# Patient Record
Sex: Female | Born: 1950 | Race: Black or African American | Hispanic: No | Marital: Married | Smoking: Former smoker
Health system: Southern US, Community
[De-identification: ages and names within clinical notes are randomized; demographics above are authoritative.]

## PROBLEM LIST (undated history)

## (undated) DIAGNOSIS — Z7901 Long term (current) use of anticoagulants: Secondary | ICD-10-CM

## (undated) DIAGNOSIS — J189 Pneumonia, unspecified organism: Secondary | ICD-10-CM

## (undated) DIAGNOSIS — E119 Type 2 diabetes mellitus without complications: Secondary | ICD-10-CM

## (undated) DIAGNOSIS — R58 Hemorrhage, not elsewhere classified: Secondary | ICD-10-CM

## (undated) DIAGNOSIS — J42 Unspecified chronic bronchitis: Secondary | ICD-10-CM

## (undated) DIAGNOSIS — T4145XA Adverse effect of unspecified anesthetic, initial encounter: Secondary | ICD-10-CM

## (undated) DIAGNOSIS — I10 Essential (primary) hypertension: Secondary | ICD-10-CM

## (undated) DIAGNOSIS — J40 Bronchitis, not specified as acute or chronic: Secondary | ICD-10-CM

## (undated) DIAGNOSIS — I4891 Unspecified atrial fibrillation: Secondary | ICD-10-CM

## (undated) DIAGNOSIS — E669 Obesity, unspecified: Secondary | ICD-10-CM

## (undated) DIAGNOSIS — F32A Depression, unspecified: Secondary | ICD-10-CM

## (undated) DIAGNOSIS — I5082 Biventricular heart failure: Secondary | ICD-10-CM

## (undated) DIAGNOSIS — N183 Chronic kidney disease, stage 3 unspecified: Secondary | ICD-10-CM

## (undated) DIAGNOSIS — R569 Unspecified convulsions: Secondary | ICD-10-CM

## (undated) DIAGNOSIS — K219 Gastro-esophageal reflux disease without esophagitis: Secondary | ICD-10-CM

## (undated) DIAGNOSIS — I639 Cerebral infarction, unspecified: Secondary | ICD-10-CM

## (undated) DIAGNOSIS — F439 Reaction to severe stress, unspecified: Secondary | ICD-10-CM

## (undated) DIAGNOSIS — B3731 Acute candidiasis of vulva and vagina: Secondary | ICD-10-CM

## (undated) DIAGNOSIS — G4733 Obstructive sleep apnea (adult) (pediatric): Secondary | ICD-10-CM

## (undated) DIAGNOSIS — D649 Anemia, unspecified: Secondary | ICD-10-CM

## (undated) DIAGNOSIS — I509 Heart failure, unspecified: Secondary | ICD-10-CM

## (undated) DIAGNOSIS — N184 Chronic kidney disease, stage 4 (severe): Secondary | ICD-10-CM

## (undated) DIAGNOSIS — Z9289 Personal history of other medical treatment: Secondary | ICD-10-CM

## (undated) DIAGNOSIS — R0602 Shortness of breath: Secondary | ICD-10-CM

## (undated) DIAGNOSIS — N200 Calculus of kidney: Secondary | ICD-10-CM

## (undated) DIAGNOSIS — F419 Anxiety disorder, unspecified: Secondary | ICD-10-CM

## (undated) DIAGNOSIS — L409 Psoriasis, unspecified: Secondary | ICD-10-CM

## (undated) DIAGNOSIS — I87309 Chronic venous hypertension (idiopathic) without complications of unspecified lower extremity: Secondary | ICD-10-CM

## (undated) DIAGNOSIS — I5030 Unspecified diastolic (congestive) heart failure: Secondary | ICD-10-CM

## (undated) DIAGNOSIS — B373 Candidiasis of vulva and vagina: Secondary | ICD-10-CM

## (undated) DIAGNOSIS — M109 Gout, unspecified: Secondary | ICD-10-CM

## (undated) DIAGNOSIS — F329 Major depressive disorder, single episode, unspecified: Secondary | ICD-10-CM

## (undated) DIAGNOSIS — T8859XA Other complications of anesthesia, initial encounter: Secondary | ICD-10-CM

## (undated) DIAGNOSIS — E785 Hyperlipidemia, unspecified: Secondary | ICD-10-CM

## (undated) DIAGNOSIS — Z9981 Dependence on supplemental oxygen: Secondary | ICD-10-CM

## (undated) DIAGNOSIS — M1990 Unspecified osteoarthritis, unspecified site: Secondary | ICD-10-CM

## (undated) HISTORY — PX: LUMBAR DISC SURGERY: SHX700

## (undated) HISTORY — PX: BACK SURGERY: SHX140

## (undated) HISTORY — DX: Obstructive sleep apnea (adult) (pediatric): G47.33

## (undated) HISTORY — DX: Acute candidiasis of vulva and vagina: B37.31

## (undated) HISTORY — DX: Reaction to severe stress, unspecified: F43.9

## (undated) HISTORY — DX: Chronic kidney disease, stage 3 unspecified: N18.30

## (undated) HISTORY — DX: Depression, unspecified: F32.A

## (undated) HISTORY — PX: JOINT REPLACEMENT: SHX530

## (undated) HISTORY — DX: Calculus of kidney: N20.0

## (undated) HISTORY — PX: TONSILLECTOMY: SUR1361

## (undated) HISTORY — PX: TOTAL ABDOMINAL HYSTERECTOMY: SHX209

## (undated) HISTORY — DX: Essential (primary) hypertension: I10

## (undated) HISTORY — DX: Anemia, unspecified: D64.9

## (undated) HISTORY — PX: APPENDECTOMY: SHX54

## (undated) HISTORY — DX: Cerebral infarction, unspecified: I63.9

## (undated) HISTORY — DX: Long term (current) use of anticoagulants: Z79.01

## (undated) HISTORY — DX: Psoriasis, unspecified: L40.9

## (undated) HISTORY — PX: CYSTECTOMY: SHX5119

## (undated) HISTORY — PX: CHOLECYSTECTOMY: SHX55

## (undated) HISTORY — DX: Biventricular heart failure: I50.82

## (undated) HISTORY — DX: Chronic venous hypertension (idiopathic) without complications of unspecified lower extremity: I87.309

## (undated) HISTORY — DX: Unspecified convulsions: R56.9

## (undated) HISTORY — DX: Obesity, unspecified: E66.9

## (undated) HISTORY — PX: TOTAL KNEE ARTHROPLASTY: SHX125

## (undated) HISTORY — DX: Chronic kidney disease, stage 3 (moderate): N18.3

## (undated) HISTORY — DX: Gout, unspecified: M10.9

## (undated) HISTORY — DX: Unspecified osteoarthritis, unspecified site: M19.90

## (undated) HISTORY — DX: Bronchitis, not specified as acute or chronic: J40

## (undated) HISTORY — DX: Unspecified atrial fibrillation: I48.91

## (undated) HISTORY — DX: Candidiasis of vulva and vagina: B37.3

## (undated) HISTORY — DX: Hyperlipidemia, unspecified: E78.5

## (undated) HISTORY — DX: Major depressive disorder, single episode, unspecified: F32.9

---

## 1998-02-03 ENCOUNTER — Ambulatory Visit (HOSPITAL_COMMUNITY): Admission: RE | Admit: 1998-02-03 | Discharge: 1998-02-03 | Payer: Self-pay | Admitting: Internal Medicine

## 1998-04-21 ENCOUNTER — Inpatient Hospital Stay (HOSPITAL_COMMUNITY): Admission: EM | Admit: 1998-04-21 | Discharge: 1998-04-28 | Payer: Self-pay | Admitting: *Deleted

## 1998-07-13 ENCOUNTER — Ambulatory Visit (HOSPITAL_COMMUNITY): Admission: RE | Admit: 1998-07-13 | Discharge: 1998-07-13 | Payer: Self-pay | Admitting: Internal Medicine

## 1998-10-16 ENCOUNTER — Ambulatory Visit (HOSPITAL_COMMUNITY): Admission: RE | Admit: 1998-10-16 | Discharge: 1998-10-16 | Payer: Self-pay | Admitting: Internal Medicine

## 1998-10-21 ENCOUNTER — Emergency Department (HOSPITAL_COMMUNITY): Admission: EM | Admit: 1998-10-21 | Discharge: 1998-10-21 | Payer: Self-pay | Admitting: Emergency Medicine

## 1999-01-15 ENCOUNTER — Ambulatory Visit (HOSPITAL_COMMUNITY): Admission: RE | Admit: 1999-01-15 | Discharge: 1999-01-15 | Payer: Self-pay | Admitting: Urology

## 1999-01-24 ENCOUNTER — Encounter: Payer: Self-pay | Admitting: Urology

## 1999-01-26 ENCOUNTER — Encounter: Payer: Self-pay | Admitting: Urology

## 1999-01-26 ENCOUNTER — Ambulatory Visit (HOSPITAL_COMMUNITY): Admission: RE | Admit: 1999-01-26 | Discharge: 1999-01-26 | Payer: Self-pay | Admitting: Urology

## 1999-02-12 ENCOUNTER — Encounter: Payer: Self-pay | Admitting: Urology

## 1999-02-12 ENCOUNTER — Ambulatory Visit (HOSPITAL_COMMUNITY): Admission: RE | Admit: 1999-02-12 | Discharge: 1999-02-12 | Payer: Self-pay | Admitting: Urology

## 1999-03-20 ENCOUNTER — Ambulatory Visit (HOSPITAL_COMMUNITY): Admission: RE | Admit: 1999-03-20 | Discharge: 1999-03-20 | Payer: Self-pay | Admitting: Emergency Medicine

## 1999-03-20 ENCOUNTER — Encounter: Payer: Self-pay | Admitting: Emergency Medicine

## 1999-03-22 ENCOUNTER — Ambulatory Visit (HOSPITAL_COMMUNITY): Admission: RE | Admit: 1999-03-22 | Discharge: 1999-03-22 | Payer: Self-pay | Admitting: Urology

## 1999-03-22 ENCOUNTER — Encounter: Payer: Self-pay | Admitting: Urology

## 1999-03-25 ENCOUNTER — Inpatient Hospital Stay (HOSPITAL_COMMUNITY): Admission: EM | Admit: 1999-03-25 | Discharge: 1999-03-27 | Payer: Self-pay | Admitting: Emergency Medicine

## 1999-03-25 ENCOUNTER — Encounter: Payer: Self-pay | Admitting: Urology

## 1999-03-26 ENCOUNTER — Encounter: Payer: Self-pay | Admitting: Urology

## 1999-05-17 ENCOUNTER — Ambulatory Visit (HOSPITAL_COMMUNITY): Admission: RE | Admit: 1999-05-17 | Discharge: 1999-05-17 | Payer: Self-pay | Admitting: Urology

## 1999-05-17 ENCOUNTER — Encounter: Payer: Self-pay | Admitting: Urology

## 1999-05-24 ENCOUNTER — Ambulatory Visit (HOSPITAL_COMMUNITY): Admission: RE | Admit: 1999-05-24 | Discharge: 1999-05-24 | Payer: Self-pay | Admitting: Urology

## 1999-05-24 ENCOUNTER — Encounter: Payer: Self-pay | Admitting: Urology

## 1999-06-04 ENCOUNTER — Encounter: Payer: Self-pay | Admitting: Orthopedic Surgery

## 1999-06-07 ENCOUNTER — Inpatient Hospital Stay (HOSPITAL_COMMUNITY): Admission: RE | Admit: 1999-06-07 | Discharge: 1999-06-11 | Payer: Self-pay | Admitting: Orthopedic Surgery

## 1999-06-11 ENCOUNTER — Inpatient Hospital Stay (HOSPITAL_COMMUNITY)
Admission: RE | Admit: 1999-06-11 | Discharge: 1999-06-20 | Payer: Self-pay | Admitting: Physical Medicine and Rehabilitation

## 1999-07-18 ENCOUNTER — Ambulatory Visit (HOSPITAL_COMMUNITY): Admission: RE | Admit: 1999-07-18 | Discharge: 1999-07-18 | Payer: Self-pay | Admitting: Orthopedic Surgery

## 1999-10-02 ENCOUNTER — Ambulatory Visit (HOSPITAL_COMMUNITY): Admission: RE | Admit: 1999-10-02 | Discharge: 1999-10-02 | Payer: Self-pay | Admitting: Orthopedic Surgery

## 1999-10-03 ENCOUNTER — Inpatient Hospital Stay (HOSPITAL_COMMUNITY): Admission: AD | Admit: 1999-10-03 | Discharge: 1999-10-10 | Payer: Self-pay | Admitting: Orthopedic Surgery

## 1999-10-03 ENCOUNTER — Encounter: Payer: Self-pay | Admitting: Orthopedic Surgery

## 1999-11-05 ENCOUNTER — Encounter: Admission: RE | Admit: 1999-11-05 | Discharge: 2000-01-25 | Payer: Self-pay | Admitting: Orthopedic Surgery

## 1999-11-13 ENCOUNTER — Encounter: Admission: RE | Admit: 1999-11-13 | Discharge: 1999-11-13 | Payer: Self-pay | Admitting: Internal Medicine

## 1999-12-18 ENCOUNTER — Encounter: Admission: RE | Admit: 1999-12-18 | Discharge: 1999-12-18 | Payer: Self-pay | Admitting: Internal Medicine

## 2000-01-07 ENCOUNTER — Encounter: Payer: Self-pay | Admitting: Urology

## 2000-01-07 ENCOUNTER — Encounter: Admission: RE | Admit: 2000-01-07 | Discharge: 2000-01-07 | Payer: Self-pay | Admitting: Urology

## 2000-01-15 ENCOUNTER — Encounter: Admission: RE | Admit: 2000-01-15 | Discharge: 2000-01-15 | Payer: Self-pay | Admitting: Internal Medicine

## 2000-01-22 ENCOUNTER — Encounter: Payer: Self-pay | Admitting: Internal Medicine

## 2000-01-22 ENCOUNTER — Ambulatory Visit (HOSPITAL_COMMUNITY): Admission: RE | Admit: 2000-01-22 | Discharge: 2000-01-22 | Payer: Self-pay | Admitting: Internal Medicine

## 2000-03-06 ENCOUNTER — Encounter: Admission: RE | Admit: 2000-03-06 | Discharge: 2000-06-04 | Payer: Self-pay | Admitting: Anesthesiology

## 2000-03-18 ENCOUNTER — Encounter: Admission: RE | Admit: 2000-03-18 | Discharge: 2000-03-18 | Payer: Self-pay | Admitting: Internal Medicine

## 2000-04-22 ENCOUNTER — Encounter (INDEPENDENT_AMBULATORY_CARE_PROVIDER_SITE_OTHER): Payer: Self-pay | Admitting: *Deleted

## 2000-04-22 ENCOUNTER — Ambulatory Visit (HOSPITAL_COMMUNITY): Admission: RE | Admit: 2000-04-22 | Discharge: 2000-04-22 | Payer: Self-pay | Admitting: Gastroenterology

## 2000-05-13 ENCOUNTER — Encounter: Admission: RE | Admit: 2000-05-13 | Discharge: 2000-05-13 | Payer: Self-pay | Admitting: Internal Medicine

## 2000-05-16 ENCOUNTER — Encounter: Payer: Self-pay | Admitting: Cardiovascular Disease

## 2000-05-16 ENCOUNTER — Ambulatory Visit (HOSPITAL_COMMUNITY): Admission: RE | Admit: 2000-05-16 | Discharge: 2000-05-16 | Payer: Self-pay | Admitting: Cardiovascular Disease

## 2000-05-18 ENCOUNTER — Emergency Department (HOSPITAL_COMMUNITY): Admission: EM | Admit: 2000-05-18 | Discharge: 2000-05-18 | Payer: Self-pay | Admitting: Internal Medicine

## 2000-05-18 ENCOUNTER — Encounter: Payer: Self-pay | Admitting: Emergency Medicine

## 2000-05-24 ENCOUNTER — Emergency Department (HOSPITAL_COMMUNITY): Admission: EM | Admit: 2000-05-24 | Discharge: 2000-05-24 | Payer: Self-pay | Admitting: Emergency Medicine

## 2000-05-24 ENCOUNTER — Encounter: Payer: Self-pay | Admitting: Emergency Medicine

## 2000-06-09 ENCOUNTER — Encounter: Admission: RE | Admit: 2000-06-09 | Discharge: 2000-06-09 | Payer: Self-pay | Admitting: Internal Medicine

## 2000-07-21 ENCOUNTER — Encounter: Admission: RE | Admit: 2000-07-21 | Discharge: 2000-07-21 | Payer: Self-pay | Admitting: Internal Medicine

## 2000-07-29 ENCOUNTER — Encounter: Admission: RE | Admit: 2000-07-29 | Discharge: 2000-07-29 | Payer: Self-pay | Admitting: Internal Medicine

## 2000-08-13 ENCOUNTER — Encounter: Admission: RE | Admit: 2000-08-13 | Discharge: 2000-08-13 | Payer: Self-pay | Admitting: Internal Medicine

## 2000-09-03 ENCOUNTER — Encounter: Admission: RE | Admit: 2000-09-03 | Discharge: 2000-09-03 | Payer: Self-pay | Admitting: Internal Medicine

## 2000-09-09 ENCOUNTER — Emergency Department (HOSPITAL_COMMUNITY): Admission: EM | Admit: 2000-09-09 | Discharge: 2000-09-09 | Payer: Self-pay | Admitting: Emergency Medicine

## 2000-09-30 ENCOUNTER — Inpatient Hospital Stay (HOSPITAL_COMMUNITY): Admission: RE | Admit: 2000-09-30 | Discharge: 2000-10-07 | Payer: Self-pay | Admitting: Orthopedic Surgery

## 2000-09-30 ENCOUNTER — Encounter (INDEPENDENT_AMBULATORY_CARE_PROVIDER_SITE_OTHER): Payer: Self-pay | Admitting: Specialist

## 2000-10-02 ENCOUNTER — Encounter: Payer: Self-pay | Admitting: Orthopedic Surgery

## 2000-10-07 ENCOUNTER — Inpatient Hospital Stay (HOSPITAL_COMMUNITY)
Admission: RE | Admit: 2000-10-07 | Discharge: 2000-10-14 | Payer: Self-pay | Admitting: Physical Medicine & Rehabilitation

## 2000-10-07 ENCOUNTER — Encounter: Payer: Self-pay | Admitting: Internal Medicine

## 2000-10-07 ENCOUNTER — Encounter: Payer: Self-pay | Admitting: Physical Medicine and Rehabilitation

## 2000-10-07 ENCOUNTER — Encounter (INDEPENDENT_AMBULATORY_CARE_PROVIDER_SITE_OTHER): Payer: Self-pay | Admitting: *Deleted

## 2000-10-13 ENCOUNTER — Encounter: Payer: Self-pay | Admitting: Physical Medicine & Rehabilitation

## 2000-11-18 ENCOUNTER — Encounter: Admission: RE | Admit: 2000-11-18 | Discharge: 2000-11-18 | Payer: Self-pay | Admitting: Internal Medicine

## 2001-01-20 ENCOUNTER — Encounter: Admission: RE | Admit: 2001-01-20 | Discharge: 2001-01-20 | Payer: Self-pay | Admitting: Internal Medicine

## 2001-01-22 ENCOUNTER — Ambulatory Visit (HOSPITAL_COMMUNITY): Admission: RE | Admit: 2001-01-22 | Discharge: 2001-01-22 | Payer: Self-pay | Admitting: Internal Medicine

## 2001-01-22 ENCOUNTER — Encounter: Payer: Self-pay | Admitting: Internal Medicine

## 2001-04-04 ENCOUNTER — Emergency Department (HOSPITAL_COMMUNITY): Admission: EM | Admit: 2001-04-04 | Discharge: 2001-04-04 | Payer: Self-pay

## 2001-07-01 ENCOUNTER — Encounter: Payer: Self-pay | Admitting: Urology

## 2001-07-01 ENCOUNTER — Encounter: Admission: RE | Admit: 2001-07-01 | Discharge: 2001-07-01 | Payer: Self-pay | Admitting: Urology

## 2001-07-08 ENCOUNTER — Encounter: Payer: Self-pay | Admitting: Orthopedic Surgery

## 2001-07-14 ENCOUNTER — Encounter (INDEPENDENT_AMBULATORY_CARE_PROVIDER_SITE_OTHER): Payer: Self-pay | Admitting: Specialist

## 2001-07-14 ENCOUNTER — Inpatient Hospital Stay (HOSPITAL_COMMUNITY): Admission: RE | Admit: 2001-07-14 | Discharge: 2001-07-21 | Payer: Self-pay | Admitting: Orthopedic Surgery

## 2001-08-19 ENCOUNTER — Encounter: Admission: RE | Admit: 2001-08-19 | Discharge: 2001-11-17 | Payer: Self-pay | Admitting: Orthopedic Surgery

## 2001-11-17 ENCOUNTER — Encounter: Payer: Self-pay | Admitting: Internal Medicine

## 2001-11-17 ENCOUNTER — Encounter: Admission: RE | Admit: 2001-11-17 | Discharge: 2001-11-17 | Payer: Self-pay | Admitting: Internal Medicine

## 2001-11-18 ENCOUNTER — Encounter: Admission: RE | Admit: 2001-11-18 | Discharge: 2001-11-19 | Payer: Self-pay | Admitting: Orthopedic Surgery

## 2002-01-22 ENCOUNTER — Encounter: Admission: RE | Admit: 2002-01-22 | Discharge: 2002-01-22 | Payer: Self-pay | Admitting: Internal Medicine

## 2002-01-22 ENCOUNTER — Encounter: Payer: Self-pay | Admitting: Internal Medicine

## 2002-01-26 ENCOUNTER — Ambulatory Visit (HOSPITAL_COMMUNITY): Admission: RE | Admit: 2002-01-26 | Discharge: 2002-01-26 | Payer: Self-pay | Admitting: Internal Medicine

## 2002-01-26 ENCOUNTER — Encounter: Payer: Self-pay | Admitting: Internal Medicine

## 2002-03-20 ENCOUNTER — Emergency Department (HOSPITAL_COMMUNITY): Admission: EM | Admit: 2002-03-20 | Discharge: 2002-03-20 | Payer: Self-pay

## 2002-06-13 ENCOUNTER — Emergency Department (HOSPITAL_COMMUNITY): Admission: EM | Admit: 2002-06-13 | Discharge: 2002-06-13 | Payer: Self-pay | Admitting: Emergency Medicine

## 2002-06-13 ENCOUNTER — Encounter: Payer: Self-pay | Admitting: Emergency Medicine

## 2002-10-13 ENCOUNTER — Encounter: Payer: Self-pay | Admitting: Emergency Medicine

## 2002-10-13 ENCOUNTER — Emergency Department (HOSPITAL_COMMUNITY): Admission: EM | Admit: 2002-10-13 | Discharge: 2002-10-13 | Payer: Self-pay | Admitting: Emergency Medicine

## 2002-10-20 ENCOUNTER — Inpatient Hospital Stay (HOSPITAL_COMMUNITY): Admission: AD | Admit: 2002-10-20 | Discharge: 2002-10-27 | Payer: Self-pay | Admitting: Internal Medicine

## 2002-10-21 ENCOUNTER — Encounter: Payer: Self-pay | Admitting: Internal Medicine

## 2002-10-21 ENCOUNTER — Encounter (INDEPENDENT_AMBULATORY_CARE_PROVIDER_SITE_OTHER): Payer: Self-pay | Admitting: Cardiovascular Disease

## 2002-10-22 ENCOUNTER — Encounter: Payer: Self-pay | Admitting: Internal Medicine

## 2002-11-20 ENCOUNTER — Emergency Department (HOSPITAL_COMMUNITY): Admission: EM | Admit: 2002-11-20 | Discharge: 2002-11-20 | Payer: Self-pay | Admitting: Emergency Medicine

## 2003-01-28 ENCOUNTER — Encounter: Payer: Self-pay | Admitting: Internal Medicine

## 2003-01-28 ENCOUNTER — Ambulatory Visit (HOSPITAL_COMMUNITY): Admission: RE | Admit: 2003-01-28 | Discharge: 2003-01-28 | Payer: Self-pay | Admitting: Internal Medicine

## 2003-03-23 ENCOUNTER — Inpatient Hospital Stay (HOSPITAL_COMMUNITY): Admission: AD | Admit: 2003-03-23 | Discharge: 2003-03-25 | Payer: Self-pay | Admitting: Internal Medicine

## 2003-05-09 ENCOUNTER — Encounter: Payer: Self-pay | Admitting: Internal Medicine

## 2003-05-09 ENCOUNTER — Ambulatory Visit (HOSPITAL_BASED_OUTPATIENT_CLINIC_OR_DEPARTMENT_OTHER): Admission: RE | Admit: 2003-05-09 | Discharge: 2003-05-09 | Payer: Self-pay | Admitting: Internal Medicine

## 2003-07-20 ENCOUNTER — Emergency Department (HOSPITAL_COMMUNITY): Admission: EM | Admit: 2003-07-20 | Discharge: 2003-07-20 | Payer: Self-pay | Admitting: Emergency Medicine

## 2003-07-20 ENCOUNTER — Encounter: Payer: Self-pay | Admitting: Emergency Medicine

## 2003-12-05 ENCOUNTER — Emergency Department (HOSPITAL_COMMUNITY): Admission: EM | Admit: 2003-12-05 | Discharge: 2003-12-05 | Payer: Self-pay | Admitting: Emergency Medicine

## 2004-01-28 ENCOUNTER — Emergency Department (HOSPITAL_COMMUNITY): Admission: EM | Admit: 2004-01-28 | Discharge: 2004-01-28 | Payer: Self-pay | Admitting: Emergency Medicine

## 2004-02-04 ENCOUNTER — Emergency Department (HOSPITAL_COMMUNITY): Admission: EM | Admit: 2004-02-04 | Discharge: 2004-02-04 | Payer: Self-pay | Admitting: Emergency Medicine

## 2004-02-13 ENCOUNTER — Ambulatory Visit (HOSPITAL_COMMUNITY): Admission: RE | Admit: 2004-02-13 | Discharge: 2004-02-13 | Payer: Self-pay | Admitting: Internal Medicine

## 2004-03-05 ENCOUNTER — Encounter: Admission: RE | Admit: 2004-03-05 | Discharge: 2004-03-05 | Payer: Self-pay | Admitting: Internal Medicine

## 2004-03-27 ENCOUNTER — Ambulatory Visit: Admission: RE | Admit: 2004-03-27 | Discharge: 2004-03-27 | Payer: Self-pay | Admitting: Cardiology

## 2004-03-27 ENCOUNTER — Encounter (INDEPENDENT_AMBULATORY_CARE_PROVIDER_SITE_OTHER): Payer: Self-pay | Admitting: Cardiology

## 2004-04-12 ENCOUNTER — Emergency Department (HOSPITAL_COMMUNITY): Admission: EM | Admit: 2004-04-12 | Discharge: 2004-04-12 | Payer: Self-pay | Admitting: *Deleted

## 2004-04-17 ENCOUNTER — Encounter: Admission: RE | Admit: 2004-04-17 | Discharge: 2004-04-17 | Payer: Self-pay | Admitting: Internal Medicine

## 2004-06-20 ENCOUNTER — Ambulatory Visit (HOSPITAL_COMMUNITY): Admission: RE | Admit: 2004-06-20 | Discharge: 2004-06-20 | Payer: Self-pay | Admitting: Cardiology

## 2004-09-18 ENCOUNTER — Inpatient Hospital Stay (HOSPITAL_BASED_OUTPATIENT_CLINIC_OR_DEPARTMENT_OTHER): Admission: RE | Admit: 2004-09-18 | Discharge: 2004-09-18 | Payer: Self-pay | Admitting: Cardiology

## 2004-12-26 ENCOUNTER — Encounter: Admission: RE | Admit: 2004-12-26 | Discharge: 2004-12-26 | Payer: Self-pay | Admitting: Internal Medicine

## 2005-01-03 ENCOUNTER — Emergency Department (HOSPITAL_COMMUNITY): Admission: EM | Admit: 2005-01-03 | Discharge: 2005-01-03 | Payer: Self-pay | Admitting: Emergency Medicine

## 2005-02-15 ENCOUNTER — Ambulatory Visit (HOSPITAL_COMMUNITY): Admission: RE | Admit: 2005-02-15 | Discharge: 2005-02-15 | Payer: Self-pay | Admitting: Internal Medicine

## 2005-04-30 ENCOUNTER — Encounter: Admission: RE | Admit: 2005-04-30 | Discharge: 2005-04-30 | Payer: Self-pay | Admitting: Internal Medicine

## 2005-06-15 ENCOUNTER — Emergency Department (HOSPITAL_COMMUNITY): Admission: EM | Admit: 2005-06-15 | Discharge: 2005-06-15 | Payer: Self-pay | Admitting: Emergency Medicine

## 2005-11-01 ENCOUNTER — Emergency Department (HOSPITAL_COMMUNITY): Admission: EM | Admit: 2005-11-01 | Discharge: 2005-11-01 | Payer: Self-pay | Admitting: Family Medicine

## 2005-11-06 ENCOUNTER — Emergency Department (HOSPITAL_COMMUNITY): Admission: EM | Admit: 2005-11-06 | Discharge: 2005-11-06 | Payer: Self-pay | Admitting: Emergency Medicine

## 2005-11-08 ENCOUNTER — Inpatient Hospital Stay (HOSPITAL_COMMUNITY): Admission: AD | Admit: 2005-11-08 | Discharge: 2005-11-13 | Payer: Self-pay | Admitting: Internal Medicine

## 2006-02-21 ENCOUNTER — Ambulatory Visit (HOSPITAL_COMMUNITY): Admission: RE | Admit: 2006-02-21 | Discharge: 2006-02-21 | Payer: Self-pay | Admitting: Internal Medicine

## 2006-05-12 ENCOUNTER — Ambulatory Visit (HOSPITAL_COMMUNITY): Admission: RE | Admit: 2006-05-12 | Discharge: 2006-05-12 | Payer: Self-pay

## 2006-05-12 ENCOUNTER — Encounter (INDEPENDENT_AMBULATORY_CARE_PROVIDER_SITE_OTHER): Payer: Self-pay | Admitting: Specialist

## 2006-07-02 ENCOUNTER — Emergency Department (HOSPITAL_COMMUNITY): Admission: EM | Admit: 2006-07-02 | Discharge: 2006-07-02 | Payer: Self-pay | Admitting: Family Medicine

## 2006-08-21 ENCOUNTER — Encounter: Admission: RE | Admit: 2006-08-21 | Discharge: 2006-08-21 | Payer: Self-pay | Admitting: Internal Medicine

## 2006-10-28 ENCOUNTER — Inpatient Hospital Stay (HOSPITAL_COMMUNITY): Admission: RE | Admit: 2006-10-28 | Discharge: 2006-11-03 | Payer: Self-pay | Admitting: Neurosurgery

## 2007-02-23 ENCOUNTER — Ambulatory Visit (HOSPITAL_COMMUNITY): Admission: RE | Admit: 2007-02-23 | Discharge: 2007-02-23 | Payer: Self-pay | Admitting: Internal Medicine

## 2007-07-24 ENCOUNTER — Encounter: Admission: RE | Admit: 2007-07-24 | Discharge: 2007-07-24 | Payer: Self-pay | Admitting: Neurology

## 2008-02-24 ENCOUNTER — Ambulatory Visit (HOSPITAL_COMMUNITY): Admission: RE | Admit: 2008-02-24 | Discharge: 2008-02-24 | Payer: Self-pay | Admitting: Internal Medicine

## 2008-08-31 ENCOUNTER — Encounter: Admission: RE | Admit: 2008-08-31 | Discharge: 2008-08-31 | Payer: Self-pay | Admitting: Internal Medicine

## 2009-01-01 ENCOUNTER — Emergency Department (HOSPITAL_COMMUNITY): Admission: EM | Admit: 2009-01-01 | Discharge: 2009-01-01 | Payer: Self-pay | Admitting: Emergency Medicine

## 2009-02-24 ENCOUNTER — Ambulatory Visit (HOSPITAL_COMMUNITY): Admission: RE | Admit: 2009-02-24 | Discharge: 2009-02-24 | Payer: Self-pay | Admitting: Internal Medicine

## 2009-05-03 ENCOUNTER — Encounter (HOSPITAL_COMMUNITY): Admission: RE | Admit: 2009-05-03 | Discharge: 2009-07-12 | Payer: Self-pay | Admitting: Cardiology

## 2009-06-23 ENCOUNTER — Emergency Department (HOSPITAL_COMMUNITY): Admission: EM | Admit: 2009-06-23 | Discharge: 2009-06-23 | Payer: Self-pay | Admitting: Emergency Medicine

## 2009-10-08 ENCOUNTER — Emergency Department (HOSPITAL_COMMUNITY): Admission: EM | Admit: 2009-10-08 | Discharge: 2009-10-08 | Payer: Self-pay | Admitting: Emergency Medicine

## 2010-01-08 ENCOUNTER — Observation Stay (HOSPITAL_COMMUNITY): Admission: EM | Admit: 2010-01-08 | Discharge: 2010-01-08 | Payer: Self-pay | Admitting: Emergency Medicine

## 2010-01-15 ENCOUNTER — Emergency Department (HOSPITAL_COMMUNITY): Admission: EM | Admit: 2010-01-15 | Discharge: 2010-01-16 | Payer: Self-pay | Admitting: Emergency Medicine

## 2010-01-15 ENCOUNTER — Encounter: Payer: Self-pay | Admitting: Internal Medicine

## 2010-02-09 ENCOUNTER — Encounter: Payer: Self-pay | Admitting: Internal Medicine

## 2010-03-15 ENCOUNTER — Ambulatory Visit: Payer: Self-pay | Admitting: Internal Medicine

## 2010-03-15 DIAGNOSIS — J42 Unspecified chronic bronchitis: Secondary | ICD-10-CM | POA: Insufficient documentation

## 2010-03-15 DIAGNOSIS — G4733 Obstructive sleep apnea (adult) (pediatric): Secondary | ICD-10-CM

## 2010-03-16 ENCOUNTER — Ambulatory Visit (HOSPITAL_COMMUNITY): Admission: RE | Admit: 2010-03-16 | Discharge: 2010-03-16 | Payer: Self-pay | Admitting: Internal Medicine

## 2010-03-18 DIAGNOSIS — E785 Hyperlipidemia, unspecified: Secondary | ICD-10-CM

## 2010-03-18 DIAGNOSIS — J45909 Unspecified asthma, uncomplicated: Secondary | ICD-10-CM | POA: Insufficient documentation

## 2010-03-18 DIAGNOSIS — I10 Essential (primary) hypertension: Secondary | ICD-10-CM | POA: Insufficient documentation

## 2010-03-18 DIAGNOSIS — I6789 Other cerebrovascular disease: Secondary | ICD-10-CM

## 2010-03-22 ENCOUNTER — Encounter: Payer: Self-pay | Admitting: Internal Medicine

## 2010-03-23 ENCOUNTER — Encounter: Payer: Self-pay | Admitting: Internal Medicine

## 2010-03-28 ENCOUNTER — Encounter: Payer: Self-pay | Admitting: Internal Medicine

## 2010-04-09 ENCOUNTER — Encounter: Payer: Self-pay | Admitting: Internal Medicine

## 2010-04-20 ENCOUNTER — Ambulatory Visit: Payer: Self-pay | Admitting: Internal Medicine

## 2010-04-20 DIAGNOSIS — E662 Morbid (severe) obesity with alveolar hypoventilation: Secondary | ICD-10-CM

## 2010-05-28 ENCOUNTER — Inpatient Hospital Stay (HOSPITAL_COMMUNITY): Admission: EM | Admit: 2010-05-28 | Discharge: 2010-06-05 | Payer: Self-pay | Admitting: Emergency Medicine

## 2010-05-29 ENCOUNTER — Encounter (INDEPENDENT_AMBULATORY_CARE_PROVIDER_SITE_OTHER): Payer: Self-pay | Admitting: Internal Medicine

## 2010-08-03 ENCOUNTER — Encounter: Payer: Self-pay | Admitting: Internal Medicine

## 2010-08-03 ENCOUNTER — Ambulatory Visit: Payer: Self-pay | Admitting: Internal Medicine

## 2010-08-03 ENCOUNTER — Inpatient Hospital Stay (HOSPITAL_COMMUNITY): Admission: EM | Admit: 2010-08-03 | Discharge: 2010-08-21 | Payer: Self-pay | Admitting: Emergency Medicine

## 2010-10-17 ENCOUNTER — Ambulatory Visit: Payer: Self-pay | Admitting: Internal Medicine

## 2010-10-17 DIAGNOSIS — I4891 Unspecified atrial fibrillation: Secondary | ICD-10-CM

## 2010-10-17 DIAGNOSIS — I5041 Acute combined systolic (congestive) and diastolic (congestive) heart failure: Secondary | ICD-10-CM

## 2010-10-26 ENCOUNTER — Encounter: Payer: Self-pay | Admitting: Internal Medicine

## 2010-11-02 ENCOUNTER — Telehealth: Payer: Self-pay | Admitting: Internal Medicine

## 2010-11-09 ENCOUNTER — Encounter: Admission: RE | Admit: 2010-11-09 | Discharge: 2010-11-09 | Payer: Self-pay | Admitting: Internal Medicine

## 2010-11-22 ENCOUNTER — Ambulatory Visit: Payer: Self-pay | Admitting: Internal Medicine

## 2011-01-08 NOTE — Letter (Signed)
Summary: Consult/[facility]  [facility]   Imported By: Sherian Rein 03/21/2010 10:26:28  _____________________________________________________________________  External Attachment:    Type:   Image     Comment:   External Document

## 2011-01-08 NOTE — Letter (Signed)
Summary: CMN for CPAP & Supplies/[facility]  CMN for CPAP & Supplies/[facility]   Imported By: Sherian Rein 04/11/2010 11:01:15  _____________________________________________________________________  External Attachment:    Type:   Image     Comment:   External Document

## 2011-01-08 NOTE — Assessment & Plan Note (Signed)
Summary: 1 MONTH W/PFT/APC   Primary Provider/Referring Provider:  Willey Blade  CC:  1 month follow up visit-Review PFT results today. [name]  History of Present Illness: History of Present Illness: 03/23/2010- 59 yoF, previously a patient at [facility] for obstructive sleep apnea, . Comes now for pulmonary evaluation. She has had several ER visits for cough, and had allergy evaluation by Dr [place] Callas, with recommendation that she see a pulmonologist.  She reports itching and watering of eyes, drainage and sneeze, but skin testing was reportedly negative. She was prescribed Astepro, Xyzal, Singulair, Xopenex HFA , albuterol by neb, and omeprazole. She says she wasn't able to afford most of this. CXR in ER was negative. She is aware of dx asthma/ bronchitis. NPSG 04/11/03- moderate OSA, AHI 17.9. She was fitted with CPAP 7, but uses it intermittently.  Apr 20, 2010- OSA, Asthma/ bronchitis She has CPAP at 7 used every night, but nocturia gets in the way and sometimes she doesn't replace cpap after returning to bed. It seems to be helping and she will keep trying  PFT - moderate restriction c/w obesity hypoventilation. There is a little response to bronchodilator in small airways. DLCO is reduced- nonspecific. Room air sat today 93%. CXR- 01/15/10- , clear.      Current Medications (verified): 1)  Crestor 20 Mg Tabs (Rosuvastatin Calcium) .... Take 1 By Mouth Once Daily 2)  Potassium Chloride 20 Meq Pack (Potassium Chloride) .... Take 1 By Mouth Once Daily 3)  Potassium Citrate 10 Meq (1080 Mg) Cr-Tabs (Potassium Citrate) .... Take 1 By Mouth Three Times A Day 4)  Furosemide 40 Mg Tabs (Furosemide) .... Take 1 By Mouth Once Daily 5)  Methotrexate 2.5 Mg Tabs (Methotrexate Sodium) .... Take 4 By Mouth Every Week 6)  Zyprexa 5 Mg Tabs (Olanzapine) .... Take 1 By Mouth At Bedtime 7)  Benicar Hct 40-25 Mg Tabs (Olmesartan Medoxomil-Hctz) .... Take 1 By Mouth Once Daily 8)  Folic Acid 1 Mg Tabs (Folic Acid)  .... Take 1 By Mouth Once Daily 9)  Aspirin 81 Mg Tbec (Aspirin) .... Take 1 By Mouth Once Daily 10)  Isosorbide Mononitrate Cr 120 Mg Xr24h-Tab (Isosorbide Mononitrate) .... Take 1 By Mouth Once Daily 11)  Metoprolol Succinate 100 Mg Xr24h-Tab (Metoprolol Succinate) .... Take 1/2 By Mouth Two Times A Day 12)  Tribenzor 40-5-25 Mg Tabs (Olmesartan-Amlodipine-Hctz) .... Take 1 By Mouth Once Daily  Allergies (verified): 1)  ! Sulfa 2)  ! Codeine 3)  ! Pcn 4)  ! * Emycin 5)  ! * Latex 6)  ! Prozac 7)  ! Vioxx 8)  ! 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 Hypertension Obstructive Sleep Apnea- NPSG 04/11/03 AHI 17.9/hr Degenerative joint disease Hx kidney stones depression latex allergy - hx- undefined Hyperlipidemia  Past Surgical History: Last updated: 2010-03-23 Appendectomy Cholecystectomy Total Knee Arthroplasty- left Total Abdominal Hysterectomy Back surgery Left forearm repair - infected trauma cyst left hand  Family History: Last updated: 03/23/2010 Father-deceased from massive MI, asthma Uncle Deceased from heart disease Aunt -rectal cancer Mother- died MVA  Social History: Last updated: 03-23-10 Married with children, 7 grands, 1 great-grand disabled- 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  The patient  denies anorexia, fever, weight loss, weight gain, vision loss, decreased hearing, hoarseness, chest pain, syncope, peripheral edema, prolonged cough, headaches, hemoptysis, abdominal pain, melena, and severe indigestion/heartburn.    Vital Signs:  Patient profile:   60 year old female Height:      63 inches Weight:      299 pounds BMI:     53.16 O2 Sat:      93 % on Room air Pulse rate:   81 / minute BP  sitting:   122 / 78  (right arm) Cuff size:   large  Vitals Entered By: Reynaldo Minium CMA (Apr 20, 2010 2:25 PM)  O2 Flow:  Room air CC: 1 month follow up visit-Review PFT results today.  Comments BP taken on Right wrist.Katie Welchel CMA  Apr 20, 2010 2:25 PM    Physical Exam  Additional Exam:  General: A/Ox3; pleasant and cooperative, NAD,  morbidly obese SKIN: no rash, lesions NODES: no lymphadenopathy HEENT: [place]/AT, EOM- WNL, Conjuctivae- clear, PERRLA, TM-WNL, Nose- clear, Throat- clear and wnl, Mallampati  III NECK: Supple w/ fair ROM, JVD- none, normal carotid impulses w/o bruits Thyroid- normal to palpation, no stridor CHEST: Clear to P&A, diminished HEART: RRR, no m/g/r heard ABDOMEN: Soft and nl;  ZOX:WRUE, nl pulses, 1-2+ edema , pretibial stasis changes. Scar left forearm NEURO: Grossly intact to observation      Impression & Recommendations:  Problem # 1:  OBSTRUCTIVE SLEEP APNEA (ICD-327.23)  She needs ongoing encouragement to be compliant with CPAP, especially with nocturia getting her up at night., the basic set-up seems effective.  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 I tried to be encouraging.  There may be enough of a bronchospasm to justify occasional use of a bronchodilator spray, but it isn't evident now.  Medications Added to Medication List This Visit: 1)  Tribenzor 40-5-25 Mg Tabs (Olmesartan-amlodipine-hctz) .... Take 1 by mouth once daily 2)  Cpap 7 [facility]   Other Orders: Est. Patient Level III (45409)  Patient Instructions: 1)  Please schedule a follow-up appointment in 6 months. 2)  Keep trying to use your CPAP all night, every night 3)  It is really important for you to lose weight so your lungs have more room to fill with air. You will feel much better.

## 2011-01-08 NOTE — Assessment & Plan Note (Signed)
Summary: asthma/apc   Primary Provider/Referring Provider:  Willey Blade  CC:  Pulmonary consult-Dr.Sharma.  History of Present Illness: 29-Mar-2010- 59 yoF, previously a patient at [facility] for obstructive sleep apnea, . Comes now for pulmonary evaluation. She has had several ER visits for cough, and had allergy evaluation by Dr [place] Callas, with recommendation that she see a pulmonologist.  she reports itching and watering of eyes, drainage and sneeze, but skin testing was reportedly negative. She was prescribed Astepro, Xyzal, Singulair, Xopenex HFA , albuterol by neb, and omeprazole. She says she wasn't able to afford most of this. CXR in ER was negative. She is aware of dx asthma/ bronchitis. NPSG 04/11/03- moderate OSA, AHI 17.9. She was fitted with CPAP 7, but uses it intermittently.  Allergies (verified): 1)  ! Sulfa 2)  ! Codeine 3)  ! Pcn 4)  ! * Emycin 5)  ! 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  The patient denies productive cough, coughing up blood, chest pain, loss of appetite, weight change, abdominal pain, sore throat, headaches, itching, ear ache, change in color of mucus, and fever.         Sleeps during day whenever she is quiet Nocturia  Vital Signs:  Patient profile:   60 year old female Height:      63 inches Weight:      299 pounds BMI:     53.16 O2 Sat:      96 % on Room air Pulse rate:   62 / minute BP sitting:   140 / 88  (left arm) Cuff size:   large  Vitals Entered By: Reynaldo Minium CMA 03-29-2010 10:15 AM)  O2 Flow:  Room air  Physical Exam  Additional Exam:  General: A/Ox3; pleasant and cooperative, NAD, obese SKIN: no rash, lesions NODES: no lymphadenopathy HEENT: [place]/AT, EOM- WNL, Conjuctivae- clear, PERRLA, TM-WNL, Nose- clear, Throat- clear and wnl, Mallampati  III NECK: Supple w/ fair ROM, JVD- none, normal carotid impulses w/o bruits Thyroid- normal to palpation, no stridor CHEST: Clear to P&A, diminished HEART: RRR, no m/g/r heard ABDOMEN: Soft and nl;  FTD:DUKG, nl pulses, 1-2+ edema , pretibial stasis changes. Scar left forearm NEURO: Grossly intact to observation  Impression & Recommendations:  Problem # 1:  BRONCHITIS, RECURRENT (ICD-491.9)  We will get PFT, find report of most recent CXR and have her return bringing meds so we can review these. She is encouraged to be more active.  Problem # 2:  OBSTRUCTIVE SLEEP APNEA (ICD-327.23)  She had mild to moderate OSA in  2004 and has tried to use CPAP but admits inconsistency. Nocturia and daytime somnolence may relate to this problem. We will autotitrate for pressure check and get [facility] to look at her machine. She realizes weight loss is important.  Other Orders: Consultation Level III (16109) DME Referral (DME)  Patient Instructions: 1)  Please schedule a follow-up appointment in 1 month. When you come back, please bring all your medicines in a bag so we can go over them. 2)  See [facility] to arrange autotitration of your CPAP machine to check pressure 3)  Walk as much as you can to help stamina and weight loss. When you do sit, try to elevate your feet to help with the swelling. 4)  Schedule PFT

## 2011-01-08 NOTE — Assessment & Plan Note (Signed)
Summary: rov 6 months///kp   Primary Macallister Ashmead/Referring Carylon Tamburro:  Willey Blade  CC:  6 mth rov - sob worse for 2 weeks - diff sleeping - wears cpap until 2 amd then takes it off - swelling about the same.  History of Present Illness: March 15, 2010- 59 yoF, previously a patient at [facility] for obstructive sleep apnea, . Comes now for pulmonary evaluation. She has had several ER visits for cough, and had allergy evaluation by Dr [place] Callas, with recommendation that she see a pulmonologist.  She reports itching and watering of eyes, drainage and sneeze, but skin testing was reportedly negative. She was prescribed Astepro, Xyzal, Singulair, Xopenex HFA , albuterol by neb, and omeprazole. She says she wasn't able to afford most of this. CXR in ER was negative. She is aware of dx asthma/ bronchitis. NPSG 04/11/03- moderate OSA, AHI 17.9. She was fitted with CPAP 7, but uses it intermittently.  Apr 20, 2010- OSA, Asthma/ bronchitis She has CPAP at 7 used every night, but nocturia gets in the way and sometimes she doesn't replace cpap after returning to bed. It seems to be helping and she will keep trying  PFT - moderate restriction c/w obesity hypoventilation. There is a little response to bronchodilator in small airways. DLCO is reduced- nonspecific. Room air sat today 93%. CXR- 01/15/10- , clear.  October 17, 2010- OSA, Asthma/ bronchitis, daistolic CHF, Atrial flutter/ fib Nurse-CC: 6 mth rov - sob worse for 2 weeks - diff sleeping - wears cpap until 2 amd then takes it off - swelling about the same Falls asleep w/ CPAP but cant sleep after up to bathroom so sits up. CPAP 7 [facility]. Hosp in August for acute respiratory failure, diastolic heart failure, atrial flutter. Better initially but breathing "up and down" since. Dr Sharyn Lull- cards.  More SOB with some dry cough, 2 weeks. Denies chest pain. Occ palpitation. Leg swelling "about same".  CT 08/03/10- CE, coronary calcification, suspected elevated right heart  pressures, borderline mediastinal adenopathy.   Asthma History    Initial Asthma Severity Rating:    Age range: 12+ years    Symptoms: throughout the day    Nighttime Awakenings: >1/week but not nightly    Interferes w/ normal activity: some limitations    SABA use (not for EIB): several times per day    Asthma Severity Assessment: Severe Persistent   Preventive Screening-Counseling & Management  Alcohol-Tobacco     Smoking Status: quit  Current Medications (verified): 1)  Crestor 20 Mg Tabs (Rosuvastatin Calcium) .... Take 1 By Mouth Once Daily 2)  Potassium Citrate 10 Meq (1080 Mg) Cr-Tabs (Potassium Citrate) .... Take 1 By Mouth Three Times A Day 3)  Furosemide 40 Mg Tabs (Furosemide) .... Take 1and 1/2 By Mouth Once Daily 4)  Methotrexate 2.5 Mg Tabs (Methotrexate Sodium) .... Take 4 By Mouth Every Week 5)  Zyprexa 5 Mg Tabs (Olanzapine) .... Take 1 By Mouth At Bedtime 6)  Metoprolol Tartrate 50 Mg Tabs (Metoprolol Tartrate) .... Take One By Mouth Three Times A Day 7)  Cpap 7 [facility] 8)  Taztia Xt 360 Mg Xr24h-Cap (Diltiazem Hcl Er Beads) .... Take One By Mouth Once Daily 9)  Spironolactone 25 Mg Tabs (Spironolactone) .... Take One By Mouth Once Daily 10)  Zolpidem Tartrate 5 Mg Tabs (Zolpidem Tartrate) .... Take One By Mouth At Bedtime  As Needed 11)  Warfarin Sodium 4 Mg Tabs (Warfarin Sodium) .... Take 2 Tab By Mouth Once Daily 12)  Loratadine  10 Mg Tabs (Loratadine) .... Take One By Mouth Once Daily 13)  Promethazine Hcl 12.5 Mg Tabs (Promethazine Hcl) .... Take One By Mouth Every 8 Hrs As Needed Nausea 14)  Vitamin D3 50000 Unit Caps (Cholecalciferol) .... Take One By Mouth Once A Week 15)  Flovent Hfa 220 Mcg/act Aero (Fluticasone Propionate  Hfa) .... Take 2 Puffs Three Times A Day 16)  Ventolin Hfa 108 (90 Base) Mcg/act Aers (Albuterol Sulfate) .... Take 2 Puffs Every 4 Hrs As Needed  Allergies (verified): 1)  ! Sulfa 2)  ! Codeine 3)  ! Pcn 4)  ! * Emycin 5)  ! *  Latex 6)  ! Prozac 7)  ! Vioxx 8)  ! 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 Hypertension Obstructive Sleep Apnea- NPSG 04/11/03 AHI 17.9/hr Degenerative joint disease Hx kidney stones depression latex allergy - hx- undefined Hyperlipidemia  Past Surgical History: Last updated: 25-Mar-2010 Appendectomy Cholecystectomy Total Knee Arthroplasty- left Total Abdominal Hysterectomy Back surgery Left forearm repair - infected trauma cyst left hand  Family History: Last updated: 03/25/2010 Father-deceased from massive MI, asthma Uncle Deceased from heart disease Aunt -rectal cancer Mother- died MVA  Social History: Last updated: 10/17/2010 Married with children, 7 grands, 1 great-grand disabled- 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  The patient denies anorexia, fever, weight loss, weight gain, vision loss, decreased hearing, hoarseness, chest pain, syncope, prolonged cough, headaches, hemoptysis, abdominal pain, abnormal bleeding, and enlarged lymph nodes.    Vital Signs:  Patient profile:   60 year old female Height:      63 inches Weight:      310.25 pounds BMI:     55.16 O2 Sat:      94 % on Room air Pulse rate:   136 / minute BP sitting:   150 / 90  (left arm) Cuff size:   large  Vitals Entered By: Abigail Miyamoto RN (October 17, 2010 11:11 AM)  O2 Flow:  Room air  Physical Exam  Additional Exam:  General: A/Ox3; pleasant and  cooperative, NAD,  morbidly obese SKIN: no rash, lesions NODES: no lymphadenopathy HEENT: [place]/AT, EOM- WNL, Conjuctivae- clear, PERRLA, TM-WNL, Nose- clear, Throat- clear and wnl, Mallampati  III NECK: Supple w/ fair ROM, JVD- none, normal carotid impulses w/o bruits Thyroid- normal to palpation, no stridor CHEST: Clear to P&A, diminished, shallow, labored, no cough or wheeze HEART: rapid rhythm, approx regular 136/min ABDOMEN: Soft and nl; morbidly obese ZOX:WRUE, nl pulses,3+edema , pretibial stasis changes. Scar left forearm NEURO: Grossly intact to observation      Impression & Recommendations:  Problem # 1:  OBSTRUCTIVE SLEEP APNEA (ICD-327.23) Atrial fib w/ RVR @ 136/min. Room air sat is 94%.  She is dyspneic from heart and that is limiting CPAP tolerance. We will check ONOX and see if home O2 could help for sleep.   Problem # 2:  ATRIAL FIBRILLATION WITH RAPID VENTRICULAR RESPONSE (ICD-427.31) Heart rate  of 136 indicates inadequate control, especially since she was just in the hospital. We are calling to see if we can get her over to Dr Sharyn Lull for cardiology assessment.  The following medications were removed from the medication list:    Aspirin 81 Mg Tbec (Aspirin) .[name]... Take 1 by mouth once daily Her updated medication list for this problem includes:    Metoprolol Tartrate 50 Mg Tabs (Metoprolol tartrate) .[name]... Take one by mouth three times a day    Warfarin Sodium 4 Mg Tabs (Warfarin sodium) .[name]... Take 2 tab by mouth once daily  Medications Added to Medication List This Visit: 1)  Furosemide 40 Mg Tabs (Furosemide) .... Take 1and 1/2 by mouth once daily 2)  Metoprolol Tartrate 50 Mg Tabs (Metoprolol tartrate) .... Take one by mouth three times a day 3)  Taztia Xt 360 Mg Xr24h-cap (Diltiazem hcl er beads) .... Take one by mouth once daily 4)  Spironolactone 25 Mg Tabs (Spironolactone) .... Take one by mouth once daily 5)  Zolpidem Tartrate 5 Mg Tabs (Zolpidem tartrate) ....  Take one by mouth at bedtime  as needed 6)  Warfarin Sodium 4 Mg Tabs (Warfarin sodium) .... Take 2 tab by mouth once daily 7)  Loratadine 10 Mg Tabs (Loratadine) .... Take one by mouth once daily 8)  Promethazine Hcl 12.5 Mg Tabs (Promethazine hcl) .... Take one by mouth every 8 hrs as needed nausea 9)  Vitamin D3 50000 Unit Caps (Cholecalciferol) .... Take one by mouth once a week 10)  Flovent Hfa 220 Mcg/act Aero (Fluticasone propionate  hfa) .... Take 2 puffs three times a day 11)  Ventolin Hfa 108 (90 Base) Mcg/act Aers (Albuterol sulfate) .... Take 2 puffs every 4 hrs as needed  Other Orders: Est. Patient Level IV (16109) DME Referral (DME)  Patient Instructions: 1)  Please schedule a follow-up appointment in 1 month. 2)  Early appointment with Dr Sharyn Lull for atrial fib with rapid response at 136 3)  We will have [facility] set up an overnight oximetry on CPAP    Influenza Immunization History:    Influenza # 1:  Historical (10/10/2010)

## 2011-01-08 NOTE — Progress Notes (Signed)
Summary: ONOX CPAP/ Room Air OK= no home O2  Phone Note Other Incoming   Summary of Call: ONOX- CPAP/ room air 10/25/10- Oxygenation within accepted range. Time w/ sat </= 88% for 2 minutes, 44 seconds. She doesn't qualify for home oxygen during sleep. Initial call taken by: Waymon Budge MD,  November 02, 2010 6:42 PM

## 2011-01-08 NOTE — Letter (Signed)
Summary: CMN for CPAP Supplies/[facility]  CMN for CPAP Supplies/[facility]   Imported By: Sherian Rein 04/11/2010 10:58:57  _____________________________________________________________________  External Attachment:    Type:   Image     Comment:   External Document

## 2011-01-08 NOTE — Miscellaneous (Signed)
Summary: Orders Update pft charges  Clinical Lists Changes  Orders: Added new Service order of Carbon Monoxide diffusing w/capacity (94720) - Signed Added new Service order of Lung Volumes (94240) - Signed Added new Service order of Spirometry (Pre & Post) (94060) - Signed 

## 2011-01-08 NOTE — Miscellaneous (Signed)
Summary: Order Confirmation for CPAP/[facility]  Order Confirmation for CPAP/[facility]   Imported By: Sherian Rein 03/29/2010 14:30:09  _____________________________________________________________________  External Attachment:    Type:   Image     Comment:   External Document

## 2011-01-08 NOTE — Miscellaneous (Signed)
Summary: Update med list/allergies/kcw  Clinical Lists Changes  Medications: Added new medication of CRESTOR 20 MG TABS (ROSUVASTATIN CALCIUM) take 1 by mouth once daily Added new medication of POTASSIUM CHLORIDE 20 MEQ PACK (POTASSIUM CHLORIDE) take 1 by mouth once daily Added new medication of POTASSIUM CITRATE 10 MEQ (1080 MG) CR-TABS (POTASSIUM CITRATE) take 1 by mouth three times a day Added new medication of FUROSEMIDE 40 MG TABS (FUROSEMIDE) take 1 by mouth once daily Added new medication of METHOTREXATE 2.5 MG TABS (METHOTREXATE SODIUM) take 4 by mouth every week Added new medication of FELODIPINE 10 MG XR24H-TAB (FELODIPINE) take 1 by mouth once daily Added new medication of TEKTURNA 150 MG TABS (ALISKIREN FUMARATE) take 2 by mouth once daily Added new medication of ZYPREXA 5 MG TABS (OLANZAPINE) take 1 by mouth at bedtime Added new medication of BENICAR HCT 40-25 MG TABS (OLMESARTAN MEDOXOMIL-HCTZ) take 1 by mouth once daily Added new medication of FOLIC ACID 1 MG TABS (FOLIC ACID) take 1 by mouth once daily Added new medication of ASPIRIN 81 MG TBEC (ASPIRIN) take 1 by mouth once daily Added new medication of ISOSORBIDE MONONITRATE CR 120 MG XR24H-TAB (ISOSORBIDE MONONITRATE) take 1 by mouth once daily Added new medication of METOPROLOL SUCCINATE 100 MG XR24H-TAB (METOPROLOL SUCCINATE) take 1/2 by mouth two times a day  Appended Document: Update med list/allergies/kcw    Clinical Lists Changes  Allergies: Added new allergy or adverse reaction of PROZAC Added new allergy or adverse reaction of VIOXX Added new allergy or adverse reaction of CELEBREX

## 2011-01-08 NOTE — Procedures (Signed)
Summary: Oximetry/[facility]  Oximetry/[facility]   Imported By: Sherian Rein 11/08/2010 08:22:44  _____________________________________________________________________  External Attachment:    Type:   Image     Comment:   External Document

## 2011-01-10 NOTE — Assessment & Plan Note (Signed)
Summary: 1 mth rov/ddp   Primary Provider/Referring Provider:  Willey Blade  CC:  1 month follow up visit-OSA using CPAP each night and SOB.[name]  History of Present Illness: Apr 20, 2010- OSA, Asthma/ bronchitis She has CPAP at 7 used every night, but nocturia gets in the way and sometimes she doesn't replace cpap after returning to bed. It seems to be helping and she will keep trying  PFT - moderate restriction c/w obesity hypoventilation. There is a little response to bronchodilator in small airways. DLCO is reduced- nonspecific. Room air sat today 93%. CXR- 01/15/10- , clear.  October 17, 2010- OSA, Asthma/ bronchitis, daistolic CHF, Atrial flutter/ fib Nurse-CC: 6 mth rov - sob worse for 2 weeks - diff sleeping - wears cpap until 2 amd then takes it off - swelling about the same Falls asleep w/ CPAP but cant sleep after up to bathroom so sits up. CPAP 7 [employer]. Hosp in August for acute respiratory failure, diastolic heart failure, atrial flutter. Better initially but breathing "up and down" since. Dr Sharyn Lull- cards.  More SOB with some dry cough, 2 weeks. Denies chest pain. Occ palpitation. Leg swelling "about same".  CT 08/03/10- CE, coronary calcification, suspected elevated right heart pressures, borderline mediastinal adenopathy.  November 22, 2010- OSA, Asthma/ bronchitis, daistolic CHF, Atrial flutter/ fib Nurse-CC: 1 month follow up visit-OSA using CPAP each night and SOB. She is doing well with CPAP, using every night at 7 and says she feels better. Husband doesn't use his CPAP- his snoring drones her to sleep.  Asthma control ok- no wheeze but admits dyspnea with exertion on sustained walking.       Asthma History    Asthma Control Assessment:    Age range: 12+ years    Symptoms: 0-2 days/week    Nighttime Awakenings: 0-2/month    Interferes w/ normal activity: no limitations    SABA use (not for EIB): >2 days/week    Asthma Control Assessment: Not Well  Controlled   Preventive Screening-Counseling & Management  Alcohol-Tobacco     Smoking Status: quit     Packs/Day: <0.25     Year Quit: 1986  Current Medications (verified): 1)  Crestor 20 Mg Tabs (Rosuvastatin Calcium) .... Take 1 By Mouth Once Daily 2)  Potassium Citrate 10 Meq (1080 Mg) Cr-Tabs (Potassium Citrate) .... Take 1 By Mouth Three Times A Day 3)  Furosemide 40 Mg Tabs (Furosemide) .... Take 1and 1/2 By Mouth Once Daily 4)  Methotrexate 2.5 Mg Tabs (Methotrexate Sodium) .... Take 4 By Mouth Every Week 5)  Zyprexa 5 Mg Tabs (Olanzapine) .... Take 1 By Mouth At Bedtime 6)  Metoprolol Tartrate 50 Mg Tabs (Metoprolol Tartrate) .... Take One By Mouth Three Times A Day 7)  Cpap 7 [employer] 8)  Taztia Xt 360 Mg Xr24h-Cap (Diltiazem Hcl Er Beads) .... Take One By Mouth Once Daily 9)  Spironolactone 25 Mg Tabs (Spironolactone) .... Take One By Mouth Once Daily 10)  Zolpidem Tartrate 5 Mg Tabs (Zolpidem Tartrate) .... Take One By Mouth At Bedtime  As Needed 11)  Warfarin Sodium 4 Mg Tabs (Warfarin Sodium) .... Take 2 Tab By Mouth Once Daily 12)  Loratadine 10 Mg Tabs (Loratadine) .... Take One By Mouth Once Daily 13)  Promethazine Hcl 12.5 Mg Tabs (Promethazine Hcl) .... Take One By Mouth Every 8 Hrs As Needed Nausea 14)  Vitamin D3 50000 Unit Caps (Cholecalciferol) .... Take One By Mouth Once A Week 15)  Flovent Hfa 220 Mcg/act  Aero (Fluticasone Propionate  Hfa) .... Take 2 Puffs Three Times A Day 16)  Ventolin Hfa 108 (90 Base) Mcg/act Aers (Albuterol Sulfate) .... Take 2 Puffs Every 4 Hrs As Needed  Allergies (verified): 1)  ! Sulfa 2)  ! Codeine 3)  ! Pcn 4)  ! * Emycin 5)  ! * Latex 6)  ! Prozac 7)  ! Vioxx 8)  ! 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 Hypertension Obstructive Sleep Apnea- NPSG 04/11/03 AHI 17.9/hr Degenerative joint disease Hx kidney  stones depression latex allergy - hx- undefined Hyperlipidemia  Past Surgical History: Last updated: 03-25-10 Appendectomy Cholecystectomy Total Knee Arthroplasty- left Total Abdominal Hysterectomy Back surgery Left forearm repair - infected trauma cyst left hand  Family History: Last updated: 03-25-10 Father-deceased from massive MI, asthma Uncle Deceased from heart disease Aunt -rectal cancer Mother- died MVA  Social History: Last updated: 11/22/2010 Married with children, 7 grands, 1 great-grand disabled- 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  The patient denies shortness of breath at rest, productive cough, non-productive cough, coughing up blood, chest pain, acid heartburn, indigestion, loss of appetite, weight change, abdominal pain, difficulty swallowing, sore throat, tooth/dental problems, headaches, nasal congestion/difficulty breathing through nose, and sneezing.    Vital Signs:  Patient profile:   60 year old female Height:      63 inches Weight:      303.38 pounds BMI:     53.94 O2 Sat:      91 % on Room air Pulse rate:   70 / minute BP sitting:   146 / 62  (right arm) Cuff size:   large  Vitals Entered By: Reynaldo Minium CMA (November 22, 2010 4:29 PM)  O2 Flow:  Room air CC: 1 month follow up visit-OSA using CPAP each night and SOB.   Physical Exam  Additional Exam:  General: A/Ox3; pleasant and cooperative, NAD,  morbidly obese, O2 sat 91% at rest.  SKIN: no rash, lesions NODES:  no lymphadenopathy HEENT: [place]/AT, EOM- WNL, Conjuctivae- clear, PERRLA, TM-WNL, Nose- clear, Throat- clear and wnl, Mallampati  III NECK: Supple w/ fair ROM, JVD- none, normal carotid impulses w/o bruits Thyroid- normal to palpation, no stridor CHEST: Clear to P&A, diminished, shallow, unlabored, no cough or wheeze, no rales HEART: regular today 70/ min, no murmur ABDOMEN: Soft and nl; morbidly obese ZOX:WRUE, nl pulses,3+edema , pretibial stasis changes. Scar left forearm NEURO: Grossly intact to observation      Impression & Recommendations:  Problem # 1:  OBSTRUCTIVE SLEEP APNEA (ICD-327.23)  Good compliance and control with CPAP. She is doing well. Answered questions about mask replacement and humidifier.  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 Will try to encourage her.   Problem # 3:  ATRIAL FIBRILLATION WITH RAPID VENTRICULAR RESPONSE (ICD-427.31) Cardiac status certainly contributes to exercise limitation, but she is in sinus  rhythm today.  Her updated medication list for this problem includes:    Metoprolol Tartrate 50 Mg Tabs (Metoprolol tartrate) .[name]... Take one by mouth three times a day    Warfarin Sodium 4 Mg Tabs (Warfarin sodium) .[name]... Take 2 tab by mouth once daily  Other Orders: Est. Patient Level IV (40102)  Patient Instructions: 1)  Please schedule a follow-up appointment in 4 months. 2)  Continue CPAP at 7.

## 2011-02-14 ENCOUNTER — Other Ambulatory Visit: Payer: Self-pay | Admitting: Internal Medicine

## 2011-02-14 DIAGNOSIS — Z1231 Encounter for screening mammogram for malignant neoplasm of breast: Secondary | ICD-10-CM

## 2011-02-21 LAB — GLUCOSE, CAPILLARY
Glucose-Capillary: 113 mg/dL — ABNORMAL HIGH (ref 70–99)
Glucose-Capillary: 113 mg/dL — ABNORMAL HIGH (ref 70–99)
Glucose-Capillary: 114 mg/dL — ABNORMAL HIGH (ref 70–99)
Glucose-Capillary: 118 mg/dL — ABNORMAL HIGH (ref 70–99)
Glucose-Capillary: 121 mg/dL — ABNORMAL HIGH (ref 70–99)
Glucose-Capillary: 122 mg/dL — ABNORMAL HIGH (ref 70–99)
Glucose-Capillary: 123 mg/dL — ABNORMAL HIGH (ref 70–99)
Glucose-Capillary: 133 mg/dL — ABNORMAL HIGH (ref 70–99)
Glucose-Capillary: 137 mg/dL — ABNORMAL HIGH (ref 70–99)
Glucose-Capillary: 140 mg/dL — ABNORMAL HIGH (ref 70–99)
Glucose-Capillary: 142 mg/dL — ABNORMAL HIGH (ref 70–99)
Glucose-Capillary: 143 mg/dL — ABNORMAL HIGH (ref 70–99)
Glucose-Capillary: 144 mg/dL — ABNORMAL HIGH (ref 70–99)
Glucose-Capillary: 149 mg/dL — ABNORMAL HIGH (ref 70–99)
Glucose-Capillary: 155 mg/dL — ABNORMAL HIGH (ref 70–99)
Glucose-Capillary: 157 mg/dL — ABNORMAL HIGH (ref 70–99)
Glucose-Capillary: 158 mg/dL — ABNORMAL HIGH (ref 70–99)
Glucose-Capillary: 159 mg/dL — ABNORMAL HIGH (ref 70–99)
Glucose-Capillary: 163 mg/dL — ABNORMAL HIGH (ref 70–99)
Glucose-Capillary: 163 mg/dL — ABNORMAL HIGH (ref 70–99)
Glucose-Capillary: 164 mg/dL — ABNORMAL HIGH (ref 70–99)
Glucose-Capillary: 167 mg/dL — ABNORMAL HIGH (ref 70–99)
Glucose-Capillary: 170 mg/dL — ABNORMAL HIGH (ref 70–99)
Glucose-Capillary: 186 mg/dL — ABNORMAL HIGH (ref 70–99)
Glucose-Capillary: 192 mg/dL — ABNORMAL HIGH (ref 70–99)
Glucose-Capillary: 201 mg/dL — ABNORMAL HIGH (ref 70–99)

## 2011-02-21 LAB — COMPREHENSIVE METABOLIC PANEL
ALT: 25 U/L (ref 0–35)
ALT: 25 U/L (ref 0–35)
AST: 24 U/L (ref 0–37)
Albumin: 3.6 g/dL (ref 3.5–5.2)
Alkaline Phosphatase: 82 U/L (ref 39–117)
BUN: 42 mg/dL — ABNORMAL HIGH (ref 6–23)
BUN: 44 mg/dL — ABNORMAL HIGH (ref 6–23)
BUN: 46 mg/dL — ABNORMAL HIGH (ref 6–23)
CO2: 27 mEq/L (ref 19–32)
CO2: 27 mEq/L (ref 19–32)
CO2: 30 mEq/L (ref 19–32)
Calcium: 9 mg/dL (ref 8.4–10.5)
Calcium: 9.1 mg/dL (ref 8.4–10.5)
Calcium: 9.7 mg/dL (ref 8.4–10.5)
Chloride: 104 mEq/L (ref 96–112)
Creatinine, Ser: 1.81 mg/dL — ABNORMAL HIGH (ref 0.4–1.2)
Creatinine, Ser: 1.84 mg/dL — ABNORMAL HIGH (ref 0.4–1.2)
Creatinine, Ser: 1.96 mg/dL — ABNORMAL HIGH (ref 0.4–1.2)
GFR calc Af Amer: 34 mL/min — ABNORMAL LOW (ref >60)
GFR calc non Af Amer: 27 mL/min — ABNORMAL LOW (ref >60)
GFR calc non Af Amer: 28 mL/min — ABNORMAL LOW (ref >60)
GFR calc non Af Amer: 29 mL/min — ABNORMAL LOW (ref >60)
Glucose, Bld: 141 mg/dL — ABNORMAL HIGH (ref 70–99)
Glucose, Bld: 144 mg/dL — ABNORMAL HIGH (ref 70–99)
Glucose, Bld: 163 mg/dL — ABNORMAL HIGH (ref 70–99)
Potassium: 4.6 mEq/L (ref 3.5–5.1)
Sodium: 140 mEq/L (ref 135–145)
Total Bilirubin: 0.4 mg/dL (ref 0.3–1.2)
Total Protein: 7.3 g/dL (ref 6.0–8.3)
Total Protein: 7.5 g/dL (ref 6.0–8.3)
Total Protein: 7.8 g/dL (ref 6.0–8.3)

## 2011-02-21 LAB — CBC
HCT: 32.3 % — ABNORMAL LOW (ref 36.0–46.0)
HCT: 33 % — ABNORMAL LOW (ref 36.0–46.0)
HCT: 37.1 % (ref 36.0–46.0)
Hemoglobin: 10.6 g/dL — ABNORMAL LOW (ref 12.0–15.0)
Hemoglobin: 12 g/dL (ref 12.0–15.0)
MCH: 28.4 pg (ref 26.0–34.0)
MCH: 28.6 pg (ref 26.0–34.0)
MCH: 28.9 pg (ref 26.0–34.0)
MCHC: 32.4 g/dL (ref 30.0–36.0)
MCHC: 32.7 g/dL (ref 30.0–36.0)
MCHC: 32.8 g/dL (ref 30.0–36.0)
MCHC: 32.9 g/dL (ref 30.0–36.0)
MCHC: 33 g/dL (ref 30.0–36.0)
MCV: 86.8 fL (ref 78.0–100.0)
MCV: 87.9 fL (ref 78.0–100.0)
Platelets: 260 10*3/uL (ref 150–400)
Platelets: 291 10*3/uL (ref 150–400)
RBC: 4.16 MIL/uL (ref 3.87–5.11)
RDW: 18.8 % — ABNORMAL HIGH (ref 11.5–15.5)
RDW: 19 % — ABNORMAL HIGH (ref 11.5–15.5)
RDW: 19 % — ABNORMAL HIGH (ref 11.5–15.5)
RDW: 19.4 % — ABNORMAL HIGH (ref 11.5–15.5)
WBC: 5.7 10*3/uL (ref 4.0–10.5)

## 2011-02-21 LAB — HEPARIN LEVEL (UNFRACTIONATED): Heparin Unfractionated: 0.36 IU/mL (ref 0.30–0.70)

## 2011-02-21 LAB — BASIC METABOLIC PANEL
BUN: 32 mg/dL — ABNORMAL HIGH (ref 6–23)
BUN: 33 mg/dL — ABNORMAL HIGH (ref 6–23)
CO2: 29 mEq/L (ref 19–32)
CO2: 30 mEq/L (ref 19–32)
CO2: 31 mEq/L (ref 19–32)
Calcium: 8.7 mg/dL (ref 8.4–10.5)
Calcium: 9.1 mg/dL (ref 8.4–10.5)
Calcium: 9.2 mg/dL (ref 8.4–10.5)
Chloride: 106 mEq/L (ref 96–112)
Creatinine, Ser: 1.44 mg/dL — ABNORMAL HIGH (ref 0.4–1.2)
GFR calc Af Amer: 44 mL/min — ABNORMAL LOW (ref >60)
GFR calc Af Amer: 45 mL/min — ABNORMAL LOW (ref >60)
GFR calc Af Amer: 45 mL/min — ABNORMAL LOW (ref >60)
GFR calc non Af Amer: 33 mL/min — ABNORMAL LOW (ref >60)
Glucose, Bld: 123 mg/dL — ABNORMAL HIGH (ref 70–99)
Glucose, Bld: 125 mg/dL — ABNORMAL HIGH (ref 70–99)
Glucose, Bld: 139 mg/dL — ABNORMAL HIGH (ref 70–99)
Potassium: 4.8 mEq/L (ref 3.5–5.1)
Potassium: 5.6 mEq/L — ABNORMAL HIGH (ref 3.5–5.1)
Sodium: 142 mEq/L (ref 135–145)
Sodium: 142 mEq/L (ref 135–145)

## 2011-02-21 LAB — PROTIME-INR
INR: 1.73 — ABNORMAL HIGH (ref 0.00–1.49)
INR: 1.82 — ABNORMAL HIGH (ref 0.00–1.49)
INR: 2.12 — ABNORMAL HIGH (ref 0.00–1.49)
INR: 2.24 — ABNORMAL HIGH (ref 0.00–1.49)
Prothrombin Time: 19.4 seconds — ABNORMAL HIGH (ref 11.6–15.2)
Prothrombin Time: 20.4 seconds — ABNORMAL HIGH (ref 11.6–15.2)
Prothrombin Time: 21.2 seconds — ABNORMAL HIGH (ref 11.6–15.2)
Prothrombin Time: 22.1 seconds — ABNORMAL HIGH (ref 11.6–15.2)
Prothrombin Time: 22.6 seconds — ABNORMAL HIGH (ref 11.6–15.2)
Prothrombin Time: 24.7 seconds — ABNORMAL HIGH (ref 11.6–15.2)

## 2011-02-21 LAB — BRAIN NATRIURETIC PEPTIDE
Pro B Natriuretic peptide (BNP): 184 pg/mL — ABNORMAL HIGH (ref 0.0–100.0)
Pro B Natriuretic peptide (BNP): 75.8 pg/mL (ref 0.0–100.0)

## 2011-02-21 LAB — AMMONIA: Ammonia: 21 umol/L (ref 11–35)

## 2011-02-22 LAB — BASIC METABOLIC PANEL
CO2: 27 mEq/L (ref 19–32)
Calcium: 8.1 mg/dL — ABNORMAL LOW (ref 8.4–10.5)
Calcium: 8.2 mg/dL — ABNORMAL LOW (ref 8.4–10.5)
Calcium: 8.2 mg/dL — ABNORMAL LOW (ref 8.4–10.5)
Chloride: 109 mEq/L (ref 96–112)
Creatinine, Ser: 1.47 mg/dL — ABNORMAL HIGH (ref 0.4–1.2)
Creatinine, Ser: 1.58 mg/dL — ABNORMAL HIGH (ref 0.4–1.2)
GFR calc Af Amer: 37 mL/min — ABNORMAL LOW (ref >60)
GFR calc Af Amer: 44 mL/min — ABNORMAL LOW (ref >60)
GFR calc non Af Amer: 31 mL/min — ABNORMAL LOW (ref >60)
GFR calc non Af Amer: 33 mL/min — ABNORMAL LOW (ref >60)
Glucose, Bld: 115 mg/dL — ABNORMAL HIGH (ref 70–99)
Glucose, Bld: 131 mg/dL — ABNORMAL HIGH (ref 70–99)
Glucose, Bld: 133 mg/dL — ABNORMAL HIGH (ref 70–99)
Potassium: 4 mEq/L (ref 3.5–5.1)
Sodium: 138 mEq/L (ref 135–145)
Sodium: 141 mEq/L (ref 135–145)

## 2011-02-22 LAB — CBC
Hemoglobin: 10.2 g/dL — ABNORMAL LOW (ref 12.0–15.0)
MCH: 28.5 pg (ref 26.0–34.0)
MCH: 29 pg (ref 26.0–34.0)
MCHC: 32.8 g/dL (ref 30.0–36.0)
MCHC: 32.8 g/dL (ref 30.0–36.0)
MCV: 87.1 fL (ref 78.0–100.0)
MCV: 88.3 fL (ref 78.0–100.0)
Platelets: 239 10*3/uL (ref 150–400)
Platelets: 240 10*3/uL (ref 150–400)
Platelets: 241 10*3/uL (ref 150–400)
RBC: 3.5 MIL/uL — ABNORMAL LOW (ref 3.87–5.11)
RDW: 18.9 % — ABNORMAL HIGH (ref 11.5–15.5)
RDW: 19.2 % — ABNORMAL HIGH (ref 11.5–15.5)
WBC: 6.3 10*3/uL (ref 4.0–10.5)

## 2011-02-22 LAB — COMPREHENSIVE METABOLIC PANEL
ALT: 18 U/L (ref 0–35)
ALT: 19 U/L (ref 0–35)
AST: 16 U/L (ref 0–37)
Albumin: 3.3 g/dL — ABNORMAL LOW (ref 3.5–5.2)
Alkaline Phosphatase: 72 U/L (ref 39–117)
BUN: 17 mg/dL (ref 6–23)
CO2: 26 mEq/L (ref 19–32)
CO2: 26 mEq/L (ref 19–32)
Chloride: 108 mEq/L (ref 96–112)
Chloride: 110 mEq/L (ref 96–112)
Creatinine, Ser: 1.4 mg/dL — ABNORMAL HIGH (ref 0.4–1.2)
GFR calc Af Amer: 47 mL/min — ABNORMAL LOW (ref >60)
GFR calc non Af Amer: 39 mL/min — ABNORMAL LOW (ref >60)
Glucose, Bld: 108 mg/dL — ABNORMAL HIGH (ref 70–99)
Glucose, Bld: 124 mg/dL — ABNORMAL HIGH (ref 70–99)
Potassium: 3.8 mEq/L (ref 3.5–5.1)
Potassium: 4.1 mEq/L (ref 3.5–5.1)
Sodium: 139 mEq/L (ref 135–145)
Sodium: 142 mEq/L (ref 135–145)
Total Bilirubin: 0.4 mg/dL (ref 0.3–1.2)
Total Bilirubin: 0.4 mg/dL (ref 0.3–1.2)
Total Protein: 6.7 g/dL (ref 6.0–8.3)
Total Protein: 7 g/dL (ref 6.0–8.3)

## 2011-02-22 LAB — GLUCOSE, CAPILLARY
Glucose-Capillary: 102 mg/dL — ABNORMAL HIGH (ref 70–99)
Glucose-Capillary: 106 mg/dL — ABNORMAL HIGH (ref 70–99)
Glucose-Capillary: 111 mg/dL — ABNORMAL HIGH (ref 70–99)
Glucose-Capillary: 111 mg/dL — ABNORMAL HIGH (ref 70–99)
Glucose-Capillary: 125 mg/dL — ABNORMAL HIGH (ref 70–99)
Glucose-Capillary: 126 mg/dL — ABNORMAL HIGH (ref 70–99)
Glucose-Capillary: 140 mg/dL — ABNORMAL HIGH (ref 70–99)
Glucose-Capillary: 164 mg/dL — ABNORMAL HIGH (ref 70–99)
Glucose-Capillary: 165 mg/dL — ABNORMAL HIGH (ref 70–99)
Glucose-Capillary: 166 mg/dL — ABNORMAL HIGH (ref 70–99)
Glucose-Capillary: 95 mg/dL (ref 70–99)
Glucose-Capillary: 98 mg/dL (ref 70–99)

## 2011-02-22 LAB — HEPARIN LEVEL (UNFRACTIONATED)
Heparin Unfractionated: 0.35 IU/mL (ref 0.30–0.70)
Heparin Unfractionated: 0.43 IU/mL (ref 0.30–0.70)
Heparin Unfractionated: 0.57 IU/mL (ref 0.30–0.70)
Heparin Unfractionated: 0.62 IU/mL (ref 0.30–0.70)
Heparin Unfractionated: 0.68 IU/mL (ref 0.30–0.70)

## 2011-02-22 LAB — PROTIME-INR
INR: 1.87 — ABNORMAL HIGH (ref 0.00–1.49)
INR: 5.63 (ref 0.00–1.49)
Prothrombin Time: 19 seconds — ABNORMAL HIGH (ref 11.6–15.2)
Prothrombin Time: 21.7 seconds — ABNORMAL HIGH (ref 11.6–15.2)
Prothrombin Time: 50.6 seconds — ABNORMAL HIGH (ref 11.6–15.2)

## 2011-02-22 LAB — CARDIAC PANEL(CRET KIN+CKTOT+MB+TROPI)
Relative Index: INVALID (ref 0.0–2.5)
Relative Index: INVALID (ref 0.0–2.5)
Total CK: 41 U/L (ref 7–177)
Troponin I: 0.02 ng/mL (ref 0.00–0.06)
Troponin I: 0.03 ng/mL (ref 0.00–0.06)

## 2011-02-22 LAB — DIFFERENTIAL
Basophils Absolute: 0 10*3/uL (ref 0.0–0.1)
Basophils Relative: 0 % (ref 0–1)
Lymphocytes Relative: 14 % (ref 12–46)
Lymphs Abs: 0.9 10*3/uL (ref 0.7–4.0)
Monocytes Absolute: 0.2 10*3/uL (ref 0.1–1.0)
Monocytes Relative: 3 % (ref 3–12)
Monocytes Relative: 5 % (ref 3–12)
Neutro Abs: 4.1 10*3/uL (ref 1.7–7.7)
Neutro Abs: 5.1 10*3/uL (ref 1.7–7.7)
Neutrophils Relative %: 76 % (ref 43–77)

## 2011-02-22 LAB — POCT CARDIAC MARKERS
CKMB, poc: <1 ng/mL — ABNORMAL LOW (ref 1.0–8.0)
Myoglobin, poc: 95.2 ng/mL (ref 12–200)

## 2011-02-22 LAB — APTT: aPTT: 190 seconds — ABNORMAL HIGH (ref 24–37)

## 2011-02-22 LAB — HEMOGLOBIN A1C: Hgb A1c MFr Bld: 7.2 % — ABNORMAL HIGH (ref <5.7)

## 2011-02-22 LAB — MAGNESIUM: Magnesium: 2 mg/dL (ref 1.5–2.5)

## 2011-02-22 LAB — PHOSPHORUS: Phosphorus: 3.9 mg/dL (ref 2.3–4.6)

## 2011-02-24 LAB — BASIC METABOLIC PANEL
GFR calc non Af Amer: 33 mL/min — ABNORMAL LOW (ref >60)
Glucose, Bld: 148 mg/dL — ABNORMAL HIGH (ref 70–99)
Potassium: 3.9 mEq/L (ref 3.5–5.1)
Sodium: 141 mEq/L (ref 135–145)

## 2011-02-24 LAB — POCT CARDIAC MARKERS
CKMB, poc: 1.5 ng/mL (ref 1.0–8.0)
Troponin i, poc: <0.05 ng/mL (ref 0.00–0.09)

## 2011-02-24 LAB — CBC
HCT: 37.4 % (ref 36.0–46.0)
Hemoglobin: 12.2 g/dL (ref 12.0–15.0)
WBC: 6.3 10*3/uL (ref 4.0–10.5)

## 2011-02-24 LAB — BRAIN NATRIURETIC PEPTIDE: Pro B Natriuretic peptide (BNP): 83 pg/mL (ref 0.0–100.0)

## 2011-02-25 LAB — CBC
HCT: 32.1 % — ABNORMAL LOW (ref 36.0–46.0)
Hemoglobin: 10.2 g/dL — ABNORMAL LOW (ref 12.0–15.0)
Hemoglobin: 10.6 g/dL — ABNORMAL LOW (ref 12.0–15.0)
Hemoglobin: 10.9 g/dL — ABNORMAL LOW (ref 12.0–15.0)
MCH: 29.4 pg (ref 26.0–34.0)
MCHC: 31.8 g/dL (ref 30.0–36.0)
MCHC: 33 g/dL (ref 30.0–36.0)
MCHC: 33.8 g/dL (ref 30.0–36.0)
MCV: 87.8 fL (ref 78.0–100.0)
MCV: 88.9 fL (ref 78.0–100.0)
MCV: 89.2 fL (ref 78.0–100.0)
Platelets: 175 10*3/uL (ref 150–400)
Platelets: 198 10*3/uL (ref 150–400)
Platelets: 203 10*3/uL (ref 150–400)
Platelets: 205 10*3/uL (ref 150–400)
Platelets: 206 10*3/uL (ref 150–400)
RBC: 3.52 MIL/uL — ABNORMAL LOW (ref 3.87–5.11)
RBC: 3.63 MIL/uL — ABNORMAL LOW (ref 3.87–5.11)
RBC: 3.64 MIL/uL — ABNORMAL LOW (ref 3.87–5.11)
RBC: 3.69 MIL/uL — ABNORMAL LOW (ref 3.87–5.11)
RBC: 3.69 MIL/uL — ABNORMAL LOW (ref 3.87–5.11)
RBC: 3.71 MIL/uL — ABNORMAL LOW (ref 3.87–5.11)
RDW: 18.5 % — ABNORMAL HIGH (ref 11.5–15.5)
RDW: 18.9 % — ABNORMAL HIGH (ref 11.5–15.5)
RDW: 19 % — ABNORMAL HIGH (ref 11.5–15.5)
WBC: 5 10*3/uL (ref 4.0–10.5)
WBC: 5.3 10*3/uL (ref 4.0–10.5)
WBC: 5.5 10*3/uL (ref 4.0–10.5)
WBC: 5.9 10*3/uL (ref 4.0–10.5)
WBC: 6.2 10*3/uL (ref 4.0–10.5)
WBC: 6.9 10*3/uL (ref 4.0–10.5)

## 2011-02-25 LAB — POCT CARDIAC MARKERS: Myoglobin, poc: 193 ng/mL (ref 12–200)

## 2011-02-25 LAB — BRAIN NATRIURETIC PEPTIDE: Pro B Natriuretic peptide (BNP): 133 pg/mL — ABNORMAL HIGH (ref 0.0–100.0)

## 2011-02-25 LAB — COMPREHENSIVE METABOLIC PANEL
ALT: 28 U/L (ref 0–35)
ALT: 34 U/L (ref 0–35)
ALT: 38 U/L — ABNORMAL HIGH (ref 0–35)
AST: 21 U/L (ref 0–37)
AST: 24 U/L (ref 0–37)
Albumin: 2.9 g/dL — ABNORMAL LOW (ref 3.5–5.2)
Albumin: 3.1 g/dL — ABNORMAL LOW (ref 3.5–5.2)
Albumin: 3.2 g/dL — ABNORMAL LOW (ref 3.5–5.2)
Alkaline Phosphatase: 73 U/L (ref 39–117)
BUN: 22 mg/dL (ref 6–23)
BUN: 23 mg/dL (ref 6–23)
CO2: 28 mEq/L (ref 19–32)
CO2: 30 mEq/L (ref 19–32)
CO2: 31 mEq/L (ref 19–32)
Calcium: 8.2 mg/dL — ABNORMAL LOW (ref 8.4–10.5)
Calcium: 8.3 mg/dL — ABNORMAL LOW (ref 8.4–10.5)
Calcium: 8.6 mg/dL (ref 8.4–10.5)
Chloride: 105 mEq/L (ref 96–112)
Chloride: 105 mEq/L (ref 96–112)
Chloride: 107 mEq/L (ref 96–112)
Creatinine, Ser: 1.61 mg/dL — ABNORMAL HIGH (ref 0.4–1.2)
Creatinine, Ser: 1.66 mg/dL — ABNORMAL HIGH (ref 0.4–1.2)
Creatinine, Ser: 1.86 mg/dL — ABNORMAL HIGH (ref 0.4–1.2)
GFR calc Af Amer: 38 mL/min — ABNORMAL LOW (ref >60)
GFR calc Af Amer: 42 mL/min — ABNORMAL LOW (ref >60)
GFR calc non Af Amer: 28 mL/min — ABNORMAL LOW (ref >60)
GFR calc non Af Amer: 32 mL/min — ABNORMAL LOW (ref >60)
GFR calc non Af Amer: 33 mL/min — ABNORMAL LOW (ref >60)
GFR calc non Af Amer: 34 mL/min — ABNORMAL LOW (ref >60)
Glucose, Bld: 128 mg/dL — ABNORMAL HIGH (ref 70–99)
Potassium: 4.9 mEq/L (ref 3.5–5.1)
Sodium: 141 mEq/L (ref 135–145)
Sodium: 143 mEq/L (ref 135–145)
Total Bilirubin: 0.6 mg/dL (ref 0.3–1.2)
Total Bilirubin: 0.6 mg/dL (ref 0.3–1.2)
Total Bilirubin: 0.6 mg/dL (ref 0.3–1.2)
Total Protein: 6.2 g/dL (ref 6.0–8.3)
Total Protein: 6.4 g/dL (ref 6.0–8.3)

## 2011-02-25 LAB — CARDIAC PANEL(CRET KIN+CKTOT+MB+TROPI)
CK, MB: 0.8 ng/mL (ref 0.3–4.0)
CK, MB: 0.8 ng/mL (ref 0.3–4.0)
CK, MB: 0.9 ng/mL (ref 0.3–4.0)
Relative Index: INVALID (ref 0.0–2.5)
Relative Index: INVALID (ref 0.0–2.5)
Total CK: 48 U/L (ref 7–177)
Total CK: 48 U/L (ref 7–177)
Total CK: 55 U/L (ref 7–177)

## 2011-02-25 LAB — GLUCOSE, CAPILLARY
Glucose-Capillary: 132 mg/dL — ABNORMAL HIGH (ref 70–99)
Glucose-Capillary: 136 mg/dL — ABNORMAL HIGH (ref 70–99)
Glucose-Capillary: 146 mg/dL — ABNORMAL HIGH (ref 70–99)
Glucose-Capillary: 160 mg/dL — ABNORMAL HIGH (ref 70–99)
Glucose-Capillary: 166 mg/dL — ABNORMAL HIGH (ref 70–99)
Glucose-Capillary: 170 mg/dL — ABNORMAL HIGH (ref 70–99)
Glucose-Capillary: 173 mg/dL — ABNORMAL HIGH (ref 70–99)
Glucose-Capillary: 182 mg/dL — ABNORMAL HIGH (ref 70–99)
Glucose-Capillary: 228 mg/dL — ABNORMAL HIGH (ref 70–99)

## 2011-02-25 LAB — PROTEIN ELECTROPH W RFLX QUANT IMMUNOGLOBULINS
Beta 2: 5.3 % (ref 3.2–6.5)
Gamma Globulin: 13.2 % (ref 11.1–18.8)
M-Spike, %: NOT DETECTED g/dL

## 2011-02-25 LAB — RETICULOCYTES
RBC.: 3.65 MIL/uL — ABNORMAL LOW (ref 3.87–5.11)
Retic Count, Absolute: 102.2 10*3/uL (ref 19.0–186.0)
Retic Ct Pct: 2.8 % (ref 0.4–3.1)

## 2011-02-25 LAB — DIFFERENTIAL
Basophils Absolute: 0 10*3/uL (ref 0.0–0.1)
Eosinophils Absolute: 0.1 10*3/uL (ref 0.0–0.7)
Eosinophils Absolute: 0.1 10*3/uL (ref 0.0–0.7)
Eosinophils Relative: 2 % (ref 0–5)
Lymphocytes Relative: 13 % (ref 12–46)
Lymphocytes Relative: 14 % (ref 12–46)
Lymphs Abs: 0.9 10*3/uL (ref 0.7–4.0)
Lymphs Abs: 0.9 10*3/uL (ref 0.7–4.0)
Monocytes Relative: 7 % (ref 3–12)
Neutrophils Relative %: 77 % (ref 43–77)

## 2011-02-25 LAB — PROTIME-INR
INR: 1.14 (ref 0.00–1.49)
INR: 1.16 (ref 0.00–1.49)
INR: 1.16 (ref 0.00–1.49)
INR: 1.48 (ref 0.00–1.49)
INR: 1.76 — ABNORMAL HIGH (ref 0.00–1.49)
INR: 2.16 — ABNORMAL HIGH (ref 0.00–1.49)
Prothrombin Time: 13.6 seconds (ref 11.6–15.2)
Prothrombin Time: 14.5 seconds (ref 11.6–15.2)
Prothrombin Time: 14.7 seconds (ref 11.6–15.2)
Prothrombin Time: 14.7 seconds (ref 11.6–15.2)
Prothrombin Time: 23.9 seconds — ABNORMAL HIGH (ref 11.6–15.2)
Prothrombin Time: 28.9 seconds — ABNORMAL HIGH (ref 11.6–15.2)

## 2011-02-25 LAB — HEPARIN LEVEL (UNFRACTIONATED)
Heparin Unfractionated: 0.33 IU/mL (ref 0.30–0.70)
Heparin Unfractionated: 0.55 IU/mL (ref 0.30–0.70)
Heparin Unfractionated: 0.6 IU/mL (ref 0.30–0.70)
Heparin Unfractionated: 0.7 IU/mL (ref 0.30–0.70)

## 2011-02-25 LAB — VITAMIN B12: Vitamin B-12: 267 pg/mL (ref 211–911)

## 2011-02-25 LAB — BASIC METABOLIC PANEL
Calcium: 8.3 mg/dL — ABNORMAL LOW (ref 8.4–10.5)
Creatinine, Ser: 1.72 mg/dL — ABNORMAL HIGH (ref 0.4–1.2)
GFR calc Af Amer: 37 mL/min — ABNORMAL LOW (ref >60)
Sodium: 141 mEq/L (ref 135–145)

## 2011-02-25 LAB — IRON AND TIBC: Saturation Ratios: 13 % — ABNORMAL LOW (ref 20–55)

## 2011-02-25 LAB — AMMONIA: Ammonia: 30 umol/L (ref 11–35)

## 2011-02-25 LAB — FOLATE: Folate: 12.1 ng/mL

## 2011-02-25 LAB — MRSA PCR SCREENING: MRSA by PCR: NEGATIVE

## 2011-02-25 LAB — RAPID URINE DRUG SCREEN, HOSP PERFORMED
Amphetamines: NOT DETECTED
Barbiturates: NOT DETECTED
Cocaine: NOT DETECTED
Opiates: POSITIVE — AB

## 2011-02-25 LAB — PREALBUMIN: Prealbumin: 14.7 mg/dL — ABNORMAL LOW (ref 18.0–45.0)

## 2011-02-25 LAB — MAGNESIUM: Magnesium: 2 mg/dL (ref 1.5–2.5)

## 2011-02-25 LAB — HEMOGLOBIN A1C: Hgb A1c MFr Bld: 8.3 % — ABNORMAL HIGH (ref <5.7)

## 2011-02-27 LAB — CBC
HCT: 37.5 % (ref 36.0–46.0)
Hemoglobin: 12.5 g/dL (ref 12.0–15.0)
MCHC: 33.3 g/dL (ref 30.0–36.0)
MCV: 89.2 fL (ref 78.0–100.0)
Platelets: 219 10*3/uL (ref 150–400)
RDW: 17.7 % — ABNORMAL HIGH (ref 11.5–15.5)

## 2011-02-27 LAB — BASIC METABOLIC PANEL
BUN: 28 mg/dL — ABNORMAL HIGH (ref 6–23)
CO2: 29 mEq/L (ref 19–32)
GFR calc non Af Amer: 37 mL/min — ABNORMAL LOW (ref >60)
Glucose, Bld: 199 mg/dL — ABNORMAL HIGH (ref 70–99)
Potassium: 4 mEq/L (ref 3.5–5.1)
Sodium: 140 mEq/L (ref 135–145)

## 2011-02-27 LAB — DIFFERENTIAL
Lymphocytes Relative: 15 % (ref 12–46)
Lymphs Abs: 1.3 10*3/uL (ref 0.7–4.0)
Monocytes Relative: 6 % (ref 3–12)

## 2011-03-18 ENCOUNTER — Ambulatory Visit (HOSPITAL_COMMUNITY)
Admission: RE | Admit: 2011-03-18 | Discharge: 2011-03-18 | Disposition: A | Discharge status: Home / Self Care | Payer: Medicare HMO | Source: Ambulatory Visit | Attending: Internal Medicine | Admitting: Internal Medicine

## 2011-03-18 DIAGNOSIS — Z1231 Encounter for screening mammogram for malignant neoplasm of breast: Secondary | ICD-10-CM | POA: Insufficient documentation

## 2011-03-22 ENCOUNTER — Ambulatory Visit: Payer: Self-pay | Admitting: Internal Medicine

## 2011-03-25 LAB — URINALYSIS, ROUTINE W REFLEX MICROSCOPIC
Glucose, UA: NEGATIVE mg/dL
Leukocytes, UA: NEGATIVE
pH: 5.5 (ref 5.0–8.0)

## 2011-03-25 LAB — CBC
MCHC: 32.5 g/dL (ref 30.0–36.0)
Platelets: 253 10*3/uL (ref 150–400)
RBC: 4.88 MIL/uL (ref 3.87–5.11)
WBC: 7.1 10*3/uL (ref 4.0–10.5)

## 2011-03-25 LAB — POCT I-STAT, CHEM 8
Chloride: 103 mEq/L (ref 96–112)
Glucose, Bld: 123 mg/dL — ABNORMAL HIGH (ref 70–99)
HCT: 42 % (ref 36.0–46.0)
Potassium: 3.9 mEq/L (ref 3.5–5.1)
Sodium: 143 mEq/L (ref 135–145)

## 2011-03-25 LAB — DIFFERENTIAL
Basophils Relative: 0 % (ref 0–1)
Monocytes Relative: 5 % (ref 3–12)
Neutro Abs: 5.3 10*3/uL (ref 1.7–7.7)
Neutrophils Relative %: 75 % (ref 43–77)

## 2011-03-25 LAB — URINE MICROSCOPIC-ADD ON

## 2011-04-25 ENCOUNTER — Encounter: Payer: Self-pay | Admitting: Internal Medicine

## 2011-04-26 NOTE — Op Note (Signed)
. [facility]  Patient:    [name], [name]                    MRN: [id number] Proc. Date: 10/13/00 Adm. Date:  [id number] Attending:  Herold Harms                           Operative Report  PREOPERATIVE DIAGNOSIS:  Chronic calculous cholecystitis with biliary colic.  POSTOPERATIVE DIAGNOSIS:  Chronic calculous cholecystitis with biliary colic.  PROCEDURE:  Laparoscopic cholecystectomy with intraoperative cholangiogram.  SURGEON:  Luisa Hart L. Lurene Shadow, M.D.  ASSISTANT:  Marnee Spring. Wiliam Ke, M.D.  ANESTHESIA:  General.  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  She had the elements of the knee removed, and she has been on prophylactic anticoagulation against DVT.  She developed severe epigastric and right upper quadrant pain associated with nausea and vomiting. Subsequent workup showed mildly elevated liver function studies with cholelithiasis.  She is brought to the operating room now after her anticoagulation has been switched first from Coumadin to heparin and then stopped approximately 1-1/2 hours pre-surgery.  DESCRIPTION OF PROCEDURE:  Following the induction of satisfactory general anesthesia, the patient is positioned supinely and the abdomen is prepped and draped to be included in our sterile operative field.  Open laparoscopy at the umbilicus noted a very small umbilical hernia, which was used to cannulate the abdominal cavity.  This was done without difficulty with a pursestring suture which was placed around the small hernia in order to close it.  Visual exploration of the abdomen showed the gallbladder seemed somewhat tense and with moderate scarring.  The liver edges were sharp, liver surfaces smooth. Duodenal sweep and anterior gastric wall appeared to be normal.  No other pelvic organs could be  seen.  There were multiple adhesions in the pelvis due to previous pelvic surgery.  Under direct vision, an epigastric and two lateral ports were placed, and the gallbladder was grasped, retracted cephalad, and dissection carried down in the region of the ampulla with isolation of the cystic artery and cystic duct.  The cystic artery doubly clipped and transected, the cystic duct clipped proximally and opened, and a cholangiocatheter inserted into the cystic duct, which was passed into the abdomen through a 14-gauge Angiocath.  The resulting cholangiogram using one-half strength Hypaque dye showed a somewhat enlarged biliary ductal system but without any evidence of filling defects.  There was free flow of contrast into the duodenum.  The cholangiocatheter was removed and the cyst duct doubly clipped and transected.  The gallbladder then dissected free from the liver bed using electrocautery and maintaining hemostasis throughout the course of the dissection with electrocautery.  The gallbladder thus removed, the liver bed was again inspected thoroughly for hemostasis.  Additional bleeding points treated with electrocautery.  The gallbladder bed then irrigated thoroughly with multiple aliquots of normal saline.  Camera removed from the epigastric port and the gallbladder retrieved through the umbilical port using an Endopouch.  This was done without difficulty.  The sponge, instrument, and sharp counts were verified, trocars removed under direct vision, and wound closed in layers as follows after the pneumoperitoneum was allowed to deflate. The umbilical wound was closed with 3-0 Dexon, and all skin wounds were then closed with Dermabond.  Sterile dressings applied, anesthetic reversed, the  patient removed from the operating room to the recovery room in stable condition, having tolerated the procedure well. DD:  10/13/00 TD:  10/14/00 Job: [id number]

## 2011-04-26 NOTE — Discharge Summary (Signed)
NAME:  [name], [name]             ACCOUNT NO.:  [id number]   MEDICAL RECORD NO.:  [id number]          PATIENT TYPE:  INP   LOCATION:  3039                         FACILITY:  [facility]   PHYSICIAN:  [name] L. [name], M.D.     DATE OF BIRTH:  02/03/1951   DATE OF ADMISSION:  11/08/2005  DATE OF DISCHARGE:  11/13/2005                                 DISCHARGE SUMMARY   FINAL DIAGNOSES:  1.  Cerebral artery occlusion, 434.91.  2.  Hypertension, 401.9.  3.  Depressive disorder, 311.  4.  Morbid obesity, 278.01.  5.  Sleep apnea, 780.57.  6.  Postsurgical state, B45189.  7.  Knee joint replacement, status post, V43.65.  8.  Coronary atherosclerosis of unspecified type, 414.00.  9.  Asthma without status asthmaticus, 493.90.   OPERATIONS/PROCEDURES:  None.   HISTORY OF PRESENT ILLNESS:  This is one of several [facility]  admissions for this 60 year old married black female with a longstanding  history of hypertension, morbid obesity, asthma and mild coronary artery  disease.  The patient had been doing well until two weeks prior to  admission.  At that time she had noted increasing levels of drowsiness  during the day.  There was no associated headache.  No chest pains or  shortness of breath.  She did experience some mild exertional dyspnea.  The  patient over the past week had had increasing problems with intermittent  weakness.  For two days prior to admission, she had intermittent transient  difficulty finding words and expressing herself.  This was not associated  with localized weakness.  On the day of admission, her symptoms seemed worse  to the patient and family as well.  She states that she has had periods of  being much more alert with intelligible speech at some times worse than  others.  The patient was subsequently admitted for further evaluation for  possible TIA and stabilization of her symptoms.   PAST MEDICAL HISTORY:  Physical exam is per admission H&P.   HOSPITAL COURSE:  The patient was admitted for further evaluation of altered  speech and level of alertness.  A CT scan at the time of admission  demonstrated a pontine infarct of indeterminate age representing a subacute  to remove.  There was no evidence of an acute infarct at the time.  The  patient was placed on Lovenox per protocol for the first 24 hours.  She was  started on aspirin therapy as well.  She was seen in consultation by the  speech pathologist.  It was felt that the patient had intermittent findings  on her evaluation.  It was felt that no form of speech therapy was  indicated. The possibility of psychological overlay was found.  The  patient's speech notably became more dysarthric when she became anxious  and/or depressed.  Further therapy was recommended as an outpatient.   The patient was seen by OT and PT of the stroke team as well.  It was noted  that no significant inpatient therapy was needed.  Recommended further  outpatient therapy as well.  She  was noted, however, to have elevated blood  pressures during hospital stay.  Norvasc medication was adjusted.  Further  cardiac workup and cerebrovascular workup was pursued.  Carotid Dopplers  showed no evidence for significant disease.  On review of history, it was  noted that the patient had sleep apnea which she had not been using her CPAP  machine.  This was discussed in depth.  The patient was provided a rolling  walker to assist with ambulation while hospitalized.  This did help her a  great deal.   The patient did require further adjustment of blood pressure medicine to a  dose of Norvasc 10 mg with Avalide 300/25 half-tablet daily.  With this, she  obtained good blood pressure control.   By November 13, 2005, the patient was felt to be stable for discharge.  Further evaluation of possible causes of her symptoms were to be pursued as  an outpatient.   MEDICATIONS AT TIME OF DISCHARGE:  1.  Consisted of  isosorbide mononitrate 30 mg daily.  2.  Norvasc 10 mg daily.  3.  Toprol-XL 50 mg daily.  4.  Atrovent 2 puffs q.i.d.  5.  Avalide 300/25 half-tablet daily.  6.  Urocit two p.o. daily.  7.  Advair 250/50 one puff b.i.d.  8.  Protonix 40 mg p.o. daily.  9.  She will also be on Plavix 75 mg daily.  10. Lexapro 10 mg daily.  11. Aspirin 81 mg daily.   The patient will be seen in office in two weeks' time.  She will be  maintained on 4 g sodium, no concentrated sweets diet as well.           ______________________________  Lind Guest. [name], M.D.     ELD/MEDQ  D:  03/05/2006  T:  03/07/2006  Job:  [id number]

## 2011-04-26 NOTE — Discharge Summary (Signed)
NAMEJOSLYN, [name]             ACCOUNT NO.:  [id number]   MEDICAL RECORD NO.:  [id number]          PATIENT TYPE:  INP   LOCATION:  3036                         FACILITY:  [facility]   PHYSICIAN:  Hilda Lias, M.D.   DATE OF BIRTH:  [birth year]   DATE OF ADMISSION:  10/28/2006  DATE OF DISCHARGE:  11/03/2006                                 DISCHARGE SUMMARY   ADMISSION DIAGNOSES:  1. Degenerative disk disease, L4-5, L5-S1, with a chronic radiculopathy.  2. Psoriasis.  3. Obesity.   FINAL DIAGNOSES:  1. Degenerative disk disease, L4-5, L5-S1, with a chronic radiculopathy.  2. Psoriasis.  3. Obesity.   CLINICAL HISTORY:  Patient was admitted because of back pain __________ .  This pain had been going on for 7 years.  Patient has degenerative disk  disease with arthropathy.  Surgery was advised.   LABORATORY:  Normal.   COURSE IN THE HOSPITAL:  Patient was taken to surgery.  L4-5, 5-1 diskectomy  with interbody fusion was done.  After surgery, the patient had been really  slow to get around, but for the past 2 days she had been walking.  The wound  looks fine.  She wants to go home today.  We offered her the possibility of  going to rehab, the patient states that she has plenty of help at home.   CONDITION ON DISCHARGE:  Improvement.   MEDICATIONS:  __________ .  She will continue taking all the previous  medications.   DIET:  She was encouraged to lose weight.   ACTIVITY:  Not to drive for at least a week.   FOLLOWUP:  To be seen by me in 4 weeks or as needed.  We are going to get  for her home health PT and OT.           ______________________________  Hilda Lias, M.D.     EB/MEDQ  D:  11/03/2006  T:  11/03/2006  Job:  [id number]

## 2011-04-26 NOTE — H&P (Signed)
NAME:  [name], [name]                       ACCOUNT NO.:  [id number]   MEDICAL RECORD NO.:  [id number]                   PATIENT TYPE:  INP   LOCATION:  4737                                 FACILITY:  [facility]   PHYSICIAN:  [name] L. [name], M.D.                  DATE OF BIRTH:  1951/06/14   DATE OF ADMISSION:  10/20/2002  DATE OF DISCHARGE:                                HISTORY & PHYSICAL   CHIEF COMPLAINT:  Progressive dyspnea with weakness.   HISTORY OF PRESENT ILLNESS:  The patient is a 60 year old married black  female with longstanding history of morbid obesity, degenerative joint  disease, and hypertension with intermittent asthma, who was admitted at this  time with progressive dyspnea, weakness, nausea, and vomiting.  She states  she had been at her baseline until approximately one week ago.  At that  time, she had noted increasing dyspnea without any sudden change in  activity.  She went to [facility] for evaluation.  She was  told that nothing was significantly wrong.  She continued to have dyspnea  and was seen in the office for follow-up.  It was noted at the time she was  having some hyperventilation with her symptoms being the cold with the  rebreathing.   The patient, however, continued to have increasing dyspnea with occasional  substernal heaviness in the chest.  She notes that this was associated with  some diaphoresis and progressive weakness.  Today, her symptoms worsened and  she was subsequently admitted for further evaluation.  She has not had  similar bouts in the past.   PAST MEDICAL HISTORY:  1. Remarkable for longstanding morbid obesity.  2. Hypertension.  3. Degenerative joint disease as noted.  4. She is status post left total knee replacement per Dr. Deidre Ala in     2001.  This was complicated by osteomyelitis and subsequent prolonged     antibiotic usage.  She did eventually recover.  5. She has a history of recurrent renal  stones as well.  6. She is status post hysterectomy.  7. Status post cholecystectomy and tonsillectomy.  8. She has had a left wrist ganglion resection as well.   HABITS:  The patient does not smoke or drink currently.   ALLERGIES:  She is allergic to PENICILLIN and ERYTHROMYCIN.  She has  tolerated Tequin in the past.  Has had difficulty tolerating CIPRO.   SOCIAL HISTORY:  The patient is married.  Two children.  Lives with her  husband.  Intermittent marital stressors in the past.   FAMILY HISTORY:  Positive for father having massive heart attack.  She has  one sister with coronary artery disease with hypertension as well.   MEDICATIONS:  1. Albuterol inhaler 2 puffs q.i.d.  2. Atrovent 2 puffs q.i.d.  3. Flovent 10 mcg b.i.d.  4. Tequin 400 mg q.d.  5. Urocit q.i.d.  PHYSICAL EXAMINATION:  GENERAL:  She is a weak-appearing black female in no  acute distress.  She becomes very winded even with talking.  VITAL SIGNS:  Blood pressure 184/90, pulse of 94, respiratory rate 20 to 24.  Saturations are 96% on room air.  HEENT:  There is no sinus tenderness.  Extraocular movements intact.  Fundi  are grade 1.  She had decreased light reflex on left versus right.  NECK:  Supple.  No posterior cervical nodes.  LUNGS:  Diminished breath sounds at the bases.  No wheezes or rales could be  appreciated.  No E to A changes noted.  CARDIOVASCULAR:  Normal S1 and S2.  No S3.  No S4.  A 1/6 systolic ejection  murmur at the lower left sternal border.  No rub.  ABDOMEN:  Bowel sounds are present.  No tenderness appreciated.  No masses.  EXTREMITIES:  Negative Homans'.  She has minimal warmth in the left knee.  No edema appreciated.  NEUROLOGICAL:  She is alert and oriented x 3.  Cranial nerves are intact.  Cerebellar sensory and function are grossly intact.  PSYCHIATRIC:  Flat affect with depressed mood.  SKIN:  Well-healed surgical lesion over the left knee.   LABORATORY DATA:  Electrolytes  showed sodium 138, potassium 3.3, chloride  102, CO2 28, BUN 29, creatinine 0.9, glucose 100.  CK was 25, MB 0.6.  troponin 0.01.  Urinalysis was unremarkable.  Other laboratory studies are  pending at this time.   EKG demonstrates normal sinus rhythm with occasional supraventricular  ____________.  She notably had ST segment changes in I, II, and aVL.  Nonspecific changes in the V5 through V6.   IMPRESSION:  1. Atypical chest pain with dyspnea of questionable etiology.  Rule out     atypical angina versus other.  2. Hypertension with elevation in blood pressure.  3. Asthma with intermittent exacerbation.  4. Morbid obesity.  5. Degenerative joint disease.  6. History of renal stones.  7. Morbid obesity.   PLAN:  The patient is admitted for further evaluation and therapy.  We will  place her on telemetry to rule out underlying arrhythmias.  Cardiac enzymes  and arterial blood gases will be obtained on room air.  An EKG and chest x-  ray is pending.  We will place on her nebulizers after gas is drawn q.i.d.  We will obtain 2-D echocardiogram and pulmonary function testing.  We will  place her on incentive spirometry as well.  Pending results of above, we  will proceed with cardiac evaluation as well.                                               [name] L. [name], M.D.    ELD/MEDQ  D:  10/21/2002  T:  10/21/2002  Job:  [id number]

## 2011-04-26 NOTE — Procedures (Signed)
[name]. [facility]  Patient:    [name], [name]                    MRN: [id number] Proc. Date: 04/22/00 Adm. Date:  [id number] Attending:  Charna Elizabeth CC:         Lind Guest. [name], M.D.                           Procedure Report  DATE OF BIRTH:  11/06/1951  REFERRING PHYSICIAN:  Lind Guest. [name], M.D.  PROCEDURE PERFORMED:  Colonoscopy with snare polypectomy x 1.  ENDOSCOPIST:  Anselmo Rod, M.D.  INSTRUMENT USED:  The Olympus video colonoscope.  INDICATIONS FOR PROCEDURE:  A 60 year old, black female with a history of blood in the stool and a family history of colon cancer.  Rule-out masses, polyps, hemorrhoids, etc.  PREPROCEDURE PREPARATION:  Informed consent was procured from the patient. The patient was fasted for eight hours prior to the procedure and prepped with a bottle of magnesium citrate and a gallon of NuLytely the night prior to the procedure.  PREPROCEDURE PHYSICAL EXAMINATION:  VITAL SIGNS:  The patient had stable vital signs.  NECK:  Supple.  CHEST:  Clear to auscultation.  HEART:  S1 and S2 regular.  ABDOMEN:  Soft with normal abdominal bowel sounds.  DESCRIPTION OF PROCEDURE:  The patient was placed in the left lateral decubitus position and sedated with 70 mg of Demerol and 7 mg of Versed intravenously.  Once the patient was adequately sedated and maintained on low-flow oxygen and continuous cardiac monitoring, the Olympus video colonoscope was advanced from the rectum to the cecum without slight difficulty secondary to some residual stool in the right colon.  There was evidence of pan-diverticular disease.  A 5-6 mm sessile polyp was snared from 70 cm with the snare polypectomy forceps.  No other masses or polyps were seen.  However, there was some residual stool in the right colon, and very small lesions may have been missed.  IMPRESSION: 1. One 5-6 mm sessile polyp that was snared from 70 cm. 2. Pancreatic  diverticulosis. 3. Some residual stool in the colon.  Very small lesions may have been missed.  RECOMMENDATIONS: 1. Await pathology results. 2. Avoid all nonsteroidals. 3. Followup in the office in the next two weeks for further recommendations. DD:  04/22/00 TD:  04/23/00 Job: [id number]

## 2011-04-26 NOTE — H&P (Signed)
[place]. [facility]  Patient:    [name], [name]                    MRN: [id number] Adm. Date:  [id number] Attending:  Drema Pry                         History and Physical  PROCEDURE:  Epidural catheter placement.  SURGEON:  Halford Decamp, M.D.  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  Ms. Glendenning is a 60 year old black female who presents to the operating room for revision of her total knee.  The patient discussed postoperative pain control options with the surgeon and myself and gave informed consent for a postoperative epidural catheter placement.  Following her surgery, the patient remained under general anesthesia and was placed in the right lateral decubitus position.  The lumbar spine was sterilely prepped and draped and a 70 gauge Tuohy needle was advanced through a loss of resistance technique.  The epidural needle did not aspirate blood or cerebrospinal fluid and the catheter was then inserted 4 cm into the epidural space.  The needle was removed and the catheter was taped in place.  The catheter did not aspirate blood or cerebrospinal fluid and a 5 cc test dose with 1% lidocaine was negative.  Following a negative test dose, 2 cc of Fentanyl was administered to the patient diluted with 4 cc of normal saline.  The patient was extubated and brought to the PACU where she was placed on a marcaine fentanyl infusion.  The patient tolerated this procedure well and will be followed by the acute pain service. DD:  07/14/01 TD:  07/14/01 Job: [id number]

## 2011-04-26 NOTE — Op Note (Signed)
NAME:  [name], [name]             ACCOUNT NO.:  [id number]   MEDICAL RECORD NO.:  [id number]          PATIENT TYPE:  AMB   LOCATION:  ENDO                         FACILITY:  [facility]   PHYSICIAN:  Anselmo Rod, M.D.  DATE OF BIRTH:  03/21/1951   DATE OF PROCEDURE:  05/12/2006  DATE OF DISCHARGE:                                 OPERATIVE REPORT   PROCEDURE PERFORMED:  Colonoscopy with cold biopsies x 2.   ENDOSCOPIST:  Anselmo Rod, M.D.   INSTRUMENT USED:  Olympus video colonoscope.   INDICATIONS FOR PROCEDURE:  The patient is a 60 year old African-American  female with a history of multiple medical problems, constipation, diabetes,  stroke, morbid obesity, psoriasis, sleep apnea, undergoing surveillance  colonoscopy.  The patient has a family history of colon cancer and has had  adenomatous polyps removed in the past.  Rule out recurrent polyps.   PREPROCEDURE PREPARATION:  Informed consent was procured from the patient.  The patient was fasted for eight hours prior to the procedure after being  prepped with a bottle of MiraLax and Gatorade the night prior to the  procedure.  The risks and benefits of the procedure including a 10% miss  rate for cancer or polyps was discussed with the patient as well.   PREPROCEDURE PHYSICAL:  The patient had stable vital signs.  Neck supple.  Chest clear to auscultation.  S1 and S2 regular.  Abdomen soft with normal  bowel sounds.   DESCRIPTION OF PROCEDURE:  The patient was placed in left lateral decubitus  position and sedated with 75 mcg of fentanyl and 6 mg of Versed in slow  incremental doses.  Bipap machine was placed on the patient by the  respiratory service as she had history of sleep apnea.  Once the patient was  positioned and maintained on low flow oxygen and continuous cardiac  monitoring, the Olympus video colonoscope was advanced from the rectum to  the cecum with extreme difficulty.  There was a large amount of residual  stool in the colon and multiple washes were done.  There was evidence of  pandiverticulosis.  Small internal hemorrhoids were seen on retroflexion.  Two small sessile polyps were biopsied in the rectosigmoid colon.  Small  lesions could be missed because of the poor prep.  Terminal ileum appeared  normal and without lesions.   IMPRESSION:  1.  Small nonbleeding internal hemorrhoid.  2.  Two small sessile polyps biopsied from rectosigmoid colon.  3.  Pandiverticulosis.  4.  Normal terminal ileum.  5.  Large amount of solid stool in the colon, small lesions could be missed.   RECOMMENDATIONS:  1.  Await pathology results.  2.  Avoid all nonsteroidals including aspirin for the next one week.  3.  Resume Plavix after a week.  4.  Outpatient followup as need arises in the future.  Repeat colonoscopy      will be done depending on pathology results.      Anselmo Rod, M.D.  Electronically Signed     JNM/MEDQ  D:  05/12/2006  T:  05/13/2006  Job:  [id number]  cc:   [name] L. [name], M.D.  Fax: [phone]   Eduardo Osier. Sharyn Lull, M.D.  Fax: [phone]

## 2011-04-26 NOTE — Cardiovascular Report (Signed)
NAME:  [name], [name]             ACCOUNT NO.:  [id number]   MEDICAL RECORD NO.:  [id number]          PATIENT TYPE:  OIB   LOCATION:  6501                         FACILITY:  [facility]   PHYSICIAN:  [name], M.D. DATE OF BIRTH:  Nov 05, 1951   DATE OF PROCEDURE:  09/18/2004  DATE OF DISCHARGE:                              CARDIAC CATHETERIZATION   PROCEDURE:  Left cardiac catheterization with selective left and right  coronary angiography as advanced easily around groin using Judkins  technique.   SURGEON:   INDICATIONS FOR PROCEDURE:  [name] is a 60 year old black female with  past medical history significant for coronary artery disease, hypertension,  degenerative joint disease, morbid obesity, sleep apnea, psoriasis, history  of bronchial asthma, history of kidney stones, complains of retrosternal  chest tightness associated with numbness in both hands.  Denies any nausea,  vomiting or diaphoresis.  The patient also complains of exertional dyspnea.  States she is under a lot of stress as her son recently had coronary artery  bypass grafting.  She denies any palpitations, lightheadedness or syncope.  Denies PND, orthopnea or leg swelling.  The patient underwent Persantine  Cardiolite in July of 2005, which showed anterior wall breast attenuation  with some evidence of anterior wall ischemia toward the base with EF of 51%.  Due to recurrent chest pain, mildly positive Persantine Cardiolite,  discussed with patient regarding various options of treatment and concerns  for left catheterization.   PAST MEDICAL HISTORY:  As above.   PAST SURGICAL HISTORY:  She had multiple surgeries in the past; i.e., left  wrist ganglia and dissection.  Had hysterectomy in the past.  Had left total  knee replacement in 2001.  She had cholecystectomy and tonsillectomy in the  past.   MEDICATIONS AT HOME:  1.  Toprol was just recently increased to 100 mg p.o. daily.  2.  Imdur 120 mg  p.o. q.a.m.  3.  Micardis 80 mg p.o. daily.  4.  Spironolactone 25 mg p.o. b.i.d.  5.  Norvasc 10 mg p.o. daily.  6.  Demadex 20 mg p.o. daily.  7.  Atrovent and albuterol inhaler.  8.  Enteric coated aspirin 81 mg p.o. daily.   ALLERGIES:  SHE IS ALLERGIC TO LATEX.  ALSO ALLERGIC TO MULTIPLE  ANTIBIOTICS; I.E., FLOXIN, KEFLEX, SULFA, PENICILLIN, E-MYCIN.  HAD  INTOLERANCE TO CODEINE, TENORMIN, PROZAC, CELEBREX.   SOCIAL HISTORY:  She is married and has two children.  Smoked one pack per  month for one to two years 25 years ago.  No history of alcohol abuse.  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   FAMILY HISTORY:  Father died of massive MI at the age of 95.  Mother died of  MVA at the age of 59.  One sister had coronary artery bypass grafting. She  is hypertensive.  She also has end-stage renal disease.  Her son recently  had three vessel coronary artery disease requiring coronary artery bypass  grafting.   PHYSICAL EXAMINATION:  GENERAL APPEARANCE:  She was alert, awake and  oriented x3 in no acute  distress.  VITAL SIGNS:  Blood pressure 160/96, pulse 72.  HEENT:  Conjunctivae pink.  NECK:  Supple. No JVD, no bruit.  LUNGS:  Clear to auscultation without rhonchi or rales.  CARDIOVASCULAR:  S1 and S2 normal.  Soft S4 gallop.  There was no murmur.  ABDOMEN:  Soft, obese and nontender.  EXTREMITIES:  No clubbing, cyanosis, or edema.   IMPRESSION:  1.  New onset angina, exertional dyspnea, mildly positive Persantine      Cardiolite.  2.  Uncontrolled hypertension.  3.  Morbid obesity.  4.  History of bronchial asthma.  5.  History of kidney stones.  6.  Degenerative joint disease.   Discussed with patient regarding left cath, its risks and benefits, i.e.,  death, MI, stroke, as well as vascular complications, etc., and consented  for the procedure.   DESCRIPTION OF PROCEDURE:  After obtaining the informed consent, the patient  was brought to  the catheterization lab and was placed on fluoroscopy table.  Right groin was prepped and draped in the usual fashion.  Xylocaine 2% was  instilled local anesthesia in the right groin.  With the help of thin wall  needle, a 4 French arterial sheath was placed.  The sheath was aspirated and  flushed.  Next, a 4 French left Judkins catheter was advanced over the wire  under fluoroscopic guidance to the ascending aorta.  Wire was pulled out.  The catheter was aspirated and connected to the manifold.  Catheter was  further advanced and engaged into the left coronary ostium.  Multiple views  of the left systems were obtained.  Next, the catheter was disengaged and  was pulled out over the wire and was replaced with 4 French right Judkins  catheter which was advanced over the wire under fluoroscopic guidance to the  ascending aorta.  Wire was pulled out.  The catheter was aspirated and  connected to the manifold.  The catheter was further advanced and engaged  into right coronary ostium.  Multiple views of the right system were  obtained.  Next, the catheter was disengaged and was pulled out over the  wire and was replaced with 4 French pigtail catheter which was advanced over  the wire under fluoroscopic guidance to the ascending aorta.  Wire was  pulled out. The catheter was aspirated and connected to the manifold.  Catheter was further advanced across the aortic valve into the LV.  LV  pressures are recorded.  Next left ventriculography was done in 30 degree  RAO position.  Post angiographic pressures were recorded from LV and then  pullback pressures were recorded from the aorta.  There was no gradient  across the aortic valve.  Next, the pigtail catheter was pulled out over the  wire.  Sheaths were aspirated and flushed.   FINDINGS:  LV was mildly enlarged.  There was mild LVH.  EF was 55 to 60%. Left main was short which was patent.  LAD had 15 to 20% proximal stenosis.  Diagonal I and II  were very small which were patent.  Ramus was very, very  small, which was patent.  Left circumflex was patent and tapers on an AV  groove after giving off large OM I.  OM I was large which was patent which  bifurcates into mid portion and superior and inferior branches.  Both  branches were patent.  OM II was very, very small which advanced at 0.5 mm.  RCA has 30 to 40% proximal and mid  stenosis.  The patient tolerated the  procedure well.  There were no complications.  The patient was transferred  to the recovery room in stable condition.       MNH/MEDQ  D:  09/18/2004  T:  09/18/2004  Job:  [id number]   cc:   Cath lab

## 2011-04-26 NOTE — H&P (Signed)
NAME:  [name], [name]                       ACCOUNT NO.:  [id number]   MEDICAL RECORD NO.:  [id number]                   PATIENT TYPE:  INP   LOCATION:  0363                                 [facility]ACILITY:  [facility]   PHYSICIAN:  [name] L. [name], M.D.                  DATE OF BIRTH:  28-Jan-1951   DATE OF ADMISSION:  03/22/2003  DATE OF DISCHARGE:                                HISTORY & PHYSICAL   CHIEF COMPLAINT:  Accelerated hypertension with chest pain.   HISTORY OF PRESENT ILLNESS:  First recent [facility] admission for  this 60 year old married black female with a longstanding morbid obesity,  hypertension, asthma, degenerative joint disease.  She had recently went to  her GI doctor for a followup of abdominal complaints.  At their office she  was noted to have a blood pressure of 240/120.  The patient at that time  denied significant headache or chest pains.  Recheck of her blood pressure  showed persistence of her elevated pressures.  She was subsequently referred  to our office for further evaluation.  An office check of her blood pressure  was 220/130.  The patient was given clonidine 1 mg p.o.  After 30 minutes a  recheck of blood pressure remained approximately 200/120.  The patient was  subsequently admitted for further evaluation.   The patient states she had been feeling well until four days prior to her  visit.  She acknowledges becoming acutely upset over the death of a close  personal friend.  She states she had been taking medications except she had  run out of her spironolactone approximately three days prior to her visit.   PAST MEDICAL HISTORY:  As is noted.  Longstanding history of hypertension,  asthma, recurrent renal stones.  Morbid obesity.  She has had intermittent  stressors as well.  Of note, at the time of her initial stress she did  develop substernal pressure pain.  This was associated with some shortness  of breath, no diaphoresis.  She states she  has not had any further chest  pain since that time.   PAST SURGICAL HISTORY:  1. Status post hysterectomy in 1989.  2. Status post laparoscopic surgery of the left knee in 1999 with total knee     replacement in 2000 of the left knee.  This was complicated by infection     with reexploration and prolonged healing in 2002.  3. Status post cholecystectomy in 2002.  4. She has had a cyst removed from the left hand.  5. Status post tonsillectomy at age 60.   ALLERGIES:  The patient has multiple allergies.  1. CODEINE.  2. ZANTAC.  3. PENICILLIN.  4. CELEBREX.  5. E-MYCIN.  6. VIOXX.  7. FLOXIN.  8. SULFA  9. PROZAC.  10.      She states she is also allergic to LATEX GLOVES.  11.  She has had cross reactions to [facility].  12.      Also TENORMIN  in the past as well.   SOCIAL HISTORY:  The patient is married and has two grown children.   Past medical history otherwise notable for distant history of  hyperthyroidism.  This was followed by her having transient hyperthyroidism.   PRESENT MEDICATIONS:  1. Demodex 24 grams p.o. daily.  2. Avapro 300 mg daily.  3. KCl 20 mEq b.i.d.  4. __________  K 10 mEq b.i.d.  5. Multivitamins 1 daily.  6. Spironolactone 25 mg daily.  7. Allegra 50 mg b.i.d.  8. NuLev 0.125 mg p.r.n.  9. Atrovent 2 puffs q.i.d.  10.      Albuterol 2 puffs q.4h. p.r.n.  11.      Toprol XL 50 mg daily.  12.      Alprazolam 0.25 mg t.i.d. p.r.n. anxiety.   HABITS:  The patient does not smoke or drink.   PHYSICAL EXAMINATION:  GENERAL:  She is a well-developed obese black female  presently in no acute distress.  VITAL SIGNS:  Blood pressure 210/95 with thigh cuff in right arm.  Pulse 72,  respirations 12.  HEENT:  Head normocephalic, atraumatic.  Fundi grade 1.  There is no sinus  tenderness.  She has mild turbinate edema bilaterally.  TMs __________  light reflex without erythematous changes.  LUNGS:  Distant breath sounds without wheezes or rales.  No E  to A changes.  CARDIOVASCULAR:  Soft S4, no S1, S2.  No S3.  1/6 systolic ejection murmur  in the left lower border.  No rub appreciated.  ABDOMEN:  Minimal epigastric tenderness.  No masses were appreciated.  EXTREMITIES:  Reveal 1+ pitting edema.  She has tenderness in the left calf.  No increased warmth.  Equivocal Denna Haggard' on the left.  Multiple surgical  scars in the left knee with decreased range of motion.  No increased warmth.  NEUROLOGIC:  Alert and oriented x3.  Cranial nerves are intact.  Cerebellar,  sensory, motor function were intact.  SKIN:  Healing areas of abrasion on her arms, and excoriations on the back.   LABORATORY DATA:  Electrolytes within normal limits.  EKG normal axis with  possible left atrial enlargement.  No acute changes appreciated.  Other  laboratory data pending.   IMPRESSION:  1. Malignant hypertension, possible exacerbated by recent stress, medication     noncompliance.  2. History of chronic essential hypertension.  3. Asthma without present exacerbation.  4. History of transient chest pain, rule out angina equivalent.  She appears     to have been evaluated for coronary artery disease with negative stress     thalium.  5. Morbid obesity without significant change.  6. Degenerative joint disease, chronic.  7. Left leg pain, rule out phlebitis, rule out deep venous thrombosis.  8. History of hypothyroidism in the past.  Most recent TSH within normal     limits, this will need followup.  9. Irritable bowel syndrome.  10.      Intermittent muscle spasms, rule out electrolyte disorder versus     other.   PLAN:  She is admitted for further evaluation and treatment.  She will be  given clonidine for acute control with institution of Cardizem at this time.  Close observation of her rhythm.  We will obtain cardiac enzymes.  She will also need venous Dopplers of the legs to exclude recent occlusion.  Further  therapy pending results  of the above.  She  will remain on a monitor during  this time.                                                [name] L. [name], M.D.    ELD/MEDQ  D:  03/23/2003  T:  03/23/2003  Job:  [id number]

## 2011-04-26 NOTE — H&P (Signed)
NAME:  [name], RUNNION NO.:  [id number]   MEDICAL RECORD NO.:  [id number]          PATIENT TYPE:  INP   LOCATION:  3036                         FACILITY:  [facility]   PHYSICIAN:  Hilda Lias, M.D.   DATE OF BIRTH:  March 18, 1951   DATE OF ADMISSION:  10/28/2006  DATE OF DISCHARGE:                                HISTORY & PHYSICAL   HISTORY OF PRESENT ILLNESS:  The patient came to my office complaining of  back pain, radiation to both legs, which is getting worse lately.  The pain  is worse in the morning and later on in the afternoon.  Because of the pain,  she has gained quite a bit of pain secondary to poor activity.  She has a  hint of psoriasis.  She had an MRI in 2001 and 1 recently.  Because of the  findings, she wanted to proceed with surgery.   PAST MEDICAL HISTORY:  Hysterectomy, surgery of the left arm, left knee  replacement.   ALLERGIES:  She is allergic to FLOXACILLIN, ERYTHROMYCIN, PENICILLIN,  CODEINE, TENORMIN, __________ , CELEBREX, VIOXX, LATEX.   SOCIAL HISTORY:  Is negative.  She is 265 pounds and she is 5'3.   FAMILY HISTORY:  Father died at the age of 5 with a car accident.  Mother  died at the age of 55 with heart disease.   REVIEW OF SYSTEMS:  Positive for kidney stones, back pain, psoriasis, high  blood pressure, asthma.   PHYSICAL EXAMINATION:  Patient came to my office, she had difficulty sitting  and standing.  HEENT:  Normal.  NECK:  Normal.  LUNGS:  There is mild rhonchi bilaterally.  CARDIOVASCULAR:  Normal.  ABDOMEN:  Unable to feel any pulses because of her obesity.  EXTREMITIES:  There is grade 1 edema in the lower extremity and scar on the  knee from previous surgery.  NEUROLOGIC EXAMINATION:  She has decreased flexibility to the lumbar spine,  she has a weak dorsi  and plantar flexion in both legs.  The x-ray showed  that she has a severe case of degenerative disc disease at the level 4-5, 5-  1 with a facet arthropathy.   The MRI showed that she has the same findings  which with degenerative disc disease 4-5, 5-1 with the spondylosis.   CLINICAL IMPRESSION:  1. 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  2. Psoriasis.  3. Obesity.   RECOMMENDATIONS:  The patient is being admitted for surgery and the  procedure will be decompression, laminectomy and discectomy of the level 4-  5, 5-1 with interbody fusion.  She knows about the risks, about infections,  history of leak, no improvement whatsoever, continuing to have chronic pain  because of psoriasis, infection, damage to the vessel of the abdomen.           ______________________________  Hilda Lias, M.D.     EB/MEDQ  D:  10/28/2006  T:  10/28/2006  Job:  [id number]

## 2011-04-26 NOTE — Discharge Summary (Signed)
[place]. [facility]  Patient:    MEDIA, PIZZINI Visit Number: [id number] MRN: [id number]          Service Type: SUR Location: [id number] Attending Physician:  Drema Pry Dictated by:   Darien Ramus, P.A.-C. Admit Date:  07/14/2001 Discharge Date: 07/21/2001                             Discharge Summary  HOSPITAL PROCEDURES:  Removal of cement spacer and reimplant of long stem prosthesis with cemented components revision.  ALLERGIES:  ______, CODEINE, PENICILLIN, KELFEX, CELEBREX, ERYTHROMYCIN, and TENORMIN.  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 This was removed and replaced by an antibiotic spacer. The patient underwent long-term antibiotic care and was deemed ready for revision and replacement of this prosthesis.  PAST MEDICAL HISTORY:  Significant for knee surgery, hysterectomy, left wrist cyst, left forearm infection, gallbladder removal and kidney stones.  SOCIAL HISTORY:  The patient denies the use of tobacco and alcohol.  FAMILY HISTORY:  Significant for coronary artery disease, hypertension, diabetes, and arthritis.  HOSPITAL COURSE:  The patient was admitted to [facility] on July 14, 2001 where she underwent removal of cement spacer and reimplant of a long stem prosthesis with cemented components. The patient tolerated this procedure well and transferred to the post anesthesia care unit in stable condition. The patient followed an uncomplicated course of recovery and attempted to be sent to rehab; however, due to lack of bed space, the patient was subsequently discharged home on July 21, 2001.  CONDITION ON DISCHARGE:  Stable.  DISCHARGE MEDICATIONS:  The patient was to resume her preoperative medications as well as Lortab 10 mg one to two p.o. q. 6h p.r.n. pain, Coumadin 10 mg p.o. q.d., and ferrous sulfate  q.d.  DISCHARGE LABS:  The latest labs from July 20, 2001 shows a WBC of 5.7, RBC 3.72, hemoglobin 9.9, hematocrit 29.4. PT was 20.5, INR 2.1.  DISCHARGE INSTRUCTIONS:  Weightbearing as tolerated with her walker. She is on no diet restrictions. She is to keep the incision clean and dry. She is to call if she runs a fever equal to or greater than 101 and will return to see Dr. Renae Fickle in 10 days after discharge.  Dictated by:   Darien Ramus, P.A.-C. Attending Physician:  Drema Pry DD:  08/20/01 TD:  08/20/01 Job: [id number]

## 2011-04-26 NOTE — Op Note (Signed)
[place]. [facility]  Patient:    [name], [name]                    MRN: [id number] Proc. Date: 07/14/01 Adm. Date:  [id number] Attending:  Drema Pry CC:         Dewayne Shorter, M.D.  [name] L. [name], M.D.   Operative Report  PREOPERATIVE DIAGNOSIS:  Previously infected left total knee arthroplasty, status post parts removal and antibiotic cement spacer.  POSTOPERATIVE DIAGNOSIS:  Previously infected left total knee arthroplasty, status post parts removal and antibiotic cement spacer.  PROCEDURE:  Removal of cement spacer with revision total knee arthroplasty, left knee.  Rotating platform DePuy LCS type, with antibiotic cement.  SURGEON:  Jearld Adjutant, M.D.  ASSISTANTS:  Harvie Junior, M.D., and Darien Ramus, P.A.-C.  ANESTHESIA:  General endotracheal.  CULTURES:  Cultures taken of tissue and fluid.  DRAINS:  Two medium Hemovacs.  BIOPSIES:  Frozen section for organisms and acute inflammatory cells, which was negative.  ESTIMATED BLOOD LOSS:  200 cc.  REPLACED:  Without.  TOURNIQUET TIME:  1 hour 39 minutes.  PATHOLOGIC FINDINGS AND HISTORY:  Frimy underwent total knee arthroplasty June 07, 1999, two years ago.  She then had an infection and on October 04, 1999, underwent debridement with open irrigation of the left knee and placement on antibiotics.  After that, however, she had a low-grade infection.  Her first organism was Staph sensitive to everything but oxacillin.  Ultimately we could never grow anything but she had consistent pain, and so it was elected to proceed with parts removal for this chronic infection despite rifampin and Levaquin.  This was done on September 30, 2000.  Subsequent to that she has been on antibiotics, finally off antibiotics, with a lowering sedimentation rate down to 47, no local signs of infection, and because we felt the antibiotic spacers were beginning to erode the bone, we felt it  was time to proceed with revision arthroplasty.  By June 22, 2001, her sedimentation rate was down to 47, C-reactive protein 1.3, white count was 4300.  She has not had antibiotics for her knee for several months by that time.  At surgery we did not find any purulence.  Fluid looked amber to clear.  We debrided soft tissue and did send it for Gram stain as well as frozen, and there was no evidence of acute infection.  Ultimately we did a revision cut on the tibia, placing a large 15 mm revision tibial tray.  We used a standard left revision femur, both with long stems, a standard plus large 12.5 mm insert with the flexion gap being 22.5, so that given the fact that the standard tibial tray is 5 mm going up to a 15, is 10 mm plus the 12.5 mm is 22.5 on flexion-extension, with full extension and full flexion.  We used a 35 mm patella.  We downsized a bit to achieve better flexion.  We did an extensive lateral release.  We did not do a slide.  The patellar tendon did remain attached but in the end with flexion, there was a little bit of a tear-off more proximally, so we did anchor that down with two large four-pronged Mitek anchors through-and-through with the tendon in an elongated position to allow for better flexion.  At closure we were able to achieve 90 degrees of flexion and full extension.  Thorough debridement and irrigation was carried out.  Palacos cement was used, two batches, with 1 g of vancomycin per batch.  DESCRIPTION OF PROCEDURE:  With adequate anesthesia obtained using endotracheal technique, having given vancomycin preoperatively, the patient was placed in the supine position, and the left lower extremity was prepped from the toes to the tourniquet in standard fashion.  After standard prepping and draping, Esmarch exsanguination was used, and the tourniquet was let up to 350 mmHg.  We then excised the old scar.  After the old scar was excised, the incision was deepened  sharply with a knife and dissection carried down to the medial retinaculum, where a medial retinacular parapatellar incision was made. We then took soft tissue off the anterior patella and thickened scar.  We did a lateral release, removed soft tissues from the joint line, and then removed the femoral antibiotic spacer.  We then were able to flex the knee with the patella everted.  We removed the patellar antibiotic spacer, the tibial antibiotic spacer, and the rotating platform that had been in place.  A thorough debridement was then carried out of all the soft tissues.  We did not need to do a quadriceps slide nor a tubercleplasty.  At this point we continued to debride.  We placed the intramedullary guide down the tibia, made a fresh tibial cut at 0 degrees, taking approximately 5 mm of bone.  We then sized to a standard plus revision femur going up a size, and that was so that we did not have so much of a flexion gap.  We set it in rotation with the 22.5, pinned it, made the anterior-posterior cuts, 22.5 it was on flexion.  We then did the 5 degree valgus femoral cutting jig, put it in place just to cut a fresh cut distally, made that cut, and then we fit to a 22.5 in extension. We then placed the anterior-posterior chamfer cut and reamed out a canal for the stem.  At this point we sized the tibia, used the smoke stack for a guide, and then placed the long-stem reamer down the canal.  We sized to a large revision tibial tray.  We then trialled a 15 mm large tibial revision tray with a 12.5 insert, placed on the standard left revision femur with stem, and articulated the knee through a range of motion.  We then freshened the cut on the patella, removed some soft tissue, made the three-peg holes, and then sized for the larger patella but came down to the 35 with less thickness.  On trialling, we did get a little give in our tibial attachment of the patellar ligament, but it was still  attached distally.  Improved flexion to 90 degrees.  We then removed all trial components and thoroughly jet-lavaged the knee while cement was mixed with the vancomycin.  Palacos cement used.  We then checked the components for sizing as they came on the table.  We then mixed the cement and cemented on the tibial tray in the appropriate rotation, impacted it, removed excess cement.  We standard plus-ed the large 12.5 mm rotating platform.  We then cemented the left standard plus revision femur component with stem, impacted it, and removed excess cement.  We then hyperextended and removed excess cement and held it in 30 degrees while the cement set.  We then cemented on the poly button with Palacos cement, impacted it, and removed excess cement.  When the cement had hardened, we finished the jet lavage.  We had jet-lavaged prior to implantation  as per routine.  We then finished the jet lavage and let the tourniquet down.  Bleeding points were cauterized. Hemovac drains were brought in the medial and lateral gutters and brought out the superior lateral portal.  The wound was then meticulously closed in layers after we put two Mitek anchors, allowing the patellar tendon to be in elongated position, and anchored it down more proximally on the proximal tibia to protect its insertion.  We then tested the range of motion of 0-90. The wound was closed meticulously in layers with #1 Vicryl on the medial retinaculum, 0, 2-0, and 3-0 on the subcutaneous tissue, and skin stales. Hemovacs hooked up to the self-suction and a bulky sterile compressive dressing was applied and the patient, having tolerated the procedure well, was awakened, taken to the recovery room in satisfactory condition for routine postoperative care after receiving an epidural, for routine CPM and analgesia. DD:  07/14/01 TD:  07/14/01 Job: [id number]

## 2011-04-26 NOTE — Discharge Summary (Signed)
[place]. [facility]  Patient:    [name], [name]                    MRN: [id number] Adm. Date:  [id number] Disc. Date: [id number] Attending:  Herold Harms Dictator:   Vilinda Blanks. Moye, P.A.-C. CC:         [name] L. [name], M.D.  Dr. 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  PROCEDURE:  Debridement of left knee with removal of prosthetic components and placement of antibiotic Palacos cement spacers by Dr. Renae Fickle on September 30, 2000.  DISPOSITION:  Patient was discharged and transferred to the physical medicine rehabilitation service for complete inpatient rehabilitation prior to discharge home.  HOSPITAL COURSE:  This was a 60 year old African-American female with history of left total knee replacement in June 2000 with chronic low-grade infection of total knee having failed antibiotic therapy.  She was admitted and underwent removal of components as above by Dr. Renae Fickle on September 30, 2000. Postoperatively, she was placed on Coumadin for anticoagulation and also on a combination of rifampin and Tequin for antibiotic coverage.  Postoperative course was uneventful.  She worked with physical therapy and occupational therapy for improved mobility.  She was discharged and transferred to the physical medicine rehabilitation service for comprehensive inpatient rehabilitation for increased mobility prior to discharge home.  She would remain on dual antibiotic therapy per ID service and touchdown weightbearing only on the left lower extremity. DD:  12/18/00 TD:  12/18/00 Job: [id number]

## 2011-04-26 NOTE — Cardiovascular Report (Signed)
NAME:  [name], [name]                       ACCOUNT NO.:  [id number]   MEDICAL RECORD NO.:  [id number]                   PATIENT TYPE:  INP   LOCATION:  4737                                 FACILITY:  [facility]   PHYSICIAN:  Mohan N. Sharyn Lull, M.D.              DATE OF BIRTH:  May 13, 1951   DATE OF PROCEDURE:  10/26/2002  DATE OF DISCHARGE:                              CARDIAC CATHETERIZATION   PROCEDURE:  Left cardiac catheterization with selective left and right  coronary angiography, left ventriculography via right groin using Judkins  technique.   INDICATIONS FOR PROCEDURE:  The patient is a 61 year old black female with a  past medical history significant for hypertension, degenerative joint  disease, morbid obesity, history of bronchial asthma, history of kidney  stones, was admitted on October 20, 2002, because of progressive increase  in shortness of breath and vague retrosternal chest pain described as  tightness associated with diaphoresis radiated into both arms and shoulders.  The patient also complains of cough with yellowish phlegm which is better  today after taking antibiotics.  The patient denies any palpitations,  lightheadedness or syncope.  The patient gives history of PND, orthopnea and  leg swelling.  The patient states he had cardiac work-up many years ago and  was okay.  Denies any fevers or chills.  Denies any relation of chest pain  to breathing or food.   PAST MEDICAL HISTORY:  As above.   PAST SURGICAL HISTORY:  She had left wrist ganglion resection many years  ago, had hysterectomy many years ago.  Had left total knee replacement in  2001.  Had cholecystectomy and tonsillectomy many years ago.   ALLERGIES:  None.   MEDICATIONS:  1. She was on Imdur 60 mg p.o. daily  2. Tiazac 240 mg daily  3. Xanax 0.25 mg t.i.d.  4. Albuterol, Atrovent, and Flovent inhalers.  5. Ambien p.r.n.   SOCIAL HISTORY:  She is married and has two children.  Smoked  one-pack for a  month for 1-2 years, 20+ years ago.  No history of alcohol or drug abuse.  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   FAMILY HISTORY:  Father died of massive MI at the age of 96.  Mother died of  motor vehicle accident at the age of 21.  One brother in good health.  One  sister had coronary artery bypass grafting.  She is hypertensive, diabetic,  and also has end-stage renal disease.   BRIEF HOSPITAL COURSE:  The patient was admitted to telemetry unit.  MI was  ruled out by serial enzymes and ECG.  The patient's subsequently underwent  adenosine Cardiolite which showed inferior and inferolateral wall ischemia  with an EF of 31%.  Discussed with patient regarding adenosine Cardiolite,  results and various options of treatment, i.e. medical versus invasive, left  catheterization, possible PTCA and stenting its risks, i.e., death, MI,  stroke, need for emergency CABG, risk of restenosis, local vascular  complications, etc., and consented for PCI.   DESCRIPTION OF PROCEDURE:  After obtaining the informed consent, the patient  was brought to the catheterization lab and was placed on the fluoroscopy  table.  The right groin was prepped and draped in the usual fashion.  Xylocaine 2% was used for local anesthesia in the right groin. With the help  of a thin wall needle, a 6 French arterial sheath was placed.  The sheath  was aspirated and flushed.  Next, a 6 French left Judkins catheter was  advanced over the wire under fluoroscopic guidance up to the ascending  aorta.  The wire was pulled out, the catheter was aspirated and connected to  the manifold.  The catheter was further advanced and engaged into the left  coronary ostium.  Multiple views of the left system were taken.  Next, the  catheter was disengaged and was pulled out over the wire and was replaced  with a 6 French right Judkins catheter, which was advanced over the wire  under  fluoroscopic guidance up to the ascending aorta.  The wire was pulled  out, the catheter was aspirated and connected to the manifold.  The catheter  was further advanced and engaged into the right coronary ostium. Multiple  views of the right system were taken.  Next, the catheter was disengaged and  was pulled out over the wire and was replaced with 6 French pigtail catheter  which was advanced over the wire under fluoroscopic guidance up to the  ascending aorta. The wire was pulled out, the catheter was aspirated and  connected to the manifold.  The catheter was further advanced across the  aortic valve into the LV.  LV pressures were recorded. Next, LV graph was  done in 30-degree RAO position.  Postangiographic pressures were recorded  from LV and then pullback pressures were recorded from the aorta.  There was  no gradient across the aortic valve.  Next, the pigtail catheter was pulled  out over the wire, the sheaths were aspirated and flushed.   FINDINGS:  LV showed good LV systolic function, EF of 50-55%.   Left main was short, which was patent.   LAD has 10-15% mid stenosis.  Diagonal #1 was patent.   Left circumflex was patent proximally which tapers down in the AV groove  after giving off large OM-2.  OM-1 is very small which is less than 0.5 mm.  OM-2 is large which is patent.   Right coronary artery has 30-40% proximal and mid focal stenosis.   The patient tolerated the procedure well.  There were no complications.  The  patient was transferred to the recovery room in stable condition.                                                   Eduardo Osier. Sharyn Lull, M.D.    MNH/MEDQ  D:  10/26/2002  T:  10/26/2002  Job:  [id number]   cc:   Minerva Areola L. [name], M.D.  [address] [address]  [place]  [place] [address]  Fax: [phone]   Cardiac Catheterization Laboratory

## 2011-04-26 NOTE — Discharge Summary (Signed)
NAME:  [name], [name]                       ACCOUNT NO.:  [id number]   MEDICAL RECORD NO.:  [id number]                   PATIENT TYPE:  INP   LOCATION:  0363                                 FACILITY:  [facility]   PHYSICIAN:  [name] L. [name], M.D.                  DATE OF BIRTH:  Nov 02, 1951   DATE OF ADMISSION:  03/22/2003  DATE OF DISCHARGE:  03/25/2003                                 DISCHARGE SUMMARY   FINAL DIAGNOSES:  1. 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  2. Cardiac dysrhythmias, 427.89.  3. Asthma without status asthmaticus, 493.90.  4. Limb pain, 729.5.  5. Morbid obesity, 278.01.  6. History of past noncompliance, V15.81.  7. Irritable colon, 564.1.  8. Knee joint replacement status post, V43.65.  9. Osteoarthritis of the left leg.   OPERATIONS AND PROCEDURES:  None.   HISTORY OF PRESENT ILLNESS:  This is the first recent [facility]  admission for this 60 year old married black female with long-standing  morbid obesity, hypertension, asthma, and degenerative joint disease.  She  had recently gone to her GI doctor for follow up of abdomina complaints.  At  the office, she was noted to have a blood pressure of 240/120.  The patient  at that time denied significant headaches or chest pains.  Recheck of her  blood pressure showed persistence of her elevated pressures.  The patient  was subsequently referred to our office for further evaluation.  An office  check of her blood pressure was 220/130.  The patient was given clonidine  0.1 mg p.o.  After 30 minutes, her blood pressure remained approximately  200.  The patient was subsequently admitted for further evaluation.  History  is significant for patient stating she was feeling well four days prior to  her visit.  The patient was clearly upset over the death of a close friend.  She states she had been taking her medications on a regular basis, except  she had run out of her spironolactone approximately three days prior  to her  visit.   PAST MEDICAL HISTORY AND PHYSICAL EXAMINATION:  As per admission H&P.   HOSPITAL COURSE:  The patient was admitted for further treatment of severe  malignant hypertension.  It was felt that is was possibly exacerbated by  recent stress and medication noncompliance.  She has a history of chronic  essential hypertension which had previously been better controlled until  this most recent event.  The patient was placed on telemetry.  She was  restarted on her medications as before.  She also was started on clonidine  to control her blood pressure.  She also had the reinstitution of Cardizem.  Cardiac enzymes were obtained q.8h. x3 which were negative for recent  myocardial injury.  She also had venous Dopplers which did not show any  evidence of venous disease.  This was notable for having had pain in the  left  leg with a question of phlebitis.   With continued therapy, the patient's blood pressure did gradually improve.  She was, however, noted to have significant sinus bradycardia with any  increasing dose of the Cardizem.  The Cardizem was subsequently discontinued  on 03/23/03, and the patient was started on Norvasc at 10 mg p.o. daily.  She  tolerated this well.  She had no significant arrhythmias or bradycardia.  She would complain of intermittent muscle spasms that were nonspecific in  nature.  By 03/24/03, she was doing considerably better.  She became more  mobile.  Notably, there was a question raised regarding her snoring.  There  was a question of sleep apnea which is to be addressed as an outpatient.   The patient's dietary history was reviewed here, as well.  Not much work.  Will need to be followed up on this.  By 03/25/03, she was subsequently  discharged home much improved.   MEDICATIONS AT THE TIME OF DISCHARGE:  1. Demadex 20 mg p.o. q.a.m.  2. Avapro 300 mg daily.  3. Norvasc 10 mg daily.  4. Urocit-K 10 mEq t.i.d.  5. Magonate 500 mg daily.  6.  Astelin nasal spray two puffs b.i.d.  7. Toprol-XL 50 mg daily.  8. Atrovent two puffs q.i.d. with albuterol two puffs q.i.d. as needed.  9. Flexeril 5 mg q.i.d. as needed for muscle spasms.   DIET:  She will be maintained on a 4 gm sodium, 1600 calorie diet.   FOLLOW UP:  Follow up in the office in two weeks time.                                               [name] L. [name], M.D.    ELD/MEDQ  D:  05/04/2003  T:  05/05/2003  Job:  [id number]

## 2011-04-26 NOTE — Op Note (Signed)
[place]. [facility]  Patient:    [name], [name]                    MRN: [id number] Proc. Date: 09/30/00 Adm. Date:  [id number] Attending:  Drema Pry CC:         Dewayne Shorter, M.D.  (Office)  Lind Guest. [name], M.D.  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  OPERATION PERFORMED:  Debridement of left knee with removal of prosthetic components and placement of antibiotic Palacos cement spacers.  SURGEON:  Jearld Adjutant, M.D.  ASSISTANT:  Burnadette Peter, P.A.C.  ANESTHESIA:  General endotracheal.  SPECIMENS:  Cultures taken of tissue with biopsies also.  BLOOD LOSS:  150 cc.  BLOOD REPLACEMENT:  None.  TOURNIQUET TIME:  1 hour and 4 minutes.  PATHOLOGIC FINDINGS AND HISTORY:  Tuwanna had an LCS rotating platform cemented total knee replacement with antibiotics placed.  In the postoperative period she got an infection, which we treated with debridement and attempted to save the parts.  She was placed on long term antibiotics by infectious disease including rifampin and later Levaquin.  However, she began to have continuous pain in the knee, decreased range of motion, swelling that was intermittent and night sweats.  Repeated aspirations were negative, but ultimately one aspiration grew a light growth of Staph, which was sensitive to everything, except penicillin.  We, at one point, felt we might just suppress the infection long term since it was so low-grade; however, the patient began to have recurrent episodes of night sweats and pain.  Therefore it was elected to go ahead and debride and pull the parts, to put an antibiotic spacer in, and to hope for a cure to go back to reimplant.  We removed her components, used the confirmation of the distal femur and tibia, with molds to make  a spacer on the distal femur of Palacos cement with tobramycin and the tibia with tobramycin and placed a 10 mm rotating platform that we did not allow to rotate as a spacer in getting the knee in full extension, and also put a spacer of cement, like a button, behind the patella. All cement was removed; and, a fair amount of joint preservation and bone preservation was achieved.   There was a fairly thick subcutaneum as well as synovium and we debrided all this.  Two Mitek anchors, which had been used before on the tibial tubercle, were removed.  Single layer closure was carried out at the end over two large drains.  She had been on Levaquin and got a dose of that in the operating room, and will postoperatively be on Levaquin and rifampin, and followed by ID.  DESCRIPTION OF PROCEDURE:  With adequate anesthesia obtained, using endotracheal technique and Levaquin given for IV prophylaxis the patient was placed in the supine position.  The left lower extremity was prepped from the toes to the tourniquet in the standard fashion.  After standard prepping and draping an Esmarch for exsanguination was used.  The tourniquet was left up to 400 mmHg.  The old scar was then incised.  The incision was deepened sharply with a knife and hemostasis obtained using the Bovie electrocoagulator.  A median parapatellar skin incision was then made and thorough debridement of all granulation and thick soft tissue was carried out.  I then used the  Moreland chisels to remove the femoral component, the rotating platform and the tibial component.  Osteotomes and rongeurs were then used to remove the cement. Thorough irrigation was then carried out with jet lavage.  The patella was also removed.  I then fashioned out of Palacos cement, using the tibial component with spacer and a mold for the femoral component, two size larger, with mineral oil on it, as a template and then on the patella.  We allowed  the cement to harden, but not into the bone and this acted as spacer components with a 10 mm nonrotating platform placed as a spacer in between.  We got to full extension, but not hyperextension.  The tourniquet was then let down. Bleeding points were cauterized.  Hemovac drains were brought out the superolateral portal and the wound was closed with a single layer closure of #1 Ethilon, 2-0 Ethilon in between, and staples.  Vertical mattress sutures placed.  A bulky sterile compressive dressing was applied with the knee in extension and a knee immobilizer.  The patient having tolerated the procedure well was awakened and taken to the recovery room in satisfactory condition for postoperative epidural fentanyl catheter. DD:  09/30/00 TD:  09/30/00 Job: [id number]

## 2011-04-26 NOTE — Discharge Summary (Signed)
[place]. [facility]  Patient:    [name], [name]                    MRN: [id number] Adm. Date:  [id number] Disc. Date: [id number] Attending:  Herold [name] Dictator:   Nancy Rossetti. Angiulli, P.A. CC:         Nancy L. [name], M.D.  [name], M.D.  [name], Infectious Disease  Nancy Celeste. Lurene [name], M.D.   Discharge Summary  DISCHARGE DIAGNOSES: 1. Chronic knee infection on left status post debridement, removal of    components after total knee replacement in June 2000, placement of    antibiotic spacer on September 30, 2000. 2. Hypertension. 3. Postoperative anemia. 4. Postoperative nausea and vomiting, resolved. 5. Hypothyroidism.  PROCEDURES: 1. Status post debridement and removal of components and placement of    antibiotic spacer on left knee after total knee replacement June 2001. 2. Laparoscopic cholecystectomy per [name] October 13, 2000.  HISTORY OF PRESENT ILLNESS:  A 60 year old female, well known to rehabilitation services with a left total knee replacement in June 2000, admitted with chronic infection of the left knee, positive Staphylococcus aureus.  Failed antibiotic treatment.  Underwent debridement of left knee with removal of components and placement of antibiotic cement spacer on October 23 per Dr. Renae [name].  Plan is for total knee revision in 8 to 10 weeks, placed on Coumadin for DVT prophylaxis and touchdown weightbearing.  Followup infectious disease, maintained on Rifadin and Tequin x 6 weeks total.  Some nausea and vomiting resolved with adjustments in medications.  [name], primary M.D., did order an ultrasound of the gallbladder to rule out any activities.  Latest INR 3.1.  Hemoglobin 8.4.  Chemistries unremarkable. Admitted for comprehensive rehabilitation program.  PAST MEDICAL HISTORY:  See Discharge Diagnoses.  PAST SURGICAL HISTORY:  Hysterectomy.  ALLERGIES:  FLOXIN, CODEINE, PENICILLIN, KEFLEX,  CELEBREX, ERYTHROMYCIN, and TENORMIN.  MEDICATIONS PRIOR TO ADMISSION: 1. Hyzaar 12.5 mg daily. 2. Tiazac 420 mg daily. 3. Synthroid 0.01 mg daily. 4. Sular 20 mg daily. 5. K-Dur 20 mEq twice daily. 6. Claritin 10 mg daily. 7. Demadex 1/2 tablet daily. 8. Micro-K 3 times daily.  SOCIAL HISTORY:   No alcohol or tobacco.  Primary M.D. is [name]. Lives with husband in [place], used a walker and cane prior to admission, needed some assistance for cooking.  One-level home, three steps to entry, husband with lifting restrictions.  HOSPITAL COURSE:  The patient did well while on rehabilitation services with therapies initiated on a b.i.d. basis.  The following issues are followed during patients rehabilitation course.  Pertaining to [name] left knee infection, maintained on antibiotics and Rifadin and Tequin for a total of six weeks.  She is touchdown weightbearing with plan for total knee replacement in 8 to 10 weeks.  Suture, staples have been removed, and no signs of infection.  She continued on Coumadin for DVT prophylaxis.  She was tapered from this medication. Hemoglobin and hematocrit remained stable.  Blood pressure controlled.  She was monitored closely for some nausea and vomiting.  She had been seen by her primary M.D., [name], with an ultrasound of the gallbladder being completed that showed multiple gallstones within the gallbladder.  The common bile duct appeared to be normal.  Minimal thickening of the gallbladder noted. [name] of general surgery was consulted per primary M.D., [name]. The patient underwent laparoscopic cholecystectomy on October 13, 2000,  and tolerated this procedure well.  Due to this recent surgery and the fact that her mobility had greatly improved, and Dopplers of the lower extremities were negative, she was tapered from her Coumadin therapy and monitored.  Overall, for functional mobility, she was ambulating  extended household distances with a walker independent in her room.  She would received ongoing therapies as advised and follow up with [name] in one week for removal of staples after gallbladder surgery.  Latest labs showed an INR of 1.2 on October 14, 2000.  Hemoglobin 8.9, hematocrit 27.1.  This was a stable hemoglobin with latest being 9.6 on October 31.  Chemistries showed sodium 133, potassium 4.2, BUN 34, creatinine 1.5.  DISCHARGE MEDICATIONS:  1. Rifadin 300 mg daily.  2. Tequin 400 mg daily.  3. Coumadin 1 mg daily x 1 week, then discontinue.  4. Synthroid 100 mcg daily.  5. K-Dur 20 mEq twice daily.  6. Trinsicon twice daily.  7. Protonix 40 mg daily.  8. Hydrochlorothiazide 25 mg daily.  9. Cozaar 50 mg daily. 10. Cardizem CD 300 mg daily. 11. Tylox 1 or 2 tablets every 4 to 6 hours as needed for pain.  ACTIVITY:  Touchdown weightbearing to left leg.  DIET:  Regular.  SPECIAL INSTRUCTIONS:  No aspirin or ibuprofen while on Coumadin.  Follow up with [name] in one week for removal of staples; [name], medical management; [name], orthopedic services. DD:  10/14/00 TD:  10/14/00 Job: [id number]

## 2011-04-26 NOTE — Procedures (Signed)
[place]. [facility]  Patient:    [name], [name]                      MRN: [id number] Proc. Date: 03/24/00 Attending:  Thyra Breed, M.D. CC:         Jearld Adjutant, M.D.                           Procedure Report  PREOPERATIVE DIAGNOSIS:  Lumbar spondylolisthesis, facet joint arthritis, and degenerative disk disease.  POSTOPERATIVE DIAGNOSIS:  Lumbar spondylolisthesis, facet joint arthritis, and degenerative disk disease.  PROCEDURE:  Lumbar epidural steroid injection.  SURGEON:  Thyra Breed, M.D.  ANESTHESIA:  CLINICAL HISTORY:  I spoke with Dewayne Shorter, M.D. last week, and he went ahead and gave me the green light to go ahead and do a lumbar epidural steroid injection on the patient.  He does not feel that she is at increased risk for infection, s she continues on her medications for her infected knee which is improving.  Her symptoms are unchanged.  PHYSICAL EXAMINATION:  Blood pressure 188/81, heart rate 82, respiratory rate 14, and O2 saturations 97%.  Pain level is 5 out of 10, and temperature is 98 degrees. Her neurologic exam is grossly unchanged from last week.  DESCRIPTION OF PROCEDURE:  After informed consent was obtained, the patient was  placed in the sitting position and monitored.  Her back was prepped with Betadine x 3.  The _______ was raised at L4-5 interspace with 1% lidocaine.  A 20-gauge Tuohy needle was introduced into the lumbar epidural space with loss of resistance to  preservative-free normal saline.  There was no CSF nor blood.  80 mg of Medrol nd 10 ml of preservative-free normal saline was gently injected.  The needle was flushed and removed intact.  POST-PROCEDURE CONDITION:  Stable.  DISCHARGE INSTRUCTIONS: 1. Resume previous diet. 2. Limitations and activities per instruction sheet. 3. Continue on current medications. 4. Followup with me in one week for a repeat injection. DD:   03/24/00 TD:  03/24/00 Job: [id number]

## 2011-04-26 NOTE — H&P (Signed)
NAME:  Nancy, [name]             ACCOUNT NO.:  [id number]   MEDICAL RECORD NO.:  [id number]          PATIENT TYPE:  INP   LOCATION:  3039                         FACILITY:  [facility]   PHYSICIAN:  [name], M.D.     DATE OF BIRTH:  February 07, 1951   DATE OF ADMISSION:  11/08/2005  DATE OF DISCHARGE:                                HISTORY & PHYSICAL   CHIEF COMPLAINT:  Progressive weakness, intermittent slurred speech, CVA.   HISTORY OF PRESENT ILLNESS:  This is one of several [facility]  admissions for this 60 year old, married, black female with a longstanding  history of hypertension, morbid obesity, asthma, with mild coronary artery  disease.  She had been doing well with ongoing chronic obesity and mild  persistent hypertension until approximately two weeks ago.  The patient  noted at that time increasing levels of drowsiness during the day.  There  was no associated headache.  She denies chest pains or shortness of breath.  There was some mild exertional dyspnea.  The patient was seen in the office  approximately one week ago.  There were no specific findings on neurologic  exam.  Blood pressure was mildly elevated at 160/80.  On further  questioning, the patient acknowledged that she had not been using her CPAP  machine for sleep apnea.  Husband relates that she had not used this for  several months.  The patient was advised to resume this immediately.  She  was also encouraged to stay on her blood pressure medications.  Despite these measures, she continued to have problems with intermittent  weakness.  For the past two days, she has had intermittent transient  dysphagia with difficulty finding words and expressing herself.  This was  not associated with localized weakness.  Today, her symptoms seemed worse to  patient and family.  She states she has had periods of being much more alert  with intelligible speech versus others.  She is subsequently admitted for  further  evaluation of possible TIA and stabilization of her symptoms.  She denies localized weakness.  There has been no seizure activity or loss  of consciousness.  States that her gait has been attacked.   HABITS:  The patient does not smoke or drink.   PAST SURGICAL HISTORY:  1.  Status post hysterectomy.  2.  Status post left total knee replacement in 2001.  3.  Status post cholecystectomy.  4.  Status post tonsillectomy.  5.  She has had a left wrist ganglion resection as well.  6.  The patient had a cardiac catheterization done on October 2005.  It      showed some mild coronary artery disease.   PAST MEDICAL HISTORY:  1.  Morbid obesity as noted.  2.  Degenerative joint disease.  3.  Bronchial asthma.  4.  History of kidney stones as well.  5.  The patient has been followed at [facility] for psoriasis      management as well.  6.  She also has a history of anxiety disorder.   The patient has multiple allergies.  She  states she is allergic to:  1.  KEFLEX.  2.  PENICILLIN.  3.  SULFUR.  4.  FLOXIN.  5.  ERYTHROMYCIN.  6.  CODEINE.  7.  TENORMIN.  8.  PROZAC.  9.  CELEXA.  10. LATEX.   PRESENT MEDICATIONS:  1.  Isosorbide mononitrate 30 mg every day.  2.  Norvasc 5 mg every day.  3.  Toprol XL, dose unknown.  4.  Atrovent inhaler two puffs q.i.d. p.r.n.  5.  Albuterol sulfate q.i.d. p.r.n.  6.  Avalide.  7.  ___________.  She did not bring her doses in for review.   SOCIAL HISTORY:  The patient is married, has two adult children.  She lives  with her husband.  The patient smoked previously one pack per month for 1-2  years and quit 25 years ago.   REVIEW OF SYSTEMS:  As noted above otherwise.   PHYSICAL EXAMINATION:  GENERAL:  She is a well-developed, well-nourished,  overweight, black female in no acute distress.  VITAL SIGNS:  Height is 63 inches, weight 247 pounds.  Blood pressure  164/90, pulse of 62, respiratory rate 18, temperature 98.2.  HEENT:  Head  normocephalic, atraumatic.  Extraocular muscles are intact.  There is no sinus tenderness.  TMs are clear.  Nose, mild turbinate edema.  Throat culture.  Pharynx clear.  NECK:  Supple.  No enlarged thyroid.  No carotid bruits appreciated.  LUNGS:  Distant breath sounds without wheezes.  No E to A changes.  No CVA  tenderness.  CARDIOVASCULAR:  Normal S1 S2.  No S3.  A 1/6 systolic ejection murmur  noted.  ABDOMEN:  Bowel sounds are present.  No enlarged liver or spleen, masses, or  tenderness.  Abdomen is obese.  EXTREMITIES:  Negative Homan's.  No edema presently.  Strength is intact.  NEUROLOGIC:  She is alert, oriented to person, place, negative time.  She  has obvious expressive dysphagia with some dysarthria, some receptive  component as well.  The patient has good rapid alternating movements of the  left hand which is dominant.  Decreased rapid alternating movement in the  right hand.  DTRs are overall symmetric.  She does have difficulty  expressing and/or repeating given phrases.  She, however, is far different  from her baseline.  SKIN:  She has old psoriatic plaques on her arms.   LABORATORY DATA:  Still pending at this time.  She was noted to have a  potassium of 3.4 on her last visit to the emergency room.  A CT scan  demonstrated a pontine infarct of indeterminate age likely representing  subacute to remote.  She had a remote left occipital infarct as well.   IMPRESSION:  1.  Recurring TIAs with significant dysphagia subsequently improving.  2.  Cerebrovascular disease with documented pontine infarct noted.  3.  Sleep apnea with a history of noncompliance for greater than several      months.  This also may put her at increased risk for neurologic event.  4.  Morbid obesity without significant change.  5.  Asthma.  6.  Mild coronary artery disease, medically managed.  7.  Previous personality disorder.   PLAN: 1.  The patient is admitted for further stabilization of  her speech and rule      out further progression of a CVA.  2.  She will be placed on Lovenox per protocol.  3.  She has been started on aspirin therapy.  4.  Consideration for Plavix  as well.  5.  The patient is in need of OT PT evaluation and therapy.  6.  Continue to monitor and adjust blood pressure medication as necessary.  7.  The patient is presently not safe to ambulate.  8.  Neurologic evaluation pending results of the above.           ______________________________  Lind Guest. August [name], M.D.     ELD/MEDQ  D:  11/09/2005  T:  11/09/2005  Job:  [id number]

## 2011-04-26 NOTE — Discharge Summary (Signed)
NAME:  [name], [name]                       ACCOUNT NO.:  [id number]   MEDICAL RECORD NO.:  [id number]                   PATIENT TYPE:  INP   LOCATION:  4737                                 FACILITY:  [facility]   PHYSICIAN:  [name] L. [name], M.D.                  DATE OF BIRTH:  [birth year]   DATE OF ADMISSION:  10/20/2002  DATE OF DISCHARGE:  10/27/2002                                 DISCHARGE SUMMARY   DISCHARGE DIAGNOSES:  1. Chest pain.  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 Congestive heart failure.  428.0  4. Hypertension.  401.9  5. Asthma without status asthmaticus.  493.0  6. Morbid obesity.  278.01  7. Osteoarthrosis.  715.90  8. Anxiety state.  300.00   PROCEDURE:  1. Left cardiac catheterization per Dr. Sharyn Lull with a left heart angiogram.   HISTORY OF PRESENT ILLNESS:  This is one of several [facility]  admissions for this 60 year old married black female with longstanding  history of morbid obesity, degenerative joint disease, and hypertension,  with intermittent asthma, who was admitted complaining of progressive  dyspnea, weakness, and nausea and vomiting.  The patient states she had been  at her baseline until approximately one week ago.  At that time she noted  increasing dyspnea without sudden change in activity. She was seen in the  emergency room for evaluation.  She did not find anything acutely at that  time. The patient continued to have dyspnea and was seen in the office for  follow-up. She was noted to have some hyperventilation with symptoms being  controlled with a weak breathing technique.  The patient however, continued  to have increasing dyspnea with occasional substernal heaviness in the  chest. This was associated with sudden diaphoresis and progressive weakness.  The patient was subsequently admitted to the hospital after presenting to  the office with severe weakness.   PAST MEDICAL HISTORY:   PHYSICAL EXAMINATION:  Per  admission H&P.   HOSPITAL COURSE:  The patient was admitted for further evaluation of her  atypical chest pain. Question of atypical angina versus other.  She was  noted to have mild ST segment changes in anterior leads. She was also noted  to have mild elevation in blood pressure at the time of presentation. There  was evidence for asthma as well.  The patient was admitted to telemetry. She  did undergo cardiac enzymes x3 which was negative for MI.  She was placed on  nebulizers as well as supplemental O2 for control of asthma.  In view of her  chest symptoms and high blood pressure, she was started on IMDUR as well as  Cardizem. She was placed on Tequin for her bronchitis as noted.  She also  underwent incentive spirometry on acute basis.   The patient was seen in evaluation by Dr. Sharyn Lull of cardiology.  It was  felt that  her symptoms as well as EKG were suspicious enough that coronary  artery disease would need to be excluded.  She was started on a baby aspirin  and Lovenox as well.  The patient subsequently underwent Adenosine  Cardiolite study.  This demonstrated suggestion of an inferolateral wall  ischemia. She was noted to have an ejection fraction of 31%.  This was  reviewed with the patient and it was subsequently deemed necessary to  proceed with cardiac catheterization.  This was performed on October 26, 2002, per Dr. Sharyn Lull.  Notably, she was found to have good LV function by  cardiac catheterization and by two-dimensional echocardiogram findings. She  had an ejection fraction of 50 to 55%.  There was a 10 to 15% mid stenosis  of the LAD.  Right coronary artery has 30 to 40% proximal and the left  circumflex very minimal disease.  It was felt that the patient would be best  managed medically.   The patient had further adjustments of cardiac medications which she  tolerated well.  On ambulation, she still had significant exertional  dyspnea.  This was felt to be  secondary to some deconditioning as well.  Eventually with continued therapy, the patient's status did improve.  She  did undergo nutritional counseling.  She was also seen by mobility team.   By November 19, the patient was felt to be stable for discharge.   DISCHARGE MEDICATIONS:  1. Advair 250/50 one puff b.i.d.  2. Spironolactone 25 mg daily.  3. Furosemide 40 mg daily.  4. Protonix 40 mg q.a.m.  5. K-Dur 20 mEq b.i.d.  6. Folate 3.125 mg b.i.d.  7. Avapro 300 mg daily.  8. Alprazolam 0.25 mg t.i.d. p.r.n.  9. Ventolin MDI two puffs q.i.d. p.r.n.  10.      Atrovent MDI two puffs q.i.d.  11.      Enteric-coated aspirin 81 mg daily.   DIET:  She will be maintained on a 4 gram sodium, 1600 calorie diet.   FOLLOW UP:  Call our office for appointment in two weeks' time.                                               [name] L. [name], M.D.    ELD/MEDQ  D:  12/29/2002  T:  12/30/2002  Job:  [id number]

## 2011-04-26 NOTE — Op Note (Signed)
NAME:  [name], [name]             ACCOUNT NO.:  [id number]   MEDICAL RECORD NO.:  [id number]          PATIENT TYPE:  INP   LOCATION:  3036                         FACILITY:  [facility]   PHYSICIAN:  Hilda Lias, M.D.   DATE OF BIRTH:  1951/11/24   DATE OF PROCEDURE:  DATE OF DISCHARGE:                                 OPERATIVE REPORT   PREOPERATIVE DIAGNOSES:  1. Degenerative disk disease L4-L5, L5-S1.  2. Psoriasis.  3. Obesity.   POSTOPERATIVE DIAGNOSES:  1. Degenerative disk disease L4-L5, L5-S1.  2. Psoriasis.  3. Obesity.   PROCEDURE:  Bilateral L4-L5, L5-S1 diskectomy, interbody fusion with cage,  pedicle screws 4, 5, 1 bilaterally.  Posterior arthrodesis with bone  morphogenic protein (BMP) and autograft.  CellSaver.  C-arm.   SURGEON:  Hilda Lias, M.D.   ASSISTANT:  Cristi Loron, M.D.   CLINICAL HISTORY:  The patient had been complaining back pain with radiation  to both legs.  The patient has a history of psoriasis.  X-rays showed  degenerative disk disease at L4-L5, L5-S1.  The patient had gained weight  because of poor activity so she was advised.  The risks were explained in  the history and physical.   PROCEDURE:  The patient was taken to the OR, and she was positioned prone on  the bed.  The back was prepped with DuraPrep.  Drapes were applied.  A  midline incision was made through the skin, through a thick fat tissue layer  all the way down through the lumbar spine.  We retracted the muscle  laterally, and, after quite a bit of dissection, we took an x-ray which  showed that indeed we were at the L4-L5 and L5-S1.  From then on, we removed  the __________ portion of L4-L5, with a bilateral laminectomy and  fasciectomy.  We entered the disk space bilaterally, and total diskectomy  was achieved.  The patient had quite a bit of scar tissue, mostly in the L5-  S1, left more than the right one.  From then on, using the C-arm, autograft  with total  diskectomy was removal with the endplate, we introduced two  cages, 10 x 22 at the level of L5-S1 and 12 x 22 at the level of L4-L5 .  The cages had autograft plus BMP.  Then using the C-arm with the AP and  lateral view, we localized the pedicle at 4, 5 and 1.  Pedicle __________  were inserted, followed by 6 screws, 5.5 x 50 at the level of S1, and 5.5 x  45 at L4-L5.  This was corrected with the __________ and secured in place  with caps.  From then on, we removed the periosteum of the  __________ L4-L5, and the ala of the sacrum. A mixture of autograft and BMP  was used for fusion.  From then on, the area was thoroughly irrigated.  Plenty of space left for the L4-L5 and S1 nerve root.  __________ was left  in the __________ space and the wound was closed with Vicryl and Steri-  Strips.  ______________________________  Hilda Lias, M.D.     EB/MEDQ  D:  10/28/2006  T:  10/29/2006  Job:  [id number]

## 2011-05-02 ENCOUNTER — Encounter: Payer: Self-pay | Admitting: Internal Medicine

## 2011-05-02 ENCOUNTER — Ambulatory Visit (INDEPENDENT_AMBULATORY_CARE_PROVIDER_SITE_OTHER): Payer: Medicare HMO | Admitting: Internal Medicine

## 2011-05-02 VITALS — BP 156/98 | HR 131 | Ht 63.0 in | Wt 306.2 lb

## 2011-05-02 DIAGNOSIS — G4733 Obstructive sleep apnea (adult) (pediatric): Secondary | ICD-10-CM

## 2011-05-02 DIAGNOSIS — E662 Morbid (severe) obesity with alveolar hypoventilation: Secondary | ICD-10-CM

## 2011-05-02 NOTE — Assessment & Plan Note (Addendum)
She has been having more breathing trouble at night because she replaced CPAP with O2 instead of combining them.Unfortunately she is working with 2 DMEs. I will see if Dr [name] is ok with changing so she gets both O2 and CPAP from [facility]- her preference.

## 2011-05-02 NOTE — Assessment & Plan Note (Signed)
Discussed weight and peripheral edema- support hose.

## 2011-05-02 NOTE — Patient Instructions (Signed)
I will check with Dr [name] about combining O2 with CPAP through [facility], so you don't have 2 home care companies.   Try wearing elastic support hose- ask a medical supply place or your drug store for easy ones to start with.

## 2011-05-02 NOTE — Progress Notes (Signed)
  Subjective:    Patient ID: [name], female    DOB: [birth year], 60 y.o.   MRN: [id number]  HPI 05/02/11- 55 yoF former smoker, followed for sleep apnea, complicated by obesity/ hypoventilation syndrome, AFib, HBP, hx CHF, hx CVA. Last here Decmber 15, 2011. Note reviewed. Since last here Dr [name] ordered oxygen through [facility] and she is using that instead of CPAP which is from [place]. She wasn't shown how to join O2 to CPAP. Was using CPAP every night before that. Now having to sleep up in chair, wakes gasping.  Asthma control worse in last 2 days- felt weather change - using inhalers more. Notes some pain right shoulder area with deep breath. Saw dermatologist for stasis changes in legs. Review of Systems Constitutional:   No weight loss, night sweats,  Fevers, chills, fatigue, lassitude. HEENT:   No headaches,  Difficulty swallowing,  Tooth/dental problems,  Sore throat,                No sneezing, itching, ear ache, nasal congestion, post nasal drip,   CV:  No chest pain, anasarca, dizziness, palpitations  GI  No heartburn, indigestion, abdominal pain, nausea, vomiting, diarrhea, change in bowel habits, loss of appetite  Resp:.  No excess mucus,  No coughing up of blood.  No change in color of mucus.  No wheezing.    Skin: no rash or lesions.  GU: no dysuria, change in color of urine, no urgency or frequency.  No flank pain.  MS:  No joint pain or swelling.  No decreased range of motion.  No back pain.  Psych:  No change in mood or affect. No depression or anxiety.  No memory loss.      Objective:   Physical Exam .General- Alert, Oriented, Affect-appropriate, Distress- none acute    morbidly obese  Skin- rash-none, lesions- none, excoriation- none  Lymphadenopathy- none  Head- atraumatic  Eyes- Gross vision intact, PERRLA, conjunctivae clear secretions  Ears- Hearing, canals, Tm - normal  Nose- Clear, No- Septal dev, mucus, polyps, erosion, perforation    Throat- Mallampati II , mucosa clear , drainage- none, tonsils- atrophic  Neck- flexible , trachea midline, no stridor , thyroid nl, carotid no bruit  Chest - symmetrical excursion , unlabored     Heart/CV- RRR , no murmur , no gallop  , no rub, nl s1 s2                     - JVD- none , edema-brawny with chronic stasis changes, varices- none     Lung- clear to P&A, wheeze- none, cough- none , dullness-none, rub- none     Chest wall-   Abd- tender-no, distended-no, bowel sounds-present, HSM- no  Br/ Gen/ Rectal- Not done, not indicated  Extrem- cyanosis- none, clubbing, none, atrophy- none, strength- nl  Neuro- grossly intact to observation            Assessment & Plan:

## 2011-05-07 NOTE — Progress Notes (Signed)
Addended by: Ronny Bacon on: 05/07/2011 05:46 PM   Modules accepted: Orders

## 2011-05-13 ENCOUNTER — Telehealth: Payer: Self-pay | Admitting: Internal Medicine

## 2011-05-13 NOTE — Telephone Encounter (Signed)
Pt states she was returning a call from someone about [facility]. I called Bjorn Loser and she was waiting for pt call. Call transferred to [facility]. Carron Curie, CMA

## 2011-06-02 ENCOUNTER — Emergency Department (HOSPITAL_COMMUNITY): Payer: Medicare HMO

## 2011-06-02 ENCOUNTER — Inpatient Hospital Stay (HOSPITAL_COMMUNITY)
Admission: EM | Admit: 2011-06-02 | Discharge: 2011-06-24 | DRG: 539 | Disposition: A | Discharge status: Skilled Nursing Facility | Payer: Medicare HMO | Attending: Internal Medicine | Admitting: Internal Medicine

## 2011-06-02 DIAGNOSIS — M109 Gout, unspecified: Secondary | ICD-10-CM | POA: Diagnosis present

## 2011-06-02 DIAGNOSIS — E119 Type 2 diabetes mellitus without complications: Secondary | ICD-10-CM | POA: Diagnosis present

## 2011-06-02 DIAGNOSIS — Z8614 Personal history of Methicillin resistant Staphylococcus aureus infection: Secondary | ICD-10-CM

## 2011-06-02 DIAGNOSIS — Z96659 Presence of unspecified artificial knee joint: Secondary | ICD-10-CM

## 2011-06-02 DIAGNOSIS — N183 Chronic kidney disease, stage 3 unspecified: Secondary | ICD-10-CM | POA: Diagnosis present

## 2011-06-02 DIAGNOSIS — Z91199 Patient's noncompliance with other medical treatment and regimen due to unspecified reason: Secondary | ICD-10-CM

## 2011-06-02 DIAGNOSIS — J4489 Other specified chronic obstructive pulmonary disease: Secondary | ICD-10-CM | POA: Diagnosis present

## 2011-06-02 DIAGNOSIS — N179 Acute kidney failure, unspecified: Secondary | ICD-10-CM | POA: Diagnosis present

## 2011-06-02 DIAGNOSIS — Z9119 Patient's noncompliance with other medical treatment and regimen: Secondary | ICD-10-CM

## 2011-06-02 DIAGNOSIS — I5033 Acute on chronic diastolic (congestive) heart failure: Secondary | ICD-10-CM | POA: Diagnosis present

## 2011-06-02 DIAGNOSIS — J449 Chronic obstructive pulmonary disease, unspecified: Secondary | ICD-10-CM | POA: Diagnosis present

## 2011-06-02 DIAGNOSIS — I509 Heart failure, unspecified: Secondary | ICD-10-CM | POA: Diagnosis present

## 2011-06-02 DIAGNOSIS — G4733 Obstructive sleep apnea (adult) (pediatric): Secondary | ICD-10-CM | POA: Diagnosis present

## 2011-06-02 DIAGNOSIS — Z880 Allergy status to penicillin: Secondary | ICD-10-CM

## 2011-06-02 DIAGNOSIS — L408 Other psoriasis: Secondary | ICD-10-CM | POA: Diagnosis present

## 2011-06-02 DIAGNOSIS — M009 Pyogenic arthritis, unspecified: Secondary | ICD-10-CM | POA: Diagnosis present

## 2011-06-02 DIAGNOSIS — I4892 Unspecified atrial flutter: Secondary | ICD-10-CM | POA: Diagnosis present

## 2011-06-02 DIAGNOSIS — L02419 Cutaneous abscess of limb, unspecified: Secondary | ICD-10-CM | POA: Diagnosis present

## 2011-06-02 DIAGNOSIS — M869 Osteomyelitis, unspecified: Principal | ICD-10-CM | POA: Diagnosis present

## 2011-06-02 DIAGNOSIS — I129 Hypertensive chronic kidney disease with stage 1 through stage 4 chronic kidney disease, or unspecified chronic kidney disease: Secondary | ICD-10-CM | POA: Diagnosis present

## 2011-06-02 DIAGNOSIS — J9620 Acute and chronic respiratory failure, unspecified whether with hypoxia or hypercapnia: Secondary | ICD-10-CM | POA: Diagnosis present

## 2011-06-02 DIAGNOSIS — I4891 Unspecified atrial fibrillation: Secondary | ICD-10-CM | POA: Diagnosis present

## 2011-06-02 LAB — PHOSPHORUS: Phosphorus: 4 mg/dL (ref 2.3–4.6)

## 2011-06-02 LAB — DIFFERENTIAL
Basophils Relative: 0 % (ref 0–1)
Eosinophils Absolute: 0.1 10*3/uL (ref 0.0–0.7)
Lymphs Abs: 0.4 10*3/uL — ABNORMAL LOW (ref 0.7–4.0)
Monocytes Absolute: 0.7 10*3/uL (ref 0.1–1.0)
Monocytes Relative: 4 % (ref 3–12)
Neutrophils Relative %: 93 % — ABNORMAL HIGH (ref 43–77)

## 2011-06-02 LAB — CBC
Hemoglobin: 11.9 g/dL — ABNORMAL LOW (ref 12.0–15.0)
MCH: 27.7 pg (ref 26.0–34.0)
MCHC: 31.8 g/dL (ref 30.0–36.0)
MCV: 87 fL (ref 78.0–100.0)
Platelets: 224 10*3/uL (ref 150–400)
RBC: 4.3 MIL/uL (ref 3.87–5.11)

## 2011-06-02 LAB — PROTIME-INR: Prothrombin Time: 19.5 seconds — ABNORMAL HIGH (ref 11.6–15.2)

## 2011-06-02 LAB — URINE MICROSCOPIC-ADD ON

## 2011-06-02 LAB — BASIC METABOLIC PANEL
Calcium: 10 mg/dL (ref 8.4–10.5)
GFR calc non Af Amer: 41 mL/min — ABNORMAL LOW (ref >60)
Potassium: 4.3 mEq/L (ref 3.5–5.1)
Sodium: 139 mEq/L (ref 135–145)

## 2011-06-02 LAB — URINALYSIS, ROUTINE W REFLEX MICROSCOPIC
Leukocytes, UA: NEGATIVE
Nitrite: NEGATIVE
Protein, ur: 100 mg/dL — AB
Specific Gravity, Urine: 1.022 (ref 1.005–1.030)
Urobilinogen, UA: 0.2 mg/dL (ref 0.0–1.0)

## 2011-06-02 LAB — CARDIAC PANEL(CRET KIN+CKTOT+MB+TROPI): Total CK: 94 U/L (ref 7–177)

## 2011-06-02 LAB — COMPREHENSIVE METABOLIC PANEL
Albumin: 3.1 g/dL — ABNORMAL LOW (ref 3.5–5.2)
Alkaline Phosphatase: 76 U/L (ref 39–117)
BUN: 25 mg/dL — ABNORMAL HIGH (ref 6–23)
Calcium: 8.9 mg/dL (ref 8.4–10.5)
Creatinine, Ser: 1.37 mg/dL — ABNORMAL HIGH (ref 0.50–1.10)
GFR calc Af Amer: 47 mL/min — ABNORMAL LOW (ref >60)
Glucose, Bld: 247 mg/dL — ABNORMAL HIGH (ref 70–99)
Total Protein: 6.8 g/dL (ref 6.0–8.3)

## 2011-06-02 LAB — MRSA PCR SCREENING: MRSA by PCR: NEGATIVE

## 2011-06-02 LAB — MAGNESIUM: Magnesium: 1.7 mg/dL (ref 1.5–2.5)

## 2011-06-02 LAB — CK TOTAL AND CKMB (NOT AT ARMC)
CK, MB: 2.3 ng/mL (ref 0.3–4.0)
Total CK: 118 U/L (ref 7–177)

## 2011-06-02 LAB — APTT: aPTT: 37 seconds (ref 24–37)

## 2011-06-02 LAB — TROPONIN I: Troponin I: <0.3 ng/mL (ref <0.30)

## 2011-06-03 ENCOUNTER — Inpatient Hospital Stay (HOSPITAL_COMMUNITY): Payer: Medicare HMO

## 2011-06-03 DIAGNOSIS — M79609 Pain in unspecified limb: Secondary | ICD-10-CM

## 2011-06-03 LAB — CBC
HCT: 33.8 % — ABNORMAL LOW (ref 36.0–46.0)
MCV: 89.9 fL (ref 78.0–100.0)
RBC: 3.76 MIL/uL — ABNORMAL LOW (ref 3.87–5.11)
RDW: 18.8 % — ABNORMAL HIGH (ref 11.5–15.5)
WBC: 17.9 10*3/uL — ABNORMAL HIGH (ref 4.0–10.5)

## 2011-06-03 LAB — CARDIAC PANEL(CRET KIN+CKTOT+MB+TROPI)
CK, MB: 2.4 ng/mL (ref 0.3–4.0)
Relative Index: 1.7 (ref 0.0–2.5)
Relative Index: 1.6 (ref 0.0–2.5)
Total CK: 139 U/L (ref 7–177)
Troponin I: <0.3 ng/mL (ref <0.30)

## 2011-06-03 LAB — PROTIME-INR: INR: 1.68 — ABNORMAL HIGH (ref 0.00–1.49)

## 2011-06-03 LAB — GLUCOSE, CAPILLARY: Glucose-Capillary: 163 mg/dL — ABNORMAL HIGH (ref 70–99)

## 2011-06-03 LAB — LIPID PANEL
Cholesterol: 142 mg/dL (ref 0–200)
Total CHOL/HDL Ratio: 2.6 RATIO
Triglycerides: 79 mg/dL (ref <150)

## 2011-06-03 LAB — TSH: TSH: 1.734 u[IU]/mL (ref 0.350–4.500)

## 2011-06-03 LAB — BASIC METABOLIC PANEL
BUN: 36 mg/dL — ABNORMAL HIGH (ref 6–23)
Creatinine, Ser: 2.11 mg/dL — ABNORMAL HIGH (ref 0.50–1.10)
GFR calc Af Amer: 29 mL/min — ABNORMAL LOW (ref >60)
GFR calc non Af Amer: 24 mL/min — ABNORMAL LOW (ref >60)
Potassium: 5.2 mEq/L — ABNORMAL HIGH (ref 3.5–5.1)

## 2011-06-03 NOTE — H&P (Signed)
NAME:  [name], [name] NO.:  [id number]  MEDICAL RECORD NO.:  [id number]  LOCATION:  WLED                         FACILITY:  [facility]  PHYSICIAN:  Standley Dakins, MD   DATE OF BIRTH:  11-09-1951  DATE OF ADMISSION:  06/02/2011 DATE OF DISCHARGE:                             HISTORY & PHYSICAL   PRIMARY CARE PHYSICIAN:  [name] L. [name], M.D.  CARDIOLOGISTEduardo Osier Sharyn Lull, M.D.  NEPHROLOGIST:  Lindaann Slough, M.D.  CHIEF COMPLAINT:  Left leg pain.  HISTORIAN:  Patient and husband.  HISTORY OF PRESENT ILLNESS:  This patient is a 60 year old morbidly obese female with hypertension, congestive heart failure, sleep apnea, diabetes mellitus, who presented to the Emergency Department today after noticing 2 days of worsening left lower extremity pain.  The patient reports that it started yesterday as a generalized aching pain in the left buttocks and leg.  She reports that it subsided and she went to church today.  She reports that when she got up from church she noticed more pain in the left leg and eventually the pain progressed to the point where she could not bear weight on that left leg.  The patient reports that she did not take any of her medications today.  Her husband reports to me that the patient has not been following her physicians recommendations and has been eating salty snacks like pork skins for the  last 2 days in large quantities.    The patient reports that she has had no fever or chills.  When she arrived in the Emergency Department today, she was noted to have a fever.  The patient had a fever 102.  The patient also had an elevated white blood cell count.  The patient had noticeable edema and redness in the left lower extremity.  She was x-rayed and no fractures detected.  Patient also has a history of gout, but reports that she has not had any attacks in the past several years.  Hospital admission was requested for treatment of the presumed  cellulitis in the left lower extremity.  In addition, the patient was noted to have atrial flutter with the heart rate of approximately 120.  Patient says she has not been able to take her cardiac medications recently.  She was started on a diltiazem drip in the Emergency Department.  She is going to be admitted for rate control and for cardiovascular evaluation with her cardiologist, Dr. Sharyn Lull.  PAST MEDICAL HISTORY: 1. Congestive heart failure. 2. Obesity hypoventilation syndrome. 3. Atrial fibrillation and atrial flutter. 4. Psoriasis. 5. Morbid obesity. 6. Chronic debility. 7. Diabetes mellitus type 2, poorly controlled. 8. Chronic pulmonary heart disease. 9. History of tobacco abuse. 10.Status post joint knee replacement. 11.History of hematuria. 12.Long history of poor medical compliance. 13.History of respiratory failure and diastolic heart failure. 14.Chronic renal insufficiency. 15.Weightbearing arthropathy 16.Cerebrovascular disease. 17.Chronic anticoagulation with warfarin.  PAST SURGICAL HISTORY: 1. Back surgery. 2. Hysterectomy. 3. Knee replacement. 4. Left arm surgery.  HOME MEDICATIONS:  Patient did not bring her home medications and was not able to recall her home medications.  From previous records and documentation I was able to learn that she has been previously  prescribed: 1. Warfarin. 2. Lasix. 3. Metoprolol tartrate. 4. Metformin. 5. NovoLog insulin. 6. Lantus insulin. 7. Lactulose. 8. Xopenex. 9. Spironolactone. 10.Ambien. 11.Advair Diskus 250/50. 12.Aspirin. 13.Crestor. 14.Flovent. 15.Methotrexate. 16.Potassium citrate. 17.Ventolin inhaler. 18.Zantac.  ALLERGIES: 1. ERYTHROMYCIN. 2. CODEINE. 3. PENICILLINS. 4. FLUOXETINE. 5. OFLOXACIN. 6. ATENOLOL. 7. CELECOXIB. 8. SULFA. 9. CEPHALOSPORINS.  FAMILY HISTORY:  Significant for diabetes mellitus, coronary artery disease, obesity and renal disease.  SOCIAL HISTORY:  The patient  currently resides with her husband of 41 years.  She denies any current tobacco, alcohol or recreational drug use, but has a history of heavy tobacco use.  Patient is currently disabled.  REVIEW OF SYSTEMS:  Significant for all the systems and symptoms listed in the HPI, otherwise all systems reviewed completely and reported as negative.  PHYSICAL EXAMINATION:  CURRENT VITAL SIGNS:  Temperature of 102.0 rectal, pulse 116, respirations 22, blood pressure 157/93, pulse ox 95% on room air. GENERAL:  This is a morbidly obese female.  She is lying on her right side, well-developed, uncomfortable-appearing. HEENT:  Normocephalic, atraumatic.  Poor dentition.  Dry mucous membranes.  Eyes reveal no scleral icterus.  Nares clear. NECK:  Supple.  Full range of motion.  No lymphadenopathy.  Thyroid no nodules or masses palpated. CHEST EXAMINATION:  Lungs: Bilateral breath sounds shallow with occasional expiratory wheezes heard. CARDIOVASCULAR:  Irregular rhythm, tachycardic.  Distant heart sounds. No murmurs heard. ABDOMEN:  Super obese, nontender, nondistended.  No masses palpated. Normal bowel sounds heard. GU:  No CVA tenderness. EXTREMITIES:  3+ to 4+ edema bilateral lower extremities, 4+ pedal edema bilateral feet.  Patient has chronic venostasis dermatitis and several shallow draining ulcers noted on the left lower extremity with redness noted.  Examination of the left hip reveals tenderness to palpation overlying the left buttocks area and no erythema seen.  No ecchymosis or wounds seen. NEUROLOGICAL EXAMINATION:  Awake, alert and oriented x3.  No focal deficits.  Moving all four extremities. SKIN EXAMINATION:  As mentioned above.  LABORATORY DATA:  Sodium 139, potassium 4.3, chloride 102, bicarb 25, BUN 31, creatinine 1.33, estimated GFR 49.  Total CK 118, troponin less than 0.30, lactic acid 1.1.  PT 19.5, INR 1.62.  White blood cell count 15.6, hemoglobin 11.9, hematocrit 37.4,  platelet count 224,000.  BNP 1301.  IMAGING STUDIES:  Chest x-ray significant for cardiomegaly and vascular congestion.  X-ray of the lumbar spine four-view stable postop changes, facet joint degenerative changes of the lumbar spine.  Hip x-ray, no acute bony findings seen.  IMPRESSION: 1. This is a 60 year old morbidly obese female with atrial     fibrillation flutter, uncontrolled rate, currently on intravenous     diltiazem. 2. Severe left lower extremity pain. 3. Cellulitis of the left lower extremity. 4. Congestive heart failure with acute exacerbation secondary to     excessive sodium consumption. 5. Diabetes mellitus type 2, poorly controlled. 6. Gout. 7. Chronic obstructive pulmonary disease. 8. Hypertension. 9. Degenerative joint disease. 10.Sleep apnea. 11.Chronic renal insufficiency. 12.Poor Compliance with medical treatments and recommendations.  PLAN: 1. The patient is going to be admitted to the step-down unit and     maintained on the intravenous diltiazem drip to get her heart rate     under better control.  We are going to resume her home medications     once they have been reconciled.  We may need to call the patient's     pharmacy in the morning because the patient is not able to tell us  at this time what medications she is supposed to be taking. 2. I have ordered for her to start a beta blocker. 3. Continue warfarin per pharmacy protocol, monitor PT, INR closely. 4. Monitor blood glucose closely and treat with sliding scale     supplemental insulin coverage. 5. The patient has been started on vancomycin IV to treat the     cellulitis infection. 6. Blood cultures have been taken.  Follow CBC. 7. The patient will be diuresed with IV furosemide.  In's and out's     will be monitored and electrolytes will be monitored closely as     well. 8. I have asked respiratory therapy to set up CPAP. 9. Oxygen supplementation has been ordered. 10.We will follow  cultures. 11.Consult patient's cardiologist tomorrow, Dr. Sharyn Lull. 12.This is very important that we get her home medications reconciled. 13.Continue to follow closely and make adjustments to medical care as     required.     Standley Dakins, MD     CJ/MEDQ  D:  06/02/2011  T:  06/02/2011  Job:  [id number]  cc:   Minerva Areola L. [name], M.D. [address] [address] [place] [address]  Eduardo Osier. Sharyn Lull, M.D. Fax: [phone]  Lindaann Slough, M.D. Fax: [phone]  Electronically Signed by Standley Dakins  on 06/03/2011 12:18:25 PM

## 2011-06-04 LAB — GLUCOSE, CAPILLARY
Glucose-Capillary: 172 mg/dL — ABNORMAL HIGH (ref 70–99)
Glucose-Capillary: 181 mg/dL — ABNORMAL HIGH (ref 70–99)

## 2011-06-04 LAB — PROTIME-INR: INR: 2.02 — ABNORMAL HIGH (ref 0.00–1.49)

## 2011-06-04 LAB — CBC
HCT: 32.1 % — ABNORMAL LOW (ref 36.0–46.0)
Hemoglobin: 9.8 g/dL — ABNORMAL LOW (ref 12.0–15.0)
MCV: 90.2 fL (ref 78.0–100.0)
WBC: 15.4 10*3/uL — ABNORMAL HIGH (ref 4.0–10.5)

## 2011-06-04 LAB — SYNOVIAL CELL COUNT + DIFF, W/ CRYSTALS
Eosinophils-Synovial: 7 % — ABNORMAL HIGH (ref 0–1)
Neutrophil, Synovial: 67 % — ABNORMAL HIGH (ref 0–25)
WBC, Synovial: 147697 /mm3 — ABNORMAL HIGH (ref 0–200)

## 2011-06-04 LAB — URIC ACID: Uric Acid, Serum: 12 mg/dL — ABNORMAL HIGH (ref 2.4–7.0)

## 2011-06-04 LAB — BASIC METABOLIC PANEL
CO2: 28 mEq/L (ref 19–32)
Chloride: 100 mEq/L (ref 96–112)
Sodium: 137 mEq/L (ref 135–145)

## 2011-06-04 LAB — PRO B NATRIURETIC PEPTIDE: Pro B Natriuretic peptide (BNP): 3938 pg/mL — ABNORMAL HIGH (ref 0–125)

## 2011-06-04 LAB — URINE CULTURE

## 2011-06-05 ENCOUNTER — Ambulatory Visit (HOSPITAL_COMMUNITY)
Admission: RE | Admit: 2011-06-05 | Discharge: 2011-06-05 | Disposition: A | Discharge status: Home / Self Care | Payer: Medicare HMO | Source: Ambulatory Visit | Attending: Cardiovascular Disease | Admitting: Cardiovascular Disease

## 2011-06-05 DIAGNOSIS — I4891 Unspecified atrial fibrillation: Secondary | ICD-10-CM | POA: Insufficient documentation

## 2011-06-05 DIAGNOSIS — Z01810 Encounter for preprocedural cardiovascular examination: Secondary | ICD-10-CM | POA: Insufficient documentation

## 2011-06-05 DIAGNOSIS — T8450XA Infection and inflammatory reaction due to unspecified internal joint prosthesis, initial encounter: Secondary | ICD-10-CM

## 2011-06-05 DIAGNOSIS — I4892 Unspecified atrial flutter: Secondary | ICD-10-CM | POA: Insufficient documentation

## 2011-06-05 DIAGNOSIS — J9600 Acute respiratory failure, unspecified whether with hypoxia or hypercapnia: Secondary | ICD-10-CM

## 2011-06-05 LAB — DIFFERENTIAL
Basophils Absolute: 0 10*3/uL (ref 0.0–0.1)
Eosinophils Absolute: 0 10*3/uL (ref 0.0–0.7)
Eosinophils Relative: 0 % (ref 0–5)
Lymphocytes Relative: 2 % — ABNORMAL LOW (ref 12–46)
Lymphs Abs: 0.3 10*3/uL — ABNORMAL LOW (ref 0.7–4.0)
Neutrophils Relative %: 96 % — ABNORMAL HIGH (ref 43–77)

## 2011-06-05 LAB — BLOOD GAS, ARTERIAL
Bicarbonate: 25 mEq/L — ABNORMAL HIGH (ref 20.0–24.0)
Drawn by: 129801
O2 Content: 4 L/min
pCO2 arterial: 42.5 mmHg (ref 35.0–45.0)
pH, Arterial: 7.387 (ref 7.350–7.400)
pO2, Arterial: 62.2 mmHg — ABNORMAL LOW (ref 80.0–100.0)

## 2011-06-05 LAB — GLUCOSE, CAPILLARY
Glucose-Capillary: 181 mg/dL — ABNORMAL HIGH (ref 70–99)
Glucose-Capillary: 183 mg/dL — ABNORMAL HIGH (ref 70–99)
Glucose-Capillary: 178 mg/dL — ABNORMAL HIGH (ref 70–99)

## 2011-06-05 LAB — VANCOMYCIN, RANDOM
Vancomycin Rm: 21.5 ug/mL
Vancomycin Rm: 18.2 ug/mL

## 2011-06-05 LAB — CBC
HCT: 30.7 % — ABNORMAL LOW (ref 36.0–46.0)
Platelets: 180 10*3/uL (ref 150–400)
RBC: 3.38 MIL/uL — ABNORMAL LOW (ref 3.87–5.11)
RDW: 19 % — ABNORMAL HIGH (ref 11.5–15.5)
WBC: 13.9 10*3/uL — ABNORMAL HIGH (ref 4.0–10.5)

## 2011-06-05 LAB — BASIC METABOLIC PANEL
CO2: 28 mEq/L (ref 19–32)
Chloride: 96 mEq/L (ref 96–112)
GFR calc Af Amer: 20 mL/min — ABNORMAL LOW (ref >60)
Potassium: 4.9 mEq/L (ref 3.5–5.1)

## 2011-06-05 LAB — CREATININE, URINE, RANDOM: Creatinine, Urine: 88.83 mg/dL

## 2011-06-05 NOTE — Consult Note (Signed)
[name], [name]             ACCOUNT NO.:  [id number]  MEDICAL RECORD NO.:  [id number]  LOCATION:  ENDO                         FACILITY:  [facility]  PHYSICIAN:  Aram Beecham B. Eliott Nine, M.D.DATE OF BIRTH:  [birth year]  DATE OF CONSULTATION: DATE OF DISCHARGE:                                CONSULTATION   Renal Consultation  REASON FOR CONSULTATION:  We were asked by Dr. Waymon Amato to see this patient because of acute kidney injury.  HISTORY OF PRESENT ILLNESS:  She is a 60 year old African American woman with a history of morbid obesity, hypertension, diastolic heart failure, obesity hypoventilation syndrome, diabetes and gout.  She has had a remote history of a left total knee replacement with chronic infection that required some hardware removal in 2002.  She was admitted on June 02, 2011 with left leg pain and cellulitis and question of a septic knee.  She was febrile and had 147,697 white cells in the fluid tapped from her knee.  There is also a question of whether this represents acute gouty arthritis as there were also intracellular monosodium urate crystals noted.  She was in atrial flutter on admission and required IV Diltiazem and was having an exacerbation of congestive heart failure secondary to dietary salt indiscretion.   Because of her atrial arrhythmias, she has just returned from a  TEE-guided cardioversion  Her serum creatinine on admission was 1.33.  Earlier data from e-chart show creatinines of 1.44 in September 2011, which was as high as 1.92 during that same admission, 1.47 in August 2011 and 1.86 in June 2011. She has had proteinuria by dipstick since 2006 by e-chart records and had a negative SPEP in June 2011.  I cannot find any previous ultrasounds  Since hospital admission, her creatinine has increased from 1.33 to 1.37 to 2.11 to 2.79 and up to 2.95 today.  She has been on vancomycin, but the dose has been reduced appropriately and her vancomycin random  level today was 21.5.  Her urinalysis is negative with the exception of 100 mg percent protein, but the microscopic is negative.  She has not been receiving ACE or ARB and has not received any contrasted studies.  Her blood pressure was in the 150-160 systolic range early in the admission and has dropped as low as 92/58, but she has had no sustained hypotension.  In the last couple of days her nadir blood pressure was around 104/80.  She has received diuretics for heart failure, but her urine output totals have not been dramatic (1550, 1375, 2451, 900 so far today), so she has clearly not been over diuresed.  PAST MEDICAL HISTORY: 1. Diabetes. 2. Hypertension. 3. History of nonobstructive coronary artery disease by cath in 2005. 4. History of diastolic heart failure with an EF of 55%-60% in June     2011. 5. AFib flutter. 6. Chronic anticoagulation with Coumadin. 7. Morbid obesity. 8. Chronic kidney disease, stage III, at baseline with estimated GFR     in the 40s, with dip stick proteinuria since 2006. 9. Obesity hypoventilation syndrome. 10.COPD. 11.Pulmonary hypertension. 12.History of noncompliance including poorly compliant with CPAP. 13.Psoriasis, on methotrexate, followed by Dr. Yetta Barre. 14.Gout, not on any uric acid lowering  therapy. 15.History of left total knee replacement in 2000 with infection and     hardware removal in 2002. 16.History of tobacco abuse. 17.History of stroke. 18.DDD with chronic radiculopathy with history of an L4-L5, L5-S1     diskectomy and fusion in 2007. 19.Status post hysterectomy, tonsillectomy, adenoidectomy,     cholecystectomy and knee replacement as previously mentioned. 20. History of nephrolithiases  ALLERGIES:  She has multiple medication allergies including: 1. ERYTHROMYCIN. 2. PENICILLIN. 3. CEPHALOSPORINS. 4. FLOXIN. 5. SULFA. 6. ATENOLOL. 7. CELEBREX. 8. FLUOXETINE.  MEDICATIONS:  Her current medicines are: 1. Aspirin 81 mg  a day. 2. Clobetasol. 3. Desonide. 4. Digoxin 0.25 mg daily. 5. Lasix 40 mg b.i.d. 6. Methotrexate 10 mg orally once a week. 7. Lopressor 50 mg t.i.d. 8. Protonix 40 mg a day. 9. Crestor 10 mg a day 10.Vancomycin per pharmacy protocol. 11.Warfarin per pharmacy protocol. 12.IV diltiazem. 13.She received a dose of Depo Medrol 40 mg IM x1.  FAMILY HISTORY:  Positive for diabetes and heart disease and she stated her grandmother, who was diabetic had renal failure prior to her death.  SOCIAL HISTORY:  Lives at home with her husband and has a prior history of tobacco, but no longer smokes.  She is disabled.  She has two children.  Mobility wise, she is not very mobile, but does walk around her house.  REVIEW OF SYSTEMS:  Positive for shortness of breath, which is chronic, but she feels that over the course today it has improved some.  She has chronic swelling in her legs and feet that was worsened prior to coming into the hospital.  She has had severe pain in the left knee, which prompted her admission.  No nausea, vomiting, diarrhea, chest pain.  No rashes, but does have chronic psoriatic skin changes.  PHYSICAL EXAMINATION:  GENERAL:  Reveals a morbidly obese, but very pleasant black female, oriented x3 and alert. VITAL SIGNS:  T-max 99.6, presently afebrile; blood pressure 131/56. She is mildly dyspneic at rest.  I cannot visualize her neck veins because of body habitus.  CARDIAC:  Rhythm is irregular, S1, S2.  No S3. Rate is around 90.  There is a very large pannus with striae that are edematous. ABDOMEN:  She has abdominal wall edema. EXTREMITIES:  There are chronic changes in both lower extremities with discoloration and darkening of the skin, 2+ edema.  She has pain with passive range of motion of the left lower extremity and there is scarring of the left knee from previous surgeries.  I cannot feel distal pulses due to edema.  LABORATORY DATA:  Laboratory from today shows  a sodium of 134, potassium 4.9, chloride 96, CO2 of 28, BUN 46, creatinine 2.95.  Hemoglobin 9.3, down from 11.9 on admission; WBC 13,900; platelets 180,000; hematocrit 30.7.  Uric acid was 12.  Vancomycin random 21.5.  PT INR 1.95, albumin 3.1.  Culture of right knee fluid is negative.  Blood cultures negative x2.  Cell count of the knee aspirate showed 147,697 white cells with 60% polys and intracellular monosodium urate crystals.  DIAGNOSTIC STUDIES:  Chest x-ray from June 03, 2011 showed cardiomegaly, interstitial edema and fluid in the fissure on the right.  IMPRESSION:  [age]-year-old African American woman, who has underlying chronic kidney disease, stage III with an estimated glomerular filtration rate in the 40s, who has had dipstick proteinuria since at least 2006 (I suspect this could be related to either diabetes or to obesity related focal segmental glomerulosclerosis).  She  was admitted with febrile illness (cellulitis versus septic joint versus acute gouty arthropathy) and congestive heart failure with atrial fib/ flutter.  She has developed acute kidney injury without a single clear-cut cause - multifactorial and is possibly primarily related to cardiorenal issues given her atrial arrhythmias.  She has not had any evidence of vancomycin toxicity.  No contrast exposure.  Has had only a few soft blood pressures and is clearly not over diuresed based on physical exam and in fact may need additional Lasix, although it appears her urine output at the present time is adequate.  RECOMMENDATIONS:  I think an ultrasound should be done especially given her history of kidney stones for completeness sake and we will check a urine protein creatinine ratio just to get an idea how much proteinuria she has, as I do not think it has ever been quantitated. I would not recommend any change in management at this juncture, although if she does not diurese a little more effectively  over the next couple of days may require higher doses of Lasix because of her heart failure and edema.  We will keep a careful eye on her serum creatinine over the next several days.  Thanks for asking [place] to see her and we will follow closely with you.     [facility] Salvia Eliott Nine, M.D.     CBD/MEDQ  D:  06/05/2011  T:  06/05/2011  Job:  [id number]  Electronically Signed by Camille Bal M.D. on 06/05/2011 10:08:44 PM

## 2011-06-06 ENCOUNTER — Inpatient Hospital Stay (HOSPITAL_COMMUNITY): Payer: Medicare HMO

## 2011-06-06 LAB — BASIC METABOLIC PANEL
BUN: 73 mg/dL — ABNORMAL HIGH (ref 6–23)
CO2: 28 mEq/L (ref 19–32)
Chloride: 97 mEq/L (ref 96–112)
GFR calc Af Amer: 22 mL/min — ABNORMAL LOW (ref >60)
Potassium: 5.3 mEq/L — ABNORMAL HIGH (ref 3.5–5.1)

## 2011-06-06 LAB — DIFFERENTIAL
Basophils Absolute: 0 10*3/uL (ref 0.0–0.1)
Basophils Relative: 0 % (ref 0–1)
Lymphocytes Relative: 2 % — ABNORMAL LOW (ref 12–46)
Monocytes Relative: 1 % — ABNORMAL LOW (ref 3–12)
Neutro Abs: 10.2 10*3/uL — ABNORMAL HIGH (ref 1.7–7.7)
Neutrophils Relative %: 97 % — ABNORMAL HIGH (ref 43–77)

## 2011-06-06 LAB — PROTEIN / CREATININE RATIO, URINE
Creatinine, Urine: 30.87 mg/dL
Protein Creatinine Ratio: 0.35 — ABNORMAL HIGH (ref 0.00–0.15)
Total Protein, Urine: 10.7 mg/dL

## 2011-06-06 LAB — IRON AND TIBC
Saturation Ratios: 16 % — ABNORMAL LOW (ref 20–55)
TIBC: 286 ug/dL (ref 250–470)

## 2011-06-06 LAB — GLUCOSE, CAPILLARY
Glucose-Capillary: 222 mg/dL — ABNORMAL HIGH (ref 70–99)
Glucose-Capillary: 182 mg/dL — ABNORMAL HIGH (ref 70–99)

## 2011-06-06 LAB — PROTIME-INR: Prothrombin Time: 25.3 seconds — ABNORMAL HIGH (ref 11.6–15.2)

## 2011-06-06 LAB — CBC
Hemoglobin: 8.7 g/dL — ABNORMAL LOW (ref 12.0–15.0)
MCH: 27 pg (ref 26.0–34.0)
RBC: 3.22 MIL/uL — ABNORMAL LOW (ref 3.87–5.11)

## 2011-06-06 LAB — PRO B NATRIURETIC PEPTIDE: Pro B Natriuretic peptide (BNP): 2932 pg/mL — ABNORMAL HIGH (ref 0–125)

## 2011-06-06 NOTE — Consult Note (Signed)
NAME:  [name], [name] NO.:  [id number]  MEDICAL RECORD NO.:  [id number]  LOCATION:  ENDO                         FACILITY:  [facility]  PHYSICIAN:  Acey Lav, MD  DATE OF BIRTH:  1951/09/06  DATE OF CONSULTATION:  06/05/2011 DATE OF DISCHARGE:  06/05/2011                                CONSULTATION   REQUESTING PHYSICIAN:  Marcellus Scott, MD  REASON FOR INFECTIOUS DISEASE CONSULTATION:  Concern for septic knee.  HISTORY OF PRESENT ILLNESS:  [name] is a 60 year old African American female with a past medical history significant for multiple medical problems including atrial fibrillation with rapid ventricular response, congestive heart failure, obesity hypoventilation syndrome, diabetes mellitus, tobacco use, and left-sided knee replacement WITH HISTORY OF PROSTHETIC KNEE INFECTION IN 2001, SP TEQUIN AND RIFAMPIN WITH TWO STAGED JOINT REPLACEMENT, AND HISTORY OF MRSA FROM KNEE WOUND IN 2007.  [name]  She was admitted on the 24th of this month with 2 days of worsening left lower extremity pain.  She was unable to support much weight on that side.  Initially when she was seen in the emergency apartment,  she was found to be febrile to 102 with elevated white blood cell count.  She was thought to have redness in her left lower extremity with concern for possible cellulitis.  She was known to have gout as well.  She was feeling ill and was in atrial flutter with a rapid response in the 120s. She was brought in to the critical care for further management.  On admission, she was started empirically on vancomycin for cellulitis.  In the interim, it was found that her knee actually was exquisitely tender to palpation and she underwent aspiration of the knee roughly 24 hours after she had been started on vancomycin.  Cell count and differential from the knee revealed 147,697 white blood cells with 67% neutrophils. Intracellular monosodium urate crystals were seen  and the patient's uric acid level was elevated at 12, consistent with her prior history of gout.  Sedimentation rate was also elevated at 74.  In the interim, her renal function has worsened and her creatinine has gone up to 2.95. This is in the setting of multiple potential renal insults including dehydration from a possible infectious disease from gout also in the setting of diuretics and also atrial fibrillation.  Her vancomycin levels have been supratherapeutic as well and her vancomycin is currently being held.  We were asked to assist in the management of this patient who has a possible prosthetic knee infection.  PAST MEDICAL HISTORY: 1. Obesity hypoventilation syndrome. 2. Congestive heart failure. 3. Diabetes mellitus. 4. Tobacco use. 5. History of left-sided knee replacement. 6. History of noncompliance. 7. History of diastolic heart failure. 8. Chronic renal insufficiency. 9. Cerebrovascular accident. 10. PROSTHETIC KNEE INFECTION IN 2001 SP TWO STAGED REPLACEMENT WITH TEQUIN AND RIFAMPIN GIVEN (NO ORGANISMS ISOLATED ON CULTURE AT THAT TIME BUT MRSA ISOLATED FROM "KNEE WOUND IN 2007  PAST SURGICAL HISTORY:  Hysterectomy, left knee replacement, left arm surgery.  FAMILY HISTORY:  Noncontributory.  SOCIAL HISTORY:  The patient resides with husband 41 years.  Denies tobacco, alcohol, or recreational drug use currently, but history of heavy  tobacco use in the past.  ALLERGIES:  Allergic to, 1. ERYTHROMYCIN. 2. CODEINE. 3. PENICILLINS. 4. FLUOXETINE. 5. OFLOXACIN. 6. ATENOLOL. 7. CELECOXIB. 8. SULFA. 9. CEPHALOSPORINS.  CURRENT MEDICATIONS:  In the hospital include amiodarone, aspirin, clobetasol, digoxin, Tridesilon, Lasix, levalbuterol, methotrexate, Solu- Medrol, metoprolol, pantoprazole, Crestor, vancomycin, warfarin, Tylenol, Ambien.  PHYSICAL EXAMINATION:  VITAL SIGNS:  The patient's temperature maximum last 24 hours is 99.6, temperature current 98.1,  blood pressure 130/56, pulse 83 with atrial fibrillation.  She is saturating 94% on 5 L via nasal cannula. GENERAL:  The patient is alert and oriented, but in respiratory distress with tachypnea. HEENT:  Facial plethora.  Pupils equal, round, reactive to light. Sclerae icteric.  Oropharynx clear. NECK:  Supple. CARDIOVASCULAR:  Irregularly irregular rate.  No murmurs, gallops, or rubs heard. LUNGS:  With relatively clear to auscultation anteriorly with some crackles at the bases. ABDOMEN:  Protuberant, soft, nondistended, nontender. MUSCULOSKELETAL:  The patient has exquisite left knee pain, any type of flexion or extension about the knee joint produces severe pain in the joint.  The joint is also erythematous and hot to the touch. LOWER EXTREMITIES:  Have symmetric chronic venous stasis changes and less impressive for the initial diagnosis of cellulitis. NEUROLOGIC:  Nonfocal.  LABORATORY DATA:  Plain films of the knee show stable appearance of left total knee arthroplasty with soft tissue edema surrounding the knee. Chest x-ray on the 25th shows cardiomegaly and interstitial edema. Spine films on the 24th showed stable postoperative changes of PLIF at L4-S1, facet joint degenerative changes of the lumbar spine, sclerosis, degenerative change to the sacroiliac joints bilaterally.  Metabolic panel today, sodium 134, potassium 4.9, chloride 96, bicarbonate 28, BUN and creatinine 46 and 2.95, glucose 196.  CBC differential, white count 13.9, hemoglobin 9.3, platelets 180, ANC of 13.3.  Prothrombin time is 22.7 with INR of 1.96.  Blood gas, pH 7.387, pCO2 of 42.5, pO2 of 62.2.  Vancomycin random levels 18.2.  Cell count differential white count is 147,697 with 67% neutrophils.  Intracellular monosodium urate crystals have been seen.  Uric acid level was 12.  Beta- natriuretic peptide is 3938.  Sedimentation rate was 74.  Lactic acid on admission was 1.1.  MICROBIOLOGICAL DATA:   Fluid culture from the right knee on vancomycin, no organisms seen, no growth for 1 day.  Blood cultures from the 24th prior to antibiotics, no growth to date.  Urine culture on the 24th, 40,000 colony units of multiple morphotypes.  Prior cultures of note, left knee wound on July 02, 2006, grew methicillin-resistant Staphylococcus aureus which was resistant to erythromycin, but sensitive to levofloxacin, rifampin, Bactrim, vancomycin, tetracycline.  Prior wound culture, August 2002, of the left knee on vancomycin, no growth for 2 days.  Anaerobic cultures from that specimen were no growth. Tissue culture, September 30, 2000, left knee on Levaquin, no organisms seen, no growth.  IMPRESSION/RECOMMENDATIONS:  This is a 60 year old African American female with past medical history significant for gout, prosthetic knee who presents with fever, knee pain, and with multiple other medical problems. Currently, in renal failure in the setting of diuretics, fever, and vancomycin. 1. 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  She has     shown herself to grow methicillin-resistant Staphylococcus aureus     from prior knee wound.  She was given vancomycin prior to the     aspirate of her knee  being obtained.  The cell count in excess of     140,000 is highly consistent with a septic knee.  Certain highly     consistent with prosthetic knee infection.  Certainly, this could     all be gout as well, but unfortunately given the fact that she has     had antibiotics it will be impossible to discount infection even if cultures are negative. I am skeptical that culture will yield any organism,     but it may in my opinion we are stuck assuming that she has a     prosthetic knee infection.  Given the type of pathogen, she has     shown to be colonized before as she has grown for including     methicillin-resistant Staphylococcus  aureus.  I would maintain her     on active drugs for methicillin-resistant Staphylococcus aureus and     I will change her over to daptomycin.  I think she will need at MINIMUM     formal washout of the knee to ensure that she is receiving     aggressive care for septic knee infection.  Again, possibly she has     gout, but certainly gout and infection do occur in together as     well.  For the time being, I would not add any gram-negative     coverage for antimicrobial selection is complicated by her multiple     allergies which I will have to review with her once again. 2. Infection prevention.  I will place the patient on contact     precautions given the fact we are treating her empirically for     methicillin-resistant Staphylococcus aureus and given the isolation     of this organism from her before.  Thank you for infectious disease consultation.     Acey Lav, MD     CV/MEDQ  D:  06/05/2011  T:  06/06/2011  Job:  [id number]  cc:   Devra Dopp, MSN, ACNP  G. Dorene Grebe, M.D. Fax: [phone]  Electronically Signed by Paulette Blanch DAM MD on 06/06/2011 12:57:13 PM

## 2011-06-07 ENCOUNTER — Inpatient Hospital Stay (HOSPITAL_COMMUNITY): Payer: Medicare HMO

## 2011-06-07 LAB — PROTIME-INR
INR: 2.38 — ABNORMAL HIGH (ref 0.00–1.49)
Prothrombin Time: 26.4 seconds — ABNORMAL HIGH (ref 11.6–15.2)

## 2011-06-07 LAB — GLUCOSE, CAPILLARY
Glucose-Capillary: 159 mg/dL — ABNORMAL HIGH (ref 70–99)
Glucose-Capillary: 205 mg/dL — ABNORMAL HIGH (ref 70–99)
Glucose-Capillary: 214 mg/dL — ABNORMAL HIGH (ref 70–99)
Glucose-Capillary: 184 mg/dL — ABNORMAL HIGH (ref 70–99)

## 2011-06-07 LAB — COMPREHENSIVE METABOLIC PANEL
Albumin: 2.8 g/dL — ABNORMAL LOW (ref 3.5–5.2)
Alkaline Phosphatase: 80 U/L (ref 39–117)
BUN: 74 mg/dL — ABNORMAL HIGH (ref 6–23)
Calcium: 8.7 mg/dL (ref 8.4–10.5)
Creatinine, Ser: 2.39 mg/dL — ABNORMAL HIGH (ref 0.50–1.10)
GFR calc Af Amer: 25 mL/min — ABNORMAL LOW (ref >60)
Glucose, Bld: 197 mg/dL — ABNORMAL HIGH (ref 70–99)
Total Protein: 7.2 g/dL (ref 6.0–8.3)

## 2011-06-07 LAB — PROTEIN ELECTROPHORESIS, SERUM
Albumin ELP: 42.5 % — ABNORMAL LOW (ref 55.8–66.1)
Alpha-1-Globulin: 10.9 % — ABNORMAL HIGH (ref 2.9–4.9)
Beta Globulin: 8 % — ABNORMAL HIGH (ref 4.7–7.2)
Total Protein ELP: 6.1 g/dL (ref 6.0–8.3)

## 2011-06-07 LAB — CBC
HCT: 29.7 % — ABNORMAL LOW (ref 36.0–46.0)
Hemoglobin: 9.2 g/dL — ABNORMAL LOW (ref 12.0–15.0)
MCH: 27.3 pg (ref 26.0–34.0)
MCHC: 31 g/dL (ref 30.0–36.0)
RDW: 18.5 % — ABNORMAL HIGH (ref 11.5–15.5)

## 2011-06-07 LAB — IRON AND TIBC: TIBC: 284 ug/dL (ref 250–470)

## 2011-06-08 LAB — COMPREHENSIVE METABOLIC PANEL
ALT: 27 U/L (ref 0–35)
AST: 26 U/L (ref 0–37)
Albumin: 2.9 g/dL — ABNORMAL LOW (ref 3.5–5.2)
Alkaline Phosphatase: 86 U/L (ref 39–117)
CO2: 34 mEq/L — ABNORMAL HIGH (ref 19–32)
Chloride: 95 mEq/L — ABNORMAL LOW (ref 96–112)
Potassium: 5.1 mEq/L (ref 3.5–5.1)
Total Bilirubin: 0.3 mg/dL (ref 0.3–1.2)

## 2011-06-08 LAB — CBC
HCT: 30.4 % — ABNORMAL LOW (ref 36.0–46.0)
Hemoglobin: 9.1 g/dL — ABNORMAL LOW (ref 12.0–15.0)
MCH: 26.9 pg (ref 26.0–34.0)
MCHC: 29.9 g/dL — ABNORMAL LOW (ref 30.0–36.0)
RDW: 18 % — ABNORMAL HIGH (ref 11.5–15.5)

## 2011-06-08 LAB — GLUCOSE, CAPILLARY
Glucose-Capillary: 193 mg/dL — ABNORMAL HIGH (ref 70–99)
Glucose-Capillary: 137 mg/dL — ABNORMAL HIGH (ref 70–99)

## 2011-06-08 LAB — BODY FLUID CULTURE

## 2011-06-08 LAB — PROTIME-INR: Prothrombin Time: 35.5 seconds — ABNORMAL HIGH (ref 11.6–15.2)

## 2011-06-09 LAB — PROTIME-INR: Prothrombin Time: 46.5 seconds — ABNORMAL HIGH (ref 11.6–15.2)

## 2011-06-09 LAB — BASIC METABOLIC PANEL
BUN: 87 mg/dL — ABNORMAL HIGH (ref 6–23)
Calcium: 9.1 mg/dL (ref 8.4–10.5)
Creatinine, Ser: 2.04 mg/dL — ABNORMAL HIGH (ref 0.50–1.10)
GFR calc non Af Amer: 25 mL/min — ABNORMAL LOW (ref >60)
Glucose, Bld: 163 mg/dL — ABNORMAL HIGH (ref 70–99)
Potassium: 4.7 mEq/L (ref 3.5–5.1)

## 2011-06-09 LAB — GLUCOSE, CAPILLARY
Glucose-Capillary: 190 mg/dL — ABNORMAL HIGH (ref 70–99)
Glucose-Capillary: 192 mg/dL — ABNORMAL HIGH (ref 70–99)

## 2011-06-09 LAB — CULTURE, BLOOD (ROUTINE X 2)
Culture  Setup Time: 201206250852
Culture  Setup Time: 201206250852
Culture: NO GROWTH

## 2011-06-09 LAB — POCT OCCULT BLOOD STOOL (DEVICE): Fecal Occult Bld: POSITIVE

## 2011-06-10 DIAGNOSIS — Z96659 Presence of unspecified artificial knee joint: Secondary | ICD-10-CM

## 2011-06-10 DIAGNOSIS — Y849 Medical procedure, unspecified as the cause of abnormal reaction of the patient, or of later complication, without mention of misadventure at the time of the procedure: Secondary | ICD-10-CM

## 2011-06-10 DIAGNOSIS — T8450XA Infection and inflammatory reaction due to unspecified internal joint prosthesis, initial encounter: Secondary | ICD-10-CM

## 2011-06-10 LAB — UIFE/LIGHT CHAINS/TP QN, 24-HR UR
Albumin, U: DETECTED
Alpha 1, Urine: DETECTED — AB
Free Kappa/Lambda Ratio: 10.79 ratio — ABNORMAL HIGH (ref 2.04–10.37)
Free Lambda Excretion/Day: 19.16 mg/d
Total Protein, Urine-Ur/day: 339 mg/d — ABNORMAL HIGH (ref 10–140)
Total Protein, Urine: 12.9 mg/dL
Volume, Urine: 2625 mL

## 2011-06-10 LAB — PROTIME-INR: INR: 3.64 — ABNORMAL HIGH (ref 0.00–1.49)

## 2011-06-10 LAB — GLUCOSE, CAPILLARY
Glucose-Capillary: 177 mg/dL — ABNORMAL HIGH (ref 70–99)
Glucose-Capillary: 176 mg/dL — ABNORMAL HIGH (ref 70–99)

## 2011-06-10 LAB — BASIC METABOLIC PANEL
CO2: 30 mEq/L (ref 19–32)
Calcium: 9.7 mg/dL (ref 8.4–10.5)
Glucose, Bld: 204 mg/dL — ABNORMAL HIGH (ref 70–99)
Potassium: 4.7 mEq/L (ref 3.5–5.1)
Sodium: 136 mEq/L (ref 135–145)

## 2011-06-10 LAB — CBC
HCT: 37.3 % (ref 36.0–46.0)
Hemoglobin: 11.4 g/dL — ABNORMAL LOW (ref 12.0–15.0)
WBC: 3.6 10*3/uL — ABNORMAL LOW (ref 4.0–10.5)

## 2011-06-11 LAB — COMPREHENSIVE METABOLIC PANEL
ALT: 29 U/L (ref 0–35)
AST: 21 U/L (ref 0–37)
Albumin: 3 g/dL — ABNORMAL LOW (ref 3.5–5.2)
Alkaline Phosphatase: 98 U/L (ref 39–117)
BUN: 76 mg/dL — ABNORMAL HIGH (ref 6–23)
Chloride: 96 mEq/L (ref 96–112)
Potassium: 4.6 mEq/L (ref 3.5–5.1)
Sodium: 137 mEq/L (ref 135–145)
Total Bilirubin: 0.4 mg/dL (ref 0.3–1.2)
Total Protein: 7.6 g/dL (ref 6.0–8.3)

## 2011-06-11 LAB — CBC
Hemoglobin: 11.2 g/dL — ABNORMAL LOW (ref 12.0–15.0)
MCH: 27.6 pg (ref 26.0–34.0)
MCV: 88.7 fL (ref 78.0–100.0)
RBC: 4.06 MIL/uL (ref 3.87–5.11)

## 2011-06-11 LAB — GLUCOSE, CAPILLARY: Glucose-Capillary: 176 mg/dL — ABNORMAL HIGH (ref 70–99)

## 2011-06-12 LAB — GLUCOSE, CAPILLARY: Glucose-Capillary: 223 mg/dL — ABNORMAL HIGH (ref 70–99)

## 2011-06-12 LAB — COMPREHENSIVE METABOLIC PANEL
Albumin: 2.9 g/dL — ABNORMAL LOW (ref 3.5–5.2)
BUN: 79 mg/dL — ABNORMAL HIGH (ref 6–23)
Calcium: 9.1 mg/dL (ref 8.4–10.5)
Chloride: 97 mEq/L (ref 96–112)
Creatinine, Ser: 2.01 mg/dL — ABNORMAL HIGH (ref 0.50–1.10)
GFR calc non Af Amer: 25 mL/min — ABNORMAL LOW (ref >60)
Total Bilirubin: 0.5 mg/dL (ref 0.3–1.2)

## 2011-06-12 LAB — CK TOTAL AND CKMB (NOT AT ARMC): CK, MB: 1.6 ng/mL (ref 0.3–4.0)

## 2011-06-12 LAB — PROTIME-INR
INR: 2.23 — ABNORMAL HIGH (ref 0.00–1.49)
Prothrombin Time: 25.1 seconds — ABNORMAL HIGH (ref 11.6–15.2)

## 2011-06-13 DIAGNOSIS — Z96659 Presence of unspecified artificial knee joint: Secondary | ICD-10-CM

## 2011-06-13 DIAGNOSIS — Y849 Medical procedure, unspecified as the cause of abnormal reaction of the patient, or of later complication, without mention of misadventure at the time of the procedure: Secondary | ICD-10-CM

## 2011-06-13 DIAGNOSIS — T8450XA Infection and inflammatory reaction due to unspecified internal joint prosthesis, initial encounter: Secondary | ICD-10-CM

## 2011-06-13 LAB — GLUCOSE, CAPILLARY
Glucose-Capillary: 265 mg/dL — ABNORMAL HIGH (ref 70–99)
Glucose-Capillary: 208 mg/dL — ABNORMAL HIGH (ref 70–99)

## 2011-06-13 LAB — CREATININE, SERUM
GFR calc Af Amer: 31 mL/min — ABNORMAL LOW (ref >60)
GFR calc non Af Amer: 25 mL/min — ABNORMAL LOW (ref >60)

## 2011-06-13 LAB — PROTIME-INR: Prothrombin Time: 24.7 seconds — ABNORMAL HIGH (ref 11.6–15.2)

## 2011-06-14 DIAGNOSIS — T8450XA Infection and inflammatory reaction due to unspecified internal joint prosthesis, initial encounter: Secondary | ICD-10-CM

## 2011-06-14 DIAGNOSIS — Y838 Other surgical procedures as the cause of abnormal reaction of the patient, or of later complication, without mention of misadventure at the time of the procedure: Secondary | ICD-10-CM

## 2011-06-14 DIAGNOSIS — Z96659 Presence of unspecified artificial knee joint: Secondary | ICD-10-CM

## 2011-06-14 LAB — COMPREHENSIVE METABOLIC PANEL
ALT: 54 U/L — ABNORMAL HIGH (ref 0–35)
AST: 36 U/L (ref 0–37)
Albumin: 3 g/dL — ABNORMAL LOW (ref 3.5–5.2)
Calcium: 9 mg/dL (ref 8.4–10.5)
GFR calc Af Amer: 31 mL/min — ABNORMAL LOW (ref >60)
Glucose, Bld: 174 mg/dL — ABNORMAL HIGH (ref 70–99)
Sodium: 139 mEq/L (ref 135–145)
Total Protein: 7.3 g/dL (ref 6.0–8.3)

## 2011-06-14 LAB — PROTIME-INR: Prothrombin Time: 27 seconds — ABNORMAL HIGH (ref 11.6–15.2)

## 2011-06-14 LAB — GLUCOSE, CAPILLARY
Glucose-Capillary: 156 mg/dL — ABNORMAL HIGH (ref 70–99)
Glucose-Capillary: 182 mg/dL — ABNORMAL HIGH (ref 70–99)
Glucose-Capillary: 121 mg/dL — ABNORMAL HIGH (ref 70–99)

## 2011-06-14 LAB — CBC
MCH: 27.6 pg (ref 26.0–34.0)
MCHC: 30.9 g/dL (ref 30.0–36.0)
Platelets: 217 10*3/uL (ref 150–400)
RDW: 17.9 % — ABNORMAL HIGH (ref 11.5–15.5)

## 2011-06-15 LAB — GLUCOSE, CAPILLARY
Glucose-Capillary: 176 mg/dL — ABNORMAL HIGH (ref 70–99)
Glucose-Capillary: 151 mg/dL — ABNORMAL HIGH (ref 70–99)
Glucose-Capillary: 150 mg/dL — ABNORMAL HIGH (ref 70–99)

## 2011-06-15 LAB — COMPREHENSIVE METABOLIC PANEL
ALT: 49 U/L — ABNORMAL HIGH (ref 0–35)
AST: 33 U/L (ref 0–37)
Alkaline Phosphatase: 111 U/L (ref 39–117)
CO2: 27 mEq/L (ref 19–32)
Chloride: 101 mEq/L (ref 96–112)
Creatinine, Ser: 1.97 mg/dL — ABNORMAL HIGH (ref 0.50–1.10)
GFR calc non Af Amer: 26 mL/min — ABNORMAL LOW (ref >60)
Potassium: 4.2 mEq/L (ref 3.5–5.1)
Total Bilirubin: 0.3 mg/dL (ref 0.3–1.2)

## 2011-06-15 LAB — PROTIME-INR: INR: 2.53 — ABNORMAL HIGH (ref 0.00–1.49)

## 2011-06-16 ENCOUNTER — Inpatient Hospital Stay (HOSPITAL_COMMUNITY): Payer: Medicare HMO

## 2011-06-16 LAB — GLUCOSE, CAPILLARY
Glucose-Capillary: 217 mg/dL — ABNORMAL HIGH (ref 70–99)
Glucose-Capillary: 151 mg/dL — ABNORMAL HIGH (ref 70–99)

## 2011-06-17 LAB — CREATININE, SERUM
Creatinine, Ser: 1.63 mg/dL — ABNORMAL HIGH (ref 0.50–1.10)
GFR calc Af Amer: 39 mL/min — ABNORMAL LOW (ref >60)
GFR calc non Af Amer: 32 mL/min — ABNORMAL LOW (ref >60)

## 2011-06-17 LAB — GLUCOSE, CAPILLARY: Glucose-Capillary: 132 mg/dL — ABNORMAL HIGH (ref 70–99)

## 2011-06-18 LAB — CBC
HCT: 31.9 % — ABNORMAL LOW (ref 36.0–46.0)
MCV: 91.4 fL (ref 78.0–100.0)
Platelets: 329 10*3/uL (ref 150–400)
RBC: 3.49 MIL/uL — ABNORMAL LOW (ref 3.87–5.11)
RDW: 18.2 % — ABNORMAL HIGH (ref 11.5–15.5)
WBC: 6.7 10*3/uL (ref 4.0–10.5)

## 2011-06-18 LAB — COMPREHENSIVE METABOLIC PANEL
ALT: 46 U/L — ABNORMAL HIGH (ref 0–35)
Albumin: 2.9 g/dL — ABNORMAL LOW (ref 3.5–5.2)
Alkaline Phosphatase: 117 U/L (ref 39–117)
BUN: 45 mg/dL — ABNORMAL HIGH (ref 6–23)
Chloride: 100 mEq/L (ref 96–112)
Glucose, Bld: 172 mg/dL — ABNORMAL HIGH (ref 70–99)
Potassium: 3.9 mEq/L (ref 3.5–5.1)
Sodium: 139 mEq/L (ref 135–145)
Total Bilirubin: 0.5 mg/dL (ref 0.3–1.2)

## 2011-06-18 LAB — GLUCOSE, CAPILLARY
Glucose-Capillary: 178 mg/dL — ABNORMAL HIGH (ref 70–99)
Glucose-Capillary: 164 mg/dL — ABNORMAL HIGH (ref 70–99)

## 2011-06-18 LAB — PROTIME-INR: INR: 2.84 — ABNORMAL HIGH (ref 0.00–1.49)

## 2011-06-18 LAB — PRO B NATRIURETIC PEPTIDE: Pro B Natriuretic peptide (BNP): 1583 pg/mL — ABNORMAL HIGH (ref 0–125)

## 2011-06-19 LAB — GLUCOSE, CAPILLARY: Glucose-Capillary: 203 mg/dL — ABNORMAL HIGH (ref 70–99)

## 2011-06-19 LAB — PROTIME-INR
INR: 2.73 — ABNORMAL HIGH (ref 0.00–1.49)
Prothrombin Time: 29.4 seconds — ABNORMAL HIGH (ref 11.6–15.2)

## 2011-06-20 DIAGNOSIS — T8450XA Infection and inflammatory reaction due to unspecified internal joint prosthesis, initial encounter: Secondary | ICD-10-CM

## 2011-06-20 DIAGNOSIS — Z96659 Presence of unspecified artificial knee joint: Secondary | ICD-10-CM

## 2011-06-20 LAB — COMPREHENSIVE METABOLIC PANEL
ALT: 44 U/L — ABNORMAL HIGH (ref 0–35)
Alkaline Phosphatase: 114 U/L (ref 39–117)
BUN: 40 mg/dL — ABNORMAL HIGH (ref 6–23)
CO2: 31 mEq/L (ref 19–32)
Chloride: 100 mEq/L (ref 96–112)
GFR calc Af Amer: 34 mL/min — ABNORMAL LOW (ref >60)
Glucose, Bld: 178 mg/dL — ABNORMAL HIGH (ref 70–99)
Potassium: 3.8 mEq/L (ref 3.5–5.1)
Sodium: 140 mEq/L (ref 135–145)
Total Bilirubin: 0.4 mg/dL (ref 0.3–1.2)

## 2011-06-20 LAB — CBC
HCT: 32.3 % — ABNORMAL LOW (ref 36.0–46.0)
Hemoglobin: 9.7 g/dL — ABNORMAL LOW (ref 12.0–15.0)
MCV: 91.5 fL (ref 78.0–100.0)
RBC: 3.53 MIL/uL — ABNORMAL LOW (ref 3.87–5.11)
WBC: 5.4 10*3/uL (ref 4.0–10.5)

## 2011-06-20 LAB — GLUCOSE, CAPILLARY
Glucose-Capillary: 147 mg/dL — ABNORMAL HIGH (ref 70–99)
Glucose-Capillary: 191 mg/dL — ABNORMAL HIGH (ref 70–99)

## 2011-06-20 LAB — PROTIME-INR: Prothrombin Time: 30.8 seconds — ABNORMAL HIGH (ref 11.6–15.2)

## 2011-06-21 LAB — PROTIME-INR
INR: 3.01 — ABNORMAL HIGH (ref 0.00–1.49)
Prothrombin Time: 31.7 seconds — ABNORMAL HIGH (ref 11.6–15.2)

## 2011-06-21 LAB — GLUCOSE, CAPILLARY
Glucose-Capillary: 151 mg/dL — ABNORMAL HIGH (ref 70–99)
Glucose-Capillary: 134 mg/dL — ABNORMAL HIGH (ref 70–99)

## 2011-06-22 LAB — COMPREHENSIVE METABOLIC PANEL
ALT: 36 U/L — ABNORMAL HIGH (ref 0–35)
Alkaline Phosphatase: 113 U/L (ref 39–117)
CO2: 30 mEq/L (ref 19–32)
Chloride: 99 mEq/L (ref 96–112)
GFR calc Af Amer: 27 mL/min — ABNORMAL LOW (ref >60)
GFR calc non Af Amer: 22 mL/min — ABNORMAL LOW (ref >60)
Glucose, Bld: 160 mg/dL — ABNORMAL HIGH (ref 70–99)
Potassium: 3.6 mEq/L (ref 3.5–5.1)
Sodium: 138 mEq/L (ref 135–145)

## 2011-06-22 LAB — PROTIME-INR: Prothrombin Time: 30.7 seconds — ABNORMAL HIGH (ref 11.6–15.2)

## 2011-06-22 LAB — GLUCOSE, CAPILLARY

## 2011-06-23 LAB — CBC
Hemoglobin: 9.7 g/dL — ABNORMAL LOW (ref 12.0–15.0)
MCH: 27.5 pg (ref 26.0–34.0)
MCV: 90.7 fL (ref 78.0–100.0)
RBC: 3.53 MIL/uL — ABNORMAL LOW (ref 3.87–5.11)
WBC: 5.3 10*3/uL (ref 4.0–10.5)

## 2011-06-23 LAB — PROTIME-INR: Prothrombin Time: 26.7 seconds — ABNORMAL HIGH (ref 11.6–15.2)

## 2011-06-23 LAB — BASIC METABOLIC PANEL
BUN: 40 mg/dL — ABNORMAL HIGH (ref 6–23)
Chloride: 99 mEq/L (ref 96–112)
GFR calc Af Amer: 32 mL/min — ABNORMAL LOW (ref >60)
Potassium: 3.6 mEq/L (ref 3.5–5.1)
Sodium: 137 mEq/L (ref 135–145)

## 2011-06-23 LAB — GLUCOSE, CAPILLARY
Glucose-Capillary: 163 mg/dL — ABNORMAL HIGH (ref 70–99)
Glucose-Capillary: 155 mg/dL — ABNORMAL HIGH (ref 70–99)
Glucose-Capillary: 133 mg/dL — ABNORMAL HIGH (ref 70–99)

## 2011-06-24 LAB — PROTIME-INR
INR: 2.19 — ABNORMAL HIGH (ref 0.00–1.49)
Prothrombin Time: 24.7 seconds — ABNORMAL HIGH (ref 11.6–15.2)

## 2011-06-24 LAB — GLUCOSE, CAPILLARY
Glucose-Capillary: 180 mg/dL — ABNORMAL HIGH (ref 70–99)
Glucose-Capillary: 154 mg/dL — ABNORMAL HIGH (ref 70–99)

## 2011-06-24 NOTE — Discharge Summary (Signed)
NAMEHONESTI, [name]             ACCOUNT NO.:  [id number]  MEDICAL RECORD NO.:  [id number]  LOCATION:  1443                         FACILITY:  [facility]  PHYSICIAN:  Galilea Quito L. [name], M.D.     DATE OF BIRTH:  06/25/1951  DATE OF ADMISSION:  06/02/2011 DATE OF DISCHARGE:  06/24/2011                              DISCHARGE SUMMARY   FINAL DIAGNOSES: 1. Congestive heart failure, compensated. 2. Atrial fibrillation flutter with stable rate control. 3. Osteomyelitis with cellulitis of left leg, improving. 4. Diabetes mellitus. 5. Hypertension, improved. 6. Morbid obesity and deconditioning. 7. Obstructive sleep apnea with obstructive hypoventilation syndrome. 8. Acute on chronic renal insufficiency. 9. 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 10.Depression. 11.Pulmonary hypertension. 12.History of asthma, stabilized.  OPERATIONS AND PROCEDURES: 1. PICC line placement. 2. TEE cardioversion.  HISTORY OF PRESENT ILLNESS:  Patient is a 60 year old morbidly obese female with history of hypertension, congestive heart failure, sleep apnea, diabetes mellitus, who presented to Emergency Room on June 02, 2011 complaining of increasing lower extremity pain and edema of two days' duration.  Patient notes that the pain started one day prior to admission with aching pain in the legs and buttocks.  She was noted that when she got up to go to church, she had more pain in the left leg and eventually the pain progressed to the point where she could not bear weight on that left leg.  The patient reports that she did not take any medication on the day of admission.  Her husband noted that the patient has not been following her physician recommendation and has been eating salty snacks for the last 2 days in large amounts.  Patient reports that she has had no fever, chills as well.  When seen in the Emergency Room, she was noted have a fever.  Fever at that time was  documented to be 102.  She had extremely elevated white count.  She notably had edema and redness in the left lower extremity.  X-ray did not detect any evidence of fractures.  Patient has a distant history of gout, but has not had any recent attacks.  She was admitted by the hospitalist service for evaluation and treatment of her cellulitis and lower extremities.  She was noted also at the time to have atrial flutter with heart rate of approximately 120.  There was evidence for congestive heart failure as well.  PAST MEDICAL HISTORY:  As per admission H and P.  PHYSICAL EXAMINATION:  As per admission H and P.  HOSPITAL COURSE:  Patient was admitted to the hospital for further treatment of multiple medical problems.  She presented at the time complaining of increasing left leg pain and swelling.  Clinical picture was most consistent with a cellulitis of the lower extremity as well.  X- rays at the time of presentation of her chest demonstrated evidence for congestive heart failure with cardiomegaly and vascular congestion.  Her BNP was 1301 at the time.  Patient was admitted to the step-down unit for further treatment.  She was placed on intravenous diltiazem for rate control.  She was also started on antibiotics.  She was placed on IV furosemide for  diuresis as well.  Patient was placed on CPAP as she had a history of sleep apnea, which she had not been compliant with her apparatus.  She was seen in consultation by cardiologist, Dr. Sharyn Lull, who have been following patient as well.  Patient was seen in consultation by Infectious Disease.  It was felt that her picture of her knee was most consistent with a cellulitis with probable osteomyelitis.  She was seen in consultation by Dr. Lajoyce Corners of Orthopedics as well.  It was unclear initially her knee inflammatory process was consistent with septic arthritis versus gout.  This area was tapped.  She was maintained on vancomycin as well.  It  is of note that the aspirate did show some signs of gout crystals as well.  Further therapy was continued for both.  Patient was followed during this time by Infectious Disease.  Notably changes were associated with osteomyelitis in addition to the gout as noted.  It was recommended for the patient to proceed with extensive prolonged IV vancomycin.  During the hospital stay, she did have impairment of renal functions. She was evaluated by the renal team as well.  Her regimen was maximized to avoid further insult to her kidneys during her vancomycin therapy. Ir was felt that she by record had chronic kidney disease with recent exacerbation.  It is felt that most likely her underlying problem secondary to hypertension and diabetes as well.  Her creatinine reached a maximum of 2.95.  Over the subsequent weeks, however, this gradually defervesced.  Further evaluation for the cause of patient's cellulitis was pursued as well.  She did undergo a TE echocardiogram.  There was no evidence for vegetation being found.  Over the subsequent days, patient was maintained on IV antibiotics. Attempts at conversion was performed with TEE cardioversion.  This did not maintain patient's rhythm.  She was maintained on amiodarone and digoxin for rate control.  She was seen by Dr. Graciela Husbands of Cardiology as well for further evaluation of her arrhythmia.  It is felt that based on her multiple atrial arrhythmias that she would not be a candidate for catheter ablative therapy.  It was recommend that she be maintained on warfarin and amiodarone ongoing.  With continued therapy, patient's congestive failure did gradually compensate.  She was moved to the floor for further therapy.  Patient had now progressed to the point that further rehabilitative measures would be pursued.  After much discussion, it was decided that patient would not be a safe candidate to be managed at home.  She would still need IV  antibiotics in the form of vancomycin.  She had significant obesity with respiratory issues and poor home support. Plans were subsequently for patient to be transferred to rehab for further therapy.  Both patient and family is agreeable to this at this time.  Presently, patient's renal functions are stabilized with a creatinine of 1.98, BUN of 40.  INR at 2.42.  Hemoglobin 9.7.  She has a controlled rhythm on telemetry with atrial flutter with a rate of approximately 78. It is felt that she has reached maximum hospital benefit at this time and would need further rehabilitative therapy at a skilled nursing facility.  Arrangements have been made for patient to be transferred to [facility] for further therapy.  MEDICATIONS:  Medications at time of transfers consist of: 1. Amiodarone 200 mg daily. 2. Aspirin 81 mg daily. 3. Coumadin per pharmacy protocol. 4. Crestor 10 mg q.h.s. 5. Fergon 324 mg p.o. b.i.d.  6. Folic acid 1 mg daily. 7. Digoxin 0.125 mg daily. 8. Lasix 80 mg p.o. b.i.d. 9. Lopressor 50 mg p.o. b.i.d. 10.Methotrexate 10 mg every 7 days. 11.She will be maintained on a sliding scale NovoLog insulin     sensitive. 12.She will continue on Protonix 40 mg p.o. daily. 13.She will be maintained on vancomycin 750 mg IV q.24h.  This will be     monitored and managed by pharmacy protocol as well. 14.She will be continued on vitamin B 650 mg daily. 15.Xopenex 0.63 mg per nebulizer unit t.i.d. 16.Ambien 5 mg p.o. q.h.s. p.r.n. sleep. 17.Phenergan 25 mg p.o. q.4-6h. p.r.n. nausea. 18.Senokot 2 tablets p.o. daily p.r.n. constipation.  DIET:  Patient will be maintained on a 2 grams sodium, 1600 calories carbohydrate modified diet.  She will continue to have a PICC line in place.  Today is day 14 of vancomycin therapy.  She has a total of 22 days scheduled.          ______________________________ Lind Guest [name], M.D.     ELD/MEDQ  D:  06/24/2011  T:  06/24/2011   Job:  [id number]  Electronically Signed by Willey Blade M.D. on 06/24/2011 11:04:53 PM

## 2011-06-27 NOTE — Consult Note (Signed)
NAME:  NATSUMI, WHITSITT NO.:  [id number]  MEDICAL RECORD NO.:  [id number]  LOCATION:  1443                         FACILITY:  [facility]  PHYSICIAN:  Duke Salvia, MD, FACCDATE OF BIRTH:  01-01-1951  DATE OF CONSULTATION:  06/11/2011 DATE OF DISCHARGE:                                CONSULTATION   Thank you very much for asking [place] to see Saul Fordyce in consultation because of atrial arrhythmias.  She is a 60 year old woman who was admitted about a week ago because of left leg pain.  She was found to have inflammatory arthritis which ultimately has been diagnosed to be presumed septic arthritis in a previously operated upon joint associated with osteomyelitis.  While in hospital, she has had recurrent atrial arrhythmias.  She was found initially to have atrial flutter which is typical.  She underwent TEE cardioversion and has had atrial fibrillation and atypical flutter since that time.  She has had variably significant symptoms with her atrial arrhythmias. She is aware of them as an uneasiness and a shaking in her chest.  They are frequently accompanied by shortness of breath when they are "bad." They have been going on for about a year.  She is currently on amiodarone.  Thromboembolic risk factors notable for gender, diabetes, prior stroke and hypertension for CHADS-VADs score of 5 and CHADS score of 4. Previously she was on warfarin.  She also has normal left ventricular function.  She underwent Myoview scanning in 2010 demonstrating no infarcts and no ischemia.  Ejection fraction 60%.  Catheterization in 2005 demonstrated mild nonobstructive disease.  She has also had problems however with congestive heart.  She is markedly limited at home in terms of her ambulation because of her obesity, arthritis but also by shortness of breath.  Her hospital course has been further complicated by acute on chronic renal failure which is now gradually  improved.  PAST MEDICAL HISTORY: 1. Her past medical history in addition to above is notable for morbid     obesity. 2. Chronic debility. 3. Sleep apnea, on CPAP. 4. Obesity hypoventilation syndrome. 5. COPD. 6. Nephrolithiasis. 7. Gout.  PAST SURGICAL HISTORY:  Notable for knee replacement in 2001 and infection that required hardware removal the following year.  PAST SURGICAL HISTORY:  She has also had back surgery, hysterectomy and a left arm surgery.  SOCIAL HISTORY:  She is married.  She has 2 children.  She has multiple grandchildren and a couple of great grandchildren.  She does not use cigarettes, alcohol or recreational drugs currently.  She is disabled.  ALLERGIES:  Her allergy list is very long and it includes multiple antibiotics including CEPHALOSPORINS, SULFA, OFLOXACIN, PENICILLIN and ERYTHROMYCIN.  She is also allergic to CODEINE, ATENOLOL and SULFA.  FAMILY HISTORY:  Her family history is notable for diabetes and coronary artery disease.  REVIEW OF SYSTEMS:  Notable as above, is otherwise negative across multiple organ systems apart from poor vision.  CURRENT MEDICATIONS:  Include warfarin, aspirin, Protonix daily. Further they include amiodarone, Desonide, Temovate, iron, Lasix, insulin, metoprolol 25 b.i.d. and vancomycin.  PHYSICAL EXAMINATION:  GENERAL:  On examination, she is a morbidly obese older African-American female lying flat in  bed, appearing older than her stated age of 80.  She is in no acute distress.  She is wearing glasses. HEENT EXAM:  Unremarkable. NECK:  Her neck was supple.  Neck veins were flat.  Carotids are brisk and full bilaterally without bruits. BACK:  Without kyphosis. LUNGS:  Clear. HEART:  Heart sounds were irregular but not rapid.  There is an early systolic murmur. ABDOMEN:  The abdomen was protuberant but soft. EXTREMITIES:  The extremities had trace edema on the left.  The right side was not examined. NEUROLOGICAL:   Exam was grossly normal. SKIN:  Warm and dry.  Her affect was engaging.  Electrocardiograms dated today demonstrated atrial flutter - atypical with a cycle length of 240 msec and variable conduction.  Other intervals were - 0.08, 0.42 with a QTc of 0.49, the axis was 27 degrees.  Electrocardiogram dated June 09, 2011, demonstrated sinus rhythm at 58 with intervals of 0.15/0.08/0.43, the axis was 35.  Electrocardiogram June 09, 2011, at 1500 hours demonstrated atrial fibrillation at 125 with intervals - 0.08/0.31with axis 55.  Electrocardiogram June 06, 2011, demonstrated sinus rhythm.  Electrocardiogram on June 03, 2011, demonstrated typical atrial flutter.  IMPRESSION: 1. Multiple atrial arrhythmias including:     a.     Typical atrial flutter.     b.     Atypical atrial flutter     c.     Atrial fibrillation status post cardioversion with      spontaneous and induced reversion to sinus rhythm. 2. Thromboembolic risk factors notable for gender, diabetes, prior     stroke and hypertension for CHADS-VASc score of 5 and previous on     and now again on warfarin. 3. Coronary artery disease - nonobstructive, right cath in 2005 with     negative Myoview in 2010.  Ejection fraction of 61%. 4. Tendency towards bradycardia. 5. Congestive heart failure - acute on chronic - diastolic 6. 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 Acute on chronic kidney disease, now grade 4.  DISCUSSION:  Ms. Placzek has multiple atrial arrhythmias and as such is not a candidate for catheter ablative therapy.  I think that warfarin and amiodarone are her best long-term options.  No other antiarrhythmic drug would be acceptable given her heart failure and her renal insufficiency.  I worry a little bit about the amiodarone, however, and that might aggravate her bradycardia, so we will have to keep an eye on this.  I would continue her on Coumadin as you are  doing and then anticipate cardioversion in couple of weeks.  Please call if you have any further questions.     Duke Salvia, MD, [facility]     SCK/MEDQ  D:  06/11/2011  T:  06/12/2011  Job:  [id number]  Electronically Signed by Sherryl Manges MD [facility] on 06/27/2011 02:14:05 PM

## 2011-07-24 ENCOUNTER — Telehealth: Payer: Self-pay | Admitting: Licensed Clinical Social Worker

## 2011-07-24 ENCOUNTER — Encounter: Payer: Self-pay | Admitting: Infectious Disease

## 2011-07-24 ENCOUNTER — Ambulatory Visit (INDEPENDENT_AMBULATORY_CARE_PROVIDER_SITE_OTHER): Payer: Medicare HMO | Admitting: Infectious Disease

## 2011-07-24 DIAGNOSIS — A491 Streptococcal infection, unspecified site: Secondary | ICD-10-CM

## 2011-07-24 DIAGNOSIS — T8450XA Infection and inflammatory reaction due to unspecified internal joint prosthesis, initial encounter: Secondary | ICD-10-CM | POA: Insufficient documentation

## 2011-07-24 DIAGNOSIS — B951 Streptococcus, group B, as the cause of diseases classified elsewhere: Secondary | ICD-10-CM

## 2011-07-24 DIAGNOSIS — M109 Gout, unspecified: Secondary | ICD-10-CM

## 2011-07-24 LAB — CBC WITH DIFFERENTIAL/PLATELET
Basophils Absolute: 0 10*3/uL (ref 0.0–0.1)
Basophils Relative: 0 % (ref 0–1)
HCT: 32.3 % — ABNORMAL LOW (ref 36.0–46.0)
Lymphocytes Relative: 7 % — ABNORMAL LOW (ref 12–46)
MCHC: 31 g/dL (ref 30.0–36.0)
Neutro Abs: 6.3 10*3/uL (ref 1.7–7.7)
Neutrophils Relative %: 84 % — ABNORMAL HIGH (ref 43–77)
Platelets: 277 10*3/uL (ref 150–400)
RDW: 19.2 % — ABNORMAL HIGH (ref 11.5–15.5)
WBC: 7.5 10*3/uL (ref 4.0–10.5)

## 2011-07-24 LAB — COMPLETE METABOLIC PANEL WITH GFR
ALT: 18 U/L (ref 0–35)
AST: 27 U/L (ref 0–37)
Albumin: 3.2 g/dL — ABNORMAL LOW (ref 3.5–5.2)
Alkaline Phosphatase: 79 U/L (ref 39–117)
Calcium: 8.5 mg/dL (ref 8.4–10.5)
Chloride: 109 mEq/L (ref 96–112)
Potassium: 4.1 mEq/L (ref 3.5–5.3)
Sodium: 143 mEq/L (ref 135–145)

## 2011-07-24 MED ORDER — VITAMIN B-6 50 MG PO TABS: 50.0000 mg | ORAL_TABLET | Freq: Every day | ORAL | Status: DC

## 2011-07-24 MED ORDER — LINEZOLID 600 MG PO TABS: 600.0000 mg | ORAL_TABLET | Freq: Two times a day (BID) | ORAL | Status: AC

## 2011-07-24 NOTE — Patient Instructions (Signed)
Stop the doxycyline I have writtne a prescription for zyvox and sent to your pharmacy We have labs drawn today  I am concerned that you did not have washout of the joint several weeks ago. I think you need washout, removal of the the prosthetic components and retreatment with IV antibiotics  Have Dr. Jerl Santos call me later today  We will work on getting grecords from Dr. [name] as well

## 2011-07-24 NOTE — Assessment & Plan Note (Signed)
See above discussion

## 2011-07-24 NOTE — Assessment & Plan Note (Signed)
WHile she certainly has gout, her cell coutn was high and she grew GROUP B strep despite 24 hours of antibiotics

## 2011-07-24 NOTE — Progress Notes (Signed)
Subjective:    Patient ID: [name], female    DOB: 06/08/1951, 60 y.o.   MRN: [id number]  HPI [name] is a 60 year old African  American female with a past medical history significant for multiple   medical problems including atrial fibrillation with rapid ventricular   response, congestive heart failure, obesity hypoventilation syndrome,   diabetes mellitus, tobacco use, and left-sided knee replacement WITH HISTORY OF PROSTHETIC KNEE INFECTION IN 2001, SP TEQUIN  AND RIFAMPIN WITH TWO STAGED JOINT REPLACEMENT, AND HISTORY OF MRSA FROM KNEE WOUND IN 2007.     She  was admitted on the 24th of June with 2 days of worsening left  lower extremity pain.  She was unable to support much weight on that  side.  Initially when she was seen in the emergency apartment,  she was found to be febrile to 102 with elevated white blood cell count.  She   was thought to have redness in her left lower extremity with concern for  possible cellulitis.  She was known to have gout as well.  She was   feeling ill and was in atrial flutter with a rapid response in the 120s. She was brought in to the critical care for further management.  On   admission, she was started empirically on vancomycin for "cellulitis".  In the interim, it was found that her knee actually was exquisitely tender to palpation and she underwent aspiration of the knee roughly 24 hours after she had been started on vancomycin.  Cell count and differential   from the knee revealed 147,697 white blood cells with 67% neutrophils.  Intracellular monosodium urate crystals were seen and the patient's uric   acid level was elevated at 12, consistent with her prior history of gout.  Sedimentation rate was also elevated at 74.  Cultures ultimately yielded Group B streptococci R to clindamycin. He was in renal failure and I switched her to Daptomycin but had high CPK after initiation and was switched back to vancomycin after it was found that the  group b strep was Clinda R. She NEVER had washout of joint or exchange arthroplasty wish I had pushed for. She finished the IV vancomycin and then stopped two weeks ago. She saw her PCP Dr. [name] who started doxycyline. Her knee pain has worsened and she feels like "ton of bricks" on it. Her PCP placed her on doxycycline but I am skeptical of its reliability against this organism. She is scheduled to see Dr. Jerl Santos this afternoon. I have written script for zyvox in interim and am calling Willey Blade to investigate her fluoroquinolone allergy. In all I spent greater than 45 minutes with this pt includign greater than 50% of time counselling the pt face to face and in coordination of her care.  Willey Blade PCP Dr. Jerl Santos Ortho    Review of Systems  Constitutional: Negative for fever, chills, diaphoresis, activity change, appetite change, fatigue and unexpected weight change.  HENT: Negative for congestion, sore throat, rhinorrhea, sneezing, trouble swallowing and sinus pressure.   Eyes: Negative for photophobia and visual disturbance.  Respiratory: Negative for cough, chest tightness, shortness of breath, wheezing and stridor.   Cardiovascular: Negative for chest pain, palpitations and leg swelling.  Gastrointestinal: Negative for nausea, vomiting, abdominal pain, diarrhea, constipation, blood in stool, abdominal distention and anal bleeding.  Genitourinary: Negative for dysuria, hematuria, flank pain and difficulty urinating.  Musculoskeletal: Positive for joint swelling and arthralgias. Negative for myalgias, back pain and gait problem.  Skin: Negative for color change, pallor, rash and wound.  Neurological: Negative for dizziness, tremors, weakness and light-headedness.  Hematological: Negative for adenopathy. Does not bruise/bleed easily.  Psychiatric/Behavioral: Negative for behavioral problems, confusion, sleep disturbance, dysphoric mood, decreased concentration and agitation.         Objective:   Physical Exam  Constitutional: She is oriented to person, place, and time. She appears well-developed and well-nourished. No distress.  HENT:  Head: Normocephalic and atraumatic.  Mouth/Throat: Oropharynx is clear and moist. No oropharyngeal exudate.  Eyes: Conjunctivae and EOM are normal. Pupils are equal, round, and reactive to light. No scleral icterus.  Neck: Normal range of motion. Neck supple. No JVD present.  Cardiovascular: Normal rate, regular rhythm and normal heart sounds.  Exam reveals no gallop and no friction rub.   No murmur heard. Pulmonary/Chest: Effort normal and breath sounds normal. No respiratory distress. She has no wheezes. She has no rales. She exhibits no tenderness.  Abdominal: She exhibits no distension and no mass. There is no tenderness. There is no rebound and no guarding.  Musculoskeletal: She exhibits tenderness. She exhibits no edema.       Left knee is slightly tender, and warm but not hot  Lymphadenopathy:    She has no cervical adenopathy.  Neurological: She is alert and oriented to person, place, and time. She has normal reflexes. She exhibits normal muscle tone. Coordination normal.  Skin: Skin is warm and dry. She is not diaphoretic. No erythema. No pallor.  Psychiatric: She has a normal mood and affect. Her behavior is normal. Judgment and thought content normal.          Assessment & Plan:  Prosthetic joint infection I am bothered by the worsening pain after stopping antibiotics. The patient never had even a joint washout during stay. I will check esr and crp. She is seeing Dr. Jerl Santos later today. He may consider aspirating the joint for cell count, diff and crystals and culture again. I really think this could be simpler if she underwent I and D removal of the prosthetic knee followed by IV antibiotics. Prolonged oral antibiotics are problematic. She has severe PCN, Ceph allergy. She has allergy to Fluoroquinolones thought not  clear what this is. She is sulfa allergic. DOxy does not have good activity. I have written rx for zyvox but we may need to explain need to medicare and cost is 2k without insurance per 2 weeks. Further more it is not a good  Long term option. If her FQ allergy was not significant and she could tolerate levaquin this would be a cheaper more durable option. I DO STILL WORRY though that she needs remoavl of prosthetic components , formal washout and 6 weeks of vancomycin repeated  Group B streptococcal infection See above discussion  Gout WHile she certainly has gout, her cell coutn was high and she grew GROUP B strep despite 24 hours of antibiotics

## 2011-07-24 NOTE — Assessment & Plan Note (Signed)
I am bothered by the worsening pain after stopping antibiotics. The patient never had even a joint washout during stay. I will check esr and crp. She is seeing Dr. Jerl Santos later today. He may consider aspirating the joint for cell count, diff and crystals and culture again. I really think this could be simpler if she underwent I and D removal of the prosthetic knee followed by IV antibiotics. Prolonged oral antibiotics are problematic. She has severe PCN, Ceph allergy. She has allergy to Fluoroquinolones thought not clear what this is. She is sulfa allergic. DOxy does not have good activity. I have written rx for zyvox but we may need to explain need to medicare and cost is 2k without insurance per 2 weeks. Further more it is not a good  Long term option. If her FQ allergy was not significant and she could tolerate levaquin this would be a cheaper more durable option. I DO STILL WORRY though that she needs remoavl of prosthetic components , formal washout and 6 weeks of vancomycin repeated

## 2011-07-24 NOTE — Telephone Encounter (Signed)
Patient called stating that the antibiotic Dr.Van dam wrote is too expensive, her portion with insurance is $600. Is there any alternative for this patient.

## 2011-07-25 LAB — SEDIMENTATION RATE: Sed Rate: 134 mm/hr — ABNORMAL HIGH (ref 0–22)

## 2011-07-26 ENCOUNTER — Emergency Department (HOSPITAL_COMMUNITY): Payer: Medicare HMO

## 2011-07-26 ENCOUNTER — Inpatient Hospital Stay (HOSPITAL_COMMUNITY)
Admission: EM | Admit: 2011-07-26 | Discharge: 2011-08-09 | DRG: 560 | Disposition: A | Discharge status: Home Health | Payer: Medicare HMO | Attending: Internal Medicine | Admitting: Internal Medicine

## 2011-07-26 ENCOUNTER — Inpatient Hospital Stay (HOSPITAL_COMMUNITY): Payer: Medicare HMO

## 2011-07-26 DIAGNOSIS — Z87891 Personal history of nicotine dependence: Secondary | ICD-10-CM

## 2011-07-26 DIAGNOSIS — E119 Type 2 diabetes mellitus without complications: Secondary | ICD-10-CM | POA: Diagnosis present

## 2011-07-26 DIAGNOSIS — L02419 Cutaneous abscess of limb, unspecified: Secondary | ICD-10-CM | POA: Diagnosis present

## 2011-07-26 DIAGNOSIS — Y831 Surgical operation with implant of artificial internal device as the cause of abnormal reaction of the patient, or of later complication, without mention of misadventure at the time of the procedure: Secondary | ICD-10-CM

## 2011-07-26 DIAGNOSIS — A4902 Methicillin resistant Staphylococcus aureus infection, unspecified site: Secondary | ICD-10-CM | POA: Diagnosis present

## 2011-07-26 DIAGNOSIS — Z7901 Long term (current) use of anticoagulants: Secondary | ICD-10-CM

## 2011-07-26 DIAGNOSIS — R6510 Systemic inflammatory response syndrome (SIRS) of non-infectious origin without acute organ dysfunction: Secondary | ICD-10-CM | POA: Diagnosis present

## 2011-07-26 DIAGNOSIS — G4733 Obstructive sleep apnea (adult) (pediatric): Secondary | ICD-10-CM | POA: Diagnosis present

## 2011-07-26 DIAGNOSIS — I4892 Unspecified atrial flutter: Secondary | ICD-10-CM | POA: Diagnosis present

## 2011-07-26 DIAGNOSIS — J449 Chronic obstructive pulmonary disease, unspecified: Secondary | ICD-10-CM | POA: Diagnosis present

## 2011-07-26 DIAGNOSIS — Z886 Allergy status to analgesic agent status: Secondary | ICD-10-CM

## 2011-07-26 DIAGNOSIS — Z9119 Patient's noncompliance with other medical treatment and regimen: Secondary | ICD-10-CM

## 2011-07-26 DIAGNOSIS — K5900 Constipation, unspecified: Secondary | ICD-10-CM | POA: Diagnosis present

## 2011-07-26 DIAGNOSIS — M7989 Other specified soft tissue disorders: Secondary | ICD-10-CM

## 2011-07-26 DIAGNOSIS — D72829 Elevated white blood cell count, unspecified: Secondary | ICD-10-CM | POA: Diagnosis present

## 2011-07-26 DIAGNOSIS — Z7982 Long term (current) use of aspirin: Secondary | ICD-10-CM

## 2011-07-26 DIAGNOSIS — Z880 Allergy status to penicillin: Secondary | ICD-10-CM

## 2011-07-26 DIAGNOSIS — I4891 Unspecified atrial fibrillation: Secondary | ICD-10-CM | POA: Diagnosis present

## 2011-07-26 DIAGNOSIS — D638 Anemia in other chronic diseases classified elsewhere: Secondary | ICD-10-CM | POA: Diagnosis present

## 2011-07-26 DIAGNOSIS — K219 Gastro-esophageal reflux disease without esophagitis: Secondary | ICD-10-CM | POA: Diagnosis present

## 2011-07-26 DIAGNOSIS — N179 Acute kidney failure, unspecified: Secondary | ICD-10-CM | POA: Diagnosis present

## 2011-07-26 DIAGNOSIS — Z96659 Presence of unspecified artificial knee joint: Secondary | ICD-10-CM

## 2011-07-26 DIAGNOSIS — E8809 Other disorders of plasma-protein metabolism, not elsewhere classified: Secondary | ICD-10-CM | POA: Diagnosis present

## 2011-07-26 DIAGNOSIS — I5030 Unspecified diastolic (congestive) heart failure: Secondary | ICD-10-CM | POA: Diagnosis present

## 2011-07-26 DIAGNOSIS — R0602 Shortness of breath: Secondary | ICD-10-CM

## 2011-07-26 DIAGNOSIS — N189 Chronic kidney disease, unspecified: Secondary | ICD-10-CM | POA: Diagnosis present

## 2011-07-26 DIAGNOSIS — Z888 Allergy status to other drugs, medicaments and biological substances status: Secondary | ICD-10-CM

## 2011-07-26 DIAGNOSIS — J4489 Other specified chronic obstructive pulmonary disease: Secondary | ICD-10-CM | POA: Diagnosis present

## 2011-07-26 DIAGNOSIS — Z8249 Family history of ischemic heart disease and other diseases of the circulatory system: Secondary | ICD-10-CM

## 2011-07-26 DIAGNOSIS — M869 Osteomyelitis, unspecified: Secondary | ICD-10-CM | POA: Diagnosis present

## 2011-07-26 DIAGNOSIS — Z91199 Patient's noncompliance with other medical treatment and regimen due to unspecified reason: Secondary | ICD-10-CM

## 2011-07-26 DIAGNOSIS — IMO0002 Reserved for concepts with insufficient information to code with codable children: Secondary | ICD-10-CM

## 2011-07-26 DIAGNOSIS — I129 Hypertensive chronic kidney disease with stage 1 through stage 4 chronic kidney disease, or unspecified chronic kidney disease: Secondary | ICD-10-CM | POA: Diagnosis present

## 2011-07-26 DIAGNOSIS — I509 Heart failure, unspecified: Secondary | ICD-10-CM | POA: Diagnosis present

## 2011-07-26 DIAGNOSIS — Z8673 Personal history of transient ischemic attack (TIA), and cerebral infarction without residual deficits: Secondary | ICD-10-CM

## 2011-07-26 DIAGNOSIS — E662 Morbid (severe) obesity with alveolar hypoventilation: Secondary | ICD-10-CM | POA: Diagnosis present

## 2011-07-26 DIAGNOSIS — Z833 Family history of diabetes mellitus: Secondary | ICD-10-CM

## 2011-07-26 DIAGNOSIS — T8450XA Infection and inflammatory reaction due to unspecified internal joint prosthesis, initial encounter: Secondary | ICD-10-CM

## 2011-07-26 DIAGNOSIS — T847XXA Infection and inflammatory reaction due to other internal orthopedic prosthetic devices, implants and grafts, initial encounter: Principal | ICD-10-CM | POA: Diagnosis present

## 2011-07-26 DIAGNOSIS — M79609 Pain in unspecified limb: Secondary | ICD-10-CM

## 2011-07-26 DIAGNOSIS — I2789 Other specified pulmonary heart diseases: Secondary | ICD-10-CM | POA: Diagnosis present

## 2011-07-26 DIAGNOSIS — Z882 Allergy status to sulfonamides status: Secondary | ICD-10-CM

## 2011-07-26 LAB — BASIC METABOLIC PANEL WITH GFR
BUN: 22 mg/dL (ref 6–23)
Calcium: 9.3 mg/dL (ref 8.4–10.5)
Chloride: 102 meq/L (ref 96–112)
Creatinine, Ser: 1.61 mg/dL — ABNORMAL HIGH (ref 0.50–1.10)
GFR calc Af Amer: 39 mL/min — ABNORMAL LOW (ref >60)
GFR calc non Af Amer: 33 mL/min — ABNORMAL LOW (ref >60)

## 2011-07-26 LAB — PRO B NATRIURETIC PEPTIDE: Pro B Natriuretic peptide (BNP): 3063 pg/mL — ABNORMAL HIGH (ref 0–125)

## 2011-07-26 LAB — CARDIAC PANEL(CRET KIN+CKTOT+MB+TROPI)
CK, MB: 1.8 ng/mL (ref 0.3–4.0)
Relative Index: 1.1 (ref 0.0–2.5)
Relative Index: 1.3 (ref 0.0–2.5)
Total CK: 163 U/L (ref 7–177)
Total CK: 149 U/L (ref 7–177)
Troponin I: <0.3 ng/mL (ref <0.30)

## 2011-07-26 LAB — DIFFERENTIAL
Basophils Absolute: 0 K/uL (ref 0.0–0.1)
Basophils Relative: 0 % (ref 0–1)
Eosinophils Absolute: 0 10*3/uL (ref 0.0–0.7)
Eosinophils Relative: 0 % (ref 0–5)
Lymphocytes Relative: 3 % — ABNORMAL LOW (ref 12–46)
Lymphs Abs: 0.4 10*3/uL — ABNORMAL LOW (ref 0.7–4.0)
Monocytes Absolute: 0.1 K/uL (ref 0.1–1.0)
Monocytes Relative: 1 % — ABNORMAL LOW (ref 3–12)
Neutro Abs: 13.2 K/uL — ABNORMAL HIGH (ref 1.7–7.7)
Neutrophils Relative %: 96 % — ABNORMAL HIGH (ref 43–77)

## 2011-07-26 LAB — POCT I-STAT 3, VENOUS BLOOD GAS (G3P V)
Bicarbonate: 23.8 mEq/L (ref 20.0–24.0)
O2 Saturation: 81 %
TCO2: 25 mmol/L (ref 0–100)
pCO2, Ven: 35.2 mmHg — ABNORMAL LOW (ref 45.0–50.0)
pH, Ven: 7.438 — ABNORMAL HIGH (ref 7.250–7.300)
pO2, Ven: 43 mmHg (ref 30.0–45.0)

## 2011-07-26 LAB — BASIC METABOLIC PANEL
CO2: 25 mEq/L (ref 19–32)
Glucose, Bld: 199 mg/dL — ABNORMAL HIGH (ref 70–99)
Potassium: 3.7 mEq/L (ref 3.5–5.1)
Sodium: 138 mEq/L (ref 135–145)

## 2011-07-26 LAB — POCT I-STAT TROPONIN I: Troponin i, poc: 0.01 ng/mL (ref 0.00–0.08)

## 2011-07-26 LAB — CBC
HCT: 33 % — ABNORMAL LOW (ref 36.0–46.0)
Hemoglobin: 10.3 g/dL — ABNORMAL LOW (ref 12.0–15.0)
MCH: 28 pg (ref 26.0–34.0)
MCHC: 31.2 g/dL (ref 30.0–36.0)
MCV: 89.7 fL (ref 78.0–100.0)
Platelets: 255 10*3/uL (ref 150–400)
RBC: 3.68 MIL/uL — ABNORMAL LOW (ref 3.87–5.11)
RDW: 18.7 % — ABNORMAL HIGH (ref 11.5–15.5)
WBC: 13.7 10*3/uL — ABNORMAL HIGH (ref 4.0–10.5)

## 2011-07-26 LAB — URINE MICROSCOPIC-ADD ON

## 2011-07-26 LAB — URINALYSIS, ROUTINE W REFLEX MICROSCOPIC
Bilirubin Urine: NEGATIVE
Glucose, UA: NEGATIVE mg/dL
Ketones, ur: NEGATIVE mg/dL
Leukocytes, UA: NEGATIVE
Nitrite: NEGATIVE
Protein, ur: 100 mg/dL — AB
Specific Gravity, Urine: 1.018 (ref 1.005–1.030)
Urobilinogen, UA: 1 mg/dL (ref 0.0–1.0)
pH: 5 (ref 5.0–8.0)

## 2011-07-26 LAB — HEMOGLOBIN A1C: Mean Plasma Glucose: 166 mg/dL — ABNORMAL HIGH (ref <117)

## 2011-07-26 LAB — DIGOXIN LEVEL: Digoxin Level: 1.1 ng/mL (ref 0.8–2.0)

## 2011-07-26 LAB — TSH: TSH: 1.977 u[IU]/mL (ref 0.350–4.500)

## 2011-07-26 LAB — PROCALCITONIN: Procalcitonin: 0.27 ng/mL

## 2011-07-26 LAB — PROTIME-INR
INR: 4.13 — ABNORMAL HIGH (ref 0.00–1.49)
Prothrombin Time: 40.6 seconds — ABNORMAL HIGH (ref 11.6–15.2)

## 2011-07-26 LAB — APTT: aPTT: 110 s — ABNORMAL HIGH (ref 24–37)

## 2011-07-26 LAB — LACTIC ACID, PLASMA: Lactic Acid, Venous: 1.4 mmol/L (ref 0.5–2.2)

## 2011-07-27 LAB — CBC
MCH: 27.6 pg (ref 26.0–34.0)
MCV: 93.2 fL (ref 78.0–100.0)
Platelets: 208 10*3/uL (ref 150–400)
RBC: 2.94 MIL/uL — ABNORMAL LOW (ref 3.87–5.11)

## 2011-07-27 LAB — MAGNESIUM: Magnesium: 2 mg/dL (ref 1.5–2.5)

## 2011-07-27 LAB — URINE CULTURE
Colony Count: >=100000
Culture  Setup Time: 201208170939

## 2011-07-27 LAB — GLUCOSE, CAPILLARY
Glucose-Capillary: 156 mg/dL — ABNORMAL HIGH (ref 70–99)
Glucose-Capillary: 174 mg/dL — ABNORMAL HIGH (ref 70–99)

## 2011-07-27 LAB — COMPREHENSIVE METABOLIC PANEL
Alkaline Phosphatase: 66 U/L (ref 39–117)
BUN: 23 mg/dL (ref 6–23)
GFR calc Af Amer: 32 mL/min — ABNORMAL LOW (ref >60)
GFR calc non Af Amer: 26 mL/min — ABNORMAL LOW (ref >60)
Glucose, Bld: 148 mg/dL — ABNORMAL HIGH (ref 70–99)
Potassium: 4.5 mEq/L (ref 3.5–5.1)
Total Bilirubin: 0.2 mg/dL — ABNORMAL LOW (ref 0.3–1.2)
Total Protein: 6.7 g/dL (ref 6.0–8.3)

## 2011-07-27 LAB — DIFFERENTIAL
Eosinophils Absolute: 0.1 10*3/uL (ref 0.0–0.7)
Eosinophils Relative: 2 % (ref 0–5)
Lymphs Abs: 0.8 10*3/uL (ref 0.7–4.0)
Monocytes Absolute: 0.3 10*3/uL (ref 0.1–1.0)

## 2011-07-28 DIAGNOSIS — T8450XA Infection and inflammatory reaction due to unspecified internal joint prosthesis, initial encounter: Secondary | ICD-10-CM

## 2011-07-28 DIAGNOSIS — Y831 Surgical operation with implant of artificial internal device as the cause of abnormal reaction of the patient, or of later complication, without mention of misadventure at the time of the procedure: Secondary | ICD-10-CM

## 2011-07-28 LAB — PROTIME-INR
INR: 3.56 — ABNORMAL HIGH (ref 0.00–1.49)
Prothrombin Time: 36.1 seconds — ABNORMAL HIGH (ref 11.6–15.2)

## 2011-07-29 LAB — COMPREHENSIVE METABOLIC PANEL
ALT: 25 U/L (ref 0–35)
AST: 20 U/L (ref 0–37)
Alkaline Phosphatase: 80 U/L (ref 39–117)
CO2: 35 mEq/L — ABNORMAL HIGH (ref 19–32)
GFR calc Af Amer: 42 mL/min — ABNORMAL LOW (ref >60)
Glucose, Bld: 159 mg/dL — ABNORMAL HIGH (ref 70–99)
Potassium: 4.2 mEq/L (ref 3.5–5.1)
Sodium: 140 mEq/L (ref 135–145)
Total Protein: 7.6 g/dL (ref 6.0–8.3)

## 2011-07-29 LAB — CBC
HCT: 27.2 % — ABNORMAL LOW (ref 36.0–46.0)
MCV: 91.6 fL (ref 78.0–100.0)
Platelets: 177 10*3/uL (ref 150–400)
RBC: 2.97 MIL/uL — ABNORMAL LOW (ref 3.87–5.11)
WBC: 6.8 10*3/uL (ref 4.0–10.5)

## 2011-07-29 LAB — PROTIME-INR: INR: 2.52 — ABNORMAL HIGH (ref 0.00–1.49)

## 2011-07-30 ENCOUNTER — Telehealth: Payer: Self-pay | Admitting: *Deleted

## 2011-07-30 LAB — CBC
Hemoglobin: 8 g/dL — ABNORMAL LOW (ref 12.0–15.0)
MCH: 28.1 pg (ref 26.0–34.0)
Platelets: 190 10*3/uL (ref 150–400)
RBC: 2.85 MIL/uL — ABNORMAL LOW (ref 3.87–5.11)
WBC: 10.1 10*3/uL (ref 4.0–10.5)

## 2011-07-30 LAB — RETICULOCYTES
RBC.: 2.85 MIL/uL — ABNORMAL LOW (ref 3.87–5.11)
Retic Count, Absolute: 65.6 10*3/uL (ref 19.0–186.0)
Retic Ct Pct: 2.3 % (ref 0.4–3.1)

## 2011-07-30 LAB — COMPREHENSIVE METABOLIC PANEL
ALT: 25 U/L (ref 0–35)
AST: 24 U/L (ref 0–37)
Albumin: 2.6 g/dL — ABNORMAL LOW (ref 3.5–5.2)
Alkaline Phosphatase: 87 U/L (ref 39–117)
Calcium: 9 mg/dL (ref 8.4–10.5)
Potassium: 4.1 mEq/L (ref 3.5–5.1)
Sodium: 139 mEq/L (ref 135–145)
Total Protein: 7.5 g/dL (ref 6.0–8.3)

## 2011-07-30 LAB — GLUCOSE, CAPILLARY
Glucose-Capillary: 194 mg/dL — ABNORMAL HIGH (ref 70–99)
Glucose-Capillary: 165 mg/dL — ABNORMAL HIGH (ref 70–99)
Glucose-Capillary: 198 mg/dL — ABNORMAL HIGH (ref 70–99)
Glucose-Capillary: 187 mg/dL — ABNORMAL HIGH (ref 70–99)
Glucose-Capillary: 216 mg/dL — ABNORMAL HIGH (ref 70–99)

## 2011-07-30 LAB — IRON AND TIBC
Iron: 41 ug/dL — ABNORMAL LOW (ref 42–135)
Saturation Ratios: 17 % — ABNORMAL LOW (ref 20–55)
TIBC: 236 ug/dL — ABNORMAL LOW (ref 250–470)

## 2011-07-30 NOTE — Telephone Encounter (Signed)
I am taking care of her RIGHT NOW in the hospital. We will consider levaquin AFTER she finishes having prosthetic material removed and 6 wks of repeat vancomycin.

## 2011-07-30 NOTE — Telephone Encounter (Signed)
Fax rec'd from [employer]. Pt wants something other than zyvox. Cannot afford

## 2011-07-30 NOTE — Telephone Encounter (Signed)
I am NOT GOING TO GIVE HER ZYVOX and she is GETTING IV ANTIBIOTICS RIGHT NOW SO NO ALTERNATIVE IS NEEDED AT THIS TIME

## 2011-07-31 LAB — CBC
Hemoglobin: 9.3 g/dL — ABNORMAL LOW (ref 12.0–15.0)
MCH: 28.8 pg (ref 26.0–34.0)
MCHC: 31.8 g/dL (ref 30.0–36.0)
MCV: 90.4 fL (ref 78.0–100.0)
Platelets: 227 10*3/uL (ref 150–400)
RBC: 3.23 MIL/uL — ABNORMAL LOW (ref 3.87–5.11)

## 2011-07-31 LAB — DIFFERENTIAL
Basophils Absolute: 0 K/uL (ref 0.0–0.1)
Basophils Relative: 0 % (ref 0–1)
Eosinophils Absolute: 0.2 K/uL (ref 0.0–0.7)
Eosinophils Relative: 2 % (ref 0–5)
Lymphocytes Relative: 7 % — ABNORMAL LOW (ref 12–46)
Lymphs Abs: 0.7 K/uL (ref 0.7–4.0)
Monocytes Absolute: 0.2 K/uL (ref 0.1–1.0)
Monocytes Relative: 2 % — ABNORMAL LOW (ref 3–12)
Neutro Abs: 8.6 K/uL — ABNORMAL HIGH (ref 1.7–7.7)
Neutrophils Relative %: 89 % — ABNORMAL HIGH (ref 43–77)

## 2011-07-31 LAB — COMPREHENSIVE METABOLIC PANEL
CO2: 34 mEq/L — ABNORMAL HIGH (ref 19–32)
Calcium: 9.2 mg/dL (ref 8.4–10.5)
Creatinine, Ser: 1.87 mg/dL — ABNORMAL HIGH (ref 0.50–1.10)
GFR calc Af Amer: 33 mL/min — ABNORMAL LOW (ref >60)
GFR calc non Af Amer: 27 mL/min — ABNORMAL LOW (ref >60)
Glucose, Bld: 200 mg/dL — ABNORMAL HIGH (ref 70–99)

## 2011-07-31 LAB — PROTIME-INR
INR: 1.81 — ABNORMAL HIGH (ref 0.00–1.49)
Prothrombin Time: 21.3 seconds — ABNORMAL HIGH (ref 11.6–15.2)

## 2011-07-31 LAB — PREPARE RBC (CROSSMATCH)

## 2011-07-31 LAB — GLUCOSE, CAPILLARY: Glucose-Capillary: 167 mg/dL — ABNORMAL HIGH (ref 70–99)

## 2011-08-01 LAB — PROTIME-INR
INR: 1.66 — ABNORMAL HIGH (ref 0.00–1.49)
Prothrombin Time: 19.9 seconds — ABNORMAL HIGH (ref 11.6–15.2)

## 2011-08-01 LAB — CULTURE, BLOOD (ROUTINE X 2)
Culture  Setup Time: 201208170848
Culture  Setup Time: 201208170848
Culture: NO GROWTH
Culture: NO GROWTH

## 2011-08-01 LAB — CBC
Platelets: 249 10*3/uL (ref 150–400)
RBC: 3.41 MIL/uL — ABNORMAL LOW (ref 3.87–5.11)
WBC: 8 10*3/uL (ref 4.0–10.5)

## 2011-08-01 LAB — TYPE AND SCREEN
ABO/RH(D): A POS
Antibody Screen: NEGATIVE
Unit division: 0
Unit division: 0

## 2011-08-01 LAB — PRO B NATRIURETIC PEPTIDE: Pro B Natriuretic peptide (BNP): 1314 pg/mL — ABNORMAL HIGH (ref 0–125)

## 2011-08-01 LAB — GLUCOSE, CAPILLARY
Glucose-Capillary: 154 mg/dL — ABNORMAL HIGH (ref 70–99)
Glucose-Capillary: 215 mg/dL — ABNORMAL HIGH (ref 70–99)

## 2011-08-02 DIAGNOSIS — T8450XA Infection and inflammatory reaction due to unspecified internal joint prosthesis, initial encounter: Secondary | ICD-10-CM

## 2011-08-02 LAB — BASIC METABOLIC PANEL
BUN: 37 mg/dL — ABNORMAL HIGH (ref 6–23)
CO2: 32 mEq/L (ref 19–32)
Chloride: 96 mEq/L (ref 96–112)
GFR calc non Af Amer: 29 mL/min — ABNORMAL LOW (ref >60)
Glucose, Bld: 173 mg/dL — ABNORMAL HIGH (ref 70–99)
Potassium: 4.6 mEq/L (ref 3.5–5.1)

## 2011-08-02 LAB — VANCOMYCIN, TROUGH: Vancomycin Tr: 22.3 ug/mL — ABNORMAL HIGH (ref 10.0–20.0)

## 2011-08-02 LAB — CBC
MCH: 28.2 pg (ref 26.0–34.0)
MCV: 91.1 fL (ref 78.0–100.0)
Platelets: 271 10*3/uL (ref 150–400)
Platelets: 293 10*3/uL (ref 150–400)
RBC: 3.37 MIL/uL — ABNORMAL LOW (ref 3.87–5.11)
RBC: 3.74 MIL/uL — ABNORMAL LOW (ref 3.87–5.11)
WBC: 8.3 10*3/uL (ref 4.0–10.5)

## 2011-08-02 LAB — PRO B NATRIURETIC PEPTIDE: Pro B Natriuretic peptide (BNP): 659 pg/mL — ABNORMAL HIGH (ref 0–125)

## 2011-08-02 LAB — GLUCOSE, CAPILLARY: Glucose-Capillary: 132 mg/dL — ABNORMAL HIGH (ref 70–99)

## 2011-08-03 LAB — GLUCOSE, CAPILLARY
Glucose-Capillary: 176 mg/dL — ABNORMAL HIGH (ref 70–99)
Glucose-Capillary: 199 mg/dL — ABNORMAL HIGH (ref 70–99)

## 2011-08-04 LAB — GLUCOSE, CAPILLARY: Glucose-Capillary: 165 mg/dL — ABNORMAL HIGH (ref 70–99)

## 2011-08-04 LAB — PROTIME-INR: Prothrombin Time: 22.6 seconds — ABNORMAL HIGH (ref 11.6–15.2)

## 2011-08-05 LAB — CBC
HCT: 34.2 % — ABNORMAL LOW (ref 36.0–46.0)
Hemoglobin: 10.5 g/dL — ABNORMAL LOW (ref 12.0–15.0)
MCH: 28.4 pg (ref 26.0–34.0)
MCHC: 30.7 g/dL (ref 30.0–36.0)
MCV: 92.4 fL (ref 78.0–100.0)
Platelets: 230 10*3/uL (ref 150–400)
RBC: 3.7 MIL/uL — ABNORMAL LOW (ref 3.87–5.11)
RDW: 17.3 % — ABNORMAL HIGH (ref 11.5–15.5)
WBC: 6.4 10*3/uL (ref 4.0–10.5)

## 2011-08-05 LAB — BASIC METABOLIC PANEL
CO2: 32 mEq/L (ref 19–32)
Calcium: 9.6 mg/dL (ref 8.4–10.5)
Creatinine, Ser: 2.03 mg/dL — ABNORMAL HIGH (ref 0.50–1.10)
GFR calc non Af Amer: 25 mL/min — ABNORMAL LOW (ref >60)
Glucose, Bld: 189 mg/dL — ABNORMAL HIGH (ref 70–99)
Sodium: 138 mEq/L (ref 135–145)

## 2011-08-05 LAB — GLUCOSE, CAPILLARY
Glucose-Capillary: 217 mg/dL — ABNORMAL HIGH (ref 70–99)
Glucose-Capillary: 255 mg/dL — ABNORMAL HIGH (ref 70–99)
Glucose-Capillary: 212 mg/dL — ABNORMAL HIGH (ref 70–99)

## 2011-08-05 LAB — PROTIME-INR
INR: 2.13 — ABNORMAL HIGH (ref 0.00–1.49)
Prothrombin Time: 24.2 s — ABNORMAL HIGH (ref 11.6–15.2)

## 2011-08-05 LAB — BASIC METABOLIC PANEL WITH GFR
BUN: 45 mg/dL — ABNORMAL HIGH (ref 6–23)
Chloride: 98 meq/L (ref 96–112)
GFR calc Af Amer: 30 mL/min — ABNORMAL LOW (ref >60)
Potassium: 4.4 meq/L (ref 3.5–5.1)

## 2011-08-06 LAB — GLUCOSE, CAPILLARY
Glucose-Capillary: 201 mg/dL — ABNORMAL HIGH (ref 70–99)
Glucose-Capillary: 215 mg/dL — ABNORMAL HIGH (ref 70–99)

## 2011-08-06 LAB — COMPREHENSIVE METABOLIC PANEL
Albumin: 3.1 g/dL — ABNORMAL LOW (ref 3.5–5.2)
Alkaline Phosphatase: 89 U/L (ref 39–117)
BUN: 51 mg/dL — ABNORMAL HIGH (ref 6–23)
CO2: 26 mEq/L (ref 19–32)
Chloride: 99 mEq/L (ref 96–112)
Creatinine, Ser: 2.05 mg/dL — ABNORMAL HIGH (ref 0.50–1.10)
GFR calc non Af Amer: 25 mL/min — ABNORMAL LOW (ref >60)
Glucose, Bld: 213 mg/dL — ABNORMAL HIGH (ref 70–99)
Potassium: 4.7 mEq/L (ref 3.5–5.1)
Total Bilirubin: 0.2 mg/dL — ABNORMAL LOW (ref 0.3–1.2)

## 2011-08-06 LAB — PROTIME-INR: Prothrombin Time: 29.2 seconds — ABNORMAL HIGH (ref 11.6–15.2)

## 2011-08-07 ENCOUNTER — Inpatient Hospital Stay (HOSPITAL_COMMUNITY): Payer: Medicare HMO

## 2011-08-07 LAB — GLUCOSE, CAPILLARY
Glucose-Capillary: 208 mg/dL — ABNORMAL HIGH (ref 70–99)
Glucose-Capillary: 246 mg/dL — ABNORMAL HIGH (ref 70–99)

## 2011-08-07 LAB — PROTIME-INR
INR: 2.76 — ABNORMAL HIGH (ref 0.00–1.49)
Prothrombin Time: 29.6 seconds — ABNORMAL HIGH (ref 11.6–15.2)

## 2011-08-07 LAB — COMPREHENSIVE METABOLIC PANEL
AST: 59 U/L — ABNORMAL HIGH (ref 0–37)
Albumin: 3.3 g/dL — ABNORMAL LOW (ref 3.5–5.2)
Alkaline Phosphatase: 91 U/L (ref 39–117)
BUN: 57 mg/dL — ABNORMAL HIGH (ref 6–23)
Creatinine, Ser: 2.3 mg/dL — ABNORMAL HIGH (ref 0.50–1.10)
Potassium: 4.9 mEq/L (ref 3.5–5.1)
Total Protein: 7.8 g/dL (ref 6.0–8.3)

## 2011-08-08 LAB — COMPREHENSIVE METABOLIC PANEL WITH GFR
ALT: 45 U/L — ABNORMAL HIGH (ref 0–35)
Albumin: 3.1 g/dL — ABNORMAL LOW (ref 3.5–5.2)
Alkaline Phosphatase: 83 U/L (ref 39–117)
Calcium: 9.5 mg/dL (ref 8.4–10.5)
GFR calc Af Amer: 33 mL/min — ABNORMAL LOW (ref >60)
Potassium: 4.5 meq/L (ref 3.5–5.1)
Sodium: 140 meq/L (ref 135–145)
Total Protein: 7.1 g/dL (ref 6.0–8.3)

## 2011-08-08 LAB — GLUCOSE, CAPILLARY
Glucose-Capillary: 191 mg/dL — ABNORMAL HIGH (ref 70–99)
Glucose-Capillary: 178 mg/dL — ABNORMAL HIGH (ref 70–99)

## 2011-08-08 LAB — COMPREHENSIVE METABOLIC PANEL
AST: 39 U/L — ABNORMAL HIGH (ref 0–37)
BUN: 51 mg/dL — ABNORMAL HIGH (ref 6–23)
CO2: 30 mEq/L (ref 19–32)
Chloride: 104 mEq/L (ref 96–112)
Creatinine, Ser: 1.89 mg/dL — ABNORMAL HIGH (ref 0.50–1.10)
GFR calc non Af Amer: 27 mL/min — ABNORMAL LOW (ref >60)
Glucose, Bld: 204 mg/dL — ABNORMAL HIGH (ref 70–99)
Total Bilirubin: 0.2 mg/dL — ABNORMAL LOW (ref 0.3–1.2)

## 2011-08-08 LAB — CBC
HCT: 31.4 % — ABNORMAL LOW (ref 36.0–46.0)
Hemoglobin: 9.4 g/dL — ABNORMAL LOW (ref 12.0–15.0)
MCH: 27.3 pg (ref 26.0–34.0)
MCHC: 29.9 g/dL — ABNORMAL LOW (ref 30.0–36.0)
MCV: 91.3 fL (ref 78.0–100.0)
Platelets: 244 K/uL (ref 150–400)
RBC: 3.44 MIL/uL — ABNORMAL LOW (ref 3.87–5.11)
RDW: 17.1 % — ABNORMAL HIGH (ref 11.5–15.5)
WBC: 6.5 K/uL (ref 4.0–10.5)

## 2011-08-08 LAB — PROTIME-INR
INR: 2.57 — ABNORMAL HIGH (ref 0.00–1.49)
Prothrombin Time: 28 s — ABNORMAL HIGH (ref 11.6–15.2)

## 2011-08-09 ENCOUNTER — Inpatient Hospital Stay (HOSPITAL_COMMUNITY): Payer: Medicare HMO

## 2011-08-09 LAB — PROTIME-INR
INR: 2.72 — ABNORMAL HIGH (ref 0.00–1.49)
Prothrombin Time: 29.3 seconds — ABNORMAL HIGH (ref 11.6–15.2)

## 2011-08-09 LAB — COMPREHENSIVE METABOLIC PANEL
AST: 34 U/L (ref 0–37)
Albumin: 2.9 g/dL — ABNORMAL LOW (ref 3.5–5.2)
BUN: 46 mg/dL — ABNORMAL HIGH (ref 6–23)
CO2: 29 mEq/L (ref 19–32)
Calcium: 9.4 mg/dL (ref 8.4–10.5)
Creatinine, Ser: 1.78 mg/dL — ABNORMAL HIGH (ref 0.50–1.10)
GFR calc non Af Amer: 29 mL/min — ABNORMAL LOW (ref >60)

## 2011-08-09 LAB — GLUCOSE, CAPILLARY: Glucose-Capillary: 260 mg/dL — ABNORMAL HIGH (ref 70–99)

## 2011-08-09 LAB — URIC ACID: Uric Acid, Serum: 11 mg/dL — ABNORMAL HIGH (ref 2.4–7.0)

## 2011-08-09 LAB — DIGOXIN LEVEL: Digoxin Level: 0.5 ng/mL — ABNORMAL LOW (ref 0.8–2.0)

## 2011-08-13 NOTE — H&P (Signed)
NAME:  TRIVA, HUEBER NO.:  [id number]  MEDICAL RECORD NO.:  [id number]  LOCATION:  [facility]                         FACILITY:  [facility]  PHYSICIAN:  Eduard Clos, MDDATE OF BIRTH:  06-12-1951  DATE OF ADMISSION:  07/26/2011 DATE OF DISCHARGE:                             HISTORY & PHYSICAL   PRIMARY CARE PHYSICIAN:  [name] L. [name], M.D.  INFECTIOUS DISEASE PHYSICIAN:  Acey Lav, M.D.  CHIEF COMPLAINT:  Shortness of breath.  HISTORY OF PRESENT ILLNESS:  A 60 year old female who was recently discharged from the hospital on June 23, 2011, who was being treated for osteomyelitis with cellulitis of the left leg and had seen at that time having CHF, AFib with RVR, and had undergone TEE cardioversion.  Was felt not to be a candidate for any ablation due to multiple  atrial arrhythmias followed by Dr. Graciela Husbands with a history of diabetes mellitus type 2 with hypertension, multiple antibiotic allergies, COPD, psoriasis.  At this time, she complains of having shortness of breath over the last few days.  In addition, the patient also felt nauseous and her abdomen was a bit bloated.  The patient denies experiencing any fever, chills, or cough or phlegm.  Denies any dysuria or discharge. Denies any dizziness, loss of consciousness, any focal deficits.  In the ER, the patient was found to be in atrial flutter with RVR.  In addition, the patient was found to be febrile, temperature of 101.8 with left lower extremity pain.  It is swollen, erythematous, and warm to touch.  With leukocytosis at this time, the patient will be started on Cardizem drip and vancomycin.  The patient has been re-paced with flutter with RVR with fever most likely because of her left lower extremity cellulitis with recent osteomyelitis.  The patient has previously been followed with Dr. Daiva Eves, her infectious disease doctor, who is trying to place her on chronic antibiotic dose and she was  recently on doxycycline.  PAST MEDICAL HISTORY:  Recent admission for cellulitis and osteomyelitis of left lower extremity, CHF with recent 2-D echo on June 03, 2011, showed an EF of 45-50% and TEE showed EF of 50-55%.  On June 05, 2011, history of atrial flutter/fib, on Coumadin therapy.  History of psoriasis, history of diabetes mellitus type 2, history of morbid obesity, history of OSA, history of chronic heart disease, history of tobacco abuse, history of left knee joint replacement, history of medical noncompliance, history of gastroesophageal reflux disease, history of endoscopy, history of chronic kidney disease, history of weightbearing osteopathy, history of CVA, history of chronic anticoagulation on warfarin.  PAST SURGICAL HISTORY:  Back surgery, hysterectomy, knee replacement, and also thumb surgery and also reported hysterectomy per patient.  MEDICATIONS:  On admission, the patient is on: 1. Ambien 5 mg p.o. daily. 2. Warfarin 2 mg p.o. daily. 3. Senna 2 tablets as needed. 4. __________ statin 10 mg p.o. daily. 5. __________ mg p.o. daily. 6. Protonix 40 mg p.o. daily. 7. Metoprolol 50 mg one tablet twice. 8. Methotrexate 2.5 mg four tablets weekly. 9. Xopenex nebulizer. 10.NovoLog sliding scale. 11.Lisinopril/hydrochlorothiazide __________ /25 p.o. q.4 hours as     needed. 12.__________ 80 mg p.o.  twice daily. 13.Folic acid 1 mg p.o. daily. 14.Ferrous gluconate 325 mg p.o. daily. 15.Digoxin 0.125 mg daily. 16.Desonide 0.5% ointment topical one application twice a day. 17.Clobetasol 2-3 applications three times a day. 18.Aspirin 81 mg p.o. daily. 19.Amiodarone 200 mg p.o. daily.  ALLERGIES:  ERYTHROMYCIN, CODEINE, PENICILLIN, FLUOXETINE,  OFLOXACIN, ATENOLOL, CELECOXIB, SULFA, CEPHALOSPORINS.  SOCIAL HISTORY:  The patient is married, lives with her husband.  She denies any current tobacco abuse, alcohol or drug abuse.  FAMILY HISTORY:  Significant for  diabetes, coronary artery disease, obesity, and renal disease.  REVIEW OF SYMPTOMS:  As per history of present illness, nothing else significant.  PHYSICAL EXAMINATION:  GENERAL:  Patient examined at bedside, not in any acute distress. VITAL SIGNS:  Blood pressure 156/73, pulse is 114 per minute, temperature 101.8, respirations 24, O2 sats 98%. HEENT:  Anicteric, no pallor.  No discharge from ears, eyes, nose, or mouth.  PERRLA  positive.  No facial asymmetry. NECK:  No neck rigidity. CHEST:  Bilateral air entry present.  No rhonchi.  No crepitation. HEART:  S1 and S2 heard. ABDOMEN:  Soft, nontender, bowel sounds heard. CNS:  Awake, alert, oriented to person, place, and time.  Moves upper and lower extremities 5/5. EXTREMITIES:  With bilateral nonpitting edema extending up to her thighs.  Her left lower extremity is erythematous.  It is more dark in color.  It is warm to touch up to her knees.  She is able to bend her both knees without any pain.  No acute ischemic changes, cyanosis, or clubbing.  LABS:  EKG shows atrial flutter with rate around 132 beats per minute. Chest x-ray shows mild congestive changes in the heart and lungs with mild BIP.  CBC:  WBC 13.7, hemoglobin 10.3, hematocrit 33, hemoglobin last one was 9.7, platelets 255,000, neutrophils 96%.  PT/INR is 40.6 and 4.13.  Basic metabolic panel:  Sodium 138, potassium 3.7, chloride 102, carbon dioxide 25, glucose 199.  BUN 22, creatinine 1.6.  Calcium 9.3, lactic acid 1.4.  Troponin 0.01, BNP 3063.  UA shows cloudy urine, negative for nitrite and leukocytes, squamous cells many, wbc's 3 to 6, rbc's 7 to 10, bacteria many.  Cultures are pending.  ASSESSMENT: 1. Atrial flutter with rapid ventricular rate. 2. Decompensated congestive heart failure, diastolic, with recent TEE     showing ejection fraction of 60-65% on June 05, 2011. 3. Fever, most likely source is cellulitis of the left lower extremity     with recent  admission for osteomyelitis of her left lower     extremity. 4. Diabetes mellitus type 2. 5. Chronic kidney disease, creatinine out of the baseline. 6. Obstructive sleep apnea. 7. Morbid obesity. 8. Coagulopathy from Coumadin. 9. History of hypertension. 10.History of pulmonary hypertension.  PLAN: 1. At this time, we will admit the patient to step-down unit. 2. Follow her atrial flutter with RVR.  At this time, probably the     precipitating factor could be fever.  At this time, the patient     will be started on Cardizem drip, which we will continue.  I will     try to wean off once her heart rate gets better controlled and     also, once she takes the oral metoprolol dose.  At this time, we     will likely check a digoxin level.  We may need to consult     Cardiology; the heart rate has to be better controlled. 3. For her fever, which at this  time most likely source is left lower     extremity cellulitis, the patient did have recent admission for the     same and is found to have chronic antibiotic suppressive therapy. 4. For her cellulitis and osteomyelitis, at this time we will need x-     rays of the tibia and fibula of the left lower extremity along with     the knee to make sure there is no bony involvement.  At this time,     the patient has been started on vancomycin, which we will continue.     Probably, the patient will need Infectious Disease consult.  We     will also get Doppler of the lower extremity as the patient has     erythema and edema. 5. Congestive heart failure.  This is probably from her atrial flutter     from RVR. We will continue with __________, digoxin, metoprolol,     Lasix.  Lasix, I will switch it to IV, __________more better. 6. Coagulopathy from Coumadin. Coumadin dose per pharmacy. 7. Diabetes mellitus type 2.  The patient on insulin sliding scale.     Further recommendations pending above.     Eduard Clos, MD     ANK/MEDQ  D:   07/26/2011  T:  07/26/2011  Job:  [id number]  cc:   Minerva Areola L. [name], M.D. Acey Lav, MD  Electronically Signed by Midge Minium MD on 08/13/2011 09:36:18 AM

## 2011-08-26 ENCOUNTER — Encounter: Payer: Self-pay | Admitting: Infectious Disease

## 2011-08-26 ENCOUNTER — Ambulatory Visit (INDEPENDENT_AMBULATORY_CARE_PROVIDER_SITE_OTHER): Payer: Medicare HMO | Admitting: Infectious Disease

## 2011-08-26 VITALS — BP 166/62 | HR 52 | Temp 98.5°F

## 2011-08-26 DIAGNOSIS — I5040 Unspecified combined systolic (congestive) and diastolic (congestive) heart failure: Secondary | ICD-10-CM

## 2011-08-26 DIAGNOSIS — B951 Streptococcus, group B, as the cause of diseases classified elsewhere: Secondary | ICD-10-CM

## 2011-08-26 DIAGNOSIS — T8450XA Infection and inflammatory reaction due to unspecified internal joint prosthesis, initial encounter: Secondary | ICD-10-CM

## 2011-08-26 DIAGNOSIS — A491 Streptococcal infection, unspecified site: Secondary | ICD-10-CM

## 2011-08-26 NOTE — Assessment & Plan Note (Signed)
Continue vancomycin for now, check esr and crp. If encouraging, will pull PICC and try to suppress with oral levaquin once IV vanco is done

## 2011-08-26 NOTE — Patient Instructions (Signed)
We will check blood work today.  If encouraging we will then have them remove the PICC when your vancomycin is scheduled to be stopped and we will give you rx for levaquin that you will need to take indefinitely

## 2011-08-26 NOTE — Progress Notes (Signed)
Subjective:    Patient ID: [name], female    DOB: [birth year], 60 y.o.   MRN: [id number]  HPI  [name] is a 60 year old African American female with a past medical history significant for multiple medical problems including atrial fibrillation with rapid ventricular  response, congestive heart failure, obesity hypoventilation syndrome,  diabetes mellitus, tobacco use, and left-sided knee replacement WITH HISTORY OF PROSTHETIC KNEE INFECTION IN 2001, SP TEQUIN AND RIFAMPIN WITH TWO STAGED JOINT REPLACEMENT, AND HISTORY OF MRSA FROM KNEE WOUND IN 2007. She was admitted on the 24th of June with 2 days of worsening left lower extremity pain. She was unable to support much weight on that side. Initially when she was seen in the emergency apartment, she was found to be febrile to 102 with elevated white blood cell count. She was thought to have redness in her left lower extremity with concern for possible cellulitis. She was known to have gout as well. She was  feeling ill and was in atrial flutter with a rapid response in the 120s. She was brought in to the critical care for further management. On admission, she was started empirically on vancomycin for "cellulitis". In the interim, it was found that her knee actually was exquisitely tender to palpation and she underwent aspiration of the knee roughly 24 hours after she had been started on vancomycin. Cellcount and differential from the knee revealed 147,697 white blood cells with 67% neutrophils. Intracellular monosodium urate crystals were seen and the patient's uric  acid level was elevated at 12, consistent with her prior history of gout. Sedimentation rate was also elevated at 74. Cultures ultimately yielded Group B streptococci R to clindamycin. He was in renal failure and I switched her to Daptomycin but had high CPK after initiation and was switched back to vancomycin after it was found that the group b strep was Clinda R. She NEVER had  washout of joint or exchange arthroplasty wish I had pushed for She DID have serial aspirations of the joint. She finished the IV vancomycin and then within 2 weeks of stopping had recurrence of knee pain and felt like at "ton of bricks had fallen on her leg" Within 2 weeks she was admitted to the hospital with fevers, malaise, and atrial fibrillation and RVR. She had fevers and increasing knee pain. Her blood cultures were drawn adn failed to grown any organism. She was started on IV vancomycin. Dr. Esaw Dace from orthopedic surgery saw the patient. The knee was not aspirated during that visit. Her knee pain improved. We discussed options of removing the facet joint but this appeared to be due more to procedure to Dr. Esaw Dace for the patient. Therefore she was referred to [facility] where she has since seen a orthopedic surgeon there. At that seen the records for my for the patient tells me that she was offered only a below an above-the-knee amputation. She claims that the idea of removing the prosthetic material and placing antibiotic spacer was not offered an this was not feasible. She states that physician had offered the theoretical idea of a a 2 staged placement but felt that the patient would not survive 2 surgeries. Since I last saw hospital her knee pain has resolved she now has lower extremity edema in bilateral legs which bothers her she traverses partly to not having her legs elevated much at night she is without fever nausea vomiting or systemic symptoms. Review of Systems  Constitutional: Negative for fever, chills, diaphoresis, activity change,  appetite change, fatigue and unexpected weight change.  HENT: Negative for congestion, sore throat, rhinorrhea, sneezing, trouble swallowing and sinus pressure.   Eyes: Negative for photophobia and visual disturbance.  Respiratory: Negative for cough, chest tightness, shortness of breath, wheezing and stridor.   Cardiovascular: Negative for chest pain,  palpitations and leg swelling.  Gastrointestinal: Negative for nausea, vomiting, abdominal pain, diarrhea, constipation, blood in stool, abdominal distention and anal bleeding.  Genitourinary: Negative for dysuria, hematuria, flank pain and difficulty urinating.  Musculoskeletal: Negative for myalgias, back pain, joint swelling, arthralgias and gait problem.  Skin: Negative for color change, pallor, rash and wound.  Neurological: Negative for dizziness, tremors, weakness and light-headedness.  Hematological: Negative for adenopathy. Does not bruise/bleed easily.  Psychiatric/Behavioral: Negative for behavioral problems, confusion, sleep disturbance, dysphoric mood, decreased concentration and agitation.       Objective:   Physical Exam  Constitutional: She is oriented to person, place, and time. She appears well-developed and well-nourished. No distress.  HENT:  Head: Normocephalic and atraumatic.  Mouth/Throat: Oropharynx is clear and moist. No oropharyngeal exudate.  Eyes: Conjunctivae and EOM are normal. Pupils are equal, round, and reactive to light. No scleral icterus.  Neck: Normal range of motion. Neck supple. No JVD present.  Cardiovascular: Normal rate, regular rhythm and normal heart sounds.  Exam reveals no gallop and no friction rub.   No murmur heard. Pulmonary/Chest: Effort normal and breath sounds normal. No respiratory distress. She has no wheezes. She has no rales. She exhibits no tenderness.  Abdominal: She exhibits no distension and no mass. There is no tenderness. There is no rebound and no guarding.  Musculoskeletal: She exhibits edema. She exhibits no tenderness.       Legs:      Knee incision is CDI  Lymphadenopathy:    She has no cervical adenopathy.  Neurological: She is alert and oriented to person, place, and time. She has normal reflexes. She exhibits normal muscle tone. Coordination normal.  Skin: Skin is warm and dry. She is not diaphoretic. No erythema. No  pallor.       picc line is clea   She has 3+ pretibial edema bilaterally  Psychiatric: She has a normal mood and affect. Her behavior is normal. Judgment and thought content normal.          Assessment & Plan:

## 2011-08-26 NOTE — Assessment & Plan Note (Signed)
Lower extremity edema is worse. May need uptitration of lasix. Will defer to Dr. [name]

## 2011-08-26 NOTE — Assessment & Plan Note (Signed)
See discussion. Will try to suppress the GBS with levaquin

## 2011-08-27 LAB — SEDIMENTATION RATE: Sed Rate: 1 mm/hr (ref 0–22)

## 2011-09-01 NOTE — Consult Note (Signed)
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  HISTORY OF PRESENT ILLNESS:  [name] is a 60 year old African American female known to me from when I saw her in June over at [facility].  At that time, she had presented with fevers and left knee pain and been admitted to [facility].  She had redness involving her left knee and leg and was started on antibiotics and admitted.  She ultimately underwent aspiration of the knee which had 147,697 white blood cells with 67% neutrophils.  There were gout crystals seen, but her culture actually also did grow a group B strep to cocci from the synovial fluid.  The group B strep was resistant to clindamycin and sensitive to ampicillin, Levaquin, penicillin, and vancomycin.  At that time, I advocated having her knee formally washed out by Orthopedic Surgery, but this was ultimately not done.  I recall that the reasons it was not pursued included that the patient had multiple medical problems and I was concerned about her risk in the operating room.  Additionally, she had seemed to improve with antibiotics.  She may have had some other serial aspirations of the knee, but I do not see documentation in the electronic record that she has had this done.  Additionally, there was obvious explanation that she had gout, although it clearly does not explain the entirety of her symptoms and she did grow an organism from her culture.  In any case, we gave her vancomycin and plans for 6 weeks of therapy and follow  up in my clinic.  Ultimately, she came off the vancomycin approximately 2 weeks ago.  Since then, she has experienced worsening knee pain.  She was seen by her primary care physician, Dr. [name] on Monday and he placed her on doxycycline to try and suppress her knee infection.  When I saw her on Wednesday of this week, she was continuing to have knee pain.  It felt like a ton of bricks was on it. She had not yet had fevers at that point in time.  When I saw him in the clinic, I checked her sedimentation rate and it was 135.  I attempted to prescribe her Zyvox due to her multiple allergies, but the co-pay for this medicine was $600 even with Medicare.  She therefore did not fill the medicine and had called my office to inquire about an alternative. I have been endeavoring to find out what her true FLUOROQUINOLONE allergy was and to make sure that she was going to see Dr. Jerl Santos. The patient, however, was unable to see Dr. Jerl Santos and had not been able to make the co-pay.  In the interim, she has deteriorated with feelings of  malaise, fevers, chills, and abdominal discomfort.  She also developed shortness of breath and was admitted to [facility] earlier this morning on the 17th.  In the ER, she was found to be in atrial flutter with a rate of 130 and also with some evidence of heart failure.  She also was febrile as well and had erythema involving her left knee which was also tender and warm.  She was admitted to the Hospitalist Service, placed on a Cardizem drip and started on vancomycin.  I have been asked to see the patient and do workup her fevers and concerned about which is a persistent left septic knee and now potentially septicemia, although we do not have cultures back yet.  PAST MEDICAL HISTORY: 1. Obesity hypoventilation syndrome. 2. Congestive heart failure. 3. Atrial flutter with rapid ventricular response. 4. Diabetes mellitus. 5. Tobacco use. 6. Diastolic heart  failure. 7. Chronic renal insufficiency with acute renal insults during last     hospitalization. 8. Cerebrovascular accident. 9. Prosthetic knee infection in 2001, status post 2-stage replacement     with treatment with Tequin and rifampin.  No organisms were     isolated from the wound at that time, but MRSA was isolated from     wound in 2007. 10.Prosthetic knee infection on the left side with group B strep in     June 2012 as described above.  PAST SURGICAL HISTORY:  Hysterectomy and left arm surgery.  FAMILY HISTORY:  Noncontributory.  SOCIAL HISTORY:  The patient lives with her husband for 41 years. Denies tobacco use now.  No alcohol or recreational drug use.  ALLERGIES:  The patient is allergic to: 1. ERYTHROMYCIN. 2. CODEINE. 3. PENICILLIN which caused hives. 4. FLUOXETINE. 5. Ofloxacin is listed, but she does not recall the allergy. 6. Of note, she has been intolerant to CIPROFLOXACIN, but does not     recall the intolerance.  She has tolerated levofloxacin per one of     the old discharge summary from 2006. 7. She is also allergic to ATENOLOL. 8. CELECOXIB. 9. SULFA which causes hives. 10.Cephalosporins which also cause a rash.  REVIEW OF SYSTEMS:  As described in the history of present illness, otherwise 12-point review of systems is negative.  PHYSICAL EXAMINATION:  VITAL SIGNS:  Temperature maximum since arrival in the ICU is 98.3 but she has only been here for few hours, blood pressure currently is 109/88, pulse is 89, respirations 23, and pulse ox 100% on 2 liters via nasal cannula. GENERAL:  Quite pleasant lady, slightly anxious. HEENT:  Normocephalic and thick neck.  Pupils are equal, round, and reactive to light.  Sclerae are icteric.  Oropharynx is clear. NECK:  Supple. CARDIOVASCULAR:  Irregularly irregular rhythm.  No murmurs, gallops, or rubs heard. LUNGS:  Relatively clear to auscultation. ABDOMEN:  Soft and nondistended. EXTREMITIES:  Her left  knee is quite warm to touch and it seems actually worse than when I saw her 2 days ago.  There is tenderness as well. NEUROLOGICAL:  Nonfocal.  LABORATORY DATA:  Of note, the sed rate I checked in my clinic was 132. Here, in the ICU, her other labs include a CBC which shows white count of 13.7, hemoglobin 10.3, platelets 255, and ANC of 13.2.  Troponin was 0.01.  PT was 40.6 with an INR of 4.13.  Lactic acid was 1.4.  Pro-BNP was 3063.  Metabolic panel:  Sodium 138, potassium 3.7, chloride 102, bicarb 25, BUN and creatinine 22 and 1.61, and  glucose 199.  Urinalysis was negative.  Procalcitonin was 0.27.  MRSA/PCR screen was negative.  IMPRESSION/RECOMMENDATIONS:  This is a 60 year old African American lady with recurrent prosthetic knee infections, most recently with group B strep and left prosthetic knee isolated in June who received more than 6 weeks of vancomycin, but never did have a formal washout of the knee, now with worsening of knee pain after coming off antibiotics 2 weeks ago, fevers, and chills and now admitted at the ICU with atrial fibrillation and rapid ventricle response.  Persistent right prosthetic knee infection:  I will talk to Dr. Jerl Santos or one of his partners on call.  This patient in my mind clearly needs a washout of the knee to effect cure.  Given her multiple allergies, I really think that the most prudent thing if it is medically feasible and safe from a surgical standpoint would be to washout her knee, obtain cultures in the OR, remove the prosthesis, and either reimplant a new prosthetic knee or simply leave an antibiotic spacer in place. Certainly, at minimum, she needs a washout of the knee followed by oral antibiotics, but again the oral antibiotics choice will be complicated in her due to her multiple allergies.  If I was going to try to suppress her down the road, I would try and do fluoroquinolone and hope that she would not have an allergic  reaction to it given that she apparently had intolerance to ciprofloxacin in the past.  But other oral agents are off the table.  No beta-lactams are available.  The organism was resistant to clindamycin and Zyvox would be not a viable in long term.  My option is certainly financially going to be a burden as well.  I am also concerned that the patient may be bacteremic given her clinical presentation and we will follow up her blood cultures.  Thank you for this infectious disease consultation.  I spent more than 45 minutes on the patient including 50% of the time counseling the patient and coordinating care.     Acey Lav, MD     CV/MEDQ  D:  07/26/2011  T:  07/26/2011  Job:  [id number]  cc:   Minerva Areola L. [name], M.D. Lubertha Basque Jerl Santos, M.D.  Electronically Signed by Paulette Blanch DAM MD on 09/01/2011 10:02:45 PM

## 2011-09-10 ENCOUNTER — Telehealth: Payer: Self-pay | Admitting: *Deleted

## 2011-09-10 NOTE — Telephone Encounter (Signed)
After speaking with the provider told the nurse to pull the picc and that we will call in her oral antibiotic the her pharmacy. She uses [facility].

## 2011-09-10 NOTE — Telephone Encounter (Signed)
Patient's home health nurse called Ms Christella Noa to see if the Picc line could be pulled due to end of scheduled meds. Nurse advised that PCP says yes but she wanted to check with ID Provider first. Told her would check with provider and give her a call back.

## 2011-09-11 ENCOUNTER — Other Ambulatory Visit: Payer: Self-pay | Admitting: Licensed Clinical Social Worker

## 2011-09-11 DIAGNOSIS — A491 Streptococcal infection, unspecified site: Secondary | ICD-10-CM

## 2011-09-11 MED ORDER — LEVOFLOXACIN 500 MG PO TABS: 500.0000 mg | ORAL_TABLET | Freq: Every day | ORAL | Status: DC

## 2011-09-25 ENCOUNTER — Ambulatory Visit (INDEPENDENT_AMBULATORY_CARE_PROVIDER_SITE_OTHER): Payer: Medicare HMO | Admitting: Infectious Disease

## 2011-09-25 ENCOUNTER — Encounter: Payer: Self-pay | Admitting: Infectious Disease

## 2011-09-25 VITALS — BP 102/72 | HR 71 | Temp 98.0°F

## 2011-09-25 DIAGNOSIS — A491 Streptococcal infection, unspecified site: Secondary | ICD-10-CM

## 2011-09-25 DIAGNOSIS — T8450XA Infection and inflammatory reaction due to unspecified internal joint prosthesis, initial encounter: Secondary | ICD-10-CM

## 2011-09-25 DIAGNOSIS — B373 Candidiasis of vulva and vagina: Secondary | ICD-10-CM | POA: Insufficient documentation

## 2011-09-25 DIAGNOSIS — B951 Streptococcus, group B, as the cause of diseases classified elsewhere: Secondary | ICD-10-CM

## 2011-09-25 LAB — BASIC METABOLIC PANEL WITH GFR
BUN: 19 mg/dL (ref 6–23)
CO2: 23 mEq/L (ref 19–32)
Chloride: 107 mEq/L (ref 96–112)
GFR, Est African American: 41 mL/min — ABNORMAL LOW (ref >90)
Glucose, Bld: 146 mg/dL — ABNORMAL HIGH (ref 70–99)
Potassium: 4.4 mEq/L (ref 3.5–5.3)
Sodium: 141 mEq/L (ref 135–145)

## 2011-09-25 LAB — CBC WITH DIFFERENTIAL/PLATELET
Eosinophils Relative: 1 % (ref 0–5)
HCT: 34.1 % — ABNORMAL LOW (ref 36.0–46.0)
Hemoglobin: 10.6 g/dL — ABNORMAL LOW (ref 12.0–15.0)
Lymphocytes Relative: 8 % — ABNORMAL LOW (ref 12–46)
Lymphs Abs: 0.6 10*3/uL — ABNORMAL LOW (ref 0.7–4.0)
MCV: 91.4 fL (ref 78.0–100.0)
Monocytes Absolute: 0.4 10*3/uL (ref 0.1–1.0)
Monocytes Relative: 5 % (ref 3–12)
Neutro Abs: 6.7 10*3/uL (ref 1.7–7.7)
RBC: 3.73 MIL/uL — ABNORMAL LOW (ref 3.87–5.11)
RDW: 18.3 % — ABNORMAL HIGH (ref 11.5–15.5)
WBC: 7.8 10*3/uL (ref 4.0–10.5)

## 2011-09-25 MED ORDER — FLUCONAZOLE 150 MG PO TABS: 150.0000 mg | ORAL_TABLET | Freq: Once | ORAL | Status: AC

## 2011-09-25 NOTE — Assessment & Plan Note (Signed)
Continue levaquin, check esr and crp

## 2011-09-25 NOTE — Progress Notes (Signed)
Subjective:    Patient ID: [name], female    DOB: 23-Mar-1951, 60 y.o.   MRN: [id number]  HPI [name] is a 60 year old African American female with a past medical history significant for multiple medical problems including atrial fibrillation with rapid ventricular  response, congestive heart failure, obesity hypoventilation syndrome,  diabetes mellitus, tobacco use, and left-sided knee replacement WITH HISTORY OF PROSTHETIC KNEE INFECTION IN 2001, SP TEQUIN AND RIFAMPIN WITH TWO STAGED JOINT REPLACEMENT, AND HISTORY OF MRSA FROM KNEE WOUND IN 2007. She was admitted on the 24th of June with 2 days of worsening left lower extremity pain. She was unable to support much weight on that side. Initially when she was seen in the emergency apartment, she was found to be febrile to 102 with elevated white blood cell count. She was thought to have redness in her left lower extremity with concern for possible cellulitis. She was known to have gout as well. She was  feeling ill and was in atrial flutter with a rapid response in the 120s. She was brought in to the critical care for further management. On admission, she was started empirically on vancomycin for "cellulitis". In the interim, it was found that her knee actually was exquisitely tender to palpation and she underwent aspiration of the knee roughly 24 hours after she had been started on vancomycin. Cellcount and differential from the knee revealed 147,697 white blood cells with 67% neutrophils. Intracellular monosodium urate crystals were seen and the patient's uric  acid level was elevated at 12, consistent with her prior history of gout. Sedimentation rate was also elevated at 74. Cultures ultimately yielded Group B streptococci R to clindamycin. He was in renal failure and I switched her to Daptomycin but had high CPK after initiation and was switched back to vancomycin after it was found that the group b strep was Clinda R. She NEVER had  washout of joint or exchange arthroplasty wish I had pushed for She DID have serial aspirations of the joint. She finished the IV vancomycin and then within 2 weeks of stopping had recurrence of knee pain and felt like at "ton of bricks had fallen on her leg" Within 2 weeks she was admitted to the hospital with fevers, malaise, and atrial fibrillation and RVR. She had fevers and increasing knee pain. Her blood cultures were drawn adn failed to grown any organism. She was started on IV vancomycin. Dr. Esaw Dace from orthopedic surgery saw the patient. The knee was not aspirated during that visit. Her knee pain improved. We discussed options of removing the facet joint but this appeared to be due more to procedure to Dr. Esaw Dace for the patient. Therefore she was referred to [facility] where she has since seen a orthopedic surgeon there. At that seen the records for my for the patient tells me that she was offered only a below an above-the-knee amputation. She claims that the idea of removing the prosthetic material and placing antibiotic spacer was not offered an this was not feasible. She states that physician had offered the theoretical idea of a a 2 staged placement but felt that the patient would not survive 2 surgeries. Since I last saw hospital her knee pain has resolved she now has lower extremity edema in bilateral legs which bothers her she traverses partly to not having her legs elevated much at night she is without fever nausea vomiting or systemic symptoms.    Review of Systems  Constitutional: Negative for fever, chills, diaphoresis,  activity change, appetite change, fatigue and unexpected weight change.  HENT: Negative for congestion, sore throat, rhinorrhea, sneezing, trouble swallowing and sinus pressure.   Eyes: Negative for photophobia and visual disturbance.  Respiratory: Negative for cough, chest tightness, shortness of breath, wheezing and stridor.   Cardiovascular: Positive for leg  swelling. Negative for chest pain and palpitations.  Gastrointestinal: Negative for nausea, vomiting, abdominal pain, diarrhea, constipation, blood in stool, abdominal distention and anal bleeding.  Genitourinary: Negative for dysuria, hematuria, flank pain and difficulty urinating.  Musculoskeletal: Negative for myalgias, back pain, joint swelling, arthralgias and gait problem.  Skin: Negative for color change, pallor, rash and wound.  Neurological: Negative for dizziness, tremors, weakness and light-headedness.  Hematological: Negative for adenopathy. Does not bruise/bleed easily.  Psychiatric/Behavioral: Negative for behavioral problems, confusion, sleep disturbance, dysphoric mood, decreased concentration and agitation.       Objective:   Physical Exam  Constitutional: She is oriented to person, place, and time. She appears well-developed and well-nourished. No distress.  HENT:  Head: Normocephalic and atraumatic.  Mouth/Throat: Oropharynx is clear and moist. No oropharyngeal exudate.  Eyes: Conjunctivae and EOM are normal. Pupils are equal, round, and reactive to light. No scleral icterus.  Neck: Normal range of motion. Neck supple. No JVD present.  Cardiovascular: Normal rate, regular rhythm and normal heart sounds.  Exam reveals no gallop and no friction rub.   No murmur heard. Pulmonary/Chest: Effort normal and breath sounds normal. No respiratory distress. She has no wheezes. She has no rales. She exhibits no tenderness.  Abdominal: She exhibits no distension and no mass. There is no tenderness. There is no rebound and no guarding.  Musculoskeletal: She exhibits no edema and no tenderness.  Lymphadenopathy:    She has no cervical adenopathy.  Neurological: She is alert and oriented to person, place, and time. She has normal reflexes. She exhibits normal muscle tone. Coordination normal.  Skin: Skin is warm and dry. She is not diaphoretic. No pallor.     Psychiatric: She has a  normal mood and affect. Her behavior is normal. Judgment and thought content normal.          Assessment & Plan:  Prosthetic joint infection Continue levaquin, check esr and crp  Group B streptococcal infection Continue levaquin  Yeast infection involving the vagina and surrounding area Diflucan x 1

## 2011-09-25 NOTE — Assessment & Plan Note (Signed)
Continue levaquin

## 2011-09-25 NOTE — Assessment & Plan Note (Signed)
-  Diflucan x1

## 2011-11-05 ENCOUNTER — Encounter: Payer: Self-pay | Admitting: Internal Medicine

## 2011-11-06 ENCOUNTER — Ambulatory Visit (INDEPENDENT_AMBULATORY_CARE_PROVIDER_SITE_OTHER): Payer: Medicare HMO | Admitting: Internal Medicine

## 2011-11-06 ENCOUNTER — Encounter: Payer: Self-pay | Admitting: Internal Medicine

## 2011-11-06 VITALS — BP 126/68 | HR 57 | Ht 63.0 in | Wt 296.4 lb

## 2011-11-06 DIAGNOSIS — G4733 Obstructive sleep apnea (adult) (pediatric): Secondary | ICD-10-CM

## 2011-11-06 DIAGNOSIS — I4891 Unspecified atrial fibrillation: Secondary | ICD-10-CM

## 2011-11-06 DIAGNOSIS — J45909 Unspecified asthma, uncomplicated: Secondary | ICD-10-CM

## 2011-11-06 NOTE — Patient Instructions (Addendum)
Order [facility]- need financial counselors for [employer]/ CPAP 7 cwp, and [facility]/ home oxygen  2 L/M for sleep, to work with her to see if she can get bills straight with both. I would like her back using CPAP with oxygen if possible.

## 2011-11-06 NOTE — Progress Notes (Signed)
Patient ID: [name], female    DOB: [birth year], 60 y.o.   MRN: [id number]  HPI 05/02/11- 36 yoF former smoker, followed for sleep apnea, complicated by obesity/ hypoventilation syndrome, AFib, HBP, hx CHF, hx CVA. Last here Decmber 15, 2011. Note reviewed. Since last here Dr [name] ordered oxygen through [facility] and she is using that instead of CPAP which is from [place]. She wasn't shown how to join O2 to CPAP. Was using CPAP every night before that. Now having to sleep up in chair, wakes gasping.  Asthma control worse in last 2 days- felt weather change - using inhalers more. Notes some pain right shoulder area with deep breath. Saw dermatologist for stasis changes in legs.  11/06/11- 55 yoF former smoker, followed for sleep apnea, complicated by obesity/ hypoventilation syndrome, AFib, HBP, hx CHF, hx CVA. She had a long hospitalization during the summer. Continues home oxygen at 2 L, especially for sleep. While she was in the hospital, [employer] stopped her CPAP and reported her to the credit bureau for nonpayment, so she no longer has CPAP. She continues to sleep in a recliner chair. Her legs are so heavy that when she lies in bed, they ache but she is unable to lift them to turn over.  Review of Systems Constitutional:   No-   weight loss, night sweats, fevers, chills, fatigue,+ lassitude. HEENT:   No-  headaches, difficulty swallowing, tooth/dental problems, sore throat,       No-  sneezing, itching, ear ache, nasal congestion, post nasal drip,  CV:  No-  chest pain, orthopnea, PND, +swelling in lower extremities, No- anasarca,                                  dizziness, palpitations Resp: No- acute  shortness of breath with exertion or at rest.              No-   productive cough,  No non-productive cough,  No- coughing up of blood.              No-   change in color of mucus.  No- wheezing.   Skin: No-   rash or lesions. GI:  No-   heartburn, indigestion, abdominal pain, nausea,  vomiting, diarrhea,                 change in bowel habits, loss of appetite GU: No-   dysuria, change in color of urine, no urgency or frequency.  No- flank pain. MS:  No-   joint pain or swelling.  No- decreased range of motion.  No- back pain. Neuro-     nothing unusual Psych:  No- change in mood or affect. + depression or anxiety.  No memory loss.      Objective:   Physical Exam General- Alert, Oriented, Affect-appropriate, Distress- none acute, obese Skin- rash-none, lesions- none, excoriation- none Lymphadenopathy- none Head- atraumatic            Eyes- Gross vision intact, PERRLA, conjunctivae clear secretions            Ears- Hearing, canals-normal            Nose- Clear, no-Septal dev, mucus, polyps, erosion, perforation             Throat- Mallampati II , mucosa clear , drainage- none, tonsils- atrophic Neck- flexible , trachea midline, no stridor , thyroid nl, carotid no bruit  Chest - symmetrical excursion , unlabored           Heart/CV- RRR-pulse seems regular today , no murmur , no gallop  , no rub, nl s1 s2                           - JVD- none , edema- none, stasis changes- none, varices- none           Lung- clear to P&A-shallow consistent with body habitus, wheeze- none, cough- none , dullness-none, rub- none           Chest wall-  Abd- tender-no, distended-no, bowel sounds-present, HSM- no Br/ Gen/ Rectal- Not done, not indicated Extrem-  severe dependent peripheral edema and chronic stasis dermatitis Neuro- grossly intact to observation

## 2011-11-07 NOTE — Assessment & Plan Note (Signed)
Either very well-controlled, or in sinus rhythm today by exam.

## 2011-11-07 NOTE — Assessment & Plan Note (Signed)
Noncompliant with CPAP. [name] is not providing support due to nonpayment. I'm not sure the patient is able to understand the billing process. She continues home oxygen and it would be much better if she could get both CPAP and home oxygen through a single supplier.

## 2011-11-07 NOTE — Assessment & Plan Note (Signed)
Currently controlled but she will need a nebulizer machine. We will order replacement as discussed

## 2011-11-22 ENCOUNTER — Emergency Department (HOSPITAL_COMMUNITY): Payer: Medicare HMO

## 2011-11-22 ENCOUNTER — Other Ambulatory Visit: Payer: Self-pay

## 2011-11-22 ENCOUNTER — Encounter (HOSPITAL_COMMUNITY): Payer: Self-pay | Admitting: Emergency Medicine

## 2011-11-22 ENCOUNTER — Inpatient Hospital Stay (HOSPITAL_COMMUNITY)
Admission: EM | Admit: 2011-11-22 | Discharge: 2011-11-29 | DRG: 388 | Disposition: A | Discharge status: Home / Self Care | Payer: Medicare HMO | Attending: Internal Medicine | Admitting: Internal Medicine

## 2011-11-22 DIAGNOSIS — J45909 Unspecified asthma, uncomplicated: Secondary | ICD-10-CM | POA: Diagnosis present

## 2011-11-22 DIAGNOSIS — E1129 Type 2 diabetes mellitus with other diabetic kidney complication: Secondary | ICD-10-CM

## 2011-11-22 DIAGNOSIS — T8450XA Infection and inflammatory reaction due to unspecified internal joint prosthesis, initial encounter: Secondary | ICD-10-CM | POA: Diagnosis present

## 2011-11-22 DIAGNOSIS — E876 Hypokalemia: Secondary | ICD-10-CM | POA: Diagnosis present

## 2011-11-22 DIAGNOSIS — K56609 Unspecified intestinal obstruction, unspecified as to partial versus complete obstruction: Secondary | ICD-10-CM

## 2011-11-22 DIAGNOSIS — Z7901 Long term (current) use of anticoagulants: Secondary | ICD-10-CM

## 2011-11-22 DIAGNOSIS — B370 Candidal stomatitis: Secondary | ICD-10-CM | POA: Diagnosis present

## 2011-11-22 DIAGNOSIS — I5041 Acute combined systolic (congestive) and diastolic (congestive) heart failure: Secondary | ICD-10-CM | POA: Diagnosis not present

## 2011-11-22 DIAGNOSIS — I10 Essential (primary) hypertension: Secondary | ICD-10-CM | POA: Diagnosis present

## 2011-11-22 DIAGNOSIS — E662 Morbid (severe) obesity with alveolar hypoventilation: Secondary | ICD-10-CM | POA: Diagnosis present

## 2011-11-22 DIAGNOSIS — R310 Gross hematuria: Secondary | ICD-10-CM | POA: Diagnosis not present

## 2011-11-22 DIAGNOSIS — G4733 Obstructive sleep apnea (adult) (pediatric): Secondary | ICD-10-CM | POA: Diagnosis present

## 2011-11-22 DIAGNOSIS — J42 Unspecified chronic bronchitis: Secondary | ICD-10-CM | POA: Diagnosis present

## 2011-11-22 DIAGNOSIS — N289 Disorder of kidney and ureter, unspecified: Secondary | ICD-10-CM | POA: Diagnosis present

## 2011-11-22 DIAGNOSIS — E119 Type 2 diabetes mellitus without complications: Secondary | ICD-10-CM | POA: Diagnosis present

## 2011-11-22 DIAGNOSIS — I509 Heart failure, unspecified: Secondary | ICD-10-CM | POA: Diagnosis not present

## 2011-11-22 DIAGNOSIS — I4891 Unspecified atrial fibrillation: Secondary | ICD-10-CM | POA: Diagnosis present

## 2011-11-22 HISTORY — DX: Adverse effect of unspecified anesthetic, initial encounter: T41.45XA

## 2011-11-22 HISTORY — DX: Other complications of anesthesia, initial encounter: T88.59XA

## 2011-11-22 LAB — URINALYSIS, ROUTINE W REFLEX MICROSCOPIC
Bilirubin Urine: NEGATIVE
Glucose, UA: 100 mg/dL — AB
Ketones, ur: NEGATIVE mg/dL
Protein, ur: >300 mg/dL — AB
pH: 5.5 (ref 5.0–8.0)

## 2011-11-22 LAB — GLUCOSE, CAPILLARY: Glucose-Capillary: 146 mg/dL — ABNORMAL HIGH (ref 70–99)

## 2011-11-22 LAB — URINE MICROSCOPIC-ADD ON

## 2011-11-22 MED ORDER — SODIUM CHLORIDE 0.9 % IV BOLUS (SEPSIS)
1000.0000 mL | Freq: Once | INTRAVENOUS | Status: AC
  Administered 2011-11-22: 1000 mL via INTRAVENOUS

## 2011-11-22 MED ORDER — ONDANSETRON HCL 4 MG/2ML IJ SOLN
4.0000 mg | Freq: Once | INTRAMUSCULAR | Status: AC
  Administered 2011-11-22: 4 mg via INTRAVENOUS
  Filled 2011-11-22: qty 2

## 2011-11-22 MED ORDER — HYDROMORPHONE HCL PF 1 MG/ML IJ SOLN
1.0000 mg | Freq: Once | INTRAMUSCULAR | Status: AC
  Administered 2011-11-22: 1 mg via INTRAVENOUS
  Filled 2011-11-22: qty 1

## 2011-11-22 NOTE — ED Provider Notes (Signed)
History     CSN: [id number] Arrival date & time: 11/22/2011  7:34 PM   First MD Initiated Contact with Patient 11/22/11 2138      Chief Complaint  Patient presents with  . Abdominal Pain  . Nausea    (Consider location/radiation/quality/duration/timing/severity/associated sxs/prior treatment) Patient is a 60 y.o. female presenting with abdominal pain. The history is provided by the patient. No language interpreter was used.  Abdominal Pain The primary symptoms of the illness include abdominal pain, shortness of breath, nausea and vomiting. The primary symptoms of the illness do not include fever, diarrhea, hematemesis, dysuria, vaginal discharge or vaginal bleeding. The current episode started 13 to 24 hours ago. The onset of the illness was gradual. The problem has been gradually worsening.  The illness is associated with recent antibiotic use, laxative use and awakening from sleep. The patient states that she believes she is currently not pregnant. The patient has had a change in bowel habit. Additional symptoms associated with the illness include chills, anorexia and constipation. Symptoms associated with the illness do not include diaphoresis, heartburn, urgency, hematuria, frequency or back pain.  Reports abdominal pain  that started around 12 noon yesterday.  The pain was 8/10 yesterday and 10/10 today and coming in waves.  The pain is mid abdomen and sharp when it come in waves.  States that she has been vomiting as well and has not kept any of her meds down today.  Thinks that she is constipated but she did have a normal looking bm today but it was small.  Tried prune juice and peptobismal with no results.  Also SOB and she is on 2Lnc at night.  Recent dx last month with afib but nsr today.  On levoquin for a septic knee from 2 months ago.    Past Medical History  Diagnosis Date  . Asthma   . Bronchitis   . Allergic rhinitis   . Diabetes mellitus   . Stroke   . HTN (hypertension)    . OSA (obstructive sleep apnea)   . DJD (degenerative joint disease)   . Kidney stones   . Depression   . Hyperlipidemia     Past Surgical History  Procedure Date  . Appendectomy   . Cholecystectomy   . Total knee arthroplasty   . Total abdominal hysterectomy   . Back surgery   . Cystectomy     left hand    Family History  Problem Relation Age of Onset  . Heart attack Father   . Asthma Father   . Heart disease Paternal Uncle   . Rectal cancer Paternal Aunt   . Other Mother     mva    History  Substance Use Topics  . Smoking status: Former Smoker    Types: Cigarettes  . Smokeless tobacco: Not on file   Comment: smoked for 1.5 yrs about 2 cigs a day ,only if stressed  . Alcohol Use: No    OB History    Grav Para Term Preterm Abortions TAB SAB Ect Mult Living                  Review of Systems  Constitutional: Positive for chills. Negative for fever and diaphoresis.  Respiratory: Positive for shortness of breath.   Gastrointestinal: Positive for nausea, vomiting, abdominal pain, constipation and anorexia. Negative for heartburn, diarrhea and hematemesis.  Genitourinary: Negative for dysuria, urgency, frequency, hematuria, vaginal bleeding and vaginal discharge.  Musculoskeletal: Negative for back pain.  All other  systems reviewed and are negative.    Allergies  Daptomycin; Morphine and related; Keflex; Celecoxib; Codeine; Fluoxetine hcl; Latex; Ofloxacin; Penicillins; Rofecoxib; and Sulfonamide derivatives  Home Medications   Current Outpatient Rx  Name Route Sig Dispense Refill  . ALBUTEROL SULFATE HFA 108 (90 BASE) MCG/ACT IN AERS Inhalation Inhale 2 puffs into the lungs every 6 (six) hours as needed.      . ALLOPURINOL 100 MG PO TABS Oral Take 1 tablet by mouth daily.    . ASPIRIN 81 MG PO TABS Oral Take 81 mg by mouth daily.      [name] CLOBETASOL PROPIONATE 0.05 % EX OINT  Use as directed on legs    . DIGOXIN 0.125 MG PO TABS Oral Take 1 tablet by mouth  daily.    [name] DILTIAZEM HCL ER BEADS 360 MG PO CP24 Oral Take 360 mg by mouth daily.      [name] FLUTICASONE PROPIONATE  HFA 220 MCG/ACT IN AERO Inhalation Inhale 2 puffs into the lungs 3 (three) times daily.      . FUROSEMIDE 40 MG PO TABS Oral Take 60 mg by mouth daily.      . GNP LANCETS MICRO THIN 33G MISC      . INSULIN ASPART 100 UNIT/ML [place] SOLN Subcutaneous Inject 2 Units into the skin 3 (three) times daily before meals. 2units - 8units sliding scale     . LANTUS 100 UNIT/ML [place] SOLN  10 units at bedtime    . METHOTREXATE 2.5 MG PO TABS Oral Take 10 mg by mouth once a week. Caution:Chemotherapy. Protect from light.     [name] METOLAZONE 2.5 MG PO TABS Oral Take 1 tablet by mouth Daily.    [name] METOPROLOL TARTRATE 50 MG PO TABS Oral Take 1 tablet by mouth 2 (two) times daily.     [name] OLANZAPINE 5 MG PO TABS Oral Take 5 mg by mouth at bedtime.      [name] PROMETHAZINE HCL 12.5 MG PO TABS Oral Take 12.5 mg by mouth every 6 (six) hours as needed.      [name] ROSUVASTATIN CALCIUM 20 MG PO TABS Oral Take 20 mg by mouth daily.      [name] SPIRONOLACTONE 25 MG PO TABS Oral Take 25 mg by mouth daily.      . WARFARIN SODIUM 4 MG PO TABS Oral Take 8 mg by mouth daily.       BP 136/72  Pulse 98  Temp(Src) 98 F (36.7 C) (Oral)  Resp 15  SpO2 100%  Physical Exam  Nursing note and vitals reviewed. Constitutional: She is oriented to person, place, and time. She appears well-developed and well-nourished. She appears distressed.       Morbidly obese  Eyes: Pupils are equal, round, and reactive to light.  Neck: Normal range of motion.  Cardiovascular: Normal rate, normal heart sounds and intact distal pulses.   Pulmonary/Chest: She is in respiratory distress. She has no wheezes. She has no rales.  Abdominal: Soft. She exhibits distension. She exhibits no mass. There is tenderness. There is no rebound and no guarding.  Musculoskeletal: She exhibits edema and tenderness.  Neurological: She is alert and oriented to person,  place, and time.  Skin: Skin is warm. She is diaphoretic.       LE chronic edema with treatment from ID doc on a regular basis    ED Course  Procedures (including critical care time)  Labs Reviewed  GLUCOSE, CAPILLARY - Abnormal; Notable for the following:  Glucose-Capillary 146 (*)    All other components within normal limits  CBC - Abnormal; Notable for the following:    RBC 3.85 (*)    Hemoglobin 10.7 (*)    HCT 34.0 (*)    RDW 18.3 (*)    All other components within normal limits  DIFFERENTIAL - Abnormal; Notable for the following:    Neutrophils Relative 82 (*)    Lymphocytes Relative 10 (*)    All other components within normal limits  COMPREHENSIVE METABOLIC PANEL - Abnormal; Notable for the following:    Glucose, Bld 131 (*)    Creatinine, Ser 1.45 (*)    Albumin 2.7 (*)    Total Bilirubin 0.1 (*)    GFR calc non Af Amer 38 (*)    GFR calc Af Amer 44 (*)    All other components within normal limits  URINALYSIS, ROUTINE W REFLEX MICROSCOPIC - Abnormal; Notable for the following:    APPearance CLOUDY (*)    Glucose, UA 100 (*)    Hgb urine dipstick SMALL (*)    Protein, ur >300 (*)    All other components within normal limits  URINE MICROSCOPIC-ADD ON - Abnormal; Notable for the following:    Squamous Epithelial / LPF FEW (*)    All other components within normal limits  PROTIME-INR - Abnormal; Notable for the following:    Prothrombin Time 32.5 (*)    INR 3.11 (*)    All other components within normal limits  GLUCOSE, CAPILLARY - Abnormal; Notable for the following:    Glucose-Capillary 139 (*)    All other components within normal limits  POCT CBG MONITORING  POCT CBG MONITORING   Ct Abdomen Pelvis W Contrast  11/23/2011  *RADIOLOGY REPORT*  Clinical Data: Abdominal pain  CT ABDOMEN AND PELVIS WITH CONTRAST  Technique:  Multidetector CT imaging of the abdomen and pelvis was performed following the standard protocol during bolus administration of  intravenous contrast.  Contrast: OMNIPAQUE IOHEXOL 300 MG/ML IV SOLN  Comparison: 08/07/2011 ultrasound  Findings: Bibasilar opacities.  Cardiomegaly.  Low attenuation of the liver without focal lesion.  Status post cholecystectomy.  There is mild central intrahepatic and moderate extrahepatic biliary ductal dilatation, with the CBD measuring up to 11 mm however tapering smoothly to the level of the ampulla. Unremarkable spleen, pancreas, left adrenal gland.  There is a 1 cm nodule arising from the medial limb of the right adrenal gland, incompletely characterized.  The right kidney is atrophic with a lobular contour.  There is a 1.5 cm water attenuation lesion within the lower pole.  There is a nonobstructing lower pole stone on the left.  In addition, within the upper/interpolar region of the left kidney, there is a 1.4 cm water attenuation lesion.  No hydronephrosis or hydroureter.  No ureteral calculi.  Sigmoid colon diverticulosis without diverticulitis.  The appendix is normal.  There is dilatation of proximal small bowel loops, with air-fluid levels and decompressed distal small bowel, in keeping with a small bowel obstruction pattern.  There is stranding of the mesenteric fat and a small amount of fluid which collects perihepatic and within the pelvis within the gutters.  Fat containing umbilical hernia with a moderate amount of periumbilical fat stranding is nonspecific.  No free intraperitoneal air.  No lymphadenopathy.  Normal caliber vasculature.  Scattered atherosclerotic calcification of the aorta and its branches.  Thin-walled bladder.  Absent uterus.  No adnexal mass.  Postsurgical changes of L4-S1.  Multilevel degenerative changes.  No acute osseous abnormality.  IMPRESSION: Small bowel obstruction pattern.  Transition point in the midline lower abdomen.  Findings discussed via telephone with Remi Haggard at 12:58 on 11/23/2011.  Status post cholecystectomy.  There is mild central intrahepatic  and moderate extrahepatic biliary ductal dilatation, with the CBD measuring up to 11 mm however tapering smoothly to the level of the ampulla. Correlate with LFTs and ERCP / MRCP if clinically warranted.  Original Report Authenticated By: Waneta Martins, M.D.     1. SBO (small bowel obstruction)   2. Diabetes mellitus       MDM  Patient is being admitted by the hospitalist team 3 Dr. Ashley Royalty for a small bowel obstruction per CT scan. She's been having nausea and vomiting for 24 hours with questionable constipation. She received a liter of fluid in the ER. EKG normal sinus rhythm with a past medical history of atrial fib. She is an insulin-dependent diabetic. Recent history of a septic knee. Patient is on Levaquin for this. Abnormal labs as follows  Labs Reviewed  GLUCOSE, CAPILLARY - Abnormal; Notable for the following:    Glucose-Capillary 146 (*)    All other components within normal limits  CBC - Abnormal; Notable for the following:    RBC 3.85 (*)    Hemoglobin 10.7 (*)    HCT 34.0 (*)    RDW 18.3 (*)    All other components within normal limits  DIFFERENTIAL - Abnormal; Notable for the following:    Neutrophils Relative 82 (*)    Lymphocytes Relative 10 (*)    All other components within normal limits  COMPREHENSIVE METABOLIC PANEL - Abnormal; Notable for the following:    Glucose, Bld 131 (*)    Creatinine, Ser 1.45 (*)    Albumin 2.7 (*)    Total Bilirubin 0.1 (*)    GFR calc non Af Amer 38 (*)    GFR calc Af Amer 44 (*)    All other components within normal limits  URINALYSIS, ROUTINE W REFLEX MICROSCOPIC - Abnormal; Notable for the following:    APPearance CLOUDY (*)    Glucose, UA 100 (*)    Hgb urine dipstick SMALL (*)    Protein, ur >300 (*)    All other components within normal limits  URINE MICROSCOPIC-ADD ON - Abnormal; Notable for the following:    Squamous Epithelial / LPF FEW (*)    All other components within normal limits  PROTIME-INR - Abnormal;  Notable for the following:    Prothrombin Time 32.5 (*)    INR 3.11 (*)    All other components within normal limits  GLUCOSE, CAPILLARY - Abnormal; Notable for the following:    Glucose-Capillary 139 (*)    All other components within normal limits  POCT CBG MONITORING  POCT CBG MONITORING     Date: 11/23/2011  Rate: 68  Rhythm: normal sinus rhythm  QRS Axis: normal  Intervals: normal  ST/T Wave abnormalities: normal  Conduction Disutrbances:none  Narrative Interpretation:   Old EKG Reviewed: unchanged    Jethro Bastos, NP 11/23/11 [phone]

## 2011-11-22 NOTE — ED Notes (Signed)
Pt states she has been having abd pain and nausea since Wednesday  Pt states the pain is sharp and feels like someone is cutting her with a razor blade  The pain is constant and is right in the middle of her abdomen  Pt states she had a bowel movement today but had to strain to have it so she took some prune juice  Pt states she is constipated

## 2011-11-23 ENCOUNTER — Encounter (HOSPITAL_COMMUNITY): Payer: Self-pay | Admitting: Internal Medicine

## 2011-11-23 DIAGNOSIS — K56609 Unspecified intestinal obstruction, unspecified as to partial versus complete obstruction: Secondary | ICD-10-CM | POA: Diagnosis present

## 2011-11-23 DIAGNOSIS — E1129 Type 2 diabetes mellitus with other diabetic kidney complication: Secondary | ICD-10-CM

## 2011-11-23 LAB — PROTIME-INR: INR: 3.11 — ABNORMAL HIGH (ref 0.00–1.49)

## 2011-11-23 LAB — DIFFERENTIAL
Basophils Absolute: 0 10*3/uL (ref 0.0–0.1)
Basophils Relative: 0 % (ref 0–1)
Monocytes Relative: 6 % (ref 3–12)
Neutro Abs: 7.7 10*3/uL (ref 1.7–7.7)
Neutrophils Relative %: 82 % — ABNORMAL HIGH (ref 43–77)

## 2011-11-23 LAB — BASIC METABOLIC PANEL WITH GFR
BUN: 17 mg/dL (ref 6–23)
CO2: 28 meq/L (ref 19–32)
Calcium: 9 mg/dL (ref 8.4–10.5)
Chloride: 107 meq/L (ref 96–112)
Creatinine, Ser: 1.46 mg/dL — ABNORMAL HIGH (ref 0.50–1.10)
GFR calc Af Amer: 44 mL/min — ABNORMAL LOW (ref >90)
GFR calc non Af Amer: 38 mL/min — ABNORMAL LOW (ref >90)
Glucose, Bld: 154 mg/dL — ABNORMAL HIGH (ref 70–99)
Potassium: 4.6 meq/L (ref 3.5–5.1)
Sodium: 140 meq/L (ref 135–145)

## 2011-11-23 LAB — CBC
HCT: 34 % — ABNORMAL LOW (ref 36.0–46.0)
HCT: 34 % — ABNORMAL LOW (ref 36.0–46.0)
Hemoglobin: 10.5 g/dL — ABNORMAL LOW (ref 12.0–15.0)
MCV: 88.1 fL (ref 78.0–100.0)
MCV: 88.3 fL (ref 78.0–100.0)
Platelets: 252 10*3/uL (ref 150–400)
RBC: 3.85 MIL/uL — ABNORMAL LOW (ref 3.87–5.11)
RBC: 3.86 MIL/uL — ABNORMAL LOW (ref 3.87–5.11)
RDW: 18.3 % — ABNORMAL HIGH (ref 11.5–15.5)
WBC: 8.8 10*3/uL (ref 4.0–10.5)
WBC: 9.3 10*3/uL (ref 4.0–10.5)

## 2011-11-23 LAB — HEMOGLOBIN A1C
Hgb A1c MFr Bld: 7 % — ABNORMAL HIGH (ref <5.7)
Mean Plasma Glucose: 154 mg/dL — ABNORMAL HIGH (ref <117)

## 2011-11-23 LAB — COMPREHENSIVE METABOLIC PANEL
ALT: 8 U/L (ref 0–35)
AST: 11 U/L (ref 0–37)
Albumin: 2.7 g/dL — ABNORMAL LOW (ref 3.5–5.2)
Alkaline Phosphatase: 83 U/L (ref 39–117)
BUN: 19 mg/dL (ref 6–23)
Chloride: 106 mEq/L (ref 96–112)
Potassium: 4.4 mEq/L (ref 3.5–5.1)
Sodium: 138 mEq/L (ref 135–145)
Total Bilirubin: 0.1 mg/dL — ABNORMAL LOW (ref 0.3–1.2)
Total Protein: 6.5 g/dL (ref 6.0–8.3)

## 2011-11-23 LAB — GLUCOSE, CAPILLARY
Glucose-Capillary: 108 mg/dL — ABNORMAL HIGH (ref 70–99)
Glucose-Capillary: 125 mg/dL — ABNORMAL HIGH (ref 70–99)
Glucose-Capillary: 127 mg/dL — ABNORMAL HIGH (ref 70–99)
Glucose-Capillary: 139 mg/dL — ABNORMAL HIGH (ref 70–99)
Glucose-Capillary: 149 mg/dL — ABNORMAL HIGH (ref 70–99)
Glucose-Capillary: 159 mg/dL — ABNORMAL HIGH (ref 70–99)

## 2011-11-23 LAB — MRSA PCR SCREENING: MRSA by PCR: NEGATIVE

## 2011-11-23 MED ORDER — INSULIN ASPART 100 UNIT/ML ~~LOC~~ SOLN
0.0000 [IU] | SUBCUTANEOUS | Status: DC
  Administered 2011-11-23: 3 [IU] via SUBCUTANEOUS
  Administered 2011-11-23: 4 [IU] via SUBCUTANEOUS
  Administered 2011-11-23 – 2011-11-25 (×6): 3 [IU] via SUBCUTANEOUS
  Administered 2011-11-25: 4 [IU] via SUBCUTANEOUS
  Administered 2011-11-25: 3 [IU] via SUBCUTANEOUS
  Administered 2011-11-26 (×2): 4 [IU] via SUBCUTANEOUS
  Administered 2011-11-26 – 2011-11-27 (×3): 3 [IU] via SUBCUTANEOUS
  Administered 2011-11-27 (×3): 4 [IU] via SUBCUTANEOUS
  Administered 2011-11-28: 7 [IU] via SUBCUTANEOUS
  Administered 2011-11-28 (×2): 3 [IU] via SUBCUTANEOUS
  Administered 2011-11-28: 4 [IU] via SUBCUTANEOUS
  Administered 2011-11-28: 7 [IU] via SUBCUTANEOUS
  Administered 2011-11-28: 3 [IU] via SUBCUTANEOUS
  Administered 2011-11-29: 4 [IU] via SUBCUTANEOUS
  Administered 2011-11-29: 3 [IU] via SUBCUTANEOUS
  Administered 2011-11-29 (×3): 4 [IU] via SUBCUTANEOUS
  Filled 2011-11-23: qty 3

## 2011-11-23 MED ORDER — METOPROLOL TARTRATE 50 MG PO TABS
50.0000 mg | ORAL_TABLET | Freq: Two times a day (BID) | ORAL | Status: DC
  Administered 2011-11-23 – 2011-11-29 (×9): 50 mg via ORAL
  Filled 2011-11-23 (×16): qty 1

## 2011-11-23 MED ORDER — ONDANSETRON HCL 4 MG/2ML IJ SOLN: 4.0000 mg | Freq: Four times a day (QID) | INTRAMUSCULAR | Status: DC | PRN

## 2011-11-23 MED ORDER — FLUTICASONE PROPIONATE HFA 220 MCG/ACT IN AERO
2.0000 | INHALATION_SPRAY | Freq: Three times a day (TID) | RESPIRATORY_TRACT | Status: DC
  Administered 2011-11-23 – 2011-11-29 (×18): 2 via RESPIRATORY_TRACT
  Filled 2011-11-23: qty 12

## 2011-11-23 MED ORDER — IOHEXOL 300 MG/ML  SOLN
100.0000 mL | Freq: Once | INTRAMUSCULAR | Status: AC | PRN
  Administered 2011-11-23: 100 mL via INTRAVENOUS

## 2011-11-23 MED ORDER — ONDANSETRON HCL 4 MG/2ML IJ SOLN
4.0000 mg | Freq: Once | INTRAMUSCULAR | Status: AC
  Administered 2011-11-23: 4 mg via INTRAVENOUS
  Filled 2011-11-23: qty 2

## 2011-11-23 MED ORDER — ALBUTEROL SULFATE HFA 108 (90 BASE) MCG/ACT IN AERS
2.0000 | INHALATION_SPRAY | RESPIRATORY_TRACT | Status: DC | PRN
  Administered 2011-11-24: 2 via RESPIRATORY_TRACT
  Filled 2011-11-23: qty 6.7

## 2011-11-23 MED ORDER — DIGOXIN 125 MCG PO TABS
125.0000 ug | ORAL_TABLET | Freq: Every day | ORAL | Status: DC
  Administered 2011-11-23 – 2011-11-29 (×7): 125 ug via ORAL
  Filled 2011-11-23 (×8): qty 1

## 2011-11-23 MED ORDER — HYDROMORPHONE HCL PF 1 MG/ML IJ SOLN: 1.0000 mg | INTRAMUSCULAR | Status: DC | PRN

## 2011-11-23 MED ORDER — ONDANSETRON HCL 4 MG PO TABS: 4.0000 mg | ORAL_TABLET | Freq: Four times a day (QID) | ORAL | Status: DC | PRN

## 2011-11-23 MED ORDER — HYDRALAZINE HCL 20 MG/ML IJ SOLN
10.0000 mg | Freq: Four times a day (QID) | INTRAMUSCULAR | Status: DC | PRN
  Administered 2011-11-23 – 2011-11-24 (×2): 10 mg via INTRAVENOUS
  Filled 2011-11-23 (×2): qty 1

## 2011-11-23 MED ORDER — ASPIRIN EC 81 MG PO TBEC
81.0000 mg | DELAYED_RELEASE_TABLET | Freq: Every day | ORAL | Status: DC
  Administered 2011-11-23 – 2011-11-29 (×7): 81 mg via ORAL
  Filled 2011-11-23 (×8): qty 1

## 2011-11-23 MED ORDER — KCL IN DEXTROSE-NACL 20-5-0.9 MEQ/L-%-% IV SOLN
INTRAVENOUS | Status: DC
  Administered 2011-11-23 (×2): via INTRAVENOUS
  Filled 2011-11-23 (×4): qty 1000

## 2011-11-23 MED ORDER — DILTIAZEM HCL ER COATED BEADS 180 MG PO CP24
360.0000 mg | ORAL_CAPSULE | Freq: Every day | ORAL | Status: DC
  Administered 2011-11-23 – 2011-11-29 (×7): 360 mg via ORAL
  Filled 2011-11-23 (×8): qty 2

## 2011-11-23 MED ORDER — OLANZAPINE 5 MG PO TABS
5.0000 mg | ORAL_TABLET | Freq: Every day | ORAL | Status: DC
  Administered 2011-11-23 – 2011-11-28 (×6): 5 mg via ORAL
  Filled 2011-11-23 (×8): qty 1

## 2011-11-23 MED ORDER — SODIUM CHLORIDE 0.9 % IV SOLN
INTRAVENOUS | Status: AC
  Administered 2011-11-23: 03:00:00 via INTRAVENOUS

## 2011-11-23 MED ORDER — ROSUVASTATIN CALCIUM 20 MG PO TABS
20.0000 mg | ORAL_TABLET | Freq: Every day | ORAL | Status: DC
  Administered 2011-11-23 – 2011-11-29 (×6): 20 mg via ORAL
  Filled 2011-11-23 (×9): qty 1

## 2011-11-23 NOTE — Progress Notes (Signed)
Dr. Ashley Royalty aware via phone ngt recently fell out of pt's nose while getting up to chair. Presently pt is sitting in chair, has passed flatus, feeling hungry, rating abd pain low, and denies nausea/vomiting. Dr. Ashley Royalty ordered to leave ngt out for now and offer pt sips and chips. MD to round on pt later.

## 2011-11-23 NOTE — H&P (Signed)
PCP:   [name] [name], MD, MD   Chief Complaint: Abdominal pain   HPI: [name] is an 60 y.o. female with history of atrial fibrillation on chronic anticoagulation with therapeutic INR, severe sleep apnea and morbid obesity, hypertension, hyperlipidemia, status post appendectomy and cholecystectomy, diabetes, infected prosthesis joint presents to the emergency room at [facility] long complaining of abdominal pain. This only happened today. She had 2 bowel movements but stated she had to strain. She did pass some gas. She has nausea and vomiting. She denied any chest pain but has baseline shortness of breath. She has had no fever or chills. She had no prior episodes similar to this. Evaluation in the emergency room included an abdominal pelvic CT which show small bowel obstruction with transitional point. Hospitalist was asked to admit her for small bowel obstruction.   Rewiew of Systems:  The patient denies  fever, weight loss,, vision loss, decreased hearing, hoarseness, chest pain, syncope, , peripheral edema, balance deficits, hemoptysis, , melena, hematochezia, severe indigestion/heartburn, hematuria, incontinence, genital sores, muscle weakness, suspicious skin lesions, transient blindness, difficulty walking, depression, unusual weight change, abnormal bleeding, enlarged lymph nodes, angioedema, and breast masses.   Past Medical History  Diagnosis Date  . Asthma   . Bronchitis   . Allergic rhinitis   . Diabetes mellitus   . Stroke   . HTN (hypertension)   . OSA (obstructive sleep apnea)   . DJD (degenerative joint disease)   . Kidney stones   . Depression   . Hyperlipidemia     Past Surgical History  Procedure Date  . Appendectomy   . Cholecystectomy   . Total knee arthroplasty   . Total abdominal hysterectomy   . Back surgery   . Cystectomy     left hand    Medications:  HOME MEDS: Prior to Admission medications   Medication Sig Start Date End Date Taking?  Authorizing Provider  albuterol (VENTOLIN HFA) 108 (90 BASE) MCG/ACT inhaler Inhale 2 puffs into the lungs every 6 (six) hours as needed.     Yes Historical Provider, MD  allopurinol (ZYLOPRIM) 100 MG tablet Take 1 tablet by mouth daily. 09/16/11  Yes Historical Provider, MD  aspirin 81 MG tablet Take 81 mg by mouth daily.     Yes Historical Provider, MD  clobetasol (TEMOVATE) 0.05 % ointment Use as directed on legs 04/29/11  Yes Historical Provider, MD  digoxin (LANOXIN) 0.125 MG tablet Take 1 tablet by mouth daily. 09/14/11  Yes Historical Provider, MD  diltiazem (TIAZAC) 360 MG 24 hr capsule Take 360 mg by mouth daily.     Yes Historical Provider, MD  fluticasone (FLOVENT HFA) 220 MCG/ACT inhaler Inhale 2 puffs into the lungs 3 (three) times daily.     Yes Historical Provider, MD  furosemide (LASIX) 40 MG tablet Take 60 mg by mouth daily.     Yes Historical Provider, MD  GNP LANCETS MICRO THIN 33G MISC  08/22/11  Yes Historical Provider, MD  insulin aspart (NOVOLOG) 100 UNIT/ML injection Inject 2 Units into the skin 3 (three) times daily before meals. 2units - 8units sliding scale    Yes Historical Provider, MD  LANTUS 100 UNIT/ML injection 10 units at bedtime 08/10/11  Yes Historical Provider, MD  methotrexate (RHEUMATREX) 2.5 MG tablet Take 10 mg by mouth once a week. Caution:Chemotherapy. Protect from light.    Yes Historical Provider, MD  metolazone (ZAROXOLYN) 2.5 MG tablet Take 1 tablet by mouth Daily. 04/08/11  Yes Historical Provider, MD  metoprolol (LOPRESSOR) 50 MG tablet Take 1 tablet by mouth 2 (two) times daily.  05/01/11  Yes Historical Provider, MD  OLANZapine (ZYPREXA) 5 MG tablet Take 5 mg by mouth at bedtime.     Yes Historical Provider, MD  promethazine (PHENERGAN) 12.5 MG tablet Take 12.5 mg by mouth every 6 (six) hours as needed.     Yes Historical Provider, MD  rosuvastatin (CRESTOR) 20 MG tablet Take 20 mg by mouth daily.     Yes Historical Provider, MD  spironolactone (ALDACTONE)  25 MG tablet Take 25 mg by mouth daily.     Yes Historical Provider, MD  warfarin (COUMADIN) 4 MG tablet Take 8 mg by mouth daily.    Yes Historical Provider, MD     Allergies:  Allergies  Allergen Reactions  . Daptomycin Other (See Comments)    Elevated CPK  . Morphine And Related Nausea And Vomiting  . Keflex Rash  . Celecoxib   . Codeine   . Fluoxetine Hcl   . Latex     REACTION: undefined  . Ofloxacin     unknown  . Penicillins   . Rofecoxib   . Sulfonamide Derivatives     Social History:   reports that she has quit smoking. Her smoking use included Cigarettes. She does not have any smokeless tobacco history on file. She reports that she does not drink alcohol or use illicit drugs.  Family History: Family History  Problem Relation Age of Onset  . Heart attack Father   . Asthma Father   . Heart disease Paternal Uncle   . Rectal cancer Paternal Aunt   . Other Mother     mva     Physical Exam: Filed Vitals:   11/23/11 0200 11/23/11 0215 11/23/11 0230 11/23/11 0241  BP:    136/72  Pulse: 79 80 77 98  Temp:    98 F (36.7 C)  TempSrc:    Oral  Resp: 19 17 15    SpO2: 94% 94% 96% 100%   Blood pressure 136/72, pulse 98, temperature 98 F (36.7 C), temperature source Oral, resp. rate 15, SpO2 100.00%.  GEN:  Pleasant female, morbidly obese,  lying in the stretcher in no acute distress; cooperative with exam PSYCH:  alert and oriented x4; does not appear anxious does not appear depressed; affect is normal HEENT: Mucous membranes pink and anicteric; PERRLA; EOM intact; no cervical lymphadenopathy nor thyromegaly or carotid bruit; no JVD; Breasts:: Not examined CHEST WALL: No tenderness CHEST: Normal respiration, clear to auscultation bilaterally HEART: Regular rate and rhythm; no murmurs rubs or gallops BACK: No kyphosis or scoliosis; no CVA tenderness ABDOMEN: Obese, soft, diffusely tender, no masses, no organomegaly, normal abdominal bowel sounds; no pannus;  there is no rebound. No right upper quadrant pain. It is quite  hypertympanic to percussion. No fluid waves. Rectal Exam: Not done EXTREMITIES: No bone or joint deformity; age-appropriate arthropathy of the hands and knees; no edema; no ulcerations. Genitalia: not examined PULSES: 2+ and symmetric SKIN: Normal hydration no rash or ulceration CNS: Cranial nerves 2-12 grossly intact no focal neurologic deficit   Labs & Imaging Results for orders placed during the hospital encounter of 11/22/11 (from the past 48 hour(s))  GLUCOSE, CAPILLARY     Status: Abnormal   Collection Time   11/22/11  8:06 PM      Component Value Range Comment   Glucose-Capillary 146 (*) 70 - 99 (mg/dL)   URINALYSIS, ROUTINE W REFLEX MICROSCOPIC  Status: Abnormal   Collection Time   11/22/11 10:05 PM      Component Value Range Comment   Color, Urine YELLOW  YELLOW     APPearance CLOUDY (*) CLEAR     Specific Gravity, Urine 1.024  1.005 - 1.030     pH 5.5  5.0 - 8.0     Glucose, UA 100 (*) NEGATIVE (mg/dL)    Hgb urine dipstick SMALL (*) NEGATIVE     Bilirubin Urine NEGATIVE  NEGATIVE     Ketones, ur NEGATIVE  NEGATIVE (mg/dL)    Protein, ur >130 (*) NEGATIVE (mg/dL)    Urobilinogen, UA 0.2  0.0 - 1.0 (mg/dL)    Nitrite NEGATIVE  NEGATIVE     Leukocytes, UA NEGATIVE  NEGATIVE    URINE MICROSCOPIC-ADD ON     Status: Abnormal   Collection Time   11/22/11 10:05 PM      Component Value Range Comment   Squamous Epithelial / LPF FEW (*) RARE     WBC, UA 0-2  <3 (WBC/hpf)    RBC / HPF 0-2  <3 (RBC/hpf)    Bacteria, UA RARE  RARE     Urine-Other AMORPHOUS URATES/PHOSPHATES     CBC     Status: Abnormal   Collection Time   11/22/11 11:30 PM      Component Value Range Comment   WBC 9.3  4.0 - 10.5 (K/uL)    RBC 3.85 (*) 3.87 - 5.11 (MIL/uL)    Hemoglobin 10.7 (*) 12.0 - 15.0 (g/dL)    HCT 86.5 (*) 78.4 - 46.0 (%)    MCV 88.3  78.0 - 100.0 (fL)    MCH 27.8  26.0 - 34.0 (pg)    MCHC 31.5  30.0 - 36.0 (g/dL)     RDW 69.6 (*) 29.5 - 15.5 (%)    Platelets 246  150 - 400 (K/uL)   DIFFERENTIAL     Status: Abnormal   Collection Time   11/22/11 11:30 PM      Component Value Range Comment   Neutrophils Relative 82 (*) 43 - 77 (%)    Neutro Abs 7.7  1.7 - 7.7 (K/uL)    Lymphocytes Relative 10 (*) 12 - 46 (%)    Lymphs Abs 0.9  0.7 - 4.0 (K/uL)    Monocytes Relative 6  3 - 12 (%)    Monocytes Absolute 0.6  0.1 - 1.0 (K/uL)    Eosinophils Relative 1  0 - 5 (%)    Eosinophils Absolute 0.1  0.0 - 0.7 (K/uL)    Basophils Relative 0  0 - 1 (%)    Basophils Absolute 0.0  0.0 - 0.1 (K/uL)   COMPREHENSIVE METABOLIC PANEL     Status: Abnormal   Collection Time   11/22/11 11:30 PM      Component Value Range Comment   Sodium 138  135 - 145 (mEq/L)    Potassium 4.4  3.5 - 5.1 (mEq/L)    Chloride 106  96 - 112 (mEq/L)    CO2 27  19 - 32 (mEq/L)    Glucose, Bld 131 (*) 70 - 99 (mg/dL)    BUN 19  6 - 23 (mg/dL)    Creatinine, Ser 2.84 (*) 0.50 - 1.10 (mg/dL)    Calcium 8.8  8.4 - 10.5 (mg/dL)    Total Protein 6.5  6.0 - 8.3 (g/dL)    Albumin 2.7 (*) 3.5 - 5.2 (g/dL)    AST 11  0 - 37 (  U/L)    ALT 8  0 - 35 (U/L)    Alkaline Phosphatase 83  39 - 117 (U/L)    Total Bilirubin 0.1 (*) 0.3 - 1.2 (mg/dL)    GFR calc non Af Amer 38 (*) >90 (mL/min)    GFR calc Af Amer 44 (*) >90 (mL/min)   PROTIME-INR     Status: Abnormal   Collection Time   11/22/11 11:30 PM      Component Value Range Comment   Prothrombin Time 32.5 (*) 11.6 - 15.2 (seconds)    INR 3.11 (*) 0.00 - 1.49    GLUCOSE, CAPILLARY     Status: Abnormal   Collection Time   11/23/11 12:50 AM      Component Value Range Comment   Glucose-Capillary 139 (*) 70 - 99 (mg/dL)    Comment 1 Notify RN      Ct Abdomen Pelvis W Contrast  11/23/2011  *RADIOLOGY REPORT*  Clinical Data: Abdominal pain  CT ABDOMEN AND PELVIS WITH CONTRAST  Technique:  Multidetector CT imaging of the abdomen and pelvis was performed following the standard protocol during bolus  administration of intravenous contrast.  Contrast: OMNIPAQUE IOHEXOL 300 MG/ML IV SOLN  Comparison: 08/07/2011 ultrasound  Findings: Bibasilar opacities.  Cardiomegaly.  Low attenuation of the liver without focal lesion.  Status post cholecystectomy.  There is mild central intrahepatic and moderate extrahepatic biliary ductal dilatation, with the CBD measuring up to 11 mm however tapering smoothly to the level of the ampulla. Unremarkable spleen, pancreas, left adrenal gland.  There is a 1 cm nodule arising from the medial limb of the right adrenal gland, incompletely characterized.  The right kidney is atrophic with a lobular contour.  There is a 1.5 cm water attenuation lesion within the lower pole.  There is a nonobstructing lower pole stone on the left.  In addition, within the upper/interpolar region of the left kidney, there is a 1.4 cm water attenuation lesion.  No hydronephrosis or hydroureter.  No ureteral calculi.  Sigmoid colon diverticulosis without diverticulitis.  The appendix is normal.  There is dilatation of proximal small bowel loops, with air-fluid levels and decompressed distal small bowel, in keeping with a small bowel obstruction pattern.  There is stranding of the mesenteric fat and a small amount of fluid which collects perihepatic and within the pelvis within the gutters.  Fat containing umbilical hernia with a moderate amount of periumbilical fat stranding is nonspecific.  No free intraperitoneal air.  No lymphadenopathy.  Normal caliber vasculature.  Scattered atherosclerotic calcification of the aorta and its branches.  Thin-walled bladder.  Absent uterus.  No adnexal mass.  Postsurgical changes of L4-S1.  Multilevel degenerative changes. No acute osseous abnormality.  IMPRESSION: Small bowel obstruction pattern.  Transition point in the midline lower abdomen.  Findings discussed via telephone with Remi Haggard at 12:58 on 11/23/2011.  Status post cholecystectomy.  There is mild  central intrahepatic and moderate extrahepatic biliary ductal dilatation, with the CBD measuring up to 11 mm however tapering smoothly to the level of the ampulla. Correlate with LFTs and ERCP / MRCP if clinically warranted.  Original Report Authenticated By: Waneta Martins, M.D.      Assessment Present on Admission:  .OBSTRUCTIVE SLEEP APNEA .Obesity hypoventilation syndrome .UNSPEC COMBINED SYSTOLIC&DIASTOLIC HEART FAILURE .ASTHMA .BRONCHITIS, RECURRENT .Prosthetic joint infection .HYPERTENSION .SBO (small bowel obstruction)   PLAN: I believe she has partial small bowel obstruction. She was able to have 2 bowel movements and still able to  pass gas today. Her abdominal exam is certainly not consistent with an acute abdomen at this point. She still have bowel sounds and she is not acidotic. Will place an NG tube to low wall suction and treat her conservatively and hope that she would resolve her partial small bowel obstruction. She would be a very poor candidate for any surgery. She is on Coumadin with therapeutic INR, but I will stop her Coumadin in case she'll need surgical intervention. I will give her intravenous fluid, made n.p.o., and will give her insulin sliding scale. Will stop her diuretic. With respect to her sleep apnea, she has not been using her CPAP machine. I will hold off on the CPAP machine because she has an NG tube and it would be difficult to have a proper seal. She is overall stable, full code, and will be admitted to [facility] hospitalists service. I will give her low dose analgesic and antiemetic.   Other plans as per orders.    Zineb Glade 11/23/2011, 3:43 AM

## 2011-11-23 NOTE — Progress Notes (Signed)
Subjective: Patient has no complaints except that she wants to eat.   Objective: Filed Vitals:   11/23/11 0842 11/23/11 1130 11/23/11 1131 11/23/11 1433  BP:  190/75  171/75  Pulse:   72 52  Temp:  97.8 F (36.6 C)  97.5 F (36.4 C)  TempSrc:  Oral  Oral  Resp:  18  16  Height:      Weight:      SpO2: 100%   100%   Weight change:   Intake/Output Summary (Last 24 hours) at 11/23/11 1848 Last data filed at 11/23/11 1822  Gross per 24 hour  Intake 1003.75 ml  Output    550 ml  Net 453.75 ml   General: Alert, awake, oriented x3, in no acute distress.  HEENT: [place]/AT PEERL, EOMI Neck: Trachea midline,  no masses, no thyromegal,y no JVD, no carotid bruit OROPHARYNX:  Moist, No exudate/ erythema/lesions.  Heart: Regular rate and rhythm, without murmurs, rubs, gallops, PMI non-displaced, no heaves or thrills on palpation.  Lungs: Clear to auscultation, no wheezing or rhonchi noted. No increased vocal fremitus resonant to percussion  Abdomen: Obese, Soft, nontender, nondistended, positive bowel sounds, no masses no hepatosplenomegaly noted..  Neuro: No focal neurological deficits noted cranial nerves II through XII grossly intact. DTRs 2+ bilaterally upper and lower extremities. Strength 3/5 in bilateral upper and lower extremities. Musculoskeletal: No warm swelling or erythema around joints, no spinal tenderness noted. Psychiatric: Patient alert and oriented x3, good insight and cognition, good recent to remote recall. Lymph node survey: No cervical axillary or inguinal lymphadenopathy noted.  Lab Results:  [facility] 11/23/11 0650 11/22/11 2330  NA 140 138  K 4.6 4.4  CL 107 106  CO2 28 27  GLUCOSE 154* 131*  BUN 17 19  CREATININE 1.46* 1.45*  CALCIUM 9.0 8.8  MG -- --  PHOS -- --    Basename 11/22/11 2330  AST 11  ALT 8  ALKPHOS 83  BILITOT 0.1*  PROT 6.5  ALBUMIN 2.7*   No results found for this basename: LIPASE:2,AMYLASE:2 in the last 72 hours  Basename  11/23/11 0650 11/22/11 2330  WBC 8.8 9.3  NEUTROABS -- 7.7  HGB 10.5* 10.7*  HCT 34.0* 34.0*  MCV 88.1 88.3  PLT 252 246   No results found for this basename: CKTOTAL:3,CKMB:3,CKMBINDEX:3,TROPONINI:3 in the last 72 hours No components found with this basename: POCBNP:3 No results found for this basename: DDIMER:2 in the last 72 hours  Basename 11/23/11 0650  HGBA1C 7.0*   No results found for this basename: CHOL:2,HDL:2,LDLCALC:2,TRIG:2,CHOLHDL:2,LDLDIRECT:2 in the last 72 hours No results found for this basename: TSH,T4TOTAL,FREET3,T3FREE,THYROIDAB in the last 72 hours No results found for this basename: VITAMINB12:2,FOLATE:2,FERRITIN:2,TIBC:2,IRON:2,RETICCTPCT:2 in the last 72 hours  Micro Results: Recent Results (from the past 240 hour(s))  MRSA PCR SCREENING     Status: Normal   Collection Time   11/23/11  5:24 AM      Component Value Range Status Comment   MRSA by PCR NEGATIVE  NEGATIVE  Final     Studies/Results: Ct Abdomen Pelvis W Contrast  11/23/2011  *RADIOLOGY REPORT*  Clinical Data: Abdominal pain  CT ABDOMEN AND PELVIS WITH CONTRAST  Technique:  Multidetector CT imaging of the abdomen and pelvis was performed following the standard protocol during bolus administration of intravenous contrast.  Contrast: OMNIPAQUE IOHEXOL 300 MG/ML IV SOLN  Comparison: 08/07/2011 ultrasound  Findings: Bibasilar opacities.  Cardiomegaly.  Low attenuation of the liver without focal lesion.  Status post cholecystectomy.  There  is mild central intrahepatic and moderate extrahepatic biliary ductal dilatation, with the CBD measuring up to 11 mm however tapering smoothly to the level of the ampulla. Unremarkable spleen, pancreas, left adrenal gland.  There is a 1 cm nodule arising from the medial limb of the right adrenal gland, incompletely characterized.  The right kidney is atrophic with a lobular contour.  There is a 1.5 cm water attenuation lesion within the lower pole.  There is a  nonobstructing lower pole stone on the left.  In addition, within the upper/interpolar region of the left kidney, there is a 1.4 cm water attenuation lesion.  No hydronephrosis or hydroureter.  No ureteral calculi.  Sigmoid colon diverticulosis without diverticulitis.  The appendix is normal.  There is dilatation of proximal small bowel loops, with air-fluid levels and decompressed distal small bowel, in keeping with a small bowel obstruction pattern.  There is stranding of the mesenteric fat and a small amount of fluid which collects perihepatic and within the pelvis within the gutters.  Fat containing umbilical hernia with a moderate amount of periumbilical fat stranding is nonspecific.  No free intraperitoneal air.  No lymphadenopathy.  Normal caliber vasculature.  Scattered atherosclerotic calcification of the aorta and its branches.  Thin-walled bladder.  Absent uterus.  No adnexal mass.  Postsurgical changes of L4-S1.  Multilevel degenerative changes. No acute osseous abnormality.  IMPRESSION: Small bowel obstruction pattern.  Transition point in the midline lower abdomen.  Findings discussed via telephone with Remi Haggard at 12:58 on 11/23/2011.  Status post cholecystectomy.  There is mild central intrahepatic and moderate extrahepatic biliary ductal dilatation, with the CBD measuring up to 11 mm however tapering smoothly to the level of the ampulla. Correlate with LFTs and ERCP / MRCP if clinically warranted.  Original Report Authenticated By: Waneta Martins, M.D.    Medications: I have reviewed the patient's current medications. Scheduled Meds:   . sodium chloride   Intravenous STAT  . aspirin EC  81 mg Oral Daily  . digoxin  125 mcg Oral Daily  . diltiazem  360 mg Oral Daily  . fluticasone  2 puff Inhalation TID  .  HYDROmorphone (DILAUDID) injection  1 mg Intravenous Once  . insulin aspart  0-20 Units Subcutaneous Q4H  . metoprolol  50 mg Oral BID  . OLANZapine  5 mg Oral QHS  .  ondansetron  4 mg Intravenous Once  . ondansetron  4 mg Intravenous Once  . rosuvastatin  20 mg Oral Daily  . sodium chloride  1,000 mL Intravenous Once   Continuous Infusions:   . dextrose 5 % and 0.9 % NaCl with KCl 20 mEq/L 75 mL/hr at 11/23/11 1822   PRN Meds:.albuterol, hydrALAZINE, HYDROmorphone, iohexol, ondansetron (ZOFRAN) IV, ondansetron Assessment/Plan: Patient Active Hospital Problem List: SBO (small bowel obstruction) (11/23/2011)   Assessment: Patient has accidentally pulled the NG tube out. However she has bowel sounds and is asking to allow oral intake. I started the patient on sips and chips based on clinical examination. Will get a x-ray of her abdomen in the morning to assess the extent of her ileus at that time.   OBSTRUCTIVE SLEEP APNEA (03/15/2010)   Assessment: Last respiratory to apply CPAP per their assessment.     HYPERTENSION (03/18/2010)   Assessment: Patient still has elevated blood pressures however they appear to be trending down.   UNSPEC COMBINED SYSTOLIC&DIASTOLIC HEART FAILURE (10/17/2010)   Assessment: Clinically compensated   ASTHMA (03/18/2010)   Assessment: Quiescent  Obesity hypoventilation syndrome (04/20/2010)   Assessment: Noted     Prosthetic joint infection (07/24/2011)   Assessment: Noted    DM (diabetes mellitus) (11/23/2011)   Assessment: Blood sugars adequately controlled. Will continue on current insulin regimen.      LOS: 1 day

## 2011-11-23 NOTE — ED Notes (Signed)
rn to call phlebotomy when patient arrives to floor for dig level to be drawn with am labs.

## 2011-11-24 ENCOUNTER — Inpatient Hospital Stay (HOSPITAL_COMMUNITY): Payer: Medicare HMO

## 2011-11-24 DIAGNOSIS — Z7901 Long term (current) use of anticoagulants: Secondary | ICD-10-CM

## 2011-11-24 LAB — DIFFERENTIAL
Basophils Absolute: 0 10*3/uL (ref 0.0–0.1)
Eosinophils Relative: 3 % (ref 0–5)
Lymphocytes Relative: 13 % (ref 12–46)
Lymphs Abs: 0.9 10*3/uL (ref 0.7–4.0)
Monocytes Absolute: 0.5 10*3/uL (ref 0.1–1.0)
Neutro Abs: 5.4 10*3/uL (ref 1.7–7.7)

## 2011-11-24 LAB — PRO B NATRIURETIC PEPTIDE: Pro B Natriuretic peptide (BNP): 1197 pg/mL — ABNORMAL HIGH (ref 0–125)

## 2011-11-24 LAB — BASIC METABOLIC PANEL
Chloride: 108 mEq/L (ref 96–112)
Creatinine, Ser: 1.58 mg/dL — ABNORMAL HIGH (ref 0.50–1.10)
GFR calc Af Amer: 40 mL/min — ABNORMAL LOW (ref >90)
Potassium: 4.5 mEq/L (ref 3.5–5.1)
Sodium: 140 mEq/L (ref 135–145)

## 2011-11-24 LAB — CBC
HCT: 32.6 % — ABNORMAL LOW (ref 36.0–46.0)
MCV: 90.8 fL (ref 78.0–100.0)
RBC: 3.59 MIL/uL — ABNORMAL LOW (ref 3.87–5.11)
WBC: 7.1 10*3/uL (ref 4.0–10.5)

## 2011-11-24 LAB — GLUCOSE, CAPILLARY
Glucose-Capillary: 125 mg/dL — ABNORMAL HIGH (ref 70–99)
Glucose-Capillary: 132 mg/dL — ABNORMAL HIGH (ref 70–99)
Glucose-Capillary: 147 mg/dL — ABNORMAL HIGH (ref 70–99)
Glucose-Capillary: 120 mg/dL — ABNORMAL HIGH (ref 70–99)

## 2011-11-24 LAB — PROTIME-INR: Prothrombin Time: 29.1 seconds — ABNORMAL HIGH (ref 11.6–15.2)

## 2011-11-24 MED ORDER — FUROSEMIDE 10 MG/ML IJ SOLN
40.0000 mg | Freq: Once | INTRAMUSCULAR | Status: AC
  Administered 2011-11-24: 40 mg via INTRAVENOUS
  Filled 2011-11-24: qty 4

## 2011-11-24 MED ORDER — WARFARIN SODIUM 3 MG PO TABS
3.0000 mg | ORAL_TABLET | Freq: Once | ORAL | Status: AC
  Administered 2011-11-24: 3 mg via ORAL
  Filled 2011-11-24: qty 1

## 2011-11-24 MED ORDER — METOLAZONE 2.5 MG PO TABS
2.5000 mg | ORAL_TABLET | Freq: Every day | ORAL | Status: DC
  Administered 2011-11-24 – 2011-11-29 (×6): 2.5 mg via ORAL
  Filled 2011-11-24 (×7): qty 1

## 2011-11-24 MED ORDER — ALBUTEROL SULFATE (5 MG/ML) 0.5% IN NEBU: 2.5000 mg | INHALATION_SOLUTION | RESPIRATORY_TRACT | Status: DC | PRN

## 2011-11-24 MED ORDER — FUROSEMIDE 10 MG/ML IJ SOLN
80.0000 mg | Freq: Two times a day (BID) | INTRAMUSCULAR | Status: DC
  Administered 2011-11-24 – 2011-11-28 (×10): 80 mg via INTRAVENOUS
  Filled 2011-11-24 (×13): qty 8

## 2011-11-24 MED ORDER — SPIRONOLACTONE 25 MG PO TABS
25.0000 mg | ORAL_TABLET | Freq: Every day | ORAL | Status: DC
  Administered 2011-11-24 – 2011-11-29 (×6): 25 mg via ORAL
  Filled 2011-11-24 (×7): qty 1

## 2011-11-24 MED ORDER — FUROSEMIDE 10 MG/ML IJ SOLN
80.0000 mg | Freq: Two times a day (BID) | INTRAMUSCULAR | Status: DC
  Filled 2011-11-24: qty 8

## 2011-11-24 MED ORDER — SODIUM CHLORIDE 0.9 % IV SOLN
INTRAVENOUS | Status: DC
  Administered 2011-11-23 – 2011-11-24 (×2): via INTRAVENOUS

## 2011-11-24 MED ORDER — FUROSEMIDE 10 MG/ML IJ SOLN
20.0000 mg | Freq: Once | INTRAMUSCULAR | Status: DC
  Filled 2011-11-24: qty 2

## 2011-11-24 MED ORDER — WARFARIN SODIUM 5 MG IV SOLR
3.0000 mg | Freq: Once | INTRAVENOUS | Status: DC
  Filled 2011-11-24 (×2): qty 1.5

## 2011-11-24 MED ORDER — ALBUTEROL SULFATE (5 MG/ML) 0.5% IN NEBU
INHALATION_SOLUTION | RESPIRATORY_TRACT | Status: AC
  Administered 2011-11-24: 2.5 mg
  Filled 2011-11-24: qty 0.5

## 2011-11-24 NOTE — Progress Notes (Signed)
Subjective: Patient had some difficulty with breathing overnight. A chest x-ray was done which showed some pulmonary edema. Her pro BNP was also obtained which is elevated. Please also note the patient has obstructive sleep apnea and has been without her CPAP for approximately 6 months. Patient states that she gets her machine for appropriate but has balance and there count and they have been unwilling to assist her in restoring her machine until her balance hasn't paid off.  Patient says her abdomen feels much better. She's been passing flatus since yesterday but has not had a bowel movement. She is actually hungry and asking that her diet be advanced. The patient is on chronic anticoagulation however she's unsure as to why she is on this but states that she's been on her anticoagulant for many years. Objective: Filed Vitals:   11/24/11 0550 11/24/11 0815 11/24/11 0818 11/24/11 1000  BP:    161/70  Pulse:  59  65  Temp:    97.9 F (36.6 C)  TempSrc:    Oral  Resp:  21  18  Height:      Weight: 135.4 kg (298 lb 8.1 oz)     SpO2:  98% 98% 98%   Weight change: 0.2 kg (7.1 oz)  Intake/Output Summary (Last 24 hours) at 11/24/11 1402 Last data filed at 11/24/11 1244  Gross per 24 hour  Intake 1243.42 ml  Output   1900 ml  Net -656.58 ml    General: Alert, awake, oriented x3, in no acute distress. Obese  HEENT: [place]/AT [place]ER[place], EOMI Neck: Trachea midline,  no masses, no thyromegal,y no JVD, no carotid bruit OROPHARYNX:  Moist, No exudate/ erythema/lesions.  Heart: Regular rate and rhythm, without murmurs, rubs, gallops, PMI non-displaced, no heaves or thrills on palpation.  Lungs: Mild bibasilar rales, no wheezing or rhonchi noted. No increased vocal fremitus resonant to percussion  Abdomen: Soft, nontender, nondistended, positive bowel sounds, no masses no hepatosplenomegaly noted..  Neuro: No focal neurological deficits noted cranial nerves II through XII grossly intact. DTRs 2+ bilaterally  upper and lower extremities. Strength 5 out of 5 in bilateral upper and lower extremities. Musculoskeletal: No warm swelling or erythema around joints, no spinal tenderness noted. Patient has some lichenification of her bilateral lower extremities. Psychiatric: Patient alert and oriented x3, good insight and cognition, good recent to remote recall. Lymph node survey: No cervical axillary or inguinal lymphadenopathy noted.     Lab Results:  [facility] 11/24/11 0425 11/23/11 0650  NA 140 140  K 4.5 4.6  CL 108 107  CO2 27 28  GLUCOSE 122* 154*  BUN 15 17  CREATININE 1.58* 1.46*  CALCIUM 8.5 9.0  MG -- --  PHOS -- --    Basename 11/22/11 2330  AST 11  ALT 8  ALKPHOS 83  BILITOT 0.1*  PROT 6.5  ALBUMIN 2.7*   No results found for this basename: LIPASE:2,AMYLASE:2 in the last 72 hours  Basename 11/24/11 0425 11/23/11 0650 11/22/11 2330  WBC 7.1 8.8 --  NEUTROABS 5.4 -- 7.7  HGB 9.9* 10.5* --  HCT 32.6* 34.0* --  MCV 90.8 88.1 --  PLT 250 252 --    Micro Results: Recent Results (from the past 240 hour(s))  MRSA PCR SCREENING     Status: Normal   Collection Time   11/23/11  5:24 AM      Component Value Range Status Comment   MRSA by PCR NEGATIVE  NEGATIVE  Final     Studies/Results: Dg Abd  1 View  11/24/2011  *RADIOLOGY REPORT*  Clinical Data: Evaluate small bowel obstruction  ABDOMEN - 1 VIEW  Comparison: Abdominal CT - 11/23/2011  Findings: Examination is markedly degraded secondary to portable technique and patient body habitus necessitating multiple abdominal radiographs.  The right upper abdominal quadrant was excluded from view.  There is an overall paucity of bowel gas.  Nondiagnostic evaluation for pneumoperitoneum.  Postsurgical change of the lower lumbar spine.  IMPRESSION: Markedly degraded examination with overall paucity of bowel gas.  Original Report Authenticated By: Waynard Reeds, M.D.   Dg Chest Port 1 View  11/24/2011  *RADIOLOGY REPORT*  Clinical  Data: Shortness of breath  PORTABLE CHEST - 1 VIEW  Comparison: 07/26/2011  Findings: Cardiomegaly.  Central vascular congestion.  Interstitial and mild perihilar airspace opacities.  No pneumothorax.  Mild retrocardiac opacity.  Mildly tortuous and ectatic thoracic aorta. The right humeral head is subluxed inferiorly.  This may be positional however correlate with range of motion.  IMPRESSION: Cardiomegaly with pulmonary edema pattern.  Subluxation of the right humeral head, may be positional however correlate with range of motion.  Original Report Authenticated By: Waneta Martins, M.D.    Medications: I have reviewed the patient's current medications. Scheduled Meds:   . sodium chloride   Intravenous STAT  . albuterol      . aspirin EC  81 mg Oral Daily  . digoxin  125 mcg Oral Daily  . diltiazem  360 mg Oral Daily  . fluticasone  2 puff Inhalation TID  . furosemide  40 mg Intravenous Once  . furosemide  80 mg Intravenous BID  . insulin aspart  0-20 Units Subcutaneous Q4H  . metolazone  2.5 mg Oral Daily  . metoprolol  50 mg Oral BID  . OLANZapine  5 mg Oral QHS  . rosuvastatin  20 mg Oral Daily  . spironolactone  25 mg Oral Daily  . warfarin  3 mg Intravenous ONCE-1800  . DISCONTD: furosemide  20 mg Intramuscular Once  . DISCONTD: furosemide  80 mg Intravenous BID  . DISCONTD: furosemide  80 mg Intravenous BID   Continuous Infusions:   . sodium chloride 20 mL/hr at 11/24/11 1244  . DISCONTD: dextrose 5 % and 0.9 % NaCl with KCl 20 mEq/L 75 mL/hr at 11/23/11 1822   PRN Meds:.albuterol, albuterol, hydrALAZINE, HYDROmorphone, ondansetron (ZOFRAN) IV, ondansetron Assessment/Plan: Patient Active Hospital Problem List: SBO (small bowel obstruction) (11/23/2011)   Assessment: Patient has had no nausea or vomiting his NG tube being out. She is tolerating her sips and chips well. Her clinical examination is not impressive and that they'll go ahead and get patient started clear liquids  and see how she tolerates them. The patient continues to pass flatus although she's not had a bowel movement.   OBSTRUCTIVE SLEEP APNEA (03/15/2010)   Assessment: Has known obstructive sleep apnea and obesity hypoventilation syndrome. She relates that she's been without CPAP for approximately 6 months. I had a long discussion with the patient about the importance of having her CPAP and the complications that are associated with untreated obstructive sleep apnea. The patient offered that she was unable to afford the payoff that she has been 43 home care. Advised the patient to call her insurance company to see if they can assist her with obtaining a CPAP machine. I will also ask the case management here to see if they can make in the interim since having this patient to date her CPAP here in the  meantime I'll ask respiratory therapy to try CPAP at night for this patient while hospitalized.     HYPERTENSION (03/18/2010)   Assessment: The pressure well-controlled.    Acute combined systolic and diastolic heart failure (10/17/2010)   Assessment: The patient was without her diuretics yesterday due to her current clinical condition. However overnight the patient had some pulmonary edema. Given the patient 40 of Lasix IV and schedule her to have an additional 80 mg IV. We'll resume her Zaroxolyn and her other diuretics. And we will assess the patient's clinical condition. Although the patient has an elevated pro BNP I am not sure this is an accurate representation of the patient's volume status with relation to her heart failure. The patient will be monitored on a clinical basis.   BRONCHITIS, RECURRENT (03/15/2010)   Assessment: This appears to be stable at this time.    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 I anticipate she'll have an increase in her blood sugars when she is able to tolerate a diet.     Chronic anticoagulation (11/24/2011)   Assessment: The patient is on chronic anticoagulation and she's unsure of the reason. However today check a PT/INR net pharmacy to assist in titrating the patient's medications to a supratherapeutic goal. The patient states that her target range is 2-3.      LOS: 2 days   MATTHEWS,MICHELLE A. PAGER

## 2011-11-24 NOTE — Progress Notes (Signed)
Lenny Pastel NP paged pt with sob with wheezing. Pt without prn neb order.  Inhaler not available.  Pt sat decreased from high 90's to mid 80's.  Resp therapy on floor to evaluate.  Albuterol neb given. Pt sat up to 95% on 4 liters. Pt resp up to 24-26.  Orders received at return call from [facility], await cxr and bnp.

## 2011-11-24 NOTE — ED Provider Notes (Signed)
Medical screening examination/treatment/procedure(s) were conducted as a shared visit with non-physician practitioner(s) and myself.  I personally evaluated the patient during the encounter.  Pt with h/o multiple prior abdominal surgeries, worsening abd pain over the last several days, difficulties with BM.  Diffuse abd pain, decreased bowel sounds.  Concern for sbo.  To have CT abd/pelvis, full labs.  Olivia Mackie, MD 11/24/11 [phone]

## 2011-11-24 NOTE — Progress Notes (Signed)
ANTICOAGULATION CONSULT NOTE - Initial Consult  Pharmacy Consult for Coumadin Indication: atrial fibrillation  Allergies  Allergen Reactions  . Daptomycin Other (See Comments)    Elevated CPK  . Morphine And Related Nausea And Vomiting  . Keflex Rash  . Celecoxib   . Codeine   . Fluoxetine Hcl   . Latex     REACTION: undefined  . Ofloxacin     unknown  . Penicillins   . Rofecoxib   . Sulfonamide Derivatives     Patient Measurements: Height: 5\' 3"  (160 cm) Weight: 298 lb 8.1 oz (135.4 kg) IBW/kg (Calculated) : 52.4  Adjusted Body Weight:   Vital Signs: Temp: 97.9 F (36.6 C) (12/16 1000) Temp src: Oral (12/16 1000) BP: 161/70 mmHg (12/16 1000) Pulse Rate: 65  (12/16 1000)  Labs:  Basename 11/24/11 1030 11/24/11 0425 11/23/11 0650 11/22/11 2330  HGB -- 9.9* 10.5* --  HCT -- 32.6* 34.0* 34.0*  PLT -- 250 252 246  APTT -- -- -- --  LABPROT 29.1* -- -- 32.5*  INR 2.70* -- -- 3.11*  HEPARINUNFRC -- -- -- --  CREATININE -- 1.58* 1.46* 1.45*  CKTOTAL -- -- -- --  CKMB -- -- -- --  TROPONINI -- -- -- --   Estimated Creatinine Clearance: 50.5 ml/min (by C-G formula based on Cr of 1.58).  Medical History: Past Medical History  Diagnosis Date  . Asthma   . Bronchitis   . Allergic rhinitis   . Diabetes mellitus   . Stroke   . HTN (hypertension)   . OSA (obstructive sleep apnea)   . DJD (degenerative joint disease)   . Kidney stones   . Depression   . Hyperlipidemia   . Complication of anesthesia     trouble waking up    Medications:  Prescriptions prior to admission  Medication Sig Dispense Refill  . albuterol (VENTOLIN HFA) 108 (90 BASE) MCG/ACT inhaler Inhale 2 puffs into the lungs every 6 (six) hours as needed.        [name] allopurinol (ZYLOPRIM) 100 MG tablet Take 1 tablet by mouth daily.      [name] aspirin 81 MG tablet Take 81 mg by mouth daily.        . clobetasol (TEMOVATE) 0.05 % ointment Use as directed on legs      . digoxin (LANOXIN) 0.125 MG tablet  Take 1 tablet by mouth daily.      [name] diltiazem (TIAZAC) 360 MG 24 hr capsule Take 360 mg by mouth daily.        . fluticasone (FLOVENT HFA) 220 MCG/ACT inhaler Inhale 2 puffs into the lungs 3 (three) times daily.        . furosemide (LASIX) 40 MG tablet Take 60 mg by mouth daily.        . GNP LANCETS MICRO THIN 33G MISC       . insulin aspart (NOVOLOG) 100 UNIT/ML injection Inject 2 Units into the skin 3 (three) times daily before meals. 2units - 8units sliding scale       . LANTUS 100 UNIT/ML injection 10 units at bedtime      . methotrexate (RHEUMATREX) 2.5 MG tablet Take 10 mg by mouth once a week. Caution:Chemotherapy. Protect from light.       . metolazone (ZAROXOLYN) 2.5 MG tablet Take 1 tablet by mouth Daily.      . metoprolol (LOPRESSOR) 50 MG tablet Take 1 tablet by mouth 2 (two) times daily.       [name]  OLANZapine (ZYPREXA) 5 MG tablet Take 5 mg by mouth at bedtime.        . promethazine (PHENERGAN) 12.5 MG tablet Take 12.5 mg by mouth every 6 (six) hours as needed.        . rosuvastatin (CRESTOR) 20 MG tablet Take 20 mg by mouth daily.        [name] spironolactone (ALDACTONE) 25 MG tablet Take 25 mg by mouth daily.        [name] warfarin (COUMADIN) 4 MG tablet Take 8 mg by mouth daily.         Assessment:  60 y.o. female with history of Afib on chronic anticoag  INR currently therapeutic 2.7 (slightly supratherapeutic on admission - 3.11)  Home dose reported as 8mg  po daily  Admitted with SBO, therefore is NPO  Will give coumadin IV at this point  Goal of Therapy:  INR 2-3   Plan:   Give ~half usual dose coumadin - 3mg  IV today  Daily PT/INR   [name] 11/24/2011,11:44 AM Pager: [phone]

## 2011-11-24 NOTE — Plan of Care (Signed)
Problem: Phase III Progression Outcomes Goal: Activity at appropriate level-compared to baseline (UP IN CHAIR FOR HEMODIALYSIS)  Outcome: Not Progressing Staff offered to assist pt in getting up to chair today yet refused. "i just don't feel like it today."

## 2011-11-24 NOTE — Progress Notes (Signed)
Addendum to previous PharmD note  RN called at 1900 stating advancing diet and patient able to tolerate PO meds.  Plan :  Change Coumadin 3 mg IV to PO.  Pharmacy will follow up in AM.    Geoffry Paradise, PharmD.  Pager:  [phone] 7:08 PM

## 2011-11-24 NOTE — Progress Notes (Signed)
Pt states has been non compliant with Cpap at home for some time, although agrees to attempt wearing one tonight. Placed pt on Cpap auto titrate with humidification and 4L 02 bleed in. tol well at this time. Vss. Rt to assist as needed.

## 2011-11-24 NOTE — Progress Notes (Signed)
BNP results text paged to Lenny Pastel NP await response. Page returned result of 1197.0 given verbally.  Await CXR results. No orders at this time.  Xray called to check on status of film.  Await a read of film from radiologist

## 2011-11-25 DIAGNOSIS — B370 Candidal stomatitis: Secondary | ICD-10-CM | POA: Clinically undetermined

## 2011-11-25 LAB — BASIC METABOLIC PANEL
CO2: 33 mEq/L — ABNORMAL HIGH (ref 19–32)
Chloride: 102 mEq/L (ref 96–112)
Creatinine, Ser: 1.73 mg/dL — ABNORMAL HIGH (ref 0.50–1.10)
GFR calc Af Amer: 36 mL/min — ABNORMAL LOW (ref >90)
Potassium: 4 mEq/L (ref 3.5–5.1)

## 2011-11-25 LAB — GLUCOSE, CAPILLARY
Glucose-Capillary: 118 mg/dL — ABNORMAL HIGH (ref 70–99)
Glucose-Capillary: 103 mg/dL — ABNORMAL HIGH (ref 70–99)

## 2011-11-25 LAB — DIFFERENTIAL
Basophils Absolute: 0 10*3/uL (ref 0.0–0.1)
Eosinophils Relative: 3 % (ref 0–5)
Lymphocytes Relative: 10 % — ABNORMAL LOW (ref 12–46)
Lymphs Abs: 0.8 10*3/uL (ref 0.7–4.0)
Monocytes Absolute: 0.8 10*3/uL (ref 0.1–1.0)
Monocytes Relative: 11 % (ref 3–12)
Neutro Abs: 5.5 10*3/uL (ref 1.7–7.7)

## 2011-11-25 LAB — CBC
HCT: 31.5 % — ABNORMAL LOW (ref 36.0–46.0)
Hemoglobin: 9.6 g/dL — ABNORMAL LOW (ref 12.0–15.0)
MCV: 89 fL (ref 78.0–100.0)
RBC: 3.54 MIL/uL — ABNORMAL LOW (ref 3.87–5.11)
WBC: 7.3 10*3/uL (ref 4.0–10.5)

## 2011-11-25 LAB — PROTIME-INR
INR: 2.38 — ABNORMAL HIGH (ref 0.00–1.49)
Prothrombin Time: 26.4 seconds — ABNORMAL HIGH (ref 11.6–15.2)

## 2011-11-25 MED ORDER — NYSTATIN 100000 UNIT/ML MT SUSP
5.0000 mL | Freq: Four times a day (QID) | OROMUCOSAL | Status: DC
  Administered 2011-11-25 – 2011-11-29 (×15): 500000 [IU] via ORAL
  Filled 2011-11-25 (×2): qty 60

## 2011-11-25 MED ORDER — SODIUM CHLORIDE 0.45 % IV SOLN
INTRAVENOUS | Status: DC
  Administered 2011-11-25: 20 mL/h via INTRAVENOUS

## 2011-11-25 MED ORDER — MILK AND MOLASSES ENEMA
Freq: Once | RECTAL | Status: DC
  Filled 2011-11-25: qty 250

## 2011-11-25 MED ORDER — DOCUSATE SODIUM 100 MG PO CAPS
100.0000 mg | ORAL_CAPSULE | Freq: Two times a day (BID) | ORAL | Status: DC
  Administered 2011-11-25 – 2011-11-29 (×8): 100 mg via ORAL
  Filled 2011-11-25 (×11): qty 1

## 2011-11-25 MED ORDER — WARFARIN SODIUM 5 MG PO TABS
5.0000 mg | ORAL_TABLET | Freq: Once | ORAL | Status: AC
  Administered 2011-11-25: 5 mg via ORAL
  Filled 2011-11-25: qty 1

## 2011-11-25 NOTE — Progress Notes (Signed)
ANTICOAGULATION CONSULT NOTE - Initial Consult  Pharmacy Consult for Coumadin Indication: atrial fibrillation  Allergies  Allergen Reactions  . Daptomycin Other (See Comments)    Elevated CPK  . Morphine And Related Nausea And Vomiting  . Keflex Rash  . Celecoxib   . Codeine   . Fluoxetine Hcl   . Latex     REACTION: undefined  . Ofloxacin     unknown  . Penicillins   . Rofecoxib   . Sulfonamide Derivatives     Patient Measurements: Height: 5\' 3"  (160 cm) Weight: 285 lb 15 oz (129.7 kg) IBW/kg (Calculated) : 52.4  Adjusted Body Weight:   Vital Signs: Temp: 98.2 F (36.8 C) (12/17 0430) Temp src: Oral (12/17 0430) BP: 160/67 mmHg (12/17 0430) Pulse Rate: 53  (12/17 0430)  Labs:  Basename 11/25/11 0423 11/24/11 1030 11/24/11 0425 11/23/11 0650 11/22/11 2330  HGB 9.6* -- 9.9* -- --  HCT 31.5* -- 32.6* 34.0* --  PLT 233 -- 250 252 --  APTT -- -- -- -- --  LABPROT -- 29.1* -- -- 32.5*  INR -- 2.70* -- -- 3.11*  HEPARINUNFRC -- -- -- -- --  CREATININE 1.73* -- 1.58* 1.46* --  CKTOTAL -- -- -- -- --  CKMB -- -- -- -- --  TROPONINI -- -- -- -- --   Estimated Creatinine Clearance: 44.9 ml/min (by C-G formula based on Cr of 1.73).  Medical History: Past Medical History  Diagnosis Date  . Asthma   . Bronchitis   . Allergic rhinitis   . Diabetes mellitus   . Stroke   . HTN (hypertension)   . OSA (obstructive sleep apnea)   . DJD (degenerative joint disease)   . Kidney stones   . Depression   . Hyperlipidemia   . Complication of anesthesia     trouble waking up    Medications:  Prescriptions prior to admission  Medication Sig Dispense Refill  . albuterol (VENTOLIN HFA) 108 (90 BASE) MCG/ACT inhaler Inhale 2 puffs into the lungs every 6 (six) hours as needed.        [name] allopurinol (ZYLOPRIM) 100 MG tablet Take 1 tablet by mouth daily.      [name] aspirin 81 MG tablet Take 81 mg by mouth daily.        . clobetasol (TEMOVATE) 0.05 % ointment Use as directed on  legs      . digoxin (LANOXIN) 0.125 MG tablet Take 1 tablet by mouth daily.      [name] diltiazem (TIAZAC) 360 MG 24 hr capsule Take 360 mg by mouth daily.        . fluticasone (FLOVENT HFA) 220 MCG/ACT inhaler Inhale 2 puffs into the lungs 3 (three) times daily.        . furosemide (LASIX) 40 MG tablet Take 60 mg by mouth daily.        . GNP LANCETS MICRO THIN 33G MISC       . insulin aspart (NOVOLOG) 100 UNIT/ML injection Inject 2 Units into the skin 3 (three) times daily before meals. 2units - 8units sliding scale       . LANTUS 100 UNIT/ML injection 10 units at bedtime      . methotrexate (RHEUMATREX) 2.5 MG tablet Take 10 mg by mouth once a week. Caution:Chemotherapy. Protect from light.       . metolazone (ZAROXOLYN) 2.5 MG tablet Take 1 tablet by mouth Daily.      . metoprolol (LOPRESSOR) 50 MG tablet Take 1  tablet by mouth 2 (two) times daily.       [name] OLANZapine (ZYPREXA) 5 MG tablet Take 5 mg by mouth at bedtime.        . promethazine (PHENERGAN) 12.5 MG tablet Take 12.5 mg by mouth every 6 (six) hours as needed.        . rosuvastatin (CRESTOR) 20 MG tablet Take 20 mg by mouth daily.        [name] spironolactone (ALDACTONE) 25 MG tablet Take 25 mg by mouth daily.        [name] warfarin (COUMADIN) 4 MG tablet Take 8 mg by mouth daily.         Assessment:  60 y.o. female with history of Afib on chronic anticoag  Admitted with SBO  Home dose reported as 8mg  po daily  CL diet  Goal of Therapy:  INR 2-3   Plan:   Give 5mg  today.  Daily PT/INR   Reece Packer 11/25/2011,9:37 AM Pager: [phone]

## 2011-11-25 NOTE — Progress Notes (Signed)
Subjective: The patient was seen on rounds today.  The patient was sitting quietly in the reclining chair watching television and visiting with her nurse .  The patient states that she is currently pain free, but does experience some mid-abdominal pain with palpation.  The patient stated that her NGT was removed yesterday.  The patient remains on a clear liquid diet that she is tolerating well.  The patient has not had any emesis, she does have flatus and she has had a very small, hard bowel movement.  The patient requested an enema or a laxative.  Let the patient know she will be receiving an enema.  No other nursing or patient concerns.   Objective: Vital signs in last 24 hours: Blood pressure 155/68, pulse 60, temperature 97.7 F (36.5 C), temperature source Oral, resp. rate 18, height 5\' 3"  (1.6 m), weight 285 lb 15 oz (129.7 kg), SpO2 100.00%.  General appearance: Alert, cooperative, no distress and morbidly obese Head: Normocephalic, without obvious abnormality, atraumatic Eyes: Aonjunctivae/corneas clear. PERRL, EOM's intact Nose: Nares normal. Septum midline. Mucosa normal. No drainage or sinus tenderness. Throat: oral candidiasis Resp: Diminished breath sounds bibasilar Cardio: Regular rate and rhythm, S1, S2 normal, no murmur, click, rub or gallop GI: Soft, excessive body habitus, mid abdominal tenderness, normoactive bowel sounds Extremities: +3 LE Edema,  bilateral LE hyperpigmentation and venous stasis dermatitis noted Pulses: +1 bilateral LE Skin: Bilateral LE venous stasis, well healed scars on trunk and extremities Neurologic: Grossly normal, no focal deficits, CN II - XII intact Psych:  Appropriate affect  Lab Results: Results for orders placed during the hospital encounter of 11/22/11 (from the past 24 hour(s))  GLUCOSE, CAPILLARY     Status: Abnormal   Collection Time   11/24/11  7:49 PM      Component Value Range   Glucose-Capillary 141 (*) 70 - 99 (mg/dL)   Comment  1 Notify RN    GLUCOSE, CAPILLARY     Status: Abnormal   Collection Time   11/24/11 11:40 PM      Component Value Range   Glucose-Capillary 116 (*) 70 - 99 (mg/dL)   Comment 1 Notify RN    BASIC METABOLIC PANEL     Status: Abnormal   Collection Time   11/25/11  4:23 AM      Component Value Range   Sodium 140  135 - 145 (mEq/L)   Potassium 4.0  3.5 - 5.1 (mEq/L)   Chloride 102  96 - 112 (mEq/L)   CO2 33 (*) 19 - 32 (mEq/L)   Glucose, Bld 110 (*) 70 - 99 (mg/dL)   BUN 16  6 - 23 (mg/dL)   Creatinine, Ser 4.09 (*) 0.50 - 1.10 (mg/dL)   Calcium 8.9  8.4 - 81.1 (mg/dL)   GFR calc non Af Amer 31 (*) >90 (mL/min)   GFR calc Af Amer 36 (*) >90 (mL/min)  CBC     Status: Abnormal   Collection Time   11/25/11  4:23 AM      Component Value Range   WBC 7.3  4.0 - 10.5 (K/uL)   RBC 3.54 (*) 3.87 - 5.11 (MIL/uL)   Hemoglobin 9.6 (*) 12.0 - 15.0 (g/dL)   HCT 91.4 (*) 78.2 - 46.0 (%)   MCV 89.0  78.0 - 100.0 (fL)   MCH 27.1  26.0 - 34.0 (pg)   MCHC 30.5  30.0 - 36.0 (g/dL)   RDW 95.6 (*) 21.3 - 15.5 (%)   Platelets 233  150 -  400 (K/uL)  DIFFERENTIAL     Status: Abnormal   Collection Time   11/25/11  4:23 AM      Component Value Range   Neutrophils Relative 76  43 - 77 (%)   Neutro Abs 5.5  1.7 - 7.7 (K/uL)   Lymphocytes Relative 10 (*) 12 - 46 (%)   Lymphs Abs 0.8  0.7 - 4.0 (K/uL)   Monocytes Relative 11  3 - 12 (%)   Monocytes Absolute 0.8  0.1 - 1.0 (K/uL)   Eosinophils Relative 3  0 - 5 (%)   Eosinophils Absolute 0.2  0.0 - 0.7 (K/uL)   Basophils Relative 0  0 - 1 (%)   Basophils Absolute 0.0  0.0 - 0.1 (K/uL)  GLUCOSE, CAPILLARY     Status: Abnormal   Collection Time   11/25/11  4:27 AM      Component Value Range   Glucose-Capillary 118 (*) 70 - 99 (mg/dL)   Comment 1 Notify RN    GLUCOSE, CAPILLARY     Status: Abnormal   Collection Time   11/25/11  7:58 AM      Component Value Range   Glucose-Capillary 103 (*) 70 - 99 (mg/dL)  PROTIME-INR     Status: Abnormal    Collection Time   11/25/11 10:15 AM      Component Value Range   Prothrombin Time 26.4 (*) 11.6 - 15.2 (seconds)   INR 2.38 (*) 0.00 - 1.49   GLUCOSE, CAPILLARY     Status: Abnormal   Collection Time   11/25/11 12:11 PM      Component Value Range   Glucose-Capillary 140 (*) 70 - 99 (mg/dL)  GLUCOSE, CAPILLARY     Status: Abnormal   Collection Time   11/25/11  4:42 PM      Component Value Range   Glucose-Capillary 127 (*) 70 - 99 (mg/dL)     Studies/Results: Dg Abd 1 View  11/24/2011  *RADIOLOGY REPORT*  Clinical Data: Evaluate small bowel obstruction  ABDOMEN - 1 VIEW  Comparison: Abdominal CT - 11/23/2011  Findings: Examination is markedly degraded secondary to portable technique and patient body habitus necessitating multiple abdominal radiographs.  The right upper abdominal quadrant was excluded from view.  There is an overall paucity of bowel gas.  Nondiagnostic evaluation for pneumoperitoneum.  Postsurgical change of the lower lumbar spine.  IMPRESSION: Markedly degraded examination with overall paucity of bowel gas.  Original Report Authenticated By: Waynard Reeds, M.D.   Dg Chest Port 1 View  11/24/2011  *RADIOLOGY REPORT*  Clinical Data: Shortness of breath  PORTABLE CHEST - 1 VIEW  Comparison: 07/26/2011  Findings: Cardiomegaly.  Central vascular congestion.  Interstitial and mild perihilar airspace opacities.  No pneumothorax.  Mild retrocardiac opacity.  Mildly tortuous and ectatic thoracic aorta. The right humeral head is subluxed inferiorly.  This may be positional however correlate with range of motion.  IMPRESSION: Cardiomegaly with pulmonary edema pattern.  Subluxation of the right humeral head, may be positional however correlate with range of motion.  Original Report Authenticated By: Waneta Martins, M.D.     Medications: I have reviewed the patient's current medications.  Allergies  Allergen Reactions  . Daptomycin Other (See Comments)    Elevated CPK  .  Morphine And Related Nausea And Vomiting  . Keflex Rash  . Celecoxib   . Codeine   . Fluoxetine Hcl   . Latex     REACTION: undefined  . Ofloxacin  unknown  . Penicillins   . Rofecoxib   . Sulfonamide Derivatives      Current Facility-Administered Medications  Medication Dose Route Frequency Provider Last Rate Last Dose  . 0.45 % sodium chloride infusion   Intravenous Continuous Larina Bras, NP      . albuterol (PROVENTIL HFA;VENTOLIN HFA) 108 (90 BASE) MCG/ACT inhaler 2 puff  2 puff Inhalation Q4H PRN [place] Siren   2 puff at 11/24/11 0818  . albuterol (PROVENTIL) (5 MG/ML) 0.5% nebulizer solution 2.5 mg  2.5 mg Nebulization Q2H PRN Michelle A. Ashley Royalty      . aspirin EC tablet 81 mg  81 mg Oral Daily Peter Le   81 mg at 11/25/11 1055  . digoxin (LANOXIN) tablet 125 mcg  125 mcg Oral Daily [place] Siren   125 mcg at 11/25/11 1232  . diltiazem (CARDIZEM CD) 24 hr capsule 360 mg  360 mg Oral Daily Peter Le   360 mg at 11/25/11 1055  . fluticasone (FLOVENT HFA) 220 MCG/ACT inhaler 2 puff  2 puff Inhalation TID [place] Siren   2 puff at 11/25/11 1600  . furosemide (LASIX) injection 80 mg  80 mg Intravenous BID Michelle A. Matthews   80 mg at 11/25/11 0857  . hydrALAZINE (APRESOLINE) injection 10 mg  10 mg Intravenous Q6H PRN Rolan Lipa   10 mg at 11/24/11 2224  . HYDROmorphone (DILAUDID) injection 1 mg  1 mg Intravenous Q4H PRN [place] Siren      . insulin aspart (novoLOG) injection 0-20 Units  0-20 Units Subcutaneous Q4H Peter Le   3 Units at 11/25/11 1714  . metolazone (ZAROXOLYN) tablet 2.5 mg  2.5 mg Oral Daily Michelle A. Matthews   2.5 mg at 11/25/11 1055  . metoprolol (LOPRESSOR) tablet 50 mg  50 mg Oral BID Peter Le   50 mg at 11/24/11 1045  . milk and molasses enema   Rectal Once Larina Bras, NP      . nystatin (MYCOSTATIN) 100000 UNIT/ML suspension 500,000 Units  5 mL Oral QID Larina Bras, NP      . OLANZapine (ZYPREXA) tablet 5 mg  5 mg Oral QHS Peter Le   5 mg  at 11/24/11 2224  . ondansetron (ZOFRAN) tablet 4 mg  4 mg Oral Q6H PRN [place] Siren       Or  . ondansetron [facility]) injection 4 mg  4 mg Intravenous Q6H PRN [place] Siren      . rosuvastatin (CRESTOR) tablet 20 mg  20 mg Oral Daily Peter Le   20 mg at 11/25/11 1055  . spironolactone (ALDACTONE) tablet 25 mg  25 mg Oral Daily Michelle A. Matthews   25 mg at 11/25/11 1055  . warfarin (COUMADIN) tablet 3 mg  3 mg Oral Once Theda Sers, PHARMD   3 mg at 11/24/11 1952  . warfarin (COUMADIN) tablet 5 mg  5 mg Oral ONCE-1800 Reece Packer, [place]      . DISCONTD: 0.9 %  sodium chloride infusion   Intravenous Continuous Rolan Lipa 20 mL/hr at 11/24/11 1757    . DISCONTD: warfarin (COUMADIN) injection 3 mg  3 mg Intravenous ONCE-1800 Michelle A. Ashley Royalty        Assessment/Plan: Patient Active Problem List  Diagnoses  . HYPERLIPIDEMIA  . OBSTRUCTIVE SLEEP APNEA  . HYPERTENSION  . ATRIAL FIBRILLATION WITH RAPID VENTRICULAR RESPONSE  . Acute combined systolic and diastolic heart failure  . 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 BRONCHITIS, RECURRENT  .  ASTHMA  . Obesity hypoventilation syndrome  . Prosthetic joint infection  . Group B streptococcal infection  . Gout  . Yeast infection involving the vagina and surrounding area  . SBO (small bowel obstruction)  . DM (diabetes mellitus)  . Chronic anticoagulation  . Oral candidiasis    Small Bowel Obstruction:  Continue current therapy of bowel rest, clear liquid diet.  The patient is also scheduled to receive an enema.  Diet will be advanced slowly.  Oral Candidiasis:  The patient will receive oral nystatin QID  Diabetes Mellitus:  The patient has scheduled blood glucose monitoring in addition to SSI protocol - Resistant Scale  Chronic Anticoagulation:  Pharmacy is managing   Pulmonary Edema/HTN:  Continue current regimen including medications, oxygen therapy and nebulizer treatments  Sleep Apnea:  The patient wears a CPAP  nocturnally  Discussed and agreed upon plan of care with the patient.   The plan of care will be adjusted based on the patient's clinical progress.   Larina Bras, NP-C 11/25/2011, 5:55 PM

## 2011-11-26 LAB — COMPREHENSIVE METABOLIC PANEL
ALT: 7 U/L (ref 0–35)
AST: 11 U/L (ref 0–37)
Albumin: 2.7 g/dL — ABNORMAL LOW (ref 3.5–5.2)
Alkaline Phosphatase: 87 U/L (ref 39–117)
BUN: 17 mg/dL (ref 6–23)
CO2: 34 mEq/L — ABNORMAL HIGH (ref 19–32)
Calcium: 8.5 mg/dL (ref 8.4–10.5)
Chloride: 98 mEq/L (ref 96–112)
Creatinine, Ser: 1.76 mg/dL — ABNORMAL HIGH (ref 0.50–1.10)
GFR calc Af Amer: 35 mL/min — ABNORMAL LOW (ref >90)
GFR calc non Af Amer: 30 mL/min — ABNORMAL LOW (ref >90)
Glucose, Bld: 115 mg/dL — ABNORMAL HIGH (ref 70–99)
Potassium: 3.6 mEq/L (ref 3.5–5.1)
Sodium: 138 mEq/L (ref 135–145)
Total Bilirubin: 0.3 mg/dL (ref 0.3–1.2)
Total Protein: 6.6 g/dL (ref 6.0–8.3)

## 2011-11-26 LAB — MAGNESIUM: Magnesium: 1.5 mg/dL (ref 1.5–2.5)

## 2011-11-26 LAB — GLUCOSE, CAPILLARY
Glucose-Capillary: 116 mg/dL — ABNORMAL HIGH (ref 70–99)
Glucose-Capillary: 106 mg/dL — ABNORMAL HIGH (ref 70–99)
Glucose-Capillary: 185 mg/dL — ABNORMAL HIGH (ref 70–99)
Glucose-Capillary: 154 mg/dL — ABNORMAL HIGH (ref 70–99)

## 2011-11-26 LAB — PROTIME-INR
INR: 2.36 — ABNORMAL HIGH (ref 0.00–1.49)
Prothrombin Time: 26.2 seconds — ABNORMAL HIGH (ref 11.6–15.2)

## 2011-11-26 LAB — CBC
MCH: 27.5 pg (ref 26.0–34.0)
MCHC: 31.3 g/dL (ref 30.0–36.0)
Platelets: 234 10*3/uL (ref 150–400)
RBC: 3.64 MIL/uL — ABNORMAL LOW (ref 3.87–5.11)

## 2011-11-26 LAB — LIPASE, BLOOD: Lipase: 17 U/L (ref 11–59)

## 2011-11-26 LAB — AMYLASE: Amylase: 50 U/L (ref 0–105)

## 2011-11-26 LAB — PRO B NATRIURETIC PEPTIDE: Pro B Natriuretic peptide (BNP): 564.5 pg/mL — ABNORMAL HIGH (ref 0–125)

## 2011-11-26 MED ORDER — WITCH HAZEL-GLYCERIN EX PADS
MEDICATED_PAD | CUTANEOUS | Status: DC | PRN
  Filled 2011-11-26: qty 100

## 2011-11-26 MED ORDER — WARFARIN SODIUM 4 MG PO TABS
8.0000 mg | ORAL_TABLET | Freq: Once | ORAL | Status: AC
  Administered 2011-11-26: 8 mg via ORAL
  Filled 2011-11-26: qty 2

## 2011-11-26 MED ORDER — RISAQUAD PO CAPS
1.0000 | ORAL_CAPSULE | Freq: Two times a day (BID) | ORAL | Status: DC
  Administered 2011-11-26 – 2011-11-29 (×7): 1 via ORAL
  Filled 2011-11-26 (×10): qty 1

## 2011-11-26 MED ORDER — LEVOFLOXACIN IN D5W 500 MG/100ML IV SOLN
500.0000 mg | INTRAVENOUS | Status: DC
  Administered 2011-11-26: 500 mg via INTRAVENOUS
  Filled 2011-11-26 (×2): qty 100

## 2011-11-26 MED ORDER — MAGNESIUM OXIDE 400 MG PO TABS
400.0000 mg | ORAL_TABLET | Freq: Two times a day (BID) | ORAL | Status: AC
  Administered 2011-11-26 (×2): 400 mg via ORAL
  Filled 2011-11-26 (×2): qty 1

## 2011-11-26 MED ORDER — ALLOPURINOL 100 MG PO TABS
100.0000 mg | ORAL_TABLET | Freq: Every day | ORAL | Status: DC
  Administered 2011-11-26 – 2011-11-29 (×4): 100 mg via ORAL
  Filled 2011-11-26 (×5): qty 1

## 2011-11-26 NOTE — Progress Notes (Signed)
Subjective: The patient was seen on rounds today.  The patient was sitting quietly in the reclining chair watching television and eating lunch.  The patient has had a few episodes of loose stools without receiving an enema or laxative. The patient has not had any emesis and she does have flatus.  The patient states that she is currently pain free, but does experience some mid-abdominal pain with palpation. The patient's diet has been advanced to a full liquid diet for lunch, and if tolerated, a bland diet for dinner.  The patient is also complaining of some rectal and vaginal discomfort - Tucks pads have been ordered for the discomfort in both areas in addition to peri-care TID.   The patient is on chronic antibiotic therapy - pharmacy consulted to manage Levaquin due to slight renal impairment.   No other nursing or patient concerns.   Objective: Vital signs in last 24 hours: Blood pressure 158/67, pulse 62, temperature 98.7 F (37.1 C), temperature source Axillary, resp. rate 20, height 5\' 3"  (1.6 m), weight 285 lb 15 oz (129.7 kg), SpO2 91.00%.  General appearance: Alert, cooperative, no distress and morbidly obese Head: Normocephalic, without obvious abnormality, atraumatic Eyes: Conjunctivae/corneas clear. PERRL, EOM's intact Nose: Nares normal. Septum midline. Mucosa normal. No drainage or sinus tenderness. Throat: oral candidiasis Resp: Diminished breath sounds bibasilar Cardio: Regular rate and rhythm, S1, S2 normal, no murmur, click, rub or gallop GI: Soft, excessive body habitus, mid abdominal tenderness, normoactive bowel sounds Extremities: +3 LE Edema,  bilateral LE hyperpigmentation and venous stasis dermatitis/anasarca noted Pulses: +1 bilateral LE Skin: Bilateral LE venous stasis/anasarca, well healed scars on trunk and extremities Neurologic: Grossly normal, no focal deficits, CN II - XII intact Psych:  Appropriate affect  Lab Results: Results for orders placed during the  hospital encounter of 11/22/11 (from the past 24 hour(s))  GLUCOSE, CAPILLARY     Status: Abnormal   Collection Time   11/25/11 12:11 PM      Component Value Range   Glucose-Capillary 140 (*) 70 - 99 (mg/dL)  GLUCOSE, CAPILLARY     Status: Abnormal   Collection Time   11/25/11  4:42 PM      Component Value Range   Glucose-Capillary 127 (*) 70 - 99 (mg/dL)  GLUCOSE, CAPILLARY     Status: Abnormal   Collection Time   11/25/11  8:18 PM      Component Value Range   Glucose-Capillary 162 (*) 70 - 99 (mg/dL)   Comment 1 Notify RN     Comment 2 Documented in Chart    GLUCOSE, CAPILLARY     Status: Abnormal   Collection Time   11/25/11 11:55 PM      Component Value Range   Glucose-Capillary 118 (*) 70 - 99 (mg/dL)  GLUCOSE, CAPILLARY     Status: Abnormal   Collection Time   11/26/11  3:52 AM      Component Value Range   Glucose-Capillary 116 (*) 70 - 99 (mg/dL)  PROTIME-INR     Status: Abnormal   Collection Time   11/26/11  4:09 AM      Component Value Range   Prothrombin Time 26.2 (*) 11.6 - 15.2 (seconds)   INR 2.36 (*) 0.00 - 1.49   MAGNESIUM     Status: Normal   Collection Time   11/26/11  4:09 AM      Component Value Range   Magnesium 1.5  1.5 - 2.5 (mg/dL)  PRO B NATRIURETIC PEPTIDE  Status: Abnormal   Collection Time   11/26/11  4:09 AM      Component Value Range   Pro B Natriuretic peptide (BNP) 564.5 (*) 0 - 125 (pg/mL)  LIPASE, BLOOD     Status: Normal   Collection Time   11/26/11  4:09 AM      Component Value Range   Lipase 17  11 - 59 (U/L)  AMYLASE     Status: Normal   Collection Time   11/26/11  4:09 AM      Component Value Range   Amylase 50  0 - 105 (U/L)  CBC     Status: Abnormal   Collection Time   11/26/11  4:09 AM      Component Value Range   WBC 6.6  4.0 - 10.5 (K/uL)   RBC 3.64 (*) 3.87 - 5.11 (MIL/uL)   Hemoglobin 10.0 (*) 12.0 - 15.0 (g/dL)   HCT 16.1 (*) 09.6 - 46.0 (%)   MCV 87.6  78.0 - 100.0 (fL)   MCH 27.5  26.0 - 34.0 (pg)    MCHC 31.3  30.0 - 36.0 (g/dL)   RDW 04.5 (*) 40.9 - 15.5 (%)   Platelets 234  150 - 400 (K/uL)  COMPREHENSIVE METABOLIC PANEL     Status: Abnormal   Collection Time   11/26/11  4:09 AM      Component Value Range   Sodium 138  135 - 145 (mEq/L)   Potassium 3.6  3.5 - 5.1 (mEq/L)   Chloride 98  96 - 112 (mEq/L)   CO2 34 (*) 19 - 32 (mEq/L)   Glucose, Bld 115 (*) 70 - 99 (mg/dL)   BUN 17  6 - 23 (mg/dL)   Creatinine, Ser 8.11 (*) 0.50 - 1.10 (mg/dL)   Calcium 8.5  8.4 - 91.4 (mg/dL)   Total Protein 6.6  6.0 - 8.3 (g/dL)   Albumin 2.7 (*) 3.5 - 5.2 (g/dL)   AST 11  0 - 37 (U/L)   ALT 7  0 - 35 (U/L)   Alkaline Phosphatase 87  39 - 117 (U/L)   Total Bilirubin 0.3  0.3 - 1.2 (mg/dL)   GFR calc non Af Amer 30 (*) >90 (mL/min)   GFR calc Af Amer 35 (*) >90 (mL/min)  PHOSPHORUS     Status: Normal   Collection Time   11/26/11  4:09 AM      Component Value Range   Phosphorus 3.5  2.3 - 4.6 (mg/dL)  GLUCOSE, CAPILLARY     Status: Abnormal   Collection Time   11/26/11  7:41 AM      Component Value Range   Glucose-Capillary 106 (*) 70 - 99 (mg/dL)     Studies/Results: Dg Abd 1 View  11/24/2011  *RADIOLOGY REPORT*  Clinical Data: Evaluate small bowel obstruction  ABDOMEN - 1 VIEW  Comparison: Abdominal CT - 11/23/2011  Findings: Examination is markedly degraded secondary to portable technique and patient body habitus necessitating multiple abdominal radiographs.  The right upper abdominal quadrant was excluded from view.  There is an overall paucity of bowel gas.  Nondiagnostic evaluation for pneumoperitoneum.  Postsurgical change of the lower lumbar spine.  IMPRESSION: Markedly degraded examination with overall paucity of bowel gas.  Original Report Authenticated By: Waynard Reeds, M.D.     Medications: I have reviewed the patient's current medications.  Allergies  Allergen Reactions  . Daptomycin Other (See Comments)    Elevated CPK  . Morphine And Related Nausea  And Vomiting    . Keflex Rash  . Celecoxib   . Codeine   . Fluoxetine Hcl   . Latex     REACTION: undefined  . Ofloxacin     unknown  . Penicillins   . Rofecoxib   . Sulfonamide Derivatives      Current Facility-Administered Medications  Medication Dose Route Frequency Provider Last Rate Last Dose  . 0.45 % sodium chloride infusion   Intravenous Continuous Larina Bras, NP 20 mL/hr at 11/25/11 1829 20 mL/hr at 11/25/11 1829  . acidophilus (RISAQUAD) capsule 1 capsule  1 capsule Oral BID Larina Bras, NP      . albuterol (PROVENTIL HFA;VENTOLIN HFA) 108 (90 BASE) MCG/ACT inhaler 2 puff  2 puff Inhalation Q4H PRN [place] Siren   2 puff at 11/24/11 0818  . albuterol (PROVENTIL) (5 MG/ML) 0.5% nebulizer solution 2.5 mg  2.5 mg Nebulization Q2H PRN Michelle A. Ashley Royalty      . allopurinol (ZYLOPRIM) tablet 100 mg  100 mg Oral Daily Larina Bras, NP      . aspirin EC tablet 81 mg  81 mg Oral Daily Peter Le   81 mg at 11/26/11 0950  . digoxin (LANOXIN) tablet 125 mcg  125 mcg Oral Daily Peter Le   125 mcg at 11/26/11 0951  . diltiazem (CARDIZEM CD) 24 hr capsule 360 mg  360 mg Oral Daily Peter Le   360 mg at 11/26/11 0950  . docusate sodium (COLACE) capsule 100 mg  100 mg Oral BID Larina Bras, NP   100 mg at 11/26/11 0951  . fluticasone (FLOVENT HFA) 220 MCG/ACT inhaler 2 puff  2 puff Inhalation TID [place] Siren   2 puff at 11/26/11 1129  . furosemide (LASIX) injection 80 mg  80 mg Intravenous BID Michelle A. Matthews   80 mg at 11/26/11 0855  . hydrALAZINE (APRESOLINE) injection 10 mg  10 mg Intravenous Q6H PRN Rolan Lipa   10 mg at 11/24/11 2224  . HYDROmorphone (DILAUDID) injection 1 mg  1 mg Intravenous Q4H PRN [place] Siren      . insulin aspart (novoLOG) injection 0-20 Units  0-20 Units Subcutaneous Q4H [place] Siren   4 Units at 11/25/11 2031  . magnesium oxide (MAG-OX) tablet 400 mg  400 mg Oral BID Larina Bras, NP   400 mg at 11/26/11 1217  . metolazone (ZAROXOLYN) tablet 2.5 mg   2.5 mg Oral Daily Michelle A. Matthews   2.5 mg at 11/26/11 0950  . metoprolol (LOPRESSOR) tablet 50 mg  50 mg Oral BID Peter Le   50 mg at 11/26/11 0950  . milk and molasses enema   Rectal Once Larina Bras, NP      . nystatin (MYCOSTATIN) 100000 UNIT/ML suspension 500,000 Units  5 mL Oral QID Larina Bras, NP   500,000 Units at 11/26/11 0951  . OLANZapine (ZYPREXA) tablet 5 mg  5 mg Oral QHS Peter Le   5 mg at 11/25/11 2146  . ondansetron (ZOFRAN) tablet 4 mg  4 mg Oral Q6H PRN [place] Siren       Or  . ondansetron [facility]) injection 4 mg  4 mg Intravenous Q6H PRN [place] Siren      . rosuvastatin (CRESTOR) tablet 20 mg  20 mg Oral Daily Peter Le   20 mg at 11/26/11 0951  . spironolactone (ALDACTONE) tablet 25 mg  25 mg Oral Daily Michelle A. Matthews   25 mg at 11/26/11 0951  . warfarin (COUMADIN) tablet 5  mg  5 mg Oral ONCE-1800 Reece Packer, [place]   5 mg at 11/25/11 1823  . warfarin (COUMADIN) tablet 8 mg  8 mg Oral ONCE-1800 Willey Blade, MD      . witch hazel-glycerin (TUCKS) pad   Topical PRN Larina Bras, NP      . DISCONTD: 0.9 %  sodium chloride infusion   Intravenous Continuous Rolan Lipa 20 mL/hr at 11/24/11 1757      Assessment/Plan: Patient Active Problem List  Diagnoses  . HYPERLIPIDEMIA  . OBSTRUCTIVE SLEEP APNEA  . HYPERTENSION  . ATRIAL FIBRILLATION WITH RAPID VENTRICULAR RESPONSE  . Acute combined systolic and diastolic heart failure  . 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 BRONCHITIS, RECURRENT  . ASTHMA  . Obesity hypoventilation syndrome  . Prosthetic joint infection  . Group B streptococcal infection  . Gout  . Yeast infection involving the vagina and surrounding area  . SBO (small bowel obstruction)  . DM (diabetes mellitus)  . Chronic anticoagulation  . Oral candidiasis    Small Bowel Obstruction:  Continue current therapy, patient's diet is being advanced slowly.  The patient is scheduled to have a repeat abdominal x-ray in the am.  Oral  Candidiasis:  The patient is receiving oral nystatin QID  Diabetes Mellitus:  The patient has scheduled blood glucose monitoring in addition to SSI protocol - Resistant Scale  Chronic Anticoagulation:  Pharmacy is managing   Pulmonary Edema/HTN:  Continue current regimen including medications, oxygen therapy and nebulizer treatments  Sleep Apnea:  The patient wears a CPAP nocturnally  Discussed and agreed upon plan of care with the patient.   The plan of care will be adjusted based on the patient's clinical progress.   Larina Bras, NP-C 11/26/2011, 12:08 PM

## 2011-11-26 NOTE — Progress Notes (Addendum)
ANTIBIOTIC CONSULT NOTE - INITIAL  Pharmacy Consult for Levaquin Indication: UTI  Allergies  Allergen Reactions  . Daptomycin Other (See Comments)    Elevated CPK  . Morphine And Related Nausea And Vomiting  . Keflex Rash  . Celecoxib   . Codeine   . Fluoxetine Hcl   . Latex     REACTION: undefined  . Ofloxacin     unknown  . Penicillins   . Rofecoxib   . Sulfonamide Derivatives     Patient Measurements: Height: 5\' 3"  (160 cm) Weight: 285 lb 15 oz (129.7 kg) IBW/kg (Calculated) : 52.4  Adjusted Body Weight:   Vital Signs: Temp: 98.7 F (37.1 C) (12/18 0600) Temp src: Axillary (12/18 0600) BP: 158/67 mmHg (12/18 0600) Pulse Rate: 62  (12/18 0950) Intake/Output from previous day: 12/17 0701 - 12/18 0700 In: 1542 [P.O.:1300; I.V.:242] Out: 3602 [Urine:3600; Stool:2] Intake/Output from this shift: Total I/O In: -  Out: 1002 [Urine:1000; Stool:2]  Labs:  [facility] 11/26/11 0409 11/25/11 0423 11/24/11 0425  WBC 6.6 7.3 7.1  HGB 10.0* 9.6* 9.9*  PLT 234 233 250  LABCREA -- -- --  CREATININE 1.76* 1.73* 1.58*   Estimated Creatinine Clearance: 44.1 ml/min (by C-G formula based on Cr of 1.76).   Microbiology: Recent Results (from the past 720 hour(s))  MRSA PCR SCREENING     Status: Normal   Collection Time   11/23/11  5:24 AM      Component Value Range Status Comment   MRSA by PCR NEGATIVE  NEGATIVE  Final     Medical History: Past Medical History  Diagnosis Date  . Asthma   . Bronchitis   . Allergic rhinitis   . Diabetes mellitus   . Stroke   . HTN (hypertension)   . OSA (obstructive sleep apnea)   . DJD (degenerative joint disease)   . Kidney stones   . Depression   . Hyperlipidemia   . Complication of anesthesia     trouble waking up    Medications:  Scheduled:    . allopurinol  100 mg Oral Daily  . aspirin EC  81 mg Oral Daily  . digoxin  125 mcg Oral Daily  . diltiazem  360 mg Oral Daily  . docusate sodium  100 mg Oral BID  .  fluticasone  2 puff Inhalation TID  . furosemide  80 mg Intravenous BID  . insulin aspart  0-20 Units Subcutaneous Q4H  . magnesium oxide  400 mg Oral BID  . metolazone  2.5 mg Oral Daily  . metoprolol  50 mg Oral BID  . milk and molasses   Rectal Once  . nystatin  5 mL Oral QID  . OLANZapine  5 mg Oral QHS  . rosuvastatin  20 mg Oral Daily  . spironolactone  25 mg Oral Daily  . warfarin  5 mg Oral ONCE-1800  . warfarin  8 mg Oral ONCE-1800   Assessment: 60 yo female with presume UTI in the presence of foley catheter per discussion with NP.  Goal of Therapy:  Erradication of infection  Plan:   Levaquin 500mg  IV q24h  CrCl is borderline for dose redluction, will monitor closely and adjust if it worsens  Change to po as soon as possible   [name] 11/26/2011,12:26 PM Pager: [phone]  Addendum- 11/27/2011 7:46 AM Assessment: Patient's SCr continues to trend upward.  Will adjust Levaquin for CrCl ~ 39 ml/min with thought that CrCl will continue to decline. No culture data  to report. Pt is afebrile, WBC wnl. Blood in urinary catheter noted by RN.  Plan: Adjust Levaquin to 750mg  IV q48h.  Will follow SCr for further dose adjustments.  [name], PharmD Pager: [phone] 11/27/2011 7:49 AM

## 2011-11-26 NOTE — Progress Notes (Signed)
ANTICOAGULATION CONSULT NOTE - Initial Consult  Pharmacy Consult for Coumadin Indication: atrial fibrillation  Allergies  Allergen Reactions  . Daptomycin Other (See Comments)    Elevated CPK  . Morphine And Related Nausea And Vomiting  . Keflex Rash  . Celecoxib   . Codeine   . Fluoxetine Hcl   . Latex     REACTION: undefined  . Ofloxacin     unknown  . Penicillins   . Rofecoxib   . Sulfonamide Derivatives     Patient Measurements: Height: 5\' 3"  (160 cm) Weight: 285 lb 15 oz (129.7 kg) IBW/kg (Calculated) : 52.4  Adjusted Body Weight:   Vital Signs: Temp: 98.7 F (37.1 C) (12/18 0600) Temp src: Axillary (12/18 0600) BP: 158/67 mmHg (12/18 0600) Pulse Rate: 60  (12/18 0600)  Labs:  Basename 11/26/11 0409 11/25/11 1015 11/25/11 0423 11/24/11 1030 11/24/11 0425  HGB 10.0* -- 9.6* -- --  HCT 31.9* -- 31.5* -- 32.6*  PLT 234 -- 233 -- 250  APTT -- -- -- -- --  LABPROT 26.2* 26.4* -- 29.1* --  INR 2.36* 2.38* -- 2.70* --  HEPARINUNFRC -- -- -- -- --  CREATININE 1.76* -- 1.73* -- 1.58*  CKTOTAL -- -- -- -- --  CKMB -- -- -- -- --  TROPONINI -- -- -- -- --   Estimated Creatinine Clearance: 44.1 ml/min (by C-G formula based on Cr of 1.76).  Medical History: Past Medical History  Diagnosis Date  . Asthma   . Bronchitis   . Allergic rhinitis   . Diabetes mellitus   . Stroke   . HTN (hypertension)   . OSA (obstructive sleep apnea)   . DJD (degenerative joint disease)   . Kidney stones   . Depression   . Hyperlipidemia   . Complication of anesthesia     trouble waking up    Medications:  Prescriptions prior to admission  Medication Sig Dispense Refill  . albuterol (VENTOLIN HFA) 108 (90 BASE) MCG/ACT inhaler Inhale 2 puffs into the lungs every 6 (six) hours as needed.        [name] allopurinol (ZYLOPRIM) 100 MG tablet Take 1 tablet by mouth daily.      [name] aspirin 81 MG tablet Take 81 mg by mouth daily.        . clobetasol (TEMOVATE) 0.05 % ointment Use as  directed on legs      . digoxin (LANOXIN) 0.125 MG tablet Take 1 tablet by mouth daily.      [name] diltiazem (TIAZAC) 360 MG 24 hr capsule Take 360 mg by mouth daily.        . fluticasone (FLOVENT HFA) 220 MCG/ACT inhaler Inhale 2 puffs into the lungs 3 (three) times daily.        . furosemide (LASIX) 40 MG tablet Take 60 mg by mouth daily.        . GNP LANCETS MICRO THIN 33G MISC       . insulin aspart (NOVOLOG) 100 UNIT/ML injection Inject 2 Units into the skin 3 (three) times daily before meals. 2units - 8units sliding scale       . LANTUS 100 UNIT/ML injection 10 units at bedtime      . methotrexate (RHEUMATREX) 2.5 MG tablet Take 10 mg by mouth once a week. Caution:Chemotherapy. Protect from light.       . metolazone (ZAROXOLYN) 2.5 MG tablet Take 1 tablet by mouth Daily.      . metoprolol (LOPRESSOR) 50 MG tablet Take 1  tablet by mouth 2 (two) times daily.       [name] OLANZapine (ZYPREXA) 5 MG tablet Take 5 mg by mouth at bedtime.        . promethazine (PHENERGAN) 12.5 MG tablet Take 12.5 mg by mouth every 6 (six) hours as needed.        . rosuvastatin (CRESTOR) 20 MG tablet Take 20 mg by mouth daily.        [name] spironolactone (ALDACTONE) 25 MG tablet Take 25 mg by mouth daily.        [name] warfarin (COUMADIN) 4 MG tablet Take 8 mg by mouth daily.         Assessment:  60 y.o. female with history of Afib on chronic anticoag  Admitted with SBO  Home dose reported as 8mg  po daily  CL diet  Goal of Therapy:  INR 2-3   Plan:   Coumadin 8mg  today as at home  Daily PT/INR   [name] 11/26/2011,9:14 AM Pager: [phone]

## 2011-11-26 NOTE — Progress Notes (Signed)
Pt has foley catheter inserted x 2 days now. MD called r/t continuation of foley catheter. MD to put in order to continue foley until further evaluation. Pt is stable at this time and will continue to monitor status. Wilson Singer, Charity fundraiser, BSN

## 2011-11-26 NOTE — Progress Notes (Signed)
Pt complained of discomfort at urethra. Noticed blood in the urinary catheter. Foley balloon re inflated and new stat lock in place. Dr. [name] notified. Patient stated discomfort reduced. Foley starting to drain clear urine. Dr. [name] stated to continue to watch.

## 2011-11-27 ENCOUNTER — Inpatient Hospital Stay (HOSPITAL_COMMUNITY): Payer: Medicare HMO

## 2011-11-27 ENCOUNTER — Ambulatory Visit: Payer: Medicare HMO | Admitting: Infectious Disease

## 2011-11-27 LAB — GLUCOSE, CAPILLARY: Glucose-Capillary: 161 mg/dL — ABNORMAL HIGH (ref 70–99)

## 2011-11-27 LAB — PROTIME-INR: Prothrombin Time: 26.7 seconds — ABNORMAL HIGH (ref 11.6–15.2)

## 2011-11-27 LAB — CBC
HCT: 33 % — ABNORMAL LOW (ref 36.0–46.0)
Hemoglobin: 10 g/dL — ABNORMAL LOW (ref 12.0–15.0)
MCH: 27 pg (ref 26.0–34.0)
MCHC: 30.3 g/dL (ref 30.0–36.0)
RDW: 17.6 % — ABNORMAL HIGH (ref 11.5–15.5)

## 2011-11-27 LAB — COMPREHENSIVE METABOLIC PANEL
BUN: 22 mg/dL (ref 6–23)
Calcium: 8.4 mg/dL (ref 8.4–10.5)
GFR calc Af Amer: 30 mL/min — ABNORMAL LOW (ref >90)
Glucose, Bld: 153 mg/dL — ABNORMAL HIGH (ref 70–99)
Sodium: 139 mEq/L (ref 135–145)
Total Protein: 6.8 g/dL (ref 6.0–8.3)

## 2011-11-27 LAB — PRO B NATRIURETIC PEPTIDE: Pro B Natriuretic peptide (BNP): 561.8 pg/mL — ABNORMAL HIGH (ref 0–125)

## 2011-11-27 MED ORDER — POTASSIUM CHLORIDE CRYS ER 20 MEQ PO TBCR
40.0000 meq | EXTENDED_RELEASE_TABLET | Freq: Two times a day (BID) | ORAL | Status: DC
  Administered 2011-11-27 – 2011-11-29 (×4): 40 meq via ORAL
  Filled 2011-11-27 (×8): qty 2

## 2011-11-27 MED ORDER — WARFARIN SODIUM 6 MG PO TABS
6.0000 mg | ORAL_TABLET | Freq: Once | ORAL | Status: AC
  Administered 2011-11-27: 6 mg via ORAL
  Filled 2011-11-27: qty 1

## 2011-11-27 MED ORDER — LEVOFLOXACIN 750 MG PO TABS
750.0000 mg | ORAL_TABLET | ORAL | Status: DC
  Administered 2011-11-27 – 2011-11-29 (×2): 750 mg via ORAL
  Filled 2011-11-27 (×2): qty 1

## 2011-11-27 NOTE — Progress Notes (Signed)
This patient is receiving the antibiotic Levaquin by the intravenous route. Based on criteria approved by the Pharmacy and Therapeutics Committee, and the Infectious Disease Division, the antibiotic(s) is / are being converted to equivalent oral dose form(s). These criteria include: . Patient being treated for a respiratory tract infection, urinary tract infection, cellulitis, or Clostridium Difficile Associated Diarrhea . The patient is not neutropenic and does not exhibit a GI malabsorption state . The patient is eating (either orally or per tube) and/or has been taking other orally administered medications for at least 24 hours. . The patient is improving clinically (physician assessment and a 24-hour Tmax of < 100.5 F).  If you have questions about this conversion, please contact the pharmacy department. Thank you.  [name], PharmD Pager: [phone] 11/27/2011 7:53 AM

## 2011-11-27 NOTE — Progress Notes (Signed)
Subjective:  Patient reports feeling sluggish today. She denies chest pains or shortness of breath. There's been no nausea vomiting. Her bowel movements have been better. She has expressed occasional swelling in the legs. She has had support stockings in the past but has not been able to afford them recently. Gradually feeling as if she can manage herself at home.   Allergies  Allergen Reactions  . Daptomycin Other (See Comments)    Elevated CPK  . Morphine And Related Nausea And Vomiting  . Keflex Rash  . Celecoxib   . Codeine   . Fluoxetine Hcl   . Latex     REACTION: undefined  . Ofloxacin     unknown  . Penicillins   . Rofecoxib   . Sulfonamide Derivatives    Current Facility-Administered Medications  Medication Dose Route Frequency Provider Last Rate Last Dose  . 0.45 % sodium chloride infusion   Intravenous Continuous Larina Bras, NP 20 mL/hr at 11/25/11 1829 20 mL/hr at 11/25/11 1829  . acidophilus (RISAQUAD) capsule 1 capsule  1 capsule Oral BID Larina Bras, NP   1 capsule at 11/27/11 0816  . albuterol (PROVENTIL HFA;VENTOLIN HFA) 108 (90 BASE) MCG/ACT inhaler 2 puff  2 puff Inhalation Q4H PRN Houston Siren   2 puff at 11/24/11 0818  . albuterol (PROVENTIL) (5 MG/ML) 0.5% nebulizer solution 2.5 mg  2.5 mg Nebulization Q2H PRN Michelle A. [facility] [employer]      . allopurinol (ZYLOPRIM) tablet 100 mg  100 mg Oral Daily Larina Bras, NP   100 mg at 11/27/11 1045  . aspirin EC tablet 81 mg  81 mg Oral Daily Peter Le   81 mg at 11/27/11 1046  . digoxin (LANOXIN) tablet 125 mcg  125 mcg Oral Daily Peter Le   125 mcg at 11/27/11 1046  . diltiazem (CARDIZEM CD) 24 hr capsule 360 mg  360 mg Oral Daily Peter Le   360 mg at 11/27/11 1047  . docusate sodium (COLACE) capsule 100 mg  100 mg Oral BID Larina Bras, NP   100 mg at 11/27/11 1048  . fluticasone (FLOVENT HFA) 220 MCG/ACT inhaler 2 puff  2 puff Inhalation TID Houston Siren   2 puff at 11/27/11 2106  . furosemide (LASIX)  injection 80 mg  80 mg Intravenous BID Michelle A. Matthews   80 mg at 11/27/11 1813  . hydrALAZINE (APRESOLINE) injection 10 mg  10 mg Intravenous Q6H PRN Rolan Lipa   10 mg at 11/24/11 2224  . HYDROmorphone (DILAUDID) injection 1 mg  1 mg Intravenous Q4H PRN Houston Siren      . insulin aspart (novoLOG) injection 0-20 Units  0-20 Units Subcutaneous Q4H Houston Siren   4 Units at 11/27/11 2028  . levofloxacin (LEVAQUIN) tablet 750 mg  750 mg Oral Q48H Amanda Runyon, PHARMD   750 mg at 11/27/11 1502  . metolazone (ZAROXOLYN) tablet 2.5 mg  2.5 mg Oral Daily Michelle A. Matthews   2.5 mg at 11/27/11 1048  . metoprolol (LOPRESSOR) tablet 50 mg  50 mg Oral BID Peter Le   50 mg at 11/27/11 1048  . milk and molasses enema   Rectal Once Larina Bras, NP      . nystatin (MYCOSTATIN) 100000 UNIT/ML suspension 500,000 Units  5 mL Oral QID Larina Bras, NP   500,000 Units at 11/27/11 1814  . OLANZapine (ZYPREXA) tablet 5 mg  5 mg Oral QHS Peter Le   5 mg at 11/26/11 2128  . ondansetron (ZOFRAN) tablet  4 mg  4 mg Oral Q6H PRN Houston Siren       Or  . ondansetron [facility]) injection 4 mg  4 mg Intravenous Q6H PRN Houston Siren      . potassium chloride SA (K-DUR,KLOR-CON) CR tablet 40 mEq  40 mEq Oral BID Willey Blade, MD      . rosuvastatin (CRESTOR) tablet 20 mg  20 mg Oral Daily Peter Le   20 mg at 11/26/11 0951  . spironolactone (ALDACTONE) tablet 25 mg  25 mg Oral Daily Michelle A. Matthews   25 mg at 11/27/11 1204  . warfarin (COUMADIN) tablet 6 mg  6 mg Oral ONCE-1800 [name], PHARMD   6 mg at 11/27/11 1814  . witch hazel-glycerin (TUCKS) pad   Topical PRN Larina Bras, NP      . DISCONTD: levofloxacin (LEVAQUIN) IVPB 500 mg  500 mg Intravenous Q24H Willey Blade, MD   500 mg at 11/26/11 1332    Objective: Blood pressure 136/32, pulse 51, temperature 98.7 F (37.1 C), temperature source Oral, resp. rate 18, height 5\' 3"  (1.6 m), weight 285 lb 15 oz (129.7 kg), SpO2 95.00%.  Well-developed  obese black female in no acute distress. HEENT:no sinus tenderness. NECK:no posterior cervical nodes. LUNGS:no expiratory wheezes appreciated. No vocal fremitus. HY:QMVHQI S1, S2 without S3. ONG:EXBM epigastric tenderness. Dullness in the left lower quadrant. WUX:LKGMW to 1+ edema. Negative Homans. NEURO:neurologically intact.  Lab results: Results for orders placed during the hospital encounter of 11/22/11 (from the past 48 hour(s))  GLUCOSE, CAPILLARY     Status: Abnormal   Collection Time   11/25/11 11:55 PM      Component Value Range Comment   Glucose-Capillary 118 (*) 70 - 99 (mg/dL)   GLUCOSE, CAPILLARY     Status: Abnormal   Collection Time   11/26/11  3:52 AM      Component Value Range Comment   Glucose-Capillary 116 (*) 70 - 99 (mg/dL)   PROTIME-INR     Status: Abnormal   Collection Time   11/26/11  4:09 AM      Component Value Range Comment   Prothrombin Time 26.2 (*) 11.6 - 15.2 (seconds)    INR 2.36 (*) 0.00 - 1.49    MAGNESIUM     Status: Normal   Collection Time   11/26/11  4:09 AM      Component Value Range Comment   Magnesium 1.5  1.5 - 2.5 (mg/dL)   PRO B NATRIURETIC PEPTIDE     Status: Abnormal   Collection Time   11/26/11  4:09 AM      Component Value Range Comment   Pro B Natriuretic peptide (BNP) 564.5 (*) 0 - 125 (pg/mL)   LIPASE, BLOOD     Status: Normal   Collection Time   11/26/11  4:09 AM      Component Value Range Comment   Lipase 17  11 - 59 (U/L)   AMYLASE     Status: Normal   Collection Time   11/26/11  4:09 AM      Component Value Range Comment   Amylase 50  0 - 105 (U/L)   CBC     Status: Abnormal   Collection Time   11/26/11  4:09 AM      Component Value Range Comment   WBC 6.6  4.0 - 10.5 (K/uL)    RBC 3.64 (*) 3.87 - 5.11 (MIL/uL)    Hemoglobin 10.0 (*) 12.0 - 15.0 (g/dL)    HCT  31.9 (*) 36.0 - 46.0 (%)    MCV 87.6  78.0 - 100.0 (fL)    MCH 27.5  26.0 - 34.0 (pg)    MCHC 31.3  30.0 - 36.0 (g/dL)    RDW 45.4 (*) 09.8 - 15.5  (%)    Platelets 234  150 - 400 (K/uL)   COMPREHENSIVE METABOLIC PANEL     Status: Abnormal   Collection Time   11/26/11  4:09 AM      Component Value Range Comment   Sodium 138  135 - 145 (mEq/L)    Potassium 3.6  3.5 - 5.1 (mEq/L)    Chloride 98  96 - 112 (mEq/L)    CO2 34 (*) 19 - 32 (mEq/L)    Glucose, Bld 115 (*) 70 - 99 (mg/dL)    BUN 17  6 - 23 (mg/dL)    Creatinine, Ser 1.19 (*) 0.50 - 1.10 (mg/dL)    Calcium 8.5  8.4 - 10.5 (mg/dL)    Total Protein 6.6  6.0 - 8.3 (g/dL)    Albumin 2.7 (*) 3.5 - 5.2 (g/dL)    AST 11  0 - 37 (U/L)    ALT 7  0 - 35 (U/L)    Alkaline Phosphatase 87  39 - 117 (U/L)    Total Bilirubin 0.3  0.3 - 1.2 (mg/dL)    GFR calc non Af Amer 30 (*) >90 (mL/min)    GFR calc Af Amer 35 (*) >90 (mL/min)   PHOSPHORUS     Status: Normal   Collection Time   11/26/11  4:09 AM      Component Value Range Comment   Phosphorus 3.5  2.3 - 4.6 (mg/dL)   GLUCOSE, CAPILLARY     Status: Abnormal   Collection Time   11/26/11  7:41 AM      Component Value Range Comment   Glucose-Capillary 106 (*) 70 - 99 (mg/dL)   GLUCOSE, CAPILLARY     Status: Abnormal   Collection Time   11/26/11  1:48 PM      Component Value Range Comment   Glucose-Capillary 185 (*) 70 - 99 (mg/dL)   GLUCOSE, CAPILLARY     Status: Abnormal   Collection Time   11/26/11  4:08 PM      Component Value Range Comment   Glucose-Capillary 139 (*) 70 - 99 (mg/dL)   GLUCOSE, CAPILLARY     Status: Abnormal   Collection Time   11/26/11  8:07 PM      Component Value Range Comment   Glucose-Capillary 154 (*) 70 - 99 (mg/dL)    Comment 1 Notify RN     GLUCOSE, CAPILLARY     Status: Abnormal   Collection Time   11/27/11 12:07 AM      Component Value Range Comment   Glucose-Capillary 137 (*) 70 - 99 (mg/dL)   PROTIME-INR     Status: Abnormal   Collection Time   11/27/11  3:55 AM      Component Value Range Comment   Prothrombin Time 26.7 (*) 11.6 - 15.2 (seconds)    INR 2.42 (*) 0.00 - 1.49    CBC      Status: Abnormal   Collection Time   11/27/11  3:55 AM      Component Value Range Comment   WBC 8.0  4.0 - 10.5 (K/uL)    RBC 3.71 (*) 3.87 - 5.11 (MIL/uL)    Hemoglobin 10.0 (*) 12.0 - 15.0 (g/dL)    HCT 14.7 (*)  36.0 - 46.0 (%)    MCV 88.9  78.0 - 100.0 (fL)    MCH 27.0  26.0 - 34.0 (pg)    MCHC 30.3  30.0 - 36.0 (g/dL)    RDW 96.0 (*) 45.4 - 15.5 (%)    Platelets 261  150 - 400 (K/uL)   COMPREHENSIVE METABOLIC PANEL     Status: Abnormal   Collection Time   12/22/2011  3:55 AM      Component Value Range Comment   Sodium 139  135 - 145 (mEq/L)    Potassium 3.2 (*) 3.5 - 5.1 (mEq/L)    Chloride 96  96 - 112 (mEq/L)    CO2 34 (*) 19 - 32 (mEq/L)    Glucose, Bld 153 (*) 70 - 99 (mg/dL)    BUN 22  6 - 23 (mg/dL)    Creatinine, Ser 0.98 (*) 0.50 - 1.10 (mg/dL)    Calcium 8.4  8.4 - 10.5 (mg/dL)    Total Protein 6.8  6.0 - 8.3 (g/dL)    Albumin 2.7 (*) 3.5 - 5.2 (g/dL)    AST 11  0 - 37 (U/L)    ALT 7  0 - 35 (U/L)    Alkaline Phosphatase 89  39 - 117 (U/L)    Total Bilirubin 0.3  0.3 - 1.2 (mg/dL)    GFR calc non Af Amer 26 (*) >90 (mL/min)    GFR calc Af Amer 30 (*) >90 (mL/min)   PRO B NATRIURETIC PEPTIDE     Status: Abnormal   Collection Time   22-Dec-2011  3:55 AM      Component Value Range Comment   Pro B Natriuretic peptide (BNP) 561.8 (*) 0 - 125 (pg/mL)   MAGNESIUM     Status: Normal   Collection Time   12/22/11  3:55 AM      Component Value Range Comment   Magnesium 1.6  1.5 - 2.5 (mg/dL)   GLUCOSE, CAPILLARY     Status: Abnormal   Collection Time   22-Dec-2011  3:57 AM      Component Value Range Comment   Glucose-Capillary 168 (*) 70 - 99 (mg/dL)   GLUCOSE, CAPILLARY     Status: Abnormal   Collection Time   December 22, 2011  7:50 AM      Component Value Range Comment   Glucose-Capillary 110 (*) 70 - 99 (mg/dL)   GLUCOSE, CAPILLARY     Status: Abnormal   Collection Time   12/22/11 12:12 PM      Component Value Range Comment   Glucose-Capillary 144 (*) 70 - 99 (mg/dL)    GLUCOSE, CAPILLARY     Status: Abnormal   Collection Time   12/22/11  4:34 PM      Component Value Range Comment   Glucose-Capillary 158 (*) 70 - 99 (mg/dL)   GLUCOSE, CAPILLARY     Status: Abnormal   Collection Time   12/22/2011  8:11 PM      Component Value Range Comment   Glucose-Capillary 161 (*) 70 - 99 (mg/dL)    Comment 1 Notify RN       Studies/Results: Dg Abd 2 Views  2011-12-22  *RADIOLOGY REPORT*  Clinical Data: Abdominal distention.  Abdominal pain.  Recent partial small bowel obstruction.  ABDOMEN - 2 VIEW  Comparison: Radiographs dated 12/16 and 06/02/2011 and CT scan dated 11/23/2011  Findings: There is air scattered throughout nondistended loops of large and small bowel.  No focal small bowel dilatation.  No free air.  Evidence of previous lower lumbar fusion.  IMPRESSION: No evidence of obstruction or residual bowel dilatation.  Original Report Authenticated By: Gwynn Burly, M.D.    Patient Active Problem List  Diagnoses  . HYPERLIPIDEMIA  . OBSTRUCTIVE SLEEP APNEA  . HYPERTENSION  . ATRIAL FIBRILLATION WITH RAPID VENTRICULAR RESPONSE  . Acute combined systolic and diastolic heart failure  . 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 BRONCHITIS, RECURRENT  . ASTHMA  . Obesity hypoventilation syndrome  . Prosthetic joint infection  . Group B streptococcal infection  . Gout  . Yeast infection involving the vagina and surrounding area  . SBO (small bowel obstruction)  . DM (diabetes mellitus)  . Chronic anticoagulation  . Oral candidiasis    Impression: 1. Resolving small bowel obstruction. 2. Atrial fibrillation psychomotor with history of rapid ventricular response.. 3. Morbid obesity with hypoventilation syndrome. 4. Diabetes mellitus. 5. Acute combined systolic and diastolic congestive heart failure. 6. Hypokalemia secondary to diuresis. 7. Transient hematuria. Improved. 8. Chronic anticoagulation presently therapeutic.    Plan: 1. Replenish potassium. 2. Continue  to advance diet. Ambulate as tolerated.Support stockings. 3. Followup CMET and proBNP in a.m. 4. Discharge home in 24-48 hours.   [name], Nastasia Kage 11/27/2011 9:44 PM

## 2011-11-27 NOTE — Progress Notes (Signed)
ANTICOAGULATION CONSULT NOTE - Initial Consult  Pharmacy Consult for Coumadin Indication: atrial fibrillation  Allergies  Allergen Reactions  . Daptomycin Other (See Comments)    Elevated CPK  . Morphine And Related Nausea And Vomiting  . Keflex Rash  . Celecoxib   . Codeine   . Fluoxetine Hcl   . Latex     REACTION: undefined  . Ofloxacin     unknown  . Penicillins   . Rofecoxib   . Sulfonamide Derivatives     Patient Measurements: Height: 5\' 3"  (160 cm) Weight: 285 lb 15 oz (129.7 kg) IBW/kg (Calculated) : 52.4  Adjusted Body Weight:   Vital Signs: Temp: 98.3 F (36.8 C) (12/19 0642) Temp src: Oral (12/19 0642) BP: 165/70 mmHg (12/19 0642) Pulse Rate: 60  (12/19 0642)  Labs:  Basename 11/27/11 0355 11/26/11 0409 11/25/11 1015 11/25/11 0423  HGB 10.0* 10.0* -- --  HCT 33.0* 31.9* -- 31.5*  PLT 261 234 -- 233  APTT -- -- -- --  LABPROT 26.7* 26.2* 26.4* --  INR 2.42* 2.36* 2.38* --  HEPARINUNFRC -- -- -- --  CREATININE 1.98* 1.76* -- 1.73*  CKTOTAL -- -- -- --  CKMB -- -- -- --  TROPONINI -- -- -- --   Estimated Creatinine Clearance: 39.2 ml/min (by C-G formula based on Cr of 1.98).  Medical History: Past Medical History  Diagnosis Date  . Asthma   . Bronchitis   . Allergic rhinitis   . Diabetes mellitus   . Stroke   . HTN (hypertension)   . OSA (obstructive sleep apnea)   . DJD (degenerative joint disease)   . Kidney stones   . Depression   . Hyperlipidemia   . Complication of anesthesia     trouble waking up    Medications:  Prescriptions prior to admission  Medication Sig Dispense Refill  . albuterol (VENTOLIN HFA) 108 (90 BASE) MCG/ACT inhaler Inhale 2 puffs into the lungs every 6 (six) hours as needed.        [name] allopurinol (ZYLOPRIM) 100 MG tablet Take 1 tablet by mouth daily.      [name] aspirin 81 MG tablet Take 81 mg by mouth daily.        . clobetasol (TEMOVATE) 0.05 % ointment Use as directed on legs      . digoxin (LANOXIN) 0.125 MG  tablet Take 1 tablet by mouth daily.      [name] diltiazem (TIAZAC) 360 MG 24 hr capsule Take 360 mg by mouth daily.        . fluticasone (FLOVENT HFA) 220 MCG/ACT inhaler Inhale 2 puffs into the lungs 3 (three) times daily.        . furosemide (LASIX) 40 MG tablet Take 60 mg by mouth daily.        . GNP LANCETS MICRO THIN 33G MISC       . insulin aspart (NOVOLOG) 100 UNIT/ML injection Inject 2 Units into the skin 3 (three) times daily before meals. 2units - 8units sliding scale       . LANTUS 100 UNIT/ML injection 10 units at bedtime      . methotrexate (RHEUMATREX) 2.5 MG tablet Take 10 mg by mouth once a week. Caution:Chemotherapy. Protect from light.       . metolazone (ZAROXOLYN) 2.5 MG tablet Take 1 tablet by mouth Daily.      . metoprolol (LOPRESSOR) 50 MG tablet Take 1 tablet by mouth 2 (two) times daily.       [name]  OLANZapine (ZYPREXA) 5 MG tablet Take 5 mg by mouth at bedtime.        . promethazine (PHENERGAN) 12.5 MG tablet Take 12.5 mg by mouth every 6 (six) hours as needed.        . rosuvastatin (CRESTOR) 20 MG tablet Take 20 mg by mouth daily.        [name] spironolactone (ALDACTONE) 25 MG tablet Take 25 mg by mouth daily.        [name] warfarin (COUMADIN) 4 MG tablet Take 8 mg by mouth daily.         Assessment:  60 y.o. female with history of Afib on chronic anticoagulation. Home warfarin dose reported as 8mg  po daily  Admitted with SBO. RN reported blood in urinary catheter today. Dr [name] stated to continue to monitor.  No orders to hold coumadin at this time.  INR currently therapeutic. Anticipate increase in INR with start of Levaquin yesterday so will provide a slightly decreased warfarin dose today.  Goal of Therapy:  INR 2-3   Plan:   Coumadin 6mg  PO once today. Follow up INR in AM.  [name] 11/27/2011,7:53 AM Pager: [phone]

## 2011-11-28 LAB — COMPREHENSIVE METABOLIC PANEL
Albumin: 2.8 g/dL — ABNORMAL LOW (ref 3.5–5.2)
Alkaline Phosphatase: 88 U/L (ref 39–117)
BUN: 31 mg/dL — ABNORMAL HIGH (ref 6–23)
Chloride: 95 mEq/L — ABNORMAL LOW (ref 96–112)
Glucose, Bld: 139 mg/dL — ABNORMAL HIGH (ref 70–99)
Potassium: 3.7 mEq/L (ref 3.5–5.1)
Total Bilirubin: 0.2 mg/dL — ABNORMAL LOW (ref 0.3–1.2)

## 2011-11-28 LAB — CBC
HCT: 33.4 % — ABNORMAL LOW (ref 36.0–46.0)
Hemoglobin: 9.9 g/dL — ABNORMAL LOW (ref 12.0–15.0)
RBC: 3.73 MIL/uL — ABNORMAL LOW (ref 3.87–5.11)
RDW: 17.6 % — ABNORMAL HIGH (ref 11.5–15.5)
WBC: 6.9 10*3/uL (ref 4.0–10.5)

## 2011-11-28 LAB — GLUCOSE, CAPILLARY
Glucose-Capillary: 139 mg/dL — ABNORMAL HIGH (ref 70–99)
Glucose-Capillary: 157 mg/dL — ABNORMAL HIGH (ref 70–99)
Glucose-Capillary: 201 mg/dL — ABNORMAL HIGH (ref 70–99)
Glucose-Capillary: 210 mg/dL — ABNORMAL HIGH (ref 70–99)

## 2011-11-28 LAB — PROTIME-INR: INR: 2.52 — ABNORMAL HIGH (ref 0.00–1.49)

## 2011-11-28 MED ORDER — WARFARIN SODIUM 6 MG PO TABS
6.0000 mg | ORAL_TABLET | Freq: Once | ORAL | Status: AC
  Administered 2011-11-28: 6 mg via ORAL
  Filled 2011-11-28: qty 1

## 2011-11-28 NOTE — Progress Notes (Signed)
ANTICOAGULATION CONSULT NOTE - Follow Up Consult  Pharmacy Consult for Coumadin Indication: atrial fibrillation  Allergies  Allergen Reactions  . Daptomycin Other (See Comments)    Elevated CPK  . Morphine And Related Nausea And Vomiting  . Keflex Rash  . Celecoxib   . Codeine   . Fluoxetine Hcl   . Latex     REACTION: undefined  . Ofloxacin     unknown  . Penicillins   . Rofecoxib   . Sulfonamide Derivatives     Patient Measurements: Height: 5\' 3"  (160 cm) Weight: 285 lb 15 oz (129.7 kg) IBW/kg (Calculated) : 52.4  Adjusted Body Weight:   Vital Signs: Temp: 98.2 F (36.8 C) (12/20 0606) Temp src: Oral (12/20 0606) BP: 141/79 mmHg (12/20 0606) Pulse Rate: 57  (12/20 0606)  Labs:  Basename 11/28/11 0348 11/27/11 0355 11/26/11 0409  HGB 9.9* 10.0* --  HCT 33.4* 33.0* 31.9*  PLT 242 261 234  APTT -- -- --  LABPROT 27.6* 26.7* 26.2*  INR 2.52* 2.42* 2.36*  HEPARINUNFRC -- -- --  CREATININE 2.16* 1.98* 1.76*  CKTOTAL -- -- --  CKMB -- -- --  TROPONINI -- -- --   Estimated Creatinine Clearance: 36 ml/min (by C-G formula based on Cr of 2.16).   Medications:  Scheduled:    . acidophilus  1 capsule Oral BID  . allopurinol  100 mg Oral Daily  . aspirin EC  81 mg Oral Daily  . digoxin  125 mcg Oral Daily  . diltiazem  360 mg Oral Daily  . docusate sodium  100 mg Oral BID  . fluticasone  2 puff Inhalation TID  . furosemide  80 mg Intravenous BID  . insulin aspart  0-20 Units Subcutaneous Q4H  . levofloxacin  750 mg Oral Q48H  . metolazone  2.5 mg Oral Daily  . metoprolol  50 mg Oral BID  . milk and molasses   Rectal Once  . nystatin  5 mL Oral QID  . OLANZapine  5 mg Oral QHS  . potassium chloride  40 mEq Oral BID  . rosuvastatin  20 mg Oral Daily  . spironolactone  25 mg Oral Daily  . warfarin  6 mg Oral ONCE-1800    Assessment:  60 yo female with hx Afib on chronic coumadin  Home dose reported as 8mg  po daily  No further hematuria reported  since 12/18 pm, H/H stable  Levaquin started 12/18, anticipating drug interaction, therefore, coumadin dose empirically decreased yesterday  INR therapeutic 2.52   Goal of Therapy:  INR 2-3   Plan:   Repeat coumadin 6mg  today  Daily PT/INR  Continue to monitor for Levaquin interaction   [name] 11/28/2011,9:51 AM Pager: [phone]

## 2011-11-28 NOTE — Progress Notes (Signed)
Patient has not c/o abd pain, nausea, or vomiting this shift.  Md was in later in the night on 12/19 and suggested to patient that she may be discharged 12/20. Patient has had BMs and voiding sufficiently.

## 2011-11-29 LAB — GLUCOSE, CAPILLARY
Glucose-Capillary: 154 mg/dL — ABNORMAL HIGH (ref 70–99)
Glucose-Capillary: 147 mg/dL — ABNORMAL HIGH (ref 70–99)

## 2011-11-29 LAB — COMPREHENSIVE METABOLIC PANEL
ALT: 9 U/L (ref 0–35)
AST: 15 U/L (ref 0–37)
Albumin: 2.7 g/dL — ABNORMAL LOW (ref 3.5–5.2)
Alkaline Phosphatase: 77 U/L (ref 39–117)
Chloride: 97 mEq/L (ref 96–112)
Potassium: 4.3 mEq/L (ref 3.5–5.1)
Total Bilirubin: 0.2 mg/dL — ABNORMAL LOW (ref 0.3–1.2)

## 2011-11-29 LAB — PROTIME-INR: INR: 2.56 — ABNORMAL HIGH (ref 0.00–1.49)

## 2011-11-29 LAB — CBC
Platelets: 237 10*3/uL (ref 150–400)
RDW: 17.5 % — ABNORMAL HIGH (ref 11.5–15.5)
WBC: 8.1 10*3/uL (ref 4.0–10.5)

## 2011-11-29 MED ORDER — OXYCODONE-ACETAMINOPHEN 5-325 MG PO TABS: 1.0000 | ORAL_TABLET | ORAL | Status: AC | PRN

## 2011-11-29 MED ORDER — FUROSEMIDE 40 MG PO TABS: 40.0000 mg | ORAL_TABLET | Freq: Two times a day (BID) | ORAL | Status: DC

## 2011-11-29 MED ORDER — INSULIN ASPART 100 UNIT/ML ~~LOC~~ SOLN
0.0000 [IU] | SUBCUTANEOUS | Status: DC
Start: 2011-11-29 — End: 2012-01-21

## 2011-11-29 MED ORDER — WARFARIN SODIUM 6 MG PO TABS
6.0000 mg | ORAL_TABLET | Freq: Once | ORAL | Status: DC
  Filled 2011-11-29: qty 1

## 2011-11-29 MED ORDER — FUROSEMIDE 40 MG PO TABS
40.0000 mg | ORAL_TABLET | Freq: Two times a day (BID) | ORAL | Status: DC
  Administered 2011-11-29: 40 mg via ORAL
  Filled 2011-11-29 (×4): qty 1

## 2011-11-29 MED ORDER — WARFARIN SODIUM 6 MG PO TABS: 6.0000 mg | ORAL_TABLET | Freq: Once | ORAL | Status: DC

## 2011-11-29 MED ORDER — RISAQUAD PO CAPS: 1.0000 | ORAL_CAPSULE | Freq: Two times a day (BID) | ORAL | Status: DC

## 2011-11-29 NOTE — Progress Notes (Signed)
ANTICOAGULATION CONSULT NOTE - Follow Up Consult  Pharmacy Consult for Coumadin Indication: Atrial Fibrillation, Hx CVA  Allergies  Allergen Reactions  . Daptomycin Other (See Comments)    Elevated CPK  . Morphine And Related Nausea And Vomiting  . Keflex Rash  . Celecoxib   . Codeine   . Fluoxetine Hcl   . Latex     REACTION: undefined  . Ofloxacin     unknown  . Penicillins   . Rofecoxib   . Sulfonamide Derivatives     Patient Measurements: Height: 5\' 3"  (160 cm) Weight: 285 lb 15 oz (129.7 kg) IBW/kg (Calculated) : 52.4    Vital Signs: Temp: 98.1 F (36.7 C) (12/21 0622) Temp src: Oral (12/21 0622) BP: 157/60 mmHg (12/21 0622) Pulse Rate: 55  (12/21 0622)  Labs:  Basename 11/29/11 0354 11/28/11 0348 11/27/11 0355  HGB 9.8* 9.9* --  HCT 32.3* 33.4* 33.0*  PLT 237 242 261  APTT -- -- --  LABPROT 27.9* 27.6* 26.7*  INR 2.56* 2.52* 2.42*  HEPARINUNFRC -- -- --  CREATININE 2.17* 2.16* 1.98*  CKTOTAL -- -- --  CKMB -- -- --  TROPONINI -- -- --   Estimated Creatinine Clearance: 35.8 ml/min (by C-G formula based on Cr of 2.17).   Medications:  Scheduled:     . acidophilus  1 capsule Oral BID  . allopurinol  100 mg Oral Daily  . aspirin EC  81 mg Oral Daily  . digoxin  125 mcg Oral Daily  . diltiazem  360 mg Oral Daily  . docusate sodium  100 mg Oral BID  . fluticasone  2 puff Inhalation TID  . furosemide  40 mg Oral BID  . insulin aspart  0-20 Units Subcutaneous Q4H  . levofloxacin  750 mg Oral Q48H  . metolazone  2.5 mg Oral Daily  . metoprolol  50 mg Oral BID  . milk and molasses   Rectal Once  . nystatin  5 mL Oral QID  . OLANZapine  5 mg Oral QHS  . potassium chloride  40 mEq Oral BID  . rosuvastatin  20 mg Oral Daily  . spironolactone  25 mg Oral Daily  . warfarin  6 mg Oral ONCE-1800  . DISCONTD: furosemide  80 mg Intravenous BID   **Home warfarin dosage was reported as 8mg  daily during medication reconciliation, but today patient tells  me she usually takes 6mg  daily and uses 8mg  as booster doses only when prescribed for low INRs.   Assessment:  60 yo female on chronic warfarin for atrial fibrillation and hx CVA  No further hematuria reported since 12/18 pm, H/H stable  Concomitant levofloxacin noted - can increase INR on warfarin.  INR therapeutic today   Goal of Therapy:  INR 2-3   Plan:   Repeat warfarin 6mg  today  Daily PT/INR  Continue to monitor for potential levofloxacin interaction   Elie Goody, Pharm.D.  [phone] 11/29/2011 9:15 AM

## 2011-11-29 NOTE — Progress Notes (Signed)
Subjective:  Patient reports she's been feeling better overall. She is not having as much nausea. Bowel movements are active. Denies abdominal pain. She has described 2 episodes a day of becoming nervous after taking her insulin prior to eating. She felt as if her blood sugars were dropping. This however was not documented. Patient is beginning to ambulate more. Denies significant exertional dyspnea. Feel she can otherwise manage at home. No other new complaints.   Allergies  Allergen Reactions  . Daptomycin Other (See Comments)    Elevated CPK  . Morphine And Related Nausea And Vomiting  . Keflex Rash  . Celecoxib   . Codeine   . Fluoxetine Hcl   . Latex     REACTION: undefined  . Ofloxacin     unknown  . Penicillins   . Rofecoxib   . Sulfonamide Derivatives    Current Facility-Administered Medications  Medication Dose Route Frequency Provider Last Rate Last Dose  . 0.45 % sodium chloride infusion   Intravenous Continuous Larina Bras, NP 20 mL/hr at 11/25/11 1829 20 mL/hr at 11/25/11 1829  . acidophilus (RISAQUAD) capsule 1 capsule  1 capsule Oral BID Larina Bras, NP   1 capsule at 11/28/11 2055  . albuterol (PROVENTIL HFA;VENTOLIN HFA) 108 (90 BASE) MCG/ACT inhaler 2 puff  2 puff Inhalation Q4H PRN Houston Siren   2 puff at 11/24/11 0818  . albuterol (PROVENTIL) (5 MG/ML) 0.5% nebulizer solution 2.5 mg  2.5 mg Nebulization Q2H PRN Michelle A. [employer]      . allopurinol (ZYLOPRIM) tablet 100 mg  100 mg Oral Daily Larina Bras, NP   100 mg at 11/28/11 1015  . aspirin EC tablet 81 mg  81 mg Oral Daily Peter Le   81 mg at 11/28/11 1014  . digoxin (LANOXIN) tablet 125 mcg  125 mcg Oral Daily Peter Le   125 mcg at 11/28/11 1014  . diltiazem (CARDIZEM CD) 24 hr capsule 360 mg  360 mg Oral Daily Peter Le   360 mg at 11/28/11 1015  . docusate sodium (COLACE) capsule 100 mg  100 mg Oral BID Larina Bras, NP   100 mg at 11/28/11 2129  . fluticasone (FLOVENT HFA) 220 MCG/ACT  inhaler 2 puff  2 puff Inhalation TID Houston Siren   2 puff at 11/28/11 0829  . furosemide (LASIX) injection 80 mg  80 mg Intravenous BID Michelle A. Matthews   80 mg at 11/28/11 1807  . hydrALAZINE (APRESOLINE) injection 10 mg  10 mg Intravenous Q6H PRN Rolan Lipa   10 mg at 11/24/11 2224  . HYDROmorphone (DILAUDID) injection 1 mg  1 mg Intravenous Q4H PRN Houston Siren      . insulin aspart (novoLOG) injection 0-20 Units  0-20 Units Subcutaneous Q4H Peter Le   7 Units at 11/28/11 2129  . levofloxacin (LEVAQUIN) tablet 750 mg  750 mg Oral Q48H Amanda Runyon, PHARMD   750 mg at 11/27/11 1502  . metolazone (ZAROXOLYN) tablet 2.5 mg  2.5 mg Oral Daily Michelle A. Matthews   2.5 mg at 11/28/11 1014  . metoprolol (LOPRESSOR) tablet 50 mg  50 mg Oral BID Peter Le   50 mg at 11/28/11 2129  . milk and molasses enema   Rectal Once Larina Bras, NP      . nystatin (MYCOSTATIN) 100000 UNIT/ML suspension 500,000 Units  5 mL Oral QID Larina Bras, NP   500,000 Units at 11/28/11 2135  . OLANZapine (ZYPREXA) tablet 5 mg  5 mg Oral QHS  Houston Siren   5 mg at 11/28/11 2129  . ondansetron (ZOFRAN) tablet 4 mg  4 mg Oral Q6H PRN Houston Siren       Or  . ondansetron [facility]) injection 4 mg  4 mg Intravenous Q6H PRN Houston Siren      . potassium chloride SA (K-DUR,KLOR-CON) CR tablet 40 mEq  40 mEq Oral BID Willey Blade, MD   40 mEq at 11/28/11 2129  . rosuvastatin (CRESTOR) tablet 20 mg  20 mg Oral Daily Peter Le   20 mg at 11/28/11 1015  . spironolactone (ALDACTONE) tablet 25 mg  25 mg Oral Daily Michelle A. Matthews   25 mg at 11/28/11 1014  . warfarin (COUMADIN) tablet 6 mg  6 mg Oral ONCE-1800 Willey Blade, MD   6 mg at 11/28/11 1807  . witch hazel-glycerin (TUCKS) pad   Topical PRN Larina Bras, NP        Objective: Blood pressure 148/71, pulse 57, temperature 98.1 F (36.7 C), temperature source Oral, resp. rate 18, height 5\' 3"  (1.6 m), weight 285 lb 15 oz (129.7 kg), SpO2 99.00%.  Well-developed obese  black female in no acute distress. HEENT:no sinus tenderness. NECK:no posterior cervical nodes. LUNGS:lungs are clear..  No vocal fremitus. No wheezes. ZO:XWRUEA S1, S2 without S3. VWU:JWJX active bowel sounds. Nontender. BJY:NWGNF to 1+ edema. Negative Homans. Mild pretibial tenderness. NEURO:intact.  Lab results: Results for orders placed during the hospital encounter of 11/22/11 (from the past 48 hour(s))  GLUCOSE, CAPILLARY     Status: Abnormal   Collection Time   11/27/11 12:07 AM      Component Value Range Comment   Glucose-Capillary 137 (*) 70 - 99 (mg/dL)   PROTIME-INR     Status: Abnormal   Collection Time   11/27/11  3:55 AM      Component Value Range Comment   Prothrombin Time 26.7 (*) 11.6 - 15.2 (seconds)    INR 2.42 (*) 0.00 - 1.49    CBC     Status: Abnormal   Collection Time   11/27/11  3:55 AM      Component Value Range Comment   WBC 8.0  4.0 - 10.5 (K/uL)    RBC 3.71 (*) 3.87 - 5.11 (MIL/uL)    Hemoglobin 10.0 (*) 12.0 - 15.0 (g/dL)    HCT 62.1 (*) 30.8 - 46.0 (%)    MCV 88.9  78.0 - 100.0 (fL)    MCH 27.0  26.0 - 34.0 (pg)    MCHC 30.3  30.0 - 36.0 (g/dL)    RDW 65.7 (*) 84.6 - 15.5 (%)    Platelets 261  150 - 400 (K/uL)   COMPREHENSIVE METABOLIC PANEL     Status: Abnormal   Collection Time   11/27/11  3:55 AM      Component Value Range Comment   Sodium 139  135 - 145 (mEq/L)    Potassium 3.2 (*) 3.5 - 5.1 (mEq/L)    Chloride 96  96 - 112 (mEq/L)    CO2 34 (*) 19 - 32 (mEq/L)    Glucose, Bld 153 (*) 70 - 99 (mg/dL)    BUN 22  6 - 23 (mg/dL)    Creatinine, Ser 9.62 (*) 0.50 - 1.10 (mg/dL)    Calcium 8.4  8.4 - 10.5 (mg/dL)    Total Protein 6.8  6.0 - 8.3 (g/dL)    Albumin 2.7 (*) 3.5 - 5.2 (g/dL)    AST 11  0 - 37 (U/L)  ALT 7  0 - 35 (U/L)    Alkaline Phosphatase 89  39 - 117 (U/L)    Total Bilirubin 0.3  0.3 - 1.2 (mg/dL)    GFR calc non Af Amer 26 (*) >90 (mL/min)    GFR calc Af Amer 30 (*) >90 (mL/min)   PRO B NATRIURETIC PEPTIDE      Status: Abnormal   Collection Time   11/27/11  3:55 AM      Component Value Range Comment   Pro B Natriuretic peptide (BNP) 561.8 (*) 0 - 125 (pg/mL)   MAGNESIUM     Status: Normal   Collection Time   11/27/11  3:55 AM      Component Value Range Comment   Magnesium 1.6  1.5 - 2.5 (mg/dL)   GLUCOSE, CAPILLARY     Status: Abnormal   Collection Time   11/27/11  3:57 AM      Component Value Range Comment   Glucose-Capillary 168 (*) 70 - 99 (mg/dL)   GLUCOSE, CAPILLARY     Status: Abnormal   Collection Time   11/27/11  7:50 AM      Component Value Range Comment   Glucose-Capillary 110 (*) 70 - 99 (mg/dL)   GLUCOSE, CAPILLARY     Status: Abnormal   Collection Time   11/27/11 12:12 PM      Component Value Range Comment   Glucose-Capillary 144 (*) 70 - 99 (mg/dL)   GLUCOSE, CAPILLARY     Status: Abnormal   Collection Time   11/27/11  4:34 PM      Component Value Range Comment   Glucose-Capillary 158 (*) 70 - 99 (mg/dL)   GLUCOSE, CAPILLARY     Status: Abnormal   Collection Time   11/27/11  8:11 PM      Component Value Range Comment   Glucose-Capillary 161 (*) 70 - 99 (mg/dL)    Comment 1 Notify RN     GLUCOSE, CAPILLARY     Status: Abnormal   Collection Time   11/28/11 12:02 AM      Component Value Range Comment   Glucose-Capillary 139 (*) 70 - 99 (mg/dL)   PROTIME-INR     Status: Abnormal   Collection Time   11/28/11  3:48 AM      Component Value Range Comment   Prothrombin Time 27.6 (*) 11.6 - 15.2 (seconds)    INR 2.52 (*) 0.00 - 1.49    CBC     Status: Abnormal   Collection Time   11/28/11  3:48 AM      Component Value Range Comment   WBC 6.9  4.0 - 10.5 (K/uL)    RBC 3.73 (*) 3.87 - 5.11 (MIL/uL)    Hemoglobin 9.9 (*) 12.0 - 15.0 (g/dL)    HCT 87.5 (*) 64.3 - 46.0 (%)    MCV 89.5  78.0 - 100.0 (fL)    MCH 26.5  26.0 - 34.0 (pg)    MCHC 29.6 (*) 30.0 - 36.0 (g/dL)    RDW 32.9 (*) 51.8 - 15.5 (%)    Platelets 242  150 - 400 (K/uL)   COMPREHENSIVE METABOLIC PANEL      Status: Abnormal   Collection Time   11/28/11  3:48 AM      Component Value Range Comment   Sodium 138  135 - 145 (mEq/L)    Potassium 3.7  3.5 - 5.1 (mEq/L)    Chloride 95 (*) 96 - 112 (mEq/L)    CO2 35 (*)  19 - 32 (mEq/L)    Glucose, Bld 139 (*) 70 - 99 (mg/dL)    BUN 31 (*) 6 - 23 (mg/dL)    Creatinine, Ser 1.61 (*) 0.50 - 1.10 (mg/dL)    Calcium 8.7  8.4 - 10.5 (mg/dL)    Total Protein 7.0  6.0 - 8.3 (g/dL)    Albumin 2.8 (*) 3.5 - 5.2 (g/dL)    AST 12  0 - 37 (U/L)    ALT 7  0 - 35 (U/L)    Alkaline Phosphatase 88  39 - 117 (U/L)    Total Bilirubin 0.2 (*) 0.3 - 1.2 (mg/dL)    GFR calc non Af Amer 23 (*) >90 (mL/min)    GFR calc Af Amer 27 (*) >90 (mL/min)   GLUCOSE, CAPILLARY     Status: Abnormal   Collection Time   11/28/11  3:50 AM      Component Value Range Comment   Glucose-Capillary 147 (*) 70 - 99 (mg/dL)   GLUCOSE, CAPILLARY     Status: Abnormal   Collection Time   11/28/11  7:46 AM      Component Value Range Comment   Glucose-Capillary 125 (*) 70 - 99 (mg/dL)   GLUCOSE, CAPILLARY     Status: Abnormal   Collection Time   11/28/11 11:48 AM      Component Value Range Comment   Glucose-Capillary 157 (*) 70 - 99 (mg/dL)   GLUCOSE, CAPILLARY     Status: Abnormal   Collection Time   11/28/11  4:25 PM      Component Value Range Comment   Glucose-Capillary 210 (*) 70 - 99 (mg/dL)   GLUCOSE, CAPILLARY     Status: Abnormal   Collection Time   11/28/11  9:04 PM      Component Value Range Comment   Glucose-Capillary 201 (*) 70 - 99 (mg/dL)    Comment 1 Notify RN       Studies/Results: Dg Abd 2 Views  17-Dec-2011  *RADIOLOGY REPORT*  Clinical Data: Abdominal distention.  Abdominal pain.  Recent partial small bowel obstruction.  ABDOMEN - 2 VIEW  Comparison: Radiographs dated 12/16 and 06/02/2011 and CT scan dated 11/23/2011  Findings: There is air scattered throughout nondistended loops of large and small bowel.  No focal small bowel dilatation.  No free air.   Evidence of previous lower lumbar fusion.  IMPRESSION: No evidence of obstruction or residual bowel dilatation.  Original Report Authenticated By: Gwynn Burly, M.D.    Patient Active Problem List  Diagnoses  . HYPERLIPIDEMIA  . OBSTRUCTIVE SLEEP APNEA  . HYPERTENSION  . ATRIAL FIBRILLATION WITH RAPID VENTRICULAR RESPONSE  . Acute combined systolic and diastolic heart failure  . 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 BRONCHITIS, RECURRENT  . ASTHMA  . Obesity hypoventilation syndrome  . Prosthetic joint infection  . Group B streptococcal infection  . Gout  . Yeast infection involving the vagina and surrounding area  . SBO (small bowel obstruction)  . DM (diabetes mellitus)  . Chronic anticoagulation  . Oral candidiasis    Impression: 1. Partial small bowel obstruction resolved. 2. Acute combined systolic and diastolic heart failure improving. 3. Asthmatic bronchitis, improved. 4. Renal insufficiency. Increased creatinine noted. Rule out early obstruction. Foley catheter however still in place. 5. Diabetes mellitus. 6. Atrial fibrillation with chronic Coumadin therapy. 7. Deconditioning. 8. Hypokalemia, resolved.   Plan: 1. DC Foley catheter. 2. Renal ultrasound to exclude obstruction. 3. Increase activity as tolerated. 4. Monitor blood  sugars for hypoglycemia.   [name], Tashina Credit 11/29/2011 12:02 AM

## 2011-11-29 NOTE — Progress Notes (Signed)
Patient has been resting/sleeping without cpap in place, no resp distress noted.

## 2011-12-24 ENCOUNTER — Ambulatory Visit: Payer: Medicare HMO | Admitting: Infectious Disease

## 2011-12-25 NOTE — Discharge Summary (Signed)
Physician Discharge Summary  Patient ID: [name] MRN: [id number] DOB/AGE: 62-Jun-1952 62 y.o.  Admit date: 11/22/2011 Discharge date: 11/29/2011   Discharge Diagnoses:  Partial small bowel obstruction resolved. Obstructive sleep apnea poorly compliant with CPAP. Hypertension. Acute combined systolic and diastolic heart failure. Asthmatic bronchitis Obesity hypoventilation syndrome Diabetes mellitus Chronic anticoagulation. Renal insufficiency. No evidence for obstruction. Discharged Condition: Improved.  Operations/Procedues: None.   Hospital Course: Patient is a 61 year old married black female with history of atrial fibrillation on chronic anticoagulation, severe sleep apnea, morbid obesity, hypertension, diabetes mellitus, status post appendectomy and cholecystectomy who presented to the emergency room complaining of abdominal pain. Patient reported that pain was a one-day duration. Evaluation emergency room included abdominal CT scan which shows suggestion of small bowel obstruction with a transitional point. Patient was admitted to the hospital for further therapy by the hospitalist. Past medical history and physical exam his permission physical.  Patient was admitted to telemetry for close observation. Due to possibility of a small bowel obstruction an NG tube was placed to low wall suction. Her bowels were put to rest as well. Her Coumadin was held. When she was placed on IV fluids with serial blood sugar evaluations. Her sleep apnea was not addressed acutely with CPAP machine. G-tube at that time. Over the subsequent days the patient's bowel activity slowly return to normal. This was documented by serial KUBs. During this period of time with receiving IV fluids the patient developed worsening of her chronic systolic and diastolic heart failure. This required medication adjustment as well. Notably toward the end of our hospital stay her creatinine started to increase. A renal  ultrasound was done which showed no evidence for hydronephrosis. Her medications were adjusted with improvement thereafter. She eventually recovered to the point of being stable for discharge. It is acknowledged the patient had multiple medical problems which will continue to be managed as an outpatient.     Significant Diagnostic Studies: CT scan of the abdomen.  Disposition: Home or Self Care  Discharge Orders    Future Appointments: Provider: Department: Dept Phone: Center:   12/27/2011 3:00 PM Acey Lav, MD [facility] [phone] [facility]   05/06/2012 10:30 AM Waymon Budge, MD [facility] [phone] None     Discharge Medication List as of 11/29/2011  3:19 PM    START taking these medications   Details  acidophilus (RISAQUAD) CAPS Take 1 capsule by mouth 2 (two) times daily., Starting 11/29/2011, Until Discontinued, Print    oxyCODONE-acetaminophen (ROXICET) 5-325 MG per tablet Take 1 tablet by mouth every 4 (four) hours as needed for pain., Starting 11/29/2011, Until Mon 12/09/11, Print      CONTINUE these medications which have CHANGED   Details  furosemide (LASIX) 40 MG tablet Take 1 tablet (40 mg total) by mouth 2 (two) times daily., Starting 11/29/2011, Until Sat 11/28/12, Print    insulin aspart (NOVOLOG) 100 UNIT/ML injection Inject 0-20 Units into the skin every 4 (four) hours., Starting 11/29/2011, Until Sat 11/28/12, Print    warfarin (COUMADIN) 6 MG tablet Take 1 tablet (6 mg total) by mouth one time only at 6 PM., Starting 11/29/2011, Until Sat 11/28/12, Normal      CONTINUE these medications which have NOT CHANGED   Details  albuterol (VENTOLIN HFA) 108 (90 BASE) MCG/ACT inhaler Inhale 2 puffs into the lungs every 6 (six) hours as needed.  , Until Discontinued, Historical Med    allopurinol (ZYLOPRIM) 100 MG tablet Take 1 tablet by  mouth daily., Starting 09/16/2011, Until Discontinued, Historical Med    aspirin 81 MG tablet Take 81 mg by  mouth daily.  , Until Discontinued, Historical Med    clobetasol (TEMOVATE) 0.05 % ointment Use as directed on legs, Starting 04/29/2011, Until Discontinued, Historical Med    digoxin (LANOXIN) 0.125 MG tablet Take 1 tablet by mouth daily., Starting 09/14/2011, Until Discontinued, Historical Med    diltiazem (TIAZAC) 360 MG 24 hr capsule Take 360 mg by mouth daily.  , Until Discontinued, Historical Med    fluticasone (FLOVENT HFA) 220 MCG/ACT inhaler Inhale 2 puffs into the lungs 3 (three) times daily.  , Until Discontinued, Historical Med    GNP LANCETS MICRO THIN 33G MISC Starting 08/22/2011, Until Discontinued, Historical Med    LANTUS 100 UNIT/ML injection 10 units at bedtime, Starting 08/10/2011, Until Discontinued, Historical Med    methotrexate (RHEUMATREX) 2.5 MG tablet Take 10 mg by mouth once a week. Caution:Chemotherapy. Protect from light. , Until Discontinued, Historical Med    metolazone (ZAROXOLYN) 2.5 MG tablet Take 1 tablet by mouth Daily., Starting 04/08/2011, Until Discontinued, Historical Med    metoprolol (LOPRESSOR) 50 MG tablet Take 1 tablet by mouth 2 (two) times daily. , Starting 05/01/2011, Until Discontinued, Historical Med    OLANZapine (ZYPREXA) 5 MG tablet Take 5 mg by mouth at bedtime.  , Until Discontinued, Historical Med    promethazine (PHENERGAN) 12.5 MG tablet Take 12.5 mg by mouth every 6 (six) hours as needed.  , Until Discontinued, Historical Med    rosuvastatin (CRESTOR) 20 MG tablet Take 20 mg by mouth daily.  , Until Discontinued, Historical Med    spironolactone (ALDACTONE) 25 MG tablet Take 25 mg by mouth daily.  , Until Discontinued, Historical Med       Follow-up Information    Follow up with [name], Londen Bok, MD .         SignedAugust Saucer, Kaileen Bronkema 12/25/2011, 6:30 PM

## 2011-12-27 ENCOUNTER — Ambulatory Visit: Payer: Medicare HMO | Admitting: Infectious Disease

## 2011-12-30 ENCOUNTER — Ambulatory Visit
Admission: RE | Admit: 2011-12-30 | Discharge: 2011-12-30 | Disposition: A | Discharge status: Home / Self Care | Payer: Medicare HMO | Source: Ambulatory Visit | Attending: Internal Medicine | Admitting: Internal Medicine

## 2011-12-30 ENCOUNTER — Other Ambulatory Visit: Payer: Self-pay | Admitting: Internal Medicine

## 2011-12-30 DIAGNOSIS — Z8719 Personal history of other diseases of the digestive system: Secondary | ICD-10-CM

## 2012-01-02 ENCOUNTER — Ambulatory Visit (INDEPENDENT_AMBULATORY_CARE_PROVIDER_SITE_OTHER): Payer: Medicare HMO | Admitting: Infectious Disease

## 2012-01-02 ENCOUNTER — Encounter: Payer: Self-pay | Admitting: Infectious Disease

## 2012-01-02 DIAGNOSIS — I10 Essential (primary) hypertension: Secondary | ICD-10-CM

## 2012-01-02 DIAGNOSIS — I4891 Unspecified atrial fibrillation: Secondary | ICD-10-CM

## 2012-01-02 DIAGNOSIS — T8450XA Infection and inflammatory reaction due to unspecified internal joint prosthesis, initial encounter: Secondary | ICD-10-CM

## 2012-01-02 DIAGNOSIS — Z7901 Long term (current) use of anticoagulants: Secondary | ICD-10-CM

## 2012-01-02 DIAGNOSIS — Y838 Other surgical procedures as the cause of abnormal reaction of the patient, or of later complication, without mention of misadventure at the time of the procedure: Secondary | ICD-10-CM

## 2012-01-02 MED ORDER — LEVOFLOXACIN 500 MG PO TABS: 500.0000 mg | ORAL_TABLET | Freq: Every day | ORAL | Status: DC

## 2012-01-02 NOTE — Assessment & Plan Note (Signed)
Recheck esr and crp today, keeping in mind she does have gout

## 2012-01-02 NOTE — Progress Notes (Signed)
Subjective:    Patient ID: [name], female    DOB: 10/31/1951, 61 y.o.   MRN: [id number]  HPI  Ms. 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 She was admitted on the 24th of June with 2 days of worsening left lower extremity pain. She was unable to support much weight on that side. Initially when she was seen in the emergency apartment, she was found to be febrile to 102 with elevated white blood cell count. She was thought to have redness in her left lower extremity with concern for possible cellulitis. She was known to have gout as well. She was  feeling ill and was in atrial flutter with a rapid response in the 120s. She was brought in to the critical care for further management. On admission, she was started empirically on vancomycin for "cellulitis". In the interim, it was found that her knee actually was exquisitely tender to palpation and she underwent aspiration of the knee 62 hours after she had been started on vancomycin. Cellcount and differential from the knee revealed 147,697 white blood cells with 67% neutrophils. Intracellular monosodium urate crystals were seen and the patient's uric acid level was elevated at 12, consistent with her prior history of gout. Sedimentation rate was also elevated at 74. Cultures ultimately yielded Group B streptococci R to clindamycin. He was in renal failure and I switched her to Daptomycin but had high CPK after initiation and was switched back to vancomycin after it was found that the group b strep was Clinda R. She NEVER had  washout of joint  At that time or exchange arthroplasty wish I had pushed for She DID have serial aspirations of the joint. She finished the IV vancomycin and then within 2 weeks of stopping had recurrence of knee pain and felt like at "ton of bricks had fallen on her leg" Within 2 weeks she was admitted to the hospital this fall with fevers, malaise, and atrial fibrillation and RVR. She had fevers and increasing knee pain. Her blood cultures were drawn adn failed to grown any organism. She was started on IV vancomycin. Dr. Jerl Santos from orthopedic surgery saw the patient. The knee was not aspirated during that visit. Her knee pain improved. We discussed options of removing the prostheitc knee joint. She was referred to [facility] where she has since seen a orthopedic surgeon there.THe patient tells me that she was offered only a below an above-the-knee amputation. She has been continued on levaquin by me since then. She feels her knee pian is stable to slightly improved vs her last visit with me. She was very htnsvie with BP over 200 here in [facility] but asymptomatic.   Review of Systems  Constitutional: Negative for fever, chills, diaphoresis, activity change, appetite change, fatigue and unexpected weight change.  HENT: Negative for congestion, sore throat, rhinorrhea, sneezing, trouble swallowing and sinus pressure.   Eyes: Negative for photophobia and visual disturbance.  Respiratory: Negative for cough, chest tightness, shortness of breath, wheezing and stridor.   Cardiovascular: Negative for chest pain, palpitations and leg swelling.  Gastrointestinal: Negative for nausea, vomiting, abdominal pain, diarrhea, constipation, blood in stool, abdominal distention  and anal bleeding.  Genitourinary: Negative for dysuria, hematuria, flank pain and difficulty urinating.  Musculoskeletal: Negative for myalgias, back pain, joint swelling, arthralgias and gait problem.  Skin: Negative for color change, pallor,  rash and wound.  Neurological: Negative for dizziness, tremors, weakness and light-headedness.  Hematological: Negative for adenopathy. Does not bruise/bleed easily.  Psychiatric/Behavioral: Negative for behavioral problems, confusion, sleep disturbance, dysphoric mood, decreased concentration and agitation.       Objective:   Physical Exam  Constitutional: She is oriented to person, place, and time. She appears well-developed and well-nourished. No distress.  HENT:  Head: Normocephalic and atraumatic.  Mouth/Throat: Oropharynx is clear and moist. No oropharyngeal exudate.  Eyes: Conjunctivae and EOM are normal. Pupils are equal, round, and reactive to light. No scleral icterus.  Neck: Normal range of motion. Neck supple. No JVD present.  Cardiovascular: Normal rate, regular rhythm and normal heart sounds.  Exam reveals no gallop and no friction rub.   No murmur heard. Pulmonary/Chest: Effort normal and breath sounds normal. No respiratory distress. She has no wheezes. She has no rales. She exhibits no tenderness.  Abdominal: She exhibits no distension and no mass. There is no tenderness. There is no rebound and no guarding.  Musculoskeletal: She exhibits edema. She exhibits no tenderness.       Legs: Lymphadenopathy:    She has no cervical adenopathy.  Neurological: She is alert and oriented to person, place, and time. She has normal reflexes. She exhibits normal muscle tone. Coordination normal.  Skin: Skin is warm and dry. She is not diaphoretic. No erythema. No pallor.  Psychiatric: She has a normal mood and affect. Her behavior is normal. Judgment and thought content normal.          Assessment & Plan:  Prosthetic joint infection Recheck esr and crp today, keeping in mind she does have gout  HYPERTENSION Poorly controlled. She states she did not take her pm meds. I think she will likely need more meds or uptittration of her therapy  Chronic anticoagulation Pt asked that  we check INR to fax to Dr. Sharyn Lull

## 2012-01-02 NOTE — Assessment & Plan Note (Signed)
Pt asked that we check INR to fax to Dr. Sharyn Lull

## 2012-01-02 NOTE — Assessment & Plan Note (Signed)
Poorly controlled. She states she did not take her pm meds. I think she will likely need more meds or uptittration of her therapy

## 2012-01-03 LAB — SEDIMENTATION RATE: Sed Rate: 136 mm/hr — ABNORMAL HIGH (ref 0–22)

## 2012-01-03 LAB — C-REACTIVE PROTEIN: CRP: 2.11 mg/dL — ABNORMAL HIGH (ref <0.60)

## 2012-01-21 ENCOUNTER — Other Ambulatory Visit: Payer: Self-pay

## 2012-01-21 ENCOUNTER — Emergency Department (HOSPITAL_COMMUNITY): Payer: Medicare HMO

## 2012-01-21 ENCOUNTER — Inpatient Hospital Stay (HOSPITAL_COMMUNITY)
Admission: EM | Admit: 2012-01-21 | Discharge: 2012-02-05 | DRG: 208 | Disposition: A | Discharge status: Home / Self Care | Payer: Medicare HMO | Attending: Internal Medicine | Admitting: Internal Medicine

## 2012-01-21 ENCOUNTER — Encounter (HOSPITAL_COMMUNITY): Payer: Self-pay | Admitting: *Deleted

## 2012-01-21 DIAGNOSIS — Y831 Surgical operation with implant of artificial internal device as the cause of abnormal reaction of the patient, or of later complication, without mention of misadventure at the time of the procedure: Secondary | ICD-10-CM | POA: Diagnosis present

## 2012-01-21 DIAGNOSIS — I129 Hypertensive chronic kidney disease with stage 1 through stage 4 chronic kidney disease, or unspecified chronic kidney disease: Secondary | ICD-10-CM | POA: Diagnosis present

## 2012-01-21 DIAGNOSIS — I10 Essential (primary) hypertension: Secondary | ICD-10-CM

## 2012-01-21 DIAGNOSIS — Z881 Allergy status to other antibiotic agents status: Secondary | ICD-10-CM

## 2012-01-21 DIAGNOSIS — R5381 Other malaise: Secondary | ICD-10-CM | POA: Diagnosis present

## 2012-01-21 DIAGNOSIS — J9600 Acute respiratory failure, unspecified whether with hypoxia or hypercapnia: Secondary | ICD-10-CM

## 2012-01-21 DIAGNOSIS — E7800 Pure hypercholesterolemia, unspecified: Secondary | ICD-10-CM | POA: Diagnosis present

## 2012-01-21 DIAGNOSIS — Z91040 Latex allergy status: Secondary | ICD-10-CM

## 2012-01-21 DIAGNOSIS — E1129 Type 2 diabetes mellitus with other diabetic kidney complication: Secondary | ICD-10-CM | POA: Diagnosis present

## 2012-01-21 DIAGNOSIS — E119 Type 2 diabetes mellitus without complications: Secondary | ICD-10-CM | POA: Diagnosis present

## 2012-01-21 DIAGNOSIS — Z96659 Presence of unspecified artificial knee joint: Secondary | ICD-10-CM

## 2012-01-21 DIAGNOSIS — L408 Other psoriasis: Secondary | ICD-10-CM | POA: Diagnosis present

## 2012-01-21 DIAGNOSIS — Z880 Allergy status to penicillin: Secondary | ICD-10-CM

## 2012-01-21 DIAGNOSIS — Y92009 Unspecified place in unspecified non-institutional (private) residence as the place of occurrence of the external cause: Secondary | ICD-10-CM

## 2012-01-21 DIAGNOSIS — I509 Heart failure, unspecified: Secondary | ICD-10-CM | POA: Diagnosis present

## 2012-01-21 DIAGNOSIS — M109 Gout, unspecified: Secondary | ICD-10-CM | POA: Diagnosis present

## 2012-01-21 DIAGNOSIS — I498 Other specified cardiac arrhythmias: Secondary | ICD-10-CM | POA: Diagnosis present

## 2012-01-21 DIAGNOSIS — T8450XA Infection and inflammatory reaction due to unspecified internal joint prosthesis, initial encounter: Secondary | ICD-10-CM | POA: Diagnosis present

## 2012-01-21 DIAGNOSIS — Z888 Allergy status to other drugs, medicaments and biological substances status: Secondary | ICD-10-CM

## 2012-01-21 DIAGNOSIS — Z8249 Family history of ischemic heart disease and other diseases of the circulatory system: Secondary | ICD-10-CM

## 2012-01-21 DIAGNOSIS — I5043 Acute on chronic combined systolic (congestive) and diastolic (congestive) heart failure: Secondary | ICD-10-CM | POA: Diagnosis present

## 2012-01-21 DIAGNOSIS — I161 Hypertensive emergency: Secondary | ICD-10-CM

## 2012-01-21 DIAGNOSIS — J45909 Unspecified asthma, uncomplicated: Secondary | ICD-10-CM | POA: Diagnosis present

## 2012-01-21 DIAGNOSIS — Z800 Family history of malignant neoplasm of digestive organs: Secondary | ICD-10-CM

## 2012-01-21 DIAGNOSIS — N184 Chronic kidney disease, stage 4 (severe): Secondary | ICD-10-CM | POA: Diagnosis present

## 2012-01-21 DIAGNOSIS — Z825 Family history of asthma and other chronic lower respiratory diseases: Secondary | ICD-10-CM

## 2012-01-21 DIAGNOSIS — F4321 Adjustment disorder with depressed mood: Secondary | ICD-10-CM | POA: Diagnosis present

## 2012-01-21 DIAGNOSIS — D631 Anemia in chronic kidney disease: Secondary | ICD-10-CM | POA: Diagnosis present

## 2012-01-21 DIAGNOSIS — Z87891 Personal history of nicotine dependence: Secondary | ICD-10-CM

## 2012-01-21 DIAGNOSIS — J9690 Respiratory failure, unspecified, unspecified whether with hypoxia or hypercapnia: Secondary | ICD-10-CM

## 2012-01-21 DIAGNOSIS — I2510 Atherosclerotic heart disease of native coronary artery without angina pectoris: Secondary | ICD-10-CM | POA: Diagnosis present

## 2012-01-21 DIAGNOSIS — N179 Acute kidney failure, unspecified: Secondary | ICD-10-CM | POA: Diagnosis present

## 2012-01-21 DIAGNOSIS — I4891 Unspecified atrial fibrillation: Secondary | ICD-10-CM | POA: Diagnosis present

## 2012-01-21 DIAGNOSIS — I5041 Acute combined systolic (congestive) and diastolic (congestive) heart failure: Secondary | ICD-10-CM | POA: Diagnosis present

## 2012-01-21 DIAGNOSIS — Z8673 Personal history of transient ischemic attack (TIA), and cerebral infarction without residual deficits: Secondary | ICD-10-CM

## 2012-01-21 DIAGNOSIS — E662 Morbid (severe) obesity with alveolar hypoventilation: Secondary | ICD-10-CM | POA: Diagnosis present

## 2012-01-21 DIAGNOSIS — J80 Acute respiratory distress syndrome: Secondary | ICD-10-CM

## 2012-01-21 DIAGNOSIS — Z7901 Long term (current) use of anticoagulants: Secondary | ICD-10-CM | POA: Diagnosis present

## 2012-01-21 DIAGNOSIS — G4733 Obstructive sleep apnea (adult) (pediatric): Secondary | ICD-10-CM | POA: Diagnosis present

## 2012-01-21 DIAGNOSIS — I214 Non-ST elevation (NSTEMI) myocardial infarction: Secondary | ICD-10-CM | POA: Diagnosis present

## 2012-01-21 DIAGNOSIS — Z882 Allergy status to sulfonamides status: Secondary | ICD-10-CM

## 2012-01-21 DIAGNOSIS — E785 Hyperlipidemia, unspecified: Secondary | ICD-10-CM | POA: Diagnosis present

## 2012-01-21 LAB — URINALYSIS, ROUTINE W REFLEX MICROSCOPIC
Leukocytes, UA: NEGATIVE
Nitrite: NEGATIVE
Specific Gravity, Urine: 1.013 (ref 1.005–1.030)
pH: 7 (ref 5.0–8.0)

## 2012-01-21 LAB — COMPREHENSIVE METABOLIC PANEL
ALT: 15 U/L (ref 0–35)
AST: 50 U/L — ABNORMAL HIGH (ref 0–37)
CO2: 20 mEq/L (ref 19–32)
Calcium: 9.3 mg/dL (ref 8.4–10.5)
GFR calc non Af Amer: 35 mL/min — ABNORMAL LOW (ref >90)
Sodium: 138 mEq/L (ref 135–145)
Total Protein: 8.2 g/dL (ref 6.0–8.3)

## 2012-01-21 LAB — DIFFERENTIAL
Basophils Absolute: 0.1 10*3/uL (ref 0.0–0.1)
Lymphocytes Relative: 17 % (ref 12–46)
Monocytes Relative: 5 % (ref 3–12)
Neutro Abs: 8 10*3/uL — ABNORMAL HIGH (ref 1.7–7.7)
Smear Review: ADEQUATE

## 2012-01-21 LAB — URINE MICROSCOPIC-ADD ON

## 2012-01-21 LAB — POCT I-STAT 3, ART BLOOD GAS (G3+)
Acid-base deficit: 8 mmol/L — ABNORMAL HIGH (ref 0.0–2.0)
Patient temperature: 98.6
pH, Arterial: 7.191 — CL (ref 7.350–7.400)

## 2012-01-21 LAB — POCT I-STAT, CHEM 8
Glucose, Bld: 360 mg/dL — ABNORMAL HIGH (ref 70–99)
HCT: 31 % — ABNORMAL LOW (ref 36.0–46.0)
Hemoglobin: 10.5 g/dL — ABNORMAL LOW (ref 12.0–15.0)
Potassium: 3.8 mEq/L (ref 3.5–5.1)
Sodium: 144 mEq/L (ref 135–145)
TCO2: 23 mmol/L (ref 0–100)

## 2012-01-21 LAB — CBC
HCT: 37.5 % (ref 36.0–46.0)
Hemoglobin: 10.8 g/dL — ABNORMAL LOW (ref 12.0–15.0)
RBC: 4.26 MIL/uL (ref 3.87–5.11)
RDW: 21.2 % — ABNORMAL HIGH (ref 11.5–15.5)
WBC: 10.6 10*3/uL — ABNORMAL HIGH (ref 4.0–10.5)

## 2012-01-21 LAB — CARDIAC PANEL(CRET KIN+CKTOT+MB+TROPI)
CK, MB: 5.6 ng/mL — ABNORMAL HIGH (ref 0.3–4.0)
Relative Index: INVALID (ref 0.0–2.5)
Troponin I: 1.42 ng/mL (ref <0.30)

## 2012-01-21 LAB — PROTIME-INR
INR: 1.97 — ABNORMAL HIGH (ref 0.00–1.49)
Prothrombin Time: 22.8 seconds — ABNORMAL HIGH (ref 11.6–15.2)

## 2012-01-21 LAB — LACTIC ACID, PLASMA: Lactic Acid, Venous: 4 mmol/L — ABNORMAL HIGH (ref 0.5–2.2)

## 2012-01-21 LAB — APTT: aPTT: 39 seconds — ABNORMAL HIGH (ref 24–37)

## 2012-01-21 LAB — GLUCOSE, CAPILLARY: Glucose-Capillary: 352 mg/dL — ABNORMAL HIGH (ref 70–99)

## 2012-01-21 MED ORDER — PROPOFOL 10 MG/ML IV EMUL
5.0000 ug/kg/min | INTRAVENOUS | Status: DC
  Administered 2012-01-21 (×2): 45 ug/kg/min via INTRAVENOUS
  Administered 2012-01-22 (×2): 25 ug/kg/min via INTRAVENOUS
  Filled 2012-01-21: qty 100

## 2012-01-21 MED ORDER — PROPOFOL 10 MG/ML IV EMUL
5.0000 ug/kg/min | INTRAVENOUS | Status: DC
  Administered 2012-01-22: 6.228 ug/kg/min via INTRAVENOUS
  Filled 2012-01-21 (×3): qty 100

## 2012-01-21 MED ORDER — FENTANYL CITRATE 0.05 MG/ML IJ SOLN: 50.0000 ug | INTRAMUSCULAR | Status: DC | PRN

## 2012-01-21 MED ORDER — WARFARIN SODIUM 5 MG PO TABS
5.0000 mg | ORAL_TABLET | Freq: Once | ORAL | Status: AC
  Administered 2012-01-21: 5 mg via ORAL
  Filled 2012-01-21 (×2): qty 1

## 2012-01-21 MED ORDER — MIDAZOLAM HCL 2 MG/2ML IJ SOLN
INTRAMUSCULAR | Status: AC
  Administered 2012-01-21: 4 mg via INTRAVENOUS
  Filled 2012-01-21: qty 4

## 2012-01-21 MED ORDER — FUROSEMIDE 10 MG/ML IJ SOLN
40.0000 mg | Freq: Once | INTRAMUSCULAR | Status: AC
  Administered 2012-01-21: 40 mg via INTRAVENOUS
  Filled 2012-01-21: qty 4

## 2012-01-21 MED ORDER — SODIUM CHLORIDE 0.9 % IV SOLN
INTRAVENOUS | Status: DC
  Administered 2012-01-21 – 2012-01-24 (×2): 20 mL/h via INTRAVENOUS

## 2012-01-21 MED ORDER — PROPOFOL 10 MG/ML IV EMUL: 5.0000 ug/kg/min | INTRAVENOUS | Status: DC

## 2012-01-21 MED ORDER — PANTOPRAZOLE SODIUM 40 MG IV SOLR
40.0000 mg | INTRAVENOUS | Status: DC
  Administered 2012-01-21: 40 mg via INTRAVENOUS
  Filled 2012-01-21: qty 40

## 2012-01-21 MED ORDER — SODIUM CHLORIDE 0.9 % IV SOLN
2000.0000 mg | Freq: Once | INTRAVENOUS | Status: DC
  Filled 2012-01-21: qty 2000

## 2012-01-21 MED ORDER — INSULIN ASPART 100 UNIT/ML ~~LOC~~ SOLN
0.0000 [IU] | SUBCUTANEOUS | Status: DC
  Administered 2012-01-21: 4 [IU] via SUBCUTANEOUS
  Administered 2012-01-22 – 2012-01-27 (×24): 1 [IU] via SUBCUTANEOUS
  Administered 2012-01-27: 3 [IU] via SUBCUTANEOUS
  Administered 2012-01-28: 1 [IU] via SUBCUTANEOUS
  Administered 2012-01-28: 3 [IU] via SUBCUTANEOUS
  Administered 2012-01-28 – 2012-01-29 (×7): 1 [IU] via SUBCUTANEOUS
  Administered 2012-01-29: 3 [IU] via SUBCUTANEOUS
  Administered 2012-01-29 – 2012-01-30 (×4): 1 [IU] via SUBCUTANEOUS
  Administered 2012-01-30: 3 [IU] via SUBCUTANEOUS
  Administered 2012-01-30: 1 [IU] via SUBCUTANEOUS
  Administered 2012-01-30: 3 [IU] via SUBCUTANEOUS
  Filled 2012-01-21: qty 3

## 2012-01-21 MED ORDER — MIDAZOLAM HCL 2 MG/2ML IJ SOLN
INTRAMUSCULAR | Status: AC
  Administered 2012-01-21: 16:00:00
  Filled 2012-01-21: qty 2

## 2012-01-21 MED ORDER — ASPIRIN 300 MG RE SUPP
300.0000 mg | RECTAL | Status: AC
  Filled 2012-01-21: qty 1

## 2012-01-21 MED ORDER — DEXTROSE 10 % IV SOLN: INTRAVENOUS | Status: DC

## 2012-01-21 MED ORDER — ASPIRIN 81 MG PO CHEW
324.0000 mg | CHEWABLE_TABLET | ORAL | Status: AC
  Administered 2012-01-21: 324 mg via ORAL
  Filled 2012-01-21: qty 4

## 2012-01-21 MED ORDER — IPRATROPIUM-ALBUTEROL 18-103 MCG/ACT IN AERO
6.0000 | INHALATION_SPRAY | Freq: Four times a day (QID) | RESPIRATORY_TRACT | Status: DC
  Administered 2012-01-22 – 2012-01-24 (×10): 6 via RESPIRATORY_TRACT
  Filled 2012-01-21: qty 14.7

## 2012-01-21 MED ORDER — DEXTROSE 5 % IV SOLN
1.0000 g | Freq: Three times a day (TID) | INTRAVENOUS | Status: DC
  Administered 2012-01-22 – 2012-01-25 (×10): 1 g via INTRAVENOUS
  Filled 2012-01-21 (×15): qty 1

## 2012-01-21 MED ORDER — PROPOFOL 10 MG/ML IV EMUL
5.0000 ug/kg/min | INTRAVENOUS | Status: DC
  Filled 2012-01-21: qty 100

## 2012-01-21 MED ORDER — PANTOPRAZOLE SODIUM 40 MG IV SOLR
40.0000 mg | Freq: Every day | INTRAVENOUS | Status: DC
  Administered 2012-01-22 – 2012-01-23 (×2): 40 mg via INTRAVENOUS
  Filled 2012-01-21 (×3): qty 40

## 2012-01-21 MED ORDER — DEXTROSE 5 % IV SOLN
500.0000 mg | INTRAVENOUS | Status: DC
  Administered 2012-01-21 – 2012-01-24 (×4): 500 mg via INTRAVENOUS
  Filled 2012-01-21 (×5): qty 500

## 2012-01-21 MED ORDER — SODIUM CHLORIDE 0.9 % IJ SOLN
INTRAMUSCULAR | Status: AC
  Administered 2012-01-21: 10 mL
  Filled 2012-01-21: qty 10

## 2012-01-21 MED ORDER — MIDAZOLAM HCL 2 MG/2ML IJ SOLN: 4.0000 mg | Freq: Once | INTRAMUSCULAR | Status: DC

## 2012-01-21 MED ORDER — HYDRALAZINE HCL 20 MG/ML IJ SOLN
10.0000 mg | INTRAMUSCULAR | Status: DC | PRN
  Administered 2012-01-23: 20 mg via INTRAVENOUS
  Administered 2012-01-23: 40 mg via INTRAVENOUS
  Administered 2012-01-23 (×2): 20 mg via INTRAVENOUS
  Administered 2012-01-24: 40 mg via INTRAVENOUS
  Filled 2012-01-21 (×7): qty 2

## 2012-01-21 MED ORDER — SODIUM CHLORIDE 0.9 % IV SOLN: 250.0000 mL | INTRAVENOUS | Status: DC | PRN

## 2012-01-21 MED ORDER — VANCOMYCIN HCL 1000 MG IV SOLR
1500.0000 mg | INTRAVENOUS | Status: DC
  Administered 2012-01-22 – 2012-01-24 (×3): 1500 mg via INTRAVENOUS
  Filled 2012-01-21 (×5): qty 1500

## 2012-01-21 NOTE — ED Provider Notes (Addendum)
Seen by me on patient in in respiratory failure unresponsive. EMS found patient talkative alert in respiratory distress administered albuterol nebulized treatment patient became more dyspneic and was on swallowing route. Patient intubated by Dr.Joines upon arrival. I supervised entire procedure. RightExternal jugular peripheral IV also placed by Dr.Joines. I supervised entire procedure. 5:15 PM the patient's husband arrived who is extremely vague in history reports that patient was up and walking earlier today, has been coughing for several days. No known fever. She her breathing became suddenly worse this afternoon after leaving the house for a short while Diagnosis respiratory failure CRITICAL CARE Performed by: Doug Sou   Total critical care time: 30 minutes  Critical care time was exclusive of separately billable procedures and treating other patients.  Critical care was necessary to treat or prevent imminent or life-threatening deterioration.  Critical care was time spent personally by me on the following activities: development of treatment plan with patient and/or surrogate as well as nursing, discussions with consultants, evaluation of patient's response to treatment, examination of patient, obtaining history from patient or surrogate, ordering and performing treatments and interventions, ordering and review of laboratory studies, ordering and review of radiographic studies, pulse oximetry and re-evaluation of patient's condition.  Doug Sou, MD 01/21/12 1728  Doug Sou, MD 01/22/12 4540

## 2012-01-21 NOTE — ED Notes (Signed)
Spouse in [place]. Contacted Chaplain per [employer] fundraiser.

## 2012-01-21 NOTE — H&P (Signed)
Name: [name] MRN: [id number] DOB: [birth year]    LOS: 0  [facility] Critical [facility] Note   History of Present Illness:  61 y/o AAF with PMH of asthma, afib on coumadin, prosthetic knee infections, MRSA in knee, combined diastolic / systolic CHF, DM, CVA, HTN, OSA, depression, HLD, on methotrexate (?? For psoriasis)  presented to [facility] ED via EMS on 2/12 with increasing shortness of breath not responsive to O2 & nebs at home.  In ER, noted to have significant accessory muscle use, diaphoretic and hypertensive. Intubated per EDP.  CXR c/w pulmonary edema.  PCCM consulted for ICU admit.    Lines / Drains: 2/12 OETT>>>  Cultures: 2/12 BCx2>>> 2/12 Resp culture>>> 2/12 UA>>> 2/12 U. Strep>>>  Antibiotics: Vanco 2/12 (?HCAP)>>> Azithro 2/12 (?HCAP)>>> Azactam (?HCAP)>>>  Tests / Events:    Past Medical History  Diagnosis Date  . Asthma   . Bronchitis   . Allergic rhinitis   . Diabetes mellitus   . Stroke   . HTN (hypertension)   . OSA (obstructive sleep apnea)   . DJD (degenerative joint disease)   . Kidney stones   . Depression   . Hyperlipidemia   . Complication of anesthesia     trouble waking up    Past Surgical History  Procedure Date  . Appendectomy   . Cholecystectomy   . Total knee arthroplasty   . Total abdominal hysterectomy   . Back surgery   . Cystectomy     left hand    Prior to Admission medications   Medication Sig Start Date End Date Taking? Authorizing Provider  acidophilus (RISAQUAD) CAPS Take 1 capsule by mouth 2 (two) times daily. 11/29/11   Willey Blade, MD  albuterol (VENTOLIN HFA) 108 (90 BASE) MCG/ACT inhaler Inhale 2 puffs into the lungs every 6 (six) hours as needed.      Historical Provider, MD  allopurinol (ZYLOPRIM) 100 MG tablet Take 1 tablet by mouth daily. 09/16/11   Historical Provider, MD  aspirin 81 MG tablet Take 81 mg by mouth daily.      Historical Provider, MD  clobetasol (TEMOVATE) 0.05 % ointment Use as  directed on legs 04/29/11   Historical Provider, MD  digoxin (LANOXIN) 0.125 MG tablet Take 1 tablet by mouth daily. 09/14/11   Historical Provider, MD  diltiazem (TIAZAC) 360 MG 24 hr capsule Take 360 mg by mouth daily.      Historical Provider, MD  fluticasone (FLOVENT HFA) 220 MCG/ACT inhaler Inhale 2 puffs into the lungs 3 (three) times daily.      Historical Provider, MD  furosemide (LASIX) 40 MG tablet Take 1 tablet (40 mg total) by mouth 2 (two) times daily. 11/29/11 11/28/12  Willey Blade, MD  GNP LANCETS MICRO THIN 33G MISC  08/22/11   Historical Provider, MD  insulin aspart (NOVOLOG) 100 UNIT/ML injection Inject 0-20 Units into the skin every 4 (four) hours. 11/29/11 11/28/12  Willey Blade, MD  LANTUS 100 UNIT/ML injection 10 units at bedtime 08/10/11   Historical Provider, MD  methotrexate (RHEUMATREX) 2.5 MG tablet Take 10 mg by mouth once a week. Caution:Chemotherapy. Protect from light.     Historical Provider, MD  metolazone (ZAROXOLYN) 2.5 MG tablet Take 1 tablet by mouth Daily. 04/08/11   Historical Provider, MD  metoprolol (LOPRESSOR) 50 MG tablet Take 1 tablet by mouth 2 (two) times daily.  05/01/11   Historical Provider, MD  OLANZapine (ZYPREXA) 5 MG tablet Take 5 mg by mouth at bedtime.  Historical Provider, MD  promethazine (PHENERGAN) 12.5 MG tablet Take 12.5 mg by mouth every 6 (six) hours as needed.      Historical Provider, MD  rosuvastatin (CRESTOR) 20 MG tablet Take 20 mg by mouth daily.      Historical Provider, MD  spironolactone (ALDACTONE) 25 MG tablet Take 25 mg by mouth daily.      Historical Provider, MD  warfarin (COUMADIN) 6 MG tablet Take 1 tablet (6 mg total) by mouth one time only at 6 PM. 11/29/11 11/28/12  Willey Blade, MD    Allergies Allergies  Allergen Reactions  . Daptomycin Other (See Comments)    Elevated CPK  . Morphine And Related Nausea And Vomiting  . Keflex Rash  . Celecoxib   . Codeine   . Fluoxetine Hcl   . Latex     REACTION: undefined  .  Ofloxacin     unknown  . Penicillins   . Rofecoxib   . Sulfonamide Derivatives     Family History Family History  Problem Relation Age of Onset  . Heart attack Father   . Asthma Father   . Heart disease Paternal Uncle   . Rectal cancer Paternal Aunt   . Other Mother     mva    Social History  reports that she has quit smoking. Her smoking use included Cigarettes. She has never used smokeless tobacco. She reports that she does not drink alcohol or use illicit drugs.  Review Of Systems: unable to complete as patient on ventilator.  Information obtained from family and medical records.   Vital Signs: Filed Vitals:   01/21/12 1551 01/21/12 1553 01/21/12 1554 01/21/12 1608  BP:  180/88 180/88 170/71  Pulse: 100 94  88  Resp: 26 31  22   Height:    5\' 5"  (1.651 m)  Weight:   286 lb 9.6 oz (130 kg)   SpO2: 95% 92%  91%     Physical Examination: General: chronically ill, morbidly obese, in NAD on vent Neuro: sedate CV: s1s2, rrr, no m/r/g, JVD noted PULM: resp's even/non-labored on vent, lungs bilaterally with crackles GI: obese/soft, bsx4 hypoactive Extremities: warm/dry, 2+ BLE edema, chronic lower extremity changes   Ventilator settings: Vent Mode:  [-] PRVC FiO2 (%):  [100 %] 100 % Set Rate:  [14 bmp] 14 bmp Vt Set:  [600 mL] 600 mL PEEP:  [8 cmH20] 8 cmH20 Plateau Pressure:  [30 cmH20] 30 cmH20  Labs    CBC  Lab 01/21/12 1633 01/21/12 1544  HGB 10.5* 10.8*  HCT 31.0* 37.5  WBC -- 10.6*  PLT -- 357     BMET  Lab 01/21/12 1633 01/21/12 1544  NA 144 138  K 3.8 6.0*  CL 110 104  CO2 -- 20  GLUCOSE 360* 320*  BUN 25* 22  CREATININE 1.50* 1.54*  CALCIUM -- 9.3  MG -- --  PHOS -- --    No results found for this basename: INR:5 in the last 168 hours   Lab 01/21/12 1633 01/21/12 1630  PHART -- 7.191*  PCO2ART -- 53.0*  PO2ART -- 63.0*  HCO3 -- 20.3  TCO2 23 22  O2SAT -- 86.0     Radiology: 2/12 CXR>>>bilateral pulmonary edema, ETT in good  position   Assessment and Plan:  Acute Respiratory Failure / Hx of Asthma & OSA on home O2 / CPAP Assessment: acute failure in setting of hypertensive emergency, hx of afib with RVR (currently rate controlled), with underlying OSA / Asthma.  Likely this is pulmonary edema.  However, will treat empirically as HCAP (recent admit in 12/12).  Unclear from family if fevers, sputum production etc.   Plan: -icu admit -check PCT, lactate hopeful to narrow abx as this is likely edema -f/u cxr -abx as above -combivent scheduled -pan culture   HTN (sbp 200 on arrival) / systolic & diastolic CHF Assessment: Plan: -assess EKG -BP control -PRN hydralazine for sbp >170 -will hold off on ECHO (known systolic / diastolic dysfunction)   Atrial Fib on chronic coumadin Assessment: chronic, SR on monitor Plan: -tele -coumadin per pharmacy -EKG pending   DM Assessment: Plan: -icu ssi  Chronic Immunosuppression Assessment: on methotrexate, chart review lends to psoriasis??.  Will need further investigation Plan: -hold methotrexate      Best practices / Disposition: -->Code Status:  Full code -->DVT Px: coumadin (afib) -->GI Px: protonix -->Diet: NPO    Canary Brim, NP-C [facility] Pgr: [phone]  01/21/2012, 4:39 PM    Levy Pupa, MD, PhD 01/21/2012, 5:18 PM  Pulmonary and Critical Care [phone] or if no answer [phone]

## 2012-01-21 NOTE — Progress Notes (Signed)
ANTIBIOTIC CONSULT NOTE - INITIAL  Pharmacy Consult for Aztreonam, Vancomycin Indication: rule out pneumonia (?HCAP)  Allergies  Allergen Reactions  . Daptomycin Other (See Comments)    Elevated CPK  . Morphine And Related Nausea And Vomiting  . Keflex Rash  . Celecoxib   . Codeine   . Fluoxetine Hcl   . Latex     REACTION: undefined  . Ofloxacin     unknown  . Penicillins   . Rofecoxib   . Sulfonamide Derivatives     Patient Measurements: Height: 5\' 5"  (165.1 cm) Weight: 286 lb 9.6 oz (130 kg) IBW/kg (Calculated) : 57    Vital Signs: Temp: 96.4 F (35.8 C) (02/12 1716) Temp src: Other (Comment) (02/12 1716) BP: 137/55 mmHg (02/12 1716) Pulse Rate: 73  (02/12 1716) Intake/Output from previous day:   Intake/Output from this shift:    Labs:  Basename 01/21/12 1633 01/21/12 1544  WBC -- 10.6*  HGB 10.5* 10.8*  PLT -- 357  LABCREA -- --  CREATININE 1.50* 1.54*   Estimated Creatinine Clearance: 53.6 ml/min (by C-G formula based on Cr of 1.5).  Microbiology: No results found for this or any previous visit (from the past 720 hour(s)).  Medical History: Past Medical History  Diagnosis Date  . Asthma   . Bronchitis   . Allergic rhinitis   . Diabetes mellitus   . Stroke   . HTN (hypertension)   . OSA (obstructive sleep apnea)   . DJD (degenerative joint disease)   . Kidney stones   . Depression   . Hyperlipidemia   . Complication of anesthesia     trouble waking up    Medications:  See med rec Assessment: 61 yo F admitted with SOB and intubated in ED.  Patient recently admitted in December.  To begin vancomycin and aztreonam for possible HCAP.  In Aug 2012 was on vancomycin 1 gm IV q 24hr with a vanc trough of 21, had slightly worsened renal function.  Will give slightly less than our protocol.    Goal of Therapy:  Vancomycin trough level 15-20 mcg/ml  Plan:  1. Aztreonam 1 gm IV q8hr  2. Vancomycin 2000 mg IV x 1 in ED, then 1500 mg IV q 24hr  (if renal function worsens will need to decrease) 3. F/U renal function, vancomycin trough at steady state, cultures  Rolland Porter, [place].D., BCPS Clinical Pharmacist Pager: [phone]

## 2012-01-21 NOTE — Progress Notes (Signed)
ANTICOAGULATION CONSULT NOTE - Initial Consult  Pharmacy Consult for Coumadin Indication: atrial fibrillation  Allergies  Allergen Reactions  . Daptomycin Other (See Comments)    Elevated CPK  . Morphine And Related Nausea And Vomiting  . Keflex Rash    unknown  . Celecoxib     unknown  . Codeine     unknown  . Fluoxetine Hcl     unknown  . Latex     REACTION: undefined  . Ofloxacin     unknown  . Penicillins     unknown  . Rofecoxib     unknown  . Sulfonamide Derivatives     unknown    Patient Measurements: Height: 5\' 5"  (165.1 cm) Weight: 286 lb 9.6 oz (130 kg) IBW/kg (Calculated) : 57   Vital Signs: Temp: 96.6 F (35.9 C) (02/12 1805) Temp src: Other (Comment) (02/12 1805) BP: 122/60 mmHg (02/12 1805) Pulse Rate: 73  (02/12 1805)  Labs:  Basename 01/21/12 1754 01/21/12 1633 01/21/12 1544  HGB -- 10.5* 10.8*  HCT -- 31.0* 37.5  PLT -- -- 357  APTT -- -- 39*  LABPROT 22.8* -- --  INR 1.97* -- --  HEPARINUNFRC -- -- --  CREATININE -- 1.50* 1.54*  CKTOTAL -- -- --  CKMB -- -- --  TROPONINI -- -- 0.40*   Estimated Creatinine Clearance: 53.6 ml/min (by C-G formula based on Cr of 1.5).  Medical History: Past Medical History  Diagnosis Date  . Asthma   . Bronchitis   . Allergic rhinitis   . Diabetes mellitus   . Stroke   . HTN (hypertension)   . OSA (obstructive sleep apnea)   . DJD (degenerative joint disease)   . Kidney stones   . Depression   . Hyperlipidemia   . Complication of anesthesia     trouble waking up    Medications:  See med rec  Assessment: 61 yo F admitted with possible HCAP, on chronic Coumadin for afib.  INR slightly below goal.  Patient is intubated, spoke with spouse and he is unsure of Coumadin dosing and date of last dose.  He states Dr. Sharyn Lull doses her Coumadin.  Office is currently closed.    Goal of Therapy:  INR 2-3   Plan:  1.  Coumadin 5 mg po once tonight 2.  F/U home Coumadin dosing 3.  INR  daily  Rolland Porter, [place].D., BCPS Clinical Pharmacist Pager: [phone]

## 2012-01-21 NOTE — Progress Notes (Signed)
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 For psoriasis) presented to [facility] ED via EMS on 2/12 with increasing shortness of breath not responsive to O2 & nebs at home. In ER, noted to have significant accessory muscle use, diaphoretic and hypertensive. Intubated per EDP. PCCM consulted for ICU admit.  Lines / Drains:  2/12 OETT>>>  2/12 R femoral>>> 2/12  Foley>>>  Cultures:  2/12 BCx2>>>pending 2/12 Resp culture>>>pending  2/12 UA>>>500 Glc, >300 protein, neg leuks/nit, 3-6 WBC, rare bacteria, 1.013 2/12 U. Strep>>>  2/12 MRSA PCR>>neg  Antibiotics:  Vanco 2/12 (?HCAP)>>>  Azithro 2/12 (?HCAP)>>>  Azactam (?HCAP)>>>   Tests / Events:  2/12  Intubated in ED  2/12  CXR: widespread air space consolidation throughout; may reflect pulmonary edema and/or multilobar PNA. Moderate enlargement cardiac silhouette; ?cardiomegaly and/or pericardial effusion  Consults: None     Subjective:    Overnight Issues:  Troponins continue to rise and patient bradycardic with ECG showing sinus bradycardia without ST-changes or TWI  Drips: 2/12 propofol>>  Objective:  Vital signs for last 24 hours: Temp:  [96.3 F (35.7 C)-97.5 F (36.4 C)] 97.5 F (36.4 C) (02/13 0600) Pulse Rate:  [48-125] 58  (02/13 0600) Resp:  [0-31] 23  (02/13 0600) BP: (115-223)/(48-88) 155/72 mmHg (02/13 0600) SpO2:  [73 %-100 %] 100 % (02/13 0600) FiO2 (%):  [40 %-100 %] 40 % (02/13 0426) Weight:  [286 lb 9.6 oz (130 kg)-294 lb 15.6 oz (133.8 kg)] 294 lb 5 oz (133.5 kg) (02/13 0443)  Hemodynamic parameters for last 24 hours:    Intake/Output from previous day: 02/12 0701 - 02/13 0700 In: 737.7 [I.V.:407.7; NG/GT:20; IV Piggyback:310] Out: 240 [Urine:240]  Intake/Output this shift:    Vent settings for last 24  hours: Vent Mode:  [-] PRVC FiO2 (%):  [40 %-100 %] 40 % Set Rate:  [14 bmp-24 bmp] 24 bmp Vt Set:  [450 mL-600 mL] 550 mL PEEP:  [5 cmH20-18 cmH20] 5 cmH20 Plateau Pressure:  [26 cmH20-30 cmH20] 30 cmH20  Physical Examination:  General: chronically ill, morbidly obese, in NAD HEENT: ETT Neuro: sedated but alert and responds easily to commands CV: s1s2, rrr, no m/r/g, JVD noted  PULM: resp's even/non-labored on vent, ronchorous throughout, did not hear wheezing or crackles, fair aeration  GI: obese/soft, non-tender Extremities: warm/dry, 2+ BLE edema, chronic lower extremity changes  Labs:  Lab 01/22/12 0500 01/21/12 1633 01/21/12 1544  NA 142 144 138  K 4.0 3.8 --  CL 109 110 104  CO2 23 -- 20  GLUCOSE 119* 360* 320*  BUN 29* 25* 22  CREATININE 2.19* 1.50* 1.54*  CALCIUM 8.2* -- 9.3  MG 1.9 -- --  PHOS 3.9 -- --    Lab 01/22/12 0500 01/21/12 1633 01/21/12 1544  HGB 8.2* 10.5* 10.8*  HCT 26.8* 31.0* 37.5  WBC 13.9* -- 10.6*  PLT 211 -- 357    Lab 01/21/12 2330  PROCALCITON 3.47     Lab 01/22/12 0441 01/21/12 1633 01/21/12 1630  PHART 7.397 -- 7.191*  PCO2ART 35.8 -- 53.0*  PO2ART 67.0* -- 63.0*  HCO3 22.2 -- 20.3  TCO2 23 23 22   O2SAT 94.0 -- 86.0    Assessment/Plan:  60 YO AAF with a history of multiple medical problems (including history asthma/OSA, AF on warfarin/HTN/systolic and diastolic  heart failure, insulin-dependent diabetes, chronic immunosuppression; past history of CVA) presenting with acute shortness-of-breath and requiring intubation in the ED.  Acute Respiratory Failure:  ?HCAP (recent hospitalization Dec 2012) Pulmonary edema on admission in setting of hypertensive emergency with significant resolution with lasix use. History of asthma & OSA/OHS on home O2/CPAP --> On vent. SBT today with intention to extubate. Will f/u [facility] today to see if appropriate for weaning.  --> F/u cxr  --> Vanc, azithromycin/aztreonam. Possible multi-lobar  infiltrates on admission CXR. Afebrile and WBC normal on admission, however, WBC elevated today. Continue antibiotics and f/u blood and respiratory cultures. Pending.  Specially given the fact that patient is on methotrexate. --> Combivent scheduled. History of tobacco use, now quit.  CV:  Hypertensive emergency on admission; history of hypertension Elevated troponins on admission. ECG showed sinus bradycardia without ST/T-wave abnormalities.   Lab 01/22/12 0600 01/21/12 2307 01/21/12 2059 01/21/12 1544  TROPONINI 2.08* 1.96* 1.42* 0.40*  Sinus bradycardia overnight 2/12-2/13 History of systolic and diastolic CHF (ECHO 05/2010 EF 11-91%) Atrial fibrillation on warfarin; history of AF with RVR  Lab 01/22/12 0427 01/21/12 1754  INR 1.97* 1.97*  --> Blood pressures 130-150s/50-70s today. --> Hydralazine prn. Did not require overnight. --> Continue to cycle troponins x2. Rate of increase declining; expect slow clearance due to acute and chronic renal insufficiency. Suspect troponinemia due to acute respiratory failure, however, she does have risk factor for CAD with poorly controlled HTN, diabetes, hyperlipidemia. Cardiology consult specially that troponin are rising.  --> Continue to hold beta-blocker due to bradycardia.  --> Holding home digoxin, diltiazem, Lasix 40 bid, metolazone 2.5, spirono 25, metoprolol 50 bid. --> Follow-up 2D-ECHO.  --> Warfarin per pharmacy.  ENDO:  DM   Lab 01/22/12 0430 01/22/12 0007 01/21/12 2225 01/21/12 1617  GLUCAP 116* 130* 189* 352*  -->  ICU hyperglycemic protocol.   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 Likely due to receiving Lasix yesterday. Monitor.   RHEUM: Chronic immunosuppression (on home methotrexate for ?psoriasis) History of gout -->  Holding methotrexate  -->  Holding allopurinol  Best practices / Disposition  -->Code Status: Full code  -->DVT Px: coumadin (afib)  -->GI  Px: protonix  -->Diet: NPO, if not extubatable today will start TF. -->Family updated at bedside   LOS: 1 day   OH PARK, ANGELA 01/22/2012  Acute VDRF due to HTN emergency resulting in pulmonary edema in a patient with chronic HTN and CRI, resolved with aggressive lasix use.  Cr slightly elevated and trops are rising.  Will give an additional dose of lasix and potassium.  Beta blockers to be started at half home dose.  Cards consult called.  Echo pending.  SBT to extubate today.  Pharmacy to manage coumadin. If not extubated will start TF.  KVO IVF.  Maintain on low dose propofol until extubated.  CC time 35 min.  Patient seen and examined, agree with above note.  I dictated the care and orders written for this patient under my direction.  Koren Bound, M.D. [phone]

## 2012-01-21 NOTE — Progress Notes (Addendum)
Md called concerning pts HR in the 40's and 50's . EKG ordered and completed. Propofol rate decreased. There was a questionable aspirin allergy, verified with spouse that she did in fact take aspirin at home

## 2012-01-21 NOTE — ED Notes (Signed)
Pt medications and glasses given to patient's son

## 2012-01-21 NOTE — ED Notes (Signed)
Pt was intubated and is continuously being bagged and sats have increased to 92%.  RT remains at bedside.

## 2012-01-21 NOTE — ED Notes (Signed)
2110-01 Ready 

## 2012-01-21 NOTE — Progress Notes (Signed)
Chaplain's Note:  Escorted pt's husband and son to conference room.  Acted as a Print production planner between family and Programme researcher, broadcasting/film/video.  Provided emotional support to pt's family.  Escorted family to pt's bedside.  Please page if further assistance is needed or requested. [employer]  [phone]

## 2012-01-21 NOTE — ED Provider Notes (Signed)
History     CSN: [id number]  Arrival date & time 01/21/12  1541   First MD Initiated Contact with Patient 01/21/12 1544      Chief Complaint  Patient presents with  . Shortness of Breath    (Consider location/radiation/quality/duration/timing/severity/associated sxs/prior treatment) HPI Comments: Level 5 caveat applies due to respiratory distress and altered mental status.   Patient is a 61 y.o. female presenting with shortness of breath. The history is provided by the EMS personnel (Husband and son). The history is limited by the condition of the patient.  Shortness of Breath  The current episode started today. The problem occurs continuously. The problem has been rapidly worsening. The problem is severe. The symptoms are relieved by nothing. Exacerbated by: exertion. Associated symptoms include shortness of breath.    Past Medical History  Diagnosis Date  . Asthma   . Bronchitis   . Allergic rhinitis   . Diabetes mellitus   . Stroke   . HTN (hypertension)   . OSA (obstructive sleep apnea)   . DJD (degenerative joint disease)   . Kidney stones   . Depression   . Hyperlipidemia   . Complication of anesthesia     trouble waking up    Past Surgical History  Procedure Date  . Appendectomy   . Cholecystectomy   . Total knee arthroplasty   . Total abdominal hysterectomy   . Back surgery   . Cystectomy     left hand    Family History  Problem Relation Age of Onset  . Heart attack Father   . Asthma Father   . Heart disease Paternal Uncle   . Rectal cancer Paternal Aunt   . Other Mother     mva    History  Substance Use Topics  . Smoking status: Former Smoker    Types: Cigarettes  . Smokeless tobacco: Never Used   Comment: smoked for 1.5 yrs about 2 cigs a day ,only if stressed  . Alcohol Use: No    OB History    Grav Para Term Preterm Abortions TAB SAB Ect Mult Living                  Review of Systems  Unable to perform ROS Respiratory: Positive  for shortness of breath.   All other systems reviewed and are negative.    Allergies  Daptomycin; Morphine and related; Keflex; Celecoxib; Codeine; Fluoxetine hcl; Latex; Ofloxacin; Penicillins; Rofecoxib; and Sulfonamide derivatives  Home Medications   Current Outpatient Rx  Name Route Sig Dispense Refill  . RISAQUAD PO CAPS Oral Take 1 capsule by mouth 2 (two) times daily. 60 capsule 3  . ALBUTEROL SULFATE HFA 108 (90 BASE) MCG/ACT IN AERS Inhalation Inhale 2 puffs into the lungs every 6 (six) hours as needed.      . ALLOPURINOL 100 MG PO TABS Oral Take 1 tablet by mouth daily.    . ASPIRIN 81 MG PO TABS Oral Take 81 mg by mouth daily.      [name] CLOBETASOL PROPIONATE 0.05 % EX OINT  Use as directed on legs    . DIGOXIN 0.125 MG PO TABS Oral Take 1 tablet by mouth daily.    [name] DILTIAZEM HCL ER BEADS 360 MG PO CP24 Oral Take 360 mg by mouth daily.      [name] FLUTICASONE PROPIONATE  HFA 220 MCG/ACT IN AERO Inhalation Inhale 2 puffs into the lungs 3 (three) times daily.      . FUROSEMIDE 40 MG  PO TABS Oral Take 1 tablet (40 mg total) by mouth 2 (two) times daily. 60 tablet 2  . GNP LANCETS MICRO THIN 33G MISC      . INSULIN ASPART 100 UNIT/ML [place] SOLN Subcutaneous Inject 0-20 Units into the skin every 4 (four) hours. 1 vial 3  . LANTUS 100 UNIT/ML [place] SOLN  10 units at bedtime    . METHOTREXATE 2.5 MG PO TABS Oral Take 10 mg by mouth once a week. Caution:Chemotherapy. Protect from light.     [name] METOLAZONE 2.5 MG PO TABS Oral Take 1 tablet by mouth Daily.    [name] METOPROLOL TARTRATE 50 MG PO TABS Oral Take 1 tablet by mouth 2 (two) times daily.     [name] OLANZAPINE 5 MG PO TABS Oral Take 5 mg by mouth at bedtime.      [name] PROMETHAZINE HCL 12.5 MG PO TABS Oral Take 12.5 mg by mouth every 6 (six) hours as needed.      [name] ROSUVASTATIN CALCIUM 20 MG PO TABS Oral Take 20 mg by mouth daily.      [name] SPIRONOLACTONE 25 MG PO TABS Oral Take 25 mg by mouth daily.      . WARFARIN SODIUM 6 MG PO TABS Oral Take 1 tablet (6  mg total) by mouth one time only at 6 PM. 30 tablet 3    BP 180/88  Pulse 94  Resp 31  SpO2 92%  Physical Exam  Nursing note and vitals reviewed. Constitutional: She appears ill. She appears distressed.  HENT:  Head: Normocephalic and atraumatic.  Mouth/Throat: Oropharynx is clear and moist.  Eyes: Conjunctivae and lids are normal. Pupils are equal, round, and reactive to light. No scleral icterus.  Neck: Neck supple. No JVD present. No tracheal deviation present.  Cardiovascular: Regular rhythm and intact distal pulses.  Tachycardia present.   No murmur heard. Pulmonary/Chest: No stridor. Bradypnea noted. She is in respiratory distress. She has decreased breath sounds. She has wheezes. She has rhonchi.  Abdominal: Soft. Normal appearance. Bowel sounds are decreased. There is no tenderness.       Morbidly obese.   Neurological: She is unresponsive. GCS eye subscore is 4. GCS verbal subscore is 1. GCS motor subscore is 5.    ED Course  CENTRAL LINE Date/Time: 01/21/2012 3:55 PM Performed by: Verne Carrow Authorized by: Wyona Almas B Consent: The procedure was performed in an emergent situation. Test results: test results available and properly labeled Site marked: the operative site was marked Patient identity confirmed: arm band, provided demographic data and hospital-assigned identification number Indications: vascular access Preparation: skin prepped with 2% chlorhexidine Skin prep agent dried: skin prep agent completely dried prior to procedure Sterile barriers: all five maximum sterile barriers used - cap, mask, sterile gown, sterile gloves, and large sterile sheet Hand hygiene: hand hygiene performed prior to central venous catheter insertion Location details: right femoral Site selection rationale: morbidly obese patient with difficulty obtaining IV access in respiratory failure Patient position: flat Catheter type: triple lumen Catheter size: 7  Fr Pre-procedure: landmarks identified Ultrasound guidance: no Number of attempts: 1 Successful placement: yes Post-procedure: line sutured and dressing applied Assessment: blood return through all parts and free fluid flow Patient tolerance: Patient tolerated the procedure well with no immediate complications. Comments: Wire removed and discarded after procedure.   INTUBATION Date/Time: 01/21/2012 3:50 PM Performed by: Verne Carrow Authorized by: Wyona Almas B Consent: The procedure was performed in an emergent situation. Patient identity confirmed:  arm band, provided demographic data and hospital-assigned identification number Indications: respiratory failure and airway protection Intubation method: direct Patient status: unconscious Preoxygenation: nonrebreather mask Pretreatment medications: none Laryngoscope size: Mac 4 Tube size: 7.5 mm Tube type: cuffed Number of attempts: 1 Cricoid pressure: yes Cords visualized: yes Post-procedure assessment: chest rise,  ETCO2 monitor and CO2 detector Breath sounds: equal Cuff inflated: yes ETT to lip: 23 cm ETT to teeth: 22 cm Tube secured with: adhesive tape Chest x-ray interpreted by me. Chest x-ray findings: endotracheal tube in appropriate position Comments: sats prior to BVM 10% with good waveform. After BVM sats up to 40%. With intubation sats rose to 92%.    (including critical care time)   Date: 01/22/2012  Rate: 106  Rhythm: sinus tachycardia  QRS Axis: normal  Intervals: normal  ST/T Wave abnormalities: nonspecific ST/T changes  Conduction Disutrbances:none  Narrative Interpretation: sinus tachycardia, some pacemaker spikes present  Old EKG Reviewed: changes noted   Labs Reviewed  CBC - Abnormal; Notable for the following:    WBC 10.6 (*)    Hemoglobin 10.8 (*)    MCH 25.4 (*)    MCHC 28.8 (*)    RDW 21.2 (*)    All other components within normal limits  DIFFERENTIAL - Abnormal; Notable for the  following:    Neutro Abs 8.0 (*)    All other components within normal limits  COMPREHENSIVE METABOLIC PANEL - Abnormal; Notable for the following:    Potassium 6.0 (*) HEMOLYSIS AT THIS LEVEL MAY AFFECT RESULT   Glucose, Bld 320 (*)    Creatinine, Ser 1.54 (*)    Albumin 3.3 (*)    AST 50 (*) HEMOLYSIS AT THIS LEVEL MAY AFFECT RESULT   GFR calc non Af Amer 35 (*)    GFR calc Af Amer 41 (*)    All other components within normal limits  PRO B NATRIURETIC PEPTIDE - Abnormal; Notable for the following:    Pro B Natriuretic peptide (BNP) 824.9 (*)    All other components within normal limits  TROPONIN I - Abnormal; Notable for the following:    Troponin I 0.40 (*)    All other components within normal limits  APTT - Abnormal; Notable for the following:    aPTT 39 (*)    All other components within normal limits  URINALYSIS, ROUTINE W REFLEX MICROSCOPIC - Abnormal; Notable for the following:    Glucose, UA 500 (*)    Hgb urine dipstick SMALL (*)    Protein, ur >300 (*)    All other components within normal limits  LACTIC ACID, PLASMA - Abnormal; Notable for the following:    Lactic Acid, Venous 4.0 (*)    All other components within normal limits  PROTIME-INR - Abnormal; Notable for the following:    Prothrombin Time 22.8 (*)    INR 1.97 (*)    All other components within normal limits  POCT I-STAT 3, BLOOD GAS (G3+) - Abnormal; Notable for the following:    pH, Arterial 7.191 (*)    pCO2 arterial 53.0 (*)    pO2, Arterial 63.0 (*)    Acid-base deficit 8.0 (*)    All other components within normal limits  POCT I-STAT, CHEM 8 - Abnormal; Notable for the following:    BUN 25 (*)    Creatinine, Ser 1.50 (*)    Glucose, Bld 360 (*)    Calcium, Ion 1.10 (*)    Hemoglobin 10.5 (*)    HCT 31.0 (*)  All other components within normal limits  GLUCOSE, CAPILLARY - Abnormal; Notable for the following:    Glucose-Capillary 352 (*)    All other components within normal limits  URINE  MICROSCOPIC-ADD ON - Abnormal; Notable for the following:    Squamous Epithelial / LPF FEW (*)    All other components within normal limits  CARDIAC PANEL(CRET KIN+CKTOT+MB+TROPI) - Abnormal; Notable for the following:    CK, MB 5.6 (*)    Troponin I 1.42 (*)    All other components within normal limits  GLUCOSE, CAPILLARY - Abnormal; Notable for the following:    Glucose-Capillary 189 (*)    All other components within normal limits  GLUCOSE, CAPILLARY - Abnormal; Notable for the following:    Glucose-Capillary 130 (*)    All other components within normal limits  MRSA PCR SCREENING  URINALYSIS, WITH MICROSCOPIC  CULTURE, BLOOD (ROUTINE X 2)  CULTURE, BLOOD (ROUTINE X 2)  CULTURE, RESPIRATORY  STREP PNEUMONIAE URINARY ANTIGEN  PROTIME-INR  CARDIAC PANEL(CRET KIN+CKTOT+MB+TROPI)  CARDIAC PANEL(CRET KIN+CKTOT+MB+TROPI)  LACTIC ACID, PLASMA  PROCALCITONIN  PRO B NATRIURETIC PEPTIDE  STREP PNEUMONIAE URINARY ANTIGEN  BLOOD GAS, ARTERIAL  CBC  BASIC METABOLIC PANEL  BLOOD GAS, ARTERIAL  MAGNESIUM  PHOSPHORUS  CARDIAC PANEL(CRET KIN+CKTOT+MB+TROPI)   Dg Chest Port 1 View  01/21/2012  *RADIOLOGY REPORT*  Clinical Data: Shortness of breath.  Endotracheal tube placement  PORTABLE CHEST - 1 VIEW  Comparison: Chest x-ray 11/24/2011.  Findings: Endotracheal tube has been placed with tip at the level of the thoracic inlet.  The lung volumes are low.  There is widespread airspace consolidation throughout all regions of the lungs bilaterally.  This completely obscures the underlying pulmonary vascularity.  Moderate enlargement of the cardiopericardial silhouette (slightly increased compared to prior).  Widening of mediastinal contours, likely accentuated by low lung volumes and slight patient rotation to the right.  IMPRESSION:  1.  Endotracheal tube appears properly located. 2.  Severe widespread air space consolidation throughout all aspects of the lungs bilaterally.  This may reflect  pulmonary edema and/or multilobar pneumonia. 3.  Moderate enlargement of the cardiopericardial silhouette which may reflect underlying cardiomegaly and/or the presence of a pericardial effusion.  Original Report Authenticated By: Florencia Reasons, M.D.     1. ARDS (adult respiratory distress syndrome)   2. Respiratory failure   3. Hypertensive emergency       MDM  60yo AAF with PMH significant for HTN, HLD, OSA, and asthma who presents to the ED via EMS due to respiratory distress. Hx from EMS and family. Patient arrived to the ED in severe respiratory distress with perioral cyanosis and hypoxia. Patient altered and unable to answer questions. IV access obtained with right EJ. Pt preoxygenated with nasal airway and BVM unable to get sat above 40s%. Pt intubated with no meds. Sats up to 90%s. Lost IV access. Pt difficult to get access on and critical patient. Right femoral cental line placed. Pt sedated with boluses of versed and started on propofol gtt.   CXR confirms placement. Xray c/w ARDS vs pulm edema. Critical care consulted. Pt with multiple abx allergies. Giving vanc getting cultures.   Family updated.   Crit care recs lasix. Giving 40mg  lasix.   Pt admitted to critical care.         Verne Carrow, MD 01/22/12 [phone]

## 2012-01-21 NOTE — ED Notes (Signed)
Res to the bedside to place a femoral line.

## 2012-01-21 NOTE — ED Notes (Signed)
Pt's rings removed (1 cut) and given to family

## 2012-01-21 NOTE — ED Notes (Signed)
Family at bedside. 

## 2012-01-21 NOTE — ED Notes (Signed)
Husband told ems that she has asthma and pt was on home 02.  Neb treatment started and pt was very sob with accessory muscle use and unable to talk and diaphoretic.  bp 188/120

## 2012-01-21 NOTE — ED Notes (Signed)
Pt is un responsive and diaphoretic and edp and resident attempting intubations with sats correlation in the 30-40s.  Pt moaning.  HR 101

## 2012-01-22 ENCOUNTER — Other Ambulatory Visit: Payer: Self-pay

## 2012-01-22 ENCOUNTER — Inpatient Hospital Stay (HOSPITAL_COMMUNITY): Payer: Medicare HMO

## 2012-01-22 ENCOUNTER — Encounter (HOSPITAL_COMMUNITY): Payer: Self-pay | Admitting: *Deleted

## 2012-01-22 LAB — GLUCOSE, CAPILLARY
Glucose-Capillary: 113 mg/dL — ABNORMAL HIGH (ref 70–99)
Glucose-Capillary: 133 mg/dL — ABNORMAL HIGH (ref 70–99)
Glucose-Capillary: 125 mg/dL — ABNORMAL HIGH (ref 70–99)

## 2012-01-22 LAB — CBC
MCH: 26.5 pg (ref 26.0–34.0)
MCHC: 30.6 g/dL (ref 30.0–36.0)
MCV: 86.5 fL (ref 78.0–100.0)
Platelets: 211 10*3/uL (ref 150–400)
RBC: 3.1 MIL/uL — ABNORMAL LOW (ref 3.87–5.11)

## 2012-01-22 LAB — CARDIAC PANEL(CRET KIN+CKTOT+MB+TROPI)
Relative Index: INVALID (ref 0.0–2.5)
Relative Index: INVALID (ref 0.0–2.5)
Total CK: 94 U/L (ref 7–177)
Total CK: 80 U/L (ref 7–177)
Troponin I: 2.08 ng/mL (ref <0.30)
Troponin I: 0.95 ng/mL (ref <0.30)

## 2012-01-22 LAB — BASIC METABOLIC PANEL
CO2: 23 mEq/L (ref 19–32)
Calcium: 8.2 mg/dL — ABNORMAL LOW (ref 8.4–10.5)
Creatinine, Ser: 2.19 mg/dL — ABNORMAL HIGH (ref 0.50–1.10)
GFR calc non Af Amer: 23 mL/min — ABNORMAL LOW (ref >90)
Glucose, Bld: 119 mg/dL — ABNORMAL HIGH (ref 70–99)

## 2012-01-22 LAB — POCT I-STAT 3, ART BLOOD GAS (G3+)
Bicarbonate: 22.2 mEq/L (ref 20.0–24.0)
TCO2: 23 mmol/L (ref 0–100)
pCO2 arterial: 35.8 mmHg (ref 35.0–45.0)
pH, Arterial: 7.397 (ref 7.350–7.400)
pO2, Arterial: 67 mmHg — ABNORMAL LOW (ref 80.0–100.0)

## 2012-01-22 LAB — MAGNESIUM: Magnesium: 1.9 mg/dL (ref 1.5–2.5)

## 2012-01-22 LAB — PROCALCITONIN: Procalcitonin: 3.47 ng/mL

## 2012-01-22 LAB — LACTIC ACID, PLASMA: Lactic Acid, Venous: 1.7 mmol/L (ref 0.5–2.2)

## 2012-01-22 MED ORDER — FUROSEMIDE 10 MG/ML IJ SOLN
INTRAMUSCULAR | Status: AC
  Filled 2012-01-22: qty 4

## 2012-01-22 MED ORDER — FUROSEMIDE 10 MG/ML IJ SOLN
40.0000 mg | Freq: Once | INTRAMUSCULAR | Status: AC
  Administered 2012-01-22: 40 mg via INTRAVENOUS

## 2012-01-22 MED ORDER — POTASSIUM CHLORIDE 10 MEQ/50ML IV SOLN
10.0000 meq | INTRAVENOUS | Status: AC
  Administered 2012-01-22 (×2): 10 meq via INTRAVENOUS
  Filled 2012-01-22 (×2): qty 50

## 2012-01-22 MED ORDER — AMIODARONE HCL 200 MG PO TABS
200.0000 mg | ORAL_TABLET | Freq: Every day | ORAL | Status: DC
  Administered 2012-01-22 – 2012-02-05 (×15): 200 mg via ORAL
  Filled 2012-01-22 (×15): qty 1

## 2012-01-22 MED ORDER — WARFARIN SODIUM 5 MG PO TABS
5.0000 mg | ORAL_TABLET | Freq: Once | ORAL | Status: AC
  Administered 2012-01-22: 5 mg via ORAL
  Filled 2012-01-22: qty 1

## 2012-01-22 MED ORDER — METOPROLOL TARTRATE 25 MG/10 ML ORAL SUSPENSION
12.5000 mg | Freq: Two times a day (BID) | ORAL | Status: DC
  Administered 2012-01-22 – 2012-01-23 (×3): 12.5 mg via ORAL
  Filled 2012-01-22 (×5): qty 5

## 2012-01-22 MED FILL — Albuterol Sulfate Soln Nebu 0.5% (5 MG/ML): RESPIRATORY_TRACT | Qty: 2 | Status: AC

## 2012-01-22 MED FILL — Propofol IV Emul 10 MG/ML: INTRAVENOUS | Qty: 100 | Status: AC

## 2012-01-22 NOTE — Progress Notes (Signed)
Pt still on sedation but now waking up and following commands appropriately.

## 2012-01-22 NOTE — Progress Notes (Signed)
ANTICOAGULATION CONSULT NOTE - Initial Consult  Pharmacy Consult for Coumadin Indication: atrial fibrillation  Allergies  Allergen Reactions  . Daptomycin Other (See Comments)    Elevated CPK  . Morphine And Related Nausea And Vomiting  . Keflex Rash    unknown  . Celecoxib     unknown  . Codeine     unknown  . Fluoxetine Hcl     unknown  . Latex     REACTION: undefined  . Ofloxacin     unknown  . Penicillins     unknown  . Rofecoxib     unknown  . Sulfonamide Derivatives     unknown    Patient Measurements: Height: 5\' 5"  (165.1 cm) Weight: 294 lb 5 oz (133.5 kg) IBW/kg (Calculated) : 57   Vital Signs: Temp: 98.7 F (37.1 C) (02/13 1200) BP: 167/66 mmHg (02/13 1226) Pulse Rate: 85  (02/13 1226)  Labs:  Basename 01/22/12 0600 01/22/12 0500 01/22/12 0427 01/21/12 2307 01/21/12 2059 01/21/12 1754 01/21/12 1633 01/21/12 1544  HGB -- 8.2* -- -- -- -- 10.5* --  HCT -- 26.8* -- -- -- -- 31.0* 37.5  PLT -- 211 -- -- -- -- -- 357  APTT -- -- -- -- -- -- -- 39*  LABPROT -- -- 22.8* -- -- 22.8* -- --  INR -- -- 1.97* -- -- 1.97* -- --  HEPARINUNFRC -- -- -- -- -- -- -- --  CREATININE -- 2.19* -- -- -- -- 1.50* 1.54*  CKTOTAL 94 -- -- 98 96 -- -- --  CKMB 6.4* -- -- 6.3* 5.6* -- -- --  TROPONINI 2.08* -- -- 1.96* 1.42* -- -- --   Estimated Creatinine Clearance: 37.3 ml/min (by C-G formula based on Cr of 2.19).  Medical History: Past Medical History  Diagnosis Date  . Asthma   . Bronchitis   . Allergic rhinitis   . Diabetes mellitus   . Stroke   . HTN (hypertension)   . OSA (obstructive sleep apnea)   . DJD (degenerative joint disease)   . Kidney stones   . Depression   . Hyperlipidemia   . Complication of anesthesia     trouble waking up    Medications:  See med rec  Assessment: 61 yo F admitted with SOB and intubated in ED. Patient recently admitted in December on Coumadin for AFib/hx CVA and Vancomycin, Aztreonam, and Azithromycin empiric for  HCAP.   Medication Review:  Anticoagulation: Coumadin PTA for Afib and CVA, Called Dr Annitta Jersey office on 2/13 and he states patient prescribed 5mg  po daily. INR 1.97 today (5mg  dose given 2/13).  Infectious Disease: Vancomycin, Aztreonam, and Azithromycin (2/12 >> present) empiric for HCAP (multiple allergies), MRSA PCR NEG, BCx and Sputum Cx pending. Afebrile. PCT 3.47. WBC up. (in Aug, on Vanc 1g q24, trough 21 with slightly worsened renal function).  Cardiovascular: Afib/HTN/DHF. BP elevated, HR 58-85. Troponin 2.08. ASA x1 dose, Lasix 40mg  x1 dose on 2/13, Metoprolol, Coumadin.  Endocrinology: CBGs <150. SSl.  Gastrointestinal / Nutrition: PPI IV (on at home), NPO currently. LFTs ok.  Neurology: Weaning off propofol. GCS improved.  Nephrology: SCr 2.19/estCrCl~72ml/min, Supplemented K.  Mag 1.9-not supplemented.  Pulmonary: Acute Respiratory Failure-Intubated in ED 2/12. Extubated 2/13, 98% on 4L [place].  Hematology / Oncology: Hgb low at 8.2, platelets ok.  Best Practices: Coumadin for VTE ppx, PPI for SUP  PTA Medication Issues: Not yet resumed- ASA, Allopurinol, Amiodarone, Lipitor, Albuterol, Clobetasol ointment, Digoxin, Diltiazem, Flonase, Lasix, Lantus, Levofloxacin, Spironolactone,  Ambien.   **Patient tells me she doesn't take Methotrexate anymore (?Psoriasis per Dr. Yetta Barre- however patient states has not seen Dr. Yetta Barre in years).    Goal of Therapy:  INR 2-3   Plan:  1. Coumadin 5mg  po once tonight at 1800. 2. Follow-up PT/INR.

## 2012-01-22 NOTE — Progress Notes (Signed)
  Echocardiogram 2D Echocardiogram has been performed.  [name] 01/22/2012, 2:13 PM

## 2012-01-22 NOTE — Procedures (Signed)
Extubation Procedure Note  Patient Details:   Name: [name] DOB: 06/23/1951 MRN: [id number]   Pt extubated to 4L [place], HR 80, RR 26, SpO2 97%, BP 169/59. Pt able to vocalize, no stridor noted, RT to monitor.   Evaluation  O2 sats: stable throughout Complications: No apparent complications Patient did tolerate procedure well. Bilateral Breath Sounds: Rhonchi (remains coarse after sx.'ing) Suctioning: Airway Yes  Harley Hallmark 01/22/2012, 10:47 AM

## 2012-01-22 NOTE — Progress Notes (Addendum)
Results of pts increasing troponin called to [facility],. Tropinin 2.08

## 2012-01-22 NOTE — ED Provider Notes (Signed)
I have personally seen and examined the patient.  I have discussed the plan of care with the resident.  I have reviewed the documentation on PMH/FH/Soc. History.  I have reviewed the documentation of the resident and agree.  Doug Sou, MD 01/22/12 1032

## 2012-01-22 NOTE — Consult Note (Signed)
Reason for Consult: Elevated troponin I. Referring Physician: CCM  [name] is an 61 y.o. female.  HPI: Patient is 61 year old female with past medical history significant for multiple medical problems i.e. 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 Patient states she went home and took nebulizer treatment and the 02 without much relief and called 911 and came to the ER her patient in ER was in acute pulmonary edema requiring intubation. Patient denies any chest pain nausea vomiting diaphoresis denies any cough fever or chills denies any palpitation lightheadedness or syncope. Her Cardiologic consultation is a trained as patient was noted to have progressive increasing troponin I. EKG done in the ER showed sinus bradycardia with minor ST-T wave changes inferolaterally. Patient was successfully extubated earlier today and states feeling better. Her troponin I. last set is trending down. Patient presently denies any chest pain nausea or vomiting or diaphoresis.  Past Medical History  Diagnosis Date  . Asthma   . Bronchitis   . Allergic rhinitis   . Diabetes mellitus   . Stroke   . HTN (hypertension)   . OSA (obstructive sleep apnea)   . DJD (degenerative joint disease)   . Kidney stones   . Depression   . Hyperlipidemia   . Complication of anesthesia     trouble waking up    Past Surgical History    Procedure Date  . Appendectomy   . Cholecystectomy   . Total knee arthroplasty   . Total abdominal hysterectomy   . Back surgery   . Cystectomy     left hand    Family History  Problem Relation Age of Onset  . Heart attack Father   . Asthma Father   . Heart disease Paternal Uncle   . Rectal cancer Paternal Aunt   . Other Mother     mva    Social History:  reports that she has quit smoking. Her smoking use included Cigarettes. She has never used smokeless tobacco. She reports that she does not drink alcohol or use illicit drugs.  Allergies:  Allergies  Allergen Reactions  . Daptomycin Other (See Comments)    Elevated CPK  . Morphine And Related Nausea And Vomiting  . Keflex Rash    unknown  . Celecoxib     unknown  . Codeine     unknown  . Fluoxetine Hcl     unknown  . Latex     REACTION: undefined  . Ofloxacin     unknown  . Penicillins     unknown  . Rofecoxib     unknown  . Sulfonamide Derivatives     unknown    Medications: I have reviewed the patient's current medications.  Results for orders placed during the hospital encounter of 01/21/12 (from the past 48 hour(s))  CBC     Status: Abnormal   Collection Time   01/21/12  3:44 PM      Component Value Range Comment   WBC 10.6 (*) 4.0 - 10.5 (K/uL)    RBC  4.26  3.87 - 5.11 (MIL/uL)    Hemoglobin 10.8 (*) 12.0 - 15.0 (g/dL)    HCT 16.1  09.6 - 04.5 (%)    MCV 88.0  78.0 - 100.0 (fL)    MCH 25.4 (*) 26.0 - 34.0 (pg)    MCHC 28.8 (*) 30.0 - 36.0 (g/dL)    RDW 40.9 (*) 81.1 - 15.5 (%)    Platelets 357  150 - 400 (K/uL)   DIFFERENTIAL     Status: Abnormal   Collection Time   01/21/12  3:44 PM      Component Value Range Comment   Neutrophils Relative 75  43 - 77 (%)    Lymphocytes Relative 17  12 - 46 (%)    Monocytes Relative 5  3 - 12 (%)    Eosinophils Relative 2  0 - 5 (%)    Basophils Relative 1  0 - 1 (%)    Neutro Abs 8.0 (*) 1.7 - 7.7 (K/uL)    Lymphs Abs 1.8  0.7 - 4.0 (K/uL)     Monocytes Absolute 0.5  0.1 - 1.0 (K/uL)    Eosinophils Absolute 0.2  0.0 - 0.7 (K/uL)    Basophils Absolute 0.1  0.0 - 0.1 (K/uL)    RBC Morphology POLYCHROMASIA PRESENT      WBC Morphology MILD LEFT SHIFT (1-5% METAS, OCC MYELO, OCC BANDS)      Smear Review        Value: PLATELET CLUMPS NOTED ON SMEAR, COUNT APPEARS ADEQUATE  COMPREHENSIVE METABOLIC PANEL     Status: Abnormal   Collection Time   01/21/12  3:44 PM      Component Value Range Comment   Sodium 138  135 - 145 (mEq/L)    Potassium 6.0 (*) 3.5 - 5.1 (mEq/L) HEMOLYSIS AT THIS LEVEL MAY AFFECT RESULT   Chloride 104  96 - 112 (mEq/L)    CO2 20  19 - 32 (mEq/L)    Glucose, Bld 320 (*) 70 - 99 (mg/dL)    BUN 22  6 - 23 (mg/dL)    Creatinine, Ser 9.14 (*) 0.50 - 1.10 (mg/dL)    Calcium 9.3  8.4 - 10.5 (mg/dL)    Total Protein 8.2  6.0 - 8.3 (g/dL)    Albumin 3.3 (*) 3.5 - 5.2 (g/dL)    AST 50 (*) 0 - 37 (U/L) HEMOLYSIS AT THIS LEVEL MAY AFFECT RESULT   ALT 15  0 - 35 (U/L) HEMOLYSIS AT THIS LEVEL MAY AFFECT RESULT   Alkaline Phosphatase 96  39 - 117 (U/L) HEMOLYSIS AT THIS LEVEL MAY AFFECT RESULT   Total Bilirubin 0.3  0.3 - 1.2 (mg/dL)    GFR calc non Af Amer 35 (*) >90 (mL/min)    GFR calc Af Amer 41 (*) >90 (mL/min)   PRO B NATRIURETIC PEPTIDE     Status: Abnormal   Collection Time   01/21/12  3:44 PM      Component Value Range Comment   Pro B Natriuretic peptide (BNP) 824.9 (*) 0 - 125 (pg/mL)   TROPONIN I     Status: Abnormal   Collection Time   01/21/12  3:44 PM      Component Value Range Comment   Troponin I 0.40 (*) <0.30 (ng/mL)   APTT     Status: Abnormal   Collection Time   01/21/12  3:44 PM      Component Value Range Comment   aPTT 39 (*) 24 - 37 (seconds)  LACTIC ACID, PLASMA     Status: Abnormal   Collection Time   01/21/12  4:08 PM      Component Value Range Comment   Lactic Acid, Venous 4.0 (*) 0.5 - 2.2 (mmol/L)   GLUCOSE, CAPILLARY     Status: Abnormal   Collection Time   01/21/12  4:17 PM       Component Value Range Comment   Glucose-Capillary 352 (*) 70 - 99 (mg/dL)   POCT I-STAT 3, BLOOD GAS (G3+)     Status: Abnormal   Collection Time   01/21/12  4:30 PM      Component Value Range Comment   pH, Arterial 7.191 (*) 7.350 - 7.400     pCO2 arterial 53.0 (*) 35.0 - 45.0 (mmHg)    pO2, Arterial 63.0 (*) 80.0 - 100.0 (mmHg)    Bicarbonate 20.3  20.0 - 24.0 (mEq/L)    TCO2 22  0 - 100 (mmol/L)    O2 Saturation 86.0      Acid-base deficit 8.0 (*) 0.0 - 2.0 (mmol/L)    Patient temperature 98.6 F      Collection site RADIAL, ALLEN'S TEST ACCEPTABLE      Sample type ARTERIAL     POCT I-STAT, CHEM 8     Status: Abnormal   Collection Time   01/21/12  4:33 PM      Component Value Range Comment   Sodium 144  135 - 145 (mEq/L)    Potassium 3.8  3.5 - 5.1 (mEq/L)    Chloride 110  96 - 112 (mEq/L)    BUN 25 (*) 6 - 23 (mg/dL)    Creatinine, Ser 1.61 (*) 0.50 - 1.10 (mg/dL)    Glucose, Bld 096 (*) 70 - 99 (mg/dL)    Calcium, Ion 0.45 (*) 1.12 - 1.32 (mmol/L)    TCO2 23  0 - 100 (mmol/L)    Hemoglobin 10.5 (*) 12.0 - 15.0 (g/dL)    HCT 40.9 (*) 81.1 - 46.0 (%)   URINALYSIS, ROUTINE W REFLEX MICROSCOPIC     Status: Abnormal   Collection Time   01/21/12  4:36 PM      Component Value Range Comment   Color, Urine YELLOW  YELLOW     APPearance CLEAR  CLEAR     Specific Gravity, Urine 1.013  1.005 - 1.030     pH 7.0  5.0 - 8.0     Glucose, UA 500 (*) NEGATIVE (mg/dL)    Hgb urine dipstick SMALL (*) NEGATIVE     Bilirubin Urine NEGATIVE  NEGATIVE     Ketones, ur NEGATIVE  NEGATIVE (mg/dL)    Protein, ur >914 (*) NEGATIVE (mg/dL)    Urobilinogen, UA 0.2  0.0 - 1.0 (mg/dL)    Nitrite NEGATIVE  NEGATIVE     Leukocytes, UA NEGATIVE  NEGATIVE    URINE MICROSCOPIC-ADD ON     Status: Abnormal   Collection Time   01/21/12  4:36 PM      Component Value Range Comment   Squamous Epithelial / LPF FEW (*) RARE     WBC, UA 3-6  <3 (WBC/hpf)    RBC / HPF 3-6  <3 (RBC/hpf)    Bacteria, UA RARE   RARE    PROTIME-INR     Status: Abnormal   Collection Time   01/21/12  5:54 PM      Component Value Range Comment   Prothrombin Time 22.8 (*) 11.6 - 15.2 (seconds)    INR 1.97 (*)  0.00 - 1.49    MRSA PCR SCREENING     Status: Normal   Collection Time   01/21/12  7:09 PM      Component Value Range Comment   MRSA by PCR NEGATIVE  NEGATIVE    CARDIAC PANEL(CRET KIN+CKTOT+MB+TROPI)     Status: Abnormal   Collection Time   01/21/12  8:59 PM      Component Value Range Comment   Total CK 96  7 - 177 (U/L)    CK, MB 5.6 (*) 0.3 - 4.0 (ng/mL)    Troponin I 1.42 (*) <0.30 (ng/mL)    Relative Index RELATIVE INDEX IS INVALID  0.0 - 2.5    GLUCOSE, CAPILLARY     Status: Abnormal   Collection Time   01/21/12 10:25 PM      Component Value Range Comment   Glucose-Capillary 189 (*) 70 - 99 (mg/dL)   CARDIAC PANEL(CRET KIN+CKTOT+MB+TROPI)     Status: Abnormal   Collection Time   01/21/12 11:07 PM      Component Value Range Comment   Total CK 98  7 - 177 (U/L)    CK, MB 6.3 (*) 0.3 - 4.0 (ng/mL)    Troponin I 1.96 (*) <0.30 (ng/mL)    Relative Index RELATIVE INDEX IS INVALID  0.0 - 2.5    LACTIC ACID, PLASMA     Status: Normal   Collection Time   01/21/12 11:30 PM      Component Value Range Comment   Lactic Acid, Venous 1.7  0.5 - 2.2 (mmol/L)   PROCALCITONIN     Status: Normal   Collection Time   01/21/12 11:30 PM      Component Value Range Comment   Procalcitonin 3.47     PRO B NATRIURETIC PEPTIDE     Status: Abnormal   Collection Time   01/21/12 11:30 PM      Component Value Range Comment   Pro B Natriuretic peptide (BNP) 1741.0 (*) 0 - 125 (pg/mL)   GLUCOSE, CAPILLARY     Status: Abnormal   Collection Time   01/22/12 12:07 AM      Component Value Range Comment   Glucose-Capillary 130 (*) 70 - 99 (mg/dL)    Comment 1 Documented in Chart      Comment 2 Notify RN     PROTIME-INR     Status: Abnormal   Collection Time   01/22/12  4:27 AM      Component Value Range Comment   Prothrombin  Time 22.8 (*) 11.6 - 15.2 (seconds)    INR 1.97 (*) 0.00 - 1.49    GLUCOSE, CAPILLARY     Status: Abnormal   Collection Time   01/22/12  4:30 AM      Component Value Range Comment   Glucose-Capillary 116 (*) 70 - 99 (mg/dL)   POCT I-STAT 3, BLOOD GAS (G3+)     Status: Abnormal   Collection Time   01/22/12  4:41 AM      Component Value Range Comment   pH, Arterial 7.397  7.350 - 7.400     pCO2 arterial 35.8  35.0 - 45.0 (mmHg)    pO2, Arterial 67.0 (*) 80.0 - 100.0 (mmHg)    Bicarbonate 22.2  20.0 - 24.0 (mEq/L)    TCO2 23  0 - 100 (mmol/L)    O2 Saturation 94.0      Acid-base deficit 3.0 (*) 0.0 - 2.0 (mmol/L)    Patient temperature 97.3 F  Collection site RADIAL, ALLEN'S TEST ACCEPTABLE      Drawn by RT      Sample type ARTERIAL     CBC     Status: Abnormal   Collection Time   01/22/12  5:00 AM      Component Value Range Comment   WBC 13.9 (*) 4.0 - 10.5 (K/uL)    RBC 3.10 (*) 3.87 - 5.11 (MIL/uL)    Hemoglobin 8.2 (*) 12.0 - 15.0 (g/dL) DELTA CHECK NOTED   HCT 26.8 (*) 36.0 - 46.0 (%)    MCV 86.5  78.0 - 100.0 (fL)    MCH 26.5  26.0 - 34.0 (pg)    MCHC 30.6  30.0 - 36.0 (g/dL)    RDW 16.1 (*) 09.6 - 15.5 (%)    Platelets 211  150 - 400 (K/uL) DELTA CHECK NOTED  BASIC METABOLIC PANEL     Status: Abnormal   Collection Time   01/22/12  5:00 AM      Component Value Range Comment   Sodium 142  135 - 145 (mEq/L)    Potassium 4.0  3.5 - 5.1 (mEq/L)    Chloride 109  96 - 112 (mEq/L)    CO2 23  19 - 32 (mEq/L)    Glucose, Bld 119 (*) 70 - 99 (mg/dL)    BUN 29 (*) 6 - 23 (mg/dL)    Creatinine, Ser 0.45 (*) 0.50 - 1.10 (mg/dL)    Calcium 8.2 (*) 8.4 - 10.5 (mg/dL)    GFR calc non Af Amer 23 (*) >90 (mL/min)    GFR calc Af Amer 27 (*) >90 (mL/min)   MAGNESIUM     Status: Normal   Collection Time   01/22/12  5:00 AM      Component Value Range Comment   Magnesium 1.9  1.5 - 2.5 (mg/dL)   PHOSPHORUS     Status: Normal   Collection Time   01/22/12  5:00 AM      Component  Value Range Comment   Phosphorus 3.9  2.3 - 4.6 (mg/dL)   CARDIAC PANEL(CRET KIN+CKTOT+MB+TROPI)     Status: Abnormal   Collection Time   01/22/12  6:00 AM      Component Value Range Comment   Total CK 94  7 - 177 (U/L)    CK, MB 6.4 (*) 0.3 - 4.0 (ng/mL) CRITICAL VALUE NOTED.  VALUE IS CONSISTENT WITH PREVIOUSLY REPORTED AND CALLED VALUE.   Troponin I 2.08 (*) <0.30 (ng/mL)    Relative Index RELATIVE INDEX IS INVALID  0.0 - 2.5    GLUCOSE, CAPILLARY     Status: Abnormal   Collection Time   01/22/12  8:04 AM      Component Value Range Comment   Glucose-Capillary 113 (*) 70 - 99 (mg/dL)   GLUCOSE, CAPILLARY     Status: Abnormal   Collection Time   01/22/12 11:39 AM      Component Value Range Comment   Glucose-Capillary 133 (*) 70 - 99 (mg/dL)   CARDIAC PANEL(CRET KIN+CKTOT+MB+TROPI)     Status: Abnormal   Collection Time   01/22/12  2:46 PM      Component Value Range Comment   Total CK 80  7 - 177 (U/L)    CK, MB 4.4 (*) 0.3 - 4.0 (ng/mL)    Troponin I 1.17 (*) <0.30 (ng/mL)    Relative Index RELATIVE INDEX IS INVALID  0.0 - 2.5    GLUCOSE, CAPILLARY     Status: Abnormal  Collection Time   01/22/12  4:11 PM      Component Value Range Comment   Glucose-Capillary 105 (*) 70 - 99 (mg/dL)     Portable Chest Xray In Am  01/22/2012  *RADIOLOGY REPORT*  Clinical Data: Evaluate endotracheal tube positioning and pulmonary edema  PORTABLE CHEST - 1 VIEW  Comparison: 01/21/2012; 01/25/2012; 03/25/2011  Findings: Grossly unchanged enlarged cardiac silhouette and mediastinal contours.  Endotracheal tube overlies tracheal air column, tip superior to the carina.  Interval placement of enteric tube with tip terminating inferior to the left hemidiaphragm.  No significant interval change and bilateral diffuse consolidative opacities.  No definite pneumothorax or pleural effusions.  Grossly unchanged bones.  IMPRESSION: 1.  Endotracheal tube overlies tracheal air column with tip superior to the carina.   No pneumothorax. 2.  Interval placement of enteric tube with tip inferior to the left hemidiaphragm. 3.  No significant interval change in diffuse bilateral consolidative opacities. Differential considerations remain broad, including alveolar pulmonary edema, multifocal infection or pulmonary hemorrhage.  Original Report Authenticated By: Waynard Reeds, M.D.   Dg Chest Port 1 View  01/21/2012  *RADIOLOGY REPORT*  Clinical Data: Shortness of breath.  Endotracheal tube placement  PORTABLE CHEST - 1 VIEW  Comparison: Chest x-ray 11/24/2011.  Findings: Endotracheal tube has been placed with tip at the level of the thoracic inlet.  The lung volumes are low.  There is widespread airspace consolidation throughout all regions of the lungs bilaterally.  This completely obscures the underlying pulmonary vascularity.  Moderate enlargement of the cardiopericardial silhouette (slightly increased compared to prior).  Widening of mediastinal contours, likely accentuated by low lung volumes and slight patient rotation to the right.  IMPRESSION:  1.  Endotracheal tube appears properly located. 2.  Severe widespread air space consolidation throughout all aspects of the lungs bilaterally.  This may reflect pulmonary edema and/or multilobar pneumonia. 3.  Moderate enlargement of the cardiopericardial silhouette which may reflect underlying cardiomegaly and/or the presence of a pericardial effusion.  Original Report Authenticated By: Florencia Reasons, M.D.   Dg Pneumonia Chest Port1v  01/22/2012  *RADIOLOGY REPORT*  Clinical Data: Evaluate infiltrates versus effusions  PORTABLE CHEST - 1 VIEW  Comparison: Earlier same day; 01/21/2012  Findings:  Grossly unchanged enlarged cardiac silhouette and mediastinal contours.  Stable position of support apparatus.  Overall marked improved aeration of the bilateral lungs with persistent mid and lower lung heterogeneous air space opacities.  Pulmonary vasculature remains indistinct.  No  definite pleural effusion, though note, examination is degraded secondary to exclusion of the bilateral costophrenic angles.  No definite pneumothorax.  Grossly unchanged bones.  IMPRESSION: 1.  Stable positioning of support apparatus.  No pneumothorax. 2.  Overall improved aeration of the lungs suggestive of improving pulmonary edema. 3.  Residual bibasilar airspace opacities favored to represent atelectasis and residual edema.  Original Report Authenticated By: Waynard Reeds, M.D.    Review of Systems  Constitutional: Negative for fever and chills.  Eyes: Negative for blurred vision and double vision.  Respiratory: Positive for cough and shortness of breath. Negative for hemoptysis and sputum production.   Cardiovascular: Negative for chest pain, palpitations and orthopnea.  Gastrointestinal: Negative for nausea, vomiting and abdominal pain.  Musculoskeletal: Positive for back pain and joint pain.  Skin: Negative for rash.  Neurological: Negative for dizziness and headaches.   Blood pressure 139/51, pulse 73, temperature 100.2 F (37.9 C), temperature source Other (Comment), resp. rate 22, height 5\' 5"  (1.651  m), weight 133.5 kg (294 lb 5 oz), SpO2 98.00%. Physical Exam  Constitutional: She appears well-developed and well-nourished.  HENT:  Head: Normocephalic.  Eyes: Conjunctivae are normal. Left eye exhibits no discharge. No scleral icterus.  Neck: Neck supple. No JVD present. No thyromegaly present.  Cardiovascular:       Regular rate and rhythm S1-S2 soft there is soft systolic murmur and S3 gallop.  Respiratory:       Decreased breath sounds at bases with occasional faint rales and rhonchi  GI: Soft. Bowel sounds are normal. She exhibits distension. There is no tenderness. There is no rebound.  Musculoskeletal:       No clubbing cyanosis 2+ edema with chronic dermatosis  Lymphadenopathy:    She has no cervical adenopathy.    Assessment/Plan: Probable very small non-Q-wave  myocardial infarction due to elevated troponin I. Decompensated systolic/diastolic heart failure Status post acute respiratory failure rule out aspiration pneumonia Hypertension History of pontine CVA COPD Obstructive sleep apnea obesity hypoventilation syndrome History of bronchial asthma History of A. fib/flutter status post TEE cardioversion in the past patient felt not to be the candidate for flutter ablation in view of multiple comorbidities Septic prosthetic left knee joint History of psoriasis Acute on chronic anemia rule out GI loss Chronic kidney disease stage 3/4 Morbid obesity Hypercholesteremia Plan Check EKG and troponin I. and CPK-MB in a.m. Check stool for occult blood Restart amiodarone per orders Schedule for nuclear stress test today protocol rest films tomorrow Start heparin per pharmacy and hold Coumadin for now pending stress test results  Tully Burgo N 01/22/2012, 6:50 PM

## 2012-01-22 NOTE — Clinical Documentation Improvement (Signed)
Hypertension Documentation Clarification Query  THIS DOCUMENT IS NOT A PERMANENT PART OF THE MEDICAL RECORD  TO RESPOND TO THE THIS QUERY, FOLLOW THE INSTRUCTIONS BELOW:  1. If needed, update documentation for the patient's encounter via the notes activity.  2. Access this query again and click edit on the In [employer].  3. After updating, or not, click F2 to complete all highlighted (required) fields concerning your review. Select "additional documentation in the medical record" OR "no additional documentation provided".  4. Click Sign note button.  5. The deficiency will fall out of your In Basket 6. Please let us know if you are not able to complete this workflow by phone or e-mail (listed below).        01/22/12  Dear Dr. Marton Redwood  In an effort to better capture your patient's severity of illness, reflect appropriate length of stay and utilization of resources, a review of the patient medical record has revealed the following indicators.    Based on your clinical judgment, please clarify and document in a progress note and/or discharge summary the clinical condition associated with the following supporting information:  In responding to this query please exercise your independent judgment.  The fact that a query is asked, does not imply that any particular answer is desired or expected.  Possible Clinical Condition?   Accelerated Hypertension   Malignant Hypertension   Or Other Condition __________________________   Cannot Clinically Determine   Supporting Information:per progress note:  Pulmonary edema on admission in setting of hypertensive emergency with significant resolution with lasix use.   Risk Factors:  Signs and Symptoms: SBP range: DBP range: Encephalopathy?  Diagnostics: Troponin level: CPK:  CPKMB: Echo: Radiology: Treatment:  Drips: _______________ IV Medications: __________________   Bonita Quin may use possible, probable, or suspect with  inpatient documentation. Possible, probable, suspected diagnoses MUST be documented at the time of discharge.  Reviewed:  no additional documentation provided  Thank You,  Amada Kingfisher  Clinical Documentation Specialist: [phone] Pager  Health Information Management [place]

## 2012-01-23 ENCOUNTER — Inpatient Hospital Stay (HOSPITAL_COMMUNITY): Payer: Medicare HMO

## 2012-01-23 DIAGNOSIS — I10 Essential (primary) hypertension: Secondary | ICD-10-CM

## 2012-01-23 DIAGNOSIS — I509 Heart failure, unspecified: Secondary | ICD-10-CM

## 2012-01-23 DIAGNOSIS — J9600 Acute respiratory failure, unspecified whether with hypoxia or hypercapnia: Secondary | ICD-10-CM

## 2012-01-23 LAB — BASIC METABOLIC PANEL
BUN: 30 mg/dL — ABNORMAL HIGH (ref 6–23)
Creatinine, Ser: 2.56 mg/dL — ABNORMAL HIGH (ref 0.50–1.10)
GFR calc Af Amer: 22 mL/min — ABNORMAL LOW (ref >90)
GFR calc non Af Amer: 19 mL/min — ABNORMAL LOW (ref >90)
Potassium: 4.2 mEq/L (ref 3.5–5.1)

## 2012-01-23 LAB — CBC
HCT: 28.7 % — ABNORMAL LOW (ref 36.0–46.0)
MCHC: 29.6 g/dL — ABNORMAL LOW (ref 30.0–36.0)
RDW: 20.8 % — ABNORMAL HIGH (ref 11.5–15.5)

## 2012-01-23 LAB — GLUCOSE, CAPILLARY
Glucose-Capillary: 125 mg/dL — ABNORMAL HIGH (ref 70–99)
Glucose-Capillary: 142 mg/dL — ABNORMAL HIGH (ref 70–99)
Glucose-Capillary: 151 mg/dL — ABNORMAL HIGH (ref 70–99)
Glucose-Capillary: 154 mg/dL — ABNORMAL HIGH (ref 70–99)

## 2012-01-23 LAB — CARDIAC PANEL(CRET KIN+CKTOT+MB+TROPI)
Relative Index: INVALID (ref 0.0–2.5)
Troponin I: 0.73 ng/mL (ref <0.30)

## 2012-01-23 LAB — PROTIME-INR
INR: 2.16 — ABNORMAL HIGH (ref 0.00–1.49)
Prothrombin Time: 24.5 seconds — ABNORMAL HIGH (ref 11.6–15.2)

## 2012-01-23 LAB — POCT I-STAT 3, ART BLOOD GAS (G3+)
Bicarbonate: 24.4 mEq/L — ABNORMAL HIGH (ref 20.0–24.0)
pCO2 arterial: 37.4 mmHg (ref 35.0–45.0)
pO2, Arterial: 59 mmHg — ABNORMAL LOW (ref 80.0–100.0)

## 2012-01-23 LAB — STREP PNEUMONIAE URINARY ANTIGEN: Strep Pneumo Urinary Antigen: NEGATIVE

## 2012-01-23 MED ORDER — FUROSEMIDE 10 MG/ML IJ SOLN
40.0000 mg | Freq: Three times a day (TID) | INTRAMUSCULAR | Status: AC
  Administered 2012-01-23: 40 mg via INTRAVENOUS

## 2012-01-23 MED ORDER — FUROSEMIDE 10 MG/ML IJ SOLN
INTRAMUSCULAR | Status: AC
  Filled 2012-01-23: qty 4

## 2012-01-23 MED ORDER — METOPROLOL TARTRATE 50 MG PO TABS
50.0000 mg | ORAL_TABLET | Freq: Two times a day (BID) | ORAL | Status: DC
  Administered 2012-01-23 – 2012-01-28 (×11): 50 mg via ORAL
  Filled 2012-01-23 (×13): qty 1

## 2012-01-23 MED ORDER — FUROSEMIDE 10 MG/ML IJ SOLN
INTRAMUSCULAR | Status: AC
  Administered 2012-01-23: 40 mg
  Filled 2012-01-23: qty 4

## 2012-01-23 MED FILL — Chlorhexidine Gluconate Soln 0.12%: OROMUCOSAL | Qty: 15 | Status: AC

## 2012-01-23 NOTE — Evaluation (Signed)
Physical Therapy Evaluation Patient Details Name: [name] MRN: [id number] DOB: 03-11-1951 Today's Date: 01/23/2012  Problem List:  Patient Active Problem List  Diagnoses  . HYPERLIPIDEMIA  . OBSTRUCTIVE SLEEP APNEA  . HYPERTENSION  . ATRIAL FIBRILLATION WITH RAPID VENTRICULAR RESPONSE  . Acute combined systolic and diastolic heart failure  . 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 BRONCHITIS, RECURRENT  . ASTHMA  . Obesity hypoventilation syndrome  . Prosthetic joint infection  . Group B streptococcal infection  . Gout  . Yeast infection involving the vagina and surrounding area  . SBO (small bowel obstruction)  . DM (diabetes mellitus)  . Chronic anticoagulation  . Oral candidiasis    Past Medical History:  Past Medical History  Diagnosis Date  . Asthma   . Bronchitis   . Allergic rhinitis   . Diabetes mellitus   . Stroke   . HTN (hypertension)   . OSA (obstructive sleep apnea)   . DJD (degenerative joint disease)   . Kidney stones   . Depression   . Hyperlipidemia   . Complication of anesthesia     trouble waking up   Past Surgical History:  Past Surgical History  Procedure Date  . Appendectomy   . Cholecystectomy   . Total knee arthroplasty   . Total abdominal hysterectomy   . Back surgery   . Cystectomy     left hand    PT Assessment/Plan/Recommendation PT Assessment Clinical Impression Statement: Pt presented with respiratory failure and pulmonary edema s/p VDRF. Pt currently limited by respiratory status with static positioning between each segment of transfer or extremity movement. Pt very pleasant and wanting to improve mobility. Pt will benefit from acute therapy to maximize mobility, activity tolerance and independence prior to discharge.  PT Recommendation/Assessment: Patient will need skilled PT in the acute care venue PT Problem List: Decreased strength;Decreased mobility;Cardiopulmonary status limiting activity;Decreased activity tolerance Barriers to  Discharge: None Barriers to Discharge Comments: Pt states spouse can assist 24 hrs if needed PT Therapy Diagnosis : Generalized weakness PT Plan PT Frequency: Min 3X/week PT Treatment/Interventions: Gait training;DME instruction;Therapeutic exercise;Therapeutic activities;Stair training;Functional mobility training;Patient/family education PT Recommendation Recommendations for Other Services: OT consult Follow Up Recommendations: Home health PT & Supervision for mobility/OOB if able to progress toward goals otherwise Skilled nursing facility may be needed Equipment Recommended: None recommended by PT PT Goals  Acute Rehab PT Goals PT Goal Formulation: With patient Time For Goal Achievement: 2 weeks Pt will go Supine/Side to Sit: with supervision PT Goal: Supine/Side to Sit - Progress: Goal set today Pt will go Sit to Supine/Side: with min assist PT Goal: Sit to Supine/Side - Progress: Goal set today Pt will go Sit to Stand: with min assist PT Goal: Sit to Stand - Progress: Goal set today Pt will go Stand to Sit: with min assist PT Goal: Stand to Sit - Progress: Goal set today Pt will Transfer Bed to Chair/Chair to Bed: with min assist PT Transfer Goal: Bed to Chair/Chair to Bed - Progress: Goal set today Pt will Ambulate: 51 - 150 feet;with least restrictive assistive device;with min assist PT Goal: Ambulate - Progress: Goal set today Pt will Go Up / Down Stairs: 1-2 stairs;with min assist;with least restrictive assistive device PT Goal: Up/Down Stairs - Progress: Goal set today  PT Evaluation Precautions/Restrictions  Precautions Precautions: Fall Precaution Comments: O2 Prior Functioning  Home Living Lives With: Spouse Type of Home: House Home Layout: One level Home Access: Stairs to enter [employer]  of Steps: 2 Bathroom Shower/Tub: Tub/shower unit;[employer] Firefighter: Standard Bathroom Accessibility: Yes How Accessible: Accessible via walker Home  Adaptive Equipment: Walker - rolling Prior Function Level of Independence: Independent with basic ADLs;Independent with homemaking with ambulation;Requires assistive device for independence;Independent with transfers (pt uses a RW) Driving: No Vocation: Unemployed Financial risk analyst Arousal/Alertness: Awake/alert Overall Cognitive Status: Appears within functional limits for tasks assessed Sensation/Coordination Sensation Light Touch: Appears Intact Extremity Assessment RLE Assessment RLE Assessment: Exceptions to [facility] RLE Strength RLE Overall Strength Comments: grossly 2/5 unable to formally assess and ROM limited by body habitus LLE Assessment LLE Assessment: Exceptions to [facility] LLE Strength LLE Overall Strength Comments: grossly 2/5 unable to formally assess and ROM limited by body habitus Mobility (including Balance) Bed Mobility Rolling Left: 5: Supervision;With rail Rolling Left Details (indicate cue type and reason): HOB 20 degrees and cues to complete. Pt with increased time in sidelying to recover before progressing to sitting Left Sidelying to Sit: 4: Min assist;HOB elevated (comment degrees);With rails Left Sidelying to Sit Details (indicate cue type and reason): HOB 20 degrees. Cueing and physical assist to complete Sit to Supine: 3: Mod assist;HOB flat Sit to Supine - Details (indicate cue type and reason): cueing to complete and assist to elevate bil LE onto surface Scooting to HOB: 3: Mod assist;With rail Scooting to [facility] Details (indicate cue type and reason): trendelenburg Transfers Transfers: No Ambulation/Gait Ambulation/Gait: No  Balance Balance Assessed: Yes Static Sitting Balance Static Sitting - Balance Support: Bilateral upper extremity supported;Feet unsupported Static Sitting - Level of Assistance: 6: Modified independent (Device/Increase time) Static Sitting - Comment/# of Minutes: 5 Exercise  General Exercises - Upper Extremity Shoulder Flexion:  AROM;Both;5 reps;Supine General Exercises - Lower Extremity Heel Slides: AROM;Both;5 reps;Supine End of Session PT - End of Session Activity Tolerance: Patient limited by fatigue;Treatment limited secondary to medical complications (Comment) (respiratory status limits mobility) Patient left: in bed;with call bell in reach Nurse Communication: Mobility status for transfers General Behavior During Session: [facility] for tasks performed Cognition: [facility] for tasks performed  Delorse Lek 01/23/2012, 4:55 PM  Toney Sang, PT [phone]

## 2012-01-23 NOTE — Progress Notes (Signed)
Subjective:  Patient denies any chest pain states breathing has improved Cardiac enzymes are trending down. 2-D echo showed no   regional wall motion abnormalities   Objective:  Vital Signs in the last 24 hours: Temp:  [98.8 F (37.1 C)-100.3 F (37.9 C)] 99.3 F (37.4 C) (02/14 1500) Pulse Rate:  [69-95] 95  (02/14 1500) Resp:  [15-37] 19  (02/14 1500) BP: (139-207)/(48-82) 191/74 mmHg (02/14 1500) SpO2:  [95 %-99 %] 97 % (02/14 1644)  Intake/Output from previous day: 02/13 0701 - 02/14 0700 In: 1410.3 [I.V.:410.3; IV Piggyback:1000] Out: 2950 [Urine:2950] Intake/Output from this shift: Total I/O In: 190 [I.V.:140; IV Piggyback:50] Out: 2330 [Urine:2330]  Physical Exam: General appearance: alert and cooperative Neck: no adenopathy, no carotid bruit, no JVD and supple, symmetrical, trachea midline Lungs: Decreased breath sounds at bases with occasional rhonchi and rales present Heart: regular rate and rhythm, S1, S2 normal and Soft systolic murmur and S3 gallop noted Abdomen: soft, non-tender; bowel sounds normal; no masses,  no organomegaly Extremities: No clubbing cyanosis 1+ edema with chronic dermatosis changes noted  Lab Results:  Basename 01/23/12 0500 01/22/12 0500  WBC 10.0 13.9*  HGB 8.5* 8.2*  PLT 230 211    Basename 01/23/12 0500 01/22/12 0500  NA 143 142  K 4.2 4.0  CL 109 109  CO2 26 23  GLUCOSE 146* 119*  BUN 30* 29*  CREATININE 2.56* 2.19*    Basename 01/23/12 0500 01/22/12 2221  TROPONINI 0.73* 0.95*   Hepatic Function Panel  Basename 01/21/12 1544  PROT 8.2  ALBUMIN 3.3*  AST 50*  ALT 15  ALKPHOS 96  BILITOT 0.3  BILIDIR --  IBILI --   No results found for this basename: CHOL in the last 72 hours No results found for this basename: PROTIME in the last 72 hours  Imaging: Imaging results have been reviewed and US Renal Port  01/23/2012  *RADIOLOGY REPORT*  Clinical Data: 61 year old female with acute on chronic kidney disease.   ULTRASOUND PORTABLE RENAL  Technique:  Complete ultrasound examination of the urinary tract was performed including evaluation of the kidneys, renal collecting systems, and urinary bladder.  Comparison: CT abdomen pelvis 11/23/2011.  Findings:  Right Kidney:  No hydronephrosis.  Renal length 12.4 cm.  Stable atrophy/cortical thinning.  Increased cortical echotexture.  Stable small simple appearing cysts up to 18 mm in diameter.  Left kidney: Suboptimal detail due to body habitus.  No hydronephrosis.  Renal length 12.9 cm.  Cortical thickness and echotexture better preserved and similar to the prior CT.  Bladder:  Not visualized, reportedly a Foley catheter is in place.  IMPRESSION: No definite acute renal findings.  Chronic right renal atrophy.  Original Report Authenticated By: Harley Hallmark, M.D.   Portable Chest Xray In Am  01/22/2012  *RADIOLOGY REPORT*  Clinical Data: Evaluate endotracheal tube positioning and pulmonary edema  PORTABLE CHEST - 1 VIEW  Comparison: 01/21/2012; 01/25/2012; 03/25/2011  Findings: Grossly unchanged enlarged cardiac silhouette and mediastinal contours.  Endotracheal tube overlies tracheal air column, tip superior to the carina.  Interval placement of enteric tube with tip terminating inferior to the left hemidiaphragm.  No significant interval change and bilateral diffuse consolidative opacities.  No definite pneumothorax or pleural effusions.  Grossly unchanged bones.  IMPRESSION: 1.  Endotracheal tube overlies tracheal air column with tip superior to the carina.  No pneumothorax. 2.  Interval placement of enteric tube with tip inferior to the left hemidiaphragm. 3.  No significant interval change in  diffuse bilateral consolidative opacities. Differential considerations remain broad, including alveolar pulmonary edema, multifocal infection or pulmonary hemorrhage.  Original Report Authenticated By: Waynard Reeds, M.D.   Dg Pneumonia Chest Port1v  01/23/2012  *RADIOLOGY  REPORT*  Clinical Data: 61 year old female with shortness of breath.  PORTABLE CHEST - 1 VIEW  Comparison: 01/22/2012 and earlier.  Findings: Portable semi upright AP view 0558 hours.  The patient is extubated and enteric tube removed.  Interval increased nodular and confluent bilateral pulmonary opacity, maximal in the right upper lung.  Lung volumes appear slightly lower. Stable cardiomegaly and mediastinal contours.  No pneumothorax.  No large effusion.  IMPRESSION: 1.  Extubated and enteric tube removed. 2.  Increased bilateral nodular and confluent opacity. Differential considerations include acute/worsening pulmonary edema, less likely increased infection.  Original Report Authenticated By: Harley Hallmark, M.D.   Dg Pneumonia Chest Port1v  01/22/2012  *RADIOLOGY REPORT*  Clinical Data: Evaluate infiltrates versus effusions  PORTABLE CHEST - 1 VIEW  Comparison: Earlier same day; 01/21/2012  Findings:  Grossly unchanged enlarged cardiac silhouette and mediastinal contours.  Stable position of support apparatus.  Overall marked improved aeration of the bilateral lungs with persistent mid and lower lung heterogeneous air space opacities.  Pulmonary vasculature remains indistinct.  No definite pleural effusion, though note, examination is degraded secondary to exclusion of the bilateral costophrenic angles.  No definite pneumothorax.  Grossly unchanged bones.  IMPRESSION: 1.  Stable positioning of support apparatus.  No pneumothorax. 2.  Overall improved aeration of the lungs suggestive of improving pulmonary edema. 3.  Residual bibasilar airspace opacities favored to represent atelectasis and residual edema.  Original Report Authenticated By: Waynard Reeds, M.D.    Cardiac Studies:  Assessment/Plan:  Probable very small non-Q-wave myocardial infarction Resolving decompensated systolic/diastolic heart failure Status post acute respiratory failure Hypertension History of pontine CVA COPD Obstructive  sleep apnea obesity hypoventilation syndrome History of bronchial asthma History of A. fib flutter status post TEE cardioversion in the past Her history of septic prosthetic left knee joint wrist History of psoriasis Acute on chronic anemia chronic kidney disease stage IV  Morbid obesity Hypercholesteremia Plan Continue present management per CCM Re schedule for nuclear stress test for tomorrow  LOS: 2 days    Lacee Grey N 01/23/2012, 5:42 PM

## 2012-01-23 NOTE — Progress Notes (Addendum)
ANTICOAGULATION CONSULT NOTE - Initial Consult  Pharmacy Consult for Coumadin Indication: atrial fibrillation  Allergies  Allergen Reactions  . Daptomycin Other (See Comments)    Elevated CPK  . Morphine And Related Nausea And Vomiting  . Keflex Rash    unknown  . Celecoxib     unknown  . Codeine     unknown  . Fluoxetine Hcl     unknown  . Latex     REACTION: undefined  . Ofloxacin     unknown  . Penicillins     unknown  . Rofecoxib     unknown  . Sulfonamide Derivatives     unknown    Patient Measurements: Height: 5\' 5"  (165.1 cm) Weight: 294 lb 5 oz (133.5 kg) IBW/kg (Calculated) : 57   Vital Signs: Temp: 99.2 F (37.3 C) (02/14 1200) BP: 184/70 mmHg (02/14 1200) Pulse Rate: 82  (02/14 1200)  Labs:  Basename 01/23/12 0500 01/22/12 2221 01/22/12 1446 01/22/12 0500 01/22/12 0427 01/21/12 1754 01/21/12 1633 01/21/12 1544  HGB 8.5* -- -- 8.2* -- -- -- --  HCT 28.7* -- -- 26.8* -- -- 31.0* --  PLT 230 -- -- 211 -- -- -- 357  APTT -- -- -- -- -- -- -- 39*  LABPROT 24.5* -- -- -- 22.8* 22.8* -- --  INR 2.16* -- -- -- 1.97* 1.97* -- --  HEPARINUNFRC -- -- -- -- -- -- -- --  CREATININE 2.56* -- -- 2.19* -- -- 1.50* --  CKTOTAL 56 60 80 -- -- -- -- --  CKMB 2.3 2.7 4.4* -- -- -- -- --  TROPONINI 0.73* 0.95* 1.17* -- -- -- -- --   Estimated Creatinine Clearance: 31.9 ml/min (by C-G formula based on Cr of 2.56).  Medical History: Past Medical History  Diagnosis Date  . Asthma   . Bronchitis   . Allergic rhinitis   . Diabetes mellitus   . Stroke   . HTN (hypertension)   . OSA (obstructive sleep apnea)   . DJD (degenerative joint disease)   . Kidney stones   . Depression   . Hyperlipidemia   . Complication of anesthesia     trouble waking up    Medications:  See med rec  Assessment: 61 yo F admitted with SOB and intubated in ED. Patient recently admitted in December on Coumadin for AFib/hx CVA and Vancomycin, Aztreonam, and Azithromycin empiric for  HCAP.   Medication Review:  Anticoagulation: Coumadin PTA for Afib and CVA, Called Dr Annitta Jersey office on 2/13 and he states patient prescribed 5mg  po daily. INR 2.16 today (5mg  dose given 2/13).   Spoke with Dr Sharyn Lull - will d/c warfarin, check INR tomorrow am and start heparin when INR <2.  Currently awaiting results of stress test. Infectious Disease: Vancomycin, Aztreonam, and Azithromycin (2/12 >> present) empiric for HCAP (multiple allergies), MRSA PCR NEG, BCx and Sputum Cx pending. Afebrile. PCT 3.47. WBC decreased. (in Aug, on Vanc 1g q24, trough 21 with slightly worsened renal function).  Cardiovascular: Afib/HTN/DHF. BP elevated, HR 70-80s. Troponin 2.08. ASA x1 dose, Lasix 40mg  x1 dose on 2/13, Metoprolol, Coumadin.  Endocrinology: CBGs <150. SSl.  Gastrointestinal / Nutrition: PPI IV (on at home), NPO currently. LFTs ok.  Neurology: Weaning off propofol. GCS improved.  Nephrology: SCr 2.56/estCrCl~36ml/min, Supplemented K.  Mag 1.9-stable. Pulmonary: Acute Respiratory Failure-Intubated in ED 2/12. Extubated 2/13, 95% on 2L [place].  Hematology / Oncology: Hgb stable at 8.5, platelets ok.  Best Practices: heparin for VTE ppx, PPI  for SUP  PTA Medication Issues: Not yet resumed- ASA, Allopurinol, Amiodarone, Lipitor, Albuterol, Clobetasol ointment, Digoxin, Diltiazem, Flonase, Lasix, Lantus, Levofloxacin, Spironolactone,  Ambien.   **Patient tells me she doesn't take Methotrexate anymore (?Psoriasis per Dr. Yetta Barre- however patient states has not seen Dr. Yetta Barre in years).    Goal of Therapy:  Heparin level 0.3-0.7, vanc pre-HD level 15-25   Plan:  1.  Heparin when INR >2, will check daily INR for now 2.  Continue vancomycin 1500 mg iv q24h, check trough at Css, if needed.1g IV  3.  Continue aztreonam q8h  Jill Side L. Illene Bolus, PharmD, BCPS Clinical Pharmacist Pager: [phone] 01/23/2012 1:24 PM

## 2012-01-23 NOTE — Consult Note (Addendum)
Assessment: 1)Stage 3 Chronic Kidney Disease, probably due to diabetes mellitus  2)Acute Kidney Injury, hemodynamically mediated due to treatment of CHF and blood pressure 3)CHF/Pulmonary Edema Rec: Continue to diurese and treat hypertension; avoid ACE-I & ARBs;check SPEP and ANA  History of Present Illness:  I was asked by Dr. Delton Coombes to see [name] who is a 60 y.o. 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 In ER, noted to have significant accessory muscle use, diaphoretic and hypertensive. Intubated per EDP. CXR c/w pulmonary edema. Patient diuresed. Extubated.  Pt reports history of CHF and is followed by Dr. Willey Blade. She has known CKD s cr 1.76 on 11-26-11 and 2.17 on 12-21 and 1.54 on 01-21-12.  Creatinine has risen to 2.56mg /dl today and we are asked to see for renal assistance.  Last renal US done 06-06-11 w/o major findings.   Past Medical History  Diagnosis Date  . Asthma   . Bronchitis   . Allergic rhinitis   . Diabetes mellitus   . Stroke   . HTN (hypertension)   . OSA (obstructive sleep apnea)   . DJD (degenerative joint disease)   . Kidney stones   . Depression   . Hyperlipidemia   . Complication of anesthesia     trouble waking up   Past Surgical History  Procedure Date  . Appendectomy   . Cholecystectomy   . Total knee arthroplasty   . Total abdominal hysterectomy   . Back surgery   . Cystectomy     left hand   Social History:  reports that she has quit smoking. Her smoking use included Cigarettes. She has never used smokeless tobacco. She reports that she does not drink alcohol or use illicit drugs. Allergies:  Allergies  Allergen Reactions  . Daptomycin Other (See Comments)    Elevated CPK  . Morphine And Related Nausea And  Vomiting  . Keflex Rash    unknown  . Celecoxib     unknown  . Codeine     unknown  . Fluoxetine Hcl     unknown  . Latex     REACTION: undefined  . Ofloxacin     unknown  . Penicillins     unknown  . Rofecoxib     unknown  . Sulfonamide Derivatives     unknown   Family History  Problem Relation Age of Onset  . Heart attack Father   . Asthma Father   . Heart disease Paternal Uncle   . Rectal cancer Paternal Aunt   . Other Mother     mva    Medications: I have reviewed the patient's current medications.   ROS: uses home oxygen, denies chest pain, ambulates with a walker Blood pressure 183/61, pulse 74, temperature 99.3 F (37.4 C), temperature source Oral, resp. rate 29, height 5\' 5"  (1.651 m), weight 133.5 kg (294 lb 5 oz), SpO2 96.00%.  General appearance: alert and cooperative, hoarse   Voice, difficulty speaking, raspy voice Head: Normocephalic, without obvious abnormality, atraumatic Resp: clear to auscultation bilaterally Chest wall: no tenderness Cardio: regular rate and rhythm, S1, S2 normal, no murmur, click, rub or gallop GI: soft, non-tender; bowel sounds normal; no masses,  no organomegaly and obese Extremities: chronic changes of edema Skin: Skin color, texture,  turgor normal. No rashes or lesions Neurologic: Grossly normal Results for orders placed during the hospital encounter of 01/21/12 (from the past 48 hour(s))  CBC     Status: Abnormal   Collection Time   01/21/12  3:44 PM      Component Value Range Comment   WBC 10.6 (*) 4.0 - 10.5 (K/uL)    RBC 4.26  3.87 - 5.11 (MIL/uL)    Hemoglobin 10.8 (*) 12.0 - 15.0 (g/dL)    HCT 16.1  09.6 - 04.5 (%)    MCV 88.0  78.0 - 100.0 (fL)    MCH 25.4 (*) 26.0 - 34.0 (pg)    MCHC 28.8 (*) 30.0 - 36.0 (g/dL)    RDW 40.9 (*) 81.1 - 15.5 (%)    Platelets 357  150 - 400 (K/uL)   DIFFERENTIAL     Status: Abnormal   Collection Time   01/21/12  3:44 PM      Component Value Range Comment   Neutrophils Relative  75  43 - 77 (%)    Lymphocytes Relative 17  12 - 46 (%)    Monocytes Relative 5  3 - 12 (%)    Eosinophils Relative 2  0 - 5 (%)    Basophils Relative 1  0 - 1 (%)    Neutro Abs 8.0 (*) 1.7 - 7.7 (K/uL)    Lymphs Abs 1.8  0.7 - 4.0 (K/uL)    Monocytes Absolute 0.5  0.1 - 1.0 (K/uL)    Eosinophils Absolute 0.2  0.0 - 0.7 (K/uL)    Basophils Absolute 0.1  0.0 - 0.1 (K/uL)    RBC Morphology POLYCHROMASIA PRESENT      WBC Morphology MILD LEFT SHIFT (1-5% METAS, OCC MYELO, OCC BANDS)      Smear Review        Value: PLATELET CLUMPS NOTED ON SMEAR, COUNT APPEARS ADEQUATE  COMPREHENSIVE METABOLIC PANEL     Status: Abnormal   Collection Time   01/21/12  3:44 PM      Component Value Range Comment   Sodium 138  135 - 145 (mEq/L)    Potassium 6.0 (*) 3.5 - 5.1 (mEq/L) HEMOLYSIS AT THIS LEVEL MAY AFFECT RESULT   Chloride 104  96 - 112 (mEq/L)    CO2 20  19 - 32 (mEq/L)    Glucose, Bld 320 (*) 70 - 99 (mg/dL)    BUN 22  6 - 23 (mg/dL)    Creatinine, Ser 9.14 (*) 0.50 - 1.10 (mg/dL)    Calcium 9.3  8.4 - 10.5 (mg/dL)    Total Protein 8.2  6.0 - 8.3 (g/dL)    Albumin 3.3 (*) 3.5 - 5.2 (g/dL)    AST 50 (*) 0 - 37 (U/L) HEMOLYSIS AT THIS LEVEL MAY AFFECT RESULT   ALT 15  0 - 35 (U/L) HEMOLYSIS AT THIS LEVEL MAY AFFECT RESULT   Alkaline Phosphatase 96  39 - 117 (U/L) HEMOLYSIS AT THIS LEVEL MAY AFFECT RESULT   Total Bilirubin 0.3  0.3 - 1.2 (mg/dL)    GFR calc non Af Amer 35 (*) >90 (mL/min)    GFR calc Af Amer 41 (*) >90 (mL/min)   PRO B NATRIURETIC PEPTIDE     Status: Abnormal   Collection Time   01/21/12  3:44 PM      Component Value Range Comment   Pro B Natriuretic peptide (BNP) 824.9 (*) 0 - 125 (pg/mL)   TROPONIN I     Status: Abnormal  Collection Time   01/21/12  3:44 PM      Component Value Range Comment   Troponin I 0.40 (*) <0.30 (ng/mL)   APTT     Status: Abnormal   Collection Time   01/21/12  3:44 PM      Component Value Range Comment   aPTT 39 (*) 24 - 37 (seconds)   LACTIC  ACID, PLASMA     Status: Abnormal   Collection Time   01/21/12  4:08 PM      Component Value Range Comment   Lactic Acid, Venous 4.0 (*) 0.5 - 2.2 (mmol/L)   GLUCOSE, CAPILLARY     Status: Abnormal   Collection Time   01/21/12  4:17 PM      Component Value Range Comment   Glucose-Capillary 352 (*) 70 - 99 (mg/dL)   POCT I-STAT 3, BLOOD GAS (G3+)     Status: Abnormal   Collection Time   01/21/12  4:30 PM      Component Value Range Comment   pH, Arterial 7.191 (*) 7.350 - 7.400     pCO2 arterial 53.0 (*) 35.0 - 45.0 (mmHg)    pO2, Arterial 63.0 (*) 80.0 - 100.0 (mmHg)    Bicarbonate 20.3  20.0 - 24.0 (mEq/L)    TCO2 22  0 - 100 (mmol/L)    O2 Saturation 86.0      Acid-base deficit 8.0 (*) 0.0 - 2.0 (mmol/L)    Patient temperature 98.6 F      Collection site RADIAL, ALLEN'S TEST ACCEPTABLE      Sample type ARTERIAL     POCT I-STAT, CHEM 8     Status: Abnormal   Collection Time   01/21/12  4:33 PM      Component Value Range Comment   Sodium 144  135 - 145 (mEq/L)    Potassium 3.8  3.5 - 5.1 (mEq/L)    Chloride 110  96 - 112 (mEq/L)    BUN 25 (*) 6 - 23 (mg/dL)    Creatinine, Ser 1.61 (*) 0.50 - 1.10 (mg/dL)    Glucose, Bld 096 (*) 70 - 99 (mg/dL)    Calcium, Ion 0.45 (*) 1.12 - 1.32 (mmol/L)    TCO2 23  0 - 100 (mmol/L)    Hemoglobin 10.5 (*) 12.0 - 15.0 (g/dL)    HCT 40.9 (*) 81.1 - 46.0 (%)   URINALYSIS, ROUTINE W REFLEX MICROSCOPIC     Status: Abnormal   Collection Time   01/21/12  4:36 PM      Component Value Range Comment   Color, Urine YELLOW  YELLOW     APPearance CLEAR  CLEAR     Specific Gravity, Urine 1.013  1.005 - 1.030     pH 7.0  5.0 - 8.0     Glucose, UA 500 (*) NEGATIVE (mg/dL)    Hgb urine dipstick SMALL (*) NEGATIVE     Bilirubin Urine NEGATIVE  NEGATIVE     Ketones, ur NEGATIVE  NEGATIVE (mg/dL)    Protein, ur >914 (*) NEGATIVE (mg/dL)    Urobilinogen, UA 0.2  0.0 - 1.0 (mg/dL)    Nitrite NEGATIVE  NEGATIVE     Leukocytes, UA NEGATIVE  NEGATIVE    URINE  MICROSCOPIC-ADD ON     Status: Abnormal   Collection Time   01/21/12  4:36 PM      Component Value Range Comment   Squamous Epithelial / LPF FEW (*) RARE     WBC, UA 3-6  <3 (  WBC/hpf)    RBC / HPF 3-6  <3 (RBC/hpf)    Bacteria, UA RARE  RARE    CULTURE, BLOOD (ROUTINE X 2)     Status: Normal (Preliminary result)   Collection Time   01/21/12  5:45 PM      Component Value Range Comment   Specimen Description BLOOD LEG RIGHT      Special Requests BOTTLES DRAWN AEROBIC AND ANAEROBIC 5CC      Culture  Setup Time [id number]      Culture        Value:        BLOOD CULTURE RECEIVED NO GROWTH TO DATE CULTURE WILL BE HELD FOR 5 DAYS BEFORE ISSUING A FINAL NEGATIVE REPORT   Report Status PENDING     PROTIME-INR     Status: Abnormal   Collection Time   01/21/12  5:54 PM      Component Value Range Comment   Prothrombin Time 22.8 (*) 11.6 - 15.2 (seconds)    INR 1.97 (*) 0.00 - 1.49    CULTURE, BLOOD (ROUTINE X 2)     Status: Normal (Preliminary result)   Collection Time   01/21/12  5:58 PM      Component Value Range Comment   Specimen Description BLOOD ARM RIGHT      Special Requests BOTTLES DRAWN AEROBIC ONLY 10CC      Culture  Setup Time [id number]      Culture        Value:        BLOOD CULTURE RECEIVED NO GROWTH TO DATE CULTURE WILL BE HELD FOR 5 DAYS BEFORE ISSUING A FINAL NEGATIVE REPORT   Report Status PENDING     MRSA PCR SCREENING     Status: Normal   Collection Time   01/21/12  7:09 PM      Component Value Range Comment   MRSA by PCR NEGATIVE  NEGATIVE    CARDIAC PANEL(CRET KIN+CKTOT+MB+TROPI)     Status: Abnormal   Collection Time   01/21/12  8:59 PM      Component Value Range Comment   Total CK 96  7 - 177 (U/L)    CK, MB 5.6 (*) 0.3 - 4.0 (ng/mL)    Troponin I 1.42 (*) <0.30 (ng/mL)    Relative Index RELATIVE INDEX IS INVALID  0.0 - 2.5    GLUCOSE, CAPILLARY     Status: Abnormal   Collection Time   01/21/12 10:25 PM      Component Value Range Comment    Glucose-Capillary 189 (*) 70 - 99 (mg/dL)   CARDIAC PANEL(CRET KIN+CKTOT+MB+TROPI)     Status: Abnormal   Collection Time   01/21/12 11:07 PM      Component Value Range Comment   Total CK 98  7 - 177 (U/L)    CK, MB 6.3 (*) 0.3 - 4.0 (ng/mL)    Troponin I 1.96 (*) <0.30 (ng/mL)    Relative Index RELATIVE INDEX IS INVALID  0.0 - 2.5    LACTIC ACID, PLASMA     Status: Normal   Collection Time   01/21/12 11:30 PM      Component Value Range Comment   Lactic Acid, Venous 1.7  0.5 - 2.2 (mmol/L)   PROCALCITONIN     Status: Normal   Collection Time   01/21/12 11:30 PM      Component Value Range Comment   Procalcitonin 3.47     PRO B NATRIURETIC PEPTIDE     Status: Abnormal  Collection Time   01/21/12 11:30 PM      Component Value Range Comment   Pro B Natriuretic peptide (BNP) 1741.0 (*) 0 - 125 (pg/mL)   GLUCOSE, CAPILLARY     Status: Abnormal   Collection Time   01/22/12 12:07 AM      Component Value Range Comment   Glucose-Capillary 130 (*) 70 - 99 (mg/dL)    Comment 1 Documented in Chart      Comment 2 Notify RN     PROTIME-INR     Status: Abnormal   Collection Time   01/22/12  4:27 AM      Component Value Range Comment   Prothrombin Time 22.8 (*) 11.6 - 15.2 (seconds)    INR 1.97 (*) 0.00 - 1.49    GLUCOSE, CAPILLARY     Status: Abnormal   Collection Time   01/22/12  4:30 AM      Component Value Range Comment   Glucose-Capillary 116 (*) 70 - 99 (mg/dL)   POCT I-STAT 3, BLOOD GAS (G3+)     Status: Abnormal   Collection Time   01/22/12  4:41 AM      Component Value Range Comment   pH, Arterial 7.397  7.350 - 7.400     pCO2 arterial 35.8  35.0 - 45.0 (mmHg)    pO2, Arterial 67.0 (*) 80.0 - 100.0 (mmHg)    Bicarbonate 22.2  20.0 - 24.0 (mEq/L)    TCO2 23  0 - 100 (mmol/L)    O2 Saturation 94.0      Acid-base deficit 3.0 (*) 0.0 - 2.0 (mmol/L)    Patient temperature 97.3 F      Collection site RADIAL, ALLEN'S TEST ACCEPTABLE      Drawn by RT      Sample type ARTERIAL       CBC     Status: Abnormal   Collection Time   01/22/12  5:00 AM      Component Value Range Comment   WBC 13.9 (*) 4.0 - 10.5 (K/uL)    RBC 3.10 (*) 3.87 - 5.11 (MIL/uL)    Hemoglobin 8.2 (*) 12.0 - 15.0 (g/dL) DELTA CHECK NOTED   HCT 26.8 (*) 36.0 - 46.0 (%)    MCV 86.5  78.0 - 100.0 (fL)    MCH 26.5  26.0 - 34.0 (pg)    MCHC 30.6  30.0 - 36.0 (g/dL)    RDW 19.1 (*) 47.8 - 15.5 (%)    Platelets 211  150 - 400 (K/uL) DELTA CHECK NOTED  BASIC METABOLIC PANEL     Status: Abnormal   Collection Time   01/22/12  5:00 AM      Component Value Range Comment   Sodium 142  135 - 145 (mEq/L)    Potassium 4.0  3.5 - 5.1 (mEq/L)    Chloride 109  96 - 112 (mEq/L)    CO2 23  19 - 32 (mEq/L)    Glucose, Bld 119 (*) 70 - 99 (mg/dL)    BUN 29 (*) 6 - 23 (mg/dL)    Creatinine, Ser 2.95 (*) 0.50 - 1.10 (mg/dL)    Calcium 8.2 (*) 8.4 - 10.5 (mg/dL)    GFR calc non Af Amer 23 (*) >90 (mL/min)    GFR calc Af Amer 27 (*) >90 (mL/min)   MAGNESIUM     Status: Normal   Collection Time   01/22/12  5:00 AM      Component Value Range Comment   Magnesium 1.9  1.5 - 2.5 (mg/dL)   PHOSPHORUS     Status: Normal   Collection Time   01/22/12  5:00 AM      Component Value Range Comment   Phosphorus 3.9  2.3 - 4.6 (mg/dL)   CARDIAC PANEL(CRET KIN+CKTOT+MB+TROPI)     Status: Abnormal   Collection Time   01/22/12  6:00 AM      Component Value Range Comment   Total CK 94  7 - 177 (U/L)    CK, MB 6.4 (*) 0.3 - 4.0 (ng/mL) CRITICAL VALUE NOTED.  VALUE IS CONSISTENT WITH PREVIOUSLY REPORTED AND CALLED VALUE.   Troponin I 2.08 (*) <0.30 (ng/mL)    Relative Index RELATIVE INDEX IS INVALID  0.0 - 2.5    GLUCOSE, CAPILLARY     Status: Abnormal   Collection Time   01/22/12  8:04 AM      Component Value Range Comment   Glucose-Capillary 113 (*) 70 - 99 (mg/dL)   GLUCOSE, CAPILLARY     Status: Abnormal   Collection Time   01/22/12 11:39 AM      Component Value Range Comment   Glucose-Capillary 133 (*) 70 - 99 (mg/dL)    CARDIAC PANEL(CRET KIN+CKTOT+MB+TROPI)     Status: Abnormal   Collection Time   01/22/12  2:46 PM      Component Value Range Comment   Total CK 80  7 - 177 (U/L)    CK, MB 4.4 (*) 0.3 - 4.0 (ng/mL)    Troponin I 1.17 (*) <0.30 (ng/mL)    Relative Index RELATIVE INDEX IS INVALID  0.0 - 2.5    GLUCOSE, CAPILLARY     Status: Abnormal   Collection Time   01/22/12  4:11 PM      Component Value Range Comment   Glucose-Capillary 105 (*) 70 - 99 (mg/dL)   GLUCOSE, CAPILLARY     Status: Abnormal   Collection Time   01/22/12  8:12 PM      Component Value Range Comment   Glucose-Capillary 125 (*) 70 - 99 (mg/dL)   CARDIAC PANEL(CRET KIN+CKTOT+MB+TROPI)     Status: Abnormal   Collection Time   01/22/12 10:21 PM      Component Value Range Comment   Total CK 60  7 - 177 (U/L)    CK, MB 2.7  0.3 - 4.0 (ng/mL)    Troponin I 0.95 (*) <0.30 (ng/mL)    Relative Index RELATIVE INDEX IS INVALID  0.0 - 2.5    GLUCOSE, CAPILLARY     Status: Abnormal   Collection Time   01/23/12 12:19 AM      Component Value Range Comment   Glucose-Capillary 142 (*) 70 - 99 (mg/dL)    Comment 1 Documented in Chart      Comment 2 Notify RN     GLUCOSE, CAPILLARY     Status: Abnormal   Collection Time   01/23/12  4:16 AM      Component Value Range Comment   Glucose-Capillary 132 (*) 70 - 99 (mg/dL)    Comment 1 Documented in Chart      Comment 2 Notify RN     PROTIME-INR     Status: Abnormal   Collection Time   01/23/12  5:00 AM      Component Value Range Comment   Prothrombin Time 24.5 (*) 11.6 - 15.2 (seconds)    INR 2.16 (*) 0.00 - 1.49    CBC     Status: Abnormal   Collection Time  01/23/12  5:00 AM      Component Value Range Comment   WBC 10.0  4.0 - 10.5 (K/uL)    RBC 3.31 (*) 3.87 - 5.11 (MIL/uL)    Hemoglobin 8.5 (*) 12.0 - 15.0 (g/dL)    HCT 96.0 (*) 45.4 - 46.0 (%)    MCV 86.7  78.0 - 100.0 (fL)    MCH 25.7 (*) 26.0 - 34.0 (pg)    MCHC 29.6 (*) 30.0 - 36.0 (g/dL)    RDW 09.8 (*) 11.9 - 15.5 (%)     Platelets 230  150 - 400 (K/uL)   BASIC METABOLIC PANEL     Status: Abnormal   Collection Time   01/23/12  5:00 AM      Component Value Range Comment   Sodium 143  135 - 145 (mEq/L)    Potassium 4.2  3.5 - 5.1 (mEq/L)    Chloride 109  96 - 112 (mEq/L)    CO2 26  19 - 32 (mEq/L)    Glucose, Bld 146 (*) 70 - 99 (mg/dL)    BUN 30 (*) 6 - 23 (mg/dL)    Creatinine, Ser 1.47 (*) 0.50 - 1.10 (mg/dL)    Calcium 8.6  8.4 - 10.5 (mg/dL)    GFR calc non Af Amer 19 (*) >90 (mL/min)    GFR calc Af Amer 22 (*) >90 (mL/min)   MAGNESIUM     Status: Normal   Collection Time   01/23/12  5:00 AM      Component Value Range Comment   Magnesium 1.9  1.5 - 2.5 (mg/dL)   PHOSPHORUS     Status: Normal   Collection Time   01/23/12  5:00 AM      Component Value Range Comment   Phosphorus 4.3  2.3 - 4.6 (mg/dL)   CARDIAC PANEL(CRET KIN+CKTOT+MB+TROPI)     Status: Abnormal   Collection Time   01/23/12  5:00 AM      Component Value Range Comment   Total CK 56  7 - 177 (U/L)    CK, MB 2.3  0.3 - 4.0 (ng/mL)    Troponin I 0.73 (*) <0.30 (ng/mL)    Relative Index RELATIVE INDEX IS INVALID  0.0 - 2.5    GLUCOSE, CAPILLARY     Status: Abnormal   Collection Time   01/23/12  8:09 AM      Component Value Range Comment   Glucose-Capillary 125 (*) 70 - 99 (mg/dL)   STREP PNEUMONIAE URINARY ANTIGEN     Status: Normal   Collection Time   01/23/12  9:41 AM      Component Value Range Comment   Strep Pneumo Urinary Antigen NEGATIVE  NEGATIVE    SODIUM, URINE, RANDOM     Status: Normal   Collection Time   01/23/12  9:41 AM      Component Value Range Comment   Sodium, Ur 110     CREATININE, URINE, RANDOM     Status: Normal   Collection Time   01/23/12  9:41 AM      Component Value Range Comment   Creatinine, Urine 66.16      Portable Chest Xray In Am  01/22/2012  *RADIOLOGY REPORT*  Clinical Data: Evaluate endotracheal tube positioning and pulmonary edema  PORTABLE CHEST - 1 VIEW  Comparison: 01/21/2012;  01/25/2012; 03/25/2011  Findings: Grossly unchanged enlarged cardiac silhouette and mediastinal contours.  Endotracheal tube overlies tracheal air column, tip superior to the carina.  Interval placement of enteric  tube with tip terminating inferior to the left hemidiaphragm.  No significant interval change and bilateral diffuse consolidative opacities.  No definite pneumothorax or pleural effusions.  Grossly unchanged bones.  IMPRESSION: 1.  Endotracheal tube overlies tracheal air column with tip superior to the carina.  No pneumothorax. 2.  Interval placement of enteric tube with tip inferior to the left hemidiaphragm. 3.  No significant interval change in diffuse bilateral consolidative opacities. Differential considerations remain broad, including alveolar pulmonary edema, multifocal infection or pulmonary hemorrhage.  Original Report Authenticated By: Waynard Reeds, M.D.   Dg Chest Port 1 View  01/21/2012  *RADIOLOGY REPORT*  Clinical Data: Shortness of breath.  Endotracheal tube placement  PORTABLE CHEST - 1 VIEW  Comparison: Chest x-ray 11/24/2011.  Findings: Endotracheal tube has been placed with tip at the level of the thoracic inlet.  The lung volumes are low.  There is widespread airspace consolidation throughout all regions of the lungs bilaterally.  This completely obscures the underlying pulmonary vascularity.  Moderate enlargement of the cardiopericardial silhouette (slightly increased compared to prior).  Widening of mediastinal contours, likely accentuated by low lung volumes and slight patient rotation to the right.  IMPRESSION:  1.  Endotracheal tube appears properly located. 2.  Severe widespread air space consolidation throughout all aspects of the lungs bilaterally.  This may reflect pulmonary edema and/or multilobar pneumonia. 3.  Moderate enlargement of the cardiopericardial silhouette which may reflect underlying cardiomegaly and/or the presence of a pericardial effusion.  Original  Report Authenticated By: Florencia Reasons, M.D.   Dg Pneumonia Chest Port1v  01/23/2012  *RADIOLOGY REPORT*  Clinical Data: 61 year old female with shortness of breath.  PORTABLE CHEST - 1 VIEW  Comparison: 01/22/2012 and earlier.  Findings: Portable semi upright AP view 0558 hours.  The patient is extubated and enteric tube removed.  Interval increased nodular and confluent bilateral pulmonary opacity, maximal in the right upper lung.  Lung volumes appear slightly lower. Stable cardiomegaly and mediastinal contours.  No pneumothorax.  No large effusion.  IMPRESSION: 1.  Extubated and enteric tube removed. 2.  Increased bilateral nodular and confluent opacity. Differential considerations include acute/worsening pulmonary edema, less likely increased infection.  Original Report Authenticated By: Harley Hallmark, M.D.   Dg Pneumonia Chest Port1v  01/22/2012  *RADIOLOGY REPORT*  Clinical Data: Evaluate infiltrates versus effusions  PORTABLE CHEST - 1 VIEW  Comparison: Earlier same day; 01/21/2012  Findings:  Grossly unchanged enlarged cardiac silhouette and mediastinal contours.  Stable position of support apparatus.  Overall marked improved aeration of the bilateral lungs with persistent mid and lower lung heterogeneous air space opacities.  Pulmonary vasculature remains indistinct.  No definite pleural effusion, though note, examination is degraded secondary to exclusion of the bilateral costophrenic angles.  No definite pneumothorax.  Grossly unchanged bones.  IMPRESSION: 1.  Stable positioning of support apparatus.  No pneumothorax. 2.  Overall improved aeration of the lungs suggestive of improving pulmonary edema. 3.  Residual bibasilar airspace opacities favored to represent atelectasis and residual edema.  Original Report Authenticated By: Waynard Reeds, M.D.   Sebert Stollings C 01/23/2012, 11:44 AM

## 2012-01-23 NOTE — Progress Notes (Signed)
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 For psoriasis) presented to [facility] ED via EMS on 2/12 with increasing shortness of breath not responsive to O2 & nebs at home. In ER, noted to have significant accessory muscle use, diaphoretic and hypertensive. Intubated per EDP. PCCM consulted for ICU admit.  Lines / Drains:  2/12 OETT>>>  2/12 R femoral>>> 2/12  Foley>>>  Cultures:  2/12 BCx2>>>pending 2/12 Resp culture>>>pending  2/12 UA>>>500 Glc, >300 protein, neg leuks/nit, 3-6 WBC, rare bacteria, 1.013 2/12 U. Strep>>>needs to be collected 2/12 MRSA PCR>>neg  Antibiotics:  Vanco 2/12 (?HCAP)>>>  Azithro 2/12 (?HCAP)>>>  Azactam (?HCAP)>>>   Tests / Events:  2/12  Intubated in ED  2/12  CXR: widespread air space consolidation throughout; may reflect pulmonary edema and/or multilobar PNA. Moderate enlargement cardiac silhouette; ?cardiomegaly and/or pericardial effusion 2/13 extubated 2/13  2D-ECHO: 50-55%, grade 1 diastolic, no wall motion abnormalities  Consults: None     Subjective:    Overnight Issues:  None. Complaining of SOB this morning starting from last night. Not unusual for her she says. Usually SOB even at home.   Drips: 2/12 propofol>>2/13  Objective:  Vital signs for last 24 hours: Temp:  [97.8 F (36.6 C)-100.3 F (37.9 C)] 98.9 F (37.2 C) (02/14 0600) Pulse Rate:  [58-85] 72  (02/14 0600) Resp:  [7-32] 17  (02/14 0600) BP: (134-178)/(26-75) 172/64 mmHg (02/14 0600) SpO2:  [95 %-100 %] 98 % (02/14 0600) FiO2 (%):  [40 %] 40 % (02/13 0900)  Hemodynamic parameters for last 24 hours:    Intake/Output from previous day: 02/13 0701 - 02/14 0700 In: 1410.3 [I.V.:410.3; IV Piggyback:1000] Out: 2950 [Urine:2950]  Intake/Output this shift:    Vent settings  for last 24 hours: Vent Mode:  [-] CPAP FiO2 (%):  [40 %] 40 % Pressure Support:  [5 cmH20] 5 cmH20  Physical Examination:  General: chronically ill, morbidly obese, in NAD Neuro: appropriate to questions, alert CV: s1s2, rrr, no m/r/g, JVD noted  PULM: supraclavicular retractions, fair aeration, no ronchi or rales but occasional wheeze/coarse breath sound GI: obese/soft, non-tender Extremities: warm/dry, 2+ BLE edema, chronic lower extremity changes  Labs:  Lab 01/23/12 0500 01/22/12 0500 01/21/12 1633 01/21/12 1544  NA 143 142 144 138  K 4.2 4.0 -- --  CL 109 109 110 104  CO2 26 23 -- 20  GLUCOSE 146* 119* 360* 320*  BUN 30* 29* 25* 22  CREATININE 2.56* 2.19* 1.50* 1.54*  CALCIUM 8.6 8.2* -- 9.3  MG 1.9 1.9 -- --  PHOS 4.3 3.9 -- --    Lab 01/23/12 0500 01/22/12 0500 01/21/12 1633 01/21/12 1544  HGB 8.5* 8.2* 10.5* --  HCT 28.7* 26.8* 31.0* --  WBC 10.0 13.9* -- 10.6*  PLT 230 211 -- 357    Lab 01/21/12 2330  PROCALCITON 3.47     Lab 01/22/12 0441 01/21/12 1633 01/21/12 1630  PHART 7.397 -- 7.191*  PCO2ART 35.8 -- 53.0*  PO2ART 67.0* -- 63.0*  HCO3 22.2 -- 20.3  TCO2 23 23 22   O2SAT 94.0 -- 86.0    Assessment/Plan:  61 YO AAF with a history of multiple medical problems (including history asthma/OSA, AF on warfarin/HTN/systolic and diastolic heart failure, insulin-dependent diabetes, chronic immunosuppression; past history of CVA) presenting with acute shortness-of-breath and requiring intubation  in the ED.  Acute Respiratory Failure:  ?HCAP (recent hospitalization Dec 2012) Pulmonary edema on admission in setting of hypertensive emergency. History of asthma & OSA/OHS on home O2/CPAP --> Extubated 2/13. Chest sounds tight with occasional wheeze. Last received Duoneb at 01:45; due for another at 08:00. Checking ABG.  --> F/u cxr  --> Vanc, azithromycin/aztreonam. Possible multi-lobar infiltrates on admission CXR. Afebrile and WBC normal on admission, however,  elevated mildly 02/13. Now WBC but low-grade fevers (Tmax 100.4) overnight.  Will continue abx specially that patient is immune compromise. --> Continue antibiotics and f/u blood and respiratory cultures. Pending. Specially given the fact that patient is on methotrexate.  Lab 01/23/12 0500 01/22/12 0500 01/21/12 1544  WBC 10.0 13.9* 10.6*  --> Combivent scheduled. History of tobacco use, now quit.  CV:  Hypertensive emergency on admission; history of hypertension Small non-Q-wave MI with elevated troponin. ECG showed sinus bradycardia without ST/T-wave abnormalities.   Lab 01/23/12 0500 01/22/12 2221 01/22/12 1446 01/22/12 0600 01/21/12 2307  TROPONINI 0.73* 0.95* 1.17* 2.08* 1.96*  Sinus bradycardia overnight 2/12-2/13. Resolved.  History of systolic and diastolic CHF (ECHO 05/2010 EF 16-10%-->9/60: 50-55%, grade 1 diastolic, no wall motion abnormalities) Atrial fibrillation on warfarin; history of AF with RVR  Lab 01/23/12 0500 01/22/12 0427 01/21/12 1754  INR 2.16* 1.97* 1.97*  --> Blood pressures 140-170s/50-70s today. --> Hydralazine prn. On amiodarone (received 1 dose), received Lasix 40 IV x1 yesterday, metoprolol 12.5 bid. --> Cardiology consulted 2/13. Seen by Dr. Sharyn Lull. Nuclear stress test today. Troponins trending down.    Lab 01/23/12 0500 01/22/12 2221 01/22/12 1446 01/22/12 0600 01/21/12 2307  TROPONINI 0.73* 0.95* 1.17* 2.08* 1.96*  --> Holding home digoxin, Lasix 40 bid, metolazone 2.5, spirono 25, metoprolol 50 bid. --> Follow-up 2D-ECHO.  --> Warfarin per pharmacy. INR therapeutic today but Cardiology recommending holding due to stress test.   Lab 01/23/12 0500 01/22/12 0427 01/21/12 1754  INR 2.16* 1.97* 1.97*   ENDO:  DM   Lab 01/23/12 0416 01/23/12 0019 01/22/12 2012 01/22/12 1611 01/22/12 1139  GLUCAP 132* 142* 125* 105* 133*  -->  ICU hyperglycemic protocol.   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  Will call  renal today for consultation.  Lab 01/23/12 0500 01/22/12 0500 01/21/12 1633 01/21/12 1544  CREATININE 2.56* 2.19* 1.50* 1.54*  Acute renal failure 2/14 -->  Elevated today. 2/2 hypertensive emergency, ?Lasix. Never hypotensive. Checking SPEP, UPEP. Good urine output.  RHEUM: Chronic immunosuppression (on home methotrexate for psoriasis) History of psoriasis History of gout -->  Holding methotrexate  -->  Holding allopurinol  Best practices / Disposition  -->Code Status: Full code  -->DVT Px: coumadin (afib)  -->GI Px: protonix  -->Diet: NPO>>>Carbohydrate-modified following nuclear stress test.  -->Disposition: if ABG okay, transfer to step-down.    LOS: 2 days   OH PARK, ANGELA 01/23/2012  CPAP while asleep, low dose diureses today, renal U/S pending, renal consult called, PT/OT, flutter valve.  Will hold on PCCM service and in the ICU overnight given extent of SOB.  Patient seen and examined, agree with above note.  I dictated the care and orders written for this patient under my direction.  Koren Bound, M.D. [phone]

## 2012-01-24 ENCOUNTER — Inpatient Hospital Stay (HOSPITAL_COMMUNITY): Payer: Medicare HMO

## 2012-01-24 ENCOUNTER — Other Ambulatory Visit (HOSPITAL_COMMUNITY): Payer: Medicare HMO

## 2012-01-24 LAB — URINE MICROSCOPIC-ADD ON

## 2012-01-24 LAB — GLUCOSE, CAPILLARY
Glucose-Capillary: 136 mg/dL — ABNORMAL HIGH (ref 70–99)
Glucose-Capillary: 140 mg/dL — ABNORMAL HIGH (ref 70–99)
Glucose-Capillary: 139 mg/dL — ABNORMAL HIGH (ref 70–99)
Glucose-Capillary: 154 mg/dL — ABNORMAL HIGH (ref 70–99)

## 2012-01-24 LAB — URINALYSIS, ROUTINE W REFLEX MICROSCOPIC
Bilirubin Urine: NEGATIVE
Ketones, ur: NEGATIVE mg/dL
Nitrite: NEGATIVE
Protein, ur: >300 mg/dL — AB
Specific Gravity, Urine: 1.017 (ref 1.005–1.030)
Urobilinogen, UA: 0.2 mg/dL (ref 0.0–1.0)

## 2012-01-24 LAB — PHOSPHORUS: Phosphorus: 3.5 mg/dL (ref 2.3–4.6)

## 2012-01-24 LAB — BASIC METABOLIC PANEL
CO2: 27 mEq/L (ref 19–32)
Chloride: 106 mEq/L (ref 96–112)
Creatinine, Ser: 2.39 mg/dL — ABNORMAL HIGH (ref 0.50–1.10)
Potassium: 3.8 mEq/L (ref 3.5–5.1)

## 2012-01-24 LAB — CBC
HCT: 28.1 % — ABNORMAL LOW (ref 36.0–46.0)
Hemoglobin: 8.4 g/dL — ABNORMAL LOW (ref 12.0–15.0)
MCV: 85.7 fL (ref 78.0–100.0)
RBC: 3.28 MIL/uL — ABNORMAL LOW (ref 3.87–5.11)
RDW: 20.7 % — ABNORMAL HIGH (ref 11.5–15.5)
WBC: 10.3 10*3/uL (ref 4.0–10.5)

## 2012-01-24 LAB — PROTIME-INR: INR: 2.1 — ABNORMAL HIGH (ref 0.00–1.49)

## 2012-01-24 LAB — TRIGLYCERIDES: Triglycerides: 107 mg/dL (ref <150)

## 2012-01-24 MED ORDER — PANTOPRAZOLE SODIUM 40 MG PO TBEC
40.0000 mg | DELAYED_RELEASE_TABLET | Freq: Every day | ORAL | Status: DC
  Administered 2012-01-24 – 2012-02-05 (×13): 40 mg via ORAL
  Filled 2012-01-24 (×11): qty 1

## 2012-01-24 MED ORDER — FUROSEMIDE 80 MG PO TABS
80.0000 mg | ORAL_TABLET | Freq: Every day | ORAL | Status: AC
  Administered 2012-01-24: 80 mg via ORAL
  Filled 2012-01-24: qty 1

## 2012-01-24 MED ORDER — FUROSEMIDE 10 MG/ML IJ SOLN
40.0000 mg | Freq: Four times a day (QID) | INTRAMUSCULAR | Status: DC
  Administered 2012-01-24: 40 mg via INTRAVENOUS
  Filled 2012-01-24: qty 4

## 2012-01-24 MED ORDER — ASPIRIN 81 MG PO CHEW
81.0000 mg | CHEWABLE_TABLET | Freq: Every day | ORAL | Status: DC
  Administered 2012-01-24 – 2012-02-05 (×13): 81 mg via ORAL
  Filled 2012-01-24 (×14): qty 1

## 2012-01-24 MED ORDER — FUROSEMIDE 10 MG/ML IJ SOLN
INTRAMUSCULAR | Status: AC
  Administered 2012-01-24: 40 mg via INTRAVENOUS
  Filled 2012-01-24: qty 4

## 2012-01-24 MED ORDER — WHITE PETROLATUM GEL
Status: AC
  Administered 2012-01-24: 11:00:00
  Filled 2012-01-24: qty 5

## 2012-01-24 MED ORDER — HYDRALAZINE HCL 20 MG/ML IJ SOLN
10.0000 mg | INTRAMUSCULAR | Status: DC | PRN
  Administered 2012-01-24 (×3): 10 mg via INTRAVENOUS
  Filled 2012-01-24 (×2): qty 0.5

## 2012-01-24 MED ORDER — SPIRONOLACTONE 25 MG PO TABS
25.0000 mg | ORAL_TABLET | Freq: Every day | ORAL | Status: DC
  Administered 2012-01-24 – 2012-02-05 (×13): 25 mg via ORAL
  Filled 2012-01-24 (×13): qty 1

## 2012-01-24 MED ORDER — SODIUM CHLORIDE 0.9 % IJ SOLN
INTRAMUSCULAR | Status: AC
  Administered 2012-01-24: 20 mL
  Filled 2012-01-24: qty 20

## 2012-01-24 NOTE — Progress Notes (Signed)
Patient Nancy L. 20, 61 year old [place] American female suffers with multiple health issues; but enjoys the emotional support of her husband, 2 children, grandchildren, and great grand children.  Patient and her husband are grounded in a good relationship with their local church.  Patient and her husband expressed appreciation for Chaplain's provision of pastoral presence, prayer, and conversation.  For follow-up, I defer to the unit Chaplain.

## 2012-01-24 NOTE — Progress Notes (Addendum)
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 For psoriasis) presented to [facility] ED via EMS on 2/12 with increasing shortness of breath not responsive to O2 & nebs at home. In ER, noted to have significant accessory muscle use, diaphoretic and hypertensive. Intubated per EDP. PCCM consulted for ICU admit.  Lines / Drains:  2/12 OETT>>>2/13  2/12 R femoral>>> 2/12  Foley>>> 2/14 PIV x2>>>  Cultures:  2/12 BCx2>>>NGTD 2/12 Resp culture>>>NGTD 2/12 UA>>>500 Glc, >300 protein, neg leuks/nit, 3-6 WBC, rare bacteria, 1.013 2/12 U. Strep>>>pending 2/12 MRSA PCR>>neg  Antibiotics:  Vanco 2/12 (?HCAP)>>>  Azithro 2/12 (?HCAP)>>>  Azactam (?HCAP)>>>   Tests / Events:  2/12  Intubated in ED  2/12  CXR: widespread air space consolidation throughout; may reflect pulmonary edema and/or multilobar PNA. Moderate enlargement cardiac silhouette; ?cardiomegaly and/or pericardial effusion 2/13 extubated 2/13  2D-ECHO: 50-55%, grade 1 diastolic, no wall motion abnormalities  Consults: Renal 2/14>>> Cardiology (Dr. Sharyn Lull) 2/12>>> Treatment Team:  Lauris Poag, MD   Subjective:    Overnight Issues:  Elevated blood pressures overnight 180-200s systolics. Coreg increased from 25 to 50 bid.   Drips: 2/12 propofol>>2/13  Objective:  Vital signs for last 24 hours: Temp:  [98.9 F (37.2 C)-100.8 F (38.2 C)] 99.6 F (37.6 C) (02/15 0626) Pulse Rate:  [74-101] 82  (02/15 0626) Resp:  [15-37] 31  (02/15 0626) BP: (144-207)/(40-82) 187/60 mmHg (02/15 0617) SpO2:  [88 %-98 %] 88 % (02/15 0626) FiO2 (%):  [50 %] 50 % (02/15 0626)  Hemodynamic parameters for last 24 hours:    Intake/Output from previous day: 02/14 0701 - 02/15 0700 In: 1250 [I.V.:340; IV Piggyback:910] Out: 5330  [Urine:5330]  Intake/Output this shift:    Vent settings for last 24 hours: Vent Mode:  [-]  FiO2 (%):  [50 %] 50 %  Physical Examination:  General: chronically ill, morbidly obese, in NAD Neuro: appropriate to questions, alert CV: s1s2, rrr, no m/r/g, JVD noted  PULM: supraclavicular retractions but less than yesterday (may be baseline), fair aeration, no ronchi or rales; no wheezes (improved from yesterday); wearing VM GI: obese/soft, non-tender Extremities: warm/dry, 2+ BLE edema, chronic lower extremity changes  Labs:  Lab 01/24/12 0450 01/23/12 0500 01/22/12 0500 01/21/12 1633 01/21/12 1544  NA 142 143 142 144 138  K 3.8 4.2 -- -- --  CL 106 109 109 110 104  CO2 27 26 23  -- 20  GLUCOSE 133* 146* 119* 360* 320*  BUN 30* 30* 29* 25* 22  CREATININE 2.39* 2.56* 2.19* 1.50* 1.54*  CALCIUM 8.9 8.6 8.2* -- 9.3  MG 1.9 1.9 1.9 -- --  PHOS 3.5 4.3 3.9 -- --    Lab 01/24/12 0450 01/23/12 0500 01/22/12 0500  HGB 8.4* 8.5* 8.2*  HCT 28.1* 28.7* 26.8*  WBC 10.3 10.0 13.9*  PLT 272 230 211    Lab 01/21/12 2330  PROCALCITON 3.47     Lab 01/23/12 1106 01/22/12 0441 01/21/12 1633 01/21/12 1630  PHART 7.421* 7.397 -- 7.191*  PCO2ART 37.4 35.8 -- 53.0*  PO2ART 59.0* 67.0* -- 63.0*  HCO3 24.4* 22.2 -- 20.3  TCO2 25 23 23 22   O2SAT 91.0 94.0 -- 86.0    Assessment/Plan:  60 YO AAF with a history of multiple medical problems (including history asthma/OSA, AF on warfarin/HTN/systolic and diastolic  heart failure, insulin-dependent diabetes, chronic immunosuppression; past history of CVA) presenting with acute shortness-of-breath and requiring intubation in the ED.  Acute Respiratory Failure:  ?HCAP (recent hospitalization Dec 2012) Pulmonary edema on admission in setting of hypertensive emergency. History of asthma & OSA/OHS on home O2/CPAP --> Extubated 2/13. Still with some increased work of breathing but may be baseline as ABG looks okay. Decent saturations in 90s,  occasional upper 80s. CPAP overnight. VM/[place] during day. Uses 2.5 L  during night at home.   Lab 01/23/12 1106 01/22/12 0441 01/21/12 1633 01/21/12 1630  PHART 7.421* 7.397 -- 7.191*  PCO2ART 37.4 35.8 -- 53.0*  PO2ART 59.0* 67.0* -- 63.0*  HCO3 24.4* 22.2 -- 20.3  TCO2 25 23 23 22   O2SAT 91.0 94.0 -- 86.0  --> Improved effusions on CXR today after Lasix (on Lasix 40 bid at home) --> Vanc, azithromycin/aztreonam. Possible multi-lobar infiltrates on admission CXR. Afebrile and WBC normal on admission, however, elevated mildly 02/13.  2/14 WBC normalized but low-grade fevers (Tmax 100.4) overnight.   Continues to have fevers.   Lab 01/24/12 0450 01/23/12 0500 01/22/12 0500 01/21/12 1544  WBC 10.3 10.0 13.9* 10.6*  Will continue abx especially that patient is immune compromise. Likely will need to complete 8-day course of IV antibiotics for HCAP. Day 4 today.  --> Continue antibiotics and f/u blood and respiratory cultures. Pending. Specially given the fact that patient is on methotrexate. Consider discontinuing tomorrow if patient stable.  --> Combivent scheduled q6. History of tobacco use, now quit.  ID: ?HCAP Urinalysis on admission neg leuks/nit, few WBC Starting 2/14 with low-grade fevers. -->Continue Tx for HCAP. See above. -->Re-check urinalysis  CV:  Hypertensive emergency on admission; history of hypertension Small non-Q-wave MI with elevated troponin. ECG showed sinus bradycardia without ST/T-wave abnormalities.   Lab 01/23/12 0500 01/22/12 2221 01/22/12 1446 01/22/12 0600 01/21/12 2307  TROPONINI 0.73* 0.95* 1.17* 2.08* 1.96*  History of systolic and diastolic CHF (ECHO 05/2010 EF 09-81%-->1/91: 50-55%, grade 1 diastolic, no wall motion abnormalities) Atrial fibrillation on warfarin; history of AF with RVR  Lab 01/24/12 0450 01/23/12 0500 01/22/12 0427 01/21/12 1754  INR 2.10* 2.16* 1.97* 1.97*  --> Blood pressures remain elevated despite increasing metoprolol yesterday.  Now systolics 160-180s. On amiodarone 200, received Lasix 80 IV yesterday, Coreg 50 bid. Pleural effusions improved but still has some; good diuresis yesterday>>>continue amiodarone, Lasix 40 IV x 2, metoprolol 50 bid. Consider adding home spironolactone.  --> Holding home digoxin, Lasix 40 bid, metolazone 2.5, spirono 25, metoprolol 50 bid. --> Cardiology consulted 2/13. Seen by Dr. Sharyn Lull. Nuclear stress test today.  --> Warfarin per pharmacy. INR therapeutic today.  Lab 01/24/12 0450 01/23/12 0500 01/22/12 0427 01/21/12 1754  INR 2.10* 2.16* 1.97* 1.97*   ENDO:  DM   Lab 01/24/12 0401 01/24/12 0034 01/23/12 2011 01/23/12 1615 01/23/12 1244  GLUCAP 140* 136* 151* 154* 137*  -->  ICU hyperglycemic protocol.   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  Will call renal today for consultation.  Lab 01/24/12 0450 01/23/12 0500 01/22/12 0500 01/21/12 1633 01/21/12 1544  CREATININE 2.39* 2.56* 2.19* 1.50* 1.54*  Acute renal failure 2/14 -->  2/2 hypertensive emergency. Cr stable today. Checking SPEP, UPEP. Good urine output.  HEME/ONC: Anemia  Lab 01/24/12 0450 01/23/12 0500 01/22/12 0500 01/21/12 1633 01/21/12 1544  HGB 8.4* 8.5* 8.2* 10.5* 10.8*  -Stable. Monitor.   RHEUM: Chronic immunosuppression (on home methotrexate for psoriasis) History of psoriasis History of  gout -->  Holding methotrexate  -->  Holding allopurinol  Best practices / Disposition  -->Code Status: Full code  -->DVT Px: coumadin (afib)  -->GI Px: protonix  -->Diet: NPO>>>Carbohydrate-modified following nuclear stress test.  -->Disposition: consider transferring to stepdown now that respiratory status is more stable.      LOS: 3 days   OH PARK, ANGELA 01/24/2012  Reviewed above, examined pt and agree with assessment/plan.  Improved.  Will transition to SDU.  Will transfer service to Dr. [name] for 2/16 and PCCM will sign off.  Coralyn Helling, MD 01/24/2012, 11:49  AM Pager:  [phone]

## 2012-01-24 NOTE — Progress Notes (Signed)
Assessment:  1)Stage 3 Chronic Kidney Disease, probably due to diabetes mellitus  2)Acute Kidney Injury, hemodynamically mediated due to treatment of CHF and blood pressure  3)CHF/Pulmonary Edema--diuresing, suggest decrease furosemide  Rec: Continue to diurese and treat hypertension; avoid ACE-I & ARBs;check SPEP and ANA pending  S:Feling better, breathing better O:BP 164/50  Pulse 88  Temp(Src) 99.4 F (37.4 C) (Core (Comment))  Resp 26  Ht 5\' 5"  (1.651 m)  Wt 133.5 kg (294 lb 5 oz)  BMI 48.98 kg/m2  SpO2 99%  Intake/Output Summary (Last 24 hours) at 01/24/12 0813 Last data filed at 01/24/12 0700  Gross per 24 hour  Intake   1250 ml  Output   5595 ml  Net  -4345 ml   Weight change:  ZHY:QMVHQ female CVS:RRR Resp:clear anteriorly ION:GEXB, obese Ext:2+ chronic changes    . albuterol-ipratropium  6 puff Inhalation Q6H  . amiodarone  200 mg Oral Daily  . azithromycin  500 mg Intravenous Q24H  . aztreonam  1 g Intravenous Q8H  . furosemide      . furosemide      . furosemide  40 mg Intravenous Q8H  . furosemide  40 mg Intravenous Q6H  . insulin aspart  0-4 Units Subcutaneous Q4H  . metoprolol tartrate  50 mg Oral BID  . pantoprazole (PROTONIX) IV  40 mg Intravenous QHS  . spironolactone  25 mg Oral Daily  . vancomycin  1,500 mg Intravenous Q24H  . DISCONTD: metoprolol tartrate  12.5 mg Oral BID  . DISCONTD: midazolam  4 mg Intravenous Once  . DISCONTD: vancomycin  2,000 mg Intravenous Once   US Renal Port  01/23/2012  *RADIOLOGY REPORT*  Clinical Data: 61 year old female with acute on chronic kidney disease.  ULTRASOUND PORTABLE RENAL  Technique:  Complete ultrasound examination of the urinary tract was performed including evaluation of the kidneys, renal collecting systems, and urinary bladder.  Comparison: CT abdomen pelvis 11/23/2011.  Findings:  Right Kidney:  No hydronephrosis.  Renal length 12.4 cm.  Stable atrophy/cortical thinning.  Increased cortical  echotexture.  Stable small simple appearing cysts up to 18 mm in diameter.  Left kidney: Suboptimal detail due to body habitus.  No hydronephrosis.  Renal length 12.9 cm.  Cortical thickness and echotexture better preserved and similar to the prior CT.  Bladder:  Not visualized, reportedly a Foley catheter is in place.  IMPRESSION: No definite acute renal findings.  Chronic right renal atrophy.  Original Report Authenticated By: Harley Hallmark, M.D.   Dg Chest Port 1 View  01/24/2012  *RADIOLOGY REPORT*  Clinical Data: Respiratory distress, follow-up  PORTABLE CHEST - 1 VIEW  Comparison: Portable chest x-ray of 01/23/2012  Findings: Patchy airspace disease remains in the mid and upper lung fields.  In view of the rapid change somewhat improved edema is the most likely consideration although pneumonia cannot be excluded. Cardiomegaly is stable.  IMPRESSION: Some improvement patchy airspace disease with opacities remaining in the upper lung fields.  Probable improving edema.  Cannot exclude pneumonia.  Original Report Authenticated By: Juline Patch, M.D.   Dg Pneumonia Chest Port1v  01/23/2012  *RADIOLOGY REPORT*  Clinical Data: 61 year old female with shortness of breath.  PORTABLE CHEST - 1 VIEW  Comparison: 01/22/2012 and earlier.  Findings: Portable semi upright AP view 0558 hours.  The patient is extubated and enteric tube removed.  Interval increased nodular and confluent bilateral pulmonary opacity, maximal in the right upper lung.  Lung volumes appear slightly lower. Stable cardiomegaly  and mediastinal contours.  No pneumothorax.  No large effusion.  IMPRESSION: 1.  Extubated and enteric tube removed. 2.  Increased bilateral nodular and confluent opacity. Differential considerations include acute/worsening pulmonary edema, less likely increased infection.  Original Report Authenticated By: Harley Hallmark, M.D.   BMET  Lab 01/24/12 0450 01/23/12 0500 01/22/12 0500 01/21/12 1633 01/21/12 1544  NA 142  143 142 144 138  K 3.8 4.2 4.0 3.8 6.0*  CL 106 109 109 110 104  CO2 27 26 23  -- 20  GLUCOSE 133* 146* 119* 360* 320*  BUN 30* 30* 29* 25* 22  CREATININE 2.39* 2.56* 2.19* 1.50* 1.54*  ALB -- -- -- -- --  CALCIUM 8.9 8.6 8.2* -- 9.3  PHOS 3.5 4.3 3.9 -- --   CBC  Lab 01/24/12 0450 01/23/12 0500 01/22/12 0500 01/21/12 1633 01/21/12 1544  WBC 10.3 10.0 13.9* -- 10.6*  NEUTROABS -- -- -- -- 8.0*  HGB 8.4* 8.5* 8.2* 10.5* --  HCT 28.1* 28.7* 26.8* 31.0* --  MCV 85.7 86.7 86.5 -- 88.0  PLT 272 230 211 -- 357   Kerryann Allaire C

## 2012-01-24 NOTE — Progress Notes (Signed)
Clinical Social Worker completed psychosocial assessment, please see shadow chart for details.  Of note: Pt has a strong support network, plan is to dc home with hh.  CSW signing off at this time, please re consult if needed.   Angelia Mould, MSW, [place] [phone]

## 2012-01-24 NOTE — Progress Notes (Signed)
ANTIBIOTIC CONSULT NOTE - FOLLOW UP  Pharmacy Consult for Vanc/Azactam Indication: pneumonia  Allergies  Allergen Reactions  . Daptomycin Other (See Comments)    Elevated CPK  . Morphine And Related Nausea And Vomiting  . Keflex Rash    unknown  . Celecoxib     unknown  . Codeine     unknown  . Fluoxetine Hcl     unknown  . Latex     REACTION: undefined  . Ofloxacin     unknown  . Penicillins     unknown  . Rofecoxib     unknown  . Sulfonamide Derivatives     unknown    Patient Measurements: Height: 5\' 5"  (165.1 cm) Weight: 294 lb 5 oz (133.5 kg) IBW/kg (Calculated) : 57   Vital Signs: Temp: 98.3 F (36.8 C) (02/15 1000) Temp src: Core (Comment) (02/15 1000) BP: 172/57 mmHg (02/15 1000) Pulse Rate: 81  (02/15 1100) Intake/Output from previous day: 02/14 0701 - 02/15 0700 In: 1270 [I.V.:360; IV Piggyback:910] Out: 5730 [Urine:5730] Intake/Output from this shift: Total I/O In: 110 [I.V.:60; IV Piggyback:50] Out: 225 [Urine:225]  Labs:  [facility] 01/24/12 0450 01/23/12 0941 01/23/12 0500 01/22/12 0500  WBC 10.3 -- 10.0 13.9*  HGB 8.4* -- 8.5* 8.2*  PLT 272 -- 230 211  LABCREA -- 66.16 -- --  CREATININE 2.39* -- 2.56* 2.19*   Estimated Creatinine Clearance: 34.2 ml/min (by C-G formula based on Cr of 2.39). No results found for this basename: VANCOTROUGH:2,VANCOPEAK:2,VANCORANDOM:2,GENTTROUGH:2,GENTPEAK:2,GENTRANDOM:2,TOBRATROUGH:2,TOBRAPEAK:2,TOBRARND:2,AMIKACINPEAK:2,AMIKACINTROU:2,AMIKACIN:2, in the last 72 hours   Microbiology: Recent Results (from the past 720 hour(s))  CULTURE, BLOOD (ROUTINE X 2)     Status: Normal (Preliminary result)   Collection Time   01/21/12  5:45 PM      Component Value Range Status Comment   Specimen Description BLOOD LEG RIGHT   Final    Special Requests BOTTLES DRAWN AEROBIC AND ANAEROBIC 5CC   Final    Culture  Setup Time [id number]   Final    Culture     Final    Value:        BLOOD CULTURE RECEIVED NO GROWTH TO  DATE CULTURE WILL BE HELD FOR 5 DAYS BEFORE ISSUING A FINAL NEGATIVE REPORT   Report Status PENDING   Incomplete   CULTURE, BLOOD (ROUTINE X 2)     Status: Normal (Preliminary result)   Collection Time   01/21/12  5:58 PM      Component Value Range Status Comment   Specimen Description BLOOD ARM RIGHT   Final    Special Requests BOTTLES DRAWN AEROBIC ONLY 10CC   Final    Culture  Setup Time [id number]   Final    Culture     Final    Value:        BLOOD CULTURE RECEIVED NO GROWTH TO DATE CULTURE WILL BE HELD FOR 5 DAYS BEFORE ISSUING A FINAL NEGATIVE REPORT   Report Status PENDING   Incomplete   MRSA PCR SCREENING     Status: Normal   Collection Time   01/21/12  7:09 PM      Component Value Range Status Comment   MRSA by PCR NEGATIVE  NEGATIVE  Final   CLOSTRIDIUM DIFFICILE BY PCR     Status: Normal   Collection Time   01/23/12  3:00 PM      Component Value Range Status Comment   C difficile by pcr NEGATIVE  NEGATIVE  Final     Anti-infectives     Start  Dose/Rate Route Frequency Ordered Stop   01/22/12 2000   vancomycin (VANCOCIN) 1,500 mg in sodium chloride 0.9 % 500 mL IVPB        1,500 mg 250 mL/hr over 120 Minutes Intravenous Every 24 hours 01/21/12 2322     01/21/12 1800   aztreonam (AZACTAM) 1 g in dextrose 5 % 50 mL IVPB        1 g 100 mL/hr over 30 Minutes Intravenous 3 times per day 01/21/12 1743     01/21/12 1715   azithromycin (ZITHROMAX) 500 mg in dextrose 5 % 250 mL IVPB        500 mg 250 mL/hr over 60 Minutes Intravenous Every 24 hours 01/21/12 1701     01/21/12 1630   vancomycin (VANCOCIN) 2,000 mg in sodium chloride 0.9 % 500 mL IVPB  Status:  Discontinued        2,000 mg 250 mL/hr over 120 Minutes Intravenous  Once 01/21/12 1616 01/23/12 1321          Assessment: 61 y/o female patient receiving day #3 vanc/azactam/zithromax for HCAP. All cx ngtd, currently afebrile. Renal fxn poor, crcl 34. Will plan on checking vanc trough tomorrow at steady state.  Consider d/c zithromax, strep ag neg.  Goal of Therapy:  Vancomycin trough level 15-20 mcg/ml  Plan:  Continue abx, will follow. Measure antibiotic drug levels at steady state  Verlene Mayer, PharmD, [place] Pager [phone] 01/24/2012,11:45 AM

## 2012-01-24 NOTE — Progress Notes (Signed)
Subjective:  Patient denies any chest pain or shortness of breath States feels much better today Patient ate breakfast this morning will try to do resting nuclear study today and stress her tomorrow   Objective:  Vital Signs in the last 24 hours: Temp:  [98.3 F (36.8 C)-100.8 F (38.2 C)] 98.3 F (36.8 C) (02/15 1000) Pulse Rate:  [77-101] 81  (02/15 1100) Resp:  [15-35] 26  (02/15 1100) BP: (144-207)/(40-82) 172/62 mmHg (02/15 1227) SpO2:  [88 %-99 %] 95 % (02/15 1118) FiO2 (%):  [35 %-50 %] 35 % (02/15 0800)  Intake/Output from previous day: 02/14 0701 - 02/15 0700 In: 1270 [I.V.:360; IV Piggyback:910] Out: 5730 [Urine:5730] Intake/Output from this shift: Total I/O In: 110 [I.V.:60; IV Piggyback:50] Out: 225 [Urine:225]  Physical Exam: General appearance: alert and cooperative Neck: no carotid bruit, no JVD and supple, symmetrical, trachea midline Lungs: Decreased breath sounds at bases with occasional rhonchi and rales Heart: regular rate and rhythm, S1, S2 normal, no murmur, click, rub or gallop Abdomen: soft, non-tender; bowel sounds normal; no masses,  no organomegaly Extremities: No clubbing cyanosis 1+ edema with chronic dermatosis  Lab Results:  Basename 01/24/12 0450 01/23/12 0500  WBC 10.3 10.0  HGB 8.4* 8.5*  PLT 272 230    Basename 01/24/12 0450 01/23/12 0500  NA 142 143  K 3.8 4.2  CL 106 109  CO2 27 26  GLUCOSE 133* 146*  BUN 30* 30*  CREATININE 2.39* 2.56*    Basename 01/23/12 0500 01/22/12 2221  TROPONINI 0.73* 0.95*   Hepatic Function Panel  Basename 01/21/12 1544  PROT 8.2  ALBUMIN 3.3*  AST 50*  ALT 15  ALKPHOS 96  BILITOT 0.3  BILIDIR --  IBILI --   No results found for this basename: CHOL in the last 72 hours No results found for this basename: PROTIME in the last 72 hours  Imaging: Imaging results have been reviewed and US Renal Port  01/23/2012  *RADIOLOGY REPORT*  Clinical Data: 61 year old female with acute on chronic  kidney disease.  ULTRASOUND PORTABLE RENAL  Technique:  Complete ultrasound examination of the urinary tract was performed including evaluation of the kidneys, renal collecting systems, and urinary bladder.  Comparison: CT abdomen pelvis 11/23/2011.  Findings:  Right Kidney:  No hydronephrosis.  Renal length 12.4 cm.  Stable atrophy/cortical thinning.  Increased cortical echotexture.  Stable small simple appearing cysts up to 18 mm in diameter.  Left kidney: Suboptimal detail due to body habitus.  No hydronephrosis.  Renal length 12.9 cm.  Cortical thickness and echotexture better preserved and similar to the prior CT.  Bladder:  Not visualized, reportedly a Foley catheter is in place.  IMPRESSION: No definite acute renal findings.  Chronic right renal atrophy.  Original Report Authenticated By: Harley Hallmark, M.D.   Dg Chest Port 1 View  01/24/2012  *RADIOLOGY REPORT*  Clinical Data: Respiratory distress, follow-up  PORTABLE CHEST - 1 VIEW  Comparison: Portable chest x-ray of 01/23/2012  Findings: Patchy airspace disease remains in the mid and upper lung fields.  In view of the rapid change somewhat improved edema is the most likely consideration although pneumonia cannot be excluded. Cardiomegaly is stable.  IMPRESSION: Some improvement patchy airspace disease with opacities remaining in the upper lung fields.  Probable improving edema.  Cannot exclude pneumonia.  Original Report Authenticated By: Juline Patch, M.D.   Dg Pneumonia Chest Port1v  01/23/2012  *RADIOLOGY REPORT*  Clinical Data: 61 year old female with shortness of breath.  PORTABLE CHEST - 1 VIEW  Comparison: 01/22/2012 and earlier.  Findings: Portable semi upright AP view 0558 hours.  The patient is extubated and enteric tube removed.  Interval increased nodular and confluent bilateral pulmonary opacity, maximal in the right upper lung.  Lung volumes appear slightly lower. Stable cardiomegaly and mediastinal contours.  No pneumothorax.  No  large effusion.  IMPRESSION: 1.  Extubated and enteric tube removed. 2.  Increased bilateral nodular and confluent opacity. Differential considerations include acute/worsening pulmonary edema, less likely increased infection.  Original Report Authenticated By: Harley Hallmark, M.D.    Cardiac Studies:  Assessment/Plan:  Status post probable very small non-Q-wave myocardial infarction with elevated troponin I. 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 fib flutter status post TEE cardioversion in the past History of septic prosthetic left knee History of psoriasis Anemia of chronic disease Stage IV chronic kidney disease Morbid obesity Hypercholesteremia Plan Continue present management Her schedule for nuclear stress test tomorrow resting nuclear study today Dr. Algie Coffer is on call for weekend  LOS: 3 days    [name] N 01/24/2012, 12:49 PM

## 2012-01-24 NOTE — Progress Notes (Signed)
RT NOTE: placed pt on CPAP 5cmH20 with nasal mask and 2l 02 bleed in. Pt tolerating well, RT will monitor.

## 2012-01-24 NOTE — Progress Notes (Signed)
Physical Therapy Treatment Patient Details Name: [name] MRN: [id number] DOB: [birth year] Today's Date: 01/24/2012  PT Assessment/Plan  PT - Assessment/Plan Comments on Treatment Session: Pt with respiratory failure progressing with activities and transfers today. Pt incontinent of stool on arrival and assist with pericare x2 during session. Pt very pleasant and progressing well. Pt sats remained above 90 throughout on 2L. PT Plan: Discharge plan needs to be updated Follow Up Recommendations: Home health PT;Supervision for mobility/OOB Equipment Recommended: None recommended by PT PT Goals  Acute Rehab PT Goals PT Goal: Supine/Side to Sit - Progress: Progressing toward goal PT Goal: Sit to Supine/Side - Progress: Progressing toward goal Pt will go Sit to Stand: with modified independence PT Goal: Sit to Stand - Progress: Updated due to goal met Pt will go Stand to Sit: with modified independence PT Goal: Stand to Sit - Progress: Updated due to goals met Pt will Transfer Bed to Chair/Chair to Bed: with supervision PT Transfer Goal: Bed to Chair/Chair to Bed - Progress: Updated due to goal met PT Goal: Ambulate - Progress: Progressing toward goal PT Goal: Up/Down Stairs - Progress: Progressing toward goal  PT Treatment Precautions/Restrictions  Precautions Precautions: Fall Precaution Comments: O2 Mobility (including Balance) Bed Mobility Rolling Right: 6: Modified independent (Device/Increase time);With rail Rolling Left: 5: Supervision;With rail Rolling Left Details (indicate cue type and reason): supervision for lines Left Sidelying to Sit: 4: Min assist;HOB flat;With rails Left Sidelying to Sit Details (indicate cue type and reason): cueing for sequence and assist to complete trunk elevation Transfers Transfers: Yes Sit to Stand: 4: Min assist;From bed;From chair/3-in-1 Sit to Stand Details (indicate cue type and reason): pt stood with assist to stabilize walker,  minguard, 3 trials Stand to Sit: 5: Supervision;To chair/3-in-1 Stand to Sit Details: pt stood and sat from chair X 2 to use bed pan in chair secondary to urgency of BM. Pt stood at chair approximately 2 min for pericare after BM Stand Pivot Transfers: 4: Min Actuary Details (indicate cue type and reason): bed to recliner with RW with cues for safety minguard Ambulation/Gait Ambulation/Gait: No  Posture/Postural Control Posture/Postural Control: Postural limitations Postural Limitations: flexed trunk in standing Balance Balance Assessed:  (standing not assessed, RW at baseline) Static Sitting Balance Static Sitting - Balance Support: Feet supported Static Sitting - Level of Assistance: 6: Modified independent (Device/Increase time) Exercise  General Exercises - Lower Extremity Long Arc Quad: AROM;10 reps;Both;Seated End of Session PT - End of Session Activity Tolerance: Patient limited by fatigue Patient left: in chair;with call bell in reach Nurse Communication: Mobility status for transfers General Behavior During Session: [facility] for tasks performed Cognition: [facility] for tasks performed  Delorse Lek 01/24/2012, 11:24 AM Toney Sang, PT [phone]

## 2012-01-25 ENCOUNTER — Inpatient Hospital Stay (HOSPITAL_COMMUNITY): Payer: Medicare HMO

## 2012-01-25 LAB — CBC
MCV: 86.3 fL (ref 78.0–100.0)
Platelets: 278 10*3/uL (ref 150–400)
RBC: 3.35 MIL/uL — ABNORMAL LOW (ref 3.87–5.11)
RDW: 20.6 % — ABNORMAL HIGH (ref 11.5–15.5)
WBC: 8.8 10*3/uL (ref 4.0–10.5)

## 2012-01-25 LAB — GLUCOSE, CAPILLARY
Glucose-Capillary: 109 mg/dL — ABNORMAL HIGH (ref 70–99)
Glucose-Capillary: 115 mg/dL — ABNORMAL HIGH (ref 70–99)
Glucose-Capillary: 202 mg/dL — ABNORMAL HIGH (ref 70–99)

## 2012-01-25 LAB — BASIC METABOLIC PANEL
CO2: 26 mEq/L (ref 19–32)
Calcium: 8.8 mg/dL (ref 8.4–10.5)
Creatinine, Ser: 2.35 mg/dL — ABNORMAL HIGH (ref 0.50–1.10)
GFR calc non Af Amer: 21 mL/min — ABNORMAL LOW (ref >90)
Sodium: 144 mEq/L (ref 135–145)

## 2012-01-25 LAB — PROTIME-INR
INR: 1.73 — ABNORMAL HIGH (ref 0.00–1.49)
Prothrombin Time: 20.6 seconds — ABNORMAL HIGH (ref 11.6–15.2)

## 2012-01-25 LAB — HEPARIN LEVEL (UNFRACTIONATED): Heparin Unfractionated: 0.14 IU/mL — ABNORMAL LOW (ref 0.30–0.70)

## 2012-01-25 MED ORDER — REGADENOSON 0.4 MG/5ML IV SOLN
0.4000 mg | Freq: Once | INTRAVENOUS | Status: AC
  Administered 2012-01-25: 0.4 mg via INTRAVENOUS

## 2012-01-25 MED ORDER — ALPRAZOLAM 0.25 MG PO TABS
0.2500 mg | ORAL_TABLET | Freq: Three times a day (TID) | ORAL | Status: DC | PRN
  Administered 2012-01-25 – 2012-01-28 (×3): 0.25 mg via ORAL
  Filled 2012-01-25 (×3): qty 1

## 2012-01-25 MED ORDER — SODIUM CHLORIDE 0.9 % IJ SOLN
INTRAMUSCULAR | Status: AC
  Administered 2012-01-25: 10 mL
  Filled 2012-01-25: qty 20

## 2012-01-25 MED ORDER — CHOLESTYRAMINE 4 G PO PACK
4.0000 g | PACK | Freq: Two times a day (BID) | ORAL | Status: DC
  Administered 2012-01-25 – 2012-01-26 (×3): 4 g via ORAL
  Filled 2012-01-25 (×4): qty 1

## 2012-01-25 MED ORDER — TECHNETIUM TC 99M TETROFOSMIN IV KIT
30.0000 | PACK | Freq: Once | INTRAVENOUS | Status: AC | PRN
  Administered 2012-01-24: 30 via INTRAVENOUS

## 2012-01-25 MED ORDER — FUROSEMIDE 10 MG/ML IJ SOLN
60.0000 mg | Freq: Three times a day (TID) | INTRAMUSCULAR | Status: DC
  Administered 2012-01-25 – 2012-01-26 (×4): 60 mg via INTRAVENOUS
  Filled 2012-01-25 (×6): qty 6

## 2012-01-25 MED ORDER — ALBUTEROL SULFATE (5 MG/ML) 0.5% IN NEBU
2.5000 mg | INHALATION_SOLUTION | Freq: Four times a day (QID) | RESPIRATORY_TRACT | Status: DC
  Administered 2012-01-25 – 2012-01-27 (×10): 2.5 mg via RESPIRATORY_TRACT
  Filled 2012-01-25 (×10): qty 0.5

## 2012-01-25 MED ORDER — HEPARIN SOD (PORCINE) IN D5W 100 UNIT/ML IV SOLN
2000.0000 [IU]/h | INTRAVENOUS | Status: DC
  Administered 2012-01-25: 1450 [IU]/h via INTRAVENOUS
  Administered 2012-01-26: 1700 [IU]/h via INTRAVENOUS
  Filled 2012-01-25 (×4): qty 250

## 2012-01-25 MED ORDER — TECHNETIUM TC 99M TETROFOSMIN IV KIT
30.0000 | PACK | Freq: Once | INTRAVENOUS | Status: AC | PRN
  Administered 2012-01-25: 30 via INTRAVENOUS

## 2012-01-25 MED ORDER — IPRATROPIUM BROMIDE 0.02 % IN SOLN
0.5000 mg | Freq: Four times a day (QID) | RESPIRATORY_TRACT | Status: DC
  Administered 2012-01-25 – 2012-01-27 (×10): 0.5 mg via RESPIRATORY_TRACT
  Filled 2012-01-25 (×10): qty 2.5

## 2012-01-25 NOTE — Progress Notes (Signed)
  ANTICOAGULATION CONSULT NOTE - Initial Consult  Pharmacy Consult for heparin Indication: atrial fibrillation/cva  Allergies  Allergen Reactions  . Daptomycin Other (See Comments)    Elevated CPK  . Morphine And Related Nausea And Vomiting  . Keflex Rash    unknown  . Celecoxib     unknown  . Codeine     unknown  . Fluoxetine Hcl     unknown  . Latex     REACTION: undefined  . Ofloxacin     unknown  . Penicillins     unknown  . Rofecoxib     unknown  . Sulfonamide Derivatives     unknown     Labs:  Basename 01/25/12 0410 01/24/12 0450 01/23/12 0500 01/22/12 2221 01/22/12 1446  HGB 8.7* 8.4* -- -- --  HCT 28.9* 28.1* 28.7* -- --  PLT 278 272 230 -- --  APTT -- -- -- -- --  LABPROT 20.6* 23.9* 24.5* -- --  INR 1.73* 2.10* 2.16* -- --  HEPARINUNFRC -- -- -- -- --  CREATININE 2.35* 2.39* 2.56* -- --  CKTOTAL -- -- 56 60 80  CKMB -- -- 2.3 2.7 4.4*  TROPONINI -- -- 0.73* 0.95* 1.17*   Medical History: Past Medical History  Diagnosis Date  . Asthma   . Bronchitis   . Allergic rhinitis   . Diabetes mellitus   . Stroke   . HTN (hypertension)   . OSA (obstructive sleep apnea)   . DJD (degenerative joint disease)   . Kidney stones   . Depression   . Hyperlipidemia   . Complication of anesthesia     trouble waking up   Assessment: 61 yo F admitted with SOB and intubated in ED. Patient recently admitted in December on Coumadin for AFib/hx CVA and Vancomycin, Aztreonam, and Azithromycin empiric for HCAP. Medication Review:   Anticoagulation: Coumadin PTA for Afib and CVA, Called Dr Annitta Jersey office on 2/13 and he states patient prescribed 5mg  po daily. INR 1.73 today (5mg  dose given 2/13). Spoke with Dr Sharyn Lull - will d/c warfarin. Will start heparin today.   Infectious Disease: day#4 Vancomycin, Aztreonam, and Azithromycin (2/12 >> present) empiric for HCAP (multiple allergies), MRSA PCR NEG, BCx and Sputum Cx neg to date. cdiff neg Afebrile. PCT 3.47. WBC  normal. (in Aug, on Vanc 1g q24, trough 21 with slightly worsened renal function). Strep neg. Will leave note to see if abx can be dced  Cardiovascular: Afib/HTN/DHF. BP elevated, HR 70-80s. Bp 166/61 today. Metoprolol, Aldactone, amio. Stress test today.  Endocrinology: CBGs 119-136. SSl.   Gastrointestinal / Nutrition: PPI, NPO. LFTs ok. .  Neurology: no pain reported  Nephrology: SCr 2.35/estCrCl~29ml/min, lytes wnl. Mag 1.9-stable.  Pulmonary: Acute Respiratory Failure-Intubated in ED 2/12. Extubated 2/13, 97% on 2L [place].   Hematology / Oncology: Hgb stable at 8.7, platelets ok.   Best Practices: heparin, PPI for SUP  PTA Medication Issues: Not yet resumed- Allopurinol, Lipitor, Albuterol, Clobetasol ointment, Digoxin, Diltiazem, Flonase, Lasix, Lantus, Ambien.  **Patient tells me she doesn't take Methotrexate anymore (?Psoriasis per Dr. Yetta Barre- however patient states has not seen Dr. Yetta Barre in years).  Plan:  1. Heparin at 1450 units/hr 2. Check 8 hr heparin level 3. Continue vancomycin 1500 mg iv q24h, check trough at Css, if needed. 4. Continue aztreonam q8h

## 2012-01-25 NOTE — Progress Notes (Signed)
[name] is a 61 y.o. female admitted on 01/21/2012 with change mental status, dyspnea, HTN, and VDRF. PMHx Asthma, A fib on coumadin, Prosthetic knee infection with MRSA, systolic/diastolic CHF, DM, CVA, HTN, OSA, Depression, Hyperlipidemia, Psoriasis on MTX  Line/tube: ETT 2/12>>2/13 Rt femoral CVL 2/12>>2/16  Cultures: Blood 2/12>> Sputum 2/12>> MRSA screen 2/12>>negative C diff PCR 2/14>>negative  Antibiotics: Vanco 2/12 (?HCAP)>>>2/16 Azithro 2/12 (?HCAP)>>> 2/16 Azactam (?HCAP)>>> 2/16  Tests/events: 2/13 Echo>>mild LVH, EF 50 to 55%, grade 1 diastolic dysfx, mild LA dilation 2/14 Renal u/s>>no acute findings, chronic Rt renal atrophy 2/16 NM Myocar>>no stress induced ischemia  SUBJECTIVE: Breathing better after getting lasix.  C/o diarrhea.  Denies chest congestion, sputum.  OBJECTIVE:  Blood pressure 179/61, pulse 69, temperature 98 F (36.7 C), temperature source Oral, resp. rate 24, height 5\' 5"  (1.651 m), weight 283 lb 1.1 oz (128.4 kg), SpO2 98.00%. Wt Readings from Last 3 Encounters:  01/25/12 283 lb 1.1 oz (128.4 kg)  11/25/11 285 lb 15 oz (129.7 kg)  11/06/11 296 lb 6.4 oz (134.446 kg)   Body mass index is 47.11 kg/(m^2).  I/O last 3 completed shifts: In: 1520 [I.V.:360; IV Piggyback:1160] Out: 3910 [Urine:3910]  General - no distress HEENT - no sinus tenderness, voice hoarse Cardiac - s1s2 no murmur Chest - basilar rales Abd - soft, +BS Ext - 1+ leg edema Neuro - normal strength  CBC    Component Value Date/Time   WBC 8.8 01/25/2012 0410   RBC 3.35* 01/25/2012 0410   HGB 8.7* 01/25/2012 0410   HCT 28.9* 01/25/2012 0410   PLT 278 01/25/2012 0410   MCV 86.3 01/25/2012 0410   MCH 26.0 01/25/2012 0410   MCHC 30.1 01/25/2012 0410   RDW 20.6* 01/25/2012 0410   LYMPHSABS 1.8 01/21/2012 1544   MONOABS 0.5 01/21/2012 1544   EOSABS 0.2 01/21/2012 1544   BASOSABS 0.1 01/21/2012 1544    BMET    Component Value Date/Time   NA 144 01/25/2012 0410   K  3.8 01/25/2012 0410   CL 106 01/25/2012 0410   CO2 26 01/25/2012 0410   GLUCOSE 132* 01/25/2012 0410   BUN 35* 01/25/2012 0410   CREATININE 2.35* 01/25/2012 0410   CREATININE 1.56* 09/25/2011 1058   CALCIUM 8.8 01/25/2012 0410   GFRNONAA 21* 01/25/2012 0410   GFRAA 25* 01/25/2012 0410    Nm Myocar Multi W/spect W/wall Motion / Ef  01/25/2012  *RADIOLOGY REPORT*  Clinical data: Atrial fibrillation, hypertension, shortness of breath, coronary artery disease.  NUCLEAR MEDICINE MYOCARDIAL PERFUSION IMAGING NUCLEAR MEDICINE LEFT VENTRICULAR WALL MOTION ANALYSIS NUCLEAR MEDICINE LEFT VENTRICULAR EJECTION FRACTION CALCULATION  Technique: Standard 2 day myocardial SPECT imaging was performed after resting intravenous injection of Tc-60m Myoview. After intravenous infusion of Lexiscan (regadenoson) under supervision of cardiology staff, Myoview was injected intravenously and standard myocardial SPECT imaging was performed. Quantitative gated imaging was also performed to evaluate left ventricular wall motion and estimate left ventricular ejection fraction.  Radiopharmaceutical: 30+30 mCi Tc75m Myoview IV.  Comparison: 05/03/2009  Findings:    The stress SPECT images demonstrate physiologic distribution of radiopharmaceutical. Rest images demonstrate no perfusion defects. The gated stress SPECT images demonstrate normal left ventricular myocardial thickening.  No focal wall motion abnormality is seen. Calculated left ventricular end-diastolic volume , end-systolic volume 57ml, ejection fraction of 60%.  IMPRESSION:  1. Negative for pharmacologic-stress induced ischemia.  2. Left ventricular ejection fraction 60%.  Original Report Authenticated By: Osa Craver, M.D.   US Renal Port  01/23/2012  *RADIOLOGY REPORT*  Clinical Data: 61 year old female with acute on chronic kidney disease.  ULTRASOUND PORTABLE RENAL  Technique:  Complete ultrasound examination of the urinary tract was performed including  evaluation of the kidneys, renal collecting systems, and urinary bladder.  Comparison: CT abdomen pelvis 11/23/2011.  Findings:  Right Kidney:  No hydronephrosis.  Renal length 12.4 cm.  Stable atrophy/cortical thinning.  Increased cortical echotexture.  Stable small simple appearing cysts up to 18 mm in diameter.  Left kidney: Suboptimal detail due to body habitus.  No hydronephrosis.  Renal length 12.9 cm.  Cortical thickness and echotexture better preserved and similar to the prior CT.  Bladder:  Not visualized, reportedly a Foley catheter is in place.  IMPRESSION: No definite acute renal findings.  Chronic right renal atrophy.  Original Report Authenticated By: Harley Hallmark, M.D.   Dg Chest Port 1 View  01/25/2012  *RADIOLOGY REPORT*  Clinical Data: Pleural effusion  PORTABLE CHEST - 1 VIEW  Comparison:   the previous day's study  Findings: Interval progression of moderate interstitial and alveolar opacities throughout both lungs, involving apices more than bases.  Moderate cardiomegaly stable.  No definite effusion.  IMPRESSION:  1.  Worsening bilateral infiltrates or edema.  Original Report Authenticated By: Osa Craver, M.D.   Dg Chest Port 1 View  01/24/2012  *RADIOLOGY REPORT*  Clinical Data: Respiratory distress, follow-up  PORTABLE CHEST - 1 VIEW  Comparison: Portable chest x-ray of 01/23/2012  Findings: Patchy airspace disease remains in the mid and upper lung fields.  In view of the rapid change somewhat improved edema is the most likely consideration although pneumonia cannot be excluded. Cardiomegaly is stable.  IMPRESSION: Some improvement patchy airspace disease with opacities remaining in the upper lung fields.  Probable improving edema.  Cannot exclude pneumonia.  Original Report Authenticated By: Juline Patch, M.D.    ASSESSMENT/PLAN:  Acute respiratory failure likely from acute diastolic dysfunction with acute pulmonary edema.  Pneumonia seems less likely -will d/c  abx -diuresis, BP control per primary team/cardiology/renal -f/u CXR  A fib -amiodarone, asa, metoprolol,aldactone, heparin gtt per cardiology  Hx of asthma -change to duonebs  Hx of OSA -CPAP qhs  Diarrhea; C diff negative -questran bid  DM -per primary team  CKD -renal following  Anemia -f/u CBC -transfuse for Hb < 7  Psoriasis, gout -hold MTX, allopurinol for now  Will d/c femoral central line  Please call if additional help needed.  Ardra Kuznicki Pager:  [phone] 01/25/2012, 2:26 PM

## 2012-01-25 NOTE — Progress Notes (Addendum)
ANTICOAGULATION CONSULT NOTE - Follow Up Consult  Pharmacy Consult for Heparin Indication: atrial fibrillation, CVA  Allergies  Allergen Reactions  . Daptomycin Other (See Comments)    Elevated CPK  . Morphine And Related Nausea And Vomiting  . Keflex Rash    unknown  . Celecoxib     unknown  . Codeine     unknown  . Fluoxetine Hcl     unknown  . Latex     REACTION: undefined  . Ofloxacin     unknown  . Penicillins     unknown  . Rofecoxib     unknown  . Sulfonamide Derivatives     unknown    Patient Measurements: Height: 5\' 5"  (165.1 cm) Weight: 283 lb 1.1 oz (128.4 kg) IBW/kg (Calculated) : 57    Vital Signs: Temp: 98.2 F (36.8 C) (02/16 2022) Temp src: Oral (02/16 2022) BP: 181/62 mmHg (02/16 2022) Pulse Rate: 67  (02/16 2022)  Labs:  Basename 01/25/12 2000 01/25/12 0410 01/24/12 0450 01/23/12 0500 01/22/12 2221  HGB -- 8.7* 8.4* -- --  HCT -- 28.9* 28.1* 28.7* --  PLT -- 278 272 230 --  APTT -- -- -- -- --  LABPROT -- 20.6* 23.9* 24.5* --  INR -- 1.73* 2.10* 2.16* --  HEPARINUNFRC 0.14* -- -- -- --  CREATININE -- 2.35* 2.39* 2.56* --  CKTOTAL -- -- -- 56 60  CKMB -- -- -- 2.3 2.7  TROPONINI -- -- -- 0.73* 0.95*   Estimated Creatinine Clearance: 34 ml/min (by C-G formula based on Cr of 2.35).   Medications:  Scheduled:    . ipratropium  0.5 mg Nebulization Q6H   And  . albuterol  2.5 mg Nebulization Q6H  . amiodarone  200 mg Oral Daily  . aspirin  81 mg Oral Daily  . cholestyramine  4 g Oral BID  . furosemide  60 mg Intravenous TID  . insulin aspart  0-4 Units Subcutaneous Q4H  . metoprolol tartrate  50 mg Oral BID  . pantoprazole  40 mg Oral Q1200  . regadenoson  0.4 mg Intravenous Once  . sodium chloride      . spironolactone  25 mg Oral Daily  . DISCONTD: albuterol-ipratropium  6 puff Inhalation Q6H  . DISCONTD: azithromycin  500 mg Intravenous Q24H  . DISCONTD: aztreonam  1 g Intravenous Q8H  . DISCONTD: vancomycin  1,500 mg  Intravenous Q24H   Infusions:    . heparin 1,450 Units/hr (01/25/12 1800)  . DISCONTD: sodium chloride 20 mL/hr (01/24/12 1707)    Assessment: [name] receiving Heparin via infusion for A-fib and CVA.  Current infusion rate of Heparin 1450 units/hr.  8 hour heparin level 0.14 units/ml.  Goal of Therapy:  Heparin level 0.3-0.7 units/ml   Plan:  Increase Heparin infusion to 1450 units/hr, NO load. Next Heparin level with AM labs.  [name], Elisha Headland, Pharm.D. 01/25/2012 8:58 PM  Addendum:  Corrected heparin infusion rate to be increased to 1700 units/hr.  Next Heparin level with AM labs.  [name], Elisha Headland, Pharm.D. 01/25/2012 9:10 PM

## 2012-01-25 NOTE — Progress Notes (Signed)
Subjective:  + Anxiety from Stress test.  Objective:  Vital Signs in the last 24 hours: Temp:  [98.1 F (36.7 C)-98.9 F (37.2 C)] 98.7 F (37.1 C) (02/16 0729) Pulse Rate:  [66-82] 69  (02/16 0400) Cardiac Rhythm:  [-] Normal sinus rhythm (02/16 0729) Resp:  [14-25] 24  (02/16 0434) BP: (151-208)/(49-79) 201/59 mmHg (02/16 0918) SpO2:  [95 %-99 %] 97 % (02/16 0729) Weight:  [128.4 kg (283 lb 1.1 oz)] 128.4 kg (283 lb 1.1 oz) (02/16 0400)  Physical Exam: BP Readings from Last 1 Encounters:  01/25/12 201/59    Wt Readings from Last 1 Encounters:  01/25/12 128.4 kg (283 lb 1.1 oz)    Weight change:   HEENT: [name]/AT, Eyes-Brown, PERL, EOMI, Conjunctiva-Pale pink, Sclera-Non-icteric Neck: No JVD, No bruit, Trachea midline. Lungs:  Clear, Bilateral. Cardiac:  Regular rhythm, normal S1 and S2, no S3.  Abdomen:  Soft, distended, non-tender. Extremities:  No edema present. No cyanosis. No clubbing. CNS: AxOx3, Cranial nerves grossly intact, moves all 4 extremities. Right handed. Skin: Warm and dry. Skin thickening with chronic discoloration of both legs.   Intake/Output from previous day: 02/15 0701 - 02/16 0700 In: 780 [I.V.:180; IV Piggyback:600] Out: 2010 [Urine:2010]    Lab Results: BMET    Component Value Date/Time   NA 144 01/25/2012 0410   K 3.8 01/25/2012 0410   CL 106 01/25/2012 0410   CO2 26 01/25/2012 0410   GLUCOSE 132* 01/25/2012 0410   BUN 35* 01/25/2012 0410   CREATININE 2.35* 01/25/2012 0410   CREATININE 1.56* 09/25/2011 1058   CALCIUM 8.8 01/25/2012 0410   GFRNONAA 21* 01/25/2012 0410   GFRAA 25* 01/25/2012 0410   CBC    Component Value Date/Time   WBC 8.8 01/25/2012 0410   RBC 3.35* 01/25/2012 0410   HGB 8.7* 01/25/2012 0410   HCT 28.9* 01/25/2012 0410   PLT 278 01/25/2012 0410   MCV 86.3 01/25/2012 0410   MCH 26.0 01/25/2012 0410   MCHC 30.1 01/25/2012 0410   RDW 20.6* 01/25/2012 0410   LYMPHSABS 1.8 01/21/2012 1544   MONOABS 0.5 01/21/2012 1544   EOSABS 0.2  01/21/2012 1544   BASOSABS 0.1 01/21/2012 1544   CARDIAC ENZYMES Lab Results  Component Value Date   CKTOTAL 56 01/23/2012   CKMB 2.3 01/23/2012   TROPONINI 0.73* 01/23/2012    Assessment/Plan:  Patient Active Hospital Problem List:  Status post very small non-Q-wave myocardial infarction with elevated troponin I.  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 fib flutter status post TEE cardioversion in the past  History of septic prosthetic left knee  History of psoriasis  Anemia of chronic disease  Stage IV chronic kidney disease  Morbid obesity  Hypercholesteremia   Plan  Continue present management EKG and chest pain negative Lexiscan Myoview Stress Test.    LOS: 4 days    Orpah Cobb  MD  01/25/2012, 11:20 AM

## 2012-01-25 NOTE — Progress Notes (Signed)
Subjective: UOP 2010, 780 in .  CXR with worsening bilat pulm infiltrates/edema today. Down for stress test now.    Objective Vital signs in last 24 hours: Filed Vitals:   01/25/12 0911 01/25/12 0916 01/25/12 0917 01/25/12 0918  BP: 204/60 207/64 204/56 201/59  Pulse:      Temp:      TempSrc:      Resp:      Height:      Weight:      SpO2:       Weight change:   Intake/Output Summary (Last 24 hours) at 01/25/12 1018 Last data filed at 01/25/12 0659  Gross per 24 hour  Intake    690 ml  Output   1785 ml  Net  -1095 ml   Labs: Basic Metabolic Panel:  Lab 01/25/12 0960 01/24/12 0450 01/23/12 0500 01/22/12 0500 01/21/12 1633 01/21/12 1544  NA 144 142 143 142 144 138  K 3.8 3.8 4.2 4.0 3.8 6.0*  CL 106 106 109 109 110 104  CO2 26 27 26 23  -- 20  GLUCOSE 132* 133* 146* 119* 360* 320*  BUN 35* 30* 30* 29* 25* 22  CREATININE 2.35* 2.39* 2.56* 2.19* 1.50* 1.54*  ALB -- -- -- -- -- --  CALCIUM 8.8 8.9 8.6 8.2* -- 9.3  PHOS -- 3.5 4.3 3.9 -- --   Liver Function Tests:  Lab 01/21/12 1544  AST 50*  ALT 15  ALKPHOS 96  BILITOT 0.3  PROT 8.2  ALBUMIN 3.3*   No results found for this basename: LIPASE:3,AMYLASE:3 in the last 168 hours No results found for this basename: AMMONIA:3 in the last 168 hours CBC:  Lab 01/25/12 0410 01/24/12 0450 01/23/12 0500 01/22/12 0500 01/21/12 1544  WBC 8.8 10.3 10.0 13.9* --  NEUTROABS -- -- -- -- 8.0*  HGB 8.7* 8.4* 8.5* 8.2* --  HCT 28.9* 28.1* 28.7* 26.8* --  MCV 86.3 85.7 86.7 86.5 --  PLT 278 272 230 211 --   PT/INR: @labrcntip (inr:5) Cardiac Enzymes:  Lab 01/23/12 0500 01/22/12 2221 01/22/12 1446 01/22/12 0600 01/21/12 2307  CKTOTAL 56 60 80 94 98  CKMB 2.3 2.7 4.4* 6.4* 6.3*  CKMBINDEX -- -- -- -- --  TROPONINI 0.73* 0.95* 1.17* 2.08* 1.96*   CBG:  Lab 01/25/12 0416 01/24/12 2022 01/24/12 1626 01/24/12 1157 01/24/12 0401  GLUCAP 119* 136* 154* 139* 140*    Iron Studies: No results found for this basename:  IRON:30,TIBC:30,TRANSFERRIN:30,FERRITIN:30 in the last 168 hours Studies/Results: US Renal Port  01/23/2012  *RADIOLOGY REPORT*  Clinical Data: 61 year old female with acute on chronic kidney disease.  ULTRASOUND PORTABLE RENAL  Technique:  Complete ultrasound examination of the urinary tract was performed including evaluation of the kidneys, renal collecting systems, and urinary bladder.  Comparison: CT abdomen pelvis 11/23/2011.  Findings:  Right Kidney:  No hydronephrosis.  Renal length 12.4 cm.  Stable atrophy/cortical thinning.  Increased cortical echotexture.  Stable small simple appearing cysts up to 18 mm in diameter.  Left kidney: Suboptimal detail due to body habitus.  No hydronephrosis.  Renal length 12.9 cm.  Cortical thickness and echotexture better preserved and similar to the prior CT.  Bladder:  Not visualized, reportedly a Foley catheter is in place.  IMPRESSION: No definite acute renal findings.  Chronic right renal atrophy.  Original Report Authenticated By: Harley Hallmark, M.D.   Dg Chest Port 1 View  01/25/2012  *RADIOLOGY REPORT*  Clinical Data: Pleural effusion  PORTABLE CHEST - 1 VIEW  Comparison:  the previous day's study  Findings: Interval progression of moderate interstitial and alveolar opacities throughout both lungs, involving apices more than bases.  Moderate cardiomegaly stable.  No definite effusion.  IMPRESSION:  1.  Worsening bilateral infiltrates or edema.  Original Report Authenticated By: Osa Craver, M.D.   Dg Chest Port 1 View  01/24/2012  *RADIOLOGY REPORT*  Clinical Data: Respiratory distress, follow-up  PORTABLE CHEST - 1 VIEW  Comparison: Portable chest x-ray of 01/23/2012  Findings: Patchy airspace disease remains in the mid and upper lung fields.  In view of the rapid change somewhat improved edema is the most likely consideration although pneumonia cannot be excluded. Cardiomegaly is stable.  IMPRESSION: Some improvement patchy airspace disease with  opacities remaining in the upper lung fields.  Probable improving edema.  Cannot exclude pneumonia.  Original Report Authenticated By: Juline Patch, M.D.   Medications:    . heparin    . DISCONTD: sodium chloride 20 mL/hr (01/24/12 1707)      . albuterol-ipratropium  6 puff Inhalation Q6H  . amiodarone  200 mg Oral Daily  . aspirin  81 mg Oral Daily  . azithromycin  500 mg Intravenous Q24H  . aztreonam  1 g Intravenous Q8H  . furosemide  80 mg Oral Daily  . insulin aspart  0-4 Units Subcutaneous Q4H  . metoprolol tartrate  50 mg Oral BID  . pantoprazole  40 mg Oral Q1200  . regadenoson  0.4 mg Intravenous Once  . sodium chloride      . spironolactone  25 mg Oral Daily  . vancomycin  1,500 mg Intravenous Q24H  . white petrolatum      . DISCONTD: pantoprazole (PROTONIX) IV  40 mg Intravenous QHS    I  have reviewed scheduled and prn medications.  Physical Exam:  Blood pressure 201/59, pulse 69, temperature 98.7 F (37.1 C), temperature source Oral, resp. rate 24, height 5\' 5"  (1.651 m), weight 128.4 kg (283 lb 1.1 oz), SpO2 97.00%.  Gen: looks SOB Skin: no rash, cyanosis Neck: no JVD, bruits or LAN Chest: bilat rales at bases Heart: regular, no rub or gallop Abdomen: soft, obese Ext: 2+ edema LE's Neuro: alert, Ox3, no focal deficit Heme/Lymph: no bruising or LAN  Impression: 1. Acute on CKD (baseline creat 1.5-2.1) - creat down slightly last two days. US showed normal size kidneys, chronic R cortical thinning. UA > 300 prot. Hemodynamic.  2. Pulm edema/volume excess/bilat pulm infiltrates- cxr today still showing bilat infiltrates, last normal film was 2/13.  Restart IV lasix. Was on lasix 40 bid and spironlactone at home. Has diuresed some but still wet.  3. HTN emergency- improved 4. Afib on coumadin 5. Diast CHF- EF 50-55% 2/13 6. VDRF- extubated on 01/22/12 7. + trop- getting Nuclear stress test today 8. Hx psoriasis- methotrexate on hold  Recommend: High dose IV  lasix.  Vinson Moselle  MD [employer] [employer] [phone] pgr    [phone] cell 01/25/2012, 10:18 AM

## 2012-01-26 LAB — RENAL FUNCTION PANEL
CO2: 31 mEq/L (ref 19–32)
Chloride: 103 mEq/L (ref 96–112)
GFR calc Af Amer: 23 mL/min — ABNORMAL LOW (ref >90)
Glucose, Bld: 153 mg/dL — ABNORMAL HIGH (ref 70–99)
Potassium: 4 mEq/L (ref 3.5–5.1)
Sodium: 143 mEq/L (ref 135–145)

## 2012-01-26 LAB — GLUCOSE, CAPILLARY
Glucose-Capillary: 130 mg/dL — ABNORMAL HIGH (ref 70–99)
Glucose-Capillary: 151 mg/dL — ABNORMAL HIGH (ref 70–99)
Glucose-Capillary: 151 mg/dL — ABNORMAL HIGH (ref 70–99)
Glucose-Capillary: 157 mg/dL — ABNORMAL HIGH (ref 70–99)
Glucose-Capillary: 159 mg/dL — ABNORMAL HIGH (ref 70–99)

## 2012-01-26 LAB — HEPARIN LEVEL (UNFRACTIONATED): Heparin Unfractionated: 0.52 IU/mL (ref 0.30–0.70)

## 2012-01-26 MED ORDER — ZOLPIDEM TARTRATE 5 MG PO TABS
5.0000 mg | ORAL_TABLET | Freq: Every evening | ORAL | Status: DC | PRN
  Administered 2012-01-26: 5 mg via ORAL
  Filled 2012-01-26: qty 1

## 2012-01-26 MED ORDER — ALLOPURINOL 100 MG PO TABS
100.0000 mg | ORAL_TABLET | Freq: Every day | ORAL | Status: DC
  Administered 2012-01-26 – 2012-02-05 (×11): 100 mg via ORAL
  Filled 2012-01-26 (×11): qty 1

## 2012-01-26 MED ORDER — DIGOXIN 125 MCG PO TABS
125.0000 ug | ORAL_TABLET | Freq: Every day | ORAL | Status: DC
  Administered 2012-01-26 – 2012-02-05 (×9): 125 ug via ORAL
  Filled 2012-01-26 (×11): qty 1

## 2012-01-26 MED ORDER — HEPARIN (PORCINE) IN NACL 100-0.45 UNIT/ML-% IJ SOLN
2050.0000 [IU]/h | INTRAMUSCULAR | Status: DC
  Administered 2012-01-26 – 2012-01-27 (×4): 2000 [IU]/h via INTRAVENOUS
  Administered 2012-01-28 – 2012-01-29 (×3): 2150 [IU]/h via INTRAVENOUS
  Administered 2012-01-30: 2050 [IU]/h via INTRAVENOUS
  Administered 2012-01-30: 2150 [IU]/h via INTRAVENOUS
  Administered 2012-01-30 – 2012-02-01 (×2): 2050 [IU]/h via INTRAVENOUS
  Filled 2012-01-26 (×14): qty 250

## 2012-01-26 MED ORDER — AMIODARONE HCL 200 MG PO TABS: 200.0000 mg | ORAL_TABLET | Freq: Every day | ORAL | Status: DC

## 2012-01-26 MED ORDER — ALBUTEROL SULFATE HFA 108 (90 BASE) MCG/ACT IN AERS
2.0000 | INHALATION_SPRAY | Freq: Four times a day (QID) | RESPIRATORY_TRACT | Status: DC | PRN
  Filled 2012-01-26: qty 6.7

## 2012-01-26 MED ORDER — WARFARIN SODIUM 7.5 MG PO TABS
7.5000 mg | ORAL_TABLET | Freq: Once | ORAL | Status: AC
  Administered 2012-01-26: 7.5 mg via ORAL
  Filled 2012-01-26: qty 1

## 2012-01-26 MED ORDER — FUROSEMIDE 40 MG PO TABS: 40.0000 mg | ORAL_TABLET | Freq: Two times a day (BID) | ORAL | Status: DC

## 2012-01-26 MED ORDER — WARFARIN SODIUM 5 MG PO TABS: 5.0000 mg | ORAL_TABLET | Freq: Every day | ORAL | Status: DC

## 2012-01-26 MED ORDER — PANTOPRAZOLE SODIUM 40 MG PO TBEC: 40.0000 mg | DELAYED_RELEASE_TABLET | Freq: Every day | ORAL | Status: DC

## 2012-01-26 MED ORDER — SIMVASTATIN 20 MG PO TABS
20.0000 mg | ORAL_TABLET | Freq: Every day | ORAL | Status: DC
  Administered 2012-01-26 – 2012-02-05 (×11): 20 mg via ORAL
  Filled 2012-01-26 (×11): qty 1

## 2012-01-26 MED ORDER — FUROSEMIDE 10 MG/ML IJ SOLN
60.0000 mg | Freq: Three times a day (TID) | INTRAMUSCULAR | Status: DC
  Filled 2012-01-26 (×2): qty 6

## 2012-01-26 MED ORDER — FUROSEMIDE 10 MG/ML IJ SOLN
40.0000 mg | Freq: Three times a day (TID) | INTRAMUSCULAR | Status: DC
  Administered 2012-01-26 – 2012-01-27 (×3): 40 mg via INTRAVENOUS
  Filled 2012-01-26 (×5): qty 4

## 2012-01-26 MED ORDER — FLUTICASONE PROPIONATE HFA 220 MCG/ACT IN AERO
2.0000 | INHALATION_SPRAY | Freq: Three times a day (TID) | RESPIRATORY_TRACT | Status: DC
  Administered 2012-01-26 – 2012-02-02 (×17): 2 via RESPIRATORY_TRACT
  Filled 2012-01-26: qty 12

## 2012-01-26 NOTE — Progress Notes (Signed)
2341 Patient told RT that she was unsure if she wanted to wear cpap tonight. Patient is to have nurse to call RT if she wants to wear cpap. RT will continue to monitor.

## 2012-01-26 NOTE — Progress Notes (Signed)
Arrived to floor via WC in NAD. Walked from [facility] to bed. Oriented to room and surroundings. Tele box on. No concerns or needs voiced at this time.

## 2012-01-26 NOTE — Progress Notes (Signed)
ANTICOAGULATION CONSULT NOTE - Follow Up Consult  Pharmacy Consult for heparin Indication: atrial fibrillation  Labs:  Basename 01/26/12 2014 01/26/12 1500 01/26/12 0440 01/25/12 0410 01/24/12 0450  HGB -- -- -- 8.7* 8.4*  HCT -- -- -- 28.9* 28.1*  PLT -- -- -- 278 272  APTT -- -- -- -- --  LABPROT -- -- 17.5* 20.6* 23.9*  INR -- -- 1.41 1.73* 2.10*  HEPARINUNFRC 0.52 0.37 0.16* -- --  CREATININE -- -- 2.50* 2.35* 2.39*  CKTOTAL -- -- -- -- --  CKMB -- -- -- -- --  TROPONINI -- -- -- -- --   Assessment: 60yo female subtherapeutic on heparin for Afib. Heparin now therapeutic. Goal of Therapy:  Heparin level 0.3-0.7 units/ml   Plan:  Cont heparin at 2000 units/hr Daily heparin level and CBC.  Gardner Candle PharmD BCPS 01/26/2012,9:44 PM

## 2012-01-26 NOTE — Progress Notes (Signed)
ANTICOAGULATION CONSULT NOTE - Follow Up Consult  Pharmacy Consult for heparin Indication: atrial fibrillation  Labs:  Basename 01/26/12 1500 01/26/12 0440 01/25/12 2000 01/25/12 0410 01/24/12 0450  HGB -- -- -- 8.7* 8.4*  HCT -- -- -- 28.9* 28.1*  PLT -- -- -- 278 272  APTT -- -- -- -- --  LABPROT -- 17.5* -- 20.6* 23.9*  INR -- 1.41 -- 1.73* 2.10*  HEPARINUNFRC 0.37 0.16* 0.14* -- --  CREATININE -- 2.50* -- 2.35* 2.39*  CKTOTAL -- -- -- -- --  CKMB -- -- -- -- --  TROPONINI -- -- -- -- --   Assessment: 61yo female subtherapeutic on heparin for Afib. Heparin now therapeutic. Goal of Therapy:  Heparin level 0.3-0.7 units/ml   Plan:  Cont heparin at 2000 units/hr Recheck 6 hr heparin level Ulyses Southward Johnson PharmD BCPS 01/26/2012,4:02 PM

## 2012-01-26 NOTE — Progress Notes (Signed)
Subjective:  Feeling better. No chest pain. Asking for sleeping pill.  Objective:  Vital Signs in the last 24 hours: Temp:  [97.3 F (36.3 C)-98.6 F (37 C)] 98.4 F (36.9 C) (02/17 0800) Pulse Rate:  [59-72] 66  (02/17 1113) Cardiac Rhythm:  [-] Normal sinus rhythm (02/17 0800) Resp:  [21-26] 22  (02/17 0337) BP: (163-186)/(52-68) 173/52 mmHg (02/17 1113) SpO2:  [94 %-100 %] 96 % (02/17 0947) FiO2 (%):  [3 %] 3 % (02/17 0306) Weight:  [127.053 kg (280 lb 1.6 oz)] 127.053 kg (280 lb 1.6 oz) (02/17 0434)  Physical Exam: BP Readings from Last 1 Encounters:  01/26/12 173/52    Wt Readings from Last 1 Encounters:  01/26/12 127.053 kg (280 lb 1.6 oz)    Weight change: -1.347 kg (-2 lb 15.5 oz)  HEENT: [place]/AT, Eyes-Brown, PERL, EOMI, Conjunctiva-Pale pink, Sclera-Non-icteric Neck: No JVD, No bruit, Trachea midline. Lungs:  Clear, Bilateral. Cardiac:  Regular rhythm, normal S1 and S2, no S3.  Abdomen:  Soft, non-tender. Extremities:  No edema present. No cyanosis. No clubbing. CNS: AxOx3, Cranial nerves grossly intact, moves all 4 extremities. Right handed. Skin: Warm and dry. Chronic venous stasis dematitis, both lower extremities.   Intake/Output from previous day: 02/16 0701 - 02/17 0700 In: 287.7 [I.V.:269.7; IV Piggyback:18] Out: 2400 [Urine:2400]    Lab Results: BMET    Component Value Date/Time   NA 143 01/26/2012 0440   K 4.0 01/26/2012 0440   CL 103 01/26/2012 0440   CO2 31 01/26/2012 0440   GLUCOSE 153* 01/26/2012 0440   BUN 38* 01/26/2012 0440   CREATININE 2.50* 01/26/2012 0440   CREATININE 1.56* 09/25/2011 1058   CALCIUM 8.9 01/26/2012 0440   GFRNONAA 20* 01/26/2012 0440   GFRAA 23* 01/26/2012 0440   CBC    Component Value Date/Time   WBC 8.8 01/25/2012 0410   RBC 3.35* 01/25/2012 0410   HGB 8.7* 01/25/2012 0410   HCT 28.9* 01/25/2012 0410   PLT 278 01/25/2012 0410   MCV 86.3 01/25/2012 0410   MCH 26.0 01/25/2012 0410   MCHC 30.1 01/25/2012 0410   RDW 20.6*  01/25/2012 0410   LYMPHSABS 1.8 01/21/2012 1544   MONOABS 0.5 01/21/2012 1544   EOSABS 0.2 01/21/2012 1544   BASOSABS 0.1 01/21/2012 1544   CARDIAC ENZYMES Lab Results  Component Value Date   CKTOTAL 56 01/23/2012   CKMB 2.3 01/23/2012   TROPONINI 0.73* 01/23/2012    Assessment/Plan:  Patient Active Hospital Problem List:  Status post very small non-Q-wave myocardial infarction with elevated troponin I.  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 fib flutter status post TEE cardioversion in the past  History of septic prosthetic left knee  History of psoriasis  Anemia of chronic disease  Stage IV chronic kidney disease  Morbid obesity  Hypercholesteremia   Plan  Transfer to Tele bed. Resume coumadin Increase activity Decrease Lasix dose.   LOS: 5 days    Orpah Cobb  MD  01/26/2012, 11:47 AM

## 2012-01-26 NOTE — Progress Notes (Signed)
ANTICOAGULATION CONSULT NOTE - Follow Up Consult  Pharmacy Consult for coumadin Indication: atrial fibrillation  Labs:  Basename 01/26/12 0440 01/25/12 2000 01/25/12 0410 01/24/12 0450  HGB -- -- 8.7* 8.4*  HCT -- -- 28.9* 28.1*  PLT -- -- 278 272  APTT -- -- -- --  LABPROT 17.5* -- 20.6* 23.9*  INR 1.41 -- 1.73* 2.10*  HEPARINUNFRC 0.16* 0.14* -- --  CREATININE 2.50* -- 2.35* 2.39*  CKTOTAL -- -- -- --  CKMB -- -- -- --  TROPONINI -- -- -- --   Assessment: 60yo female subtherapeutic on heparin for Afib. Coumadin ok to be resumed today per Dr. Algie Coffer  Goal of Therapy:  Heparin level 0.3-0.7 units/ml   Plan:  Coumadin 7.5mg  x1. Daily INR [place] PharmD BCPS 01/26/2012,12:50 PM

## 2012-01-26 NOTE — Progress Notes (Signed)
Subjective:   Alert, less SOB.  Good diuresis on high dose lasix, I/O yest were 280/2400.  Up in chair, no c/o's.   Objective Vital signs in last 24 hours: Filed Vitals:   01/26/12 0947 01/26/12 1113 01/26/12 1311 01/26/12 1350  BP:  173/52 157/61   Pulse:  66 59   Temp:  98.2 F (36.8 C) 98.1 F (36.7 C)   TempSrc:  Oral Oral   Resp:  19 20   Height:      Weight:      SpO2: 96% 96%  96%   Weight change: -1.347 kg (-2 lb 15.5 oz)  Intake/Output Summary (Last 24 hours) at 01/26/12 1405 Last data filed at 01/26/12 1200  Gross per 24 hour  Intake 387.74 ml  Output   2400 ml  Net -2012.26 ml   Labs: Basic Metabolic Panel:  Lab 01/26/12 1610 01/25/12 0410 01/24/12 0450 01/23/12 0500 01/22/12 0500 01/21/12 1633 01/21/12 1544  NA 143 144 142 143 142 144 138  K 4.0 3.8 3.8 4.2 4.0 3.8 6.0*  CL 103 106 106 109 109 110 104  CO2 31 26 27 26 23  -- 20  GLUCOSE 153* 132* 133* 146* 119* 360* 320*  BUN 38* 35* 30* 30* 29* 25* 22  CREATININE 2.50* 2.35* 2.39* 2.56* 2.19* 1.50* 1.54*  ALB -- -- -- -- -- -- --  CALCIUM 8.9 8.8 8.9 8.6 8.2* -- 9.3  PHOS 4.9* -- 3.5 4.3 3.9 -- --   Liver Function Tests:  Lab 01/26/12 0440 01/21/12 1544  AST -- 50*  ALT -- 15  ALKPHOS -- 96  BILITOT -- 0.3  PROT -- 8.2  ALBUMIN 2.6* 3.3*   No results found for this basename: LIPASE:3,AMYLASE:3 in the last 168 hours No results found for this basename: AMMONIA:3 in the last 168 hours CBC:  Lab 01/25/12 0410 01/24/12 0450 01/23/12 0500 01/22/12 0500 01/21/12 1544  WBC 8.8 10.3 10.0 13.9* --  NEUTROABS -- -- -- -- 8.0*  HGB 8.7* 8.4* 8.5* 8.2* --  HCT 28.9* 28.1* 28.7* 26.8* --  MCV 86.3 85.7 86.7 86.5 --  PLT 278 272 230 211 --   PT/INR: @labrcntip (inr:5) Cardiac Enzymes:  Lab 01/23/12 0500 01/22/12 2221 01/22/12 1446 01/22/12 0600 01/21/12 2307  CKTOTAL 56 60 80 94 98  CKMB 2.3 2.7 4.4* 6.4* 6.3*  CKMBINDEX -- -- -- -- --  TROPONINI 0.73* 0.95* 1.17* 2.08* 1.96*   CBG:  Lab 01/26/12  1303 01/26/12 0754 01/26/12 0433 01/25/12 2347 01/25/12 1234  GLUCAP 157* 130* 151* 130* 134*    Iron Studies: No results found for this basename: IRON:30,TIBC:30,TRANSFERRIN:30,FERRITIN:30 in the last 168 hours Studies/Results: Nm Myocar Multi W/spect W/wall Motion / Ef  01/25/2012  *RADIOLOGY REPORT*  Clinical data: Atrial fibrillation, hypertension, shortness of breath, coronary artery disease.  NUCLEAR MEDICINE MYOCARDIAL PERFUSION IMAGING NUCLEAR MEDICINE LEFT VENTRICULAR WALL MOTION ANALYSIS NUCLEAR MEDICINE LEFT VENTRICULAR EJECTION FRACTION CALCULATION  Technique: Standard 2 day myocardial SPECT imaging was performed after resting intravenous injection of Tc-61m Myoview. After intravenous infusion of Lexiscan (regadenoson) under supervision of cardiology staff, Myoview was injected intravenously and standard myocardial SPECT imaging was performed. Quantitative gated imaging was also performed to evaluate left ventricular wall motion and estimate left ventricular ejection fraction.  Radiopharmaceutical: 30+30 mCi Tc28m Myoview IV.  Comparison: 05/03/2009  Findings:    The stress SPECT images demonstrate physiologic distribution of radiopharmaceutical. Rest images demonstrate no perfusion defects. The gated stress SPECT images demonstrate normal left ventricular myocardial thickening.  No focal wall motion abnormality is seen. Calculated left ventricular end-diastolic volume , end-systolic volume 57ml, ejection fraction of 60%.  IMPRESSION:  1. Negative for pharmacologic-stress induced ischemia.  2. Left ventricular ejection fraction 60%.  Original Report Authenticated By: Osa Craver, M.D.   Dg Chest Port 1 View  01/25/2012  *RADIOLOGY REPORT*  Clinical Data: Pleural effusion  PORTABLE CHEST - 1 VIEW  Comparison:   the previous day's study  Findings: Interval progression of moderate interstitial and alveolar opacities throughout both lungs, involving apices more than bases.  Moderate  cardiomegaly stable.  No definite effusion.  IMPRESSION:  1.  Worsening bilateral infiltrates or edema.  Original Report Authenticated By: Osa Craver, M.D.   Medications:    . heparin 2,000 Units/hr (01/26/12 0900)  . DISCONTD: heparin 2,000 Units/hr (01/26/12 0800)      . ipratropium  0.5 mg Nebulization Q6H   And  . albuterol  2.5 mg Nebulization Q6H  . amiodarone  200 mg Oral Daily  . aspirin  81 mg Oral Daily  . insulin aspart  0-4 Units Subcutaneous Q4H  . metoprolol tartrate  50 mg Oral BID  . pantoprazole  40 mg Oral Q1200  . spironolactone  25 mg Oral Daily  . warfarin  7.5 mg Oral ONCE-1800  . DISCONTD: albuterol-ipratropium  6 puff Inhalation Q6H  . DISCONTD: azithromycin  500 mg Intravenous Q24H  . DISCONTD: aztreonam  1 g Intravenous Q8H  . DISCONTD: cholestyramine  4 g Oral BID  . DISCONTD: furosemide  60 mg Intravenous TID  . DISCONTD: vancomycin  1,500 mg Intravenous Q24H    I  have reviewed scheduled and prn medications.  Physical Exam:  Blood pressure 157/61, pulse 59, temperature 98.1 F (36.7 C), temperature source Oral, resp. rate 20, height 5\' 5"  (1.651 m), weight 127.053 kg (280 lb 1.6 oz), SpO2 96.00%.  Gen: looks significantly better, not SOB in appearance today Skin: no rash, cyanosis Neck: no JVD, bruits or LAN Chest: right side clear today, crackles L base Heart: regular, no rub or gallop Abdomen: soft, obese Ext: 2+ edema LE's- slightly better today Neuro: alert, Ox3, no focal deficit Heme/Lymph: no bruising or LAN  Impression: 1. Acute on CKD (baseline creat 1.5-2.1) - creat mid 2's in hospital.  Patient volume overloaded significantly with worsening CHF clinically and radiographically and we must continue to diurese in spite of rise in creatinine, which simply reflects a new baseline for this patient. Continue IV lasix. 2. Pulm edema/volume excess/bilat pulm infiltrates- see above 3. HTN emergency- improved 4. Afib on coumadin 5.  Diast CHF- EF 50-55% 2/13 6. VDRF- extubated on 01/22/12 7. + trop- getting Nuclear stress test today 8. Hx psoriasis- methotrexate on hold  Recommend:  Continue IV lasix.  Vinson Moselle  MD [employer] [employer] [phone] pgr    [phone] cell 01/26/2012, 2:05 PM

## 2012-01-26 NOTE — Progress Notes (Signed)
01/26/2012 12:45 PM [name]  [id number]  Report called to RN on receiving unit. VSS. Transferred with belongings to new room. Family informed. Patient transferred to [id number].  Celesta Gentile 2/17/201312:45 PM

## 2012-01-26 NOTE — Progress Notes (Signed)
ANTICOAGULATION CONSULT NOTE - Follow Up Consult  Pharmacy Consult for heparin Indication: atrial fibrillation  Labs:  Basename 01/26/12 0440 01/25/12 2000 01/25/12 0410 01/24/12 0450  HGB -- -- 8.7* 8.4*  HCT -- -- 28.9* 28.1*  PLT -- -- 278 272  APTT -- -- -- --  LABPROT 17.5* -- 20.6* 23.9*  INR 1.41 -- 1.73* 2.10*  HEPARINUNFRC 0.16* 0.14* -- --  CREATININE -- -- 2.35* 2.39*  CKTOTAL -- -- -- --  CKMB -- -- -- --  TROPONINI -- -- -- --   Assessment: 60yo female subtherapeutic on heparin for Afib while Coumadin on hold; was supposed to have heparin gtt increased last pm but was never changed.  Goal of Therapy:  Heparin level 0.3-0.7 units/ml   Plan:  Will increase heparin gtt by 4 units/kg/hr to 2000 units/hr and check level in 8hr.  Colleen Can PharmD BCPS 01/26/2012,6:23 AM

## 2012-01-27 LAB — PROTEIN ELECTROPHORESIS, SERUM
Albumin ELP: 46.8 % — ABNORMAL LOW (ref 55.8–66.1)
Total Protein ELP: 6.5 g/dL (ref 6.0–8.3)

## 2012-01-27 LAB — RENAL FUNCTION PANEL
BUN: 43 mg/dL — ABNORMAL HIGH (ref 6–23)
CO2: 30 mEq/L (ref 19–32)
Calcium: 8.6 mg/dL (ref 8.4–10.5)
Creatinine, Ser: 2.45 mg/dL — ABNORMAL HIGH (ref 0.50–1.10)
GFR calc non Af Amer: 20 mL/min — ABNORMAL LOW (ref >90)

## 2012-01-27 LAB — GLUCOSE, CAPILLARY
Glucose-Capillary: 116 mg/dL — ABNORMAL HIGH (ref 70–99)
Glucose-Capillary: 138 mg/dL — ABNORMAL HIGH (ref 70–99)
Glucose-Capillary: 151 mg/dL — ABNORMAL HIGH (ref 70–99)
Glucose-Capillary: 161 mg/dL — ABNORMAL HIGH (ref 70–99)

## 2012-01-27 LAB — PROTIME-INR
INR: 1.39 (ref 0.00–1.49)
Prothrombin Time: 17.3 seconds — ABNORMAL HIGH (ref 11.6–15.2)

## 2012-01-27 LAB — IRON AND TIBC
Iron: 35 ug/dL — ABNORMAL LOW (ref 42–135)
Saturation Ratios: 11 % — ABNORMAL LOW (ref 20–55)
TIBC: 321 ug/dL (ref 250–470)
UIBC: 286 ug/dL (ref 125–400)

## 2012-01-27 LAB — UIFE/LIGHT CHAINS/TP QN, 24-HR UR
Free Kappa Lt Chains,Ur: 32.7 mg/dL — ABNORMAL HIGH (ref 0.14–2.42)
Free Kappa/Lambda Ratio: 5.61 ratio (ref 2.04–10.37)
Gamma Globulin, Urine: DETECTED — AB
Total Protein, Urine: 182.7 mg/dL

## 2012-01-27 LAB — CBC
Hemoglobin: 8.7 g/dL — ABNORMAL LOW (ref 12.0–15.0)
MCH: 25.9 pg — ABNORMAL LOW (ref 26.0–34.0)
MCHC: 29.6 g/dL — ABNORMAL LOW (ref 30.0–36.0)
MCV: 87.5 fL (ref 78.0–100.0)

## 2012-01-27 LAB — ANA: Anti Nuclear Antibody(ANA): NEGATIVE

## 2012-01-27 LAB — HEPARIN LEVEL (UNFRACTIONATED): Heparin Unfractionated: 0.43 IU/mL (ref 0.30–0.70)

## 2012-01-27 MED ORDER — FUROSEMIDE 80 MG PO TABS
160.0000 mg | ORAL_TABLET | Freq: Two times a day (BID) | ORAL | Status: DC
  Administered 2012-01-27 – 2012-01-28 (×2): 160 mg via ORAL
  Filled 2012-01-27 (×4): qty 2

## 2012-01-27 MED ORDER — WARFARIN SODIUM 7.5 MG PO TABS
7.5000 mg | ORAL_TABLET | Freq: Once | ORAL | Status: AC
  Administered 2012-01-27: 7.5 mg via ORAL
  Filled 2012-01-27: qty 1

## 2012-01-27 MED ORDER — DARBEPOETIN ALFA-POLYSORBATE 200 MCG/0.4ML IJ SOLN
200.0000 ug | INTRAMUSCULAR | Status: DC
  Administered 2012-01-27 – 2012-02-03 (×2): 200 ug via SUBCUTANEOUS
  Filled 2012-01-27 (×3): qty 0.4

## 2012-01-27 NOTE — Progress Notes (Signed)
PT Cancellation Note  Treatment cancelled today due to pt refusing to participate despite encouragement- States "I don't feel like it right now".  Will attempt back later today if time allows.  Excell Seltzer, Glenice Laine 01/27/2012, 11:38 AM [phone]

## 2012-01-27 NOTE — Progress Notes (Signed)
Subjective:  Patient denies any chest pain states her breathing is improved Patient had lexicon Myoview stress test which was negative for ischemia.  Objective:  Vital Signs in the last 24 hours: Temp:  [97.9 F (36.6 C)-98.1 F (36.7 C)] 97.9 F (36.6 C) (02/18 1400) Pulse Rate:  [55-62] 56  (02/18 1400) Resp:  [18-20] 20  (02/18 1400) BP: (143-164)/(67-72) 143/72 mmHg (02/18 1400) SpO2:  [94 %-98 %] 96 % (02/18 1400) Weight:  [126.5 kg (278 lb 14.1 oz)] 126.5 kg (278 lb 14.1 oz) (02/18 0500)  Intake/Output from previous day: 02/17 0701 - 02/18 0700 In: 700 [P.O.:480; I.V.:220] Out: 2875 [Urine:2875] Intake/Output from this shift:    Physical Exam: Neck: no adenopathy, no carotid bruit, no JVD, supple, symmetrical, trachea midline and thyroid not enlarged, symmetric, no tenderness/mass/nodules Lungs: Decreased breath sounds at bases with occasional rhonchi Heart: regular rate and rhythm, S1, S2 normal, no murmur, click, rub or gallop Abdomen: soft, non-tender; bowel sounds normal; no masses,  no organomegaly Extremities: No clubbing cyanosis or trace edema with chronic dermatosis changes noted  Lab Results:  [facility] 01/27/12 0605 01/25/12 0410  WBC 8.3 8.8  HGB 8.7* 8.7*  PLT 237 278    Basename 01/27/12 0605 01/26/12 0440  NA 141 143  K 4.4 4.0  CL 101 103  CO2 30 31  GLUCOSE 134* 153*  BUN 43* 38*  CREATININE 2.45* 2.50*   No results found for this basename: TROPONINI:2,CK,MB:2 in the last 72 hours Hepatic Function Panel  Basename 01/27/12 0605  PROT --  ALBUMIN 2.7*  AST --  ALT --  ALKPHOS --  BILITOT --  BILIDIR --  IBILI --   No results found for this basename: CHOL in the last 72 hours No results found for this basename: PROTIME in the last 72 hours  Imaging: Imaging results have been reviewed and No results found.  Cardiac Studies:  Assessment/Plan:  Status post probable very small non-Q-wave myocardial infarction negative lexicon Myoview  stress test Compensated diastolic heart failure Status post acute respiratory failure Hypertension History of pontine CVA in the past COPD Obstructive sleep apnea/obesity hypoventilation syndrome History of bronchial asthma History of A. fib flutter status post TE cardioversion in the past Septic left knee arthritis History of psoriasis Anemia of chronic disease Chronic kidney disease stage IV Morbid obesity Hypercholesteremia Continue present management Agree with IV heparin degrees and restarting Coumadin per pharmacy  LOS: 6 days    [name] 01/27/2012, 6:39 PM

## 2012-01-27 NOTE — Progress Notes (Signed)
ANTICOAGULATION CONSULT NOTE - Follow Up Consult  Pharmacy Consult for heparin/Coumadin Indication: atrial fibrillation  Labs:  Basename 01/27/12 0605 01/26/12 2014 01/26/12 1500 01/26/12 0440 01/25/12 0410  HGB 8.7* -- -- -- 8.7*  HCT 29.4* -- -- -- 28.9*  PLT 237 -- -- -- 278  APTT -- -- -- -- --  LABPROT 17.3* -- -- 17.5* 20.6*  INR 1.39 -- -- 1.41 1.73*  HEPARINUNFRC 0.43 0.52 0.37 -- --  CREATININE 2.45* -- -- 2.50* 2.35*  CKTOTAL -- -- -- -- --  CKMB -- -- -- -- --  TROPONINI -- -- -- -- --   Assessment: 60yo female subtherapeutic on heparin for Afib. Heparin now therapeutic. INR slowly increasing Goal of Therapy:  Heparin level 0.3-0.7 units/ml   Plan:  Cont heparin at 2000 units/hr Coumadin 7.5 mg po x 1 Daily heparin level and CBC.  Elwin Sleight PharmD  01/27/2012,9:22 AM

## 2012-01-27 NOTE — Progress Notes (Signed)
Subjective:  Patient's course as noted. She continues to have resting dyspnea and significant exertional dyspena. Denies chest pain or diaphoresis. Cardiology and Nephrology following closely. Patient did not participate in physical therapy today.   Allergies  Allergen Reactions  . Daptomycin Other (See Comments)    Elevated CPK  . Morphine And Related Nausea And Vomiting  . Keflex Rash    unknown  . Celecoxib     unknown  . Codeine     unknown  . Fluoxetine Hcl     unknown  . Latex     REACTION: undefined  . Ofloxacin     unknown  . Penicillins     unknown  . Rofecoxib     unknown  . Sulfonamide Derivatives     unknown   Current Facility-Administered Medications  Medication Dose Route Frequency Provider Last Rate Last Dose  . 0.9 %  sodium chloride infusion  250 mL Intravenous PRN Canary Brim, NP      . albuterol (PROVENTIL HFA;VENTOLIN HFA) 108 (90 BASE) MCG/ACT inhaler 2 puff  2 puff Inhalation Q6H PRN Ricki Rodriguez, MD      . ipratropium (ATROVENT) nebulizer solution 0.5 mg  0.5 mg Nebulization Q6H Coralyn Helling, MD   0.5 mg at 01/27/12 1941   And  . albuterol (PROVENTIL) (5 MG/ML) 0.5% nebulizer solution 2.5 mg  2.5 mg Nebulization Q6H Coralyn Helling, MD   2.5 mg at 01/27/12 1941  . allopurinol (ZYLOPRIM) tablet 100 mg  100 mg Oral Daily Ricki Rodriguez, MD   100 mg at 01/27/12 0942  . ALPRAZolam Prudy Feeler) tablet 0.25 mg  0.25 mg Oral TID PRN Ricki Rodriguez, MD   0.25 mg at 01/25/12 2139  . amiodarone (PACERONE) tablet 200 mg  200 mg Oral Daily Robynn Pane, MD   200 mg at 01/27/12 0942  . aspirin chewable tablet 81 mg  81 mg Oral Daily Coralyn Helling, MD   81 mg at 01/27/12 0942  . darbepoetin (ARANESP) injection 200 mcg  200 mcg Subcutaneous Q Mon-1800 Trevor Iha, MD   200 mcg at 01/27/12 1738  . digoxin (LANOXIN) tablet 125 mcg  125 mcg Oral Daily Ricki Rodriguez, MD   125 mcg at 01/27/12 [phone]  . fluticasone (FLOVENT HFA) 220 MCG/ACT inhaler 2 puff  2 puff Inhalation  TID Ricki Rodriguez, MD   2 puff at 01/27/12 2200  . furosemide (LASIX) tablet 160 mg  160 mg Oral BID Trevor Iha, MD   160 mg at 01/27/12 1737  . heparin ADULT infusion 100 units/mL (25000 units/250 mL)  2,000 Units/hr Intravenous Continuous Ricki Rodriguez, MD 20 mL/hr at 01/27/12 2157 2,000 Units/hr at 01/27/12 2157  . hydrALAZINE (APRESOLINE) injection 10 mg  10 mg Intravenous Q4H PRN Priscella Mann, MD   10 mg at 01/24/12 1707  . insulin aspart (novoLOG) injection 0-4 Units  0-4 Units Subcutaneous Q4H Canary Brim, NP   3 Units at 01/27/12 2050  . metoprolol (LOPRESSOR) tablet 50 mg  50 mg Oral BID Demetria Pore, MD   50 mg at 01/27/12 2158  . pantoprazole (PROTONIX) EC tablet 40 mg  40 mg Oral Q1200 Leslye Peer, MD   40 mg at 01/27/12 1252  . simvastatin (ZOCOR) tablet 20 mg  20 mg Oral Daily Ricki Rodriguez, MD   20 mg at 01/27/12 0942  . spironolactone (ALDACTONE) tablet 25 mg  25 mg Oral Daily Priscella Mann, MD  25 mg at 01/27/12 0942  . warfarin (COUMADIN) tablet 7.5 mg  7.5 mg Oral ONCE-1800 Willey Blade, MD   7.5 mg at 01/27/12 1736  . zolpidem (AMBIEN) tablet 5 mg  5 mg Oral QHS PRN Ricki Rodriguez, MD   5 mg at 01/26/12 2254  . DISCONTD: furosemide (LASIX) injection 40 mg  40 mg Intravenous TID Maree Krabbe, MD   40 mg at 01/27/12 0949    Objective: Blood pressure 143/72, pulse 68, temperature 97.9 F (36.6 C), temperature source Oral, resp. rate 20, height 5\' 5"  (1.651 m), weight 278 lb 14.1 oz (126.5 kg), SpO2 97.00%.  Obese black female with resting dyspnea. HEENT:minimal right frontal sinus tenderness. NECK:no posterior cervical nodes. LUNGS:distant breath sounds with faint expiratory wheezes. OZ:HYQMVH S1, S2 without S3. QIO:NGEXB, nontender. MWU:XLKGM pitting edema. Negative homan's. NEURO:non-focal.  Lab results: Results for orders placed during the hospital encounter of 01/21/12 (from the past 48 hour(s))  GLUCOSE, CAPILLARY     Status: Abnormal    Collection Time   01/25/12 11:47 PM      Component Value Range Comment   Glucose-Capillary 130 (*) 70 - 99 (mg/dL)   GLUCOSE, CAPILLARY     Status: Abnormal   Collection Time   01/26/12  4:33 AM      Component Value Range Comment   Glucose-Capillary 151 (*) 70 - 99 (mg/dL)   PROTIME-INR     Status: Abnormal   Collection Time   01/26/12  4:40 AM      Component Value Range Comment   Prothrombin Time 17.5 (*) 11.6 - 15.2 (seconds)    INR 1.41  0.00 - 1.49    HEPARIN LEVEL (UNFRACTIONATED)     Status: Abnormal   Collection Time   01/26/12  4:40 AM      Component Value Range Comment   Heparin Unfractionated 0.16 (*) 0.30 - 0.70 (IU/mL)   RENAL FUNCTION PANEL     Status: Abnormal   Collection Time   01/26/12  4:40 AM      Component Value Range Comment   Sodium 143  135 - 145 (mEq/L)    Potassium 4.0  3.5 - 5.1 (mEq/L)    Chloride 103  96 - 112 (mEq/L)    CO2 31  19 - 32 (mEq/L)    Glucose, Bld 153 (*) 70 - 99 (mg/dL)    BUN 38 (*) 6 - 23 (mg/dL)    Creatinine, Ser 0.10 (*) 0.50 - 1.10 (mg/dL)    Calcium 8.9  8.4 - 10.5 (mg/dL)    Phosphorus 4.9 (*) 2.3 - 4.6 (mg/dL)    Albumin 2.6 (*) 3.5 - 5.2 (g/dL)    GFR calc non Af Amer 20 (*) >90 (mL/min)    GFR calc Af Amer 23 (*) >90 (mL/min)   GLUCOSE, CAPILLARY     Status: Abnormal   Collection Time   01/26/12  7:54 AM      Component Value Range Comment   Glucose-Capillary 130 (*) 70 - 99 (mg/dL)    Comment 1 Notify RN     GLUCOSE, CAPILLARY     Status: Abnormal   Collection Time   01/26/12 12:15 PM      Component Value Range Comment   Glucose-Capillary 163 (*) 70 - 99 (mg/dL)    Comment 1 Notify RN     GLUCOSE, CAPILLARY     Status: Abnormal   Collection Time   01/26/12  1:03 PM      Component Value  Range Comment   Glucose-Capillary 157 (*) 70 - 99 (mg/dL)   HEPARIN LEVEL (UNFRACTIONATED)     Status: Normal   Collection Time   01/26/12  3:00 PM      Component Value Range Comment   Heparin Unfractionated 0.37  0.30 - 0.70 (IU/mL)     GLUCOSE, CAPILLARY     Status: Abnormal   Collection Time   01/26/12  4:22 PM      Component Value Range Comment   Glucose-Capillary 151 (*) 70 - 99 (mg/dL)    Comment 1 Notify RN     HEPARIN LEVEL (UNFRACTIONATED)     Status: Normal   Collection Time   01/26/12  8:14 PM      Component Value Range Comment   Heparin Unfractionated 0.52  0.30 - 0.70 (IU/mL)   GLUCOSE, CAPILLARY     Status: Abnormal   Collection Time   01/26/12  8:16 PM      Component Value Range Comment   Glucose-Capillary 159 (*) 70 - 99 (mg/dL)   GLUCOSE, CAPILLARY     Status: Abnormal   Collection Time   01/26/12 11:37 PM      Component Value Range Comment   Glucose-Capillary 173 (*) 70 - 99 (mg/dL)   GLUCOSE, CAPILLARY     Status: Abnormal   Collection Time   01/27/12  4:12 AM      Component Value Range Comment   Glucose-Capillary 138 (*) 70 - 99 (mg/dL)   PROTIME-INR     Status: Abnormal   Collection Time   01/27/12  6:05 AM      Component Value Range Comment   Prothrombin Time 17.3 (*) 11.6 - 15.2 (seconds)    INR 1.39  0.00 - 1.49    RENAL FUNCTION PANEL     Status: Abnormal   Collection Time   01/27/12  6:05 AM      Component Value Range Comment   Sodium 141  135 - 145 (mEq/L)    Potassium 4.4  3.5 - 5.1 (mEq/L)    Chloride 101  96 - 112 (mEq/L)    CO2 30  19 - 32 (mEq/L)    Glucose, Bld 134 (*) 70 - 99 (mg/dL)    BUN 43 (*) 6 - 23 (mg/dL)    Creatinine, Ser 0.98 (*) 0.50 - 1.10 (mg/dL)    Calcium 8.6  8.4 - 10.5 (mg/dL)    Phosphorus 4.3  2.3 - 4.6 (mg/dL)    Albumin 2.7 (*) 3.5 - 5.2 (g/dL)    GFR calc non Af Amer 20 (*) >90 (mL/min)    GFR calc Af Amer 23 (*) >90 (mL/min)   CBC     Status: Abnormal   Collection Time   01/27/12  6:05 AM      Component Value Range Comment   WBC 8.3  4.0 - 10.5 (K/uL)    RBC 3.36 (*) 3.87 - 5.11 (MIL/uL)    Hemoglobin 8.7 (*) 12.0 - 15.0 (g/dL)    HCT 11.9 (*) 14.7 - 46.0 (%)    MCV 87.5  78.0 - 100.0 (fL)    MCH 25.9 (*) 26.0 - 34.0 (pg)    MCHC 29.6 (*)  30.0 - 36.0 (g/dL)    RDW 82.9 (*) 56.2 - 15.5 (%)    Platelets 237  150 - 400 (K/uL)   HEPARIN LEVEL (UNFRACTIONATED)     Status: Normal   Collection Time   01/27/12  6:05 AM      Component  Value Range Comment   Heparin Unfractionated 0.43  0.30 - 0.70 (IU/mL)   GLUCOSE, CAPILLARY     Status: Abnormal   Collection Time   01/27/12  7:46 AM      Component Value Range Comment   Glucose-Capillary 151 (*) 70 - 99 (mg/dL)   GLUCOSE, CAPILLARY     Status: Abnormal   Collection Time   01/27/12 11:44 AM      Component Value Range Comment   Glucose-Capillary 153 (*) 70 - 99 (mg/dL)    Comment 1 Documented in Chart      Comment 2 Notify RN     GLUCOSE, CAPILLARY     Status: Abnormal   Collection Time   01/27/12  5:03 PM      Component Value Range Comment   Glucose-Capillary 116 (*) 70 - 99 (mg/dL)   GLUCOSE, CAPILLARY     Status: Abnormal   Collection Time   01/27/12  8:17 PM      Component Value Range Comment   Glucose-Capillary 161 (*) 70 - 99 (mg/dL)     Studies/Results: No results found.  Patient Active Problem List  Diagnoses  . HYPERLIPIDEMIA  . OBSTRUCTIVE SLEEP APNEA  . HYPERTENSION  . ATRIAL FIBRILLATION WITH RAPID VENTRICULAR RESPONSE  . Acute combined systolic and diastolic heart failure  . 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 BRONCHITIS, RECURRENT  . ASTHMA  . Obesity hypoventilation syndrome  . Prosthetic joint infection  . Group B streptococcal infection  . Gout  . Yeast infection involving the vagina and surrounding area  . SBO (small bowel obstruction)  . DM (diabetes mellitus)  . Chronic anticoagulation  . Oral candidiasis    Impression: S/P respiratory arrest S/P Acute combined systolic and diastolic heart failure Atrial fibrillation with rapid ventricular response. Renal insufficiency Asthma Obesity with hypoventilation syndrome. CVA/ Stroke Severe deconditioning Diabetes Mellitus   Plan: Continue therapy. Rehab consult. Family has expressed concerns regarding  ability to care for her at home post-discharge.   [name], Rhandi Despain 01/27/2012 10:05 PM

## 2012-01-27 NOTE — Progress Notes (Signed)
Subjective: Interval History: none.  Objective: Vital signs in last 24 hours: Temp:  [97.7 F (36.5 C)-98.2 F (36.8 C)] 97.9 F (36.6 C) (02/18 0500) Pulse Rate:  [55-66] 60  (02/18 0730) Resp:  [18-20] 18  (02/18 0500) BP: (150-173)/(52-71) 152/67 mmHg (02/18 0500) SpO2:  [94 %-99 %] 94 % (02/18 0730) Weight:  [126.5 kg (278 lb 14.1 oz)] 126.5 kg (278 lb 14.1 oz) (02/18 0500) Weight change: -0.553 kg (-1 lb 3.5 oz)  Intake/Output from previous day: 02/17 0701 - 02/18 0700 In: 700 [P.O.:480; I.V.:220] Out: 2875 [Urine:2875] Intake/Output this shift:    General appearance: alert, cooperative and morbidly obese Resp: diminished breath sounds bilaterally and rales bibasilar Cardio: S1, S2 normal and systolic murmur: holosystolic 2/6, blowing at apex GI: obese, pos bs, liver down 6 cm Extremities: edema 2 plus and venous stasis dermatitis noted  Lab Results:  [facility] 01/27/12 0605 01/25/12 0410  WBC 8.3 8.8  HGB 8.7* 8.7*  HCT 29.4* 28.9*  PLT 237 278   BMET:  Basename 01/27/12 0605 01/26/12 0440  NA 141 143  K 4.4 4.0  CL 101 103  CO2 30 31  GLUCOSE 134* 153*  BUN 43* 38*  CREATININE 2.45* 2.50*  CALCIUM 8.6 8.9   No results found for this basename: PTH:2 in the last 72 hours Iron Studies: No results found for this basename: IRON,TIBC,TRANSFERRIN,FERRITIN in the last 72 hours  Studies/Results: No results found.  I have reviewed the patient's current medications.  Assessment/Plan: 1CKD 4 stable.  Will need long term f/u vol xs yet. 2 CHF use po diuretics 3 Anemia eval & tx 4 Obesity neds plan 5 HPTH eval 6 HTN lower vol 7OSA 8 P diuretics po, PTH, epo, mobilize., check Fe    LOS: 6 days   Amandine Covino L 01/27/2012,10:31 AM

## 2012-01-28 LAB — PROTEIN ELECTROPHORESIS, SERUM
Beta 2: 6.1 % (ref 3.2–6.5)
Gamma Globulin: 17.5 % (ref 11.1–18.8)
M-Spike, %: NOT DETECTED g/dL

## 2012-01-28 LAB — CBC
HCT: 28.6 % — ABNORMAL LOW (ref 36.0–46.0)
MCHC: 29.7 g/dL — ABNORMAL LOW (ref 30.0–36.0)
MCV: 88.3 fL (ref 78.0–100.0)
RDW: 20.1 % — ABNORMAL HIGH (ref 11.5–15.5)

## 2012-01-28 LAB — COMPREHENSIVE METABOLIC PANEL
BUN: 48 mg/dL — ABNORMAL HIGH (ref 6–23)
Calcium: 8.8 mg/dL (ref 8.4–10.5)
Creatinine, Ser: 2.31 mg/dL — ABNORMAL HIGH (ref 0.50–1.10)
GFR calc Af Amer: 25 mL/min — ABNORMAL LOW (ref >90)
Glucose, Bld: 148 mg/dL — ABNORMAL HIGH (ref 70–99)
Sodium: 139 mEq/L (ref 135–145)
Total Protein: 7 g/dL (ref 6.0–8.3)

## 2012-01-28 LAB — GLUCOSE, CAPILLARY
Glucose-Capillary: 124 mg/dL — ABNORMAL HIGH (ref 70–99)
Glucose-Capillary: 142 mg/dL — ABNORMAL HIGH (ref 70–99)
Glucose-Capillary: 179 mg/dL — ABNORMAL HIGH (ref 70–99)

## 2012-01-28 LAB — CULTURE, BLOOD (ROUTINE X 2): Culture  Setup Time: 201302130155

## 2012-01-28 LAB — HEPARIN LEVEL (UNFRACTIONATED): Heparin Unfractionated: 0.27 IU/mL — ABNORMAL LOW (ref 0.30–0.70)

## 2012-01-28 MED ORDER — WARFARIN SODIUM 7.5 MG PO TABS
7.5000 mg | ORAL_TABLET | Freq: Once | ORAL | Status: AC
  Administered 2012-01-28: 7.5 mg via ORAL
  Filled 2012-01-28: qty 1

## 2012-01-28 MED ORDER — ALBUTEROL SULFATE (5 MG/ML) 0.5% IN NEBU
2.5000 mg | INHALATION_SOLUTION | Freq: Three times a day (TID) | RESPIRATORY_TRACT | Status: DC
  Administered 2012-01-28 – 2012-02-01 (×13): 2.5 mg via RESPIRATORY_TRACT
  Filled 2012-01-28 (×12): qty 0.5

## 2012-01-28 MED ORDER — ALBUTEROL SULFATE (5 MG/ML) 0.5% IN NEBU
2.5000 mg | INHALATION_SOLUTION | Freq: Four times a day (QID) | RESPIRATORY_TRACT | Status: DC | PRN
  Filled 2012-01-28: qty 0.5

## 2012-01-28 MED ORDER — IPRATROPIUM BROMIDE 0.02 % IN SOLN
0.5000 mg | Freq: Three times a day (TID) | RESPIRATORY_TRACT | Status: DC
  Administered 2012-01-28 – 2012-02-01 (×13): 0.5 mg via RESPIRATORY_TRACT
  Filled 2012-01-28 (×13): qty 2.5

## 2012-01-28 MED ORDER — ALBUTEROL SULFATE (5 MG/ML) 0.5% IN NEBU: 2.5000 mg | INHALATION_SOLUTION | Freq: Four times a day (QID) | RESPIRATORY_TRACT | Status: DC

## 2012-01-28 MED ORDER — FERUMOXYTOL INJECTION 510 MG/17 ML
510.0000 mg | Freq: Once | INTRAVENOUS | Status: AC
  Administered 2012-01-28: 510 mg via INTRAVENOUS
  Filled 2012-01-28: qty 17

## 2012-01-28 MED ORDER — FUROSEMIDE 80 MG PO TABS
160.0000 mg | ORAL_TABLET | Freq: Three times a day (TID) | ORAL | Status: DC
  Administered 2012-01-28 – 2012-01-29 (×3): 160 mg via ORAL
  Filled 2012-01-28 (×5): qty 2

## 2012-01-28 MED ORDER — IPRATROPIUM BROMIDE 0.02 % IN SOLN: 0.5000 mg | Freq: Three times a day (TID) | RESPIRATORY_TRACT | Status: DC

## 2012-01-28 NOTE — Progress Notes (Signed)
Subjective:  Patient was approached earlier regarding rehabilitation at a skilled nursing facility. She reports having a negative experience when she went last despite good physical therapy and occupational therapy. Patient strongly wants to go home at the end of this hospital stay and try physical therapy at home. Her husband who has several medical problems as well does not feel he is able to take care of her at this time.   Allergies  Allergen Reactions  . Daptomycin Other (See Comments)    Elevated CPK  . Morphine And Related Nausea And Vomiting  . Keflex Rash    unknown  . Celecoxib     unknown  . Codeine     unknown  . Fluoxetine Hcl     unknown  . Latex     REACTION: undefined  . Ofloxacin     unknown  . Penicillins     unknown  . Rofecoxib     unknown  . Sulfonamide Derivatives     unknown   Current Facility-Administered Medications  Medication Dose Route Frequency Provider Last Rate Last Dose  . 0.9 %  sodium chloride infusion  250 mL Intravenous PRN Canary Brim, NP      . albuterol (PROVENTIL) (5 MG/ML) 0.5% nebulizer solution 2.5 mg  2.5 mg Nebulization TID Provider Default, MD   2.5 mg at 01/28/12 2030   And  . ipratropium (ATROVENT) nebulizer solution 0.5 mg  0.5 mg Nebulization TID Provider Default, MD   0.5 mg at 01/28/12 2031  . albuterol (PROVENTIL) (5 MG/ML) 0.5% nebulizer solution 2.5 mg  2.5 mg Nebulization Q6H PRN Provider Default, MD      . allopurinol (ZYLOPRIM) tablet 100 mg  100 mg Oral Daily Ricki Rodriguez, MD   100 mg at 01/28/12 0941  . ALPRAZolam Prudy Feeler) tablet 0.25 mg  0.25 mg Oral TID PRN Ricki Rodriguez, MD   0.25 mg at 01/28/12 2204  . amiodarone (PACERONE) tablet 200 mg  200 mg Oral Daily Robynn Pane, MD   200 mg at 01/28/12 0941  . aspirin chewable tablet 81 mg  81 mg Oral Daily Coralyn Helling, MD   81 mg at 01/28/12 0940  . darbepoetin (ARANESP) injection 200 mcg  200 mcg Subcutaneous Q Mon-1800 Trevor Iha, MD   200 mcg at 01/27/12  1738  . digoxin (LANOXIN) tablet 125 mcg  125 mcg Oral Daily Ricki Rodriguez, MD   125 mcg at 01/28/12 0941  . ferumoxytol [facility]) injection 510 mg  510 mg Intravenous Once Trevor Iha, MD   510 mg at 01/28/12 1655  . fluticasone (FLOVENT HFA) 220 MCG/ACT inhaler 2 puff  2 puff Inhalation TID Ricki Rodriguez, MD   2 puff at 01/28/12 2031  . furosemide (LASIX) tablet 160 mg  160 mg Oral TID Trevor Iha, MD   160 mg at 01/28/12 2113  . heparin ADULT infusion 100 units/mL (25000 units/250 mL)  2,150 Units/hr Intravenous Continuous [facility], [facility]ARMD 21.5 mL/hr at 01/28/12 1324 2,150 Units/hr at 01/28/12 1324  . hydrALAZINE (APRESOLINE) injection 10 mg  10 mg Intravenous Q4H PRN Priscella Mann, MD   10 mg at 01/24/12 1707  . insulin aspart (novoLOG) injection 0-4 Units  0-4 Units Subcutaneous Q4H Canary Brim, NP   3 Units at 01/28/12 2109  . metoprolol (LOPRESSOR) tablet 50 mg  50 mg Oral BID Demetria Pore, MD   50 mg at 01/28/12 2113  . pantoprazole (PROTONIX) EC tablet 40  mg  40 mg Oral Q1200 Leslye Peer, MD   40 mg at 01/28/12 1221  . simvastatin (ZOCOR) tablet 20 mg  20 mg Oral Daily Ricki Rodriguez, MD   20 mg at 01/28/12 0940  . spironolactone (ALDACTONE) tablet 25 mg  25 mg Oral Daily Priscella Mann, MD   25 mg at 01/28/12 0941  . warfarin (COUMADIN) tablet 7.5 mg  7.5 mg Oral ONCE-1800 Willey Blade, MD   7.5 mg at 01/28/12 1708  . zolpidem (AMBIEN) tablet 5 mg  5 mg Oral QHS PRN Ricki Rodriguez, MD   5 mg at 01/26/12 2254  . DISCONTD: albuterol (PROVENTIL HFA;VENTOLIN HFA) 108 (90 BASE) MCG/ACT inhaler 2 puff  2 puff Inhalation Q6H PRN Ricki Rodriguez, MD      . DISCONTD: albuterol (PROVENTIL) (5 MG/ML) 0.5% nebulizer solution 2.5 mg  2.5 mg Nebulization Q6H Coralyn Helling, MD   2.5 mg at 01/27/12 1941  . DISCONTD: albuterol (PROVENTIL) (5 MG/ML) 0.5% nebulizer solution 2.5 mg  2.5 mg Nebulization Q6H Provider Default, MD      . DISCONTD: furosemide (LASIX) tablet 160  mg  160 mg Oral BID Trevor Iha, MD   160 mg at 01/28/12 0941  . DISCONTD: ipratropium (ATROVENT) nebulizer solution 0.5 mg  0.5 mg Nebulization Q6H Coralyn Helling, MD   0.5 mg at 01/27/12 1941  . DISCONTD: ipratropium (ATROVENT) nebulizer solution 0.5 mg  0.5 mg Nebulization TID Provider Default, MD        Objective: Blood pressure 178/68, pulse 67, temperature 98.1 F (36.7 C), temperature source Oral, resp. rate 18, height 5\' 5"  (1.651 m), weight 273 lb 5.9 oz (124 kg), SpO2 94.00%.  Well-developed obese black female with less resting dyspnea. Presently with O2 on a day. HEENT:no sinus tenderness. No sclera icterus. NECK:no posterior cervical nodes. LUNGS:clear to auscultation. No vocal fremitus. Presently without wheezes. RU:EAVWUJ S1, S2 without S3. WJX:BJYNW nontender. GNF:AOZHY to 1+ pitting edema. Negative Homans. NEURO:intact.  Lab results: Results for orders placed during the hospital encounter of 01/21/12 (from the past 48 hour(s))  GLUCOSE, CAPILLARY     Status: Abnormal   Collection Time   01/26/12 11:37 PM      Component Value Range Comment   Glucose-Capillary 173 (*) 70 - 99 (mg/dL)   GLUCOSE, CAPILLARY     Status: Abnormal   Collection Time   01/27/12  4:12 AM      Component Value Range Comment   Glucose-Capillary 138 (*) 70 - 99 (mg/dL)   PROTIME-INR     Status: Abnormal   Collection Time   01/27/12  6:05 AM      Component Value Range Comment   Prothrombin Time 17.3 (*) 11.6 - 15.2 (seconds)    INR 1.39  0.00 - 1.49    RENAL FUNCTION PANEL     Status: Abnormal   Collection Time   01/27/12  6:05 AM      Component Value Range Comment   Sodium 141  135 - 145 (mEq/L)    Potassium 4.4  3.5 - 5.1 (mEq/L)    Chloride 101  96 - 112 (mEq/L)    CO2 30  19 - 32 (mEq/L)    Glucose, Bld 134 (*) 70 - 99 (mg/dL)    BUN 43 (*) 6 - 23 (mg/dL)    Creatinine, Ser 8.65 (*) 0.50 - 1.10 (mg/dL)    Calcium 8.6  8.4 - 10.5 (mg/dL)    Phosphorus 4.3  2.3 -  4.6 (mg/dL)     Albumin 2.7 (*) 3.5 - 5.2 (g/dL)    GFR calc non Af Amer 20 (*) >90 (mL/min)    GFR calc Af Amer 23 (*) >90 (mL/min)   CBC     Status: Abnormal   Collection Time   01/27/12  6:05 AM      Component Value Range Comment   WBC 8.3  4.0 - 10.5 (K/uL)    RBC 3.36 (*) 3.87 - 5.11 (MIL/uL)    Hemoglobin 8.7 (*) 12.0 - 15.0 (g/dL)    HCT 16.1 (*) 09.6 - 46.0 (%)    MCV 87.5  78.0 - 100.0 (fL)    MCH 25.9 (*) 26.0 - 34.0 (pg)    MCHC 29.6 (*) 30.0 - 36.0 (g/dL)    RDW 04.5 (*) 40.9 - 15.5 (%)    Platelets 237  150 - 400 (K/uL)   HEPARIN LEVEL (UNFRACTIONATED)     Status: Normal   Collection Time   01/27/12  6:05 AM      Component Value Range Comment   Heparin Unfractionated 0.43  0.30 - 0.70 (IU/mL)   GLUCOSE, CAPILLARY     Status: Abnormal   Collection Time   01/27/12  7:46 AM      Component Value Range Comment   Glucose-Capillary 151 (*) 70 - 99 (mg/dL)   GLUCOSE, CAPILLARY     Status: Abnormal   Collection Time   01/27/12 11:44 AM      Component Value Range Comment   Glucose-Capillary 153 (*) 70 - 99 (mg/dL)    Comment 1 Documented in Chart      Comment 2 Notify RN     IRON AND TIBC     Status: Abnormal   Collection Time   01/27/12  1:11 PM      Component Value Range Comment   Iron 35 (*) 42 - 135 (ug/dL)    TIBC 811  914 - 782 (ug/dL)    Saturation Ratios 11 (*) 20 - 55 (%)    UIBC 286  125 - 400 (ug/dL)   PARATHYROID HORMONE, INTACT (NO CA)     Status: Abnormal   Collection Time   01/27/12  1:11 PM      Component Value Range Comment   PTH 349.8 (*) 14.0 - 72.0 (pg/mL)   GLUCOSE, CAPILLARY     Status: Abnormal   Collection Time   01/27/12  5:03 PM      Component Value Range Comment   Glucose-Capillary 116 (*) 70 - 99 (mg/dL)   GLUCOSE, CAPILLARY     Status: Abnormal   Collection Time   01/27/12  8:17 PM      Component Value Range Comment   Glucose-Capillary 161 (*) 70 - 99 (mg/dL)   GLUCOSE, CAPILLARY     Status: Abnormal   Collection Time   01/27/12 11:37 PM       Component Value Range Comment   Glucose-Capillary 138 (*) 70 - 99 (mg/dL)   GLUCOSE, CAPILLARY     Status: Abnormal   Collection Time   01/28/12  4:12 AM      Component Value Range Comment   Glucose-Capillary 124 (*) 70 - 99 (mg/dL)   PROTIME-INR     Status: Abnormal   Collection Time   01/28/12  6:10 AM      Component Value Range Comment   Prothrombin Time 18.3 (*) 11.6 - 15.2 (seconds)    INR 1.49  0.00 - 1.49  HEPARIN LEVEL (UNFRACTIONATED)     Status: Abnormal   Collection Time   01/28/12  6:10 AM      Component Value Range Comment   Heparin Unfractionated 0.27 (*) 0.30 - 0.70 (IU/mL)   CBC     Status: Abnormal   Collection Time   01/28/12  6:10 AM      Component Value Range Comment   WBC 8.3  4.0 - 10.5 (K/uL)    RBC 3.24 (*) 3.87 - 5.11 (MIL/uL)    Hemoglobin 8.5 (*) 12.0 - 15.0 (g/dL)    HCT 40.9 (*) 81.1 - 46.0 (%)    MCV 88.3  78.0 - 100.0 (fL)    MCH 26.2  26.0 - 34.0 (pg)    MCHC 29.7 (*) 30.0 - 36.0 (g/dL)    RDW 91.4 (*) 78.2 - 15.5 (%)    Platelets 241  150 - 400 (K/uL)   PHOSPHORUS     Status: Normal   Collection Time   01/28/12  6:10 AM      Component Value Range Comment   Phosphorus 4.6  2.3 - 4.6 (mg/dL)   COMPREHENSIVE METABOLIC PANEL     Status: Abnormal   Collection Time   01/28/12  6:10 AM      Component Value Range Comment   Sodium 139  135 - 145 (mEq/L)    Potassium 4.3  3.5 - 5.1 (mEq/L)    Chloride 102  96 - 112 (mEq/L)    CO2 28  19 - 32 (mEq/L)    Glucose, Bld 148 (*) 70 - 99 (mg/dL)    BUN 48 (*) 6 - 23 (mg/dL)    Creatinine, Ser 9.56 (*) 0.50 - 1.10 (mg/dL)    Calcium 8.8  8.4 - 10.5 (mg/dL)    Total Protein 7.0  6.0 - 8.3 (g/dL)    Albumin 2.6 (*) 3.5 - 5.2 (g/dL)    AST 23  0 - 37 (U/L)    ALT 15  0 - 35 (U/L)    Alkaline Phosphatase 78  39 - 117 (U/L)    Total Bilirubin 0.1 (*) 0.3 - 1.2 (mg/dL)    GFR calc non Af Amer 22 (*) >90 (mL/min)    GFR calc Af Amer 25 (*) >90 (mL/min)   GLUCOSE, CAPILLARY     Status: Abnormal   Collection  Time   01/28/12  7:36 AM      Component Value Range Comment   Glucose-Capillary 134 (*) 70 - 99 (mg/dL)   GLUCOSE, CAPILLARY     Status: Abnormal   Collection Time   01/28/12 11:22 AM      Component Value Range Comment   Glucose-Capillary 144 (*) 70 - 99 (mg/dL)   GLUCOSE, CAPILLARY     Status: Abnormal   Collection Time   01/28/12  4:42 PM      Component Value Range Comment   Glucose-Capillary 142 (*) 70 - 99 (mg/dL)   GLUCOSE, CAPILLARY     Status: Abnormal   Collection Time   01/28/12  9:07 PM      Component Value Range Comment   Glucose-Capillary 179 (*) 70 - 99 (mg/dL)     Studies/Results: No results found.  Patient Active Problem List  Diagnoses  . HYPERLIPIDEMIA  . OBSTRUCTIVE SLEEP APNEA  . HYPERTENSION  . ATRIAL FIBRILLATION WITH RAPID VENTRICULAR RESPONSE  . Acute combined systolic and diastolic heart failure  . 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 BRONCHITIS, RECURRENT  . ASTHMA  .  Obesity hypoventilation syndrome  . Prosthetic joint infection  . Group B streptococcal infection  . Gout  . Yeast infection involving the vagina and surrounding area  . SBO (small bowel obstruction)  . DM (diabetes mellitus)  . Chronic anticoagulation  . Oral candidiasis    Impression: Status post respiratory arrest. Combined systolic and diastolic heart failure improving. Review insufficiency. Deconditioning. Patient has thus far refused SNF for rehabilitation. Obesity with hypoventilation. Diabetes mellitus. Anemia of chronic disease. Rule out loss. She would benefit from having a high her blood count.   Plan: Continue her present therapy. We'll continue have PT and OT work with patient, assessing her ability to go home for outpatient therapy. Guaiac stools x2. Transfuse to hemoglobin greater than equal to 10. Followup.    [name], Polina Burmaster 01/28/2012 10:40 PM

## 2012-01-28 NOTE — Progress Notes (Signed)
Subjective: Interval History: has complaints sad about poss NH.  Objective: Vital signs in last 24 hours: Temp:  [97.8 F (36.6 C)-98 F (36.7 C)] 98 F (36.7 C) (02/19 0500) Pulse Rate:  [56-68] 64  (02/19 0940) Resp:  [18-20] 18  (02/19 0500) BP: (143-167)/(66-80) 148/80 mmHg (02/19 0940) SpO2:  [95 %-97 %] 96 % (02/19 0854) Weight:  [124 kg (273 lb 5.9 oz)] 124 kg (273 lb 5.9 oz) (02/19 0700) Weight change: -2.5 kg (-5 lb 8.2 oz)  Intake/Output from previous day: 02/18 0701 - 02/19 0700 In: 720 [P.O.:720] Out: 1200 [Urine:1200] Intake/Output this shift: Total I/O In: 400 [P.O.:400] Out: 1 [Stool:1]  General appearance: alert, cooperative, distracted, morbidly obese and crying Resp: diminished breath sounds bilaterally and decreased expansion Cardio: regular rate and rhythm and systolic murmur: holosystolic 2/6, blowing at apex GI: massive obesity, pos bs, liver down 6 cm Extremities: edema 3 plus  Lab Results:  [facility] 01/28/12 0610 01/27/12 0605  WBC 8.3 8.3  HGB 8.5* 8.7*  HCT 28.6* 29.4*  PLT 241 237   BMET:  Basename 01/28/12 0610 01/27/12 0605  NA 139 141  K 4.3 4.4  CL 102 101  CO2 28 30  GLUCOSE 148* 134*  BUN 48* 43*  CREATININE 2.31* 2.45*  CALCIUM 8.8 8.6   No results found for this basename: PTH:2 in the last 72 hours Iron Studies:  Basename 01/27/12 1311  IRON 35*  TIBC 321  TRANSFERRIN --  FERRITIN --    Studies/Results: No results found.  I have reviewed the patient's current medications.  Assessment/Plan: 1 CKD 4 vol xs, slow diuresis.  But stable.  bp tolerating diuresis 2 Anemia Fe P 3 Morbid obesity 4 OSA 5 HPTH  P labs, ^ lasix, cont spironolactone   LOS: 7 days   Aiesha Leland L 01/28/2012,11:21 AM

## 2012-01-28 NOTE — Progress Notes (Signed)
Physical Therapy Treatment Patient Details Name: [name] MRN: [id number] DOB: 01-Feb-1951 Today's Date: 01/28/2012  PT Assessment/Plan  PT - Assessment/Plan Comments on Treatment Session: Pt able to ambulate ~120' today.  Very motivated to participate but states that she is very upset & did not have a good night due to her husband saying he will not be able to help take care of her at home.  Pt is currently at [facility] level for sit<>stand & ambulation.   PT Frequency: Min 3X/week Follow Up Recommendations: Home health PT;Supervision for mobility/OOB Equipment Recommended: None recommended by PT PT Goals  Acute Rehab PT Goals PT Goal: Sit to Stand - Progress: Progressing toward goal PT Goal: Stand to Sit - Progress: Progressing toward goal PT Goal: Ambulate - Progress: Progressing toward goal  PT Treatment Precautions/Restrictions  Precautions Precautions: Fall Precaution Comments: O2 Restrictions Weight Bearing Restrictions: No Mobility (including Balance) Bed Mobility Bed Mobility: No Transfers Sit to Stand: Other (comment);From chair/3-in-1;With upper extremity assist;With armrests (Min Guard (A)) Sit to Stand Details (indicate cue type and reason): Cues for hand placement.   Stand to Sit: To chair/3-in-1;With upper extremity assist;With armrests;Other (comment) (Min Guard (A)) Stand to Sit Details: Cues for hand placement & use of UE's to control descent.   Ambulation/Gait Ambulation/Gait: Yes Ambulation/Gait Assistance: 5: Supervision Ambulation/Gait Assistance Details (indicate cue type and reason): Cues for upright posture & to look up to increase awareness of environment.   Ambulation Distance (Feet): 120 Feet Assistive device: Rolling walker Gait Pattern: Step-through pattern;Decreased stride length;Trunk flexed Stairs: No Wheelchair Mobility Wheelchair Mobility: No  Posture/Postural Control Posture/Postural Control: No significant limitations Exercise      End of Session PT - End of Session Equipment Utilized During Treatment: Gait belt Activity Tolerance: Patient tolerated treatment well Patient left: in chair;with call bell in reach;with family/visitor present Nurse Communication: Mobility status for transfers;Mobility status for ambulation General Behavior During Session: [facility] for tasks performed Cognition: [facility] for tasks performed  Lara Mulch 01/28/2012, 1:44 PM [phone]

## 2012-01-28 NOTE — Progress Notes (Signed)
Placed pt. On CPAP auto titrate via nasal mask with 2L O2 bled in. Pt. Is tolerating CPAP well at this time.  

## 2012-01-28 NOTE — Progress Notes (Signed)
Subjective:  Patient denies any chest pain or shortness of breath patient wanted to go home and refusing for skilled nursing facility or rehabilitation  Objective:  Vital Signs in the last 24 hours: Temp:  [97.8 F (36.6 C)-98 F (36.7 C)] 97.9 F (36.6 C) (02/19 1400) Pulse Rate:  [52-68] 52  (02/19 1400) Resp:  [18-20] 20  (02/19 1400) BP: (147-167)/(61-80) 147/61 mmHg (02/19 1400) SpO2:  [93 %-97 %] 93 % (02/19 1437) Weight:  [124 kg (273 lb 5.9 oz)] 124 kg (273 lb 5.9 oz) (02/19 0700)  Intake/Output from previous day: 02/18 0701 - 02/19 0700 In: 720 [P.O.:720] Out: 1200 [Urine:1200] Intake/Output from this shift:    Physical Exam: Neck: no adenopathy, no carotid bruit, no JVD, supple, symmetrical, trachea midline and thyroid not enlarged, symmetric, no tenderness/mass/nodules Lungs: Decreased breath sounds at bases Heart: regular rate and rhythm, S1, S2 normal and Soft systolic murmur noted Abdomen: soft, non-tender; bowel sounds normal; no masses,  no organomegaly Extremities: No clubbing cyanosis 1+ edema with chronic dermatosis  Lab Results:  Basename 01/28/12 0610 01/27/12 0605  WBC 8.3 8.3  HGB 8.5* 8.7*  PLT 241 237    Basename 01/28/12 0610 01/27/12 0605  NA 139 141  K 4.3 4.4  CL 102 101  CO2 28 30  GLUCOSE 148* 134*  BUN 48* 43*  CREATININE 2.31* 2.45*   No results found for this basename: TROPONINI:2,CK,MB:2 in the last 72 hours Hepatic Function Panel  Basename 01/28/12 0610  PROT 7.0  ALBUMIN 2.6*  AST 23  ALT 15  ALKPHOS 78  BILITOT 0.1*  BILIDIR --  IBILI --   No results found for this basename: CHOL in the last 72 hours No results found for this basename: PROTIME in the last 72 hours  Imaging: Imaging results have been reviewed and No results found.  Cardiac Studies:  Assessment/Plan:  Status post very small non-Q-wave myocardial infarction Negative lexicon Myoview stress test Status post diastolic congestive heart  failure Status post acute respiratory failure Hypertension History of pontine CVA Status post A. fib flutter is status post TEE cardioversion in them past Prosthetic left knee septic arthritis Morbid obesity Anemia of chronic disease Chronic kidney disease stage IV Plan Continue IV heparin and Coumadin per pharmacy protocol Okay to discharge home once INR above 2  LOS: 7 days    [name] N 01/28/2012, 7:19 PM

## 2012-01-28 NOTE — Progress Notes (Signed)
Patient expressed anxiety and apprehension about returning to a skilled nursing facility. She became very tearful and claimed that she could not sleep. Patient was given PRN anxiety medication. Will continue to monitor. Noted for MD follow up.  Harless Litten, RN 01/28/12

## 2012-01-28 NOTE — Progress Notes (Addendum)
ANTICOAGULATION CONSULT NOTE - Follow Up Consult  Pharmacy Consult for Heparin Indication: atrial fibrillation  Labs:  Basename 01/28/12 0610 01/27/12 0605 01/26/12 2014 01/26/12 0440  HGB 8.5* 8.7* -- --  HCT 28.6* 29.4* -- --  PLT 241 237 -- --  APTT -- -- -- --  LABPROT 18.3* 17.3* -- 17.5*  INR 1.49 1.39 -- 1.41  HEPARINUNFRC 0.27* 0.43 0.52 --  CREATININE 2.31* 2.45* -- 2.50*  CKTOTAL -- -- -- --  CKMB -- -- -- --  TROPONINI -- -- -- --   Assessment: 61yo female on heparin for Afib. Heparin level is subtherapeutic. No bleeding noted but H/H is low.[name]   Spoke with RN and no problems with heparin infusing..  Goal of Therapy:  Heparin level 0.3-0.7 units/ml   Plan:  1. Increase heparin to 2150 units/hr 2. Daily heparin level and CBC  Milton, Maryanna Shape PharmD BCPS 01/28/2012,7:58 AM   INR = 1.49, slowly trending up  Plan: 1) Coumadin 7.5 mg po x 1 today 2) Follow up in AM

## 2012-01-29 LAB — GLUCOSE, CAPILLARY
Glucose-Capillary: 147 mg/dL — ABNORMAL HIGH (ref 70–99)
Glucose-Capillary: 145 mg/dL — ABNORMAL HIGH (ref 70–99)
Glucose-Capillary: 170 mg/dL — ABNORMAL HIGH (ref 70–99)

## 2012-01-29 LAB — RENAL FUNCTION PANEL
BUN: 49 mg/dL — ABNORMAL HIGH (ref 6–23)
CO2: 32 mEq/L (ref 19–32)
Chloride: 101 mEq/L (ref 96–112)
Creatinine, Ser: 2.37 mg/dL — ABNORMAL HIGH (ref 0.50–1.10)
GFR calc non Af Amer: 21 mL/min — ABNORMAL LOW (ref >90)
Potassium: 4.2 mEq/L (ref 3.5–5.1)

## 2012-01-29 LAB — CBC
HCT: 30.6 % — ABNORMAL LOW (ref 36.0–46.0)
MCV: 89.2 fL (ref 78.0–100.0)
Platelets: 252 10*3/uL (ref 150–400)
RBC: 3.43 MIL/uL — ABNORMAL LOW (ref 3.87–5.11)
WBC: 7.9 10*3/uL (ref 4.0–10.5)

## 2012-01-29 MED ORDER — WARFARIN SODIUM 7.5 MG PO TABS
7.5000 mg | ORAL_TABLET | Freq: Once | ORAL | Status: AC
  Administered 2012-01-29: 7.5 mg via ORAL
  Filled 2012-01-29: qty 1

## 2012-01-29 MED ORDER — METOPROLOL TARTRATE 25 MG PO TABS
25.0000 mg | ORAL_TABLET | Freq: Two times a day (BID) | ORAL | Status: DC
  Administered 2012-01-29 – 2012-02-02 (×6): 25 mg via ORAL
  Filled 2012-01-29 (×9): qty 1

## 2012-01-29 MED ORDER — FUROSEMIDE 80 MG PO TABS
160.0000 mg | ORAL_TABLET | Freq: Two times a day (BID) | ORAL | Status: DC
  Administered 2012-01-29 – 2012-02-05 (×15): 160 mg via ORAL
  Filled 2012-01-29 (×16): qty 2

## 2012-01-29 MED ORDER — CALCITRIOL 0.25 MCG PO CAPS
0.2500 ug | ORAL_CAPSULE | Freq: Every day | ORAL | Status: DC
  Administered 2012-01-29 – 2012-02-05 (×8): 0.25 ug via ORAL
  Filled 2012-01-29 (×9): qty 1

## 2012-01-29 NOTE — Progress Notes (Signed)
Subjective:  Patient reports she's feeling better overall. She is trying harder with physical therapy with attempts at ambulation. She is presently refusing to go to skilled nursing facility for rehabilitation. She is pressing her family to allow her to come back home for home physical therapy. She denies chest pains. No worsening shortness of breath. No other new complaints.   Allergies  Allergen Reactions  . Daptomycin Other (See Comments)    Elevated CPK  . Morphine And Related Nausea And Vomiting  . Keflex Rash    unknown  . Celecoxib     unknown  . Codeine     unknown  . Fluoxetine Hcl     unknown  . Latex     REACTION: undefined  . Ofloxacin     unknown  . Penicillins     unknown  . Rofecoxib     unknown  . Sulfonamide Derivatives     unknown   Current Facility-Administered Medications  Medication Dose Route Frequency Provider Last Rate Last Dose  . 0.9 %  sodium chloride infusion  250 mL Intravenous PRN Canary Brim, NP      . albuterol (PROVENTIL) (5 MG/ML) 0.5% nebulizer solution 2.5 mg  2.5 mg Nebulization TID Provider Default, MD   2.5 mg at 01/29/12 0850   And  . ipratropium (ATROVENT) nebulizer solution 0.5 mg  0.5 mg Nebulization TID Provider Default, MD   0.5 mg at 01/29/12 0850  . albuterol (PROVENTIL) (5 MG/ML) 0.5% nebulizer solution 2.5 mg  2.5 mg Nebulization Q6H PRN Provider Default, MD      . allopurinol (ZYLOPRIM) tablet 100 mg  100 mg Oral Daily Ricki Rodriguez, MD   100 mg at 01/29/12 1012  . ALPRAZolam Prudy Feeler) tablet 0.25 mg  0.25 mg Oral TID PRN Ricki Rodriguez, MD   0.25 mg at 01/28/12 2204  . amiodarone (PACERONE) tablet 200 mg  200 mg Oral Daily Robynn Pane, MD   200 mg at 01/29/12 1012  . aspirin chewable tablet 81 mg  81 mg Oral Daily Coralyn Helling, MD   81 mg at 01/29/12 1012  . calcitRIOL (ROCALTROL) capsule 0.25 mcg  0.25 mcg Oral Daily Trevor Iha, MD   0.25 mcg at 01/29/12 1249  . darbepoetin (ARANESP) injection 200 mcg  200 mcg  Subcutaneous Q Mon-1800 Trevor Iha, MD   200 mcg at 01/27/12 1738  . digoxin (LANOXIN) tablet 125 mcg  125 mcg Oral Daily Ricki Rodriguez, MD   125 mcg at 01/28/12 0941  . ferumoxytol [facility]) injection 510 mg  510 mg Intravenous Once Trevor Iha, MD   510 mg at 01/28/12 1655  . fluticasone (FLOVENT HFA) 220 MCG/ACT inhaler 2 puff  2 puff Inhalation TID Ricki Rodriguez, MD   2 puff at 01/29/12 0850  . furosemide (LASIX) tablet 160 mg  160 mg Oral BID Trevor Iha, MD      . heparin ADULT infusion 100 units/mL (25000 units/250 mL)  2,150 Units/hr Intravenous Continuous Maryanna Shape [place], PHARMD 21.5 mL/hr at 01/29/12 1144 2,150 Units/hr at 01/29/12 1144  . hydrALAZINE (APRESOLINE) injection 10 mg  10 mg Intravenous Q4H PRN Priscella Mann, MD   10 mg at 01/24/12 1707  . insulin aspart (novoLOG) injection 0-4 Units  0-4 Units Subcutaneous Q4H Canary Brim, NP   1 Units at 01/29/12 1229  . metoprolol tartrate (LOPRESSOR) tablet 25 mg  25 mg Oral BID Robynn Pane, MD      .  pantoprazole (PROTONIX) EC tablet 40 mg  40 mg Oral Q1200 Leslye Peer, MD   40 mg at 01/29/12 1229  . simvastatin (ZOCOR) tablet 20 mg  20 mg Oral Daily Ricki Rodriguez, MD   20 mg at 01/29/12 1012  . spironolactone (ALDACTONE) tablet 25 mg  25 mg Oral Daily Lucianne Muss [place], MD   25 mg at 01/29/12 1012  . warfarin (COUMADIN) tablet 7.5 mg  7.5 mg Oral ONCE-1800 Willey Blade, MD   7.5 mg at 01/28/12 1708  . warfarin (COUMADIN) tablet 7.5 mg  7.5 mg Oral ONCE-1800 Crystal Stillinger Robertson, PHARMD      . zolpidem (AMBIEN) tablet 5 mg  5 mg Oral QHS PRN Ricki Rodriguez, MD   5 mg at 01/26/12 2254  . DISCONTD: furosemide (LASIX) tablet 160 mg  160 mg Oral TID Trevor Iha, MD   160 mg at 01/29/12 1012  . DISCONTD: metoprolol (LOPRESSOR) tablet 50 mg  50 mg Oral BID Demetria Pore, MD   50 mg at 01/28/12 2113    Objective: Blood pressure 164/67, pulse 57, temperature 98.1 F (36.7 C), temperature  source Oral, resp. rate 20, height 5\' 5"  (1.651 m), weight 280 lb 12.8 oz (127.369 kg), SpO2 96.00%.  Obese black female in no acute distress at rest. HEENT:no sinus tenderness. No posterior cervical nodes. NECK:no enlarged thyroid. LUNGS:decreased breath sounds at bases. No wheezes rales or vocal fremitus. ZO:XWRUEA S1, S2 without S3. VWU:JWJXB, nontender. JYN:WGNFAOZ pitting edema. Negative Homans. NEURO:intact without significant change.  Lab results: Results for orders placed during the hospital encounter of 01/21/12 (from the past 48 hour(s))  GLUCOSE, CAPILLARY     Status: Abnormal   Collection Time   01/27/12  5:03 PM      Component Value Range Comment   Glucose-Capillary 116 (*) 70 - 99 (mg/dL)   GLUCOSE, CAPILLARY     Status: Abnormal   Collection Time   01/27/12  8:17 PM      Component Value Range Comment   Glucose-Capillary 161 (*) 70 - 99 (mg/dL)   GLUCOSE, CAPILLARY     Status: Abnormal   Collection Time   01/27/12 11:37 PM      Component Value Range Comment   Glucose-Capillary 138 (*) 70 - 99 (mg/dL)   GLUCOSE, CAPILLARY     Status: Abnormal   Collection Time   01/28/12  4:12 AM      Component Value Range Comment   Glucose-Capillary 124 (*) 70 - 99 (mg/dL)   PROTIME-INR     Status: Abnormal   Collection Time   01/28/12  6:10 AM      Component Value Range Comment   Prothrombin Time 18.3 (*) 11.6 - 15.2 (seconds)    INR 1.49  0.00 - 1.49    HEPARIN LEVEL (UNFRACTIONATED)     Status: Abnormal   Collection Time   01/28/12  6:10 AM      Component Value Range Comment   Heparin Unfractionated 0.27 (*) 0.30 - 0.70 (IU/mL)   CBC     Status: Abnormal   Collection Time   01/28/12  6:10 AM      Component Value Range Comment   WBC 8.3  4.0 - 10.5 (K/uL)    RBC 3.24 (*) 3.87 - 5.11 (MIL/uL)    Hemoglobin 8.5 (*) 12.0 - 15.0 (g/dL)    HCT 30.8 (*) 65.7 - 46.0 (%)    MCV 88.3  78.0 - 100.0 (fL)    MCH  26.2  26.0 - 34.0 (pg)    MCHC 29.7 (*) 30.0 - 36.0 (g/dL)    RDW 40.9  (*) 81.1 - 15.5 (%)    Platelets 241  150 - 400 (K/uL)   PHOSPHORUS     Status: Normal   Collection Time   01/28/12  6:10 AM      Component Value Range Comment   Phosphorus 4.6  2.3 - 4.6 (mg/dL)   COMPREHENSIVE METABOLIC PANEL     Status: Abnormal   Collection Time   01/28/12  6:10 AM      Component Value Range Comment   Sodium 139  135 - 145 (mEq/L)    Potassium 4.3  3.5 - 5.1 (mEq/L)    Chloride 102  96 - 112 (mEq/L)    CO2 28  19 - 32 (mEq/L)    Glucose, Bld 148 (*) 70 - 99 (mg/dL)    BUN 48 (*) 6 - 23 (mg/dL)    Creatinine, Ser 9.14 (*) 0.50 - 1.10 (mg/dL)    Calcium 8.8  8.4 - 10.5 (mg/dL)    Total Protein 7.0  6.0 - 8.3 (g/dL)    Albumin 2.6 (*) 3.5 - 5.2 (g/dL)    AST 23  0 - 37 (U/L)    ALT 15  0 - 35 (U/L)    Alkaline Phosphatase 78  39 - 117 (U/L)    Total Bilirubin 0.1 (*) 0.3 - 1.2 (mg/dL)    GFR calc non Af Amer 22 (*) >90 (mL/min)    GFR calc Af Amer 25 (*) >90 (mL/min)   GLUCOSE, CAPILLARY     Status: Abnormal   Collection Time   01/28/12  7:36 AM      Component Value Range Comment   Glucose-Capillary 134 (*) 70 - 99 (mg/dL)   GLUCOSE, CAPILLARY     Status: Abnormal   Collection Time   01/28/12 11:22 AM      Component Value Range Comment   Glucose-Capillary 144 (*) 70 - 99 (mg/dL)   GLUCOSE, CAPILLARY     Status: Abnormal   Collection Time   01/28/12  4:42 PM      Component Value Range Comment   Glucose-Capillary 142 (*) 70 - 99 (mg/dL)   GLUCOSE, CAPILLARY     Status: Abnormal   Collection Time   01/28/12  9:07 PM      Component Value Range Comment   Glucose-Capillary 179 (*) 70 - 99 (mg/dL)   GLUCOSE, CAPILLARY     Status: Abnormal   Collection Time   01/29/12  1:32 AM      Component Value Range Comment   Glucose-Capillary 128 (*) 70 - 99 (mg/dL)   GLUCOSE, CAPILLARY     Status: Abnormal   Collection Time   01/29/12  4:35 AM      Component Value Range Comment   Glucose-Capillary 147 (*) 70 - 99 (mg/dL)    Comment 1 Notify RN     PROTIME-INR      Status: Abnormal   Collection Time   01/29/12  6:00 AM      Component Value Range Comment   Prothrombin Time 19.3 (*) 11.6 - 15.2 (seconds)    INR 1.60 (*) 0.00 - 1.49    HEPARIN LEVEL (UNFRACTIONATED)     Status: Normal   Collection Time   01/29/12  6:00 AM      Component Value Range Comment   Heparin Unfractionated 0.59  0.30 - 0.70 (IU/mL)   CBC  Status: Abnormal   Collection Time   01/29/12  6:00 AM      Component Value Range Comment   WBC 7.9  4.0 - 10.5 (K/uL)    RBC 3.43 (*) 3.87 - 5.11 (MIL/uL)    Hemoglobin 8.9 (*) 12.0 - 15.0 (g/dL)    HCT 04.5 (*) 40.9 - 46.0 (%)    MCV 89.2  78.0 - 100.0 (fL)    MCH 25.9 (*) 26.0 - 34.0 (pg)    MCHC 29.1 (*) 30.0 - 36.0 (g/dL)    RDW 81.1 (*) 91.4 - 15.5 (%)    Platelets 252  150 - 400 (K/uL)   RENAL FUNCTION PANEL     Status: Abnormal   Collection Time   01/29/12  6:00 AM      Component Value Range Comment   Sodium 141  135 - 145 (mEq/L)    Potassium 4.2  3.5 - 5.1 (mEq/L)    Chloride 101  96 - 112 (mEq/L)    CO2 32  19 - 32 (mEq/L)    Glucose, Bld 134 (*) 70 - 99 (mg/dL)    BUN 49 (*) 6 - 23 (mg/dL)    Creatinine, Ser 7.82 (*) 0.50 - 1.10 (mg/dL)    Calcium 9.4  8.4 - 10.5 (mg/dL)    Phosphorus 4.4  2.3 - 4.6 (mg/dL)    Albumin 2.8 (*) 3.5 - 5.2 (g/dL)    GFR calc non Af Amer 21 (*) >90 (mL/min)    GFR calc Af Amer 24 (*) >90 (mL/min)   GLUCOSE, CAPILLARY     Status: Abnormal   Collection Time   01/29/12  7:44 AM      Component Value Range Comment   Glucose-Capillary 160 (*) 70 - 99 (mg/dL)   GLUCOSE, CAPILLARY     Status: Abnormal   Collection Time   01/29/12 12:10 PM      Component Value Range Comment   Glucose-Capillary 137 (*) 70 - 99 (mg/dL)     Studies/Results: No results found.  Patient Active Problem List  Diagnoses  . HYPERLIPIDEMIA  . OBSTRUCTIVE SLEEP APNEA  . HYPERTENSION  . ATRIAL FIBRILLATION WITH RAPID VENTRICULAR RESPONSE  . Acute combined systolic and diastolic heart failure  . C V A / STROKE    . BRONCHITIS, RECURRENT  . ASTHMA  . Obesity hypoventilation syndrome  . Prosthetic joint infection  . Group B streptococcal infection  . Gout  . Yeast infection involving the vagina and surrounding area  . SBO (small bowel obstruction)  . DM (diabetes mellitus)  . Chronic anticoagulation  . Oral candidiasis    Impression:  Status post respiratory arrest Combined systolic and diastolic heart failure Renal insufficiency Deconditioning with PT in progress. Morbid obesity. Anemia of chronic disease. This is also contributing to her exertional dyspnea. Adjustment reaction with depression. Hypoventilation syndrome  With history of sleep apnea.  Plan: Will plan to transfuse 1 unit of packed RBCs. Continue physical therapy and occupational therapy as tolerated  CBC, CMET in a.m.     [name], [name] 01/29/2012 3:13 PM

## 2012-01-29 NOTE — Progress Notes (Signed)
Clinical Social Worker was reconsulted for "SNF." CSW met with pt to address consult. Please see chart for assessment. Pt declined SNF. MD is aware. CSW is signing off, please reconsult if a need arises prior to dicsharge.   Dede Query, MSW, Theresia Majors [phone]

## 2012-01-29 NOTE — Progress Notes (Signed)
Subjective:  Patient denies any chest pain or shortness of breath Patient wanted to be discharged home refusing for skilled nursing facility at this time  Objective:  Vital Signs in the last 24 hours: Temp:  [98.1 F (36.7 C)-98.4 F (36.9 C)] 98.1 F (36.7 C) (02/20 1333) Pulse Rate:  [53-67] 57  (02/20 1333) Resp:  [18-20] 20  (02/20 1333) BP: (145-178)/(67-88) 145/88 mmHg (02/20 1716) SpO2:  [94 %-97 %] 97 % (02/20 1524) Weight:  [127.369 kg (280 lb 12.8 oz)] 127.369 kg (280 lb 12.8 oz) (02/20 0500)  Intake/Output from previous day: 02/19 0701 - 02/20 0700 In: 1478 [P.O.:1160; I.V.:318] Out: 4003 [Urine:4000; Stool:3] Intake/Output from this shift: Total I/O In: -  Out: 1025 [Urine:1025]  Physical Exam: Neck: no adenopathy, no carotid bruit, no JVD and supple, symmetrical, trachea midline Lungs: clear to auscultation bilaterally Heart: regular rate and rhythm, S1, S2 normal and Soft systolic murmur noted Abdomen: soft, non-tender; bowel sounds normal; no masses,  no organomegaly Extremities: No clubbing cyanosis 1+ edema with chronic dermatosis  Lab Results:  Basename 01/29/12 0600 01/28/12 0610  WBC 7.9 8.3  HGB 8.9* 8.5*  PLT 252 241    Basename 01/29/12 0600 01/28/12 0610  NA 141 139  K 4.2 4.3  CL 101 102  CO2 32 28  GLUCOSE 134* 148*  BUN 49* 48*  CREATININE 2.37* 2.31*   No results found for this basename: TROPONINI:2,CK,MB:2 in the last 72 hours Hepatic Function Panel  Basename 01/29/12 0600 01/28/12 0610  PROT -- 7.0  ALBUMIN 2.8* --  AST -- 23  ALT -- 15  ALKPHOS -- 78  BILITOT -- 0.1*  BILIDIR -- --  IBILI -- --   No results found for this basename: CHOL in the last 72 hours No results found for this basename: PROTIME in the last 72 hours  Imaging: Imaging results have been reviewed and No results found.  Cardiac Studies:  Assessment/Plan:  Status post very small non-Q-wave myocardial infarction stable Status post diastolic  congestive heart failure Status post acute respiratory failure Hypertension History of pontine CVA History of A. fib flutter status post TEE cardioversion in the past History of prosthetic left knee septic arthritis Morbid obesity Anemia of chronic disease Chronic kidney disease stage IV Plan continue present management Check PT/INR in a.m. Increase ambulation  LOS: 8 days    Lequan Dobratz N 01/29/2012, 6:05 PM

## 2012-01-29 NOTE — Progress Notes (Signed)
ANTICOAGULATION CONSULT NOTE - Follow Up Consult  Pharmacy Consult for Heparin/Coumadin Indication: atrial fibrillation  Allergies  Allergen Reactions  . Daptomycin Other (See Comments)    Elevated CPK  . Morphine And Related Nausea And Vomiting  . Keflex Rash    unknown  . Celecoxib     unknown  . Codeine     unknown  . Fluoxetine Hcl     unknown  . Latex     REACTION: undefined  . Ofloxacin     unknown  . Penicillins     unknown  . Rofecoxib     unknown  . Sulfonamide Derivatives     unknown    Patient Measurements: Height: 5\' 5"  (165.1 cm) Weight: 280 lb 12.8 oz (127.369 kg) IBW/kg (Calculated) : 57  Heparin Dosing Weight:    Vital Signs: Temp: 98.4 F (36.9 C) (02/20 0500) Temp src: Oral (02/20 0500) BP: 176/67 mmHg (02/20 0500) Pulse Rate: 53  (02/20 1000)  Labs:  Basename 01/29/12 0600 01/28/12 0610 01/27/12 0605  HGB 8.9* 8.5* --  HCT 30.6* 28.6* 29.4*  PLT 252 241 237  APTT -- -- --  LABPROT 19.3* 18.3* 17.3*  INR 1.60* 1.49 1.39  HEPARINUNFRC 0.59 0.27* 0.43  CREATININE 2.37* 2.31* 2.45*  CKTOTAL -- -- --  CKMB -- -- --  TROPONINI -- -- --   Estimated Creatinine Clearance: 33.5 ml/min (by C-G formula based on Cr of 2.37).  Assessment: 61 yo F admitted with SOB and intubated in ED. Patient recently admitted in December on Coumadin for AFib/hx CVA. Coumadin PTA for Afib and CVA 5mg /d,  Anticoagulation: Heparin level 0.59 and INR 1.6. Hgb up to 8.9. Platelets up to 252.   Goals: Heparin level 0.3-0.7 with INR 1-2  Plan: Heparin at 2150 units/hr to continue Coumadin 7.5mg  x1 again today.  Merilynn Finland, [employer] 01/29/2012,10:32 AM

## 2012-01-29 NOTE — Progress Notes (Signed)
Physical Therapy Treatment Patient Details Name: JO-ANNE KLUTH MRN: [id number] DOB: 1951-01-25 Today's Date: 01/29/2012  PT Assessment/Plan  PT - Assessment/Plan Comments on Treatment Session: Pt agreeable to ambulation and motivated to progress with therapy/mobility.  PT Plan: Discharge plan remains appropriate PT Frequency: Min 3X/week Recommendations for Other Services: OT consult Follow Up Recommendations: Supervision for mobility/OOB;Home health PT Equipment Recommended: None recommended by PT PT Goals  Acute Rehab PT Goals PT Goal: Sit to Stand - Progress: Progressing toward goal PT Goal: Stand to Sit - Progress: Progressing toward goal PT Transfer Goal: Bed to Chair/Chair to Bed - Progress: Progressing toward goal PT Goal: Ambulate - Progress: Progressing toward goal  PT Treatment Precautions/Restrictions  Precautions Precautions: Fall Precaution Comments: O2 Restrictions Weight Bearing Restrictions: No Mobility (including Balance) Bed Mobility Bed Mobility: No Transfers Sit to Stand: From chair/3-in-1;With upper extremity assist;4: Min assist;Other (comment);With armrests (MinGuard A) Sit to Stand Details (indicate cue type and reason): Cues for safe technique. x3.  Stand to Sit: 4: Min assist;Other (comment);With upper extremity assist;With armrests;To chair/3-in-1 (MinGuard A) Stand to Sit Details: Cues for positioning prior to sitting.  Ambulation/Gait Ambulation/Gait Assistance: 5: Supervision Ambulation/Gait Assistance Details (indicate cue type and reason): Cues for RW management and safety with RW Ambulation Distance (Feet): 120 Feet Assistive device: Rolling walker Gait Pattern: Decreased trunk rotation;Trunk flexed;Decreased stride length Gait velocity: decreased but not formally measured    Exercise    End of Session PT - End of Session Equipment Utilized During Treatment: Gait belt Activity Tolerance: Patient tolerated treatment well Patient  left: in chair;with call bell in reach General Behavior During Session: [facility] for tasks performed Cognition: [facility] for tasks performed  Fredrich Birks 01/29/2012, 2:36 PM  01/29/2012 Fredrich Birks PTA [phone] pager [phone] office

## 2012-01-29 NOTE — Progress Notes (Signed)
Subjective: Interval History: has complaints breathing a little labored yest pm.  Objective: Vital signs in last 24 hours: Temp:  [97.9 F (36.6 C)-98.4 F (36.9 C)] 98.4 F (36.9 C) (02/20 0500) Pulse Rate:  [52-67] 53  (02/20 1000) Resp:  [18-20] 20  (02/20 0500) BP: (147-178)/(61-68) 176/67 mmHg (02/20 0500) SpO2:  [93 %-97 %] 95 % (02/20 0500) Weight:  [127.369 kg (280 lb 12.8 oz)] 127.369 kg (280 lb 12.8 oz) (02/20 0500) Weight change: 3.369 kg (7 lb 6.8 oz)  Intake/Output from previous day: 02/19 0701 - 02/20 0700 In: 1478 [P.O.:1160; I.V.:318] Out: 4003 [Urine:4000; Stool:3] Intake/Output this shift:    General appearance: alert, cooperative and morbidly obese Resp: rales bibasilar Cardio: systolic murmur: systolic ejection 2/6, decrescendo at 2nd left intercostal space and frq prematures, backround reg GI: obese, liver down 6 cm, soft, pos bs Extremities: edema 2+  Lab Results:  Basename 01/29/12 0600 01/28/12 0610  WBC 7.9 8.3  HGB 8.9* 8.5*  HCT 30.6* 28.6*  PLT 252 241   BMET:  Basename 01/29/12 0600 01/28/12 0610  NA 141 139  K 4.2 4.3  CL 101 102  CO2 32 28  GLUCOSE 134* 148*  BUN 49* 48*  CREATININE 2.37* 2.31*  CALCIUM 9.4 8.8    Basename 01/27/12 1311  PTH 349.8*   Iron Studies:  Basename 01/27/12 1311  IRON 35*  TIBC 321  TRANSFERRIN --  FERRITIN --    Studies/Results: No results found.  I have reviewed the patient's current medications.  Assessment/Plan: 1 CKD 4 vol improving, cont diuresis but can lower dose. 2 Anemia epo, getting iv fe 3 HPTH start vit D 4 obesity 5 Chf better. 6 DM stable  P epo, fe, vit D, lower Lasix    LOS: 8 days   [name] L 01/29/2012,10:48 AM

## 2012-01-30 LAB — CBC
HCT: 30.9 % — ABNORMAL LOW (ref 36.0–46.0)
Hemoglobin: 9.1 g/dL — ABNORMAL LOW (ref 12.0–15.0)
RDW: 20 % — ABNORMAL HIGH (ref 11.5–15.5)
WBC: 9.6 10*3/uL (ref 4.0–10.5)

## 2012-01-30 LAB — PROTIME-INR: INR: 1.68 — ABNORMAL HIGH (ref 0.00–1.49)

## 2012-01-30 LAB — GLUCOSE, CAPILLARY
Glucose-Capillary: 179 mg/dL — ABNORMAL HIGH (ref 70–99)
Glucose-Capillary: 151 mg/dL — ABNORMAL HIGH (ref 70–99)
Glucose-Capillary: 126 mg/dL — ABNORMAL HIGH (ref 70–99)

## 2012-01-30 LAB — HEPARIN LEVEL (UNFRACTIONATED): Heparin Unfractionated: 0.72 IU/mL — ABNORMAL HIGH (ref 0.30–0.70)

## 2012-01-30 MED ORDER — INSULIN ASPART 100 UNIT/ML ~~LOC~~ SOLN
0.0000 [IU] | Freq: Every day | SUBCUTANEOUS | Status: DC
  Administered 2012-02-02 – 2012-02-04 (×3): 2 [IU] via SUBCUTANEOUS
  Filled 2012-01-30: qty 3

## 2012-01-30 MED ORDER — WARFARIN SODIUM 10 MG PO TABS
10.0000 mg | ORAL_TABLET | Freq: Once | ORAL | Status: AC
  Administered 2012-01-30: 10 mg via ORAL
  Filled 2012-01-30: qty 1

## 2012-01-30 MED ORDER — INSULIN ASPART 100 UNIT/ML ~~LOC~~ SOLN
0.0000 [IU] | Freq: Three times a day (TID) | SUBCUTANEOUS | Status: DC
Start: 2012-01-31 — End: 2012-02-05
  Administered 2012-01-31: 2 [IU] via SUBCUTANEOUS
  Administered 2012-01-31: 1 [IU] via SUBCUTANEOUS
  Administered 2012-01-31 – 2012-02-01 (×3): 2 [IU] via SUBCUTANEOUS
  Administered 2012-02-01: 3 [IU] via SUBCUTANEOUS
  Administered 2012-02-02: 1 [IU] via SUBCUTANEOUS
  Administered 2012-02-02: 3 [IU] via SUBCUTANEOUS
  Administered 2012-02-02 – 2012-02-03 (×2): 2 [IU] via SUBCUTANEOUS
  Administered 2012-02-03 (×2): 3 [IU] via SUBCUTANEOUS
  Administered 2012-02-04 (×2): 2 [IU] via SUBCUTANEOUS
  Administered 2012-02-04: 3 [IU] via SUBCUTANEOUS
  Administered 2012-02-05 (×2): 2 [IU] via SUBCUTANEOUS
  Administered 2012-02-05: 3 [IU] via SUBCUTANEOUS
  Filled 2012-01-30: qty 3

## 2012-01-30 NOTE — Progress Notes (Signed)
Pt. Refused cpap. RN stated pt. Did not want to wear cpap for her as well.

## 2012-01-30 NOTE — Progress Notes (Signed)
Subjective:  Patient denies any chest pain or shortness of breath States has been ambulating with the physical therapist and intend to go home Objective:  Vital Signs in the last 24 hours: Temp:  [97.6 F (36.4 C)-98.1 F (36.7 C)] 98.1 F (36.7 C) (02/21 1500) Pulse Rate:  [51-87] 51  (02/21 1500) Resp:  [18-20] 20  (02/21 1500) BP: (145-188)/(47-97) 188/48 mmHg (02/21 1500) SpO2:  [94 %-100 %] 98 % (02/21 1506)  Intake/Output from previous day: 02/20 0701 - 02/21 0700 In: 378 [I.V.:378] Out: 1025 [Urine:1025] Intake/Output from this shift: Total I/O In: 1360 [P.O.:1360] Out: 200 [Urine:200]  Physical Exam: Neck: no adenopathy, no carotid bruit, no JVD and supple, symmetrical, trachea midline Lungs: clear to auscultation bilaterally Heart: regular rate and rhythm, S1, S2 normal and Soft systolic murmur noted Abdomen: soft, non-tender; bowel sounds normal; no masses,  no organomegaly Extremities: No clubbing cyanosis 1+ edema with chronic dermatosis  Lab Results:  Basename 01/30/12 0635 01/29/12 0600  WBC 9.6 7.9  HGB 9.1* 8.9*  PLT 272 252    Basename 01/29/12 0600 01/28/12 0610  NA 141 139  K 4.2 4.3  CL 101 102  CO2 32 28  GLUCOSE 134* 148*  BUN 49* 48*  CREATININE 2.37* 2.31*   No results found for this basename: TROPONINI:2,CK,MB:2 in the last 72 hours Hepatic Function Panel  Basename 01/29/12 0600 01/28/12 0610  PROT -- 7.0  ALBUMIN 2.8* --  AST -- 23  ALT -- 15  ALKPHOS -- 78  BILITOT -- 0.1*  BILIDIR -- --  IBILI -- --   No results found for this basename: CHOL in the last 72 hours No results found for this basename: PROTIME in the last 72 hours  Imaging: Imaging results have been reviewed and No results found.  Cardiac Studies:  Assessment/Plan:  Status post very small non-Q-wave myocardial infarction stable Status post diastolic congestive heart failure Status post acute respiratory failure Hypertension History of pontine CVA History  of A. 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

## 2012-01-30 NOTE — Progress Notes (Signed)
Subjective:  Patient denies any new complaints. She continues to work with physical therapy. She is able to transfer from chair to bed with minimal assistance. She denied chest pains. Mild dyspnea on exertion. She still motivated to go home versus a rehabilitation facility.   Allergies  Allergen Reactions  . Daptomycin Other (See Comments)    Elevated CPK  . Morphine And Related Nausea And Vomiting  . Keflex Rash    unknown  . Celecoxib     unknown  . Codeine     unknown  . Fluoxetine Hcl     unknown  . Latex     REACTION: undefined  . Ofloxacin     unknown  . Penicillins     unknown  . Rofecoxib     unknown  . Sulfonamide Derivatives     unknown   Current Facility-Administered Medications  Medication Dose Route Frequency Provider Last Rate Last Dose  . 0.9 %  sodium chloride infusion  250 mL Intravenous PRN Canary Brim, NP      . albuterol (PROVENTIL) (5 MG/ML) 0.5% nebulizer solution 2.5 mg  2.5 mg Nebulization TID Provider Default, MD   2.5 mg at 01/30/12 1959   And  . ipratropium (ATROVENT) nebulizer solution 0.5 mg  0.5 mg Nebulization TID Provider Default, MD   0.5 mg at 01/30/12 1959  . albuterol (PROVENTIL) (5 MG/ML) 0.5% nebulizer solution 2.5 mg  2.5 mg Nebulization Q6H PRN Provider Default, MD      . allopurinol (ZYLOPRIM) tablet 100 mg  100 mg Oral Daily Ricki Rodriguez, MD   100 mg at 01/30/12 1005  . ALPRAZolam Prudy Feeler) tablet 0.25 mg  0.25 mg Oral TID PRN Ricki Rodriguez, MD   0.25 mg at 01/28/12 2204  . amiodarone (PACERONE) tablet 200 mg  200 mg Oral Daily Robynn Pane, MD   200 mg at 01/30/12 1005  . aspirin chewable tablet 81 mg  81 mg Oral Daily Coralyn Helling, MD   81 mg at 01/30/12 1005  . calcitRIOL (ROCALTROL) capsule 0.25 mcg  0.25 mcg Oral Daily Trevor Iha, MD   0.25 mcg at 01/30/12 1005  . darbepoetin (ARANESP) injection 200 mcg  200 mcg Subcutaneous Q Mon-1800 Trevor Iha, MD   200 mcg at 01/27/12 1738  . digoxin (LANOXIN) tablet 125  mcg  125 mcg Oral Daily Ricki Rodriguez, MD   125 mcg at 01/30/12 1005  . fluticasone (FLOVENT HFA) 220 MCG/ACT inhaler 2 puff  2 puff Inhalation TID Ricki Rodriguez, MD   2 puff at 01/30/12 2001  . furosemide (LASIX) tablet 160 mg  160 mg Oral BID Trevor Iha, MD   160 mg at 01/30/12 1702  . heparin ADULT infusion 100 units/mL (25000 units/250 mL)  2,050 Units/hr Intravenous Continuous Crystal Kyle, PHARMD 20.5 mL/hr at 01/30/12 2304 2,050 Units/hr at 01/30/12 2304  . hydrALAZINE (APRESOLINE) injection 10 mg  10 mg Intravenous Q4H PRN Priscella Mann, MD   10 mg at 01/24/12 1707  . insulin aspart (novoLOG) injection 0-5 Units  0-5 Units Subcutaneous QHS Willey Blade, MD      . insulin aspart (novoLOG) injection 0-9 Units  0-9 Units Subcutaneous TID WC Willey Blade, MD      . metoprolol tartrate (LOPRESSOR) tablet 25 mg  25 mg Oral BID Robynn Pane, MD   25 mg at 01/30/12 2224  . pantoprazole (PROTONIX) EC tablet 40 mg  40 mg Oral Q1200 Les Pou.  Byrum, MD   40 mg at 01/30/12 1154  . simvastatin (ZOCOR) tablet 20 mg  20 mg Oral Daily Ricki Rodriguez, MD   20 mg at 01/30/12 1005  . spironolactone (ALDACTONE) tablet 25 mg  25 mg Oral Daily Priscella Mann, MD   25 mg at 01/30/12 1005  . warfarin (COUMADIN) tablet 10 mg  10 mg Oral ONCE-1800 Crystal [place], PHARMD   10 mg at 01/30/12 1702  . zolpidem (AMBIEN) tablet 5 mg  5 mg Oral QHS PRN Ricki Rodriguez, MD   5 mg at 01/26/12 2254  . DISCONTD: insulin aspart (novoLOG) injection 0-4 Units  0-4 Units Subcutaneous Q4H Canary Brim, NP   1 Units at 01/30/12 1706    Objective: Blood pressure 147/53, pulse 54, temperature 97.8 F (36.6 C), temperature source Oral, resp. rate 20, height 5\' 5"  (1.651 m), weight 280 lb 12.8 oz (127.369 kg), SpO2 97.00%.  Obese black female in no acute distress. HEENT:no sinus tenderness. NECK:no posterior cervical nodes. LUNGS:clear to auscultation. No vocal fremitus. ZO:XWRUEA S1, S2 without  S3. ABD:no tenderness. VWU:JWJXBJYN Homans. Chronic edema without change. NEURO:intact.  Lab results: Results for orders placed during the hospital encounter of 01/21/12 (from the past 48 hour(s))  GLUCOSE, CAPILLARY     Status: Abnormal   Collection Time   01/29/12  1:32 AM      Component Value Range Comment   Glucose-Capillary 128 (*) 70 - 99 (mg/dL)   GLUCOSE, CAPILLARY     Status: Abnormal   Collection Time   01/29/12  4:35 AM      Component Value Range Comment   Glucose-Capillary 147 (*) 70 - 99 (mg/dL)    Comment 1 Notify RN     PROTIME-INR     Status: Abnormal   Collection Time   01/29/12  6:00 AM      Component Value Range Comment   Prothrombin Time 19.3 (*) 11.6 - 15.2 (seconds)    INR 1.60 (*) 0.00 - 1.49    HEPARIN LEVEL (UNFRACTIONATED)     Status: Normal   Collection Time   01/29/12  6:00 AM      Component Value Range Comment   Heparin Unfractionated 0.59  0.30 - 0.70 (IU/mL)   CBC     Status: Abnormal   Collection Time   01/29/12  6:00 AM      Component Value Range Comment   WBC 7.9  4.0 - 10.5 (K/uL)    RBC 3.43 (*) 3.87 - 5.11 (MIL/uL)    Hemoglobin 8.9 (*) 12.0 - 15.0 (g/dL)    HCT 82.9 (*) 56.2 - 46.0 (%)    MCV 89.2  78.0 - 100.0 (fL)    MCH 25.9 (*) 26.0 - 34.0 (pg)    MCHC 29.1 (*) 30.0 - 36.0 (g/dL)    RDW 13.0 (*) 86.5 - 15.5 (%)    Platelets 252  150 - 400 (K/uL)   RENAL FUNCTION PANEL     Status: Abnormal   Collection Time   01/29/12  6:00 AM      Component Value Range Comment   Sodium 141  135 - 145 (mEq/L)    Potassium 4.2  3.5 - 5.1 (mEq/L)    Chloride 101  96 - 112 (mEq/L)    CO2 32  19 - 32 (mEq/L)    Glucose, Bld 134 (*) 70 - 99 (mg/dL)    BUN 49 (*) 6 - 23 (mg/dL)    Creatinine, Ser 7.84 (*)  0.50 - 1.10 (mg/dL)    Calcium 9.4  8.4 - 10.5 (mg/dL)    Phosphorus 4.4  2.3 - 4.6 (mg/dL)    Albumin 2.8 (*) 3.5 - 5.2 (g/dL)    GFR calc non Af Amer 21 (*) >90 (mL/min)    GFR calc Af Amer 24 (*) >90 (mL/min)   GLUCOSE, CAPILLARY     Status:  Abnormal   Collection Time   01/29/12  7:44 AM      Component Value Range Comment   Glucose-Capillary 160 (*) 70 - 99 (mg/dL)   GLUCOSE, CAPILLARY     Status: Abnormal   Collection Time   01/29/12 12:10 PM      Component Value Range Comment   Glucose-Capillary 137 (*) 70 - 99 (mg/dL)   GLUCOSE, CAPILLARY     Status: Abnormal   Collection Time   01/29/12  4:04 PM      Component Value Range Comment   Glucose-Capillary 167 (*) 70 - 99 (mg/dL)    Comment 1 Notify RN     GLUCOSE, CAPILLARY     Status: Abnormal   Collection Time   01/29/12  8:40 PM      Component Value Range Comment   Glucose-Capillary 145 (*) 70 - 99 (mg/dL)   GLUCOSE, CAPILLARY     Status: Abnormal   Collection Time   01/29/12 11:24 PM      Component Value Range Comment   Glucose-Capillary 170 (*) 70 - 99 (mg/dL)   GLUCOSE, CAPILLARY     Status: Abnormal   Collection Time   01/30/12  4:22 AM      Component Value Range Comment   Glucose-Capillary 179 (*) 70 - 99 (mg/dL)   PROTIME-INR     Status: Abnormal   Collection Time   01/30/12  6:35 AM      Component Value Range Comment   Prothrombin Time 20.1 (*) 11.6 - 15.2 (seconds)    INR 1.68 (*) 0.00 - 1.49    HEPARIN LEVEL (UNFRACTIONATED)     Status: Abnormal   Collection Time   01/30/12  6:35 AM      Component Value Range Comment   Heparin Unfractionated 0.72 (*) 0.30 - 0.70 (IU/mL)   CBC     Status: Abnormal   Collection Time   01/30/12  6:35 AM      Component Value Range Comment   WBC 9.6  4.0 - 10.5 (K/uL)    RBC 3.52 (*) 3.87 - 5.11 (MIL/uL)    Hemoglobin 9.1 (*) 12.0 - 15.0 (g/dL)    HCT 84.6 (*) 96.2 - 46.0 (%)    MCV 87.8  78.0 - 100.0 (fL)    MCH 25.9 (*) 26.0 - 34.0 (pg)    MCHC 29.4 (*) 30.0 - 36.0 (g/dL)    RDW 95.2 (*) 84.1 - 15.5 (%)    Platelets 272  150 - 400 (K/uL)   GLUCOSE, CAPILLARY     Status: Abnormal   Collection Time   01/30/12  7:54 AM      Component Value Range Comment   Glucose-Capillary 143 (*) 70 - 99 (mg/dL)    Comment 1 Notify  RN     GLUCOSE, CAPILLARY     Status: Abnormal   Collection Time   01/30/12 12:01 PM      Component Value Range Comment   Glucose-Capillary 151 (*) 70 - 99 (mg/dL)    Comment 1 Notify RN     GLUCOSE, CAPILLARY  Status: Abnormal   Collection Time   01/30/12  5:03 PM      Component Value Range Comment   Glucose-Capillary 126 (*) 70 - 99 (mg/dL)    Comment 1 Notify RN     GLUCOSE, CAPILLARY     Status: Abnormal   Collection Time   01/30/12  8:37 PM      Component Value Range Comment   Glucose-Capillary 157 (*) 70 - 99 (mg/dL)     Studies/Results: No results found.  Patient Active Problem List  Diagnoses  . HYPERLIPIDEMIA  . OBSTRUCTIVE SLEEP APNEA  . HYPERTENSION  . ATRIAL FIBRILLATION WITH RAPID VENTRICULAR RESPONSE  . Acute combined systolic and diastolic heart failure  . 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 BRONCHITIS, RECURRENT  . ASTHMA  . Obesity hypoventilation syndrome  . Prosthetic joint infection  . Group B streptococcal infection  . Gout  . Yeast infection involving the vagina and surrounding area  . SBO (small bowel obstruction)  . DM (diabetes mellitus)  . Chronic anticoagulation  . Oral candidiasis    Impression: Compensated congestive heart failure Atrial fibrillation on Coumadin therapy. INR not therapeutic as of yet. Morbid obesity Deconditioning Sleep apnea with variable CPAP compliance Renal insufficiency Anemia of chronic disease.Mild increase in hemoglobin.   Plan: Continue PT OT. Progress for discharge as tolerated. Transfuse as indicated.   [name], [name] 01/30/2012 11:30 PM

## 2012-01-30 NOTE — Progress Notes (Signed)
ANTICOAGULATION CONSULT NOTE - Follow Up Consult  Pharmacy Consult for Heparin/Coumadin Indication: atrial fibrillation  Allergies  Allergen Reactions  . Daptomycin Other (See Comments)    Elevated CPK  . Morphine And Related Nausea And Vomiting  . Keflex Rash    unknown  . Celecoxib     unknown  . Codeine     unknown  . Fluoxetine Hcl     unknown  . Latex     REACTION: undefined  . Ofloxacin     unknown  . Penicillins     unknown  . Rofecoxib     unknown  . Sulfonamide Derivatives     unknown    Patient Measurements: Height: 5\' 5"  (165.1 cm) Weight: 280 lb 12.8 oz (127.369 kg) IBW/kg (Calculated) : 57  Heparin Dosing Weight:    Vital Signs: Temp: 98 F (36.7 C) (02/21 0500) Temp src: Oral (02/21 0500) BP: 146/69 mmHg (02/21 0600) Pulse Rate: 53  (02/21 0500)  Labs:  Basename 01/30/12 0635 01/29/12 0600 01/28/12 0610  HGB 9.1* 8.9* --  HCT 30.9* 30.6* 28.6*  PLT 272 252 241  APTT -- -- --  LABPROT 20.1* 19.3* 18.3*  INR 1.68* 1.60* 1.49  HEPARINUNFRC 0.72* 0.59 0.27*  CREATININE -- 2.37* 2.31*  CKTOTAL -- -- --  CKMB -- -- --  TROPONINI -- -- --   Estimated Creatinine Clearance: 33.5 ml/min (by C-G formula based on Cr of 2.37).  Assessment: 61 yo F admitted with SOB and intubated in ED. Patient recently admitted in December on Coumadin for AFib/hx CVA. Coumadin PTA for Afib and CVA 5mg /d,  Anticoagulation: Heparin level 0.72 and INR 1.68. Hgb up to 9.1. Platelets up to 272. Patient complains of being very cold while taking Coumadin   Infectious Disease: S/p 4 days Vancomycin, Aztreonam, and Azithromycin empiric for HCAP. Afebrile. WBC 9.6  Cardiovascular: Afib/HTN/DHF. BP 145/88-183-71 with HR 53-67 .  Meds: ASA 81mg , amiodarone, digoxin, po lasix, Metoprolol, Aldactone, Zocor. Stress test normal.  Endocrinology: CBGs 137-179. SSl.   Gastrointestinal / Nutrition: PPI  Neurology: no pain reported  Nephrology: CKD stage 4. SCr down a little to  2.37  Pulmonary: Acute Respiratory Failure-Intubated in ED 2/12. Extubated 2/13.  Hematology / Oncology: Aranesp for anemia  Goals: Heparin level 0.3-0.7 with INR 1-2  Plan: Lower heparin slightly to 2050 units/hr Coumadin 10mg  po x1 today  Merilynn Finland, [employer] 01/30/2012,8:58 AM

## 2012-01-31 LAB — GLUCOSE, CAPILLARY
Glucose-Capillary: 138 mg/dL — ABNORMAL HIGH (ref 70–99)
Glucose-Capillary: 182 mg/dL — ABNORMAL HIGH (ref 70–99)
Glucose-Capillary: 182 mg/dL — ABNORMAL HIGH (ref 70–99)

## 2012-01-31 LAB — CBC
HCT: 30.3 % — ABNORMAL LOW (ref 36.0–46.0)
Hemoglobin: 8.9 g/dL — ABNORMAL LOW (ref 12.0–15.0)
MCHC: 29.4 g/dL — ABNORMAL LOW (ref 30.0–36.0)
RBC: 3.39 MIL/uL — ABNORMAL LOW (ref 3.87–5.11)

## 2012-01-31 LAB — HEPARIN LEVEL (UNFRACTIONATED): Heparin Unfractionated: 0.45 IU/mL (ref 0.30–0.70)

## 2012-01-31 LAB — PROTIME-INR: INR: 2.02 — ABNORMAL HIGH (ref 0.00–1.49)

## 2012-01-31 MED ORDER — WARFARIN SODIUM 7.5 MG PO TABS
7.5000 mg | ORAL_TABLET | Freq: Once | ORAL | Status: AC
  Administered 2012-01-31: 7.5 mg via ORAL
  Filled 2012-01-31: qty 1

## 2012-01-31 NOTE — Progress Notes (Signed)
Physical Therapy Treatment Patient Details Name: [name] MRN: [id number] DOB: Aug 19, 1951 Today's Date: 01/31/2012  PT Assessment/Plan  PT - Assessment/Plan Comments on Treatment Session: Pt admitted with respiratory distress. Progressing with ambulation but unable to maintain oxygen sats on RA with hall ambulation. Pt encouraged to continue working with IS and flutter and to continue ambulating with nursing over the weekend. Pt did require assist for peri-care after BM. PT Plan: Discharge plan remains appropriate Follow Up Recommendations: Home health PT;Supervision for mobility/OOB PT Goals  Acute Rehab PT Goals PT Goal: Sit to Stand - Progress: Progressing toward goal PT Goal: Stand to Sit - Progress: Progressing toward goal PT Transfer Goal: Bed to Chair/Chair to Bed - Progress: Progressing toward goal Pt will Ambulate: with modified independence;with least restrictive assistive device;>150 feet PT Goal: Ambulate - Progress: Updated due to goal met PT Goal: Up/Down Stairs - Progress: Progressing toward goal  PT Treatment Precautions/Restrictions  Precautions Precautions: Fall Precaution Comments: O2 Restrictions Weight Bearing Restrictions: No Mobility (including Balance) Bed Mobility Bed Mobility: No Transfers Sit to Stand: 5: Supervision;From chair/3-in-1;From toilet;With armrests Sit to Stand Details (indicate cue type and reason): increased time and use of armrests Stand to Sit: To chair/3-in-1;To toilet;With armrests Ambulation/Gait Ambulation/Gait Assistance: 5: Supervision Ambulation/Gait Assistance Details (indicate cue type and reason): wide BOS, cues to step into RW and for breathing technique. In room amb on RA pt able to maintain 90-94 but with hall amb pt dropped to 87% with quick return to 90% once seated. Ambulation Distance (Feet): 160 Feet Assistive device: Rolling walker Gait Pattern: Trunk flexed;Decreased stride length Stairs: No (pt denied  attempting)  Posture/Postural Control Posture/Postural Control: Postural limitations Postural Limitations: flexed trunk Exercise  General Exercises - Lower Extremity Long Arc Quad: AROM;Both;20 reps;Seated Hip Flexion/Marching: AROM;Both;Seated;Other reps (comment) Awilda Bill) End of Session PT - End of Session Activity Tolerance: Patient tolerated treatment well Patient left: in chair;with call bell in reach Nurse Communication: Mobility status for transfers;Mobility status for ambulation General Behavior During Session: [facility] for tasks performed Cognition: [facility] for tasks performed  Delorse Lek 01/31/2012, 3:02 PM Toney Sang, PT [phone]

## 2012-01-31 NOTE — Progress Notes (Signed)
Subjective:  Patient feeling better overall. States she is working with physical therapy and making good progress. PT continues to note exertional dyspnea with patient unable to maintain oxygen sats on room air. She's been encouraged to use incentive spirometer and flutter valve therapy. Patient denies chest pains or palpitations. No other new complaints.   Allergies  Allergen Reactions  . Daptomycin Other (See Comments)    Elevated CPK  . Morphine And Related Nausea And Vomiting  . Keflex Rash    unknown  . Celecoxib     unknown  . Codeine     unknown  . Fluoxetine Hcl     unknown  . Latex     REACTION: undefined  . Ofloxacin     unknown  . Penicillins     unknown  . Rofecoxib     unknown  . Sulfonamide Derivatives     unknown   Current Facility-Administered Medications  Medication Dose Route Frequency Provider Last Rate Last Dose  . 0.9 %  sodium chloride infusion  250 mL Intravenous PRN Canary Brim, NP      . albuterol (PROVENTIL) (5 MG/ML) 0.5% nebulizer solution 2.5 mg  2.5 mg Nebulization TID Provider Default, MD   2.5 mg at 01/31/12 1455   And  . ipratropium (ATROVENT) nebulizer solution 0.5 mg  0.5 mg Nebulization TID Provider Default, MD   0.5 mg at 01/31/12 1455  . albuterol (PROVENTIL) (5 MG/ML) 0.5% nebulizer solution 2.5 mg  2.5 mg Nebulization Q6H PRN Provider Default, MD      . allopurinol (ZYLOPRIM) tablet 100 mg  100 mg Oral Daily Ricki Rodriguez, MD   100 mg at 01/31/12 1004  . ALPRAZolam Prudy Feeler) tablet 0.25 mg  0.25 mg Oral TID PRN Ricki Rodriguez, MD   0.25 mg at 01/28/12 2204  . amiodarone (PACERONE) tablet 200 mg  200 mg Oral Daily Robynn Pane, MD   200 mg at 01/31/12 1004  . aspirin chewable tablet 81 mg  81 mg Oral Daily Coralyn Helling, MD   81 mg at 01/31/12 1004  . calcitRIOL (ROCALTROL) capsule 0.25 mcg  0.25 mcg Oral Daily Trevor Iha, MD   0.25 mcg at 01/31/12 1000  . darbepoetin (ARANESP) injection 200 mcg  200 mcg Subcutaneous Q Mon-1800  Trevor Iha, MD   200 mcg at 01/27/12 1738  . digoxin (LANOXIN) tablet 125 mcg  125 mcg Oral Daily Ricki Rodriguez, MD   125 mcg at 01/31/12 1005  . fluticasone (FLOVENT HFA) 220 MCG/ACT inhaler 2 puff  2 puff Inhalation TID Ricki Rodriguez, MD   2 puff at 01/31/12 1455  . furosemide (LASIX) tablet 160 mg  160 mg Oral BID Trevor Iha, MD   160 mg at 01/31/12 1713  . heparin ADULT infusion 100 units/mL (25000 units/250 mL)  2,050 Units/hr Intravenous Continuous Crystal Santa Claus, PHARMD 20.5 mL/hr at 01/30/12 2304 2,050 Units/hr at 01/30/12 2304  . hydrALAZINE (APRESOLINE) injection 10 mg  10 mg Intravenous Q4H PRN Priscella Mann, MD   10 mg at 01/24/12 1707  . insulin aspart (novoLOG) injection 0-5 Units  0-5 Units Subcutaneous QHS Willey Blade, MD      . insulin aspart (novoLOG) injection 0-9 Units  0-9 Units Subcutaneous TID WC Willey Blade, MD   2 Units at 01/31/12 1712  . metoprolol tartrate (LOPRESSOR) tablet 25 mg  25 mg Oral BID Robynn Pane, MD   25 mg at 01/31/12 1005  .  pantoprazole (PROTONIX) EC tablet 40 mg  40 mg Oral Q1200 Leslye Peer, MD   40 mg at 01/31/12 1209  . simvastatin (ZOCOR) tablet 20 mg  20 mg Oral Daily Ricki Rodriguez, MD   20 mg at 01/31/12 1004  . spironolactone (ALDACTONE) tablet 25 mg  25 mg Oral Daily Priscella Mann, MD   25 mg at 01/31/12 1004  . warfarin (COUMADIN) tablet 7.5 mg  7.5 mg Oral ONCE-1800 Willey Blade, MD   7.5 mg at 01/31/12 1713  . zolpidem (AMBIEN) tablet 5 mg  5 mg Oral QHS PRN Ricki Rodriguez, MD   5 mg at 01/26/12 2254    Objective: Blood pressure 170/53, pulse 57, temperature 98 F (36.7 C), temperature source Oral, resp. rate 18, height 5\' 5"  (1.651 m), weight 280 lb 12.8 oz (127.369 kg), SpO2 98.00%.  Obese black female sitting up in no acute distress. HEENT: No sinus tenderness. NECK: No posterior cervical nodes. LUNGS: Decreased breath sounds at bases. No wheezes. CV: Normal S1, S2. ABD: Obese nontender. MSK: Trace  to 1+ pitting edema negative [place]. NEURO: Intact.  Lab results: Results for orders placed during the hospital encounter of 01/21/12 (from the past 48 hour(s))  GLUCOSE, CAPILLARY     Status: Abnormal   Collection Time   01/29/12  8:40 PM      Component Value Range Comment   Glucose-Capillary 145 (*) 70 - 99 (mg/dL)   GLUCOSE, CAPILLARY     Status: Abnormal   Collection Time   01/29/12 11:24 PM      Component Value Range Comment   Glucose-Capillary 170 (*) 70 - 99 (mg/dL)   GLUCOSE, CAPILLARY     Status: Abnormal   Collection Time   01/30/12  4:22 AM      Component Value Range Comment   Glucose-Capillary 179 (*) 70 - 99 (mg/dL)   PROTIME-INR     Status: Abnormal   Collection Time   01/30/12  6:35 AM      Component Value Range Comment   Prothrombin Time 20.1 (*) 11.6 - 15.2 (seconds)    INR 1.68 (*) 0.00 - 1.49    HEPARIN LEVEL (UNFRACTIONATED)     Status: Abnormal   Collection Time   01/30/12  6:35 AM      Component Value Range Comment   Heparin Unfractionated 0.72 (*) 0.30 - 0.70 (IU/mL)   CBC     Status: Abnormal   Collection Time   01/30/12  6:35 AM      Component Value Range Comment   WBC 9.6  4.0 - 10.5 (K/uL)    RBC 3.52 (*) 3.87 - 5.11 (MIL/uL)    Hemoglobin 9.1 (*) 12.0 - 15.0 (g/dL)    HCT 16.1 (*) 09.6 - 46.0 (%)    MCV 87.8  78.0 - 100.0 (fL)    MCH 25.9 (*) 26.0 - 34.0 (pg)    MCHC 29.4 (*) 30.0 - 36.0 (g/dL)    RDW 04.5 (*) 40.9 - 15.5 (%)    Platelets 272  150 - 400 (K/uL)   GLUCOSE, CAPILLARY     Status: Abnormal   Collection Time   01/30/12  7:54 AM      Component Value Range Comment   Glucose-Capillary 143 (*) 70 - 99 (mg/dL)    Comment 1 Notify RN     GLUCOSE, CAPILLARY     Status: Abnormal   Collection Time   01/30/12 12:01 PM      Component  Value Range Comment   Glucose-Capillary 151 (*) 70 - 99 (mg/dL)    Comment 1 Notify RN     GLUCOSE, CAPILLARY     Status: Abnormal   Collection Time   01/30/12  5:03 PM      Component Value Range Comment    Glucose-Capillary 126 (*) 70 - 99 (mg/dL)    Comment 1 Notify RN     GLUCOSE, CAPILLARY     Status: Abnormal   Collection Time   01/30/12  8:37 PM      Component Value Range Comment   Glucose-Capillary 157 (*) 70 - 99 (mg/dL)   PROTIME-INR     Status: Abnormal   Collection Time   01/31/12  5:28 AM      Component Value Range Comment   Prothrombin Time 23.2 (*) 11.6 - 15.2 (seconds)    INR 2.02 (*) 0.00 - 1.49    HEPARIN LEVEL (UNFRACTIONATED)     Status: Normal   Collection Time   01/31/12  5:28 AM      Component Value Range Comment   Heparin Unfractionated 0.45  0.30 - 0.70 (IU/mL)   CBC     Status: Abnormal   Collection Time   01/31/12  5:28 AM      Component Value Range Comment   WBC 10.1  4.0 - 10.5 (K/uL)    RBC 3.39 (*) 3.87 - 5.11 (MIL/uL)    Hemoglobin 8.9 (*) 12.0 - 15.0 (g/dL)    HCT 16.1 (*) 09.6 - 46.0 (%)    MCV 89.4  78.0 - 100.0 (fL)    MCH 26.3  26.0 - 34.0 (pg)    MCHC 29.4 (*) 30.0 - 36.0 (g/dL)    RDW 04.5 (*) 40.9 - 15.5 (%)    Platelets 289  150 - 400 (K/uL)   GLUCOSE, CAPILLARY     Status: Abnormal   Collection Time   01/31/12  8:21 AM      Component Value Range Comment   Glucose-Capillary 134 (*) 70 - 99 (mg/dL)    Comment 1 Notify RN     GLUCOSE, CAPILLARY     Status: Abnormal   Collection Time   01/31/12 12:36 PM      Component Value Range Comment   Glucose-Capillary 138 (*) 70 - 99 (mg/dL)    Comment 1 Notify RN     GLUCOSE, CAPILLARY     Status: Abnormal   Collection Time   01/31/12  1:35 PM      Component Value Range Comment   Glucose-Capillary 182 (*) 70 - 99 (mg/dL)   GLUCOSE, CAPILLARY     Status: Abnormal   Collection Time   01/31/12  3:55 PM      Component Value Range Comment   Glucose-Capillary 182 (*) 70 - 99 (mg/dL)     Studies/Results: No results found.  Patient Active Problem List  Diagnoses  . HYPERLIPIDEMIA  . OBSTRUCTIVE SLEEP APNEA  . HYPERTENSION  . ATRIAL FIBRILLATION WITH RAPID VENTRICULAR RESPONSE  . Acute combined  systolic and diastolic heart failure  . 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 BRONCHITIS, RECURRENT  . ASTHMA  . Obesity hypoventilation syndrome  . Prosthetic joint infection  . Group B streptococcal infection  . Gout  . Yeast infection involving the vagina and surrounding area  . SBO (small bowel obstruction)  . DM (diabetes mellitus)  . Chronic anticoagulation  . Oral candidiasis    Impression: Deconditioning. Combined systolic and diastolic heart failure. Gradually improving.  Asthma presently without exacerbation. Morbid obesity. Obesity hypoventilation syndrome. Sleep apnea. Diabetes mellitus. Coumadin therapy presently therapeutic.    Plan: Continue physical therapy. Transition to home when appropriate and family supportive. Followup PT/INR.   [name], Zanae Kuehnle 01/31/2012 8:22 PM

## 2012-01-31 NOTE — Progress Notes (Signed)
ANTICOAGULATION CONSULT NOTE - Follow Up Consult  Pharmacy Consult for Heparin/Coumadin Indication: atrial fibrillation  Allergies  Allergen Reactions  . Daptomycin Other (See Comments)    Elevated CPK  . Morphine And Related Nausea And Vomiting  . Keflex Rash    unknown  . Celecoxib     unknown  . Codeine     unknown  . Fluoxetine Hcl     unknown  . Latex     REACTION: undefined  . Ofloxacin     unknown  . Penicillins     unknown  . Rofecoxib     unknown  . Sulfonamide Derivatives     unknown    Patient Measurements: Height: 5\' 5"  (165.1 cm) Weight: 280 lb 12.8 oz (127.369 kg) IBW/kg (Calculated) : 57  Heparin Dosing Weight:    Vital Signs: Temp: 98 F (36.7 C) (02/22 0500) Temp src: Oral (02/22 0500) BP: 153/54 mmHg (02/22 0500) Pulse Rate: 49  (02/22 0500)  Labs:  Basename 01/31/12 0528 01/30/12 0635 01/29/12 0600  HGB 8.9* 9.1* --  HCT 30.3* 30.9* 30.6*  PLT 289 272 252  APTT -- -- --  LABPROT 23.2* 20.1* 19.3*  INR 2.02* 1.68* 1.60*  HEPARINUNFRC 0.45 0.72* 0.59  CREATININE -- -- 2.37*  CKTOTAL -- -- --  CKMB -- -- --  TROPONINI -- -- --   Estimated Creatinine Clearance: 33.5 ml/min (by C-G formula based on Cr of 2.37).  Assessment: 61 yo F admitted with SOB and intubated in ED. Patient recently admitted in December on Coumadin for AFib/hx CVA. Coumadin PTA for Afib and CVA 5mg /d, INR = 2.02 today, Heparin level therapeutic, CBC stable  Goals: Heparin level 0.3-0.7 with INR 2-3  Plan: Continue heparin at 2050 units / hr Coumadin 7.5 mg po x 1 today Follow up in AM  Thank you.  Okey Regal, PharmD

## 2012-02-01 LAB — CBC
HCT: 29 % — ABNORMAL LOW (ref 36.0–46.0)
MCH: 26.5 pg (ref 26.0–34.0)
MCHC: 29.7 g/dL — ABNORMAL LOW (ref 30.0–36.0)
MCV: 89.2 fL (ref 78.0–100.0)
RDW: 20.3 % — ABNORMAL HIGH (ref 11.5–15.5)

## 2012-02-01 MED ORDER — WARFARIN SODIUM 5 MG PO TABS
5.0000 mg | ORAL_TABLET | Freq: Every day | ORAL | Status: DC
  Administered 2012-02-01 – 2012-02-03 (×3): 5 mg via ORAL
  Filled 2012-02-01 (×4): qty 1

## 2012-02-01 MED ORDER — IPRATROPIUM-ALBUTEROL 18-103 MCG/ACT IN AERO
2.0000 | INHALATION_SPRAY | Freq: Three times a day (TID) | RESPIRATORY_TRACT | Status: DC
  Administered 2012-02-01 – 2012-02-05 (×12): 2 via RESPIRATORY_TRACT
  Filled 2012-02-01: qty 14.7

## 2012-02-01 NOTE — Progress Notes (Signed)
ANTICOAGULATION CONSULT NOTE - Follow Up Consult  Pharmacy Consult for Heparin/Coumadin Indication: atrial fibrillation  Allergies  Allergen Reactions  . Daptomycin Other (See Comments)    Elevated CPK  . Morphine And Related Nausea And Vomiting  . Keflex Rash    unknown  . Celecoxib     unknown  . Codeine     unknown  . Fluoxetine Hcl     unknown  . Latex     REACTION: undefined  . Ofloxacin     unknown  . Penicillins     unknown  . Rofecoxib     unknown  . Sulfonamide Derivatives     unknown    Patient Measurements: Height: 5\' 5"  (165.1 cm) Weight: 280 lb 12.8 oz (127.369 kg) IBW/kg (Calculated) : 57  Heparin Dosing Weight:    Vital Signs: Temp: 98.1 F (36.7 C) (02/23 0500) BP: 176/71 mmHg (02/23 0500) Pulse Rate: 60  (02/23 0500)  Labs:  Basename 02/01/12 0630 01/31/12 0528 01/30/12 0635  HGB 8.6* 8.9* --  HCT 29.0* 30.3* 30.9*  PLT 290 289 272  APTT -- -- --  LABPROT 26.8* 23.2* 20.1*  INR 2.43* 2.02* 1.68*  HEPARINUNFRC 0.63 0.45 0.72*  CREATININE -- -- --  CKTOTAL -- -- --  CKMB -- -- --  TROPONINI -- -- --   Estimated Creatinine Clearance: 33.5 ml/min (by C-G formula based on Cr of 2.37).  Assessment: 61 yo F admitted with SOB and intubated in ED. Patient recently admitted in December on Coumadin for AFib/hx CVA. Coumadin PTA for Afib and CVA 5mg /d, INR = 2.43 today, Heparin level therapeutic, CBC stable  Goals: Heparin level 0.3-0.7 with INR 2-3  Plan: Discontinue heparin Coumadin 5 mg po daily Follow up in AM  Thank you.  Okey Regal, PharmD

## 2012-02-01 NOTE — Progress Notes (Signed)
Subjective:  Denies chest pain complaints of shortness of breath with minimal exertion working with physical therapist. States gradually getting stronger  Objective:  Vital Signs in the last 24 hours: Temp:  [98.1 F (36.7 C)] 98.1 F (36.7 C) (02/23 0500) Pulse Rate:  [48-60] 60  (02/23 0500) Resp:  [15-18] 18  (02/23 0500) BP: (147-176)/(62-71) 176/71 mmHg (02/23 0500) SpO2:  [90 %-100 %] 100 % (02/23 0500) FiO2 (%):  [28 %] 28 % (02/22 2037)  Intake/Output from previous day: 02/22 0701 - 02/23 0700 In: 320 [P.O.:320] Out: 1351 [Urine:1350; Stool:1] Intake/Output from this shift:    Physical Exam: Neck: no adenopathy, no carotid bruit, no JVD and supple, symmetrical, trachea midline Lungs: clear to auscultation bilaterally Heart: regular rate and rhythm, S1, S2 normal and Soft systolic murmur noted Abdomen: soft, non-tender; bowel sounds normal; no masses,  no organomegaly Extremities: extremities normal, atraumatic, no cyanosis or edema  Lab Results:  Basename 02/01/12 0630 01/31/12 0528  WBC 10.6* 10.1  HGB 8.6* 8.9*  PLT 290 289   No results found for this basename: NA:2,K:2,CL:2,CO2:2,GLUCOSE:2,BUN:2,CREATININE:2 in the last 72 hours No results found for this basename: TROPONINI:2,CK,MB:2 in the last 72 hours Hepatic Function Panel No results found for this basename: PROT,ALBUMIN,AST,ALT,ALKPHOS,BILITOT,BILIDIR,IBILI in the last 72 hours No results found for this basename: CHOL in the last 72 hours No results found for this basename: PROTIME in the last 72 hours  Imaging: Imaging results have been reviewed and No results found.  Cardiac Studies:  Assessment/Plan:  Status post very small non-Q-wave myocardial infarction stable status  Status post diastolic heart failure Status post acute respiratory failure Hypertension History of pontine CVA History of A. fib flutter in the past Her chronic left knee septic arthritis Morbid obesity Anemia of chronic  disease Chronic kidney disease stage IV Plan Increase ambulation as tolerated Home soon once family situation resolved  LOS: 11 days    [name] N 02/01/2012, 11:10 AM

## 2012-02-02 LAB — CBC
HCT: 31.6 % — ABNORMAL LOW (ref 36.0–46.0)
MCV: 90.8 fL (ref 78.0–100.0)
Platelets: 275 10*3/uL (ref 150–400)
RBC: 3.48 MIL/uL — ABNORMAL LOW (ref 3.87–5.11)
WBC: 10.4 10*3/uL (ref 4.0–10.5)

## 2012-02-02 LAB — GLUCOSE, CAPILLARY
Glucose-Capillary: 143 mg/dL — ABNORMAL HIGH (ref 70–99)
Glucose-Capillary: 180 mg/dL — ABNORMAL HIGH (ref 70–99)
Glucose-Capillary: 202 mg/dL — ABNORMAL HIGH (ref 70–99)
Glucose-Capillary: 189 mg/dL — ABNORMAL HIGH (ref 70–99)

## 2012-02-02 MED ORDER — FLUTICASONE PROPIONATE HFA 220 MCG/ACT IN AERO
2.0000 | INHALATION_SPRAY | Freq: Two times a day (BID) | RESPIRATORY_TRACT | Status: DC
  Administered 2012-02-02 – 2012-02-05 (×6): 2 via RESPIRATORY_TRACT
  Filled 2012-02-02: qty 12

## 2012-02-02 MED ORDER — METOPROLOL TARTRATE 25 MG PO TABS
25.0000 mg | ORAL_TABLET | Freq: Two times a day (BID) | ORAL | Status: DC
  Administered 2012-02-03 – 2012-02-05 (×3): 25 mg via ORAL
  Filled 2012-02-02 (×7): qty 1

## 2012-02-02 NOTE — Progress Notes (Signed)
ANTICOAGULATION CONSULT NOTE - Follow Up Consult  Pharmacy Consult for Heparin/Coumadin Indication: atrial fibrillation  Allergies  Allergen Reactions  . Daptomycin Other (See Comments)    Elevated CPK  . Morphine And Related Nausea And Vomiting  . Keflex Rash    unknown  . Celecoxib     unknown  . Codeine     unknown  . Fluoxetine Hcl     unknown  . Latex     REACTION: undefined  . Ofloxacin     unknown  . Penicillins     unknown  . Rofecoxib     unknown  . Sulfonamide Derivatives     unknown    Patient Measurements: Height: 5\' 5"  (165.1 cm) Weight: 262 lb 5.6 oz (119 kg) IBW/kg (Calculated) : 57  Heparin Dosing Weight:    Vital Signs: Temp: 97.9 F (36.6 C) (02/24 0500) Temp src: Oral (02/24 0500) BP: 151/54 mmHg (02/24 0500) Pulse Rate: 53  (02/24 0500)  Labs:  Basename 02/02/12 0517 02/01/12 0630 01/31/12 0528  HGB 9.1* 8.6* --  HCT 31.6* 29.0* 30.3*  PLT 275 290 289  APTT -- -- --  LABPROT 25.5* 26.8* 23.2*  INR 2.28* 2.43* 2.02*  HEPARINUNFRC -- 0.63 0.45  CREATININE -- -- --  CKTOTAL -- -- --  CKMB -- -- --  TROPONINI -- -- --   Estimated Creatinine Clearance: 32.2 ml/min (by C-G formula based on Cr of 2.37).  Assessment: 61 yo F admitted with SOB and intubated in ED. Patient recently admitted in December on Coumadin for AFib/hx CVA. Coumadin PTA for Afib and CVA 5mg /d, INR = 2.28 today  Goals: INR 2-3  Plan: Continue Coumadin 5 mg po daily Follow up in AM  Thank you.  Okey Regal, PharmD

## 2012-02-02 NOTE — Progress Notes (Signed)
Subjective:  Patient denies any chest pain or shortness of breath Complains of her knee joint pains  Objective:  Vital Signs in the last 24 hours: Temp:  [97.9 F (36.6 C)-98.1 F (36.7 C)] 97.9 F (36.6 C) (02/24 0500) Pulse Rate:  [53-55] 53  (02/24 0500) Resp:  [18-20] 18  (02/24 0500) BP: (151-166)/(52-67) 151/54 mmHg (02/24 0500) SpO2:  [91 %-99 %] 99 % (02/24 0500) Weight:  [119 kg (262 lb 5.6 oz)] 119 kg (262 lb 5.6 oz) (02/24 0500)  Intake/Output from previous day: 02/23 0701 - 02/24 0700 In: -  Out: 1850 [Urine:1850] Intake/Output from this shift:    Physical Exam: Neck: no adenopathy, no carotid bruit, no JVD, supple, symmetrical, trachea midline and thyroid not enlarged, symmetric, no tenderness/mass/nodules Lungs: clear to auscultation bilaterally Heart: regular rate and rhythm, S1, S2 normal, no murmur, click, rub or gallop Abdomen: soft, non-tender; bowel sounds normal; no masses,  no organomegaly Extremities: No clubbing cyanosis trace edema with chronic dermatosis noted  Lab Results:  Basename 02/02/12 0517 02/01/12 0630  WBC 10.4 10.6*  HGB 9.1* 8.6*  PLT 275 290   No results found for this basename: NA:2,K:2,CL:2,CO2:2,GLUCOSE:2,BUN:2,CREATININE:2 in the last 72 hours No results found for this basename: TROPONINI:2,CK,MB:2 in the last 72 hours Hepatic Function Panel No results found for this basename: PROT,ALBUMIN,AST,ALT,ALKPHOS,BILITOT,BILIDIR,IBILI in the last 72 hours No results found for this basename: CHOL in the last 72 hours No results found for this basename: PROTIME in the last 72 hours  Imaging: Imaging results have been reviewed and No results found.  Cardiac Studies:  Assessment/Plan:  Status post very small non-Q-wave myocardial infarction stable Status post diastolic heart failure Status post acute respiratory failure Hypertension History of pontine CVA History of A. fib flutter in the past Chronic prostatic left knee septic  arthritis Morbid obesity Anemia of chronic disease stable Chronic kidney disease stage IV Plan Continue present management Increase ambulation as tolerated  LOS: 12 days    Zahki Hoogendoorn N 02/02/2012, 11:58 AM

## 2012-02-03 LAB — GLUCOSE, CAPILLARY: Glucose-Capillary: 157 mg/dL — ABNORMAL HIGH (ref 70–99)

## 2012-02-03 LAB — CBC
Hemoglobin: 9.1 g/dL — ABNORMAL LOW (ref 12.0–15.0)
MCH: 26.6 pg (ref 26.0–34.0)
MCHC: 29.4 g/dL — ABNORMAL LOW (ref 30.0–36.0)
Platelets: 294 10*3/uL (ref 150–400)
RDW: 20.9 % — ABNORMAL HIGH (ref 11.5–15.5)

## 2012-02-03 LAB — PROTIME-INR
INR: 2.53 — ABNORMAL HIGH (ref 0.00–1.49)
Prothrombin Time: 27.7 seconds — ABNORMAL HIGH (ref 11.6–15.2)

## 2012-02-03 NOTE — Progress Notes (Signed)
Physical Therapy Treatment Patient Details Name: SHANICA CASTELLANOS MRN: [id number] DOB: 10-08-1951 Today's Date: 02/03/2012  PT Assessment/Plan  PT - Assessment/Plan Comments on Treatment Session: Pt moving well.  Does experience DOE but 02 sats remained >90% entire session with no supplemental 02.   PT Plan: Discharge plan remains appropriate PT Frequency: Min 3X/week Follow Up Recommendations: Home health PT;Supervision for mobility/OOB Equipment Recommended: None recommended by PT PT Goals  Acute Rehab PT Goals PT Goal: Sit to Stand - Progress: Met PT Goal: Stand to Sit - Progress: Met PT Goal: Ambulate - Progress: Progressing toward goal  PT Treatment Precautions/Restrictions  Precautions Precautions: Fall Precaution Comments: O2 Restrictions Weight Bearing Restrictions: No Mobility (including Balance) Bed Mobility Bed Mobility: No Transfers Sit to Stand: 6: Modified independent (Device/Increase time);From chair/3-in-1;With upper extremity assist;With armrests Stand to Sit: 6: Modified independent (Device/Increase time);To chair/3-in-1;With upper extremity assist;With armrests Ambulation/Gait Ambulation/Gait Assistance: 5: Supervision Ambulation/Gait Assistance Details (indicate cue type and reason): Cues for breathing technique.  Pt's 02 sats remained >90% on RA during ambulation but did present with DOE.  Pt fairly steady throughout but on one occasion mild LOB when someone came up from behind & talking to her.   Ambulation Distance (Feet): 180 Feet Assistive device: Rolling walker Gait Pattern: Step-through pattern;Decreased stride length;Trunk flexed Stairs: No Wheelchair Mobility Wheelchair Mobility: No  Posture/Postural Control Posture/Postural Control: No significant limitations Exercise  General Exercises - Lower Extremity Ankle Circles/Pumps: AROM;Both;10 reps;Seated Long Arc Quad: AROM;Strengthening;Both;10 reps;Seated Toe Raises: AROM;Strengthening;Both;10  reps;Seated Heel Raises: AROM;Strengthening;Both;10 reps;Seated End of Session PT - End of Session Equipment Utilized During Treatment: Gait belt Activity Tolerance: Patient tolerated treatment well Patient left: in chair;with call bell in reach General Behavior During Session: [facility] for tasks performed Cognition: [facility] for tasks performed  Lara Mulch 02/03/2012, 2:00 PM [phone]

## 2012-02-03 NOTE — Progress Notes (Signed)
02/03/12 1600 UR Completed.   Tera Mater, RN, BSN [phone]

## 2012-02-03 NOTE — Progress Notes (Signed)
Chart reviewed noted that PT evaluation recommending HHPT. CM will continue to follow and arrange for any HH needs. MD please be aware that if pt should need home oxygen , her insurance requires use of a vendor which may take some time to deliver equipment so order will need to be placed asap.  Johny Shock RN MPH Case manager [phone]

## 2012-02-03 NOTE — Progress Notes (Signed)
ANTICOAGULATION CONSULT NOTE - Follow Up Consult  Pharmacy Consult for Coumadin Indication: atrial fibrillation  Allergies  Allergen Reactions  . Daptomycin Other (See Comments)    Elevated CPK  . Morphine And Related Nausea And Vomiting  . Keflex Rash    unknown  . Celecoxib     unknown  . Codeine     unknown  . Fluoxetine Hcl     unknown  . Latex     REACTION: undefined  . Ofloxacin     unknown  . Penicillins     unknown  . Rofecoxib     unknown  . Sulfonamide Derivatives     unknown    Patient Measurements: Height: 5\' 5"  (165.1 cm) Weight: 262 lb 5.6 oz (119 kg) IBW/kg (Calculated) : 57  Heparin Dosing Weight:   Vital Signs: Temp: 98.1 F (36.7 C) (02/25 0500) Temp src: Oral (02/25 0500) BP: 150/61 mmHg (02/25 0500) Pulse Rate: 56  (02/25 0500)  Labs:  Basename 02/03/12 0605 02/02/12 0517 02/01/12 0630  HGB 9.1* 9.1* --  HCT 31.0* 31.6* 29.0*  PLT 294 275 290  APTT -- -- --  LABPROT 27.7* 25.5* 26.8*  INR 2.53* 2.28* 2.43*  HEPARINUNFRC -- -- 0.63  CREATININE -- -- --  CKTOTAL -- -- --  CKMB -- -- --  TROPONINI -- -- --   Estimated Creatinine Clearance: 32.2 ml/min (by C-G formula based on Cr of 2.37).   Assessment: Patient is a 61 y.o F on coumadin for Afib.  INR is at goal.  No bleeding noted.  Goal of Therapy:  INR 2-3   Plan:  1) continue home Coumadin regimen of 5mg  daily  Elspeth Blucher P 02/03/2012,10:20 AM

## 2012-02-03 NOTE — Progress Notes (Signed)
Subjective:  Patient continues to make slow progress with physical therapy. She ambulated without oxygen support. Her O2 sats were maintained. She has not met her ambulatory goals for physical therapy. Her other acute short-term goals have been met. Patient denies chest pain or palpitations. Denies nausea or vomiting. Her family still does not feel that they can take care of her at home. She however is determined to try with home PT support.   Allergies  Allergen Reactions  . Daptomycin Other (See Comments)    Elevated CPK  . Morphine And Related Nausea And Vomiting  . Keflex Rash    unknown  . Celecoxib     unknown  . Codeine     unknown  . Fluoxetine Hcl     unknown  . Latex     REACTION: undefined  . Ofloxacin     unknown  . Penicillins     unknown  . Rofecoxib     unknown  . Sulfonamide Derivatives     unknown   Current Facility-Administered Medications  Medication Dose Route Frequency Provider Last Rate Last Dose  . 0.9 %  sodium chloride infusion  250 mL Intravenous PRN Canary Brim, NP      . albuterol (PROVENTIL) (5 MG/ML) 0.5% nebulizer solution 2.5 mg  2.5 mg Nebulization Q6H PRN Provider Default, MD      . albuterol-ipratropium (COMBIVENT) inhaler 2 puff  2 puff Inhalation TID [place] Lions, RRT   2 puff at 02/03/12 2041  . allopurinol (ZYLOPRIM) tablet 100 mg  100 mg Oral Daily Ricki Rodriguez, MD   100 mg at 02/03/12 0948  . ALPRAZolam Prudy Feeler) tablet 0.25 mg  0.25 mg Oral TID PRN Ricki Rodriguez, MD   0.25 mg at 01/28/12 2204  . amiodarone (PACERONE) tablet 200 mg  200 mg Oral Daily Robynn Pane, MD   200 mg at 02/03/12 0948  . aspirin chewable tablet 81 mg  81 mg Oral Daily Coralyn Helling, MD   81 mg at 02/03/12 0948  . calcitRIOL (ROCALTROL) capsule 0.25 mcg  0.25 mcg Oral Daily Trevor Iha, MD   0.25 mcg at 02/03/12 0957  . darbepoetin (ARANESP) injection 200 mcg  200 mcg Subcutaneous Q Mon-1800 Trevor Iha, MD   200 mcg at 02/03/12 1738  .  digoxin (LANOXIN) tablet 125 mcg  125 mcg Oral Daily Willey Blade, MD   125 mcg at 02/03/12 [phone]  . fluticasone (FLOVENT HFA) 220 MCG/ACT inhaler 2 puff  2 puff Inhalation BID Gena Apple Turner, RRT   2 puff at 02/03/12 2041  . furosemide (LASIX) tablet 160 mg  160 mg Oral BID Trevor Iha, MD   160 mg at 02/03/12 1717  . hydrALAZINE (APRESOLINE) injection 10 mg  10 mg Intravenous Q4H PRN Priscella Mann, MD   10 mg at 01/24/12 1707  . insulin aspart (novoLOG) injection 0-5 Units  0-5 Units Subcutaneous QHS Willey Blade, MD   2 Units at 02/03/12 2115  . insulin aspart (novoLOG) injection 0-9 Units  0-9 Units Subcutaneous TID WC Willey Blade, MD   3 Units at 02/03/12 1717  . metoprolol tartrate (LOPRESSOR) tablet 25 mg  25 mg Oral BID Willey Blade, MD   25 mg at 02/03/12 0948  . pantoprazole (PROTONIX) EC tablet 40 mg  40 mg Oral Q1200 Leslye Peer, MD   40 mg at 02/03/12 1211  . simvastatin (ZOCOR) tablet 20 mg  20 mg Oral Daily Ricki Rodriguez,  MD   20 mg at 02/03/12 0948  . spironolactone (ALDACTONE) tablet 25 mg  25 mg Oral Daily Priscella Mann, MD   25 mg at 02/03/12 0948  . warfarin (COUMADIN) tablet 5 mg  5 mg Oral q1800 Willey Blade, MD   5 mg at 02/03/12 1717  . zolpidem (AMBIEN) tablet 5 mg  5 mg Oral QHS PRN Ricki Rodriguez, MD   5 mg at 01/26/12 2254    Objective: Blood pressure 146/76, pulse 53, temperature 97.8 F (36.6 C), temperature source Oral, resp. rate 20, height 5\' 3"  (1.6 m), weight 262 lb 5.6 oz (119 kg), SpO2 96.00%.  Well-developed obese black female in no acute distress. HEENT:no sinus tenderness. NECK:no posterior cervical nodes. No JVD at 60. LUNGS:clear to auscultation. No vocal fremitus. ZO:XWRUEA S1, S2. VWU:JWJXB, nontender. MSK:1+ nonpitting edema. Negative Homans. NEURO:intact.  Lab results: Results for orders placed during the hospital encounter of 01/21/12 (from the past 48 hour(s))  PROTIME-INR     Status: Abnormal   Collection Time   02/02/12  5:17 AM       Component Value Range Comment   Prothrombin Time 25.5 (*) 11.6 - 15.2 (seconds)    INR 2.28 (*) 0.00 - 1.49    CBC     Status: Abnormal   Collection Time   02/02/12  5:17 AM      Component Value Range Comment   WBC 10.4  4.0 - 10.5 (K/uL)    RBC 3.48 (*) 3.87 - 5.11 (MIL/uL)    Hemoglobin 9.1 (*) 12.0 - 15.0 (g/dL)    HCT 14.7 (*) 82.9 - 46.0 (%)    MCV 90.8  78.0 - 100.0 (fL)    MCH 26.1  26.0 - 34.0 (pg)    MCHC 28.8 (*) 30.0 - 36.0 (g/dL)    RDW 56.2 (*) 13.0 - 15.5 (%)    Platelets 275  150 - 400 (K/uL)   GLUCOSE, CAPILLARY     Status: Abnormal   Collection Time   02/02/12  8:04 AM      Component Value Range Comment   Glucose-Capillary 143 (*) 70 - 99 (mg/dL)   GLUCOSE, CAPILLARY     Status: Abnormal   Collection Time   02/02/12 11:25 AM      Component Value Range Comment   Glucose-Capillary 180 (*) 70 - 99 (mg/dL)   GLUCOSE, CAPILLARY     Status: Abnormal   Collection Time   02/02/12  5:07 PM      Component Value Range Comment   Glucose-Capillary 202 (*) 70 - 99 (mg/dL)   GLUCOSE, CAPILLARY     Status: Abnormal   Collection Time   02/02/12  8:56 PM      Component Value Range Comment   Glucose-Capillary 189 (*) 70 - 99 (mg/dL)   PROTIME-INR     Status: Abnormal   Collection Time   02/03/12  6:05 AM      Component Value Range Comment   Prothrombin Time 27.7 (*) 11.6 - 15.2 (seconds)    INR 2.53 (*) 0.00 - 1.49    CBC     Status: Abnormal   Collection Time   02/03/12  6:05 AM      Component Value Range Comment   WBC 10.7 (*) 4.0 - 10.5 (K/uL)    RBC 3.42 (*) 3.87 - 5.11 (MIL/uL)    Hemoglobin 9.1 (*) 12.0 - 15.0 (g/dL)    HCT 86.5 (*) 78.4 - 46.0 (%)  MCV 90.6  78.0 - 100.0 (fL)    MCH 26.6  26.0 - 34.0 (pg)    MCHC 29.4 (*) 30.0 - 36.0 (g/dL)    RDW 16.1 (*) 09.6 - 15.5 (%)    Platelets 294  150 - 400 (K/uL)   GLUCOSE, CAPILLARY     Status: Abnormal   Collection Time   02/03/12  7:36 AM      Component Value Range Comment   Glucose-Capillary 161 (*) 70 - 99  (mg/dL)   GLUCOSE, CAPILLARY     Status: Abnormal   Collection Time   02/03/12 11:38 AM      Component Value Range Comment   Glucose-Capillary 220 (*) 70 - 99 (mg/dL)   GLUCOSE, CAPILLARY     Status: Abnormal   Collection Time   02/03/12  5:01 PM      Component Value Range Comment   Glucose-Capillary 157 (*) 70 - 99 (mg/dL)   GLUCOSE, CAPILLARY     Status: Abnormal   Collection Time   02/03/12  8:40 PM      Component Value Range Comment   Glucose-Capillary 210 (*) 70 - 99 (mg/dL)     Studies/Results: No results found.  Patient Active Problem List  Diagnoses  . HYPERLIPIDEMIA  . OBSTRUCTIVE SLEEP APNEA  . HYPERTENSION  . ATRIAL FIBRILLATION WITH RAPID VENTRICULAR RESPONSE  . Acute combined systolic and diastolic heart failure  . 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 BRONCHITIS, RECURRENT  . ASTHMA  . Obesity hypoventilation syndrome  . Prosthetic joint infection  . Group B streptococcal infection  . Gout  . Yeast infection involving the vagina and surrounding area  . SBO (small bowel obstruction)  . DM (diabetes mellitus)  . Chronic anticoagulation  . Oral candidiasis    Impression: Combined systolic and diastolic heart failure, compensated. Obstructive sleep apnea. She is using her CPAP consistently here in the hospital. Asthma presently stable. Obesity hypoventilation syndrome. Prosthetic joint infection chronic Marked deconditioning with gradual improvement. Atrial fibrillation with rapid ventricular response history. Chronic Coumadin therapy.   Plan: Continue physical therapy. Discharge home within the next 24-48 hours after cleared by physical therapy. Home physical therapy thereafter.   [name], Dulcinea Kinser 02/03/2012 11:35 PM

## 2012-02-04 LAB — GLUCOSE, CAPILLARY
Glucose-Capillary: 216 mg/dL — ABNORMAL HIGH (ref 70–99)
Glucose-Capillary: 187 mg/dL — ABNORMAL HIGH (ref 70–99)

## 2012-02-04 LAB — CBC
HCT: 31.8 % — ABNORMAL LOW (ref 36.0–46.0)
Hemoglobin: 9.4 g/dL — ABNORMAL LOW (ref 12.0–15.0)
MCH: 26.6 pg (ref 26.0–34.0)
MCHC: 29.6 g/dL — ABNORMAL LOW (ref 30.0–36.0)
MCV: 89.8 fL (ref 78.0–100.0)
RBC: 3.54 MIL/uL — ABNORMAL LOW (ref 3.87–5.11)

## 2012-02-04 MED ORDER — WARFARIN SODIUM 2 MG PO TABS
2.0000 mg | ORAL_TABLET | Freq: Once | ORAL | Status: AC
  Administered 2012-02-04: 2 mg via ORAL
  Filled 2012-02-04: qty 1

## 2012-02-04 NOTE — Progress Notes (Signed)
Received by Marinda Elk Edd

## 2012-02-04 NOTE — Progress Notes (Signed)
Physical Therapy Treatment Patient Details Name: [name] MRN: [id number] DOB: Feb 01, 1951 Today's Date: 02/04/2012  PT Assessment/Plan  PT - Assessment/Plan Comments on Treatment Session: Pt not requiring any true physical (A) at thsi time- Pt mod I for sit<>stand transfers, Supervision for ambulation, & Min guard (A) for stairs.  Pt states husband will be present & be able to provide (S)/(A) as needed.   PT Plan: Discharge plan remains appropriate PT Frequency: Min 3X/week Follow Up Recommendations: Supervision for mobility/OOB Equipment Recommended: None recommended by PT PT Goals  Acute Rehab PT Goals PT Goal: Sit to Stand - Progress: Met PT Goal: Stand to Sit - Progress: Met PT Goal: Ambulate - Progress: Not met PT Goal: Up/Down Stairs - Progress: Met  PT Treatment Precautions/Restrictions  Precautions Precautions: Fall Precaution Comments: O2 Restrictions Weight Bearing Restrictions: No Mobility (including Balance) Bed Mobility Bed Mobility: No Transfers Sit to Stand: 6: Modified independent (Device/Increase time);From chair/3-in-1;With upper extremity assist;With armrests Stand to Sit: 6: Modified independent (Device/Increase time);To chair/3-in-1;With upper extremity assist;With armrests Ambulation/Gait Ambulation/Gait Assistance: 5: Supervision Ambulation/Gait Assistance Details (indicate cue type and reason): Supervision for safety.  Pt continues to present with DOE however, 02 sats >90%.  Cues for pursed lip breathing.   As returning back to room pt reports feeling like Lt knee going to buckle.   Ambulation Distance (Feet): 150 Feet Assistive device: Rolling walker Gait Pattern: Decreased hip/knee flexion - left;Decreased stride length Stairs: Yes Stairs Assistance: Other (comment) (Min Guard (A)) Stair Management Technique: Forwards;Step to pattern;Two rails Number of Stairs: 2  Wheelchair Mobility Wheelchair Mobility: No  Posture/Postural  Control Posture/Postural Control: No significant limitations Balance Balance Assessed: No Exercise    End of Session PT - End of Session Equipment Utilized During Treatment: Gait belt Activity Tolerance: Patient limited by fatigue;Patient tolerated treatment well Patient left: in chair;with call bell in reach General Behavior During Session: [facility] for tasks performed Cognition: [facility] for tasks performed  Lara Mulch 02/04/2012, 2:33 PM [phone]

## 2012-02-04 NOTE — Progress Notes (Signed)
ANTICOAGULATION CONSULT NOTE - Follow Up Consult  Pharmacy Consult for coumadin Indication: atrial fibrillation  Allergies  Allergen Reactions  . Daptomycin Other (See Comments)    Elevated CPK  . Morphine And Related Nausea And Vomiting  . Keflex Rash    unknown  . Celecoxib     unknown  . Codeine     unknown  . Fluoxetine Hcl     unknown  . Latex     REACTION: undefined  . Ofloxacin     unknown  . Penicillins     unknown  . Rofecoxib     unknown  . Sulfonamide Derivatives     unknown    Patient Measurements: Height: 5\' 3"  (160 cm) Weight: 262 lb 5.6 oz (119 kg) IBW/kg (Calculated) : 52.4  Heparin Dosing Weight:   Vital Signs: Temp: 97.9 F (36.6 C) (02/26 0500) Temp src: Oral (02/26 0500) BP: 146/54 mmHg (02/26 0500) Pulse Rate: 50  (02/26 0500)  Labs:  Basename 02/04/12 0535 02/03/12 0605 02/02/12 0517  HGB 9.4* 9.1* --  HCT 31.8* 31.0* 31.6*  PLT 271 294 275  APTT -- -- --  LABPROT 32.1* 27.7* 25.5*  INR 3.06* 2.53* 2.28*  HEPARINUNFRC -- -- --  CREATININE -- -- --  CKTOTAL -- -- --  CKMB -- -- --  TROPONINI -- -- --   Estimated Creatinine Clearance: 31.1 ml/min (by C-G formula based on Cr of 2.37).  Assessment: Patient is a 61 y.o F on coumadin for afib.  INR is therapeutic but is at upper end of therapeutic range.  No bleeding noted.  Goal of Therapy:  INR 2-3   Plan:  1) Decrease Coumadin 2 mg PO x1  [name] P 02/04/2012,9:26 AM

## 2012-02-04 NOTE — Progress Notes (Signed)
Subjective:  The patient continues with physical therapy. She still has exertional dyspnea but this is slowly improving. She denies chest pains or palpitations. She demonstrated some unsteadiness to her date with weight bearing on the left knee at completion of physical therapy. Patient still desires to go home. Have emphasized the need for patient have further short-term rehabilitation but she has expressed being against Patient's husband continues to have concerned regarding his ability to care for patient at home.   Allergies  Allergen Reactions  . Daptomycin Other (See Comments)    Elevated CPK  . Morphine And Related Nausea And Vomiting  . Keflex Rash    unknown  . Celecoxib     unknown  . Codeine     unknown  . Fluoxetine Hcl     unknown  . Latex     REACTION: undefined  . Ofloxacin     unknown  . Penicillins     unknown  . Rofecoxib     unknown  . Sulfonamide Derivatives     unknown   Current Facility-Administered Medications  Medication Dose Route Frequency Provider Last Rate Last Dose  . 0.9 %  sodium chloride infusion  250 mL Intravenous PRN Canary Brim, NP      . albuterol (PROVENTIL) (5 MG/ML) 0.5% nebulizer solution 2.5 mg  2.5 mg Nebulization Q6H PRN Provider Default, MD      . albuterol-ipratropium (COMBIVENT) inhaler 2 puff  2 puff Inhalation TID [place] Lions, RRT   2 puff at 02/04/12 2131  . allopurinol (ZYLOPRIM) tablet 100 mg  100 mg Oral Daily Ricki Rodriguez, MD   100 mg at 02/04/12 0956  . ALPRAZolam Prudy Feeler) tablet 0.25 mg  0.25 mg Oral TID PRN Ricki Rodriguez, MD   0.25 mg at 01/28/12 2204  . amiodarone (PACERONE) tablet 200 mg  200 mg Oral Daily Robynn Pane, MD   200 mg at 02/04/12 0956  . aspirin chewable tablet 81 mg  81 mg Oral Daily Coralyn Helling, MD   81 mg at 02/04/12 0955  . calcitRIOL (ROCALTROL) capsule 0.25 mcg  0.25 mcg Oral Daily Trevor Iha, MD   0.25 mcg at 02/04/12 0956  . darbepoetin (ARANESP) injection 200 mcg  200 mcg  Subcutaneous Q Mon-1800 Trevor Iha, MD   200 mcg at 02/03/12 1738  . digoxin (LANOXIN) tablet 125 mcg  125 mcg Oral Daily Willey Blade, MD   125 mcg at 02/04/12 [phone]  . fluticasone (FLOVENT HFA) 220 MCG/ACT inhaler 2 puff  2 puff Inhalation BID Gena Apple Turner, RRT   2 puff at 02/04/12 2131  . furosemide (LASIX) tablet 160 mg  160 mg Oral BID Trevor Iha, MD   160 mg at 02/04/12 1718  . hydrALAZINE (APRESOLINE) injection 10 mg  10 mg Intravenous Q4H PRN Priscella Mann, MD   10 mg at 01/24/12 1707  . insulin aspart (novoLOG) injection 0-5 Units  0-5 Units Subcutaneous QHS Willey Blade, MD   2 Units at 02/04/12 2158  . insulin aspart (novoLOG) injection 0-9 Units  0-9 Units Subcutaneous TID WC Willey Blade, MD   2 Units at 02/04/12 1718  . metoprolol tartrate (LOPRESSOR) tablet 25 mg  25 mg Oral BID Willey Blade, MD   25 mg at 02/04/12 0956  . pantoprazole (PROTONIX) EC tablet 40 mg  40 mg Oral Q1200 Leslye Peer, MD   40 mg at 02/04/12 1228  . simvastatin (ZOCOR) tablet 20 mg  20  mg Oral Daily Ricki Rodriguez, MD   20 mg at 02/04/12 0956  . spironolactone (ALDACTONE) tablet 25 mg  25 mg Oral Daily Priscella Mann, MD   25 mg at 02/04/12 0956  . warfarin (COUMADIN) tablet 2 mg  2 mg Oral ONCE-1800 Anh P Pham, PHARMD   2 mg at 02/04/12 1718  . zolpidem (AMBIEN) tablet 5 mg  5 mg Oral QHS PRN Ricki Rodriguez, MD   5 mg at 01/26/12 2254  . DISCONTD: warfarin (COUMADIN) tablet 5 mg  5 mg Oral q1800 Willey Blade, MD   5 mg at 02/03/12 1717    Objective: Blood pressure 132/52, pulse 51, temperature 98 F (36.7 C), temperature source Oral, resp. rate 20, height 5\' 3"  (1.6 m), weight 262 lb 5.6 oz (119 kg), SpO2 99.00%.  Well-developed obese black female in no acute distress. HEENT:no sinus tenderness. NECK:no posterior cervical nodes. LUNGS:clear to auscultation. No vocal fremitus. ZO:XWRUEA S1, S2. VWU:JWJXBJYNW. GNF:AOZHYQMV pitting edema. Negative Homans. NEURO:intact.  Lab results: Results  for orders placed during the hospital encounter of 01/21/12 (from the past 48 hour(s))  PROTIME-INR     Status: Abnormal   Collection Time   02/03/12  6:05 AM      Component Value Range Comment   Prothrombin Time 27.7 (*) 11.6 - 15.2 (seconds)    INR 2.53 (*) 0.00 - 1.49    CBC     Status: Abnormal   Collection Time   02/03/12  6:05 AM      Component Value Range Comment   WBC 10.7 (*) 4.0 - 10.5 (K/uL)    RBC 3.42 (*) 3.87 - 5.11 (MIL/uL)    Hemoglobin 9.1 (*) 12.0 - 15.0 (g/dL)    HCT 78.4 (*) 69.6 - 46.0 (%)    MCV 90.6  78.0 - 100.0 (fL)    MCH 26.6  26.0 - 34.0 (pg)    MCHC 29.4 (*) 30.0 - 36.0 (g/dL)    RDW 29.5 (*) 28.4 - 15.5 (%)    Platelets 294  150 - 400 (K/uL)   GLUCOSE, CAPILLARY     Status: Abnormal   Collection Time   02/03/12  7:36 AM      Component Value Range Comment   Glucose-Capillary 161 (*) 70 - 99 (mg/dL)   GLUCOSE, CAPILLARY     Status: Abnormal   Collection Time   02/03/12 11:38 AM      Component Value Range Comment   Glucose-Capillary 220 (*) 70 - 99 (mg/dL)   GLUCOSE, CAPILLARY     Status: Abnormal   Collection Time   02/03/12  5:01 PM      Component Value Range Comment   Glucose-Capillary 157 (*) 70 - 99 (mg/dL)   GLUCOSE, CAPILLARY     Status: Abnormal   Collection Time   02/03/12  8:40 PM      Component Value Range Comment   Glucose-Capillary 210 (*) 70 - 99 (mg/dL)   PROTIME-INR     Status: Abnormal   Collection Time   02/04/12  5:35 AM      Component Value Range Comment   Prothrombin Time 32.1 (*) 11.6 - 15.2 (seconds)    INR 3.06 (*) 0.00 - 1.49    CBC     Status: Abnormal   Collection Time   02/04/12  5:35 AM      Component Value Range Comment   WBC 10.8 (*) 4.0 - 10.5 (K/uL)    RBC 3.54 (*) 3.87 -  5.11 (MIL/uL)    Hemoglobin 9.4 (*) 12.0 - 15.0 (g/dL)    HCT 16.1 (*) 09.6 - 46.0 (%)    MCV 89.8  78.0 - 100.0 (fL)    MCH 26.6  26.0 - 34.0 (pg)    MCHC 29.6 (*) 30.0 - 36.0 (g/dL)    RDW 04.5 (*) 40.9 - 15.5 (%)    Platelets 271  150 -  400 (K/uL)   GLUCOSE, CAPILLARY     Status: Abnormal   Collection Time   02/04/12  7:28 AM      Component Value Range Comment   Glucose-Capillary 172 (*) 70 - 99 (mg/dL)   GLUCOSE, CAPILLARY     Status: Abnormal   Collection Time   02/04/12 11:55 AM      Component Value Range Comment   Glucose-Capillary 216 (*) 70 - 99 (mg/dL)   GLUCOSE, CAPILLARY     Status: Abnormal   Collection Time   02/04/12  5:14 PM      Component Value Range Comment   Glucose-Capillary 187 (*) 70 - 99 (mg/dL)     Studies/Results: No results found.  Patient Active Problem List  Diagnoses  . HYPERLIPIDEMIA  . OBSTRUCTIVE SLEEP APNEA  . HYPERTENSION  . ATRIAL FIBRILLATION WITH RAPID VENTRICULAR RESPONSE  . Acute combined systolic and diastolic heart failure  . 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 BRONCHITIS, RECURRENT  . ASTHMA  . Obesity hypoventilation syndrome  . Prosthetic joint infection  . Group B streptococcal infection  . Gout  . Yeast infection involving the vagina and surrounding area  . SBO (small bowel obstruction)  . DM (diabetes mellitus)  . Chronic anticoagulation  . Oral candidiasis    Impression: Combined systolic and diastolic congestive heart failure. Marked deconditioning with slow improvement. Morbid obesity. 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 History of atrial fibrillation on chronic Coumadin therapy. Diabetes mellitus. Possible uinsafe medical environment. Outpatient ongoing support at discharge.   Plan: Continue present therapy. Home when cleared by physical therapy.anticipate discharge tomorrow. Continue with home services as necessary.   [name], Nickol Collister 02/04/2012 11:16 PM

## 2012-02-05 LAB — CBC
Hemoglobin: 10 g/dL — ABNORMAL LOW (ref 12.0–15.0)
MCH: 27.2 pg (ref 26.0–34.0)
Platelets: 288 10*3/uL (ref 150–400)
RBC: 3.68 MIL/uL — ABNORMAL LOW (ref 3.87–5.11)

## 2012-02-05 LAB — GLUCOSE, CAPILLARY
Glucose-Capillary: 176 mg/dL — ABNORMAL HIGH (ref 70–99)
Glucose-Capillary: 187 mg/dL — ABNORMAL HIGH (ref 70–99)

## 2012-02-05 LAB — PROTIME-INR
INR: 2.87 — ABNORMAL HIGH (ref 0.00–1.49)
Prothrombin Time: 30.5 seconds — ABNORMAL HIGH (ref 11.6–15.2)

## 2012-02-05 MED ORDER — DARBEPOETIN ALFA-POLYSORBATE 200 MCG/0.4ML IJ SOLN: 200.0000 ug | INTRAMUSCULAR | Status: DC

## 2012-02-05 MED ORDER — FUROSEMIDE 80 MG PO TABS: 160.0000 mg | ORAL_TABLET | Freq: Two times a day (BID) | ORAL | Status: DC

## 2012-02-05 MED ORDER — BD GETTING STARTED TAKE HOME KIT: 3/10ML X 30G SYRINGES
1.0000 | Freq: Once | Status: DC
  Filled 2012-02-05: qty 1

## 2012-02-05 MED ORDER — IPRATROPIUM-ALBUTEROL 18-103 MCG/ACT IN AERO: 2.0000 | INHALATION_SPRAY | Freq: Three times a day (TID) | RESPIRATORY_TRACT | Status: DC

## 2012-02-05 MED ORDER — INSULIN PEN STARTER KIT
1.0000 | Freq: Once | Status: DC
  Filled 2012-02-05: qty 1

## 2012-02-05 MED ORDER — INSULIN PEN STARTER KIT: 1.0000 | Freq: Once | Status: DC

## 2012-02-05 MED ORDER — INSULIN ASPART 100 UNIT/ML ~~LOC~~ SOLN: 0.0000 [IU] | SUBCUTANEOUS | Status: DC

## 2012-02-05 MED ORDER — WARFARIN SODIUM 5 MG PO TABS
5.0000 mg | ORAL_TABLET | Freq: Once | ORAL | Status: AC
  Administered 2012-02-05: 5 mg via ORAL
  Filled 2012-02-05: qty 1

## 2012-02-05 MED ORDER — METOPROLOL TARTRATE 25 MG PO TABS: 25.0000 mg | ORAL_TABLET | Freq: Two times a day (BID) | ORAL | Status: DC

## 2012-02-05 NOTE — Discharge Instructions (Addendum)
Heart Failure Heart failure happens when the heart muscle does not work normally. This means your heart does not pump blood efficiently for your body to work well. This may cause blood to back up into the lungs or cause your lower legs to swell. Heart failure is a long-term (chronic) condition. It is important for you to take good care of yourself and follow your caregiver's treatment plan.  SYMPTOMS   Shortness of breath with mild exercise or while at rest.   A persistent cough.   Abnormal swelling in the feet and legs.   Unexplained sudden weight gain.   Fatigue and loss of energy.   Feeling lightheaded or close to fainting.   Chest or abdominal pain.  CAUSES   Coronary artery disease.   High blood pressure.   Heart attacks.   Abnormal heart valves.   Lung disease.   Diabetes.  MEDICAL EVALUATION MAY REQUIRE:  Electrocardiogram (EKG).   Chest X-ray.   Blood tests.   Exercise stress test.   Echocardiogram.   Angiogram.   Radionuclide ventriculography.  The symptoms of heart failure can improve with proper treatment. Medications, lifestyle changes, or surgical intervention may be necessary to treat heart failure. Heart medications prescribed include those that lower blood pressure, strengthen contractility, and improve circulation. A diuretic or "water pill" may also be prescribed so excess fluid can be removed from the body. Surgical intervention may include procedures that open blocked arteries or repair damaged heart valves. In some cases, a pacemaker is need to help the heart pump better. Lifestyle changes include quitting smoking, eating a healthy diet, limiting salt intake and limiting alcoholic drinks. Exercise as told by your caregiver.  Salt increases fluid retention and should be avoided. Stay away from food made with a lot of salt. For example, stay away from chips, pretzels, pickles, olives, and canned foods. Eat a 1500 milligram salt (sodium) diet or as told  by your doctor. Weigh yourself each morning after you urinate but before you eat breakfast. Keep a record of your weight to show your caregiver. A sudden weight gain can mean fluid buildup. Tell your caregiver if you gain 3 or more pounds (1.4 kilograms) in a day or 5 pounds (2.3 kilograms) in a week.  Make a list of every medicine, vitamin, or herbal supplement you are taking. Keep the list with you at all times. Show it to your caregiver at every visit. Keep the list up-to-date. Ask your caregiver or pharmacist to write an explanation of each medicine you are taking. SEEK IMMEDIATE MEDICAL CARE IF:  You develop increased breathing, shortness of breath, severe cough or are coughing up blood.   You have severe chest or abdominal pain, or unusual heart palpitations.   You develop weakness or numbness in your face or on one side of the body.  Document Released: 01/02/2005 Document Revised: 08/07/2011 Document Reviewed: 07/07/2008 [facility] Patient Information 2012 [place], [place].Warfarin, Questions and Answers Warfarin (Coumadin) is a prescription medication that delays normal blood clotting. This is called an "anticoagulant" or "blood thinner." These medications do not actually cause the blood to become less thick, only less likely to clot. Normally, when body tissues are cut or damaged, the blood clots in order to prevent blood loss. Some clots form when they are not supposed to and may travel through the bloodstream and become lodged in smaller blood vessels. Blockage of blood flow can cause serious problems including a stroke (when a blood vessel leading to a portion of the  brain is blocked) or a heart attack (when a blood vessel leading to the heart is blocked). WHO SHOULD USE WARFARIN?  Warfarin is prescribed for individuals who have a risk for developing harmful blood clots. People at risk include individuals with a mechanical heart valve, individuals with the irregular heart rhythm called  atrial fibrillation, and individuals with certain clotting disorders.   Warfarin is also used in individuals who have a history of developing harmful clots, including individuals who have had a stroke, heart attack, a clot which has traveled to the lung (pulmonary embolism), or a blood clot in the leg (deep venous thrombosis or DVT).   Warfarin may be used to prevent the growth of an existing clot, such as a DVT.  HOW IS THE EFFECT OF WARFARIN MONITORED? The goal of warfarin therapy is to lessen the clotting tendency of blood, but not to prevent clotting completely. Therefore, the anticoagulation effect of warfarin must be carefully watched using laboratory blood tests.  The test most commonly used to measure the effects of warfarin is the prothrombin time ("protime", or PT).   The PT is also used to compute a value known as the International Normalized Ratio (INR).   The longer it takes the blood to clot, the higher the PT and INR. Your caregiver will tell you what your "target" INR range is. In most cases, the target range will be 2 to 3, although other ranges may be chosen. If, at any time, your INR is below the target range, there is a risk of clotting. If, on the other hand, your INR is above the target range, there is an increased risk of bleeding.  When warfarin is first prescribed, a higher "loading" dose may be given so that an effective blood level of the medication is reached quickly. The loading dose is then adjusted downward until a maintenance dose (a dose which is enough to keep the INR where it should be) is reached. Once you are on a stable maintenance dose, the PT and INR are watched less often, usually once every 2 to 4 weeks. The warfarin dose may be changed at times in response to a changing INR or to medical changes that call for an increase or decrease in warfarin therapy. It is important to keep all laboratory and caregiver follow-up appointments! Not keeping appointments could  result in a chronic or permanent injury, pain, or disability. WHAT ARE THE SIDE EFFECTS OF WARFARIN?  Excessive blood thinning can cause bleeding (hemorrhage) from any part of the body. Individuals on warfarin should report any falls or accidents, or any symptoms of bleeding or unusual bruising. Signs of unusual bleeding may include bleeding from the gums, blood in the urine, bloody or dark stool, a nosebleed, coughing up blood, or vomiting blood. Because the risk of bleeding increases as the INR rises above the desired limit, the INR is closely watched during warfarin therapy. Adjustments are made as needed to keep the INR at an acceptable level (within the target range).   Other body systems can be affected by warfarin. In addition to any signs of bleeding, patients should report skin rash or irritation, unusual fever, continual nausea or stomach upset, severe pain in the joints or back, swelling or pain at an injection site, or symptoms of a stroke.  IS WARFARIN SAFE TO USE DURING PREGNANCY?  Warfarin is generally not advised during pregnancy, at least during the first trimester, due to an increased risk of birth defects. A woman who becomes  pregnant or plans to become pregnant while on warfarin therapy should notify the caregiver immediately.   Although warfarin does not pass into breast milk, a woman who wishes to breastfeed while taking warfarin should also consult with her caregiver.  ARE THERE SPECIAL PRECAUTIONS TO FOLLOW WHILE TAKING WARFARIN? Follow the dosage schedule carefully. Warfarin should be taken exactly as directed. A missed or extra dose requires a call to the prescribing caregiver for advice. Do not change the dose of warfarin on your own to make up for missed or extra doses. It is very important to take warfarin as directed since bleeding or blood clots could result in chronic or permanent injury, pain, or disability. Warfarin tablets come in different strengths. Each tablet  strength is usually a different color, with the amount of warfarin (in milligrams) clearly printed on the tablet. You should immediately report to the pharmacist or caregiver any prescription which is different than the previous one, to make sure that you are taking the correct dose. Avoid situations that cause bleeding. You may have a tendency to bleed more easily than usual while taking warfarin. Some simple changes which can limit bleeding are:  Use a softer toothbrush.   Floss with waxed floss rather than unwaxed floss.   Shave with an electric razor rather than a blade.   Limit use of sharp objects.   Avoid potentially harmful activities (such as contact sports).  Medical conditions. Medical conditions which increase your clotting time include:  Diarrhea.   Worsening of heart failure.   Fever.   Worsening of liver function.  Alcohol use. Chronic use of alcohol affects the body's ability to handle warfarin. You should avoid alcohol or consume it only in very limited quantities. General alcohol intake guidelines are 1 drink for women and 2 drinks for men per day. (1 drink = 5 ounces of wine, 12 ounces of beer or 1 ounces of liquor). Other medications. A number of drugs can interact with warfarin. Some of the most common over-the-counter pain relievers (acetaminophen, aspirin, and nonsteroidal anti-inflammatory medications) make the anticoagulant effects of warfarin stronger. If these medicines are taken, your caregiver may need to adjust the dose of warfarin. Talk to your caregiver if you have problems or questions. Dietary considerations. Some foods and supplements may interfere with warfarin's effectiveness. Examples appear below. Once you are stabilized on a warfarin regimen, no major dietary changes should be made without consulting your caregiver. Vitamin K. Foods high in vitamin K can cause warfarin to be less effective. You should avoid eating very large amounts of foods known to  be high in vitamin K. The serving size for foods high in vitamin K are  cup cooked and 1 cup raw, unless otherwise noted, including:  Beef liver (3.5 ounces).   Garbanzo beans.   Kale.   Spinach.   Nettle greens.   Swiss chard.   Pork liver (3.5 ounces).   Broccoli.   Cabbage.   Watercress.   Soybeans.   Endive.   Green tea (made with  ounce dried tea).   Brussels sprouts.   Cauliflower.   Parsley (1 Tablespoon).   Collard greens.   Seaweed (limit 2 sheets).   Turnip greens.   Soybean oil.   Green peas.  Eating large amounts of these foods may seriously reduce how well a dose of warfarin works. It is not necessary to avoid these foods, but do not change the amount of vitamin K-containing foods you currently eat. Changing to a diet  low in foods containing vitamin K may lead to an excessive warfarin effect. Do not drink herbal teas containing coumarin while taking this medication. IS IT SAFE TO USE SUPPLEMENTS WHILE TAKING WARFARIN?  Vitamin E. Vitamin E may increase the anticoagulant effects of warfarin. You should consult your caregiver before adding or changing a dose of vitamin E or any other vitamin.   Check other supplements such as multivitamins and calcium supplements. These may contain vitamin K.  FOR MORE INFORMATION Your caregiver is the best resource for important information related to your particular case. Because every person is different, it is important that your situation is looked at by someone who knows you as a whole person and also knows the specifics of your condition. Document Released: 11/25/2005 Document Revised: 08/07/2011 Document Reviewed: 02/17/2007 [facility] Patient Information 2012 [place], [place].

## 2012-02-05 NOTE — Progress Notes (Signed)
ANTICOAGULATION CONSULT NOTE - Follow Up Consult  Pharmacy Consult for coumadin Indication: atrial fibrillation  Allergies  Allergen Reactions  . Daptomycin Other (See Comments)    Elevated CPK  . Morphine And Related Nausea And Vomiting  . Keflex Rash    unknown  . Celecoxib     unknown  . Codeine     unknown  . Fluoxetine Hcl     unknown  . Latex     REACTION: undefined  . Ofloxacin     unknown  . Penicillins     unknown  . Rofecoxib     unknown  . Sulfonamide Derivatives     unknown    Patient Measurements: Height: 5\' 3"  (160 cm) Weight: 262 lb 5.6 oz (119 kg) IBW/kg (Calculated) : 52.4  Heparin Dosing Weight:   Vital Signs: Temp: 97.9 F (36.6 C) (02/27 0500) Temp src: Oral (02/27 0500) BP: 178/49 mmHg (02/27 0500) Pulse Rate: 81  (02/27 0500)  Labs:  Basename 02/05/12 0600 02/04/12 0535 02/03/12 0605  HGB 10.0* 9.4* --  HCT 33.0* 31.8* 31.0*  PLT 288 271 294  APTT -- -- --  LABPROT 30.5* 32.1* 27.7*  INR 2.87* 3.06* 2.53*  HEPARINUNFRC -- -- --  CREATININE -- -- --  CKTOTAL -- -- --  CKMB -- -- --  TROPONINI -- -- --   Estimated Creatinine Clearance: 31.1 ml/min (by C-G formula based on Cr of 2.37).   Assessment: Patient is a 61 y.o F on coumadin for Afib.  INR is therapeutic today.  No bleeding noted.  Goal of Therapy:  INR 2-3   Plan:  1) Coumadin 5mg  po x1  [name] P 02/05/2012,9:51 AM

## 2012-02-05 NOTE — Progress Notes (Signed)
D/C instructions went over. Pt has no concerns. pt going home with insulin pen starter kit and prescription from Dr. [name]. Pt discharging to home by wheel chair and private vehicle getting [facility] RN PT. CPAP at home. V/S stable.

## 2012-02-05 NOTE — Progress Notes (Signed)
02/05/12 1600 New orders for DME rolling walker and CPAP.  Spoke with pt. about when she received CPAP and she stated 1 year ago.  Explained to pt. that insurance will only cover new DME every 5years.  She understood and wanted another CPAP, she stated her machine had mildew and someone discarded machine.  TC to [employer] [phone]) to make referral for DME.  Faxed facesheet, orders for DME rolling walker, and CPAP to ([phone]).  TC to [facility] with [facility] to give referral for [facility] RN, HHPT, HH CSW.  Order placed in [facility]. Tera Mater, RN, BSN [phone]

## 2012-02-05 NOTE — Progress Notes (Signed)
Physical Therapy Treatment Patient Details Name: [name] MRN: [id number] DOB: 01/04/1951 Today's Date: 02/05/2012  PT Assessment/Plan  PT - Assessment/Plan Comments on Treatment Session: The patient is progressing well with mobility.  only needs supervision for longer distance gait.  Increased DOE with gait, but O2 sats remained 90-93% on [employer] with walking.  DOE 2/4.  Gait speed is indicative of fall risk.   PT Plan: Discharge plan remains appropriate;Frequency remains appropriate PT Frequency: Min 3X/week Follow Up Recommendations: Home health PT;Supervision - Intermittent Equipment Recommended: None recommended by PT PT Goals  Acute Rehab PT Goals Pt will go Sit to Stand: with modified independence PT Goal: Sit to Stand - Progress: Met Pt will go Stand to Sit: with modified independence PT Goal: Stand to Sit - Progress: Met Pt will Ambulate: with modified independence;>150 feet;with rolling walker PT Goal: Ambulate - Progress: Updated due to goal met Pt will Go Up / Down Stairs: with supervision;with rail(s);1-2 stairs PT Goal: Up/Down Stairs - Progress: Updated due to goal met  PT Treatment Precautions/Restrictions  Precautions Precautions: Fall Precaution Comments: O2 Restrictions Weight Bearing Restrictions: No Mobility (including Balance) Transfers Sit to Stand: 6: Modified independent (Device/Increase time);With armrests;From chair/3-in-1;From toilet;With upper extremity assist Sit to Stand Details (indicate cue type and reason): multiple times from high and low surfaces Stand to Sit: 6: Modified independent (Device/Increase time);With armrests;To toilet;To chair/3-in-1;With upper extremity assist Stand to Sit Details: multiple times to high and low surfaces.   Ambulation/Gait Ambulation/Gait Assistance: 5: Supervision Ambulation/Gait Assistance Details (indicate cue type and reason): supervision secondary to increased DOE, but otherwise, no LOB, just slow  gait.  Ambulation Distance (Feet): 160 Feet Assistive device: Rolling walker Gait Pattern: Shuffle Gait velocity: 0.76 ft/sec (<1.8 ft/sec puts patient at risk for recurrent falls Stairs: No (practiced yesterday)    Exercise  General Exercises - Upper Extremity Shoulder Flexion: AROM;Both;10 reps Elbow Flexion: AROM;Both;10 reps General Exercises - Lower Extremity Long Arc Quad: AROM;Both;10 reps Hip Flexion/Marching: AROM;Both;10 reps Toe Raises: AROM;Both;20 reps Heel Raises: AROM;Both;Other reps (comment) (20 reps) End of Session PT - End of Session Activity Tolerance: Patient limited by fatigue Patient left: in chair;with call bell in reach General Behavior During Session: [facility] for tasks performed Cognition: [facility] for tasks performed  Aren Pryde B. Lithzy Bernard, PT, DPT [phone] 02/05/2012, 10:23 AM

## 2012-02-05 NOTE — Progress Notes (Signed)
Inpatient Diabetes Program Recommendations  AACE/ADA: New Consensus Statement on Inpatient Glycemic Control (2009)  Target Ranges:  Prepandial:   less than 140 mg/dL      Peak postprandial:   less than 180 mg/dL (1-2 hours)      Critically ill patients:  140 - 180 mg/dL   Results for SHAKARA, TWEEDY (MRN [id number]) as of 02/05/2012 09:25  Ref. Range 02/04/2012 07:28 02/04/2012 11:55 02/04/2012 17:14 02/04/2012 20:43  Glucose-Capillary Latest Range: 70-99 mg/dL 914 (H) 782 (H) 956 (H) 219 (H)    Inpatient Diabetes Program Recommendations Insulin - Basal: Please start patient's home Lantus dose- Lantus 10 units QHS.  Note: Will follow. Ambrose Finland RN, MSN, CDE Diabetes Coordinator Inpatient Diabetes Program [phone]

## 2012-02-17 ENCOUNTER — Telehealth: Payer: Self-pay | Admitting: Internal Medicine

## 2012-02-17 NOTE — Telephone Encounter (Signed)
Dr. Delena Bali, have you seen a fax for this pt on cpap?  Pls advise.  Thank you.

## 2012-02-18 NOTE — Telephone Encounter (Signed)
CY just signed today and I have faxed this back already.

## 2012-02-21 ENCOUNTER — Other Ambulatory Visit: Payer: Self-pay | Admitting: Internal Medicine

## 2012-02-21 DIAGNOSIS — Z1231 Encounter for screening mammogram for malignant neoplasm of breast: Secondary | ICD-10-CM

## 2012-02-24 ENCOUNTER — Telehealth: Payer: Self-pay | Admitting: Pulmonary Disease

## 2012-02-24 NOTE — Telephone Encounter (Signed)
Sleep Study faxed to [facility] ([place] [facility]) to [phone]. Rhonda J Cobb

## 2012-02-26 ENCOUNTER — Encounter: Payer: Self-pay | Admitting: Internal Medicine

## 2012-03-02 ENCOUNTER — Encounter: Payer: Self-pay | Admitting: Infectious Disease

## 2012-03-02 ENCOUNTER — Ambulatory Visit (INDEPENDENT_AMBULATORY_CARE_PROVIDER_SITE_OTHER): Payer: Medicare HMO | Admitting: Infectious Disease

## 2012-03-02 VITALS — BP 181/69 | HR 52 | Temp 97.5°F

## 2012-03-02 DIAGNOSIS — S91009A Unspecified open wound, unspecified ankle, initial encounter: Secondary | ICD-10-CM

## 2012-03-02 DIAGNOSIS — T8450XA Infection and inflammatory reaction due to unspecified internal joint prosthesis, initial encounter: Secondary | ICD-10-CM

## 2012-03-02 DIAGNOSIS — S81819A Laceration without foreign body, unspecified lower leg, initial encounter: Secondary | ICD-10-CM | POA: Insufficient documentation

## 2012-03-02 NOTE — Assessment & Plan Note (Signed)
Continue suppressive antibiotics, check esr, crp keeping in mind chance that her injury or her gout could falsely elevate these markers

## 2012-03-02 NOTE — Patient Instructions (Signed)
IF POSSIBLE ONE WEEK FOLLOW UP WITH ID CLINIC MD

## 2012-03-02 NOTE — Progress Notes (Signed)
Subjective:    Patient ID: [name], female    DOB: 1951/05/06, 61 y.o.   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   She was admitted on the 24th of June  2012 with 2 days of worsening left lower extremity pain and found to have a septic joint with  147,697 white blood cells with 67% neutrophils and culture that grew Group B streptococci R to clindamycin. (she also did have gout crystals in the joint) She was in renal failure and changed to daptomycin but then had high CPK and was switched back to vancomycin. She had high ESR of 74. She NEVER had washout of joint At that time or exchange arthroplasty but she  DID have serial aspirations of the joint.   She finished the IV vancomycin and then within 2 weeks of stopping this  had recurrence of knee pain and felt like at "ton of bricks had fallen on her leg"  She was admitted to the hospital this fall with fevers, malaise, and atrial fibrillation and RVR. [name] Her blood cultures were drawn adn failed to grown any organism. She was started on IV vancomycin. Dr. Jerl Santos from orthopedic surgery saw the patient at that time. The knee was not aspirated during that visit. Her knee pain improved. We discussed options of removing the prostheitc knee joint. She was referred to [facility] where she has since seen a orthopedic surgeon there.THe patient tells me that she was offered only a below an above-the-knee amputation which she did not wish for. She has been continued on levaquin by me since then. Today she again  presents for followup.  Last night she struck her right calf and it bled and the area is now very tender to palpation and she wonders if "i might need an mri.:' She is currently without fevers, chills, nausea or malaise. The pain in the opposite joint is stable and not worsened since I last saw her.  Review of Systems  Constitutional: Negative for fever, chills, diaphoresis, activity change, appetite change, fatigue and unexpected weight change.  HENT: Negative for congestion, sore throat, rhinorrhea, sneezing, trouble swallowing and sinus pressure.   Eyes: Negative for photophobia and visual disturbance.  Respiratory: Negative for cough, chest tightness, shortness of breath, wheezing and stridor.   Cardiovascular: Negative for chest pain, palpitations and leg swelling.  Gastrointestinal: Negative for nausea, vomiting, abdominal pain, diarrhea, constipation, blood in stool, abdominal distention and anal bleeding.  Genitourinary: Negative for dysuria, hematuria, flank pain and difficulty urinating.  Musculoskeletal: Positive for myalgias, joint swelling, arthralgias and gait problem. Negative for back pain.  Skin: Positive for color change and wound. Negative for pallor and rash.  Neurological: Negative for dizziness, tremors, weakness and light-headedness.  Hematological: Negative for adenopathy. Does not bruise/bleed easily.  Psychiatric/Behavioral: Negative for behavioral problems, confusion, sleep disturbance, dysphoric mood, decreased concentration and agitation.       Objective:   Physical Exam  Constitutional: She is oriented to person, place, and time. She appears well-developed and well-nourished. No distress.  HENT:  Head: Normocephalic and atraumatic.  Mouth/Throat: Oropharynx is clear and moist. No oropharyngeal  exudate.  Eyes: Conjunctivae and EOM are normal. Pupils are equal, round, and reactive to light. No scleral icterus.  Neck: Normal range of motion. Neck supple. No JVD  present.  Cardiovascular: Normal rate, regular rhythm and normal heart sounds.  Exam reveals no gallop and no friction rub.   No murmur heard. Pulmonary/Chest: Effort normal and breath sounds normal. No respiratory distress. She has no wheezes. She has no rales. She exhibits no tenderness.  Abdominal: She exhibits no distension and no mass. There is no tenderness. There is no rebound and no guarding.  Musculoskeletal: She exhibits no edema and no tenderness.       Legs: Lymphadenopathy:    She has no cervical adenopathy.  Neurological: She is alert and oriented to person, place, and time. She has normal reflexes. She exhibits normal muscle tone. Coordination normal.  Skin: Skin is warm and dry. She is not diaphoretic. There is erythema. No pallor.  Psychiatric: She has a normal mood and affect. Her behavior is normal. Judgment and thought content normal.          Assessment & Plan:  Laceration of leg Topical antibiotics, elevate the leg rtc to see ID clinic MD next week for followup to make sure no infection fluorishing at this site  Prosthetic joint infection Continue suppressive antibiotics, check esr, crp keeping in mind chance that her injury or her gout could falsely elevate these markers

## 2012-03-02 NOTE — Assessment & Plan Note (Signed)
Topical antibiotics, elevate the leg rtc to see ID clinic MD next week for followup to make sure no infection fluorishing at this site

## 2012-03-03 LAB — CBC WITH DIFFERENTIAL/PLATELET
Basophils Relative: 0 % (ref 0–1)
Eosinophils Absolute: 0.1 10*3/uL (ref 0.0–0.7)
MCH: 26.8 pg (ref 26.0–34.0)
MCHC: 30.1 g/dL (ref 30.0–36.0)
Neutro Abs: 4.7 10*3/uL (ref 1.7–7.7)
Neutrophils Relative %: 77 % (ref 43–77)
Platelets: 175 10*3/uL (ref 150–400)
RBC: 3.99 MIL/uL (ref 3.87–5.11)

## 2012-03-03 LAB — BASIC METABOLIC PANEL WITH GFR
BUN: 51 mg/dL — ABNORMAL HIGH (ref 6–23)
Calcium: 9 mg/dL (ref 8.4–10.5)
GFR, Est African American: 20 mL/min — ABNORMAL LOW
GFR, Est Non African American: 17 mL/min — ABNORMAL LOW
Glucose, Bld: 123 mg/dL — ABNORMAL HIGH (ref 70–99)
Potassium: 4.9 mEq/L (ref 3.5–5.3)
Sodium: 141 mEq/L (ref 135–145)

## 2012-03-03 LAB — C-REACTIVE PROTEIN: CRP: 1.28 mg/dL — ABNORMAL HIGH (ref <0.60)

## 2012-03-04 ENCOUNTER — Telehealth: Payer: Self-pay | Admitting: *Deleted

## 2012-03-04 NOTE — Telephone Encounter (Signed)
Legs continue to swell.  RN advised elevation of legs as much as possible and monitoring salt intake.  Pt reminded RN that she also has CHF.  Pt verbalized understanding of need to monitor salt intake and elevating legs a much as possible.  Pt has f/u appt w/ Dr. Orvan Falconer 03/09/12.

## 2012-03-09 ENCOUNTER — Ambulatory Visit (INDEPENDENT_AMBULATORY_CARE_PROVIDER_SITE_OTHER): Payer: Medicare HMO | Admitting: Internal Medicine

## 2012-03-09 ENCOUNTER — Encounter: Payer: Self-pay | Admitting: Internal Medicine

## 2012-03-09 VITALS — BP 191/72 | HR 48 | Temp 97.7°F | Ht 63.0 in | Wt 265.2 lb

## 2012-03-09 DIAGNOSIS — I872 Venous insufficiency (chronic) (peripheral): Secondary | ICD-10-CM

## 2012-03-09 DIAGNOSIS — T8450XA Infection and inflammatory reaction due to unspecified internal joint prosthesis, initial encounter: Secondary | ICD-10-CM

## 2012-03-09 DIAGNOSIS — S81819A Laceration without foreign body, unspecified lower leg, initial encounter: Secondary | ICD-10-CM

## 2012-03-09 DIAGNOSIS — S91009A Unspecified open wound, unspecified ankle, initial encounter: Secondary | ICD-10-CM

## 2012-03-09 NOTE — Progress Notes (Signed)
Patient ID: [name], female   DOB: [birth year], 61 y.o.   MRN: [id number]  INFECTIOUS DISEASE PROGRESS NOTE    Subjective: Mrs. Rizo is in for her followup visit. She is followed by my partner, Dr. Daiva Eves, for chronic left prosthetic knee infection. She has been on chronic suppressive levofloxacin without difficulty. She also has chronic venous insufficiency. She bumped her right shin recently and has been having problem with increased swelling and pain in her right lower leg. She was seen by Dr. Daiva Eves last week who noted some purulent drainage from the one lesion in her right leg and recommended topical antibiotics. She has not had any change in the swelling but notes no further drainage. She has not had any fever. She notes that her weight is up 4-5 pounds over the last 2 weeks. She states that she is supposed to weigh daily but she does not know if she is supposed to change any of her medications if her weight increases. She is due to see her primary care doctor, Dr. Willey Blade, next week.  Objective: Temp: 97.7 F (36.5 C) (04/01 1525) Temp src: Oral (04/01 1525) BP: 191/72 mmHg (04/01 1525) Pulse Rate: 48  (04/01 1525)  General: She appears worried but in no other distress Skin: She has a healed incision over her left knee. She has chronic venous stasis dermatitis of both lower extremities. There are no open, draining lesions on her right leg. Her right leg is more swollen than her left. Both legs are edematous with some mild erythema. Her right leg is not any warmer than her left.    Assessment: It appears that the open, draining lesion of her right leg has improved significantly. I suspect that the residual swelling and mild redness is due to venous insufficiency rather than cellulitis. She states that she cannot tolerate compressive stockings. I would recommend that she followup with Dr. [name] next week. I will continue her chronic suppressive levofloxacin for her prosthetic  joint infection but would not add any further antibiotic therapy at this time. I do not see the need for any further diagnostic testing at this time.  Plan: 1. Medical management for acute on chronic venous insufficiency 2. Continue levofloxacin 3. Followup in one month   Cliffton Asters, MD [facility] [phone] pager   [phone] cell 03/09/2012, 3:49 PM

## 2012-03-10 ENCOUNTER — Emergency Department (HOSPITAL_COMMUNITY): Payer: Medicare HMO

## 2012-03-10 ENCOUNTER — Emergency Department (HOSPITAL_COMMUNITY)
Admission: EM | Admit: 2012-03-10 | Discharge: 2012-03-10 | Disposition: A | Discharge status: Home / Self Care | Payer: Medicare HMO | Attending: Emergency Medicine | Admitting: Emergency Medicine

## 2012-03-10 DIAGNOSIS — Z8673 Personal history of transient ischemic attack (TIA), and cerebral infarction without residual deficits: Secondary | ICD-10-CM | POA: Insufficient documentation

## 2012-03-10 DIAGNOSIS — M7989 Other specified soft tissue disorders: Secondary | ICD-10-CM | POA: Insufficient documentation

## 2012-03-10 DIAGNOSIS — I872 Venous insufficiency (chronic) (peripheral): Secondary | ICD-10-CM | POA: Insufficient documentation

## 2012-03-10 DIAGNOSIS — R609 Edema, unspecified: Secondary | ICD-10-CM | POA: Insufficient documentation

## 2012-03-10 DIAGNOSIS — Z794 Long term (current) use of insulin: Secondary | ICD-10-CM | POA: Insufficient documentation

## 2012-03-10 DIAGNOSIS — I10 Essential (primary) hypertension: Secondary | ICD-10-CM | POA: Insufficient documentation

## 2012-03-10 DIAGNOSIS — L988 Other specified disorders of the skin and subcutaneous tissue: Secondary | ICD-10-CM | POA: Insufficient documentation

## 2012-03-10 DIAGNOSIS — E119 Type 2 diabetes mellitus without complications: Secondary | ICD-10-CM | POA: Insufficient documentation

## 2012-03-10 DIAGNOSIS — Z7901 Long term (current) use of anticoagulants: Secondary | ICD-10-CM | POA: Insufficient documentation

## 2012-03-10 LAB — PROTIME-INR
INR: 1.2 (ref 0.00–1.49)
Prothrombin Time: 15.5 seconds — ABNORMAL HIGH (ref 11.6–15.2)

## 2012-03-10 LAB — DIFFERENTIAL
Basophils Absolute: 0 10*3/uL (ref 0.0–0.1)
Lymphocytes Relative: 13 % (ref 12–46)
Neutro Abs: 5.7 10*3/uL (ref 1.7–7.7)
Neutrophils Relative %: 77 % (ref 43–77)

## 2012-03-10 LAB — CBC
Platelets: 190 10*3/uL (ref 150–400)
RBC: 3.63 MIL/uL — ABNORMAL LOW (ref 3.87–5.11)
RDW: 19.6 % — ABNORMAL HIGH (ref 11.5–15.5)
WBC: 7.4 10*3/uL (ref 4.0–10.5)

## 2012-03-10 LAB — POCT I-STAT, CHEM 8
BUN: 70 mg/dL — ABNORMAL HIGH (ref 6–23)
Calcium, Ion: 1.14 mmol/L (ref 1.12–1.32)
Chloride: 112 mEq/L (ref 96–112)
Creatinine, Ser: 2.8 mg/dL — ABNORMAL HIGH (ref 0.50–1.10)
Glucose, Bld: 163 mg/dL — ABNORMAL HIGH (ref 70–99)
HCT: 31 % — ABNORMAL LOW (ref 36.0–46.0)
Hemoglobin: 10.5 g/dL — ABNORMAL LOW (ref 12.0–15.0)
Potassium: 4.5 meq/L (ref 3.5–5.1)
Sodium: 143 mEq/L (ref 135–145)
TCO2: 23 mmol/L (ref 0–100)

## 2012-03-10 LAB — APTT: aPTT: 37 seconds (ref 24–37)

## 2012-03-10 NOTE — ED Notes (Signed)
Made nurse aware of the heartrate 

## 2012-03-10 NOTE — ED Provider Notes (Signed)
History     CSN: [id number]  Arrival date & time 03/10/12  1610   First MD Initiated Contact with Patient 03/10/12 2007      No chief complaint on file.   (Consider location/radiation/quality/duration/timing/severity/associated sxs/prior treatment) HPI Comments: Patient with vascular insufficiency of her lower legs has a chronic infection on her left lower leg.  That is being treated by infectious disease, whom she saw yesterday, but she is also reporting that for the past 3 days.  Her right shin has had an area of redness, increased swelling, and a patch approximately 10 cm x 5 cm on the anterior shin.  There is increased in size, tenderness, and it appears blisterlike.  It is not oozing.   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    Past Medical History  Diagnosis Date  . Asthma   . Bronchitis   . Allergic rhinitis   . Diabetes mellitus   . Stroke   . HTN (hypertension)   . OSA (obstructive sleep apnea)   . DJD (degenerative joint disease)   . Kidney stones   . Depression   . Hyperlipidemia   . Complication of anesthesia     trouble waking up    Past Surgical History  Procedure Date  . Appendectomy   . Cholecystectomy   . Total knee arthroplasty   . Total abdominal hysterectomy   . Back surgery   . Cystectomy     left hand    Family History  Problem Relation Age of Onset  . Heart attack Father   . Asthma Father   . Heart disease Paternal Uncle   . Rectal cancer Paternal Aunt   . Other Mother     mva    History  Substance Use Topics  . Smoking status: Former Smoker    Types: Cigarettes  . Smokeless tobacco: Never Used   Comment: smoked for 1.5 yrs about 2 cigs a day ,only if stressed  . Alcohol Use: No    OB History    Grav Para Term Preterm Abortions TAB SAB Ect Mult Living                  Review of Systems  Constitutional: Negative for fever.  Musculoskeletal: Positive for  joint swelling. Negative for myalgias.  Skin: Positive for color change and wound.    Allergies  Daptomycin; Morphine and related; Keflex; Celecoxib; Codeine; Fluoxetine hcl; Latex; Ofloxacin; Penicillins; Rofecoxib; and Sulfonamide derivatives  Home Medications   Current Outpatient Rx  Name Route Sig Dispense Refill  . IPRATROPIUM-ALBUTEROL 18-103 MCG/ACT IN AERO Inhalation Inhale 2 puffs into the lungs 3 (three) times daily. 1 Inhaler 4  . ALLOPURINOL 100 MG PO TABS Oral Take 1 tablet by mouth daily.    . AMIODARONE HCL 200 MG PO TABS Oral Take 200 mg by mouth daily.    . ASPIRIN 81 MG PO TABS Oral Take 81 mg by mouth daily.      . ATORVASTATIN CALCIUM 20 MG PO TABS Oral Take 20 mg by mouth at bedtime.    [name] CLOBETASOL PROPIONATE 0.05 % EX OINT Topical Apply 1 application topically daily. Use as directed on legs    . DARBEPOETIN ALFA-POLYSORBATE 200 MCG/0.4ML IJ SOLN Subcutaneous Inject 0.4 mLs (200 mcg total) into the skin every Monday at 6 PM. 1.68 mL 3  . DIGOXIN 0.125 MG PO TABS Oral Take 1 tablet  by mouth daily.    [name] DILTIAZEM HCL ER 180 MG PO CP24 Oral Take 180 mg by mouth daily.    [name] FLEXPEN STARTER KIT Other 1 kit by Other route once. 1 each 3  . FLUTICASONE PROPIONATE  HFA 220 MCG/ACT IN AERO Inhalation Inhale 2 puffs into the lungs 3 (three) times daily.      . FUROSEMIDE 80 MG PO TABS Oral Take 2 tablets (160 mg total) by mouth 2 (two) times daily. 120 tablet 3  . INSULIN ASPART 100 UNIT/ML Guadalupe Guerra SOLN Subcutaneous Inject 0-20 Units into the skin every 4 (four) hours. 1 pen 3  . LANTUS 100 UNIT/ML  SOLN Subcutaneous Inject 10 Units into the skin at bedtime. 10 units at bedtime    . LEVOFLOXACIN 500 MG PO TABS Oral Take 1 tablet (500 mg total) by mouth daily. 30 tablet 3  . LEVOFLOXACIN 500 MG PO TABS Oral Take 1 tablet (500 mg total) by mouth daily. 30 tablet 5  . LEVOFLOXACIN 500 MG PO TABS Oral Take 500 mg by mouth daily.    [name] METOPROLOL TARTRATE 25 MG PO TABS Oral Take 1  tablet (25 mg total) by mouth 2 (two) times daily. 60 tablet 3  . PANTOPRAZOLE SODIUM 40 MG PO TBEC Oral Take 40 mg by mouth daily.    [name] SPIRONOLACTONE 25 MG PO TABS Oral Take 25 mg by mouth daily.      . WARFARIN SODIUM 5 MG PO TABS Oral Take 5 mg by mouth daily.    [name] ZOLPIDEM TARTRATE 5 MG PO TABS Oral Take 5 mg by mouth at bedtime as needed. For sleep      BP 151/67  Pulse 46  Temp(Src) 97.7 F (36.5 C) (Oral)  Resp 16  SpO2 98%  Physical Exam  Constitutional: She appears well-developed and well-nourished.  HENT:  Head: Normocephalic.  Eyes: Pupils are equal, round, and reactive to light.  Neck: Normal range of motion.  Pulmonary/Chest: Effort normal.  Musculoskeletal: Normal range of motion. She exhibits edema and tenderness.  Neurological: She is alert.  Skin:     Psychiatric: She has a normal mood and affect.    ED Course  Procedures (including critical care time)   Labs Reviewed  CBC  DIFFERENTIAL  APTT   No results found.   No diagnosis found.    MDM  Venous insufficiency        Arman Filter, NP 03/10/12 2301

## 2012-03-10 NOTE — ED Provider Notes (Signed)
Medical screening examination/treatment/procedure(s) were performed by non-physician practitioner and as supervising physician I was immediately available for consultation/collaboration.   Gavin Pound. Catalena Stanhope, MD 03/10/12 2308

## 2012-03-10 NOTE — Discharge Instructions (Signed)
Venous Stasis and Chronic Venous Insufficiency As people age, the veins located in their legs may weaken and stretch. When veins weaken and lose the ability to pump blood effectively, the condition is called chronic venous insufficiency (CVI) or venous stasis. Almost all veins return blood back to the heart. This happens by:  The force of the heart pumping fresh blood pushes blood back to the heart.   Blood flowing to the heart from the force of gravity.  In the deep veins of the legs, blood has to fight gravity and flow upstream back to the heart. Here, the leg muscles contract to pump blood back toward the heart. Vein walls are elastic, and many veins have small valves that only allow blood to flow in one direction. When leg muscles contract, they push inward against the elastic vein walls. This squeezes blood upward, opens the valves, and moves blood toward the heart. When leg muscles relax, the vein wall also relaxes and the valves inside the vein close to prevent blood from flowing backward. This method of pumping blood out of the legs is called the venous pump. CAUSES  The venous pump works best while walking and leg muscles are contracting. But when a person sits or stands, blood pressure in leg veins can build. Deep veins are usually able to withstand short periods of inactivity, but long periods of inactivity (and increased pressure) can stretch, weaken, and damage vein walls. High blood pressure can also stretch and damage vein walls. The veins may no longer be able to pump blood back to the heart. Venous hypertension (high blood pressure inside veins) that lasts over time is a primary cause of CVI. CVI can also be caused by:   Deep vein thrombosis, a condition where a thrombus (blood clot) blocks blood flow in a vein.   Phlebitis, an inflammation of a superficial vein that causes a blood clot to form.  Other risk factors for CVI may include:   Heredity.   Obesity.   Pregnancy.    Sedentary lifestyle.   Smoking.   Jobs requiring long periods of standing or sitting in one place.   Age and gender:   Women in their 40's and 50's and men in their 70's are more prone to developing CVI.  SYMPTOMS  Symptoms of CVI may include:   Varicose veins.   Ulceration or skin breakdown.   Lipodermatosclerosis, a condition that affects the skin just above the ankle, usually on the inside surface. Over time the skin becomes brown, smooth, tight and often painful. Those with this condition have a high risk of developing skin ulcers.   Reddened or discolored skin on the leg.   Swelling.  DIAGNOSIS  Your caregiver can diagnose CVI after performing a careful medical history and physical examination. To confirm the diagnosis, the following tests may also be ordered:   Duplex ultrasound.   Plethysmography (tests blood flow).   Venograms (x-ray using a special dye).  TREATMENT The goals of treatment for CVI are to restore a person to an active life and to minimize pain or disability. Typically, CVI does not pose a serious threat to life or limb, and with proper treatment most people with this condition can continue to lead active lives. In most cases, mild CVI can be treated on an outpatient basis with simple procedures. Treatment methods include:   Elastic compression socks.   Sclerotherapy, a procedure involving an injection of a material that "dissolves" the damaged veins. Other veins in the network   of blood vessels take over the function of the damaged veins.   Vein stripping (an older procedure less commonly used).   Laser Ablation surgery.   Valve repair.  HOME CARE INSTRUCTIONS   Elastic compression socks must be worn every day. They can help with symptoms and lower the chances of the problem getting worse, but they do not cure the problem.   Only take over-the-counter or prescription medicines for pain, discomfort, or fever as directed by your caregiver.    Your caregiver will review your other medications with you.  SEEK MEDICAL CARE IF:   You are confused about how to take your medications.   There is redness, swelling, or increasing pain in the affected area.   There is a red streak or line that extends up or down from the affected area.   There is a breakdown or loss of skin in the affected area, even if the breakdown is small.   You develop an unexplained oral temperature above 102 F (38.9 C).   There is an injury to the affected area.  SEEK IMMEDIATE MEDICAL CARE IF:   There is an injury and open wound to the affected area.   Pain is not adequately relieved with pain medication prescribed or becomes severe.   An oral temperature above 102 F (38.9 C) develops.   The foot/ankle below the affected area becomes suddenly numb or the area feels weak and hard to move.  MAKE SURE YOU:   Understand these instructions.   Will watch your condition.   Will get help right away if you are not doing well or get worse.  Document Released: 03/31/2007 Document Revised: 11/14/2011 Document Reviewed: 06/08/2007 [facility] Patient Information 2012 [place], [place]. Today, your lab work is all normal your x-ray is normal.  Please followup with Dr. [name] as scheduled

## 2012-03-10 NOTE — ED Notes (Signed)
Pain to bilat legs, mainly RT leg x about 3 wks.  Evaluated by ID MD yesterday for recheck and was told to keep leg elevated.  This makes pain worse.  Pt states pain improves to let the leg hang down.  RT leg is swollen.  Pt's HR in triage 46-50.  States she is on meds to make it run low sometimes.  Denies any weakness, shob, dizziness.

## 2012-03-10 NOTE — ED Notes (Signed)
Pt. Received from triage via w/c with c/o right leg swelling x 2 1/2 weeks ago, pt. Hit leg on bathtub, NAD noted

## 2012-03-10 NOTE — Discharge Summary (Signed)
Physician Discharge Summary  Patient ID: [name] MRN: [id number] DOB/AGE: April 06, 1951 61 y.o.  Admit date: 01/21/2012 Discharge date: 02/05/2012   Discharge Diagnoses:  Active Problems:  OBSTRUCTIVE SLEEP APNEA  ATRIAL FIBRILLATION WITH RAPID VENTRICULAR RESPONSE  Acute combined systolic and diastolic heart failure  ASTHMA  DM (diabetes mellitus)  Chronic anticoagulation  Acute combined systolic and diastolic heart failure. Respiratory failure requiring intubation. Obstructive sleep apnea. Asthma. Chronic kidney disease with exacerbation. Chronic left knee infection. Patient with persistent instability. Atrial fibrillation history with rapid ventricular response Morbid obesity with marked deconditioning. Diabetes mellitus. Chronic anticoagulation for atrial fibrillation. Discharged Condition: improved.  Operations/Procedues: intubation.   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 When presented to emergency room she was using her accessory muscles with severe diaphoresis and hypertensive a. Patient required intubation at that time. She was subsequently admitted to the intensive care unit to be managed by the  Critical care team.  Patient was placed on intensive nebulizer therapy with empiric antibiotics as well. She was started on IV Lasix and monitor closely. She is maintained on a Coumadin regimen per pharmacy protocol.patient was started on sliding-scale insulin for blood sugars. Over the ensuing days the patient made slow but steady improvement. She subsequently was extubated. She was eventually transferred to telemetry for continued therapy. During this time she  was followed closely by cardiology as well as nephrology. She was noted to have chronic kidney disease which temporarily worsened while she was hospitalized. She remained anemic secondary to chronic disease. She was transfused one unit bringing her hemoglobin to 9.1  Patient eventually made steady progress such that the physical therapist was reconsulted. It was felt that patient needed further rehabilitation before transitioning back to home. This was discussed with patient and family in depth. Patient however refused to consider a rehabilitation. Efforts thereafter were pursued to maximize patient's present hospital stay prior to discharge. She is lpresently feeling better but with exertional dyspnea. Blood sugars and blood pressure are adequate. She'll need to be monitored closely as an outpatient.   Significant Diagnostic Studies: none  Disposition: 01-Home or Self Care  Discharge Orders    Future Appointments: Provider: Department: Dept Phone: Center:   03/19/2012 10:00 AM [facility] [phone]   04/23/2012 3:30 PM Randall Hiss, MD [facility] [phone] [facility]   05/06/2012 10:30 AM Waymon Budge, MD [facility] [phone] None     Future Orders Please Complete By Expires   Diet - low sodium heart healthy      Increase activity slowly      No wound care      Call MD for:  severe uncontrolled pain      Discharge instructions      Comments:   Follow up with primary care physician in two weeks.     Medication List  As of 03/10/2012 11:51 PM   STOP taking these medications         VENTOLIN HFA 108 (90 BASE) MCG/ACT inhaler         TAKE these medications         albuterol-ipratropium 18-103 MCG/ACT inhaler   Commonly known as: COMBIVENT   Inhale 2 puffs into the lungs 3 (three) times daily.  allopurinol 100 MG tablet   Commonly known as: ZYLOPRIM   Take 1 tablet by mouth daily.      amiodarone 200 MG tablet   Commonly known as: PACERONE     Take 200 mg by mouth daily.      aspirin 81 MG tablet   Take 81 mg by mouth daily.      atorvastatin 20 MG tablet   Commonly known as: LIPITOR   Take 20 mg by mouth at bedtime.      clobetasol ointment 0.05 %   Commonly known as: TEMOVATE   Apply 1 application topically daily. Use as directed on legs      darbepoetin 200 MCG/0.4ML Soln   Commonly known as: ARANESP   Inject 0.4 mLs (200 mcg total) into the skin every Monday at 6 PM.      digoxin 0.125 MG tablet   Commonly known as: LANOXIN   Take 1 tablet by mouth daily.      diltiazem 180 MG 24 hr capsule   Commonly known as: DILACOR XR   Take 180 mg by mouth daily.      fluticasone 220 MCG/ACT inhaler   Commonly known as: FLOVENT HFA   Inhale 2 puffs into the lungs 3 (three) times daily.      furosemide 80 MG tablet   Commonly known as: LASIX   Take 2 tablets (160 mg total) by mouth 2 (two) times daily.      insulin aspart 100 UNIT/ML injection   Commonly known as: novoLOG   Inject 0-20 Units into the skin every 4 (four) hours.      LANTUS 100 UNIT/ML injection   Generic drug: insulin glargine   Inject 10 Units into the skin at bedtime. 10 units at bedtime      levofloxacin 500 MG tablet   Commonly known as: LEVAQUIN   Take 500 mg by mouth daily.      metoprolol tartrate 25 MG tablet   Commonly known as: LOPRESSOR   Take 1 tablet (25 mg total) by mouth 2 (two) times daily.      pantoprazole 40 MG tablet   Commonly known as: PROTONIX   Take 40 mg by mouth daily.      spironolactone 25 MG tablet   Commonly known as: ALDACTONE   Take 25 mg by mouth daily.      warfarin 5 MG tablet   Commonly known as: COUMADIN   Take 5 mg by mouth daily.      zolpidem 5 MG tablet   Commonly known as: AMBIEN   Take 5 mg by mouth at bedtime as needed. For sleep           Follow-up Information    Follow up with [name] Aarin Sparkman, MD .         SignedAugust Saucer, Tyeisha Dinan 03/10/2012, 11:51 PM

## 2012-03-10 NOTE — ED Notes (Signed)
Pt. Discharged to home via w/c, NAD noted,

## 2012-03-19 ENCOUNTER — Ambulatory Visit (HOSPITAL_COMMUNITY)
Admission: RE | Admit: 2012-03-19 | Discharge: 2012-03-19 | Disposition: A | Discharge status: Home / Self Care | Payer: Medicare HMO | Source: Ambulatory Visit | Attending: Internal Medicine | Admitting: Internal Medicine

## 2012-03-19 DIAGNOSIS — Z1231 Encounter for screening mammogram for malignant neoplasm of breast: Secondary | ICD-10-CM

## 2012-03-23 ENCOUNTER — Other Ambulatory Visit: Payer: Self-pay | Admitting: Internal Medicine

## 2012-03-23 DIAGNOSIS — R928 Other abnormal and inconclusive findings on diagnostic imaging of breast: Secondary | ICD-10-CM

## 2012-03-26 ENCOUNTER — Ambulatory Visit
Admission: RE | Admit: 2012-03-26 | Discharge: 2012-03-26 | Disposition: A | Discharge status: Home / Self Care | Payer: Medicare HMO | Source: Ambulatory Visit | Attending: Internal Medicine | Admitting: Internal Medicine

## 2012-03-26 DIAGNOSIS — R928 Other abnormal and inconclusive findings on diagnostic imaging of breast: Secondary | ICD-10-CM

## 2012-04-21 ENCOUNTER — Ambulatory Visit: Payer: Medicare HMO | Admitting: Infectious Disease

## 2012-04-23 ENCOUNTER — Ambulatory Visit: Payer: Medicare HMO | Admitting: Infectious Disease

## 2012-04-27 ENCOUNTER — Inpatient Hospital Stay (HOSPITAL_COMMUNITY)
Admission: EM | Admit: 2012-04-27 | Discharge: 2012-05-06 | DRG: 606 | Disposition: A | Discharge status: Home / Self Care | Payer: Medicare HMO | Attending: Internal Medicine | Admitting: Internal Medicine

## 2012-04-27 ENCOUNTER — Telehealth: Payer: Self-pay | Admitting: *Deleted

## 2012-04-27 ENCOUNTER — Encounter (HOSPITAL_COMMUNITY): Payer: Self-pay | Admitting: Emergency Medicine

## 2012-04-27 DIAGNOSIS — L0390 Cellulitis, unspecified: Secondary | ICD-10-CM

## 2012-04-27 DIAGNOSIS — B964 Proteus (mirabilis) (morganii) as the cause of diseases classified elsewhere: Secondary | ICD-10-CM | POA: Diagnosis present

## 2012-04-27 DIAGNOSIS — I2510 Atherosclerotic heart disease of native coronary artery without angina pectoris: Secondary | ICD-10-CM | POA: Diagnosis present

## 2012-04-27 DIAGNOSIS — Z87891 Personal history of nicotine dependence: Secondary | ICD-10-CM

## 2012-04-27 DIAGNOSIS — R21 Rash and other nonspecific skin eruption: Secondary | ICD-10-CM | POA: Diagnosis present

## 2012-04-27 DIAGNOSIS — D649 Anemia, unspecified: Secondary | ICD-10-CM

## 2012-04-27 DIAGNOSIS — I509 Heart failure, unspecified: Secondary | ICD-10-CM | POA: Diagnosis present

## 2012-04-27 DIAGNOSIS — N184 Chronic kidney disease, stage 4 (severe): Secondary | ICD-10-CM | POA: Diagnosis present

## 2012-04-27 DIAGNOSIS — L03039 Cellulitis of unspecified toe: Secondary | ICD-10-CM

## 2012-04-27 DIAGNOSIS — G4733 Obstructive sleep apnea (adult) (pediatric): Secondary | ICD-10-CM

## 2012-04-27 DIAGNOSIS — E119 Type 2 diabetes mellitus without complications: Secondary | ICD-10-CM | POA: Diagnosis present

## 2012-04-27 DIAGNOSIS — I872 Venous insufficiency (chronic) (peripheral): Secondary | ICD-10-CM | POA: Diagnosis present

## 2012-04-27 DIAGNOSIS — L270 Generalized skin eruption due to drugs and medicaments taken internally: Principal | ICD-10-CM | POA: Diagnosis present

## 2012-04-27 DIAGNOSIS — K219 Gastro-esophageal reflux disease without esophagitis: Secondary | ICD-10-CM | POA: Diagnosis present

## 2012-04-27 DIAGNOSIS — Z8673 Personal history of transient ischemic attack (TIA), and cerebral infarction without residual deficits: Secondary | ICD-10-CM

## 2012-04-27 DIAGNOSIS — I4891 Unspecified atrial fibrillation: Secondary | ICD-10-CM | POA: Diagnosis present

## 2012-04-27 DIAGNOSIS — J45909 Unspecified asthma, uncomplicated: Secondary | ICD-10-CM | POA: Diagnosis present

## 2012-04-27 DIAGNOSIS — I428 Other cardiomyopathies: Secondary | ICD-10-CM | POA: Diagnosis present

## 2012-04-27 DIAGNOSIS — L02619 Cutaneous abscess of unspecified foot: Secondary | ICD-10-CM

## 2012-04-27 DIAGNOSIS — I10 Essential (primary) hypertension: Secondary | ICD-10-CM | POA: Diagnosis present

## 2012-04-27 DIAGNOSIS — E039 Hypothyroidism, unspecified: Secondary | ICD-10-CM | POA: Diagnosis present

## 2012-04-27 DIAGNOSIS — I5043 Acute on chronic combined systolic (congestive) and diastolic (congestive) heart failure: Secondary | ICD-10-CM | POA: Diagnosis not present

## 2012-04-27 DIAGNOSIS — M8610 Other acute osteomyelitis, unspecified site: Secondary | ICD-10-CM

## 2012-04-27 DIAGNOSIS — E662 Morbid (severe) obesity with alveolar hypoventilation: Secondary | ICD-10-CM | POA: Diagnosis present

## 2012-04-27 DIAGNOSIS — L408 Other psoriasis: Secondary | ICD-10-CM | POA: Diagnosis present

## 2012-04-27 DIAGNOSIS — I129 Hypertensive chronic kidney disease with stage 1 through stage 4 chronic kidney disease, or unspecified chronic kidney disease: Secondary | ICD-10-CM | POA: Diagnosis present

## 2012-04-27 LAB — BASIC METABOLIC PANEL
GFR calc non Af Amer: 21 mL/min — ABNORMAL LOW (ref >90)
Glucose, Bld: 124 mg/dL — ABNORMAL HIGH (ref 70–99)
Potassium: 4 mEq/L (ref 3.5–5.1)
Sodium: 141 mEq/L (ref 135–145)

## 2012-04-27 LAB — CBC
Hemoglobin: 9.8 g/dL — ABNORMAL LOW (ref 12.0–15.0)
MCHC: 31.9 g/dL (ref 30.0–36.0)
Platelets: 277 10*3/uL (ref 150–400)
RBC: 3.48 MIL/uL — ABNORMAL LOW (ref 3.87–5.11)

## 2012-04-27 MED ORDER — FENTANYL CITRATE 0.05 MG/ML IJ SOLN
50.0000 ug | Freq: Once | INTRAMUSCULAR | Status: AC
  Administered 2012-04-27: 50 ug via INTRAVENOUS
  Filled 2012-04-27: qty 2

## 2012-04-27 MED ORDER — VANCOMYCIN HCL IN DEXTROSE 1-5 GM/200ML-% IV SOLN
1000.0000 mg | Freq: Once | INTRAVENOUS | Status: AC
  Administered 2012-04-27: 1000 mg via INTRAVENOUS
  Filled 2012-04-27: qty 200

## 2012-04-27 NOTE — ED Notes (Signed)
MD at bedside. Dr. Knapp at bedside.  

## 2012-04-27 NOTE — ED Provider Notes (Signed)
History     CSN: [id number] Arrival date & time 04/27/12  1558 First MD Initiated Contact with Patient 04/27/12 2040    Chief Complaint  Patient presents with  . yeast infection in skin folds     HPI Patient presents to the emergency room with complaints of skin irritation and infection. The patient has been having trouble with this ongoing for at least the last week. She seen her primary Dr. and initially was started on oral antibiotics. She was referred to a dermatologist who evaluated the patient and recommended topical antifungal agents. Patient has history of diabetes and is morbidly obese. Skin folds of her pannus, pelvic region, and is have been irritated and erythematous.  The skin has now become erythematous and bleeding. She has also had a purulent drainage and discharge. The patient has not had fever but the pain is increasing in severity such that she can even walk or ambulate without having severe pain Past Medical History  Diagnosis Date  . Asthma   . Bronchitis   . Allergic rhinitis   . Diabetes mellitus   . Stroke   . HTN (hypertension)   . OSA (obstructive sleep apnea)   . DJD (degenerative joint disease)   . Kidney stones   . Depression   . Hyperlipidemia   . Complication of anesthesia     trouble waking up    Past Surgical History  Procedure Date  . Appendectomy   . Cholecystectomy   . Total knee arthroplasty   . Total abdominal hysterectomy   . Back surgery   . Cystectomy     left hand    Family History  Problem Relation Age of Onset  . Heart attack Father   . Asthma Father   . Heart disease Paternal Uncle   . Rectal cancer Paternal Aunt   . Other Mother     mva    History  Substance Use Topics  . Smoking status: Former Smoker    Types: Cigarettes  . Smokeless tobacco: Never Used   Comment: smoked for 1.5 yrs about 2 cigs a day ,only if stressed  . Alcohol Use: No    OB History    Grav Para Term Preterm Abortions TAB SAB Ect Mult Living                    Review of Systems  All other systems reviewed and are negative.    Allergies  Daptomycin; Morphine and related; Cephalexin; Celecoxib; Codeine; Fluoxetine hcl; Latex; Ofloxacin; Penicillins; Rofecoxib; and Sulfonamide derivatives  Home Medications   Current Outpatient Rx  Name Route Sig Dispense Refill  . IPRATROPIUM-ALBUTEROL 18-103 MCG/ACT IN AERO Inhalation Inhale 2 puffs into the lungs 3 (three) times daily. 1 Inhaler 4  . ALLOPURINOL 100 MG PO TABS Oral Take 1 tablet by mouth daily.    . AMIODARONE HCL 200 MG PO TABS Oral Take 200 mg by mouth daily.    . ASPIRIN 81 MG PO TABS Oral Take 81 mg by mouth daily.      . ATORVASTATIN CALCIUM 20 MG PO TABS Oral Take 20 mg by mouth at bedtime.    [name] CLOBETASOL PROPIONATE 0.05 % EX OINT Topical Apply 1 application topically daily. Use as directed on legs    . DIGOXIN 0.125 MG PO TABS Oral Take 1 tablet by mouth daily.    [name] FLUTICASONE PROPIONATE  HFA 220 MCG/ACT IN AERO Inhalation Inhale 2 puffs into the lungs 3 (three) times daily.      [name]  FUROSEMIDE 80 MG PO TABS Oral Take 2 tablets (160 mg total) by mouth 2 (two) times daily. 120 tablet 3  . INSULIN ASPART 100 UNIT/ML [place] SOLN Subcutaneous Inject 0-20 Units into the skin 3 (three) times daily before meals.    [name] LANTUS 100 UNIT/ML [place] SOLN Subcutaneous Inject 10 Units into the skin at bedtime. 10 units at bedtime    . LEVOFLOXACIN 500 MG PO TABS Oral Take 500 mg by mouth daily.    [name] METOPROLOL TARTRATE 25 MG PO TABS Oral Take 1 tablet (25 mg total) by mouth 2 (two) times daily. 60 tablet 3  . PANTOPRAZOLE SODIUM 40 MG PO TBEC Oral Take 40 mg by mouth daily.    [name] SPIRONOLACTONE 25 MG PO TABS Oral Take 25 mg by mouth daily.      . WARFARIN SODIUM 6 MG PO TABS Oral Take 6 mg by mouth daily.    [name] ZOLPIDEM TARTRATE 5 MG PO TABS Oral Take 5 mg by mouth at bedtime as needed. For sleep      BP 194/51  Pulse 59  Temp(Src) 97.7 F (36.5 C) (Oral)  Resp 22  Wt 262 lb (118.842  kg)  SpO2 100%  Physical Exam  Nursing note and vitals reviewed. Constitutional: No distress.       Morbidly obese  HENT:  Head: Normocephalic and atraumatic.  Right Ear: External ear normal.  Left Ear: External ear normal.  Eyes: Conjunctivae are normal. Right eye exhibits no discharge. Left eye exhibits no discharge. No scleral icterus.  Neck: Neck supple. No tracheal deviation present.  Cardiovascular: Normal rate, regular rhythm and intact distal pulses.   Pulmonary/Chest: Effort normal and breath sounds normal. No stridor. No respiratory distress. She has no wheezes. She has no rales.  Abdominal: Soft. Bowel sounds are normal. She exhibits no distension. There is no tenderness. There is no rebound and no guarding.  Musculoskeletal: She exhibits tenderness. She exhibits no edema.       CT scan exam  Neurological: She is alert. She has normal strength. No sensory deficit. Cranial nerve deficit:  no gross defecits noted. She exhibits normal muscle tone. She displays no seizure activity. Coordination normal.  Skin: Skin is warm and dry. Rash noted. There is erythema.       The patient has erythema and superficial ulcerations in the skin folds of her pannus, thighs, and perineum; no crepitus, no fluctuance, mild induration; mucopurulent drainage no discharge, malodorous  Psychiatric: She has a normal mood and affect.    ED Course  Procedures (including critical care time)  Medications  insulin aspart (NOVOLOG) 100 UNIT/ML injection (not administered)  warfarin (COUMADIN) 6 MG tablet (not administered)  vancomycin (VANCOCIN) IVPB 1000 mg/200 mL premix (1000 mg Intravenous Given 04/27/12 2214)  fentaNYL (SUBLIMAZE) injection 50 mcg (50 mcg Intravenous Given 04/27/12 2148)    Labs Reviewed  CBC - Abnormal; Notable for the following:    RBC 3.48 (*)    Hemoglobin 9.8 (*)    HCT 30.7 (*)    RDW 18.2 (*)    All other components within normal limits  BASIC METABOLIC PANEL - Abnormal;  Notable for the following:    Glucose, Bld 124 (*)    BUN 41 (*)    Creatinine, Ser 2.39 (*)    GFR calc non Af Amer 21 (*)    GFR calc Af Amer 24 (*)    All other components within normal limits  WOUND CULTURE   No results  found.   1. Cellulitis       MDM  Patient has extensive soft tissue inflammation and infection. Exam does not suggest a Fournier's gangrene however and concerned about her lack of response to topical medications and oral antibiotics.  With her comorbidities and the fact that the symptoms have been worsening I will admit the patient to the hospital for IV antibiotics.  She has been given a dose of vancomycin IV and medications for pain control.      Celene Kras, MD 04/27/12 [phone]

## 2012-04-27 NOTE — Telephone Encounter (Signed)
Pt is in the hospital & they will not be bringing her. They will call back to reschedule. Asked front office to cancel the appt

## 2012-04-27 NOTE — ED Notes (Signed)
Also has had diarrhea today--unable to make it to bathroom.

## 2012-04-27 NOTE — ED Notes (Signed)
Has red, raw area under skin folds in abd, and top of thighs. Has seen dr dean and dr drew Yetta Barre (derm) for same. No better, uncomforatable.

## 2012-04-27 NOTE — ED Notes (Signed)
I took the patient to the restroom outside in the lobby. The patient did urinate

## 2012-04-27 NOTE — H&P (Signed)
History and Physical  [name] ZOX:[id number] DOB: 1951-10-11 DOA: 04/27/2012  Referring physician: PCP: Willey Blade, MD, MD   Chief Complaint: Worsening Rash  HPI:  61 yr old AAF presented to Ed with a bad rash-rash started about 2 weeks ago and lastTuesday and went to se Dr. [name] last Tuesday and referred to Dr. Yetta Barre of dermatology ([facility]) and was told to place some desitin on this.  The desitin didn't help and nothing really seemed to help.   Lesion has a history of psoriasis and was placed on methotrexate which was subsequently discontinued by Dr. Yetta Barre is thought that this may be an interaction between the chronic Levaquin that she's on for bacteria suppression of osteomyelitis. The area became more raw and then started to ooze and has some sticking of the skin to her clothes and couldn't sleep at night  And was having severe pain as well.  Describes cold chills but no specifc fevers.  Rash seems to have spread.  Looks worse to her than it has and feels worse. 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 Eminence Callas  Atrial flutter 07/2010-rate controlled,cardioverted 05/2011 admit (Dr. 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 -2009  HLd  Intubated for Pulm, Edema causing ARF 01/21/12   Review of Systems: no SOb right now, no cp, no cough or cold, -some runny nose, no N/V, + SOB which is usual for her-usually wears  oxygen and is not able to tolerate this all the time, no headaches, no stroke symptoms, no seizures, no dyruia, no hematuria, dark stool, tarry stool   Past Medical History  Diagnosis Date  . Asthma   . Bronchitis   . Allergic rhinitis   . Diabetes mellitus   . Stroke   . HTN (hypertension)   . OSA (obstructive sleep apnea)   . DJD (degenerative joint disease)   . Kidney stones   . Depression   . Hyperlipidemia   . Complication of anesthesia     trouble waking up    Past Surgical History  Procedure Date  . Appendectomy   . Cholecystectomy   . Total knee arthroplasty   . Total abdominal hysterectomy   . Back surgery   . Cystectomy     left hand    Social History:  reports that she has quit smoking. Her smoking use included Cigarettes. She has never used smokeless tobacco. She reports that she does not drink alcohol or use illicit drugs.  Allergies  Allergen Reactions  . Daptomycin Other (See Comments)    Elevated CPK  . Morphine And Related Nausea And Vomiting  . Cephalexin Rash    unknown  . Celecoxib     unknown  . Codeine     unknown  . Fluoxetine Hcl     unknown  . Latex     REACTION: undefined  . Ofloxacin     unknown  . Penicillins     unknown  . Rofecoxib  unknown  . Sulfonamide Derivatives     unknown    Family History  Problem Relation Age of Onset  . Heart attack Father   . Asthma Father   . Heart disease Paternal Uncle   . Rectal cancer Paternal Aunt   . Other Mother     mva     Prior to Admission medications   Medication Sig Start Date End Date Taking? Authorizing Provider  albuterol-ipratropium (COMBIVENT) 18-103 MCG/ACT inhaler Inhale 2 puffs into the lungs 3 (three) times daily. 02/05/12 02/04/13 Yes Gwenyth Bender, MD  allopurinol (ZYLOPRIM) 100 MG tablet Take 1 tablet by mouth daily. 09/16/11  Yes Historical Provider, MD  amiodarone (PACERONE) 200 MG tablet Take 200 mg by mouth daily.   Yes Historical Provider, MD  aspirin 81 MG  tablet Take 81 mg by mouth daily.     Yes Historical Provider, MD  atorvastatin (LIPITOR) 20 MG tablet Take 20 mg by mouth at bedtime.   Yes Historical Provider, MD  clobetasol (TEMOVATE) 0.05 % ointment Apply 1 application topically daily. Use as directed on legs 04/29/11  Yes Historical Provider, MD  digoxin (LANOXIN) 0.125 MG tablet Take 1 tablet by mouth daily. 09/14/11  Yes Historical Provider, MD  fluticasone (FLOVENT HFA) 220 MCG/ACT inhaler Inhale 2 puffs into the lungs 3 (three) times daily.     Yes Historical Provider, MD  furosemide (LASIX) 80 MG tablet Take 2 tablets (160 mg total) by mouth 2 (two) times daily. 02/05/12 02/04/13 Yes Gwenyth Bender, MD  insulin aspart (NOVOLOG) 100 UNIT/ML injection Inject 0-20 Units into the skin 3 (three) times daily before meals.   Yes Historical Provider, MD  LANTUS 100 UNIT/ML injection Inject 10 Units into the skin at bedtime. 10 units at bedtime 08/10/11  Yes Historical Provider, MD  levofloxacin (LEVAQUIN) 500 MG tablet Take 500 mg by mouth daily.   Yes Historical Provider, MD  metoprolol tartrate (LOPRESSOR) 25 MG tablet Take 1 tablet (25 mg total) by mouth 2 (two) times daily. 02/05/12 02/04/13 Yes Gwenyth Bender, MD  pantoprazole (PROTONIX) 40 MG tablet Take 40 mg by mouth daily.   Yes Historical Provider, MD  spironolactone (ALDACTONE) 25 MG tablet Take 25 mg by mouth daily.     Yes Historical Provider, MD  warfarin (COUMADIN) 6 MG tablet Take 6 mg by mouth daily.   Yes Historical Provider, MD  zolpidem (AMBIEN) 5 MG tablet Take 5 mg by mouth at bedtime as needed. For sleep   Yes Historical Provider, MD   Physical Exam: Filed Vitals:   04/27/12 1656 04/27/12 2044  BP: 172/60 194/51  Pulse: 87 59  Temp: 97.9 F (36.6 C) 97.7 F (36.5 C)  TempSrc: Oral Oral  Resp: 23 22  Weight: 118.842 kg (262 lb)   SpO2: 98% 100%     General:  Morbidly obese African American female in no apparent distress  Eyes: No pallor no icterus  ENT: Neck soft supple,  no thyromegaly. Poor dentition  Neck: See above  Cardiovascular: S1-S2 no murmur rub or gallop. Slightly tachycardic  Respiratory: Clinically clear no added sound  Abdomen: Patient has a rash as per below  Skin: Patient has denuded rash with the assistance of skin over her lower abdomen as well as rash in intertriginous areas of the legs and around the skin that might touch when patient ambulates. It is red and oozing with no foul odor and has been cleansed by emergency room  Musculoskeletal: Moving all 4 limbs  equally  Psychiatric: Pleasant and euthymic  Neurologic: No focal deficit noted  Labs on Admission:  Basic Metabolic Panel:  Lab 04/27/12 0454  NA 141  K 4.0  CL 106  CO2 23  GLUCOSE 124*  BUN 41*  CREATININE 2.39*  CALCIUM 8.7  MG --  PHOS --    Liver Function Tests: No results found for this basename: AST:5,ALT:5,ALKPHOS:5,BILITOT:5,PROT:5,ALBUMIN:5 in the last 168 hours No results found for this basename: LIPASE:5,AMYLASE:5 in the last 168 hours No results found for this basename: AMMONIA:5 in the last 168 hours  CBC:  Lab 04/27/12 2131  WBC 7.1  NEUTROABS --  HGB 9.8*  HCT 30.7*  MCV 88.2  PLT 277    Cardiac Enzymes: No results found for this basename: CKTOTAL:5,CKMB:5,CKMBINDEX:5,TROPONINI:5 in the last 168 hours  Troponin (Point of Care Test) No results found for this basename: TROPIPOC in the last 72 hours  BNP (last 3 results)  Basename 01/21/12 2330 01/21/12 1544 11/27/11 0355  PROBNP 1741.0* 824.9* 561.8*    CBG: No results found for this basename: GLUCAP:5 in the last 168 hours   Radiological Exams on Admission: No results found.  EKG: Independently reviewed. Not done   Active Problems:  * No active hospital problems. *     Assessment/Plan 1. Rash-patient has significant allergies to multiple medications and has been placed on vancomycin empirically for MRSA coverage. Patient is on chronic Bactrim suppression for  osteomyelitis of the left knee having last been seen by Dr. Cliffton Asters on 03/09/12. Her rash is auscultated by the fact that she has some venous insufficiency which might be complicating the issue.  Finally she has psoriasis which could become superinfected by her scratching the area-she has diabetes mellitus she probably has a polymicrobial infection-I have called Dr. Algis Liming for further recommendation management given she is on chronic suppressive levofloxacin and then still develop this-he recommends continue vancomycin for now and continue oral Levaquin and patient will be seen tomorrow from their standpoint. Wound cultures have been done by emergency room physician which should be followed 2. Itchy fibrillation continue metoprolol 25 mg twice a day, amiodarone 200 mg daily, Coumadin, digoxin 125 mg daily 3. CHF-euvolemic continue Aldactone 25 mg daily, digoxin and metoprolol as per above-continue Lasix 160 mg twice a day 4. Diabetes mellitus place on sliding scale insulin, continue Lantus 10 units at bedtime 5. Psoriasis-would recommend consult of Dr. Yetta Barre who is seen her in the past for her skin issues-I am unsure whether this is an actual infectious rash and this maybe secondary to toxicity from multiple medications that she is on. She may benefit from a skin biopsy at some point 6. Hyperlipidemia continue Zocor 40 mg daily 7. GERD continue pantoprazole 40 mg daily. 8. Gout continue allopurinol 100 mg daily 9. Nonobstructive CAD-continue aspirin 81 mg daily and other medications  Code Status: Full Family Communication: Discussed with son at Bedside Disposition Plan: Inpatient telemetry admitted to Dr. Diamantina Providence service  Pleas Koch, MD [facility] (P) [phone]   If 8PM-8AM, please contact floor/night-coverage at [email or web address], password [facility] 04/27/2012, 11:15 PM

## 2012-04-28 ENCOUNTER — Ambulatory Visit: Payer: Medicare HMO | Admitting: Infectious Disease

## 2012-04-28 DIAGNOSIS — L0390 Cellulitis, unspecified: Secondary | ICD-10-CM

## 2012-04-28 DIAGNOSIS — L0291 Cutaneous abscess, unspecified: Secondary | ICD-10-CM

## 2012-04-28 LAB — CBC
MCH: 27.4 pg (ref 26.0–34.0)
MCHC: 31.3 g/dL (ref 30.0–36.0)
MCV: 87.5 fL (ref 78.0–100.0)
Platelets: 230 10*3/uL (ref 150–400)
RDW: 18.1 % — ABNORMAL HIGH (ref 11.5–15.5)
WBC: 6.2 10*3/uL (ref 4.0–10.5)

## 2012-04-28 LAB — COMPREHENSIVE METABOLIC PANEL
Albumin: 2.4 g/dL — ABNORMAL LOW (ref 3.5–5.2)
Alkaline Phosphatase: 126 U/L — ABNORMAL HIGH (ref 39–117)
BUN: 37 mg/dL — ABNORMAL HIGH (ref 6–23)
Chloride: 107 mEq/L (ref 96–112)
Potassium: 3.9 mEq/L (ref 3.5–5.1)
Total Bilirubin: 0.2 mg/dL — ABNORMAL LOW (ref 0.3–1.2)

## 2012-04-28 LAB — GLUCOSE, CAPILLARY
Glucose-Capillary: 131 mg/dL — ABNORMAL HIGH (ref 70–99)
Glucose-Capillary: 127 mg/dL — ABNORMAL HIGH (ref 70–99)
Glucose-Capillary: 170 mg/dL — ABNORMAL HIGH (ref 70–99)

## 2012-04-28 MED ORDER — ASPIRIN EC 81 MG PO TBEC
81.0000 mg | DELAYED_RELEASE_TABLET | Freq: Every day | ORAL | Status: DC
  Administered 2012-04-28 – 2012-05-06 (×9): 81 mg via ORAL
  Filled 2012-04-28 (×9): qty 1

## 2012-04-28 MED ORDER — AMIODARONE HCL 200 MG PO TABS
200.0000 mg | ORAL_TABLET | Freq: Every day | ORAL | Status: DC
  Administered 2012-04-28 – 2012-05-06 (×4): 200 mg via ORAL
  Filled 2012-04-28 (×9): qty 1

## 2012-04-28 MED ORDER — OXYCODONE HCL 5 MG PO TABS
5.0000 mg | ORAL_TABLET | ORAL | Status: DC | PRN
  Administered 2012-04-28 – 2012-04-29 (×2): 5 mg via ORAL
  Filled 2012-04-28 (×2): qty 1

## 2012-04-28 MED ORDER — SENNA 8.6 MG PO TABS
1.0000 | ORAL_TABLET | Freq: Two times a day (BID) | ORAL | Status: DC
  Administered 2012-04-28 – 2012-05-04 (×11): 8.6 mg via ORAL
  Filled 2012-04-28 (×11): qty 1

## 2012-04-28 MED ORDER — IPRATROPIUM-ALBUTEROL 20-100 MCG/ACT IN AERS
1.0000 | INHALATION_SPRAY | Freq: Three times a day (TID) | RESPIRATORY_TRACT | Status: DC
  Administered 2012-04-28 – 2012-05-06 (×23): 1 via RESPIRATORY_TRACT

## 2012-04-28 MED ORDER — HEPARIN SODIUM (PORCINE) 5000 UNIT/ML IJ SOLN: 5000.0000 [IU] | Freq: Three times a day (TID) | INTRAMUSCULAR | Status: DC

## 2012-04-28 MED ORDER — INSULIN GLARGINE 100 UNIT/ML ~~LOC~~ SOLN
10.0000 [IU] | Freq: Every day | SUBCUTANEOUS | Status: DC
  Administered 2012-04-28 – 2012-05-03 (×6): 10 [IU] via SUBCUTANEOUS

## 2012-04-28 MED ORDER — METOPROLOL TARTRATE 25 MG PO TABS
25.0000 mg | ORAL_TABLET | Freq: Two times a day (BID) | ORAL | Status: DC
  Administered 2012-04-28 – 2012-04-29 (×4): 25 mg via ORAL
  Filled 2012-04-28 (×7): qty 1

## 2012-04-28 MED ORDER — WARFARIN SODIUM 6 MG PO TABS
6.0000 mg | ORAL_TABLET | Freq: Once | ORAL | Status: AC
  Administered 2012-04-28: 6 mg via ORAL
  Filled 2012-04-28: qty 1

## 2012-04-28 MED ORDER — SIMVASTATIN 40 MG PO TABS
40.0000 mg | ORAL_TABLET | Freq: Every day | ORAL | Status: DC
  Filled 2012-04-28: qty 1

## 2012-04-28 MED ORDER — FLUTICASONE PROPIONATE HFA 220 MCG/ACT IN AERO
2.0000 | INHALATION_SPRAY | Freq: Three times a day (TID) | RESPIRATORY_TRACT | Status: DC
  Administered 2012-04-28: 2 via RESPIRATORY_TRACT
  Filled 2012-04-28: qty 12

## 2012-04-28 MED ORDER — WARFARIN SODIUM 6 MG PO TABS
6.0000 mg | ORAL_TABLET | Freq: Every day | ORAL | Status: DC
  Filled 2012-04-28: qty 1

## 2012-04-28 MED ORDER — FLUTICASONE PROPIONATE HFA 220 MCG/ACT IN AERO
2.0000 | INHALATION_SPRAY | Freq: Two times a day (BID) | RESPIRATORY_TRACT | Status: DC
  Administered 2012-04-28 – 2012-05-06 (×16): 2 via RESPIRATORY_TRACT

## 2012-04-28 MED ORDER — PANTOPRAZOLE SODIUM 40 MG PO TBEC
40.0000 mg | DELAYED_RELEASE_TABLET | Freq: Every day | ORAL | Status: DC
  Administered 2012-04-28 – 2012-05-06 (×9): 40 mg via ORAL
  Filled 2012-04-28 (×9): qty 1

## 2012-04-28 MED ORDER — WARFARIN - PHARMACIST DOSING INPATIENT: Freq: Every day | Status: DC

## 2012-04-28 MED ORDER — WARFARIN SODIUM 6 MG PO TABS: 6.0000 mg | ORAL_TABLET | Freq: Every day | ORAL | Status: DC

## 2012-04-28 MED ORDER — SPIRONOLACTONE 25 MG PO TABS
25.0000 mg | ORAL_TABLET | Freq: Every day | ORAL | Status: DC
  Administered 2012-04-28 – 2012-05-06 (×9): 25 mg via ORAL
  Filled 2012-04-28 (×9): qty 1

## 2012-04-28 MED ORDER — ATORVASTATIN CALCIUM 20 MG PO TABS
20.0000 mg | ORAL_TABLET | Freq: Every day | ORAL | Status: DC
  Administered 2012-04-28 – 2012-05-05 (×8): 20 mg via ORAL
  Filled 2012-04-28 (×9): qty 1

## 2012-04-28 MED ORDER — IPRATROPIUM-ALBUTEROL 18-103 MCG/ACT IN AERO
2.0000 | INHALATION_SPRAY | Freq: Three times a day (TID) | RESPIRATORY_TRACT | Status: DC
  Filled 2012-04-28: qty 14.7

## 2012-04-28 MED ORDER — ALLOPURINOL 100 MG PO TABS
100.0000 mg | ORAL_TABLET | Freq: Every day | ORAL | Status: DC
  Administered 2012-04-28 – 2012-05-06 (×10): 100 mg via ORAL
  Filled 2012-04-28 (×9): qty 1

## 2012-04-28 MED ORDER — ZOLPIDEM TARTRATE 5 MG PO TABS
5.0000 mg | ORAL_TABLET | Freq: Every evening | ORAL | Status: DC | PRN
  Administered 2012-04-28 – 2012-05-05 (×9): 5 mg via ORAL
  Filled 2012-04-28 (×9): qty 1

## 2012-04-28 MED ORDER — DIGOXIN 125 MCG PO TABS
125.0000 ug | ORAL_TABLET | Freq: Every day | ORAL | Status: DC
  Administered 2012-04-28 – 2012-05-02 (×3): 125 ug via ORAL
  Filled 2012-04-28 (×9): qty 1

## 2012-04-28 MED ORDER — FUROSEMIDE 80 MG PO TABS
160.0000 mg | ORAL_TABLET | Freq: Two times a day (BID) | ORAL | Status: DC
  Administered 2012-04-28 – 2012-05-06 (×17): 160 mg via ORAL
  Filled 2012-04-28 (×20): qty 2

## 2012-04-28 MED ORDER — LEVOFLOXACIN 500 MG PO TABS
500.0000 mg | ORAL_TABLET | Freq: Every day | ORAL | Status: DC
  Administered 2012-04-28 – 2012-04-29 (×3): 500 mg via ORAL
  Filled 2012-04-28 (×4): qty 1

## 2012-04-28 MED ORDER — CLOBETASOL PROPIONATE 0.05 % EX OINT
1.0000 "application " | TOPICAL_OINTMENT | Freq: Every day | CUTANEOUS | Status: DC
  Administered 2012-04-29 – 2012-05-04 (×6): 1 via TOPICAL
  Filled 2012-04-28 (×2): qty 15

## 2012-04-28 MED ORDER — IPRATROPIUM-ALBUTEROL 20-100 MCG/ACT IN AERS
1.0000 | INHALATION_SPRAY | Freq: Four times a day (QID) | RESPIRATORY_TRACT | Status: DC
  Administered 2012-04-28 (×2): 1 via RESPIRATORY_TRACT
  Filled 2012-04-28: qty 4

## 2012-04-28 NOTE — Progress Notes (Signed)
Subjective:  Appreciate infectious disease evaluation. Patient reports she's feeling better this evening after her abdominal wounds were dressed. She complains of some itching and discomfort in her medial thighs otherwise. She denies chest pains, palpitations or shortness of breath. She's had occasional night sweats without documented fever. Appetite otherwise intact.   Allergies  Allergen Reactions  . Daptomycin Other (See Comments)    Elevated CPK  . Morphine And Related Nausea And Vomiting  . Cephalexin Rash    unknown  . Celecoxib     unknown  . Codeine     unknown  . Fluoxetine Hcl     unknown  . Latex     REACTION: undefined  . Ofloxacin     unknown  . Penicillins     unknown  . Rofecoxib     unknown  . Sulfonamide Derivatives     unknown   Current Facility-Administered Medications  Medication Dose Route Frequency Provider Last Rate Last Dose  . allopurinol (ZYLOPRIM) tablet 100 mg  100 mg Oral Daily Rhetta Mura, MD   100 mg at 04/28/12 0909  . amiodarone (PACERONE) tablet 200 mg  200 mg Oral Daily Rhetta Mura, MD   200 mg at 04/28/12 0909  . aspirin EC tablet 81 mg  81 mg Oral Daily Rhetta Mura, MD   81 mg at 04/28/12 0906  . atorvastatin (LIPITOR) tablet 20 mg  20 mg Oral q1800 Gwenyth Bender, MD   20 mg at 04/28/12 1904  . clobetasol ointment (TEMOVATE) 0.05 % 1 application  1 application Topical Daily Rhetta Mura, MD      . digoxin (LANOXIN) tablet 125 mcg  125 mcg Oral Daily Rhetta Mura, MD   125 mcg at 04/28/12 0906  . fentaNYL (SUBLIMAZE) injection 50 mcg  50 mcg Intravenous Once Celene Kras, MD   50 mcg at 04/27/12 2148  . fluticasone (FLOVENT HFA) 220 MCG/ACT inhaler 2 puff  2 puff Inhalation BID Gwenyth Bender, MD   2 puff at 04/28/12 2045  . furosemide (LASIX) tablet 160 mg  160 mg Oral BID Rhetta Mura, MD   160 mg at 04/28/12 1904  . insulin glargine (LANTUS) injection 10 Units  10 Units Subcutaneous QHS Rhetta Mura, MD      . Ipratropium-Albuterol (COMBIVENT) respimat 1 puff  1 puff Inhalation TID Gwenyth Bender, MD   1 puff at 04/28/12 2047  . levofloxacin (LEVAQUIN) tablet 500 mg  500 mg Oral QHS Rhetta Mura, MD   500 mg at 04/28/12 0219  . metoprolol tartrate (LOPRESSOR) tablet 25 mg  25 mg Oral BID Rhetta Mura, MD   25 mg at 04/28/12 0906  . oxyCODONE (Oxy IR/ROXICODONE) immediate release tablet 5 mg  5 mg Oral Q4H PRN Rhetta Mura, MD      . pantoprazole (PROTONIX) EC tablet 40 mg  40 mg Oral Daily Rhetta Mura, MD   40 mg at 04/28/12 0906  . senna (SENOKOT) tablet 8.6 mg  1 tablet Oral BID Rhetta Mura, MD   8.6 mg at 04/28/12 1019  . spironolactone (ALDACTONE) tablet 25 mg  25 mg Oral Daily Rhetta Mura, MD   25 mg at 04/28/12 0906  . vancomycin (VANCOCIN) IVPB 1000 mg/200 mL premix  1,000 mg Intravenous Once Celene Kras, MD   1,000 mg at 04/27/12 2214  . warfarin (COUMADIN) tablet 6 mg  6 mg Oral Once Berkley Harvey, PHARMD   6 mg at 04/28/12 1903  . Warfarin -  Pharmacist Dosing Inpatient   Does not apply q1800 Rhetta Mura, MD      . zolpidem (AMBIEN) tablet 5 mg  5 mg Oral QHS PRN Rhetta Mura, MD   5 mg at 04/28/12 0219  . DISCONTD: albuterol-ipratropium (COMBIVENT) inhaler 2 puff  2 puff Inhalation TID Rhetta Mura, MD      . DISCONTD: fluticasone (FLOVENT HFA) 220 MCG/ACT inhaler 2 puff  2 puff Inhalation TID Rhetta Mura, MD   2 puff at 04/28/12 0942  . DISCONTD: heparin injection 5,000 Units  5,000 Units Subcutaneous Q8H Rhetta Mura, MD      . DISCONTD: Ipratropium-Albuterol (COMBIVENT) respimat 1 puff  1 puff Inhalation QID Gwenyth Bender, MD   1 puff at 04/28/12 1201  . DISCONTD: simvastatin (ZOCOR) tablet 40 mg  40 mg Oral Daily Rhetta Mura, MD      . DISCONTD: warfarin (COUMADIN) tablet 6 mg  6 mg Oral Daily Rhetta Mura, MD      . DISCONTD: warfarin (COUMADIN) tablet 6 mg  6 mg Oral  q1800 Gwenyth Bender, MD      . DISCONTD: Warfarin - Pharmacist Dosing Inpatient   Does not apply [id number] Gwenyth Bender, MD        Objective: Blood pressure 184/69, pulse 60, temperature 97.6 F (36.4 C), temperature source Oral, resp. rate 18, height 5\' 3"  (1.6 m), weight 259 lb 11.2 oz (117.8 kg), SpO2 94.00%.  Obese black female presently in no acute distress. HEENT: No sinus tenderness. No sclera icterus. NECK: No posterior cervical nodes. LUNGS: Decreased breath sounds at bases. Presently without wheezes. No vocal fremitus. CV: Normal S1, S2 without S3. ABD: Mild midepigastric tenderness. No rebound tenderness. Patient noted wounds are presently dressed. No excessive drainage noted. MSK: Chronic trace pitting edema. Negative Homans. NEURO: Nonfocal. SKIN: Skin fold areas as previously noted. Chronic thickening and lichenification of the lower extremities.  Lab results: Results for orders placed during the hospital encounter of 04/27/12 (from the past 48 hour(s))  CBC     Status: Abnormal   Collection Time   04/27/12  9:31 PM      Component Value Range Comment   WBC 7.1  4.0 - 10.5 (K/uL)    RBC 3.48 (*) 3.87 - 5.11 (MIL/uL)    Hemoglobin 9.8 (*) 12.0 - 15.0 (g/dL)    HCT 19.1 (*) 47.8 - 46.0 (%)    MCV 88.2  78.0 - 100.0 (fL)    MCH 28.2  26.0 - 34.0 (pg)    MCHC 31.9  30.0 - 36.0 (g/dL)    RDW 29.5 (*) 62.1 - 15.5 (%)    Platelets 277  150 - 400 (K/uL)   BASIC METABOLIC PANEL     Status: Abnormal   Collection Time   04/27/12  9:31 PM      Component Value Range Comment   Sodium 141  135 - 145 (mEq/L)    Potassium 4.0  3.5 - 5.1 (mEq/L)    Chloride 106  96 - 112 (mEq/L)    CO2 23  19 - 32 (mEq/L)    Glucose, Bld 124 (*) 70 - 99 (mg/dL)    BUN 41 (*) 6 - 23 (mg/dL)    Creatinine, Ser 3.08 (*) 0.50 - 1.10 (mg/dL)    Calcium 8.7  8.4 - 10.5 (mg/dL)    GFR calc non Af Amer 21 (*) >90 (mL/min)    GFR calc Af Amer 24 (*) >90 (mL/min)   WOUND CULTURE  Status: Normal (Preliminary  result)   Collection Time   04/27/12  9:54 PM      Component Value Range Comment   Specimen Description LEG      Special Requests Normal      Gram Stain        Value: RARE WBC PRESENT,BOTH PMN AND MONONUCLEAR     FEW SQUAMOUS EPITHELIAL CELLS PRESENT     NO ORGANISMS SEEN   Culture NO GROWTH      Report Status PENDING     GLUCOSE, CAPILLARY     Status: Abnormal   Collection Time   04/28/12  1:05 AM      Component Value Range Comment   Glucose-Capillary 131 (*) 70 - 99 (mg/dL)   MRSA PCR SCREENING     Status: Normal   Collection Time   04/28/12  4:48 AM      Component Value Range Comment   MRSA by PCR NEGATIVE  NEGATIVE    COMPREHENSIVE METABOLIC PANEL     Status: Abnormal   Collection Time   04/28/12  5:28 AM      Component Value Range Comment   Sodium 140  135 - 145 (mEq/L)    Potassium 3.9  3.5 - 5.1 (mEq/L)    Chloride 107  96 - 112 (mEq/L)    CO2 23  19 - 32 (mEq/L)    Glucose, Bld 171 (*) 70 - 99 (mg/dL)    BUN 37 (*) 6 - 23 (mg/dL)    Creatinine, Ser 1.61 (*) 0.50 - 1.10 (mg/dL)    Calcium 8.7  8.4 - 10.5 (mg/dL)    Total Protein 6.8  6.0 - 8.3 (g/dL)    Albumin 2.4 (*) 3.5 - 5.2 (g/dL)    AST 19  0 - 37 (U/L)    ALT 16  0 - 35 (U/L)    Alkaline Phosphatase 126 (*) 39 - 117 (U/L)    Total Bilirubin 0.2 (*) 0.3 - 1.2 (mg/dL)    GFR calc non Af Amer 22 (*) >90 (mL/min)    GFR calc Af Amer 25 (*) >90 (mL/min)   CBC     Status: Abnormal   Collection Time   04/28/12  5:28 AM      Component Value Range Comment   WBC 6.2  4.0 - 10.5 (K/uL)    RBC 3.36 (*) 3.87 - 5.11 (MIL/uL)    Hemoglobin 9.2 (*) 12.0 - 15.0 (g/dL)    HCT 09.6 (*) 04.5 - 46.0 (%)    MCV 87.5  78.0 - 100.0 (fL)    MCH 27.4  26.0 - 34.0 (pg)    MCHC 31.3  30.0 - 36.0 (g/dL)    RDW 40.9 (*) 81.1 - 15.5 (%)    Platelets 230  150 - 400 (K/uL)   PROTIME-INR     Status: Abnormal   Collection Time   04/28/12  5:28 AM      Component Value Range Comment   Prothrombin Time 19.8 (*) 11.6 - 15.2 (seconds)     INR 1.65 (*) 0.00 - 1.49    GLUCOSE, CAPILLARY     Status: Abnormal   Collection Time   04/28/12  7:34 AM      Component Value Range Comment   Glucose-Capillary 139 (*) 70 - 99 (mg/dL)   GLUCOSE, CAPILLARY     Status: Abnormal   Collection Time   04/28/12 11:22 AM      Component Value Range Comment   Glucose-Capillary 127 (*)  70 - 99 (mg/dL)   GLUCOSE, CAPILLARY     Status: Abnormal   Collection Time   04/28/12  4:20 PM      Component Value Range Comment   Glucose-Capillary 170 (*) 70 - 99 (mg/dL)    Comment 1 Notify RN       Studies/Results: No results found.  Patient Active Problem List  Diagnoses  . HYPERLIPIDEMIA  . OBSTRUCTIVE SLEEP APNEA  . HYPERTENSION  . ATRIAL FIBRILLATION WITH RAPID VENTRICULAR RESPONSE  . Acute combined systolic and diastolic heart failure  . 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 BRONCHITIS, RECURRENT  . ASTHMA  . Obesity hypoventilation syndrome  . Prosthetic joint infection  . Group B streptococcal infection  . Gout  . Yeast infection involving the vagina and surrounding area  . SBO (small bowel obstruction)  . DM (diabetes mellitus)  . Chronic anticoagulation  . Oral candidiasis  . Laceration of leg  . Chronic venous insufficiency    Impression: Atypical skin rash with exfoliation. These areas are notably in the skin fold regions and areas of pressure. Rule out atypical drug reaction. History of severe psoriasis. Doubt cellulitis clinically. History of systolic and diastolic heart failure. Sleep apnea. Diabetes mellitus. Morbid obesity. History of chronic noncompliance. 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 History of candidiasis in vaginal region. Unable to use diflucan secondary to Amiodarone interaction.    Plan: Continue recommendations for wound care per infectious disease. Patient need to minimize  direct pressure in the skin folds. Continue home medications as before. Resume CPAP for sleep apnea. She has not used this  consistently at home. Further therapy pending response of the above.    [name], Namon Villarin 04/28/2012 9:17 PM

## 2012-04-28 NOTE — Progress Notes (Signed)
UR completed 

## 2012-04-28 NOTE — Consult Note (Signed)
Infectious Diseases Initial Consultation  Reason for Consultation:  rash   HPI: [name] is a 61 y.o. 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 She takes methotrexate for psoriasis. She states that roughly 10 days ago she noticed having increasing pain and exfoliating rash at her interdiginous area of groin/panus starting to errupt. It was across her stomach folds as well as thighs. She saw her primary care doctor 1 week ago who then referred her to a dermatologist on 5/17 who thought it was due to a drug reaction between levofloxacin and methotrexate. She was also instructed to use desitin to affected areas. Her wounds did not improve and thus was admitted for further evaluation. The areas that are affected are the folds of panus/ groin/ mons pubis/ and upper thighs. No other areas such as mouth, eyes, buttock are involved. She states that she is having chills but no fevers or nightsweats. She reports having excessive pain from this area. She initially used gauze between her folds of pannus, but dermatology asked her to stop since it was also tearing away her skin and causing bleeding.  Past Medical History  Diagnosis Date  . Asthma   . Bronchitis   . Allergic rhinitis   . Diabetes mellitus   . Stroke   . HTN (hypertension)   . OSA (obstructive sleep apnea)   . DJD (degenerative joint disease)   . Kidney stones   . Depression   . Hyperlipidemia   . Complication of anesthesia     trouble waking up    Allergies:  Allergies  Allergen Reactions  . Daptomycin Other (See Comments)    Elevated CPK  . Morphine And Related Nausea And Vomiting  . Cephalexin Rash    unknown  . Celecoxib     unknown  . Codeine     unknown  . Fluoxetine Hcl     unknown  . Latex     REACTION: undefined  .  Ofloxacin     unknown  . Penicillins     unknown  . Rofecoxib     unknown  . Sulfonamide Derivatives     unknown    MEDICATIONS:    . allopurinol  100 mg Oral Daily  . amiodarone  200 mg Oral Daily  . aspirin EC  81 mg Oral Daily  . atorvastatin  20 mg Oral q1800  . clobetasol ointment  1 application Topical Daily  . digoxin  125 mcg Oral Daily  . fentaNYL  50 mcg Intravenous Once  . fluticasone  2 puff Inhalation BID  . furosemide  160 mg Oral BID  . insulin glargine  10 Units Subcutaneous QHS  . Ipratropium-Albuterol  1 puff Inhalation TID  . levofloxacin  500 mg Oral QHS  . metoprolol tartrate  25 mg Oral BID  . pantoprazole  40 mg Oral Daily  . senna  1 tablet Oral BID  . spironolactone  25 mg Oral Daily  . vancomycin  1,000 mg Intravenous Once  . warfarin  6 mg Oral Once  . Warfarin - Pharmacist Dosing Inpatient   Does not apply q1800  . DISCONTD: albuterol-ipratropium  2 puff Inhalation TID  . DISCONTD: fluticasone  2 puff Inhalation TID  . DISCONTD: heparin  5,000 Units Subcutaneous Q8H  . DISCONTD: Ipratropium-Albuterol  1  puff Inhalation QID  . DISCONTD: simvastatin  40 mg Oral Daily  . DISCONTD: warfarin  6 mg Oral Daily  . DISCONTD: warfarin  6 mg Oral q1800  . DISCONTD: Warfarin - Pharmacist Dosing Inpatient   Does not apply q1800    History  Substance Use Topics  . Smoking status: Former Smoker    Types: Cigarettes  . Smokeless tobacco: Never Used   Comment: smoked for 1.5 yrs about 2 cigs a day ,only if stressed  . Alcohol Use: No    Family History  Problem Relation Age of Onset  . Heart attack Father   . Asthma Father   . Heart disease Paternal Uncle   . Rectal cancer Paternal Aunt   . Other Mother     mva    Review of Systems  Constitutional: positive for chills. Negative for chills, diaphoresis, activity change, appetite change, fatigue and unexpected weight change.  HENT: Negative for congestion, sore throat, rhinorrhea, sneezing,  trouble swallowing and sinus pressure.  Eyes: Negative for photophobia and visual disturbance.  Respiratory: Negative for cough, chest tightness, shortness of breath, wheezing and stridor.  Cardiovascular: Negative for chest pain, palpitations and leg swelling.  Gastrointestinal: Negative for nausea, vomiting, abdominal pain, diarrhea, constipation, blood in stool, abdominal distention and anal bleeding.  Genitourinary: Negative for dysuria, hematuria, flank pain and difficulty urinating.  Musculoskeletal: Negative for myalgias, back pain, joint swelling, arthralgias and gait problem.  Skin: New rash per HPI.   Neurological: Negative for dizziness, tremors, weakness and light-headedness.  Hematological: Negative for adenopathy. Does not bruise/bleed easily.  Psychiatric/Behavioral: Negative for behavioral problems, confusion, sleep disturbance, dysphoric mood, decreased concentration and agitation.    OBJECTIVE: Temp:  [97.6 F (36.4 C)-98.1 F (36.7 C)] 97.6 F (36.4 C) (05/21 1405) Pulse Rate:  [52-87] 60  (05/21 1405) Resp:  [15-23] 18  (05/21 1405) BP: (172-194)/(51-69) 184/69 mmHg (05/21 1405) SpO2:  [90 %-100 %] 90 % (05/21 1405) Weight:  [259 lb 11.2 oz (117.8 kg)-262 lb (118.842 kg)] 259 lb 11.2 oz (117.8 kg) (05/21 0055)   General Appearance:    Alert, cooperative, no distress, appears stated age  Head:    Normocephalic, without obvious abnormality, atraumatic  Eyes:    PERRL, conjunctiva/corneas clear, EOM's intact,  Ears:    Normal TM's and external ear canals, both ears  Nose:   Nares normal, septum midline, mucosa normal, no drainage    or sinus tenderness  Throat:   Lips, mucosa, and tongue normal; teeth and gums normal  Neck:   Supple, symmetrical, trachea midline, no adenopathy;      Back:     Symmetric, no curvature, ROM normal, no CVA tenderness  Lungs:     Clear to auscultation bilaterally, respirations unlabored  Chest Wall:    No tenderness or deformity    Heart:    Regular rate and rhythm, S1 and S2 normal, no murmur, rub   or gallop     Abdomen:     Soft, non-tender, bowel sounds active all four quadrants,    no masses, no organomegaly  Genitalia:    At 12 o'clock of mons pubis, 2 x 2 cm patch of exfoliation, superficial not bleeding     Extremities:   Non-pitting edema, c/w chronic lymphedema, and lichenification. Onychomycosis of toelnails.  Pulses:   2+ and symmetric all extremities  Skin:   Partial thickness desquamation of skin to inguinal folds/pannus across abdomen. In addition to folds of skin on superior aspect of thighs  bilaterally.  Lymph nodes:   Cervical, supraclavicular, and axillary nodes normal  Neurologic:   CNII-XII intact, normal strength, sensation and reflexes    throughout   LABS: Results for orders placed during the hospital encounter of 04/27/12 (from the past 48 hour(s))  CBC     Status: Abnormal   Collection Time   04/27/12  9:31 PM      Component Value Range Comment   WBC 7.1  4.0 - 10.5 (K/uL)    RBC 3.48 (*) 3.87 - 5.11 (MIL/uL)    Hemoglobin 9.8 (*) 12.0 - 15.0 (g/dL)    HCT 45.4 (*) 09.8 - 46.0 (%)    MCV 88.2  78.0 - 100.0 (fL)    MCH 28.2  26.0 - 34.0 (pg)    MCHC 31.9  30.0 - 36.0 (g/dL)    RDW 11.9 (*) 14.7 - 15.5 (%)    Platelets 277  150 - 400 (K/uL)   BASIC METABOLIC PANEL     Status: Abnormal   Collection Time   04/27/12  9:31 PM      Component Value Range Comment   Sodium 141  135 - 145 (mEq/L)    Potassium 4.0  3.5 - 5.1 (mEq/L)    Chloride 106  96 - 112 (mEq/L)    CO2 23  19 - 32 (mEq/L)    Glucose, Bld 124 (*) 70 - 99 (mg/dL)    BUN 41 (*) 6 - 23 (mg/dL)    Creatinine, Ser 8.29 (*) 0.50 - 1.10 (mg/dL)    Calcium 8.7  8.4 - 10.5 (mg/dL)    GFR calc non Af Amer 21 (*) >90 (mL/min)    GFR calc Af Amer 24 (*) >90 (mL/min)   WOUND CULTURE     Status: Normal (Preliminary result)   Collection Time   04/27/12  9:54 PM      Component Value Range Comment   Specimen Description LEG       Special Requests Normal      Gram Stain        Value: RARE WBC PRESENT,BOTH PMN AND MONONUCLEAR     FEW SQUAMOUS EPITHELIAL CELLS PRESENT     NO ORGANISMS SEEN   Culture NO GROWTH      Report Status PENDING     GLUCOSE, CAPILLARY     Status: Abnormal   Collection Time   04/28/12  1:05 AM      Component Value Range Comment   Glucose-Capillary 131 (*) 70 - 99 (mg/dL)   MRSA PCR SCREENING     Status: Normal   Collection Time   04/28/12  4:48 AM      Component Value Range Comment   MRSA by PCR NEGATIVE  NEGATIVE    COMPREHENSIVE METABOLIC PANEL     Status: Abnormal   Collection Time   04/28/12  5:28 AM      Component Value Range Comment   Sodium 140  135 - 145 (mEq/L)    Potassium 3.9  3.5 - 5.1 (mEq/L)    Chloride 107  96 - 112 (mEq/L)    CO2 23  19 - 32 (mEq/L)    Glucose, Bld 171 (*) 70 - 99 (mg/dL)    BUN 37 (*) 6 - 23 (mg/dL)    Creatinine, Ser 5.62 (*) 0.50 - 1.10 (mg/dL)    Calcium 8.7  8.4 - 10.5 (mg/dL)    Total Protein 6.8  6.0 - 8.3 (g/dL)    Albumin 2.4 (*) 3.5 - 5.2 (  g/dL)    AST 19  0 - 37 (U/L)    ALT 16  0 - 35 (U/L)    Alkaline Phosphatase 126 (*) 39 - 117 (U/L)    Total Bilirubin 0.2 (*) 0.3 - 1.2 (mg/dL)    GFR calc non Af Amer 22 (*) >90 (mL/min)    GFR calc Af Amer 25 (*) >90 (mL/min)   CBC     Status: Abnormal   Collection Time   04/28/12  5:28 AM      Component Value Range Comment   WBC 6.2  4.0 - 10.5 (K/uL)    RBC 3.36 (*) 3.87 - 5.11 (MIL/uL)    Hemoglobin 9.2 (*) 12.0 - 15.0 (g/dL)    HCT 16.1 (*) 09.6 - 46.0 (%)    MCV 87.5  78.0 - 100.0 (fL)    MCH 27.4  26.0 - 34.0 (pg)    MCHC 31.3  30.0 - 36.0 (g/dL)    RDW 04.5 (*) 40.9 - 15.5 (%)    Platelets 230  150 - 400 (K/uL)   PROTIME-INR     Status: Abnormal   Collection Time   04/28/12  5:28 AM      Component Value Range Comment   Prothrombin Time 19.8 (*) 11.6 - 15.2 (seconds)    INR 1.65 (*) 0.00 - 1.49    GLUCOSE, CAPILLARY     Status: Abnormal   Collection Time   04/28/12  7:34 AM       Component Value Range Comment   Glucose-Capillary 139 (*) 70 - 99 (mg/dL)     MICRO:  IMAGING: No results found.  HISTORICAL MICRO/IMAGING  Assessment/Plan:    Exfoliative rash of groin/pannus/thighs possibly due to drug interaction. Does not appear cellulitic but rather exfoliative of unknown etiology. Will review literature to see how common this reaction with mtx and FQs can be. Wound care as dictated by WOC Rn.  Empiric antibiotics= can continue on vancomycin to cover gram positive organisms and skin flora (which would also cover MRSA). Can continue levofloxacin for pseudomonal coverage. Since she has numerous antibiotic allergies.  Psoriasis= MTX currently on hold.  Hx of PJI with group B strep= covered by both vanco and levo.  Will provide further recs as more results return.thank you for interesting consultation.  Duke Salvia Drue Second MD MPH [facility] [phone]

## 2012-04-28 NOTE — Progress Notes (Addendum)
ANTICOAGULATION CONSULT NOTE - Initial Consult  Pharmacy Consult for warfarin Indication: atrial fibrillation  Allergies  Allergen Reactions  . Daptomycin Other (See Comments)    Elevated CPK  . Morphine And Related Nausea And Vomiting  . Cephalexin Rash    unknown  . Celecoxib     unknown  . Codeine     unknown  . Fluoxetine Hcl     unknown  . Latex     REACTION: undefined  . Ofloxacin     unknown  . Penicillins     unknown  . Rofecoxib     unknown  . Sulfonamide Derivatives     unknown    Patient Measurements: Height: 5\' 3"  (160 cm) Weight: 259 lb 11.2 oz (117.8 kg) IBW/kg (Calculated) : 52.4  Heparin Dosing Weight:   Vital Signs: Temp: 97.8 F (36.6 C) (05/21 0055) Temp src: Oral (05/21 0055) BP: 183/52 mmHg (05/21 0055) Pulse Rate: 60  (05/21 0055)  Labs:  [name] 04/28/12 0528 04/27/12 2131  HGB 9.2* 9.8*  HCT 29.4* 30.7*  PLT 230 277  APTT -- --  LABPROT 19.8* --  INR 1.65* --  HEPARINUNFRC -- --  CREATININE 2.28* 2.39*  CKTOTAL -- --  CKMB -- --  TROPONINI -- --    Estimated Creatinine Clearance: 31.7 ml/min (by C-G formula based on Cr of 2.28).   Medical History: Past Medical History  Diagnosis Date  . Asthma   . Bronchitis   . Allergic rhinitis   . Diabetes mellitus   . Stroke   . HTN (hypertension)   . OSA (obstructive sleep apnea)   . DJD (degenerative joint disease)   . Kidney stones   . Depression   . Hyperlipidemia   . Complication of anesthesia     trouble waking up    Medications:  Prescriptions prior to admission  Medication Sig Dispense Refill  . albuterol-ipratropium (COMBIVENT) 18-103 MCG/ACT inhaler Inhale 2 puffs into the lungs 3 (three) times daily.  1 Inhaler  4  . allopurinol (ZYLOPRIM) 100 MG tablet Take 1 tablet by mouth daily.      [name] amiodarone (PACERONE) 200 MG tablet Take 200 mg by mouth daily.      [name] aspirin 81 MG tablet Take 81 mg by mouth daily.        [name] atorvastatin (LIPITOR) 20 MG tablet Take 20  mg by mouth at bedtime.      . clobetasol (TEMOVATE) 0.05 % ointment Apply 1 application topically daily. Use as directed on legs      . digoxin (LANOXIN) 0.125 MG tablet Take 1 tablet by mouth daily.      . fluticasone (FLOVENT HFA) 220 MCG/ACT inhaler Inhale 2 puffs into the lungs 3 (three) times daily.        . furosemide (LASIX) 80 MG tablet Take 2 tablets (160 mg total) by mouth 2 (two) times daily.  120 tablet  3  . insulin aspart (NOVOLOG) 100 UNIT/ML injection Inject 0-20 Units into the skin 3 (three) times daily before meals.      [name] LANTUS 100 UNIT/ML injection Inject 10 Units into the skin at bedtime. 10 units at bedtime      . levofloxacin (LEVAQUIN) 500 MG tablet Take 500 mg by mouth daily.      . metoprolol tartrate (LOPRESSOR) 25 MG tablet Take 1 tablet (25 mg total) by mouth 2 (two) times daily.  60 tablet  3  . pantoprazole (PROTONIX) 40 MG tablet Take 40 mg  by mouth daily.      [name] spironolactone (ALDACTONE) 25 MG tablet Take 25 mg by mouth daily.        [name] warfarin (COUMADIN) 6 MG tablet Take 6 mg by mouth daily.      [name] zolpidem (AMBIEN) 5 MG tablet Take 5 mg by mouth at bedtime as needed. For sleep        Assessment: Patient on chronic warfarin for Afib.  INR < 2.  Home dose known.  Goal of Therapy:  INR 2-3    Plan:  Continue with home dose of warfarin. Daily INR.  Aleene Davidson Crowford 04/28/2012,6:41 AM   --------------------------------------------------------------------------------------------------------------------------------------------------------------------------- Addendum:  Note that last warfarin dose was taken on 5/19. Will reschedule warfarin dose to be given today at [facility] rather than usual 6pm since patient did not receive a dose yesterday.  Hessie Knows, PharmD, BCPS Pager [phone] 04/28/2012 9:53 AM

## 2012-04-28 NOTE — Consult Note (Signed)
WOC consult Note Reason for Consult: Consult requested for bilat groin and abd fold wounds.  Pt previously followed by dermatology for these sites, she states they assessed last week when she developed a rash.  Sites are now open and bleeding and very painful. Wound type: Full thickness Pressure Ulcer POA: No Measurement: left upper groin 10X3X.2cm,  Right upper groin 14X4X.2cm  Abd left fold 10X4X.2cm, Aabd right fold 9X3X.2cm All sites 100% red, bleeding small amt, sticking to sheets, very painful, no odor. Lower groin bilat inner areas have patchy areas of partial thickness skin loss and have evolved into dry scabbed areas without drainage or bleeding at this time. Dressing procedure/placement/frequency: Foam dressing to protect from rubbing together and sticking to sheets.  Hydrogel to promote moist healing.  Antifungal powder to inner folds and keep dry to avoid candidiasis. Pt can follow-up with dermatology after discharge for further plan of care.  Will not plan to follow further unless re-consulted.  [address] RN, MSN, [employer]  [phone]

## 2012-04-29 LAB — PROTIME-INR: INR: 1.47 (ref 0.00–1.49)

## 2012-04-29 LAB — GLUCOSE, CAPILLARY: Glucose-Capillary: 144 mg/dL — ABNORMAL HIGH (ref 70–99)

## 2012-04-29 MED ORDER — WARFARIN SODIUM 4 MG PO TABS
8.0000 mg | ORAL_TABLET | Freq: Once | ORAL | Status: AC
  Administered 2012-04-29: 8 mg via ORAL
  Filled 2012-04-29: qty 2

## 2012-04-29 NOTE — Progress Notes (Signed)
Pt heart rate in the 50's and 40's this am.  Digoxin, lopressor, and amiodarone held.  Email sent to Dr [name] per his office request.

## 2012-04-29 NOTE — Progress Notes (Signed)
ANTICOAGULATION CONSULT NOTE - Follow up Consult  Pharmacy Consult for warfarin Indication: atrial fibrillation  Allergies  Allergen Reactions  . Daptomycin Other (See Comments)    Elevated CPK  . Morphine And Related Nausea And Vomiting  . Cephalexin Rash    unknown  . Celecoxib     unknown  . Codeine     unknown  . Fluoxetine Hcl     unknown  . Latex     REACTION: undefined  . Ofloxacin     unknown  . Penicillins     unknown  . Rofecoxib     unknown  . Sulfonamide Derivatives     unknown    Patient Measurements: Height: 5\' 3"  (160 cm) Weight: 259 lb 11.2 oz (117.8 kg) IBW/kg (Calculated) : 52.4   Vital Signs: Temp: 97.8 F (36.6 C) (05/22 0500) Temp src: Oral (05/22 0500) BP: 188/70 mmHg (05/22 0500) Pulse Rate: 61  (05/22 0500)  Labs:  [name] 04/29/12 0508 04/28/12 0528 04/27/12 2131  HGB -- 9.2* 9.8*  HCT -- 29.4* 30.7*  PLT -- 230 277  APTT -- -- --  LABPROT 18.1* 19.8* --  INR 1.47 1.65* --  HEPARINUNFRC -- -- --  CREATININE -- 2.28* 2.39*  CKTOTAL -- -- --  CKMB -- -- --  TROPONINI -- -- --    Estimated Creatinine Clearance: 31.7 ml/min (by C-G formula based on Cr of 2.28).   Medications:     . allopurinol  100 mg Oral Daily  . amiodarone  200 mg Oral Daily  . aspirin EC  81 mg Oral Daily  . atorvastatin  20 mg Oral q1800  . clobetasol ointment  1 application Topical Daily  . digoxin  125 mcg Oral Daily  . fluticasone  2 puff Inhalation BID  . furosemide  160 mg Oral BID  . insulin glargine  10 Units Subcutaneous QHS  . Ipratropium-Albuterol  1 puff Inhalation TID  . levofloxacin  500 mg Oral QHS  . metoprolol tartrate  25 mg Oral BID  . pantoprazole  40 mg Oral Daily  . senna  1 tablet Oral BID  . spironolactone  25 mg Oral Daily  . warfarin  6 mg Oral Once  . Warfarin - Pharmacist Dosing Inpatient   Does not apply q1800     Assessment:  Patient on chronic warfarin 6mg  daily for Afib.    INR decreased to 1.47,  subtherapeutic  CBC stable, no complications reported  Goal of Therapy:  INR 2-3    Plan:   Warfarin 8mg  today (boosted dose) x1 at 1800  Daily INR   Lynann Beaver PharmD, BCPS Pager [phone] 04/29/2012 3:16 PM

## 2012-04-29 NOTE — Progress Notes (Signed)
Subjective:  Patient reports she's feeling better today. She's had transient lower discomfort after her abdomen was probed yesterday. This is much better today. She's had some abdominal bloating and gas but no nausea vomiting. Patient denies chest pains or shortness of breath.patient did experience transient bradycardia today. She was asymptomatic. Her cardiac medication was adjusted. No other new complaints. Appreciate ID followup at this time.   Allergies  Allergen Reactions  . Daptomycin Other (See Comments)    Elevated CPK  . Morphine And Related Nausea And Vomiting  . Cephalexin Rash    unknown  . Celecoxib     unknown  . Codeine     unknown  . Fluoxetine Hcl     unknown  . Latex     REACTION: undefined  . Ofloxacin     unknown  . Penicillins     unknown  . Rofecoxib     unknown  . Sulfonamide Derivatives     unknown   Current Facility-Administered Medications  Medication Dose Route Frequency Provider Last Rate Last Dose  . allopurinol (ZYLOPRIM) tablet 100 mg  100 mg Oral Daily Rhetta Mura, MD   100 mg at 04/29/12 0923  . amiodarone (PACERONE) tablet 200 mg  200 mg Oral Daily Rhetta Mura, MD   200 mg at 04/28/12 0909  . aspirin EC tablet 81 mg  81 mg Oral Daily Rhetta Mura, MD   81 mg at 04/29/12 0923  . atorvastatin (LIPITOR) tablet 20 mg  20 mg Oral q1800 Gwenyth Bender, MD   20 mg at 04/29/12 1709  . clobetasol ointment (TEMOVATE) 0.05 % 1 application  1 application Topical Daily Rhetta Mura, MD   1 application at 04/29/12 1345  . digoxin (LANOXIN) tablet 125 mcg  125 mcg Oral Daily Rhetta Mura, MD   125 mcg at 04/28/12 0906  . fluticasone (FLOVENT HFA) 220 MCG/ACT inhaler 2 puff  2 puff Inhalation BID Gwenyth Bender, MD   2 puff at 04/29/12 2014  . furosemide (LASIX) tablet 160 mg  160 mg Oral BID Rhetta Mura, MD   160 mg at 04/29/12 1709  . insulin glargine (LANTUS) injection 10 Units  10 Units Subcutaneous QHS Rhetta Mura, MD   10 Units at 04/29/12 2141  . Ipratropium-Albuterol (COMBIVENT) respimat 1 puff  1 puff Inhalation TID Gwenyth Bender, MD   1 puff at 04/29/12 2014  . levofloxacin (LEVAQUIN) tablet 500 mg  500 mg Oral QHS Rhetta Mura, MD   500 mg at 04/29/12 2139  . metoprolol tartrate (LOPRESSOR) tablet 25 mg  25 mg Oral BID Rhetta Mura, MD   25 mg at 04/29/12 2139  . oxyCODONE (Oxy IR/ROXICODONE) immediate release tablet 5 mg  5 mg Oral Q4H PRN Rhetta Mura, MD   5 mg at 04/29/12 2139  . pantoprazole (PROTONIX) EC tablet 40 mg  40 mg Oral Daily Rhetta Mura, MD   40 mg at 04/29/12 0923  . senna (SENOKOT) tablet 8.6 mg  1 tablet Oral BID Rhetta Mura, MD   8.6 mg at 04/29/12 0923  . spironolactone (ALDACTONE) tablet 25 mg  25 mg Oral Daily Rhetta Mura, MD   25 mg at 04/29/12 0923  . warfarin (COUMADIN) tablet 8 mg  8 mg Oral ONCE-1800 Christine E Shade, PHARMD   8 mg at 04/29/12 1709  . Warfarin - Pharmacist Dosing Inpatient   Does not apply q1800 Rhetta Mura, MD      . zolpidem (AMBIEN) tablet 5 mg  5 mg Oral QHS PRN Rhetta Mura, MD   5 mg at 04/29/12 0116    Objective: Blood pressure 190/81, pulse 59, temperature 98.4 F (36.9 C), temperature source Oral, resp. rate 18, height 5\' 3"  (1.6 m), weight 259 lb 11.2 oz (117.8 kg), SpO2 95.00%.  Well-developed obese black female in no acute distress. HEENT:no sinus tenderness. NECK:no posterior cervical nodes. LUNGS:clear to auscultation. Decreased breath sounds at bases. No wheezes of vocal fremitus. RU:EAVWUJ S1, S2 without S3. WJX:BJYNWG bowel sounds. No rushes. NFA:OZHYQMVH Homans. NEURO:nonfocal.  SKIN: As noted per infectious disease.  Lab results: Results for orders placed during the hospital encounter of 04/27/12 (from the past 48 hour(s))  GLUCOSE, CAPILLARY     Status: Abnormal   Collection Time   04/28/12  1:05 AM      Component Value Range Comment   Glucose-Capillary 131  (*) 70 - 99 (mg/dL)   MRSA PCR SCREENING     Status: Normal   Collection Time   04/28/12  4:48 AM      Component Value Range Comment   MRSA by PCR NEGATIVE  NEGATIVE    COMPREHENSIVE METABOLIC PANEL     Status: Abnormal   Collection Time   04/28/12  5:28 AM      Component Value Range Comment   Sodium 140  135 - 145 (mEq/L)    Potassium 3.9  3.5 - 5.1 (mEq/L)    Chloride 107  96 - 112 (mEq/L)    CO2 23  19 - 32 (mEq/L)    Glucose, Bld 171 (*) 70 - 99 (mg/dL)    BUN 37 (*) 6 - 23 (mg/dL)    Creatinine, Ser 8.46 (*) 0.50 - 1.10 (mg/dL)    Calcium 8.7  8.4 - 10.5 (mg/dL)    Total Protein 6.8  6.0 - 8.3 (g/dL)    Albumin 2.4 (*) 3.5 - 5.2 (g/dL)    AST 19  0 - 37 (U/L)    ALT 16  0 - 35 (U/L)    Alkaline Phosphatase 126 (*) 39 - 117 (U/L)    Total Bilirubin 0.2 (*) 0.3 - 1.2 (mg/dL)    GFR calc non Af Amer 22 (*) >90 (mL/min)    GFR calc Af Amer 25 (*) >90 (mL/min)   CBC     Status: Abnormal   Collection Time   04/28/12  5:28 AM      Component Value Range Comment   WBC 6.2  4.0 - 10.5 (K/uL)    RBC 3.36 (*) 3.87 - 5.11 (MIL/uL)    Hemoglobin 9.2 (*) 12.0 - 15.0 (g/dL)    HCT 96.2 (*) 95.2 - 46.0 (%)    MCV 87.5  78.0 - 100.0 (fL)    MCH 27.4  26.0 - 34.0 (pg)    MCHC 31.3  30.0 - 36.0 (g/dL)    RDW 84.1 (*) 32.4 - 15.5 (%)    Platelets 230  150 - 400 (K/uL)   PROTIME-INR     Status: Abnormal   Collection Time   04/28/12  5:28 AM      Component Value Range Comment   Prothrombin Time 19.8 (*) 11.6 - 15.2 (seconds)    INR 1.65 (*) 0.00 - 1.49    GLUCOSE, CAPILLARY     Status: Abnormal   Collection Time   04/28/12  7:34 AM      Component Value Range Comment   Glucose-Capillary 139 (*) 70 - 99 (mg/dL)   GLUCOSE, CAPILLARY  Status: Abnormal   Collection Time   04/28/12 11:22 AM      Component Value Range Comment   Glucose-Capillary 127 (*) 70 - 99 (mg/dL)   GLUCOSE, CAPILLARY     Status: Abnormal   Collection Time   04/28/12  4:20 PM      Component Value Range Comment    Glucose-Capillary 170 (*) 70 - 99 (mg/dL)    Comment 1 Notify RN     PROTIME-INR     Status: Abnormal   Collection Time   04/29/12  5:08 AM      Component Value Range Comment   Prothrombin Time 18.1 (*) 11.6 - 15.2 (seconds)    INR 1.47  0.00 - 1.49    GLUCOSE, CAPILLARY     Status: Abnormal   Collection Time   04/29/12 12:32 PM      Component Value Range Comment   Glucose-Capillary 144 (*) 70 - 99 (mg/dL)   GLUCOSE, CAPILLARY     Status: Abnormal   Collection Time   04/29/12  5:41 PM      Component Value Range Comment   Glucose-Capillary 135 (*) 70 - 99 (mg/dL)     Studies/Results: No results found.  Patient Active Problem List  Diagnoses  . HYPERLIPIDEMIA  . OBSTRUCTIVE SLEEP APNEA  . HYPERTENSION  . ATRIAL FIBRILLATION WITH RAPID VENTRICULAR RESPONSE  . Acute combined systolic and diastolic heart failure  . 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 BRONCHITIS, RECURRENT  . ASTHMA  . Obesity hypoventilation syndrome  . Prosthetic joint infection  . Group B streptococcal infection  . Gout  . Yeast infection involving the vagina and surrounding area  . SBO (small bowel obstruction)  . DM (diabetes mellitus)  . Chronic anticoagulation  . Oral candidiasis  . Laceration of leg  . Chronic venous insufficiency    Impression: Exfoliative rash possibly secondary to drug interaction. Rule out wound infection, doubtful. Diabetes mellitus. Psoriasis previously on methotrexate. History of atrial fibrillation controlled with amiodarone. Transient bradycardia. Rule out adverse effect of medication. Sleep apnea. Morbid obesity.   Plan: Agree with conservative therapy at this time with minimizing antibiotics. Continue local wound care. Followup CBC, CMET and dig level in a.m. Reviewed treatment options for psoriasis. She was previously followed at [facility] for this.   [name], Vickee Mormino 04/29/2012 10:11 PM

## 2012-04-29 NOTE — Progress Notes (Signed)
INFECTIOUS DISEASE PROGRESS NOTE  ID: [name] is a 61 y.o. 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  Subjective: Afebrile. Having better pain control. She is wondering if she can work with physical therapy. Have a walker ,and bedside commode  Abtx:   vanco #2 Levofloxacin #2 (but previously taking levofloxacin)   Medications:     . allopurinol  100 mg Oral Daily  . amiodarone  200 mg Oral Daily  . aspirin EC  81 mg Oral Daily  . atorvastatin  20 mg Oral q1800  . clobetasol ointment  1 application Topical Daily  . digoxin  125 mcg Oral Daily  . fluticasone  2 puff Inhalation BID  . furosemide  160 mg Oral BID  . insulin glargine  10 Units Subcutaneous QHS  . Ipratropium-Albuterol  1 puff Inhalation TID  . levofloxacin  500 mg Oral QHS  . metoprolol tartrate  25 mg Oral BID  . pantoprazole  40 mg Oral Daily  . senna  1 tablet Oral BID  . spironolactone  25 mg Oral Daily  . warfarin  6 mg Oral Once  . Warfarin - Pharmacist Dosing Inpatient   Does not apply q1800    Objective: Vital signs in last 24 hours: Temp:  [97.6 F (36.4 C)-97.8 F (36.6 C)] 97.8 F (36.6 C) (05/22 0500) Pulse Rate:  [60-61] 61  (05/22 0500) Resp:  [18] 18  (05/22 0500) BP: (184-192)/(69-70) 188/70 mmHg (05/22 0500) SpO2:  [90 %-96 %] 94 % (05/22 0747)  Gen= a xo by 3, in NAD HEENT= perrla, eomi, MMM, op clear Pulm= ctab, no w/c/r Abd= obese contour, nontender Skin = unchanged, partial thickness exfoliation clean wound bed involving lower abdomen/pannus, thighs and mons pubis Lab Results  Basename 04/28/12 0528 04/27/12 2131  WBC 6.2 7.1  HGB 9.2* 9.8*  HCT 29.4* 30.7*  NA 140 141  K 3.9 4.0  CL 107 106  CO2 23 23  BUN 37* 41*  CREATININE 2.28* 2.39*  GLU -- --   Liver  Panel  Basename 04/28/12 0528  PROT 6.8  ALBUMIN 2.4*  AST 19  ALT 16  ALKPHOS 126*  BILITOT 0.2*  BILIDIR --  IBILI --    Microbiology: Recent Results (from the past 240 hour(s))  WOUND CULTURE     Status: Normal (Preliminary result)   Collection Time   04/27/12  9:54 PM      Component Value Range Status Comment   Specimen Description LEG   Final    Special Requests Normal   Final    Gram Stain     Final    Value: RARE WBC PRESENT,BOTH PMN AND MONONUCLEAR     FEW SQUAMOUS EPITHELIAL CELLS PRESENT     NO ORGANISMS SEEN   Culture FEW GRAM NEGATIVE RODS   Final    Report Status PENDING   Incomplete   MRSA PCR SCREENING     Status: Normal   Collection Time   04/28/12  4:48 AM      Component Value Range Status Comment   MRSA by PCR NEGATIVE  NEGATIVE  Final     Studies/Results: No results found.   Assessment/Plan: Toxic skin exfoliative skin eruption =   Reviewing literature it does appear that a rare side effect of fluoroquinolones, predominantly cipro, can cause  methotrexate drugs levels to be elevated, thus increasing toxicity, which potentially can lead to toxic skin eruptions, although these are rarely reported and mostly occur at onset of initiation of medications. She has been on levofloxacin since July/aug 2012.  Will ask pharmacy to review list to see what other medications are associated with drug rash eruptions with MTX, and minimize any unnecessary drugs.  Will discuss with Dr. [name] what options he is pursuing for management of psoriasis.  PJI chronic suppression = she has been on levofloxacin for many months, since her original infection was June 2102. One option is to discontinue levofloxacin at this point.  Possible bacterial super-infection = superficial swab is growing a few GNR. Her wounds are partial thickness, but looks good. I don't think that they appear infected. One option to is discontinue abtx entirely and focus on local wound care, vs. Short  course of tx with 5-7days. Would avoid unnecessary antibiotics since it increases risk for multidrug resistant organism.  Matin [name] Infectious Diseases 04/29/2012, 1:24 PM

## 2012-04-29 NOTE — Progress Notes (Signed)
Patient requested that her CBG be checked before she ate lunch.  CBG was 144.  Patient reports that she checks her CBG before every meal.  MD-Please order CBG's upon rounding.  THanks!

## 2012-04-30 DIAGNOSIS — L0390 Cellulitis, unspecified: Secondary | ICD-10-CM

## 2012-04-30 DIAGNOSIS — L0291 Cutaneous abscess, unspecified: Secondary | ICD-10-CM

## 2012-04-30 LAB — COMPREHENSIVE METABOLIC PANEL
ALT: 17 U/L (ref 0–35)
Alkaline Phosphatase: 131 U/L — ABNORMAL HIGH (ref 39–117)
CO2: 30 mEq/L (ref 19–32)
Calcium: 9.2 mg/dL (ref 8.4–10.5)
GFR calc Af Amer: 22 mL/min — ABNORMAL LOW (ref >90)
GFR calc non Af Amer: 19 mL/min — ABNORMAL LOW (ref >90)
Glucose, Bld: 151 mg/dL — ABNORMAL HIGH (ref 70–99)
Sodium: 137 mEq/L (ref 135–145)

## 2012-04-30 LAB — DIFFERENTIAL
Basophils Relative: 1 % (ref 0–1)
Eosinophils Relative: 3 % (ref 0–5)
Monocytes Absolute: 0.8 10*3/uL (ref 0.1–1.0)
Neutrophils Relative %: 78 % — ABNORMAL HIGH (ref 43–77)

## 2012-04-30 LAB — CBC
HCT: 33.8 % — ABNORMAL LOW (ref 36.0–46.0)
Hemoglobin: 10.4 g/dL — ABNORMAL LOW (ref 12.0–15.0)
MCH: 26.8 pg (ref 26.0–34.0)
MCV: 87.1 fL (ref 78.0–100.0)
RBC: 3.88 MIL/uL (ref 3.87–5.11)

## 2012-04-30 MED ORDER — WARFARIN SODIUM 6 MG PO TABS
6.0000 mg | ORAL_TABLET | Freq: Once | ORAL | Status: AC
  Administered 2012-04-30: 6 mg via ORAL
  Filled 2012-04-30: qty 1

## 2012-04-30 MED ORDER — LEVOFLOXACIN 250 MG PO TABS
250.0000 mg | ORAL_TABLET | Freq: Every day | ORAL | Status: DC
  Administered 2012-04-30 – 2012-05-05 (×6): 250 mg via ORAL
  Filled 2012-04-30 (×7): qty 1

## 2012-04-30 MED ORDER — INSULIN ASPART 100 UNIT/ML ~~LOC~~ SOLN: 4.0000 [IU] | Freq: Three times a day (TID) | SUBCUTANEOUS | Status: DC

## 2012-04-30 NOTE — Progress Notes (Signed)
Patient's heart rate went as low as 37 this am.  Holding am lopressor, digoxin, and amiodorone unless otherwise instructed to give by Dr. [name].  Patient's BUN 45 and CREAT 2.60 and patient on lasix.  Paged Dr. [name] with this information.  Awaiting orders.

## 2012-04-30 NOTE — Progress Notes (Addendum)
INFECTIOUS DISEASE PROGRESS NOTE  ID: [name] is a 61 y.o. 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  Subjective: Afebrile. Feeling better, less pain on right lower abdomen/pannus. She states that the rn feels that her wound sare getting better.  24hr = superficial swab culture showing proteus   Abtx:   vanco #2 Levofloxacin #3 (but previously taking levofloxacin)   Medications:     . allopurinol  100 mg Oral Daily  . amiodarone  200 mg Oral Daily  . aspirin EC  81 mg Oral Daily  . atorvastatin  20 mg Oral q1800  . clobetasol ointment  1 application Topical Daily  . digoxin  125 mcg Oral Daily  . fluticasone  2 puff Inhalation BID  . furosemide  160 mg Oral BID  . insulin glargine  10 Units Subcutaneous QHS  . Ipratropium-Albuterol  1 puff Inhalation TID  . levofloxacin  500 mg Oral QHS  . pantoprazole  40 mg Oral Daily  . senna  1 tablet Oral BID  . spironolactone  25 mg Oral Daily  . warfarin  6 mg Oral ONCE-1800  . warfarin  8 mg Oral ONCE-1800  . Warfarin - Pharmacist Dosing Inpatient   Does not apply q1800  . DISCONTD: metoprolol tartrate  25 mg Oral BID    Objective: Vital signs in last 24 hours: Temp:  [97.4 F (36.3 C)-98.5 F (36.9 C)] 97.4 F (36.3 C) (05/23 1414) Pulse Rate:  [46-59] 49  (05/23 1414) Resp:  [17-20] 20  (05/23 1414) BP: (174-190)/(59-84) 190/84 mmHg (05/23 1414) SpO2:  [92 %-97 %] 93 % (05/23 1414)  Gen= a xo by 3, in NAD HEENT= perrla, eomi, MMM, op clear Pulm= ctab, no w/c/r Abd= obese contour, nontender Skin = partial thickness exfoliation clean wound bed starting to granulate involving lower abdomen/pannus, thighs and mons pubis. No new lesions  Lab Results  [facility] 04/30/12 0615 04/28/12 0528  WBC 8.7 6.2  HGB 10.4* 9.2*  HCT  33.8* 29.4*  NA 137 140  K 4.1 3.9  CL 97 107  CO2 30 23  BUN 45* 37*  CREATININE 2.60* 2.28*  GLU -- --   Liver Panel  Basename 04/30/12 0615 04/28/12 0528  PROT 7.7 6.8  ALBUMIN 3.0* 2.4*  AST 20 19  ALT 17 16  ALKPHOS 131* 126*  BILITOT 0.1* 0.2*  BILIDIR -- --  IBILI -- --    Microbiology: Recent Results (from the past 240 hour(s))  WOUND CULTURE     Status: Normal   Collection Time   04/27/12  9:54 PM      Component Value Range Status Comment   Specimen Description LEG   Final    Special Requests Normal   Final    Gram Stain     Final    Value: RARE WBC PRESENT,BOTH PMN AND MONONUCLEAR     FEW SQUAMOUS EPITHELIAL CELLS PRESENT     NO ORGANISMS SEEN   Culture FEW PROTEUS MIRABILIS   Final    Report Status 04/30/2012 FINAL   Final    Organism ID, Bacteria PROTEUS MIRABILIS   Final   MRSA PCR SCREENING     Status: Normal   Collection Time   04/28/12  4:48 AM      Component Value Range Status Comment  MRSA by PCR NEGATIVE  NEGATIVE  Final     Studies/Results: No results found.   Assessment/Plan: Toxic skin exfoliative skin eruption = thought to be related to drug interaction between rare side effect of fluoroquinolones, predominantly cipro,and MTXH, which can cause methotrexate drugs levels to be elevated, thus increasing toxicity, which potentially can lead to toxic skin eruptions.  Patient has discontinued methotrexate.  PJI chronic suppression = will continue with chronic suppression for prosthetic joint infection with Group B strep. Since she is not being treated for psoriasis at this time, would like to continue with levofloxacin 250mg  po daily.  Possible bacterial super-infection = superficial swab is growing proteus mirabalis (R- fluoroquinolones). I feel that still ok to continue with not treating superficial wound culture results and focus on local wound care. Will ask lab to check for aztreonam susceptibilities.  Multiple drug allergies= if patient  requires treatment for gram negative organism that resistant to fluoroquinolone, we may need to do tests doses of other abtx given her allergy history.  Psoriasis = currently not on treatment. She states that her psoriasis is mild. May benefit from a treatment holiday. Then re-establish need for treatment by her dermatologist.  Drue Second, [name] Infectious Diseases 04/30/2012, 2:38 PM

## 2012-04-30 NOTE — Progress Notes (Signed)
Subjective:  Appreciate cardiology evaluation. Patient resting comfortably this evening. She hours been noted to have bradycardia during the day. Her blood pressure was maintained even with rates less than 40. Patient denies chest pains or shortness of breath. She was unaware of any palpitations. Patient was seen in consultation by Dr. Sharyn Lull of cardiology. Her medications have been adjusted. She otherwise reports her abdominal discomfort has improved. She has not had a recent bowel movement.   Allergies  Allergen Reactions  . Daptomycin Other (See Comments)    Elevated CPK  . Morphine And Related Nausea And Vomiting  . Cephalexin Rash    unknown  . Celecoxib     unknown  . Codeine     unknown  . Fluoxetine Hcl     unknown  . Latex     REACTION: undefined  . Ofloxacin     unknown  . Penicillins     unknown  . Rofecoxib     unknown  . Sulfonamide Derivatives     unknown   Current Facility-Administered Medications  Medication Dose Route Frequency Provider Last Rate Last Dose  . allopurinol (ZYLOPRIM) tablet 100 mg  100 mg Oral Daily Rhetta Mura, MD   100 mg at 04/30/12 1027  . amiodarone (PACERONE) tablet 200 mg  200 mg Oral Daily Rhetta Mura, MD   200 mg at 04/28/12 0909  . aspirin EC tablet 81 mg  81 mg Oral Daily Rhetta Mura, MD   81 mg at 04/30/12 1027  . atorvastatin (LIPITOR) tablet 20 mg  20 mg Oral q1800 Gwenyth Bender, MD   20 mg at 04/30/12 1807  . clobetasol ointment (TEMOVATE) 0.05 % 1 application  1 application Topical Daily Rhetta Mura, MD   1 application at 04/30/12 1000  . digoxin (LANOXIN) tablet 125 mcg  125 mcg Oral Daily Rhetta Mura, MD   125 mcg at 04/28/12 0906  . fluticasone (FLOVENT HFA) 220 MCG/ACT inhaler 2 puff  2 puff Inhalation BID Gwenyth Bender, MD   2 puff at 04/30/12 2039  . furosemide (LASIX) tablet 160 mg  160 mg Oral BID Rhetta Mura, MD   160 mg at 04/30/12 1807  . insulin aspart (novoLOG) injection  4-10 Units  4-10 Units Subcutaneous TID WC Gwenyth Bender, MD      . insulin glargine (LANTUS) injection 10 Units  10 Units Subcutaneous QHS Rhetta Mura, MD   10 Units at 04/30/12 2055  . Ipratropium-Albuterol (COMBIVENT) respimat 1 puff  1 puff Inhalation TID Gwenyth Bender, MD   1 puff at 04/30/12 2039  . levofloxacin (LEVAQUIN) tablet 250 mg  250 mg Oral QHS Judyann Munson, MD   250 mg at 04/30/12 2057  . oxyCODONE (Oxy IR/ROXICODONE) immediate release tablet 5 mg  5 mg Oral Q4H PRN Rhetta Mura, MD   5 mg at 04/29/12 2139  . pantoprazole (PROTONIX) EC tablet 40 mg  40 mg Oral Daily Rhetta Mura, MD   40 mg at 04/30/12 1027  . senna (SENOKOT) tablet 8.6 mg  1 tablet Oral BID Rhetta Mura, MD   8.6 mg at 04/30/12 1027  . spironolactone (ALDACTONE) tablet 25 mg  25 mg Oral Daily Rhetta Mura, MD   25 mg at 04/30/12 1027  . warfarin (COUMADIN) tablet 6 mg  6 mg Oral ONCE-1800 Loma Messing [place], PHARMD   6 mg at 04/30/12 1807  . Warfarin - Pharmacist Dosing Inpatient   Does not apply [id number] Rhetta Mura, MD      .  zolpidem (AMBIEN) tablet 5 mg  5 mg Oral QHS PRN Rhetta Mura, MD   5 mg at 04/29/12 2252  . DISCONTD: levofloxacin (LEVAQUIN) tablet 500 mg  500 mg Oral QHS Rhetta Mura, MD   500 mg at 04/29/12 2139  . DISCONTD: metoprolol tartrate (LOPRESSOR) tablet 25 mg  25 mg Oral BID Rhetta Mura, MD   25 mg at 04/29/12 2139    Objective: Blood pressure 188/66, pulse 57, temperature 97.4 F (36.3 C), temperature source Oral, resp. rate 18, height 5\' 3"  (1.6 m), weight 259 lb 11.2 oz (117.8 kg), SpO2 93.00%.  Well-developed obese black female presently in no acute distress. HEENT:no sinus tenderness. NECK:no posterior cervical nodes. LUNGS:decreased breath sounds at bases. No wheezes or rales. WR:UEAVWU S1, S2. No rub appreciated. JWJ:XBJY tenderness in the lower region. Bowel sounds present. NWG:NFAOZHYQ Homans. 1+ nonpitting  edema. NEURO:nonfocal. SKIN: As noted per wound care and infectious disease. Lab results: Results for orders placed during the hospital encounter of 04/27/12 (from the past 48 hour(s))  PROTIME-INR     Status: Abnormal   Collection Time   04/29/12  5:08 AM      Component Value Range Comment   Prothrombin Time 18.1 (*) 11.6 - 15.2 (seconds)    INR 1.47  0.00 - 1.49    GLUCOSE, CAPILLARY     Status: Abnormal   Collection Time   04/29/12 12:32 PM      Component Value Range Comment   Glucose-Capillary 144 (*) 70 - 99 (mg/dL)   GLUCOSE, CAPILLARY     Status: Abnormal   Collection Time   04/29/12  5:41 PM      Component Value Range Comment   Glucose-Capillary 135 (*) 70 - 99 (mg/dL)   PROTIME-INR     Status: Abnormal   Collection Time   04/30/12  6:15 AM      Component Value Range Comment   Prothrombin Time 20.8 (*) 11.6 - 15.2 (seconds)    INR 1.76 (*) 0.00 - 1.49    DIGOXIN LEVEL     Status: Normal   Collection Time   04/30/12  6:15 AM      Component Value Range Comment   Digoxin Level 0.8  0.8 - 2.0 (ng/mL)   CBC     Status: Abnormal   Collection Time   04/30/12  6:15 AM      Component Value Range Comment   WBC 8.7  4.0 - 10.5 (K/uL)    RBC 3.88  3.87 - 5.11 (MIL/uL)    Hemoglobin 10.4 (*) 12.0 - 15.0 (g/dL)    HCT 65.7 (*) 84.6 - 46.0 (%)    MCV 87.1  78.0 - 100.0 (fL)    MCH 26.8  26.0 - 34.0 (pg)    MCHC 30.8  30.0 - 36.0 (g/dL)    RDW 96.2 (*) 95.2 - 15.5 (%)    Platelets 340  150 - 400 (K/uL)   DIFFERENTIAL     Status: Abnormal   Collection Time   04/30/12  6:15 AM      Component Value Range Comment   Neutrophils Relative 78 (*) 43 - 77 (%)    Lymphocytes Relative 9 (*) 12 - 46 (%)    Monocytes Relative 9  3 - 12 (%)    Eosinophils Relative 3  0 - 5 (%)    Basophils Relative 1  0 - 1 (%)    Neutro Abs 6.7  1.7 - 7.7 (K/uL)    Lymphs Abs  0.8  0.7 - 4.0 (K/uL)    Monocytes Absolute 0.8  0.1 - 1.0 (K/uL)    Eosinophils Absolute 0.3  0.0 - 0.7 (K/uL)    Basophils  Absolute 0.1  0.0 - 0.1 (K/uL)    WBC Morphology MILD LEFT SHIFT (1-5% METAS, OCC MYELO, OCC BANDS)     COMPREHENSIVE METABOLIC PANEL     Status: Abnormal   Collection Time   04/30/12  6:15 AM      Component Value Range Comment   Sodium 137  135 - 145 (mEq/L)    Potassium 4.1  3.5 - 5.1 (mEq/L)    Chloride 97  96 - 112 (mEq/L)    CO2 30  19 - 32 (mEq/L)    Glucose, Bld 151 (*) 70 - 99 (mg/dL)    BUN 45 (*) 6 - 23 (mg/dL)    Creatinine, Ser 0.86 (*) 0.50 - 1.10 (mg/dL)    Calcium 9.2  8.4 - 10.5 (mg/dL)    Total Protein 7.7  6.0 - 8.3 (g/dL)    Albumin 3.0 (*) 3.5 - 5.2 (g/dL)    AST 20  0 - 37 (U/L)    ALT 17  0 - 35 (U/L)    Alkaline Phosphatase 131 (*) 39 - 117 (U/L)    Total Bilirubin 0.1 (*) 0.3 - 1.2 (mg/dL)    GFR calc non Af Amer 19 (*) >90 (mL/min)    GFR calc Af Amer 22 (*) >90 (mL/min)     Studies/Results: No results found.  Patient Active Problem List  Diagnoses  . HYPERLIPIDEMIA  . OBSTRUCTIVE SLEEP APNEA  . HYPERTENSION  . ATRIAL FIBRILLATION WITH RAPID VENTRICULAR RESPONSE  . Acute combined systolic and diastolic heart failure  . 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 BRONCHITIS, RECURRENT  . ASTHMA  . Obesity hypoventilation syndrome  . Prosthetic joint infection  . Group B streptococcal infection  . Gout  . Yeast infection involving the vagina and surrounding area  . SBO (small bowel obstruction)  . DM (diabetes mellitus)  . Chronic anticoagulation  . Oral candidiasis  . Laceration of leg  . Chronic venous insufficiency    Impression: Exfoliative skin wound secondary to drug reaction versus other. Bradycardia presently being by cardiology. Medications adjusted.digoxin level therapeutic. 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 Presently off of methotrexate. Sleep apnea with hypoventilation syndrome. Morbid obesity. Diabetes mellitus. History of chronic noncompliance. This is improving.   Plan: Continue present therapy. CBGs   Ac and hs. Continuing care. Followup options for long-term care. Continued monitoring on telemetry. Adjustment of cardiac medications as indicated.   [name], Tacy Chavis 04/30/2012 10:39 PM

## 2012-04-30 NOTE — Progress Notes (Signed)
ANTICOAGULATION CONSULT NOTE - Follow up Consult  Pharmacy Consult for warfarin Indication: atrial fibrillation  Allergies  Allergen Reactions  . Daptomycin Other (See Comments)    Elevated CPK  . Morphine And Related Nausea And Vomiting  . Cephalexin Rash    unknown  . Celecoxib     unknown  . Codeine     unknown  . Fluoxetine Hcl     unknown  . Latex     REACTION: undefined  . Ofloxacin     unknown  . Penicillins     unknown  . Rofecoxib     unknown  . Sulfonamide Derivatives     unknown    Patient Measurements: Height: 5\' 3"  (160 cm) Weight: 259 lb 11.2 oz (117.8 kg) IBW/kg (Calculated) : 52.4   Vital Signs: Temp: 98.5 F (36.9 C) (05/23 0645) Temp src: Oral (05/23 0645) BP: 178/74 mmHg (05/23 0645) Pulse Rate: 46  (05/23 0645)  Labs:  [name] 04/30/12 0615 04/29/12 0508 04/28/12 0528 04/27/12 2131  HGB 10.4* -- 9.2* --  HCT 33.8* -- 29.4* 30.7*  PLT 340 -- 230 277  APTT -- -- -- --  LABPROT 20.8* 18.1* 19.8* --  INR 1.76* 1.47 1.65* --  HEPARINUNFRC -- -- -- --  CREATININE 2.60* -- 2.28* 2.39*  CKTOTAL -- -- -- --  CKMB -- -- -- --  TROPONINI -- -- -- --    Estimated Creatinine Clearance: 27.8 ml/min (by C-G formula based on Cr of 2.6).   Medications:     . allopurinol  100 mg Oral Daily  . amiodarone  200 mg Oral Daily  . aspirin EC  81 mg Oral Daily  . atorvastatin  20 mg Oral q1800  . clobetasol ointment  1 application Topical Daily  . digoxin  125 mcg Oral Daily  . fluticasone  2 puff Inhalation BID  . furosemide  160 mg Oral BID  . insulin glargine  10 Units Subcutaneous QHS  . Ipratropium-Albuterol  1 puff Inhalation TID  . levofloxacin  500 mg Oral QHS  . metoprolol tartrate  25 mg Oral BID  . pantoprazole  40 mg Oral Daily  . senna  1 tablet Oral BID  . spironolactone  25 mg Oral Daily  . warfarin  8 mg Oral ONCE-1800  . Warfarin - Pharmacist Dosing Inpatient   Does not apply q1800     Assessment:  61 yo F on chronic  coumadin PTA for A.Fib  Home dose = 6 mg daily    INR trending back toward goal after slight increase in dose yesterday  Drug Interaction: Levaquin.  Can increase INR  CBC stable, no complications reported  Goal of Therapy:  INR 2-3    Plan:   Warfarin 6mg  today  Daily INR   Camar Guyton, Loma Messing PharmD 11:29 AM 04/30/2012

## 2012-04-30 NOTE — Progress Notes (Signed)
Dressings changed to abdominal folds and bilateral upper inner thighs.  Small amount of bloody drainage noted, foul odor present.  Wounds cleansed with soap and water, dried and foam dressings applied.  Will continue to montior.

## 2012-04-30 NOTE — Consult Note (Signed)
Reason for Consult: Marked sinus bradycardia Referring Physician: Dr. Benjaman Kindler is an 61 y.o. female.  HPI: Patient is 61 year old female with past medical history significant for multiple medical problems i.e. mild coronary artery disease, hypertension, history of pontine CVA, history of recurrent atrial fibrillation status post TEE cardioversion in the past, remains in sinus rhythm, history of bronchial asthma, COPD, obstructive sleep apnea/obesity hypoventilation syndrome, degenerative joint disease, chronic septic left knee arthritis, on chronic antibiotic, morbid obesity, hypercholesteremia, chronic kidney disease stage IV, anemia of chronic disease, history of T. compensated congestive heart failure secondary to systolic and diastolic dysfunction, was admitted on 520 because of worsening lower abdominal rash. Cardiologic consultation is obtained as patient had marked sinus bradycardia heart rate in 30s earlier today. Patient's beta blocker has been DC'd and her heart rate has improved in the upper 40s to low 50s. Patient was seen in my office approximately 2 months ago and the was noted to be bradycardic and heart rate in 50s or transient channel blocker diltiazem 180 mg was also DC'd. Patient denies any dizziness lightheadedness or syncopal episode. Denies any chest pain nausea vomiting diaphoresis. Denies shortness of breath at present but states she was short of breath when she came to the hospital which has improved considerably. Patient does give history of PND orthopnea and leg swelling. Patient denies any fever or chills. Patient states overall she has the improved and feels better.   Past Medical History  Diagnosis Date  . Asthma   . Bronchitis   . Allergic rhinitis   . Diabetes mellitus   . Stroke   . HTN (hypertension)   . OSA (obstructive sleep apnea)   . DJD (degenerative joint disease)   . Kidney stones   . Depression   . Hyperlipidemia   . Complication of  anesthesia     trouble waking up    Past Surgical History  Procedure Date  . Appendectomy   . Cholecystectomy   . Total knee arthroplasty   . Total abdominal hysterectomy   . Back surgery   . Cystectomy     left hand    Family History  Problem Relation Age of Onset  . Heart attack Father   . Asthma Father   . Heart disease Paternal Uncle   . Rectal cancer Paternal Aunt   . Other Mother     mva    Social History:  reports that she has quit smoking. Her smoking use included Cigarettes. She has never used smokeless tobacco. She reports that she does not drink alcohol or use illicit drugs.  Allergies:  Allergies  Allergen Reactions  . Daptomycin Other (See Comments)    Elevated CPK  . Morphine And Related Nausea And Vomiting  . Cephalexin Rash    unknown  . Celecoxib     unknown  . Codeine     unknown  . Fluoxetine Hcl     unknown  . Latex     REACTION: undefined  . Ofloxacin     unknown  . Penicillins     unknown  . Rofecoxib     unknown  . Sulfonamide Derivatives     unknown    Medications: I have reviewed the patient's current medications.  Results for orders placed during the hospital encounter of 04/27/12 (from the past 48 hour(s))  PROTIME-INR     Status: Abnormal   Collection Time   04/29/12  5:08 AM      Component Value Range  Comment   Prothrombin Time 18.1 (*) 11.6 - 15.2 (seconds)    INR 1.47  0.00 - 1.49    GLUCOSE, CAPILLARY     Status: Abnormal   Collection Time   04/29/12 12:32 PM      Component Value Range Comment   Glucose-Capillary 144 (*) 70 - 99 (mg/dL)   GLUCOSE, CAPILLARY     Status: Abnormal   Collection Time   04/29/12  5:41 PM      Component Value Range Comment   Glucose-Capillary 135 (*) 70 - 99 (mg/dL)   PROTIME-INR     Status: Abnormal   Collection Time   04/30/12  6:15 AM      Component Value Range Comment   Prothrombin Time 20.8 (*) 11.6 - 15.2 (seconds)    INR 1.76 (*) 0.00 - 1.49    DIGOXIN LEVEL     Status: Normal     Collection Time   04/30/12  6:15 AM      Component Value Range Comment   Digoxin Level 0.8  0.8 - 2.0 (ng/mL)   CBC     Status: Abnormal   Collection Time   04/30/12  6:15 AM      Component Value Range Comment   WBC 8.7  4.0 - 10.5 (K/uL)    RBC 3.88  3.87 - 5.11 (MIL/uL)    Hemoglobin 10.4 (*) 12.0 - 15.0 (g/dL)    HCT 16.1 (*) 09.6 - 46.0 (%)    MCV 87.1  78.0 - 100.0 (fL)    MCH 26.8  26.0 - 34.0 (pg)    MCHC 30.8  30.0 - 36.0 (g/dL)    RDW 04.5 (*) 40.9 - 15.5 (%)    Platelets 340  150 - 400 (K/uL)   DIFFERENTIAL     Status: Abnormal   Collection Time   04/30/12  6:15 AM      Component Value Range Comment   Neutrophils Relative 78 (*) 43 - 77 (%)    Lymphocytes Relative 9 (*) 12 - 46 (%)    Monocytes Relative 9  3 - 12 (%)    Eosinophils Relative 3  0 - 5 (%)    Basophils Relative 1  0 - 1 (%)    Neutro Abs 6.7  1.7 - 7.7 (K/uL)    Lymphs Abs 0.8  0.7 - 4.0 (K/uL)    Monocytes Absolute 0.8  0.1 - 1.0 (K/uL)    Eosinophils Absolute 0.3  0.0 - 0.7 (K/uL)    Basophils Absolute 0.1  0.0 - 0.1 (K/uL)    WBC Morphology MILD LEFT SHIFT (1-5% METAS, OCC MYELO, OCC BANDS)     COMPREHENSIVE METABOLIC PANEL     Status: Abnormal   Collection Time   04/30/12  6:15 AM      Component Value Range Comment   Sodium 137  135 - 145 (mEq/L)    Potassium 4.1  3.5 - 5.1 (mEq/L)    Chloride 97  96 - 112 (mEq/L)    CO2 30  19 - 32 (mEq/L)    Glucose, Bld 151 (*) 70 - 99 (mg/dL)    BUN 45 (*) 6 - 23 (mg/dL)    Creatinine, Ser 8.11 (*) 0.50 - 1.10 (mg/dL)    Calcium 9.2  8.4 - 10.5 (mg/dL)    Total Protein 7.7  6.0 - 8.3 (g/dL)    Albumin 3.0 (*) 3.5 - 5.2 (g/dL)    AST 20  0 - 37 (U/L)  ALT 17  0 - 35 (U/L)    Alkaline Phosphatase 131 (*) 39 - 117 (U/L)    Total Bilirubin 0.1 (*) 0.3 - 1.2 (mg/dL)    GFR calc non Af Amer 19 (*) >90 (mL/min)    GFR calc Af Amer 22 (*) >90 (mL/min)     No results found.  Review of Systems  Constitutional: Negative for fever.  Eyes: Negative for  blurred vision and double vision.  Respiratory: Negative for hemoptysis and sputum production.   Cardiovascular: Positive for leg swelling and PND. Negative for chest pain, palpitations and orthopnea.  Gastrointestinal: Negative for nausea, vomiting and abdominal pain.  Skin: Positive for itching and rash.  Neurological: Negative for headaches.   Blood pressure 190/84, pulse 49, temperature 97.4 F (36.3 C), temperature source Oral, resp. rate 20, height 5\' 3"  (1.6 m), weight 117.8 kg (259 lb 11.2 oz), SpO2 93.00%. Physical Exam  Constitutional: She is oriented to person, place, and time. She appears well-developed and well-nourished.  HENT:  Head: Normocephalic and atraumatic.  Nose: Nose normal.  Mouth/Throat: No oropharyngeal exudate.  Eyes: Conjunctivae are normal. Pupils are equal, round, and reactive to light. Left eye exhibits no discharge. No scleral icterus.  Neck: Normal range of motion. Neck supple. No JVD present. No tracheal deviation present. No thyromegaly present.  Cardiovascular:       Sinus bradycardic on monitor S1 and S2 soft there is  soft systolic murmur and S3 gallop noted  Respiratory: Effort normal and breath sounds normal. No respiratory distress. She has no wheezes. She has no rales. She exhibits no tenderness.  GI: Bowel sounds are normal. She exhibits no distension. There is no tenderness. There is no rebound.  Musculoskeletal:       No clubbing cyanosis or edema  Lymphadenopathy:    She has no cervical adenopathy.  Neurological: She is alert and oriented to person, place, and time.    Assessment/Plan: Marked sinus bradycardia improved secondary to medication Mild decompensated systolic/diastolic heart failure secondary to volume overload improved Mild coronary artery disease History of A. fib flutter status post TEE assisted cardioversion Hypertension History of pontine CVA History of COPD/the last one History of obstructive sleep apnea obesity  hypoventilation syndrome Degenerative joint disease with septic left knee arthritis COPD Chronic kidney disease stage IV Anemia of chronic disease Plan DC by mouth Lopressor Check labs including TSH in a.m.   Robynn Pane 04/30/2012, 5:03 PM

## 2012-05-01 LAB — TSH: TSH: 4.835 u[IU]/mL — ABNORMAL HIGH (ref 0.350–4.500)

## 2012-05-01 LAB — CBC
MCH: 27.9 pg (ref 26.0–34.0)
MCHC: 31.5 g/dL (ref 30.0–36.0)
Platelets: 359 10*3/uL (ref 150–400)
RDW: 17.7 % — ABNORMAL HIGH (ref 11.5–15.5)

## 2012-05-01 LAB — BASIC METABOLIC PANEL
BUN: 51 mg/dL — ABNORMAL HIGH (ref 6–23)
Calcium: 9.3 mg/dL (ref 8.4–10.5)
Creatinine, Ser: 2.74 mg/dL — ABNORMAL HIGH (ref 0.50–1.10)
GFR calc non Af Amer: 17 mL/min — ABNORMAL LOW (ref >90)
Glucose, Bld: 144 mg/dL — ABNORMAL HIGH (ref 70–99)
Sodium: 139 mEq/L (ref 135–145)

## 2012-05-01 LAB — GLUCOSE, CAPILLARY

## 2012-05-01 MED ORDER — WARFARIN SODIUM 6 MG PO TABS
6.0000 mg | ORAL_TABLET | Freq: Once | ORAL | Status: AC
  Administered 2012-05-01: 6 mg via ORAL
  Filled 2012-05-01: qty 1

## 2012-05-01 MED ORDER — INSULIN ASPART 100 UNIT/ML ~~LOC~~ SOLN: 0.0000 [IU] | Freq: Three times a day (TID) | SUBCUTANEOUS | Status: DC

## 2012-05-01 MED ORDER — INSULIN ASPART 100 UNIT/ML ~~LOC~~ SOLN
0.0000 [IU] | Freq: Three times a day (TID) | SUBCUTANEOUS | Status: DC
  Administered 2012-05-01 – 2012-05-03 (×7): 2 [IU] via SUBCUTANEOUS
  Administered 2012-05-03: 3 [IU] via SUBCUTANEOUS
  Administered 2012-05-03: 2 [IU] via SUBCUTANEOUS
  Administered 2012-05-04: 1 [IU] via SUBCUTANEOUS
  Administered 2012-05-04: 3 [IU] via SUBCUTANEOUS
  Administered 2012-05-04: 2 [IU] via SUBCUTANEOUS
  Administered 2012-05-05: 3 [IU] via SUBCUTANEOUS
  Administered 2012-05-05 – 2012-05-06 (×3): 2 [IU] via SUBCUTANEOUS

## 2012-05-01 NOTE — Progress Notes (Signed)
Subjective:  Patient denies any chest pain or shortness of breath. States her skin rash is improving. Up in chair feeling better. No further episodes of marked sinus bradycardia heart rate stays in upper 50s to low 60s Objective:  Vital Signs in the last 24 hours: Temp:  [97.4 F (36.3 C)-98 F (36.7 C)] 98 F (36.7 C) (05/24 0538) Pulse Rate:  [49-62] 62  (05/24 0538) Resp:  [18-20] 18  (05/24 0538) BP: (188-190)/(66-84) 190/72 mmHg (05/24 0538) SpO2:  [91 %-95 %] 93 % (05/24 0945)  Intake/Output from previous day: 05/23 0701 - 05/24 0700 In: 720 [P.O.:720] Out: 3000 [Urine:3000] Intake/Output from this shift: Total I/O In: 240 [P.O.:240] Out: 700 [Urine:700]  Physical Exam: Neck: no adenopathy, no carotid bruit, no JVD and supple, symmetrical, trachea midline Lungs: clear to auscultation bilaterally Heart: regular rate and rhythm, S1, S2 normal and Soft systolic murmur and S3 gallop noted Abdomen: soft, non-tender; bowel sounds normal; no masses,  no organomegaly Extremities: extremities normal, atraumatic, no cyanosis or edema  Lab Results:  Basename 05/01/12 0450 04/30/12 0615  WBC 9.8 8.7  HGB 11.2* 10.4*  PLT 359 340    Basename 05/01/12 0450 04/30/12 0615  NA 139 137  K 3.9 4.1  CL 97 97  CO2 28 30  GLUCOSE 144* 151*  BUN 51* 45*  CREATININE 2.74* 2.60*   No results found for this basename: TROPONINI:2,CK,MB:2 in the last 72 hours Hepatic Function Panel  Basename 04/30/12 0615  PROT 7.7  ALBUMIN 3.0*  AST 20  ALT 17  ALKPHOS 131*  BILITOT 0.1*  BILIDIR --  IBILI --   No results found for this basename: CHOL in the last 72 hours No results found for this basename: PROTIME in the last 72 hours  Imaging: Imaging results have been reviewed and No results found.  Cardiac Studies:  Assessment/Plan:  Status post Marked sinus bradycardia  secondary to medication  Mild decompensated systolic/diastolic heart failure secondary to volume overload  improved  Mild coronary artery disease  History of A. fib flutter status post TEE assisted cardioversion  Hypertension  History of pontine CVA  History of COPD/the last one  History of obstructive sleep apnea obesity hypoventilation syndrome  Degenerative joint disease with septic left knee arthritis  COPD  Chronic kidney disease stage IV  Anemia of chronic disease Plan Continue present management May need to restart low-dose the metoprolol his heart rate stays above 70s  LOS: 4 days    Taygen Newsome N 05/01/2012, 12:37 PM

## 2012-05-01 NOTE — Progress Notes (Signed)
ANTICOAGULATION CONSULT NOTE - Follow up Consult  Pharmacy Consult for warfarin Indication: atrial fibrillation  Allergies  Allergen Reactions  . Daptomycin Other (See Comments)    Elevated CPK  . Morphine And Related Nausea And Vomiting  . Cephalexin Rash    unknown  . Celecoxib     unknown  . Codeine     unknown  . Fluoxetine Hcl     unknown  . Latex     REACTION: undefined  . Ofloxacin     unknown  . Penicillins     unknown  . Rofecoxib     unknown  . Sulfonamide Derivatives     unknown    Patient Measurements: Height: 5\' 3"  (160 cm) Weight: 259 lb 11.2 oz (117.8 kg) IBW/kg (Calculated) : 52.4   Vital Signs: Temp: 98 F (36.7 C) (05/24 0538) Temp src: Oral (05/24 0538) BP: 190/72 mmHg (05/24 0538) Pulse Rate: 62  (05/24 0538)  Labs:  Basename 05/01/12 0450 04/30/12 0615 04/29/12 0508  HGB 11.2* 10.4* --  HCT 35.5* 33.8* --  PLT 359 340 --  APTT -- -- --  LABPROT 22.6* 20.8* 18.1*  INR 1.95* 1.76* 1.47  HEPARINUNFRC -- -- --  CREATININE 2.74* 2.60* --  CKTOTAL -- -- --  CKMB -- -- --  TROPONINI -- -- --    Estimated Creatinine Clearance: 26.4 ml/min (by C-G formula based on Cr of 2.74).   Medications:     . allopurinol  100 mg Oral Daily  . amiodarone  200 mg Oral Daily  . aspirin EC  81 mg Oral Daily  . atorvastatin  20 mg Oral q1800  . clobetasol ointment  1 application Topical Daily  . digoxin  125 mcg Oral Daily  . fluticasone  2 puff Inhalation BID  . furosemide  160 mg Oral BID  . insulin aspart  0-9 Units Subcutaneous TID WC  . insulin aspart  4-10 Units Subcutaneous TID WC  . insulin glargine  10 Units Subcutaneous QHS  . Ipratropium-Albuterol  1 puff Inhalation TID  . levofloxacin  250 mg Oral QHS  . pantoprazole  40 mg Oral Daily  . senna  1 tablet Oral BID  . spironolactone  25 mg Oral Daily  . warfarin  6 mg Oral ONCE-1800  . Warfarin - Pharmacist Dosing Inpatient   Does not apply q1800  . DISCONTD: insulin aspart  0-9  Units Subcutaneous TID WC  . DISCONTD: levofloxacin  500 mg Oral QHS  . DISCONTD: metoprolol tartrate  25 mg Oral BID     Assessment:  61 yo F on chronic coumadin PTA for A.Fib  Home dose = 6 mg daily    INR trending toward goal, almost at goal, will continue home dose 6 mg  Drug Interaction: Levaquin.  Can increase INR  CBC stable, no complications reported  Goal of Therapy:  INR 2-3    Plan:   Warfarin 6mg  today at 18000  Daily INR   Arien Benincasa, Loma Messing PharmD 8:37 AM 05/01/2012

## 2012-05-01 NOTE — Progress Notes (Signed)
Subjective:  Patient reports  decrease abdominal pain. She denies chest pains or shortness of breath. She's had no further significant bradycardia. Cardiology followup appreciated. Patient notes occasional constipation at this time. No hematochezia.   Allergies  Allergen Reactions  . Daptomycin Other (See Comments)    Elevated CPK  . Morphine And Related Nausea And Vomiting  . Cephalexin Rash    unknown  . Celecoxib     unknown  . Codeine     unknown  . Fluoxetine Hcl     unknown  . Latex     REACTION: undefined  . Ofloxacin     unknown  . Penicillins     unknown  . Rofecoxib     unknown  . Sulfonamide Derivatives     unknown   Current Facility-Administered Medications  Medication Dose Route Frequency Provider Last Rate Last Dose  . allopurinol (ZYLOPRIM) tablet 100 mg  100 mg Oral Daily Rhetta Mura, MD   100 mg at 05/01/12 0934  . amiodarone (PACERONE) tablet 200 mg  200 mg Oral Daily Rhetta Mura, MD   200 mg at 05/01/12 0934  . aspirin EC tablet 81 mg  81 mg Oral Daily Rhetta Mura, MD   81 mg at 05/01/12 0934  . atorvastatin (LIPITOR) tablet 20 mg  20 mg Oral q1800 Gwenyth Bender, MD   20 mg at 05/01/12 1835  . clobetasol ointment (TEMOVATE) 0.05 % 1 application  1 application Topical Daily Rhetta Mura, MD   1 application at 05/01/12 1200  . digoxin (LANOXIN) tablet 125 mcg  125 mcg Oral Daily Rhetta Mura, MD   125 mcg at 05/01/12 0934  . fluticasone (FLOVENT HFA) 220 MCG/ACT inhaler 2 puff  2 puff Inhalation BID Gwenyth Bender, MD   2 puff at 05/01/12 2108  . furosemide (LASIX) tablet 160 mg  160 mg Oral BID Rhetta Mura, MD   160 mg at 05/01/12 1835  . insulin aspart (novoLOG) injection 0-9 Units  0-9 Units Subcutaneous TID WC Gwenyth Bender, MD   2 Units at 05/01/12 1835  . insulin glargine (LANTUS) injection 10 Units  10 Units Subcutaneous QHS Rhetta Mura, MD   10 Units at 05/01/12 2152  . Ipratropium-Albuterol (COMBIVENT)  respimat 1 puff  1 puff Inhalation TID Gwenyth Bender, MD   1 puff at 05/01/12 2107  . levofloxacin (LEVAQUIN) tablet 250 mg  250 mg Oral QHS Judyann Munson, MD   250 mg at 05/01/12 2155  . oxyCODONE (Oxy IR/ROXICODONE) immediate release tablet 5 mg  5 mg Oral Q4H PRN Rhetta Mura, MD   5 mg at 04/29/12 2139  . pantoprazole (PROTONIX) EC tablet 40 mg  40 mg Oral Daily Rhetta Mura, MD   40 mg at 05/01/12 0934  . senna (SENOKOT) tablet 8.6 mg  1 tablet Oral BID Rhetta Mura, MD   8.6 mg at 05/01/12 2152  . spironolactone (ALDACTONE) tablet 25 mg  25 mg Oral Daily Rhetta Mura, MD   25 mg at 05/01/12 0934  . warfarin (COUMADIN) tablet 6 mg  6 mg Oral ONCE-1800 Loma Messing [place], PHARMD   6 mg at 05/01/12 1835  . Warfarin - Pharmacist Dosing Inpatient   Does not apply q1800 Rhetta Mura, MD      . zolpidem (AMBIEN) tablet 5 mg  5 mg Oral QHS PRN Rhetta Mura, MD   5 mg at 05/01/12 2152  . DISCONTD: insulin aspart (novoLOG) injection 0-9 Units  0-9 Units Subcutaneous TID WC  Gwenyth Bender, MD      . DISCONTD: insulin aspart (novoLOG) injection 4-10 Units  4-10 Units Subcutaneous TID WC Gwenyth Bender, MD        Objective: Blood pressure 198/65, pulse 59, temperature 97.5 F (36.4 C), temperature source Oral, resp. rate 19, height 5\' 3"  (1.6 m), weight 259 lb 11.2 oz (117.8 kg), SpO2 93.00%.  Well-developed obese black female in no acute distress. HEENT:no sinus tenderness. NECK:no posterior cervical nodes. LUNGS:clear to auscultation. No wheezes appreciated. AV:WUJWJX S1, S2 without S3. BJY:NWGNF with decreased tenderness and lower abdominal region. Skin folds gradually healing per report. AOZ:HYQMVHQI Homans. NEURO:nonfocal.  Lab results: Results for orders placed during the hospital encounter of 04/27/12 (from the past 48 hour(s))  PROTIME-INR     Status: Abnormal   Collection Time   04/30/12  6:15 AM      Component Value Range Comment    Prothrombin Time 20.8 (*) 11.6 - 15.2 (seconds)    INR 1.76 (*) 0.00 - 1.49    DIGOXIN LEVEL     Status: Normal   Collection Time   04/30/12  6:15 AM      Component Value Range Comment   Digoxin Level 0.8  0.8 - 2.0 (ng/mL)   CBC     Status: Abnormal   Collection Time   04/30/12  6:15 AM      Component Value Range Comment   WBC 8.7  4.0 - 10.5 (K/uL)    RBC 3.88  3.87 - 5.11 (MIL/uL)    Hemoglobin 10.4 (*) 12.0 - 15.0 (g/dL)    HCT 69.6 (*) 29.5 - 46.0 (%)    MCV 87.1  78.0 - 100.0 (fL)    MCH 26.8  26.0 - 34.0 (pg)    MCHC 30.8  30.0 - 36.0 (g/dL)    RDW 28.4 (*) 13.2 - 15.5 (%)    Platelets 340  150 - 400 (K/uL)   DIFFERENTIAL     Status: Abnormal   Collection Time   04/30/12  6:15 AM      Component Value Range Comment   Neutrophils Relative 78 (*) 43 - 77 (%)    Lymphocytes Relative 9 (*) 12 - 46 (%)    Monocytes Relative 9  3 - 12 (%)    Eosinophils Relative 3  0 - 5 (%)    Basophils Relative 1  0 - 1 (%)    Neutro Abs 6.7  1.7 - 7.7 (K/uL)    Lymphs Abs 0.8  0.7 - 4.0 (K/uL)    Monocytes Absolute 0.8  0.1 - 1.0 (K/uL)    Eosinophils Absolute 0.3  0.0 - 0.7 (K/uL)    Basophils Absolute 0.1  0.0 - 0.1 (K/uL)    WBC Morphology MILD LEFT SHIFT (1-5% METAS, OCC MYELO, OCC BANDS)     COMPREHENSIVE METABOLIC PANEL     Status: Abnormal   Collection Time   04/30/12  6:15 AM      Component Value Range Comment   Sodium 137  135 - 145 (mEq/L)    Potassium 4.1  3.5 - 5.1 (mEq/L)    Chloride 97  96 - 112 (mEq/L)    CO2 30  19 - 32 (mEq/L)    Glucose, Bld 151 (*) 70 - 99 (mg/dL)    BUN 45 (*) 6 - 23 (mg/dL)    Creatinine, Ser 4.40 (*) 0.50 - 1.10 (mg/dL)    Calcium 9.2  8.4 - 10.5 (mg/dL)    Total Protein  7.7  6.0 - 8.3 (g/dL)    Albumin 3.0 (*) 3.5 - 5.2 (g/dL)    AST 20  0 - 37 (U/L)    ALT 17  0 - 35 (U/L)    Alkaline Phosphatase 131 (*) 39 - 117 (U/L)    Total Bilirubin 0.1 (*) 0.3 - 1.2 (mg/dL)    GFR calc non Af Amer 19 (*) >90 (mL/min)    GFR calc Af Amer 22 (*) >90  (mL/min)   PROTIME-INR     Status: Abnormal   Collection Time   05/01/12  4:50 AM      Component Value Range Comment   Prothrombin Time 22.6 (*) 11.6 - 15.2 (seconds)    INR 1.95 (*) 0.00 - 1.49    BASIC METABOLIC PANEL     Status: Abnormal   Collection Time   05/01/12  4:50 AM      Component Value Range Comment   Sodium 139  135 - 145 (mEq/L)    Potassium 3.9  3.5 - 5.1 (mEq/L)    Chloride 97  96 - 112 (mEq/L)    CO2 28  19 - 32 (mEq/L)    Glucose, Bld 144 (*) 70 - 99 (mg/dL)    BUN 51 (*) 6 - 23 (mg/dL)    Creatinine, Ser 1.61 (*) 0.50 - 1.10 (mg/dL)    Calcium 9.3  8.4 - 10.5 (mg/dL)    GFR calc non Af Amer 17 (*) >90 (mL/min)    GFR calc Af Amer 20 (*) >90 (mL/min)   CBC     Status: Abnormal   Collection Time   05/01/12  4:50 AM      Component Value Range Comment   WBC 9.8  4.0 - 10.5 (K/uL)    RBC 4.02  3.87 - 5.11 (MIL/uL)    Hemoglobin 11.2 (*) 12.0 - 15.0 (g/dL)    HCT 09.6 (*) 04.5 - 46.0 (%)    MCV 88.3  78.0 - 100.0 (fL)    MCH 27.9  26.0 - 34.0 (pg)    MCHC 31.5  30.0 - 36.0 (g/dL)    RDW 40.9 (*) 81.1 - 15.5 (%)    Platelets 359  150 - 400 (K/uL)   TSH     Status: Abnormal   Collection Time   05/01/12  4:50 AM      Component Value Range Comment   TSH 4.835 (*) 0.350 - 4.500 (uIU/mL)   GLUCOSE, CAPILLARY     Status: Abnormal   Collection Time   05/01/12  5:06 PM      Component Value Range Comment   Glucose-Capillary 199 (*) 70 - 99 (mg/dL)    Comment 1 Notify RN       Studies/Results: No results found.  Patient Active Problem List  Diagnoses  . HYPERLIPIDEMIA  . OBSTRUCTIVE SLEEP APNEA  . HYPERTENSION  . ATRIAL FIBRILLATION WITH RAPID VENTRICULAR RESPONSE  . Acute combined systolic and diastolic heart failure  . 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 BRONCHITIS, RECURRENT  . ASTHMA  . Obesity hypoventilation syndrome  . Prosthetic joint infection  . Group B streptococcal infection  . Gout  . Yeast infection involving the vagina and surrounding area  . SBO (small  bowel obstruction)  . DM (diabetes mellitus)  . Chronic anticoagulation  . Oral candidiasis  . Laceration of leg  . Chronic venous insufficiency    Impression: Skin exfoliation secondary to adverse drug reaction. Proteus species superinfection. Transient bradycardia improved with medication  adjustment. Obstructive sleep apnea with hypoventilation syndrome. History of atrial fibrillation with rapid ventricular response. Presently controlled medication. Chronic venous insufficiency. History of prosthetic joint infection. Mild diabetes mellitus. Morbid obesity.   Plan: Continue conservative wound care measures Continuing antibiotic therapy per ID recommendations. Followup in a.m. Patient presently unable to manage her wounds in her home setting.she has been adverse to short-term skilled nursing facility placement in the past.   [name], [name] 05/01/2012 10:26 PM

## 2012-05-02 LAB — GLUCOSE, CAPILLARY
Glucose-Capillary: 186 mg/dL — ABNORMAL HIGH (ref 70–99)
Glucose-Capillary: 170 mg/dL — ABNORMAL HIGH (ref 70–99)
Glucose-Capillary: 190 mg/dL — ABNORMAL HIGH (ref 70–99)

## 2012-05-02 LAB — WOUND CULTURE: Special Requests: NORMAL

## 2012-05-02 MED ORDER — WARFARIN SODIUM 5 MG PO TABS
5.0000 mg | ORAL_TABLET | Freq: Once | ORAL | Status: AC
  Administered 2012-05-02: 5 mg via ORAL
  Filled 2012-05-02: qty 1

## 2012-05-02 NOTE — Progress Notes (Signed)
Subjective:  Patient reports her abdominal area feels better overall. There still malodorous he he drainage noted a dressing changes. Patient denies chest pains or shortness of breath. No recent bowel movement. Patient is gradually adjusting to a healthier diet. She acknowledges ongoing dietary indiscretions at home.   Allergies  Allergen Reactions  . Daptomycin Other (See Comments)    Elevated CPK  . Morphine And Related Nausea And Vomiting  . Cephalexin Rash    unknown  . Celecoxib     unknown  . Codeine     unknown  . Fluoxetine Hcl     unknown  . Latex     REACTION: undefined  . Ofloxacin     unknown  . Penicillins     unknown  . Rofecoxib     unknown  . Sulfonamide Derivatives     unknown   Current Facility-Administered Medications  Medication Dose Route Frequency Provider Last Rate Last Dose  . allopurinol (ZYLOPRIM) tablet 100 mg  100 mg Oral Daily Rhetta Mura, MD   100 mg at 05/02/12 0913  . amiodarone (PACERONE) tablet 200 mg  200 mg Oral Daily Rhetta Mura, MD   200 mg at 05/02/12 0912  . aspirin EC tablet 81 mg  81 mg Oral Daily Rhetta Mura, MD   81 mg at 05/02/12 0913  . atorvastatin (LIPITOR) tablet 20 mg  20 mg Oral q1800 Gwenyth Bender, MD   20 mg at 05/02/12 1721  . clobetasol ointment (TEMOVATE) 0.05 % 1 application  1 application Topical Daily Rhetta Mura, MD   1 application at 05/02/12 0912  . digoxin (LANOXIN) tablet 125 mcg  125 mcg Oral Daily Rhetta Mura, MD   125 mcg at 05/02/12 0912  . fluticasone (FLOVENT HFA) 220 MCG/ACT inhaler 2 puff  2 puff Inhalation BID Gwenyth Bender, MD   2 puff at 05/02/12 [phone]  . furosemide (LASIX) tablet 160 mg  160 mg Oral BID Rhetta Mura, MD   160 mg at 05/02/12 1723  . insulin aspart (novoLOG) injection 0-9 Units  0-9 Units Subcutaneous TID WC Gwenyth Bender, MD   2 Units at 05/02/12 1719  . insulin glargine (LANTUS) injection 10 Units  10 Units Subcutaneous QHS Rhetta Mura,  MD   10 Units at 05/01/12 2152  . Ipratropium-Albuterol (COMBIVENT) respimat 1 puff  1 puff Inhalation TID Gwenyth Bender, MD   1 puff at 05/02/12 1431  . levofloxacin (LEVAQUIN) tablet 250 mg  250 mg Oral QHS Judyann Munson, MD   250 mg at 05/01/12 2155  . oxyCODONE (Oxy IR/ROXICODONE) immediate release tablet 5 mg  5 mg Oral Q4H PRN Rhetta Mura, MD   5 mg at 04/29/12 2139  . pantoprazole (PROTONIX) EC tablet 40 mg  40 mg Oral Daily Rhetta Mura, MD   40 mg at 05/02/12 0913  . senna (SENOKOT) tablet 8.6 mg  1 tablet Oral BID Rhetta Mura, MD   8.6 mg at 05/02/12 1139  . spironolactone (ALDACTONE) tablet 25 mg  25 mg Oral Daily Rhetta Mura, MD   25 mg at 05/02/12 0912  . warfarin (COUMADIN) tablet 5 mg  5 mg Oral ONCE-1800 Maryanna Shape [place], PHARMD   5 mg at 05/02/12 1721  . warfarin (COUMADIN) tablet 6 mg  6 mg Oral ONCE-1800 Loma Messing [place], PHARMD   6 mg at 05/01/12 1835  . Warfarin - Pharmacist Dosing Inpatient   Does not apply [id number] Rhetta Mura, MD      .  zolpidem (AMBIEN) tablet 5 mg  5 mg Oral QHS PRN Rhetta Mura, MD   5 mg at 05/01/12 2152    Objective: Blood pressure 163/46, pulse 59, temperature 97.5 F (36.4 C), temperature source Oral, resp. rate 18, height 5\' 3"  (1.6 m), weight 259 lb 11.2 oz (117.8 kg), SpO2 96.00%.  Well-developed obese black female in no acute distress sitting up in chair. HEENT: No sinus tenderness. NECK: No posterior cervical nodes. LUNGS: Clear to auscultation. No wheezes or rales. CV: Normal S1, S2 without S3. ABD: Bowel sounds are present. Wound site is dry dressings covered. MSK: Negative Homans no edema. NEURO: Nonfocal.  Lab results: Results for orders placed during the hospital encounter of 04/27/12 (from the past 48 hour(s))  PROTIME-INR     Status: Abnormal   Collection Time   05/01/12  4:50 AM      Component Value Range Comment   Prothrombin Time 22.6 (*) 11.6 - 15.2 (seconds)    INR 1.95  (*) 0.00 - 1.49    BASIC METABOLIC PANEL     Status: Abnormal   Collection Time   05/01/12  4:50 AM      Component Value Range Comment   Sodium 139  135 - 145 (mEq/L)    Potassium 3.9  3.5 - 5.1 (mEq/L)    Chloride 97  96 - 112 (mEq/L)    CO2 28  19 - 32 (mEq/L)    Glucose, Bld 144 (*) 70 - 99 (mg/dL)    BUN 51 (*) 6 - 23 (mg/dL)    Creatinine, Ser 4.09 (*) 0.50 - 1.10 (mg/dL)    Calcium 9.3  8.4 - 10.5 (mg/dL)    GFR calc non Af Amer 17 (*) >90 (mL/min)    GFR calc Af Amer 20 (*) >90 (mL/min)   CBC     Status: Abnormal   Collection Time   05/01/12  4:50 AM      Component Value Range Comment   WBC 9.8  4.0 - 10.5 (K/uL)    RBC 4.02  3.87 - 5.11 (MIL/uL)    Hemoglobin 11.2 (*) 12.0 - 15.0 (g/dL)    HCT 81.1 (*) 91.4 - 46.0 (%)    MCV 88.3  78.0 - 100.0 (fL)    MCH 27.9  26.0 - 34.0 (pg)    MCHC 31.5  30.0 - 36.0 (g/dL)    RDW 78.2 (*) 95.6 - 15.5 (%)    Platelets 359  150 - 400 (K/uL)   TSH     Status: Abnormal   Collection Time   05/01/12  4:50 AM      Component Value Range Comment   TSH 4.835 (*) 0.350 - 4.500 (uIU/mL)   GLUCOSE, CAPILLARY     Status: Abnormal   Collection Time   05/01/12  5:06 PM      Component Value Range Comment   Glucose-Capillary 199 (*) 70 - 99 (mg/dL)    Comment 1 Notify RN     GLUCOSE, CAPILLARY     Status: Abnormal   Collection Time   05/01/12 10:54 PM      Component Value Range Comment   Glucose-Capillary 212 (*) 70 - 99 (mg/dL)    Comment 1 Documented in Chart      Comment 2 Notify RN     PROTIME-INR     Status: Abnormal   Collection Time   05/02/12  6:31 AM      Component Value Range Comment   Prothrombin Time 24.6 (*)  11.6 - 15.2 (seconds)    INR 2.18 (*) 0.00 - 1.49    GLUCOSE, CAPILLARY     Status: Abnormal   Collection Time   05/02/12  7:33 AM      Component Value Range Comment   Glucose-Capillary 186 (*) 70 - 99 (mg/dL)    Comment 1 Notify RN      Comment 2 Documented in Chart     GLUCOSE, CAPILLARY     Status: Abnormal    Collection Time   05/02/12 12:05 PM      Component Value Range Comment   Glucose-Capillary 170 (*) 70 - 99 (mg/dL)    Comment 1 Notify RN      Comment 2 Documented in Chart     GLUCOSE, CAPILLARY     Status: Abnormal   Collection Time   05/02/12  5:01 PM      Component Value Range Comment   Glucose-Capillary 190 (*) 70 - 99 (mg/dL)     Studies/Results: No results found.  Patient Active Problem List  Diagnoses  . HYPERLIPIDEMIA  . OBSTRUCTIVE SLEEP APNEA  . HYPERTENSION  . ATRIAL FIBRILLATION WITH RAPID VENTRICULAR RESPONSE  . Acute combined systolic and diastolic heart failure  . 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 BRONCHITIS, RECURRENT  . ASTHMA  . Obesity hypoventilation syndrome  . Prosthetic joint infection  . Group B streptococcal infection  . Gout  . Yeast infection involving the vagina and surrounding area  . SBO (small bowel obstruction)  . DM (diabetes mellitus)  . Chronic anticoagulation  . Oral candidiasis  . Laceration of leg  . Chronic venous insufficiency    Impression: Exfoliative rash with superinfection. Wound cultures growing Proteus species. Bradycardia secondary to adverse medication effect. History atrial fibrillation with rapid ventricular response. Controlled with amiodarone. Diabetes mellitus. Chronic venous insufficiency. History of CVA. Chronic anticoagulation. Coumadin being managed by pharmacy. Morbid obesity with sleep apnea. Status post MRSA infection left knee. Chronic kidney disease stage III   Plan: Continue present therapy. Home on wound is manageable at home with outpatient therapy. The patient has no reliable medical help at home. Incentive spirometry. Continue antibiotic therapy her left knee prosthetic wound infection.   [name], Ahmeer Tuman 05/02/2012 6:31 PM

## 2012-05-02 NOTE — Progress Notes (Signed)
I have just changed the dressings at lower abd./proximal bilat. Thighs.  This area is excoriated in appearance with some scant. Serosanguinous drng. Which is quite malodorous, although the wound themselves are rather clean in appearance and are without any depth whatsoever.

## 2012-05-02 NOTE — Progress Notes (Signed)
ANTICOAGULATION CONSULT NOTE - Follow up Consult  Pharmacy Consult for warfarin Indication: atrial fibrillation  Allergies  Allergen Reactions  . Daptomycin Other (See Comments)    Elevated CPK  . Morphine And Related Nausea And Vomiting  . Cephalexin Rash    unknown  . Celecoxib     unknown  . Codeine     unknown  . Fluoxetine Hcl     unknown  . Latex     REACTION: undefined  . Ofloxacin     unknown  . Penicillins     unknown  . Rofecoxib     unknown  . Sulfonamide Derivatives     unknown    Patient Measurements: Height: 5\' 3"  (160 cm) Weight: 259 lb 11.2 oz (117.8 kg) IBW/kg (Calculated) : 52.4   Vital Signs: Temp: 98.3 F (36.8 C) (05/25 0612) Temp src: Oral (05/25 0612) BP: 189/71 mmHg (05/25 0612) Pulse Rate: 62  (05/25 0612)  Labs:  Basename 05/02/12 0631 05/01/12 0450 04/30/12 0615  HGB -- 11.2* 10.4*  HCT -- 35.5* 33.8*  PLT -- 359 340  APTT -- -- --  LABPROT 24.6* 22.6* 20.8*  INR 2.18* 1.95* 1.76*  HEPARINUNFRC -- -- --  CREATININE -- 2.74* 2.60*  CKTOTAL -- -- --  CKMB -- -- --  TROPONINI -- -- --    Estimated Creatinine Clearance: 26.4 ml/min (by C-G formula based on Cr of 2.74).   Medications:     . allopurinol  100 mg Oral Daily  . amiodarone  200 mg Oral Daily  . aspirin EC  81 mg Oral Daily  . atorvastatin  20 mg Oral q1800  . clobetasol ointment  1 application Topical Daily  . digoxin  125 mcg Oral Daily  . fluticasone  2 puff Inhalation BID  . furosemide  160 mg Oral BID  . insulin aspart  0-9 Units Subcutaneous TID WC  . insulin glargine  10 Units Subcutaneous QHS  . Ipratropium-Albuterol  1 puff Inhalation TID  . levofloxacin  250 mg Oral QHS  . pantoprazole  40 mg Oral Daily  . senna  1 tablet Oral BID  . spironolactone  25 mg Oral Daily  . warfarin  6 mg Oral ONCE-1800  . Warfarin - Pharmacist Dosing Inpatient   Does not apply q1800  . DISCONTD: insulin aspart  4-10 Units Subcutaneous TID WC      Assessment:  61 yo F on chronic coumadin PTA for A.Fib  Home dose = 6 mg daily    INR therapeutic on home dose of 6 mg daily  Drug Interaction: Levaquin.  Can increase INR, so will reduce home coumadin dose slightly in anticipation of INR increase.  CBC stable, no complications reported  Goal of Therapy:  INR 2-3  Plan:   Warfarin 5 mg today at 1800  Daily INR   [name] PharmD 9:19 AM 05/02/2012

## 2012-05-03 LAB — GLUCOSE, CAPILLARY
Glucose-Capillary: 270 mg/dL — ABNORMAL HIGH (ref 70–99)
Glucose-Capillary: 156 mg/dL — ABNORMAL HIGH (ref 70–99)

## 2012-05-03 LAB — PROTIME-INR
INR: 2.59 — ABNORMAL HIGH (ref 0.00–1.49)
Prothrombin Time: 28.2 seconds — ABNORMAL HIGH (ref 11.6–15.2)

## 2012-05-03 MED ORDER — WARFARIN SODIUM 2 MG PO TABS
2.0000 mg | ORAL_TABLET | Freq: Once | ORAL | Status: AC
  Administered 2012-05-03: 2 mg via ORAL
  Filled 2012-05-03: qty 1

## 2012-05-03 NOTE — Progress Notes (Signed)
ANTICOAGULATION CONSULT NOTE - Follow up Consult  Pharmacy Consult for warfarin Indication: atrial fibrillation  Allergies  Allergen Reactions  . Daptomycin Other (See Comments)    Elevated CPK  . Morphine And Related Nausea And Vomiting  . Cephalexin Rash    unknown  . Celecoxib     unknown  . Codeine     unknown  . Fluoxetine Hcl     unknown  . Latex     REACTION: undefined  . Ofloxacin     unknown  . Penicillins     unknown  . Rofecoxib     unknown  . Sulfonamide Derivatives     unknown    Patient Measurements: Height: 5\' 3"  (160 cm) Weight: 259 lb 11.2 oz (117.8 kg) IBW/kg (Calculated) : 52.4   Vital Signs: Temp: 97.9 F (36.6 C) (05/26 0600) Temp src: Oral (05/26 0600) BP: 158/70 mmHg (05/26 0600) Pulse Rate: 56  (05/26 0600)  Labs:  [name] 05/03/12 2130 05/02/12 0631 05/01/12 0450  HGB -- -- 11.2*  HCT -- -- 35.5*  PLT -- -- 359  APTT -- -- --  LABPROT 28.2* 24.6* 22.6*  INR 2.59* 2.18* 1.95*  HEPARINUNFRC -- -- --  CREATININE -- -- 2.74*  CKTOTAL -- -- --  CKMB -- -- --  TROPONINI -- -- --    Estimated Creatinine Clearance: 26.4 ml/min (by C-G formula based on Cr of 2.74).   Medications:     . allopurinol  100 mg Oral Daily  . amiodarone  200 mg Oral Daily  . aspirin EC  81 mg Oral Daily  . atorvastatin  20 mg Oral q1800  . clobetasol ointment  1 application Topical Daily  . digoxin  125 mcg Oral Daily  . fluticasone  2 puff Inhalation BID  . furosemide  160 mg Oral BID  . insulin aspart  0-9 Units Subcutaneous TID WC  . insulin glargine  10 Units Subcutaneous QHS  . Ipratropium-Albuterol  1 puff Inhalation TID  . levofloxacin  250 mg Oral QHS  . pantoprazole  40 mg Oral Daily  . senna  1 tablet Oral BID  . spironolactone  25 mg Oral Daily  . warfarin  5 mg Oral ONCE-1800  . Warfarin - Pharmacist Dosing Inpatient   Does not apply q1800     Assessment:  61 yo F on chronic coumadin PTA for A.Fib  Home dose = 6 mg daily      Drug Interaction: Levaquin.  May cause increase INR.  INR increased significantly overnight and this may be a result of the Levaquin so will use a reduced dose today.  INR currently therapeutic.  CBC stable, no complications reported  Goal of Therapy:  INR 2-3  Plan:   Warfarin 2 mg today at 1800  Daily INR   [name] PharmD 8:38 AM 05/03/2012

## 2012-05-04 LAB — GLUCOSE, CAPILLARY
Glucose-Capillary: 186 mg/dL — ABNORMAL HIGH (ref 70–99)
Glucose-Capillary: 234 mg/dL — ABNORMAL HIGH (ref 70–99)
Glucose-Capillary: 150 mg/dL — ABNORMAL HIGH (ref 70–99)
Glucose-Capillary: 230 mg/dL — ABNORMAL HIGH (ref 70–99)

## 2012-05-04 LAB — PROTIME-INR: INR: 2.65 — ABNORMAL HIGH (ref 0.00–1.49)

## 2012-05-04 MED ORDER — INSULIN GLARGINE 100 UNIT/ML ~~LOC~~ SOLN
12.0000 [IU] | Freq: Every day | SUBCUTANEOUS | Status: DC
  Administered 2012-05-04 – 2012-05-05 (×2): 12 [IU] via SUBCUTANEOUS

## 2012-05-04 MED ORDER — WARFARIN SODIUM 2 MG PO TABS
2.0000 mg | ORAL_TABLET | Freq: Once | ORAL | Status: AC
  Administered 2012-05-04: 2 mg via ORAL
  Filled 2012-05-04: qty 1

## 2012-05-04 NOTE — Progress Notes (Signed)
Spoke to cardiology Dr. Sharyn Lull regarding patient's bradycardia.  Informed Dr. Sharyn Lull that patient has not been receiving her digoxin, or amiodorone due to consistent low heart rate.  As per Dr. Sharyn Lull will adjust medication dosage.  Patient's heart rate 50 today, digoxin and amiodorone held.  As per Dr. Sharyn Lull, if heart rate is above 55, may give medications.

## 2012-05-04 NOTE — Progress Notes (Signed)
Subjective:  Patient feeling better today. Wounds are reportedly improved. Drainage is not as severe as well as older. The patient not had any further significant bradycardia. She's been cleared by cardiology at this time as well. Will need to be sure patient can change her dressings at home without supportive care.   Allergies  Allergen Reactions  . Daptomycin Other (See Comments)    Elevated CPK  . Morphine And Related Nausea And Vomiting  . Cephalexin Rash    unknown  . Celecoxib     unknown  . Codeine     unknown  . Fluoxetine Hcl     unknown  . Latex     REACTION: undefined  . Ofloxacin     unknown  . Penicillins     unknown  . Rofecoxib     unknown  . Sulfonamide Derivatives     unknown   Current Facility-Administered Medications  Medication Dose Route Frequency Provider Last Rate Last Dose  . allopurinol (ZYLOPRIM) tablet 100 mg  100 mg Oral Daily Rhetta Mura, MD   100 mg at 05/04/12 0942  . amiodarone (PACERONE) tablet 200 mg  200 mg Oral Daily Rhetta Mura, MD   200 mg at 05/02/12 0912  . aspirin EC tablet 81 mg  81 mg Oral Daily Rhetta Mura, MD   81 mg at 05/04/12 0942  . atorvastatin (LIPITOR) tablet 20 mg  20 mg Oral q1800 Gwenyth Bender, MD   20 mg at 05/04/12 1733  . clobetasol ointment (TEMOVATE) 0.05 % 1 application  1 application Topical Daily Rhetta Mura, MD   1 application at 05/04/12 1000  . digoxin (LANOXIN) tablet 125 mcg  125 mcg Oral Daily Rhetta Mura, MD   125 mcg at 05/02/12 0912  . fluticasone (FLOVENT HFA) 220 MCG/ACT inhaler 2 puff  2 puff Inhalation BID Gwenyth Bender, MD   2 puff at 05/04/12 0803  . furosemide (LASIX) tablet 160 mg  160 mg Oral BID Rhetta Mura, MD   160 mg at 05/04/12 1734  . insulin aspart (novoLOG) injection 0-9 Units  0-9 Units Subcutaneous TID WC Gwenyth Bender, MD   1 Units at 05/04/12 1735  . insulin glargine (LANTUS) injection 10 Units  10 Units Subcutaneous QHS Rhetta Mura, MD    10 Units at 05/03/12 2131  . Ipratropium-Albuterol (COMBIVENT) respimat 1 puff  1 puff Inhalation TID Gwenyth Bender, MD   1 puff at 05/04/12 1510  . levofloxacin (LEVAQUIN) tablet 250 mg  250 mg Oral QHS Judyann Munson, MD   250 mg at 05/03/12 2130  . oxyCODONE (Oxy IR/ROXICODONE) immediate release tablet 5 mg  5 mg Oral Q4H PRN Rhetta Mura, MD   5 mg at 04/29/12 2139  . pantoprazole (PROTONIX) EC tablet 40 mg  40 mg Oral Daily Rhetta Mura, MD   40 mg at 05/04/12 0942  . senna (SENOKOT) tablet 8.6 mg  1 tablet Oral BID Rhetta Mura, MD   8.6 mg at 05/03/12 2130  . spironolactone (ALDACTONE) tablet 25 mg  25 mg Oral Daily Rhetta Mura, MD   25 mg at 05/04/12 0942  . warfarin (COUMADIN) tablet 2 mg  2 mg Oral ONCE-1800 Maryanna Shape Runyon, PHARMD   2 mg at 05/04/12 1733  . Warfarin - Pharmacist Dosing Inpatient   Does not apply q1800 Rhetta Mura, MD      . zolpidem (AMBIEN) tablet 5 mg  5 mg Oral QHS PRN Rhetta Mura, MD   5 mg  at 05/03/12 2309    Objective: Blood pressure 153/72, pulse 55, temperature 98.1 F (36.7 C), temperature source Oral, resp. rate 18, height 5\' 3"  (1.6 m), weight 259 lb 11.2 oz (117.8 kg), SpO2 94.00%.  Well-developed obese black female in no acute distress. HEENT: No sinus tenderness. NECK: No posterior cervical nodes. LUNGS: Clear to auscultation. No vocal fremitus. CV: Normal S1, S2 without S3. ABD: Obese. No tenderness to palpation in the lower abdomen region. This hasn't been changed. No increased or obvious. MSK: Negative Homans. NEURO: Nonfocal.  Lab results: Results for orders placed during the hospital encounter of 04/27/12 (from the past 48 hour(s))  GLUCOSE, CAPILLARY     Status: Abnormal   Collection Time   05/02/12 10:34 PM      Component Value Range Comment   Glucose-Capillary 270 (*) 70 - 99 (mg/dL)   PROTIME-INR     Status: Abnormal   Collection Time   05/03/12  6:33 AM      Component Value Range Comment     Prothrombin Time 28.2 (*) 11.6 - 15.2 (seconds)    INR 2.59 (*) 0.00 - 1.49    GLUCOSE, CAPILLARY     Status: Abnormal   Collection Time   05/03/12  8:07 AM      Component Value Range Comment   Glucose-Capillary 156 (*) 70 - 99 (mg/dL)   GLUCOSE, CAPILLARY     Status: Abnormal   Collection Time   05/03/12 11:44 AM      Component Value Range Comment   Glucose-Capillary 225 (*) 70 - 99 (mg/dL)   GLUCOSE, CAPILLARY     Status: Abnormal   Collection Time   05/03/12  5:31 PM      Component Value Range Comment   Glucose-Capillary 184 (*) 70 - 99 (mg/dL)   GLUCOSE, CAPILLARY     Status: Abnormal   Collection Time   05/03/12 10:24 PM      Component Value Range Comment   Glucose-Capillary 226 (*) 70 - 99 (mg/dL)   PROTIME-INR     Status: Abnormal   Collection Time   05/04/12  4:58 AM      Component Value Range Comment   Prothrombin Time 28.7 (*) 11.6 - 15.2 (seconds)    INR 2.65 (*) 0.00 - 1.49    GLUCOSE, CAPILLARY     Status: Abnormal   Collection Time   05/04/12  7:48 AM      Component Value Range Comment   Glucose-Capillary 186 (*) 70 - 99 (mg/dL)    Comment 1 Notify RN     GLUCOSE, CAPILLARY     Status: Abnormal   Collection Time   05/04/12 11:41 AM      Component Value Range Comment   Glucose-Capillary 234 (*) 70 - 99 (mg/dL)    Comment 1 Notify RN     GLUCOSE, CAPILLARY     Status: Abnormal   Collection Time   05/04/12  5:21 PM      Component Value Range Comment   Glucose-Capillary 150 (*) 70 - 99 (mg/dL)     Studies/Results: No results found.  Patient Active Problem List  Diagnoses  . HYPERLIPIDEMIA  . OBSTRUCTIVE SLEEP APNEA  . HYPERTENSION  . ATRIAL FIBRILLATION WITH RAPID VENTRICULAR RESPONSE  . Acute combined systolic and diastolic heart failure  . 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 BRONCHITIS, RECURRENT  . ASTHMA  . Obesity hypoventilation syndrome  . Prosthetic joint infection  . Group B streptococcal infection  .  Gout  . Yeast infection involving the vagina and  surrounding area  . SBO (small bowel obstruction)  . DM (diabetes mellitus)  . Chronic anticoagulation  . Oral candidiasis  . Laceration of leg  . 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 Secondary Proteus wound infection. Diabetes mellitus improved. Morbid obesity. Sleep apnea with hypoventilation syndrome. Atrial fibrillation with variable rate control. Recent bradycardia which has been corrected with medication adjustment.     Plan: Continue present therapy with cardiac medications adjusted downward. Confirm home health care for wound care and dressing changes. Adjust at bedtime Lantus to 12 units subcutaneous each bedtime Discharge home within 24-48 hours. Patient will need supplies for changing dressings.   [name], Kweku Stankey 05/04/2012 6:12 PM

## 2012-05-04 NOTE — Progress Notes (Signed)
ANTICOAGULATION CONSULT NOTE - Follow up Consult  Pharmacy Consult for warfarin Indication: atrial fibrillation  Allergies  Allergen Reactions  . Daptomycin Other (See Comments)    Elevated CPK  . Morphine And Related Nausea And Vomiting  . Cephalexin Rash    unknown  . Celecoxib     unknown  . Codeine     unknown  . Fluoxetine Hcl     unknown  . Latex     REACTION: undefined  . Ofloxacin     unknown  . Penicillins     unknown  . Rofecoxib     unknown  . Sulfonamide Derivatives     unknown    Patient Measurements: Height: 5\' 3"  (160 cm) Weight: 259 lb 11.2 oz (117.8 kg) IBW/kg (Calculated) : 52.4   Vital Signs: Temp: 97.6 F (36.4 C) (05/27 0610) Temp src: Oral (05/27 0610) BP: 162/65 mmHg (05/27 0610) Pulse Rate: 55  (05/27 0610)  Labs:  [name] 05/04/12 0458 05/03/12 2130 05/02/12 0631  HGB -- -- --  HCT -- -- --  PLT -- -- --  APTT -- -- --  LABPROT 28.7* 28.2* 24.6*  INR 2.65* 2.59* 2.18*  HEPARINUNFRC -- -- --  CREATININE -- -- --  CKTOTAL -- -- --  CKMB -- -- --  TROPONINI -- -- --    Estimated Creatinine Clearance: 26.4 ml/min (by C-G formula based on Cr of 2.74).   Medications:     . allopurinol  100 mg Oral Daily  . amiodarone  200 mg Oral Daily  . aspirin EC  81 mg Oral Daily  . atorvastatin  20 mg Oral q1800  . clobetasol ointment  1 application Topical Daily  . digoxin  125 mcg Oral Daily  . fluticasone  2 puff Inhalation BID  . furosemide  160 mg Oral BID  . insulin aspart  0-9 Units Subcutaneous TID WC  . insulin glargine  10 Units Subcutaneous QHS  . Ipratropium-Albuterol  1 puff Inhalation TID  . levofloxacin  250 mg Oral QHS  . pantoprazole  40 mg Oral Daily  . senna  1 tablet Oral BID  . spironolactone  25 mg Oral Daily  . warfarin  2 mg Oral ONCE-1800  . Warfarin - Pharmacist Dosing Inpatient   Does not apply q1800    Assessment:  61 yo F on chronic coumadin PTA for A.Fib  Home dose = 6 mg daily    Drug  Interaction: Levaquin causing increase in INR.  INR currently therapeutic, using reduced doses.  CBC previously stable (hasn't been drawn since 5/24, will order for AM), no complications reported.  Goal of Therapy:  INR 2-3  Plan:   Repeat warfarin 2 mg today at 1800  Daily INR   [name] PharmD 9:36 AM 05/04/2012

## 2012-05-04 NOTE — Progress Notes (Signed)
Subjective:  Patient denies any chest pain or shortness of breath. Patient had occasional episode of marked sinus bradycardia nonsustained asymptomatic amiodarone was held yesterday  Objective:  Vital Signs in the last 24 hours: Temp:  [97.3 F (36.3 C)-98.2 F (36.8 C)] 97.6 F (36.4 C) (05/27 0610) Pulse Rate:  [55-61] 55  (05/27 0610) Resp:  [18-20] 20  (05/27 0610) BP: (157-164)/(61-65) 162/65 mmHg (05/27 0610) SpO2:  [95 %-98 %] 98 % (05/27 0803)  Intake/Output from previous day: 05/26 0701 - 05/27 0700 In: 480 [P.O.:480] Out: 700 [Urine:700] Intake/Output from this shift: Total I/O In: 600 [P.O.:600] Out: 350 [Urine:350]  Physical Exam: Neck: no adenopathy, no carotid bruit, no JVD and supple, symmetrical, trachea midline Lungs: Decreased breath sound at bases Heart: regular rate and rhythm, S1, S2 normal, no murmur, click, rub or gallop Abdomen: soft, non-tender; bowel sounds normal; no masses,  no organomegaly Extremities: extremities normal, atraumatic, no cyanosis or edema  Lab Results: No results found for this basename: WBC:2,HGB:2,PLT:2 in the last 72 hours No results found for this basename: NA:2,K:2,CL:2,CO2:2,GLUCOSE:2,BUN:2,CREATININE:2 in the last 72 hours No results found for this basename: TROPONINI:2,CK,MB:2 in the last 72 hours Hepatic Function Panel No results found for this basename: PROT,ALBUMIN,AST,ALT,ALKPHOS,BILITOT,BILIDIR,IBILI in the last 72 hours No results found for this basename: CHOL in the last 72 hours No results found for this basename: PROTIME in the last 72 hours  Imaging: Imaging results have been reviewed and No results found.  Cardiac Studies:  Assessment/Plan:  Status post Marked sinus bradycardia secondary to medication  Mild decompensated systolic/diastolic heart failure secondary to volume overload improved  Mild coronary artery disease  History of A. 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

## 2012-05-04 NOTE — Progress Notes (Signed)
Dressings changed to bilateral upper thighs and abdominal folds.  Small amount of bloody drainage noted.  Wounds cleansed before dressing.  Wounds appear to be healing well.  Will continue to monitor.

## 2012-05-05 LAB — CBC
Hemoglobin: 10.3 g/dL — ABNORMAL LOW (ref 12.0–15.0)
MCH: 27.5 pg (ref 26.0–34.0)
MCHC: 30.7 g/dL (ref 30.0–36.0)
Platelets: 302 10*3/uL (ref 150–400)
RDW: 17.7 % — ABNORMAL HIGH (ref 11.5–15.5)

## 2012-05-05 LAB — GLUCOSE, CAPILLARY
Glucose-Capillary: 207 mg/dL — ABNORMAL HIGH (ref 70–99)
Glucose-Capillary: 273 mg/dL — ABNORMAL HIGH (ref 70–99)

## 2012-05-05 LAB — PROTIME-INR
INR: 2.52 — ABNORMAL HIGH (ref 0.00–1.49)
Prothrombin Time: 27.6 seconds — ABNORMAL HIGH (ref 11.6–15.2)

## 2012-05-05 MED ORDER — WARFARIN SODIUM 4 MG PO TABS
4.0000 mg | ORAL_TABLET | Freq: Once | ORAL | Status: AC
  Administered 2012-05-05: 4 mg via ORAL
  Filled 2012-05-05: qty 1

## 2012-05-05 NOTE — Progress Notes (Signed)
Subjective:  Patient feeling better overall. Wound care was reviewed with patient. Patient feels she can continued to change her dressings herself with supervision. She denies chest pains or shortness of breath. Abdominal pain and discomfort much improved. Wound continued to heal. No other new complaints. Husband will take patient home tomorrow morning.   Allergies  Allergen Reactions  . Daptomycin Other (See Comments)    Elevated CPK  . Morphine And Related Nausea And Vomiting  . Cephalexin Rash    unknown  . Celecoxib     unknown  . Codeine     unknown  . Fluoxetine Hcl     unknown  . Latex     REACTION: undefined  . Ofloxacin     unknown  . Penicillins     unknown  . Rofecoxib     unknown  . Sulfonamide Derivatives     unknown   Current Facility-Administered Medications  Medication Dose Route Frequency Provider Last Rate Last Dose  . allopurinol (ZYLOPRIM) tablet 100 mg  100 mg Oral Daily Rhetta Mura, MD   100 mg at 05/05/12 1122  . amiodarone (PACERONE) tablet 200 mg  200 mg Oral Daily Rhetta Mura, MD   200 mg at 05/02/12 0912  . aspirin EC tablet 81 mg  81 mg Oral Daily Rhetta Mura, MD   81 mg at 05/05/12 1119  . atorvastatin (LIPITOR) tablet 20 mg  20 mg Oral q1800 Gwenyth Bender, MD   20 mg at 05/05/12 1809  . clobetasol ointment (TEMOVATE) 0.05 % 1 application  1 application Topical Daily Rhetta Mura, MD   1 application at 05/04/12 1000  . digoxin (LANOXIN) tablet 125 mcg  125 mcg Oral Daily Rhetta Mura, MD   125 mcg at 05/02/12 0912  . fluticasone (FLOVENT HFA) 220 MCG/ACT inhaler 2 puff  2 puff Inhalation BID Gwenyth Bender, MD   2 puff at 05/05/12 2039  . furosemide (LASIX) tablet 160 mg  160 mg Oral BID Rhetta Mura, MD   160 mg at 05/05/12 1809  . insulin aspart (novoLOG) injection 0-9 Units  0-9 Units Subcutaneous TID WC Gwenyth Bender, MD   3 Units at 05/05/12 1757  . insulin glargine (LANTUS) injection 12 Units  12 Units  Subcutaneous QHS Gwenyth Bender, MD   12 Units at 05/05/12 2134  . Ipratropium-Albuterol (COMBIVENT) respimat 1 puff  1 puff Inhalation TID Gwenyth Bender, MD   1 puff at 05/05/12 2039  . levofloxacin (LEVAQUIN) tablet 250 mg  250 mg Oral QHS Judyann Munson, MD   250 mg at 05/05/12 2133  . oxyCODONE (Oxy IR/ROXICODONE) immediate release tablet 5 mg  5 mg Oral Q4H PRN Rhetta Mura, MD   5 mg at 04/29/12 2139  . pantoprazole (PROTONIX) EC tablet 40 mg  40 mg Oral Daily Rhetta Mura, MD   40 mg at 05/05/12 1121  . senna (SENOKOT) tablet 8.6 mg  1 tablet Oral BID Rhetta Mura, MD   8.6 mg at 05/04/12 2248  . spironolactone (ALDACTONE) tablet 25 mg  25 mg Oral Daily Rhetta Mura, MD   25 mg at 05/05/12 1119  . warfarin (COUMADIN) tablet 4 mg  4 mg Oral ONCE-1800 Randall K Absher, PHARMD   4 mg at 05/05/12 1809  . Warfarin - Pharmacist Dosing Inpatient   Does not apply q1800 Rhetta Mura, MD      . zolpidem (AMBIEN) tablet 5 mg  5 mg Oral QHS PRN Rhetta Mura, MD  5 mg at 05/05/12 2133    Objective: Blood pressure 159/59, pulse 60, temperature 98.1 F (36.7 C), temperature source Oral, resp. rate 19, height 5\' 3"  (1.6 m), weight 259 lb 11.2 oz (117.8 kg), SpO2 96.00%.  Well-developed obese black female in no acute distress. HEENT:no sinus tenderness. NECK:no posterior cervical nodes. LUNGS:clear to auscultation. No vocal fremitus. VO:ZDGUYQ S1, S2 without S3. IHK:VQQV. Decreased tenderness in lower abdomen. Wounds continue to heal. ZDG:LOVFIEPP Homans. NEURO:nonfocal.  Lab results: Results for orders placed during the hospital encounter of 04/27/12 (from the past 48 hour(s))  PROTIME-INR     Status: Abnormal   Collection Time   05/04/12  4:58 AM      Component Value Range Comment   Prothrombin Time 28.7 (*) 11.6 - 15.2 (seconds)    INR 2.65 (*) 0.00 - 1.49    GLUCOSE, CAPILLARY     Status: Abnormal   Collection Time   05/04/12  7:48 AM      Component  Value Range Comment   Glucose-Capillary 186 (*) 70 - 99 (mg/dL)    Comment 1 Notify RN     GLUCOSE, CAPILLARY     Status: Abnormal   Collection Time   05/04/12 11:41 AM      Component Value Range Comment   Glucose-Capillary 234 (*) 70 - 99 (mg/dL)    Comment 1 Notify RN     GLUCOSE, CAPILLARY     Status: Abnormal   Collection Time   05/04/12  5:21 PM      Component Value Range Comment   Glucose-Capillary 150 (*) 70 - 99 (mg/dL)   GLUCOSE, CAPILLARY     Status: Abnormal   Collection Time   05/04/12  9:19 PM      Component Value Range Comment   Glucose-Capillary 230 (*) 70 - 99 (mg/dL)    Comment 1 Notify RN     PROTIME-INR     Status: Abnormal   Collection Time   05/05/12  4:53 AM      Component Value Range Comment   Prothrombin Time 27.6 (*) 11.6 - 15.2 (seconds)    INR 2.52 (*) 0.00 - 1.49    CBC     Status: Abnormal   Collection Time   05/05/12  4:53 AM      Component Value Range Comment   WBC 10.5  4.0 - 10.5 (K/uL)    RBC 3.75 (*) 3.87 - 5.11 (MIL/uL)    Hemoglobin 10.3 (*) 12.0 - 15.0 (g/dL)    HCT 29.5 (*) 18.8 - 46.0 (%)    MCV 89.6  78.0 - 100.0 (fL)    MCH 27.5  26.0 - 34.0 (pg)    MCHC 30.7  30.0 - 36.0 (g/dL)    RDW 41.6 (*) 60.6 - 15.5 (%)    Platelets 302  150 - 400 (K/uL)   GLUCOSE, CAPILLARY     Status: Abnormal   Collection Time   05/05/12 11:52 AM      Component Value Range Comment   Glucose-Capillary 179 (*) 70 - 99 (mg/dL)    Comment 1 Notify RN     GLUCOSE, CAPILLARY     Status: Abnormal   Collection Time   05/05/12  4:54 PM      Component Value Range Comment   Glucose-Capillary 207 (*) 70 - 99 (mg/dL)    Comment 1 Notify RN     GLUCOSE, CAPILLARY     Status: Abnormal   Collection Time   05/05/12  9:36  PM      Component Value Range Comment   Glucose-Capillary 273 (*) 70 - 99 (mg/dL)    Comment 1 Notify RN       Studies/Results: No results found.  Patient Active Problem List  Diagnoses  . HYPERLIPIDEMIA  . OBSTRUCTIVE SLEEP APNEA  .  HYPERTENSION  . ATRIAL FIBRILLATION WITH RAPID VENTRICULAR RESPONSE  . Acute combined systolic and diastolic heart failure  . 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 BRONCHITIS, RECURRENT  . ASTHMA  . Obesity hypoventilation syndrome  . Prosthetic joint infection  . Group B streptococcal infection  . Gout  . Yeast infection involving the vagina and surrounding area  . SBO (small bowel obstruction)  . DM (diabetes mellitus)  . Chronic anticoagulation  . Oral candidiasis  . Laceration of leg  . Chronic venous insufficiency    Impression: Exfoliative rash with secondary infection. Improving. Diabetes mellitus. Mild increasing CBGs after downward trend. Patient fibrillation with rapid ventricular response. Transient bradycardia. Improved Asthmatic bronchitis. Stable Obstructive sleep apnea. Patient with improved compliance her hospitalization. Morbid obesity. Psoriasis.   Plan: Increase h.s. Lantus to 14 units. Discharge patient home tomorrow with further outpatient followup. Home health care to followup for wound healing.   [name], Elysha Daw 05/05/2012 11:20 PM

## 2012-05-05 NOTE — Progress Notes (Signed)
   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  Secondary Proteus wound infection.     Action/Plan:   lives with family-- ACTIVE WITH [facility]--   Anticipated DC Date:  05/05/2012   Anticipated DC Plan:  HOME W HOME HEALTH SERVICES  In-house referral  NA      DC Planning Services  CM consult      [facility] Choice  Resumption Of Svcs/PTA Provider   Choice offered to / List presented to:  C-1 Patient   DME arranged  NA      DME agency  NA     HH arranged  HH-1 RN  HH-2 PT  HH-3 OT      [facility] agency  [facility]   Status of service:  In process, will continue to follow Medicare Important Message given?   (If response is "NO", the following Medicare IM given date fields will be blank) Date Medicare IM given:   Date Additional Medicare IM given:    Discharge Disposition:  HOME W HOME HEALTH SERVICES  Per UR Regulation:  Reviewed for med. necessity/level of care/duration of stay  If discussed at Long Length of Stay Meetings, dates discussed:    Comments:  05-05-12 Raiford Noble, RN,BSN,CM [phone] Pt active with [facility]-- NEED ORDER FOR HHC RN FOR WOUND CARE PER PROTOCOL, also IF PT/INR needed please order HHC RN to draw. Active for PT/OT services with [facility]. [name] following.

## 2012-05-05 NOTE — Progress Notes (Signed)
Pt dressing was changed. Scant amount of drainage upon dressing. Wound is healing, and skin is looking better. Antifungal powder was scattered under skin folds. Patient demonstrated understanding of being able to do it at home.  Patient had concerns of how to get the silicone gel adhesive dressing covers.    [name], Nancy Leifheit L  05/05/2012 4:00 PM

## 2012-05-05 NOTE — Progress Notes (Signed)
ANTICOAGULATION CONSULT NOTE - Follow up Consult  Pharmacy Consult for warfarin Indication: atrial fibrillation  Allergies  Allergen Reactions  . Daptomycin Other (See Comments)    Elevated CPK  . Morphine And Related Nausea And Vomiting  . Cephalexin Rash    unknown  . Celecoxib     unknown  . Codeine     unknown  . Fluoxetine Hcl     unknown  . Latex     REACTION: undefined  . Ofloxacin     unknown  . Penicillins     unknown  . Rofecoxib     unknown  . Sulfonamide Derivatives     unknown    Patient Measurements: Height: 5\' 3"  (160 cm) Weight: 259 lb 11.2 oz (117.8 kg) IBW/kg (Calculated) : 52.4   Vital Signs: Temp: 97.9 F (36.6 C) (05/28 0538) Temp src: Oral (05/28 0538) BP: 139/46 mmHg (05/28 0538) Pulse Rate: 64  (05/28 0538)  Labs:  [name] 05/05/12 0453 05/04/12 0458 05/03/12 0633  HGB 10.3* -- --  HCT 33.6* -- --  PLT 302 -- --  APTT -- -- --  LABPROT 27.6* 28.7* 28.2*  INR 2.52* 2.65* 2.59*  HEPARINUNFRC -- -- --  CREATININE -- -- --  CKTOTAL -- -- --  CKMB -- -- --  TROPONINI -- -- --    Estimated Creatinine Clearance: 26.4 ml/min (by C-G formula based on Cr of 2.74).   Medications:     . allopurinol  100 mg Oral Daily  . amiodarone  200 mg Oral Daily  . aspirin EC  81 mg Oral Daily  . atorvastatin  20 mg Oral q1800  . clobetasol ointment  1 application Topical Daily  . digoxin  125 mcg Oral Daily  . fluticasone  2 puff Inhalation BID  . furosemide  160 mg Oral BID  . insulin aspart  0-9 Units Subcutaneous TID WC  . insulin glargine  12 Units Subcutaneous QHS  . Ipratropium-Albuterol  1 puff Inhalation TID  . levofloxacin  250 mg Oral QHS  . pantoprazole  40 mg Oral Daily  . senna  1 tablet Oral BID  . spironolactone  25 mg Oral Daily  . warfarin  2 mg Oral ONCE-1800  . Warfarin - Pharmacist Dosing Inpatient   Does not apply q1800  . DISCONTD: insulin glargine  10 Units Subcutaneous QHS    Inpatient warfarin doses 5/21 -  5/27 = 6, 8, 6, 6, 5, 2, 2mg   Assessment:  61 yo F on chronic coumadin PTA for A.Fib  Home warfarin dose is reportedly 6 mg daily    Drug Interaction: Levaquin causing increase in INR.  INR currently therapeutic on reduced warfarin dosage.  Anticipate warfarin requirements will approach PTA values once quinolone therapy is completed.  Amiodarone dosage reduced to 100mg  daily.  This dosage reduction could eventually result in increased warfarin dosage requirements.  No bleeding reported in chart notes.  Goal of Therapy:  INR 2-3  Plan:   Increase warfarin to 4mg  x1 today at 1800  Daily INR  Needs close INR f/u after discharge   Hope Budds PharmD, BCPS 9:10 AM 05/05/2012

## 2012-05-05 NOTE — Progress Notes (Signed)
Inpatient Diabetes Program Recommendations  AACE/ADA: New Consensus Statement on Inpatient Glycemic Control (2009)  Target Ranges:  Prepandial:   less than 140 mg/dL      Peak postprandial:   less than 180 mg/dL (1-2 hours)      Critically ill patients:  140 - 180 mg/dL   Reason for Visit: Hyperglycemia Results for [name], [name] (MRN [id number]) as of 05/05/2012 14:08  Ref. Range 05/04/2012 07:48 05/04/2012 11:41 05/04/2012 17:21 05/04/2012 21:19 05/05/2012 11:52  Glucose-Capillary Latest Range: 70-99 mg/dL 782 (H) 956 (H) 213 (H) 230 (H) 179 (H)    Inpatient Diabetes Program Recommendations Insulin - Meal Coverage: Add meal coverage insulin - Novolog 3 units [id number]  Note: May also benefit from increasing correction insulin to moderate.

## 2012-05-06 ENCOUNTER — Ambulatory Visit: Payer: Medicare HMO | Admitting: Internal Medicine

## 2012-05-06 LAB — GLUCOSE, CAPILLARY
Glucose-Capillary: 162 mg/dL — ABNORMAL HIGH (ref 70–99)
Glucose-Capillary: 187 mg/dL — ABNORMAL HIGH (ref 70–99)
Glucose-Capillary: 156 mg/dL — ABNORMAL HIGH (ref 70–99)
Glucose-Capillary: 187 mg/dL — ABNORMAL HIGH (ref 70–99)
Glucose-Capillary: 181 mg/dL — ABNORMAL HIGH (ref 70–99)

## 2012-05-06 MED ORDER — LEVOFLOXACIN 250 MG PO TABS: 250.0000 mg | ORAL_TABLET | Freq: Every day | ORAL | Status: DC

## 2012-05-06 MED ORDER — DOXYCYCLINE HYCLATE 50 MG PO CAPS: 100.0000 mg | ORAL_CAPSULE | Freq: Two times a day (BID) | ORAL | Status: AC

## 2012-05-06 MED ORDER — INSULIN GLARGINE 100 UNIT/ML ~~LOC~~ SOLN: 12.0000 [IU] | Freq: Every day | SUBCUTANEOUS | Status: DC

## 2012-05-06 NOTE — Progress Notes (Signed)
   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  Secondary Proteus wound infection.     Action/Plan:   lives with family-- ACTIVE WITH [facility]--   Anticipated DC Date:  05/06/2012   Anticipated DC Plan:  HOME W HOME HEALTH SERVICES  In-house referral  NA      DC Planning Services  CM consult      [facility] Choice  Resumption Of Svcs/PTA Provider   Choice offered to / List presented to:  C-1 Patient   DME arranged  NA      DME agency  NA     HH arranged  HH-1 RN  HH-2 PT  HH-3 OT      [facility] agency  [facility]   Status of service:  Completed, signed off Medicare Important Message given?   (If response is "NO", the following Medicare IM given date fields will be blank) Date Medicare IM given:   Date Additional Medicare IM given:    Discharge Disposition:  HOME W HOME HEALTH SERVICES  Per UR Regulation:  Reviewed for med. necessity/level of care/duration of stay  If discussed at Long Length of Stay Meetings, dates discussed:   05/06/2012    Comments:  05-06-12 Raiford Noble, RN,BSN,CM [phone] Received call from pt's nurse inquiring about dressing changes and supplies. [facility] liason can better answer those questions. Made [name] with [facility] aware that patient has questions regarding hhc.    05-05-12 Raiford Noble, RN,BSN,CM [phone] Pt active with [facility]-- NEED ORDER FOR HHC RN FOR WOUND CARE PER PROTOCOL, also IF PT/INR needed please order HHC RN to draw. Active for PT/OT services with [facility]. [name] following.

## 2012-05-06 NOTE — Progress Notes (Signed)
Dr. Alfonso Patten office notified about pt discharge. I was told he would call back.

## 2012-05-19 NOTE — Discharge Summary (Signed)
Physician Discharge Summary  Patient ID: [name] MRN: [id number] DOB/AGE: [birth year] 61 y.o.  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 Secondary Proteus wound infection. Diabetes mellitus. 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 Atrial fibrillation with controlled ventricular rate Morbid obesity. Sleep apnea with hypoventilation syndrome. Combined systolic and diastolic congestive heart failure, compensated. Psoriasis. Asthmatic bronchitis stable. Chronic venous insufficiency. Candida vaginitis.  Discharged Condition: Improved  Operations/Procedues: None.   Hospital Course:  See admission H&P for details. Patient was admitted for further treatment of worsening of exfoliative rash on her lower abdominal region and pelvic area. Patient was seen by infectious disease Dr. Marcha Dutton. Her extensive list of medications were reviewed as well as the possibility of this being a drug reaction was considered. Patient's methotrexate was held. She is maintained on vancomycin and Levaquin. Patient was seen by the wound care team for intensive therapy. Over the subsequent days her wounds did gradually improve. A wound culture was positive for Proteus infection which was continually treated. Patient did require adjustments of her diabetic medication while hospitalized. She is also encouraged to use her CPAP machine which she had not been doing consistently for sleep apnea. Patient's abdominal wounds did eventually heal to the point of her being manageable as an outpatient with home care. Patient was instructed on basic wound care with plans for close followup by home health.  During hospital stay patient did experience transient bradycardia. She was seen by her cardiologist Dr. Sharyn Lull. Her medications were adjusted with subcutaneous resolution. She was  continued on her amiodarone however. By 5/29 patient was stable with her multiple medical issues. It has been suggested before that patient consider assisted living due to her multiple comorbid conditions. She does have limited help at home. Despite this patient insist on returning to her home environment.   Disposition: 01-Home or Self Care  Discharge Orders    Future Appointments: Provider: Department: Dept Phone: Center:   05/25/2012 10:30 AM Cliffton Asters, MD [facility] [phone] [facility]     Future Orders Please Complete By Expires   Diet - low sodium heart healthy      Increase activity slowly      Discharge instructions      Comments:   Follow up with primary care physician in two weeks.   Remove dressing in 24 hours      Discharge wound care:      Comments:   HHRN for wound care protocol.     Medication List  As of 05/19/2012  9:34 PM   STOP taking these medications         levofloxacin 500 MG tablet      metoprolol tartrate 25 MG tablet         TAKE these medications         albuterol-ipratropium 18-103 MCG/ACT inhaler   Commonly known as: COMBIVENT   Inhale 2 puffs into the lungs 3 (three) times daily.      allopurinol 100 MG tablet   Commonly known as: ZYLOPRIM   Take 1 tablet by mouth daily.      amiodarone 200 MG tablet   Commonly known as: PACERONE   Take 200 mg by mouth daily.      aspirin 81 MG tablet   Take 81 mg by mouth daily.      atorvastatin 20 MG tablet   Commonly known as: LIPITOR  Take 20 mg by mouth at bedtime.      clobetasol ointment 0.05 %   Commonly known as: TEMOVATE   Apply 1 application topically daily. Use as directed on legs      digoxin 0.125 MG tablet   Commonly known as: LANOXIN   Take 1 tablet by mouth daily.      fluticasone 220 MCG/ACT inhaler   Commonly known as: FLOVENT HFA   Inhale 2 puffs into the lungs 3 (three) times daily.      furosemide 80 MG tablet   Commonly known as: LASIX   Take 2 tablets  (160 mg total) by mouth 2 (two) times daily.      insulin aspart 100 UNIT/ML injection   Commonly known as: novoLOG   Inject 0-20 Units into the skin 3 (three) times daily before meals.      insulin glargine 100 UNIT/ML injection   Commonly known as: LANTUS   Inject 12 Units into the skin at bedtime. 10 units at bedtime      pantoprazole 40 MG tablet   Commonly known as: PROTONIX   Take 40 mg by mouth daily.      spironolactone 25 MG tablet   Commonly known as: ALDACTONE   Take 25 mg by mouth daily.      warfarin 6 MG tablet   Commonly known as: COUMADIN   Take 6 mg by mouth daily.      zolpidem 5 MG tablet   Commonly known as: AMBIEN   Take 5 mg by mouth at bedtime as needed. For sleep             Signed: [name] Tayson Schnelle 05/06/2012, 8:24 PM

## 2012-05-25 ENCOUNTER — Ambulatory Visit: Payer: Medicare HMO | Admitting: Internal Medicine

## 2012-05-26 ENCOUNTER — Ambulatory Visit (INDEPENDENT_AMBULATORY_CARE_PROVIDER_SITE_OTHER): Payer: Medicare HMO | Admitting: Internal Medicine

## 2012-05-26 VITALS — BP 219/43 | HR 66 | Temp 98.0°F | Ht 63.0 in | Wt 264.2 lb

## 2012-05-26 DIAGNOSIS — T8450XA Infection and inflammatory reaction due to unspecified internal joint prosthesis, initial encounter: Secondary | ICD-10-CM

## 2012-05-26 MED ORDER — LEVOFLOXACIN 250 MG PO TABS: 250.0000 mg | ORAL_TABLET | Freq: Every day | ORAL | Status: AC

## 2012-05-26 NOTE — Progress Notes (Signed)
Patient ID: [name], female   DOB: June 13, 1951, 61 y.o.   MRN: [id number]    [facility] for Infectious Disease  Patient Active Problem List  Diagnosis  . HYPERLIPIDEMIA  . OBSTRUCTIVE SLEEP APNEA  . HYPERTENSION  . ATRIAL FIBRILLATION WITH RAPID VENTRICULAR RESPONSE  . Acute combined systolic and diastolic heart failure  . 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 BRONCHITIS, RECURRENT  . ASTHMA  . Obesity hypoventilation syndrome  . Prosthetic joint infection  . Group B streptococcal infection  . Gout  . Yeast infection involving the vagina and surrounding area  . SBO (small bowel obstruction)  . DM (diabetes mellitus)  . Chronic anticoagulation  . Oral candidiasis  . Laceration of leg  . Chronic venous insufficiency    Patient's Medications  New Prescriptions   LEVOFLOXACIN (LEVAQUIN) 250 MG TABLET    Take 1 tablet (250 mg total) by mouth daily.  Previous Medications   ALBUTEROL-IPRATROPIUM (COMBIVENT) 18-103 MCG/ACT INHALER    Inhale 2 puffs into the lungs 3 (three) times daily.   ALLOPURINOL (ZYLOPRIM) 100 MG TABLET    Take 1 tablet by mouth daily.   AMIODARONE (PACERONE) 200 MG TABLET    Take 200 mg by mouth daily.   ASPIRIN 81 MG TABLET    Take 81 mg by mouth daily.     ATORVASTATIN (LIPITOR) 20 MG TABLET    Take 20 mg by mouth at bedtime.   CLOBETASOL (TEMOVATE) 0.05 % OINTMENT    Apply 1 application topically daily. Use as directed on legs   DIGOXIN (LANOXIN) 0.125 MG TABLET    Take 1 tablet by mouth daily.   FLUTICASONE (FLOVENT HFA) 220 MCG/ACT INHALER    Inhale 2 puffs into the lungs 3 (three) times daily.     FUROSEMIDE (LASIX) 80 MG TABLET    Take 2 tablets (160 mg total) by mouth 2 (two) times daily.   INSULIN ASPART (NOVOLOG) 100 UNIT/ML INJECTION    Inject 0-20 Units into the skin 3 (three) times daily before meals.   INSULIN GLARGINE (LANTUS) 100 UNIT/ML INJECTION    Inject 12 Units into the skin at bedtime. 10 units at bedtime   PANTOPRAZOLE (PROTONIX) 40 MG TABLET     Take 40 mg by mouth daily.   SPIRONOLACTONE (ALDACTONE) 25 MG TABLET    Take 25 mg by mouth daily.     WARFARIN (COUMADIN) 6 MG TABLET    Take 6 mg by mouth daily.   ZOLPIDEM (AMBIEN) 5 MG TABLET    Take 5 mg by mouth at bedtime as needed. For sleep  Modified Medications   No medications on file  Discontinued Medications   DOXYCYCLINE (DORYX) 100 MG DR CAPSULE    Take 100 mg by mouth 2 (two) times daily.    Subjective: [name] is in for her hospital followup visit. She was hospitalized last month after she developed a hypersensitivity dermatitis that was felt to be due to the interaction of methotrexate and her levofloxacin. The methotrexate was held and she continued on levofloxacin with improvement. When my partner, Dr. 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 For reasons that are not clear in her records the levofloxacin was discontinued and she was discharged on doxycycline. She states that she's had some increased soreness in her knees since discharge.  Objective: Temp: 98 F (36.7 C) (06/18 1510) Temp src:  Oral (06/18 1510) BP: 219/43 mmHg (06/18 1516) Pulse Rate: 66  (06/18 1516)  General: She appears a little bit worried but is in no distress Skin: No rash The incision over her left knee is healed. The knee is not particularly warm, red or swollen.   Assessment: I do not really see any evidence that her left prosthetic joint infection has flared up but it appears that she was tolerating levofloxacin while in the hospital and that would offer much better coverage for group B strep and doxycycline. I will change her back to levofloxacin.  Plan: 1. Change doxycycline back to levofloxacin. 2. Followup in 3 months.   Cliffton Asters, MD [facility] for Infectious Disease [facility] [phone] pager   [phone] cell 05/26/2012, 3:37  PM

## 2012-05-27 ENCOUNTER — Telehealth: Payer: Self-pay | Admitting: *Deleted

## 2012-05-27 NOTE — Telephone Encounter (Signed)
Pt went to [facility] & was told that there is an interaction with the levaquin & her amiodarone. She wants to know what to do. I called the pharmacist who states it may increase the risk of arrythmias. He is faxing the interaction page here. I told pt I will call her after I speak with the doctor

## 2012-05-27 NOTE — Telephone Encounter (Signed)
I am not esp worried about this at present.T hanks John

## 2012-05-27 NOTE — Telephone Encounter (Signed)
As best I can tell, she has been on the combination of levofloxacin and amiodarone since last year with the exception of the brief period of time she was off levofloxacin after her recent discharge. There are a few suitable alternative to the levofloxacin for suppressing her left prosthetic knee infection. I will ask my partner, Dr. Daiva Eves, his opinion on this matter since he has been following her.

## 2012-05-28 ENCOUNTER — Telehealth: Payer: Self-pay | Admitting: *Deleted

## 2012-05-28 NOTE — Telephone Encounter (Signed)
I called & spoke with her. Told her it was ok to take the levaquin with her other med she was worried about. I told her 2 of her mds said it was ok. She does have the medicine there & will start. I called the pharmacy back & told the tech that I had spoken with the pt & she is to take it

## 2012-05-28 NOTE — Telephone Encounter (Signed)
Please instructed the pharmacy to refill levofloxacin.

## 2012-06-06 ENCOUNTER — Telehealth: Payer: Self-pay | Admitting: Internal Medicine

## 2012-06-06 NOTE — Telephone Encounter (Signed)
Ms. harnish called stating that her leg/knee have had increasing pain over the last week, but this is also in the setting of her stopping her pain medications since last Monday. She states that her knee is not particularly swollen ,red, warm, denies fever. She has been on chronic suppression with levoflox for group b strep pji.   It is difficult to tell if this is necessarily a flare of infection from her knee. She was seen last month in ID clinic where it appeared fine, given that she was temporarily switched to doxycycline due to possible drug interaction of FQ with MTX.  i have called in rx of hydrocodone 5/APAP 325 #30 NR, she will try meds over the next 24hrs see if any relief, to check in tomorrow. Then will establish appt in ID clinic vs. Needing more urgent eval thru ED.

## 2012-06-08 ENCOUNTER — Telehealth: Payer: Self-pay | Admitting: *Deleted

## 2012-06-08 NOTE — Telephone Encounter (Signed)
The patient stated this morning that she was not returning to the orthopedic MD's office due to not wanting her leg removed.  Dr. Daiva Eves does not have any openings prior to the scheduled appt in Sept.  Dr. Drue Second does have an appt for tomorrow, July 2 @ 1100.  The pt was offered this appt and she will be coming at that time.

## 2012-06-08 NOTE — Telephone Encounter (Signed)
Pt upset about continuing knee pain.  Slightly better after starting pain rx prescribed by Dr. Drue Second over the weekend.  Upset that she was told by her surgeon that she may need to have her left amputated.  Pt stated that she was going to call Dr. Willey Blade to see if she should go to the ED.  RN shared that Dr. Drue Second did mention that the ED would be an option for the pt.  Pt ended the phone call without responding.

## 2012-06-08 NOTE — Telephone Encounter (Signed)
Please see if you can move her appointment with Dr. Daiva Eves up to as soon as possible. It also suggested [name] that she contact her orthopedic surgeon, Dr. Yisroel Ramming.

## 2012-06-09 ENCOUNTER — Emergency Department (HOSPITAL_COMMUNITY): Payer: Medicare HMO

## 2012-06-09 ENCOUNTER — Encounter (HOSPITAL_COMMUNITY): Payer: Self-pay | Admitting: *Deleted

## 2012-06-09 ENCOUNTER — Encounter: Payer: Self-pay | Admitting: Internal Medicine

## 2012-06-09 ENCOUNTER — Ambulatory Visit (INDEPENDENT_AMBULATORY_CARE_PROVIDER_SITE_OTHER): Payer: Medicare HMO | Admitting: Internal Medicine

## 2012-06-09 ENCOUNTER — Emergency Department (HOSPITAL_COMMUNITY)
Admission: EM | Admit: 2012-06-09 | Discharge: 2012-06-09 | Disposition: A | Discharge status: Home / Self Care | Payer: Medicare HMO | Attending: Emergency Medicine | Admitting: Emergency Medicine

## 2012-06-09 VITALS — BP 167/75 | HR 73 | Temp 98.4°F

## 2012-06-09 DIAGNOSIS — M25562 Pain in left knee: Secondary | ICD-10-CM

## 2012-06-09 DIAGNOSIS — R609 Edema, unspecified: Secondary | ICD-10-CM | POA: Insufficient documentation

## 2012-06-09 DIAGNOSIS — G4733 Obstructive sleep apnea (adult) (pediatric): Secondary | ICD-10-CM | POA: Insufficient documentation

## 2012-06-09 DIAGNOSIS — E785 Hyperlipidemia, unspecified: Secondary | ICD-10-CM | POA: Insufficient documentation

## 2012-06-09 DIAGNOSIS — Z8673 Personal history of transient ischemic attack (TIA), and cerebral infarction without residual deficits: Secondary | ICD-10-CM | POA: Insufficient documentation

## 2012-06-09 DIAGNOSIS — J45909 Unspecified asthma, uncomplicated: Secondary | ICD-10-CM | POA: Insufficient documentation

## 2012-06-09 DIAGNOSIS — F3289 Other specified depressive episodes: Secondary | ICD-10-CM | POA: Insufficient documentation

## 2012-06-09 DIAGNOSIS — E119 Type 2 diabetes mellitus without complications: Secondary | ICD-10-CM | POA: Insufficient documentation

## 2012-06-09 DIAGNOSIS — M25469 Effusion, unspecified knee: Secondary | ICD-10-CM | POA: Insufficient documentation

## 2012-06-09 DIAGNOSIS — F329 Major depressive disorder, single episode, unspecified: Secondary | ICD-10-CM | POA: Insufficient documentation

## 2012-06-09 DIAGNOSIS — Z794 Long term (current) use of insulin: Secondary | ICD-10-CM | POA: Insufficient documentation

## 2012-06-09 DIAGNOSIS — Z96659 Presence of unspecified artificial knee joint: Secondary | ICD-10-CM | POA: Insufficient documentation

## 2012-06-09 DIAGNOSIS — M25569 Pain in unspecified knee: Secondary | ICD-10-CM | POA: Insufficient documentation

## 2012-06-09 DIAGNOSIS — Z79899 Other long term (current) drug therapy: Secondary | ICD-10-CM | POA: Insufficient documentation

## 2012-06-09 DIAGNOSIS — Z7982 Long term (current) use of aspirin: Secondary | ICD-10-CM | POA: Insufficient documentation

## 2012-06-09 DIAGNOSIS — T8450XA Infection and inflammatory reaction due to unspecified internal joint prosthesis, initial encounter: Secondary | ICD-10-CM

## 2012-06-09 LAB — SEDIMENTATION RATE: Sed Rate: 139 mm/hr — ABNORMAL HIGH (ref 0–22)

## 2012-06-09 LAB — BASIC METABOLIC PANEL
CO2: 22 mEq/L (ref 19–32)
Calcium: 9.2 mg/dL (ref 8.4–10.5)
Creatinine, Ser: 2.47 mg/dL — ABNORMAL HIGH (ref 0.50–1.10)
GFR calc Af Amer: 23 mL/min — ABNORMAL LOW (ref >90)
GFR calc non Af Amer: 20 mL/min — ABNORMAL LOW (ref >90)
Sodium: 138 mEq/L (ref 135–145)

## 2012-06-09 LAB — SYNOVIAL CELL COUNT + DIFF, W/ CRYSTALS: Neutrophil, Synovial: 85 % — ABNORMAL HIGH (ref 0–25)

## 2012-06-09 LAB — CBC WITH DIFFERENTIAL/PLATELET
Basophils Absolute: 0 10*3/uL (ref 0.0–0.1)
Basophils Relative: 0 % (ref 0–1)
Eosinophils Absolute: 0.1 10*3/uL (ref 0.0–0.7)
Eosinophils Relative: 1 % (ref 0–5)
HCT: 28.7 % — ABNORMAL LOW (ref 36.0–46.0)
Hemoglobin: 9.3 g/dL — ABNORMAL LOW (ref 12.0–15.0)
Lymphocytes Relative: 7 % — ABNORMAL LOW (ref 12–46)
Lymphocytes Relative: 10 % — ABNORMAL LOW (ref 12–46)
Lymphs Abs: 0.6 10*3/uL — ABNORMAL LOW (ref 0.7–4.0)
MCHC: 31.1 g/dL (ref 30.0–36.0)
MCV: 86.5 fL (ref 78.0–100.0)
Monocytes Absolute: 0.5 10*3/uL (ref 0.1–1.0)
Monocytes Absolute: 0.7 10*3/uL (ref 0.1–1.0)
Monocytes Relative: 5 % (ref 3–12)
Neutro Abs: 7.9 10*3/uL — ABNORMAL HIGH (ref 1.7–7.7)
Platelets: 299 10*3/uL (ref 150–400)
RDW: 16.9 % — ABNORMAL HIGH (ref 11.5–15.5)
WBC: 9.2 10*3/uL (ref 4.0–10.5)
WBC: 9.8 10*3/uL (ref 4.0–10.5)

## 2012-06-09 LAB — C-REACTIVE PROTEIN: CRP: 9.96 mg/dL — ABNORMAL HIGH (ref <0.60)

## 2012-06-09 LAB — URIC ACID: Uric Acid, Serum: 9.3 mg/dL — ABNORMAL HIGH (ref 2.4–7.0)

## 2012-06-09 MED ORDER — OXYCODONE-ACETAMINOPHEN 5-325 MG PO TABS
1.0000 | ORAL_TABLET | Freq: Once | ORAL | Status: AC
  Administered 2012-06-09: 1 via ORAL
  Filled 2012-06-09: qty 1

## 2012-06-09 MED ORDER — OXYCODONE-ACETAMINOPHEN 5-325 MG PO TABS: 1.0000 | ORAL_TABLET | ORAL | Status: AC | PRN

## 2012-06-09 NOTE — ED Notes (Signed)
Lab at bedside

## 2012-06-09 NOTE — ED Notes (Signed)
Patient currently resting quietly in wheelchair (patient refuses to use bed); no respiratory or acute distress noted.  Patient updated on plan of care; informed patient that MD will be in to do a knee tap; patient has no other questions or concerns at this time; will continue to monitor.

## 2012-06-09 NOTE — ED Notes (Signed)
Received bedside report from [name], [place].  Patient currently resting quietly in bed; no respiratory or acute distress noted.  Patient updated on plan of care; informed patient that we are currently waiting on lab results to come back.  Patient has no other questions or concerns at this time; will continue to monitor.

## 2012-06-09 NOTE — ED Provider Notes (Signed)
61 year old female patient here with possible septic left knee. Patient initially seen by Dr. Anitra Lauth. 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 Patient says for the past week she's been having worsening left knee pain swelling and difficulty walking and putting weight on her left knee. Patient says that she saw her PCP today who recommended that she might need to get her knee tapped. Here patient is without fever normal white count. Patient's left knee is more erythematous swollen warm and painful with range of motion compared to the right knee. There is concern for possible infection in her left knee. Orthopedics has been consulted and has agreed to tap the left knee. Results for orders placed during the hospital encounter of 06/09/12  CBC WITH DIFFERENTIAL      Component Value Range   WBC 9.8  4.0 - 10.5 K/uL   RBC 3.27 (*) 3.87 - 5.11 MIL/uL   Hemoglobin 8.8 (*) 12.0 - 15.0 g/dL   HCT 16.1 (*) 09.6 - 04.5 %   MCV 86.5  78.0 - 100.0 fL   MCH 26.9  26.0 - 34.0 pg   MCHC 31.1  30.0 - 36.0 g/dL   RDW 40.9 (*) 81.1 - 91.4 %   Platelets 299  150 - 400 K/uL   Neutrophils Relative 81 (*) 43 - 77 %   Neutro Abs 7.9 (*) 1.7 - 7.7 K/uL   Lymphocytes Relative 10 (*) 12 - 46 %   Lymphs Abs 1.0  0.7 - 4.0 K/uL   Monocytes Relative 8  3 - 12 %   Monocytes Absolute 0.7  0.1 - 1.0 K/uL   Eosinophils Relative 1  0 - 5 %   Eosinophils Absolute 0.1  0.0 - 0.7 K/uL   Basophils Relative 0  0 - 1 %   Basophils Absolute 0.0  0.0 - 0.1 K/uL  BASIC METABOLIC PANEL      Component Value Range   Sodium 138  135 - 145 mEq/L   Potassium 4.4  3.5 - 5.1 mEq/L   Chloride 102  96 - 112 mEq/L   CO2 22  19 - 32 mEq/L   Glucose, Bld 165 (*) 70 - 99 mg/dL   BUN 45 (*) 6 - 23 mg/dL   Creatinine, Ser 7.82 (*) 0.50 - 1.10 mg/dL   Calcium 9.2  8.4 - 95.6 mg/dL   GFR calc non Af Amer 20 (*) >90 mL/min   GFR calc Af Amer 23 (*) >90 mL/min  SEDIMENTATION RATE      Component Value Range   Sed Rate  139 (*) 0 - 22 mm/hr  GRAM STAIN      Component Value Range   Specimen Description SYNOVIAL KNEE LEFT     Special Requests NONE     Gram Stain       Value: ABUNDANT WBC PRESENT,BOTH PMN AND MONONUCLEAR     NO ORGANISMS SEEN   Report Status 06/09/2012 FINAL    CELL COUNT + DIFF,  W/ CRYST-SYNVL FLD      Component Value Range   Color, Synovial PINK (*) YELLOW   Appearance-Synovial CLOUDY (*) CLEAR   Crystals, Fluid NO CRYSTALS SEEN     WBC, Synovial 21308 (*) 0 - 200 /cu mm   Neutrophil, Synovial 85 (*) 0 - 25 %   Lymphocytes-Synovial Fld 8  0 - 20 %   Monocyte-Macrophage-Synovial Fluid 7 (*) 50 - 90 %    DG Knee Complete 4 Views Left (Final  result)   Result time:06/09/12 1841    Final result by Rad Results In Interface (06/09/12 18:41:02)    Narrative:   *RADIOLOGY REPORT*  Clinical Data: Pain and swelling for 1 week. History of knee replacement in 2002.  LEFT KNEE - COMPLETE 4+ VIEW  Comparison: 07/26/2011  Findings: Patient has had total knee arthroplasty. There is lucency at the bone cement and the bone metal interface of the distal femoral prosthesis. There is also lucency along the proximal medial aspect of the tibial prosthesis, increased since previous exam. No evidence for acute fracture. Joint effusion is suspected.  IMPRESSION:  1. Lucency associated with the femoral and tibial prosthesis, suspicious for loosening or infection. 2. Joint effusion. 3. No acute fracture.  Original Report Authenticated By: Patterson Hammersmith, M.D.     Dr. Luiz Blare was the consultant orthopedist. Patient has been seen by orthopedics who have done a left knee arthrocentesis. They sent off the fluid for studies. They say that the patient has had chronic infections of that knee for many years and making this is an exacerbation of that infection. Patient felt remarkably better after arthrocentesis. They recommend patient to stay on her daily Levaquin and follow up in their clinic in 5  days. Patient without evidence of systemic illness no fevers nausea vomiting no hypertension. Patient appears well and is agreeable with this plan of continuing the Levaquin and follow up with orthopedics if she is not systemically ill and her pain is now well-controlled. Patient was given extra pain medications to take at home and is otherwise stable for discharge.  Case discussed with Dr. Karma Ganja.  Sherryl Manges, MD 06/09/12 [phone]

## 2012-06-09 NOTE — ED Notes (Signed)
The pt has had a swollen lt knee for one week and she saw her doctor today and was told her knee needed to be tapped and she was sent here

## 2012-06-09 NOTE — ED Notes (Signed)
Patient currently sitting up in wheelchair; no respiratory or acute distress noted.  Orthopedic surgeon at bedside speaking with patient; will continue to monitor.

## 2012-06-09 NOTE — ED Notes (Signed)
Patient currently sitting up in wheelchair; no respiratory or acute distress noted.  Patient wheeled to bathroom by nurse tech.  Patient has no other questions or concerns at this time; will continue to monitor.

## 2012-06-09 NOTE — ED Provider Notes (Signed)
History   This chart was scribed for Gwyneth Sprout, MD by Melba Coon. The patient was seen in room TR11C/TR11C and the patient's care was started at 6:45PM.    CSN: [id number]  Arrival date & time 06/09/12  1800   None     Chief Complaint  Patient presents with  . Joint Swelling    (Consider location/radiation/quality/duration/timing/severity/associated sxs/prior treatment) HPI Apoorva L Pegues is a 61 y.o. female who presents to the Emergency Department complaining of constant, moderate left knee joint pain and swelling with an onset a week ago. Pt went to PCP who stated that she needed to present here at the ED to get her knee tapped; PCP believes it may be goiter or gout. Pt has knee replacement of left knee. ROM of the knee aggravates the pain. Oxycodone alleviates the pain. No HA, fever, neck pain, sore throat, rash, back pain, CP, SOB, abd pain, n/v/d, dysuria, or extremity weakness, numbness, or tingling. Hx of DM. Allergic to Daptomycin; Morphine and related; Cephalexin; Celecoxib; Codeine; Fluoxetine hcl; Latex; Ofloxacin; Penicillins; Rofecoxib; and Sulfonamide derivatives. No other pertinent medical symptoms.  PCP: Dr. Ilsa Iha  Past Medical History  Diagnosis Date  . Asthma   . Bronchitis   . Allergic rhinitis   . Diabetes mellitus   . Stroke   . HTN (hypertension)   . OSA (obstructive sleep apnea)   . DJD (degenerative joint disease)   . Kidney stones   . Depression   . Hyperlipidemia   . Complication of anesthesia     trouble waking up    Past Surgical History  Procedure Date  . Appendectomy   . Cholecystectomy   . Total knee arthroplasty   . Total abdominal hysterectomy   . Back surgery   . Cystectomy     left hand    Family History  Problem Relation Age of Onset  . Heart attack Father   . Asthma Father   . Heart disease Paternal Uncle   . Rectal cancer Paternal Aunt   . Other Mother     mva    History  Substance Use Topics  . Smoking  status: Former Smoker    Types: Cigarettes  . Smokeless tobacco: Never Used   Comment: smoked for 1.5 yrs about 2 cigs a day ,only if stressed  . Alcohol Use: No    OB History    Grav Para Term Preterm Abortions TAB SAB Ect Mult Living                  Review of Systems 10 Systems reviewed and all are negative for acute change except as noted in the HPI.   Allergies  Daptomycin; Morphine and related; Cephalexin; Celecoxib; Codeine; Fluoxetine hcl; Latex; Ofloxacin; Penicillins; Rofecoxib; and Sulfonamide derivatives  Home Medications   Current Outpatient Rx  Name Route Sig Dispense Refill  . IPRATROPIUM-ALBUTEROL 18-103 MCG/ACT IN AERO Inhalation Inhale 2 puffs into the lungs 3 (three) times daily. 1 Inhaler 4  . ALLOPURINOL 100 MG PO TABS Oral Take 1 tablet by mouth daily.    . AMIODARONE HCL 200 MG PO TABS Oral Take 200 mg by mouth daily.    . ASPIRIN 81 MG PO TABS Oral Take 81 mg by mouth daily.      . ATORVASTATIN CALCIUM 20 MG PO TABS Oral Take 20 mg by mouth at bedtime.    [name] CLOBETASOL PROPIONATE 0.05 % EX OINT Topical Apply 1 application topically daily. Use as directed on legs    .  DIGOXIN 0.125 MG PO TABS Oral Take 1 tablet by mouth daily.    . FUROSEMIDE 80 MG PO TABS Oral Take 2 tablets (160 mg total) by mouth 2 (two) times daily. 120 tablet 3  . INSULIN ASPART 100 UNIT/ML [place] SOLN Subcutaneous Inject 0-20 Units into the skin 3 (three) times daily before meals.    . INSULIN GLARGINE 100 UNIT/ML [place] SOLN Subcutaneous Inject 10 Units into the skin at bedtime.    [name] OLMESARTAN MEDOXOMIL 20 MG PO TABS Oral Take 20 mg by mouth daily.    [name] PANTOPRAZOLE SODIUM 40 MG PO TBEC Oral Take 40 mg by mouth daily.    [name] SPIRONOLACTONE 25 MG PO TABS Oral Take 25 mg by mouth daily.      . WARFARIN SODIUM 6 MG PO TABS Oral Take 6 mg by mouth daily.    [name] ZOLPIDEM TARTRATE 5 MG PO TABS Oral Take 5 mg by mouth at bedtime as needed. For sleep      BP 185/80  Pulse 73  Temp 98.2 F (36.8  C)  Resp 16  SpO2 98%  Physical Exam  Nursing note and vitals reviewed. Constitutional: She is oriented to person, place, and time. She appears well-developed and well-nourished. No distress.  HENT:  Head: Normocephalic and atraumatic.  Right Ear: External ear normal.  Left Ear: External ear normal.  Eyes: EOM are normal.  Neck: Normal range of motion. No tracheal deviation present.  Cardiovascular: Normal rate.   Pulmonary/Chest: Effort normal. No respiratory distress.  Abdominal: Soft. There is no tenderness.  Musculoskeletal: Normal range of motion. She exhibits edema (Left knee edema with erythema, warmth, and pain with ROM).  Neurological: She is alert and oriented to person, place, and time.  Skin: Skin is warm and dry. No rash noted.  Psychiatric: She has a normal mood and affect. Her behavior is normal.    ED Course  Procedures (including critical care time)  DIAGNOSTIC STUDIES: Oxygen Saturation is 98% on room air, normal by my interpretation.    COORDINATION OF CARE:  6:45PM - EDMD will not put needles in a replaced knee; pt states that PCP said it was fine; EDMD will consult with PCP.   Labs Reviewed - No data to display Dg Knee Complete 4 Views Left  06/09/2012  *RADIOLOGY REPORT*  Clinical Data: Pain and swelling for 1 week.  History of knee replacement in 2002.  LEFT KNEE - COMPLETE 4+ VIEW  Comparison: 07/26/2011  Findings: Patient has had total knee arthroplasty.  There is lucency at the bone cement and the bone metal interface of the distal femoral prosthesis.  There is also lucency along the proximal medial aspect of the tibial prosthesis, increased since previous exam.  No evidence for acute fracture.  Joint effusion is suspected.  IMPRESSION:  1.  Lucency associated with the femoral and tibial prosthesis, suspicious for loosening or infection. 2.  Joint effusion. 3.  No acute fracture.  Original Report Authenticated By: Patterson Hammersmith, M.D.     No  diagnosis found.    MDM   Patient with a history of prior septic joint in the left knee with a prior knee replacement who is currently still on antibiotics was sent here today for aspiration of the knee. The knee is hot, erythematous and swollen. For aspiration of the knee and consultation with ID about possible change in antibiotics. Patient denies any systemic symptoms.  I personally performed the services described in this documentation, which was  scribed in my presence.  The recorded information has been reviewed and considered.         Gwyneth Sprout, MD 06/09/12 1900

## 2012-06-09 NOTE — ED Notes (Signed)
Dr. Luiz Blare at bedside with RN and knee tap equipment.

## 2012-06-09 NOTE — ED Notes (Signed)
Patient given discharge paperwork; went over discharge instructions with patient.  Patient instructed to take Percocet as directed, to not drive while taking Percocet, to follow up with primary care physician if symptoms persist, to call Dr. Luiz Blare on Monday, and to return to the ED for new, worsening, or concerning symptoms.

## 2012-06-10 LAB — C-REACTIVE PROTEIN: CRP: 12.01 mg/dL — ABNORMAL HIGH (ref <0.60)

## 2012-06-12 NOTE — ED Provider Notes (Signed)
I saw and evaluated the patient, reviewed the resident's note and I agree with the findings and plan.  Ethelda Chick, MD 06/12/12 [phone]

## 2012-06-13 LAB — BODY FLUID CULTURE

## 2012-06-14 LAB — ANAEROBIC CULTURE

## 2012-08-15 ENCOUNTER — Inpatient Hospital Stay (HOSPITAL_COMMUNITY)
Admission: EM | Admit: 2012-08-15 | Discharge: 2012-08-29 | DRG: 291 | Disposition: A | Discharge status: Home / Self Care | Payer: Medicare HMO | Attending: Internal Medicine | Admitting: Internal Medicine

## 2012-08-15 ENCOUNTER — Encounter (HOSPITAL_COMMUNITY): Payer: Self-pay | Admitting: Emergency Medicine

## 2012-08-15 ENCOUNTER — Emergency Department (HOSPITAL_COMMUNITY): Payer: Medicare HMO

## 2012-08-15 DIAGNOSIS — I129 Hypertensive chronic kidney disease with stage 1 through stage 4 chronic kidney disease, or unspecified chronic kidney disease: Secondary | ICD-10-CM | POA: Diagnosis present

## 2012-08-15 DIAGNOSIS — G4733 Obstructive sleep apnea (adult) (pediatric): Secondary | ICD-10-CM | POA: Diagnosis present

## 2012-08-15 DIAGNOSIS — J9600 Acute respiratory failure, unspecified whether with hypoxia or hypercapnia: Secondary | ICD-10-CM | POA: Diagnosis present

## 2012-08-15 DIAGNOSIS — I509 Heart failure, unspecified: Secondary | ICD-10-CM | POA: Diagnosis present

## 2012-08-15 DIAGNOSIS — Z8673 Personal history of transient ischemic attack (TIA), and cerebral infarction without residual deficits: Secondary | ICD-10-CM

## 2012-08-15 DIAGNOSIS — I4891 Unspecified atrial fibrillation: Secondary | ICD-10-CM | POA: Diagnosis present

## 2012-08-15 DIAGNOSIS — N184 Chronic kidney disease, stage 4 (severe): Secondary | ICD-10-CM | POA: Diagnosis present

## 2012-08-15 DIAGNOSIS — Z87891 Personal history of nicotine dependence: Secondary | ICD-10-CM

## 2012-08-15 DIAGNOSIS — Z96659 Presence of unspecified artificial knee joint: Secondary | ICD-10-CM

## 2012-08-15 DIAGNOSIS — Y849 Medical procedure, unspecified as the cause of abnormal reaction of the patient, or of later complication, without mention of misadventure at the time of the procedure: Secondary | ICD-10-CM | POA: Diagnosis present

## 2012-08-15 DIAGNOSIS — I5031 Acute diastolic (congestive) heart failure: Secondary | ICD-10-CM | POA: Diagnosis present

## 2012-08-15 DIAGNOSIS — T380X5A Adverse effect of glucocorticoids and synthetic analogues, initial encounter: Secondary | ICD-10-CM | POA: Diagnosis not present

## 2012-08-15 DIAGNOSIS — D638 Anemia in other chronic diseases classified elsewhere: Secondary | ICD-10-CM | POA: Diagnosis present

## 2012-08-15 DIAGNOSIS — N183 Chronic kidney disease, stage 3 unspecified: Secondary | ICD-10-CM | POA: Diagnosis present

## 2012-08-15 DIAGNOSIS — R5381 Other malaise: Secondary | ICD-10-CM | POA: Diagnosis not present

## 2012-08-15 DIAGNOSIS — I5041 Acute combined systolic (congestive) and diastolic (congestive) heart failure: Secondary | ICD-10-CM

## 2012-08-15 DIAGNOSIS — Z7901 Long term (current) use of anticoagulants: Secondary | ICD-10-CM

## 2012-08-15 DIAGNOSIS — Z9981 Dependence on supplemental oxygen: Secondary | ICD-10-CM

## 2012-08-15 DIAGNOSIS — E875 Hyperkalemia: Secondary | ICD-10-CM | POA: Diagnosis not present

## 2012-08-15 DIAGNOSIS — I160 Hypertensive urgency: Secondary | ICD-10-CM | POA: Diagnosis present

## 2012-08-15 DIAGNOSIS — I10 Essential (primary) hypertension: Secondary | ICD-10-CM

## 2012-08-15 DIAGNOSIS — E1129 Type 2 diabetes mellitus with other diabetic kidney complication: Secondary | ICD-10-CM | POA: Diagnosis present

## 2012-08-15 DIAGNOSIS — E662 Morbid (severe) obesity with alveolar hypoventilation: Secondary | ICD-10-CM | POA: Diagnosis present

## 2012-08-15 DIAGNOSIS — T8450XA Infection and inflammatory reaction due to unspecified internal joint prosthesis, initial encounter: Secondary | ICD-10-CM | POA: Diagnosis present

## 2012-08-15 DIAGNOSIS — I5043 Acute on chronic combined systolic (congestive) and diastolic (congestive) heart failure: Principal | ICD-10-CM | POA: Diagnosis present

## 2012-08-15 DIAGNOSIS — E785 Hyperlipidemia, unspecified: Secondary | ICD-10-CM | POA: Diagnosis present

## 2012-08-15 DIAGNOSIS — IMO0001 Reserved for inherently not codable concepts without codable children: Secondary | ICD-10-CM | POA: Diagnosis present

## 2012-08-15 DIAGNOSIS — I498 Other specified cardiac arrhythmias: Secondary | ICD-10-CM | POA: Diagnosis not present

## 2012-08-15 DIAGNOSIS — Z794 Long term (current) use of insulin: Secondary | ICD-10-CM

## 2012-08-15 DIAGNOSIS — Z6841 Body Mass Index (BMI) 40.0 and over, adult: Secondary | ICD-10-CM

## 2012-08-15 DIAGNOSIS — J45901 Unspecified asthma with (acute) exacerbation: Secondary | ICD-10-CM | POA: Diagnosis present

## 2012-08-15 HISTORY — DX: Heart failure, unspecified: I50.9

## 2012-08-15 LAB — COMPREHENSIVE METABOLIC PANEL
Albumin: 2.8 g/dL — ABNORMAL LOW (ref 3.5–5.2)
BUN: 32 mg/dL — ABNORMAL HIGH (ref 6–23)
Calcium: 8.9 mg/dL (ref 8.4–10.5)
GFR calc Af Amer: 32 mL/min — ABNORMAL LOW (ref >90)
Glucose, Bld: 156 mg/dL — ABNORMAL HIGH (ref 70–99)
Sodium: 138 mEq/L (ref 135–145)
Total Protein: 7.3 g/dL (ref 6.0–8.3)

## 2012-08-15 LAB — CBC WITH DIFFERENTIAL/PLATELET
Basophils Absolute: 0 10*3/uL (ref 0.0–0.1)
Basophils Relative: 0 % (ref 0–1)
Eosinophils Relative: 2 % (ref 0–5)
HCT: 27.5 % — ABNORMAL LOW (ref 36.0–46.0)
Lymphocytes Relative: 10 % — ABNORMAL LOW (ref 12–46)
MCHC: 30.5 g/dL (ref 30.0–36.0)
Monocytes Absolute: 0.5 10*3/uL (ref 0.1–1.0)
Neutro Abs: 7.8 10*3/uL — ABNORMAL HIGH (ref 1.7–7.7)
Platelets: 268 10*3/uL (ref 150–400)
RDW: 18.4 % — ABNORMAL HIGH (ref 11.5–15.5)
WBC: 9.4 10*3/uL (ref 4.0–10.5)

## 2012-08-15 LAB — URINALYSIS, ROUTINE W REFLEX MICROSCOPIC
Leukocytes, UA: NEGATIVE
Nitrite: NEGATIVE
Specific Gravity, Urine: 1.015 (ref 1.005–1.030)
Urobilinogen, UA: 0.2 mg/dL (ref 0.0–1.0)
pH: 6 (ref 5.0–8.0)

## 2012-08-15 LAB — GLUCOSE, CAPILLARY
Glucose-Capillary: 151 mg/dL — ABNORMAL HIGH (ref 70–99)
Glucose-Capillary: 208 mg/dL — ABNORMAL HIGH (ref 70–99)

## 2012-08-15 LAB — POCT I-STAT TROPONIN I: Troponin i, poc: 0.01 ng/mL (ref 0.00–0.08)

## 2012-08-15 LAB — PRO B NATRIURETIC PEPTIDE: Pro B Natriuretic peptide (BNP): 1214 pg/mL — ABNORMAL HIGH (ref 0–125)

## 2012-08-15 LAB — DIGOXIN LEVEL: Digoxin Level: 0.9 ng/mL (ref 0.8–2.0)

## 2012-08-15 LAB — PROTIME-INR: Prothrombin Time: 23.8 seconds — ABNORMAL HIGH (ref 11.6–15.2)

## 2012-08-15 LAB — URINE MICROSCOPIC-ADD ON

## 2012-08-15 LAB — MRSA PCR SCREENING: MRSA by PCR: NEGATIVE

## 2012-08-15 MED ORDER — OXYCODONE-ACETAMINOPHEN 5-325 MG PO TABS
1.0000 | ORAL_TABLET | Freq: Three times a day (TID) | ORAL | Status: DC | PRN
  Administered 2012-08-15 – 2012-08-27 (×6): 1 via ORAL
  Filled 2012-08-15 (×6): qty 1

## 2012-08-15 MED ORDER — IRBESARTAN 150 MG PO TABS
150.0000 mg | ORAL_TABLET | Freq: Every day | ORAL | Status: DC
  Administered 2012-08-15 – 2012-08-28 (×14): 150 mg via ORAL
  Filled 2012-08-15 (×16): qty 1

## 2012-08-15 MED ORDER — WARFARIN SODIUM 6 MG PO TABS
6.0000 mg | ORAL_TABLET | Freq: Once | ORAL | Status: AC
  Administered 2012-08-15: 6 mg via ORAL
  Filled 2012-08-15: qty 1

## 2012-08-15 MED ORDER — INSULIN GLARGINE 100 UNIT/ML ~~LOC~~ SOLN
10.0000 [IU] | Freq: Every day | SUBCUTANEOUS | Status: DC
  Administered 2012-08-15 – 2012-08-17 (×3): 10 [IU] via SUBCUTANEOUS

## 2012-08-15 MED ORDER — ONDANSETRON HCL 4 MG/2ML IJ SOLN
4.0000 mg | Freq: Four times a day (QID) | INTRAMUSCULAR | Status: DC | PRN
  Administered 2012-08-15 – 2012-08-20 (×3): 4 mg via INTRAVENOUS
  Filled 2012-08-15 (×4): qty 2

## 2012-08-15 MED ORDER — INSULIN ASPART 100 UNIT/ML ~~LOC~~ SOLN: 0.0000 [IU] | Freq: Three times a day (TID) | SUBCUTANEOUS | Status: DC

## 2012-08-15 MED ORDER — WARFARIN - PHARMACIST DOSING INPATIENT: Freq: Every day | Status: DC

## 2012-08-15 MED ORDER — METHYLPREDNISOLONE SODIUM SUCC 125 MG IJ SOLR
60.0000 mg | Freq: Four times a day (QID) | INTRAMUSCULAR | Status: DC
  Administered 2012-08-15 – 2012-08-16 (×3): 60 mg via INTRAVENOUS
  Filled 2012-08-15 (×7): qty 0.96

## 2012-08-15 MED ORDER — FUROSEMIDE 10 MG/ML IJ SOLN
60.0000 mg | Freq: Four times a day (QID) | INTRAMUSCULAR | Status: DC
  Administered 2012-08-15 – 2012-08-22 (×27): 60 mg via INTRAVENOUS
  Filled 2012-08-15 (×35): qty 6

## 2012-08-15 MED ORDER — ZOLPIDEM TARTRATE 5 MG PO TABS
5.0000 mg | ORAL_TABLET | Freq: Every evening | ORAL | Status: DC | PRN
  Administered 2012-08-15 – 2012-08-29 (×10): 5 mg via ORAL
  Filled 2012-08-15 (×10): qty 1

## 2012-08-15 MED ORDER — HYDRALAZINE HCL 20 MG/ML IJ SOLN
5.0000 mg | Freq: Four times a day (QID) | INTRAMUSCULAR | Status: DC | PRN
  Administered 2012-08-15 – 2012-08-29 (×3): 5 mg via INTRAVENOUS
  Filled 2012-08-15 (×4): qty 1

## 2012-08-15 MED ORDER — SODIUM CHLORIDE 0.9 % IJ SOLN
3.0000 mL | Freq: Two times a day (BID) | INTRAMUSCULAR | Status: DC
  Administered 2012-08-15 – 2012-08-28 (×23): 3 mL via INTRAVENOUS

## 2012-08-15 MED ORDER — SPIRONOLACTONE 25 MG PO TABS
25.0000 mg | ORAL_TABLET | Freq: Every day | ORAL | Status: DC
  Administered 2012-08-15 – 2012-08-20 (×6): 25 mg via ORAL
  Filled 2012-08-15 (×7): qty 1

## 2012-08-15 MED ORDER — SODIUM CHLORIDE 0.9 % IV SOLN
250.0000 mL | INTRAVENOUS | Status: DC | PRN
  Administered 2012-08-15: 250 mL via INTRAVENOUS

## 2012-08-15 MED ORDER — ALBUTEROL SULFATE (5 MG/ML) 0.5% IN NEBU
2.5000 mg | INHALATION_SOLUTION | Freq: Four times a day (QID) | RESPIRATORY_TRACT | Status: DC
  Administered 2012-08-15 – 2012-08-16 (×2): 2.5 mg via RESPIRATORY_TRACT
  Filled 2012-08-15 (×2): qty 0.5

## 2012-08-15 MED ORDER — ALLOPURINOL 100 MG PO TABS
100.0000 mg | ORAL_TABLET | Freq: Every day | ORAL | Status: DC
  Administered 2012-08-15 – 2012-08-28 (×14): 100 mg via ORAL
  Filled 2012-08-15 (×15): qty 1

## 2012-08-15 MED ORDER — NITROGLYCERIN IN D5W 200-5 MCG/ML-% IV SOLN
10.0000 ug/min | INTRAVENOUS | Status: DC
  Administered 2012-08-15: 10 ug/min via INTRAVENOUS
  Filled 2012-08-15: qty 250

## 2012-08-15 MED ORDER — DIGOXIN 125 MCG PO TABS
125.0000 ug | ORAL_TABLET | Freq: Every day | ORAL | Status: DC
  Administered 2012-08-15 – 2012-08-25 (×8): 125 ug via ORAL
  Filled 2012-08-15 (×15): qty 1

## 2012-08-15 MED ORDER — ATORVASTATIN CALCIUM 20 MG PO TABS
20.0000 mg | ORAL_TABLET | Freq: Every day | ORAL | Status: DC
  Administered 2012-08-15 – 2012-08-28 (×15): 20 mg via ORAL
  Filled 2012-08-15 (×16): qty 1

## 2012-08-15 MED ORDER — NITROGLYCERIN 0.4 MG SL SUBL: 0.4000 mg | SUBLINGUAL_TABLET | SUBLINGUAL | Status: DC | PRN

## 2012-08-15 MED ORDER — ALBUTEROL SULFATE (5 MG/ML) 0.5% IN NEBU: 2.5000 mg | INHALATION_SOLUTION | RESPIRATORY_TRACT | Status: DC | PRN

## 2012-08-15 MED ORDER — SODIUM CHLORIDE 0.9 % IJ SOLN
3.0000 mL | INTRAMUSCULAR | Status: DC | PRN
  Administered 2012-08-16 – 2012-08-17 (×2): 3 mL via INTRAVENOUS

## 2012-08-15 MED ORDER — ASPIRIN 81 MG PO CHEW
81.0000 mg | CHEWABLE_TABLET | Freq: Every day | ORAL | Status: DC
  Administered 2012-08-15 – 2012-08-28 (×14): 81 mg via ORAL
  Filled 2012-08-15 (×15): qty 1

## 2012-08-15 MED ORDER — AMIODARONE HCL 200 MG PO TABS
200.0000 mg | ORAL_TABLET | Freq: Every day | ORAL | Status: DC
  Administered 2012-08-15 – 2012-08-28 (×14): 200 mg via ORAL
  Filled 2012-08-15 (×15): qty 1

## 2012-08-15 MED ORDER — ACETAMINOPHEN 325 MG PO TABS
650.0000 mg | ORAL_TABLET | ORAL | Status: DC | PRN
  Administered 2012-08-24: 650 mg via ORAL
  Filled 2012-08-15: qty 2

## 2012-08-15 MED ORDER — PANTOPRAZOLE SODIUM 40 MG PO TBEC
40.0000 mg | DELAYED_RELEASE_TABLET | Freq: Every day | ORAL | Status: DC
  Administered 2012-08-16 – 2012-08-28 (×13): 40 mg via ORAL
  Filled 2012-08-15 (×14): qty 1

## 2012-08-15 MED ORDER — FUROSEMIDE 10 MG/ML IJ SOLN
160.0000 mg | Freq: Once | INTRAVENOUS | Status: AC
  Administered 2012-08-15: 160 mg via INTRAVENOUS
  Filled 2012-08-15: qty 16

## 2012-08-15 NOTE — ED Provider Notes (Signed)
History     CSN: [id number]  Arrival date & time 08/15/12  3086   First MD Initiated Contact with Patient 08/15/12 [phone]      Chief Complaint  Patient presents with  . Shortness of Breath    (Consider location/radiation/quality/duration/timing/severity/associated sxs/prior treatment) HPI This 61 year old female has a history of atrial fibrillation, chronic anticoagulation, heart failure, asthma, sleep apnea, edema, ran out of her Benicar a couple days ago but restarted it yesterday, now presents with about 4 days of gradual onset worse than usual shortness of breath, worse orthopnea, stable edema, suspected weight gain but does not weigh herself at home. She is no fever or chest pain. She does have some nonproductive cough. She is no headache confusion vomiting or bloody stools. At baseline she is generally weak and feels a bit weaker over the last few days. She has used her inhaler with partial improvement. Her shortness of breath started out mildly and now it is moderate so she came to the ED due to gradually worsening shortness of breath the last few days. Past Medical History  Diagnosis Date  . Asthma   . Bronchitis   . Allergic rhinitis   . Diabetes mellitus   . Stroke   . HTN (hypertension)   . OSA (obstructive sleep apnea)   . DJD (degenerative joint disease)   . Kidney stones   . Depression   . Hyperlipidemia   . Complication of anesthesia     trouble waking up  . CHF (congestive heart failure)     ATRIAL FIBRILLATION WITH RAPID VENTRICULAR RESPONSE  .  Acute combined systolic and diastolic heart failure  .  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  BRONCHITIS, RECURRENT  .  ASTHMA  .  Obesity hypoventilation syndrome  .  Prosthetic joint infection  .  Group B streptococcal infection  .  Gout  .  DM (diabetes mellitus)  .  Chronic anticoagulation  .  [name]  Chronic venous insufficiency    Past Surgical History  Procedure Date  . Appendectomy   . Cholecystectomy   . Total  knee arthroplasty   . Total abdominal hysterectomy   . Back surgery   . Cystectomy     left hand    Family History  Problem Relation Age of Onset  . Heart attack Father   . Asthma Father   . Heart disease Paternal Uncle   . Rectal cancer Paternal Aunt   . Other Mother     mva    History  Substance Use Topics  . Smoking status: Former Smoker    Types: Cigarettes  . Smokeless tobacco: Never Used   Comment: smoked for 1.5 yrs about 2 cigs a day ,only if stressed  . Alcohol Use: No    OB History    Grav Para Term Preterm Abortions TAB SAB Ect Mult Living                  Review of Systems 10 Systems reviewed and are negative for acute change except as noted in the HPI. Allergies  Daptomycin; Morphine and related; Cephalexin; Celecoxib; Codeine; Fluoxetine hcl; Latex; Ofloxacin; Penicillins; Rofecoxib; and Sulfonamide derivatives  Home Medications   No current outpatient prescriptions on file.  BP 194/66  Pulse 73  Temp 98.1 F (36.7 C) (Oral)  Resp 22  Ht 5\' 3"  (1.6 m)  Wt 269 lb 11.2 oz (122.335 kg)  BMI 47.78 kg/m2  SpO2 97%  Physical Exam  Nursing note and  vitals reviewed. Constitutional:       Awake, alert, nontoxic appearance.  HENT:  Head: Atraumatic.  Eyes: Right eye exhibits no discharge. Left eye exhibits no discharge.  Neck: Neck supple.  Cardiovascular: Normal rate and regular rhythm.   No murmur heard.      Occasional PACs  Pulmonary/Chest: She is in respiratory distress. She has no wheezes. She has rales. She exhibits no tenderness.       Mild to moderate respiratory distress at rest with some tachypnea and increased work of breathing able to speak in phrases with bibasilar crackles without wheezing retractions or accessory muscle usage noted  Abdominal: Soft. Bowel sounds are normal. There is no tenderness. There is no rebound and no guarding.  Musculoskeletal: She exhibits edema. She exhibits no tenderness.       Baseline ROM, no obvious  new focal weakness. Baseline stasis dermatitis with moderate edema to her lower legs without cellulitis noted.  Neurological: She is alert.       Mental status and motor strength appears baseline for patient and situation.  Skin: No rash noted.  Psychiatric: She has a normal mood and affect.    ED Course  Procedures (including critical care time) ECG:  normal sinus rhythm, ventricular rate 62, normal axis normal intervals for lateral inverted T waves and nonspecific ST changes without significant change from February 2013  Patient voided in the emergency department but still short of breath even at rest with talking so [facility] paged for admission.[id number]  Patient / Family / Caregiver informed of clinical course, understand medical decision-making process, and agree with plan.  CRITICAL CARE Performed by: Hurman Horn   Total critical care time:  Critical care time was exclusive of separately billable procedures and treating other patients.  Critical care was necessary to treat or prevent imminent or life-threatening deterioration.  Critical care was time spent personally by me on the following activities: development of treatment plan with patient and/or surrogate as well as nursing, discussions with consultants, evaluation of patient's response to treatment, examination of patient, obtaining history from patient or surrogate, ordering and performing treatments and interventions, ordering and review of laboratory studies, ordering and review of radiographic studies, pulse oximetry and re-evaluation of patient's condition. Labs Reviewed  CBC WITH DIFFERENTIAL - Abnormal; Notable for the following:    RBC 3.39 (*)     Hemoglobin 8.4 (*)     HCT 27.5 (*)     MCH 24.8 (*)     RDW 18.4 (*)     Neutrophils Relative 83 (*)     Neutro Abs 7.8 (*)     Lymphocytes Relative 10 (*)     All other components within normal limits  PROTIME-INR - Abnormal; Notable for the following:     Prothrombin Time 23.8 (*)     INR 2.09 (*)     All other components within normal limits  COMPREHENSIVE METABOLIC PANEL - Abnormal; Notable for the following:    Glucose, Bld 156 (*)     BUN 32 (*)     Creatinine, Ser 1.90 (*)     Albumin 2.8 (*)     Total Bilirubin 0.2 (*)     GFR calc non Af Amer 27 (*)     GFR calc Af Amer 32 (*)     All other components within normal limits  PRO B NATRIURETIC PEPTIDE - Abnormal; Notable for the following:    Pro B Natriuretic peptide (BNP) 1214.0 (*)  All other components within normal limits  URINALYSIS, ROUTINE W REFLEX MICROSCOPIC - Abnormal; Notable for the following:    APPearance CLOUDY (*)     Glucose, UA 100 (*)     Hgb urine dipstick TRACE (*)     Protein, ur >300 (*)     All other components within normal limits  URINE MICROSCOPIC-ADD ON - Abnormal; Notable for the following:    Squamous Epithelial / LPF FEW (*)     Bacteria, UA FEW (*)     All other components within normal limits  GLUCOSE, CAPILLARY - Abnormal; Notable for the following:    Glucose-Capillary 151 (*)     All other components within normal limits  DIGOXIN LEVEL  POCT I-STAT TROPONIN I  MRSA PCR SCREENING  URINALYSIS, WITH MICROSCOPIC  BASIC METABOLIC PANEL  CBC  PROTIME-INR   Dg Chest 2 View  08/15/2012  *RADIOLOGY REPORT*  Clinical Data: Shortness of breath  CHEST - 2 VIEW  Comparison: Multiple priors, most recent 01/25/2012.  Findings: Moderate cardiomegaly is stable.  There is diffuse pulmonary vascular congestion.  Diffuse mild interstitial prominence bilaterally.  A small left pleural effusion is visible in the left costophrenic angle.  No acute bony abnormality.  IMPRESSION: Mild congestive heart failure pattern with small left pleural effusion.   Original Report Authenticated By: Britta Mccreedy, M.D.      1. Heart failure   2. Acute respiratory failure   3. Acute diastolic CHF (congestive heart failure)   4. Acute asthma exacerbation   5. Hypertensive  urgency       MDM          Hurman Horn, MD 08/15/12 2103

## 2012-08-15 NOTE — H&P (Signed)
History and Physical  AVNEET ASHMORE AVW:[id number] DOB: 1951-10-07 DOA: 08/15/2012  Referring physician: Wayland Salinas, MD PCP: Willey Blade, MD   Chief Complaint: wheezing  HPI:  61 year old woman with history of asthma, OSA/OHS on night-time oxygen, combined systolic/diastolic CHF presented to ED with 4 days of gradual onset increased SOB, wheezing (esp. at night), worsening orthopnea (2 pillow>>up on side of bed), cough. Denies fever. Reports compliance with all medications.  Review of chart notable for echo 01/2012 revealing grade 1 diastolic dysfunction, LVEF 50-55%; last admitted for respiratory issues 01/2012. Was treated with antibiotics, steroids, nebulizer.  In ED was noted to be afebrile, hypertensive. Was started on NTG infusion and Lasix. CXR--mild CHF. EKG no acute changes. Labs consistent with known co-morbidities. No weight yet. Referred for admission for CHF.  Review of Systems:  Negative for fever, visual changes, rash, new muscle aches, chest pain, dysuria, bleeding, nausea, vomiting, abdominal pain, diarrhea  Positive for sore throat.  Past Medical History  Diagnosis Date  . Asthma   . Bronchitis   . Allergic rhinitis   . Diabetes mellitus   . Stroke   . HTN (hypertension)   . OSA (obstructive sleep apnea)   . DJD (degenerative joint disease)   . Kidney stones   . Depression   . Hyperlipidemia   . Complication of anesthesia     trouble waking up  . CHF (congestive heart failure)     Past Surgical History  Procedure Date  . Appendectomy   . Cholecystectomy   . Total knee arthroplasty   . Total abdominal hysterectomy   . Back surgery   . Cystectomy     left hand    Social History:  reports that she has quit smoking. Her smoking use included Cigarettes. She has never used smokeless tobacco. She reports that she does not drink alcohol or use illicit drugs.  Allergies  Allergen Reactions  . Daptomycin Other (See Comments)    Elevated CPK  . Morphine  And Related Nausea And Vomiting  . Cephalexin Rash    unknown  . Celecoxib     unknown  . Codeine     unknown  . Fluoxetine Hcl     unknown  . Latex     REACTION: undefined  . Ofloxacin     unknown  . Penicillins     unknown  . Rofecoxib     unknown  . Sulfonamide Derivatives     unknown    Family History  Problem Relation Age of Onset  . Heart attack Father   . Asthma Father   . Heart disease Paternal Uncle   . Rectal cancer Paternal Aunt   . Other Mother     mva     Prior to Admission medications   Medication Sig Start Date End Date Taking? Authorizing Provider  albuterol (PROVENTIL HFA;VENTOLIN HFA) 108 (90 BASE) MCG/ACT inhaler Inhale 2 puffs into the lungs every 6 (six) hours as needed. For shortness of breath   Yes Historical Provider, MD  allopurinol (ZYLOPRIM) 100 MG tablet Take 1 tablet by mouth daily. 09/16/11  Yes Historical Provider, MD  amiodarone (PACERONE) 200 MG tablet Take 200 mg by mouth daily.   Yes Historical Provider, MD  aspirin 81 MG tablet Take 81 mg by mouth daily.    Yes Historical Provider, MD  atorvastatin (LIPITOR) 20 MG tablet Take 20 mg by mouth at bedtime.   Yes Historical Provider, MD  clobetasol (TEMOVATE) 0.05 % ointment Apply 1 application  topically daily. Use as directed on legs 04/29/11  Yes Historical Provider, MD  digoxin (LANOXIN) 0.125 MG tablet Take 1 tablet by mouth daily. 09/14/11  Yes Historical Provider, MD  furosemide (LASIX) 80 MG tablet Take 60 mg by mouth 2 (two) times daily.   Yes Historical Provider, MD  insulin aspart (NOVOLOG) 100 UNIT/ML injection Inject 0-20 Units into the skin 3 (three) times daily before meals.   Yes Historical Provider, MD  insulin glargine (LANTUS) 100 UNIT/ML injection Inject 10 Units into the skin at bedtime.   Yes Historical Provider, MD  olmesartan (BENICAR) 20 MG tablet Take 20 mg by mouth daily.   Yes Historical Provider, MD  pantoprazole (PROTONIX) 40 MG tablet Take 40 mg by mouth daily.    Yes Historical Provider, MD  spironolactone (ALDACTONE) 25 MG tablet Take 25 mg by mouth daily.     Yes Historical Provider, MD  warfarin (COUMADIN) 6 MG tablet Take 6 mg by mouth daily.   Yes Historical Provider, MD  zolpidem (AMBIEN) 5 MG tablet Take 5 mg by mouth at bedtime as needed. For sleep   Yes Historical Provider, MD   Physical Exam: Filed Vitals:   08/15/12 0830 08/15/12 0924 08/15/12 0925 08/15/12 1115  BP: 206/86 170/73 170/73 198/61  Pulse: 64 61 60   Temp: 98 F (36.7 C)     TempSrc: Oral     Resp: 26 19 17 20   SpO2: 98% 100% 100%      General:  Examined in ED. Appears calm, non-toxic, mild-moderate shortness of breath.  Eyes: PERRL, normal lids, irises, conjunctive  ENT: grossly normal hearing, lips & tongue  Neck: no LAD, masses or thyromegaly  Cardiovascular: RRR, no m/r/g. Difficult to assess LE for edema secondary to obesity, but certainly no massive edema  Respiratory: CTA bilaterally, decreased breath sounds, no frank w/r/r. Moderate increased work of breathing, able to speak in short, full sentences  Abdomen: soft, ntnd, obese  Skin: no rash or induration; chronic skin changes noted BLE  Musculoskeletal: grossly normal tone  Psychiatric: grossly normal mood and affect, speech fluent and appropriate  Neurologic: grossly non-focal  Wt Readings from Last 3 Encounters:  05/26/12 119.863 kg (264 lb 4 oz)  04/28/12 117.8 kg (259 lb 11.2 oz)  03/09/12 120.317 kg (265 lb 4 oz)   Labs on Admission:  Basic Metabolic Panel:  Lab 08/15/12 7829  NA 138  K 5.0  CL 104  CO2 23  GLUCOSE 156*  BUN 32*  CREATININE 1.90*  CALCIUM 8.9  MG --  PHOS --   Liver Function Tests:  Lab 08/15/12 0952  AST 10  ALT 8  ALKPHOS 106  BILITOT 0.2*  PROT 7.3  ALBUMIN 2.8*   CBC:  Lab 08/15/12 0952  WBC 9.4  NEUTROABS 7.8*  HGB 8.4*  HCT 27.5*  MCV 81.1  PLT 268    Basename 08/15/12 0958  TROPIPOC 0.01    Basename 08/15/12 0952 01/21/12 2330  01/21/12 1544  PROBNP 1214.0* 1741.0* 824.9*   Radiological Exams on Admission: Dg Chest 2 View  08/15/2012  *RADIOLOGY REPORT*  Clinical Data: Shortness of breath  CHEST - 2 VIEW  Comparison: Multiple priors, most recent 01/25/2012.  Findings: Moderate cardiomegaly is stable.  There is diffuse pulmonary vascular congestion.  Diffuse mild interstitial prominence bilaterally.  A small left pleural effusion is visible in the left costophrenic angle.  No acute bony abnormality.  IMPRESSION: Mild congestive heart failure pattern with small left pleural effusion.  Original Report Authenticated By: Britta Mccreedy, M.D.    EKG: Independently reviewed. SR, inferolateral T-wave inversion (old, seen 01/23/2012)   Principal Problem:  *Acute respiratory failure Active Problems:  OBSTRUCTIVE SLEEP APNEA  Obesity hypoventilation syndrome  DM (diabetes mellitus)  Acute diastolic CHF (congestive heart failure)  Acute asthma exacerbation  Hypertensive urgency  CKD (chronic kidney disease), stage III  Anemia of chronic disease   Assessment/Plan 1. Acute respiratory failure--multifactorial, history suggests acute asthma exacerbation and imaging CHF. Plan as below. 2. Acute asthma exacerbation--steroids, nebs, oxygen. 3. Acute CHF, diastolic--Lasix, daily weights, I/O. 4. Hypertensive urgency--improved with Lasix. Continue antihypertensives, monitor closely. 5. DM--continue Lantus. SSI. 6. Atrial fibrillation--currently in SR. Continue amiodarone, digoxin, warfarin, INR therapeutic. 7. Combined systolic/diastolic CHF by history--echo 03/7828 LVEF 50-55%, no WMA. Grade 1 diastolic dysfunction. Plan as above 8. OSA/OHS--CPAP QHS. 9. Osteomyelitis 10. CKD III-IV--appears stable. 11. Anemia of chronic disease--stable  Admitted to Dr. Diamantina Providence service (Dr. [name] confirmed notification). Dr. Sharyn Lull to assume care 9/8.  Code Status: Full code Family Communication: discussed with spouse at bedside Disposition  Plan: home when improved  Time spent: 62 minutes  Brendia Sacks, MD  [facility] Team 5 Pager [phone]. If 7PM-7AM, please contact night-coverage at [email or web address], password [facility] 08/15/2012, 2:38 PM

## 2012-08-15 NOTE — Progress Notes (Addendum)
Placed pt on nasal cpap for rest.  Pt placed on 7cm h2o per her home settings with 2l o2 bleedin.  Pt is tolerating well at this time.  Hr 74, sats 97%.  Rn aware.  Sterile water added to chamber for humidity.

## 2012-08-15 NOTE — Progress Notes (Signed)
ANTICOAGULATION CONSULT NOTE - Initial Consult  Pharmacy Consult for warfarin Indication: atrial fibrillation  Allergies  Allergen Reactions  . Daptomycin Other (See Comments)    Elevated CPK  . Morphine And Related Nausea And Vomiting  . Cephalexin Rash    unknown  . Celecoxib     unknown  . Codeine     unknown  . Fluoxetine Hcl     unknown  . Latex     REACTION: undefined  . Ofloxacin     unknown  . Penicillins     unknown  . Rofecoxib     unknown  . Sulfonamide Derivatives     unknown    Patient Measurements: Height: 5\' 3"  (160 cm) Weight: 269 lb 11.2 oz (122.335 kg) IBW/kg (Calculated) : 52.4   Vital Signs: Temp: 97.7 F (36.5 C) (09/07 1730) Temp src: Oral (09/07 1730) BP: 198/79 mmHg (09/07 1730) Pulse Rate: 69  (09/07 1730)  Labs:  [facility] 08/15/12 0952  HGB 8.4*  HCT 27.5*  PLT 268  APTT --  LABPROT 23.8*  INR 2.09*  HEPARINUNFRC --  CREATININE 1.90*  CKTOTAL --  CKMB --  TROPONINI --    Estimated Creatinine Clearance: 39 ml/min (by C-G formula based on Cr of 1.9).   Medical History: Past Medical History  Diagnosis Date  . Asthma   . Bronchitis   . Allergic rhinitis   . Diabetes mellitus   . Stroke   . HTN (hypertension)   . OSA (obstructive sleep apnea)   . DJD (degenerative joint disease)   . Kidney stones   . Depression   . Hyperlipidemia   . Complication of anesthesia     trouble waking up  . CHF (congestive heart failure)     Medications:  Scheduled:    . albuterol  2.5 mg Nebulization Q6H  . allopurinol  100 mg Oral Daily  . amiodarone  200 mg Oral Daily  . aspirin  81 mg Oral Daily  . atorvastatin  20 mg Oral QHS  . digoxin  125 mcg Oral Daily  . furosemide  160 mg Intravenous Once  . furosemide  60 mg Intravenous Q6H  . insulin aspart  0-15 Units Subcutaneous TID WC  . insulin glargine  10 Units Subcutaneous QHS  . irbesartan  150 mg Oral Daily  . methylPREDNISolone (SOLU-MEDROL) injection  60 mg  Intravenous Q6H  . pantoprazole  40 mg Oral Daily  . sodium chloride  3 mL Intravenous Q12H  . spironolactone  25 mg Oral Daily   Infusions:    . DISCONTD: nitroGLYCERIN Stopped (08/15/12 1540)    Assessment:  61 yo female admitted with CC wheezing to continue warfarin for hx afib per pharmacy dosing..  Patient was reportedly on 6mg  daily PTA with last dose on 9/6  INR therapeutic  Goal of Therapy:  INR 2-3   Plan:  Continue with home dose as above - 6mg  tonight Daily INR   Hessie Knows, PharmD, BCPS Pager [phone] 08/15/2012 5:56 PM

## 2012-08-15 NOTE — ED Notes (Signed)
Report called to Wendy, RN

## 2012-08-15 NOTE — ED Notes (Signed)
Pt w/ strong cardiac Hx w/ 4 days hx of shortness of breath, cough, but unable to get anything up. Pt states she fills like she is swelling and gaining weight.

## 2012-08-15 NOTE — ED Notes (Signed)
RN to obtain labs with start of IV 

## 2012-08-16 LAB — BASIC METABOLIC PANEL
BUN: 38 mg/dL — ABNORMAL HIGH (ref 6–23)
CO2: 25 mEq/L (ref 19–32)
Chloride: 100 mEq/L (ref 96–112)
Creatinine, Ser: 2.22 mg/dL — ABNORMAL HIGH (ref 0.50–1.10)
Potassium: 5.2 mEq/L — ABNORMAL HIGH (ref 3.5–5.1)

## 2012-08-16 LAB — PROTIME-INR
INR: 1.93 — ABNORMAL HIGH (ref 0.00–1.49)
Prothrombin Time: 22.4 seconds — ABNORMAL HIGH (ref 11.6–15.2)

## 2012-08-16 LAB — GLUCOSE, CAPILLARY
Glucose-Capillary: 300 mg/dL — ABNORMAL HIGH (ref 70–99)
Glucose-Capillary: 408 mg/dL — ABNORMAL HIGH (ref 70–99)

## 2012-08-16 LAB — CBC
HCT: 28.1 % — ABNORMAL LOW (ref 36.0–46.0)
Hemoglobin: 8.6 g/dL — ABNORMAL LOW (ref 12.0–15.0)
Hemoglobin: 8.7 g/dL — ABNORMAL LOW (ref 12.0–15.0)
MCH: 24.1 pg — ABNORMAL LOW (ref 26.0–34.0)
MCHC: 30 g/dL (ref 30.0–36.0)
MCV: 80.5 fL (ref 78.0–100.0)
Platelets: 314 10*3/uL (ref 150–400)
RDW: 18.1 % — ABNORMAL HIGH (ref 11.5–15.5)
RDW: 18.2 % — ABNORMAL HIGH (ref 11.5–15.5)
WBC: 10.4 10*3/uL (ref 4.0–10.5)

## 2012-08-16 MED ORDER — ALBUTEROL SULFATE (5 MG/ML) 0.5% IN NEBU: 2.5000 mg | INHALATION_SOLUTION | RESPIRATORY_TRACT | Status: DC | PRN

## 2012-08-16 MED ORDER — BIOTENE DRY MOUTH MT LIQD
15.0000 mL | Freq: Two times a day (BID) | OROMUCOSAL | Status: DC
  Administered 2012-08-16 – 2012-08-29 (×25): 15 mL via OROMUCOSAL

## 2012-08-16 MED ORDER — METHYLPREDNISOLONE SODIUM SUCC 40 MG IJ SOLR
40.0000 mg | Freq: Four times a day (QID) | INTRAMUSCULAR | Status: DC
  Administered 2012-08-16 – 2012-08-17 (×5): 40 mg via INTRAVENOUS
  Filled 2012-08-16 (×8): qty 1

## 2012-08-16 MED ORDER — INSULIN ASPART 100 UNIT/ML ~~LOC~~ SOLN
20.0000 [IU] | Freq: Once | SUBCUTANEOUS | Status: AC
  Administered 2012-08-16: 20 [IU] via SUBCUTANEOUS

## 2012-08-16 MED ORDER — INSULIN ASPART 100 UNIT/ML ~~LOC~~ SOLN
18.0000 [IU] | Freq: Once | SUBCUTANEOUS | Status: AC
  Administered 2012-08-16: 18 [IU] via SUBCUTANEOUS

## 2012-08-16 MED ORDER — INSULIN ASPART 100 UNIT/ML ~~LOC~~ SOLN
0.0000 [IU] | Freq: Three times a day (TID) | SUBCUTANEOUS | Status: DC
  Administered 2012-08-16: 8 [IU] via SUBCUTANEOUS
  Administered 2012-08-17 – 2012-08-18 (×6): 15 [IU] via SUBCUTANEOUS
  Administered 2012-08-19 (×3): 11 [IU] via SUBCUTANEOUS
  Administered 2012-08-20: 3 [IU] via SUBCUTANEOUS
  Administered 2012-08-20 – 2012-08-21 (×3): 5 [IU] via SUBCUTANEOUS
  Administered 2012-08-21: 11 [IU] via SUBCUTANEOUS
  Administered 2012-08-21: 3 [IU] via SUBCUTANEOUS
  Administered 2012-08-22: 5 [IU] via SUBCUTANEOUS
  Administered 2012-08-22: 8 [IU] via SUBCUTANEOUS
  Administered 2012-08-22 – 2012-08-23 (×3): 3 [IU] via SUBCUTANEOUS
  Administered 2012-08-23: 8 [IU] via SUBCUTANEOUS
  Administered 2012-08-24: 3 [IU] via SUBCUTANEOUS
  Administered 2012-08-24 – 2012-08-25 (×2): 5 [IU] via SUBCUTANEOUS
  Administered 2012-08-25 – 2012-08-26 (×3): 2 [IU] via SUBCUTANEOUS
  Administered 2012-08-26: 3 [IU] via SUBCUTANEOUS
  Administered 2012-08-26: 2 [IU] via SUBCUTANEOUS
  Administered 2012-08-27: 3 [IU] via SUBCUTANEOUS
  Administered 2012-08-27: 5 [IU] via SUBCUTANEOUS
  Administered 2012-08-27 – 2012-08-28 (×2): 2 [IU] via SUBCUTANEOUS
  Administered 2012-08-28: 3 [IU] via SUBCUTANEOUS
  Administered 2012-08-28 – 2012-08-29 (×2): 2 [IU] via SUBCUTANEOUS

## 2012-08-16 MED ORDER — WARFARIN SODIUM 7.5 MG PO TABS
7.5000 mg | ORAL_TABLET | Freq: Once | ORAL | Status: AC
  Administered 2012-08-16: 7.5 mg via ORAL
  Filled 2012-08-16: qty 1

## 2012-08-16 MED ORDER — ALBUTEROL SULFATE (5 MG/ML) 0.5% IN NEBU
2.5000 mg | INHALATION_SOLUTION | Freq: Three times a day (TID) | RESPIRATORY_TRACT | Status: DC
  Administered 2012-08-16 – 2012-08-21 (×18): 2.5 mg via RESPIRATORY_TRACT
  Filled 2012-08-16 (×18): qty 0.5

## 2012-08-16 NOTE — Progress Notes (Signed)
Pt had CBG of 436.  Dr Sharyn Lull called and returned page with new order for 20 Units of Novolog SQ.  Order noted and given.

## 2012-08-16 NOTE — Progress Notes (Signed)
ANTICOAGULATION CONSULT NOTE - Follow up  Pharmacy Consult for warfarin Indication: atrial fibrillation  Allergies  Allergen Reactions  . Daptomycin Other (See Comments)    Elevated CPK  . Morphine And Related Nausea And Vomiting  . Cephalexin Rash    unknown  . Celecoxib     unknown  . Codeine     unknown  . Fluoxetine Hcl     unknown  . Latex     REACTION: undefined  . Ofloxacin     unknown  . Penicillins     unknown  . Rofecoxib     unknown  . Sulfonamide Derivatives     unknown    Patient Measurements: Height: 5\' 3"  (160 cm) Weight: 265 lb 14.4 oz (120.611 kg) IBW/kg (Calculated) : 52.4   Vital Signs: Temp: 98.2 F (36.8 C) (09/08 0629) Temp src: Oral (09/08 0629) BP: 187/71 mmHg (09/08 0629) Pulse Rate: 71  (09/08 0629)  Labs:  Basename 08/16/12 0505 08/15/12 0952  HGB 8.6* 8.4*  HCT 28.1* 27.5*  PLT 312 268  APTT -- --  LABPROT 22.4* 23.8*  INR 1.93* 2.09*  HEPARINUNFRC -- --  CREATININE 2.22* 1.90*  CKTOTAL -- --  CKMB -- --  TROPONINI -- --    Estimated Creatinine Clearance: 33.1 ml/min (by C-G formula based on Cr of 2.22).   Medical History: Past Medical History  Diagnosis Date  . Asthma   . Bronchitis   . Allergic rhinitis   . Diabetes mellitus   . Stroke   . HTN (hypertension)   . OSA (obstructive sleep apnea)   . DJD (degenerative joint disease)   . Kidney stones   . Depression   . Hyperlipidemia   . Complication of anesthesia     trouble waking up  . CHF (congestive heart failure)     Medications:  Scheduled:     . albuterol  2.5 mg Nebulization TID  . allopurinol  100 mg Oral Daily  . amiodarone  200 mg Oral Daily  . antiseptic oral rinse  15 mL Mouth Rinse BID  . aspirin  81 mg Oral Daily  . atorvastatin  20 mg Oral QHS  . digoxin  125 mcg Oral Daily  . furosemide  160 mg Intravenous Once  . furosemide  60 mg Intravenous Q6H  . insulin aspart  0-15 Units Subcutaneous TID WC  . insulin glargine  10 Units  Subcutaneous QHS  . irbesartan  150 mg Oral Daily  . methylPREDNISolone (SOLU-MEDROL) injection  60 mg Intravenous Q6H  . pantoprazole  40 mg Oral Daily  . sodium chloride  3 mL Intravenous Q12H  . spironolactone  25 mg Oral Daily  . warfarin  6 mg Oral Once  . Warfarin - Pharmacist Dosing Inpatient   Does not apply q1800  . DISCONTD: albuterol  2.5 mg Nebulization Q6H   Infusions:     . DISCONTD: nitroGLYCERIN Stopped (08/15/12 1540)    Assessment:  61 yo female admitted with CC wheezing to continue warfarin for hx afib per pharmacy dosing..  Patient was reportedly on 6mg  daily PTA with last dose on 9/6  INR slightly below therapeutic range this am. 6mg  charted as given last night.  CBC stable, No bleeding reported/documented.  SCr and K+ rose overnight. Note patient is on spironolactone and ARB as PTA. Also on digoxin and amiodarone.   Goal of Therapy:  INR 2-3   Plan:   Give 7.5mg  today at noon to boost INR.  F/u daily.  Charolotte Eke, PharmD, pager [phone]. 08/16/2012,6:56 AM.

## 2012-08-16 NOTE — Progress Notes (Signed)
Pt had CBG of 408.  Dr Sharyn Lull called and returned page with new one time order for 18 units of Novolog. Order noted and given.

## 2012-08-16 NOTE — Progress Notes (Signed)
Subjective:  Patient denies any anginal chest pain states breathing has improved. Patient gives history of PND orthopnea leg swelling and wheezing.   Objective:  Vital Signs in the last 24 hours: Temp:  [97.7 F (36.5 C)-98.2 F (36.8 C)] 98.2 F (36.8 C) (09/08 0629) Pulse Rate:  [54-73] 71  (09/08 0629) Resp:  [10-27] 22  (09/08 0629) BP: (185-215)/(61-87) 187/71 mmHg (09/08 0629) SpO2:  [94 %-100 %] 98 % (09/08 0753) Weight:  [120.611 kg (265 lb 14.4 oz)-122.335 kg (269 lb 11.2 oz)] 120.611 kg (265 lb 14.4 oz) (09/08 0629)  Intake/Output from previous day: 09/07 0701 - 09/08 0700 In: 678.5 [P.O.:580; I.V.:78.5; IV Piggyback:20] Out: 3900 [Urine:3900] Intake/Output from this shift: Total I/O In: 240 [P.O.:240] Out: -   Physical Exam: Neck: no adenopathy, no carotid bruit, no JVD and supple, symmetrical, trachea midline Lungs: Decreased breath sound at bases with bibasilar Rales no wheezing noted Heart: regular rate and rhythm, S1, S2 normal and Soft systolic murmur and S3 gallop noted Abdomen: soft, non-tender; bowel sounds normal; no masses,  no organomegaly Extremities: No clubbing cyanosis 2+ edema noted  Lab Results:  Basename 08/16/12 0505 08/15/12 0952  WBC 10.4 9.4  HGB 8.6* 8.4*  PLT 312 268    Basename 08/16/12 0505 08/15/12 0952  NA 135 138  K 5.2* 5.0  CL 100 104  CO2 25 23  GLUCOSE 332* 156*  BUN 38* 32*  CREATININE 2.22* 1.90*   No results found for this basename: TROPONINI:2,CK,MB:2 in the last 72 hours Hepatic Function Panel  Basename 08/15/12 0952  PROT 7.3  ALBUMIN 2.8*  AST 10  ALT 8  ALKPHOS 106  BILITOT 0.2*  BILIDIR --  IBILI --   No results found for this basename: CHOL in the last 72 hours No results found for this basename: PROTIME in the last 72 hours  Imaging: Imaging results have been reviewed and Dg Chest 2 View  08/15/2012  *RADIOLOGY REPORT*  Clinical Data: Shortness of breath  CHEST - 2 VIEW  Comparison: Multiple  priors, most recent 01/25/2012.  Findings: Moderate cardiomegaly is stable.  There is diffuse pulmonary vascular congestion.  Diffuse mild interstitial prominence bilaterally.  A small left pleural effusion is visible in the left costophrenic angle.  No acute bony abnormality.  IMPRESSION: Mild congestive heart failure pattern with small left pleural effusion.   Original Report Authenticated By: Britta Mccreedy, M.D.     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  LOS: 1 day    Jerris Fleer N 08/16/2012, 9:27 AM

## 2012-08-17 LAB — RETICULOCYTES
Retic Count, Absolute: 90.2 10*3/uL (ref 19.0–186.0)
Retic Ct Pct: 2.6 % (ref 0.4–3.1)

## 2012-08-17 LAB — IRON AND TIBC
Iron: 46 ug/dL (ref 42–135)
UIBC: 258 ug/dL (ref 125–400)

## 2012-08-17 LAB — PROTIME-INR
INR: 2.26 — ABNORMAL HIGH (ref 0.00–1.49)
Prothrombin Time: 25.3 seconds — ABNORMAL HIGH (ref 11.6–15.2)

## 2012-08-17 LAB — GLUCOSE, CAPILLARY
Glucose-Capillary: 395 mg/dL — ABNORMAL HIGH (ref 70–99)
Glucose-Capillary: 421 mg/dL — ABNORMAL HIGH (ref 70–99)

## 2012-08-17 LAB — BASIC METABOLIC PANEL
Calcium: 8.6 mg/dL (ref 8.4–10.5)
Creatinine, Ser: 2.61 mg/dL — ABNORMAL HIGH (ref 0.50–1.10)
GFR calc Af Amer: 21 mL/min — ABNORMAL LOW (ref >90)
GFR calc non Af Amer: 19 mL/min — ABNORMAL LOW (ref >90)
Sodium: 134 mEq/L — ABNORMAL LOW (ref 135–145)

## 2012-08-17 LAB — FERRITIN: Ferritin: 102 ng/mL (ref 10–291)

## 2012-08-17 LAB — PRO B NATRIURETIC PEPTIDE: Pro B Natriuretic peptide (BNP): 2693 pg/mL — ABNORMAL HIGH (ref 0–125)

## 2012-08-17 MED ORDER — FLUCONAZOLE 150 MG PO TABS
150.0000 mg | ORAL_TABLET | Freq: Once | ORAL | Status: AC
  Administered 2012-08-17: 150 mg via ORAL
  Filled 2012-08-17: qty 1

## 2012-08-17 MED ORDER — WARFARIN SODIUM 6 MG PO TABS
6.0000 mg | ORAL_TABLET | Freq: Once | ORAL | Status: AC
  Administered 2012-08-17: 6 mg via ORAL
  Filled 2012-08-17: qty 1

## 2012-08-17 MED ORDER — INSULIN ASPART 100 UNIT/ML ~~LOC~~ SOLN
5.0000 [IU] | Freq: Once | SUBCUTANEOUS | Status: AC
  Administered 2012-08-17: 5 [IU] via SUBCUTANEOUS

## 2012-08-17 MED ORDER — INSULIN GLARGINE 100 UNIT/ML ~~LOC~~ SOLN
15.0000 [IU] | Freq: Two times a day (BID) | SUBCUTANEOUS | Status: DC
  Administered 2012-08-17 – 2012-08-18 (×2): 15 [IU] via SUBCUTANEOUS

## 2012-08-17 MED ORDER — INSULIN ASPART 100 UNIT/ML ~~LOC~~ SOLN
10.0000 [IU] | Freq: Once | SUBCUTANEOUS | Status: AC
  Administered 2012-08-17: 10 [IU] via INTRAVENOUS

## 2012-08-17 MED ORDER — METHYLPREDNISOLONE SODIUM SUCC 40 MG IJ SOLR
40.0000 mg | Freq: Two times a day (BID) | INTRAMUSCULAR | Status: DC
  Administered 2012-08-18 (×2): 40 mg via INTRAVENOUS
  Filled 2012-08-17 (×3): qty 1

## 2012-08-17 MED ORDER — LEVOFLOXACIN IN D5W 750 MG/150ML IV SOLN
750.0000 mg | INTRAVENOUS | Status: DC
  Administered 2012-08-17 – 2012-08-21 (×5): 750 mg via INTRAVENOUS
  Filled 2012-08-17 (×6): qty 150

## 2012-08-17 MED ORDER — INSULIN ASPART 100 UNIT/ML ~~LOC~~ SOLN: 10.0000 [IU] | Freq: Once | SUBCUTANEOUS | Status: DC

## 2012-08-17 NOTE — Progress Notes (Signed)
Inpatient Diabetes Program Recommendations  AACE/ADA: New Consensus Statement on Inpatient Glycemic Control (2013)  Target Ranges:  Prepandial:   less than 140 mg/dL      Peak postprandial:   less than 180 mg/dL (1-2 hours)      Critically ill patients:  140 - 180 mg/dL   Results for SYEDA, PRICKETT (MRN [id number]) as of 08/17/2012 11:34  Ref. Range 08/16/2012 07:07 08/16/2012 11:51 08/16/2012 16:49 08/16/2012 22:11  Glucose-Capillary Latest Range: 70-99 mg/dL 147 (H) 829 (H) 562 (H) 394 (H)   Results for RITAJ, DULLEA (MRN [id number]) as of 08/17/2012 11:34  Ref. Range 08/17/2012 07:20  Glucose-Capillary Latest Range: 70-99 mg/dL 696 (H)    Inpatient Diabetes Program Recommendations Insulin - Basal: Please increase Lantus to 20 units QHS. Correction (SSI): Add HS scale coverage to current hospital SSI regimen (only being covered at meal times) Insulin - Meal Coverage: Add meal coverage- Novolog 4 units tid with meals while on IV steroids.  Note: Will follow. Ambrose Finland RN, MSN, CDE Diabetes Coordinator Inpatient Diabetes Program [phone]

## 2012-08-17 NOTE — Progress Notes (Signed)
Placed pt on nasal cpap for rest, 7cm h2o per home settings, 2l o2 bleedin.  Pt is tolerating well at this time.  Hr 70, sats 98%.  Sterile water added to chamber for humidity.  Rn notified.

## 2012-08-17 NOTE — Progress Notes (Signed)
Subjective:  Hospital course is noted. Patient's breathing is improved with respiratory therapy and IV steroids. Her blood sugars have been exceedingly high most of the day. Patient denies chest pains. She still has some exertional dyspnea. Denies any vaginal itching or discharge. 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 She's not been on Levaquin since hospitalization. She also complains of excessive hunger at the present time since being hospitalized. She states her blood sugars at home have previously been no higher than 150 although this is questionable.   Allergies  Allergen Reactions  . Daptomycin Other (See Comments)    Elevated CPK  . Morphine And Related Nausea And Vomiting  . Cephalexin Rash    unknown  . Celecoxib     unknown  . Codeine     unknown  . Fluoxetine Hcl     unknown  . Latex     REACTION: undefined  . Ofloxacin     unknown  . Penicillins     unknown  . Rofecoxib     unknown  . Sulfonamide Derivatives     unknown   Current Facility-Administered Medications  Medication Dose Route Frequency Provider Last Rate Last Dose  . 0.9 %  sodium chloride infusion  250 mL Intravenous PRN Standley Brooking, MD 10 mL/hr at 08/15/12 2045 250 mL at 08/15/12 2045  . acetaminophen (TYLENOL) tablet 650 mg  650 mg Oral Q4H PRN Standley Brooking, MD      . albuterol (PROVENTIL) (5 MG/ML) 0.5% nebulizer solution 2.5 mg  2.5 mg Nebulization TID Gwenyth Bender, MD   2.5 mg at 08/17/12 2047  . albuterol (PROVENTIL) (5 MG/ML) 0.5% nebulizer solution 2.5 mg  2.5 mg Nebulization Q4H PRN Gwenyth Bender, MD      . allopurinol (ZYLOPRIM) tablet 100 mg  100 mg Oral Daily Standley Brooking, MD   100 mg at 08/17/12 1058  . amiodarone (PACERONE) tablet 200 mg  200 mg Oral Daily Standley Brooking, MD   200 mg at 08/17/12 1058  . antiseptic oral rinse (BIOTENE) solution 15 mL  15 mL Mouth Rinse BID Gwenyth Bender, MD   15 mL at  08/17/12 0814  . aspirin chewable tablet 81 mg  81 mg Oral Daily Standley Brooking, MD   81 mg at 08/17/12 1059  . atorvastatin (LIPITOR) tablet 20 mg  20 mg Oral QHS Standley Brooking, MD   20 mg at 08/16/12 2218  . digoxin (LANOXIN) tablet 125 mcg  125 mcg Oral Daily Standley Brooking, MD   125 mcg at 08/17/12 1058  . fluconazole (DIFLUCAN) tablet 150 mg  150 mg Oral Once Gwenyth Bender, MD      . furosemide (LASIX) injection 60 mg  60 mg Intravenous Q6H Standley Brooking, MD   60 mg at 08/17/12 1850  . hydrALAZINE (APRESOLINE) injection 5 mg  5 mg Intravenous Q6H PRN Standley Brooking, MD   5 mg at 08/16/12 1505  . insulin aspart (novoLOG) injection 0-15 Units  0-15 Units Subcutaneous TID WC Standley Brooking, MD   15 Units at 08/17/12 1705  . insulin aspart (novoLOG) injection 10 Units  10 Units Intravenous Once Gwenyth Bender, MD      . insulin aspart (novoLOG) injection 5 Units  5 Units Subcutaneous Once Gwenyth Bender, MD   5 Units at 08/17/12 1705  . insulin glargine (LANTUS) injection 10  Units  10 Units Subcutaneous QHS Standley Brooking, MD   10 Units at 08/16/12 2155  . insulin glargine (LANTUS) injection 15 Units  15 Units Subcutaneous BID Gwenyth Bender, MD      . irbesartan (AVAPRO) tablet 150 mg  150 mg Oral Daily Standley Brooking, MD   150 mg at 08/17/12 1058  . levofloxacin (LEVAQUIN) IVPB 750 mg  750 mg Intravenous Q24H Gwenyth Bender, MD      . methylPREDNISolone sodium succinate (SOLU-MEDROL) 40 mg/mL injection 40 mg  40 mg Intravenous Q12H Gwenyth Bender, MD      . ondansetron [facility]) injection 4 mg  4 mg Intravenous Q6H PRN Standley Brooking, MD   4 mg at 08/16/12 2150  . oxyCODONE-acetaminophen (PERCOCET/ROXICET) 5-325 MG per tablet 1 tablet  1 tablet Oral Q8H PRN Robynn Pane, MD   1 tablet at 08/16/12 2150  . pantoprazole (PROTONIX) EC tablet 40 mg  40 mg Oral Daily Standley Brooking, MD   40 mg at 08/17/12 1059  . sodium chloride 0.9 % injection 3 mL  3 mL Intravenous Q12H Standley Brooking, MD   3 mL at 08/17/12 1000  . sodium chloride 0.9 % injection 3 mL  3 mL Intravenous PRN Standley Brooking, MD   3 mL at 08/17/12 0232  . spironolactone (ALDACTONE) tablet 25 mg  25 mg Oral Daily Standley Brooking, MD   25 mg at 08/17/12 1058  . warfarin (COUMADIN) tablet 6 mg  6 mg Oral ONCE-1800 Annia Belt, PHARMD   6 mg at 08/17/12 1709  . Warfarin - Pharmacist Dosing Inpatient   Does not apply q1800 Berkley Harvey, PHARMD      . zolpidem [facility]) tablet 5 mg  5 mg Oral QHS PRN Standley Brooking, MD   5 mg at 08/16/12 2220  . DISCONTD: insulin aspart (novoLOG) injection 10 Units  10 Units Subcutaneous Once Gwenyth Bender, MD      . DISCONTD: methylPREDNISolone sodium succinate (SOLU-MEDROL) 40 mg/mL injection 40 mg  40 mg Intravenous Q6H Robynn Pane, MD   40 mg at 08/17/12 1314    Objective: Blood pressure 166/57, pulse 63, temperature 97.2 F (36.2 C), temperature source Oral, resp. rate 21, height 5\' 3"  (1.6 m), weight 260 lb 8 oz (118.162 kg), SpO2 98.00%.  Well-developed obese black female presently in no acute distress. HEENT: No sinus tenderness. No sclera icterus. NECK: No enlarged thyroid. No posterior cervical nodes. LUNGS: Distant breath sounds without wheezes. No vocal fremitus. No CVA tenderness. CV: Normal S1, S2 without S3. No ectopy. ABD: Obese, nontender. MSK: Negative Homans bilaterally. NEURO: Nonfocal.  SKIN: Chronic psoriatic changes of lower extremities. Chronic nonpitting edema.  Lab results: Results for orders placed during the hospital encounter of 08/15/12 (from the past 48 hour(s))  BASIC METABOLIC PANEL     Status: Abnormal   Collection Time   08/16/12  5:05 AM      Component Value Range Comment   Sodium 135  135 - 145 mEq/L    Potassium 5.2 (*) 3.5 - 5.1 mEq/L    Chloride 100  96 - 112 mEq/L    CO2 25  19 - 32 mEq/L    Glucose, Bld 332 (*) 70 - 99 mg/dL    BUN 38 (*) 6 - 23 mg/dL    Creatinine, Ser 9.56 (*) 0.50 - 1.10 mg/dL     Calcium 9.1  8.4 - 10.5 mg/dL  GFR calc non Af Amer 23 (*) >90 mL/min    GFR calc Af Amer 26 (*) >90 mL/min   CBC     Status: Abnormal   Collection Time   08/16/12  5:05 AM      Component Value Range Comment   WBC 10.4  4.0 - 10.5 K/uL    RBC 3.49 (*) 3.87 - 5.11 MIL/uL    Hemoglobin 8.6 (*) 12.0 - 15.0 g/dL    HCT 09.8 (*) 11.9 - 46.0 %    MCV 80.5  78.0 - 100.0 fL    MCH 24.6 (*) 26.0 - 34.0 pg    MCHC 30.6  30.0 - 36.0 g/dL    RDW 14.7 (*) 82.9 - 15.5 %    Platelets 312  150 - 400 K/uL   PROTIME-INR     Status: Abnormal   Collection Time   08/16/12  5:05 AM      Component Value Range Comment   Prothrombin Time 22.4 (*) 11.6 - 15.2 seconds    INR 1.93 (*) 0.00 - 1.49   GLUCOSE, CAPILLARY     Status: Abnormal   Collection Time   08/16/12  7:07 AM      Component Value Range Comment   Glucose-Capillary 300 (*) 70 - 99 mg/dL   CBC     Status: Abnormal   Collection Time   08/16/12 10:43 AM      Component Value Range Comment   WBC 10.5  4.0 - 10.5 K/uL    RBC 3.61 (*) 3.87 - 5.11 MIL/uL    Hemoglobin 8.7 (*) 12.0 - 15.0 g/dL    HCT 56.2 (*) 13.0 - 46.0 %    MCV 80.3  78.0 - 100.0 fL    MCH 24.1 (*) 26.0 - 34.0 pg    MCHC 30.0  30.0 - 36.0 g/dL    RDW 86.5 (*) 78.4 - 15.5 %    Platelets 314  150 - 400 K/uL   GLUCOSE, CAPILLARY     Status: Abnormal   Collection Time   08/16/12 11:51 AM      Component Value Range Comment   Glucose-Capillary 408 (*) 70 - 99 mg/dL   GLUCOSE, CAPILLARY     Status: Abnormal   Collection Time   08/16/12  4:49 PM      Component Value Range Comment   Glucose-Capillary 436 (*) 70 - 99 mg/dL   GLUCOSE, CAPILLARY     Status: Abnormal   Collection Time   08/16/12 10:11 PM      Component Value Range Comment   Glucose-Capillary 394 (*) 70 - 99 mg/dL   BASIC METABOLIC PANEL     Status: Abnormal   Collection Time   08/17/12  4:15 AM      Component Value Range Comment   Sodium 134 (*) 135 - 145 mEq/L    Potassium 5.3 (*) 3.5 - 5.1 mEq/L    Chloride 95 (*)  96 - 112 mEq/L    CO2 26  19 - 32 mEq/L    Glucose, Bld 414 (*) 70 - 99 mg/dL    BUN 54 (*) 6 - 23 mg/dL    Creatinine, Ser 6.96 (*) 0.50 - 1.10 mg/dL    Calcium 8.6  8.4 - 29.5 mg/dL    GFR calc non Af Amer 19 (*) >90 mL/min    GFR calc Af Amer 21 (*) >90 mL/min   PROTIME-INR     Status: Abnormal   Collection Time  08/17/12  4:15 AM      Component Value Range Comment   Prothrombin Time 25.3 (*) 11.6 - 15.2 seconds    INR 2.26 (*) 0.00 - 1.49   VITAMIN B12     Status: Normal   Collection Time   08/17/12  4:15 AM      Component Value Range Comment   Vitamin B-12 214  211 - 911 pg/mL   FOLATE     Status: Normal   Collection Time   08/17/12  4:15 AM      Component Value Range Comment   Folate 5.0     IRON AND TIBC     Status: Abnormal   Collection Time   08/17/12  4:15 AM      Component Value Range Comment   Iron 46  42 - 135 ug/dL    TIBC 478  295 - 621 ug/dL    Saturation Ratios 15 (*) 20 - 55 %    UIBC 258  125 - 400 ug/dL   FERRITIN     Status: Normal   Collection Time   08/17/12  4:15 AM      Component Value Range Comment   Ferritin 102  10 - 291 ng/mL   RETICULOCYTES     Status: Abnormal   Collection Time   08/17/12  4:15 AM      Component Value Range Comment   Retic Ct Pct 2.6  0.4 - 3.1 %    RBC. 3.47 (*) 3.87 - 5.11 MIL/uL    Retic Count, Manual 90.2  19.0 - 186.0 K/uL   PRO B NATRIURETIC PEPTIDE     Status: Abnormal   Collection Time   08/17/12  4:15 AM      Component Value Range Comment   Pro B Natriuretic peptide (BNP) 2693.0 (*) 0 - 125 pg/mL   GLUCOSE, CAPILLARY     Status: Abnormal   Collection Time   08/17/12  7:20 AM      Component Value Range Comment   Glucose-Capillary 366 (*) 70 - 99 mg/dL   GLUCOSE, CAPILLARY     Status: Abnormal   Collection Time   08/17/12 11:47 AM      Component Value Range Comment   Glucose-Capillary 395 (*) 70 - 99 mg/dL   GLUCOSE, CAPILLARY     Status: Abnormal   Collection Time   08/17/12  4:36 PM      Component Value Range Comment     Glucose-Capillary 421 (*) 70 - 99 mg/dL   GLUCOSE, RANDOM     Status: Abnormal   Collection Time   08/17/12  5:53 PM      Component Value Range Comment   Glucose, Bld 443 (*) 70 - 99 mg/dL   GLUCOSE, CAPILLARY     Status: Abnormal   Collection Time   08/17/12  7:06 PM      Component Value Range Comment   Glucose-Capillary 439 (*) 70 - 99 mg/dL   GLUCOSE, CAPILLARY     Status: Abnormal   Collection Time   08/17/12  8:59 PM      Component Value Range Comment   Glucose-Capillary 470 (*) 70 - 99 mg/dL     Studies/Results: No results found.  Patient Active Problem List  Diagnosis  . HYPERLIPIDEMIA  . OBSTRUCTIVE SLEEP APNEA  . HYPERTENSION  . ATRIAL FIBRILLATION WITH RAPID VENTRICULAR RESPONSE  . Acute combined systolic and diastolic heart failure  . 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 BRONCHITIS,  RECURRENT  . ASTHMA  . Obesity hypoventilation syndrome  . Prosthetic joint infection  . Group B streptococcal infection  . Gout  . Yeast infection involving the vagina and surrounding area  . SBO (small bowel obstruction)  . DM (diabetes mellitus)  . Chronic anticoagulation  . Oral candidiasis  . Laceration of leg  . Chronic venous insufficiency  . Acute respiratory failure  . Acute diastolic CHF (congestive heart failure)  . Acute asthma exacerbation  . Hypertensive urgency  . CKD (chronic kidney disease), stage III  . Anemia of chronic disease    Impression: Acute asthma improved. Uncontrolled diabetes mellitus exacerbated by steroids and possibly other. Chronic left knee infection secondary osteomyelitis. Rule out exacerbation of steroids. Acute on chronic congestive heart failure partially compensated. Elevated pro BNP. History of atrial fibrillation with rapid ventricular response. Presently on Coumadin. Obstructive sleep apnea. Hypertension. Morbid obesity. Chronic kidney disease. Anemia of chronic disease. Possibly symptomatic. Chronic prosthetic joint infection left  knee.   Plan: Restart IV Levaquin. Diflucan empiric x1. NovoLog IV daily for elevated blood sugars. Adjust Lantus insulin. Taper off of IV Solu-Medrol. Followup CMET CBC in a.m.   [name], Katalyn Matin 08/17/2012 9:46 PM

## 2012-08-17 NOTE — Progress Notes (Signed)
Checked with patient X3 attempts for nocturnal CPAP placement. She insists that she will notify her nurse when she is ready or sleep and in need of assistance. VSS at this time. Patient remains in chair on supplemental O2 via nasal cannula.

## 2012-08-17 NOTE — Progress Notes (Signed)
ANTICOAGULATION CONSULT NOTE - Follow up  Pharmacy Consult for warfarin Indication: atrial fibrillation  Allergies  Allergen Reactions  . Daptomycin Other (See Comments)    Elevated CPK  . Morphine And Related Nausea And Vomiting  . Cephalexin Rash    unknown  . Celecoxib     unknown  . Codeine     unknown  . Fluoxetine Hcl     unknown  . Latex     REACTION: undefined  . Ofloxacin     unknown  . Penicillins     unknown  . Rofecoxib     unknown  . Sulfonamide Derivatives     unknown    Patient Measurements: Height: 5\' 3"  (160 cm) Weight: 260 lb 8 oz (118.162 kg) (standing) IBW/kg (Calculated) : 52.4   Vital Signs: Temp: 97.4 F (36.3 C) (09/09 0648) Temp src: Oral (09/09 0648) BP: 170/70 mmHg (09/09 0648) Pulse Rate: 69  (09/09 0648)  Labs:  Basename 08/17/12 0415 08/16/12 1043 08/16/12 0505 08/15/12 0952  HGB -- 8.7* 8.6* --  HCT -- 29.0* 28.1* 27.5*  PLT -- 314 312 268  APTT -- -- -- --  LABPROT 25.3* -- 22.4* 23.8*  INR 2.26* -- 1.93* 2.09*  HEPARINUNFRC -- -- -- --  CREATININE 2.61* -- 2.22* 1.90*  CKTOTAL -- -- -- --  CKMB -- -- -- --  TROPONINI -- -- -- --    Estimated Creatinine Clearance: 27.8 ml/min (by C-G formula based on Cr of 2.61).   Medical History: Past Medical History  Diagnosis Date  . Asthma   . Bronchitis   . Allergic rhinitis   . Diabetes mellitus   . Stroke   . HTN (hypertension)   . OSA (obstructive sleep apnea)   . DJD (degenerative joint disease)   . Kidney stones   . Depression   . Hyperlipidemia   . Complication of anesthesia     trouble waking up  . CHF (congestive heart failure)     Medications:  Scheduled:     . albuterol  2.5 mg Nebulization TID  . allopurinol  100 mg Oral Daily  . amiodarone  200 mg Oral Daily  . antiseptic oral rinse  15 mL Mouth Rinse BID  . aspirin  81 mg Oral Daily  . atorvastatin  20 mg Oral QHS  . digoxin  125 mcg Oral Daily  . furosemide  60 mg Intravenous Q6H  . insulin  aspart  0-15 Units Subcutaneous TID WC  . insulin aspart  18 Units Subcutaneous Once  . insulin aspart  20 Units Subcutaneous Once  . insulin glargine  10 Units Subcutaneous QHS  . irbesartan  150 mg Oral Daily  . methylPREDNISolone (SOLU-MEDROL) injection  40 mg Intravenous Q6H  . pantoprazole  40 mg Oral Daily  . sodium chloride  3 mL Intravenous Q12H  . spironolactone  25 mg Oral Daily  . warfarin  7.5 mg Oral Once  . Warfarin - Pharmacist Dosing Inpatient   Does not apply q1800   Infusions:     Assessment:  61 yo female admitted with CC wheezing to continue warfarin for hx afib per pharmacy dosing..  Patient was reportedly on 6mg  daily PTA with last dose on 9/6  INR now back in therapeutic range.  CBC stable, No bleeding reported/documented.  SCr and K+ continue to rise. Note patient is on spironolactone and ARB as PTA. Also on digoxin and amiodarone.   Goal of Therapy:  INR 2-3   Plan:  Warfarin 6mg  PO x1 at 18:00  F/u daily.  Darrol Angel, PharmD Pager: [phone]  08/17/2012,10:15 AM.

## 2012-08-17 NOTE — Care Management Note (Unsigned)
    Page 1 of 2   08/27/2012     12:23:02 PM   CARE MANAGEMENT NOTE 08/27/2012  Patient:  [name], [name]   Account Number:  [id number]  Date Initiated:  08/17/2012  Documentation initiated by:  Lanier Clam  Subjective/Objective Assessment:   chf     Action/Plan:   from home   Anticipated DC Date:  08/27/2012   Anticipated DC Plan:  HOME W HOME HEALTH SERVICES      DC Planning Services  CM consult      Choice offered to / List presented to:  C-1 Patient           Status of service:  In process, will continue to follow Medicare Important Message given?   (If response is "NO", the following Medicare IM given date fields will be blank) Date Medicare IM given:   Date Additional Medicare IM given:    Discharge Disposition:    Per UR Regulation:  Reviewed for med. necessity/level of care/duration of stay  If discussed at Long Length of Stay Meetings, dates discussed:   08/25/2012  08/27/2012    Comments:  08/27/12 [name] RN,BSN NCM [phone] IMPROVING, ADJUSTING INSULIN.[facility] FOLLOWING FOR HHRN/PT/OT.WILL NEED ORDERS PUT IN.  08/24/12 [name] RN,BSN NCM [phone] [facility] FOLLOWING FOR HHRN.PATIENT WANT HOME.AWAIT PT/OT RECOMMENDATIONS.  08/19/12 [name] RN,BSN NCM [phone] IMPROVING.[facility] FOLLOWING FOR HHRN IF MD AGREE,PLEASE ORDER.  08/17/12 [name] RN,BSN NCM [phone] [facility])FOLLOWING FOR HH.IF MD AGREE PLEASE ORDER HHRN-CHF PROTOCAL.

## 2012-08-18 LAB — COMPREHENSIVE METABOLIC PANEL
Alkaline Phosphatase: 92 U/L (ref 39–117)
BUN: 82 mg/dL — ABNORMAL HIGH (ref 6–23)
CO2: 26 mEq/L (ref 19–32)
Calcium: 8.1 mg/dL — ABNORMAL LOW (ref 8.4–10.5)
GFR calc Af Amer: 20 mL/min — ABNORMAL LOW (ref >90)
GFR calc non Af Amer: 17 mL/min — ABNORMAL LOW (ref >90)
Glucose, Bld: 367 mg/dL — ABNORMAL HIGH (ref 70–99)
Potassium: 5.1 mEq/L (ref 3.5–5.1)
Total Protein: 7.1 g/dL (ref 6.0–8.3)

## 2012-08-18 LAB — PROTIME-INR
INR: 2.83 — ABNORMAL HIGH (ref 0.00–1.49)
Prothrombin Time: 30.2 seconds — ABNORMAL HIGH (ref 11.6–15.2)

## 2012-08-18 LAB — CBC WITH DIFFERENTIAL/PLATELET
Basophils Absolute: 0 10*3/uL (ref 0.0–0.1)
Eosinophils Absolute: 0 10*3/uL (ref 0.0–0.7)
HCT: 29.5 % — ABNORMAL LOW (ref 36.0–46.0)
Lymphocytes Relative: 4 % — ABNORMAL LOW (ref 12–46)
Monocytes Relative: 3 % (ref 3–12)
Neutro Abs: 12.4 10*3/uL — ABNORMAL HIGH (ref 1.7–7.7)
Neutrophils Relative %: 93 % — ABNORMAL HIGH (ref 43–77)
Platelets: 278 10*3/uL (ref 150–400)
RDW: 17.8 % — ABNORMAL HIGH (ref 11.5–15.5)
WBC: 13.3 10*3/uL — ABNORMAL HIGH (ref 4.0–10.5)

## 2012-08-18 LAB — GLUCOSE, CAPILLARY
Glucose-Capillary: 352 mg/dL — ABNORMAL HIGH (ref 70–99)
Glucose-Capillary: 364 mg/dL — ABNORMAL HIGH (ref 70–99)
Glucose-Capillary: 414 mg/dL — ABNORMAL HIGH (ref 70–99)
Glucose-Capillary: 429 mg/dL — ABNORMAL HIGH (ref 70–99)

## 2012-08-18 MED ORDER — PREDNISONE (PAK) 5 MG PO TABS
5.0000 mg | ORAL_TABLET | Freq: Four times a day (QID) | ORAL | Status: AC
  Administered 2012-08-20 – 2012-08-23 (×10): 5 mg via ORAL
  Filled 2012-08-18: qty 21

## 2012-08-18 MED ORDER — PREDNISONE (PAK) 5 MG PO TABS: 10.0000 mg | ORAL_TABLET | Freq: Every evening | ORAL | Status: DC

## 2012-08-18 MED ORDER — PREDNISONE (PAK) 5 MG PO TABS
10.0000 mg | ORAL_TABLET | Freq: Every morning | ORAL | Status: AC
  Administered 2012-08-18: 10 mg via ORAL
  Filled 2012-08-18: qty 21

## 2012-08-18 MED ORDER — INSULIN ASPART 100 UNIT/ML IV SOLN
10.0000 [IU] | Freq: Once | INTRAVENOUS | Status: AC
  Administered 2012-08-18: 10 [IU] via INTRAVENOUS

## 2012-08-18 MED ORDER — PREDNISONE (PAK) 5 MG PO TABS: 5.0000 mg | ORAL_TABLET | ORAL | Status: DC

## 2012-08-18 MED ORDER — WARFARIN SODIUM 4 MG PO TABS
4.0000 mg | ORAL_TABLET | Freq: Once | ORAL | Status: AC
  Administered 2012-08-18: 4 mg via ORAL
  Filled 2012-08-18: qty 1

## 2012-08-18 MED ORDER — INSULIN GLARGINE 100 UNIT/ML ~~LOC~~ SOLN
20.0000 [IU] | Freq: Two times a day (BID) | SUBCUTANEOUS | Status: DC
  Administered 2012-08-18 – 2012-08-20 (×4): 20 [IU] via SUBCUTANEOUS

## 2012-08-18 MED ORDER — PREDNISONE (PAK) 5 MG PO TABS
5.0000 mg | ORAL_TABLET | Freq: Three times a day (TID) | ORAL | Status: AC
  Administered 2012-08-19 (×3): 5 mg via ORAL

## 2012-08-18 NOTE — Progress Notes (Signed)
Subjective:  Patient reports she's feelilng better today. Notes less dyspnea on exertion. Denies any wheezing or chest pain. Her blood sugars have remained elevated today but not as  High as yesterday. Patient ambulated in hallway.   Allergies  Allergen Reactions  . Daptomycin Other (See Comments)    Elevated CPK  . Morphine And Related Nausea And Vomiting  . Cephalexin Rash    unknown  . Celecoxib     unknown  . Codeine     unknown  . Fluoxetine Hcl     unknown  . Latex     REACTION: undefined  . Ofloxacin     unknown  . Penicillins     unknown  . Rofecoxib     unknown  . Sulfonamide Derivatives     unknown   Current Facility-Administered Medications  Medication Dose Route Frequency Provider Last Rate Last Dose  . 0.9 %  sodium chloride infusion  250 mL Intravenous PRN Standley Brooking, MD 10 mL/hr at 08/15/12 2045 250 mL at 08/15/12 2045  . acetaminophen (TYLENOL) tablet 650 mg  650 mg Oral Q4H PRN Standley Brooking, MD      . albuterol (PROVENTIL) (5 MG/ML) 0.5% nebulizer solution 2.5 mg  2.5 mg Nebulization TID Gwenyth Bender, MD   2.5 mg at 08/18/12 2134  . albuterol (PROVENTIL) (5 MG/ML) 0.5% nebulizer solution 2.5 mg  2.5 mg Nebulization Q4H PRN Gwenyth Bender, MD      . allopurinol (ZYLOPRIM) tablet 100 mg  100 mg Oral Daily Standley Brooking, MD   100 mg at 08/18/12 0957  . amiodarone (PACERONE) tablet 200 mg  200 mg Oral Daily Standley Brooking, MD   200 mg at 08/18/12 0957  . antiseptic oral rinse (BIOTENE) solution 15 mL  15 mL Mouth Rinse BID Gwenyth Bender, MD   15 mL at 08/18/12 2000  . aspirin chewable tablet 81 mg  81 mg Oral Daily Standley Brooking, MD   81 mg at 08/18/12 0957  . atorvastatin (LIPITOR) tablet 20 mg  20 mg Oral QHS Standley Brooking, MD   20 mg at 08/17/12 2255  . digoxin (LANOXIN) tablet 125 mcg  125 mcg Oral Daily Standley Brooking, MD   125 mcg at 08/18/12 0957  . fluconazole (DIFLUCAN) tablet 150 mg  150 mg Oral Once Gwenyth Bender, MD   150 mg at  08/17/12 2255  . furosemide (LASIX) injection 60 mg  60 mg Intravenous Q6H Standley Brooking, MD   60 mg at 08/18/12 1734  . hydrALAZINE (APRESOLINE) injection 5 mg  5 mg Intravenous Q6H PRN Standley Brooking, MD   5 mg at 08/16/12 1505  . insulin aspart (novoLOG) injection 0-15 Units  0-15 Units Subcutaneous TID WC Standley Brooking, MD   15 Units at 08/18/12 1721  . insulin aspart (novoLOG) injection 10 Units  10 Units Intravenous Once Gwenyth Bender, MD   10 Units at 08/17/12 2255  . insulin aspart (novoLOG) injection 10 Units  10 Units Intravenous Once Gwenyth Bender, MD   10 Units at 08/18/12 0115  . insulin aspart (novoLOG) injection 10 Units  10 Units Intravenous Once Gwenyth Bender, MD   10 Units at 08/18/12 0415  . insulin glargine (LANTUS) injection 20 Units  20 Units Subcutaneous BID Gwenyth Bender, MD      . irbesartan (AVAPRO) tablet 150 mg  150 mg Oral Daily Standley Brooking, MD  150 mg at 08/18/12 0957  . levofloxacin (LEVAQUIN) IVPB 750 mg  750 mg Intravenous Q24H Gwenyth Bender, MD   750 mg at 08/17/12 2255  . ondansetron (ZOFRAN) injection 4 mg  4 mg Intravenous Q6H PRN Standley Brooking, MD   4 mg at 08/16/12 2150  . oxyCODONE-acetaminophen (PERCOCET/ROXICET) 5-325 MG per tablet 1 tablet  1 tablet Oral Q8H PRN Robynn Pane, MD   1 tablet at 08/17/12 2301  . pantoprazole (PROTONIX) EC tablet 40 mg  40 mg Oral Daily Standley Brooking, MD   40 mg at 08/18/12 0957  . predniSONE (STERAPRED UNI-PAK) tablet 10 mg  10 mg Oral AC breakfast Gwenyth Bender, MD      . predniSONE (STERAPRED UNI-PAK) tablet 10 mg  10 mg Oral Nightly Gwenyth Bender, MD      . predniSONE (STERAPRED UNI-PAK) tablet 10 mg  10 mg Oral Nightly Gwenyth Bender, MD      . predniSONE (STERAPRED UNI-PAK) tablet 5 mg  5 mg Oral PC lunch Gwenyth Bender, MD      . predniSONE (STERAPRED UNI-PAK) tablet 5 mg  5 mg Oral PC supper Gwenyth Bender, MD      . predniSONE (STERAPRED UNI-PAK) tablet 5 mg  5 mg Oral 3 x daily with food Gwenyth Bender, MD      .  predniSONE (STERAPRED UNI-PAK) tablet 5 mg  5 mg Oral 4X daily taper Gwenyth Bender, MD      . sodium chloride 0.9 % injection 3 mL  3 mL Intravenous Q12H Standley Brooking, MD   3 mL at 08/18/12 1002  . sodium chloride 0.9 % injection 3 mL  3 mL Intravenous PRN Standley Brooking, MD   3 mL at 08/17/12 0232  . spironolactone (ALDACTONE) tablet 25 mg  25 mg Oral Daily Standley Brooking, MD   25 mg at 08/18/12 0957  . warfarin (COUMADIN) tablet 4 mg  4 mg Oral ONCE-1800 Annia Belt, PHARMD   4 mg at 08/18/12 1720  . Warfarin - Pharmacist Dosing Inpatient   Does not apply q1800 Berkley Harvey, PHARMD      . zolpidem [facility]) tablet 5 mg  5 mg Oral QHS PRN Standley Brooking, MD   5 mg at 08/17/12 2301  . DISCONTD: insulin aspart (novoLOG) injection 10 Units  10 Units Subcutaneous Once Gwenyth Bender, MD      . DISCONTD: insulin glargine (LANTUS) injection 10 Units  10 Units Subcutaneous QHS Standley Brooking, MD   10 Units at 08/17/12 2255  . DISCONTD: insulin glargine (LANTUS) injection 15 Units  15 Units Subcutaneous BID Gwenyth Bender, MD   15 Units at 08/18/12 [phone]  . DISCONTD: methylPREDNISolone sodium succinate (SOLU-MEDROL) 40 mg/mL injection 40 mg  40 mg Intravenous Q12H Gwenyth Bender, MD   40 mg at 08/18/12 1228    Objective: Blood pressure 152/63, pulse 58, temperature 98.1 F (36.7 C), temperature source Oral, resp. rate 19, height 5\' 3"  (1.6 m), weight 261 lb 3.9 oz (118.5 kg), SpO2 97.00%. Well-developed obese black female resting comfortably in no acute distress. HEENT: No sinus tenderness. NECK: No enlarged thyroid. LUNGS: Clear without vocal fremitus. No wheezes. CV: Normal S1, S2 without S3. ABD: Soft nontender. MSK: Trace pitting edema. Negative Homans. NEURO: Nonfocal.  Lab results: Results for orders placed during the hospital encounter of 08/15/12 (from the past 48 hour(s))  GLUCOSE, CAPILLARY  Status: Abnormal   Collection Time   08/16/12 10:11 PM      Component  Value Range Comment   Glucose-Capillary 394 (*) 70 - 99 mg/dL   BASIC METABOLIC PANEL     Status: Abnormal   Collection Time   08/17/12  4:15 AM      Component Value Range Comment   Sodium 134 (*) 135 - 145 mEq/L    Potassium 5.3 (*) 3.5 - 5.1 mEq/L    Chloride 95 (*) 96 - 112 mEq/L    CO2 26  19 - 32 mEq/L    Glucose, Bld 414 (*) 70 - 99 mg/dL    BUN 54 (*) 6 - 23 mg/dL    Creatinine, Ser 1.61 (*) 0.50 - 1.10 mg/dL    Calcium 8.6  8.4 - 09.6 mg/dL    GFR calc non Af Amer 19 (*) >90 mL/min    GFR calc Af Amer 21 (*) >90 mL/min   PROTIME-INR     Status: Abnormal   Collection Time   08/17/12  4:15 AM      Component Value Range Comment   Prothrombin Time 25.3 (*) 11.6 - 15.2 seconds    INR 2.26 (*) 0.00 - 1.49   VITAMIN B12     Status: Normal   Collection Time   08/17/12  4:15 AM      Component Value Range Comment   Vitamin B-12 214  211 - 911 pg/mL   FOLATE     Status: Normal   Collection Time   08/17/12  4:15 AM      Component Value Range Comment   Folate 5.0     IRON AND TIBC     Status: Abnormal   Collection Time   08/17/12  4:15 AM      Component Value Range Comment   Iron 46  42 - 135 ug/dL    TIBC 045  409 - 811 ug/dL    Saturation Ratios 15 (*) 20 - 55 %    UIBC 258  125 - 400 ug/dL   FERRITIN     Status: Normal   Collection Time   08/17/12  4:15 AM      Component Value Range Comment   Ferritin 102  10 - 291 ng/mL   RETICULOCYTES     Status: Abnormal   Collection Time   08/17/12  4:15 AM      Component Value Range Comment   Retic Ct Pct 2.6  0.4 - 3.1 %    RBC. 3.47 (*) 3.87 - 5.11 MIL/uL    Retic Count, Manual 90.2  19.0 - 186.0 K/uL   PRO B NATRIURETIC PEPTIDE     Status: Abnormal   Collection Time   08/17/12  4:15 AM      Component Value Range Comment   Pro B Natriuretic peptide (BNP) 2693.0 (*) 0 - 125 pg/mL   GLUCOSE, CAPILLARY     Status: Abnormal   Collection Time   08/17/12  7:20 AM      Component Value Range Comment   Glucose-Capillary 366 (*) 70 - 99 mg/dL     GLUCOSE, CAPILLARY     Status: Abnormal   Collection Time   08/17/12 11:47 AM      Component Value Range Comment   Glucose-Capillary 395 (*) 70 - 99 mg/dL   GLUCOSE, CAPILLARY     Status: Abnormal   Collection Time   08/17/12  4:36 PM      Component Value  Range Comment   Glucose-Capillary 421 (*) 70 - 99 mg/dL   GLUCOSE, RANDOM     Status: Abnormal   Collection Time   08/17/12  5:53 PM      Component Value Range Comment   Glucose, Bld 443 (*) 70 - 99 mg/dL   GLUCOSE, CAPILLARY     Status: Abnormal   Collection Time   08/17/12  7:06 PM      Component Value Range Comment   Glucose-Capillary 439 (*) 70 - 99 mg/dL   GLUCOSE, CAPILLARY     Status: Abnormal   Collection Time   08/17/12  8:59 PM      Component Value Range Comment   Glucose-Capillary 470 (*) 70 - 99 mg/dL   GLUCOSE, CAPILLARY     Status: Abnormal   Collection Time   08/18/12 12:26 AM      Component Value Range Comment   Glucose-Capillary 429 (*) 70 - 99 mg/dL    Comment 1 Notify RN      Comment 2 Documented in Chart     GLUCOSE, CAPILLARY     Status: Abnormal   Collection Time   08/18/12  3:36 AM      Component Value Range Comment   Glucose-Capillary 447 (*) 70 - 99 mg/dL    Comment 1 Notify RN      Comment 2 Documented in Chart     GLUCOSE, CAPILLARY     Status: Abnormal   Collection Time   08/18/12  4:55 AM      Component Value Range Comment   Glucose-Capillary 392 (*) 70 - 99 mg/dL    Comment 1 Notify RN      Comment 2 Documented in Chart     PROTIME-INR     Status: Abnormal   Collection Time   08/18/12  6:40 AM      Component Value Range Comment   Prothrombin Time 30.2 (*) 11.6 - 15.2 seconds    INR 2.83 (*) 0.00 - 1.49   COMPREHENSIVE METABOLIC PANEL     Status: Abnormal   Collection Time   08/18/12  6:40 AM      Component Value Range Comment   Sodium 132 (*) 135 - 145 mEq/L    Potassium 5.1  3.5 - 5.1 mEq/L    Chloride 94 (*) 96 - 112 mEq/L    CO2 26  19 - 32 mEq/L    Glucose, Bld 367 (*) 70 - 99 mg/dL     BUN 82 (*) 6 - 23 mg/dL    Creatinine, Ser 1.61 (*) 0.50 - 1.10 mg/dL    Calcium 8.1 (*) 8.4 - 10.5 mg/dL    Total Protein 7.1  6.0 - 8.3 g/dL    Albumin 2.7 (*) 3.5 - 5.2 g/dL    AST 6  0 - 37 U/L    ALT 7  0 - 35 U/L    Alkaline Phosphatase 92  39 - 117 U/L    Total Bilirubin 0.1 (*) 0.3 - 1.2 mg/dL    GFR calc non Af Amer 17 (*) >90 mL/min    GFR calc Af Amer 20 (*) >90 mL/min   CBC WITH DIFFERENTIAL     Status: Abnormal   Collection Time   08/18/12  6:40 AM      Component Value Range Comment   WBC 13.3 (*) 4.0 - 10.5 K/uL    RBC 3.68 (*) 3.87 - 5.11 MIL/uL    Hemoglobin 8.9 (*) 12.0 - 15.0 g/dL  HCT 29.5 (*) 36.0 - 46.0 %    MCV 80.2  78.0 - 100.0 fL    MCH 24.2 (*) 26.0 - 34.0 pg    MCHC 30.2  30.0 - 36.0 g/dL    RDW 65.7 (*) 84.6 - 15.5 %    Platelets 278  150 - 400 K/uL    Neutrophils Relative 93 (*) 43 - 77 %    Lymphocytes Relative 4 (*) 12 - 46 %    Monocytes Relative 3  3 - 12 %    Eosinophils Relative 0  0 - 5 %    Basophils Relative 0  0 - 1 %    Neutro Abs 12.4 (*) 1.7 - 7.7 K/uL    Lymphs Abs 0.5 (*) 0.7 - 4.0 K/uL    Monocytes Absolute 0.4  0.1 - 1.0 K/uL    Eosinophils Absolute 0.0  0.0 - 0.7 K/uL    Basophils Absolute 0.0  0.0 - 0.1 K/uL    RBC Morphology RARE NRBCs      WBC Morphology MILD LEFT SHIFT (1-5% METAS, OCC MYELO, OCC BANDS)     GLUCOSE, CAPILLARY     Status: Abnormal   Collection Time   08/18/12  8:00 AM      Component Value Range Comment   Glucose-Capillary 364 (*) 70 - 99 mg/dL    Comment 1 Notify RN      Comment 2 Documented in Chart     GLUCOSE, CAPILLARY     Status: Abnormal   Collection Time   08/18/12 11:55 AM      Component Value Range Comment   Glucose-Capillary 402 (*) 70 - 99 mg/dL    Comment 1 Notify RN      Comment 2 Documented in Chart     GLUCOSE, CAPILLARY     Status: Abnormal   Collection Time   08/18/12  4:35 PM      Component Value Range Comment   Glucose-Capillary 352 (*) 70 - 99 mg/dL    Comment 1 Notify RN       Comment 2 Documented in Chart       Studies/Results: No results found.  Patient Active Problem List  Diagnosis  . HYPERLIPIDEMIA  . OBSTRUCTIVE SLEEP APNEA  . HYPERTENSION  . ATRIAL FIBRILLATION WITH RAPID VENTRICULAR RESPONSE  . Acute combined systolic and diastolic heart failure  . 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 BRONCHITIS, RECURRENT  . ASTHMA  . Obesity hypoventilation syndrome  . Prosthetic joint infection  . Group B streptococcal infection  . Gout  . Yeast infection involving the vagina and surrounding area  . SBO (small bowel obstruction)  . DM (diabetes mellitus)  . Chronic anticoagulation  . Oral candidiasis  . Laceration of leg  . Chronic venous insufficiency  . Acute respiratory failure  . Acute diastolic CHF (congestive heart failure)  . Acute asthma exacerbation  . Hypertensive urgency  . CKD (chronic kidney disease), stage III  . Anemia of chronic disease    Impression:  Acute asthma presently improved. Diabetes mellitus exacerbated by steroids and distress. Chronic infection left knee. Levaquin 3 started. Acute on chronic congestive heart failure. Morbid obesity with sleep apnea. Chronic kidney disease. Anemia of chronic disease.   Plan: Adjust her Lantus insulin further. DC IV Solu-Medrol. Start prednisone Dosepak, low-dose. Start EPO injections for her anemia. Guaiac stools x3. Followup CMET and CBC.   [name], Roper Tolson 08/18/2012 9:36 PM

## 2012-08-18 NOTE — Progress Notes (Signed)
ANTICOAGULATION CONSULT NOTE - Follow up  Pharmacy Consult for warfarin Indication: atrial fibrillation  Allergies  Allergen Reactions  . Daptomycin Other (See Comments)    Elevated CPK  . Morphine And Related Nausea And Vomiting  . Cephalexin Rash    unknown  . Celecoxib     unknown  . Codeine     unknown  . Fluoxetine Hcl     unknown  . Latex     REACTION: undefined  . Ofloxacin     unknown  . Penicillins     unknown  . Rofecoxib     unknown  . Sulfonamide Derivatives     unknown    Patient Measurements: Height: 5\' 3"  (160 cm) Weight: 261 lb 3.9 oz (118.5 kg) (standing scale) IBW/kg (Calculated) : 52.4   Vital Signs: Temp: 97.6 F (36.4 C) (09/10 0501) Temp src: Oral (09/10 0501) BP: 163/63 mmHg (09/10 0501) Pulse Rate: 63  (09/10 0957)  Labs:  Basename 08/18/12 0640 08/17/12 0415 08/16/12 1043 08/16/12 0505  HGB 8.9* -- 8.7* --  HCT 29.5* -- 29.0* 28.1*  PLT 278 -- 314 312  APTT -- -- -- --  LABPROT 30.2* 25.3* -- 22.4*  INR 2.83* 2.26* -- 1.93*  HEPARINUNFRC -- -- -- --  CREATININE 2.77* 2.61* -- 2.22*  CKTOTAL -- -- -- --  CKMB -- -- -- --  TROPONINI -- -- -- --    Estimated Creatinine Clearance: 26.2 ml/min (by C-G formula based on Cr of 2.77).   Medical History: Past Medical History  Diagnosis Date  . Asthma   . Bronchitis   . Allergic rhinitis   . Diabetes mellitus   . Stroke   . HTN (hypertension)   . OSA (obstructive sleep apnea)   . DJD (degenerative joint disease)   . Kidney stones   . Depression   . Hyperlipidemia   . Complication of anesthesia     trouble waking up  . CHF (congestive heart failure)     Medications:  Scheduled:     . albuterol  2.5 mg Nebulization TID  . allopurinol  100 mg Oral Daily  . amiodarone  200 mg Oral Daily  . antiseptic oral rinse  15 mL Mouth Rinse BID  . aspirin  81 mg Oral Daily  . atorvastatin  20 mg Oral QHS  . digoxin  125 mcg Oral Daily  . fluconazole  150 mg Oral Once  .  furosemide  60 mg Intravenous Q6H  . insulin aspart  0-15 Units Subcutaneous TID WC  . insulin aspart  10 Units Intravenous Once  . insulin aspart  10 Units Intravenous Once  . insulin aspart  10 Units Intravenous Once  . insulin aspart  5 Units Subcutaneous Once  . insulin glargine  10 Units Subcutaneous QHS  . insulin glargine  15 Units Subcutaneous BID  . irbesartan  150 mg Oral Daily  . levofloxacin (LEVAQUIN) IV  750 mg Intravenous Q24H  . methylPREDNISolone (SOLU-MEDROL) injection  40 mg Intravenous Q12H  . pantoprazole  40 mg Oral Daily  . sodium chloride  3 mL Intravenous Q12H  . spironolactone  25 mg Oral Daily  . warfarin  6 mg Oral ONCE-1800  . Warfarin - Pharmacist Dosing Inpatient   Does not apply q1800  . DISCONTD: insulin aspart  10 Units Subcutaneous Once  . DISCONTD: methylPREDNISolone (SOLU-MEDROL) injection  40 mg Intravenous Q6H   Infusions:     Assessment:  61 yo female admitted with CC wheezing  to continue warfarin for hx afib per pharmacy dosing..  Patient was reportedly on 6mg  daily PTA with last dose on 9/6  INR remains in therapeutic range, but continues to rise. - Will decrease dose tonight to slow INR.  CBC stable, No bleeding reported/documented.  SCr and K+ continue to rise. Note patient is on spironolactone and ARB as PTA. Also on digoxin and amiodarone.   Drug interaction with Levaquin.  Goal of Therapy:  INR 2-3   Plan:   Warfarin 4mg  PO x1 at 18:00  F/u daily.  Darrol Angel, PharmD Pager: [phone]  08/18/2012,1:02 PM.

## 2012-08-19 LAB — CBC WITH DIFFERENTIAL/PLATELET
Basophils Relative: 0 % (ref 0–1)
Eosinophils Relative: 0 % (ref 0–5)
HCT: 28.5 % — ABNORMAL LOW (ref 36.0–46.0)
Hemoglobin: 8.5 g/dL — ABNORMAL LOW (ref 12.0–15.0)
Lymphocytes Relative: 3 % — ABNORMAL LOW (ref 12–46)
Lymphs Abs: 0.3 10*3/uL — ABNORMAL LOW (ref 0.7–4.0)
MCV: 80.1 fL (ref 78.0–100.0)
Monocytes Relative: 5 % (ref 3–12)
Neutro Abs: 10.2 10*3/uL — ABNORMAL HIGH (ref 1.7–7.7)
RBC: 3.56 MIL/uL — ABNORMAL LOW (ref 3.87–5.11)
WBC: 11.1 10*3/uL — ABNORMAL HIGH (ref 4.0–10.5)

## 2012-08-19 LAB — PROTIME-INR: Prothrombin Time: 31.1 seconds — ABNORMAL HIGH (ref 11.6–15.2)

## 2012-08-19 LAB — COMPREHENSIVE METABOLIC PANEL
Albumin: 2.7 g/dL — ABNORMAL LOW (ref 3.5–5.2)
Alkaline Phosphatase: 82 U/L (ref 39–117)
BUN: 100 mg/dL — ABNORMAL HIGH (ref 6–23)
Calcium: 8 mg/dL — ABNORMAL LOW (ref 8.4–10.5)
Creatinine, Ser: 3.08 mg/dL — ABNORMAL HIGH (ref 0.50–1.10)
Potassium: 5.2 mEq/L — ABNORMAL HIGH (ref 3.5–5.1)
Total Protein: 6.7 g/dL (ref 6.0–8.3)

## 2012-08-19 LAB — GLUCOSE, CAPILLARY
Glucose-Capillary: 303 mg/dL — ABNORMAL HIGH (ref 70–99)
Glucose-Capillary: 333 mg/dL — ABNORMAL HIGH (ref 70–99)
Glucose-Capillary: 382 mg/dL — ABNORMAL HIGH (ref 70–99)

## 2012-08-19 MED ORDER — WARFARIN SODIUM 2.5 MG PO TABS
2.5000 mg | ORAL_TABLET | Freq: Once | ORAL | Status: AC
  Administered 2012-08-19: 2.5 mg via ORAL
  Filled 2012-08-19: qty 1

## 2012-08-19 NOTE — Progress Notes (Addendum)
Placed pt on CPAP via nasal mask, per home settings of 7.0 cm H20. Distilled water added to humidity chamber.  Pt. Tolerating well at this time. RN aware.

## 2012-08-19 NOTE — Progress Notes (Signed)
ANTICOAGULATION CONSULT NOTE - Follow up  Pharmacy Consult for warfarin Indication: atrial fibrillation  Allergies  Allergen Reactions  . Daptomycin Other (See Comments)    Elevated CPK  . Morphine And Related Nausea And Vomiting  . Cephalexin Rash    unknown  . Celecoxib     unknown  . Codeine     unknown  . Fluoxetine Hcl     unknown  . Latex     REACTION: undefined  . Ofloxacin     unknown  . Penicillins     unknown  . Rofecoxib     unknown  . Sulfonamide Derivatives     unknown   Patient Measurements: Height: 5\' 3"  (160 cm) Weight: 260 lb 5.8 oz (118.1 kg) (standing scale) IBW/kg (Calculated) : 52.4   Vital Signs: Temp: 98.1 F (36.7 C) (09/11 0612) Temp src: Oral (09/11 0612) BP: 155/75 mmHg (09/11 0612) Pulse Rate: 56  (09/11 0612)  Labs:  Basename 08/19/12 0438 08/18/12 0640 08/17/12 0415 08/16/12 1043  HGB 8.5* 8.9* -- --  HCT 28.5* 29.5* -- 29.0*  PLT 265 278 -- 314  APTT -- -- -- --  LABPROT 31.1* 30.2* 25.3* --  INR 2.94* 2.83* 2.26* --  HEPARINUNFRC -- -- -- --  CREATININE 3.08* 2.77* 2.61* --  CKTOTAL -- -- -- --  CKMB -- -- -- --  TROPONINI -- -- -- --   Estimated Creatinine Clearance: 23.5 ml/min (by C-G formula based on Cr of 3.08).  Assessment:  61 yo female admitted with CC wheezing to continue warfarin for hx afib per pharmacy dosing.  Patient was reportedly on 6mg  daily PTA with last dose on 9/6.    INR has been therapeutic, today 2.94  H/H low but stable.  No bleeding reported/documented.  SCr and K+ continue to rise. Note patient is on spironolactone and ARB as PTA. Also on digoxin and amiodarone.   DDI with Levaquin noted - will monitor  Goal of Therapy:  INR 2-3   Plan:   Coumadin 2.5mg  po x 1 tonight  Daily PT/INR  MD:  Please consider decrease Levaquin to 750 mg q48h per renal function.  Also consider holding spironolactone and irbesartan in the setting of hyperkalemia.  Geoffry Paradise, PharmD, BCPS Pager:  [phone] 10:28 AM Pharmacy #: [phone]

## 2012-08-19 NOTE — Progress Notes (Signed)
Subjective:  Patient reports her breathing continues to improves. Denies any wheezing. Has not ambulated much. She was noted to be impacted today and was disimpacted per nursing. She's feeling much better thereafter. She denies chest pains or palpitations. Blood sugars are slowly decreasing. Her appetite is improving.   Allergies  Allergen Reactions  . Daptomycin Other (See Comments)    Elevated CPK  . Morphine And Related Nausea And Vomiting  . Cephalexin Rash    unknown  . Celecoxib     unknown  . Codeine     unknown  . Fluoxetine Hcl     unknown  . Latex     REACTION: undefined  . Ofloxacin     unknown  . Penicillins     unknown  . Rofecoxib     unknown  . Sulfonamide Derivatives     unknown   Current Facility-Administered Medications  Medication Dose Route Frequency Provider Last Rate Last Dose  . 0.9 %  sodium chloride infusion  250 mL Intravenous PRN Standley Brooking, MD 10 mL/hr at 08/15/12 2045 250 mL at 08/15/12 2045  . acetaminophen (TYLENOL) tablet 650 mg  650 mg Oral Q4H PRN Standley Brooking, MD      . albuterol (PROVENTIL) (5 MG/ML) 0.5% nebulizer solution 2.5 mg  2.5 mg Nebulization TID Gwenyth Bender, MD   2.5 mg at 08/19/12 1503  . albuterol (PROVENTIL) (5 MG/ML) 0.5% nebulizer solution 2.5 mg  2.5 mg Nebulization Q4H PRN Gwenyth Bender, MD      . allopurinol (ZYLOPRIM) tablet 100 mg  100 mg Oral Daily Standley Brooking, MD   100 mg at 08/19/12 1033  . amiodarone (PACERONE) tablet 200 mg  200 mg Oral Daily Standley Brooking, MD   200 mg at 08/19/12 1033  . antiseptic oral rinse (BIOTENE) solution 15 mL  15 mL Mouth Rinse BID Gwenyth Bender, MD   15 mL at 08/19/12 0800  . aspirin chewable tablet 81 mg  81 mg Oral Daily Standley Brooking, MD   81 mg at 08/19/12 1033  . atorvastatin (LIPITOR) tablet 20 mg  20 mg Oral QHS Standley Brooking, MD   20 mg at 08/18/12 2219  . digoxin (LANOXIN) tablet 125 mcg  125 mcg Oral Daily Standley Brooking, MD   125 mcg at 08/18/12 0957    . furosemide (LASIX) injection 60 mg  60 mg Intravenous Q6H Standley Brooking, MD   60 mg at 08/19/12 1123  . hydrALAZINE (APRESOLINE) injection 5 mg  5 mg Intravenous Q6H PRN Standley Brooking, MD   5 mg at 08/16/12 1505  . insulin aspart (novoLOG) injection 0-15 Units  0-15 Units Subcutaneous TID WC Standley Brooking, MD   11 Units at 08/19/12 1203  . insulin glargine (LANTUS) injection 20 Units  20 Units Subcutaneous BID Gwenyth Bender, MD   20 Units at 08/19/12 1124  . irbesartan (AVAPRO) tablet 150 mg  150 mg Oral Daily Standley Brooking, MD   150 mg at 08/19/12 1123  . levofloxacin (LEVAQUIN) IVPB 750 mg  750 mg Intravenous Q24H Gwenyth Bender, MD   750 mg at 08/18/12 2219  . ondansetron (ZOFRAN) injection 4 mg  4 mg Intravenous Q6H PRN Standley Brooking, MD   4 mg at 08/16/12 2150  . oxyCODONE-acetaminophen (PERCOCET/ROXICET) 5-325 MG per tablet 1 tablet  1 tablet Oral Q8H PRN Robynn Pane, MD   1 tablet at 08/18/12 2219  .  pantoprazole (PROTONIX) EC tablet 40 mg  40 mg Oral Daily Standley Brooking, MD   40 mg at 08/19/12 1033  . predniSONE (STERAPRED UNI-PAK) tablet 10 mg  10 mg Oral AC breakfast Gwenyth Bender, MD   10 mg at 08/18/12 2145  . predniSONE (STERAPRED UNI-PAK) tablet 5 mg  5 mg Oral 3 x daily with food Gwenyth Bender, MD   5 mg at 08/19/12 1311  . predniSONE (STERAPRED UNI-PAK) tablet 5 mg  5 mg Oral 4X daily taper Gwenyth Bender, MD      . sodium chloride 0.9 % injection 3 mL  3 mL Intravenous Q12H Standley Brooking, MD   3 mL at 08/19/12 1035  . sodium chloride 0.9 % injection 3 mL  3 mL Intravenous PRN Standley Brooking, MD   3 mL at 08/17/12 0232  . spironolactone (ALDACTONE) tablet 25 mg  25 mg Oral Daily Standley Brooking, MD   25 mg at 08/19/12 1033  . warfarin (COUMADIN) tablet 2.5 mg  2.5 mg Oral ONCE-1800 Thuyvan Stacey Drain, PHARMD      . Warfarin - Pharmacist Dosing Inpatient   Does not apply q1800 Berkley Harvey, PHARMD      . zolpidem [facility]) tablet 5 mg  5 mg Oral QHS  PRN Standley Brooking, MD   5 mg at 08/18/12 2219  . DISCONTD: insulin glargine (LANTUS) injection 10 Units  10 Units Subcutaneous QHS Standley Brooking, MD   10 Units at 08/17/12 2255  . DISCONTD: insulin glargine (LANTUS) injection 15 Units  15 Units Subcutaneous BID Gwenyth Bender, MD   15 Units at 08/18/12 [phone]  . DISCONTD: methylPREDNISolone sodium succinate (SOLU-MEDROL) 40 mg/mL injection 40 mg  40 mg Intravenous Q12H Gwenyth Bender, MD   40 mg at 08/18/12 1228  . DISCONTD: predniSONE (STERAPRED UNI-PAK) tablet 10 mg  10 mg Oral Nightly Gwenyth Bender, MD      . DISCONTD: predniSONE (STERAPRED UNI-PAK) tablet 10 mg  10 mg Oral Nightly Gwenyth Bender, MD      . DISCONTD: predniSONE (STERAPRED UNI-PAK) tablet 5 mg  5 mg Oral PC lunch Gwenyth Bender, MD      . DISCONTD: predniSONE (STERAPRED UNI-PAK) tablet 5 mg  5 mg Oral PC supper Gwenyth Bender, MD        Objective: Blood pressure 176/62, pulse 57, temperature 97.5 F (36.4 C), temperature source Oral, resp. rate 20, height 5\' 3"  (1.6 m), weight 260 lb 5.8 oz (118.1 kg), SpO2 99.00%.  Well-developed obese black female in no acute distress. HEENT: No sinus tenderness. NECK: No posterior cervical nodes. LUNGS: Clear to auscultation. No wheezes. Few basilar crackles on the right base. CV: Normal S1, S2 without S3. ABD: Obese nontender. MSK: Trace pitting edema. Negative Homans. NEURO: Nonfocal.  Lab results: Results for orders placed during the hospital encounter of 08/15/12 (from the past 48 hour(s))  GLUCOSE, RANDOM     Status: Abnormal   Collection Time   08/17/12  5:53 PM      Component Value Range Comment   Glucose, Bld 443 (*) 70 - 99 mg/dL   GLUCOSE, CAPILLARY     Status: Abnormal   Collection Time   08/17/12  7:06 PM      Component Value Range Comment   Glucose-Capillary 439 (*) 70 - 99 mg/dL   GLUCOSE, CAPILLARY     Status: Abnormal   Collection Time   08/17/12  8:59  PM      Component Value Range Comment   Glucose-Capillary 470 (*) 70 -  99 mg/dL   GLUCOSE, CAPILLARY     Status: Abnormal   Collection Time   08/18/12 12:26 AM      Component Value Range Comment   Glucose-Capillary 429 (*) 70 - 99 mg/dL    Comment 1 Notify RN      Comment 2 Documented in Chart     GLUCOSE, CAPILLARY     Status: Abnormal   Collection Time   08/18/12  3:36 AM      Component Value Range Comment   Glucose-Capillary 447 (*) 70 - 99 mg/dL    Comment 1 Notify RN      Comment 2 Documented in Chart     GLUCOSE, CAPILLARY     Status: Abnormal   Collection Time   08/18/12  4:55 AM      Component Value Range Comment   Glucose-Capillary 392 (*) 70 - 99 mg/dL    Comment 1 Notify RN      Comment 2 Documented in Chart     PROTIME-INR     Status: Abnormal   Collection Time   08/18/12  6:40 AM      Component Value Range Comment   Prothrombin Time 30.2 (*) 11.6 - 15.2 seconds    INR 2.83 (*) 0.00 - 1.49   COMPREHENSIVE METABOLIC PANEL     Status: Abnormal   Collection Time   08/18/12  6:40 AM      Component Value Range Comment   Sodium 132 (*) 135 - 145 mEq/L    Potassium 5.1  3.5 - 5.1 mEq/L    Chloride 94 (*) 96 - 112 mEq/L    CO2 26  19 - 32 mEq/L    Glucose, Bld 367 (*) 70 - 99 mg/dL    BUN 82 (*) 6 - 23 mg/dL    Creatinine, Ser 2.95 (*) 0.50 - 1.10 mg/dL    Calcium 8.1 (*) 8.4 - 10.5 mg/dL    Total Protein 7.1  6.0 - 8.3 g/dL    Albumin 2.7 (*) 3.5 - 5.2 g/dL    AST 6  0 - 37 U/L    ALT 7  0 - 35 U/L    Alkaline Phosphatase 92  39 - 117 U/L    Total Bilirubin 0.1 (*) 0.3 - 1.2 mg/dL    GFR calc non Af Amer 17 (*) >90 mL/min    GFR calc Af Amer 20 (*) >90 mL/min   CBC WITH DIFFERENTIAL     Status: Abnormal   Collection Time   08/18/12  6:40 AM      Component Value Range Comment   WBC 13.3 (*) 4.0 - 10.5 K/uL    RBC 3.68 (*) 3.87 - 5.11 MIL/uL    Hemoglobin 8.9 (*) 12.0 - 15.0 g/dL    HCT 62.1 (*) 30.8 - 46.0 %    MCV 80.2  78.0 - 100.0 fL    MCH 24.2 (*) 26.0 - 34.0 pg    MCHC 30.2  30.0 - 36.0 g/dL    RDW 65.7 (*) 84.6 - 15.5 %      Platelets 278  150 - 400 K/uL    Neutrophils Relative 93 (*) 43 - 77 %    Lymphocytes Relative 4 (*) 12 - 46 %    Monocytes Relative 3  3 - 12 %    Eosinophils Relative 0  0 - 5 %  Basophils Relative 0  0 - 1 %    Neutro Abs 12.4 (*) 1.7 - 7.7 K/uL    Lymphs Abs 0.5 (*) 0.7 - 4.0 K/uL    Monocytes Absolute 0.4  0.1 - 1.0 K/uL    Eosinophils Absolute 0.0  0.0 - 0.7 K/uL    Basophils Absolute 0.0  0.0 - 0.1 K/uL    RBC Morphology RARE NRBCs      WBC Morphology MILD LEFT SHIFT (1-5% METAS, OCC MYELO, OCC BANDS)     GLUCOSE, CAPILLARY     Status: Abnormal   Collection Time   08/18/12  8:00 AM      Component Value Range Comment   Glucose-Capillary 364 (*) 70 - 99 mg/dL    Comment 1 Notify RN      Comment 2 Documented in Chart     GLUCOSE, CAPILLARY     Status: Abnormal   Collection Time   08/18/12 11:55 AM      Component Value Range Comment   Glucose-Capillary 402 (*) 70 - 99 mg/dL    Comment 1 Notify RN      Comment 2 Documented in Chart     GLUCOSE, CAPILLARY     Status: Abnormal   Collection Time   08/18/12  4:35 PM      Component Value Range Comment   Glucose-Capillary 352 (*) 70 - 99 mg/dL    Comment 1 Notify RN      Comment 2 Documented in Chart     GLUCOSE, CAPILLARY     Status: Abnormal   Collection Time   08/18/12 10:23 PM      Component Value Range Comment   Glucose-Capillary 414 (*) 70 - 99 mg/dL   PROTIME-INR     Status: Abnormal   Collection Time   08/19/12  4:38 AM      Component Value Range Comment   Prothrombin Time 31.1 (*) 11.6 - 15.2 seconds    INR 2.94 (*) 0.00 - 1.49   COMPREHENSIVE METABOLIC PANEL     Status: Abnormal   Collection Time   08/19/12  4:38 AM      Component Value Range Comment   Sodium 131 (*) 135 - 145 mEq/L    Potassium 5.2 (*) 3.5 - 5.1 mEq/L    Chloride 91 (*) 96 - 112 mEq/L    CO2 29  19 - 32 mEq/L    Glucose, Bld 383 (*) 70 - 99 mg/dL    BUN 782 (*) 6 - 23 mg/dL    Creatinine, Ser 9.56 (*) 0.50 - 1.10 mg/dL    Calcium 8.0 (*)  8.4 - 10.5 mg/dL    Total Protein 6.7  6.0 - 8.3 g/dL    Albumin 2.7 (*) 3.5 - 5.2 g/dL    AST 7  0 - 37 U/L    ALT 7  0 - 35 U/L    Alkaline Phosphatase 82  39 - 117 U/L    Total Bilirubin 0.1 (*) 0.3 - 1.2 mg/dL    GFR calc non Af Amer 15 (*) >90 mL/min    GFR calc Af Amer 18 (*) >90 mL/min   CBC WITH DIFFERENTIAL     Status: Abnormal   Collection Time   08/19/12  4:38 AM      Component Value Range Comment   WBC 11.1 (*) 4.0 - 10.5 K/uL    RBC 3.56 (*) 3.87 - 5.11 MIL/uL    Hemoglobin 8.5 (*) 12.0 - 15.0 g/dL  HCT 28.5 (*) 36.0 - 46.0 %    MCV 80.1  78.0 - 100.0 fL    MCH 23.9 (*) 26.0 - 34.0 pg    MCHC 29.8 (*) 30.0 - 36.0 g/dL    RDW 45.4 (*) 09.8 - 15.5 %    Platelets 265  150 - 400 K/uL    Neutrophils Relative 92 (*) 43 - 77 %    Lymphocytes Relative 3 (*) 12 - 46 %    Monocytes Relative 5  3 - 12 %    Eosinophils Relative 0  0 - 5 %    Basophils Relative 0  0 - 1 %    Neutro Abs 10.2 (*) 1.7 - 7.7 K/uL    Lymphs Abs 0.3 (*) 0.7 - 4.0 K/uL    Monocytes Absolute 0.6  0.1 - 1.0 K/uL    Eosinophils Absolute 0.0  0.0 - 0.7 K/uL    Basophils Absolute 0.0  0.0 - 0.1 K/uL    RBC Morphology POLYCHROMASIA PRESENT   RARE NRBCs   WBC Morphology TOXIC GRANULATION   MILD LEFT SHIFT (1-5% METAS, OCC MYELO, OCC BANDS)   Smear Review LARGE PLATELETS PRESENT     GLUCOSE, CAPILLARY     Status: Abnormal   Collection Time   08/19/12  7:25 AM      Component Value Range Comment   Glucose-Capillary 303 (*) 70 - 99 mg/dL   GLUCOSE, CAPILLARY     Status: Abnormal   Collection Time   08/19/12 11:24 AM      Component Value Range Comment   Glucose-Capillary 329 (*) 70 - 99 mg/dL   GLUCOSE, CAPILLARY     Status: Abnormal   Collection Time   08/19/12  4:56 PM      Component Value Range Comment   Glucose-Capillary 333 (*) 70 - 99 mg/dL     Studies/Results: No results found.  Patient Active Problem List  Diagnosis  . HYPERLIPIDEMIA  . OBSTRUCTIVE SLEEP APNEA  . HYPERTENSION  . ATRIAL  FIBRILLATION WITH RAPID VENTRICULAR RESPONSE  . Acute combined systolic and diastolic heart failure  . 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 BRONCHITIS, RECURRENT  . ASTHMA  . Obesity hypoventilation syndrome  . Prosthetic joint infection  . Group B streptococcal infection  . Gout  . Yeast infection involving the vagina and surrounding area  . SBO (small bowel obstruction)  . DM (diabetes mellitus)  . Chronic anticoagulation  . Oral candidiasis  . Laceration of leg  . Chronic venous insufficiency  . Acute respiratory failure  . Acute diastolic CHF (congestive heart failure)  . Acute asthma exacerbation  . Hypertensive urgency  . CKD (chronic kidney disease), stage III  . Anemia of chronic disease    Impression: Acute asthma resolving. Uncontrolled diabetes mellitus gradually improving. Acute on chronic congestive heart failure. Sleep apnea. Morbid obesity. Chronic kidney disease. Anemia of chronic disease. Her hemoglobin is still low for her multiple comorbidities.   Plan: Continue present therapy. Adjust her Lantus therapy further. Proceed with EPO therapy. Gradually increase activity as tolerated. Followup CBC, CMET, proBNP, BNP in a.m. Cardiology followup as indicated.   [name], Maximum Reiland 08/19/2012 5:32 PM

## 2012-08-20 LAB — COMPREHENSIVE METABOLIC PANEL
ALT: 7 U/L (ref 0–35)
AST: 8 U/L (ref 0–37)
CO2: 29 mEq/L (ref 19–32)
Calcium: 7.6 mg/dL — ABNORMAL LOW (ref 8.4–10.5)
Chloride: 93 mEq/L — ABNORMAL LOW (ref 96–112)
Creatinine, Ser: 3.04 mg/dL — ABNORMAL HIGH (ref 0.50–1.10)
GFR calc Af Amer: 18 mL/min — ABNORMAL LOW (ref >90)
GFR calc non Af Amer: 15 mL/min — ABNORMAL LOW (ref >90)
Glucose, Bld: 352 mg/dL — ABNORMAL HIGH (ref 70–99)
Total Bilirubin: 0.1 mg/dL — ABNORMAL LOW (ref 0.3–1.2)

## 2012-08-20 LAB — TROPONIN I: Troponin I: <0.3 ng/mL (ref <0.30)

## 2012-08-20 LAB — CBC WITH DIFFERENTIAL/PLATELET
Basophils Relative: 0 % (ref 0–1)
Eosinophils Absolute: 0 10*3/uL (ref 0.0–0.7)
Hemoglobin: 8.6 g/dL — ABNORMAL LOW (ref 12.0–15.0)
MCH: 24.4 pg — ABNORMAL LOW (ref 26.0–34.0)
MCHC: 30.5 g/dL (ref 30.0–36.0)
Monocytes Relative: 8 % (ref 3–12)
Neutro Abs: 9.9 10*3/uL — ABNORMAL HIGH (ref 1.7–7.7)
Neutrophils Relative %: 86 % — ABNORMAL HIGH (ref 43–77)
Platelets: 241 10*3/uL (ref 150–400)
RBC: 3.53 MIL/uL — ABNORMAL LOW (ref 3.87–5.11)

## 2012-08-20 LAB — GLUCOSE, CAPILLARY
Glucose-Capillary: 206 mg/dL — ABNORMAL HIGH (ref 70–99)
Glucose-Capillary: 245 mg/dL — ABNORMAL HIGH (ref 70–99)

## 2012-08-20 MED ORDER — POLYETHYLENE GLYCOL 3350 17 G PO PACK
17.0000 g | PACK | Freq: Every day | ORAL | Status: DC
  Administered 2012-08-20 – 2012-08-21 (×2): 17 g via ORAL
  Filled 2012-08-20 (×2): qty 1

## 2012-08-20 MED ORDER — DARBEPOETIN ALFA-POLYSORBATE 60 MCG/0.3ML IJ SOLN
60.0000 ug | Freq: Once | INTRAMUSCULAR | Status: AC
  Administered 2012-08-20: 60 ug via SUBCUTANEOUS
  Filled 2012-08-20: qty 0.3

## 2012-08-20 MED ORDER — INSULIN GLARGINE 100 UNIT/ML ~~LOC~~ SOLN
22.0000 [IU] | Freq: Two times a day (BID) | SUBCUTANEOUS | Status: DC
  Administered 2012-08-20 – 2012-08-28 (×17): 22 [IU] via SUBCUTANEOUS

## 2012-08-20 NOTE — Progress Notes (Signed)
ANTICOAGULATION CONSULT NOTE - Follow up  Pharmacy Consult for warfarin Indication: atrial fibrillation  Allergies  Allergen Reactions  . Daptomycin Other (See Comments)    Elevated CPK  . Morphine And Related Nausea And Vomiting  . Cephalexin Rash    unknown  . Celecoxib     unknown  . Codeine     unknown  . Fluoxetine Hcl     unknown  . Latex     REACTION: undefined  . Ofloxacin     unknown  . Penicillins     unknown  . Rofecoxib     unknown  . Sulfonamide Derivatives     unknown   Patient Measurements: Height: 5\' 3"  (160 cm) Weight: 257 lb 8 oz (116.8 kg) (standing scale) IBW/kg (Calculated) : 52.4   Vital Signs: Temp: 97.6 F (36.4 C) (09/12 0444) Temp src: Oral (09/12 0444) BP: 144/62 mmHg (09/12 0444) Pulse Rate: 42  (09/12 0821)  Labs:  Basename 08/20/12 0435 08/19/12 0438 08/18/12 0640  HGB 8.6* 8.5* --  HCT 28.2* 28.5* 29.5*  PLT 241 265 278  APTT -- -- --  LABPROT 33.1* 31.1* 30.2*  INR 3.18* 2.94* 2.83*  HEPARINUNFRC -- -- --  CREATININE 3.04* 3.08* 2.77*  CKTOTAL -- -- --  CKMB -- -- --  TROPONINI -- -- --   Estimated Creatinine Clearance: 23.7 ml/min (by C-G formula based on Cr of 3.04).  Assessment:  61 yo female admitted with CC wheezing to continue warfarin for hx afib per pharmacy dosing.  Patient was reportedly on 6mg  daily PTA with last dose on 9/6.    INR has been therapeutic, today up to 3.18  H/H low but stable.  No bleeding reported/documented.  SCr and K stabilizing at 5.1. Note patient is on spironolactone and ARB as PTA. Also on digoxin and amiodarone.   DDI with Levaquin noted - will monitor  Goal of Therapy:  INR 2-3   Plan:   No coumadin tonight   Daily PT/INR  MD:  Please consider decrease Levaquin to 750 mg q48h per renal function.  Also consider holding spironolactone and irbesartan in the setting of hyperkalemia.  Geoffry Paradise, PharmD, BCPS Pager: [phone] 9:57 AM Pharmacy #: [phone]

## 2012-08-20 NOTE — Progress Notes (Signed)
Subjective:  Patient reports not feeling well most of this afternoon. She missed and straining to have a bowel movement earlier today. During that time she became weak and diaphoretic. She did not experience chest pain. Patient has been noted to have some bradycardia more today than before. Her blood pressure however is been maintained. No tachyarrhythmias reported. Blood sugars have been decreasing today.   Allergies  Allergen Reactions  . Daptomycin Other (See Comments)    Elevated CPK  . Morphine And Related Nausea And Vomiting  . Cephalexin Rash    unknown  . Celecoxib     unknown  . Codeine     unknown  . Fluoxetine Hcl     unknown  . Latex     REACTION: undefined  . Ofloxacin     unknown  . Penicillins     unknown  . Rofecoxib     unknown  . Sulfonamide Derivatives     unknown   Current Facility-Administered Medications  Medication Dose Route Frequency Provider Last Rate Last Dose  . 0.9 %  sodium chloride infusion  250 mL Intravenous PRN Standley Brooking, MD 10 mL/hr at 08/15/12 2045 250 mL at 08/15/12 2045  . acetaminophen (TYLENOL) tablet 650 mg  650 mg Oral Q4H PRN Standley Brooking, MD      . albuterol (PROVENTIL) (5 MG/ML) 0.5% nebulizer solution 2.5 mg  2.5 mg Nebulization TID Gwenyth Bender, MD   2.5 mg at 08/20/12 1354  . albuterol (PROVENTIL) (5 MG/ML) 0.5% nebulizer solution 2.5 mg  2.5 mg Nebulization Q4H PRN Gwenyth Bender, MD      . allopurinol (ZYLOPRIM) tablet 100 mg  100 mg Oral Daily Standley Brooking, MD   100 mg at 08/20/12 1118  . amiodarone (PACERONE) tablet 200 mg  200 mg Oral Daily Standley Brooking, MD   200 mg at 08/20/12 1117  . antiseptic oral rinse (BIOTENE) solution 15 mL  15 mL Mouth Rinse BID Gwenyth Bender, MD   15 mL at 08/20/12 0800  . aspirin chewable tablet 81 mg  81 mg Oral Daily Standley Brooking, MD   81 mg at 08/20/12 1118  . atorvastatin (LIPITOR) tablet 20 mg  20 mg Oral QHS Standley Brooking, MD   20 mg at 08/19/12 2250  . digoxin  (LANOXIN) tablet 125 mcg  125 mcg Oral Daily Standley Brooking, MD   125 mcg at 08/18/12 0957  . furosemide (LASIX) injection 60 mg  60 mg Intravenous Q6H Standley Brooking, MD   60 mg at 08/20/12 1739  . hydrALAZINE (APRESOLINE) injection 5 mg  5 mg Intravenous Q6H PRN Standley Brooking, MD   5 mg at 08/16/12 1505  . insulin aspart (novoLOG) injection 0-15 Units  0-15 Units Subcutaneous TID WC Standley Brooking, MD   5 Units at 08/20/12 1739  . insulin glargine (LANTUS) injection 20 Units  20 Units Subcutaneous BID Gwenyth Bender, MD   20 Units at 08/20/12 1129  . irbesartan (AVAPRO) tablet 150 mg  150 mg Oral Daily Standley Brooking, MD   150 mg at 08/20/12 1118  . levofloxacin (LEVAQUIN) IVPB 750 mg  750 mg Intravenous Q24H Gwenyth Bender, MD   750 mg at 08/19/12 2250  . ondansetron (ZOFRAN) injection 4 mg  4 mg Intravenous Q6H PRN Standley Brooking, MD   4 mg at 08/20/12 1220  . oxyCODONE-acetaminophen (PERCOCET/ROXICET) 5-325 MG per tablet 1 tablet  1 tablet  Oral Q8H PRN Robynn Pane, MD   1 tablet at 08/19/12 2250  . pantoprazole (PROTONIX) EC tablet 40 mg  40 mg Oral Daily Standley Brooking, MD   40 mg at 08/20/12 1118  . polyethylene glycol (MIRALAX / GLYCOLAX) packet 17 g  17 g Oral Daily Gwenyth Bender, MD   17 g at 08/20/12 1220  . predniSONE (STERAPRED UNI-PAK) tablet 5 mg  5 mg Oral 4X daily taper Gwenyth Bender, MD   5 mg at 08/20/12 1739  . sodium chloride 0.9 % injection 3 mL  3 mL Intravenous Q12H Standley Brooking, MD   3 mL at 08/20/12 1119  . sodium chloride 0.9 % injection 3 mL  3 mL Intravenous PRN Standley Brooking, MD   3 mL at 08/17/12 0232  . spironolactone (ALDACTONE) tablet 25 mg  25 mg Oral Daily Standley Brooking, MD   25 mg at 08/20/12 1117  . Warfarin - Pharmacist Dosing Inpatient   Does not apply q1800 Berkley Harvey, PHARMD      . zolpidem [facility]) tablet 5 mg  5 mg Oral QHS PRN Standley Brooking, MD   5 mg at 08/19/12 2250    Objective: Blood pressure 160/73,  pulse 62, temperature 97.6 F (36.4 C), temperature source Oral, resp. rate 20, height 5\' 3"  (1.6 m), weight 257 lb 8 oz (116.8 kg), SpO2 96.00%.  Well-developed obese black female weak-appearing. No acute distress. Complains of lightheaded. HEENT: No sinus tenderness. NECK: No posterior cervical nodes. LUNGS: Clear to auscultation without wheezes. CV: Normal S1, S2 without S3. No ectopy appreciated. ABD: Nontender. MSK: Trace edema. Negative Homans. NEURO: Nonfocal.  Lab results: Results for orders placed during the hospital encounter of 08/15/12 (from the past 48 hour(s))  GLUCOSE, CAPILLARY     Status: Abnormal   Collection Time   08/18/12 10:23 PM      Component Value Range Comment   Glucose-Capillary 414 (*) 70 - 99 mg/dL   PROTIME-INR     Status: Abnormal   Collection Time   08/19/12  4:38 AM      Component Value Range Comment   Prothrombin Time 31.1 (*) 11.6 - 15.2 seconds    INR 2.94 (*) 0.00 - 1.49   COMPREHENSIVE METABOLIC PANEL     Status: Abnormal   Collection Time   08/19/12  4:38 AM      Component Value Range Comment   Sodium 131 (*) 135 - 145 mEq/L    Potassium 5.2 (*) 3.5 - 5.1 mEq/L    Chloride 91 (*) 96 - 112 mEq/L    CO2 29  19 - 32 mEq/L    Glucose, Bld 383 (*) 70 - 99 mg/dL    BUN 161 (*) 6 - 23 mg/dL    Creatinine, Ser 0.96 (*) 0.50 - 1.10 mg/dL    Calcium 8.0 (*) 8.4 - 10.5 mg/dL    Total Protein 6.7  6.0 - 8.3 g/dL    Albumin 2.7 (*) 3.5 - 5.2 g/dL    AST 7  0 - 37 U/L    ALT 7  0 - 35 U/L    Alkaline Phosphatase 82  39 - 117 U/L    Total Bilirubin 0.1 (*) 0.3 - 1.2 mg/dL    GFR calc non Af Amer 15 (*) >90 mL/min    GFR calc Af Amer 18 (*) >90 mL/min   CBC WITH DIFFERENTIAL     Status: Abnormal   Collection  Time   08/19/12  4:38 AM      Component Value Range Comment   WBC 11.1 (*) 4.0 - 10.5 K/uL    RBC 3.56 (*) 3.87 - 5.11 MIL/uL    Hemoglobin 8.5 (*) 12.0 - 15.0 g/dL    HCT 16.1 (*) 09.6 - 46.0 %    MCV 80.1  78.0 - 100.0 fL    MCH 23.9 (*)  26.0 - 34.0 pg    MCHC 29.8 (*) 30.0 - 36.0 g/dL    RDW 04.5 (*) 40.9 - 15.5 %    Platelets 265  150 - 400 K/uL    Neutrophils Relative 92 (*) 43 - 77 %    Lymphocytes Relative 3 (*) 12 - 46 %    Monocytes Relative 5  3 - 12 %    Eosinophils Relative 0  0 - 5 %    Basophils Relative 0  0 - 1 %    Neutro Abs 10.2 (*) 1.7 - 7.7 K/uL    Lymphs Abs 0.3 (*) 0.7 - 4.0 K/uL    Monocytes Absolute 0.6  0.1 - 1.0 K/uL    Eosinophils Absolute 0.0  0.0 - 0.7 K/uL    Basophils Absolute 0.0  0.0 - 0.1 K/uL    RBC Morphology POLYCHROMASIA PRESENT   RARE NRBCs   WBC Morphology TOXIC GRANULATION   MILD LEFT SHIFT (1-5% METAS, OCC MYELO, OCC BANDS)   Smear Review LARGE PLATELETS PRESENT     GLUCOSE, CAPILLARY     Status: Abnormal   Collection Time   08/19/12  7:25 AM      Component Value Range Comment   Glucose-Capillary 303 (*) 70 - 99 mg/dL   GLUCOSE, CAPILLARY     Status: Abnormal   Collection Time   08/19/12 11:24 AM      Component Value Range Comment   Glucose-Capillary 329 (*) 70 - 99 mg/dL   GLUCOSE, CAPILLARY     Status: Abnormal   Collection Time   08/19/12  4:56 PM      Component Value Range Comment   Glucose-Capillary 333 (*) 70 - 99 mg/dL   GLUCOSE, CAPILLARY     Status: Abnormal   Collection Time   08/19/12 10:14 PM      Component Value Range Comment   Glucose-Capillary 382 (*) 70 - 99 mg/dL   PROTIME-INR     Status: Abnormal   Collection Time   08/20/12  4:35 AM      Component Value Range Comment   Prothrombin Time 33.1 (*) 11.6 - 15.2 seconds    INR 3.18 (*) 0.00 - 1.49   COMPREHENSIVE METABOLIC PANEL     Status: Abnormal   Collection Time   08/20/12  4:35 AM      Component Value Range Comment   Sodium 134 (*) 135 - 145 mEq/L    Potassium 5.1  3.5 - 5.1 mEq/L    Chloride 93 (*) 96 - 112 mEq/L    CO2 29  19 - 32 mEq/L    Glucose, Bld 352 (*) 70 - 99 mg/dL    BUN 811 (*) 6 - 23 mg/dL    Creatinine, Ser 9.14 (*) 0.50 - 1.10 mg/dL    Calcium 7.6 (*) 8.4 - 10.5 mg/dL    Total  Protein 6.5  6.0 - 8.3 g/dL    Albumin 2.7 (*) 3.5 - 5.2 g/dL    AST 8  0 - 37 U/L    ALT 7  0 -  35 U/L    Alkaline Phosphatase 76  39 - 117 U/L    Total Bilirubin 0.1 (*) 0.3 - 1.2 mg/dL    GFR calc non Af Amer 15 (*) >90 mL/min    GFR calc Af Amer 18 (*) >90 mL/min   CBC WITH DIFFERENTIAL     Status: Abnormal   Collection Time   08/20/12  4:35 AM      Component Value Range Comment   WBC 11.5 (*) 4.0 - 10.5 K/uL    RBC 3.53 (*) 3.87 - 5.11 MIL/uL    Hemoglobin 8.6 (*) 12.0 - 15.0 g/dL    HCT 16.1 (*) 09.6 - 46.0 %    MCV 79.9  78.0 - 100.0 fL    MCH 24.4 (*) 26.0 - 34.0 pg    MCHC 30.5  30.0 - 36.0 g/dL    RDW 04.5 (*) 40.9 - 15.5 %    Platelets 241  150 - 400 K/uL    Neutrophils Relative 86 (*) 43 - 77 %    Neutro Abs 9.9 (*) 1.7 - 7.7 K/uL    Lymphocytes Relative 6 (*) 12 - 46 %    Lymphs Abs 0.7  0.7 - 4.0 K/uL    Monocytes Relative 8  3 - 12 %    Monocytes Absolute 0.9  0.1 - 1.0 K/uL    Eosinophils Relative 0  0 - 5 %    Eosinophils Absolute 0.0  0.0 - 0.7 K/uL    Basophils Relative 0  0 - 1 %    Basophils Absolute 0.0  0.0 - 0.1 K/uL   GLUCOSE, CAPILLARY     Status: Abnormal   Collection Time   08/20/12  8:29 AM      Component Value Range Comment   Glucose-Capillary 206 (*) 70 - 99 mg/dL    Comment 1 Documented in Chart      Comment 2 Notify RN     GLUCOSE, CAPILLARY     Status: Abnormal   Collection Time   08/20/12 11:58 AM      Component Value Range Comment   Glucose-Capillary 176 (*) 70 - 99 mg/dL    Comment 1 Documented in Chart      Comment 2 Notify RN     GLUCOSE, CAPILLARY     Status: Abnormal   Collection Time   08/20/12  4:49 PM      Component Value Range Comment   Glucose-Capillary 245 (*) 70 - 99 mg/dL     Studies/Results: No results found.  Patient Active Problem List  Diagnosis  . HYPERLIPIDEMIA  . OBSTRUCTIVE SLEEP APNEA  . HYPERTENSION  . ATRIAL FIBRILLATION WITH RAPID VENTRICULAR RESPONSE  . Acute combined systolic and diastolic heart  failure  . 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 BRONCHITIS, RECURRENT  . ASTHMA  . Obesity hypoventilation syndrome  . Prosthetic joint infection  . Group B streptococcal infection  . Gout  . Yeast infection involving the vagina and surrounding area  . SBO (small bowel obstruction)  . DM (diabetes mellitus)  . Chronic anticoagulation  . Oral candidiasis  . Laceration of leg  . Chronic venous insufficiency  . Acute respiratory failure  . Acute diastolic CHF (congestive heart failure)  . Acute asthma exacerbation  . Hypertensive urgency  . CKD (chronic kidney disease), stage III  . Anemia of chronic disease    Impression: Uncontrolled diabetes mellitus. Improving. Asthma steroid-dependent improved. Transient weakness and diaphoresis. Rule out anginal equivalent. Patient has  been straining excessively. Colonic dysfunction. Congestive heart failure. Previously compensated. Atrial fibrillation. Chronic Coumadin therapy. INR supratherapeutic.   Plan: Cardiac enzymes every 8 hours x3. MiraLAX daily versus when necessary. Adjust Lantus insulin further. Cardiology followup. EKG in a.m.   [name], Haynes Giannotti 08/20/2012 8:51 PM

## 2012-08-20 NOTE — Progress Notes (Signed)
MEDICATION RELATED CONSULT NOTE - INITIAL   Pharmacy Consult for Darbepoetin Indication: Anemia of CKD/stage III  Allergies  Allergen Reactions  . Daptomycin Other (See Comments)    Elevated CPK  . Morphine And Related Nausea And Vomiting  . Cephalexin Rash    unknown  . Celecoxib     unknown  . Codeine     unknown  . Fluoxetine Hcl     unknown  . Latex     REACTION: undefined  . Ofloxacin     unknown  . Penicillins     unknown  . Rofecoxib     unknown  . Sulfonamide Derivatives     unknown    Patient Measurements: Height: 5\' 3"  (160 cm) Weight: 257 lb 8 oz (116.8 kg) (standing scale) IBW/kg (Calculated) : 52.4   Vital Signs: Temp: 97.6 F (36.4 C) (09/12 1500) Temp src: Oral (09/12 1500) BP: 160/73 mmHg (09/12 1500) Pulse Rate: 62  (09/12 1500) Intake/Output from previous day: 09/11 0701 - 09/12 0700 In: 1912.7 [P.O.:840; I.V.:568.7; IV Piggyback:504] Out: 5550 [Urine:5550] Intake/Output from this shift:    Labs:  [facility] 08/20/12 0435 08/19/12 0438 08/18/12 0640  WBC 11.5* 11.1* 13.3*  HGB 8.6* 8.5* 8.9*  HCT 28.2* 28.5* 29.5*  PLT 241 265 278  APTT -- -- --  CREATININE 3.04* 3.08* 2.77*  LABCREA -- -- --  CREATININE 3.04* 3.08* 2.77*  CREAT24HRUR -- -- --  MG -- -- --  PHOS -- -- --  ALBUMIN 2.7* 2.7* 2.7*  PROT 6.5 6.7 7.1  ALBUMIN 2.7* 2.7* 2.7*  AST 8 7 6   ALT 7 7 7   ALKPHOS 76 82 92  BILITOT 0.1* 0.1* 0.1*  BILIDIR -- -- --  IBILI -- -- --   Estimated Creatinine Clearance: 23.7 ml/min (by C-G formula based on Cr of 3.04).   Microbiology: Recent Results (from the past 720 hour(s))  MRSA PCR SCREENING     Status: Normal   Collection Time   08/15/12  7:12 PM      Component Value Range Status Comment   MRSA by PCR NEGATIVE  NEGATIVE Final     Medical History: Past Medical History  Diagnosis Date  . Asthma   . Bronchitis   . Allergic rhinitis   . Diabetes mellitus   . Stroke   . HTN (hypertension)   . OSA (obstructive sleep  apnea)   . DJD (degenerative joint disease)   . Kidney stones   . Depression   . Hyperlipidemia   . Complication of anesthesia     trouble waking up  . CHF (congestive heart failure)     Medications:  Scheduled:    . albuterol  2.5 mg Nebulization TID  . allopurinol  100 mg Oral Daily  . amiodarone  200 mg Oral Daily  . antiseptic oral rinse  15 mL Mouth Rinse BID  . aspirin  81 mg Oral Daily  . atorvastatin  20 mg Oral QHS  . darbepoetin (ARANESP) injection - NON-DIALYSIS  60 mcg Subcutaneous Once  . digoxin  125 mcg Oral Daily  . furosemide  60 mg Intravenous Q6H  . insulin aspart  0-15 Units Subcutaneous TID WC  . insulin glargine  22 Units Subcutaneous BID  . irbesartan  150 mg Oral Daily  . levofloxacin (LEVAQUIN) IV  750 mg Intravenous Q24H  . pantoprazole  40 mg Oral Daily  . polyethylene glycol  17 g Oral Daily  . predniSONE  5 mg Oral 4X daily  taper  . sodium chloride  3 mL Intravenous Q12H  . spironolactone  25 mg Oral Daily  . Warfarin - Pharmacist Dosing Inpatient   Does not apply q1800  . DISCONTD: insulin glargine  20 Units Subcutaneous BID    Assessment:  Darbepoetin ordered for anemia of CKD.  Recommended dose = 0.45 mcg/kg q 4 wk (patient not on dialysis)  Hold Darbepoetin for Hgb > 10  Goal of Therapy:  Hgb > 10  Plan:  Darbepoetin 60 mcg x 1 tonight Monitor CBC  Otho Bellows PharmD Pager [phone] 08/20/2012,9:12 PM

## 2012-08-21 LAB — COMPREHENSIVE METABOLIC PANEL
ALT: 8 U/L (ref 0–35)
AST: 12 U/L (ref 0–37)
Albumin: 2.8 g/dL — ABNORMAL LOW (ref 3.5–5.2)
Alkaline Phosphatase: 78 U/L (ref 39–117)
CO2: 33 mEq/L — ABNORMAL HIGH (ref 19–32)
Chloride: 92 mEq/L — ABNORMAL LOW (ref 96–112)
Creatinine, Ser: 2.98 mg/dL — ABNORMAL HIGH (ref 0.50–1.10)
GFR calc non Af Amer: 16 mL/min — ABNORMAL LOW (ref >90)
Potassium: 5.3 mEq/L — ABNORMAL HIGH (ref 3.5–5.1)
Sodium: 135 mEq/L (ref 135–145)
Total Bilirubin: 0.1 mg/dL — ABNORMAL LOW (ref 0.3–1.2)

## 2012-08-21 LAB — CBC WITH DIFFERENTIAL/PLATELET
Basophils Relative: 0 % (ref 0–1)
Eosinophils Absolute: 0 10*3/uL (ref 0.0–0.7)
Eosinophils Relative: 0 % (ref 0–5)
Lymphs Abs: 0.7 10*3/uL (ref 0.7–4.0)
MCH: 24.7 pg — ABNORMAL LOW (ref 26.0–34.0)
MCHC: 30.9 g/dL (ref 30.0–36.0)
MCV: 80 fL (ref 78.0–100.0)
Platelets: 227 10*3/uL (ref 150–400)
RBC: 3.8 MIL/uL — ABNORMAL LOW (ref 3.87–5.11)

## 2012-08-21 LAB — GLUCOSE, CAPILLARY
Glucose-Capillary: 200 mg/dL — ABNORMAL HIGH (ref 70–99)
Glucose-Capillary: 291 mg/dL — ABNORMAL HIGH (ref 70–99)

## 2012-08-21 LAB — TROPONIN I
Troponin I: <0.3 ng/mL (ref <0.30)
Troponin I: <0.3 ng/mL (ref <0.30)

## 2012-08-21 LAB — PROTIME-INR
INR: 2.65 — ABNORMAL HIGH (ref 0.00–1.49)
Prothrombin Time: 28.7 seconds — ABNORMAL HIGH (ref 11.6–15.2)

## 2012-08-21 MED ORDER — WARFARIN SODIUM 4 MG PO TABS
4.0000 mg | ORAL_TABLET | Freq: Once | ORAL | Status: AC
  Administered 2012-08-21: 4 mg via ORAL
  Filled 2012-08-21: qty 1

## 2012-08-21 MED ORDER — BISACODYL 10 MG RE SUPP
10.0000 mg | Freq: Once | RECTAL | Status: AC
  Administered 2012-08-21: 10 mg via RECTAL
  Filled 2012-08-21: qty 1

## 2012-08-21 MED ORDER — POLYETHYLENE GLYCOL 3350 17 G PO PACK
17.0000 g | PACK | Freq: Two times a day (BID) | ORAL | Status: DC
  Administered 2012-08-21 – 2012-08-28 (×13): 17 g via ORAL
  Filled 2012-08-21 (×17): qty 1

## 2012-08-21 NOTE — Progress Notes (Signed)
Pt c/o constipation. MD notified, orders for ducolax supp and Miralax. Pt unable to pass BM and large, hard amount of stool observed in rectum. Pt disimpacted and administered soap suds enema. Pt passed large amount of stool and stated she felt "relief".  Earnest Conroy. Clelia Croft, RN

## 2012-08-21 NOTE — Progress Notes (Signed)
Dr. [name], patient with hyperkalemia (K = 5.3 and has been consistently elevated) - please consider holding spironolactone and irbesartan in this setting. Also consider adjusting Levaquin to 750 mg q48h per renal adjustment and transition to PO (?indication). HR in the 50s with amiodarone and digoxin on board.  BP ~150/60s, consider alternative for BP control if holding above agents.    Unable to get in touch with you today.  Thank you.  Geoffry Paradise, PharmD, BCPS Pager: [phone] 2:34 PM Pharmacy #: [phone]

## 2012-08-21 NOTE — Progress Notes (Signed)
Subjective:  Patient denies significant dyspnea today. She ambulated in room to the bathroom and back. No chest pains or increasing shortness of breath. She has had problems with constipation still today. No reports of significant tachycardia or severe bradycardia. She's been noted to have persistent hyperkalemia. Dietary habits reviewed while in the hospital.   Allergies  Allergen Reactions  . Daptomycin Other (See Comments)    Elevated CPK  . Morphine And Related Nausea And Vomiting  . Cephalexin Rash    unknown  . Celecoxib     unknown  . Codeine     unknown  . Fluoxetine Hcl     unknown  . Latex     REACTION: undefined  . Ofloxacin     unknown  . Penicillins     unknown  . Rofecoxib     unknown  . Sulfonamide Derivatives     unknown   Current Facility-Administered Medications  Medication Dose Route Frequency Provider Last Rate Last Dose  . 0.9 %  sodium chloride infusion  250 mL Intravenous PRN Standley Brooking, MD 10 mL/hr at 08/15/12 2045 250 mL at 08/15/12 2045  . acetaminophen (TYLENOL) tablet 650 mg  650 mg Oral Q4H PRN Standley Brooking, MD      . albuterol (PROVENTIL) (5 MG/ML) 0.5% nebulizer solution 2.5 mg  2.5 mg Nebulization TID Gwenyth Bender, MD   2.5 mg at 08/21/12 2058  . albuterol (PROVENTIL) (5 MG/ML) 0.5% nebulizer solution 2.5 mg  2.5 mg Nebulization Q4H PRN Gwenyth Bender, MD      . allopurinol (ZYLOPRIM) tablet 100 mg  100 mg Oral Daily Standley Brooking, MD   100 mg at 08/21/12 1043  . amiodarone (PACERONE) tablet 200 mg  200 mg Oral Daily Standley Brooking, MD   200 mg at 08/21/12 1043  . antiseptic oral rinse (BIOTENE) solution 15 mL  15 mL Mouth Rinse BID Gwenyth Bender, MD   15 mL at 08/21/12 0800  . aspirin chewable tablet 81 mg  81 mg Oral Daily Standley Brooking, MD   81 mg at 08/21/12 1043  . atorvastatin (LIPITOR) tablet 20 mg  20 mg Oral QHS Standley Brooking, MD   20 mg at 08/20/12 2234  . bisacodyl (DULCOLAX) suppository 10 mg  10 mg Rectal Once  Gwenyth Bender, MD   10 mg at 08/21/12 2109  . darbepoetin (ARANESP) injection 60 mcg  60 mcg Subcutaneous Once Gwenyth Bender, MD   60 mcg at 08/20/12 2238  . digoxin (LANOXIN) tablet 125 mcg  125 mcg Oral Daily Standley Brooking, MD   125 mcg at 08/18/12 0957  . furosemide (LASIX) injection 60 mg  60 mg Intravenous Q6H Standley Brooking, MD   60 mg at 08/21/12 1729  . hydrALAZINE (APRESOLINE) injection 5 mg  5 mg Intravenous Q6H PRN Standley Brooking, MD   5 mg at 08/16/12 1505  . insulin aspart (novoLOG) injection 0-15 Units  0-15 Units Subcutaneous TID WC Standley Brooking, MD   11 Units at 08/21/12 1730  . insulin glargine (LANTUS) injection 22 Units  22 Units Subcutaneous BID Gwenyth Bender, MD   22 Units at 08/21/12 1047  . irbesartan (AVAPRO) tablet 150 mg  150 mg Oral Daily Standley Brooking, MD   150 mg at 08/21/12 1043  . levofloxacin (LEVAQUIN) IVPB 750 mg  750 mg Intravenous Q24H Gwenyth Bender, MD   750 mg at 08/20/12 2238  .  ondansetron (ZOFRAN) injection 4 mg  4 mg Intravenous Q6H PRN Standley Brooking, MD   4 mg at 08/20/12 1220  . oxyCODONE-acetaminophen (PERCOCET/ROXICET) 5-325 MG per tablet 1 tablet  1 tablet Oral Q8H PRN Robynn Pane, MD   1 tablet at 08/19/12 2250  . pantoprazole (PROTONIX) EC tablet 40 mg  40 mg Oral Daily Standley Brooking, MD   40 mg at 08/21/12 1043  . polyethylene glycol (MIRALAX / GLYCOLAX) packet 17 g  17 g Oral BID Gwenyth Bender, MD   17 g at 08/21/12 2109  . predniSONE (STERAPRED UNI-PAK) tablet 5 mg  5 mg Oral 4X daily taper Gwenyth Bender, MD   5 mg at 08/21/12 1438  . sodium chloride 0.9 % injection 3 mL  3 mL Intravenous Q12H Standley Brooking, MD   3 mL at 08/20/12 2239  . sodium chloride 0.9 % injection 3 mL  3 mL Intravenous PRN Standley Brooking, MD   3 mL at 08/17/12 0232  . spironolactone (ALDACTONE) tablet 25 mg  25 mg Oral Daily Standley Brooking, MD   25 mg at 08/20/12 1117  . warfarin (COUMADIN) tablet 4 mg  4 mg Oral ONCE-1800 Thuyvan Thi Phan, PHARMD    4 mg at 08/21/12 1729  . Warfarin - Pharmacist Dosing Inpatient   Does not apply q1800 Berkley Harvey, PHARMD      . zolpidem [facility]) tablet 5 mg  5 mg Oral QHS PRN Standley Brooking, MD   5 mg at 08/20/12 2243  . DISCONTD: polyethylene glycol (MIRALAX / GLYCOLAX) packet 17 g  17 g Oral Daily Gwenyth Bender, MD   17 g at 08/21/12 1047    Objective: Blood pressure 156/63, pulse 67, temperature 98.1 F (36.7 C), temperature source Oral, resp. rate 20, height 5\' 3"  (1.6 m), weight 251 lb (113.853 kg), SpO2 98.00%.  Obese black female in no acute distress. HEENT: No sinus tenderness. NECK: No posterior cervical nodes. LUNGS: No wheezes appreciated. No vocal fremitus. CV: Normal S1, S2 without S3. ABD: Obese mild epigastric tenderness. MSK: Trace edema at most negative [place]. NEURO: Intact.  Lab results: Results for orders placed during the hospital encounter of 08/15/12 (from the past 48 hour(s))  GLUCOSE, CAPILLARY     Status: Abnormal   Collection Time   08/19/12 10:14 PM      Component Value Range Comment   Glucose-Capillary 382 (*) 70 - 99 mg/dL   PROTIME-INR     Status: Abnormal   Collection Time   08/20/12  4:35 AM      Component Value Range Comment   Prothrombin Time 33.1 (*) 11.6 - 15.2 seconds    INR 3.18 (*) 0.00 - 1.49   COMPREHENSIVE METABOLIC PANEL     Status: Abnormal   Collection Time   08/20/12  4:35 AM      Component Value Range Comment   Sodium 134 (*) 135 - 145 mEq/L    Potassium 5.1  3.5 - 5.1 mEq/L    Chloride 93 (*) 96 - 112 mEq/L    CO2 29  19 - 32 mEq/L    Glucose, Bld 352 (*) 70 - 99 mg/dL    BUN 161 (*) 6 - 23 mg/dL    Creatinine, Ser 0.96 (*) 0.50 - 1.10 mg/dL    Calcium 7.6 (*) 8.4 - 10.5 mg/dL    Total Protein 6.5  6.0 - 8.3 g/dL    Albumin 2.7 (*) 3.5 -  5.2 g/dL    AST 8  0 - 37 U/L    ALT 7  0 - 35 U/L    Alkaline Phosphatase 76  39 - 117 U/L    Total Bilirubin 0.1 (*) 0.3 - 1.2 mg/dL    GFR calc non Af Amer 15 (*) >90 mL/min    GFR  calc Af Amer 18 (*) >90 mL/min   CBC WITH DIFFERENTIAL     Status: Abnormal   Collection Time   08/20/12  4:35 AM      Component Value Range Comment   WBC 11.5 (*) 4.0 - 10.5 K/uL    RBC 3.53 (*) 3.87 - 5.11 MIL/uL    Hemoglobin 8.6 (*) 12.0 - 15.0 g/dL    HCT 16.1 (*) 09.6 - 46.0 %    MCV 79.9  78.0 - 100.0 fL    MCH 24.4 (*) 26.0 - 34.0 pg    MCHC 30.5  30.0 - 36.0 g/dL    RDW 04.5 (*) 40.9 - 15.5 %    Platelets 241  150 - 400 K/uL    Neutrophils Relative 86 (*) 43 - 77 %    Neutro Abs 9.9 (*) 1.7 - 7.7 K/uL    Lymphocytes Relative 6 (*) 12 - 46 %    Lymphs Abs 0.7  0.7 - 4.0 K/uL    Monocytes Relative 8  3 - 12 %    Monocytes Absolute 0.9  0.1 - 1.0 K/uL    Eosinophils Relative 0  0 - 5 %    Eosinophils Absolute 0.0  0.0 - 0.7 K/uL    Basophils Relative 0  0 - 1 %    Basophils Absolute 0.0  0.0 - 0.1 K/uL   GLUCOSE, CAPILLARY     Status: Abnormal   Collection Time   08/20/12  8:29 AM      Component Value Range Comment   Glucose-Capillary 206 (*) 70 - 99 mg/dL    Comment 1 Documented in Chart      Comment 2 Notify RN     GLUCOSE, CAPILLARY     Status: Abnormal   Collection Time   08/20/12 11:58 AM      Component Value Range Comment   Glucose-Capillary 176 (*) 70 - 99 mg/dL    Comment 1 Documented in Chart      Comment 2 Notify RN     GLUCOSE, CAPILLARY     Status: Abnormal   Collection Time   08/20/12  4:49 PM      Component Value Range Comment   Glucose-Capillary 245 (*) 70 - 99 mg/dL   TROPONIN I     Status: Normal   Collection Time   08/20/12  9:49 PM      Component Value Range Comment   Troponin I <0.30  <0.30 ng/mL   GLUCOSE, CAPILLARY     Status: Abnormal   Collection Time   08/20/12 10:10 PM      Component Value Range Comment   Glucose-Capillary 276 (*) 70 - 99 mg/dL    Comment 1 Notify RN     PROTIME-INR     Status: Abnormal   Collection Time   08/21/12  2:42 AM      Component Value Range Comment   Prothrombin Time 28.7 (*) 11.6 - 15.2 seconds    INR 2.65  (*) 0.00 - 1.49   COMPREHENSIVE METABOLIC PANEL     Status: Abnormal   Collection Time   08/21/12  2:42 AM  Component Value Range Comment   Sodium 135  135 - 145 mEq/L    Potassium 5.3 (*) 3.5 - 5.1 mEq/L    Chloride 92 (*) 96 - 112 mEq/L    CO2 33 (*) 19 - 32 mEq/L    Glucose, Bld 273 (*) 70 - 99 mg/dL    BUN 161 (*) 6 - 23 mg/dL    Creatinine, Ser 0.96 (*) 0.50 - 1.10 mg/dL    Calcium 8.3 (*) 8.4 - 10.5 mg/dL    Total Protein 6.7  6.0 - 8.3 g/dL    Albumin 2.8 (*) 3.5 - 5.2 g/dL    AST 12  0 - 37 U/L    ALT 8  0 - 35 U/L    Alkaline Phosphatase 78  39 - 117 U/L    Total Bilirubin 0.1 (*) 0.3 - 1.2 mg/dL    GFR calc non Af Amer 16 (*) >90 mL/min    GFR calc Af Amer 18 (*) >90 mL/min   CBC WITH DIFFERENTIAL     Status: Abnormal   Collection Time   08/21/12  2:42 AM      Component Value Range Comment   WBC 11.3 (*) 4.0 - 10.5 K/uL    RBC 3.80 (*) 3.87 - 5.11 MIL/uL    Hemoglobin 9.4 (*) 12.0 - 15.0 g/dL    HCT 04.5 (*) 40.9 - 46.0 %    MCV 80.0  78.0 - 100.0 fL    MCH 24.7 (*) 26.0 - 34.0 pg    MCHC 30.9  30.0 - 36.0 g/dL    RDW 81.1 (*) 91.4 - 15.5 %    Platelets 227  150 - 400 K/uL    Neutrophils Relative 87 (*) 43 - 77 %    Neutro Abs 9.9 (*) 1.7 - 7.7 K/uL    Lymphocytes Relative 6 (*) 12 - 46 %    Lymphs Abs 0.7  0.7 - 4.0 K/uL    Monocytes Relative 7  3 - 12 %    Monocytes Absolute 0.8  0.1 - 1.0 K/uL    Eosinophils Relative 0  0 - 5 %    Eosinophils Absolute 0.0  0.0 - 0.7 K/uL    Basophils Relative 0  0 - 1 %    Basophils Absolute 0.0  0.0 - 0.1 K/uL   TROPONIN I     Status: Normal   Collection Time   08/21/12  2:42 AM      Component Value Range Comment   Troponin I <0.30  <0.30 ng/mL   GLUCOSE, CAPILLARY     Status: Abnormal   Collection Time   08/21/12  7:26 AM      Component Value Range Comment   Glucose-Capillary 200 (*) 70 - 99 mg/dL    Comment 1 Notify RN      Comment 2 Documented in Chart     TROPONIN I     Status: Normal   Collection Time    08/21/12  8:35 AM      Component Value Range Comment   Troponin I <0.30  <0.30 ng/mL   GLUCOSE, CAPILLARY     Status: Abnormal   Collection Time   08/21/12 11:50 AM      Component Value Range Comment   Glucose-Capillary 243 (*) 70 - 99 mg/dL    Comment 1 Notify RN      Comment 2 Documented in Chart     GLUCOSE, CAPILLARY     Status: Abnormal  Collection Time   08/21/12  4:55 PM      Component Value Range Comment   Glucose-Capillary 310 (*) 70 - 99 mg/dL     Studies/Results: No results found.  Patient Active Problem List  Diagnosis  . HYPERLIPIDEMIA  . OBSTRUCTIVE SLEEP APNEA  . HYPERTENSION  . ATRIAL FIBRILLATION WITH RAPID VENTRICULAR RESPONSE  . Acute combined systolic and diastolic heart failure  . 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 BRONCHITIS, RECURRENT  . ASTHMA  . Obesity hypoventilation syndrome  . Prosthetic joint infection  . Group B streptococcal infection  . Gout  . Yeast infection involving the vagina and surrounding area  . SBO (small bowel obstruction)  . DM (diabetes mellitus)  . Chronic anticoagulation  . Oral candidiasis  . Laceration of leg  . Chronic venous insufficiency  . Acute respiratory failure  . Acute diastolic CHF (congestive heart failure)  . Acute asthma exacerbation  . Hypertensive urgency  . CKD (chronic kidney disease), stage III  . Anemia of chronic disease    Impression: Acute asthma improving. Steroid induced diabetes mellitus. Blood sugars still fluctuating. Colonic dysfunction. Hyperkalemia. Hypertension with mild exacerbation. History of atrial fibrillation on amiodarone. Transient bradycardia.   Plan: Adjust medications. Discontinued coronal lactone. Low-dose amlodipine in view of bradycardia. Increased stool softeners. Followup CMET. Cardiology followup as well.   [name], Glorianna Gott 08/21/2012 9:14 PM

## 2012-08-21 NOTE — Progress Notes (Signed)
ANTICOAGULATION CONSULT NOTE - Follow up  Pharmacy Consult for warfarin Indication: atrial fibrillation  Allergies  Allergen Reactions  . Daptomycin Other (See Comments)    Elevated CPK  . Morphine And Related Nausea And Vomiting  . Cephalexin Rash    unknown  . Celecoxib     unknown  . Codeine     unknown  . Fluoxetine Hcl     unknown  . Latex     REACTION: undefined  . Ofloxacin     unknown  . Penicillins     unknown  . Rofecoxib     unknown  . Sulfonamide Derivatives     unknown   Patient Measurements: Height: 5\' 3"  (160 cm) Weight: 251 lb (113.853 kg) IBW/kg (Calculated) : 52.4   Vital Signs: Temp: 98 F (36.7 C) (09/13 0615) Temp src: Oral (09/13 0615) BP: 150/66 mmHg (09/13 0615) Pulse Rate: 54  (09/13 0615)  Labs:  [name] 08/21/12 0835 08/21/12 0242 08/20/12 2149 08/20/12 0435 08/19/12 0438  HGB -- 9.4* -- 8.6* --  HCT -- 30.4* -- 28.2* 28.5*  PLT -- 227 -- 241 265  APTT -- -- -- -- --  LABPROT -- 28.7* -- 33.1* 31.1*  INR -- 2.65* -- 3.18* 2.94*  HEPARINUNFRC -- -- -- -- --  CREATININE -- 2.98* -- 3.04* 3.08*  CKTOTAL -- -- -- -- --  CKMB -- -- -- -- --  TROPONINI <0.30 <0.30 <0.30 -- --   Estimated Creatinine Clearance: 23.8 ml/min (by C-G formula based on Cr of 2.98).  Assessment:  61 yo female admitted with CC wheezing to continue warfarin for hx afib per pharmacy dosing.  Patient was reportedly on 6mg  daily PTA with last dose on 9/6.    NO coumadin last night, INR now back to 2.65.  H/H low but stable.  No bleeding reported/documented.  DDI with Levaquin noted - will monitor.  DDI with amio and digoxin but these are chronic meds.  Goal of Therapy:  INR 2-3   Plan:   Coumadin 4 mg x 1 tonight  Daily PT/INR  Geoffry Paradise, PharmD, BCPS Pager: [phone] 1:36 PM Pharmacy #: [phone]

## 2012-08-22 LAB — COMPREHENSIVE METABOLIC PANEL
AST: 9 U/L (ref 0–37)
Albumin: 3 g/dL — ABNORMAL LOW (ref 3.5–5.2)
Alkaline Phosphatase: 76 U/L (ref 39–117)
CO2: 33 mEq/L — ABNORMAL HIGH (ref 19–32)
Chloride: 93 mEq/L — ABNORMAL LOW (ref 96–112)
GFR calc non Af Amer: 14 mL/min — ABNORMAL LOW (ref >90)
Potassium: 4.8 mEq/L (ref 3.5–5.1)
Total Bilirubin: 0.2 mg/dL — ABNORMAL LOW (ref 0.3–1.2)

## 2012-08-22 LAB — DIGOXIN LEVEL: Digoxin Level: 0.9 ng/mL (ref 0.8–2.0)

## 2012-08-22 LAB — GLUCOSE, CAPILLARY: Glucose-Capillary: 192 mg/dL — ABNORMAL HIGH (ref 70–99)

## 2012-08-22 LAB — CBC WITH DIFFERENTIAL/PLATELET
Basophils Absolute: 0 10*3/uL (ref 0.0–0.1)
Eosinophils Absolute: 0.1 10*3/uL (ref 0.0–0.7)
Lymphs Abs: 0.8 10*3/uL (ref 0.7–4.0)
MCH: 24.3 pg — ABNORMAL LOW (ref 26.0–34.0)
MCHC: 30.1 g/dL (ref 30.0–36.0)
MCV: 80.6 fL (ref 78.0–100.0)
Monocytes Absolute: 1.3 10*3/uL — ABNORMAL HIGH (ref 0.1–1.0)
Neutrophils Relative %: 83 % — ABNORMAL HIGH (ref 43–77)
Platelets: 244 10*3/uL (ref 150–400)
RDW: 18 % — ABNORMAL HIGH (ref 11.5–15.5)

## 2012-08-22 LAB — PROTIME-INR
INR: 2.11 — ABNORMAL HIGH (ref 0.00–1.49)
Prothrombin Time: 24 seconds — ABNORMAL HIGH (ref 11.6–15.2)

## 2012-08-22 MED ORDER — ALBUTEROL SULFATE (5 MG/ML) 0.5% IN NEBU
2.5000 mg | INHALATION_SOLUTION | Freq: Two times a day (BID) | RESPIRATORY_TRACT | Status: DC
  Administered 2012-08-22 – 2012-08-29 (×15): 2.5 mg via RESPIRATORY_TRACT
  Filled 2012-08-22 (×15): qty 0.5

## 2012-08-22 MED ORDER — FUROSEMIDE 10 MG/ML IJ SOLN
60.0000 mg | Freq: Two times a day (BID) | INTRAMUSCULAR | Status: DC
  Administered 2012-08-22 – 2012-08-23 (×2): 60 mg via INTRAVENOUS
  Filled 2012-08-22 (×4): qty 6

## 2012-08-22 MED ORDER — WARFARIN SODIUM 4 MG PO TABS
4.0000 mg | ORAL_TABLET | Freq: Once | ORAL | Status: AC
  Administered 2012-08-22: 4 mg via ORAL
  Filled 2012-08-22: qty 1

## 2012-08-22 MED ORDER — LEVOFLOXACIN IN D5W 750 MG/150ML IV SOLN
750.0000 mg | INTRAVENOUS | Status: DC
  Administered 2012-08-23 – 2012-08-25 (×2): 750 mg via INTRAVENOUS
  Filled 2012-08-22 (×3): qty 150

## 2012-08-22 NOTE — Progress Notes (Signed)
Urine cont. Light pink, clear.  ntd by Dr. Algie Coffer today on rounds. Will cont to observe for change.  IV outdated. Pt. Very difficult stick.  IV team aware . Team member in to attempt restart; however, pt not in bed.  Stated would come back if possible when pt in bed.  Will cont to follow to see if team can get cath restarted.

## 2012-08-22 NOTE — Progress Notes (Signed)
ANTICOAGULATION CONSULT NOTE - Follow up  Pharmacy Consult for warfarin Indication: atrial fibrillation  Allergies  Allergen Reactions  . Daptomycin Other (See Comments)    Elevated CPK  . Morphine And Related Nausea And Vomiting  . Cephalexin Rash    unknown  . Celecoxib     unknown  . Codeine     unknown  . Fluoxetine Hcl     unknown  . Latex     REACTION: undefined  . Ofloxacin     unknown  . Penicillins     unknown  . Rofecoxib     unknown  . Sulfonamide Derivatives     unknown   Patient Measurements: Height: 5\' 3"  (160 cm) Weight: 242 lb 15.2 oz (110.2 kg) IBW/kg (Calculated) : 52.4   Vital Signs: Temp: 98.3 F (36.8 C) (09/14 0542) Temp src: Oral (09/14 0542) BP: 141/40 mmHg (09/14 0542) Pulse Rate: 58  (09/14 0542)  Labs:  Basename 08/22/12 0600 08/21/12 0835 08/21/12 0242 08/20/12 2149 08/20/12 0435  HGB 9.0* -- 9.4* -- --  HCT 29.9* -- 30.4* -- 28.2*  PLT 244 -- 227 -- 241  APTT -- -- -- -- --  LABPROT 24.0* -- 28.7* -- 33.1*  INR 2.11* -- 2.65* -- 3.18*  HEPARINUNFRC -- -- -- -- --  CREATININE 3.25* -- 2.98* -- 3.04*  CKTOTAL -- -- -- -- --  CKMB -- -- -- -- --  TROPONINI -- <0.30 <0.30 <0.30 --   Estimated Creatinine Clearance: 21.4 ml/min (by C-G formula based on Cr of 3.25).  Assessment:  61 yo female admitted with CC wheezing to continue warfarin for hx afib per pharmacy dosing.  Patient was reportedly on 6mg  daily PTA with last dose on 9/6.    Coumadin held 9/12 d/t supra-therapeutic INR (likely 2/2 DDI with Levaquin), INR now down to 2.11.  H/H low but stable.  No bleeding reported/documented.  DDI with Levaquin noted - monitoring and providing reduced coumadin doses.  DDI with amio and digoxin but these are chronic meds.  Goal of Therapy:  INR 2-3   Plan:   Repeat Coumadin 4 mg x 1 tonight  Daily PT/INR  [name], PharmD, BCPS Pager: [phone] 08/22/2012 7:45 AM

## 2012-08-22 NOTE — Progress Notes (Signed)
Subjective:  Feeling better. Some hematuria.with therapeutic INR. Possible bladder irritation by foley catheter as cause per patient. Afebrile.  Objective:  Vital Signs in the last 24 hours: Temp:  [97.7 F (36.5 C)-98.3 F (36.8 C)] 97.9 F (36.6 C) (09/14 1350) Pulse Rate:  [58-61] 61  (09/14 1350) Cardiac Rhythm:  [-] Sinus bradycardia (09/14 0800) Resp:  [18-20] 18  (09/14 1350) BP: (141-159)/(40-46) 159/44 mmHg (09/14 1350) SpO2:  [95 %-98 %] 97 % (09/14 1350) FiO2 (%):  [21 %] 21 % (09/14 1011) Weight:  [110.2 kg (242 lb 15.2 oz)] 110.2 kg (242 lb 15.2 oz) (09/14 0542)  Physical Exam: BP Readings from Last 1 Encounters:  08/22/12 159/44    Wt Readings from Last 1 Encounters:  08/22/12 110.2 kg (242 lb 15.2 oz)    Weight change: -3.653 kg (-8 lb 0.9 oz)  HEENT: [place]/AT, Eyes-Brown, PERL, EOMI, Conjunctiva-Pale pink, Sclera-Non-icteric Neck: No JVD, No bruit, Trachea midline. Lungs:  Clear, Bilateral. Cardiac:  Regular rhythm, normal S1 and S2, no S3.  Abdomen:  Soft, non-tender. Extremities:  1 + edema present. No cyanosis. No clubbing. CNS: AxOx3, Cranial nerves grossly intact, moves all 4 extremities. Right handed. Skin: Warm and dry.   Intake/Output from previous day: 09/13 0701 - 09/14 0700 In: 1518.2 [P.O.:840; I.V.:480.2; IV Piggyback:198] Out: 3450 [Urine:3450]    Lab Results: BMET    Component Value Date/Time   NA 136 08/22/2012 0600   K 4.8 08/22/2012 0600   CL 93* 08/22/2012 0600   CO2 33* 08/22/2012 0600   GLUCOSE 222* 08/22/2012 0600   BUN 120* 08/22/2012 0600   CREATININE 3.25* 08/22/2012 0600   CREATININE 2.81* 03/02/2012 1647   CALCIUM 8.2* 08/22/2012 0600   GFRNONAA 14* 08/22/2012 0600   GFRAA 16* 08/22/2012 0600   CBC    Component Value Date/Time   WBC 12.9* 08/22/2012 0600   RBC 3.71* 08/22/2012 0600   HGB 9.0* 08/22/2012 0600   HCT 29.9* 08/22/2012 0600   PLT 244 08/22/2012 0600   MCV 80.6 08/22/2012 0600   MCH 24.3* 08/22/2012 0600   MCHC 30.1  08/22/2012 0600   RDW 18.0* 08/22/2012 0600   LYMPHSABS 0.8 08/22/2012 0600   MONOABS 1.3* 08/22/2012 0600   EOSABS 0.1 08/22/2012 0600   BASOSABS 0.0 08/22/2012 0600   CARDIAC ENZYMES Lab Results  Component Value Date   CKTOTAL 56 01/23/2012   CKMB 2.3 01/23/2012   TROPONINI <0.30 08/21/2012    Assessment/Plan:  Patient Active Hospital Problem List: Acute respiratory failure (08/15/2012)   Assessment: Stable OBSTRUCTIVE SLEEP APNEA (03/15/2010) Obesity hypoventilation syndrome (04/20/2010) DM (diabetes mellitus) (11/23/2011) Acute diastolic CHF (congestive heart failure) (08/15/2012) Acute asthma exacerbation (08/15/2012) -Stable Hypertensive urgency (08/15/2012) -Stable CKD (chronic kidney disease), stage III (08/15/2012) Anemia of chronic disease (08/15/2012)  Continue medical treatment   LOS: 7 days    Nancy Cobb  MD  08/22/2012, 5:46 PM

## 2012-08-22 NOTE — Progress Notes (Signed)
Talked to patient about d/cing Foley catheter. Educated pt about infection risks. Pt refusing to have catheter d/c'ed at this time, wants to follow up with MD. Earnest Conroy. Clelia Croft, RN

## 2012-08-22 NOTE — Progress Notes (Signed)
Patient does has history of asthma and previous smoking history. Currently requiring no oxygen with clear diminished breath sounds. Patient uses albuterol PRN at home. RT will change patient to albuterol BID and continue PRN

## 2012-08-23 LAB — GLUCOSE, CAPILLARY
Glucose-Capillary: 195 mg/dL — ABNORMAL HIGH (ref 70–99)
Glucose-Capillary: 300 mg/dL — ABNORMAL HIGH (ref 70–99)
Glucose-Capillary: 168 mg/dL — ABNORMAL HIGH (ref 70–99)
Glucose-Capillary: 160 mg/dL — ABNORMAL HIGH (ref 70–99)

## 2012-08-23 LAB — COMPREHENSIVE METABOLIC PANEL
ALT: 9 U/L (ref 0–35)
AST: 12 U/L (ref 0–37)
Albumin: 2.9 g/dL — ABNORMAL LOW (ref 3.5–5.2)
CO2: 31 mEq/L (ref 19–32)
Calcium: 8.5 mg/dL (ref 8.4–10.5)
Creatinine, Ser: 3.27 mg/dL — ABNORMAL HIGH (ref 0.50–1.10)
GFR calc non Af Amer: 14 mL/min — ABNORMAL LOW (ref >90)
Sodium: 135 mEq/L (ref 135–145)

## 2012-08-23 LAB — CBC WITH DIFFERENTIAL/PLATELET
Basophils Relative: 0 % (ref 0–1)
Eosinophils Absolute: 0.1 10*3/uL (ref 0.0–0.7)
Eosinophils Relative: 1 % (ref 0–5)
HCT: 30.7 % — ABNORMAL LOW (ref 36.0–46.0)
Hemoglobin: 9.3 g/dL — ABNORMAL LOW (ref 12.0–15.0)
Lymphs Abs: 1.1 10*3/uL (ref 0.7–4.0)
MCH: 24.7 pg — ABNORMAL LOW (ref 26.0–34.0)
MCHC: 30.3 g/dL (ref 30.0–36.0)
MCV: 81.4 fL (ref 78.0–100.0)
Monocytes Absolute: 1 10*3/uL (ref 0.1–1.0)
Neutro Abs: 11.5 10*3/uL — ABNORMAL HIGH (ref 1.7–7.7)
Neutrophils Relative %: 84 % — ABNORMAL HIGH (ref 43–77)
RBC: 3.77 MIL/uL — ABNORMAL LOW (ref 3.87–5.11)

## 2012-08-23 LAB — PROTIME-INR: INR: 1.81 — ABNORMAL HIGH (ref 0.00–1.49)

## 2012-08-23 MED ORDER — WARFARIN SODIUM 5 MG PO TABS
5.0000 mg | ORAL_TABLET | Freq: Once | ORAL | Status: AC
  Administered 2012-08-23: 5 mg via ORAL
  Filled 2012-08-23: qty 1

## 2012-08-23 NOTE — Progress Notes (Signed)
Subjective:  Patient reports she's felt more nervous and jittery today. Denies any increase chest pains or shortness of breath. She resisted efforts to have her Foley catheter removed. She is ambulating to the bathroom with assistance. No severe bradycardia noted today. Blood sugars to remain in the 200s. Discussed options for disposition with her going home versus short-term rehabilitation. This is been discussed on several occasions with the patient is resisting going to rehabilitation. Her family has been able to provide only marginal medical care. Patient also asking about a recliner for home use. She does need to keep her legs elevated but she's not been able to do very well.   Allergies  Allergen Reactions  . Daptomycin Other (See Comments)    Elevated CPK  . Morphine And Related Nausea And Vomiting  . Cephalexin Rash    unknown  . Celecoxib     unknown  . Codeine     unknown  . Fluoxetine Hcl     unknown  . Latex     REACTION: undefined  . Ofloxacin     unknown  . Penicillins     unknown  . Rofecoxib     unknown  . Sulfonamide Derivatives     unknown   Current Facility-Administered Medications  Medication Dose Route Frequency Provider Last Rate Last Dose  . 0.9 %  sodium chloride infusion  250 mL Intravenous PRN Standley Brooking, MD 10 mL/hr at 08/15/12 2045 250 mL at 08/15/12 2045  . acetaminophen (TYLENOL) tablet 650 mg  650 mg Oral Q4H PRN Standley Brooking, MD      . albuterol (PROVENTIL) (5 MG/ML) 0.5% nebulizer solution 2.5 mg  2.5 mg Nebulization Q4H PRN Gwenyth Bender, MD      . albuterol (PROVENTIL) (5 MG/ML) 0.5% nebulizer solution 2.5 mg  2.5 mg Nebulization BID Gwenyth Bender, MD   2.5 mg at 08/23/12 9629  . allopurinol (ZYLOPRIM) tablet 100 mg  100 mg Oral Daily Standley Brooking, MD   100 mg at 08/23/12 1046  . amiodarone (PACERONE) tablet 200 mg  200 mg Oral Daily Standley Brooking, MD   200 mg at 08/23/12 1046  . antiseptic oral rinse (BIOTENE) solution 15 mL   15 mL Mouth Rinse BID Gwenyth Bender, MD   15 mL at 08/23/12 0800  . aspirin chewable tablet 81 mg  81 mg Oral Daily Standley Brooking, MD   81 mg at 08/23/12 1046  . atorvastatin (LIPITOR) tablet 20 mg  20 mg Oral QHS Standley Brooking, MD   20 mg at 08/22/12 2134  . digoxin (LANOXIN) tablet 125 mcg  125 mcg Oral Daily Standley Brooking, MD   125 mcg at 08/23/12 1046  . furosemide (LASIX) injection 60 mg  60 mg Intravenous BID Gwenyth Bender, MD   60 mg at 08/23/12 0855  . hydrALAZINE (APRESOLINE) injection 5 mg  5 mg Intravenous Q6H PRN Standley Brooking, MD   5 mg at 08/16/12 1505  . insulin aspart (novoLOG) injection 0-15 Units  0-15 Units Subcutaneous TID WC Standley Brooking, MD   8 Units at 08/23/12 1307  . insulin glargine (LANTUS) injection 22 Units  22 Units Subcutaneous BID Gwenyth Bender, MD   22 Units at 08/23/12 1050  . irbesartan (AVAPRO) tablet 150 mg  150 mg Oral Daily Standley Brooking, MD   150 mg at 08/23/12 1046  . levofloxacin (LEVAQUIN) IVPB 750 mg  750 mg Intravenous Q48H  Gwenyth Bender, MD      . ondansetron [facility]) injection 4 mg  4 mg Intravenous Q6H PRN Standley Brooking, MD   4 mg at 08/20/12 1220  . oxyCODONE-acetaminophen (PERCOCET/ROXICET) 5-325 MG per tablet 1 tablet  1 tablet Oral Q8H PRN Robynn Pane, MD   1 tablet at 08/19/12 2250  . pantoprazole (PROTONIX) EC tablet 40 mg  40 mg Oral Daily Standley Brooking, MD   40 mg at 08/23/12 1046  . polyethylene glycol (MIRALAX / GLYCOLAX) packet 17 g  17 g Oral BID Gwenyth Bender, MD   17 g at 08/23/12 1047  . predniSONE (STERAPRED UNI-PAK) tablet 5 mg  5 mg Oral 4X daily taper Gwenyth Bender, MD   5 mg at 08/23/12 0855  . sodium chloride 0.9 % injection 3 mL  3 mL Intravenous Q12H Standley Brooking, MD   3 mL at 08/23/12 1047  . sodium chloride 0.9 % injection 3 mL  3 mL Intravenous PRN Standley Brooking, MD   3 mL at 08/17/12 0232  . warfarin (COUMADIN) tablet 4 mg  4 mg Oral ONCE-1800 Maryanna Shape Runyon, PHARMD   4 mg at 08/22/12 1752    . warfarin (COUMADIN) tablet 5 mg  5 mg Oral ONCE-1800 Teressa Lower, PHARMD      . Warfarin - Pharmacist Dosing Inpatient   Does not apply q1800 Berkley Harvey, PHARMD      . zolpidem [facility]) tablet 5 mg  5 mg Oral QHS PRN Standley Brooking, MD   5 mg at 08/22/12 2240    Objective: Blood pressure 149/43, pulse 63, temperature 98.2 F (36.8 C), temperature source Oral, resp. rate 18, height 5\' 3"  (1.6 m), weight 248 lb 9.6 oz (112.764 kg), SpO2 97.00%.  Obese black female in no acute distress. HEENT: No sinus tenderness. NECK: No posterior cervical nodes. LUNGS: Clear without wheezes. CV: Normal S1, S2 without S3. ABD: Nontender. MSK: Negative edema. Dry brawny skin changes with scarring from psoriasis noted. Negative Homans. NEURO: Nonfocal.  Lab results: Results for orders placed during the hospital encounter of 08/15/12 (from the past 48 hour(s))  GLUCOSE, CAPILLARY     Status: Abnormal   Collection Time   08/21/12  9:27 PM      Component Value Range Comment   Glucose-Capillary 291 (*) 70 - 99 mg/dL    Comment 1 Notify RN     PROTIME-INR     Status: Abnormal   Collection Time   08/22/12  6:00 AM      Component Value Range Comment   Prothrombin Time 24.0 (*) 11.6 - 15.2 seconds    INR 2.11 (*) 0.00 - 1.49   COMPREHENSIVE METABOLIC PANEL     Status: Abnormal   Collection Time   08/22/12  6:00 AM      Component Value Range Comment   Sodium 136  135 - 145 mEq/L    Potassium 4.8  3.5 - 5.1 mEq/L    Chloride 93 (*) 96 - 112 mEq/L    CO2 33 (*) 19 - 32 mEq/L    Glucose, Bld 222 (*) 70 - 99 mg/dL    BUN 161 (*) 6 - 23 mg/dL    Creatinine, Ser 0.96 (*) 0.50 - 1.10 mg/dL    Calcium 8.2 (*) 8.4 - 10.5 mg/dL    Total Protein 6.9  6.0 - 8.3 g/dL    Albumin 3.0 (*) 3.5 - 5.2 g/dL  AST 9  0 - 37 U/L    ALT 8  0 - 35 U/L    Alkaline Phosphatase 76  39 - 117 U/L    Total Bilirubin 0.2 (*) 0.3 - 1.2 mg/dL    GFR calc non Af Amer 14 (*) >90 mL/min    GFR calc Af Amer 16  (*) >90 mL/min   CBC WITH DIFFERENTIAL     Status: Abnormal   Collection Time   08/22/12  6:00 AM      Component Value Range Comment   WBC 12.9 (*) 4.0 - 10.5 K/uL    RBC 3.71 (*) 3.87 - 5.11 MIL/uL    Hemoglobin 9.0 (*) 12.0 - 15.0 g/dL    HCT 96.0 (*) 45.4 - 46.0 %    MCV 80.6  78.0 - 100.0 fL    MCH 24.3 (*) 26.0 - 34.0 pg    MCHC 30.1  30.0 - 36.0 g/dL    RDW 09.8 (*) 11.9 - 15.5 %    Platelets 244  150 - 400 K/uL    Neutrophils Relative 83 (*) 43 - 77 %    Lymphocytes Relative 6 (*) 12 - 46 %    Monocytes Relative 10  3 - 12 %    Eosinophils Relative 1  0 - 5 %    Basophils Relative 0  0 - 1 %    Neutro Abs 10.7 (*) 1.7 - 7.7 K/uL    Lymphs Abs 0.8  0.7 - 4.0 K/uL    Monocytes Absolute 1.3 (*) 0.1 - 1.0 K/uL    Eosinophils Absolute 0.1  0.0 - 0.7 K/uL    Basophils Absolute 0.0  0.0 - 0.1 K/uL    RBC Morphology POLYCHROMASIA PRESENT      WBC Morphology MILD LEFT SHIFT (1-5% METAS, OCC MYELO, OCC BANDS)      Smear Review LARGE PLATELETS PRESENT   PENDING PATHOLOGIST REVIEW  DIGOXIN LEVEL     Status: Normal   Collection Time   08/22/12  6:00 AM      Component Value Range Comment   Digoxin Level 0.9  0.8 - 2.0 ng/mL   GLUCOSE, CAPILLARY     Status: Abnormal   Collection Time   08/22/12  8:29 AM      Component Value Range Comment   Glucose-Capillary 192 (*) 70 - 99 mg/dL    Comment 1 Notify RN      Comment 2 Documented in Chart     GLUCOSE, CAPILLARY     Status: Abnormal   Collection Time   08/22/12 11:51 AM      Component Value Range Comment   Glucose-Capillary 207 (*) 70 - 99 mg/dL    Comment 1 Notify RN      Comment 2 Documented in Chart     GLUCOSE, CAPILLARY     Status: Abnormal   Collection Time   08/22/12  5:23 PM      Component Value Range Comment   Glucose-Capillary 258 (*) 70 - 99 mg/dL   GLUCOSE, CAPILLARY     Status: Abnormal   Collection Time   08/22/12 10:34 PM      Component Value Range Comment   Glucose-Capillary 165 (*) 70 - 99 mg/dL    Comment 1  Documented in Chart      Comment 2 Notify RN     PROTIME-INR     Status: Abnormal   Collection Time   08/23/12  4:58 AM  Component Value Range Comment   Prothrombin Time 21.3 (*) 11.6 - 15.2 seconds    INR 1.81 (*) 0.00 - 1.49   COMPREHENSIVE METABOLIC PANEL     Status: Abnormal   Collection Time   08/23/12  4:58 AM      Component Value Range Comment   Sodium 135  135 - 145 mEq/L    Potassium 5.0  3.5 - 5.1 mEq/L    Chloride 93 (*) 96 - 112 mEq/L    CO2 31  19 - 32 mEq/L    Glucose, Bld 182 (*) 70 - 99 mg/dL    BUN 161 (*) 6 - 23 mg/dL    Creatinine, Ser 0.96 (*) 0.50 - 1.10 mg/dL    Calcium 8.5  8.4 - 04.5 mg/dL    Total Protein 6.7  6.0 - 8.3 g/dL    Albumin 2.9 (*) 3.5 - 5.2 g/dL    AST 12  0 - 37 U/L    ALT 9  0 - 35 U/L    Alkaline Phosphatase 73  39 - 117 U/L    Total Bilirubin 0.2 (*) 0.3 - 1.2 mg/dL    GFR calc non Af Amer 14 (*) >90 mL/min    GFR calc Af Amer 16 (*) >90 mL/min   CBC WITH DIFFERENTIAL     Status: Abnormal   Collection Time   08/23/12  4:58 AM      Component Value Range Comment   WBC 13.7 (*) 4.0 - 10.5 K/uL    RBC 3.77 (*) 3.87 - 5.11 MIL/uL    Hemoglobin 9.3 (*) 12.0 - 15.0 g/dL    HCT 40.9 (*) 81.1 - 46.0 %    MCV 81.4  78.0 - 100.0 fL    MCH 24.7 (*) 26.0 - 34.0 pg    MCHC 30.3  30.0 - 36.0 g/dL    RDW 91.4 (*) 78.2 - 15.5 %    Platelets 239  150 - 400 K/uL    Neutrophils Relative 84 (*) 43 - 77 %    Lymphocytes Relative 8 (*) 12 - 46 %    Monocytes Relative 7  3 - 12 %    Eosinophils Relative 1  0 - 5 %    Basophils Relative 0  0 - 1 %    Neutro Abs 11.5 (*) 1.7 - 7.7 K/uL    Lymphs Abs 1.1  0.7 - 4.0 K/uL    Monocytes Absolute 1.0  0.1 - 1.0 K/uL    Eosinophils Absolute 0.1  0.0 - 0.7 K/uL    Basophils Absolute 0.0  0.0 - 0.1 K/uL    WBC Morphology MILD LEFT SHIFT (1-5% METAS, OCC MYELO, OCC BANDS)      Smear Review LARGE PLATELETS PRESENT     GLUCOSE, CAPILLARY     Status: Abnormal   Collection Time   08/23/12  8:04 AM       Component Value Range Comment   Glucose-Capillary 195 (*) 70 - 99 mg/dL   GLUCOSE, CAPILLARY     Status: Abnormal   Collection Time   08/23/12 11:43 AM      Component Value Range Comment   Glucose-Capillary 279 (*) 70 - 99 mg/dL   GLUCOSE, CAPILLARY     Status: Abnormal   Collection Time   08/23/12 12:54 PM      Component Value Range Comment   Glucose-Capillary 300 (*) 70 - 99 mg/dL    Comment 1 Documented in Chart  Comment 2 Notify RN     GLUCOSE, CAPILLARY     Status: Abnormal   Collection Time   08/23/12  4:57 PM      Component Value Range Comment   Glucose-Capillary 168 (*) 70 - 99 mg/dL     Studies/Results: No results found.  Patient Active Problem List  Diagnosis  . HYPERLIPIDEMIA  . OBSTRUCTIVE SLEEP APNEA  . HYPERTENSION  . ATRIAL FIBRILLATION WITH RAPID VENTRICULAR RESPONSE  . Acute combined systolic and diastolic heart failure  . 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 BRONCHITIS, RECURRENT  . ASTHMA  . Obesity hypoventilation syndrome  . Prosthetic joint infection  . Group B streptococcal infection  . Gout  . Yeast infection involving the vagina and surrounding area  . SBO (small bowel obstruction)  . DM (diabetes mellitus)  . Chronic anticoagulation  . Oral candidiasis  . Laceration of leg  . Chronic venous insufficiency  . Acute respiratory failure  . Acute diastolic CHF (congestive heart failure)  . Acute asthma exacerbation  . Hypertensive urgency  . CKD (chronic kidney disease), stage III  . Anemia of chronic disease    Impression: Acute asthma resolving. Deconditioning multifactorial. Prerenal azotemia from excessive diuresis. This may also account for her feeling more nervous and jittery. Chronic kidney disease. Stage III. Uncontrolled diabetes mellitus exacerbated by steroid use. Gradually improving. Morbid obesity. Rule out recurrent UTI. Diastolic and systolic congestive heart failure compensated. History of atrial fibrillation presently stable. Sleep  apnea with variable compliance with CPAP. She has done better since being hospitalized. 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 She remains on antibiotics chronically. Infectious disease has evaluated.   Plan: Voiding trial with removal of Foley catheter. We'll obtain a repeat UA to exclude infection. DC IV Lasix. Followup CMET and CBC in a.m. Case management followup to assist patient with obtaining recliner today with lower extremity edema. Adjust Lantus insulin further.   [name], [place] 08/23/2012 5:18 PM

## 2012-08-23 NOTE — Progress Notes (Signed)
Dr. [name] in to see pt at 1730. MD made aware urine in foley bag recently emptied at 1600 tea-colored and malodorous, yet since urine clearing and now clear, yellow urine noted in bag. MD instructed nursing to remove foley this evening. See new orders entered into EPIC per Dr. [name].

## 2012-08-23 NOTE — Progress Notes (Signed)
ANTICOAGULATION CONSULT NOTE - Follow up  Pharmacy Consult for warfarin Indication: atrial fibrillation  Allergies  Allergen Reactions  . Daptomycin Other (See Comments)    Elevated CPK  . Morphine And Related Nausea And Vomiting  . Cephalexin Rash    unknown  . Celecoxib     unknown  . Codeine     unknown  . Fluoxetine Hcl     unknown  . Latex     REACTION: undefined  . Ofloxacin     unknown  . Penicillins     unknown  . Rofecoxib     unknown  . Sulfonamide Derivatives     unknown   Patient Measurements: Height: 5\' 3"  (160 cm) Weight: 248 lb 9.6 oz (112.764 kg) IBW/kg (Calculated) : 52.4   Vital Signs: Temp: 97.9 F (36.6 C) (09/15 0654) Temp src: Oral (09/15 0654) BP: 159/41 mmHg (09/15 0654) Pulse Rate: 55  (09/15 0654)  Labs:  Basename 08/23/12 0458 08/22/12 0600 08/21/12 0835 08/21/12 0242 08/20/12 2149  HGB 9.3* 9.0* -- -- --  HCT 30.7* 29.9* -- 30.4* --  PLT 239 244 -- 227 --  APTT -- -- -- -- --  LABPROT 21.3* 24.0* -- 28.7* --  INR 1.81* 2.11* -- 2.65* --  HEPARINUNFRC -- -- -- -- --  CREATININE 3.27* 3.25* -- 2.98* --  CKTOTAL -- -- -- -- --  CKMB -- -- -- -- --  TROPONINI -- -- <0.30 <0.30 <0.30   Estimated Creatinine Clearance: 21.6 ml/min (by C-G formula based on Cr of 3.27).  Assessment:  61 yo female admitted with CC wheezing to continue warfarin for hx afib per pharmacy dosing.  Patient was reportedly on 6mg  daily PTA with last dose on 9/6.    Coumadin held 9/12 d/t supra-therapeutic INR (likely 2/2 DDI with Levaquin), INR dropped to 1.81 now d/t held dose and was being cautious with dosing considering Levaquin interaction.  H/H low but stable.  Some hematuria reported.  Per MD note, possible bladder irritation by foley catheter as cause per patient.   DDI with Levaquin noted - monitoring and providing reduced coumadin doses.  DDI with amio and digoxin but these are chronic meds.  Goal of Therapy:  INR 2-3   Plan:   Coumadin 5  mg x 1 tonight  Daily PT/INR  [name], PharmD, BCPS Pager: [phone] 08/23/2012 7:52 AM

## 2012-08-24 LAB — COMPREHENSIVE METABOLIC PANEL
ALT: 9 U/L (ref 0–35)
AST: 10 U/L (ref 0–37)
CO2: 29 mEq/L (ref 19–32)
Calcium: 8.4 mg/dL (ref 8.4–10.5)
Chloride: 97 mEq/L (ref 96–112)
Creatinine, Ser: 3.18 mg/dL — ABNORMAL HIGH (ref 0.50–1.10)
GFR calc Af Amer: 17 mL/min — ABNORMAL LOW (ref >90)
GFR calc non Af Amer: 15 mL/min — ABNORMAL LOW (ref >90)
Glucose, Bld: 124 mg/dL — ABNORMAL HIGH (ref 70–99)
Total Bilirubin: 0.2 mg/dL — ABNORMAL LOW (ref 0.3–1.2)

## 2012-08-24 LAB — CBC WITH DIFFERENTIAL/PLATELET
Basophils Relative: 0 % (ref 0–1)
Eosinophils Absolute: 0.2 10*3/uL (ref 0.0–0.7)
MCH: 24.5 pg — ABNORMAL LOW (ref 26.0–34.0)
MCHC: 29.8 g/dL — ABNORMAL LOW (ref 30.0–36.0)
Neutro Abs: 9.4 10*3/uL — ABNORMAL HIGH (ref 1.7–7.7)
Neutrophils Relative %: 79 % — ABNORMAL HIGH (ref 43–77)
Platelets: 206 10*3/uL (ref 150–400)
RBC: 3.63 MIL/uL — ABNORMAL LOW (ref 3.87–5.11)

## 2012-08-24 LAB — GLUCOSE, CAPILLARY
Glucose-Capillary: 134 mg/dL — ABNORMAL HIGH (ref 70–99)
Glucose-Capillary: 165 mg/dL — ABNORMAL HIGH (ref 70–99)

## 2012-08-24 LAB — PROTIME-INR: INR: 1.66 — ABNORMAL HIGH (ref 0.00–1.49)

## 2012-08-24 LAB — PATHOLOGIST SMEAR REVIEW

## 2012-08-24 MED ORDER — WARFARIN SODIUM 7.5 MG PO TABS
7.5000 mg | ORAL_TABLET | Freq: Once | ORAL | Status: AC
Start: 2012-08-24 — End: 2012-08-24
  Administered 2012-08-24: 7.5 mg via ORAL
  Filled 2012-08-24: qty 1

## 2012-08-24 NOTE — Progress Notes (Signed)
Checked in with pt about assistance with CPAP, refusing at this time. States "feeling unwell ", RN aware.

## 2012-08-24 NOTE — Evaluation (Signed)
Occupational Therapy Evaluation Patient Details Name: [name] MRN: [id number] DOB: [birth year] Today's Date: 08/24/2012 Time: 6962-9528 OT Time Calculation (min): 14 min  OT Assessment / Plan / Recommendation Clinical Impression  Pt is a 61 yo female who presents with acute renal failure. Pt wants to return home with family assist at d/c. Skilled OT indicated to maximize independence with BADLs to supervision level in prep for safe d/c home with HHOT.    OT Assessment  Patient needs continued OT Services    Follow Up Recommendations  Home health OT    Barriers to Discharge      Equipment Recommendations  3 in 1 bedside comode    Recommendations for Other Services    Frequency  Min 2X/week    Precautions / Restrictions Precautions Precautions: Fall Restrictions Weight Bearing Restrictions: No   Pertinent Vitals/Pain No c/o pain    ADL  Grooming: Performed;Wash/dry hands;Min guard Where Assessed - Grooming: Supported standing Upper Body Bathing: Set up Where Assessed - Upper Body Bathing: Unsupported sitting Lower Body Bathing: Simulated;Moderate assistance Where Assessed - Lower Body Bathing: Supported sit to stand Upper Body Dressing: Simulated;Set up Where Assessed - Upper Body Dressing: Unsupported sitting Lower Body Dressing: Simulated;Moderate assistance Where Assessed - Lower Body Dressing: Supported sit to stand Toilet Transfer: Performed;Minimal Dentist Method: Sit to Barista: Regular height toilet;Grab bars Toileting - Clothing Manipulation and Hygiene: Performed;Moderate assistance (for thoroughness with hygeine.) Where Assessed - Toileting Clothing Manipulation and Hygiene: Sit to stand from 3-in-1 or toilet Equipment Used: Rolling walker ADL Comments: Pt states she struggles with wiping back peri area at home and states "it just doesnt get done very well." Pt struggled to rise from standard toilet.    OT  Diagnosis: Generalized weakness  OT Problem List: Decreased activity tolerance;Decreased knowledge of use of DME or AE;Obesity OT Treatment Interventions: Self-care/ADL training;Therapeutic activities;DME and/or AE instruction;Patient/family education   OT Goals Acute Rehab OT Goals OT Goal Formulation: With patient/family Time For Goal Achievement: 09/07/12 Potential to Achieve Goals: Good ADL Goals Pt Will Perform Grooming: with supervision;Standing at sink;Other (comment) (x 3 tasks to improve activity tolerance.) ADL Goal: Grooming - Progress: Goal set today Pt Will Perform Lower Body Bathing: with supervision;Sit to stand from chair;Sit to stand from bed;with adaptive equipment ADL Goal: Lower Body Bathing - Progress: Goal set today Pt Will Perform Lower Body Dressing: with supervision;Sit to stand from bed;Sit to stand from chair;with adaptive equipment ADL Goal: Lower Body Dressing - Progress: Goal set today Pt Will Transfer to Toilet: with supervision;3-in-1;Comfort height toilet ADL Goal: Toilet Transfer - Progress: Goal set today Pt Will Perform Toileting - Clothing Manipulation: with supervision;Sitting on 3-in-1 or toilet ADL Goal: Toileting - Clothing Manipulation - Progress: Goal set today Pt Will Perform Toileting - Hygiene: with supervision;Sit to stand from 3-in-1/toilet;with adaptive equipment ADL Goal: Toileting - Hygiene - Progress: Goal set today Miscellaneous OT Goals Miscellaneous OT Goal #1: Pt will utilize 3 energy conservation strategies during ADLs with min VCs. OT Goal: Miscellaneous Goal #1 - Progress: Goal set today  Visit Information  Last OT Received On: 08/24/12 Assistance Needed: +1 PT/OT Co-Evaluation/Treatment: Yes    Subjective Data  Subjective: I am NOT going to a nursing home. Patient Stated Goal: Return home with assistance from husband.   Prior Functioning  Vision/Perception  Home Living Lives With: Spouse Available Help at Discharge:  Family;Available 24 hours/day Type of Home: House Home Access: Stairs to enter [employer]  of Steps: 2 Entrance Stairs-Rails: Can reach both Home Layout: One level Bathroom Shower/Tub: Tub/shower unit (sponge baths) Bathroom Toilet: Standard Home Adaptive Equipment: Walker - rolling Prior Function Level of Independence: Needs assistance Needs Assistance: Bathing;Dressing Bath: Minimal (Pt states husband assists with LB ADLs.) Dressing: Minimal (Pt states husband assists with LB ADLs.) Able to Take Stairs?: Yes Driving: Yes Vocation: Retired Musician: No difficulties Dominant Hand: Right      Cognition  Overall Cognitive Status: Appears within functional limits for tasks assessed/performed Arousal/Alertness: Awake/alert Orientation Level: Appears intact for tasks assessed Behavior During Session: [facility] for tasks performed    Extremity/Trunk Assessment Right Upper Extremity Assessment RUE ROM/Strength/Tone: [facility] for tasks assessed Left Upper Extremity Assessment LUE ROM/Strength/Tone: WFL for tasks assessed   Mobility  Shoulder Instructions  Transfers Transfers: Sit to Stand;Stand to Sit Sit to Stand: 4: Min assist;With upper extremity assist;With armrests;From chair/3-in-1;From toilet Stand to Sit: 4: Min assist;With upper extremity assist;With armrests;To chair/3-in-1;To toilet Details for Transfer Assistance: Min VCs for hand placement and safety.       Exercise     Balance     End of Session OT - End of Session Activity Tolerance: Patient tolerated treatment well Patient left: in chair;with call bell/phone within reach  GO     Dakwan Pridgen A OTR/L [phone] 08/24/2012, 2:00 PM

## 2012-08-24 NOTE — Progress Notes (Signed)
Inpatient Diabetes Program Recommendations  AACE/ADA: New Consensus Statement on Inpatient Glycemic Control (2013)  Target Ranges:  Prepandial:   less than 140 mg/dL      Peak postprandial:   less than 180 mg/dL (1-2 hours)      Critically ill patients:  140 - 180 mg/dL   Reason for Visit: Hyperglycemia  Pt eating well.  Continues to have hyperglycemia. Results for [name], [name] (MRN [id number]) as of 08/24/2012 13:43  Ref. Range 08/23/2012 11:43 08/23/2012 12:54 08/23/2012 16:57 08/23/2012 21:14 08/24/2012 07:30 08/24/2012 12:11  Glucose-Capillary Latest Range: 70-99 mg/dL 409 (H) 811 (H) 914 (H) 160 (H) 134 (H) 240 (H)    May benefit from adding meal coverage insulin. Add Novolog 3 units tidwc if pt eats >50% meal.  Will follow.

## 2012-08-24 NOTE — Evaluation (Addendum)
Physical Therapy Evaluation Patient Details Name: [name] MRN: [id number] DOB: Feb 13, 1951 Today's Date: 08/24/2012 Time: 8119-1478 PT Time Calculation (min): 16 min  PT Assessment / Plan / Recommendation Clinical Impression  Pt presents with acute respiratory failure and wheezing with 2 pillow orthopnea and history of CVA, HTN, DM and afib.  Tolerated some ambulation in hallway and to/from restroom, however noted pt to fatigue quickly and required several standing rest breaks.  She states that this is normal for her at home.  Pt will benefit from skilled PT in acute venue to address deficits.  PT recommends HHPT for follow up at D/C to return pt to prior level of functioning.     PT Assessment  Patient needs continued PT services    Follow Up Recommendations  Home health PT    Barriers to Discharge None      Equipment Recommendations  3 in 1 bedside comode    Recommendations for Other Services     Frequency Min 3X/week    Precautions / Restrictions Precautions Precautions: Fall Restrictions Weight Bearing Restrictions: No   Pertinent Vitals/Pain No pain      Mobility  Bed Mobility Bed Mobility: Not assessed Details for Bed Mobility Assistance: Pt in recliner when PT arrived.  Transfers Transfers: Sit to Stand;Stand to Sit Sit to Stand: 4: Min assist;With upper extremity assist;With armrests;From chair/3-in-1;From toilet Stand to Sit: 4: Min assist;With upper extremity assist;With armrests;To chair/3-in-1;To toilet Details for Transfer Assistance: Min verbal cues for hand placement and safety when sitting/standing.  Ambulation/Gait Ambulation/Gait Assistance: 4: Min assist Ambulation Distance (Feet): 50 Feet Assistive device: Rolling walker Ambulation/Gait Assistance Details: Cues for upright posture and positioning with RW.  Noted pt to fatigue quickly, requiring several standing rest breaks.  Gait Pattern: Step-through pattern;Decreased stride length;Trunk  flexed Gait velocity: decreased Stairs: No Wheelchair Mobility Wheelchair Mobility: No    Exercises     PT Diagnosis: Difficulty walking;Generalized weakness  PT Problem List: Decreased strength;Decreased activity tolerance;Decreased balance;Decreased mobility;Obesity;Cardiopulmonary status limiting activity PT Treatment Interventions: DME instruction;Gait training;Stair training;Functional mobility training;Therapeutic activities;Therapeutic exercise;Balance training;Patient/family education   PT Goals Acute Rehab PT Goals PT Goal Formulation: With patient Time For Goal Achievement: 08/31/12 Potential to Achieve Goals: Good Pt will go Supine/Side to Sit: with supervision PT Goal: Supine/Side to Sit - Progress: Goal set today Pt will go Sit to Supine/Side: with supervision PT Goal: Sit to Supine/Side - Progress: Goal set today Pt will go Sit to Stand: with modified independence PT Goal: Sit to Stand - Progress: Goal set today Pt will Ambulate: 51 - 150 feet;with modified independence;with least restrictive assistive device PT Goal: Ambulate - Progress: Goal set today Pt will Go Up / Down Stairs: 1-2 stairs;with modified independence;with rail(s) PT Goal: Up/Down Stairs - Progress: Goal set today  Visit Information  Last PT Received On: 08/24/12 Assistance Needed: +1 PT/OT Co-Evaluation/Treatment: Yes    Subjective Data  Subjective: I always get a little SOB when I'm moving.  Patient Stated Goal: to go home with husband.    Prior Functioning  Home Living Lives With: Spouse Available Help at Discharge: Family;Available 24 hours/day Type of Home: House Home Access: Stairs to enter [employer] of Steps: 2 Entrance Stairs-Rails: Can reach both Home Layout: One level Bathroom Shower/Tub: Tub/shower unit (sponge baths) Bathroom Toilet: Standard Home Adaptive Equipment: Walker - rolling Prior Function Level of Independence: Needs assistance Needs Assistance:  Bathing;Dressing Bath: Minimal (Pt states husband assists with LB ADLs.) Dressing: Minimal (Pt states husband assists  with LB ADLs.) Able to Take Stairs?: Yes Driving: Yes Vocation: Retired Musician: No difficulties Dominant Hand: Right    Cognition  Overall Cognitive Status: Appears within functional limits for tasks assessed/performed Arousal/Alertness: Awake/alert Orientation Level: Appears intact for tasks assessed Behavior During Session: [facility] for tasks performed    Extremity/Trunk Assessment Right Upper Extremity Assessment RUE ROM/Strength/Tone: [facility] for tasks assessed Left Upper Extremity Assessment LUE ROM/Strength/Tone: [facility] for tasks assessed Right Lower Extremity Assessment RLE ROM/Strength/Tone: [facility] for tasks assessed Left Lower Extremity Assessment LLE ROM/Strength/Tone: WFL for tasks assessed   Balance    End of Session PT - End of Session Activity Tolerance: Patient limited by fatigue Patient left: in chair;with call bell/phone within reach;with family/visitor present Nurse Communication: Mobility status  GP     Page, Meribeth Mattes 08/24/2012, 3:15 PM

## 2012-08-24 NOTE — Progress Notes (Signed)
ANTICOAGULATION CONSULT NOTE - Follow up  Pharmacy Consult for warfarin Indication: atrial fibrillation  Allergies  Allergen Reactions  . Daptomycin Other (See Comments)    Elevated CPK  . Morphine And Related Nausea And Vomiting  . Cephalexin Rash    unknown  . Celecoxib     unknown  . Codeine     unknown  . Fluoxetine Hcl     unknown  . Latex     REACTION: undefined  . Ofloxacin     unknown  . Penicillins     unknown  . Rofecoxib     unknown  . Sulfonamide Derivatives     unknown   Patient Measurements: Height: 5\' 3"  (160 cm) Weight: 246 lb 4.1 oz (111.7 kg) IBW/kg (Calculated) : 52.4   Vital Signs: Temp: 97.6 F (36.4 C) (09/16 0500) Temp src: Oral (09/16 0500) BP: 172/38 mmHg (09/16 0500) Pulse Rate: 55  (09/16 0500)  Labs:  Basename 08/24/12 0537 08/23/12 0458 08/22/12 0600  HGB 8.9* 9.3* --  HCT 29.9* 30.7* 29.9*  PLT 206 239 244  APTT -- -- --  LABPROT 19.9* 21.3* 24.0*  INR 1.66* 1.81* 2.11*  HEPARINUNFRC -- -- --  CREATININE 3.18* 3.27* 3.25*  CKTOTAL -- -- --  CKMB -- -- --  TROPONINI -- -- --   Estimated Creatinine Clearance: 22 ml/min (by C-G formula based on Cr of 3.18).  Assessment:  61 yo female admitted with CC wheezing to continue warfarin for hx afib per pharmacy dosing.  Patient was reportedly on 6mg  daily PTA with last dose on 9/6.    INR subtherapeutic and trending down.  H/H low but stable.  Some hematuria reported this admission.   Potential DDI with Levaquin noted.  Concurrent amiodarone and digoxin noted.  Goal of Therapy:  INR 2-3   Plan:   Coumadin 7.5 mg x 1 tonight.  Daily PT/INR.  MD: If INR remains subtherapeutic, consider bridging with Lovenox. Recommend 1mg /kg Q24H.  Charolotte Eke, PharmD, pager [phone]. 08/24/2012,12:45 PM.

## 2012-08-25 LAB — CBC WITH DIFFERENTIAL/PLATELET
Eosinophils Absolute: 0.2 10*3/uL (ref 0.0–0.7)
Lymphocytes Relative: 13 % (ref 12–46)
Lymphs Abs: 1.3 10*3/uL (ref 0.7–4.0)
MCH: 24.2 pg — ABNORMAL LOW (ref 26.0–34.0)
Neutro Abs: 7.6 10*3/uL (ref 1.7–7.7)
Neutrophils Relative %: 78 % — ABNORMAL HIGH (ref 43–77)
Platelets: 228 10*3/uL (ref 150–400)
RBC: 3.68 MIL/uL — ABNORMAL LOW (ref 3.87–5.11)
WBC: 9.8 10*3/uL (ref 4.0–10.5)

## 2012-08-25 LAB — COMPREHENSIVE METABOLIC PANEL
BUN: 101 mg/dL — ABNORMAL HIGH (ref 6–23)
CO2: 27 mEq/L (ref 19–32)
Chloride: 101 mEq/L (ref 96–112)
Creatinine, Ser: 2.89 mg/dL — ABNORMAL HIGH (ref 0.50–1.10)
GFR calc Af Amer: 19 mL/min — ABNORMAL LOW (ref >90)
GFR calc non Af Amer: 16 mL/min — ABNORMAL LOW (ref >90)
Glucose, Bld: 151 mg/dL — ABNORMAL HIGH (ref 70–99)
Total Bilirubin: 0.2 mg/dL — ABNORMAL LOW (ref 0.3–1.2)

## 2012-08-25 LAB — GLUCOSE, CAPILLARY
Glucose-Capillary: 145 mg/dL — ABNORMAL HIGH (ref 70–99)
Glucose-Capillary: 232 mg/dL — ABNORMAL HIGH (ref 70–99)
Glucose-Capillary: 128 mg/dL — ABNORMAL HIGH (ref 70–99)

## 2012-08-25 MED ORDER — WARFARIN SODIUM 10 MG PO TABS
10.0000 mg | ORAL_TABLET | Freq: Once | ORAL | Status: AC
  Administered 2012-08-25: 10 mg via ORAL
  Filled 2012-08-25: qty 1

## 2012-08-25 NOTE — Progress Notes (Signed)
Occupational Therapy Treatment Patient Details Name: [name] MRN: [id number] DOB: 15-Mar-1951 Today's Date: 08/25/2012 Time: 8657-8469 OT Time Calculation (min): 13 min  OT Assessment / Plan / Recommendation Comments on Treatment Session Pt making steady progress towards goals. Pt hopes to return home this week.    Follow Up Recommendations  Home health OT    Barriers to Discharge       Equipment Recommendations  3 in 1 bedside comode    Recommendations for Other Services    Frequency Min 2X/week   Plan Discharge plan remains appropriate    Precautions / Restrictions Precautions Precautions: Fall Restrictions Weight Bearing Restrictions: No   Pertinent Vitals/Pain No c/o pain    ADL  Grooming: Performed;Wash/dry hands;Supervision/safety Where Assessed - Grooming: Supported Copywriter, advertising: Performed;Min Pension scheme manager Method: Sit to Barista: Regular height toilet;Grab bars Toileting - Architect and Hygiene: Performed;Minimal assistance Where Assessed - Engineer, mining and Hygiene: Sit to stand from 3-in-1 or toilet Equipment Used: Rolling walker ADL Comments: Pt had 1 slight LOB when turning to leave bathroom that she was able to self correct with RW.    OT Diagnosis:    OT Problem List:   OT Treatment Interventions:     OT Goals ADL Goals Pt Will Perform Grooming: with modified independence ADL Goal: Grooming - Progress: Updated due to goal met ADL Goal: Statistician - Progress: Progressing toward goals ADL Goal: Toileting - Clothing Manipulation - Progress: Progressing toward goals ADL Goal: Toileting - Hygiene - Progress: Progressing toward goals Miscellaneous OT Goals OT Goal: Miscellaneous Goal #1 - Progress: Progressing toward goals  Visit Information  Last OT Received On: 08/25/12 Assistance Needed: +1 PT/OT Co-Evaluation/Treatment: Yes    Subjective Data  Subjective: I'd  like to walk later on too.   Prior Functioning       Cognition  Overall Cognitive Status: Appears within functional limits for tasks assessed/performed Arousal/Alertness: Awake/alert Orientation Level: Appears intact for tasks assessed Behavior During Session: [facility] for tasks performed    Mobility  Shoulder Instructions Transfers Sit to Stand: 4: Min guard;With upper extremity assist;From chair/3-in-1;From toilet;With armrests Stand to Sit: 4: Min guard;With upper extremity assist;To chair/3-in-1;To toilet Details for Transfer Assistance: Min VCs for hand placement/safety.       Exercises      Balance Static Standing Balance Static Standing - Balance Support: No upper extremity supported;During functional activity Static Standing - Level of Assistance: 5: Stand by assistance   End of Session OT - End of Session Activity Tolerance: Patient tolerated treatment well Patient left: in chair;with call bell/phone within reach;with family/visitor present  GO     [name] A OTR/L [phone] 08/25/2012, 4:00 PM

## 2012-08-25 NOTE — Progress Notes (Signed)
Subjective:  Patient feeling better today. She does not feel as tremulous today. Mild queasiness at times. Denies any increase in exertional dyspnea. No wheezing. She does feel somewhat dry. Denies chest pains.   Allergies  Allergen Reactions  . Daptomycin Other (See Comments)    Elevated CPK  . Morphine And Related Nausea And Vomiting  . Cephalexin Rash    unknown  . Celecoxib     unknown  . Codeine     unknown  . Fluoxetine Hcl     unknown  . Latex     REACTION: undefined  . Ofloxacin     unknown  . Penicillins     unknown  . Rofecoxib     unknown  . Sulfonamide Derivatives     unknown   Current Facility-Administered Medications  Medication Dose Route Frequency Provider Last Rate Last Dose  . 0.9 %  sodium chloride infusion  250 mL Intravenous PRN Standley Brooking, MD 10 mL/hr at 08/15/12 2045 250 mL at 08/15/12 2045  . acetaminophen (TYLENOL) tablet 650 mg  650 mg Oral Q4H PRN Standley Brooking, MD   650 mg at 08/24/12 2001  . albuterol (PROVENTIL) (5 MG/ML) 0.5% nebulizer solution 2.5 mg  2.5 mg Nebulization Q4H PRN Gwenyth Bender, MD      . albuterol (PROVENTIL) (5 MG/ML) 0.5% nebulizer solution 2.5 mg  2.5 mg Nebulization BID Gwenyth Bender, MD   2.5 mg at 08/25/12 2000  . allopurinol (ZYLOPRIM) tablet 100 mg  100 mg Oral Daily Standley Brooking, MD   100 mg at 08/25/12 0951  . amiodarone (PACERONE) tablet 200 mg  200 mg Oral Daily Standley Brooking, MD   200 mg at 08/25/12 0951  . antiseptic oral rinse (BIOTENE) solution 15 mL  15 mL Mouth Rinse BID Gwenyth Bender, MD   15 mL at 08/25/12 2059  . aspirin chewable tablet 81 mg  81 mg Oral Daily Standley Brooking, MD   81 mg at 08/25/12 0951  . atorvastatin (LIPITOR) tablet 20 mg  20 mg Oral QHS Standley Brooking, MD   20 mg at 08/25/12 2113  . digoxin (LANOXIN) tablet 125 mcg  125 mcg Oral Daily Standley Brooking, MD   125 mcg at 08/25/12 [phone]  . hydrALAZINE (APRESOLINE) injection 5 mg  5 mg Intravenous Q6H PRN Standley Brooking, MD   5 mg at 08/16/12 1505  . insulin aspart (novoLOG) injection 0-15 Units  0-15 Units Subcutaneous TID WC Standley Brooking, MD   2 Units at 08/25/12 1735  . insulin glargine (LANTUS) injection 22 Units  22 Units Subcutaneous BID Gwenyth Bender, MD   22 Units at 08/25/12 2113  . irbesartan (AVAPRO) tablet 150 mg  150 mg Oral Daily Standley Brooking, MD   150 mg at 08/25/12 0951  . levofloxacin (LEVAQUIN) IVPB 750 mg  750 mg Intravenous Q48H Gwenyth Bender, MD   750 mg at 08/25/12 2114  . ondansetron (ZOFRAN) injection 4 mg  4 mg Intravenous Q6H PRN Standley Brooking, MD   4 mg at 08/20/12 1220  . oxyCODONE-acetaminophen (PERCOCET/ROXICET) 5-325 MG per tablet 1 tablet  1 tablet Oral Q8H PRN Robynn Pane, MD   1 tablet at 08/19/12 2250  . pantoprazole (PROTONIX) EC tablet 40 mg  40 mg Oral Daily Standley Brooking, MD   40 mg at 08/25/12 0951  . polyethylene glycol (MIRALAX / GLYCOLAX) packet 17 g  17 g  Oral BID Gwenyth Bender, MD   17 g at 08/25/12 2115  . sodium chloride 0.9 % injection 3 mL  3 mL Intravenous Q12H Standley Brooking, MD   3 mL at 08/25/12 2115  . sodium chloride 0.9 % injection 3 mL  3 mL Intravenous PRN Standley Brooking, MD   3 mL at 08/17/12 0232  . warfarin (COUMADIN) tablet 10 mg  10 mg Oral Once Gwenyth Bender, MD   10 mg at 08/25/12 1132  . Warfarin - Pharmacist Dosing Inpatient   Does not apply q1800 Berkley Harvey, PHARMD      . zolpidem [facility]) tablet 5 mg  5 mg Oral QHS PRN Standley Brooking, MD   5 mg at 08/22/12 2240    Objective: Blood pressure 172/31, pulse 57, temperature 97.6 F (36.4 C), temperature source Oral, resp. rate 18, height 5\' 3"  (1.6 m), weight 245 lb 6 oz (111.3 kg), SpO2 100.00%.  Obese black female in no acute distress. HEENT:no sinus tenderness. NECK:no enlarged thyroid. No posterior cervical nodes. LUNGS:clear to auscultation. No wheezes. No vocal fremitus. SN:KNLZJQ S1, S2. BHA:LPFXT. Nontender. KWI:OXBDZHGD Homans. Decreased skin  turgor. NEURO:nonfocal.  Lab results: Results for orders placed during the hospital encounter of 08/15/12 (from the past 48 hour(s))  PROTIME-INR     Status: Abnormal   Collection Time   08/24/12  5:37 AM      Component Value Range Comment   Prothrombin Time 19.9 (*) 11.6 - 15.2 seconds    INR 1.66 (*) 0.00 - 1.49   COMPREHENSIVE METABOLIC PANEL     Status: Abnormal   Collection Time   08/24/12  5:37 AM      Component Value Range Comment   Sodium 138  135 - 145 mEq/L    Potassium 4.7  3.5 - 5.1 mEq/L    Chloride 97  96 - 112 mEq/L    CO2 29  19 - 32 mEq/L    Glucose, Bld 124 (*) 70 - 99 mg/dL    BUN 924 (*) 6 - 23 mg/dL    Creatinine, Ser 2.68 (*) 0.50 - 1.10 mg/dL    Calcium 8.4  8.4 - 34.1 mg/dL    Total Protein 6.6  6.0 - 8.3 g/dL    Albumin 2.9 (*) 3.5 - 5.2 g/dL    AST 10  0 - 37 U/L    ALT 9  0 - 35 U/L    Alkaline Phosphatase 72  39 - 117 U/L    Total Bilirubin 0.2 (*) 0.3 - 1.2 mg/dL    GFR calc non Af Amer 15 (*) >90 mL/min    GFR calc Af Amer 17 (*) >90 mL/min   CBC WITH DIFFERENTIAL     Status: Abnormal   Collection Time   08/24/12  5:37 AM      Component Value Range Comment   WBC 11.9 (*) 4.0 - 10.5 K/uL    RBC 3.63 (*) 3.87 - 5.11 MIL/uL    Hemoglobin 8.9 (*) 12.0 - 15.0 g/dL    HCT 96.2 (*) 22.9 - 46.0 %    MCV 82.4  78.0 - 100.0 fL    MCH 24.5 (*) 26.0 - 34.0 pg    MCHC 29.8 (*) 30.0 - 36.0 g/dL    RDW 79.8 (*) 92.1 - 15.5 %    Platelets 206  150 - 400 K/uL    Neutrophils Relative 79 (*) 43 - 77 %    Neutro Abs 9.4 (*)  1.7 - 7.7 K/uL    Lymphocytes Relative 12  12 - 46 %    Lymphs Abs 1.4  0.7 - 4.0 K/uL    Monocytes Relative 7  3 - 12 %    Monocytes Absolute 0.8  0.1 - 1.0 K/uL    Eosinophils Relative 2  0 - 5 %    Eosinophils Absolute 0.2  0.0 - 0.7 K/uL    Basophils Relative 0  0 - 1 %    Basophils Absolute 0.0  0.0 - 0.1 K/uL   GLUCOSE, CAPILLARY     Status: Abnormal   Collection Time   08/24/12  7:30 AM      Component Value Range Comment    Glucose-Capillary 134 (*) 70 - 99 mg/dL   GLUCOSE, CAPILLARY     Status: Abnormal   Collection Time   08/24/12 12:11 PM      Component Value Range Comment   Glucose-Capillary 240 (*) 70 - 99 mg/dL   GLUCOSE, CAPILLARY     Status: Abnormal   Collection Time   08/24/12  4:54 PM      Component Value Range Comment   Glucose-Capillary 165 (*) 70 - 99 mg/dL   GLUCOSE, CAPILLARY     Status: Abnormal   Collection Time   08/24/12  9:50 PM      Component Value Range Comment   Glucose-Capillary 145 (*) 70 - 99 mg/dL   PROTIME-INR     Status: Abnormal   Collection Time   08/25/12  4:46 AM      Component Value Range Comment   Prothrombin Time 18.1 (*) 11.6 - 15.2 seconds    INR 1.47  0.00 - 1.49   COMPREHENSIVE METABOLIC PANEL     Status: Abnormal   Collection Time   08/25/12  4:46 AM      Component Value Range Comment   Sodium 138  135 - 145 mEq/L    Potassium 4.8  3.5 - 5.1 mEq/L    Chloride 101  96 - 112 mEq/L    CO2 27  19 - 32 mEq/L    Glucose, Bld 151 (*) 70 - 99 mg/dL    BUN 098 (*) 6 - 23 mg/dL    Creatinine, Ser 1.19 (*) 0.50 - 1.10 mg/dL    Calcium 8.8  8.4 - 14.7 mg/dL    Total Protein 6.5  6.0 - 8.3 g/dL    Albumin 2.9 (*) 3.5 - 5.2 g/dL    AST 11  0 - 37 U/L    ALT 9  0 - 35 U/L    Alkaline Phosphatase 74  39 - 117 U/L    Total Bilirubin 0.2 (*) 0.3 - 1.2 mg/dL    GFR calc non Af Amer 16 (*) >90 mL/min    GFR calc Af Amer 19 (*) >90 mL/min   CBC WITH DIFFERENTIAL     Status: Abnormal   Collection Time   08/25/12  4:46 AM      Component Value Range Comment   WBC 9.8  4.0 - 10.5 K/uL    RBC 3.68 (*) 3.87 - 5.11 MIL/uL    Hemoglobin 8.9 (*) 12.0 - 15.0 g/dL    HCT 82.9 (*) 56.2 - 46.0 %    MCV 82.3  78.0 - 100.0 fL    MCH 24.2 (*) 26.0 - 34.0 pg    MCHC 29.4 (*) 30.0 - 36.0 g/dL    RDW 13.0 (*) 86.5 - 15.5 %  Platelets 228  150 - 400 K/uL    Neutrophils Relative 78 (*) 43 - 77 %    Neutro Abs 7.6  1.7 - 7.7 K/uL    Lymphocytes Relative 13  12 - 46 %    Lymphs Abs  1.3  0.7 - 4.0 K/uL    Monocytes Relative 7  3 - 12 %    Monocytes Absolute 0.7  0.1 - 1.0 K/uL    Eosinophils Relative 3  0 - 5 %    Eosinophils Absolute 0.2  0.0 - 0.7 K/uL    Basophils Relative 0  0 - 1 %    Basophils Absolute 0.0  0.0 - 0.1 K/uL   GLUCOSE, CAPILLARY     Status: Abnormal   Collection Time   08/25/12  7:40 AM      Component Value Range Comment   Glucose-Capillary 135 (*) 70 - 99 mg/dL   GLUCOSE, CAPILLARY     Status: Abnormal   Collection Time   08/25/12 11:12 AM      Component Value Range Comment   Glucose-Capillary 232 (*) 70 - 99 mg/dL   GLUCOSE, CAPILLARY     Status: Abnormal   Collection Time   08/25/12  5:29 PM      Component Value Range Comment   Glucose-Capillary 128 (*) 70 - 99 mg/dL     Studies/Results: No results found.  Patient Active Problem List  Diagnosis  . HYPERLIPIDEMIA  . OBSTRUCTIVE SLEEP APNEA  . HYPERTENSION  . ATRIAL FIBRILLATION WITH RAPID VENTRICULAR RESPONSE  . Acute combined systolic and diastolic heart failure  . 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 BRONCHITIS, RECURRENT  . ASTHMA  . Obesity hypoventilation syndrome  . Prosthetic joint infection  . Group B streptococcal infection  . Gout  . Yeast infection involving the vagina and surrounding area  . SBO (small bowel obstruction)  . DM (diabetes mellitus)  . Chronic anticoagulation  . Oral candidiasis  . Laceration of leg  . Chronic venous insufficiency  . Acute respiratory failure  . Acute diastolic CHF (congestive heart failure)  . Acute asthma exacerbation  . Hypertensive urgency  . CKD (chronic kidney disease), stage III  . Anemia of chronic disease    Impression: Acute respiratory insufficiency with asthma improved. Diastolic and systolic congestive heart failure compensating. Prerenal azotemia transient uremia. BUN is gradually tapering. Diabetes mellitus. Blood sugars are improving. We'll need to monitor for hypoglycemia. Deconditioning multifactorial. Morbid  obesity. Sleep apnea. 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   Plan: Continue PT OT. Continue to hold IV Lasix. Recheck CBC, CMET in a.m. Monitor blood sugars with chest Lantus to avoid hypoglycemia. Did progress to discharge home.   [name], Deshara Rossi 08/25/2012 9:50 PM

## 2012-08-25 NOTE — Progress Notes (Signed)
Subjective:  The patient denies wheezing today. She does have some exertional dyspnea. She's felt occasionally nauseated with blurred vision. Her Lasix was held today due to her azotemia. Denies chest pains. Foley was removed today. She did experience some hesitancy but has voided.   Allergies  Allergen Reactions  . Daptomycin Other (See Comments)    Elevated CPK  . Morphine And Related Nausea And Vomiting  . Cephalexin Rash    unknown  . Celecoxib     unknown  . Codeine     unknown  . Fluoxetine Hcl     unknown  . Latex     REACTION: undefined  . Ofloxacin     unknown  . Penicillins     unknown  . Rofecoxib     unknown  . Sulfonamide Derivatives     unknown   Current Facility-Administered Medications  Medication Dose Route Frequency Provider Last Rate Last Dose  . 0.9 %  sodium chloride infusion  250 mL Intravenous PRN Standley Brooking, MD 10 mL/hr at 08/15/12 2045 250 mL at 08/15/12 2045  . acetaminophen (TYLENOL) tablet 650 mg  650 mg Oral Q4H PRN Standley Brooking, MD   650 mg at 08/24/12 2001  . albuterol (PROVENTIL) (5 MG/ML) 0.5% nebulizer solution 2.5 mg  2.5 mg Nebulization Q4H PRN Gwenyth Bender, MD      . albuterol (PROVENTIL) (5 MG/ML) 0.5% nebulizer solution 2.5 mg  2.5 mg Nebulization BID Gwenyth Bender, MD   2.5 mg at 08/24/12 2014  . allopurinol (ZYLOPRIM) tablet 100 mg  100 mg Oral Daily Standley Brooking, MD   100 mg at 08/24/12 1105  . amiodarone (PACERONE) tablet 200 mg  200 mg Oral Daily Standley Brooking, MD   200 mg at 08/24/12 1105  . antiseptic oral rinse (BIOTENE) solution 15 mL  15 mL Mouth Rinse BID Gwenyth Bender, MD   15 mL at 08/24/12 2000  . aspirin chewable tablet 81 mg  81 mg Oral Daily Standley Brooking, MD   81 mg at 08/24/12 1104  . atorvastatin (LIPITOR) tablet 20 mg  20 mg Oral QHS Standley Brooking, MD   20 mg at 08/24/12 2201  . digoxin (LANOXIN) tablet 125 mcg  125 mcg Oral Daily Standley Brooking, MD   125 mcg at 08/24/12 1105  . hydrALAZINE  (APRESOLINE) injection 5 mg  5 mg Intravenous Q6H PRN Standley Brooking, MD   5 mg at 08/16/12 1505  . insulin aspart (novoLOG) injection 0-15 Units  0-15 Units Subcutaneous TID WC Standley Brooking, MD   3 Units at 08/24/12 1700  . insulin glargine (LANTUS) injection 22 Units  22 Units Subcutaneous BID Gwenyth Bender, MD   22 Units at 08/24/12 2200  . irbesartan (AVAPRO) tablet 150 mg  150 mg Oral Daily Standley Brooking, MD   150 mg at 08/24/12 1105  . levofloxacin (LEVAQUIN) IVPB 750 mg  750 mg Intravenous Q48H Gwenyth Bender, MD   750 mg at 08/23/12 2156  . ondansetron (ZOFRAN) injection 4 mg  4 mg Intravenous Q6H PRN Standley Brooking, MD   4 mg at 08/20/12 1220  . oxyCODONE-acetaminophen (PERCOCET/ROXICET) 5-325 MG per tablet 1 tablet  1 tablet Oral Q8H PRN Robynn Pane, MD   1 tablet at 08/19/12 2250  . pantoprazole (PROTONIX) EC tablet 40 mg  40 mg Oral Daily Standley Brooking, MD   40 mg at 08/24/12 1105  .  polyethylene glycol (MIRALAX / GLYCOLAX) packet 17 g  17 g Oral BID Gwenyth Bender, MD   17 g at 08/24/12 1105  . sodium chloride 0.9 % injection 3 mL  3 mL Intravenous Q12H Standley Brooking, MD   3 mL at 08/24/12 2203  . sodium chloride 0.9 % injection 3 mL  3 mL Intravenous PRN Standley Brooking, MD   3 mL at 08/17/12 0232  . warfarin (COUMADIN) tablet 7.5 mg  7.5 mg Oral ONCE-1800 Gwenyth Bender, MD   7.5 mg at 08/24/12 1817  . Warfarin - Pharmacist Dosing Inpatient   Does not apply q1800 Berkley Harvey, PHARMD      . zolpidem [facility]) tablet 5 mg  5 mg Oral QHS PRN Standley Brooking, MD   5 mg at 08/22/12 2240    Objective: Blood pressure 175/37, pulse 63, temperature 98.3 F (36.8 C), temperature source Oral, resp. rate 18, height 5\' 3"  (1.6 m), weight 246 lb 4.1 oz (111.7 kg), SpO2 98.00%.  Well-developed obese black female in no acute distress. HEENT:no sinus tenderness. NECK:no enlarged thyroid. No posterior cervical nodes. LUNGS:clear to auscultation. No wheezes. No CVA  tenderness. ZO:XWRUEA S1, S2 without S3. VWU:JWJXB, nontender. JYN:WGNFAOZH Homans. Mild decreased skin turgor. NEURO:nonfocal.  Lab results: Results for orders placed during the hospital encounter of 08/15/12 (from the past 48 hour(s))  PROTIME-INR     Status: Abnormal   Collection Time   08/23/12  4:58 AM      Component Value Range Comment   Prothrombin Time 21.3 (*) 11.6 - 15.2 seconds    INR 1.81 (*) 0.00 - 1.49   COMPREHENSIVE METABOLIC PANEL     Status: Abnormal   Collection Time   08/23/12  4:58 AM      Component Value Range Comment   Sodium 135  135 - 145 mEq/L    Potassium 5.0  3.5 - 5.1 mEq/L    Chloride 93 (*) 96 - 112 mEq/L    CO2 31  19 - 32 mEq/L    Glucose, Bld 182 (*) 70 - 99 mg/dL    BUN 086 (*) 6 - 23 mg/dL    Creatinine, Ser 5.78 (*) 0.50 - 1.10 mg/dL    Calcium 8.5  8.4 - 46.9 mg/dL    Total Protein 6.7  6.0 - 8.3 g/dL    Albumin 2.9 (*) 3.5 - 5.2 g/dL    AST 12  0 - 37 U/L    ALT 9  0 - 35 U/L    Alkaline Phosphatase 73  39 - 117 U/L    Total Bilirubin 0.2 (*) 0.3 - 1.2 mg/dL    GFR calc non Af Amer 14 (*) >90 mL/min    GFR calc Af Amer 16 (*) >90 mL/min   CBC WITH DIFFERENTIAL     Status: Abnormal   Collection Time   08/23/12  4:58 AM      Component Value Range Comment   WBC 13.7 (*) 4.0 - 10.5 K/uL    RBC 3.77 (*) 3.87 - 5.11 MIL/uL    Hemoglobin 9.3 (*) 12.0 - 15.0 g/dL    HCT 62.9 (*) 52.8 - 46.0 %    MCV 81.4  78.0 - 100.0 fL    MCH 24.7 (*) 26.0 - 34.0 pg    MCHC 30.3  30.0 - 36.0 g/dL    RDW 41.3 (*) 24.4 - 15.5 %    Platelets 239  150 - 400 K/uL  Neutrophils Relative 84 (*) 43 - 77 %    Lymphocytes Relative 8 (*) 12 - 46 %    Monocytes Relative 7  3 - 12 %    Eosinophils Relative 1  0 - 5 %    Basophils Relative 0  0 - 1 %    Neutro Abs 11.5 (*) 1.7 - 7.7 K/uL    Lymphs Abs 1.1  0.7 - 4.0 K/uL    Monocytes Absolute 1.0  0.1 - 1.0 K/uL    Eosinophils Absolute 0.1  0.0 - 0.7 K/uL    Basophils Absolute 0.0  0.0 - 0.1 K/uL    WBC  Morphology MILD LEFT SHIFT (1-5% METAS, OCC MYELO, OCC BANDS)      Smear Review LARGE PLATELETS PRESENT     GLUCOSE, CAPILLARY     Status: Abnormal   Collection Time   08/23/12  8:04 AM      Component Value Range Comment   Glucose-Capillary 195 (*) 70 - 99 mg/dL   GLUCOSE, CAPILLARY     Status: Abnormal   Collection Time   08/23/12 11:43 AM      Component Value Range Comment   Glucose-Capillary 279 (*) 70 - 99 mg/dL   GLUCOSE, CAPILLARY     Status: Abnormal   Collection Time   08/23/12 12:54 PM      Component Value Range Comment   Glucose-Capillary 300 (*) 70 - 99 mg/dL    Comment 1 Documented in Chart      Comment 2 Notify RN     GLUCOSE, CAPILLARY     Status: Abnormal   Collection Time   08/23/12  4:57 PM      Component Value Range Comment   Glucose-Capillary 168 (*) 70 - 99 mg/dL   GLUCOSE, CAPILLARY     Status: Abnormal   Collection Time   08/23/12  9:14 PM      Component Value Range Comment   Glucose-Capillary 160 (*) 70 - 99 mg/dL   PROTIME-INR     Status: Abnormal   Collection Time   08/24/12  5:37 AM      Component Value Range Comment   Prothrombin Time 19.9 (*) 11.6 - 15.2 seconds    INR 1.66 (*) 0.00 - 1.49   COMPREHENSIVE METABOLIC PANEL     Status: Abnormal   Collection Time   08/24/12  5:37 AM      Component Value Range Comment   Sodium 138  135 - 145 mEq/L    Potassium 4.7  3.5 - 5.1 mEq/L    Chloride 97  96 - 112 mEq/L    CO2 29  19 - 32 mEq/L    Glucose, Bld 124 (*) 70 - 99 mg/dL    BUN 161 (*) 6 - 23 mg/dL    Creatinine, Ser 0.96 (*) 0.50 - 1.10 mg/dL    Calcium 8.4  8.4 - 04.5 mg/dL    Total Protein 6.6  6.0 - 8.3 g/dL    Albumin 2.9 (*) 3.5 - 5.2 g/dL    AST 10  0 - 37 U/L    ALT 9  0 - 35 U/L    Alkaline Phosphatase 72  39 - 117 U/L    Total Bilirubin 0.2 (*) 0.3 - 1.2 mg/dL    GFR calc non Af Amer 15 (*) >90 mL/min    GFR calc Af Amer 17 (*) >90 mL/min   CBC WITH DIFFERENTIAL     Status: Abnormal   Collection Time  08/24/12  5:37 AM       Component Value Range Comment   WBC 11.9 (*) 4.0 - 10.5 K/uL    RBC 3.63 (*) 3.87 - 5.11 MIL/uL    Hemoglobin 8.9 (*) 12.0 - 15.0 g/dL    HCT 16.1 (*) 09.6 - 46.0 %    MCV 82.4  78.0 - 100.0 fL    MCH 24.5 (*) 26.0 - 34.0 pg    MCHC 29.8 (*) 30.0 - 36.0 g/dL    RDW 04.5 (*) 40.9 - 15.5 %    Platelets 206  150 - 400 K/uL    Neutrophils Relative 79 (*) 43 - 77 %    Neutro Abs 9.4 (*) 1.7 - 7.7 K/uL    Lymphocytes Relative 12  12 - 46 %    Lymphs Abs 1.4  0.7 - 4.0 K/uL    Monocytes Relative 7  3 - 12 %    Monocytes Absolute 0.8  0.1 - 1.0 K/uL    Eosinophils Relative 2  0 - 5 %    Eosinophils Absolute 0.2  0.0 - 0.7 K/uL    Basophils Relative 0  0 - 1 %    Basophils Absolute 0.0  0.0 - 0.1 K/uL   GLUCOSE, CAPILLARY     Status: Abnormal   Collection Time   08/24/12  7:30 AM      Component Value Range Comment   Glucose-Capillary 134 (*) 70 - 99 mg/dL   GLUCOSE, CAPILLARY     Status: Abnormal   Collection Time   08/24/12 12:11 PM      Component Value Range Comment   Glucose-Capillary 240 (*) 70 - 99 mg/dL   GLUCOSE, CAPILLARY     Status: Abnormal   Collection Time   08/24/12  4:54 PM      Component Value Range Comment   Glucose-Capillary 165 (*) 70 - 99 mg/dL   GLUCOSE, CAPILLARY     Status: Abnormal   Collection Time   08/24/12  9:50 PM      Component Value Range Comment   Glucose-Capillary 145 (*) 70 - 99 mg/dL     Studies/Results: No results found.  Patient Active Problem List  Diagnosis  . HYPERLIPIDEMIA  . OBSTRUCTIVE SLEEP APNEA  . HYPERTENSION  . ATRIAL FIBRILLATION WITH RAPID VENTRICULAR RESPONSE  . Acute combined systolic and diastolic heart failure  . 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 BRONCHITIS, RECURRENT  . ASTHMA  . Obesity hypoventilation syndrome  . Prosthetic joint infection  . Group B streptococcal infection  . Gout  . Yeast infection involving the vagina and surrounding area  . SBO (small bowel obstruction)  . DM (diabetes mellitus)  . Chronic anticoagulation    . Oral candidiasis  . Laceration of leg  . Chronic venous insufficiency  . Acute respiratory failure  . Acute diastolic CHF (congestive heart failure)  . Acute asthma exacerbation  . Hypertensive urgency  . CKD (chronic kidney disease), stage III  . Anemia of chronic disease    Impression: Acute asthma resolved. Congestive heart failure compensated. Prerenal azotemia. Early uremic symptoms. Diabetes mellitus. Atrial fibrillation with rapid ventricular response. Subtherapeutic INR. Morbid obesity. Deconditioning. Sleep apnea.   Plan: Continue to hold Lasix. Followup CMET in a.m. PT, OT evaluation and treatment. Warfarin adjusted per pharmacy protocol.   Zollie Ellery 08/24/2012, 11:00 p.m.

## 2012-08-25 NOTE — Progress Notes (Signed)
ANTICOAGULATION CONSULT NOTE - Follow up  Pharmacy Consult for warfarin Indication: atrial fibrillation  Allergies  Allergen Reactions  . Daptomycin Other (See Comments)    Elevated CPK  . Morphine And Related Nausea And Vomiting  . Cephalexin Rash    unknown  . Celecoxib     unknown  . Codeine     unknown  . Fluoxetine Hcl     unknown  . Latex     REACTION: undefined  . Ofloxacin     unknown  . Penicillins     unknown  . Rofecoxib     unknown  . Sulfonamide Derivatives     unknown   Patient Measurements: Height: 5\' 3"  (160 cm) Weight: 245 lb 6 oz (111.3 kg) IBW/kg (Calculated) : 52.4   Vital Signs: Temp: 97.8 F (36.6 C) (09/17 0511) Temp src: Oral (09/17 0511) BP: 154/38 mmHg (09/17 0511) Pulse Rate: 51  (09/17 0511)  Labs:  Basename 08/25/12 0446 08/24/12 0537 08/23/12 0458  HGB 8.9* 8.9* --  HCT 30.3* 29.9* 30.7*  PLT 228 206 239  APTT -- -- --  LABPROT 18.1* 19.9* 21.3*  INR 1.47 1.66* 1.81*  HEPARINUNFRC -- -- --  CREATININE 2.89* 3.18* 3.27*  CKTOTAL -- -- --  CKMB -- -- --  TROPONINI -- -- --   Estimated Creatinine Clearance: 24.2 ml/min (by C-G formula based on Cr of 2.89).  Assessment:  61 yo female admitted with CC wheezing to continue warfarin for hx afib per pharmacy dosing.  Patient was reportedly on 6mg  daily PTA with last dose on 9/6.    INR subtherapeutic and trending down. Coumadin doses charted as given.  H/H low but stable.  Some hematuria reported this admission.   Potential DDI with Levaquin, amiodarone, and digoxin noted.  Goal of Therapy:  INR 2-3   Plan:   Coumadin 10mg  x 1 today at noon.  Daily PT/INR.  Charolotte Eke, PharmD, pager [phone]. 08/25/2012,11:02 AM.

## 2012-08-25 NOTE — Progress Notes (Signed)
Physical Therapy Treatment Patient Details Name: AMOY STEEVES MRN: [id number] DOB: 1951/10/24 Today's Date: 08/25/2012 Time: 7829-5621 PT Time Calculation (min): 21 min  PT Assessment / Plan / Recommendation Comments on Treatment Session  Pt progressing well with ambulation and states that she does want to walk with nursing later.  Continue to note SOB with ambulation, however O2 sats are at 100% on RA.  Cues to take standing rest breaks when needed.     Follow Up Recommendations  Home health PT    Barriers to Discharge        Equipment Recommendations  3 in 1 bedside comode    Recommendations for Other Services    Frequency Min 3X/week   Plan Discharge plan remains appropriate    Precautions / Restrictions Precautions Precautions: Fall Restrictions Weight Bearing Restrictions: No   Pertinent Vitals/Pain No pain    Mobility  Bed Mobility Bed Mobility: Not assessed (Pt in recliner when PT arrived. ) Transfers Transfers: Sit to Stand;Stand to Sit Sit to Stand: 4: Min guard;With upper extremity assist;From chair/3-in-1;From toilet;With armrests Stand to Sit: 4: Min guard;With upper extremity assist;To chair/3-in-1;To toilet Details for Transfer Assistance: Min VCs for hand placement/safety. Ambulation/Gait Ambulation/Gait Assistance: 4: Min guard Ambulation Distance (Feet): 130 Feet Assistive device: Rolling walker Ambulation/Gait Assistance Details: Min cues for maintaining position inside of RW while ambulating.  Noted SOB with ambulation, O2 sats were 100% on RA.  Gait Pattern: Step-through pattern;Decreased stride length;Trunk flexed Gait velocity: decreased    Exercises     PT Diagnosis:    PT Problem List:   PT Treatment Interventions:     PT Goals Acute Rehab PT Goals PT Goal Formulation: With patient Time For Goal Achievement: 08/31/12 Potential to Achieve Goals: Good Pt will go Sit to Stand: with modified independence PT Goal: Sit to Stand -  Progress: Progressing toward goal Pt will Ambulate: 51 - 150 feet;with modified independence;with least restrictive assistive device PT Goal: Ambulate - Progress: Progressing toward goal  Visit Information  Last PT Received On: 08/25/12 Assistance Needed: +1    Subjective Data  Subjective: I want to walk with nursing later.  Patient Stated Goal: to go home with husband.    Cognition  Overall Cognitive Status: Appears within functional limits for tasks assessed/performed Arousal/Alertness: Awake/alert Orientation Level: Appears intact for tasks assessed Behavior During Session: [facility] for tasks performed    Balance  Static Standing Balance Static Standing - Balance Support: No upper extremity supported;During functional activity Static Standing - Level of Assistance: 5: Stand by assistance  End of Session PT - End of Session Activity Tolerance: Patient limited by fatigue Patient left: in chair;with call bell/phone within reach;with family/visitor present Nurse Communication: Mobility status   GP     Page, Meribeth Mattes 08/25/2012, 4:58 PM

## 2012-08-25 NOTE — Progress Notes (Signed)
Pt placed on cpap of 7.0 per home settings. Pt tolerating well at this time. RT to continue to monitor.

## 2012-08-26 ENCOUNTER — Ambulatory Visit: Payer: Medicare HMO | Admitting: Infectious Disease

## 2012-08-26 LAB — CBC WITH DIFFERENTIAL/PLATELET
Basophils Absolute: 0 10*3/uL (ref 0.0–0.1)
Basophils Relative: 0 % (ref 0–1)
Eosinophils Absolute: 0.2 10*3/uL (ref 0.0–0.7)
Eosinophils Relative: 2 % (ref 0–5)
HCT: 29.1 % — ABNORMAL LOW (ref 36.0–46.0)
Lymphocytes Relative: 10 % — ABNORMAL LOW (ref 12–46)
MCH: 24.3 pg — ABNORMAL LOW (ref 26.0–34.0)
MCHC: 29.2 g/dL — ABNORMAL LOW (ref 30.0–36.0)
MCV: 83.1 fL (ref 78.0–100.0)
Monocytes Absolute: 1 10*3/uL (ref 0.1–1.0)
Platelets: 208 10*3/uL (ref 150–400)
RDW: 18.8 % — ABNORMAL HIGH (ref 11.5–15.5)

## 2012-08-26 LAB — COMPREHENSIVE METABOLIC PANEL
ALT: 11 U/L (ref 0–35)
AST: 12 U/L (ref 0–37)
Albumin: 2.7 g/dL — ABNORMAL LOW (ref 3.5–5.2)
Alkaline Phosphatase: 73 U/L (ref 39–117)
Chloride: 103 mEq/L (ref 96–112)
Potassium: 5 mEq/L (ref 3.5–5.1)
Sodium: 138 mEq/L (ref 135–145)
Total Bilirubin: 0.2 mg/dL — ABNORMAL LOW (ref 0.3–1.2)
Total Protein: 6.2 g/dL (ref 6.0–8.3)

## 2012-08-26 LAB — PROTIME-INR: Prothrombin Time: 19.2 seconds — ABNORMAL HIGH (ref 11.6–15.2)

## 2012-08-26 LAB — GLUCOSE, CAPILLARY: Glucose-Capillary: 135 mg/dL — ABNORMAL HIGH (ref 70–99)

## 2012-08-26 MED ORDER — WARFARIN SODIUM 10 MG PO TABS
10.0000 mg | ORAL_TABLET | Freq: Once | ORAL | Status: AC
  Administered 2012-08-26: 10 mg via ORAL
  Filled 2012-08-26: qty 1

## 2012-08-26 NOTE — Progress Notes (Signed)
Subjective:  Patient reports she's gradually feeling better. She still has marked exertional dyspnea. Denies chest pains or wheezing. She still feels dry. Her Lasix has been held. She is voiding at this time.   Allergies  Allergen Reactions  . Daptomycin Other (See Comments)    Elevated CPK  . Morphine And Related Nausea And Vomiting  . Cephalexin Rash    unknown  . Celecoxib     unknown  . Codeine     unknown  . Fluoxetine Hcl     unknown  . Latex     REACTION: undefined  . Ofloxacin     unknown  . Penicillins     unknown  . Rofecoxib     unknown  . Sulfonamide Derivatives     unknown   Current Facility-Administered Medications  Medication Dose Route Frequency Provider Last Rate Last Dose  . 0.9 %  sodium chloride infusion  250 mL Intravenous PRN Standley Brooking, MD 10 mL/hr at 08/15/12 2045 250 mL at 08/15/12 2045  . acetaminophen (TYLENOL) tablet 650 mg  650 mg Oral Q4H PRN Standley Brooking, MD   650 mg at 08/24/12 2001  . albuterol (PROVENTIL) (5 MG/ML) 0.5% nebulizer solution 2.5 mg  2.5 mg Nebulization Q4H PRN Gwenyth Bender, MD      . albuterol (PROVENTIL) (5 MG/ML) 0.5% nebulizer solution 2.5 mg  2.5 mg Nebulization BID Gwenyth Bender, MD   2.5 mg at 08/26/12 1959  . allopurinol (ZYLOPRIM) tablet 100 mg  100 mg Oral Daily Standley Brooking, MD   100 mg at 08/26/12 1010  . amiodarone (PACERONE) tablet 200 mg  200 mg Oral Daily Standley Brooking, MD   200 mg at 08/26/12 1010  . antiseptic oral rinse (BIOTENE) solution 15 mL  15 mL Mouth Rinse BID Gwenyth Bender, MD   15 mL at 08/26/12 2153  . aspirin chewable tablet 81 mg  81 mg Oral Daily Standley Brooking, MD   81 mg at 08/26/12 1011  . atorvastatin (LIPITOR) tablet 20 mg  20 mg Oral QHS Standley Brooking, MD   20 mg at 08/26/12 2154  . digoxin (LANOXIN) tablet 125 mcg  125 mcg Oral Daily Standley Brooking, MD   125 mcg at 08/25/12 [phone]  . hydrALAZINE (APRESOLINE) injection 5 mg  5 mg Intravenous Q6H PRN Standley Brooking,  MD   5 mg at 08/16/12 1505  . insulin aspart (novoLOG) injection 0-15 Units  0-15 Units Subcutaneous TID WC Standley Brooking, MD   2 Units at 08/26/12 1811  . insulin glargine (LANTUS) injection 22 Units  22 Units Subcutaneous BID Gwenyth Bender, MD   22 Units at 08/26/12 2153  . irbesartan (AVAPRO) tablet 150 mg  150 mg Oral Daily Standley Brooking, MD   150 mg at 08/26/12 1010  . levofloxacin (LEVAQUIN) IVPB 750 mg  750 mg Intravenous Q48H Gwenyth Bender, MD   750 mg at 08/25/12 2114  . ondansetron (ZOFRAN) injection 4 mg  4 mg Intravenous Q6H PRN Standley Brooking, MD   4 mg at 08/20/12 1220  . oxyCODONE-acetaminophen (PERCOCET/ROXICET) 5-325 MG per tablet 1 tablet  1 tablet Oral Q8H PRN Robynn Pane, MD   1 tablet at 08/19/12 2250  . pantoprazole (PROTONIX) EC tablet 40 mg  40 mg Oral Daily Standley Brooking, MD   40 mg at 08/26/12 1010  . polyethylene glycol (MIRALAX / GLYCOLAX) packet 17 g  17  g Oral BID Gwenyth Bender, MD   17 g at 08/26/12 2153  . sodium chloride 0.9 % injection 3 mL  3 mL Intravenous Q12H Standley Brooking, MD   3 mL at 08/26/12 2155  . sodium chloride 0.9 % injection 3 mL  3 mL Intravenous PRN Standley Brooking, MD   3 mL at 08/17/12 0232  . warfarin (COUMADIN) tablet 10 mg  10 mg Oral Once [name], PHARMD   10 mg at 08/26/12 1303  . Warfarin - Pharmacist Dosing Inpatient   Does not apply q1800 Berkley Harvey, PHARMD      . zolpidem [facility]) tablet 5 mg  5 mg Oral QHS PRN Standley Brooking, MD   5 mg at 08/22/12 2240    Objective: Blood pressure 172/39, pulse 67, temperature 98.2 F (36.8 C), temperature source Oral, resp. rate 18, height 5\' 3"  (1.6 m), weight 247 lb 4.8 oz (112.175 kg), SpO2 98.00%.  Well-developed obese black female presently in no acute distress. HEENT:no sinus tenderness. NECK:no posterior cervical nodes. LUNGS:distant breath sounds without wheezes. No vocal fremitus. RU:EAVWUJ S1, S2 without S3. WJX:BJYNW, nontender. GNF:AOZHYQMV  Homans. Skin turgor still decreased. NEURO:nonfocal.  Lab results: Results for orders placed during the hospital encounter of 08/15/12 (from the past 48 hour(s))  PROTIME-INR     Status: Abnormal   Collection Time   08/25/12  4:46 AM      Component Value Range Comment   Prothrombin Time 18.1 (*) 11.6 - 15.2 seconds    INR 1.47  0.00 - 1.49   COMPREHENSIVE METABOLIC PANEL     Status: Abnormal   Collection Time   08/25/12  4:46 AM      Component Value Range Comment   Sodium 138  135 - 145 mEq/L    Potassium 4.8  3.5 - 5.1 mEq/L    Chloride 101  96 - 112 mEq/L    CO2 27  19 - 32 mEq/L    Glucose, Bld 151 (*) 70 - 99 mg/dL    BUN 784 (*) 6 - 23 mg/dL    Creatinine, Ser 6.96 (*) 0.50 - 1.10 mg/dL    Calcium 8.8  8.4 - 29.5 mg/dL    Total Protein 6.5  6.0 - 8.3 g/dL    Albumin 2.9 (*) 3.5 - 5.2 g/dL    AST 11  0 - 37 U/L    ALT 9  0 - 35 U/L    Alkaline Phosphatase 74  39 - 117 U/L    Total Bilirubin 0.2 (*) 0.3 - 1.2 mg/dL    GFR calc non Af Amer 16 (*) >90 mL/min    GFR calc Af Amer 19 (*) >90 mL/min   CBC WITH DIFFERENTIAL     Status: Abnormal   Collection Time   08/25/12  4:46 AM      Component Value Range Comment   WBC 9.8  4.0 - 10.5 K/uL    RBC 3.68 (*) 3.87 - 5.11 MIL/uL    Hemoglobin 8.9 (*) 12.0 - 15.0 g/dL    HCT 28.4 (*) 13.2 - 46.0 %    MCV 82.3  78.0 - 100.0 fL    MCH 24.2 (*) 26.0 - 34.0 pg    MCHC 29.4 (*) 30.0 - 36.0 g/dL    RDW 44.0 (*) 10.2 - 15.5 %    Platelets 228  150 - 400 K/uL    Neutrophils Relative 78 (*) 43 - 77 %  Neutro Abs 7.6  1.7 - 7.7 K/uL    Lymphocytes Relative 13  12 - 46 %    Lymphs Abs 1.3  0.7 - 4.0 K/uL    Monocytes Relative 7  3 - 12 %    Monocytes Absolute 0.7  0.1 - 1.0 K/uL    Eosinophils Relative 3  0 - 5 %    Eosinophils Absolute 0.2  0.0 - 0.7 K/uL    Basophils Relative 0  0 - 1 %    Basophils Absolute 0.0  0.0 - 0.1 K/uL   GLUCOSE, CAPILLARY     Status: Abnormal   Collection Time   08/25/12  7:40 AM      Component Value  Range Comment   Glucose-Capillary 135 (*) 70 - 99 mg/dL   GLUCOSE, CAPILLARY     Status: Abnormal   Collection Time   08/25/12 11:12 AM      Component Value Range Comment   Glucose-Capillary 232 (*) 70 - 99 mg/dL   GLUCOSE, CAPILLARY     Status: Abnormal   Collection Time   08/25/12  5:29 PM      Component Value Range Comment   Glucose-Capillary 128 (*) 70 - 99 mg/dL   GLUCOSE, CAPILLARY     Status: Abnormal   Collection Time   08/25/12 10:50 PM      Component Value Range Comment   Glucose-Capillary 229 (*) 70 - 99 mg/dL    Comment 1 Notify RN     PROTIME-INR     Status: Abnormal   Collection Time   08/26/12  4:46 AM      Component Value Range Comment   Prothrombin Time 19.2 (*) 11.6 - 15.2 seconds    INR 1.68 (*) 0.00 - 1.49   COMPREHENSIVE METABOLIC PANEL     Status: Abnormal   Collection Time   08/26/12  4:46 AM      Component Value Range Comment   Sodium 138  135 - 145 mEq/L    Potassium 5.0  3.5 - 5.1 mEq/L    Chloride 103  96 - 112 mEq/L    CO2 25  19 - 32 mEq/L    Glucose, Bld 131 (*) 70 - 99 mg/dL    BUN 85 (*) 6 - 23 mg/dL    Creatinine, Ser 9.81 (*) 0.50 - 1.10 mg/dL    Calcium 8.9  8.4 - 19.1 mg/dL    Total Protein 6.2  6.0 - 8.3 g/dL    Albumin 2.7 (*) 3.5 - 5.2 g/dL    AST 12  0 - 37 U/L    ALT 11  0 - 35 U/L    Alkaline Phosphatase 73  39 - 117 U/L    Total Bilirubin 0.2 (*) 0.3 - 1.2 mg/dL    GFR calc non Af Amer 19 (*) >90 mL/min    GFR calc Af Amer 22 (*) >90 mL/min   CBC WITH DIFFERENTIAL     Status: Abnormal   Collection Time   08/26/12  4:46 AM      Component Value Range Comment   WBC 9.2  4.0 - 10.5 K/uL    RBC 3.50 (*) 3.87 - 5.11 MIL/uL    Hemoglobin 8.5 (*) 12.0 - 15.0 g/dL    HCT 47.8 (*) 29.5 - 46.0 %    MCV 83.1  78.0 - 100.0 fL    MCH 24.3 (*) 26.0 - 34.0 pg    MCHC 29.2 (*) 30.0 - 36.0 g/dL  RDW 18.8 (*) 11.5 - 15.5 %    Platelets 208  150 - 400 K/uL    Neutrophils Relative 77  43 - 77 %    Neutro Abs 7.0  1.7 - 7.7 K/uL     Lymphocytes Relative 10 (*) 12 - 46 %    Lymphs Abs 0.9  0.7 - 4.0 K/uL    Monocytes Relative 11  3 - 12 %    Monocytes Absolute 1.0  0.1 - 1.0 K/uL    Eosinophils Relative 2  0 - 5 %    Eosinophils Absolute 0.2  0.0 - 0.7 K/uL    Basophils Relative 0  0 - 1 %    Basophils Absolute 0.0  0.0 - 0.1 K/uL   GLUCOSE, CAPILLARY     Status: Abnormal   Collection Time   08/26/12  7:34 AM      Component Value Range Comment   Glucose-Capillary 135 (*) 70 - 99 mg/dL   GLUCOSE, CAPILLARY     Status: Abnormal   Collection Time   08/26/12 12:17 PM      Component Value Range Comment   Glucose-Capillary 200 (*) 70 - 99 mg/dL   GLUCOSE, CAPILLARY     Status: Abnormal   Collection Time   08/26/12  5:17 PM      Component Value Range Comment   Glucose-Capillary 144 (*) 70 - 99 mg/dL   GLUCOSE, CAPILLARY     Status: Abnormal   Collection Time   08/26/12  9:01 PM      Component Value Range Comment   Glucose-Capillary 154 (*) 70 - 99 mg/dL     Studies/Results: No results found.  Patient Active Problem List  Diagnosis  . HYPERLIPIDEMIA  . OBSTRUCTIVE SLEEP APNEA  . HYPERTENSION  . ATRIAL FIBRILLATION WITH RAPID VENTRICULAR RESPONSE  . Acute combined systolic and diastolic heart failure  . 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 BRONCHITIS, RECURRENT  . ASTHMA  . Obesity hypoventilation syndrome  . Prosthetic joint infection  . Group B streptococcal infection  . Gout  . Yeast infection involving the vagina and surrounding area  . SBO (small bowel obstruction)  . DM (diabetes mellitus)  . Chronic anticoagulation  . Oral candidiasis  . Laceration of leg  . Chronic venous insufficiency  . Acute respiratory failure  . Acute diastolic CHF (congestive heart failure)  . Acute asthma exacerbation  . Hypertensive urgency  . CKD (chronic kidney disease), stage III  . Anemia of chronic disease    Impression: Status post acute respiratory insufficiency. Multifactorial. Acute asthma resolved. Systolic and diastolic  congestive heart failure presently compensated Prerenal azotemia with transient uremic symptoms, improved. IV Lasix and p.o. Lasix continues to be held. Diabetes mellitus.blood sugars gradually decreasing. Sleep apnea. Morbid obesity. 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 Coumadin therapy, presently subtherapeutic.    Plan: Continue PT and OT. Followup CMET and CBC. Restart oral Lasix as indicated. Adjust Lantus insulin to avoid hypoglycemia as well.     [name], River Mckercher 08/26/2012 10:36 PM

## 2012-08-26 NOTE — Progress Notes (Signed)
Physical Therapy Treatment Patient Details Name: [name] MRN: [id number] DOB: 04-10-1951 Today's Date: 08/26/2012 Time: 1324-4010 PT Time Calculation (min): 17 min  PT Assessment / Plan / Recommendation Comments on Treatment Session  Pt able to increase ambulation distance a little in hallway however too fatigued after ambulation to perform exercises so educated and demonstrated ankle pumps, LAQ, and hip flexion seated x10 to pain tolerance (mostly for L LE).  Pt also demonstrated x2 each exercises to ensure performed correctly.    Follow Up Recommendations  Home health PT    Barriers to Discharge        Equipment Recommendations  3 in 1 bedside comode    Recommendations for Other Services    Frequency     Plan Discharge plan remains appropriate;Frequency remains appropriate    Precautions / Restrictions Precautions Precautions: Fall   Pertinent Vitals/Pain 3/10 L knee pain with increased activity, repositioned, LEs elevated and pt to relax and rest and reports pain will decrease    Mobility  Transfers Transfers: Stand to Sit;Sit to Stand Sit to Stand: 4: Min guard;With upper extremity assist;From chair/3-in-1;From toilet;With armrests Stand to Sit: 4: Min guard;With upper extremity assist;To chair/3-in-1;To toilet Details for Transfer Assistance: verbal cues for safe technique, used grab bar for low toilet. Ambulation/Gait Ambulation/Gait Assistance: 4: Min guard;5: Supervision Ambulation Distance (Feet): 160 Feet Assistive device: Rolling walker Ambulation/Gait Assistance Details: pt did well with RW however required verbal cues for taking 3 standing rest breaks due to slight SOB Gait Pattern: Step-through pattern;Decreased stride length Gait velocity: decreased    Exercises     PT Diagnosis:    PT Problem List:   PT Treatment Interventions:     PT Goals Acute Rehab PT Goals PT Goal: Sit to Stand - Progress: Progressing toward goal PT Goal: Ambulate -  Progress: Progressing toward goal  Visit Information  Last PT Received On: 08/26/12 Assistance Needed: +1    Subjective Data  Subjective: Can I use the bathroom first?   Cognition  Overall Cognitive Status: Appears within functional limits for tasks assessed/performed    Balance     End of Session PT - End of Session Activity Tolerance: Patient limited by fatigue Patient left: in chair;with call bell/phone within reach   GP     [name],[name] 08/26/2012, 11:56 AM Pager: [phone]

## 2012-08-26 NOTE — Progress Notes (Signed)
Inpatient Diabetes Program Recommendations  AACE/ADA: New Consensus Statement on Inpatient Glycemic Control (2013)  Target Ranges:  Prepandial:   less than 140 mg/dL      Peak postprandial:   less than 180 mg/dL (1-2 hours)      Critically ill patients:  140 - 180 mg/dL   Reason for Visit: Hyperglycemia  Results for [name], [name] (MRN [id number]) as of 08/26/2012 14:25  Ref. Range 08/25/2012 11:12 08/25/2012 17:29 08/25/2012 22:50 08/26/2012 07:34 08/26/2012 12:17  Glucose-Capillary Latest Range: 70-99 mg/dL 409 (H) 811 (H) 914 (H) 135 (H) 200 (H)  Recommendations:  Please consider meal coverage insulin for improved glycemic control - Add Novolog 3 units tidwc if pt eats > 50% meal.  Will follow.  Thank you.  Ailene Ards, RD, LDN, CDE Inpatient Diabetes Coordinator [phone]

## 2012-08-26 NOTE — Progress Notes (Signed)
Checked with patient about wearing nocturnal CPAP tonight. She states she is not ready at this time. She agrees to notify RN when she is ready for bed.

## 2012-08-26 NOTE — Progress Notes (Signed)
ANTICOAGULATION CONSULT NOTE - Follow up  Pharmacy Consult for warfarin Indication: atrial fibrillation  Allergies  Allergen Reactions  . Daptomycin Other (See Comments)    Elevated CPK  . Morphine And Related Nausea And Vomiting  . Cephalexin Rash    unknown  . Celecoxib     unknown  . Codeine     unknown  . Fluoxetine Hcl     unknown  . Latex     REACTION: undefined  . Ofloxacin     unknown  . Penicillins     unknown  . Rofecoxib     unknown  . Sulfonamide Derivatives     unknown   Patient Measurements: Height: 5\' 3"  (160 cm) Weight: 247 lb 4.8 oz (112.175 kg) IBW/kg (Calculated) : 52.4   Vital Signs: Temp: 97.8 F (36.6 C) (09/18 0504) Temp src: Oral (09/18 0504) BP: 171/45 mmHg (09/18 0504) Pulse Rate: 55  (09/18 0504)  Labs:  Basename 08/26/12 0446 08/25/12 0446 08/24/12 0537  HGB 8.5* 8.9* --  HCT 29.1* 30.3* 29.9*  PLT 208 228 206  APTT -- -- --  LABPROT 19.2* 18.1* 19.9*  INR 1.68* 1.47 1.66*  HEPARINUNFRC -- -- --  CREATININE 2.57* 2.89* 3.18*  CKTOTAL -- -- --  CKMB -- -- --  TROPONINI -- -- --   Estimated Creatinine Clearance: 27.3 ml/min (by C-G formula based on Cr of 2.57).  Assessment:  61 yo female admitted with CC wheezing to continue warfarin for hx afib per pharmacy dosing.  Patient was reportedly on 6mg  daily PTA with last dose on 9/6.    INR responding to increased doses, but remains subtherapeutic   H/H low but stable.  Some hematuria reported this admission.   Potential DDI with Levaquin, amiodarone, and digoxin noted.  Goal of Therapy:  INR 2-3   Plan:   Repeat Coumadin 10mg  x 1 today at noon.  Daily PT/INR.  Loralee Pacas, PharmD, BCPS Pager: [phone]  08/26/2012,8:13 AM.

## 2012-08-26 NOTE — Progress Notes (Signed)
Placed pt on CPAP of 7.0 via nasal mask per her home settings. Pt tolerating well at this time. RT to continue to monitor.

## 2012-08-27 LAB — GLUCOSE, CAPILLARY: Glucose-Capillary: 115 mg/dL — ABNORMAL HIGH (ref 70–99)

## 2012-08-27 LAB — PROTIME-INR: INR: 1.81 — ABNORMAL HIGH (ref 0.00–1.49)

## 2012-08-27 LAB — CBC WITH DIFFERENTIAL/PLATELET
Basophils Relative: 0 % (ref 0–1)
Eosinophils Absolute: 0.1 10*3/uL (ref 0.0–0.7)
HCT: 29.1 % — ABNORMAL LOW (ref 36.0–46.0)
Hemoglobin: 8.6 g/dL — ABNORMAL LOW (ref 12.0–15.0)
MCH: 24.4 pg — ABNORMAL LOW (ref 26.0–34.0)
MCHC: 29.6 g/dL — ABNORMAL LOW (ref 30.0–36.0)
Monocytes Absolute: 0.5 10*3/uL (ref 0.1–1.0)
Monocytes Relative: 7 % (ref 3–12)
RDW: 18.8 % — ABNORMAL HIGH (ref 11.5–15.5)

## 2012-08-27 LAB — COMPREHENSIVE METABOLIC PANEL
ALT: 13 U/L (ref 0–35)
AST: 14 U/L (ref 0–37)
Alkaline Phosphatase: 76 U/L (ref 39–117)
CO2: 23 mEq/L (ref 19–32)
Calcium: 8.8 mg/dL (ref 8.4–10.5)
GFR calc non Af Amer: 20 mL/min — ABNORMAL LOW (ref >90)
Potassium: 4.7 mEq/L (ref 3.5–5.1)
Sodium: 138 mEq/L (ref 135–145)
Total Protein: 6.6 g/dL (ref 6.0–8.3)

## 2012-08-27 MED ORDER — WARFARIN SODIUM 10 MG PO TABS
10.0000 mg | ORAL_TABLET | Freq: Once | ORAL | Status: AC
  Administered 2012-08-27: 10 mg via ORAL
  Filled 2012-08-27: qty 1

## 2012-08-27 MED ORDER — LEVOFLOXACIN 750 MG PO TABS
750.0000 mg | ORAL_TABLET | ORAL | Status: DC
  Administered 2012-08-27: 750 mg via ORAL
  Filled 2012-08-27 (×2): qty 1

## 2012-08-27 MED ORDER — OXYCODONE-ACETAMINOPHEN 5-325 MG PO TABS
ORAL_TABLET | ORAL | Status: AC
Start: 2012-08-27 — End: 2012-08-27
  Filled 2012-08-27: qty 1

## 2012-08-27 NOTE — Progress Notes (Signed)
ANTICOAGULATION CONSULT NOTE - Follow up  Pharmacy Consult for warfarin Indication: atrial fibrillation  Allergies  Allergen Reactions  . Daptomycin Other (See Comments)    Elevated CPK  . Morphine And Related Nausea And Vomiting  . Cephalexin Rash    unknown  . Celecoxib     unknown  . Codeine     unknown  . Fluoxetine Hcl     unknown  . Latex     REACTION: undefined  . Ofloxacin     unknown  . Penicillins     unknown  . Rofecoxib     unknown  . Sulfonamide Derivatives     unknown   Patient Measurements: Height: 5\' 3"  (160 cm) Weight: 246 lb 14.6 oz (112 kg) (bed scale' patient didn't feel like getting on the st. scale) IBW/kg (Calculated) : 52.4   Vital Signs: Temp: 97.6 F (36.4 C) (09/19 0512) Temp src: Oral (09/19 0512) BP: 169/48 mmHg (09/19 0512) Pulse Rate: 56  (09/19 1050)  Labs:  Basename 08/27/12 0457 08/26/12 0446 08/25/12 0446  HGB 8.6* 8.5* --  HCT 29.1* 29.1* 30.3*  PLT 212 208 228  APTT -- -- --  LABPROT 20.3* 19.2* 18.1*  INR 1.81* 1.68* 1.47  HEPARINUNFRC -- -- --  CREATININE 2.48* 2.57* 2.89*  CKTOTAL -- -- --  CKMB -- -- --  TROPONINI -- -- --   Estimated Creatinine Clearance: 28.3 ml/min (by C-G formula based on Cr of 2.48).  Assessment:  61 yo female admitted with CC wheezing to continue warfarin for hx afib per pharmacy dosing.  Patient was reportedly on 6mg  daily PTA  INR responding to increased doses, but remains subtherapeutic   H/H low but stable.  Some hematuria reported this admission.   Potential DDI with Levaquin, amiodarone, and digoxin noted.  Goal of Therapy:  INR 2-3   Plan:   Repeat Coumadin 10mg  x 1 today at noon.  Daily PT/INR.  Loralee Pacas, PharmD, BCPS Pager: [phone]  08/27/2012,11:16 AM.

## 2012-08-27 NOTE — Progress Notes (Signed)
Patient noted to be asleep in the chair. Offered assistance with nocturnal CPAP at this time. Patient awakened and held appropriate conversation about routine nebulizer therapy, but refuses to wear CPAP. She is aware that she may notify staff at any time for assistance if she should change her mind. Equipment remains at the bedside, set up and ready for use.

## 2012-08-27 NOTE — Progress Notes (Signed)
Pt declined a walk in the hallway wanting to get her bath. NT in room. Felecia Shelling  PTA [facility]  Acute  Rehab Pager     [phone]

## 2012-08-27 NOTE — Progress Notes (Signed)
PHARMACIST - PHYSICIAN COMMUNICATION DR:  [name] CONCERNING: Antibiotic IV to Oral Route Change Policy  RECOMMENDATION: This patient is receiving Levaquin by the intravenous route.  Based on criteria approved by the Pharmacy and Therapeutics Committee, the antibiotic(s) is/are being converted to the equivalent oral dose form(s).   DESCRIPTION: These criteria include:  Patient being treated for a respiratory tract infection, urinary tract infection, or cellulitis  The patient is not neutropenic and does not exhibit a GI malabsorption state  The patient is eating (either orally or via tube) and/or has been taking other orally administered medications for a least 24 hours  The patient is improving clinically and has a Tmax < 100.5  If you have questions about this conversion, please contact the Pharmacy Department  []   [phone] )  [facility] []   [phone] )  [facility]  []   [phone] )  [facility] [x]   [phone] )  [facility]   Loralee Pacas, PharmD, BCPS 08/27/2012 1:30 PM

## 2012-08-28 LAB — FOLATE: Folate: 11 ng/mL

## 2012-08-28 LAB — COMPREHENSIVE METABOLIC PANEL
ALT: 14 U/L (ref 0–35)
AST: 12 U/L (ref 0–37)
Alkaline Phosphatase: 76 U/L (ref 39–117)
CO2: 24 mEq/L (ref 19–32)
Chloride: 107 mEq/L (ref 96–112)
GFR calc non Af Amer: 22 mL/min — ABNORMAL LOW (ref >90)
Potassium: 5.1 mEq/L (ref 3.5–5.1)
Sodium: 139 mEq/L (ref 135–145)
Total Bilirubin: 0.1 mg/dL — ABNORMAL LOW (ref 0.3–1.2)

## 2012-08-28 LAB — GLUCOSE, CAPILLARY
Glucose-Capillary: 152 mg/dL — ABNORMAL HIGH (ref 70–99)
Glucose-Capillary: 150 mg/dL — ABNORMAL HIGH (ref 70–99)

## 2012-08-28 LAB — CBC WITH DIFFERENTIAL/PLATELET
Basophils Relative: 0 % (ref 0–1)
Eosinophils Absolute: 0.1 10*3/uL (ref 0.0–0.7)
Hemoglobin: 8.4 g/dL — ABNORMAL LOW (ref 12.0–15.0)
MCH: 24.1 pg — ABNORMAL LOW (ref 26.0–34.0)
MCHC: 29.2 g/dL — ABNORMAL LOW (ref 30.0–36.0)
Monocytes Relative: 6 % (ref 3–12)
Neutrophils Relative %: 83 % — ABNORMAL HIGH (ref 43–77)
Platelets: 202 10*3/uL (ref 150–400)

## 2012-08-28 LAB — IRON AND TIBC
Saturation Ratios: 8 % — ABNORMAL LOW (ref 20–55)
TIBC: 299 ug/dL (ref 250–470)
UIBC: 276 ug/dL (ref 125–400)

## 2012-08-28 LAB — PROTIME-INR: INR: 2.25 — ABNORMAL HIGH (ref 0.00–1.49)

## 2012-08-28 LAB — VITAMIN B12: Vitamin B-12: 372 pg/mL (ref 211–911)

## 2012-08-28 MED ORDER — SENNOSIDES-DOCUSATE SODIUM 8.6-50 MG PO TABS
2.0000 | ORAL_TABLET | Freq: Every evening | ORAL | Status: DC | PRN
  Filled 2012-08-28: qty 2

## 2012-08-28 MED ORDER — WARFARIN SODIUM 6 MG PO TABS
6.0000 mg | ORAL_TABLET | Freq: Once | ORAL | Status: AC
  Administered 2012-08-28: 6 mg via ORAL
  Filled 2012-08-28: qty 1

## 2012-08-28 MED ORDER — FUROSEMIDE 40 MG PO TABS
40.0000 mg | ORAL_TABLET | Freq: Two times a day (BID) | ORAL | Status: DC
  Administered 2012-08-28 – 2012-08-29 (×2): 40 mg via ORAL
  Filled 2012-08-28 (×4): qty 1

## 2012-08-28 MED ORDER — FERROUS SULFATE 325 (65 FE) MG PO TABS
325.0000 mg | ORAL_TABLET | Freq: Three times a day (TID) | ORAL | Status: DC
  Administered 2012-08-28 – 2012-08-29 (×4): 325 mg via ORAL
  Filled 2012-08-28 (×7): qty 1

## 2012-08-28 NOTE — Progress Notes (Signed)
OT Cancellation Note  Treatment cancelled today due to patient's refusal to participate - pt. Angry about people asking her when she is discharging home.  Attempted to console pt. With no success.  RN notified.  Jeani Hawking M [phone] 08/28/2012, 4:07 PM

## 2012-08-28 NOTE — Progress Notes (Signed)
MEDICATION RELATED CONSULT NOTE  Pharmacy Consult for Darbepoetin Indication: Anemia of CKD/stage III  Allergies  Allergen Reactions  . Daptomycin Other (See Comments)    Elevated CPK  . Morphine And Related Nausea And Vomiting  . Cephalexin Rash    unknown  . Celecoxib     unknown  . Codeine     unknown  . Fluoxetine Hcl     unknown  . Latex     REACTION: undefined  . Ofloxacin     unknown  . Penicillins     unknown  . Rofecoxib     unknown  . Sulfonamide Derivatives     unknown    Patient Measurements: Height: 5\' 3"  (160 cm) Weight: 248 lb 3.2 oz (112.583 kg) IBW/kg (Calculated) : 52.4   Vital Signs: Temp: 97.8 F (36.6 C) (09/20 0531) Temp src: Oral (09/20 0531) BP: 146/37 mmHg (09/20 0531) Pulse Rate: 54  (09/20 0531) Intake/Output from previous day: 09/19 0701 - 09/20 0700 In: 1000 [P.O.:1000] Out: 2050 [Urine:2050] Intake/Output from this shift: Total I/O In: 320 [P.O.:320] Out: -   Labs:  Basename 08/28/12 0420 08/27/12 0457 08/26/12 0446  WBC 8.4 7.7 9.2  HGB 8.4* 8.6* 8.5*  HCT 28.8* 29.1* 29.1*  PLT 202 212 208  APTT -- -- --  CREATININE 2.29* 2.48* 2.57*  LABCREA -- -- --  CREATININE 2.29* 2.48* 2.57*  CREAT24HRUR -- -- --  MG -- -- --  PHOS -- -- --  ALBUMIN 2.6* 2.8* 2.7*  PROT 6.3 6.6 6.2  ALBUMIN 2.6* 2.8* 2.7*  AST 12 14 12   ALT 14 13 11   ALKPHOS 76 76 73  BILITOT 0.1* 0.2* 0.2*  BILIDIR -- -- --  IBILI -- -- --   Estimated Creatinine Clearance: 30.8 ml/min (by C-G formula based on Cr of 2.29).   Microbiology: Recent Results (from the past 720 hour(s))  MRSA PCR SCREENING     Status: Normal   Collection Time   08/15/12  7:12 PM      Component Value Range Status Comment   MRSA by PCR NEGATIVE  NEGATIVE Final     Medical History: Past Medical History  Diagnosis Date  . Asthma   . Bronchitis   . Allergic rhinitis   . Diabetes mellitus   . Stroke   . HTN (hypertension)   . OSA (obstructive sleep apnea)   . DJD  (degenerative joint disease)   . Kidney stones   . Depression   . Hyperlipidemia   . Complication of anesthesia     trouble waking up  . CHF (congestive heart failure)     Medications:  Scheduled:     . albuterol  2.5 mg Nebulization BID  . allopurinol  100 mg Oral Daily  . amiodarone  200 mg Oral Daily  . antiseptic oral rinse  15 mL Mouth Rinse BID  . aspirin  81 mg Oral Daily  . atorvastatin  20 mg Oral QHS  . digoxin  125 mcg Oral Daily  . ferrous sulfate  325 mg Oral TID WC  . insulin aspart  0-15 Units Subcutaneous TID WC  . insulin glargine  22 Units Subcutaneous BID  . irbesartan  150 mg Oral Daily  . levofloxacin  750 mg Oral Q48H  . oxyCODONE-acetaminophen      . pantoprazole  40 mg Oral Daily  . polyethylene glycol  17 g Oral BID  . sodium chloride  3 mL Intravenous Q12H  . warfarin  10 mg  Oral Once  . Warfarin - Pharmacist Dosing Inpatient   Does not apply q1800  . DISCONTD: levofloxacin (LEVAQUIN) IV  750 mg Intravenous Q48H    Assessment:  Darbepoetin ordered for anemia of CKD.  Recommended dose = 0.45 mcg/kg q 4 wk (patient not on dialysis)  Goal of Therapy:  Hgb > 10  Plan:   Has already received Darbepoetin 60 mcg on 9/12, therefore next dose will not be due for another month  Monitor CBC as outpatient, hold for Hgb > 10  Loralee Pacas, PharmD, BCPS Pager: [phone] 08/28/2012,8:47 AM

## 2012-08-28 NOTE — Progress Notes (Signed)
Subjective:  Patient continues to have exertional dyspnea but is improving. She has not had any flare up of asthma. Denies chest pains. She does complain of pain in her lower legs with ambulation no other new complaints. Patient understands that she will need further physical therapy as an outpatient.   Allergies  Allergen Reactions  . Daptomycin Other (See Comments)    Elevated CPK  . Morphine And Related Nausea And Vomiting  . Cephalexin Rash    unknown  . Celecoxib     unknown  . Codeine     unknown  . Fluoxetine Hcl     unknown  . Latex     REACTION: undefined  . Ofloxacin     unknown  . Penicillins     unknown  . Rofecoxib     unknown  . Sulfonamide Derivatives     unknown   Current Facility-Administered Medications  Medication Dose Route Frequency Provider Last Rate Last Dose  . 0.9 %  sodium chloride infusion  250 mL Intravenous PRN Standley Brooking, MD 10 mL/hr at 08/15/12 2045 250 mL at 08/15/12 2045  . acetaminophen (TYLENOL) tablet 650 mg  650 mg Oral Q4H PRN Standley Brooking, MD   650 mg at 08/24/12 2001  . albuterol (PROVENTIL) (5 MG/ML) 0.5% nebulizer solution 2.5 mg  2.5 mg Nebulization Q4H PRN Gwenyth Bender, MD      . albuterol (PROVENTIL) (5 MG/ML) 0.5% nebulizer solution 2.5 mg  2.5 mg Nebulization BID Gwenyth Bender, MD   2.5 mg at 08/27/12 2009  . allopurinol (ZYLOPRIM) tablet 100 mg  100 mg Oral Daily Standley Brooking, MD   100 mg at 08/27/12 1049  . amiodarone (PACERONE) tablet 200 mg  200 mg Oral Daily Standley Brooking, MD   200 mg at 08/27/12 1049  . antiseptic oral rinse (BIOTENE) solution 15 mL  15 mL Mouth Rinse BID Gwenyth Bender, MD   15 mL at 08/27/12 2000  . aspirin chewable tablet 81 mg  81 mg Oral Daily Standley Brooking, MD   81 mg at 08/27/12 1050  . atorvastatin (LIPITOR) tablet 20 mg  20 mg Oral QHS Standley Brooking, MD   20 mg at 08/27/12 2217  . digoxin (LANOXIN) tablet 125 mcg  125 mcg Oral Daily Standley Brooking, MD   125 mcg at 08/25/12  [phone]  . hydrALAZINE (APRESOLINE) injection 5 mg  5 mg Intravenous Q6H PRN Standley Brooking, MD   5 mg at 08/16/12 1505  . insulin aspart (novoLOG) injection 0-15 Units  0-15 Units Subcutaneous TID WC Standley Brooking, MD   2 Units at 08/27/12 1708  . insulin glargine (LANTUS) injection 22 Units  22 Units Subcutaneous BID Gwenyth Bender, MD   22 Units at 08/27/12 2200  . irbesartan (AVAPRO) tablet 150 mg  150 mg Oral Daily Standley Brooking, MD   150 mg at 08/27/12 1050  . levofloxacin (LEVAQUIN) tablet 750 mg  750 mg Oral Q48H [name], PHARMD   750 mg at 08/27/12 2216  . ondansetron (ZOFRAN) injection 4 mg  4 mg Intravenous Q6H PRN Standley Brooking, MD   4 mg at 08/20/12 1220  . oxyCODONE-acetaminophen (PERCOCET/ROXICET) 5-325 MG per tablet 1 tablet  1 tablet Oral Q8H PRN Robynn Pane, MD   1 tablet at 08/27/12 0050  . oxyCODONE-acetaminophen (PERCOCET/ROXICET) 5-325 MG per tablet           . pantoprazole (  PROTONIX) EC tablet 40 mg  40 mg Oral Daily Standley Brooking, MD   40 mg at 08/27/12 1051  . polyethylene glycol (MIRALAX / GLYCOLAX) packet 17 g  17 g Oral BID Gwenyth Bender, MD   17 g at 08/27/12 1049  . sodium chloride 0.9 % injection 3 mL  3 mL Intravenous Q12H Standley Brooking, MD   3 mL at 08/27/12 2217  . sodium chloride 0.9 % injection 3 mL  3 mL Intravenous PRN Standley Brooking, MD   3 mL at 08/17/12 0232  . warfarin (COUMADIN) tablet 10 mg  10 mg Oral Once [name], PHARMD   10 mg at 08/27/12 1431  . Warfarin - Pharmacist Dosing Inpatient   Does not apply q1800 Berkley Harvey, PHARMD      . zolpidem [facility]) tablet 5 mg  5 mg Oral QHS PRN Standley Brooking, MD   5 mg at 08/22/12 2240  . DISCONTD: levofloxacin (LEVAQUIN) IVPB 750 mg  750 mg Intravenous Q48H Gwenyth Bender, MD   750 mg at 08/25/12 2114    Objective: Blood pressure 162/69, pulse 64, temperature 98 F (36.7 C), temperature source Oral, resp. rate 20, height 5\' 3"  (1.6 m), weight 246 lb 14.6 oz  (112 kg), SpO2 99.00%.  Well-developed obese black female in no acute distress. HEENT:no sinus tenderness. NECK:no posterior cervical nodes. No JVD at 60. LUNGS:clear to auscultation. No wheezes. No rales appreciated. RU:EAVWUJ S1, S2 without S3. WJX:BJYNW, nontender. GNF:AOZHYQMV Homans. No edema. Chronic tenderness in left knee. NEURO:nonfocal.  Lab results: Results for orders placed during the hospital encounter of 08/15/12 (from the past 48 hour(s))  PROTIME-INR     Status: Abnormal   Collection Time   08/26/12  4:46 AM      Component Value Range Comment   Prothrombin Time 19.2 (*) 11.6 - 15.2 seconds    INR 1.68 (*) 0.00 - 1.49   COMPREHENSIVE METABOLIC PANEL     Status: Abnormal   Collection Time   08/26/12  4:46 AM      Component Value Range Comment   Sodium 138  135 - 145 mEq/L    Potassium 5.0  3.5 - 5.1 mEq/L    Chloride 103  96 - 112 mEq/L    CO2 25  19 - 32 mEq/L    Glucose, Bld 131 (*) 70 - 99 mg/dL    BUN 85 (*) 6 - 23 mg/dL    Creatinine, Ser 7.84 (*) 0.50 - 1.10 mg/dL    Calcium 8.9  8.4 - 69.6 mg/dL    Total Protein 6.2  6.0 - 8.3 g/dL    Albumin 2.7 (*) 3.5 - 5.2 g/dL    AST 12  0 - 37 U/L    ALT 11  0 - 35 U/L    Alkaline Phosphatase 73  39 - 117 U/L    Total Bilirubin 0.2 (*) 0.3 - 1.2 mg/dL    GFR calc non Af Amer 19 (*) >90 mL/min    GFR calc Af Amer 22 (*) >90 mL/min   CBC WITH DIFFERENTIAL     Status: Abnormal   Collection Time   08/26/12  4:46 AM      Component Value Range Comment   WBC 9.2  4.0 - 10.5 K/uL    RBC 3.50 (*) 3.87 - 5.11 MIL/uL    Hemoglobin 8.5 (*) 12.0 - 15.0 g/dL    HCT 29.5 (*) 28.4 - 46.0 %  MCV 83.1  78.0 - 100.0 fL    MCH 24.3 (*) 26.0 - 34.0 pg    MCHC 29.2 (*) 30.0 - 36.0 g/dL    RDW 40.9 (*) 81.1 - 15.5 %    Platelets 208  150 - 400 K/uL    Neutrophils Relative 77  43 - 77 %    Neutro Abs 7.0  1.7 - 7.7 K/uL    Lymphocytes Relative 10 (*) 12 - 46 %    Lymphs Abs 0.9  0.7 - 4.0 K/uL    Monocytes Relative 11  3 -  12 %    Monocytes Absolute 1.0  0.1 - 1.0 K/uL    Eosinophils Relative 2  0 - 5 %    Eosinophils Absolute 0.2  0.0 - 0.7 K/uL    Basophils Relative 0  0 - 1 %    Basophils Absolute 0.0  0.0 - 0.1 K/uL   GLUCOSE, CAPILLARY     Status: Abnormal   Collection Time   08/26/12  7:34 AM      Component Value Range Comment   Glucose-Capillary 135 (*) 70 - 99 mg/dL   GLUCOSE, CAPILLARY     Status: Abnormal   Collection Time   08/26/12 12:17 PM      Component Value Range Comment   Glucose-Capillary 200 (*) 70 - 99 mg/dL   GLUCOSE, CAPILLARY     Status: Abnormal   Collection Time   08/26/12  5:17 PM      Component Value Range Comment   Glucose-Capillary 144 (*) 70 - 99 mg/dL   GLUCOSE, CAPILLARY     Status: Abnormal   Collection Time   08/26/12  9:01 PM      Component Value Range Comment   Glucose-Capillary 154 (*) 70 - 99 mg/dL   PROTIME-INR     Status: Abnormal   Collection Time   08/27/12  4:57 AM      Component Value Range Comment   Prothrombin Time 20.3 (*) 11.6 - 15.2 seconds    INR 1.81 (*) 0.00 - 1.49   COMPREHENSIVE METABOLIC PANEL     Status: Abnormal   Collection Time   08/27/12  4:57 AM      Component Value Range Comment   Sodium 138  135 - 145 mEq/L    Potassium 4.7  3.5 - 5.1 mEq/L    Chloride 104  96 - 112 mEq/L    CO2 23  19 - 32 mEq/L    Glucose, Bld 143 (*) 70 - 99 mg/dL    BUN 72 (*) 6 - 23 mg/dL    Creatinine, Ser 9.14 (*) 0.50 - 1.10 mg/dL    Calcium 8.8  8.4 - 78.2 mg/dL    Total Protein 6.6  6.0 - 8.3 g/dL    Albumin 2.8 (*) 3.5 - 5.2 g/dL    AST 14  0 - 37 U/L    ALT 13  0 - 35 U/L    Alkaline Phosphatase 76  39 - 117 U/L    Total Bilirubin 0.2 (*) 0.3 - 1.2 mg/dL    GFR calc non Af Amer 20 (*) >90 mL/min    GFR calc Af Amer 23 (*) >90 mL/min   CBC WITH DIFFERENTIAL     Status: Abnormal   Collection Time   08/27/12  4:57 AM      Component Value Range Comment   WBC 7.7  4.0 - 10.5 K/uL    RBC 3.52 (*) 3.87 -  5.11 MIL/uL    Hemoglobin 8.6 (*) 12.0 - 15.0  g/dL    HCT 13.0 (*) 86.5 - 46.0 %    MCV 82.7  78.0 - 100.0 fL    MCH 24.4 (*) 26.0 - 34.0 pg    MCHC 29.6 (*) 30.0 - 36.0 g/dL    RDW 78.4 (*) 69.6 - 15.5 %    Platelets 212  150 - 400 K/uL    Neutrophils Relative 78 (*) 43 - 77 %    Neutro Abs 6.0  1.7 - 7.7 K/uL    Lymphocytes Relative 14  12 - 46 %    Lymphs Abs 1.1  0.7 - 4.0 K/uL    Monocytes Relative 7  3 - 12 %    Monocytes Absolute 0.5  0.1 - 1.0 K/uL    Eosinophils Relative 2  0 - 5 %    Eosinophils Absolute 0.1  0.0 - 0.7 K/uL    Basophils Relative 0  0 - 1 %    Basophils Absolute 0.0  0.0 - 0.1 K/uL   GLUCOSE, CAPILLARY     Status: Abnormal   Collection Time   08/27/12  7:25 AM      Component Value Range Comment   Glucose-Capillary 168 (*) 70 - 99 mg/dL   GLUCOSE, CAPILLARY     Status: Abnormal   Collection Time   08/27/12 11:32 AM      Component Value Range Comment   Glucose-Capillary 207 (*) 70 - 99 mg/dL   GLUCOSE, CAPILLARY     Status: Abnormal   Collection Time   08/27/12  4:48 PM      Component Value Range Comment   Glucose-Capillary 128 (*) 70 - 99 mg/dL   GLUCOSE, CAPILLARY     Status: Abnormal   Collection Time   08/27/12  8:42 PM      Component Value Range Comment   Glucose-Capillary 115 (*) 70 - 99 mg/dL    Comment 1 Documented in Chart      Comment 2 Notify RN       Studies/Results: No results found.  Patient Active Problem List  Diagnosis  . HYPERLIPIDEMIA  . OBSTRUCTIVE SLEEP APNEA  . HYPERTENSION  . ATRIAL FIBRILLATION WITH RAPID VENTRICULAR RESPONSE  . Acute combined systolic and diastolic heart failure  . 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 BRONCHITIS, RECURRENT  . ASTHMA  . Obesity hypoventilation syndrome  . Prosthetic joint infection  . Group B streptococcal infection  . Gout  . Yeast infection involving the vagina and surrounding area  . SBO (small bowel obstruction)  . DM (diabetes mellitus)  . Chronic anticoagulation  . Oral candidiasis  . Laceration of leg  . Chronic venous insufficiency    . Acute respiratory failure  . Acute diastolic CHF (congestive heart failure)  . Acute asthma exacerbation  . Hypertensive urgency  . CKD (chronic kidney disease), stage III  . Anemia of chronic disease    Impression: Acute on chronic congestive heart failure compensated. Patient is currently on a Lasix. This will need to be restarted. Acute asthma resolved. Prerenal azotemia improved. Chronic kidney disease stage III. Chronic Coumadin therapy for fibrillation. INR is near goal. Diabetes mellitus. Recent exacerbation with steroid therapy. Improving. Morbid obesity. Sleep apnea. Patient has improved compliance while hospitalized. Anemia chronic disease. This will contribute to her exertion dyspnea. Patient should qualify for Aranesp.   Plan: Restart oral Lasix. Begin Aranesp per pharmacy protocol. Discharge home when INR at goal. Outpatient  followup. Home PT.   [name], Athanasia Stanwood 08/27/2012, 11:00 PM

## 2012-08-28 NOTE — Progress Notes (Signed)
Physical Therapy Treatment Patient Details Name: [name] MRN: [id number] DOB: Dec 20, 1950 Today's Date: 08/28/2012 Time: 4098-1191 PT Time Calculation (min): 24 min  PT Assessment / Plan / Recommendation Comments on Treatment Session  Progressing with mobility. Initiated LE exercises this session. Pt states plan is for home. Recommend HHPT.    Follow Up Recommendations  Home health PT    Barriers to Discharge        Equipment Recommendations       Recommendations for Other Services    Frequency Min 3X/week   Plan Discharge plan remains appropriate    Precautions / Restrictions Precautions Precautions: Fall Restrictions Weight Bearing Restrictions: No   Pertinent Vitals/Pain Minimal pain L anterior knee    Mobility  Bed Mobility Bed Mobility: Not assessed Details for Bed Mobility Assistance: Pt sitting in recliner Transfers Transfers: Sit to Stand;Stand to Sit Sit to Stand: 5: Supervision;From chair/3-in-1;From toilet Stand to Sit: 5: Supervision;To chair/3-in-1;To toilet;With upper extremity assist Details for Transfer Assistance: VCs safety. Pt utilized armrests/grabbar to aid in rising, descent to sitting surface.  Ambulation/Gait Ambulation/Gait Assistance: 4: Min guard Ambulation Distance (Feet): 130 Feet Assistive device: Rolling walker Ambulation/Gait Assistance Details: 1 short standing rest break. slightly decreased gait speed. No LOB Gait Pattern: Step-through pattern;Decreased stride length    Exercises General Exercises - Lower Extremity Ankle Circles/Pumps: AROM;Both;Seated Quad Sets: AROM;Both;10 reps;Seated Long Arc Quad: AROM;Both;10 reps;Seated Heel Slides: AROM;AAROM;Both;10 reps;Supine Hip ABduction/ADduction: AROM;AAROM;Both;10 reps;Supine   PT Diagnosis:    PT Problem List:   PT Treatment Interventions:     PT Goals Acute Rehab PT Goals Pt will go Sit to Stand: with modified independence PT Goal: Sit to Stand - Progress:  Progressing toward goal Pt will Ambulate: 51 - 150 feet;with modified independence PT Goal: Ambulate - Progress: Progressing toward goal  Visit Information  Last PT Received On: 08/28/12 Assistance Needed: +1    Subjective Data  Subjective: "Its cold in here" Patient Stated Goal: Home   Cognition  Overall Cognitive Status: Appears within functional limits for tasks assessed/performed Arousal/Alertness: Awake/alert Orientation Level: Appears intact for tasks assessed Behavior During Session: [facility] for tasks performed    Balance     End of Session PT - End of Session Equipment Utilized During Treatment: Gait belt Activity Tolerance: Patient tolerated treatment well Patient left: in chair;with call bell/phone within reach   GP     Rebeca Alert [place] 08/28/2012, 10:30 AM [phone]

## 2012-08-28 NOTE — Progress Notes (Signed)
ANTICOAGULATION CONSULT NOTE - Follow up  Pharmacy Consult for warfarin Indication: atrial fibrillation  Allergies  Allergen Reactions  . Daptomycin Other (See Comments)    Elevated CPK  . Morphine And Related Nausea And Vomiting  . Cephalexin Rash    unknown  . Celecoxib     unknown  . Codeine     unknown  . Fluoxetine Hcl     unknown  . Latex     REACTION: undefined  . Ofloxacin     unknown  . Penicillins     unknown  . Rofecoxib     unknown  . Sulfonamide Derivatives     unknown   Patient Measurements: Height: 5\' 3"  (160 cm) Weight: 248 lb 3.2 oz (112.583 kg) IBW/kg (Calculated) : 52.4   Vital Signs: Temp: 97.8 F (36.6 C) (09/20 0531) Temp src: Oral (09/20 0531) BP: 146/37 mmHg (09/20 0531) Pulse Rate: 54  (09/20 0531)  Labs:  Basename 08/28/12 0420 08/27/12 0457 08/26/12 0446  HGB 8.4* 8.6* --  HCT 28.8* 29.1* 29.1*  PLT 202 212 208  APTT -- -- --  LABPROT 23.9* 20.3* 19.2*  INR 2.25* 1.81* 1.68*  HEPARINUNFRC -- -- --  CREATININE 2.29* 2.48* 2.57*  CKTOTAL -- -- --  CKMB -- -- --  TROPONINI -- -- --   Estimated Creatinine Clearance: 30.8 ml/min (by C-G formula based on Cr of 2.29).  Assessment:  61 yo female admitted with CC wheezing to continue warfarin for hx afib per pharmacy dosing.  Patient was reportedly on 6mg  daily PTA  INR responding to increased doses, now therapeutic   H/H low but stable.  Darbopoetin given 9/12. Some hematuria reported earlier this admission.   Potential DDI with Levaquin, amiodarone, and digoxin noted.  Goal of Therapy:  INR 2-3   Plan:   Return to home dose of 6mg  today  Daily PT/INR.  Loralee Pacas, PharmD, BCPS Pager: [phone] 08/28/2012,8:54 AM.

## 2012-08-29 LAB — PROTIME-INR
INR: 2.69 — ABNORMAL HIGH (ref 0.00–1.49)
Prothrombin Time: 27.3 seconds — ABNORMAL HIGH (ref 11.6–15.2)

## 2012-08-29 LAB — COMPREHENSIVE METABOLIC PANEL
ALT: 13 U/L (ref 0–35)
AST: 11 U/L (ref 0–37)
Albumin: 2.7 g/dL — ABNORMAL LOW (ref 3.5–5.2)
Calcium: 8.8 mg/dL (ref 8.4–10.5)
GFR calc Af Amer: 24 mL/min — ABNORMAL LOW (ref >90)
Potassium: 4.8 mEq/L (ref 3.5–5.1)
Sodium: 138 mEq/L (ref 135–145)
Total Protein: 6.4 g/dL (ref 6.0–8.3)

## 2012-08-29 LAB — CBC WITH DIFFERENTIAL/PLATELET
Basophils Relative: 0 % (ref 0–1)
Eosinophils Relative: 2 % (ref 0–5)
HCT: 26.9 % — ABNORMAL LOW (ref 36.0–46.0)
Hemoglobin: 8.3 g/dL — ABNORMAL LOW (ref 12.0–15.0)
MCH: 25.6 pg — ABNORMAL LOW (ref 26.0–34.0)
MCHC: 30.9 g/dL (ref 30.0–36.0)
MCV: 83 fL (ref 78.0–100.0)
Monocytes Absolute: 0.4 10*3/uL (ref 0.1–1.0)
Monocytes Relative: 6 % (ref 3–12)
Neutro Abs: 5.9 10*3/uL (ref 1.7–7.7)

## 2012-08-29 LAB — GLUCOSE, CAPILLARY: Glucose-Capillary: 135 mg/dL — ABNORMAL HIGH (ref 70–99)

## 2012-08-29 MED ORDER — OXYCODONE-ACETAMINOPHEN 5-325 MG PO TABS: 1.0000 | ORAL_TABLET | Freq: Three times a day (TID) | ORAL | Status: DC | PRN

## 2012-08-29 MED ORDER — FERROUS SULFATE 325 (65 FE) MG PO TABS: 325.0000 mg | ORAL_TABLET | Freq: Three times a day (TID) | ORAL | Status: DC

## 2012-08-29 MED ORDER — WARFARIN SODIUM 5 MG PO TABS
5.0000 mg | ORAL_TABLET | Freq: Once | ORAL | Status: DC
  Filled 2012-08-29: qty 1

## 2012-08-29 MED ORDER — FUROSEMIDE 40 MG PO TABS: 40.0000 mg | ORAL_TABLET | Freq: Two times a day (BID) | ORAL | Status: DC

## 2012-08-29 MED ORDER — PANTOPRAZOLE SODIUM 40 MG PO TBEC: 40.0000 mg | DELAYED_RELEASE_TABLET | Freq: Every day | ORAL | Status: DC

## 2012-08-29 MED ORDER — INSULIN GLARGINE 100 UNIT/ML ~~LOC~~ SOLN: 22.0000 [IU] | Freq: Two times a day (BID) | SUBCUTANEOUS | Status: DC

## 2012-08-29 MED ORDER — POLYETHYLENE GLYCOL 3350 17 G PO PACK: 17.0000 g | PACK | Freq: Two times a day (BID) | ORAL | Status: DC

## 2012-08-29 NOTE — Progress Notes (Signed)
RT checked with patient about wearing CPAP. She states that she is not ready now but is aware to call for assistance at any time that she needs.

## 2012-08-29 NOTE — Progress Notes (Signed)
Cm spoke with the pt concerning dc planning. Pt states previously active with [facility] prior to hospital admittance. Per pt choice [facility] to continue [facility] services upon dc with HHPT. No other HH services or DME request stated. Rn notified of need for resumption of care order.    Leonie Green [phone]

## 2012-08-29 NOTE — Progress Notes (Signed)
ANTICOAGULATION CONSULT NOTE - Follow up  Pharmacy Consult for warfarin Indication: atrial fibrillation  Allergies  Allergen Reactions  . Daptomycin Other (See Comments)    Elevated CPK  . Morphine And Related Nausea And Vomiting  . Cephalexin Rash    unknown  . Celecoxib     unknown  . Codeine     unknown  . Fluoxetine Hcl     unknown  . Latex     REACTION: undefined  . Ofloxacin     unknown  . Penicillins     unknown  . Rofecoxib     unknown  . Sulfonamide Derivatives     unknown   Patient Measurements: Height: 5\' 3"  (160 cm) Weight: 249 lb 4.8 oz (113.082 kg) IBW/kg (Calculated) : 52.4   Vital Signs: Temp: 98.3 F (36.8 C) (09/21 0533) Temp src: Oral (09/21 0533) BP: 155/34 mmHg (09/21 0533) Pulse Rate: 64  (09/21 0533)  Labs:  [name] 08/29/12 8295 08/28/12 0420 08/27/12 0457  HGB 8.3* 8.4* --  HCT 26.9* 28.8* 29.1*  PLT 195 202 212  APTT -- -- --  LABPROT 27.3* 23.9* 20.3*  INR 2.69* 2.25* 1.81*  HEPARINUNFRC -- -- --  CREATININE 2.39* 2.29* 2.48*  CKTOTAL -- -- --  CKMB -- -- --  TROPONINI -- -- --   Estimated Creatinine Clearance: 29.6 ml/min (by C-G formula based on Cr of 2.39).  Assessment:  61 yo female admitted with wheezing. Pt on warfarin PTA for hx afib, pharmacy managing dosing inpatient. Warfarin dose PTA 6mg  daily  INR continues to trend up, somewhat quickly now. INR is therapeutic  H/H low but stable.  Darbopoetin given 9/12. No bleeding reported.  Potential DDI with Levaquin, amiodarone, and digoxin noted.  Goal of Therapy:  INR 2-3   Plan:   Coumadin 5mg  today  Daily PT/INR.  Gwen Her PharmD  [phone] 08/29/2012 10:57 AM

## 2012-08-29 NOTE — Progress Notes (Signed)
Patient's diastolic B/P was 26. Dr. [name] came to the unit to see the patient. The MD was notified of this diastolic B/P. The MD wanted RN to re-check B/P. B/P was re-checked, B/P was 183/38. Patient was treated for the elevated systolic B/P. MD was notified of the follow-up B/P.

## 2012-09-02 NOTE — Discharge Summary (Signed)
Physician Discharge Summary  Patient ID: [name] MRN: [id number] DOB/AGE: [birth year] 61 y.o.  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 Diabetes mellitus exacerbated by steroids. Obesity hypoventilation syndrome Hypertension. Prerenal azotemia,, symptomatic. Resolved History of atrial fibrillation on Coumadin therapy. Morbid obesity. Chronic kidney disease stage III Anemia of chronic disease. Deconditioning, multifactorial. 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 Chronic venous insufficiency of lower extremities. Gout. Psoriasis.  Discharged Condition: improved  Operations/Procedues: none   Hospital Course:  See admission H&P for details.this patient with multiple comorbidities presented to the emergency room with severe respiratory distress. Her blood pressure time of admission was 206/86 with extreme work of breathing. Patient was admitted to telemetry. She was started on IV Lasix initially at 60 mg q.6 hours. Because of ongoing bronchospasm she was placed on IV Solu-Medrol as well. Patient was maintained on her cardiac medications of amiodarone, digoxin. She required IV hydralazine for further control of her blood pressure. Patient was placed on a sliding scale insulin regimen as well. Patient required nebulizer therapy ongoing bronchospasms. Over the ensuing days the patient made slow but steady improvement. She was maintained on IV Levaquin for previously documented strep B. Pneumonia infection in her leg.  With intensive therapy patient came to make slow but steady improvement. Her blood pressures today gradually normalize. She developed transient bradycardia which required discontinuation of her digoxin but maintenance of her  amiodarone medication. After several days of aggressive IV Lasix patient developed prerenal azotemia. This was associated with her becoming uremic with blurred vision and tremulous. Her IV Lasix was stopped completely. Her renal functions gradually improved with her becoming less symptomatic as well. As patient improved efforts were made for ambulation. Due to her multiple comorbidities this was extremely slow process. She was evaluated and treated by physical therapy. Questions of short-term stay at a skilled nursing facility was addressed again which patient declined.  During treatment of asthma with IV steroids, patient's blood sugars did exacerbate considerably. Her insulin regimen was adjusted with eventual control. Her medications was again adjusted downward as her steroids were weaned off. During hospital stay patient was also encouraged to use a CPAP. She had not been using this consistently at home as well. Patient education was given. By 08/29/2012, patient was stable enough to go home with outpatient physical therapy. She will continue to require cardiac followup as well.     Disposition: 01-Home or Self Care  Discharge Orders    Future Appointments: Provider: Department: Dept Phone: Center:   09/03/2012 3:15 PM Randall Hiss, MD [facility] [phone] [facility]     Future Orders Please Complete By Expires   Diet - low sodium heart healthy      Increase activity slowly      No wound care      Call MD for:  severe uncontrolled pain      Discharge instructions      Comments:   Follow up with primary care physician in one week.       Medication List     As of 09/02/2012 11:18 PM    TAKE these medications         albuterol 108 (90 BASE) MCG/ACT inhaler   Commonly known as: PROVENTIL HFA;VENTOLIN HFA   Inhale 2 puffs into the lungs every 6 (six)  hours as needed. For shortness of breath      allopurinol 100 MG tablet   Commonly known as: ZYLOPRIM   Take 1 tablet by  mouth daily.      amiodarone 200 MG tablet   Commonly known as: PACERONE   Take 200 mg by mouth daily.      aspirin 81 MG tablet   Take 81 mg by mouth daily.      atorvastatin 20 MG tablet   Commonly known as: LIPITOR   Take 20 mg by mouth at bedtime.      clobetasol ointment 0.05 %   Commonly known as: TEMOVATE   Apply 1 application topically daily. Use as directed on legs      digoxin 0.125 MG tablet   Commonly known as: LANOXIN   Take 1 tablet by mouth daily.      ferrous sulfate 325 (65 FE) MG tablet   Take 1 tablet (325 mg total) by mouth 3 (three) times daily with meals.      furosemide 40 MG tablet   Commonly known as: LASIX   Take 1 tablet (40 mg total) by mouth 2 (two) times daily.      insulin aspart 100 UNIT/ML injection   Commonly known as: novoLOG   Inject 0-20 Units into the skin 3 (three) times daily before meals.      insulin glargine 100 UNIT/ML injection   Commonly known as: LANTUS   Inject 22 Units into the skin 2 (two) times daily.      insulin glargine 100 UNIT/ML injection   Commonly known as: LANTUS   Inject 10 Units into the skin at bedtime.      olmesartan 20 MG tablet   Commonly known as: BENICAR   Take 20 mg by mouth daily.      oxyCODONE-acetaminophen 5-325 MG per tablet   Commonly known as: PERCOCET/ROXICET   Take 1 tablet by mouth every 8 (eight) hours as needed (Pt states she is on this med at home for chronic Knee pain).      pantoprazole 40 MG tablet   Commonly known as: PROTONIX   Take 40 mg by mouth daily.      pantoprazole 40 MG tablet   Commonly known as: PROTONIX   Take 1 tablet (40 mg total) by mouth daily.      polyethylene glycol packet   Commonly known as: MIRALAX / GLYCOLAX   Take 17 g by mouth 2 (two) times daily.      spironolactone 25 MG tablet   Commonly known as: ALDACTONE   Take 25 mg by mouth daily.      warfarin 6 MG tablet   Commonly known as: COUMADIN   Take 6 mg by mouth daily.      zolpidem 5 MG  tablet   Commonly known as: AMBIEN   Take 5 mg by mouth at bedtime as needed. For sleep         Signed: [name], Ramla Hase 09/02/2012, 11:18 PM  Time required for discharge management greater than 30 minutes.

## 2012-09-03 ENCOUNTER — Ambulatory Visit (INDEPENDENT_AMBULATORY_CARE_PROVIDER_SITE_OTHER): Payer: Medicare HMO | Admitting: Infectious Disease

## 2012-09-03 ENCOUNTER — Encounter: Payer: Self-pay | Admitting: Infectious Disease

## 2012-09-03 VITALS — BP 170/64 | HR 65 | Temp 97.6°F | Wt 261.0 lb

## 2012-09-03 DIAGNOSIS — I5041 Acute combined systolic (congestive) and diastolic (congestive) heart failure: Secondary | ICD-10-CM

## 2012-09-03 DIAGNOSIS — T8450XA Infection and inflammatory reaction due to unspecified internal joint prosthesis, initial encounter: Secondary | ICD-10-CM

## 2012-09-03 LAB — CBC WITH DIFFERENTIAL/PLATELET
Basophils Absolute: 0 10*3/uL (ref 0.0–0.1)
Basophils Relative: 0 % (ref 0–1)
Eosinophils Absolute: 0.2 10*3/uL (ref 0.0–0.7)
Eosinophils Relative: 2 % (ref 0–5)
HCT: 25.6 % — ABNORMAL LOW (ref 36.0–46.0)
MCHC: 30.9 g/dL (ref 30.0–36.0)
MCV: 76.9 fL — ABNORMAL LOW (ref 78.0–100.0)
Monocytes Absolute: 0.5 10*3/uL (ref 0.1–1.0)
Neutro Abs: 5.6 10*3/uL (ref 1.7–7.7)
RDW: 19.5 % — ABNORMAL HIGH (ref 11.5–15.5)

## 2012-09-03 MED ORDER — LEVOFLOXACIN 250 MG PO TABS: 250.0000 mg | ORAL_TABLET | Freq: Every day | ORAL | Status: DC

## 2012-09-03 NOTE — Progress Notes (Signed)
Subjective:    Patient ID: [name], female    DOB: [birth year], 61 y.o.   MRN: [id number]  HPI  61 year old African American femaleWITH HISTORY OF PROSTHETIC KNEE INFECTION IN 2001, [place] TEQUIN AND RIFAMPIN WITH TWO STAGED JOINT REPLACEMENT, AND HISTORY OF MRSA FROM KNEE WOUND IN 2007, group B streptococcal infection in June 2012. R to clindamycin. (she also did have gout crystals in the joint. She was on chronic levofloxacin for suppressive therapy. According to a phone note by my partner Dr. Ilsa Iha the patient had been apparently changed from levofloxacin to doxycycline in August with apparent flareup of her swelling in her knee. That I was not aware of this problem until after the patient left the clinic. When I saw her she said that she was taking her levofloxacin religiously and that she had she been exercising more than in the past and had been able to walk about morning gauge a more physical activity. She did endorse however still having troubles with pain with greater exertion and also the fluid buildup in her left knee. She's been without fevers nausea chills or other systemic symptoms at present.  Review of Systems  Constitutional: Negative for fever, chills, diaphoresis, activity change, appetite change, fatigue and unexpected weight change.  HENT: Negative for congestion, sore throat, rhinorrhea, sneezing, trouble swallowing and sinus pressure.   Eyes: Negative for photophobia and visual disturbance.  Respiratory: Negative for cough, chest tightness, shortness of breath, wheezing and stridor.   Cardiovascular: Negative for chest pain, palpitations and leg swelling.  Gastrointestinal: Negative for nausea, vomiting, abdominal pain, diarrhea, constipation, blood in stool, abdominal distention and anal bleeding.  Genitourinary: Negative for dysuria, hematuria, flank pain and difficulty urinating.  Musculoskeletal: Positive for joint swelling and arthralgias. Negative for myalgias, back  pain and gait problem.  Skin: Negative for color change, pallor, rash and wound.  Neurological: Negative for dizziness, tremors, weakness and light-headedness.  Hematological: Negative for adenopathy. Does not bruise/bleed easily.  Psychiatric/Behavioral: Negative for behavioral problems, confusion, disturbed wake/sleep cycle, dysphoric mood, decreased concentration and agitation.       Objective:   Physical Exam  Constitutional: She is oriented to person, place, and time. She appears well-developed and well-nourished. No distress.  HENT:  Head: Normocephalic and atraumatic.  Mouth/Throat: No oropharyngeal exudate.  Eyes: EOM are normal.  Neck: Normal range of motion.  Cardiovascular: Normal rate, regular rhythm and normal heart sounds.   Pulmonary/Chest: Effort normal and breath sounds normal. No respiratory distress.  Abdominal: She exhibits no distension.  Musculoskeletal: She exhibits edema. She exhibits no tenderness.       Legs: Neurological: She is alert and oriented to person, place, and time. Coordination normal.  Skin: Skin is warm and dry. She is not diaphoretic. There is erythema. No pallor.  Psychiatric: She has a normal mood and affect. Her behavior is normal. Judgment and thought content normal.          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 Certainly when she was changed to doxycycline this could have caused the flare of the doxycycline not be reliably active agent against group B strep. We'll check a sedimentation rate and C-reactive protein today as well. Note the only surgical options entertained to try to cure this infection have recently been once involving amputation which the patient is here to avoid at all costs. Therefore continue on chronic suppressive therapy.  Acute combined systolic and diastolic heart failure She was recently admitted for  heart failure exacerbation earlier this month. She is  now doing better.

## 2012-09-03 NOTE — Assessment & Plan Note (Signed)
Continue chronic levofloxacin suppressive therapy. Certainly when she was changed to doxycycline this could have caused the flare of the doxycycline not be reliably active agent against group B strep. We'll check a sedimentation rate and C-reactive protein today as well. Note the only surgical options entertained to try to cure this infection have recently been once involving amputation which the patient is here to avoid at all costs. Therefore continue on chronic suppressive therapy.

## 2012-09-03 NOTE — Assessment & Plan Note (Signed)
She was recently admitted for heart failure exacerbation earlier this month. She is now doing better.

## 2012-09-04 ENCOUNTER — Telehealth: Payer: Self-pay | Admitting: Infectious Disease

## 2012-09-04 LAB — COMPLETE METABOLIC PANEL WITH GFR
Albumin: 3.3 g/dL — ABNORMAL LOW (ref 3.5–5.2)
Alkaline Phosphatase: 84 U/L (ref 39–117)
CO2: 23 mEq/L (ref 19–32)
GFR, Est Non African American: 19 mL/min — ABNORMAL LOW
Glucose, Bld: 124 mg/dL — ABNORMAL HIGH (ref 70–99)
Potassium: 5.6 mEq/L — ABNORMAL HIGH (ref 3.5–5.3)
Sodium: 138 mEq/L (ref 135–145)
Total Protein: 6.1 g/dL (ref 6.0–8.3)

## 2012-09-04 NOTE — Telephone Encounter (Signed)
Pts K is 5.5 I thinks she should hold her benicar, aldactone and have her labs checked with her PCP on Monday. I left message of her to that effect

## 2012-09-06 NOTE — Progress Notes (Signed)
INFECTIOUS DISEASES CLINIC  RFV: worsening swelling of her prosthetic knee joint Subjective:    Patient ID: [name], female    DOB: [birth year], 61 y.o.   MRN: [id number]  HPI [name] is in for her hospital followup visit. She was hospitalized last month after she developed a hypersensitivity dermatitis that was felt to be due to the interaction of methotrexate and her levofloxacin. The methotrexate was held and she continued on levofloxacin with improvement. When my partner, Dr. 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 She was switched temporarily to doxycycline but now resumed back to levofloxacin without any difficulty. She now presents with a 1 week history of f constant, moderate left knee joint pain and swelling with an onset a week ago. She is concerned that she is having recurrent infection in her knee.  No fever, chills, nightsweats, although pain, swelling and warmth to her left knee  Current Outpatient Prescriptions on File Prior to Visit  Medication Sig Dispense Refill  . allopurinol (ZYLOPRIM) 100 MG tablet Take 1 tablet by mouth daily.      [name] amiodarone (PACERONE) 200 MG tablet Take 200 mg by mouth daily.      [name] aspirin 81 MG tablet Take 81 mg by mouth daily.       [name] atorvastatin (LIPITOR) 20 MG tablet Take 20 mg by mouth at bedtime.      . clobetasol (TEMOVATE) 0.05 % ointment Apply 1 application topically daily. Use as directed on legs      . digoxin (LANOXIN) 0.125 MG tablet Take 1 tablet by mouth daily.      . insulin aspart (NOVOLOG) 100 UNIT/ML injection Inject 0-20 Units into the skin 3 (three) times daily before meals.      . pantoprazole (PROTONIX) 40 MG tablet Take 40 mg by mouth daily.      [name] spironolactone (ALDACTONE) 25 MG tablet Take 25 mg by mouth daily.        [name] warfarin (COUMADIN) 6 MG tablet Take 6 mg by mouth daily.        [name] zolpidem (AMBIEN) 5 MG tablet Take 5 mg by mouth at bedtime as needed. For sleep      . ferrous sulfate 325 (65 FE) MG tablet Take 1 tablet (325 mg total) by mouth 3 (three) times daily with meals.  90 tablet  2  . olmesartan (BENICAR) 20 MG tablet Take 20 mg by mouth daily.       Active Ambulatory Problems    Diagnosis Date Noted  . HYPERLIPIDEMIA 03/18/2010  . OBSTRUCTIVE SLEEP APNEA 03/15/2010  . HYPERTENSION 03/18/2010  . ATRIAL FIBRILLATION WITH RAPID VENTRICULAR RESPONSE 10/17/2010  . Acute combined systolic and diastolic heart failure 10/17/2010  . C V A / STROKE 03/18/2010  . BRONCHITIS, RECURRENT 03/15/2010  . ASTHMA 03/18/2010  . Obesity hypoventilation syndrome 04/20/2010  . Prosthetic joint infection 07/24/2011  . Group B streptococcal infection 07/24/2011  . Gout 07/24/2011  . Yeast infection involving the vagina and surrounding area 09/25/2011  . SBO (small bowel obstruction) 11/23/2011  . DM (diabetes mellitus) 11/23/2011  . Chronic anticoagulation 11/24/2011  . Oral candidiasis 11/25/2011  . Laceration of leg 03/02/2012  . Chronic venous insufficiency 03/09/2012  . Acute respiratory failure 08/15/2012  . Acute diastolic CHF (congestive heart failure) 08/15/2012  . Acute asthma exacerbation 08/15/2012  . Hypertensive urgency 08/15/2012  .  CKD (chronic kidney disease), stage III 08/15/2012  . Anemia of chronic disease 08/15/2012   Resolved Ambulatory Problems    Diagnosis Date Noted  . No Resolved Ambulatory Problems   Past Medical History  Diagnosis Date  . Asthma   . Bronchitis   . Allergic rhinitis   . Diabetes mellitus   . Stroke   . HTN (hypertension)   . OSA (obstructive sleep apnea)   . DJD (degenerative joint disease)   . Kidney stones   . Depression   . Hyperlipidemia   . Complication of anesthesia   . CHF (congestive heart failure)      Review of Systems  Constitutional: Negative for fever, chills, diaphoresis, activity change,  appetite change, fatigue and unexpected weight change.  HENT: Negative for congestion, sore throat, rhinorrhea, sneezing, trouble swallowing and sinus pressure.  Eyes: Negative for photophobia and visual disturbance.  Respiratory: Negative for cough, chest tightness, shortness of breath, wheezing and stridor.  Cardiovascular: Negative for chest pain, palpitations and leg swelling.  Gastrointestinal: Negative for nausea, vomiting, abdominal pain, diarrhea, constipation, blood in stool, abdominal distention and anal bleeding.  Genitourinary: Negative for dysuria, hematuria, flank pain and difficulty urinating.  Musculoskeletal: per hpi Skin: Negative for color change, pallor, rash and wound.  Neurological: Negative for dizziness, tremors, weakness and light-headedness.  Hematological: Negative for adenopathy. Does not bruise/bleed easily.  Psychiatric/Behavioral: Negative for behavioral problems, confusion, sleep disturbance, dysphoric mood, decreased concentration and agitation.      Objective:   Physical Exam  BP 167/75  Pulse 73  Temp 98.4 F (36.9 C) (Oral) Physical Exam  Constitutional: oriented to person, place, and time.  appears well-developed and well-nourished. No distress.  HENT:  Mouth/Throat: Oropharynx is clear and moist. No oropharyngeal exudate.   Pulmonary/Chest: Effort normal and breath sounds normal. No respiratory distress.  no wheezes.  Abdominal: Soft. Bowel sounds are normal. He exhibits no distension. There is no tenderness.  Lymphadenopathy:  no cervical adenopathy.  Ext = left knee is warm to touch, + effusion. Skin: Skin is warm and dry. No rash noted. No erythema.  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 Recurrent infection. Have arranged for patient to go to ED for arthrocentesis since  unable to get to orthopedics office.

## 2012-11-04 ENCOUNTER — Telehealth: Payer: Self-pay | Admitting: *Deleted

## 2012-11-04 NOTE — Telephone Encounter (Signed)
"  Itching, sores legs which I scratched and it bled."  Pt reported that her legs are swollen and have blisters on them.  RN asked the pt whether she had called her PCP, Dr. Bary Richard about this problem.  She stated that she had called that office but wanted to see whether Dr. Daiva Eves would see her for this.  Dr. Daiva Eves is following the pt for an infection in her knee.  Pt has f/u OV w/ Dr. Daiva Eves scheduled for Jan. 2014.  RN advised the pt to contact Dr. Diamantina [facility] office and describe the condition of her legs and request an appointment.  Pt verbalized understanding.

## 2013-01-04 ENCOUNTER — Ambulatory Visit (INDEPENDENT_AMBULATORY_CARE_PROVIDER_SITE_OTHER): Payer: Medicare HMO | Admitting: Infectious Disease

## 2013-01-04 ENCOUNTER — Encounter: Payer: Self-pay | Admitting: Infectious Disease

## 2013-01-04 VITALS — BP 158/72 | HR 69 | Temp 98.1°F | Wt 270.0 lb

## 2013-01-04 DIAGNOSIS — L409 Psoriasis, unspecified: Secondary | ICD-10-CM

## 2013-01-04 DIAGNOSIS — B951 Streptococcus, group B, as the cause of diseases classified elsewhere: Secondary | ICD-10-CM

## 2013-01-04 DIAGNOSIS — T8450XA Infection and inflammatory reaction due to unspecified internal joint prosthesis, initial encounter: Secondary | ICD-10-CM

## 2013-01-04 DIAGNOSIS — M109 Gout, unspecified: Secondary | ICD-10-CM

## 2013-01-04 DIAGNOSIS — L408 Other psoriasis: Secondary | ICD-10-CM

## 2013-01-04 DIAGNOSIS — I878 Other specified disorders of veins: Secondary | ICD-10-CM

## 2013-01-04 DIAGNOSIS — I872 Venous insufficiency (chronic) (peripheral): Secondary | ICD-10-CM

## 2013-01-04 DIAGNOSIS — A491 Streptococcal infection, unspecified site: Secondary | ICD-10-CM

## 2013-01-04 NOTE — Progress Notes (Signed)
Subjective:    Patient ID: [name], female    DOB: 1951/06/03, 62 y.o.   MRN: [id number]  HPI  62 year old African American femaleWITH HISTORY OF PROSTHETIC KNEE INFECTION IN 2001, [place] TEQUIN AND RIFAMPIN WITH TWO STAGED JOINT REPLACEMENT, AND HISTORY OF MRSA FROM KNEE WOUND IN 2007, group B streptococcal infection in June 2012. R to clindamycin. (she also did have gout crystals in the joint. She was on chronic levofloxacin for suppressive therapy. According to a phone note by my partner Dr. Drue Second the patient had been apparently changed from levofloxacin to doxycycline in August 2013 with apparent flareup of her swelling in her knee. Since then she has gone back onto levaquin with improvement in her knee pain. She still does have pain with weight bearing but not at rest. She had gout flare one month ago in her left big toe. She has had problems with psoriasis but not been dx with psoriatic arthritis to her knowledge. She also appears to be suffering from venous stasis in her left leg > right. We discussed various strategies for her knee including pursuit of cure with removal of hardware and 6 weeks of IV antiobiotics. I doubt surgeon would hazard anoterh new TKA though. She would likely live with abx spacer. She had been followed by Dr Renae Fickle and Dr Luiz Blare but apparently has large bill with their practice and claims this as reason she no longer follows there. She has been seen at [facility] whre she was told her only option was [facility] and she is not seen there. We discussed her having a possible 3rd opinion re surgical management and I can refer her to a different surgeon if she would like and that is her desire. She does not seem eager for surgery though. For now I will continue attempts to suppress her infection with levaquin. I spent greater than 45 minutes with the patient including greater than 50% of time in face to face counsel of the patient and in coordination of their care.    Review of  Systems  Constitutional: Negative for fever, chills, diaphoresis, activity change, appetite change, fatigue and unexpected weight change.  HENT: Negative for congestion, sore throat, rhinorrhea, sneezing, trouble swallowing and sinus pressure.   Eyes: Negative for photophobia and visual disturbance.  Respiratory: Negative for cough, chest tightness, shortness of breath, wheezing and stridor.   Cardiovascular: Negative for chest pain, palpitations and leg swelling.  Gastrointestinal: Negative for nausea, vomiting, abdominal pain, diarrhea, constipation, blood in stool, abdominal distention and anal bleeding.  Genitourinary: Negative for dysuria, hematuria, flank pain and difficulty urinating.  Musculoskeletal: Positive for arthralgias and gait problem. Negative for myalgias, back pain and joint swelling.  Skin: Negative for color change, pallor, rash and wound.  Neurological: Negative for dizziness, tremors, weakness and light-headedness.  Hematological: Negative for adenopathy. Does not bruise/bleed easily.  Psychiatric/Behavioral: Negative for behavioral problems, confusion, sleep disturbance, dysphoric mood, decreased concentration and agitation.       Objective:   Physical Exam  Constitutional: She is oriented to person, place, and time. She appears well-developed and well-nourished. No distress.  HENT:  Head: Normocephalic and atraumatic.  Mouth/Throat: Oropharynx is clear and moist. No oropharyngeal exudate.  Eyes: Conjunctivae normal and EOM are normal. Pupils are equal, round, and reactive to light. No scleral icterus.  Neck: Normal range of motion. Neck supple. No JVD present.  Cardiovascular: Normal rate, regular rhythm and normal heart sounds.  Exam reveals no gallop and no friction rub.  No murmur heard. Pulmonary/Chest: Effort normal and breath sounds normal. No respiratory distress. She has no wheezes. She has no rales. She exhibits no tenderness.  Abdominal: She exhibits no  distension and no mass. There is no tenderness. There is no rebound and no guarding.  Musculoskeletal: She exhibits no edema and no tenderness.       Legs: Lymphadenopathy:    She has no cervical adenopathy.  Neurological: She is alert and oriented to person, place, and time. Coordination normal.  Skin: Skin is warm and dry. She is not diaphoretic. No erythema. No pallor.  Psychiatric: She has a normal mood and affect. Her behavior is normal. Judgment and thought content normal.          Assessment & Plan:  Prosthetic joint infection with GBS: continue suppressive levaquin for now. Recheck ESR (was in 130s at last check) keeping in mind that gout or if she has psoriatic arthritis (not clearly a dx that fits here) they could cause elevation of markers as well. She wishes for 3rd opinion and will make referral to different orthopedic group in [place] or if nto here then could have her seen at [facility] or [facility] (already seen at [facility])

## 2013-01-27 ENCOUNTER — Encounter: Payer: Self-pay | Admitting: *Deleted

## 2013-02-05 ENCOUNTER — Encounter: Payer: Self-pay | Admitting: Hematology

## 2013-02-11 ENCOUNTER — Inpatient Hospital Stay (HOSPITAL_COMMUNITY)
Admission: EM | Admit: 2013-02-11 | Discharge: 2013-02-18 | DRG: 291 | Disposition: A | Discharge status: Home / Self Care | Payer: Medicare HMO | Attending: Internal Medicine | Admitting: Internal Medicine

## 2013-02-11 ENCOUNTER — Emergency Department (HOSPITAL_COMMUNITY): Payer: Medicare HMO

## 2013-02-11 ENCOUNTER — Encounter (HOSPITAL_COMMUNITY): Payer: Self-pay

## 2013-02-11 DIAGNOSIS — G4733 Obstructive sleep apnea (adult) (pediatric): Secondary | ICD-10-CM | POA: Diagnosis present

## 2013-02-11 DIAGNOSIS — E662 Morbid (severe) obesity with alveolar hypoventilation: Secondary | ICD-10-CM | POA: Diagnosis present

## 2013-02-11 DIAGNOSIS — J45901 Unspecified asthma with (acute) exacerbation: Secondary | ICD-10-CM

## 2013-02-11 DIAGNOSIS — I129 Hypertensive chronic kidney disease with stage 1 through stage 4 chronic kidney disease, or unspecified chronic kidney disease: Secondary | ICD-10-CM | POA: Diagnosis present

## 2013-02-11 DIAGNOSIS — Z8673 Personal history of transient ischemic attack (TIA), and cerebral infarction without residual deficits: Secondary | ICD-10-CM

## 2013-02-11 DIAGNOSIS — Z6841 Body Mass Index (BMI) 40.0 and over, adult: Secondary | ICD-10-CM

## 2013-02-11 DIAGNOSIS — E1149 Type 2 diabetes mellitus with other diabetic neurological complication: Secondary | ICD-10-CM | POA: Diagnosis present

## 2013-02-11 DIAGNOSIS — E119 Type 2 diabetes mellitus without complications: Secondary | ICD-10-CM

## 2013-02-11 DIAGNOSIS — Y831 Surgical operation with implant of artificial internal device as the cause of abnormal reaction of the patient, or of later complication, without mention of misadventure at the time of the procedure: Secondary | ICD-10-CM | POA: Diagnosis present

## 2013-02-11 DIAGNOSIS — I509 Heart failure, unspecified: Secondary | ICD-10-CM

## 2013-02-11 DIAGNOSIS — I10 Essential (primary) hypertension: Secondary | ICD-10-CM | POA: Diagnosis present

## 2013-02-11 DIAGNOSIS — Z96659 Presence of unspecified artificial knee joint: Secondary | ICD-10-CM

## 2013-02-11 DIAGNOSIS — J9600 Acute respiratory failure, unspecified whether with hypoxia or hypercapnia: Secondary | ICD-10-CM

## 2013-02-11 DIAGNOSIS — I359 Nonrheumatic aortic valve disorder, unspecified: Secondary | ICD-10-CM

## 2013-02-11 DIAGNOSIS — D649 Anemia, unspecified: Secondary | ICD-10-CM

## 2013-02-11 DIAGNOSIS — I5031 Acute diastolic (congestive) heart failure: Secondary | ICD-10-CM

## 2013-02-11 DIAGNOSIS — R0902 Hypoxemia: Secondary | ICD-10-CM | POA: Diagnosis present

## 2013-02-11 DIAGNOSIS — T8450XA Infection and inflammatory reaction due to unspecified internal joint prosthesis, initial encounter: Secondary | ICD-10-CM

## 2013-02-11 DIAGNOSIS — F329 Major depressive disorder, single episode, unspecified: Secondary | ICD-10-CM | POA: Diagnosis present

## 2013-02-11 DIAGNOSIS — E1142 Type 2 diabetes mellitus with diabetic polyneuropathy: Secondary | ICD-10-CM | POA: Diagnosis present

## 2013-02-11 DIAGNOSIS — Z7901 Long term (current) use of anticoagulants: Secondary | ICD-10-CM

## 2013-02-11 DIAGNOSIS — N289 Disorder of kidney and ureter, unspecified: Secondary | ICD-10-CM

## 2013-02-11 DIAGNOSIS — E669 Obesity, unspecified: Secondary | ICD-10-CM | POA: Diagnosis present

## 2013-02-11 DIAGNOSIS — E1129 Type 2 diabetes mellitus with other diabetic kidney complication: Secondary | ICD-10-CM | POA: Diagnosis present

## 2013-02-11 DIAGNOSIS — I4891 Unspecified atrial fibrillation: Secondary | ICD-10-CM | POA: Diagnosis present

## 2013-02-11 DIAGNOSIS — J45909 Unspecified asthma, uncomplicated: Secondary | ICD-10-CM | POA: Diagnosis present

## 2013-02-11 DIAGNOSIS — Z791 Long term (current) use of non-steroidal anti-inflammatories (NSAID): Secondary | ICD-10-CM

## 2013-02-11 DIAGNOSIS — Z79899 Other long term (current) drug therapy: Secondary | ICD-10-CM

## 2013-02-11 DIAGNOSIS — N179 Acute kidney failure, unspecified: Secondary | ICD-10-CM | POA: Diagnosis present

## 2013-02-11 DIAGNOSIS — I5041 Acute combined systolic (congestive) and diastolic (congestive) heart failure: Principal | ICD-10-CM | POA: Diagnosis present

## 2013-02-11 DIAGNOSIS — F3289 Other specified depressive episodes: Secondary | ICD-10-CM | POA: Diagnosis present

## 2013-02-11 DIAGNOSIS — Z87891 Personal history of nicotine dependence: Secondary | ICD-10-CM

## 2013-02-11 DIAGNOSIS — N183 Chronic kidney disease, stage 3 unspecified: Secondary | ICD-10-CM

## 2013-02-11 DIAGNOSIS — D638 Anemia in other chronic diseases classified elsewhere: Secondary | ICD-10-CM | POA: Diagnosis present

## 2013-02-11 DIAGNOSIS — Z794 Long term (current) use of insulin: Secondary | ICD-10-CM

## 2013-02-11 LAB — BASIC METABOLIC PANEL
BUN: 30 mg/dL — ABNORMAL HIGH (ref 6–23)
Calcium: 8.6 mg/dL (ref 8.4–10.5)
Creatinine, Ser: 2.3 mg/dL — ABNORMAL HIGH (ref 0.50–1.10)
GFR calc Af Amer: 25 mL/min — ABNORMAL LOW (ref >90)

## 2013-02-11 LAB — TROPONIN I
Troponin I: <0.3 ng/mL (ref <0.30)
Troponin I: <0.3 ng/mL (ref <0.30)

## 2013-02-11 LAB — URINALYSIS, MICROSCOPIC ONLY
Bilirubin Urine: NEGATIVE
Glucose, UA: NEGATIVE mg/dL
Ketones, ur: NEGATIVE mg/dL
Leukocytes, UA: NEGATIVE
Specific Gravity, Urine: 1.017 (ref 1.005–1.030)
pH: 5 (ref 5.0–8.0)

## 2013-02-11 LAB — CBC WITH DIFFERENTIAL/PLATELET
Basophils Absolute: 0 10*3/uL (ref 0.0–0.1)
Basophils Relative: 0 % (ref 0–1)
Eosinophils Relative: 2 % (ref 0–5)
HCT: 25.2 % — ABNORMAL LOW (ref 36.0–46.0)
MCH: 22.5 pg — ABNORMAL LOW (ref 26.0–34.0)
MCHC: 29.8 g/dL — ABNORMAL LOW (ref 30.0–36.0)
MCV: 75.7 fL — ABNORMAL LOW (ref 78.0–100.0)
Monocytes Absolute: 0.5 10*3/uL (ref 0.1–1.0)
RDW: 20.2 % — ABNORMAL HIGH (ref 11.5–15.5)

## 2013-02-11 LAB — TSH: TSH: 3.867 u[IU]/mL (ref 0.350–4.500)

## 2013-02-11 LAB — MRSA PCR SCREENING: MRSA by PCR: NEGATIVE

## 2013-02-11 LAB — GLUCOSE, CAPILLARY: Glucose-Capillary: 100 mg/dL — ABNORMAL HIGH (ref 70–99)

## 2013-02-11 MED ORDER — SODIUM CHLORIDE 0.9 % IJ SOLN
3.0000 mL | Freq: Two times a day (BID) | INTRAMUSCULAR | Status: DC
  Administered 2013-02-12 – 2013-02-18 (×9): 3 mL via INTRAVENOUS

## 2013-02-11 MED ORDER — OXYCODONE-ACETAMINOPHEN 5-325 MG PO TABS
1.0000 | ORAL_TABLET | Freq: Three times a day (TID) | ORAL | Status: DC | PRN
  Administered 2013-02-11 – 2013-02-14 (×5): 1 via ORAL
  Filled 2013-02-11 (×5): qty 1

## 2013-02-11 MED ORDER — INSULIN ASPART 100 UNIT/ML ~~LOC~~ SOLN
0.0000 [IU] | Freq: Three times a day (TID) | SUBCUTANEOUS | Status: DC
  Administered 2013-02-12: 3 [IU] via SUBCUTANEOUS
  Administered 2013-02-12: 4 [IU] via SUBCUTANEOUS
  Administered 2013-02-14 – 2013-02-17 (×4): 3 [IU] via SUBCUTANEOUS

## 2013-02-11 MED ORDER — SODIUM CHLORIDE 0.9 % IJ SOLN: 3.0000 mL | INTRAMUSCULAR | Status: DC | PRN

## 2013-02-11 MED ORDER — ALBUTEROL SULFATE HFA 108 (90 BASE) MCG/ACT IN AERS: 2.0000 | INHALATION_SPRAY | Freq: Four times a day (QID) | RESPIRATORY_TRACT | Status: DC | PRN

## 2013-02-11 MED ORDER — ACETAMINOPHEN 325 MG PO TABS: 650.0000 mg | ORAL_TABLET | ORAL | Status: DC | PRN

## 2013-02-11 MED ORDER — SPIRONOLACTONE 25 MG PO TABS
25.0000 mg | ORAL_TABLET | Freq: Every day | ORAL | Status: DC
  Administered 2013-02-11 – 2013-02-18 (×8): 25 mg via ORAL
  Filled 2013-02-11 (×8): qty 1

## 2013-02-11 MED ORDER — ALBUTEROL SULFATE HFA 108 (90 BASE) MCG/ACT IN AERS
2.0000 | INHALATION_SPRAY | RESPIRATORY_TRACT | Status: DC | PRN
  Administered 2013-02-11: 2 via RESPIRATORY_TRACT
  Filled 2013-02-11: qty 6.7

## 2013-02-11 MED ORDER — IRBESARTAN 150 MG PO TABS
150.0000 mg | ORAL_TABLET | Freq: Every day | ORAL | Status: DC
  Administered 2013-02-11 – 2013-02-12 (×2): 150 mg via ORAL
  Filled 2013-02-11 (×2): qty 1

## 2013-02-11 MED ORDER — INSULIN GLARGINE 100 UNIT/ML ~~LOC~~ SOLN
22.0000 [IU] | Freq: Two times a day (BID) | SUBCUTANEOUS | Status: DC
  Administered 2013-02-11 – 2013-02-16 (×12): 22 [IU] via SUBCUTANEOUS
  Administered 2013-02-17: 11:00:00 via SUBCUTANEOUS
  Administered 2013-02-17 – 2013-02-18 (×2): 22 [IU] via SUBCUTANEOUS

## 2013-02-11 MED ORDER — PANTOPRAZOLE SODIUM 40 MG PO TBEC
40.0000 mg | DELAYED_RELEASE_TABLET | Freq: Every day | ORAL | Status: DC
  Administered 2013-02-11 – 2013-02-18 (×8): 40 mg via ORAL
  Filled 2013-02-11 (×8): qty 1

## 2013-02-11 MED ORDER — FERROUS SULFATE 325 (65 FE) MG PO TABS
325.0000 mg | ORAL_TABLET | Freq: Three times a day (TID) | ORAL | Status: DC
  Administered 2013-02-11 – 2013-02-18 (×23): 325 mg via ORAL
  Filled 2013-02-11 (×26): qty 1

## 2013-02-11 MED ORDER — WARFARIN - PHARMACIST DOSING INPATIENT
Freq: Every day | Status: DC
  Administered 2013-02-11: 17:00:00

## 2013-02-11 MED ORDER — AMIODARONE HCL 200 MG PO TABS
200.0000 mg | ORAL_TABLET | Freq: Every day | ORAL | Status: DC
  Administered 2013-02-11 – 2013-02-18 (×8): 200 mg via ORAL
  Filled 2013-02-11 (×8): qty 1

## 2013-02-11 MED ORDER — AMLODIPINE BESYLATE 5 MG PO TABS
5.0000 mg | ORAL_TABLET | Freq: Every day | ORAL | Status: DC
  Administered 2013-02-12 – 2013-02-17 (×6): 5 mg via ORAL
  Filled 2013-02-11 (×7): qty 1

## 2013-02-11 MED ORDER — ATORVASTATIN CALCIUM 20 MG PO TABS
20.0000 mg | ORAL_TABLET | Freq: Every day | ORAL | Status: DC
  Administered 2013-02-11 – 2013-02-17 (×7): 20 mg via ORAL
  Filled 2013-02-11 (×8): qty 1

## 2013-02-11 MED ORDER — ONDANSETRON HCL 4 MG/2ML IJ SOLN: 4.0000 mg | Freq: Four times a day (QID) | INTRAMUSCULAR | Status: DC | PRN

## 2013-02-11 MED ORDER — SODIUM CHLORIDE 0.9 % IV SOLN: 250.0000 mL | INTRAVENOUS | Status: DC | PRN

## 2013-02-11 MED ORDER — NITROGLYCERIN IN D5W 200-5 MCG/ML-% IV SOLN
2.0000 ug/min | INTRAVENOUS | Status: DC
  Administered 2013-02-11: 5 ug/min via INTRAVENOUS
  Administered 2013-02-12: 45 ug/min via INTRAVENOUS
  Filled 2013-02-11 (×2): qty 250

## 2013-02-11 MED ORDER — ZOLPIDEM TARTRATE 5 MG PO TABS: 5.0000 mg | ORAL_TABLET | Freq: Every evening | ORAL | Status: DC | PRN

## 2013-02-11 MED ORDER — FUROSEMIDE 10 MG/ML IJ SOLN
40.0000 mg | Freq: Once | INTRAMUSCULAR | Status: AC
  Administered 2013-02-11: 40 mg via INTRAVENOUS
  Filled 2013-02-11: qty 4

## 2013-02-11 MED ORDER — LEVOFLOXACIN 250 MG PO TABS
250.0000 mg | ORAL_TABLET | Freq: Every day | ORAL | Status: DC
  Administered 2013-02-11 – 2013-02-12 (×2): 250 mg via ORAL
  Filled 2013-02-11 (×2): qty 1

## 2013-02-11 MED ORDER — ALLOPURINOL 100 MG PO TABS
100.0000 mg | ORAL_TABLET | Freq: Every day | ORAL | Status: DC
  Administered 2013-02-11 – 2013-02-18 (×8): 100 mg via ORAL
  Filled 2013-02-11 (×8): qty 1

## 2013-02-11 MED ORDER — WARFARIN SODIUM 5 MG PO TABS
5.0000 mg | ORAL_TABLET | Freq: Once | ORAL | Status: AC
  Administered 2013-02-11: 5 mg via ORAL
  Filled 2013-02-11: qty 1

## 2013-02-11 MED ORDER — FUROSEMIDE 10 MG/ML IJ SOLN
60.0000 mg | Freq: Three times a day (TID) | INTRAMUSCULAR | Status: DC
  Administered 2013-02-11 – 2013-02-12 (×2): 60 mg via INTRAVENOUS
  Filled 2013-02-11 (×6): qty 6

## 2013-02-11 MED ORDER — WARFARIN SODIUM 7.5 MG PO TABS
7.5000 mg | ORAL_TABLET | Freq: Once | ORAL | Status: DC
  Filled 2013-02-11: qty 1

## 2013-02-11 NOTE — H&P (Signed)
[facility] History and Physical  [name] GNF:[id number] DOB: December 06, 1951 DOA: 02/11/2013  Referring physician: ed PCP: [name] [name], MD  Specialists: Daiva Eves ID, [place] [facility]  Chief Complaint: SOB  HPI: [name] is a 62 y.o. female who presents to the ED from home via EMS with SOB.  Nonproductive cough and SOB onset on Monday.  Used last dose of albuterol on Tuesday and felt somewhat better.  Not missing any medication other than albuterol at home.  No fever nor chills.  Significant orthopnea with the SOB, has a long history of CHF and hospitalizations for it in the past as well she states.  Took an extra dose of lasix at home without any improvement of symptoms.  Swelling in BLE is currently at baseline.  In the ED lab workup, clinical exam, and CXR demonstrated CHF, 0 sirs criteria were noted.  Hospitalist has been asked to admit for CHF exacerbation.   Review of Systems: 12 systems reviewed and otherwise negative.  Past Medical History  Diagnosis Date  . Asthma   . Bronchitis   . Allergic rhinitis   . Diabetes mellitus   . HTN (hypertension)   . DJD (degenerative joint disease)   . Hyperlipidemia   . Complication of anesthesia     trouble waking up  . Obesity   . OSA (obstructive sleep apnea)     poor compliance with cpap  . CHF (congestive heart failure)   . Biventricular failure     compensated  . Psoriasis   . Stasis edema     of lower extremities  . Depression   . Situational stress   . Stroke   . Kidney stones   . Kidney disease, chronic, stage III (GFR 30-59 ml/min)   . Anemia   . Gout   . Chronic anticoagulation   . Yeast infection involving the vagina and surrounding area   . Atrial fibrillation with RVR    Past Surgical History  Procedure Laterality Date  . Appendectomy    . Cholecystectomy    . Total knee arthroplasty    . Total abdominal hysterectomy    . Back surgery    . Cystectomy      left hand   Social History:  reports  that she has quit smoking. Her smoking use included Cigarettes. She smoked 0.00 packs per day. She has never used smokeless tobacco. She reports that she does not drink alcohol or use illicit drugs.   Allergies  Allergen Reactions  . Daptomycin Other (See Comments)    Elevated CPK  . Morphine And Related Nausea And Vomiting  . Cephalexin Rash    unknown  . Celecoxib     unknown  . Codeine     unknown  . Fluoxetine Hcl     unknown  . Latex     REACTION: undefined  . Ofloxacin     unknown  . Penicillins     unknown  . Rofecoxib     unknown  . Sulfonamide Derivatives     unknown    Family History  Problem Relation Age of Onset  . Heart attack Father   . Asthma Father   . Heart disease Paternal Uncle   . Rectal cancer Paternal Aunt   . Other Mother     mva    Prior to Admission medications   Medication Sig Start Date End Date Taking? Authorizing Provider  albuterol (PROVENTIL HFA;VENTOLIN HFA) 108 (90 BASE) MCG/ACT inhaler Inhale 2 puffs  into the lungs every 6 (six) hours as needed. For shortness of breath   Yes Historical Provider, MD  allopurinol (ZYLOPRIM) 100 MG tablet Take 1 tablet by mouth daily. 09/16/11  Yes Historical Provider, MD  amiodarone (PACERONE) 200 MG tablet Take 200 mg by mouth daily.   Yes Historical Provider, MD  amLODipine (NORVASC) 5 MG tablet Take 5 mg by mouth daily.  11/09/12  Yes Historical Provider, MD  atorvastatin (LIPITOR) 20 MG tablet Take 20 mg by mouth at bedtime.   Yes Historical Provider, MD  clobetasol (TEMOVATE) 0.05 % ointment Apply 1 application topically daily. Use as directed on legs 04/29/11  Yes Historical Provider, MD  ferrous sulfate 325 (65 FE) MG tablet Take 325 mg by mouth 3 (three) times daily with meals. 08/29/12  Yes Gwenyth Bender, MD  furosemide (LASIX) 40 MG tablet Take 1 tablet (40 mg total) by mouth 2 (two) times daily. 08/29/12  Yes Gwenyth Bender, MD  insulin aspart (NOVOLOG) 100 UNIT/ML injection Inject 0-20 Units into the  skin 3 (three) times daily before meals.   Yes Historical Provider, MD  insulin glargine (LANTUS) 100 UNIT/ML injection Inject 22 Units into the skin 2 (two) times daily. 08/29/12  Yes Gwenyth Bender, MD  levofloxacin (LEVAQUIN) 250 MG tablet Take 250 mg by mouth daily.   Yes Historical Provider, MD  olmesartan (BENICAR) 20 MG tablet Take 20 mg by mouth daily.   Yes Historical Provider, MD  pantoprazole (PROTONIX) 40 MG tablet Take 1 tablet (40 mg total) by mouth daily. 08/29/12  Yes Gwenyth Bender, MD  spironolactone (ALDACTONE) 25 MG tablet Take 25 mg by mouth daily.     Yes Historical Provider, MD  warfarin (COUMADIN) 6 MG tablet Take 3 mg by mouth daily.    Yes Historical Provider, MD  zolpidem (AMBIEN) 5 MG tablet Take 5 mg by mouth at bedtime as needed. For sleep   Yes Historical Provider, MD  oxyCODONE-acetaminophen (PERCOCET/ROXICET) 5-325 MG per tablet Take 1 tablet by mouth every 8 (eight) hours as needed (Pt states she is on this med at home for chronic Knee pain). 08/29/12   Gwenyth Bender, MD   Physical Exam: Filed Vitals:   02/11/13 0400 02/11/13 0430 02/11/13 0500 02/11/13 0530  BP: 174/92 187/84 190/76 172/80  Pulse: 67 71 77 72  Temp:      TempSrc:      Resp: 16 25 21 24   SpO2: 100% 99% 98% 100%    General:  Morbidly obese, mild respiratory distress Eyes: PEERLA EOMI ENT: mucous membranes moist Neck: supple w/o JVD Cardiovascular: irregularly irregular w/o MRG Respiratory: rales in lower third of lung fields, diminished breath sounds due to body habitus Abdomen: soft, nt, nd, bs+ Skin: 4+ pitting edema BLE with psoriatic skin changes Musculoskeletal: MAE, full ROM all 4 extremities Psychiatric: normal tone and affect Neurologic: AAOx3, grossly non-focal  Labs on Admission:  Basic Metabolic Panel:  Recent Labs Lab 02/11/13 0305  NA 142  K 4.9  CL 109  CO2 24  GLUCOSE 132*  BUN 30*  CREATININE 2.30*  CALCIUM 8.6   Liver Function Tests: No results found for this  basename: AST, ALT, ALKPHOS, BILITOT, PROT, ALBUMIN,  in the last 168 hours No results found for this basename: LIPASE, AMYLASE,  in the last 168 hours No results found for this basename: AMMONIA,  in the last 168 hours CBC:  Recent Labs Lab 02/11/13 0305  WBC 9.6  NEUTROABS 8.0*  HGB 7.5*  HCT 25.2*  MCV 75.7*  PLT 285   Cardiac Enzymes:  Recent Labs Lab 02/11/13 0305  TROPONINI <0.30    BNP (last 3 results)  Recent Labs  08/15/12 0952 08/17/12 0415 02/11/13 0305  PROBNP 1214.0* 2693.0* 1719.0*   CBG: No results found for this basename: GLUCAP,  in the last 168 hours  Radiological Exams on Admission: Dg Chest 2 View  02/11/2013  *RADIOLOGY REPORT*  Clinical Data: Shortness of breath.  CHEST - 2 VIEW  Comparison: Chest radiograph performed 08/15/2012  Findings: The lungs are mildly hypoexpanded.  Bibasilar opacities may reflect mild pulmonary edema or possibly pneumonia.  Underlying vascular crowding and vascular congestion are seen.  No definite pleural effusion or pneumothorax is identified.  The heart is mildly enlarged.  No acute osseous abnormalities are seen.  IMPRESSION: Lungs mildly hypoexpanded.  Vascular congestion and mild cardiomegaly; bibasilar opacities may reflect mild pulmonary edema or possibly pneumonia.   Original Report Authenticated By: Tonia Ghent, M.D.     EKG: Independently reviewed.  Assessment/Plan Principal Problem:   Acute combined systolic and diastolic heart failure Active Problems:   OBSTRUCTIVE SLEEP APNEA   HYPERTENSION   ATRIAL FIBRILLATION WITH RAPID VENTRICULAR RESPONSE   Obesity hypoventilation syndrome   Prosthetic joint infection   DM (diabetes mellitus)   Chronic anticoagulation   1. Acute CHF - checking EF with 2d echo, Lasix 60 IV q8h, with just 40 IV already seeing very good UOP, strict intake and output. BIPAP as well as Nitro GTT ordered to further help her breathing as well as BP. 2. OSA HVOS - ordering bipap for  now, plan to continue home CPAP settings when pulmonary edema resolved.  Also put in consult for SW to see patient because while she has home O2 she doesn't have a portable unit. 3. HTN - continue home meds, more acutely will use nitro gtt to control. 4. A.Fib - rate controlled, continue home meds, on coumadin as well, tele monitor. 5. CKD stage 3 - kidney function actually looks to be slightly better than her baseline right now with current creatinine of 2.3.  Monitor with daily labs. 6. DM - continue home lantus, putting patient on high dose SSI 7. Chronic prosthetic joint infection - continue chronic levaquin.    Code Status: Full Code (must indicate code status--if unknown or must be presumed, indicate so) Family Communication: Spoke with husband at bedside (indicate person spoken with, if applicable, with phone number if by telephone) Disposition Plan: Admit to inpatient (indicate anticipated LOS)  Time spent: 70 min  GARDNER, JARED M. [facility] Pager [phone]  If 7PM-7AM, please contact night-coverage [email or web address] Password [email or web address] 02/11/2013, 5:42 AM

## 2013-02-11 NOTE — ED Notes (Signed)
Per pt, shortness of breath, cough, congestion for a week.  Pt wears home oxygen but does not have travel oxygen.  RA sats 92%.  Placed on 2l per [place] and sats increased to 100%.  Pt SOB on assessment.

## 2013-02-11 NOTE — Progress Notes (Signed)
Pt has been OFF of bipap since this morning tolerating 2L [place] well, Sats. 100%, no respiratory distress noted, BS bilateral diminished throughout, HR 68, RR 22, BP 172/58, RT to monitor.

## 2013-02-11 NOTE — Progress Notes (Signed)
ANTICOAGULATION CONSULT NOTE - Initial Consult  Pharmacy Consult for warfarin Indication: atrial fibrillation  Allergies  Allergen Reactions  . Daptomycin Other (See Comments)    Elevated CPK  . Morphine And Related Nausea And Vomiting  . Cephalexin Rash    unknown  . Celecoxib     unknown  . Codeine     unknown  . Fluoxetine Hcl     unknown  . Latex     REACTION: undefined  . Ofloxacin     unknown  . Penicillins     unknown  . Rofecoxib     unknown  . Sulfonamide Derivatives     unknown    Patient Measurements:   Heparin Dosing Weight:   Vital Signs: Temp: 97.4 F (36.3 C) (03/06 0237) Temp src: Oral (03/06 0237) BP: 178/77 mmHg (03/06 0600) Pulse Rate: 65 (03/06 0600)  Labs:  Recent Labs  02/11/13 0305  HGB 7.5*  HCT 25.2*  PLT 285  LABPROT 19.5*  INR 1.71*  CREATININE 2.30*  TROPONINI <0.30    The CrCl is unknown because both a height and weight (above a minimum accepted value) are required for this calculation.   Medical History: Past Medical History  Diagnosis Date  . Asthma   . Bronchitis   . Allergic rhinitis   . Diabetes mellitus   . HTN (hypertension)   . DJD (degenerative joint disease)   . Hyperlipidemia   . Complication of anesthesia     trouble waking up  . Obesity   . OSA (obstructive sleep apnea)     poor compliance with cpap  . CHF (congestive heart failure)   . Biventricular failure     compensated  . Psoriasis   . Stasis edema     of lower extremities  . Depression   . Situational stress   . Stroke   . Kidney stones   . Kidney disease, chronic, stage III (GFR 30-59 ml/min)   . Anemia   . Gout   . Chronic anticoagulation   . Yeast infection involving the vagina and surrounding area   . Atrial fibrillation with RVR     Medications:   (Not in a hospital admission)  Assessment: Patient on chronic warfarain for afib.  INR < 2.    Goal of Therapy:  INR 2-3    Plan:  Warfarin 7.5mg  po x1 Daily  INR  [name] 02/11/2013,6:25 AM

## 2013-02-11 NOTE — Progress Notes (Signed)
Nutrition Brief Note  Patient identified on the Malnutrition Screening Tool (MST) Report  Body mass index is 48.32 kg/(m^2). Patient meets criteria for Obese based on current BMI.   Current diet order is Carb Modified Medium, patient is consuming approximately 80% of meals at this time. Pt reports that her appetite is okay now- she has a varied appetite at home, eats 2 or 3 meals daily, and has been able to maintain her weight. Per pt, usual body weight is 279 lbs but, she states that was a long time ago. Pt states she tries to follow a diabetic diet at home and uses insulin to control her blood glucose levels. Pt has no questions about a diabetic diet. Pt reports occasional constipation for which she takes Miralax as needed; discussed diet tips to relieve constipation. Pt has no questions or concerns at this time. Encouraged po intake >75% of meals. Labs and medications reviewed.   No nutrition interventions warranted at this time. If nutrition issues arise, please consult RD.   Ian Malkin RD, LDN Inpatient Clinical Dietitian Pager: [phone] After Hours Pager: [phone]

## 2013-02-11 NOTE — ED Provider Notes (Signed)
History     CSN: [id number]  Arrival date & time 02/11/13  0227   First MD Initiated Contact with Patient 02/11/13 0239      Chief Complaint  Patient presents with  . Shortness of Breath    (Consider location/radiation/quality/duration/timing/severity/associated sxs/prior treatment) HPI 62 year old female presents to emergency room from home via EMS with complaint of shortness of breath. She reports that she had cough and shortness of breath with wheezing started on Monday. She used her last dose of albuterol on Tuesday and felt somewhat better. She denies missing any medication. She denies any change in her diet. She does not weigh herself regularly and does not know if she is gone up in her weight. She has chronic swelling in her lower extremities which is at her baseline. She denies any fever. Cough is nonproductive. She is no longer able to lay flat due to shortness of breath. She is limited to short distances when walking due to shortness of breath. Patient took an extra dose of her Lasix without improvement in her symptoms. She denies any chest pain.  Past Medical History  Diagnosis Date  . Asthma   . Bronchitis   . Allergic rhinitis   . Diabetes mellitus   . HTN (hypertension)   . DJD (degenerative joint disease)   . Hyperlipidemia   . Complication of anesthesia     trouble waking up  . Obesity   . OSA (obstructive sleep apnea)     poor compliance with cpap  . CHF (congestive heart failure)   . Biventricular failure     compensated  . Psoriasis   . Stasis edema     of lower extremities  . Depression   . Situational stress   . Stroke   . Kidney stones   . Kidney disease, chronic, stage III (GFR 30-59 ml/min)   . Anemia   . Gout   . Chronic anticoagulation   . Yeast infection involving the vagina and surrounding area   . Atrial fibrillation with RVR     Past Surgical History  Procedure Laterality Date  . Appendectomy    . Cholecystectomy    . Total knee  arthroplasty    . Total abdominal hysterectomy    . Back surgery    . Cystectomy      left hand    Family History  Problem Relation Age of Onset  . Heart attack Father   . Asthma Father   . Heart disease Paternal Uncle   . Rectal cancer Paternal Aunt   . Other Mother     mva    History  Substance Use Topics  . Smoking status: Former Smoker    Types: Cigarettes  . Smokeless tobacco: Never Used     Comment: smoked for 1.5 yrs about 2 cigs a day ,only if stressed  . Alcohol Use: No    OB History   Grav Para Term Preterm Abortions TAB SAB Ect Mult Living                  Review of Systems  All other systems reviewed and are negative.    Allergies  Daptomycin; Morphine and related; Cephalexin; Celecoxib; Codeine; Fluoxetine hcl; Latex; Ofloxacin; Penicillins; Rofecoxib; and Sulfonamide derivatives  Home Medications   Current Outpatient Rx  Name  Route  Sig  Dispense  Refill  . albuterol (PROVENTIL HFA;VENTOLIN HFA) 108 (90 BASE) MCG/ACT inhaler   Inhalation   Inhale 2 puffs into the lungs every 6 (  six) hours as needed. For shortness of breath         . allopurinol (ZYLOPRIM) 100 MG tablet   Oral   Take 1 tablet by mouth daily.         [name] amiodarone (PACERONE) 200 MG tablet   Oral   Take 200 mg by mouth daily.         [name] amLODipine (NORVASC) 5 MG tablet               . aspirin 81 MG tablet   Oral   Take 81 mg by mouth daily.          [name] atorvastatin (LIPITOR) 20 MG tablet   Oral   Take 20 mg by mouth at bedtime.         . clobetasol (TEMOVATE) 0.05 % ointment   Topical   Apply 1 application topically daily. Use as directed on legs         . digoxin (LANOXIN) 0.125 MG tablet   Oral   Take 1 tablet by mouth daily.         . ferrous sulfate 325 (65 FE) MG tablet   Oral   Take 1 tablet (325 mg total) by mouth 3 (three) times daily with meals.   90 tablet   2   . furosemide (LASIX) 40 MG tablet   Oral   Take 1 tablet (40 mg total)  by mouth 2 (two) times daily.   60 tablet   2   . insulin aspart (NOVOLOG) 100 UNIT/ML injection   Subcutaneous   Inject 0-20 Units into the skin 3 (three) times daily before meals.         . insulin glargine (LANTUS) 100 UNIT/ML injection   Subcutaneous   Inject 22 Units into the skin 2 (two) times daily.   10 mL   3   . levofloxacin (LEVAQUIN) 250 MG tablet   Oral   Take 1 tablet (250 mg total) by mouth daily.   30 tablet   11   . olmesartan (BENICAR) 20 MG tablet   Oral   Take 20 mg by mouth daily.         [name] oxyCODONE-acetaminophen (PERCOCET/ROXICET) 5-325 MG per tablet   Oral   Take 1 tablet by mouth every 8 (eight) hours as needed (Pt states she is on this med at home for chronic Knee pain).   50 tablet   0   . pantoprazole (PROTONIX) 40 MG tablet   Oral   Take 1 tablet (40 mg total) by mouth daily.   30 tablet   2   . polyethylene glycol (MIRALAX / GLYCOLAX) packet   Oral   Take 17 g by mouth 2 (two) times daily.   30 each   4   . spironolactone (ALDACTONE) 25 MG tablet   Oral   Take 25 mg by mouth daily.           [name] warfarin (COUMADIN) 6 MG tablet   Oral   Take 6 mg by mouth daily.         [name] zolpidem (AMBIEN) 5 MG tablet   Oral   Take 5 mg by mouth at bedtime as needed. For sleep           BP 179/83  Pulse 63  Temp(Src) 97.4 F (36.3 C) (Oral)  Resp 28  SpO2 100%  Physical Exam  Constitutional: She is oriented to person, place, and time.  Morbidly obese female  in mild respiratory distress  HENT:  Head: Normocephalic and atraumatic.  Nose: Nose normal.  Mouth/Throat: Oropharynx is clear and moist.  Eyes: Conjunctivae and EOM are normal. Pupils are equal, round, and reactive to light.  Neck: Normal range of motion. Neck supple. No JVD present. No tracheal deviation present. No thyromegaly present.  Cardiovascular: Normal rate, regular rhythm, normal heart sounds and intact distal pulses.  Exam reveals no gallop and no friction  rub.   No murmur heard. Pulmonary/Chest: Effort normal. No stridor. She has no wheezes. She has rales. She exhibits no tenderness.  Patient with overall diminished breath sounds due to body habitus, but rales heard in the lower one third of the lung fields. Patient with dry cough  Abdominal: Soft. Bowel sounds are normal. She exhibits no distension and no mass. There is no tenderness. There is no rebound and no guarding.  Musculoskeletal: She exhibits edema (edema of lower sternum and these with overlying skin changes consistent with chronic venous stasis).  Lymphadenopathy:    She has no cervical adenopathy.  Neurological: She is alert and oriented to person, place, and time.  Skin: Skin is warm and dry. She is not diaphoretic. No erythema. No pallor.  Psychiatric: She has a normal mood and affect. Her behavior is normal. Judgment and thought content normal.    ED Course  Procedures (including critical care time)  Labs Reviewed  CBC WITH DIFFERENTIAL - Abnormal; Notable for the following:    RBC 3.33 (*)    Hemoglobin 7.5 (*)    HCT 25.2 (*)    MCV 75.7 (*)    MCH 22.5 (*)    MCHC 29.8 (*)    RDW 20.2 (*)    Neutrophils Relative 83 (*)    Neutro Abs 8.0 (*)    Lymphocytes Relative 10 (*)    All other components within normal limits  BASIC METABOLIC PANEL - Abnormal; Notable for the following:    Glucose, Bld 132 (*)    BUN 30 (*)    Creatinine, Ser 2.30 (*)    GFR calc non Af Amer 22 (*)    GFR calc Af Amer 25 (*)    All other components within normal limits  PROTIME-INR - Abnormal; Notable for the following:    Prothrombin Time 19.5 (*)    INR 1.71 (*)    All other components within normal limits  PRO B NATRIURETIC PEPTIDE - Abnormal; Notable for the following:    Pro B Natriuretic peptide (BNP) 1719.0 (*)    All other components within normal limits  URINALYSIS, MICROSCOPIC ONLY - Abnormal; Notable for the following:    APPearance CLOUDY (*)    Protein, ur 100 (*)     All other components within normal limits  TROPONIN I   Dg Chest 2 View  02/11/2013  *RADIOLOGY REPORT*  Clinical Data: Shortness of breath.  CHEST - 2 VIEW  Comparison: Chest radiograph performed 08/15/2012  Findings: The lungs are mildly hypoexpanded.  Bibasilar opacities may reflect mild pulmonary edema or possibly pneumonia.  Underlying vascular crowding and vascular congestion are seen.  No definite pleural effusion or pneumothorax is identified.  The heart is mildly enlarged.  No acute osseous abnormalities are seen.  IMPRESSION: Lungs mildly hypoexpanded.  Vascular congestion and mild cardiomegaly; bibasilar opacities may reflect mild pulmonary edema or possibly pneumonia.   Original Report Authenticated By: Tonia Ghent, M.D.      Date: 02/11/2013  Rate: 59  Rhythm: normal sinus rhythm  QRS  Axis: normal  Intervals: normal  ST/T Wave abnormalities: nonspecific T wave changes  Conduction Disutrbances:none  Narrative Interpretation:   Old EKG Reviewed: unchanged    1. CHF exacerbation   2. Anemia   3. Renal insufficiency       MDM  62 year old female with worsening shortness of breath over the last 3 days. Patient with orthopnea, dyspnea on exertion. She has any chest pain. She has history of congestive heart failure as well as asthma and sleep apnea. She reports she has run out of her albuterol and her CPAP machine is nonfunctional. He sent her exam and x-ray, and concern for congestive heart failure exacerbation. We'll get labs, treat with IV Lasix and expect admission.        Olivia Mackie, MD 02/11/13 [phone]

## 2013-02-11 NOTE — Progress Notes (Signed)
  Echocardiogram 2D Echocardiogram has been performed.  [name] 02/11/2013, 10:49 AM

## 2013-02-11 NOTE — Progress Notes (Signed)
Patient seen and examined at bedside. Please see earlier admission note by Dr. Julian Reil. I have reviewed current blood work and vital signs. Agree with diagnoses of congestive heart failure. 2-D echogram ordered. followup on the results. We'll continue Lasix IV for now and will monitor daily weights, intake and output. Will obtain BMP and CBC in the morning.  Debbora Presto, MD  [facility] Pager [phone]  If 7PM-7AM, please contact night-coverage [email or web address] Password [email or web address]

## 2013-02-11 NOTE — Progress Notes (Addendum)
ANTICOAGULATION CONSULT NOTE - Follow Up Consult  Pharmacy Consult for warfarin Indication: atrial fibrillation  Allergies  Allergen Reactions  . Daptomycin Other (See Comments)    Elevated CPK  . Morphine And Related Nausea And Vomiting  . Cephalexin Rash    unknown  . Celecoxib     unknown  . Codeine     unknown  . Fluoxetine Hcl     unknown  . Latex     REACTION: undefined  . Ofloxacin     unknown  . Penicillins     unknown  . Rofecoxib     unknown  . Sulfonamide Derivatives     unknown    Patient Measurements: Height: 5\' 3"  (160 cm) Weight: 272 lb 11.3 oz (123.7 kg) IBW/kg (Calculated) : 52.4 Heparin Dosing Weight:   Vital Signs: Temp: 98.9 F (37.2 C) (03/06 0800) Temp src: Axillary (03/06 0800) BP: 163/72 mmHg (03/06 0930) Pulse Rate: 67 (03/06 0930)  Labs:  Recent Labs  02/11/13 0305 02/11/13 0600  HGB 7.5*  --   HCT 25.2*  --   PLT 285  --   LABPROT 19.5*  --   INR 1.71*  --   CREATININE 2.30*  --   TROPONINI <0.30 <0.30    Estimated Creatinine Clearance: 32.4 ml/min (by C-G formula based on Cr of 2.3).   Medical History: Past Medical History  Diagnosis Date  . Asthma   . Bronchitis   . Allergic rhinitis   . Diabetes mellitus   . HTN (hypertension)   . DJD (degenerative joint disease)   . Hyperlipidemia   . Complication of anesthesia     trouble waking up  . Obesity   . OSA (obstructive sleep apnea)     poor compliance with cpap  . CHF (congestive heart failure)   . Biventricular failure     compensated  . Psoriasis   . Stasis edema     of lower extremities  . Depression   . Situational stress   . Stroke   . Kidney stones   . Kidney disease, chronic, stage III (GFR 30-59 ml/min)   . Anemia   . Gout   . Chronic anticoagulation   . Yeast infection involving the vagina and surrounding area   . Atrial fibrillation with RVR     Medications:  Prescriptions prior to admission  Medication Sig Dispense Refill  . albuterol  (PROVENTIL HFA;VENTOLIN HFA) 108 (90 BASE) MCG/ACT inhaler Inhale 2 puffs into the lungs every 6 (six) hours as needed. For shortness of breath      . allopurinol (ZYLOPRIM) 100 MG tablet Take 1 tablet by mouth daily.      [name] amiodarone (PACERONE) 200 MG tablet Take 200 mg by mouth daily.      [name] amLODipine (NORVASC) 5 MG tablet Take 5 mg by mouth daily.       [name] atorvastatin (LIPITOR) 20 MG tablet Take 20 mg by mouth at bedtime.      . clobetasol (TEMOVATE) 0.05 % ointment Apply 1 application topically daily. Use as directed on legs      . ferrous sulfate 325 (65 FE) MG tablet Take 325 mg by mouth 3 (three) times daily with meals.      . furosemide (LASIX) 40 MG tablet Take 1 tablet (40 mg total) by mouth 2 (two) times daily.  60 tablet  2  . insulin aspart (NOVOLOG) 100 UNIT/ML injection Inject 0-20 Units into the skin 3 (three) times daily before meals.      [name]  insulin glargine (LANTUS) 100 UNIT/ML injection Inject 22 Units into the skin 2 (two) times daily.  10 mL  3  . levofloxacin (LEVAQUIN) 250 MG tablet Take 250 mg by mouth daily.      [name] olmesartan (BENICAR) 20 MG tablet Take 20 mg by mouth daily.      . pantoprazole (PROTONIX) 40 MG tablet Take 1 tablet (40 mg total) by mouth daily.  30 tablet  2  . spironolactone (ALDACTONE) 25 MG tablet Take 25 mg by mouth daily.        [name] warfarin (COUMADIN) 6 MG tablet Take 3 mg by mouth daily.       [name] zolpidem (AMBIEN) 5 MG tablet Take 5 mg by mouth at bedtime as needed. For sleep      . oxyCODONE-acetaminophen (PERCOCET/ROXICET) 5-325 MG per tablet Take 1 tablet by mouth every 8 (eight) hours as needed (Pt states she is on this med at home for chronic Knee pain).  50 tablet  0    Assessment:  Patient on chronic warfarain for afib.   Home dose 3mg  daily using 6mg  tabs  INR currently subtherapeutic  No reported bleeding    Goal of Therapy:  INR 2-3    Plan:  Note that I am adjusting the previous pharmacists 7.5mg  dose that was ordered due to  home dose only 3mg  daily - Warfarin 5mg  po x1 due to INR < 2 Daily INR   Hessie Knows, PharmD, BCPS Pager [phone] 02/11/2013 10:02 AM

## 2013-02-12 DIAGNOSIS — I5031 Acute diastolic (congestive) heart failure: Secondary | ICD-10-CM

## 2013-02-12 LAB — GLUCOSE, CAPILLARY
Glucose-Capillary: 154 mg/dL — ABNORMAL HIGH (ref 70–99)
Glucose-Capillary: 118 mg/dL — ABNORMAL HIGH (ref 70–99)

## 2013-02-12 LAB — BASIC METABOLIC PANEL
BUN: 39 mg/dL — ABNORMAL HIGH (ref 6–23)
Calcium: 8.5 mg/dL (ref 8.4–10.5)
GFR calc Af Amer: 20 mL/min — ABNORMAL LOW (ref >90)
GFR calc non Af Amer: 17 mL/min — ABNORMAL LOW (ref >90)
Potassium: 4.7 mEq/L (ref 3.5–5.1)
Sodium: 139 mEq/L (ref 135–145)

## 2013-02-12 LAB — CBC
MCHC: 29.1 g/dL — ABNORMAL LOW (ref 30.0–36.0)
MCV: 76.5 fL — ABNORMAL LOW (ref 78.0–100.0)
Platelets: 250 10*3/uL (ref 150–400)
RDW: 20.1 % — ABNORMAL HIGH (ref 11.5–15.5)
WBC: 6.9 10*3/uL (ref 4.0–10.5)

## 2013-02-12 MED ORDER — HYDRALAZINE HCL 20 MG/ML IJ SOLN
10.0000 mg | Freq: Four times a day (QID) | INTRAMUSCULAR | Status: DC | PRN
Start: 2013-02-12 — End: 2013-02-18

## 2013-02-12 MED ORDER — FUROSEMIDE 10 MG/ML IJ SOLN: 40.0000 mg | Freq: Every day | INTRAMUSCULAR | Status: DC

## 2013-02-12 MED ORDER — LEVOFLOXACIN 750 MG PO TABS
750.0000 mg | ORAL_TABLET | ORAL | Status: DC
  Administered 2013-02-13 – 2013-02-17 (×3): 750 mg via ORAL
  Filled 2013-02-12 (×3): qty 1

## 2013-02-12 MED ORDER — WARFARIN SODIUM 5 MG PO TABS
5.0000 mg | ORAL_TABLET | Freq: Once | ORAL | Status: AC
  Administered 2013-02-12: 5 mg via ORAL
  Filled 2013-02-12: qty 1

## 2013-02-12 MED ORDER — HYDRALAZINE HCL 25 MG PO TABS
25.0000 mg | ORAL_TABLET | Freq: Three times a day (TID) | ORAL | Status: DC
  Administered 2013-02-12 (×2): 25 mg via ORAL
  Filled 2013-02-12 (×6): qty 1

## 2013-02-12 MED ORDER — FUROSEMIDE 10 MG/ML IJ SOLN
40.0000 mg | Freq: Every day | INTRAMUSCULAR | Status: DC
  Administered 2013-02-12: 40 mg via INTRAVENOUS

## 2013-02-12 MED ORDER — FUROSEMIDE 10 MG/ML IJ SOLN
40.0000 mg | Freq: Two times a day (BID) | INTRAMUSCULAR | Status: DC
  Administered 2013-02-12 – 2013-02-13 (×2): 40 mg via INTRAVENOUS
  Filled 2013-02-12 (×3): qty 4

## 2013-02-12 MED ORDER — ALBUTEROL SULFATE (5 MG/ML) 0.5% IN NEBU: 2.5000 mg | INHALATION_SOLUTION | RESPIRATORY_TRACT | Status: DC | PRN

## 2013-02-12 MED ORDER — LEVOFLOXACIN 500 MG PO TABS: 500.0000 mg | ORAL_TABLET | ORAL | Status: DC

## 2013-02-12 NOTE — Progress Notes (Addendum)
ANTIBIOTIC CONSULT NOTE - INITIAL  Pharmacy Consult for Levaquin, antibiotic renal dose adjustment  Indication: rule out pneumonia  Allergies  Allergen Reactions  . Daptomycin Other (See Comments)    Elevated CPK  . Morphine And Related Nausea And Vomiting  . Cephalexin Rash    unknown  . Celecoxib     unknown  . Codeine     unknown  . Fluoxetine Hcl     unknown  . Latex     REACTION: undefined  . Ofloxacin     unknown  . Penicillins     unknown  . Rofecoxib     unknown  . Sulfonamide Derivatives     unknown    Patient Measurements: Height: 5\' 3"  (160 cm) Weight: 272 lb 11.3 oz (123.7 kg) IBW/kg (Calculated) : 52.4  Vital Signs: Temp: 97.2 F (36.2 C) (03/07 1200) Temp src: Axillary (03/07 1200) BP: 148/56 mmHg (03/07 1315) Pulse Rate: 59 (03/07 1315)  Labs:  Recent Labs  02/11/13 0305 02/12/13 0340  WBC 9.6 6.9  HGB 7.5* 7.2*  PLT 285 250  CREATININE 2.30* 2.75*   Estimated Creatinine Clearance: 27.1 ml/min (by C-G formula based on Cr of 2.75).   Microbiology: Recent Results (from the past 720 hour(s))  MRSA PCR SCREENING     Status: None   Collection Time    02/11/13  7:03 AM      Result Value Range Status   MRSA by PCR NEGATIVE  NEGATIVE Final   Comment:            The GeneXpert MRSA Assay (FDA     approved for NASAL specimens     only), is one component of a     comprehensive MRSA colonization     surveillance program. It is not     intended to diagnose MRSA     infection nor to guide or     monitor treatment for     MRSA infections.     Medications:  Anti-infectives   Start     Dose/Rate Route Frequency Ordered Stop   02/11/13 1000  levofloxacin (LEVAQUIN) tablet 250 mg     250 mg Oral Daily 02/11/13 0539       Assessment:  61 yof admit admit 3/6 with SOB and CHF. Patient is on chronic levaquin per [facility] for suprressive therapy of prosthetic joint infection.  Pt is now also r/o pneumonia and has acute on chronic renal  failure.  SCr 2.75, CrCl (N) ~ 24 ml/min  Afebrile, WBC wnl  Blood cxt (3/7) in process  Goal of Therapy:  Appropriate abx dosing, eradication of infection.  Plan:   Change to Levaquin 750mg  PO q48h  Follow up renal function and cultures as available.  Plan to resume Levaquin 250mg  PO daily at discharge for ongoing suppressive therapy and if renal function has normalized.   Lynann Beaver PharmD, BCPS Pager [phone] 02/12/2013 1:51 PM

## 2013-02-12 NOTE — Progress Notes (Addendum)
ANTICOAGULATION CONSULT NOTE - Follow Up Consult  Pharmacy Consult for warfarin Indication: atrial fibrillation  Allergies  Allergen Reactions  . Daptomycin Other (See Comments)    Elevated CPK  . Morphine And Related Nausea And Vomiting  . Cephalexin Rash    unknown  . Celecoxib     unknown  . Codeine     unknown  . Fluoxetine Hcl     unknown  . Latex     REACTION: undefined  . Ofloxacin     unknown  . Penicillins     unknown  . Rofecoxib     unknown  . Sulfonamide Derivatives     unknown    Patient Measurements: Height: 5\' 3"  (160 cm) Weight: 272 lb 11.3 oz (123.7 kg) IBW/kg (Calculated) : 52.4  Vital Signs: Temp: 98.9 F (37.2 C) (03/07 0400) Temp src: Axillary (03/07 0400) BP: 156/57 mmHg (03/07 0700) Pulse Rate: 56 (03/07 0700)  Labs:  Recent Labs  02/11/13 0305 02/11/13 0600 02/11/13 1200 02/11/13 1726 02/12/13 0340  HGB 7.5*  --   --   --  7.2*  HCT 25.2*  --   --   --  24.7*  PLT 285  --   --   --  250  LABPROT 19.5*  --   --   --  19.7*  INR 1.71*  --   --   --  1.73*  CREATININE 2.30*  --   --   --  2.75*  TROPONINI <0.30 <0.30 <0.30 <0.30  --     Estimated Creatinine Clearance: 27.1 ml/min (by C-G formula based on Cr of 2.75).   Medications:  . allopurinol  100 mg Oral Daily  . amiodarone  200 mg Oral Daily  . amLODipine  5 mg Oral Daily  . atorvastatin  20 mg Oral QHS  . ferrous sulfate  325 mg Oral TID WC  . furosemide  60 mg Intravenous Q8H  . insulin aspart  0-20 Units Subcutaneous TID WC  . insulin glargine  22 Units Subcutaneous BID  . irbesartan  150 mg Oral Daily  . levofloxacin  250 mg Oral Daily  . pantoprazole  40 mg Oral Daily  . sodium chloride  3 mL Intravenous Q12H  . spironolactone  25 mg Oral Daily  . [COMPLETED] warfarin  5 mg Oral ONCE-1800  . Warfarin - Pharmacist Dosing Inpatient   Does not apply q1800  . [DISCONTINUED] warfarin  7.5 mg Oral ONCE-1800     Assessment:  61 yof admit 3/6 with SOB and  CHF.  Patient is on chronic warfarain for afib.  Home dose was 3mg  daily and INR on admission was subtherapeutic at 1.71.  INR (1.73) remains subtherapeutic  Hgb is low at 7.2, Plt wnl.  No reported bleeding    Noted potential drug-drug interactions with warfarin:  Amiodarone, levaquin, allopurionol (may prolong INR).  Goal of Therapy:  INR 2-3    Plan:   Warfarin 5mg  PO today  Daily INR, CBC  Lynann Beaver PharmD, BCPS Pager [phone] 02/12/2013 8:59 AM

## 2013-02-12 NOTE — Progress Notes (Signed)
Patient ID: [name], female   DOB: 1951-10-29, 62 y.o.   MRN: [id number]  [facility] PROGRESS NOTE  NAKYIAH KUCK WUJ:[id number] DOB: 1951-03-31 DOA: 02/11/2013 PCP: [name] [name], MD  Brief narrative: Pt is 62 y.o. female who presented to the ED from home via EMS with main concern of progressively worsening SOB that initially started 3-4 days prior to admission and has been associated with non productive cough. Pt denies any specific alleviating factors including the use of Albuterol inhaler. Pt explained she used extra dose of Lasix but that did not help either.She noticed that dyspnea was worse with exertion. Pt denied any specific chest pain, fevers, chills. She also explains that LE swelling is at her baseline.  In the ED lab workup, clinical exam, and CXR consistent with exacerbation of CHF, Hospitalist has been asked to admit for CHF exacerbation.  Principal Problem:   Acute combined systolic and diastolic heart failure - appears to be clinically improving and maintaining oxygen saturations at target range - will decrease the frequency of IV Lasix, continue to monitor daily weights, and I's and O's - 2D ECHO done and findings pending, will follow up on results - close monitoring of renal function in the setting of diuresis - BMP and BNP in AM Active Problems:   Acute on chronic renal failure  - pt has chronic renal failure and review of records indicated baseline Cr 2.3 - 2.5 - pt has had higher creatinine levels in 3 - 3.5 - creatinine is up from yesterday due to Lasix - will lower the dose and frequency today and will repeat BMP in AM   Acute hypoxic respiratory failure - likely multifactorial and secondary to CHF, ? PNA, Asthma   - this appears to be clinically stable but PNA may be complicating the clinical status - no wheezing on exam this AM, continue nebulizer as needed  - Levaquin day #2  - sputum culture and analysis pending   OBSTRUCTIVE SLEEP APNEA,  OHS - CPAP at night time   HYPERTENSION, malignant - much better controlled this AM - will plan on tapering off nitro drip - discontinue ARB due to renal failure - add Hydralazine scheduled PO and IV as needed for better control    ATRIAL FIBRILLATION WITH RAPID VENTRICULAR RESPONSE - rate controlled, continue Amiodarone and Coumadin    DM (diabetes mellitus), uncontrolled with complications of neuropathy - continue Lantus and SSI   Chronic anticoagulation - coumadin per pharmacy    Anemia of chronic disease - slight drop in Hg, pt reluctant on transfusion - plan on transfusing if Hg < 7 - anemia panel and CBC in AM   Consultants:  None  Procedures/Studies: Dg Chest 2 View 02/11/2013  Lungs hypoexpanded.  Vascular congestion, cardiomegaly, mild pulmonary edema, ? pneumonia.   Antibiotics:  Levaquin 03/06 -->   Code Status: Full Family Communication: Pt at bedside and no other family members present  Disposition Plan: Keep in SDU until off nitro drip   HPI/Subjective: No events overnight.   Objective: Filed Vitals:   02/12/13 0800 02/12/13 0900 02/12/13 0930 02/12/13 1000  BP: 163/62 153/42 137/98 159/52  Pulse: 54 60 63 63  Temp: 97.7 F (36.5 C)     TempSrc: Oral     Resp: 16 19 20 20   Height:      Weight:      SpO2: 100% 100% 100% 100%    Intake/Output Summary (Last 24 hours) at 02/12/13 1151 Last data filed at  02/12/13 1000  Gross per 24 hour  Intake 561.56 ml  Output   4410 ml  Net -3848.44 ml    Exam:   General:  Pt is alert, follows commands appropriately, not in acute distress  Cardiovascular: Irregular rate and rhythm, no murmurs, no rubs, no gallops  Respiratory: Clear to auscultation bilaterally, decreased breath sounds bilaterally but no wheezing   Abdomen: Soft, non tender, non distended, bowel sounds present, no guarding  Extremities: No edema, pulses DP and PT palpable bilaterally  Neuro: Grossly nonfocal  Data Reviewed: Basic  Metabolic Panel:  Recent Labs Lab 02/11/13 0305 02/12/13 0340  NA 142 139  K 4.9 4.7  CL 109 105  CO2 24 26  GLUCOSE 132* 100*  BUN 30* 39*  CREATININE 2.30* 2.75*  CALCIUM 8.6 8.5   CBC:  Recent Labs Lab 02/11/13 0305 02/12/13 0340  WBC 9.6 6.9  NEUTROABS 8.0*  --   HGB 7.5* 7.2*  HCT 25.2* 24.7*  MCV 75.7* 76.5*  PLT 285 250   Cardiac Enzymes:  Recent Labs Lab 02/11/13 0305 02/11/13 0600 02/11/13 1200 02/11/13 1726  TROPONINI <0.30 <0.30 <0.30 <0.30   CBG:  Recent Labs Lab 02/11/13 0804 02/11/13 1129 02/11/13 1712 02/11/13 2208 02/12/13 0805  GLUCAP 100* 99 114* 247* 154*    Recent Results (from the past 240 hour(s))  MRSA PCR SCREENING     Status: None   Collection Time    02/11/13  7:03 AM      Result Value Range Status   MRSA by PCR NEGATIVE  NEGATIVE Final   Comment:            The GeneXpert MRSA Assay (FDA     approved for NASAL specimens     only), is one component of a     comprehensive MRSA colonization     surveillance program. It is not     intended to diagnose MRSA     infection nor to guide or     monitor treatment for     MRSA infections.     Scheduled Meds: . allopurinol  100 mg Oral Daily  . amiodarone  200 mg Oral Daily  . amLODipine  5 mg Oral Daily  . atorvastatin  20 mg Oral QHS  . ferrous sulfate  325 mg Oral TID WC  . furosemide  60 mg Intravenous Q8H  . insulin aspart  0-20 Units Subcutaneous TID WC  . insulin glargine  22 Units Subcutaneous BID  . irbesartan  150 mg Oral Daily  . levofloxacin  250 mg Oral Daily  . pantoprazole  40 mg Oral Daily  . sodium chloride  3 mL Intravenous Q12H  . spironolactone  25 mg Oral Daily  . warfarin  5 mg Oral ONCE-1800  . Warfarin - Pharmacist Dosing Inpatient   Does not apply q1800   Continuous Infusions: . nitroGLYCERIN 30 mcg/min (02/12/13 0951)     Debbora Presto, MD  [facility] Pager [phone]  If 7PM-7AM, please contact night-coverage [email or web address] Password  [email or web address] 02/12/2013, 11:51 AM   LOS: 1 day

## 2013-02-12 NOTE — Plan of Care (Signed)
Problem: Phase I Progression Outcomes Goal: EF % per last Echo/documented,Core Reminder form on chart Outcome: Completed/Met Date Met:  02/12/13 Echo completed 02/11/13

## 2013-02-13 LAB — CBC
HCT: 25.6 % — ABNORMAL LOW (ref 36.0–46.0)
Hemoglobin: 7.5 g/dL — ABNORMAL LOW (ref 12.0–15.0)
RDW: 19.8 % — ABNORMAL HIGH (ref 11.5–15.5)
WBC: 7.8 10*3/uL (ref 4.0–10.5)

## 2013-02-13 LAB — BASIC METABOLIC PANEL
CO2: 30 mEq/L (ref 19–32)
GFR calc non Af Amer: 16 mL/min — ABNORMAL LOW (ref >90)
Glucose, Bld: 92 mg/dL (ref 70–99)
Potassium: 4.2 mEq/L (ref 3.5–5.1)
Sodium: 139 mEq/L (ref 135–145)

## 2013-02-13 LAB — GLUCOSE, CAPILLARY
Glucose-Capillary: 100 mg/dL — ABNORMAL HIGH (ref 70–99)
Glucose-Capillary: 106 mg/dL — ABNORMAL HIGH (ref 70–99)

## 2013-02-13 LAB — PROTIME-INR
INR: 1.69 — ABNORMAL HIGH (ref 0.00–1.49)
Prothrombin Time: 19.3 seconds — ABNORMAL HIGH (ref 11.6–15.2)

## 2013-02-13 LAB — PRO B NATRIURETIC PEPTIDE: Pro B Natriuretic peptide (BNP): 698.5 pg/mL — ABNORMAL HIGH (ref 0–125)

## 2013-02-13 MED ORDER — WARFARIN SODIUM 5 MG PO TABS
5.0000 mg | ORAL_TABLET | Freq: Once | ORAL | Status: AC
  Administered 2013-02-13: 5 mg via ORAL
  Filled 2013-02-13: qty 1

## 2013-02-13 MED ORDER — FUROSEMIDE 40 MG PO TABS
40.0000 mg | ORAL_TABLET | Freq: Two times a day (BID) | ORAL | Status: DC
  Administered 2013-02-13 – 2013-02-14 (×2): 40 mg via ORAL
  Filled 2013-02-13 (×4): qty 1

## 2013-02-13 MED ORDER — HYDRALAZINE HCL 50 MG PO TABS
50.0000 mg | ORAL_TABLET | Freq: Three times a day (TID) | ORAL | Status: DC
  Administered 2013-02-13 – 2013-02-18 (×16): 50 mg via ORAL
  Filled 2013-02-13 (×18): qty 1

## 2013-02-13 MED ORDER — VANCOMYCIN HCL 10 G IV SOLR
1500.0000 mg | Freq: Once | INTRAVENOUS | Status: AC
  Administered 2013-02-13: 1500 mg via INTRAVENOUS
  Filled 2013-02-13: qty 1500

## 2013-02-13 NOTE — Progress Notes (Signed)
[facility] called: Aerobic BC gm+ in clusters cocci. Notified on call MD.

## 2013-02-13 NOTE — Progress Notes (Addendum)
Patient ID: [name], female   DOB: May 13, 1951, 62 y.o.   MRN: [id number]  [facility] PROGRESS NOTE  [name] WNU:[id number] DOB: May 11, 1951 DOA: 02/11/2013 PCP: [name] [name], MD   Brief narrative:  Pt is 62 y.o. female who presented to the ED from home via EMS with main concern of progressively worsening SOB that initially started 3-4 days prior to admission and has been associated with non productive cough. Pt denies any specific alleviating factors including the use of Albuterol inhaler. Pt explained she used extra dose of Lasix but that did not help either.She noticed that dyspnea was worse with exertion. Pt denied any specific chest pain, fevers, chills. She also explains that LE swelling is at her baseline.   In the ED lab workup, clinical exam, and CXR consistent with exacerbation of CHF, Hospitalist has been asked to admit for CHF exacerbation.   Principal Problem:  Acute combined systolic and diastolic heart failure  - appears to be clinically improving and maintaining oxygen saturations at target range  - plan on transitioning IV Lasix to PO Lasix, continue to monitor daily weights, and I's and O's  - weight trend 272 lbs (03/06) --> 263 lbs (03/07), BNP trending down 1700 --> 690 - 2D ECHO 3/7 indicative of worsening systolic and diastolic function - close monitoring of renal function in the setting of diuresis  Active Problems:  Acute on chronic renal failure  - pt has chronic renal failure and review of records indicated baseline Cr 2.3 - 2.5  - pt has had higher creatinine levels in 3 - 3.5  - creatinine is up from yesterday due to Lasix  - will transition to PO Lasix today and will repeat BMP in AM  Acute hypoxic respiratory failure  - likely multifactorial and secondary to CHF, ? PNA, Asthma  - this appears to be clinically stable but PNA may be complicating the clinical status  - no wheezing on exam this AM, continue nebulizer as needed  - Levaquin  day #3 - sputum culture and analysis pending  OBSTRUCTIVE SLEEP APNEA, OHS  - CPAP at night time  HYPERTENSION, malignant  - much better controlled this AM  - off nitro drip  - discontinued ARB due to renal failure  - continue Hydralazine but will increase the dose to 50 mg TID ATRIAL FIBRILLATION WITH RAPID VENTRICULAR RESPONSE  - rate controlled, continue Amiodarone and Coumadin  DM (diabetes mellitus), uncontrolled with complications of neuropathy  - continue Lantus and SSI  Chronic anticoagulation  - coumadin per pharmacy  Anemia of chronic disease  - stable Hg, pt reluctant on transfusion  - plan on transfusing if Hg < 7  - CBC in AM   Consultants:  None Procedures/Studies:  Dg Chest 2 View 02/11/2013 Lungs hypoexpanded. Vascular congestion, cardiomegaly, mild pulmonary edema, ? pneumonia.  Antibiotics:  Levaquin 03/06 -->   Code Status: Full  Family Communication: Pt at bedside and no other family members present  Disposition Plan: Transfer to tele today  HPI/Subjective: No events overnight.   Objective: Filed Vitals:   02/12/13 2300 02/13/13 0000 02/13/13 0340 02/13/13 0400  BP: 148/44 148/44 144/54 155/51  Pulse: 61 61 58 52  Temp:  97.2 F (36.2 C)  98 F (36.7 C)  TempSrc:  Oral  Axillary  Resp: 20 22 18 15   Height:      Weight:      SpO2: 99% 99% 99% 100%    Intake/Output Summary (Last 24 hours) at  02/13/13 0753 Last data filed at 02/13/13 0400  Gross per 24 hour  Intake 467.39 ml  Output   3750 ml  Net -3282.61 ml    Exam:   General:  Pt is alert, follows commands appropriately, not in acute distress  Cardiovascular: Regular rate and rhythm, S1/S2, no murmurs, no rubs, no gallops  Respiratory: Clear to auscultation bilaterally, minimal crackles at bases   Abdomen: Soft, non tender, non distended, bowel sounds present, no guarding  Extremities: +1-2 bilateral pitting LE edema, pulses DP and PT palpable bilaterally  Neuro: Grossly  nonfocal  Data Reviewed: Basic Metabolic Panel:  Recent Labs Lab 02/11/13 0305 02/12/13 0340 02/13/13 0345  NA 142 139 139  K 4.9 4.7 4.2  CL 109 105 101  CO2 24 26 30   GLUCOSE 132* 100* 92  BUN 30* 39* 46*  CREATININE 2.30* 2.75* 2.93*  CALCIUM 8.6 8.5 8.5   CBC:  Recent Labs Lab 02/11/13 0305 02/12/13 0340 02/13/13 0345  WBC 9.6 6.9 7.8  NEUTROABS 8.0*  --   --   HGB 7.5* 7.2* 7.5*  HCT 25.2* 24.7* 25.6*  MCV 75.7* 76.5* 76.4*  PLT 285 250 278   Cardiac Enzymes:  Recent Labs Lab 02/11/13 0305 02/11/13 0600 02/11/13 1200 02/11/13 1726  TROPONINI <0.30 <0.30 <0.30 <0.30   CBG:  Recent Labs Lab 02/12/13 0805 02/12/13 1159 02/12/13 1600 02/12/13 2213 02/13/13 0741  GLUCAP 154* 118* 149* 121* 81    Recent Results (from the past 240 hour(s))  MRSA PCR SCREENING     Status: None   Collection Time    02/11/13  7:03 AM      Result Value Range Status   MRSA by PCR NEGATIVE  NEGATIVE Final   Comment:            The GeneXpert MRSA Assay (FDA     approved for NASAL specimens     only), is one component of a     comprehensive MRSA colonization     surveillance program. It is not     intended to diagnose MRSA     infection nor to guide or     monitor treatment for     MRSA infections.     Scheduled Meds: . allopurinol  100 mg Oral Daily  . amiodarone  200 mg Oral Daily  . amLODipine  5 mg Oral Daily  . atorvastatin  20 mg Oral QHS  . ferrous sulfate  325 mg Oral TID WC  . furosemide  40 mg Intravenous BID  . hydrALAZINE  25 mg Oral Q8H  . insulin aspart  0-20 Units Subcutaneous TID WC  . insulin glargine  22 Units Subcutaneous BID  . levofloxacin  750 mg Oral Q48H  . pantoprazole  40 mg Oral Daily  . sodium chloride  3 mL Intravenous Q12H  . spironolactone  25 mg Oral Daily  . Warfarin - Pharmacist Dosing Inpatient   Does not apply q1800   Continuous Infusions: . nitroGLYCERIN Stopped (02/12/13 1505)     Debbora Presto,  MD  [facility] Pager [phone]  If 7PM-7AM, please contact night-coverage [email or web address] Password [facility] 02/13/2013, 7:53 AM   LOS: 2 days

## 2013-02-13 NOTE — Progress Notes (Signed)
CPAP setup for pt to use at night. Pt unaware of home setting so started at 10 cmH2O. This didn't feel like enough so increased to 12 cmH2O & she said this feels right. Oxygen bled in at 2.5L. Pt placed on full face mask because she wears this at home.  Jacqulynn Cadet RRT

## 2013-02-13 NOTE — Progress Notes (Signed)
ANTICOAGULATION CONSULT NOTE - Follow Up Consult  Pharmacy Consult for warfarin Indication: atrial fibrillation  Allergies  Allergen Reactions  . Daptomycin Other (See Comments)    Elevated CPK  . Morphine And Related Nausea And Vomiting  . Cephalexin Rash    unknown  . Celecoxib     unknown  . Codeine     unknown  . Fluoxetine Hcl     unknown  . Latex     REACTION: undefined  . Ofloxacin     unknown  . Penicillins     unknown  . Rofecoxib     unknown  . Sulfonamide Derivatives     unknown    Patient Measurements: Height: 5\' 3"  (160 cm) Weight: 263 lb 3.7 oz (119.4 kg) IBW/kg (Calculated) : 52.4  Vital Signs: Temp: 97.5 F (36.4 C) (03/08 0800) Temp src: Oral (03/08 0800) BP: 163/67 mmHg (03/08 0938) Pulse Rate: 52 (03/08 0400)  Labs:  Recent Labs  02/11/13 0305 02/11/13 0600 02/11/13 1200 02/11/13 1726 02/12/13 0340 02/13/13 0345  HGB 7.5*  --   --   --  7.2* 7.5*  HCT 25.2*  --   --   --  24.7* 25.6*  PLT 285  --   --   --  250 278  LABPROT 19.5*  --   --   --  19.7* 19.3*  INR 1.71*  --   --   --  1.73* 1.69*  CREATININE 2.30*  --   --   --  2.75* 2.93*  TROPONINI <0.30 <0.30 <0.30 <0.30  --   --     Estimated Creatinine Clearance: 24.9 ml/min (by C-G formula based on Cr of 2.93).   Medications:  . allopurinol  100 mg Oral Daily  . amiodarone  200 mg Oral Daily  . amLODipine  5 mg Oral Daily  . atorvastatin  20 mg Oral QHS  . ferrous sulfate  325 mg Oral TID WC  . furosemide  40 mg Intravenous BID  . hydrALAZINE  25 mg Oral Q8H  . insulin aspart  0-20 Units Subcutaneous TID WC  . insulin glargine  22 Units Subcutaneous BID  . levofloxacin  750 mg Oral Q48H  . pantoprazole  40 mg Oral Daily  . sodium chloride  3 mL Intravenous Q12H  . spironolactone  25 mg Oral Daily  . [COMPLETED] warfarin  5 mg Oral ONCE-1800  . Warfarin - Pharmacist Dosing Inpatient   Does not apply q1800  . [DISCONTINUED] furosemide  40 mg Intravenous Daily  .  [DISCONTINUED] furosemide  40 mg Intravenous Daily  . [DISCONTINUED] furosemide  60 mg Intravenous Q8H  . [DISCONTINUED] irbesartan  150 mg Oral Daily  . [DISCONTINUED] levofloxacin  250 mg Oral Daily  . [DISCONTINUED] levofloxacin  500 mg Oral Q48H     Assessment:  61 yof admit 3/6 with SOB and CHF.  Patient is on chronic warfarain for afib.  Home dose was 3mg  daily and INR on admission was subtherapeutic at 1.71.  INR (1.69) remains subtherapeutic and decreased, despite boosted doses.  Hgb is low at 7.5, Plt wnl.  No reported bleeding    Noted potential drug-drug interactions with warfarin:  Amiodarone, levaquin, allopurionol (may prolong INR).  Goal of Therapy:  INR 2-3   Plan:   Warfarin 5mg  PO today  Daily INR, CBC  Lynann Beaver PharmD, BCPS Pager [phone] 02/13/2013 9:57 AM

## 2013-02-14 LAB — PROTIME-INR
INR: 1.76 — ABNORMAL HIGH (ref 0.00–1.49)
Prothrombin Time: 19.9 seconds — ABNORMAL HIGH (ref 11.6–15.2)

## 2013-02-14 LAB — LEGIONELLA ANTIGEN, URINE

## 2013-02-14 LAB — CULTURE, BLOOD (ROUTINE X 2)

## 2013-02-14 LAB — BASIC METABOLIC PANEL
Calcium: 8.5 mg/dL (ref 8.4–10.5)
GFR calc non Af Amer: 20 mL/min — ABNORMAL LOW (ref >90)
Sodium: 138 mEq/L (ref 135–145)

## 2013-02-14 LAB — CBC
HCT: 25.9 % — ABNORMAL LOW (ref 36.0–46.0)
MCHC: 29 g/dL — ABNORMAL LOW (ref 30.0–36.0)
RDW: 19.8 % — ABNORMAL HIGH (ref 11.5–15.5)

## 2013-02-14 LAB — GLUCOSE, CAPILLARY: Glucose-Capillary: 143 mg/dL — ABNORMAL HIGH (ref 70–99)

## 2013-02-14 MED ORDER — VANCOMYCIN HCL 10 G IV SOLR
1500.0000 mg | Freq: Every day | INTRAVENOUS | Status: DC
  Administered 2013-02-14 – 2013-02-15 (×2): 1500 mg via INTRAVENOUS
  Filled 2013-02-14 (×3): qty 1500

## 2013-02-14 MED ORDER — FUROSEMIDE 40 MG PO TABS
60.0000 mg | ORAL_TABLET | Freq: Two times a day (BID) | ORAL | Status: DC
  Administered 2013-02-14 – 2013-02-18 (×8): 60 mg via ORAL
  Filled 2013-02-14 (×10): qty 1

## 2013-02-14 MED ORDER — VANCOMYCIN HCL IN DEXTROSE 1-5 GM/200ML-% IV SOLN
1000.0000 mg | Freq: Two times a day (BID) | INTRAVENOUS | Status: DC
  Filled 2013-02-14: qty 200

## 2013-02-14 MED ORDER — WARFARIN SODIUM 5 MG PO TABS
5.0000 mg | ORAL_TABLET | Freq: Once | ORAL | Status: AC
  Administered 2013-02-14: 5 mg via ORAL
  Filled 2013-02-14: qty 1

## 2013-02-14 NOTE — Progress Notes (Signed)
Pt already wearing cpap at this time. I filled her water chamber with sterile water & fixed her mask because it was leaking pretty bad. Pt remains on cpap 12 cmH2O with 1L O2 bleed in.  Jacqulynn Cadet RRT

## 2013-02-14 NOTE — Progress Notes (Signed)
ANTIBIOTIC CONSULT NOTE - INITIAL  Pharmacy Consult for vancomycin Indication: rule out pneumonia  Allergies  Allergen Reactions  . Daptomycin Other (See Comments)    Elevated CPK  . Morphine And Related Nausea And Vomiting  . Cephalexin Rash    unknown  . Celecoxib     unknown  . Codeine     unknown  . Fluoxetine Hcl     unknown  . Latex     REACTION: undefined  . Ofloxacin     unknown  . Penicillins     unknown  . Rofecoxib     unknown  . Sulfonamide Derivatives     unknown    Patient Measurements: Height: 5\' 3"  (160 cm) Weight: 263 lb 3.7 oz (119.4 kg) IBW/kg (Calculated) : 52.4 Adjusted Body Weight:   Vital Signs: Temp: 97.9 F (36.6 C) (03/08 2107) Temp src: Oral (03/08 2107) BP: 143/47 mmHg (03/08 2107) Pulse Rate: 60 (03/08 2107) Intake/Output from previous day: 03/08 0701 - 03/09 0700 In: 600 [P.O.:600] Out: 1215 [Urine:1215] Intake/Output from this shift: Total I/O In: -  Out: 240 [Urine:240]  Labs:  Recent Labs  02/11/13 0305 02/12/13 0340 02/13/13 0345  WBC 9.6 6.9 7.8  HGB 7.5* 7.2* 7.5*  PLT 285 250 278  CREATININE 2.30* 2.75* 2.93*   Estimated Creatinine Clearance: 24.9 ml/min (by C-G formula based on Cr of 2.93). No results found for this basename: VANCOTROUGH, Leodis Binet, VANCORANDOM, GENTTROUGH, GENTPEAK, GENTRANDOM, TOBRATROUGH, TOBRAPEAK, TOBRARND, AMIKACINPEAK, AMIKACINTROU, AMIKACIN,  in the last 72 hours   Microbiology: Recent Results (from the past 720 hour(s))  MRSA PCR SCREENING     Status: None   Collection Time    02/11/13  7:03 AM      Result Value Range Status   MRSA by PCR NEGATIVE  NEGATIVE Final   Comment:            The GeneXpert MRSA Assay (FDA     approved for NASAL specimens     only), is one component of a     comprehensive MRSA colonization     surveillance program. It is not     intended to diagnose MRSA     infection nor to guide or     monitor treatment for     MRSA infections.  CULTURE, BLOOD  (ROUTINE X 2)     Status: None   Collection Time    02/12/13  1:30 PM      Result Value Range Status   Specimen Description BLOOD RIGHT ARM   Final   Special Requests BOTTLES DRAWN AEROBIC ONLY 4CC   Final   Culture  Setup Time 02/13/2013 01:16   Final   Culture     Final   Value: GRAM POSITIVE COCCI IN CLUSTERS     Note: Gram Stain Report Called to,Read Back By and Verified With: Juanetta Gosling RN on 02/13/13 at 22:15 by Christie Nottingham   Report Status PENDING   Incomplete    Medical History: Past Medical History  Diagnosis Date  . Asthma   . Bronchitis   . Allergic rhinitis   . Diabetes mellitus   . HTN (hypertension)   . DJD (degenerative joint disease)   . Hyperlipidemia   . Complication of anesthesia     trouble waking up  . Obesity   . OSA (obstructive sleep apnea)     poor compliance with cpap  . CHF (congestive heart failure)   . Biventricular failure     compensated  .  Psoriasis   . Stasis edema     of lower extremities  . Depression   . Situational stress   . Stroke   . Kidney stones   . Kidney disease, chronic, stage III (GFR 30-59 ml/min)   . Anemia   . Gout   . Chronic anticoagulation   . Yeast infection involving the vagina and surrounding area   . Atrial fibrillation with RVR     Medications:  Anti-infectives   Start     Dose/Rate Route Frequency Ordered Stop   02/14/13 2200  vancomycin (VANCOCIN) 1,500 mg in sodium chloride 0.9 % 500 mL IVPB     1,500 mg 250 mL/hr over 120 Minutes Intravenous Daily at bedtime 02/14/13 0015     02/14/13 0900  vancomycin (VANCOCIN) IVPB 1000 mg/200 mL premix  Status:  Discontinued     1,000 mg 200 mL/hr over 60 Minutes Intravenous Every 12 hours 02/14/13 0012 02/14/13 0015   02/13/13 2245  vancomycin (VANCOCIN) 1,500 mg in sodium chloride 0.9 % 500 mL IVPB     1,500 mg 250 mL/hr over 120 Minutes Intravenous  Once 02/13/13 2236     02/13/13 1000  levofloxacin (LEVAQUIN) tablet 500 mg  Status:  Discontinued     500 mg  Oral Every 48 hours 02/12/13 1351 02/12/13 1355   02/13/13 1000  levofloxacin (LEVAQUIN) tablet 750 mg     750 mg Oral Every 48 hours 02/12/13 1355     02/11/13 1000  levofloxacin (LEVAQUIN) tablet 250 mg  Status:  Discontinued     250 mg Oral Daily 02/11/13 0539 02/12/13 1351     Assessment: Patient with PNA.  First dose of antibiotics already given.  Goal of Therapy:  Vancomycin trough level 15-20 mcg/ml  Plan:  Measure antibiotic drug levels at steady state Follow up culture results Vancomycin 1500mg  iv q24hr  Aleene Davidson Crowford 02/14/2013,12:16 AM

## 2013-02-14 NOTE — Progress Notes (Signed)
Patient ID: [name], female   DOB: 23-May-1951, 62 y.o.   MRN: [id number]  [facility] PROGRESS NOTE  [name]:[id number] DOB: Dec 13, 1950 DOA: 02/11/2013 PCP: [name] [name], MD  Brief narrative:  Pt is 62 y.o. female who presented to the ED from home via EMS with main concern of progressively worsening SOB that initially started 3-4 days prior to admission and has been associated with non productive cough. Pt denies any specific alleviating factors including the use of Albuterol inhaler. Pt explained she used extra dose of Lasix but that did not help either.She noticed that dyspnea was worse with exertion. Pt denied any specific chest pain, fevers, chills. She also explains that LE swelling is at her baseline.   In the ED lab workup, clinical exam, and CXR consistent with exacerbation of CHF, Hospitalist has been asked to admit for CHF exacerbation.   Principal Problem:  Acute combined systolic and diastolic heart failure  - appears to be clinically improving and maintaining oxygen saturations at target range  - continue with current dose of Lasix, continue to monitor daily weights, and I's and O's  - weight trend 272 lbs (03/06) --> 263 lbs (03/07), BNP trending down 1700 --> 690, follow up on weight today   - 2D ECHO 3/7 indicative of worsening systolic and diastolic function  - close monitoring of renal function in the setting of diuresis  Active Problems:  Acute on chronic renal failure  - pt has chronic renal failure and review of records indicated baseline Cr 2.3 - 2.5  - pt has had higher creatinine levels in 3 - 3.5  - creatinine is trending down, BMP in AM Acute hypoxic respiratory failure  - likely multifactorial and secondary to CHF, ? PNA, Asthma  - one set of blood culture positive for staph coag, please see below --> vancomycin started at night time - will repeat blood cultures 02/14/2013 - this appears to be clinically stable otherwise - no wheezing on  exam this AM, continue nebulizer as needed  - Levaquin day #4 - sputum culture and analysis pending  OBSTRUCTIVE SLEEP APNEA, OHS  - CPAP at night time  HYPERTENSION, malignant  - much better controlled this AM  - off nitro drip  - discontinued ARB due to renal failure  - continue Hydralazine scheduled and PRN  ATRIAL FIBRILLATION WITH RAPID VENTRICULAR RESPONSE  - rate controlled, continue Amiodarone and Coumadin  DM (diabetes mellitus), uncontrolled with complications of neuropathy  - continue Lantus and SSI  Chronic anticoagulation  - coumadin per pharmacy  Anemia of chronic disease  - stable Hg, pt reluctant on transfusion  - plan on transfusing if Hg < 7  - CBC in AM   Consultants:  None Procedures/Studies:  Dg Chest 2 View 02/11/2013 Lungs hypoexpanded. Vascular congestion, cardiomegaly, mild pulmonary edema, ? pneumonia.  Antibiotics:  Levaquin 03/06 -->  Vancomycin 03/08 -->  Code Status: Full  Family Communication: Pt at bedside and no other family members present   HPI/Subjective: No events overnight.   Objective: Filed Vitals:   02/13/13 1200 02/13/13 1500 02/13/13 2107 02/14/13 0616  BP: 153/55 159/45 143/47 155/57  Pulse: 59 56 60 51  Temp: 97.5 F (36.4 C) 98 F (36.7 C) 97.9 F (36.6 C) 97.5 F (36.4 C)  TempSrc: Oral Oral Oral Oral  Resp: 16 16 20 16   Height:      Weight:      SpO2: 100% 100% 100% 99%    Intake/Output Summary (  Last 24 hours) at 02/14/13 1125 Last data filed at 02/14/13 0735  Gross per 24 hour  Intake    480 ml  Output   1390 ml  Net   -910 ml    Exam:   General:  Pt is alert, follows commands appropriately, not in acute distress  Cardiovascular: Regular rate and rhythm, S1/S2, no murmurs, no rubs, no gallops  Respiratory: Clear to auscultation bilaterally, no wheezing, decreased breath sounds at bases   Abdomen: Soft, non tender, non distended, bowel sounds present, no guarding  Extremities: No edema, pulses DP and  PT palpable bilaterally  Neuro: Grossly nonfocal  Data Reviewed: Basic Metabolic Panel:  Recent Labs Lab 02/11/13 0305 02/12/13 0340 02/13/13 0345 02/14/13 0512  NA 142 139 139 138  K 4.9 4.7 4.2 4.3  CL 109 105 101 102  CO2 24 26 30 27   GLUCOSE 132* 100* 92 98  BUN 30* 39* 46* 50*  CREATININE 2.30* 2.75* 2.93* 2.42*  CALCIUM 8.6 8.5 8.5 8.5   CBC:  Recent Labs Lab 02/11/13 0305 02/12/13 0340 02/13/13 0345 02/14/13 0512  WBC 9.6 6.9 7.8 6.9  NEUTROABS 8.0*  --   --   --   HGB 7.5* 7.2* 7.5* 7.5*  HCT 25.2* 24.7* 25.6* 25.9*  MCV 75.7* 76.5* 76.4* 77.5*  PLT 285 250 278 278   Cardiac Enzymes:  Recent Labs Lab 02/11/13 0305 02/11/13 0600 02/11/13 1200 02/11/13 1726  TROPONINI <0.30 <0.30 <0.30 <0.30   CBG:  Recent Labs Lab 02/12/13 2213 02/13/13 0741 02/13/13 1134 02/13/13 1635 02/13/13 2104  GLUCAP 121* 81 100* 106* 109*    Recent Results (from the past 240 hour(s))  MRSA PCR SCREENING     Status: None   Collection Time    02/11/13  7:03 AM      Result Value Range Status   MRSA by PCR NEGATIVE  NEGATIVE Final   Comment:            The GeneXpert MRSA Assay (FDA     approved for NASAL specimens     only), is one component of a     comprehensive MRSA colonization     surveillance program. It is not     intended to diagnose MRSA     infection nor to guide or     monitor treatment for     MRSA infections.  CULTURE, BLOOD (ROUTINE X 2)     Status: None   Collection Time    02/12/13  1:30 PM      Result Value Range Status   Specimen Description BLOOD RIGHT ARM   Final   Special Requests BOTTLES DRAWN AEROBIC ONLY 4CC   Final   Culture  Setup Time 02/13/2013 01:16   Final   Culture     Final   Value: STAPHYLOCOCCUS SPECIES (COAGULASE NEGATIVE)     Note: THE SIGNIFICANCE OF ISOLATING THIS ORGANISM FROM A SINGLE SET OF BLOOD CULTURES WHEN MULTIPLE SETS ARE DRAWN IS UNCERTAIN. PLEASE NOTIFY THE MICROBIOLOGY DEPARTMENT WITHIN ONE WEEK IF  SPECIATION AND SENSITIVITIES ARE REQUIRED.     Note: Gram Stain Report Called to,Read Back By and Verified With: Juanetta Gosling RN on 02/13/13 at 22:15 by Christie Nottingham   Report Status 02/14/2013 FINAL   Final     Scheduled Meds: . allopurinol  100 mg Oral Daily  . amiodarone  200 mg Oral Daily  . amLODipine  5 mg Oral Daily  . atorvastatin  20 mg Oral QHS  .  ferrous sulfate  325 mg Oral TID WC  . furosemide  40 mg Oral BID  . hydrALAZINE  50 mg Oral Q8H  . insulin aspart  0-20 Units Subcutaneous TID WC  . insulin glargine  22 Units Subcutaneous BID  . levofloxacin  750 mg Oral Q48H  . pantoprazole  40 mg Oral Daily  . sodium chloride  3 mL Intravenous Q12H  . spironolactone  25 mg Oral Daily  . vancomycin  1,500 mg Intravenous QHS  . warfarin  5 mg Oral ONCE-1800  . Warfarin - Pharmacist Dosing Inpatient   Does not apply q1800   Continuous Infusions: . nitroGLYCERIN Stopped (02/12/13 1505)     Debbora Presto, MD  [facility] Pager [phone]  If 7PM-7AM, please contact night-coverage [email or web address] Password [email or web address] 02/14/2013, 11:25 AM   LOS: 3 days

## 2013-02-14 NOTE — Progress Notes (Signed)
ANTICOAGULATION CONSULT NOTE - Follow Up Consult  Pharmacy Consult for warfarin Indication: atrial fibrillation  Allergies  Allergen Reactions  . Daptomycin Other (See Comments)    Elevated CPK  . Morphine And Related Nausea And Vomiting  . Cephalexin Rash    unknown  . Celecoxib     unknown  . Codeine     unknown  . Fluoxetine Hcl     unknown  . Latex     REACTION: undefined  . Ofloxacin     unknown  . Penicillins     unknown  . Rofecoxib     unknown  . Sulfonamide Derivatives     unknown    Patient Measurements: Height: 5\' 3"  (160 cm) Weight: 263 lb 3.7 oz (119.4 kg) IBW/kg (Calculated) : 52.4  Vital Signs: Temp: 97.5 F (36.4 C) (03/09 0616) Temp src: Oral (03/09 0616) BP: 155/57 mmHg (03/09 0616) Pulse Rate: 51 (03/09 0616)  Labs:  Recent Labs  02/11/13 1200 02/11/13 1726  02/12/13 0340 02/13/13 0345 02/14/13 0512  HGB  --   --   < > 7.2* 7.5* 7.5*  HCT  --   --   --  24.7* 25.6* 25.9*  PLT  --   --   --  250 278 278  LABPROT  --   --   --  19.7* 19.3* 19.9*  INR  --   --   --  1.73* 1.69* 1.76*  CREATININE  --   --   --  2.75* 2.93* 2.42*  TROPONINI <0.30 <0.30  --   --   --   --   < > = values in this interval not displayed.  Estimated Creatinine Clearance: 30.1 ml/min (by C-G formula based on Cr of 2.42).   Medications:  . allopurinol  100 mg Oral Daily  . amiodarone  200 mg Oral Daily  . amLODipine  5 mg Oral Daily  . atorvastatin  20 mg Oral QHS  . ferrous sulfate  325 mg Oral TID WC  . furosemide  40 mg Oral BID  . hydrALAZINE  50 mg Oral Q8H  . insulin aspart  0-20 Units Subcutaneous TID WC  . insulin glargine  22 Units Subcutaneous BID  . levofloxacin  750 mg Oral Q48H  . pantoprazole  40 mg Oral Daily  . sodium chloride  3 mL Intravenous Q12H  . spironolactone  25 mg Oral Daily  . [COMPLETED] vancomycin  1,500 mg Intravenous Once  . vancomycin  1,500 mg Intravenous QHS  . [COMPLETED] warfarin  5 mg Oral ONCE-1800  . Warfarin  - Pharmacist Dosing Inpatient   Does not apply q1800  . [DISCONTINUED] furosemide  40 mg Intravenous BID  . [DISCONTINUED] hydrALAZINE  25 mg Oral Q8H  . [DISCONTINUED] vancomycin  1,000 mg Intravenous Q12H     Assessment:  61 yof admit 3/6 with SOB and CHF.  Patient is on chronic warfarain for afib.  Home dose was 3mg  daily and INR on admission was subtherapeutic at 1.71.  INR (1.76) remains subtherapeutic, but increased.  Pt has had boosted doses x3 days.    Hgb is stable/low at 7.5, Plt wnl.  No reported bleeding    SCr is improved today.  Noted potential drug-drug interactions with warfarin:  Amiodarone, levaquin, allopurionol (may prolong INR).  All are ongoing PTA medications.  Goal of Therapy:  INR 2-3   Plan:   Warfarin 5mg  PO today  Daily INR, CBC  Lynann Beaver PharmD, BCPS Pager [phone]  02/14/2013 9:07 AM

## 2013-02-15 LAB — BASIC METABOLIC PANEL
BUN: 51 mg/dL — ABNORMAL HIGH (ref 6–23)
Calcium: 8.5 mg/dL (ref 8.4–10.5)
Creatinine, Ser: 2.46 mg/dL — ABNORMAL HIGH (ref 0.50–1.10)
GFR calc non Af Amer: 20 mL/min — ABNORMAL LOW (ref >90)
Glucose, Bld: 100 mg/dL — ABNORMAL HIGH (ref 70–99)

## 2013-02-15 LAB — VITAMIN B12: Vitamin B-12: 223 pg/mL (ref 211–911)

## 2013-02-15 LAB — CBC
HCT: 26.6 % — ABNORMAL LOW (ref 36.0–46.0)
Hemoglobin: 7.5 g/dL — ABNORMAL LOW (ref 12.0–15.0)
MCH: 21.9 pg — ABNORMAL LOW (ref 26.0–34.0)
MCHC: 28.2 g/dL — ABNORMAL LOW (ref 30.0–36.0)
MCV: 77.8 fL — ABNORMAL LOW (ref 78.0–100.0)

## 2013-02-15 LAB — GLUCOSE, CAPILLARY
Glucose-Capillary: 86 mg/dL (ref 70–99)
Glucose-Capillary: 95 mg/dL (ref 70–99)
Glucose-Capillary: 151 mg/dL — ABNORMAL HIGH (ref 70–99)

## 2013-02-15 MED ORDER — WARFARIN SODIUM 5 MG PO TABS
5.0000 mg | ORAL_TABLET | Freq: Once | ORAL | Status: AC
  Administered 2013-02-15: 5 mg via ORAL
  Filled 2013-02-15: qty 1

## 2013-02-15 NOTE — Progress Notes (Signed)
Patient ID: [name], female   DOB: [birth year], 62 y.o.   MRN: [id number]  [facility] PROGRESS NOTE  [name] BJY:[id number] DOB: [birth year] DOA: 02/11/2013 PCP: [name] [name], MD  Brief narrative:  Pt is 62 y.o. female who presented to the ED from home via EMS with main concern of progressively worsening SOB that initially started 3-4 days prior to admission and has been associated with non productive cough. Pt denies any specific alleviating factors including the use of Albuterol inhaler. Pt explained she used extra dose of Lasix but that did not help either.She noticed that dyspnea was worse with exertion. Pt denied any specific chest pain, fevers, chills. She also explains that LE swelling is at her baseline.   In the ED lab workup, clinical exam, and CXR consistent with exacerbation of CHF, Hospitalist has been asked to admit for CHF exacerbation.   Principal Problem:  Acute combined systolic and diastolic heart failure  - appears to be clinically improving and maintaining oxygen saturations at target range  - continue with current dose of Lasix, continue to monitor daily weights, and I's and O's  - weight trend 272 lbs (03/06) --> 263 lbs (03/07) --> 266 lbs --> 263 lbs, BNP trending down 1700 --> 690 --> 438 - 2D ECHO 3/7 indicative of worsening systolic and diastolic function  - close monitoring of renal function in the setting of diuresis  Active Problems:  Acute on chronic renal failure  - pt has chronic renal failure and review of records indicated baseline Cr 2.3 - 2.5  - pt has had higher creatinine levels in 3 - 3.5  - creatinine is now stabilizing at the pt's baseline  Acute hypoxic respiratory failure  - likely multifactorial and secondary to CHF, ? PNA, Asthma  - one set of blood culture positive for staph coag, please see below --> vancomycin started 02/14/2013 - repeat blood cultures 02/14/2013  - this appears to be clinically stable otherwise  - no  wheezing on exam this AM, continue nebulizer as needed  - Levaquin day #5/7 - sputum culture and analysis no growth to date  OBSTRUCTIVE SLEEP APNEA, OHS  - CPAP at night time  HYPERTENSION, malignant  - much better controlled this AM  - off nitro drip  - discontinued ARB due to renal failure  - continue Hydralazine scheduled and PRN  ATRIAL FIBRILLATION WITH RAPID VENTRICULAR RESPONSE  - rate controlled, continue Amiodarone and Coumadin  DM (diabetes mellitus), uncontrolled with complications of neuropathy  - continue Lantus and SSI  Chronic anticoagulation  - coumadin per pharmacy  Anemia of chronic disease  - stable Hg, pt reluctant on transfusion  - plan on transfusing if Hg < 7   Consultants:  None Procedures/Studies:  Dg Chest 2 View 02/11/2013 Lungs hypoexpanded. Vascular congestion, cardiomegaly, mild pulmonary edema, ? pneumonia.  Antibiotics:  Levaquin 03/06 -->  Vancomycin 03/08 -->  Code Status: Full  Family Communication: Pt at bedside and no other family members present   HPI/Subjective: No events overnight.   Objective: Filed Vitals:   02/14/13 1649 02/14/13 2047 02/15/13 0500 02/15/13 0523  BP:  157/54  158/56  Pulse:  64  57  Temp:  98.2 F (36.8 C)  98 F (36.7 C)  TempSrc:  Oral  Oral  Resp:  18  16  Height:      Weight: 266 lb 1.5 oz (120.7 kg)  263 lb 3.7 oz (119.4 kg)   SpO2:  100%  99%  Intake/Output Summary (Last 24 hours) at 02/15/13 1035 Last data filed at 02/15/13 0700  Gross per 24 hour  Intake    800 ml  Output   2300 ml  Net  -1500 ml    Exam:   General:  Pt is alert, follows commands appropriately, not in acute distress  Cardiovascular: Regular rate and rhythm, S1/S2, no murmurs, no rubs, no gallops  Respiratory: Clear to auscultation bilaterally, no wheezing, no crackles, no rhonchi, decreased breath sounds at bases  Abdomen: Soft, non tender, non distended, bowel sounds present, no guarding  Extremities: +1 bilateral  pitting LE edema, pulses DP and PT palpable bilaterally  Neuro: Grossly nonfocal  Data Reviewed: Basic Metabolic Panel:  Recent Labs Lab 02/11/13 0305 02/12/13 0340 02/13/13 0345 02/14/13 0512 02/15/13 0434  NA 142 139 139 138 140  K 4.9 4.7 4.2 4.3 4.6  CL 109 105 101 102 103  CO2 24 26 30 27 28   GLUCOSE 132* 100* 92 98 100*  BUN 30* 39* 46* 50* 51*  CREATININE 2.30* 2.75* 2.93* 2.42* 2.46*  CALCIUM 8.6 8.5 8.5 8.5 8.5  CBC:  Recent Labs Lab 02/11/13 0305 02/12/13 0340 02/13/13 0345 02/14/13 0512 02/15/13 0434  WBC 9.6 6.9 7.8 6.9 7.5  NEUTROABS 8.0*  --   --   --   --   HGB 7.5* 7.2* 7.5* 7.5* 7.5*  HCT 25.2* 24.7* 25.6* 25.9* 26.6*  MCV 75.7* 76.5* 76.4* 77.5* 77.8*  PLT 285 250 278 278 299   Cardiac Enzymes:  Recent Labs Lab 02/11/13 0305 02/11/13 0600 02/11/13 1200 02/11/13 1726  TROPONINI <0.30 <0.30 <0.30 <0.30   CBG:  Recent Labs Lab 02/14/13 0748 02/14/13 1151 02/14/13 1646 02/14/13 2046 02/15/13 0744  GLUCAP 89 131* 96 143* 86    Recent Results (from the past 240 hour(s))  MRSA PCR SCREENING     Status: None   Collection Time    02/11/13  7:03 AM      Result Value Range Status   MRSA by PCR NEGATIVE  NEGATIVE Final   Comment:            The GeneXpert MRSA Assay (FDA     approved for NASAL specimens     only), is one component of a     comprehensive MRSA colonization     surveillance program. It is not     intended to diagnose MRSA     infection nor to guide or     monitor treatment for     MRSA infections.  CULTURE, BLOOD (ROUTINE X 2)     Status: None   Collection Time    02/12/13  1:30 PM      Result Value Range Status   Specimen Description BLOOD RIGHT ARM   Final   Special Requests BOTTLES DRAWN AEROBIC ONLY 4CC   Final   Culture  Setup Time 02/13/2013 01:16   Final   Culture     Final   Value: STAPHYLOCOCCUS SPECIES (COAGULASE NEGATIVE)     Note: THE SIGNIFICANCE OF ISOLATING THIS ORGANISM FROM A SINGLE SET OF BLOOD  CULTURES WHEN MULTIPLE SETS ARE DRAWN IS UNCERTAIN. PLEASE NOTIFY THE MICROBIOLOGY DEPARTMENT WITHIN ONE WEEK IF SPECIATION AND SENSITIVITIES ARE REQUIRED.     Note: Gram Stain Report Called to,Read Back By and Verified With: Juanetta Gosling RN on 02/13/13 at 22:15 by Christie Nottingham   Report Status 02/14/2013 FINAL   Final  CULTURE, BLOOD (ROUTINE X 2)     Status:  None   Collection Time    02/12/13  1:37 PM      Result Value Range Status   Specimen Description BLOOD LEFT ARM   Final   Special Requests BOTTLES DRAWN AEROBIC AND ANAEROBIC 5CC   Final   Culture  Setup Time 02/13/2013 01:16   Final   Culture     Final   Value:        BLOOD CULTURE RECEIVED NO GROWTH TO DATE CULTURE WILL BE HELD FOR 5 DAYS BEFORE ISSUING A FINAL NEGATIVE REPORT   Report Status PENDING   Incomplete  CULTURE, BLOOD (ROUTINE X 2)     Status: None   Collection Time    02/14/13 12:45 PM      Result Value Range Status   Specimen Description BLOOD LEFT ARM   Final   Special Requests BOTTLES DRAWN AEROBIC AND ANAEROBIC 5 CC EA   Final   Culture  Setup Time 02/14/2013 17:00   Final   Culture     Final   Value:        BLOOD CULTURE RECEIVED NO GROWTH TO DATE CULTURE WILL BE HELD FOR 5 DAYS BEFORE ISSUING A FINAL NEGATIVE REPORT   Report Status PENDING   Incomplete  CULTURE, BLOOD (ROUTINE X 2)     Status: None   Collection Time    02/14/13  1:00 PM      Result Value Range Status   Specimen Description BLOOD RIGHT HAND   Final   Special Requests BOTTLES DRAWN AEROBIC AND ANAEROBIC 5 CC EA   Final   Culture  Setup Time 02/14/2013 17:00   Final   Culture     Final   Value:        BLOOD CULTURE RECEIVED NO GROWTH TO DATE CULTURE WILL BE HELD FOR 5 DAYS BEFORE ISSUING A FINAL NEGATIVE REPORT   Report Status PENDING   Incomplete     Scheduled Meds: . allopurinol  100 mg Oral Daily  . amiodarone  200 mg Oral Daily  . amLODipine  5 mg Oral Daily  . atorvastatin  20 mg Oral QHS  . ferrous sulfate  325 mg Oral TID WC  .  furosemide  60 mg Oral BID  . hydrALAZINE  50 mg Oral Q8H  . insulin aspart  0-20 Units Subcutaneous TID WC  . insulin glargine  22 Units Subcutaneous BID  . levofloxacin  750 mg Oral Q48H  . pantoprazole  40 mg Oral Daily  . sodium chloride  3 mL Intravenous Q12H  . spironolactone  25 mg Oral Daily  . vancomycin  1,500 mg Intravenous QHS  . Warfarin - Pharmacist Dosing Inpatient   Does not apply q1800   Continuous Infusions: . nitroGLYCERIN Stopped (02/12/13 1505)     Debbora Presto, MD  [facility] Pager [phone]  If 7PM-7AM, please contact night-coverage [email or web address] Password [email or web address] 02/15/2013, 10:35 AM   LOS: 4 days

## 2013-02-15 NOTE — Progress Notes (Signed)
Pt placed on cpap 12 cmH2O with 2L O2 bleed in at this time. Water chamber filled to line with sterile water.  Jacqulynn Cadet RRT

## 2013-02-15 NOTE — Progress Notes (Signed)
ANTICOAGULATION CONSULT NOTE - Follow Up  Pharmacy Consult for Warfarin Indication: atrial fibrillation  Allergies  Allergen Reactions  . Daptomycin Other (See Comments)    Elevated CPK  . Morphine And Related Nausea And Vomiting  . Cephalexin Rash    unknown  . Celecoxib     unknown  . Codeine     unknown  . Fluoxetine Hcl     unknown  . Latex     REACTION: undefined  . Ofloxacin     unknown  . Penicillins     unknown  . Rofecoxib     unknown  . Sulfonamide Derivatives     unknown   Patient Measurements: Height: 5\' 3"  (160 cm) Weight: 263 lb 3.7 oz (119.4 kg) IBW/kg (Calculated) : 52.4  Vital Signs: Temp: 98 F (36.7 C) (03/10 0523) Temp src: Oral (03/10 0523) BP: 158/56 mmHg (03/10 0523) Pulse Rate: 57 (03/10 0523)  Labs:  Recent Labs  02/13/13 0345 02/14/13 0512 02/15/13 0434  HGB 7.5* 7.5* 7.5*  HCT 25.6* 25.9* 26.6*  PLT 278 278 299  LABPROT 19.3* 19.9* 21.0*  INR 1.69* 1.76* 1.89*  CREATININE 2.93* 2.42* 2.46*    Estimated Creatinine Clearance: 29.6 ml/min (by C-G formula based on Cr of 2.46).   Medications:  . allopurinol  100 mg Oral Daily  . amiodarone  200 mg Oral Daily  . amLODipine  5 mg Oral Daily  . atorvastatin  20 mg Oral QHS  . ferrous sulfate  325 mg Oral TID WC  . furosemide  60 mg Oral BID  . hydrALAZINE  50 mg Oral Q8H  . insulin aspart  0-20 Units Subcutaneous TID WC  . insulin glargine  22 Units Subcutaneous BID  . levofloxacin  750 mg Oral Q48H  . pantoprazole  40 mg Oral Daily  . sodium chloride  3 mL Intravenous Q12H  . spironolactone  25 mg Oral Daily  . vancomycin  1,500 mg Intravenous QHS  . [COMPLETED] warfarin  5 mg Oral ONCE-1800  . Warfarin - Pharmacist Dosing Inpatient   Does not apply q1800  . [DISCONTINUED] furosemide  40 mg Oral BID     Assessment:  61 yof admit 3/6 with SOB and CHF.  Patient is on chronic warfarain for afib.  Home dose was 3mg  daily and INR on admission was subtherapeutic at  1.71.  INR (1.89) remains subtherapeutic, but increasing.  Pt has had boosted doses x4 days.    Hgb is stable/low at 7.5, Plt wnl.  No reported bleeding    Noted potential drug-drug interactions with warfarin:  Amiodarone, levaquin, allopurionol (may prolong INR).  All are ongoing prior to admission medications.  Goal of Therapy:  INR 2-3   Plan:   Warfarin 5mg  PO today  Daily INR  Darrol Angel, PharmD Pager: [phone] 02/15/2013 11:08 AM

## 2013-02-16 LAB — BASIC METABOLIC PANEL
Calcium: 8.5 mg/dL (ref 8.4–10.5)
GFR calc non Af Amer: 20 mL/min — ABNORMAL LOW (ref >90)
Glucose, Bld: 94 mg/dL (ref 70–99)
Sodium: 143 mEq/L (ref 135–145)

## 2013-02-16 LAB — GLUCOSE, CAPILLARY
Glucose-Capillary: 101 mg/dL — ABNORMAL HIGH (ref 70–99)
Glucose-Capillary: 151 mg/dL — ABNORMAL HIGH (ref 70–99)

## 2013-02-16 MED ORDER — WARFARIN SODIUM 7.5 MG PO TABS
7.5000 mg | ORAL_TABLET | Freq: Once | ORAL | Status: AC
  Administered 2013-02-16: 7.5 mg via ORAL
  Filled 2013-02-16: qty 1

## 2013-02-16 NOTE — Progress Notes (Signed)
ANTICOAGULATION CONSULT NOTE - Follow Up  Pharmacy Consult for Warfarin Indication: atrial fibrillation  Allergies  Allergen Reactions  . Daptomycin Other (See Comments)    Elevated CPK  . Morphine And Related Nausea And Vomiting  . Cephalexin Rash    unknown  . Celecoxib     unknown  . Codeine     unknown  . Fluoxetine Hcl     unknown  . Latex     REACTION: undefined  . Ofloxacin     unknown  . Penicillins     unknown  . Rofecoxib     unknown  . Sulfonamide Derivatives     unknown   Patient Measurements: Height: 5\' 3"  (160 cm) Weight: 265 lb 14 oz (120.6 kg) IBW/kg (Calculated) : 52.4  Vital Signs: Temp: 98 F (36.7 C) (03/11 0557) Temp src: Oral (03/11 0557) BP: 144/52 mmHg (03/11 0557) Pulse Rate: 59 (03/11 0557)  Labs:  Recent Labs  02/14/13 0512 02/15/13 0434 02/16/13 0430  HGB 7.5* 7.5*  --   HCT 25.9* 26.6*  --   PLT 278 299  --   LABPROT 19.9* 21.0* 21.0*  INR 1.76* 1.89* 1.89*  CREATININE 2.42* 2.46* 2.47*    Estimated Creatinine Clearance: 29.7 ml/min (by C-G formula based on Cr of 2.47).   Medications:  . allopurinol  100 mg Oral Daily  . amiodarone  200 mg Oral Daily  . amLODipine  5 mg Oral Daily  . atorvastatin  20 mg Oral QHS  . ferrous sulfate  325 mg Oral TID WC  . furosemide  60 mg Oral BID  . hydrALAZINE  50 mg Oral Q8H  . insulin aspart  0-20 Units Subcutaneous TID WC  . insulin glargine  22 Units Subcutaneous BID  . levofloxacin  750 mg Oral Q48H  . pantoprazole  40 mg Oral Daily  . sodium chloride  3 mL Intravenous Q12H  . spironolactone  25 mg Oral Daily  . vancomycin  1,500 mg Intravenous QHS  . [COMPLETED] warfarin  5 mg Oral ONCE-1800  . Warfarin - Pharmacist Dosing Inpatient   Does not apply q1800     Assessment:  61 yof admit 3/6 with SOB and CHF.  Patient is on chronic warfarain for afib.  Home dose was 3mg  daily and INR on admission was subtherapeutic at 1.71.  INR (1.89) remains subtherapeutic, same as  yesterday. Patient has received warfarin 5mg  x 5 days now. Will give slightly larger dose tonight.  Hgb is stable/low at 7.5, Plt wnl (3/10).  No reported bleeding    Noted potential drug-drug interactions with warfarin:  Amiodarone, levaquin, allopurionol (may prolong INR).  All are ongoing prior to admission medications.  Goal of Therapy:  INR 2-3   Plan:   Warfarin 7.5mg  PO today  Daily INR  Darrol Angel, PharmD Pager: [phone] 02/16/2013 8:45 AM

## 2013-02-16 NOTE — Progress Notes (Signed)
ANTIBIOTIC CONSULT NOTE - FOLLOW UP  Pharmacy Consult for Vanco Indication: PNA  Allergies  Allergen Reactions  . Daptomycin Other (See Comments)    Elevated CPK  . Morphine And Related Nausea And Vomiting  . Cephalexin Rash    unknown  . Celecoxib     unknown  . Codeine     unknown  . Fluoxetine Hcl     unknown  . Latex     REACTION: undefined  . Ofloxacin     unknown  . Penicillins     unknown  . Rofecoxib     unknown  . Sulfonamide Derivatives     unknown    Patient Measurements: Height: 5\' 3"  (160 cm) Weight: 265 lb 14 oz (120.6 kg) IBW/kg (Calculated) : 52.4  Vital Signs: Temp: 98 F (36.7 C) (03/11 0557) Temp src: Oral (03/11 0557) BP: 144/52 mmHg (03/11 0557) Pulse Rate: 59 (03/11 0557) Intake/Output from previous day: 03/10 0701 - 03/11 0700 In: 980 [P.O.:480; IV Piggyback:500] Out: 3100 [Urine:3100]  Labs:  Recent Labs  02/14/13 0512 02/15/13 0434 02/16/13 0430  WBC 6.9 7.5  --   HGB 7.5* 7.5*  --   PLT 278 299  --   CREATININE 2.42* 2.46* 2.47*   Estimated Creatinine Clearance: 29.7 ml/min (by C-G formula based on Cr of 2.47). No results found for this basename: Rolm Gala, [place], GENTTROUGH, [place], GENTRANDOM, TOBRATROUGH, TOBRAPEAK, TOBRARND, AMIKACINPEAK, AMIKACINTROU, AMIKACIN,  in the last 72 hours   Microbiology: 3/7 MRSA: negative 3/7 Blood x2: One with coag neg staph 3/9 Blood x2 : NGTD   Anti-infectives   Start     Dose/Rate Route Frequency Ordered Stop   02/14/13 2200  vancomycin (VANCOCIN) 1,500 mg in sodium chloride 0.9 % 500 mL IVPB     1,500 mg 250 mL/hr over 120 Minutes Intravenous Daily at bedtime 02/14/13 0015     02/14/13 0900  vancomycin (VANCOCIN) IVPB 1000 mg/200 mL premix  Status:  Discontinued     1,000 mg 200 mL/hr over 60 Minutes Intravenous Every 12 hours 02/14/13 0012 02/14/13 0015   02/13/13 2245  vancomycin (VANCOCIN) 1,500 mg in sodium chloride 0.9 % 500 mL IVPB     1,500 mg 250  mL/hr over 120 Minutes Intravenous  Once 02/13/13 2236 02/14/13 0138   02/13/13 1000  levofloxacin (LEVAQUIN) tablet 500 mg  Status:  Discontinued     500 mg Oral Every 48 hours 02/12/13 1351 02/12/13 1355   02/13/13 1000  levofloxacin (LEVAQUIN) tablet 750 mg     750 mg Oral Every 48 hours 02/12/13 1355     02/11/13 1000  levofloxacin (LEVAQUIN) tablet 250 mg  Status:  Discontinued     250 mg Oral Daily 02/11/13 0539 02/12/13 1351      Assessment: 62 yo F on Day #4 Vanco 1500mg  IV q24h for PNA. Levaquin dose was increased on 3/7 from home dose of 250mg  daily to 750mg  q48h for r/o PNA. Today is also Day#4 of PNA-dosed Levaquin. Will check Vanco trough tonight prior to 4th dose, now that SCr appears to have stabilized (acute on chronic renal failure, SCr baseline thought to be 2.3-2.5.)  Goal of Therapy:  Vancomycin trough level 15-20 mcg/ml  Plan:  1) Vanco trough at 21:00 tonight - Pharmacy to f/u  Darrol Angel, PharmD Pager: [phone] 02/16/2013,8:54 AM

## 2013-02-16 NOTE — Progress Notes (Signed)
Patient ID: [name], female   DOB: 24-Nov-1951, 62 y.o.   MRN: [id number]  [facility] PROGRESS NOTE  VEDANSHI MASSARO [name]:[id number] DOB: 04/20/1951 DOA: 02/11/2013 PCP: [name] [name], MD  Brief narrative:  Pt is 62 y.o. female who presented to the ED from home via EMS with main concern of progressively worsening SOB that initially started 3-4 days prior to admission and has been associated with non productive cough. Pt denies any specific alleviating factors including the use of Albuterol inhaler. Pt explained she used extra dose of Lasix but that did not help either.She noticed that dyspnea was worse with exertion. Pt denied any specific chest pain, fevers, chills. She also explains that LE swelling is at her baseline.   In the ED lab workup, clinical exam, and CXR consistent with exacerbation of CHF, Hospitalist has been asked to admit for CHF exacerbation.   Principal Problem:  Acute combined systolic and diastolic heart failure  - appears to be clinically improving and maintaining oxygen saturations at target range  - continue with current dose of Lasix, continue to monitor daily weights, and I's and O's  - please note that dose of Lasix was increased from 40 mg BID to 60 mg BID - weight trend 272 lbs (03/06) --> 263 lbs (03/07) --> 266 lbs --> 263 lbs, BNP trending down 1700 --> 690 --> 438  - 2D ECHO 3/7 indicative of worsening systolic and diastolic function  - creatinine appears to be stabilizing around 2.4 - 2.5 Active Problems:  Acute on chronic renal failure  - pt has chronic renal failure and review of records indicated baseline Cr 2.3 - 2.5  - pt has had higher creatinine levels in 3 - 3.5  - creatinine is now stabilizing at the pt's baseline ~2.4 - 2.5 Acute hypoxic respiratory failure  - likely multifactorial and secondary to CHF, ? PNA, Asthma  - one set of blood culture positive for staph coag, likely contaminant, repeat cultures 3/9 negative, d/c vancomycin  -  no wheezing on exam this AM, continue nebulizer as needed  - Levaquin day #6/7, can be discontinued after tomorrow's dose so no need for ABX upon discharge  - sputum culture and analysis no growth to date  OBSTRUCTIVE SLEEP APNEA, OHS  - CPAP at night time  HYPERTENSION, malignant  - much better controlled this AM  - discontinued ARB due to renal failure, will route discharge note to PCP to make aware   - continue Hydralazine scheduled upon discharge and please note that this was medication that was added to the pt's regimen (pt was not on hydralazine at home) ATRIAL FIBRILLATION WITH RAPID VENTRICULAR RESPONSE  - rate controlled, continue Amiodarone and Coumadin  DM (diabetes mellitus), uncontrolled with complications of neuropathy  - continue Lantus and SSI  Chronic anticoagulation  - coumadin per pharmacy  Anemia of chronic disease  - stable Hg and tat baseline at 7.5 - plan on transfusing if Hg < 7   Consultants:  None Procedures/Studies:  Dg Chest 2 View 02/11/2013 Lungs hypoexpanded. Vascular congestion, cardiomegaly, mild pulmonary edema, ? pneumonia.  Antibiotics:  Levaquin 03/06 -->  Vancomycin 03/08 --> 3/10  Code Status: Full  Family Communication: Pt at bedside and no other family members present, plan d/c in AM   HPI/Subjective: No events overnight. Pt denies chest pain or shortness of breath this AM.   Objective: Filed Vitals:   02/15/13 1356 02/15/13 2045 02/16/13 0557 02/16/13 1338  BP: 152/40 153/47 144/52 145/37  Pulse: 60 65 59 70  Temp: 98.6 F (37 C) 97.9 F (36.6 C) 98 F (36.7 C) 98.2 F (36.8 C)  TempSrc: Oral Oral Oral Oral  Resp: 18 20 18 16   Height:      Weight:   120.6 kg (265 lb 14 oz)   SpO2: 100% 100% 98% 98%    Intake/Output Summary (Last 24 hours) at 02/16/13 1805 Last data filed at 02/16/13 1342  Gross per 24 hour  Intake   1700 ml  Output   3001 ml  Net  -1301 ml    Exam:   General:  Pt is alert, follows commands  appropriately, not in acute distress  Cardiovascular: Regular rate and rhythm, S1/S2, no murmurs, no rubs, no gallops  Respiratory: Clear to auscultation bilaterally, no wheezing, no crackles, decreased breath sounds at bases   Abdomen: Soft, non tender, non distended, bowel sounds present, no guarding  Extremities: +1 bilateral pitting lower extremity edema, pulses DP and PT palpable bilaterally  Neuro: Grossly nonfocal  Data Reviewed: Basic Metabolic Panel:  Recent Labs Lab 02/12/13 0340 02/13/13 0345 02/14/13 0512 02/15/13 0434 02/16/13 0430  NA 139 139 138 140 143  K 4.7 4.2 4.3 4.6 4.5  CL 105 101 102 103 106  CO2 26 30 27 28 29   GLUCOSE 100* 92 98 100* 94  BUN 39* 46* 50* 51* 53*  CREATININE 2.75* 2.93* 2.42* 2.46* 2.47*  CALCIUM 8.5 8.5 8.5 8.5 8.5   CBC:  Recent Labs Lab 02/11/13 0305 02/12/13 0340 02/13/13 0345 02/14/13 0512 02/15/13 0434  WBC 9.6 6.9 7.8 6.9 7.5  NEUTROABS 8.0*  --   --   --   --   HGB 7.5* 7.2* 7.5* 7.5* 7.5*  HCT 25.2* 24.7* 25.6* 25.9* 26.6*  MCV 75.7* 76.5* 76.4* 77.5* 77.8*  PLT 285 250 278 278 299   Cardiac Enzymes:  Recent Labs Lab 02/11/13 0305 02/11/13 0600 02/11/13 1200 02/11/13 1726  TROPONINI <0.30 <0.30 <0.30 <0.30   CBG:  Recent Labs Lab 02/15/13 1706 02/15/13 2114 02/16/13 0749 02/16/13 1145 02/16/13 1616  GLUCAP 137* 151* 90 101* 151*    Recent Results (from the past 240 hour(s))  MRSA PCR SCREENING     Status: None   Collection Time    02/11/13  7:03 AM      Result Value Range Status   MRSA by PCR NEGATIVE  NEGATIVE Final   Comment:            The GeneXpert MRSA Assay (FDA     approved for NASAL specimens     only), is one component of a     comprehensive MRSA colonization     surveillance program. It is not     intended to diagnose MRSA     infection nor to guide or     monitor treatment for     MRSA infections.  CULTURE, BLOOD (ROUTINE X 2)     Status: None   Collection Time     02/12/13  1:30 PM      Result Value Range Status   Specimen Description BLOOD RIGHT ARM   Final   Special Requests BOTTLES DRAWN AEROBIC ONLY 4CC   Final   Culture  Setup Time 02/13/2013 01:16   Final   Culture     Final   Value: STAPHYLOCOCCUS SPECIES (COAGULASE NEGATIVE)     Note: THE SIGNIFICANCE OF ISOLATING THIS ORGANISM FROM A SINGLE SET OF BLOOD CULTURES WHEN MULTIPLE SETS ARE DRAWN  IS UNCERTAIN. PLEASE NOTIFY THE MICROBIOLOGY DEPARTMENT WITHIN ONE WEEK IF SPECIATION AND SENSITIVITIES ARE REQUIRED.     Note: Gram Stain Report Called to,Read Back By and Verified With: Juanetta Gosling RN on 02/13/13 at 22:15 by Christie Nottingham   Report Status 02/14/2013 FINAL   Final  CULTURE, BLOOD (ROUTINE X 2)     Status: None   Collection Time    02/12/13  1:37 PM      Result Value Range Status   Specimen Description BLOOD LEFT ARM   Final   Special Requests BOTTLES DRAWN AEROBIC AND ANAEROBIC 5CC   Final   Culture  Setup Time 02/13/2013 01:16   Final   Culture     Final   Value:        BLOOD CULTURE RECEIVED NO GROWTH TO DATE CULTURE WILL BE HELD FOR 5 DAYS BEFORE ISSUING A FINAL NEGATIVE REPORT   Report Status PENDING   Incomplete  CULTURE, BLOOD (ROUTINE X 2)     Status: None   Collection Time    02/14/13 12:45 PM      Result Value Range Status   Specimen Description BLOOD LEFT ARM   Final   Special Requests BOTTLES DRAWN AEROBIC AND ANAEROBIC 5 CC EA   Final   Culture  Setup Time 02/14/2013 17:00   Final   Culture     Final   Value:        BLOOD CULTURE RECEIVED NO GROWTH TO DATE CULTURE WILL BE HELD FOR 5 DAYS BEFORE ISSUING A FINAL NEGATIVE REPORT   Report Status PENDING   Incomplete  CULTURE, BLOOD (ROUTINE X 2)     Status: None   Collection Time    02/14/13  1:00 PM      Result Value Range Status   Specimen Description BLOOD RIGHT HAND   Final   Special Requests BOTTLES DRAWN AEROBIC AND ANAEROBIC 5 CC EA   Final   Culture  Setup Time 02/14/2013 17:00   Final   Culture     Final   Value:         BLOOD CULTURE RECEIVED NO GROWTH TO DATE CULTURE WILL BE HELD FOR 5 DAYS BEFORE ISSUING A FINAL NEGATIVE REPORT   Report Status PENDING   Incomplete     Scheduled Meds: . allopurinol  100 mg Oral Daily  . amiodarone  200 mg Oral Daily  . amLODipine  5 mg Oral Daily  . atorvastatin  20 mg Oral QHS  . ferrous sulfate  325 mg Oral TID WC  . furosemide  60 mg Oral BID  . hydrALAZINE  50 mg Oral Q8H  . insulin aspart  0-20 Units Subcutaneous TID WC  . insulin glargine  22 Units Subcutaneous BID  . levofloxacin  750 mg Oral Q48H  . pantoprazole  40 mg Oral Daily  . sodium chloride  3 mL Intravenous Q12H  . spironolactone  25 mg Oral Daily  . vancomycin  1,500 mg Intravenous QHS  . warfarin  7.5 mg Oral ONCE-1800  . Warfarin - Pharmacist Dosing Inpatient   Does not apply q1800   Continuous Infusions: . nitroGLYCERIN Stopped (02/12/13 1505)     Debbora Presto, MD  [facility] Pager [phone]  If 7PM-7AM, please contact night-coverage [email or web address] Password [email or web address] 02/16/2013, 6:05 PM   LOS: 5 days

## 2013-02-16 NOTE — Progress Notes (Signed)
Patient placed on CPAP 12 cmH2O with 3L oxygen bled in.  Patient tolerating well at this time.  RT will continue to monitor.

## 2013-02-17 DIAGNOSIS — J9600 Acute respiratory failure, unspecified whether with hypoxia or hypercapnia: Secondary | ICD-10-CM

## 2013-02-17 LAB — CBC
HCT: 28.8 % — ABNORMAL LOW (ref 36.0–46.0)
Platelets: 303 10*3/uL (ref 150–400)
RDW: 20 % — ABNORMAL HIGH (ref 11.5–15.5)
WBC: 7.7 10*3/uL (ref 4.0–10.5)

## 2013-02-17 LAB — BASIC METABOLIC PANEL
Chloride: 101 mEq/L (ref 96–112)
GFR calc Af Amer: 24 mL/min — ABNORMAL LOW (ref >90)
Potassium: 4.5 mEq/L (ref 3.5–5.1)

## 2013-02-17 LAB — GLUCOSE, CAPILLARY
Glucose-Capillary: 89 mg/dL (ref 70–99)
Glucose-Capillary: 94 mg/dL (ref 70–99)

## 2013-02-17 LAB — PROTIME-INR: INR: 2.06 — ABNORMAL HIGH (ref 0.00–1.49)

## 2013-02-17 MED ORDER — WARFARIN SODIUM 5 MG PO TABS
5.0000 mg | ORAL_TABLET | Freq: Once | ORAL | Status: AC
  Administered 2013-02-17: 5 mg via ORAL
  Filled 2013-02-17: qty 1

## 2013-02-17 MED ORDER — AMLODIPINE BESYLATE 10 MG PO TABS
10.0000 mg | ORAL_TABLET | Freq: Every day | ORAL | Status: DC
  Administered 2013-02-18: 10 mg via ORAL
  Filled 2013-02-17: qty 1

## 2013-02-17 NOTE — Progress Notes (Signed)
[facility] PROGRESS NOTE  [name] ZOX:[id number] DOB: 29-Aug-1951 DOA: 02/11/2013 PCP: [name] [name], MD  Assessment/Plan  Acute combined systolic and diastolic heart failure  - appears to be clinically improving and maintaining oxygen saturations at target range  - continue with current dose of Lasix, continue to monitor daily weights, and I's and O's  - please note that dose of Lasix was increased from 40 mg BID to 60 mg BID  - weight trend 272 lbs (03/06) --> 263 lbs (03/07) --> 266 lbs --> 263 lbs, BNP trending down 1700 --> 690 --> 438  - 2D ECHO 3/7 indicative of worsening systolic and diastolic function  - creatinine appears to be stabilizing around 2.4 - 2.5  Will need followup with cardiology as outpatient. Active Problems:  Acute on chronic renal failure  - pt has chronic renal failure and review of records indicated baseline Cr 2.3 - 2.5  - pt has had higher creatinine levels in 3 - 3.5  - creatinine is now stabilizing at the pt's baseline ~2.4 - 2.5  Acute hypoxic respiratory failure  - likely multifactorial and secondary to CHF, ? PNA, Asthma/obstructive sleep apnea  - one set of blood culture positive for staph coag, likely contaminant, repeat cultures 3/9 negative, d/c vancomycin  - no wheezing on exam this AM, continue nebulizer as needed  - Levaquin day #7/7, On discharge will need to resume patient's chronic Levaquin 250 mg daily for suppressive therapy of prosthetic joint infection.  - sputum culture and analysis no growth to date  OBSTRUCTIVE SLEEP APNEA, OHS  - CPAP at night time  HYPERTENSION, malignant  - much better controlled this AM  - discontinued ARB due to renal failure, will route discharge note to PCP to make aware. Increase Norvasc to 10 mg daily.  - continue Hydralazine scheduled upon discharge and please note that this was medication that was added to the pt's regimen (pt was not on hydralazine at home)  ATRIAL FIBRILLATION WITH RAPID  VENTRICULAR RESPONSE  - rate controlled, continue Amiodarone and Coumadin  DM (diabetes mellitus), uncontrolled with complications of neuropathy  - continue Lantus and SSI  Chronic anticoagulation  - coumadin per pharmacy  Anemia of chronic disease  - stable Hg and at baseline at 8.2 - plan on transfusing if Hg < 7    Code Status: fULL Family Communication: Patient no family at bedside Disposition Plan: Home when medically stable.   Consultants:  nONE  Procedures:  Chest x-ray 02/11/2013  2-D echo 02/11/2013  Antibiotics: Levaquin 03/06 -->  Vancomycin 03/08 --> 3/10   HPI/Subjective: Patient states shortness of breath has improved and close to baseline. Patient denies any chest pain.  Objective: Filed Vitals:   02/16/13 1338 02/16/13 2134 02/17/13 0508 02/17/13 1300  BP: 145/37 143/34 154/45 156/35  Pulse: 70  63 57  Temp: 98.2 F (36.8 C) 98 F (36.7 C) 97.9 F (36.6 C) 97.9 F (36.6 C)  TempSrc: Oral Oral Oral Oral  Resp: 16 18 18 18   Height:      Weight:   118.8 kg (261 lb 14.5 oz)   SpO2: 98% 100% 100% 100%    Intake/Output Summary (Last 24 hours) at 02/17/13 1814 Last data filed at 02/17/13 1300  Gross per 24 hour  Intake    960 ml  Output   2750 ml  Net  -1790 ml   Filed Weights   02/15/13 0500 02/16/13 0557 02/17/13 0508  Weight: 119.4 kg (263 lb 3.7 oz)  120.6 kg (265 lb 14 oz) 118.8 kg (261 lb 14.5 oz)    Exam:   General:  NAD  Cardiovascular: RRR. No JVD. 1 + ble EDEMA  Respiratory: Decreased breath sounds in the bases.  Abdomen: Soft, nondistended, positive bowel sounds, nontender  Data Reviewed: Basic Metabolic Panel:  Recent Labs Lab 02/13/13 0345 02/14/13 0512 02/15/13 0434 02/16/13 0430 02/17/13 0458  NA 139 138 140 143 140  K 4.2 4.3 4.6 4.5 4.5  CL 101 102 103 106 101  CO2 30 27 28 29 29   GLUCOSE 92 98 100* 94 104*  BUN 46* 50* 51* 53* 57*  CREATININE 2.93* 2.42* 2.46* 2.47* 2.41*  CALCIUM 8.5 8.5 8.5 8.5 9.0    Liver Function Tests: No results found for this basename: AST, ALT, ALKPHOS, BILITOT, PROT, ALBUMIN,  in the last 168 hours No results found for this basename: LIPASE, AMYLASE,  in the last 168 hours No results found for this basename: AMMONIA,  in the last 168 hours CBC:  Recent Labs Lab 02/11/13 0305 02/12/13 0340 02/13/13 0345 02/14/13 0512 02/15/13 0434 02/17/13 0458  WBC 9.6 6.9 7.8 6.9 7.5 7.7  NEUTROABS 8.0*  --   --   --   --   --   HGB 7.5* 7.2* 7.5* 7.5* 7.5* 8.2*  HCT 25.2* 24.7* 25.6* 25.9* 26.6* 28.8*  MCV 75.7* 76.5* 76.4* 77.5* 77.8* 77.4*  PLT 285 250 278 278 299 303   Cardiac Enzymes:  Recent Labs Lab 02/11/13 0305 02/11/13 0600 02/11/13 1200 02/11/13 1726  TROPONINI <0.30 <0.30 <0.30 <0.30   BNP (last 3 results)  Recent Labs  02/11/13 0305 02/13/13 0345 02/15/13 0434  PROBNP 1719.0* 698.5* 438.6*   CBG:  Recent Labs Lab 02/16/13 1616 02/16/13 2134 02/17/13 0748 02/17/13 1131 02/17/13 1659  GLUCAP 151* 110* 89 94 128*    Recent Results (from the past 240 hour(s))  MRSA PCR SCREENING     Status: None   Collection Time    02/11/13  7:03 AM      Result Value Range Status   MRSA by PCR NEGATIVE  NEGATIVE Final   Comment:            The GeneXpert MRSA Assay (FDA     approved for NASAL specimens     only), is one component of a     comprehensive MRSA colonization     surveillance program. It is not     intended to diagnose MRSA     infection nor to guide or     monitor treatment for     MRSA infections.  CULTURE, BLOOD (ROUTINE X 2)     Status: None   Collection Time    02/12/13  1:30 PM      Result Value Range Status   Specimen Description BLOOD RIGHT ARM   Final   Special Requests BOTTLES DRAWN AEROBIC ONLY 4CC   Final   Culture  Setup Time 02/13/2013 01:16   Final   Culture     Final   Value: STAPHYLOCOCCUS SPECIES (COAGULASE NEGATIVE)     Note: THE SIGNIFICANCE OF ISOLATING THIS ORGANISM FROM A SINGLE SET OF BLOOD  CULTURES WHEN MULTIPLE SETS ARE DRAWN IS UNCERTAIN. PLEASE NOTIFY THE MICROBIOLOGY DEPARTMENT WITHIN ONE WEEK IF SPECIATION AND SENSITIVITIES ARE REQUIRED.     Note: Gram Stain Report Called to,Read Back By and Verified With: Juanetta Gosling RN on 02/13/13 at 22:15 by Christie Nottingham   Report Status 02/14/2013 FINAL  Final  CULTURE, BLOOD (ROUTINE X 2)     Status: None   Collection Time    02/12/13  1:37 PM      Result Value Range Status   Specimen Description BLOOD LEFT ARM   Final   Special Requests BOTTLES DRAWN AEROBIC AND ANAEROBIC 5CC   Final   Culture  Setup Time 02/13/2013 01:16   Final   Culture     Final   Value:        BLOOD CULTURE RECEIVED NO GROWTH TO DATE CULTURE WILL BE HELD FOR 5 DAYS BEFORE ISSUING A FINAL NEGATIVE REPORT   Report Status PENDING   Incomplete  CULTURE, BLOOD (ROUTINE X 2)     Status: None   Collection Time    02/14/13 12:45 PM      Result Value Range Status   Specimen Description BLOOD LEFT ARM   Final   Special Requests BOTTLES DRAWN AEROBIC AND ANAEROBIC 5 CC EA   Final   Culture  Setup Time 02/14/2013 17:00   Final   Culture     Final   Value:        BLOOD CULTURE RECEIVED NO GROWTH TO DATE CULTURE WILL BE HELD FOR 5 DAYS BEFORE ISSUING A FINAL NEGATIVE REPORT   Report Status PENDING   Incomplete  CULTURE, BLOOD (ROUTINE X 2)     Status: None   Collection Time    02/14/13  1:00 PM      Result Value Range Status   Specimen Description BLOOD RIGHT HAND   Final   Special Requests BOTTLES DRAWN AEROBIC AND ANAEROBIC 5 CC EA   Final   Culture  Setup Time 02/14/2013 17:00   Final   Culture     Final   Value:        BLOOD CULTURE RECEIVED NO GROWTH TO DATE CULTURE WILL BE HELD FOR 5 DAYS BEFORE ISSUING A FINAL NEGATIVE REPORT   Report Status PENDING   Incomplete     Studies: No results found.  Scheduled Meds: . allopurinol  100 mg Oral Daily  . amiodarone  200 mg Oral Daily  . amLODipine  5 mg Oral Daily  . atorvastatin  20 mg Oral QHS  . ferrous  sulfate  325 mg Oral TID WC  . furosemide  60 mg Oral BID  . hydrALAZINE  50 mg Oral Q8H  . insulin aspart  0-20 Units Subcutaneous TID WC  . insulin glargine  22 Units Subcutaneous BID  . levofloxacin  750 mg Oral Q48H  . pantoprazole  40 mg Oral Daily  . sodium chloride  3 mL Intravenous Q12H  . spironolactone  25 mg Oral Daily  . warfarin  5 mg Oral ONCE-1800  . Warfarin - Pharmacist Dosing Inpatient   Does not apply q1800   Continuous Infusions: . nitroGLYCERIN Stopped (02/12/13 1505)    Principal Problem:   Acute combined systolic and diastolic heart failure Active Problems:   OBSTRUCTIVE SLEEP APNEA   HYPERTENSION   ATRIAL FIBRILLATION WITH RAPID VENTRICULAR RESPONSE   Obesity hypoventilation syndrome   Prosthetic joint infection   DM (diabetes mellitus)   Chronic anticoagulation    Time spent: 40 mins    [facility]  [facility] Pager [phone]. If 7PM-7AM, please contact night-coverage at [email or web address], password [facility] 02/17/2013, 6:14 PM  LOS: 6 days

## 2013-02-17 NOTE — Progress Notes (Signed)
Patient refuses to use nocturnal CPAP/BiPAP tonight. She states she understands that she is supposed to wear it and does not do so even at home on a routine basis. VSS at this time. Equipment remains set up at the bedside. Informed her to let someone know if she were to change her mind and would like to be placed on it later in the night.

## 2013-02-17 NOTE — Progress Notes (Signed)
Pt ambulated about 40 feet in hallway on RA. Pts O2 sats were 95% at their lowest. However, patient felt short of breath. 1L O2 applied for comfort while sleeping.

## 2013-02-17 NOTE — Progress Notes (Signed)
ANTICOAGULATION CONSULT NOTE - Follow Up  Pharmacy Consult for Warfarin Indication: atrial fibrillation  Patient Measurements: Height: 5\' 3"  (160 cm) Weight: 261 lb 14.5 oz (118.8 kg) IBW/kg (Calculated) : 52.4  Vital Signs: Temp: 97.9 F (36.6 C) (03/12 0508) Temp src: Oral (03/12 0508) BP: 154/45 mmHg (03/12 0508) Pulse Rate: 63 (03/12 0508)  Labs:  Recent Labs  02/15/13 0434 02/16/13 0430 02/17/13 0458  HGB 7.5*  --  8.2*  HCT 26.6*  --  28.8*  PLT 299  --  303  LABPROT 21.0* 21.0* 22.4*  INR 1.89* 1.89* 2.06*  CREATININE 2.46* 2.47* 2.41*    Assessment:  62 yo F admit 3/6 with SOB and CHF.  Patient is on chronic warfarain for afib.  Home dose reported as 3mg  daily and INR on admission was subtherapeutic at 1.71.  Today's INR is now therapeutic. Patient has received warfarin 5mg  x 5 days now, and 7.5mg  x1 last night.   No reported bleeding    Noted potential drug-drug interactions with warfarin:  Amiodarone, levaquin, allopurionol (may prolong INR).  All are ongoing prior to admission medications.  Goal of Therapy:  INR 2-3   Plan:   Warfarin 5mg  PO today  Daily INR  If patient is discharged today, suggest sending home on warfarin 5mg  PO daily since admit INR was subtherapeutic. Suggest next INR check on Fri 3/14.  Darrol Angel, PharmD Pager: [phone] 02/17/2013 10:28 AM

## 2013-02-17 NOTE — Evaluation (Signed)
Physical Therapy Evaluation Patient Details Name: [name] MRN: [id number] DOB: 1951-07-23 Today's Date: 02/17/2013 Time: 4098-1191 PT Time Calculation (min): 14 min  PT Assessment / Plan / Recommendation Clinical Impression  Pt admitted for acute combined systolic and diastolic heart failure.  Pt would benefit from acute PT services in order to improve independence with transfers and ambulation by increasing LE strength and activity tolerance to prepare for d/c home with spouse.  Pt reports she only wears oxygen at night and sats 98% on room air at rest and with ambulation however O2 [place] reapplied after ambulation due to pt request.    PT Assessment  Patient needs continued PT services    Follow Up Recommendations  Home health PT    Does the patient have the potential to tolerate intense rehabilitation      Barriers to Discharge        Equipment Recommendations  None recommended by PT    Recommendations for Other Services     Frequency Min 3X/week    Precautions / Restrictions Precautions Precautions: Fall Restrictions Weight Bearing Restrictions: No   Pertinent Vitals/Pain 98% room air at rest and with ambulation      Mobility  Bed Mobility Bed Mobility: Supine to Sit Supine to Sit: 5: Supervision;HOB elevated;With rails Details for Bed Mobility Assistance: increased time and effort Transfers Transfers: Stand to Sit;Sit to Stand Sit to Stand: 4: Min guard;With upper extremity assist;From bed Stand to Sit: 4: Min guard;With upper extremity assist;To chair/3-in-1 Details for Transfer Assistance: verbal cues for hand placement Ambulation/Gait Ambulation/Gait Assistance: 4: Min guard Ambulation Distance (Feet): 80 Feet Assistive device: Rolling walker Ambulation/Gait Assistance Details: pt ambulated on room air with SaO2 98%, pt reports DOE however states not much worse than usual, limited distance due to fatigue and DOE Gait Pattern: Step-to pattern;Trunk  flexed Gait velocity: decreased General Gait Details: leads with L LE    Exercises     PT Diagnosis: Difficulty walking  PT Problem List: Decreased strength;Decreased mobility;Decreased activity tolerance;Cardiopulmonary status limiting activity PT Treatment Interventions: DME instruction;Gait training;Functional mobility training;Therapeutic activities;Therapeutic exercise;Patient/family education   PT Goals Acute Rehab PT Goals PT Goal Formulation: With patient Time For Goal Achievement: 02/24/13 Potential to Achieve Goals: Good Pt will go Sit to Stand: with modified independence PT Goal: Sit to Stand - Progress: Goal set today Pt will go Stand to Sit: with modified independence PT Goal: Stand to Sit - Progress: Goal set today Pt will Ambulate: 51 - 150 feet;with modified independence;with rolling walker PT Goal: Ambulate - Progress: Goal set today Pt will Perform Home Exercise Program: with supervision, verbal cues required/provided PT Goal: Perform Home Exercise Program - Progress: Goal set today  Visit Information  Last PT Received On: 02/17/13 Assistance Needed: +1    Subjective Data  Subjective: I usually get a little SOB with activity.   Prior Functioning  Home Living Lives With: Spouse Available Help at Discharge: Family Type of Home: House Home Access: Level entry Home Layout: One level Home Adaptive Equipment: Walker - rolling Additional Comments: Pt reports only oxygen at night or applies if very SOB Prior Function Level of Independence: Independent with assistive device(s) Communication Communication: No difficulties    Cognition  Cognition Overall Cognitive Status: Appears within functional limits for tasks assessed/performed Arousal/Alertness: Awake/alert Orientation Level: Appears intact for tasks assessed Behavior During Session: [facility] for tasks performed    Extremity/Trunk Assessment Right Lower Extremity Assessment RLE ROM/Strength/Tone: [facility] for  tasks assessed Left Lower Extremity  Assessment LLE ROM/Strength/Tone: Deficits LLE ROM/Strength/Tone Deficits: pt reports weak L LE, required assist to lift to don sock, observed weakness with gait as well   Balance    End of Session PT - End of Session Equipment Utilized During Treatment: Gait belt Activity Tolerance: Patient limited by fatigue Patient left: in chair;with call bell/phone within reach;Other (comment) (On BSC, call bell in reach, nsg tech in shortly after ) Nurse Communication: Mobility status  GP     LEMYRE,KATHrine E 02/17/2013, 11:00 AM Zenovia Jarred, PT, DPT 02/17/2013 Pager: [phone]

## 2013-02-18 DIAGNOSIS — E119 Type 2 diabetes mellitus without complications: Secondary | ICD-10-CM

## 2013-02-18 LAB — BASIC METABOLIC PANEL
CO2: 27 mEq/L (ref 19–32)
Calcium: 8.7 mg/dL (ref 8.4–10.5)
Creatinine, Ser: 2.51 mg/dL — ABNORMAL HIGH (ref 0.50–1.10)
Glucose, Bld: 96 mg/dL (ref 70–99)

## 2013-02-18 LAB — PROTIME-INR
INR: 2.28 — ABNORMAL HIGH (ref 0.00–1.49)
Prothrombin Time: 24.1 seconds — ABNORMAL HIGH (ref 11.6–15.2)

## 2013-02-18 LAB — CBC
MCH: 21.5 pg — ABNORMAL LOW (ref 26.0–34.0)
MCV: 77.7 fL — ABNORMAL LOW (ref 78.0–100.0)
Platelets: 286 10*3/uL (ref 150–400)
RBC: 3.49 MIL/uL — ABNORMAL LOW (ref 3.87–5.11)
RDW: 20.2 % — ABNORMAL HIGH (ref 11.5–15.5)

## 2013-02-18 LAB — GLUCOSE, CAPILLARY: Glucose-Capillary: 90 mg/dL (ref 70–99)

## 2013-02-18 MED ORDER — WARFARIN SODIUM 5 MG PO TABS
5.0000 mg | ORAL_TABLET | Freq: Once | ORAL | Status: AC
  Administered 2013-02-18: 5 mg via ORAL
  Filled 2013-02-18: qty 1

## 2013-02-18 MED ORDER — AMLODIPINE BESYLATE 10 MG PO TABS: 10.0000 mg | ORAL_TABLET | Freq: Every day | ORAL | Status: DC

## 2013-02-18 MED ORDER — WARFARIN SODIUM 5 MG PO TABS: 5.0000 mg | ORAL_TABLET | Freq: Every day | ORAL | Status: DC

## 2013-02-18 MED ORDER — HYDRALAZINE HCL 50 MG PO TABS: 50.0000 mg | ORAL_TABLET | Freq: Three times a day (TID) | ORAL | Status: DC

## 2013-02-18 MED ORDER — FUROSEMIDE 20 MG PO TABS: 60.0000 mg | ORAL_TABLET | Freq: Two times a day (BID) | ORAL | Status: DC

## 2013-02-18 MED ORDER — OXYCODONE-ACETAMINOPHEN 5-325 MG PO TABS: 1.0000 | ORAL_TABLET | Freq: Three times a day (TID) | ORAL | Status: DC | PRN

## 2013-02-18 NOTE — Progress Notes (Signed)
Home Health Care choices offered to the patient, and patient requested Care [facility]. She has used them in the past and wants to use them again; [facility] with [facility] called for arrangements; Patient is appropriate for the Disease Management program for CHF; B Aarilyn Dye RN,BSN,MHA

## 2013-02-18 NOTE — Discharge Summary (Addendum)
Physician Discharge Summary  [name] AVW:[id number] DOB: May 01, 1951 DOA: 02/11/2013  PCP: Willey Blade, MD  Admit date: 02/11/2013 Discharge date: 02/18/2013  Time spent: 65 minutes  Recommendations for Outpatient Follow-up:  1. Patient is to followup with PCP one week post discharge. On followup patient's blood pressure need to be reassessed as well as her CHF. Patient will need a basic metabolic profile drawn to followup on electrolytes and renal function. Patient also need a CBC done to check up on her hemoglobin. 2. Patient has been prescribed 20 tablets of Percocet 5/325 until she is able to see her PCP Dr. [name] for a refill on her chronic pain medications. 3. Patient is to followup with her cardiologist one week post discharge to followup on her acute on chronic CHF exacerbation. 4. Patient is to followup as per Coumadin clinic on Monday, 02/22/2013 for PT/INR check.  Discharge Diagnoses:  Principal Problem:   Acute combined systolic and diastolic heart failure Active Problems:   OBSTRUCTIVE SLEEP APNEA   HYPERTENSION   ATRIAL FIBRILLATION WITH RAPID VENTRICULAR RESPONSE   Obesity hypoventilation syndrome   Prosthetic joint infection   DM (diabetes mellitus)   Chronic anticoagulation   Discharge Condition: Stable and improved  Diet recommendation: Carb modified  Filed Weights   02/16/13 0557 02/17/13 0508 02/18/13 0517  Weight: 120.6 kg (265 lb 14 oz) 118.8 kg (261 lb 14.5 oz) 119.9 kg (264 lb 5.3 oz)    History of present illness:  [name] is a 62 y.o. female who presents to the ED from home via EMS with SOB. Nonproductive cough and SOB onset on Monday. Used last dose of albuterol on Tuesday and felt somewhat better. Not missing any medication other than albuterol at home. No fever nor chills. Significant orthopnea with the SOB, has a long history of CHF and hospitalizations for it in the past as well she states. Took an extra dose of lasix at home without any  improvement of symptoms. Swelling in BLE is currently at baseline.  In the ED lab workup, clinical exam, and CXR demonstrated CHF, 0 sirs criteria were noted. Hospitalist has been asked to admit for CHF exacerbation.      Hospital Course:  Acute combined systolic and diastolic heart failure  Patient was admitted to the hospital with acute on chronic combined systolic and diastolic heart failure. Patient was initially placed on IV Lasix. Patient's ARB was held secondary to acute on chronic renal failure. Patient was also placed on oxygen cardiac enzymes were cycled which were negative x3. A 2-D echo was obtained which showed worsening systolic and diastolic function. Patient diuresis well improved clinically during the hospitalization and was -15.3 L through this hospitalization. Patient was subsequently transitioned to oral Lasix at an increased dose of 60 mg twice daily. Patient will need to followup with her cardiologist Dr. Sharyn Lull as outpatient. Patient be discharged in stable and improved condition. Active Problems:  Acute on chronic renal failure  On admission patient was noted to be in acute on chronic renal failure. Patient's creatinine went up as high as 2.93. Patient was placed on IV Lasix with good diuresis and good urine output. Patient's renal function improved and was back to her baseline by day of discharge. Patient's baseline noted to be approximately 2.4-2.5. On day of discharge patient's creatinine was 2.51. Patient's ARB was held during the hospitalization and was not continued on discharge. Patient will followup with PCP and cardiologist as outpatient and this will be deferred to  them as to whether to resume patient's ARB. Acute hypoxic respiratory failure   on admission there was some concern of patient be in acute hypoxic respiratory failure which was felt to be multifactorial secondary to acute CHF exacerbation, questionable pneumonia, obstructive sleep apnea/asthma. Blood  cultures were obtained with no growth to date. Patient was initially placed on IV vancomycin and subsequently IV Levaquin. IV vancomycin was discontinued. Patient was treated with IV Levaquin for a total of 7 days. Patient was also diuresis with IV Lasix. Patient improved clinically. On day of discharge the patient sats on room and on ambulation was not less than 95%.  Patient will be discharged home back on her prophylactic dose of Levaquin. Patient will followup with PCP as outpatient.  OBSTRUCTIVE SLEEP APNEA, OHS  - CPAP at night time  HYPERTENSION, malignant  During the hospitalization patient was noted to be hypertensive with systolic blood pressures in the 150s to 160s. Patient's ARB was held secondary to acute on chronic kidney disease. Patient was maintained on her Norvasc. Hydralazine was also added to patient's regimen. Patient's Norvasc was increased to 10 mg daily. Patient's blood pressure improved on this regimen. Patient be discharged in stable condition. Patient will followup with PCP as outpatient. We decided by patient's PCP as to whether to resume patient's ARB. ATRIAL FIBRILLATION WITH RAPID VENTRICULAR RESPONSE  Remained stable during that fibrillation. Patient was maintained on a home regimen of amiodarone for rate control. Patient's Coumadin doses were adjusted per pharmacy and patient be discharged home on 5 mg daily. Patient will need a PT/INR checked on Monday, 02/22/2013. DM (diabetes mellitus), uncontrolled with complications of neuropathy  - continued on Lantus and SSI Anemia of chronic disease Patient was noted to be anemic during the hospitalization. Patient did not have any overt GI bleed. Patient's hemoglobin remained stable throughout the hospitalization. On day of discharge patient's hemoglobin was 7.5. Patient be discharged in stable and improved condition.  The rest of patient's chronic medical issues remained stable throughout the hospitalization the patient  discharged in stable and improved condition.      Procedures: Chest x-ray 02/11/2013  2-D echo 02/11/2013   Consultations:  None  Discharge Exam: Filed Vitals:   02/17/13 0508 02/17/13 1300 02/17/13 2120 02/18/13 0517  BP: 154/45 156/35 149/36 140/41  Pulse: 63 57 57 60  Temp: 97.9 F (36.6 C) 97.9 F (36.6 C) 97.8 F (36.6 C) 97.9 F (36.6 C)  TempSrc: Oral Oral Oral Oral  Resp: 18 18 18 16   Height:      Weight: 118.8 kg (261 lb 14.5 oz)   119.9 kg (264 lb 5.3 oz)  SpO2: 100% 100% 100% 100%    General: NAD Cardiovascular: Irregularly irregular Respiratory: CTAB  Discharge Instructions      Discharge Orders   Future Appointments [facility] Department Dept Phone   07/01/2013 10:00 AM Randall Hiss, MD [facility] [phone]   Future Orders Complete By Expires     Diet Carb Modified  As directed     Discharge instructions  As directed     Comments:      fOLLOW UP WITH DEAN, [name], MD IN 1 WEEK fOLLOW UP FOR pt/inr CHECK ( WARFARIN CHECK) ON Monday 02/22/13    Increase activity slowly  As directed         Medication List    STOP taking these medications       olmesartan 20 MG tablet  Commonly known as:  BENICAR      TAKE these medications       albuterol 108 (90 BASE) MCG/ACT inhaler  Commonly known as:  PROVENTIL HFA;VENTOLIN HFA  Inhale 2 puffs into the lungs every 6 (six) hours as needed. For shortness of breath     allopurinol 100 MG tablet  Commonly known as:  ZYLOPRIM  Take 1 tablet by mouth daily.     amiodarone 200 MG tablet  Commonly known as:  PACERONE  Take 200 mg by mouth daily.     amLODipine 10 MG tablet  Commonly known as:  NORVASC  Take 1 tablet (10 mg total) by mouth daily.     atorvastatin 20 MG tablet  Commonly known as:  LIPITOR  Take 20 mg by mouth at bedtime.     clobetasol ointment 0.05 %  Commonly known as:  TEMOVATE  Apply 1 application topically daily. Use as directed on  legs     ferrous sulfate 325 (65 FE) MG tablet  Take 325 mg by mouth 3 (three) times daily with meals.     furosemide 20 MG tablet  Commonly known as:  LASIX  Take 3 tablets (60 mg total) by mouth 2 (two) times daily.     hydrALAZINE 50 MG tablet  Commonly known as:  APRESOLINE  Take 1 tablet (50 mg total) by mouth every 8 (eight) hours.     insulin aspart 100 UNIT/ML injection  Commonly known as:  novoLOG  Inject 0-20 Units into the skin 3 (three) times daily before meals.     insulin glargine 100 UNIT/ML injection  Commonly known as:  LANTUS  Inject 22 Units into the skin 2 (two) times daily.     levofloxacin 250 MG tablet  Commonly known as:  LEVAQUIN  Take 250 mg by mouth daily.     oxyCODONE-acetaminophen 5-325 MG per tablet  Commonly known as:  PERCOCET/ROXICET  Take 1 tablet by mouth every 8 (eight) hours as needed (Pt states she is on this med at home for chronic Knee pain).     pantoprazole 40 MG tablet  Commonly known as:  PROTONIX  Take 1 tablet (40 mg total) by mouth daily.     spironolactone 25 MG tablet  Commonly known as:  ALDACTONE  Take 25 mg by mouth daily.     warfarin 5 MG tablet  Commonly known as:  COUMADIN  Take 1 tablet (5 mg total) by mouth daily.     zolpidem 5 MG tablet  Commonly known as:  AMBIEN  Take 5 mg by mouth at bedtime as needed. For sleep       Follow-up Information   Follow up with [name], [name], MD. Schedule an appointment as soon as possible for a visit in 1 week.   Contact information:   [address] [place] [address] [phone]       Follow up On 02/22/2013. (COUMADIN CHECK BY PCP)       Follow up with Robynn Pane, MD. Schedule an appointment as soon as possible for a visit in 1 week.   Contact information:   [address] [place] [address] [phone]        The results of significant diagnostics from this hospitalization (including imaging, microbiology, ancillary and  laboratory) are listed below for reference.    Significant Diagnostic Studies: Dg Chest 2 View  02/11/2013  *RADIOLOGY REPORT*  Clinical Data: Shortness of breath.  CHEST - 2 VIEW  Comparison: Chest radiograph performed 08/15/2012  Findings: The lungs are mildly hypoexpanded.  Bibasilar opacities may reflect mild pulmonary edema or possibly pneumonia.  Underlying vascular crowding and vascular congestion are seen.  No definite pleural effusion or pneumothorax is identified.  The heart is mildly enlarged.  No acute osseous abnormalities are seen.  IMPRESSION: Lungs mildly hypoexpanded.  Vascular congestion and mild cardiomegaly; bibasilar opacities may reflect mild pulmonary edema or possibly pneumonia.   Original Report Authenticated By: Tonia Ghent, M.D.     Microbiology: Recent Results (from the past 240 hour(s))  MRSA PCR SCREENING     Status: None   Collection Time    02/11/13  7:03 AM      Result Value Range Status   MRSA by PCR NEGATIVE  NEGATIVE Final   Comment:            The GeneXpert MRSA Assay (FDA     approved for NASAL specimens     only), is one component of a     comprehensive MRSA colonization     surveillance program. It is not     intended to diagnose MRSA     infection nor to guide or     monitor treatment for     MRSA infections.  CULTURE, BLOOD (ROUTINE X 2)     Status: None   Collection Time    02/12/13  1:30 PM      Result Value Range Status   Specimen Description BLOOD RIGHT ARM   Final   Special Requests BOTTLES DRAWN AEROBIC ONLY 4CC   Final   Culture  Setup Time 02/13/2013 01:16   Final   Culture     Final   Value: STAPHYLOCOCCUS SPECIES (COAGULASE NEGATIVE)     Note: THE SIGNIFICANCE OF ISOLATING THIS ORGANISM FROM A SINGLE SET OF BLOOD CULTURES WHEN MULTIPLE SETS ARE DRAWN IS UNCERTAIN. PLEASE NOTIFY THE MICROBIOLOGY DEPARTMENT WITHIN ONE WEEK IF SPECIATION AND SENSITIVITIES ARE REQUIRED.     Note: Gram Stain Report Called to,Read Back By and Verified  With: Juanetta Gosling RN on 02/13/13 at 22:15 by Christie Nottingham   Report Status 02/14/2013 FINAL   Final  CULTURE, BLOOD (ROUTINE X 2)     Status: None   Collection Time    02/12/13  1:37 PM      Result Value Range Status   Specimen Description BLOOD LEFT ARM   Final   Special Requests BOTTLES DRAWN AEROBIC AND ANAEROBIC 5CC   Final   Culture  Setup Time 02/13/2013 01:16   Final   Culture     Final   Value:        BLOOD CULTURE RECEIVED NO GROWTH TO DATE CULTURE WILL BE HELD FOR 5 DAYS BEFORE ISSUING A FINAL NEGATIVE REPORT   Report Status PENDING   Incomplete  CULTURE, BLOOD (ROUTINE X 2)     Status: None   Collection Time    02/14/13 12:45 PM      Result Value Range Status   Specimen Description BLOOD LEFT ARM   Final   Special Requests BOTTLES DRAWN AEROBIC AND ANAEROBIC 5 CC EA   Final   Culture  Setup Time 02/14/2013 17:00   Final   Culture     Final   Value:        BLOOD CULTURE RECEIVED NO GROWTH TO DATE CULTURE WILL BE HELD FOR 5 DAYS BEFORE ISSUING A FINAL NEGATIVE REPORT   Report Status PENDING   Incomplete  CULTURE, BLOOD (  ROUTINE X 2)     Status: None   Collection Time    02/14/13  1:00 PM      Result Value Range Status   Specimen Description BLOOD RIGHT HAND   Final   Special Requests BOTTLES DRAWN AEROBIC AND ANAEROBIC 5 CC EA   Final   Culture  Setup Time 02/14/2013 17:00   Final   Culture     Final   Value:        BLOOD CULTURE RECEIVED NO GROWTH TO DATE CULTURE WILL BE HELD FOR 5 DAYS BEFORE ISSUING A FINAL NEGATIVE REPORT   Report Status PENDING   Incomplete     Labs: Basic Metabolic Panel:  Recent Labs Lab 02/14/13 0512 02/15/13 0434 02/16/13 0430 02/17/13 0458 02/18/13 0410  NA 138 140 143 140 138  K 4.3 4.6 4.5 4.5 4.3  CL 102 103 106 101 101  CO2 27 28 29 29 27   GLUCOSE 98 100* 94 104* 96  BUN 50* 51* 53* 57* 63*  CREATININE 2.42* 2.46* 2.47* 2.41* 2.51*  CALCIUM 8.5 8.5 8.5 9.0 8.7   Liver Function Tests: No results found for this basename: AST,  ALT, ALKPHOS, BILITOT, PROT, ALBUMIN,  in the last 168 hours No results found for this basename: LIPASE, AMYLASE,  in the last 168 hours No results found for this basename: AMMONIA,  in the last 168 hours CBC:  Recent Labs Lab 02/13/13 0345 02/14/13 0512 02/15/13 0434 02/17/13 0458 02/18/13 0410  WBC 7.8 6.9 7.5 7.7 6.5  HGB 7.5* 7.5* 7.5* 8.2* 7.5*  HCT 25.6* 25.9* 26.6* 28.8* 27.1*  MCV 76.4* 77.5* 77.8* 77.4* 77.7*  PLT 278 278 299 303 286   Cardiac Enzymes:  Recent Labs Lab 02/11/13 1726  TROPONINI <0.30   BNP: BNP (last 3 results)  Recent Labs  02/11/13 0305 02/13/13 0345 02/15/13 0434  PROBNP 1719.0* 698.5* 438.6*   CBG:  Recent Labs Lab 02/17/13 0748 02/17/13 1131 02/17/13 1659 02/17/13 2118 02/18/13 0750  GLUCAP 89 94 128* 145* 90       Signed:  THOMPSON,DANIEL  [facility] 02/18/2013, 12:00 PM

## 2013-02-19 LAB — CULTURE, BLOOD (ROUTINE X 2)

## 2013-02-20 LAB — CULTURE, BLOOD (ROUTINE X 2)

## 2013-06-12 ENCOUNTER — Encounter (HOSPITAL_COMMUNITY): Payer: Self-pay

## 2013-06-12 ENCOUNTER — Emergency Department (HOSPITAL_COMMUNITY)
Admission: EM | Admit: 2013-06-12 | Discharge: 2013-06-12 | Disposition: A | Discharge status: Home / Self Care | Payer: Medicare HMO | Attending: Emergency Medicine | Admitting: Emergency Medicine

## 2013-06-12 DIAGNOSIS — G4733 Obstructive sleep apnea (adult) (pediatric): Secondary | ICD-10-CM | POA: Insufficient documentation

## 2013-06-12 DIAGNOSIS — Z91040 Latex allergy status: Secondary | ICD-10-CM | POA: Insufficient documentation

## 2013-06-12 DIAGNOSIS — F329 Major depressive disorder, single episode, unspecified: Secondary | ICD-10-CM | POA: Insufficient documentation

## 2013-06-12 DIAGNOSIS — M79609 Pain in unspecified limb: Secondary | ICD-10-CM

## 2013-06-12 DIAGNOSIS — Z880 Allergy status to penicillin: Secondary | ICD-10-CM | POA: Insufficient documentation

## 2013-06-12 DIAGNOSIS — J309 Allergic rhinitis, unspecified: Secondary | ICD-10-CM | POA: Insufficient documentation

## 2013-06-12 DIAGNOSIS — M7989 Other specified soft tissue disorders: Secondary | ICD-10-CM

## 2013-06-12 DIAGNOSIS — Z8673 Personal history of transient ischemic attack (TIA), and cerebral infarction without residual deficits: Secondary | ICD-10-CM | POA: Insufficient documentation

## 2013-06-12 DIAGNOSIS — R21 Rash and other nonspecific skin eruption: Secondary | ICD-10-CM | POA: Insufficient documentation

## 2013-06-12 DIAGNOSIS — IMO0001 Reserved for inherently not codable concepts without codable children: Secondary | ICD-10-CM | POA: Insufficient documentation

## 2013-06-12 DIAGNOSIS — Z8739 Personal history of other diseases of the musculoskeletal system and connective tissue: Secondary | ICD-10-CM | POA: Insufficient documentation

## 2013-06-12 DIAGNOSIS — Z87891 Personal history of nicotine dependence: Secondary | ICD-10-CM | POA: Insufficient documentation

## 2013-06-12 DIAGNOSIS — I4891 Unspecified atrial fibrillation: Secondary | ICD-10-CM | POA: Insufficient documentation

## 2013-06-12 DIAGNOSIS — F3289 Other specified depressive episodes: Secondary | ICD-10-CM | POA: Insufficient documentation

## 2013-06-12 DIAGNOSIS — Z8619 Personal history of other infectious and parasitic diseases: Secondary | ICD-10-CM | POA: Insufficient documentation

## 2013-06-12 DIAGNOSIS — E119 Type 2 diabetes mellitus without complications: Secondary | ICD-10-CM | POA: Insufficient documentation

## 2013-06-12 DIAGNOSIS — Z8709 Personal history of other diseases of the respiratory system: Secondary | ICD-10-CM | POA: Insufficient documentation

## 2013-06-12 DIAGNOSIS — I129 Hypertensive chronic kidney disease with stage 1 through stage 4 chronic kidney disease, or unspecified chronic kidney disease: Secondary | ICD-10-CM | POA: Insufficient documentation

## 2013-06-12 DIAGNOSIS — J45909 Unspecified asthma, uncomplicated: Secondary | ICD-10-CM | POA: Insufficient documentation

## 2013-06-12 DIAGNOSIS — E785 Hyperlipidemia, unspecified: Secondary | ICD-10-CM | POA: Insufficient documentation

## 2013-06-12 DIAGNOSIS — Z733 Stress, not elsewhere classified: Secondary | ICD-10-CM | POA: Insufficient documentation

## 2013-06-12 DIAGNOSIS — Z794 Long term (current) use of insulin: Secondary | ICD-10-CM | POA: Insufficient documentation

## 2013-06-12 DIAGNOSIS — D649 Anemia, unspecified: Secondary | ICD-10-CM | POA: Insufficient documentation

## 2013-06-12 DIAGNOSIS — Z79899 Other long term (current) drug therapy: Secondary | ICD-10-CM | POA: Insufficient documentation

## 2013-06-12 DIAGNOSIS — N183 Chronic kidney disease, stage 3 unspecified: Secondary | ICD-10-CM | POA: Insufficient documentation

## 2013-06-12 DIAGNOSIS — I509 Heart failure, unspecified: Secondary | ICD-10-CM | POA: Insufficient documentation

## 2013-06-12 DIAGNOSIS — E669 Obesity, unspecified: Secondary | ICD-10-CM | POA: Insufficient documentation

## 2013-06-12 DIAGNOSIS — Z8679 Personal history of other diseases of the circulatory system: Secondary | ICD-10-CM | POA: Insufficient documentation

## 2013-06-12 DIAGNOSIS — Z87442 Personal history of urinary calculi: Secondary | ICD-10-CM | POA: Insufficient documentation

## 2013-06-12 DIAGNOSIS — Z872 Personal history of diseases of the skin and subcutaneous tissue: Secondary | ICD-10-CM | POA: Insufficient documentation

## 2013-06-12 DIAGNOSIS — Z7901 Long term (current) use of anticoagulants: Secondary | ICD-10-CM | POA: Insufficient documentation

## 2013-06-12 DIAGNOSIS — Z862 Personal history of diseases of the blood and blood-forming organs and certain disorders involving the immune mechanism: Secondary | ICD-10-CM | POA: Insufficient documentation

## 2013-06-12 DIAGNOSIS — Z8639 Personal history of other endocrine, nutritional and metabolic disease: Secondary | ICD-10-CM | POA: Insufficient documentation

## 2013-06-12 LAB — POCT I-STAT, CHEM 8
BUN: 54 mg/dL — ABNORMAL HIGH (ref 6–23)
Chloride: 117 mEq/L — ABNORMAL HIGH (ref 96–112)
Creatinine, Ser: 2.2 mg/dL — ABNORMAL HIGH (ref 0.50–1.10)
Hemoglobin: 8.2 g/dL — ABNORMAL LOW (ref 12.0–15.0)
Potassium: 4.5 mEq/L (ref 3.5–5.1)
Sodium: 146 mEq/L — ABNORMAL HIGH (ref 135–145)

## 2013-06-12 LAB — CBC WITH DIFFERENTIAL/PLATELET
Basophils Absolute: 0 10*3/uL (ref 0.0–0.1)
Basophils Relative: 0 % (ref 0–1)
Eosinophils Relative: 4 % (ref 0–5)
HCT: 26.2 % — ABNORMAL LOW (ref 36.0–46.0)
Hemoglobin: 7.6 g/dL — ABNORMAL LOW (ref 12.0–15.0)
Lymphocytes Relative: 11 % — ABNORMAL LOW (ref 12–46)
Monocytes Relative: 4 % (ref 3–12)
Neutro Abs: 6.6 10*3/uL (ref 1.7–7.7)
WBC: 8.1 10*3/uL (ref 4.0–10.5)

## 2013-06-12 LAB — PROTIME-INR: INR: 1.96 — ABNORMAL HIGH (ref 0.00–1.49)

## 2013-06-12 MED ORDER — OXYCODONE-ACETAMINOPHEN 5-325 MG PO TABS
2.0000 | ORAL_TABLET | Freq: Once | ORAL | Status: AC
  Administered 2013-06-12: 2 via ORAL
  Filled 2013-06-12: qty 2

## 2013-06-12 NOTE — ED Provider Notes (Signed)
History    CSN: [id number] Arrival date & time 06/12/13  1141  First MD Initiated Contact with Patient 06/12/13 1320     Chief Complaint  Patient presents with  . Leg Pain   (Consider location/radiation/quality/duration/timing/severity/associated sxs/prior Treatment) HPI   Patient complains of left leg pain for several weeks. Pain is worse with weightbearing. She is unable to bear weight the past 2 days. She denies injury denies fever denies other complaint. Patient reports he ran out of Percocet approximately one week ago. He denies shortness of breath denies chest pain denies other associated symptoms. Past Medical History  Diagnosis Date  . Asthma   . Bronchitis   . Allergic rhinitis   . Diabetes mellitus   . HTN (hypertension)   . DJD (degenerative joint disease)   . Hyperlipidemia   . Complication of anesthesia     trouble waking up  . Obesity   . OSA (obstructive sleep apnea)     poor compliance with cpap  . CHF (congestive heart failure)   . Biventricular failure     compensated  . Psoriasis   . Stasis edema     of lower extremities  . Depression   . Situational stress   . Stroke   . Kidney stones   . Kidney disease, chronic, stage III (GFR 30-59 ml/min)   . Anemia   . Gout   . Chronic anticoagulation   . Yeast infection involving the vagina and surrounding area   . Atrial fibrillation with RVR    psoriasis Past Surgical History  Procedure Laterality Date  . Appendectomy    . Cholecystectomy    . Total knee arthroplasty    . Total abdominal hysterectomy    . Back surgery    . Cystectomy      left hand   Family History  Problem Relation Age of Onset  . Heart attack Father   . Asthma Father   . Heart disease Paternal Uncle   . Rectal cancer Paternal Aunt   . Other Mother     mva   History  Substance Use Topics  . Smoking status: Former Smoker    Types: Cigarettes  . Smokeless tobacco: Never Used     Comment: smoked for 1.5 yrs about 2 cigs a  day ,only if stressed  . Alcohol Use: No   OB History   Grav Para Term Preterm Abortions TAB SAB Ect Mult Living                 Review of Systems  Constitutional: Negative.   HENT: Negative.   Respiratory: Negative.   Cardiovascular: Negative.   Gastrointestinal: Negative.   Musculoskeletal: Positive for myalgias.  Skin: Positive for rash.  Neurological: Negative.        Gait disorder has walker and wheelchair  Psychiatric/Behavioral: Negative.   All other systems reviewed and are negative.    Allergies  Daptomycin; Morphine and related; Cephalexin; Celecoxib; Codeine; Fluoxetine hcl; Latex; Ofloxacin; Penicillins; Rofecoxib; and Sulfonamide derivatives  Home Medications   Current Outpatient Rx  Name  Route  Sig  Dispense  Refill  . albuterol (PROVENTIL HFA;VENTOLIN HFA) 108 (90 BASE) MCG/ACT inhaler   Inhalation   Inhale 2 puffs into the lungs every 6 (six) hours as needed for wheezing or shortness of breath.          . allopurinol (ZYLOPRIM) 100 MG tablet   Oral   Take 1 tablet by mouth daily.         [name]  amiodarone (PACERONE) 200 MG tablet   Oral   Take 200 mg by mouth daily.         [name] amLODipine (NORVASC) 10 MG tablet   Oral   Take 1 tablet (10 mg total) by mouth daily.   30 tablet   0   . atorvastatin (LIPITOR) 20 MG tablet   Oral   Take 20 mg by mouth at bedtime.         . ferrous sulfate 325 (65 FE) MG tablet   Oral   Take 325 mg by mouth 3 (three) times daily with meals.         . furosemide (LASIX) 20 MG tablet   Oral   Take 3 tablets (60 mg total) by mouth 2 (two) times daily.   180 tablet   0   . hydrALAZINE (APRESOLINE) 50 MG tablet   Oral   Take 1 tablet (50 mg total) by mouth every 8 (eight) hours.   90 tablet   0   . insulin aspart (NOVOLOG) 100 UNIT/ML injection   Subcutaneous   Inject 0-20 Units into the skin 3 (three) times daily before meals.         . insulin glargine (LANTUS) 100 UNIT/ML injection    Subcutaneous   Inject 20 Units into the skin 2 (two) times daily.         [name] levofloxacin (LEVAQUIN) 250 MG tablet   Oral   Take 250 mg by mouth daily.         [name] oxyCODONE-acetaminophen (PERCOCET/ROXICET) 5-325 MG per tablet   Oral   Take 1 tablet by mouth every 8 (eight) hours as needed for pain.         . pantoprazole (PROTONIX) 40 MG tablet   Oral   Take 1 tablet (40 mg total) by mouth daily.   30 tablet   2   . spironolactone (ALDACTONE) 25 MG tablet   Oral   Take 25 mg by mouth daily.           [name] warfarin (COUMADIN) 5 MG tablet   Oral   Take 7.5-10 mg by mouth daily. 2 tabs on day 1, 2 days on day 2, then 1.5 tabs on day 3, repeat cycle.         . zolpidem (AMBIEN) 5 MG tablet   Oral   Take 5 mg by mouth at bedtime as needed for sleep.           BP 164/50  Pulse 78  Resp 18  SpO2 96% Physical Exam  Nursing note and vitals reviewed. Constitutional: No distress.  Chronically ill-appearing  HENT:  Head: Normocephalic and atraumatic.  Eyes: Conjunctivae are normal. Pupils are equal, round, and reactive to light.  Neck: Neck supple. No tracheal deviation present. No thyromegaly present.  Cardiovascular: Normal rate and regular rhythm.   No murmur heard. Pulmonary/Chest: Effort normal and breath sounds normal.  Abdominal: Soft. Bowel sounds are normal. She exhibits no distension. There is no tenderness.  Morbidly obese nontender  Musculoskeletal: Normal range of motion. She exhibits no edema and no tenderness.  Bilateral leg edema below the knees. Left lower extremity mildly red and with grayish chronic appearing rash below the knee with the skinthickened. Patient with psoriatic thickened grayish appearing rash on buttocks   Neurological: She is alert. Coordination normal.  Skin: Skin is warm and dry. No rash noted.  Psychiatric: She has a normal mood and affect.    ED Course  Procedures (including critical care time) Labs Reviewed - No data to  display No results found. No diagnosis found. 4:40 PM patient feels improved after treatment with Percocet.  Spoke with Dr. [name] 5:10 PM. He will see patient next week in office Results for orders placed during the hospital encounter of 06/12/13  PROTIME-INR      Result Value Range   Prothrombin Time 21.7 (*) 11.6 - 15.2 seconds   INR 1.96 (*) 0.00 - 1.49  CBC WITH DIFFERENTIAL      Result Value Range   WBC 8.1  4.0 - 10.5 K/uL   RBC 3.58 (*) 3.87 - 5.11 MIL/uL   Hemoglobin 7.6 (*) 12.0 - 15.0 g/dL   HCT 16.1 (*) 09.6 - 04.5 %   MCV 73.2 (*) 78.0 - 100.0 fL   MCH 21.2 (*) 26.0 - 34.0 pg   MCHC 29.0 (*) 30.0 - 36.0 g/dL   RDW 40.9 (*) 81.1 - 91.4 %   Platelets 297  150 - 400 K/uL   Neutrophils Relative % 81 (*) 43 - 77 %   Lymphocytes Relative 11 (*) 12 - 46 %   Monocytes Relative 4  3 - 12 %   Eosinophils Relative 4  0 - 5 %   Basophils Relative 0  0 - 1 %   Neutro Abs 6.6  1.7 - 7.7 K/uL   Lymphs Abs 0.9  0.7 - 4.0 K/uL   Monocytes Absolute 0.3  0.1 - 1.0 K/uL   Eosinophils Absolute 0.3  0.0 - 0.7 K/uL   Basophils Absolute 0.0  0.0 - 0.1 K/uL   RBC Morphology POLYCHROMASIA PRESENT    D-DIMER, QUANTITATIVE      Result Value Range   D-Dimer, Quant 2.23 (*) 0.00 - 0.48 ug/mL-FEU   No results found.  MDM  Patient signed out to Dr. Rubin Payor 4:50 PM. Her anemia is chronic and stable Will rule out DVT. If negative for DVT patient has no acute medical problem Diagnosis #1 chronic left leg pain  #2 anemia   Doug Sou, MD 06/12/13 1719

## 2013-06-12 NOTE — ED Provider Notes (Signed)
Laboratories reassuring and that was negative. Dr. [name] will see in followup.  Juliet Rude. Rubin Payor, MD 06/12/13 [phone]

## 2013-06-12 NOTE — ED Notes (Signed)
She states she has been being treated for "left leg infection" by her PCP.  She states she is currently on Levaquin.  She is here today with a worsening of the pain in her left leg, and for two days now "it is too painful to walk". Heretofore she had been ambulatory.  She has dry, flaky hyperpigmented skin on bilat. Lower extremities, which appear as typical stasis changes.  She is in no distress.  She requests to remain seated in her w/c as "it's too painful for me to get up on the bed".

## 2013-06-18 ENCOUNTER — Telehealth: Payer: Self-pay | Admitting: *Deleted

## 2013-06-18 NOTE — Telephone Encounter (Signed)
Patient seen in the ED for leg pain on 06/12/13 and DVT ruled out and labs were normal. They told her to f/u with her PCP Dr. [name], but she prefers to see Dr. Daiva Eves. She already has a 6 month f/u scheduled for 07/01/13 and Dr. Daiva Eves has no other openings for the month of July. She will wait for Dr. Diamantina Providence office to call her back and I told her I would forward her message to Dr. Daiva Eves. Wendall Mola

## 2013-06-21 DIAGNOSIS — B351 Tinea unguium: Secondary | ICD-10-CM | POA: Insufficient documentation

## 2013-06-22 NOTE — Telephone Encounter (Signed)
Patient notified to keep appt 07/01/13 [name]

## 2013-06-22 NOTE — Telephone Encounter (Signed)
I am NOT a PCP and I have zero wiggle room for appts this month and still in process of rescheduling folks fromt he [facility]st[place] that I can cover Dr. Orvan Falconer that Thursday on the inside

## 2013-06-29 ENCOUNTER — Other Ambulatory Visit: Payer: Self-pay | Admitting: Internal Medicine

## 2013-07-01 ENCOUNTER — Ambulatory Visit (INDEPENDENT_AMBULATORY_CARE_PROVIDER_SITE_OTHER): Payer: Medicare HMO | Admitting: Infectious Disease

## 2013-07-01 ENCOUNTER — Encounter: Payer: Self-pay | Admitting: Infectious Disease

## 2013-07-01 VITALS — BP 154/69 | HR 81 | Temp 98.0°F

## 2013-07-01 DIAGNOSIS — Z5189 Encounter for other specified aftercare: Secondary | ICD-10-CM

## 2013-07-01 DIAGNOSIS — B951 Streptococcus, group B, as the cause of diseases classified elsewhere: Secondary | ICD-10-CM

## 2013-07-01 DIAGNOSIS — A491 Streptococcal infection, unspecified site: Secondary | ICD-10-CM

## 2013-07-01 DIAGNOSIS — T8450XD Infection and inflammatory reaction due to unspecified internal joint prosthesis, subsequent encounter: Secondary | ICD-10-CM

## 2013-07-01 DIAGNOSIS — E1159 Type 2 diabetes mellitus with other circulatory complications: Secondary | ICD-10-CM

## 2013-07-01 DIAGNOSIS — I4891 Unspecified atrial fibrillation: Secondary | ICD-10-CM

## 2013-07-01 LAB — BASIC METABOLIC PANEL WITH GFR
CO2: 20 mEq/L (ref 19–32)
Calcium: 8.4 mg/dL (ref 8.4–10.5)
Sodium: 142 mEq/L (ref 135–145)

## 2013-07-01 LAB — CBC WITH DIFFERENTIAL/PLATELET
Eosinophils Relative: 2 % (ref 0–5)
Lymphocytes Relative: 10 % — ABNORMAL LOW (ref 12–46)
Lymphs Abs: 0.8 10*3/uL (ref 0.7–4.0)
MCV: 69.6 fL — ABNORMAL LOW (ref 78.0–100.0)
Neutrophils Relative %: 83 % — ABNORMAL HIGH (ref 43–77)
Platelets: 336 10*3/uL (ref 150–400)
RBC: 3.25 MIL/uL — ABNORMAL LOW (ref 3.87–5.11)
WBC: 7.5 10*3/uL (ref 4.0–10.5)

## 2013-07-01 MED ORDER — LEVOFLOXACIN 250 MG PO TABS: 250.0000 mg | ORAL_TABLET | Freq: Every day | ORAL | Status: DC

## 2013-07-01 NOTE — Progress Notes (Signed)
Subjective:    Patient ID: [name], female    DOB: 07/21/1951, 62 y.o.   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 R to clindamycin. (she also did have gout crystals in the joint. She was on chronic levofloxacin for suppressive therapy. According to a phone note by my partner Dr. Drue Second the patient had been apparently changed from levofloxacin to doxycycline in August 2013 with apparent flareup of her swelling in her knee. Since then she had gone back onto levaquin with improvement in her knee pain.   She had been followed by Dr Renae Fickle and Dr Luiz Blare but apparently has large bill with their practice and claims this as reason she no longer follows there.   She has been seen at [facility] whre she was told her only option was [facility] and she is not seen there.   wE have continued to follow her on renally dosed levaquin which she is taking faithfully.  A few weeks ago she had pain and swelling behind knee and in leg and went to ED where doppler was negative. She states since then she has been better able to bear weight she claims though my CMA had to help into the bathroom yet again. SHe denies fevers, chills.    Review of Systems  Constitutional: Negative for fever, chills, diaphoresis, activity change, appetite change, fatigue and unexpected weight change.  HENT: Negative for congestion, sore throat, rhinorrhea, sneezing, trouble swallowing and sinus pressure.   Eyes: Negative for photophobia and visual disturbance.  Respiratory: Negative for cough, chest tightness, shortness of breath, wheezing and stridor.   Cardiovascular: Negative for chest pain, palpitations and leg swelling.  Gastrointestinal: Negative for nausea, vomiting, abdominal pain, diarrhea, constipation, blood in  stool, abdominal distention and anal bleeding.  Genitourinary: Negative for dysuria, hematuria, flank pain and difficulty urinating.  Musculoskeletal: Positive for myalgias, joint swelling, arthralgias and gait problem. Negative for back pain.  Skin: Negative for color change, pallor, rash and wound.  Neurological: Negative for dizziness, tremors, weakness and light-headedness.  Hematological: Negative for adenopathy. Does not bruise/bleed easily.  Psychiatric/Behavioral: Negative for behavioral problems, confusion, sleep disturbance, dysphoric mood, decreased concentration and agitation.       Objective:   Physical Exam  Constitutional: She is oriented to person, place, and time. She appears well-developed and well-nourished. No distress.  HENT:  Head: Normocephalic and atraumatic.  Mouth/Throat: Oropharynx is clear and moist. No oropharyngeal exudate.  Eyes: Conjunctivae and EOM are normal. Pupils are equal, round, and reactive to light. No scleral icterus.  Neck: Normal range of motion. Neck supple. No JVD present.  Cardiovascular: Normal rate, regular rhythm and normal heart sounds.  Exam reveals no gallop and no friction rub.   No murmur heard. Pulmonary/Chest: Effort normal and breath sounds normal. No respiratory distress. She has no wheezes. She has no rales. She exhibits no tenderness.  Abdominal: She exhibits no distension and no mass. There is no tenderness. There is no rebound and no guarding.  Musculoskeletal: She exhibits no edema and no tenderness.       Legs: Lymphadenopathy:    She has no cervical adenopathy.  Neurological: She is alert and oriented to person, place, and time. Coordination normal.  Skin: Skin is warm and dry. She is not diaphoretic. No erythema. No  pallor.  Psychiatric: She has a normal mood and affect. Her behavior is normal. Judgment and thought content normal.          Assessment & Plan:  Prosthetic joint infection with GBS: continue  suppressive levaquin for now. Recheck ESR today (was in 130s at last check) keeping in mind that gout or  It told her that she would very likely need such a surgery for CURE but we can look into 3rd opinions such as at    [facility] or [facility] (already seen at [facility])   GBS: was culprit organism\  DM: followed by PCP  Morbid obesity: not going to lose weight with exercise as seh cannot ambulate in fxnal way

## 2013-07-02 ENCOUNTER — Telehealth: Payer: Self-pay | Admitting: Infectious Disease

## 2013-07-02 LAB — SEDIMENTATION RATE: Sed Rate: >140 mm/hr — ABNORMAL HIGH (ref 0–22)

## 2013-07-02 NOTE — Telephone Encounter (Signed)
Pts hgb was below 7 which is threshold for transfusion in asymptomatic pts ,and I actually think she maybe symptomatic with her sig weakness. I have called and left message asking for her to go to ED For blood transfusion.

## 2013-07-05 ENCOUNTER — Inpatient Hospital Stay (HOSPITAL_COMMUNITY)
Admission: EM | Admit: 2013-07-05 | Discharge: 2013-07-07 | DRG: 812 | Disposition: A | Discharge status: Home / Self Care | Payer: Medicare HMO | Attending: Internal Medicine | Admitting: Internal Medicine

## 2013-07-05 ENCOUNTER — Emergency Department (HOSPITAL_COMMUNITY): Payer: Medicare HMO

## 2013-07-05 ENCOUNTER — Encounter (HOSPITAL_COMMUNITY): Payer: Self-pay | Admitting: *Deleted

## 2013-07-05 DIAGNOSIS — Z882 Allergy status to sulfonamides status: Secondary | ICD-10-CM

## 2013-07-05 DIAGNOSIS — I5042 Chronic combined systolic (congestive) and diastolic (congestive) heart failure: Secondary | ICD-10-CM | POA: Diagnosis present

## 2013-07-05 DIAGNOSIS — Z7901 Long term (current) use of anticoagulants: Secondary | ICD-10-CM

## 2013-07-05 DIAGNOSIS — M109 Gout, unspecified: Secondary | ICD-10-CM | POA: Diagnosis present

## 2013-07-05 DIAGNOSIS — Z87891 Personal history of nicotine dependence: Secondary | ICD-10-CM

## 2013-07-05 DIAGNOSIS — Z8673 Personal history of transient ischemic attack (TIA), and cerebral infarction without residual deficits: Secondary | ICD-10-CM

## 2013-07-05 DIAGNOSIS — Z825 Family history of asthma and other chronic lower respiratory diseases: Secondary | ICD-10-CM

## 2013-07-05 DIAGNOSIS — Z8249 Family history of ischemic heart disease and other diseases of the circulatory system: Secondary | ICD-10-CM

## 2013-07-05 DIAGNOSIS — D649 Anemia, unspecified: Secondary | ICD-10-CM

## 2013-07-05 DIAGNOSIS — Z888 Allergy status to other drugs, medicaments and biological substances status: Secondary | ICD-10-CM

## 2013-07-05 DIAGNOSIS — I480 Paroxysmal atrial fibrillation: Secondary | ICD-10-CM | POA: Diagnosis present

## 2013-07-05 DIAGNOSIS — L409 Psoriasis, unspecified: Secondary | ICD-10-CM | POA: Diagnosis present

## 2013-07-05 DIAGNOSIS — I129 Hypertensive chronic kidney disease with stage 1 through stage 4 chronic kidney disease, or unspecified chronic kidney disease: Secondary | ICD-10-CM | POA: Diagnosis present

## 2013-07-05 DIAGNOSIS — I509 Heart failure, unspecified: Secondary | ICD-10-CM | POA: Diagnosis present

## 2013-07-05 DIAGNOSIS — I872 Venous insufficiency (chronic) (peripheral): Secondary | ICD-10-CM | POA: Diagnosis present

## 2013-07-05 DIAGNOSIS — G4733 Obstructive sleep apnea (adult) (pediatric): Secondary | ICD-10-CM | POA: Diagnosis present

## 2013-07-05 DIAGNOSIS — Z9089 Acquired absence of other organs: Secondary | ICD-10-CM

## 2013-07-05 DIAGNOSIS — E1129 Type 2 diabetes mellitus with other diabetic kidney complication: Secondary | ICD-10-CM | POA: Diagnosis present

## 2013-07-05 DIAGNOSIS — L408 Other psoriasis: Secondary | ICD-10-CM | POA: Diagnosis present

## 2013-07-05 DIAGNOSIS — Z79899 Other long term (current) drug therapy: Secondary | ICD-10-CM

## 2013-07-05 DIAGNOSIS — F329 Major depressive disorder, single episode, unspecified: Secondary | ICD-10-CM | POA: Diagnosis present

## 2013-07-05 DIAGNOSIS — Z880 Allergy status to penicillin: Secondary | ICD-10-CM

## 2013-07-05 DIAGNOSIS — Z6841 Body Mass Index (BMI) 40.0 and over, adult: Secondary | ICD-10-CM

## 2013-07-05 DIAGNOSIS — E785 Hyperlipidemia, unspecified: Secondary | ICD-10-CM | POA: Diagnosis present

## 2013-07-05 DIAGNOSIS — Z91040 Latex allergy status: Secondary | ICD-10-CM

## 2013-07-05 DIAGNOSIS — F3289 Other specified depressive episodes: Secondary | ICD-10-CM | POA: Diagnosis present

## 2013-07-05 DIAGNOSIS — N184 Chronic kidney disease, stage 4 (severe): Secondary | ICD-10-CM | POA: Diagnosis present

## 2013-07-05 DIAGNOSIS — E119 Type 2 diabetes mellitus without complications: Secondary | ICD-10-CM | POA: Diagnosis present

## 2013-07-05 DIAGNOSIS — Z96659 Presence of unspecified artificial knee joint: Secondary | ICD-10-CM

## 2013-07-05 DIAGNOSIS — Z794 Long term (current) use of insulin: Secondary | ICD-10-CM

## 2013-07-05 DIAGNOSIS — I10 Essential (primary) hypertension: Secondary | ICD-10-CM | POA: Diagnosis present

## 2013-07-05 DIAGNOSIS — D638 Anemia in other chronic diseases classified elsewhere: Secondary | ICD-10-CM | POA: Diagnosis present

## 2013-07-05 DIAGNOSIS — I4891 Unspecified atrial fibrillation: Secondary | ICD-10-CM | POA: Diagnosis present

## 2013-07-05 DIAGNOSIS — Z90710 Acquired absence of both cervix and uterus: Secondary | ICD-10-CM

## 2013-07-05 DIAGNOSIS — I5041 Acute combined systolic (congestive) and diastolic (congestive) heart failure: Secondary | ICD-10-CM

## 2013-07-05 LAB — CBC
HCT: 23.5 % — ABNORMAL LOW (ref 36.0–46.0)
Hemoglobin: 7 g/dL — ABNORMAL LOW (ref 12.0–15.0)
RBC: 3.26 MIL/uL — ABNORMAL LOW (ref 3.87–5.11)
WBC: 7.8 10*3/uL (ref 4.0–10.5)

## 2013-07-05 LAB — BASIC METABOLIC PANEL
BUN: 44 mg/dL — ABNORMAL HIGH (ref 6–23)
CO2: 22 mEq/L (ref 19–32)
Chloride: 107 mEq/L (ref 96–112)
Glucose, Bld: 80 mg/dL (ref 70–99)
Potassium: 4.6 mEq/L (ref 3.5–5.1)

## 2013-07-05 LAB — PROTIME-INR: Prothrombin Time: 22.4 seconds — ABNORMAL HIGH (ref 11.6–15.2)

## 2013-07-05 MED ORDER — WARFARIN SODIUM 10 MG PO TABS
10.0000 mg | ORAL_TABLET | Freq: Every day | ORAL | Status: DC
  Administered 2013-07-05 – 2013-07-06 (×2): 10 mg via ORAL
  Filled 2013-07-05 (×3): qty 1

## 2013-07-05 MED ORDER — SODIUM CHLORIDE 0.9 % IV SOLN: 250.0000 mL | INTRAVENOUS | Status: DC | PRN

## 2013-07-05 MED ORDER — SODIUM CHLORIDE 0.9 % IJ SOLN: 3.0000 mL | INTRAMUSCULAR | Status: DC | PRN

## 2013-07-05 MED ORDER — OXYCODONE-ACETAMINOPHEN 5-325 MG PO TABS: 1.0000 | ORAL_TABLET | Freq: Three times a day (TID) | ORAL | Status: DC | PRN

## 2013-07-05 MED ORDER — ACETAMINOPHEN 650 MG RE SUPP: 650.0000 mg | Freq: Four times a day (QID) | RECTAL | Status: DC | PRN

## 2013-07-05 MED ORDER — ATORVASTATIN CALCIUM 20 MG PO TABS
20.0000 mg | ORAL_TABLET | Freq: Every day | ORAL | Status: DC
  Administered 2013-07-05 – 2013-07-06 (×2): 20 mg via ORAL
  Filled 2013-07-05 (×3): qty 1

## 2013-07-05 MED ORDER — SODIUM CHLORIDE 0.9 % IJ SOLN
3.0000 mL | Freq: Two times a day (BID) | INTRAMUSCULAR | Status: DC
  Administered 2013-07-05 – 2013-07-07 (×4): 3 mL via INTRAVENOUS

## 2013-07-05 MED ORDER — WARFARIN - PHYSICIAN DOSING INPATIENT: Freq: Every day | Status: DC

## 2013-07-05 MED ORDER — LEVOFLOXACIN 250 MG PO TABS
250.0000 mg | ORAL_TABLET | Freq: Every day | ORAL | Status: DC
  Administered 2013-07-06 – 2013-07-07 (×2): 250 mg via ORAL
  Filled 2013-07-05 (×2): qty 1

## 2013-07-05 MED ORDER — INSULIN GLARGINE 100 UNIT/ML ~~LOC~~ SOLN
20.0000 [IU] | Freq: Two times a day (BID) | SUBCUTANEOUS | Status: DC
  Administered 2013-07-05 – 2013-07-07 (×4): 20 [IU] via SUBCUTANEOUS
  Filled 2013-07-05 (×5): qty 0.2

## 2013-07-05 MED ORDER — AMLODIPINE BESYLATE 10 MG PO TABS
10.0000 mg | ORAL_TABLET | Freq: Every day | ORAL | Status: DC
  Administered 2013-07-06 – 2013-07-07 (×2): 10 mg via ORAL
  Filled 2013-07-05 (×2): qty 1

## 2013-07-05 MED ORDER — DOCUSATE SODIUM 100 MG PO CAPS
100.0000 mg | ORAL_CAPSULE | Freq: Two times a day (BID) | ORAL | Status: DC
  Administered 2013-07-05 – 2013-07-07 (×4): 100 mg via ORAL
  Filled 2013-07-05 (×5): qty 1

## 2013-07-05 MED ORDER — WARFARIN SODIUM 10 MG PO TABS
10.0000 mg | ORAL_TABLET | Freq: Every day | ORAL | Status: DC
  Filled 2013-07-05: qty 1

## 2013-07-05 MED ORDER — ACETAMINOPHEN 325 MG PO TABS: 650.0000 mg | ORAL_TABLET | Freq: Four times a day (QID) | ORAL | Status: DC | PRN

## 2013-07-05 MED ORDER — ALLOPURINOL 100 MG PO TABS
100.0000 mg | ORAL_TABLET | Freq: Every day | ORAL | Status: DC
  Administered 2013-07-06 – 2013-07-07 (×2): 100 mg via ORAL
  Filled 2013-07-05 (×2): qty 1

## 2013-07-05 MED ORDER — ALBUTEROL SULFATE HFA 108 (90 BASE) MCG/ACT IN AERS
2.0000 | INHALATION_SPRAY | Freq: Four times a day (QID) | RESPIRATORY_TRACT | Status: DC
  Administered 2013-07-06 (×2): 2 via RESPIRATORY_TRACT
  Filled 2013-07-05: qty 6.7

## 2013-07-05 MED ORDER — FUROSEMIDE 10 MG/ML IJ SOLN
40.0000 mg | Freq: Once | INTRAMUSCULAR | Status: AC
  Administered 2013-07-05: 40 mg via INTRAVENOUS
  Filled 2013-07-05: qty 4

## 2013-07-05 MED ORDER — SENNA 8.6 MG PO TABS
1.0000 | ORAL_TABLET | Freq: Two times a day (BID) | ORAL | Status: DC
  Administered 2013-07-05 – 2013-07-07 (×4): 8.6 mg via ORAL
  Filled 2013-07-05 (×5): qty 1

## 2013-07-05 MED ORDER — FERROUS SULFATE 325 (65 FE) MG PO TABS
325.0000 mg | ORAL_TABLET | Freq: Three times a day (TID) | ORAL | Status: DC
  Administered 2013-07-06 – 2013-07-07 (×5): 325 mg via ORAL
  Filled 2013-07-05 (×7): qty 1

## 2013-07-05 MED ORDER — ZOLPIDEM TARTRATE 5 MG PO TABS: 5.0000 mg | ORAL_TABLET | Freq: Every evening | ORAL | Status: DC | PRN

## 2013-07-05 MED ORDER — HYDRALAZINE HCL 50 MG PO TABS
50.0000 mg | ORAL_TABLET | Freq: Three times a day (TID) | ORAL | Status: DC
  Administered 2013-07-05 – 2013-07-07 (×5): 50 mg via ORAL
  Filled 2013-07-05 (×7): qty 1

## 2013-07-05 MED ORDER — PANTOPRAZOLE SODIUM 40 MG PO TBEC: 40.0000 mg | DELAYED_RELEASE_TABLET | Freq: Every day | ORAL | Status: DC

## 2013-07-05 MED ORDER — FUROSEMIDE 40 MG PO TABS
40.0000 mg | ORAL_TABLET | Freq: Two times a day (BID) | ORAL | Status: DC
  Administered 2013-07-06 – 2013-07-07 (×2): 40 mg via ORAL
  Filled 2013-07-05 (×6): qty 1

## 2013-07-05 MED ORDER — SPIRONOLACTONE 25 MG PO TABS
25.0000 mg | ORAL_TABLET | Freq: Two times a day (BID) | ORAL | Status: DC
  Administered 2013-07-05 – 2013-07-07 (×4): 25 mg via ORAL
  Filled 2013-07-05 (×5): qty 1

## 2013-07-05 MED ORDER — AMIODARONE HCL 200 MG PO TABS
200.0000 mg | ORAL_TABLET | Freq: Every day | ORAL | Status: DC
  Administered 2013-07-06 – 2013-07-07 (×2): 200 mg via ORAL
  Filled 2013-07-05 (×2): qty 1

## 2013-07-05 NOTE — H&P (Addendum)
[facility] History and Physical  [name] ZOX:[id number] DOB: 10/22/1951 DOA: 07/05/2013  Referring physician: EDP PCP: Willey Blade, MD    Chief Complaint: anemia  HPI: [name] is a 62 y.o. female sent to the ED for anemia.  Hgb checked as outpatient. In ED, hgb is 7. Has h/o anemia of chronic disease. Also CKD. C/o weakness, dyspnea. No bleeding. CXR shows no pulmonary edema.  proBNP 1000, which is lower than previous. 1 unit PRBC ordered to be transfused in the ED  Review of Systems: systems reviewed. As above, otherwise negative.  Past Medical History  Diagnosis Date  . Asthma   . Bronchitis   . Allergic rhinitis   . Diabetes mellitus   . HTN (hypertension)   . DJD (degenerative joint disease)   . Hyperlipidemia   . Complication of anesthesia     trouble waking up  . Obesity   . OSA (obstructive sleep apnea)     poor compliance with cpap  . CHF (congestive heart failure)   . Biventricular failure     compensated  . Psoriasis   . Stasis edema     of lower extremities  . Depression   . Situational stress   . Stroke   . Kidney stones   . Kidney disease, chronic, stage III (GFR 30-59 ml/min)   . Anemia   . Gout   . Chronic anticoagulation   . Yeast infection involving the vagina and surrounding area   . Atrial fibrillation with RVR    Past Surgical History  Procedure Laterality Date  . Appendectomy    . Cholecystectomy    . Total knee arthroplasty    . Total abdominal hysterectomy    . Back surgery    . Cystectomy      left hand   Social History:  reports that she has quit smoking. Her smoking use included Cigarettes. She smoked 0.00 packs per day. She has never used smokeless tobacco. She reports that she does not drink alcohol or use illicit drugs.Married walks with walker  Allergies  Allergen Reactions  . Daptomycin Other (See Comments)    Elevated CPK  . Morphine And Related Nausea And Vomiting  . Cephalexin Rash    unknown  .  Celecoxib     unknown  . Codeine     unknown  . Fluoxetine Hcl     unknown  . Latex     REACTION: undefined  . Ofloxacin     unknown  . Penicillins     unknown  . Rofecoxib     unknown  . Sulfonamide Derivatives     unknown    Family History  Problem Relation Age of Onset  . Heart attack Father   . Asthma Father   . Heart disease Paternal Uncle   . Rectal cancer Paternal Aunt   . Other Mother     mva   Prior to Admission medications   Medication Sig Start Date End Date Taking? Authorizing Provider  albuterol (PROVENTIL HFA;VENTOLIN HFA) 108 (90 BASE) MCG/ACT inhaler Inhale 2 puffs into the lungs every 6 (six) hours as needed for wheezing or shortness of breath.    Yes Historical Provider, MD  allopurinol (ZYLOPRIM) 100 MG tablet Take 100 mg by mouth daily.  09/16/11  Yes Historical Provider, MD  amiodarone (PACERONE) 200 MG tablet Take 200 mg by mouth daily.   Yes Historical Provider, MD  amLODipine (NORVASC) 10 MG tablet Take 10 mg by mouth daily.  Yes Historical Provider, MD  atorvastatin (LIPITOR) 20 MG tablet Take 20 mg by mouth at bedtime.   Yes Historical Provider, MD  ferrous sulfate 325 (65 FE) MG tablet Take 325 mg by mouth 3 (three) times daily with meals. 08/29/12  Yes Gwenyth Bender, MD  furosemide (LASIX) 40 MG tablet Take 40 mg by mouth 2 (two) times daily.   Yes Historical Provider, MD  hydrALAZINE (APRESOLINE) 50 MG tablet Take 50 mg by mouth 3 (three) times daily.   Yes Historical Provider, MD  insulin aspart (NOVOLOG) 100 UNIT/ML injection Inject 0-20 Units into the skin 3 (three) times daily before meals. Per sliding scale   Yes Historical Provider, MD  insulin glargine (LANTUS) 100 UNIT/ML injection Inject 20 Units into the skin 2 (two) times daily.   Yes Historical Provider, MD  levofloxacin (LEVAQUIN) 250 MG tablet Take 250 mg by mouth daily.   Yes Historical Provider, MD  oxyCODONE-acetaminophen (PERCOCET/ROXICET) 5-325 MG per tablet Take 1 tablet by mouth  every 8 (eight) hours as needed for pain.   Yes Historical Provider, MD  pantoprazole (PROTONIX) 40 MG tablet Take 40 mg by mouth daily.   Yes Historical Provider, MD  spironolactone (ALDACTONE) 25 MG tablet Take 25 mg by mouth 2 (two) times daily.    Yes Historical Provider, MD  warfarin (COUMADIN) 5 MG tablet Take 10 mg by mouth daily.   Yes Historical Provider, MD  zolpidem (AMBIEN) 5 MG tablet Take 5 mg by mouth at bedtime as needed for sleep.    Yes Historical Provider, MD   Physical Exam: Filed Vitals:   07/05/13 2130 07/05/13 2141 07/05/13 2200 07/05/13 2212  BP: 166/54 172/54 174/59 174/75  Pulse: 80 78 74 78  Temp:  98.1 F (36.7 C)    TempSrc:      Resp: 28 23 22 22   Weight:      SpO2:   97% 99%   BP 174/75  Pulse 78  Temp(Src) 98.1 F (36.7 C) (Oral)  Resp 22  Wt 119.75 kg (264 lb)  BMI 46.78 kg/m2  SpO2 99%  General Appearance:    Alert, cooperative, no distress, appears stated age. obese  Head:    Normocephalic, without obvious abnormality, atraumatic  Eyes:    PERRL, pale conjunctiva, EOM's intact, fundi    benign, both eyes     Nose:   Nares normal, septum midline, mucosa normal, no drainage    or sinus tenderness  Throat:   Lips, mucosa, and tongue normal; teeth and gums normal  Neck:   Thick.  Supple. No carotid bruit.  Back:     Symmetric, no curvature, ROM normal, no CVA tenderness  Lungs:     Clear to auscultation bilaterally, respirations unlabored  Chest Wall:    No tenderness or deformity   Heart:    Regular rate and rhythm, S1 and S2 normal, no murmur, rub   or gallop     Abdomen:     Soft, non-tender, bowel sounds active  Genitalia:    deferred  Rectal:    deferred  Extremities:   Extremities normal, atraumatic, no cyanosis or edema  Pulses:   2+ and symmetric all extremities  Skin:   Multiple psoriasis plaques.  Lymph nodes:   Cervical, supraclavicular, and axillary nodes normal  Neurologic:   CNII-XII intact, normal strength, sensation and  reflexes    throughout    Psych: normal affect  Labs on Admission:  Basic Metabolic Panel:  Recent Labs Lab  07/01/13 1108 07/05/13 1536  NA 142 140  K 4.8 4.6  CL 111 107  CO2 20 22  GLUCOSE 83 80  BUN 37* 44*  CREATININE 2.65* 2.59*  CALCIUM 8.4 8.9   Liver Function Tests: No results found for this basename: AST, ALT, ALKPHOS, BILITOT, PROT, ALBUMIN,  in the last 168 hours No results found for this basename: LIPASE, AMYLASE,  in the last 168 hours No results found for this basename: AMMONIA,  in the last 168 hours CBC:  Recent Labs Lab 07/01/13 1108 07/05/13 1536  WBC 7.5 7.8  NEUTROABS 6.2  --   HGB 6.7* 7.0*  HCT 22.4* 23.5*  MCV 69.6* 72.1*  PLT 336 344   Cardiac Enzymes: No results found for this basename: CKTOTAL, CKMB, CKMBINDEX, TROPONINI,  in the last 168 hours  BNP (last 3 results)  Recent Labs  02/13/13 0345 02/15/13 0434 07/05/13 1536  PROBNP 698.5* 438.6* 1581.0*   CBG: No results found for this basename: GLUCAP,  in the last 168 hours  Radiological Exams on Admission: Dg Chest 2 View  07/05/2013   *RADIOLOGY REPORT*  Clinical Data: Shortness of breath  CHEST - 2 VIEW  Comparison: February 11, 2013  Findings: There is no edema or consolidation.  Heart is mildly enlarged with normal pulmonary vascularity.  No adenopathy.  There is degenerative change in the thoracic spine.  IMPRESSION: No edema or consolidation.  Cardiomegaly, stable.   Original Report Authenticated By: Bretta Bang, M.D.    Assessment/Plan Active Problems:   Anemia of chronic disease: transfuse 1 unit. Observation. D/c in am if stable. Repeat labs   HYPERTENSION   HYPERLIPIDEMIA   OBSTRUCTIVE SLEEP APNEA, noncompliang with cpap   DM (diabetes mellitus)   Chronic anticoagulation   Chronic venous insufficiency   CKD (chronic kidney disease), stage IV    PAF (paroxysmal atrial fibrillation)   Psoriasis   Morbid obesity   Chronic combined systolic and diastolic CHF  (congestive heart failure): no evidence of acute CHF  Code Status: full Family Communication: none Disposition Plan: home  Time spent: 50 minutes  Jakorey Mcconathy L [facility] Pager [phone]  If 7PM-7AM, please contact night-coverage [email or web address] Password [facility] 07/05/2013, 10:34 PM

## 2013-07-05 NOTE — ED Provider Notes (Signed)
Medical screening examination/treatment/procedure(s) were performed by non-physician practitioner and as supervising physician I was immediately available for consultation/collaboration.   Charles B. Bernette Mayers, MD 07/05/13 1759

## 2013-07-05 NOTE — ED Notes (Signed)
MD at bedside. 

## 2013-07-05 NOTE — ED Notes (Signed)
Pt in stating she was told to come in due to having low hemaglobin, states she had blood worked drawn four days ago and was called today and told to come in to the ED, pt also states she has been more short of breath over the last few days, history of CHF but states she saw her MD for that a few days ago and they stated everything looked ok. Pt speaking in short sentences during triage and states this has been going on for the last few days.

## 2013-07-05 NOTE — ED Notes (Signed)
Contacted bed control regarding delay, pt updated.

## 2013-07-05 NOTE — ED Notes (Signed)
House Coverage contacted regarding delay. Pt updated.

## 2013-07-05 NOTE — ED Provider Notes (Signed)
CSN: [id number]     Arrival date & time 07/05/13  1514 History     First MD Initiated Contact with Patient 07/05/13 1613     Chief Complaint  Patient presents with  . Abnormal Lab  . Shortness of Breath   (Consider location/radiation/quality/duration/timing/severity/associated sxs/prior Treatment) HPI  Vi L Oboyle is a 62 y.o.female with a significant PMH of asthma, diabetes, hypertension, PCP, psoriasis, CHF, stroke, chronic kidney disease, anemia of chronic disease, chronic anti-coagulation, it A. fib with RVR presents to the ER with complaints of low hemoglobin. Patient evaluation by PCP Dr. Algis Liming and was called today and told that her hemoglobin was low and that she needed to come to the emergency department for blood transfusion. The patient has been short of breath at home and mildly weak. She says she has been able to get up and do her activities of daily living even though having some fatigue. She has a history of CHF and asthma but denies having any cold symptoms, wheezing, fevers, nausea, vomiting, diarrhea. The patient says that she gets anemic but they do not know why. She does not have any symptoms of GI bleed. Denies having blood in her urine.    Past Medical History  Diagnosis Date  . Asthma   . Bronchitis   . Allergic rhinitis   . Diabetes mellitus   . HTN (hypertension)   . DJD (degenerative joint disease)   . Hyperlipidemia   . Complication of anesthesia     trouble waking up  . Obesity   . OSA (obstructive sleep apnea)     poor compliance with cpap  . CHF (congestive heart failure)   . Biventricular failure     compensated  . Psoriasis   . Stasis edema     of lower extremities  . Depression   . Situational stress   . Stroke   . Kidney stones   . Kidney disease, chronic, stage III (GFR 30-59 ml/min)   . Anemia   . Gout   . Chronic anticoagulation   . Yeast infection involving the vagina and surrounding area   . Atrial fibrillation with RVR     Past Surgical History  Procedure Laterality Date  . Appendectomy    . Cholecystectomy    . Total knee arthroplasty    . Total abdominal hysterectomy    . Back surgery    . Cystectomy      left hand   Family History  Problem Relation Age of Onset  . Heart attack Father   . Asthma Father   . Heart disease Paternal Uncle   . Rectal cancer Paternal Aunt   . Other Mother     mva   History  Substance Use Topics  . Smoking status: Former Smoker    Types: Cigarettes  . Smokeless tobacco: Never Used     Comment: smoked for 1.5 yrs about 2 cigs a day ,only if stressed  . Alcohol Use: No   OB History   Grav Para Term Preterm Abortions TAB SAB Ect Mult Living                 Review of Systems  Constitutional: Positive for fatigue.  Respiratory: Positive for shortness of breath.   All other systems reviewed and are negative.    Allergies  Daptomycin; Morphine and related; Cephalexin; Celecoxib; Codeine; Fluoxetine hcl; Latex; Ofloxacin; Penicillins; Rofecoxib; and Sulfonamide derivatives  Home Medications   Current Outpatient Rx  Name  Route  Sig  Dispense  Refill  . albuterol (PROVENTIL HFA;VENTOLIN HFA) 108 (90 BASE) MCG/ACT inhaler   Inhalation   Inhale 2 puffs into the lungs every 6 (six) hours as needed for wheezing or shortness of breath.          . allopurinol (ZYLOPRIM) 100 MG tablet   Oral   Take 100 mg by mouth daily.          [name] amiodarone (PACERONE) 200 MG tablet   Oral   Take 200 mg by mouth daily.         [name] amLODipine (NORVASC) 10 MG tablet   Oral   Take 10 mg by mouth daily.         [name] atorvastatin (LIPITOR) 20 MG tablet   Oral   Take 20 mg by mouth at bedtime.         . ferrous sulfate 325 (65 FE) MG tablet   Oral   Take 325 mg by mouth 3 (three) times daily with meals.         . furosemide (LASIX) 40 MG tablet   Oral   Take 40 mg by mouth 2 (two) times daily.         . hydrALAZINE (APRESOLINE) 50 MG tablet   Oral   Take  50 mg by mouth 3 (three) times daily.         . insulin aspart (NOVOLOG) 100 UNIT/ML injection   Subcutaneous   Inject 0-20 Units into the skin 3 (three) times daily before meals. Per sliding scale         . insulin glargine (LANTUS) 100 UNIT/ML injection   Subcutaneous   Inject 20 Units into the skin 2 (two) times daily.         [name] levofloxacin (LEVAQUIN) 250 MG tablet   Oral   Take 250 mg by mouth daily.         [name] oxyCODONE-acetaminophen (PERCOCET/ROXICET) 5-325 MG per tablet   Oral   Take 1 tablet by mouth every 8 (eight) hours as needed for pain.         . pantoprazole (PROTONIX) 40 MG tablet   Oral   Take 40 mg by mouth daily.         [name] spironolactone (ALDACTONE) 25 MG tablet   Oral   Take 25 mg by mouth 2 (two) times daily.          [name] warfarin (COUMADIN) 5 MG tablet   Oral   Take 10 mg by mouth daily.         [name] zolpidem (AMBIEN) 5 MG tablet   Oral   Take 5 mg by mouth at bedtime as needed for sleep.           BP 169/63  Pulse 78  Temp(Src) 98.2 F (36.8 C) (Oral)  Resp 20  Wt 264 lb (119.75 kg)  BMI 46.78 kg/m2  SpO2 100% Physical Exam  Nursing note and vitals reviewed. Constitutional: She is oriented to person, place, and time. She appears well-developed and well-nourished. No distress.  HENT:  Head: Normocephalic and atraumatic.  Eyes: Pupils are equal, round, and reactive to light.  Neck: Normal range of motion. Neck supple.  Cardiovascular: Normal rate and regular rhythm.   Pulmonary/Chest: Effort normal.  Short of breath, speaking in short sentences   Abdominal: Soft.  Neurological: She is alert and oriented to person, place, and time. She has normal strength. No cranial nerve deficit or sensory deficit. She displays a negative Romberg  sign.  Skin: Skin is warm and dry.    ED Course   Procedures (including critical care time)  Labs Reviewed  CBC - Abnormal; Notable for the following:    RBC 3.26 (*)    Hemoglobin 7.0 (*)     HCT 23.5 (*)    MCV 72.1 (*)    MCH 21.5 (*)    MCHC 29.8 (*)    RDW 19.4 (*)    All other components within normal limits  BASIC METABOLIC PANEL - Abnormal; Notable for the following:    BUN 44 (*)    Creatinine, Ser 2.59 (*)    GFR calc non Af Amer 19 (*)    GFR calc Af Amer 22 (*)    All other components within normal limits  PROTIME-INR - Abnormal; Notable for the following:    Prothrombin Time 22.4 (*)    INR 2.04 (*)    All other components within normal limits  APTT - Abnormal; Notable for the following:    aPTT 59 (*)    All other components within normal limits  TYPE AND SCREEN  PREPARE RBC (CROSSMATCH)   Dg Chest 2 View  07/05/2013   *RADIOLOGY REPORT*  Clinical Data: Shortness of breath  CHEST - 2 VIEW  Comparison: February 11, 2013  Findings: There is no edema or consolidation.  Heart is mildly enlarged with normal pulmonary vascularity.  No adenopathy.  There is degenerative change in the thoracic spine.  IMPRESSION: No edema or consolidation.  Cardiomegaly, stable.   Original Report Authenticated By: Bretta Bang, M.D.   1. Anemia requiring transfusions     MDM  CRITICAL CARE Performed by: Dorthula Matas Total critical care time: 30 Critical care time was exclusive of separately billable procedures and treating other patients. Critical care was necessary to treat or prevent imminent or life-threatening deterioration. Critical care was time spent personally by me on the following activities: development of treatment plan with patient and/or surrogate as well as nursing, discussions with consultants, evaluation of patient's response to treatment, examination of patient, obtaining history from patient or surrogate, ordering and performing treatments and interventions, ordering and review of laboratory studies, ordering and review of radiographic studies, pulse oximetry and re-evaluation of patient's condition.   Pt has anemia of chronic disease. No complaints of  bleeding from urine or GI system.  1 Unit of RBCs ordered to be transfused. As patient is symptomatic will admit.  Obs, [facility], team 10. tele  Dorthula Matas, PA-C 07/05/13 1758

## 2013-07-05 NOTE — ED Provider Notes (Signed)
Date: 07/05/2013  Rate: 80  Rhythm:  sinus rhythm  QRS Axis: normal  Intervals: normal  ST/T Wave abnormalities: borderline T wave abnormality  Conduction Disutrbances:none  Narrative Interpretation:   Old EKG Reviewed: unchanged      Dorthula Matas, PA-C 07/05/13 1822

## 2013-07-06 LAB — CBC
MCV: 73.3 fL — ABNORMAL LOW (ref 78.0–100.0)
Platelets: 311 10*3/uL (ref 150–400)
RBC: 3.29 MIL/uL — ABNORMAL LOW (ref 3.87–5.11)
RDW: 19.7 % — ABNORMAL HIGH (ref 11.5–15.5)
WBC: 7 10*3/uL (ref 4.0–10.5)

## 2013-07-06 LAB — COMPREHENSIVE METABOLIC PANEL
ALT: 8 U/L (ref 0–35)
Albumin: 2.6 g/dL — ABNORMAL LOW (ref 3.5–5.2)
Alkaline Phosphatase: 101 U/L (ref 39–117)
Chloride: 108 mEq/L (ref 96–112)
GFR calc Af Amer: 24 mL/min — ABNORMAL LOW (ref >90)
Glucose, Bld: 103 mg/dL — ABNORMAL HIGH (ref 70–99)
Potassium: 4.4 mEq/L (ref 3.5–5.1)
Sodium: 141 mEq/L (ref 135–145)
Total Protein: 7.4 g/dL (ref 6.0–8.3)

## 2013-07-06 LAB — URINALYSIS, ROUTINE W REFLEX MICROSCOPIC
Glucose, UA: NEGATIVE mg/dL
Hgb urine dipstick: NEGATIVE
Specific Gravity, Urine: 1.008 (ref 1.005–1.030)

## 2013-07-06 LAB — URINE MICROSCOPIC-ADD ON

## 2013-07-06 LAB — GLUCOSE, CAPILLARY: Glucose-Capillary: 117 mg/dL — ABNORMAL HIGH (ref 70–99)

## 2013-07-06 LAB — IRON AND TIBC
Iron: 46 ug/dL (ref 42–135)
TIBC: 254 ug/dL (ref 250–470)

## 2013-07-06 LAB — TROPONIN I: Troponin I: <0.3 ng/mL (ref <0.30)

## 2013-07-06 LAB — FOLATE: Folate: 11.4 ng/mL

## 2013-07-06 LAB — MRSA PCR SCREENING: MRSA by PCR: POSITIVE — AB

## 2013-07-06 LAB — TSH: TSH: 1.55 u[IU]/mL (ref 0.350–4.500)

## 2013-07-06 LAB — RETICULOCYTES: Retic Ct Pct: 1.6 % (ref 0.4–3.1)

## 2013-07-06 LAB — PROTIME-INR
INR: 1.92 — ABNORMAL HIGH (ref 0.00–1.49)
Prothrombin Time: 21.4 seconds — ABNORMAL HIGH (ref 11.6–15.2)

## 2013-07-06 LAB — VITAMIN B12: Vitamin B-12: 202 pg/mL — ABNORMAL LOW (ref 211–911)

## 2013-07-06 MED ORDER — PANTOPRAZOLE SODIUM 40 MG IV SOLR
40.0000 mg | Freq: Two times a day (BID) | INTRAVENOUS | Status: DC
  Administered 2013-07-06 – 2013-07-07 (×3): 40 mg via INTRAVENOUS
  Filled 2013-07-06 (×4): qty 40

## 2013-07-06 MED ORDER — ALBUTEROL SULFATE (5 MG/ML) 0.5% IN NEBU
2.5000 mg | INHALATION_SOLUTION | Freq: Four times a day (QID) | RESPIRATORY_TRACT | Status: DC | PRN
Start: 2013-07-06 — End: 2013-07-07

## 2013-07-06 MED ORDER — ALBUTEROL SULFATE HFA 108 (90 BASE) MCG/ACT IN AERS
2.0000 | INHALATION_SPRAY | Freq: Three times a day (TID) | RESPIRATORY_TRACT | Status: DC
  Administered 2013-07-06 – 2013-07-07 (×3): 2 via RESPIRATORY_TRACT
  Filled 2013-07-06: qty 6.7

## 2013-07-06 MED ORDER — CHLORHEXIDINE GLUCONATE CLOTH 2 % EX PADS
6.0000 | MEDICATED_PAD | Freq: Every day | CUTANEOUS | Status: DC
  Administered 2013-07-06 – 2013-07-07 (×2): 6 via TOPICAL

## 2013-07-06 MED ORDER — MUPIROCIN 2 % EX OINT
1.0000 "application " | TOPICAL_OINTMENT | Freq: Two times a day (BID) | CUTANEOUS | Status: DC
  Administered 2013-07-06 – 2013-07-07 (×3): 1 via NASAL
  Filled 2013-07-06: qty 22

## 2013-07-06 MED ORDER — FUROSEMIDE 10 MG/ML IJ SOLN
40.0000 mg | Freq: Once | INTRAMUSCULAR | Status: AC
  Administered 2013-07-06: 40 mg via INTRAVENOUS

## 2013-07-06 NOTE — Progress Notes (Signed)
Pt called out wanted Cheree Ditto crackers says she's hungry. Since pt is diabetic asked if she could wait until lunch at 1200. Pt ate 100 % breakfast at 0830 this am.

## 2013-07-06 NOTE — Evaluation (Signed)
Occupational Therapy Evaluation Patient Details Name: [name] MRN: [id number] DOB: [birth year] Today's Date: 07/06/2013 Time: 4098-1191 OT Time Calculation (min): 40 min  OT Assessment / Plan / Recommendation History of present illness  62 y.o. female sent to the ED for anemia.    Clinical Impression   Pt's husband present during eval and patient often became angry and agitated with him. Patient hasn't slept in the bed for some time and instead has been sleeping in a lift chair due to gets up almost every hour to urinate and disturbs husband to sometimes assist her back to bed.  Pt dependent for pericare post urinate and BM when seated on 3in1 BSC.  Pt with c/o L LE pain limiting functional mobility at this time. Spouse reports avail to assist 24/7 except when attending meeting in AM at which time "handicapped son can provide assistance if needed". Pt to benefit from CIR consideration to improved strength, endurance, BADL and bathroom transfers to prepare for discharge home.    OT Assessment  Patient needs continued OT Services    Follow Up Recommendations  Home health OT;CIR    Equipment Recommendations   (patient declines shower seat)    Frequency  Min 2X/week    Precautions / Restrictions Precautions Precautions: Fall Precaution Comments: obese Restrictions Weight Bearing Restrictions: No   Pertinent Vitals/Pain Reports LLE pain, "tolerable", not rated, provided rest and repositioned    ADL  Grooming: Simulated;Set up Where Assessed - Grooming: Unsupported sitting Upper Body Bathing: Simulated;Set up Where Assessed - Upper Body Bathing: Unsupported sitting Lower Body Bathing: Simulated;Maximal assistance (reports she cleans peri-anal area seated on commode) Where Assessed - Lower Body Bathing: Unsupported sitting;Supported standing Upper Body Dressing: Simulated;Set up Where Assessed - Upper Body Dressing: Unsupported sitting Lower Body Dressing: Simulated;Maximal  assistance Where Assessed - Lower Body Dressing: Supported standing;Unsupported sitting;Supported sit to stand Toilet Transfer: Performed;Min guard Statistician Method: Sit to stand;Stand pivot Acupuncturist: Materials engineer and Hygiene: Performed;+1 Total assistance Where Assessed - Toileting Clothing Manipulation and Hygiene: Standing ADL Comments: Patient reports that the [facility] handles do not allow her to open her legs wide to access herself for hygiene and bath and the BSC hole is small.  "At home I can do this myself".    OT Diagnosis: Generalized weakness;Acute pain  OT Problem List: Decreased activity tolerance;Impaired balance (sitting and/or standing);Cardiopulmonary status limiting activity;Obesity;Pain;Decreased knowledge of use of DME or AE OT Treatment Interventions: Self-care/ADL training;Energy conservation;Therapeutic exercise;DME and/or AE instruction;Patient/family education;Therapeutic activities   OT Goals(Current goals can be found in the care plan section) Acute Rehab OT Goals Patient Stated Goal: get better to go home OT Goal Formulation: With patient/family Time For Goal Achievement: 07/20/13 Potential to Achieve Goals: Good  Visit Information  Last OT Received On: 07/06/13 Assistance Needed: +1 History of Present Illness:  62 y.o. female sent to the ED for anemia.        Prior Functioning     Home Living Family/patient expects to be discharged to:: Private residence Living Arrangements: Spouse/significant other Available Help at Discharge: Family;Available 24 hours/day (via spouse and "handicapped son") Type of Home: House Home Access: Ramped entrance;Stairs to enter (ramp in back, 2 steps in front) Entrance Stairs-Number of Steps: 2 Entrance Stairs-Rails: Can reach both Home Layout: One level Home Equipment: Walker - 2 wheels Additional Comments: pt on 2LO2 at home, reports that no room in the house for a  3in1 and family c/o shower seat in  shower so it was removed. Prior Function Level of Independence: Independent with assistive device(s);Needs assistance Gait / Transfers Assistance Needed: Transfers: get into bed when LLE hurt or stiff ADL's / Homemaking Assistance Needed: BADL:  occasionally needs assistance with don underware and/or pants, unable to don socks Comments: uses electric cart for grocery shopping Communication Communication: No difficulties Dominant Hand: Left    Cognition  Cognition Arousal/Alertness: Awake/alert Behavior During Therapy: WFL for tasks assessed/performed;Agitated (often became agitated/angry with husband during eval) Overall Cognitive Status: Within Functional Limits for tasks assessed    Extremity/Trunk Assessment Upper Extremity Assessment Upper Extremity Assessment: Overall WFL for tasks assessed Lower Extremity Assessment Lower Extremity Assessment: Defer to PT evaluation LLE Deficits / Details: PT reports grossly 3-/5 Cervical / Trunk Assessment Cervical / Trunk Assessment: Normal     Mobility Transfers Sit to Stand: With upper extremity assist;4: Min guard;From chair/3-in-1 Stand to Sit: 4: Min guard;Without upper extremity assist;To chair/3-in-1     End of Session OT - End of Session Equipment Utilized During Treatment: Rolling walker Activity Tolerance: Patient tolerated treatment well Patient left: in chair;with family/visitor present  GO     [name] 07/06/2013, 4:10 PM

## 2013-07-06 NOTE — Progress Notes (Signed)
Set pt.'s CPAP up at the bedside on auto titrate (Min: 5, Max: 15) via nasal mask with 2L O2 bled in. Pt. Is eating now but stated that she would put CPAP on before going to bed. Pt. Was made aware to let RT / RN know if she needed assistance.

## 2013-07-06 NOTE — Progress Notes (Signed)
Pt was sitting up in bed when I arrived. Pt said she arrived last evening and gave me an update of her condition. Pt had requested visit. Had prayer w/pt and provided empathic listening and presence of compassion. Marjory Lies Chaplain  07/06/13 1600  Clinical Encounter Type  Visited With Patient

## 2013-07-06 NOTE — Evaluation (Signed)
Physical Therapy Evaluation Patient Details Name: [name] MRN: [id number] DOB: 1951-01-28 Today's Date: 07/06/2013 Time: 1110-1141 PT Time Calculation (min): 31 min  PT Assessment / Plan / Recommendation History of Present Illness   62 y.o. female sent to the ED for anemia.   Clinical Impression  Pt with limited OOB mobility tolerance this date however suspect this is near baseline. Pt dependent for pericare post urinating. Pt with c/o L LE pain limiting ambulation at this time. Pt reports spouse avail to assist 24/7. Pt to benefit from HHPT to improved strength and endurance as well as advance mobility.    PT Assessment  Patient needs continued PT services    Follow Up Recommendations  Home health PT;Supervision/Assistance - 24 hour    Does the patient have the potential to tolerate intense rehabilitation      Barriers to Discharge        Equipment Recommendations  None recommended by PT    Recommendations for Other Services     Frequency Min 3X/week    Precautions / Restrictions Precautions Precautions: Fall Restrictions Weight Bearing Restrictions: No   Pertinent Vitals/Pain Pt did not rate but c/o L LE pain      Mobility  Bed Mobility Bed Mobility: Rolling Left;Left Sidelying to Sit Rolling Left: 6: Modified independent (Device/Increase time);With rail Left Sidelying to Sit: 3: Mod assist;With rails;HOB flat Details for Bed Mobility Assistance: assit for trunk elevation (pt pulled up on PT) Transfers Transfers: Sit to Stand;Stand to Sit;Stand Pivot Transfers Sit to Stand: 4: Min assist;With upper extremity assist;From bed Stand to Sit: 4: Min assist;With upper extremity assist;To chair/3-in-1 Stand Pivot Transfers: 4: Min assist (with RW) Details for Transfer Assistance: labored effort, antaglic gait with Wide base of support, +SOB Ambulation/Gait Ambulation/Gait Assistance: Not tested (comment) (pt deferred at this time due to L LE pain)     Exercises     PT Diagnosis: Generalized weakness;Difficulty walking  PT Problem List: Decreased strength;Decreased activity tolerance;Decreased balance;Decreased mobility PT Treatment Interventions: DME instruction;Gait training;Stair training;Functional mobility training;Therapeutic activities;Therapeutic exercise     PT Goals(Current goals can be found in the care plan section) Acute Rehab PT Goals Patient Stated Goal: get better to go home PT Goal Formulation: With patient Time For Goal Achievement: 07/13/13 Potential to Achieve Goals: Good  Visit Information  Last PT Received On: 07/06/13 Assistance Needed: +1 History of Present Illness:  62 y.o. female sent to the ED for anemia.        Prior Functioning  Home Living Family/patient expects to be discharged to:: Private residence Living Arrangements: Spouse/significant other Available Help at Discharge: Family;Available 24 hours/day (via spouse) Type of Home: House Home Access: Ramped entrance;Stairs to enter (ramp in back, 2 steps in front) Entrance Stairs-Number of Steps: 2 Entrance Stairs-Rails: Can reach both Home Layout: One level Home Equipment: Walker - 2 wheels Additional Comments: pt on 2LO2 at home Prior Function Level of Independence: Independent with assistive device(s) Comments: uses electric cart for grocery shopping Communication Communication: No difficulties Dominant Hand: Right    Cognition  Cognition Arousal/Alertness: Awake/alert Behavior During Therapy: WFL for tasks assessed/performed Overall Cognitive Status: Within Functional Limits for tasks assessed    Extremity/Trunk Assessment Upper Extremity Assessment Upper Extremity Assessment: Overall WFL for tasks assessed Lower Extremity Assessment Lower Extremity Assessment: LLE deficits/detail LLE Deficits / Details: increased pain. grossly 3-/5 Cervical / Trunk Assessment Cervical / Trunk Assessment: Normal   Balance    End of Session PT  - End of  Session Equipment Utilized During Treatment: Gait belt;Oxygen Activity Tolerance: Patient limited by pain;Patient limited by fatigue Patient left: in chair;with call bell/phone within reach Nurse Communication: Mobility status (use BSC not bed pain)  GP Functional Assessment Tool Used: clinical judgement Functional Limitation: Mobility: Walking and moving around Mobility: Walking and Moving Around Current Status ([id number]): At least 20 percent but less than 40 percent impaired, limited or restricted Mobility: Walking and Moving Around Goal Status [phone]): At least 1 percent but less than 20 percent impaired, limited or restricted   Marcene Brawn 07/06/2013, 11:55 AM  Lewis Shock, PT, DPT Pager #: [phone] Office #: [phone]

## 2013-07-06 NOTE — Progress Notes (Signed)
Rehab Admissions Coordinator Note:  Patient was screened by Clois Dupes for appropriateness for an Inpatient Acute Rehab Consult.  At this time, we are recommending Inpatient Rehab consult.  Clois Dupes 07/06/2013, 4:36 PM  I can be reached at [phone].

## 2013-07-06 NOTE — Progress Notes (Signed)
Nutrition Brief Note  Patient identified on the Malnutrition Screening Tool (MST) Report. Pt denied any issues with appetite PTA. Pt reported on admission that he was unsure if he had any weight loss. Per weight hx; wt has been stable:  Wt Readings from Last 10 Encounters:  07/06/13 273 lb 2.4 oz (123.9 kg)  02/18/13 264 lb 5.3 oz (119.9 kg)  12/07/12 279 lb (126.554 kg)  01/04/13 270 lb (122.471 kg)  09/03/12 261 lb (118.389 kg)  08/29/12 249 lb 4.8 oz (113.082 kg)  05/26/12 264 lb 4 oz (119.863 kg)  04/28/12 259 lb 11.2 oz (117.8 kg)  03/09/12 265 lb 4 oz (120.317 kg)  02/04/12 262 lb 5.6 oz (119 kg)    Body mass index is 48.4 kg/(m^2). Patient meets criteria for Obese Class III based on current BMI.   Current diet order is Carbohydrate Modified Medium, patient is consuming approximately 100% of meals at this time. Labs and medications reviewed.   No nutrition interventions warranted at this time. If nutrition issues arise, please consult RD.   Jarold Motto MS, RD, LDN Pager: [phone] After-hours pager: [phone]

## 2013-07-06 NOTE — Progress Notes (Signed)
Admitted patient from ED via stretcher. Assisted to bed. Telemetry on.oriented to call bell and room . Instructed to call nurse for assistance. VSS. Continued to monitor patient.

## 2013-07-06 NOTE — Progress Notes (Signed)
[facility] PROGRESS NOTE  [name] YNW:[id number] DOB: [birth year] DOA: 07/05/2013 PCP: [name] [name], MD  Assessment/Plan: Active Problems:   HYPERLIPIDEMIA   OBSTRUCTIVE SLEEP APNEA   HYPERTENSION   DM (diabetes mellitus)   Chronic anticoagulation   Chronic venous insufficiency   CKD (chronic kidney disease), stage IV   Anemia of chronic disease   PAF (paroxysmal atrial fibrillation)   Psoriasis   Morbid obesity   Chronic combined systolic and diastolic CHF (congestive heart failure)     Anemia of chronic disease Status post transfusion of one unit We'll check an anemia panel, erythropoietin level likely secondary to chronic kidney disease Hemoglobin has not changed after one unit of transfusion We'll check stool guaiacs, start the patient on a PPI Patient is on Coumadin, therefore GI bleeding source needs to be ruled out Patient sees Dr. Loreta Ave, colonoscopy outpatient    Paroxysmal atrial fibrillation rate controlled continue amiodarone, on Coumadin per pharmacy   Chronic combined systolic diastolic heart failure without exacerbation continue Lasix, continue hydralazine  Diabetes mellitus  Stage IV chronic kidney disease hemoglobin at baseline, nephrology referral outpatient  Psoriasis patient has an outpatient dermatologist, no evidence of cellulitis next   Code Status: full Family Communication: family updated about patient's clinical progress Disposition Plan:  As above    Brief narrative: [name] is a 62 y.o. female sent to the ED for anemia. Hgb checked as outpatient. In ED, hgb is 7. Has h/o anemia of chronic disease. Also CKD. C/o weakness, dyspnea. No bleeding. CXR shows no pulmonary edema. proBNP 1000, which is lower than previous. 1 unit PRBC ordered to be transfused in the ED   Consultants:  None  Procedures:  None  Antibiotics:  None  HPI/Subjective: Hypertensive overnight, no complaints  Objective: Filed Vitals:    07/05/13 2212 07/05/13 2245 07/06/13 0215 07/06/13 0428  BP: 174/75 142/46  141/40  Pulse: 78 69 70 73  Temp:  98.4 F (36.9 C)  97.5 F (36.4 C)  TempSrc:    Oral  Resp: 22 20 20 19   Height:  5\' 3"  (1.6 m)    Weight:  123.1 kg (271 lb 6.2 oz)  123.9 kg (273 lb 2.4 oz)  SpO2: 99% 100% 100% 100%    Intake/Output Summary (Last 24 hours) at 07/06/13 0752 Last data filed at 07/06/13 0645  Gross per 24 hour  Intake    710 ml  Output   2250 ml  Net  -1540 ml    Exam:  HENT:  Head: Atraumatic.  Nose: Nose normal.  Mouth/Throat: Oropharynx is clear and moist.  Eyes: Conjunctivae are normal. Pupils are equal, round, and reactive to light. No scleral icterus.  Neck: Neck supple. No tracheal deviation present.  Cardiovascular: Normal rate, regular rhythm, normal heart sounds and intact distal pulses.  Pulmonary/Chest: Effort normal and breath sounds normal. No respiratory distress.  Abdominal: Soft. Normal appearance and bowel sounds are normal. She exhibits no distension. There is no tenderness.  Musculoskeletal: She exhibits no edema and no tenderness.  Neurological: She is alert. No cranial nerve deficit.    Data Reviewed: Basic Metabolic Panel:  Recent Labs Lab 07/01/13 1108 07/05/13 1536  NA 142 140  K 4.8 4.6  CL 111 107  CO2 20 22  GLUCOSE 83 80  BUN 37* 44*  CREATININE 2.65* 2.59*  CALCIUM 8.4 8.9    Liver Function Tests: No results found for this basename: AST, ALT, ALKPHOS, BILITOT, PROT, ALBUMIN,  in the last  168 hours No results found for this basename: LIPASE, AMYLASE,  in the last 168 hours No results found for this basename: AMMONIA,  in the last 168 hours  CBC:  Recent Labs Lab 07/01/13 1108 07/05/13 1536 07/06/13 0420  WBC 7.5 7.8 7.0  NEUTROABS 6.2  --   --   HGB 6.7* 7.0* 7.0*  HCT 22.4* 23.5* 24.1*  MCV 69.6* 72.1* 73.3*  PLT 336 344 311    Cardiac Enzymes: No results found for this basename: CKTOTAL, CKMB, CKMBINDEX, TROPONINI,   in the last 168 hours BNP (last 3 results)  Recent Labs  02/13/13 0345 02/15/13 0434 07/05/13 1536  PROBNP 698.5* 438.6* 1581.0*     CBG:  Recent Labs Lab 07/05/13 2305 07/06/13 0607  GLUCAP 117* 87    Recent Results (from the past 240 hour(s))  MRSA PCR SCREENING     Status: Abnormal   Collection Time    07/05/13 11:25 PM      Result Value Range Status   MRSA by PCR POSITIVE (*) NEGATIVE Final   Comment:            The GeneXpert MRSA Assay (FDA     approved for NASAL specimens     only), is one component of a     comprehensive MRSA colonization     surveillance program. It is not     intended to diagnose MRSA     infection nor to guide or     monitor treatment for     MRSA infections.     RESULT CALLED TO, READ BACK BY AND VERIFIED WITH:     CALLED TO RN Carleene Cooper [id number] @0559  THANEY     Studies: Dg Chest 2 View  07/05/2013   *RADIOLOGY REPORT*  Clinical Data: Shortness of breath  CHEST - 2 VIEW  Comparison: February 11, 2013  Findings: There is no edema or consolidation.  Heart is mildly enlarged with normal pulmonary vascularity.  No adenopathy.  There is degenerative change in the thoracic spine.  IMPRESSION: No edema or consolidation.  Cardiomegaly, stable.   Original Report Authenticated By: Bretta Bang, M.D.    Scheduled Meds: . albuterol  2 puff Inhalation QID  . allopurinol  100 mg Oral Daily  . amiodarone  200 mg Oral Daily  . amLODipine  10 mg Oral Daily  . atorvastatin  20 mg Oral QHS  . Chlorhexidine Gluconate Cloth  6 each Topical Q0600  . docusate sodium  100 mg Oral BID  . ferrous sulfate  325 mg Oral TID WC  . furosemide  40 mg Oral BID  . hydrALAZINE  50 mg Oral TID  . insulin glargine  20 Units Subcutaneous BID  . levofloxacin  250 mg Oral Daily  . mupirocin ointment  1 application Nasal BID  . pantoprazole  40 mg Oral Daily  . pantoprazole (PROTONIX) IV  40 mg Intravenous Q12H  . senna  1 tablet Oral BID  . sodium chloride  3  mL Intravenous Q12H  . sodium chloride  3 mL Intravenous Q12H  . spironolactone  25 mg Oral BID  . warfarin  10 mg Oral q1800  . Warfarin - Physician Dosing Inpatient   Does not apply q1800   Continuous Infusions:   Active Problems:   HYPERLIPIDEMIA   OBSTRUCTIVE SLEEP APNEA   HYPERTENSION   DM (diabetes mellitus)   Chronic anticoagulation   Chronic venous insufficiency   CKD (chronic kidney disease), stage IV   Anemia of  chronic disease   PAF (paroxysmal atrial fibrillation)   Psoriasis   Morbid obesity   Chronic combined systolic and diastolic CHF (congestive heart failure)    Time spent: 40 minutes   [facility]  [facility] Pager [phone]. If 8PM-8AM, please contact night-coverage at [email or web address], password [facility] 07/06/2013, 7:52 AM  LOS: 1 day

## 2013-07-06 NOTE — Progress Notes (Signed)
Utilization Review Completed.   Jaree Dwight, RN, BSN Nurse Case Manager  [phone]  

## 2013-07-06 NOTE — Progress Notes (Signed)
Pt called several more times wanting graham crackers. Offered pt water or diet soda. Said she only wants crackers. Pt given 2 graham crackers at 1100. Pt given some Diabetic teaching material on portion control and 1800 cal ADA diet plans and meal suggestions plus sick day management

## 2013-07-07 DIAGNOSIS — I4891 Unspecified atrial fibrillation: Secondary | ICD-10-CM

## 2013-07-07 DIAGNOSIS — N184 Chronic kidney disease, stage 4 (severe): Secondary | ICD-10-CM

## 2013-07-07 DIAGNOSIS — I5041 Acute combined systolic (congestive) and diastolic (congestive) heart failure: Secondary | ICD-10-CM

## 2013-07-07 DIAGNOSIS — L408 Other psoriasis: Secondary | ICD-10-CM

## 2013-07-07 DIAGNOSIS — G4733 Obstructive sleep apnea (adult) (pediatric): Secondary | ICD-10-CM

## 2013-07-07 LAB — GLUCOSE, CAPILLARY: Glucose-Capillary: 134 mg/dL — ABNORMAL HIGH (ref 70–99)

## 2013-07-07 LAB — CBC
HCT: 25 % — ABNORMAL LOW (ref 36.0–46.0)
MCHC: 30 g/dL (ref 30.0–36.0)
MCV: 73.7 fL — ABNORMAL LOW (ref 78.0–100.0)
Platelets: 329 10*3/uL (ref 150–400)
RDW: 19.8 % — ABNORMAL HIGH (ref 11.5–15.5)

## 2013-07-07 LAB — BASIC METABOLIC PANEL
BUN: 43 mg/dL — ABNORMAL HIGH (ref 6–23)
Creatinine, Ser: 2.87 mg/dL — ABNORMAL HIGH (ref 0.50–1.10)
GFR calc Af Amer: 19 mL/min — ABNORMAL LOW (ref >90)
GFR calc non Af Amer: 16 mL/min — ABNORMAL LOW (ref >90)
Potassium: 4.5 mEq/L (ref 3.5–5.1)

## 2013-07-07 LAB — PROTIME-INR: INR: 2.53 — ABNORMAL HIGH (ref 0.00–1.49)

## 2013-07-07 NOTE — Discharge Summary (Signed)
Physician Discharge Summary  [name]:[id number] DOB: 1951-09-09 DOA: 07/05/2013  PCP: Willey Blade, MD  Admit date: 07/05/2013 Discharge date: 07/07/2013  Time spent:  Recommendations for Outpatient Follow-up:  1. Anemia of chronic disease  Status post transfusion of one unit . H./H. stable today patient's anemia can be worked up as an outpatient  2. Paroxysmal atrial fibrillation rate controlled continue amiodarone, on Coumadin per pharmacy   3. Chronic combined systolic diastolic heart failure without exacerbation continue Lasix, continue hydralazine   4. Diabetes mellitus; patient will need to address with her PCP last hemoglobin A1c was 11/23/2011= 7.0   5. Stage IV chronic kidney disease; at baseline patient will need a nephrologist as an outpatient   6.Psoriasis; discussed with patient why would not start her on an immunosuppressant at this time and patient understood requirement for close monitoring. Has an outpatient dermatologist,    Discharge Diagnoses:  Active Problems:   HYPERLIPIDEMIA   OBSTRUCTIVE SLEEP APNEA   HYPERTENSION   DM (diabetes mellitus)   Chronic anticoagulation   Chronic venous insufficiency   CKD (chronic kidney disease), stage IV   Anemia of chronic disease   PAF (paroxysmal atrial fibrillation)   Psoriasis   Morbid obesity   Chronic combined systolic and diastolic CHF (congestive heart failure)   Discharge Condition: Stable   Diet recommendation: Diabetic, heart healthy  Filed Weights   07/05/13 2245 07/06/13 0428 07/07/13 0644  Weight: 123.1 kg (271 lb 6.2 oz) 123.9 kg (273 lb 2.4 oz) 122.698 kg (270 lb 8 oz)    History of present illness:  [name] is a 62 y.o. BF PMHx OSA (not on CPAP secondary to missing a part to her machine per patient), HTN, DM, CKD stage IV, PAF, moderate to severe psoriasis, CHF. Per patient sent to the ED for anemia. Hgb checked as outpatient. In ED, hgb is 7. Has h/o anemia of  chronic disease. Also CKD. C/o weakness, dyspnea. No bleeding. CXR shows no pulmonary edema. proBNP 1000, which is lower than previous. 1 unit PRBC ordered to be transfused in the ED. TODAY patient requesting to go home. States has an appointment artery scheduled in August with a new dermatologist for her psoriasis.    Discharge Exam: Filed Vitals:   07/06/13 2100 07/06/13 2105 07/07/13 0144 07/07/13 0644  BP: 155/56  160/81 139/44  Pulse: 63  73 70  Temp: 98.3 F (36.8 C)  98.1 F (36.7 C) 98 F (36.7 C)  TempSrc: Oral  Oral Oral  Resp: 20  20 20   Height:      Weight:    122.698 kg (270 lb 8 oz)  SpO2: 100% 100% 98% 97%    General: Alert, NAD Cardiovascular: Irregular irregular rhythm and rate, negative murmurs rubs or gallops, PD/PT pulse 2+ bilateral Respiratory: Clear to auscultation bilateral  Discharge Instructions   Future Appointments Provider Department Dept Phone   10/04/2013 10:00 AM Randall Hiss, MD [facility] [phone]       Medication List    ASK your doctor about these medications       albuterol 108 (90 BASE) MCG/ACT inhaler  Commonly known as:  PROVENTIL HFA;VENTOLIN HFA  Inhale 2 puffs into the lungs every 6 (six) hours as needed for wheezing or shortness of breath.     allopurinol 100 MG tablet  Commonly known as:  ZYLOPRIM  Take 100 mg by mouth daily.     amiodarone 200 MG  tablet  Commonly known as:  PACERONE  Take 200 mg by mouth daily.     amLODipine 10 MG tablet  Commonly known as:  NORVASC  Take 10 mg by mouth daily.     atorvastatin 20 MG tablet  Commonly known as:  LIPITOR  Take 20 mg by mouth at bedtime.     ferrous sulfate 325 (65 FE) MG tablet  Take 325 mg by mouth 3 (three) times daily with meals.     furosemide 40 MG tablet  Commonly known as:  LASIX  Take 40 mg by mouth 2 (two) times daily.     hydrALAZINE 50 MG tablet  Commonly known as:  APRESOLINE  Take 50 mg by mouth 3  (three) times daily.     insulin aspart 100 UNIT/ML injection  Commonly known as:  novoLOG  Inject 0-20 Units into the skin 3 (three) times daily before meals. Per sliding scale     insulin glargine 100 UNIT/ML injection  Commonly known as:  LANTUS  Inject 20 Units into the skin 2 (two) times daily.     levofloxacin 250 MG tablet  Commonly known as:  LEVAQUIN  Take 250 mg by mouth daily.     oxyCODONE-acetaminophen 5-325 MG per tablet  Commonly known as:  PERCOCET/ROXICET  Take 1 tablet by mouth every 8 (eight) hours as needed for pain.     pantoprazole 40 MG tablet  Commonly known as:  PROTONIX  Take 40 mg by mouth daily.     spironolactone 25 MG tablet  Commonly known as:  ALDACTONE  Take 25 mg by mouth 2 (two) times daily.     warfarin 5 MG tablet  Commonly known as:  COUMADIN  Take 10 mg by mouth daily.     zolpidem 5 MG tablet  Commonly known as:  AMBIEN  Take 5 mg by mouth at bedtime as needed for sleep.       Allergies  Allergen Reactions  . Daptomycin Other (See Comments)    Elevated CPK  . Morphine And Related Nausea And Vomiting  . Cephalexin Rash    unknown  . Celecoxib     unknown  . Codeine     unknown  . Fluoxetine Hcl     unknown  . Latex     REACTION: undefined  . Ofloxacin     unknown  . Penicillins     unknown  . Rofecoxib     unknown  . Sulfonamide Derivatives     unknown      The results of significant diagnostics from this hospitalization (including imaging, microbiology, ancillary and laboratory) are listed below for reference.    Significant Diagnostic Studies: Dg Chest 2 View  07/05/2013   *RADIOLOGY REPORT*  Clinical Data: Shortness of breath  CHEST - 2 VIEW  Comparison: February 11, 2013  Findings: There is no edema or consolidation.  Heart is mildly enlarged with normal pulmonary vascularity.  No adenopathy.  There is degenerative change in the thoracic spine.  IMPRESSION: No edema or consolidation.  Cardiomegaly, stable.    Original Report Authenticated By: Bretta Bang, M.D.    Microbiology: Recent Results (from the past 240 hour(s))  MRSA PCR SCREENING     Status: Abnormal   Collection Time    07/05/13 11:25 PM      Result Value Range Status   MRSA by PCR POSITIVE (*) NEGATIVE Final   Comment:            The  GeneXpert MRSA Assay (FDA     approved for NASAL specimens     only), is one component of a     comprehensive MRSA colonization     surveillance program. It is not     intended to diagnose MRSA     infection nor to guide or     monitor treatment for     MRSA infections.     RESULT CALLED TO, READ BACK BY AND VERIFIED WITH:     CALLED TO RN COLUMBRES,RODNEY [id number] @0559  THANEY     Labs: Basic Metabolic Panel:  Recent Labs Lab 07/01/13 1108 07/05/13 1536 07/06/13 0842 07/07/13 0400  NA 142 140 141 139  K 4.8 4.6 4.4 4.5  CL 111 107 108 104  CO2 20 22 24 26   GLUCOSE 83 80 103* 82  BUN 37* 44* 40* 43*  CREATININE 2.65* 2.59* 2.38* 2.87*  CALCIUM 8.4 8.9 9.0 9.1   Liver Function Tests:  Recent Labs Lab 07/06/13 0842  AST 11  ALT 8  ALKPHOS 101  BILITOT 0.2*  PROT 7.4  ALBUMIN 2.6*   No results found for this basename: LIPASE, AMYLASE,  in the last 168 hours No results found for this basename: AMMONIA,  in the last 168 hours CBC:  Recent Labs Lab 07/01/13 1108 07/05/13 1536 07/06/13 0420 07/07/13 0400  WBC 7.5 7.8 7.0 7.0  NEUTROABS 6.2  --   --   --   HGB 6.7* 7.0* 7.0* 7.5*  HCT 22.4* 23.5* 24.1* 25.0*  MCV 69.6* 72.1* 73.3* 73.7*  PLT 336 344 311 329   Cardiac Enzymes:  Recent Labs Lab 07/06/13 0842 07/06/13 1419 07/06/13 1936  TROPONINI <0.30 <0.30 <0.30   BNP: BNP (last 3 results)  Recent Labs  02/13/13 0345 02/15/13 0434 07/05/13 1536  PROBNP 698.5* 438.6* 1581.0*   CBG:  Recent Labs Lab 07/05/13 2305 07/06/13 0607 07/06/13 1106 07/06/13 2052 07/07/13 0659  GLUCAP 117* 87 96 106* 71       Signed:  WOODS, CURTIS,  J  [facility] 07/07/2013, 7:43 AM

## 2013-07-07 NOTE — Care Management Note (Signed)
    Page 1 of 1   07/07/2013     12:44:31 PM   CARE MANAGEMENT NOTE 07/07/2013  Patient:  [name], [name]   Account Number:  [id number]  Date Initiated:  07/07/2013  Documentation initiated by:  Tera Mater  Subjective/Objective Assessment:   62 y.o. female sent to the ED for anemia     Action/Plan:   discharge planning   Anticipated DC Date:  07/07/2013   Anticipated DC Plan:  HOME W HOME HEALTH SERVICES      DC Planning Services  CM consult      Choice offered to / List presented to:          [facility] arranged  HH-1 RN  HH-2 PT  HH-3 OT      [facility] agency  [facility]   Status of service:  Completed, signed off Medicare Important Message given?   (If response is "NO", the following Medicare IM given date fields will be blank) Date Medicare IM given:   Date Additional Medicare IM given:    Discharge Disposition:  HOME W HOME HEALTH SERVICES  Per UR Regulation:  Reviewed for med. necessity/level of care/duration of stay  If discussed at Long Length of Stay Meetings, dates discussed:    Comments:  07/07/13 1130 In to speak with pt. about home health services.  Pt. states she has had [facility] home health in past and was satisfied with their services.  Spoke with Corrie Dandy, with [facility], to give referral for [facility] PT/OT/RN. Pt. to dc home today with family.  Pt. states she has a rolling walker. Tera Mater, RN, BSN NCM [phone]

## 2013-07-07 NOTE — Progress Notes (Signed)
Pt in bed O4x. No complaints of CP or SOB

## 2013-07-07 NOTE — Progress Notes (Addendum)
Call for follow up post HP w/PCP. Pcp closed on wednesdays. Pt states has appt on 8-12

## 2013-07-07 NOTE — Plan of Care (Signed)
Problem: Food- and Nutrition-Related Knowledge Deficit (NB-1.1) Goal: Nutrition education Formal process to instruct or train a patient/client in a skill or to impart knowledge to help patients/clients voluntarily manage or modify food choices and eating behavior to maintain or improve health. Outcome: Completed/Met Date Met:  07/07/13  RD consulted for nutrition education regarding diabetes and heart healthy diet. Pt asked for RD to leave materials at bedside, she stated that she would review on her own, did not want RD to go over the information with her. RD briefly discussed importance of following nutrition recommendations.    Lab Results  Component Value Date    HGBA1C 7.0* 11/23/2011    RD provided "Carbohydrate Counting for People with Diabetes" handout from the Academy of Nutrition and Dietetics. Discussed different food groups and their effects on blood sugar, emphasizing carbohydrate-containing foods. Provided list of carbohydrates and recommended serving sizes of common foods.  Discussed importance of controlled and consistent carbohydrate intake throughout the day. Provided examples of ways to balance meals/snacks and encouraged intake of high-fiber, whole grain complex carbohydrates.   RD also provided "Heart Healthy Nutrition Therapy" handout from the Academy of Nutrition and Dietetics. Reviewed patient's dietary recall. Provided examples on ways to decrease sodium and fat intake in diet. Discouraged intake of processed foods and use of salt shaker. Encouraged fresh fruits and vegetables as well as whole grain sources of carbohydrates to maximize fiber intake. Teach back method used.  Expect poor compliance.  Body mass index is 47.93 kg/(m^2). Pt meets criteria for Obese Class III (Extreme) based on current BMI.  Current diet order is Carbohydrate Modified, patient is consuming approximately 100% of meals at this time. Labs and medications reviewed. No further nutrition  interventions warranted at this time. RD contact information provided. If additional nutrition issues arise, please re-consult RD.  Jarold Motto MS, RD, LDN Pager: [phone] After-hours pager: [phone]

## 2013-07-08 ENCOUNTER — Telehealth: Payer: Self-pay | Admitting: *Deleted

## 2013-07-08 LAB — TYPE AND SCREEN
Unit division: 0
Unit division: 0

## 2013-07-08 NOTE — Telephone Encounter (Signed)
Pt told by hospital MD that she did not need to continue Levaquin after discharge from hospital.  Pt calling to find out what Dr. Daiva Eves wants her to do.  Phone call to Dr. Daiva Eves.  Pt is to continue Levaquin.  Phone call to pt.  Advised that Dr. Daiva Eves wants her to continue Levaquin.  Pt verbalized understanding to continue taking Levaquin daily.

## 2013-08-03 ENCOUNTER — Encounter (HOSPITAL_COMMUNITY): Payer: Self-pay | Admitting: Emergency Medicine

## 2013-08-03 ENCOUNTER — Inpatient Hospital Stay (HOSPITAL_COMMUNITY)
Admission: EM | Admit: 2013-08-03 | Discharge: 2013-08-08 | DRG: 291 | Disposition: A | Discharge status: Home Health | Payer: Medicare HMO | Attending: Internal Medicine | Admitting: Internal Medicine

## 2013-08-03 ENCOUNTER — Emergency Department (HOSPITAL_COMMUNITY): Payer: Medicare HMO

## 2013-08-03 DIAGNOSIS — J9600 Acute respiratory failure, unspecified whether with hypoxia or hypercapnia: Secondary | ICD-10-CM

## 2013-08-03 DIAGNOSIS — I509 Heart failure, unspecified: Secondary | ICD-10-CM | POA: Diagnosis present

## 2013-08-03 DIAGNOSIS — I5041 Acute combined systolic (congestive) and diastolic (congestive) heart failure: Secondary | ICD-10-CM

## 2013-08-03 DIAGNOSIS — I872 Venous insufficiency (chronic) (peripheral): Secondary | ICD-10-CM | POA: Diagnosis present

## 2013-08-03 DIAGNOSIS — G4733 Obstructive sleep apnea (adult) (pediatric): Secondary | ICD-10-CM | POA: Diagnosis present

## 2013-08-03 DIAGNOSIS — Y831 Surgical operation with implant of artificial internal device as the cause of abnormal reaction of the patient, or of later complication, without mention of misadventure at the time of the procedure: Secondary | ICD-10-CM | POA: Diagnosis present

## 2013-08-03 DIAGNOSIS — T84039A Mechanical loosening of unspecified internal prosthetic joint, initial encounter: Secondary | ICD-10-CM | POA: Diagnosis present

## 2013-08-03 DIAGNOSIS — Z96659 Presence of unspecified artificial knee joint: Secondary | ICD-10-CM

## 2013-08-03 DIAGNOSIS — Z6841 Body Mass Index (BMI) 40.0 and over, adult: Secondary | ICD-10-CM

## 2013-08-03 DIAGNOSIS — L408 Other psoriasis: Secondary | ICD-10-CM | POA: Diagnosis present

## 2013-08-03 DIAGNOSIS — E785 Hyperlipidemia, unspecified: Secondary | ICD-10-CM | POA: Diagnosis present

## 2013-08-03 DIAGNOSIS — I480 Paroxysmal atrial fibrillation: Secondary | ICD-10-CM

## 2013-08-03 DIAGNOSIS — Z5189 Encounter for other specified aftercare: Secondary | ICD-10-CM

## 2013-08-03 DIAGNOSIS — I4891 Unspecified atrial fibrillation: Secondary | ICD-10-CM

## 2013-08-03 DIAGNOSIS — E119 Type 2 diabetes mellitus without complications: Secondary | ICD-10-CM | POA: Diagnosis present

## 2013-08-03 DIAGNOSIS — J9620 Acute and chronic respiratory failure, unspecified whether with hypoxia or hypercapnia: Secondary | ICD-10-CM | POA: Diagnosis present

## 2013-08-03 DIAGNOSIS — M109 Gout, unspecified: Secondary | ICD-10-CM | POA: Diagnosis present

## 2013-08-03 DIAGNOSIS — N184 Chronic kidney disease, stage 4 (severe): Secondary | ICD-10-CM | POA: Diagnosis present

## 2013-08-03 DIAGNOSIS — B369 Superficial mycosis, unspecified: Secondary | ICD-10-CM | POA: Diagnosis present

## 2013-08-03 DIAGNOSIS — I10 Essential (primary) hypertension: Secondary | ICD-10-CM | POA: Diagnosis present

## 2013-08-03 DIAGNOSIS — A491 Streptococcal infection, unspecified site: Secondary | ICD-10-CM

## 2013-08-03 DIAGNOSIS — E662 Morbid (severe) obesity with alveolar hypoventilation: Secondary | ICD-10-CM | POA: Diagnosis present

## 2013-08-03 DIAGNOSIS — E1129 Type 2 diabetes mellitus with other diabetic kidney complication: Secondary | ICD-10-CM | POA: Diagnosis present

## 2013-08-03 DIAGNOSIS — L409 Psoriasis, unspecified: Secondary | ICD-10-CM | POA: Diagnosis present

## 2013-08-03 DIAGNOSIS — Z87891 Personal history of nicotine dependence: Secondary | ICD-10-CM

## 2013-08-03 DIAGNOSIS — D638 Anemia in other chronic diseases classified elsewhere: Secondary | ICD-10-CM | POA: Diagnosis present

## 2013-08-03 DIAGNOSIS — Z794 Long term (current) use of insulin: Secondary | ICD-10-CM

## 2013-08-03 DIAGNOSIS — I5043 Acute on chronic combined systolic (congestive) and diastolic (congestive) heart failure: Secondary | ICD-10-CM | POA: Diagnosis present

## 2013-08-03 DIAGNOSIS — Z7901 Long term (current) use of anticoagulants: Secondary | ICD-10-CM

## 2013-08-03 DIAGNOSIS — T8450XA Infection and inflammatory reaction due to unspecified internal joint prosthesis, initial encounter: Secondary | ICD-10-CM

## 2013-08-03 DIAGNOSIS — T8450XS Infection and inflammatory reaction due to unspecified internal joint prosthesis, sequela: Secondary | ICD-10-CM

## 2013-08-03 DIAGNOSIS — Z79899 Other long term (current) drug therapy: Secondary | ICD-10-CM

## 2013-08-03 DIAGNOSIS — I5031 Acute diastolic (congestive) heart failure: Secondary | ICD-10-CM

## 2013-08-03 DIAGNOSIS — J189 Pneumonia, unspecified organism: Secondary | ICD-10-CM | POA: Diagnosis present

## 2013-08-03 DIAGNOSIS — I129 Hypertensive chronic kidney disease with stage 1 through stage 4 chronic kidney disease, or unspecified chronic kidney disease: Secondary | ICD-10-CM | POA: Diagnosis present

## 2013-08-03 DIAGNOSIS — N179 Acute kidney failure, unspecified: Secondary | ICD-10-CM | POA: Diagnosis not present

## 2013-08-03 DIAGNOSIS — T8450XD Infection and inflammatory reaction due to unspecified internal joint prosthesis, subsequent encounter: Secondary | ICD-10-CM

## 2013-08-03 DIAGNOSIS — B373 Candidiasis of vulva and vagina: Secondary | ICD-10-CM

## 2013-08-03 HISTORY — DX: Pneumonia, unspecified organism: J18.9

## 2013-08-03 HISTORY — DX: Gastro-esophageal reflux disease without esophagitis: K21.9

## 2013-08-03 LAB — BASIC METABOLIC PANEL
BUN: 33 mg/dL — ABNORMAL HIGH (ref 6–23)
CO2: 25 mEq/L (ref 19–32)
Chloride: 108 mEq/L (ref 96–112)
GFR calc Af Amer: 31 mL/min — ABNORMAL LOW (ref >90)
Glucose, Bld: 94 mg/dL (ref 70–99)
Potassium: 4.8 mEq/L (ref 3.5–5.1)

## 2013-08-03 LAB — URINALYSIS, ROUTINE W REFLEX MICROSCOPIC
Bilirubin Urine: NEGATIVE
Nitrite: NEGATIVE
Specific Gravity, Urine: 1.014 (ref 1.005–1.030)
pH: 6 (ref 5.0–8.0)

## 2013-08-03 LAB — URINE MICROSCOPIC-ADD ON

## 2013-08-03 LAB — CBC WITH DIFFERENTIAL/PLATELET
Basophils Relative: 0 % (ref 0–1)
Eosinophils Absolute: 0.1 10*3/uL (ref 0.0–0.7)
Eosinophils Relative: 1 % (ref 0–5)
Hemoglobin: 7.5 g/dL — ABNORMAL LOW (ref 12.0–15.0)
Lymphocytes Relative: 7 % — ABNORMAL LOW (ref 12–46)
Monocytes Relative: 5 % (ref 3–12)
Neutrophils Relative %: 87 % — ABNORMAL HIGH (ref 43–77)
RBC: 3.48 MIL/uL — ABNORMAL LOW (ref 3.87–5.11)

## 2013-08-03 LAB — APTT: aPTT: 73 seconds — ABNORMAL HIGH (ref 24–37)

## 2013-08-03 LAB — PROTIME-INR: INR: 3.14 — ABNORMAL HIGH (ref 0.00–1.49)

## 2013-08-03 LAB — GLUCOSE, CAPILLARY: Glucose-Capillary: 170 mg/dL — ABNORMAL HIGH (ref 70–99)

## 2013-08-03 LAB — TROPONIN I: Troponin I: <0.3 ng/mL (ref <0.30)

## 2013-08-03 MED ORDER — FLUCONAZOLE IN SODIUM CHLORIDE 200-0.9 MG/100ML-% IV SOLN
200.0000 mg | INTRAVENOUS | Status: DC
  Administered 2013-08-04: 200 mg via INTRAVENOUS
  Filled 2013-08-03 (×2): qty 100

## 2013-08-03 MED ORDER — AZTREONAM 2 G IJ SOLR
2.0000 g | Freq: Three times a day (TID) | INTRAMUSCULAR | Status: DC
  Administered 2013-08-03 – 2013-08-04 (×2): 2 g via INTRAVENOUS
  Filled 2013-08-03 (×4): qty 2

## 2013-08-03 MED ORDER — VANCOMYCIN HCL IN DEXTROSE 1-5 GM/200ML-% IV SOLN
1000.0000 mg | Freq: Once | INTRAVENOUS | Status: AC
  Administered 2013-08-03: 1000 mg via INTRAVENOUS
  Filled 2013-08-03: qty 200

## 2013-08-03 MED ORDER — DIPHENHYDRAMINE HCL 25 MG PO CAPS
25.0000 mg | ORAL_CAPSULE | Freq: Three times a day (TID) | ORAL | Status: DC | PRN
  Administered 2013-08-04 – 2013-08-05 (×2): 25 mg via ORAL
  Filled 2013-08-03 (×2): qty 1

## 2013-08-03 MED ORDER — ACETAMINOPHEN 325 MG PO TABS
650.0000 mg | ORAL_TABLET | Freq: Four times a day (QID) | ORAL | Status: DC | PRN
  Administered 2013-08-06: 650 mg via ORAL
  Filled 2013-08-03: qty 2

## 2013-08-03 MED ORDER — SPIRONOLACTONE 25 MG PO TABS
25.0000 mg | ORAL_TABLET | Freq: Two times a day (BID) | ORAL | Status: DC
  Administered 2013-08-03 – 2013-08-06 (×6): 25 mg via ORAL
  Filled 2013-08-03 (×7): qty 1

## 2013-08-03 MED ORDER — FLUCONAZOLE IN SODIUM CHLORIDE 400-0.9 MG/200ML-% IV SOLN
400.0000 mg | Freq: Once | INTRAVENOUS | Status: AC
  Administered 2013-08-04: 400 mg via INTRAVENOUS
  Filled 2013-08-03: qty 200

## 2013-08-03 MED ORDER — WARFARIN - PHARMACIST DOSING INPATIENT
Freq: Every day | Status: DC
  Administered 2013-08-04 – 2013-08-07 (×2)

## 2013-08-03 MED ORDER — LEVOFLOXACIN IN D5W 500 MG/100ML IV SOLN
500.0000 mg | Freq: Once | INTRAVENOUS | Status: DC
  Filled 2013-08-03 (×2): qty 100

## 2013-08-03 MED ORDER — ONDANSETRON HCL 4 MG/2ML IJ SOLN
4.0000 mg | Freq: Four times a day (QID) | INTRAMUSCULAR | Status: DC | PRN
  Administered 2013-08-08: 4 mg via INTRAVENOUS
  Filled 2013-08-03: qty 2

## 2013-08-03 MED ORDER — SODIUM CHLORIDE 0.9 % IJ SOLN
3.0000 mL | Freq: Two times a day (BID) | INTRAMUSCULAR | Status: DC
  Administered 2013-08-04 – 2013-08-08 (×9): 3 mL via INTRAVENOUS

## 2013-08-03 MED ORDER — OXYCODONE-ACETAMINOPHEN 5-325 MG PO TABS
1.0000 | ORAL_TABLET | Freq: Three times a day (TID) | ORAL | Status: DC | PRN
  Administered 2013-08-03 – 2013-08-07 (×6): 1 via ORAL
  Filled 2013-08-03 (×6): qty 1

## 2013-08-03 MED ORDER — WARFARIN SODIUM 2.5 MG PO TABS
2.5000 mg | ORAL_TABLET | Freq: Once | ORAL | Status: AC
  Administered 2013-08-03: 2.5 mg via ORAL
  Filled 2013-08-03: qty 1

## 2013-08-03 MED ORDER — AMLODIPINE BESYLATE 10 MG PO TABS
10.0000 mg | ORAL_TABLET | Freq: Every day | ORAL | Status: DC
  Administered 2013-08-03 – 2013-08-08 (×6): 10 mg via ORAL
  Filled 2013-08-03 (×6): qty 1

## 2013-08-03 MED ORDER — ONDANSETRON HCL 4 MG PO TABS
4.0000 mg | ORAL_TABLET | Freq: Four times a day (QID) | ORAL | Status: DC | PRN
  Administered 2013-08-07: 4 mg via ORAL
  Filled 2013-08-03: qty 1

## 2013-08-03 MED ORDER — VANCOMYCIN HCL 10 G IV SOLR
1500.0000 mg | Freq: Once | INTRAVENOUS | Status: AC
  Administered 2013-08-03: 1500 mg via INTRAVENOUS
  Filled 2013-08-03: qty 1500

## 2013-08-03 MED ORDER — ALBUTEROL SULFATE (5 MG/ML) 0.5% IN NEBU
5.0000 mg | INHALATION_SOLUTION | Freq: Once | RESPIRATORY_TRACT | Status: AC
  Administered 2013-08-03: 5 mg via RESPIRATORY_TRACT
  Filled 2013-08-03: qty 1

## 2013-08-03 MED ORDER — ALBUTEROL SULFATE (5 MG/ML) 0.5% IN NEBU
2.5000 mg | INHALATION_SOLUTION | RESPIRATORY_TRACT | Status: DC | PRN
  Administered 2013-08-03: 2.5 mg via RESPIRATORY_TRACT
  Filled 2013-08-03: qty 0.5

## 2013-08-03 MED ORDER — ATORVASTATIN CALCIUM 20 MG PO TABS
20.0000 mg | ORAL_TABLET | Freq: Every day | ORAL | Status: DC
  Administered 2013-08-03 – 2013-08-07 (×5): 20 mg via ORAL
  Filled 2013-08-03 (×7): qty 1

## 2013-08-03 MED ORDER — AMIODARONE HCL 200 MG PO TABS
200.0000 mg | ORAL_TABLET | Freq: Every day | ORAL | Status: DC
  Administered 2013-08-03 – 2013-08-08 (×6): 200 mg via ORAL
  Filled 2013-08-03 (×6): qty 1

## 2013-08-03 MED ORDER — PANTOPRAZOLE SODIUM 40 MG PO TBEC
40.0000 mg | DELAYED_RELEASE_TABLET | Freq: Every day | ORAL | Status: DC
  Administered 2013-08-03: 40 mg via ORAL
  Filled 2013-08-03: qty 1

## 2013-08-03 MED ORDER — INSULIN GLARGINE 100 UNIT/ML ~~LOC~~ SOLN
20.0000 [IU] | Freq: Two times a day (BID) | SUBCUTANEOUS | Status: DC
  Administered 2013-08-03 – 2013-08-07 (×9): 20 [IU] via SUBCUTANEOUS
  Filled 2013-08-03 (×12): qty 0.2

## 2013-08-03 MED ORDER — HYDRALAZINE HCL 50 MG PO TABS
50.0000 mg | ORAL_TABLET | Freq: Three times a day (TID) | ORAL | Status: DC
  Administered 2013-08-03 – 2013-08-08 (×15): 50 mg via ORAL
  Filled 2013-08-03 (×18): qty 1

## 2013-08-03 MED ORDER — OXYCODONE-ACETAMINOPHEN 5-325 MG PO TABS
1.0000 | ORAL_TABLET | Freq: Once | ORAL | Status: AC
  Administered 2013-08-03: 1 via ORAL
  Filled 2013-08-03: qty 1

## 2013-08-03 MED ORDER — FUROSEMIDE 10 MG/ML IJ SOLN
60.0000 mg | Freq: Two times a day (BID) | INTRAMUSCULAR | Status: DC
  Administered 2013-08-03 – 2013-08-05 (×4): 60 mg via INTRAVENOUS
  Filled 2013-08-03 (×4): qty 6

## 2013-08-03 MED ORDER — INSULIN ASPART 100 UNIT/ML ~~LOC~~ SOLN: 0.0000 [IU] | Freq: Every day | SUBCUTANEOUS | Status: DC

## 2013-08-03 MED ORDER — ZOLPIDEM TARTRATE 5 MG PO TABS
5.0000 mg | ORAL_TABLET | Freq: Every evening | ORAL | Status: DC | PRN
  Administered 2013-08-06 – 2013-08-07 (×2): 5 mg via ORAL
  Filled 2013-08-03 (×2): qty 1

## 2013-08-03 MED ORDER — VANCOMYCIN HCL 10 G IV SOLR
1500.0000 mg | INTRAVENOUS | Status: DC
  Filled 2013-08-03: qty 1500

## 2013-08-03 MED ORDER — WARFARIN SODIUM 2.5 MG PO TABS: 2.5000 mg | ORAL_TABLET | Freq: Once | ORAL | Status: DC

## 2013-08-03 MED ORDER — FERROUS SULFATE 325 (65 FE) MG PO TABS
325.0000 mg | ORAL_TABLET | Freq: Three times a day (TID) | ORAL | Status: DC
  Administered 2013-08-04 – 2013-08-08 (×14): 325 mg via ORAL
  Filled 2013-08-03 (×17): qty 1

## 2013-08-03 MED ORDER — INSULIN ASPART 100 UNIT/ML ~~LOC~~ SOLN
0.0000 [IU] | Freq: Three times a day (TID) | SUBCUTANEOUS | Status: DC
  Administered 2013-08-06: 1 [IU] via SUBCUTANEOUS

## 2013-08-03 MED ORDER — ALLOPURINOL 100 MG PO TABS
100.0000 mg | ORAL_TABLET | Freq: Every day | ORAL | Status: DC
  Administered 2013-08-03 – 2013-08-08 (×6): 100 mg via ORAL
  Filled 2013-08-03 (×6): qty 1

## 2013-08-03 MED ORDER — ACETAMINOPHEN 650 MG RE SUPP: 650.0000 mg | Freq: Four times a day (QID) | RECTAL | Status: DC | PRN

## 2013-08-03 NOTE — ED Notes (Signed)
Pa in to see pt

## 2013-08-03 NOTE — ED Notes (Signed)
Per EMS - pt c/o left knee pain, sts she is on Levoquin for the infection and sts hx of gout. Pt reports she is unable to walk on it. BP 200 palpated, hx of HTN hasn't taken any meds today. HR 78 RR 18-22. Hx of CHF. O2 sats at 98% on room air.

## 2013-08-03 NOTE — Progress Notes (Signed)
Pt stated feeling SOB, PRN alb given, lasix 60IV given. We inform night RN

## 2013-08-03 NOTE — ED Notes (Signed)
States did take percocet this weekend and it did help pain. States she is out of percocet

## 2013-08-03 NOTE — H&P (Addendum)
[facility] History and Physical  [name] ZOX:[id number] DOB: 10-21-1951 DOA: 08/03/2013  Referring physician: EDP PCP: Willey Blade, MD  OP Specialists:  1. Cardiology: Dr. Rinaldo Cloud 2. ID: Dr. Melba Coon  Chief Complaint: Left knee pain and worsening dyspnea.  HPI: [name] is a 62 y.o. 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 4 days ago patient noted difficulty weightbearing on left knee secondary to pain. She normally ambulates with the help of a walker. She denies history of trauma. Her legs are chronically swollen left greater than right and she has not noticed any change. No redness. Denies fever or chills. She also complains of worsening dyspnea and dry cough. No chest pain. Seems to have chronic dyspnea/orthopnea of 2 pillows. Claims compliance to medications and cannot truly safe she has gained weight. She also complains of extensive rash underneath her breasts, groin, external genitalia and apparently sees him OP dermatologist for psoriasis. In the ED, creatinine 1.9 (baseline probably 2.5), Pro BNP 1789, troponin negative, hemoglobin 7.5 (baseline), chest x-ray-cardiomegaly, vascular congestion and right infrahilar atelectasis or infiltrate and x-ray of left knee without complicating features. Hospitalist admission requested for possible HCAP.   Review of Systems:  all systems reviewed and apart from history of presenting illness, are negative.   Past Medical History  Diagnosis Date  . Asthma   . Bronchitis   . Allergic  rhinitis   . Diabetes mellitus   . HTN (hypertension)   . DJD (degenerative joint disease)   . Hyperlipidemia   . Complication of anesthesia     trouble waking up  . Obesity   . OSA (obstructive sleep apnea)     poor compliance with cpap  . CHF (congestive heart failure)   . Biventricular failure     compensated  . Psoriasis   . Stasis edema     of lower extremities  . Depression   . Situational stress   . Kidney stones   . Kidney disease, chronic, stage III (GFR 30-59 ml/min)   . Anemia   . Gout   . Chronic anticoagulation   . Yeast infection involving the vagina and surrounding area   . Atrial fibrillation with RVR   . Stroke     denies deficits   Past Surgical History  Procedure Laterality Date  . Appendectomy    . Cholecystectomy    . Total knee arthroplasty    . Total abdominal hysterectomy    . Back surgery    . Cystectomy      left hand   Social History:  reports that she quit smoking about 31 years ago. Her smoking use included Cigarettes. She smoked 0.00 packs per day. She has never used smokeless tobacco. She reports that she does not drink alcohol or use illicit drugs.  married. Lives with spouse. Ambulates with the help of a walker. On home or 2 at bedtime and when necessary during the day.   Allergies  Allergen Reactions  . Daptomycin Other (See Comments)    Elevated CPK  . Morphine And Related Nausea And Vomiting  . Cephalexin Rash    unknown  .  Celecoxib     unknown  . Codeine     unknown  . Fluoxetine Hcl     unknown  . Latex     REACTION: undefined  . Ofloxacin     unknown  . Penicillins     unknown  . Rofecoxib     unknown  . Sulfonamide Derivatives     unknown    Family History  Problem Relation Age of Onset  . Heart attack Father   . Asthma Father   . Heart disease Paternal Uncle   . Rectal cancer Paternal Aunt   . Other Mother     mva    Prior to Admission medications   Medication Sig Start Date End Date Taking?  Authorizing Provider  albuterol (PROVENTIL HFA;VENTOLIN HFA) 108 (90 BASE) MCG/ACT inhaler Inhale 2 puffs into the lungs every 6 (six) hours as needed for wheezing or shortness of breath.    Yes Historical Provider, MD  allopurinol (ZYLOPRIM) 100 MG tablet Take 100 mg by mouth daily.  09/16/11  Yes Historical Provider, MD  amiodarone (PACERONE) 200 MG tablet Take 200 mg by mouth daily.   Yes Historical Provider, MD  amLODipine (NORVASC) 10 MG tablet Take 10 mg by mouth daily.   Yes Historical Provider, MD  atorvastatin (LIPITOR) 20 MG tablet Take 20 mg by mouth at bedtime.   Yes Historical Provider, MD  ferrous sulfate 325 (65 FE) MG tablet Take 325 mg by mouth 3 (three) times daily with meals. 08/29/12  Yes Gwenyth Bender, MD  furosemide (LASIX) 40 MG tablet Take 40 mg by mouth 2 (two) times daily.   Yes Historical Provider, MD  hydrALAZINE (APRESOLINE) 50 MG tablet Take 50 mg by mouth 3 (three) times daily.   Yes Historical Provider, MD  insulin aspart (NOVOLOG) 100 UNIT/ML injection Inject 0-20 Units into the skin 3 (three) times daily before meals. Per sliding scale   Yes Historical Provider, MD  insulin glargine (LANTUS) 100 UNIT/ML injection Inject 20 Units into the skin 2 (two) times daily.   Yes Historical Provider, MD  levofloxacin (LEVAQUIN) 250 MG tablet Take 250 mg by mouth daily.   Yes Historical Provider, MD  oxyCODONE-acetaminophen (PERCOCET/ROXICET) 5-325 MG per tablet Take 1 tablet by mouth every 8 (eight) hours as needed for pain.   Yes Historical Provider, MD  pantoprazole (PROTONIX) 40 MG tablet Take 40 mg by mouth daily.   Yes Historical Provider, MD  spironolactone (ALDACTONE) 25 MG tablet Take 25 mg by mouth 2 (two) times daily.    Yes Historical Provider, MD  warfarin (COUMADIN) 5 MG tablet Take 10 mg by mouth daily.   Yes Historical Provider, MD  zolpidem (AMBIEN) 5 MG tablet Take 5 mg by mouth at bedtime as needed for sleep.    Yes Historical Provider, MD   Physical  Exam: Filed Vitals:   08/03/13 1135 08/03/13 1311 08/03/13 1458 08/03/13 1648  BP: 182/59 147/56  164/75  Pulse: 73 74  76  Temp:    98 F (36.7 C)  TempSrc:    Oral  Resp: 18 22  22   Weight:    121.6 kg (268 lb 1.3 oz)  SpO2: 98% 97% 98% 99%   Patient was examined with a female nurse in the room.  General exam:  moderately built, morbidly obese, chronically ill-looking female patient lying slightly propped up in bed without distress.   Head, eyes and ENT: Nontraumatic and normocephalic. Pupils equally reacting to light and accommodation. Oral mucosa  moist.  Neck: Supple. No JVD, carotid bruit or thyromegaly.  Lymphatics: No lymphadenopathy.  Respiratory system:  reduced breath sounds in the bases with few basal crackles. Rest of lung fields are clear to auscultation . No increased work of breathing.  Cardiovascular system: S1 and S2 heard, RRR. No JVD, murmurs, gallops, clicks. See below for leg exam.  Gastrointestinal system: Abdomen is  OP/pannus , soft and nontender. Normal bowel sounds heard. No organomegaly or masses appreciated.  Central nervous system: Alert and oriented. No focal neurological deficits.  Extremities: Symmetric 5 x 5 power. bilateral lower extremities with extensive chronic changes: Left leg to below the knee has hyperpigmentation, lichenification, chronic edema. Left knee is mildly swollen compared to the right and slightly warm but not tender. Painful range of motion. Her right lower extremity with similar but less severe skin changes.   Skin:  Extensive fungal infection rash underneath bilateral breasts, groin, external genitalia and gluteal cleft. Patient also has extensive psoriasis lesions on back and left upper extremity.   Musculoskeletal system: Negative exam.  Psychiatry: Pleasant and cooperative.   Labs on Admission:  Basic Metabolic Panel:  Recent Labs Lab 08/03/13 1122  NA 142  K 4.8  CL 108  CO2 25  GLUCOSE 94  BUN 33*   CREATININE 1.92*  CALCIUM 9.0   Liver Function Tests: No results found for this basename: AST, ALT, ALKPHOS, BILITOT, PROT, ALBUMIN,  in the last 168 hours No results found for this basename: LIPASE, AMYLASE,  in the last 168 hours No results found for this basename: AMMONIA,  in the last 168 hours CBC:  Recent Labs Lab 08/03/13 1122  WBC 8.6  NEUTROABS 7.5  HGB 7.5*  HCT 25.0*  MCV 71.8*  PLT 346   Cardiac Enzymes:  Recent Labs Lab 08/03/13 1122  TROPONINI <0.30    BNP (last 3 results)  Recent Labs  02/15/13 0434 07/05/13 1536 08/03/13 1122  PROBNP 438.6* 1581.0* 1789.0*   CBG:  Recent Labs Lab 08/03/13 1626  GLUCAP 146*    Radiological Exams on Admission: Dg Chest 1 View  08/03/2013   CLINICAL DATA:  shortness of breath, weakness. Left knee swelling.  EXAM: CHEST - 1 VIEW  COMPARISON:  07/05/2013  FINDINGS: Mild cardiomegaly with vascular congestion. Mild right infrahilar airspace opacity, atelectasis versus infiltrate. No confluent opacity on the left. No visible effusions. No overt edema or acute bony abnormality.  IMPRESSION: Cardiomegaly, vascular congestion.  Right infrahilar atelectasis or infiltrate.   Electronically Signed   By: Charlett Nose   On: 08/03/2013 13:14   Dg Knee Complete 4 Views Left  08/03/2013   CLINICAL DATA:  Shortness of breath, weakness.  EXAM: LEFT KNEE - COMPLETE 4+ VIEW  COMPARISON:  06/09/2012  FINDINGS: Changes of left knee replacement. No joint effusion. No acute fracture, subluxation or dislocation.  IMPRESSION: Left knee replacement. No complicating feature.   Electronically Signed   By: Charlett Nose   On: 08/03/2013 13:16    EKG: Independently reviewed.  Sinus rhythm without acute changes. QTC 438 ms.   Assessment/Plan Active Problems:   HYPERLIPIDEMIA   OBSTRUCTIVE SLEEP APNEA   HYPERTENSION   Obesity hypoventilation syndrome   Prosthetic joint infection   Gout   DM (diabetes mellitus)   Chronic anticoagulation    Chronic venous insufficiency   CKD (chronic kidney disease), stage IV   Anemia of chronic disease   PAF (paroxysmal atrial fibrillation)   Psoriasis   Morbid obesity   HCAP (healthcare-associated  pneumonia)   Systolic and diastolic CHF, acute on chronic   1. Possible healthcare associated pneumonia: IV vancomycin & aztreonam per pharmacy. Patient has multiple allergies. 2. Acute on chronic combined CHF: Change Lasix to IV. Continue Aldactone. Daily weights and strict intake and outputs. 3. Left knee pain/prosthetic left knee infection on suppressive antibiotics: No significant acute findings on left knee exam. X-ray unremarkable. Hold Levaquin since patient will be on aztreonam. Consider infectious disease consultation in a.m. PT and OT evaluation. 4. Stage IV chronic kidney disease: Creatinine seems to be at baseline. Monitor closely while on IV diuresis. 5. Microcytic anemia: Secondary to chronic disease/chronic kidney disease. Stable. Follow daily CBCs. 6. Paroxysmal A. fib: Currently in sinus rhythm. INR 3.14. Coumadin per pharmacy. 7. Dyspnea/acute on chronic respiratory failure: Secondary to pneumonia, decompensated CHF, OHS/OSA. Management as above. Oxygen. 8. Hypertension: Mildly uncontrolled. Continue home medications. 9. Type II DM/IDDM: Continue Lantus and add SSI. 10. Extensive fungal skin infection: Diflucan per pharmacy. 11. Bilateral lower extremity venous stasis,? Complicated by psoriasis: No features of cellulitis. We'll get wound care consultation. 12. Psoriasis: Outpatient followup with dermatology. Does not seem to be on any medications for same.      Code Status: Full  Family Communication: None  Disposition Plan: Home when medically stable   Time spent: 65 minutes  [facility] Pager [phone]  If 7PM-7AM, please contact night-coverage [email or web address] Password [facility] 08/03/2013, 5:18 PM

## 2013-08-03 NOTE — Progress Notes (Signed)
ANTIBIOTIC / Anticoagulation CONSULT NOTE - INITIAL  Pharmacy Consult for Vancomycin, Aztreonam, Fluconazole, Warfarin Indication: rule out pneumonia / PAF  Allergies  Allergen Reactions  . Daptomycin Other (See Comments)    Elevated CPK  . Morphine And Related Nausea And Vomiting  . Cephalexin Rash    unknown  . Celecoxib     unknown  . Codeine     unknown  . Fluoxetine Hcl     unknown  . Latex     REACTION: undefined  . Ofloxacin     unknown  . Penicillins     unknown  . Rofecoxib     unknown  . Sulfonamide Derivatives     unknown    Patient Measurements: Height: 5\' 3"  (160 cm) Weight: 268 lb 1.3 oz (121.6 kg) IBW/kg (Calculated) : 52.4   Vital Signs: Temp: 98 F (36.7 C) (08/26 1648) Temp src: Oral (08/26 1648) BP: 164/75 mmHg (08/26 1648) Pulse Rate: 76 (08/26 1648) Intake/Output from previous day:   Intake/Output from this shift: Total I/O In: -  Out: 100 [Urine:100]  Labs:  Recent Labs  08/03/13 1122  WBC 8.6  HGB 7.5*  PLT 346  CREATININE 1.92*   Estimated Creatinine Clearance: 37.9 ml/min (by C-G formula based on Cr of 1.92). No results found for this basename: VANCOTROUGH, Leodis Binet, VANCORANDOM, GENTTROUGH, GENTPEAK, GENTRANDOM, TOBRATROUGH, TOBRAPEAK, TOBRARND, AMIKACINPEAK, AMIKACINTROU, AMIKACIN,  in the last 72 hours   Microbiology: Recent Results (from the past 720 hour(s))  MRSA PCR SCREENING     Status: Abnormal   Collection Time    07/05/13 11:25 PM      Result Value Range Status   MRSA by PCR POSITIVE (*) NEGATIVE Final   Comment:            The GeneXpert MRSA Assay (FDA     approved for NASAL specimens     only), is one component of a     comprehensive MRSA colonization     surveillance program. It is not     intended to diagnose MRSA     infection nor to guide or     monitor treatment for     MRSA infections.     RESULT CALLED TO, READ BACK BY AND VERIFIED WITH:     CALLED TO RN Carleene Cooper [id number] @0559  THANEY   MRSA PCR SCREENING     Status: Abnormal   Collection Time    08/03/13  4:56 PM      Result Value Range Status   MRSA by PCR POSITIVE (*) NEGATIVE Final   Comment:            The GeneXpert MRSA Assay (FDA     approved for NASAL specimens     only), is one component of a     comprehensive MRSA colonization     surveillance program. It is not     intended to diagnose MRSA     infection nor to guide or     monitor treatment for     MRSA infections.     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 Lab error 8/26 Aztreonam 2gm q8 >> 8/26 vanc 2.5Gm x1 then 1500 q24>> 8/26 diflucan 400mg  x1 then 200 q24>>  CV: 160/75, HR 70 sr PAF amio, HTN amlo, HLD atorv,  HF pulm edema on Cxr - iv furos 60bid, hydral 50, spiro 25bid - this is home dose- should not be used in Cr > 2.5 so watch,   Endo: gout- allopurinol   GI/Nutr:  ppi po, fe    Goal of Therapy:  Vancomycin trough level 15-20 mcg/ml INR 2-3  Plan:  Aztreonam 2gm q8 Vancomycin 2.5Gm x1 then 1500 q24 Fluconazole 400mg  x1 then 200 q24 Warfarin 2.5mg  x1 tonight Daily Protime  Leota Sauers Pharm.D. CPP, BCPS Clinical Pharmacist [phone] 08/03/2013 6:45 PM

## 2013-08-03 NOTE — ED Provider Notes (Signed)
CSN: [id number]     Arrival date & time 08/03/13  1015 History   First MD Initiated Contact with Patient 08/03/13 1036     No chief complaint on file.  (Consider location/radiation/quality/duration/timing/severity/associated sxs/prior Treatment) Patient is a 62 y.o. female presenting with knee pain and shortness of breath. The history is provided by the patient. No language interpreter was used.  Knee Pain Location:  Knee Injury: no   Pain details:    Quality:  Aching   Radiates to:  Does not radiate   Severity:  Moderate Relieved by: She took a Microbiologist with some relief.  Associated symptoms: no fever   Associated symptoms comment:  Left knee pain for the last several days with history of the same. She reports previous and chronic infection for which she takes Levaquin, as well as medication for gout. She denies falls and reports she is able to ambulate with a walker.  Shortness of Breath Severity:  Moderate Onset quality:  Gradual Duration:  4 days Associated symptoms: cough   Associated symptoms: no abdominal pain, no chest pain, no fever, no headaches and no vomiting   Associated symptoms comment:  She has shortness of breath over baseline, requiring increased day time use of her home oxygen for the past 4 days. SOB is associated with a cough. She denies chest pain, N, V.    Past Medical History  Diagnosis Date  . Asthma   . Bronchitis   . Allergic rhinitis   . Diabetes mellitus   . HTN (hypertension)   . DJD (degenerative joint disease)   . Hyperlipidemia   . Complication of anesthesia     trouble waking up  . Obesity   . OSA (obstructive sleep apnea)     poor compliance with cpap  . CHF (congestive heart failure)   . Biventricular failure     compensated  . Psoriasis   . Stasis edema     of lower extremities  . Depression   . Situational stress   . Kidney stones   . Kidney disease, chronic, stage III (GFR 30-59 ml/min)   . Anemia   . Gout   . Chronic  anticoagulation   . Yeast infection involving the vagina and surrounding area   . Atrial fibrillation with RVR   . Stroke     denies deficits   Past Surgical History  Procedure Laterality Date  . Appendectomy    . Cholecystectomy    . Total knee arthroplasty    . Total abdominal hysterectomy    . Back surgery    . Cystectomy      left hand   Family History  Problem Relation Age of Onset  . Heart attack Father   . Asthma Father   . Heart disease Paternal Uncle   . Rectal cancer Paternal Aunt   . Other Mother     mva   History  Substance Use Topics  . Smoking status: Former Smoker    Types: Cigarettes    Quit date: 07/05/1982  . Smokeless tobacco: Never Used     Comment: smoked for 1.5 yrs about 2 cigs a day ,only if stressed  . Alcohol Use: No   OB History   Grav Para Term Preterm Abortions TAB SAB Ect Mult Living                 Review of Systems  Constitutional: Negative for fever and chills.  HENT: Negative.   Respiratory: Positive for cough and shortness  of breath.   Cardiovascular: Negative.  Negative for chest pain.  Gastrointestinal: Negative.  Negative for nausea, vomiting and abdominal pain.  Genitourinary: Negative.  Negative for dysuria.  Musculoskeletal:       See HPI.  Skin: Negative.   Neurological: Negative.  Negative for weakness and headaches.  Psychiatric/Behavioral: Negative.  Negative for confusion.    Allergies  Daptomycin; Morphine and related; Cephalexin; Celecoxib; Codeine; Fluoxetine hcl; Latex; Ofloxacin; Penicillins; Rofecoxib; and Sulfonamide derivatives  Home Medications   Current Outpatient Rx  Name  Route  Sig  Dispense  Refill  . albuterol (PROVENTIL HFA;VENTOLIN HFA) 108 (90 BASE) MCG/ACT inhaler   Inhalation   Inhale 2 puffs into the lungs every 6 (six) hours as needed for wheezing or shortness of breath.          . allopurinol (ZYLOPRIM) 100 MG tablet   Oral   Take 100 mg by mouth daily.          [name] amiodarone  (PACERONE) 200 MG tablet   Oral   Take 200 mg by mouth daily.         [name] amLODipine (NORVASC) 10 MG tablet   Oral   Take 10 mg by mouth daily.         [name] atorvastatin (LIPITOR) 20 MG tablet   Oral   Take 20 mg by mouth at bedtime.         . ferrous sulfate 325 (65 FE) MG tablet   Oral   Take 325 mg by mouth 3 (three) times daily with meals.         . furosemide (LASIX) 40 MG tablet   Oral   Take 40 mg by mouth 2 (two) times daily.         . hydrALAZINE (APRESOLINE) 50 MG tablet   Oral   Take 50 mg by mouth 3 (three) times daily.         . insulin aspart (NOVOLOG) 100 UNIT/ML injection   Subcutaneous   Inject 0-20 Units into the skin 3 (three) times daily before meals. Per sliding scale         . insulin glargine (LANTUS) 100 UNIT/ML injection   Subcutaneous   Inject 20 Units into the skin 2 (two) times daily.         [name] levofloxacin (LEVAQUIN) 250 MG tablet   Oral   Take 250 mg by mouth daily.         [name] oxyCODONE-acetaminophen (PERCOCET/ROXICET) 5-325 MG per tablet   Oral   Take 1 tablet by mouth every 8 (eight) hours as needed for pain.         . pantoprazole (PROTONIX) 40 MG tablet   Oral   Take 40 mg by mouth daily.         [name] spironolactone (ALDACTONE) 25 MG tablet   Oral   Take 25 mg by mouth 2 (two) times daily.          [name] warfarin (COUMADIN) 5 MG tablet   Oral   Take 10 mg by mouth daily.         [name] zolpidem (AMBIEN) 5 MG tablet   Oral   Take 5 mg by mouth at bedtime as needed for sleep.           BP 182/59  Pulse 73  Temp(Src) 98.3 F (36.8 C) (Oral)  Resp 18  SpO2 98% Physical Exam  Constitutional: She is oriented to person, place, and time. She appears well-developed and  well-nourished. No distress.  HENT:  Head: Normocephalic.  Neck: Normal range of motion. Neck supple.  Cardiovascular: Normal rate and regular rhythm.   Pulmonary/Chest: She has no wheezes. She has rales. She exhibits no tenderness.  She is  tachypnic with conversing.  Abdominal: Soft. Bowel sounds are normal. There is no tenderness. There is no rebound and no guarding.  Musculoskeletal: Normal range of motion.  Left lower extremity swollen with chronic hyperpigmented, thickened dermatologic changes. Knee has well healed midline scar and anterior tenderness. No redness, joint laxity, calf or thigh tenderness.   Neurological: She is alert and oriented to person, place, and time.  Skin: Skin is warm and dry. No rash noted.  Psychiatric: She has a normal mood and affect.    ED Course  Procedures (including critical care time) Labs Review Labs Reviewed  CBC WITH DIFFERENTIAL - Abnormal; Notable for the following:    RBC 3.48 (*)    Hemoglobin 7.5 (*)    HCT 25.0 (*)    MCV 71.8 (*)    MCH 21.6 (*)    RDW 21.3 (*)    Neutrophils Relative % 87 (*)    Lymphocytes Relative 7 (*)    Lymphs Abs 0.6 (*)    All other components within normal limits  BASIC METABOLIC PANEL - Abnormal; Notable for the following:    BUN 33 (*)    Creatinine, Ser 1.92 (*)    GFR calc non Af Amer 27 (*)    GFR calc Af Amer 31 (*)    All other components within normal limits  URINALYSIS, ROUTINE W REFLEX MICROSCOPIC - Abnormal; Notable for the following:    APPearance CLOUDY (*)    Hgb urine dipstick MODERATE (*)    Protein, ur 100 (*)    All other components within normal limits  PRO B NATRIURETIC PEPTIDE - Abnormal; Notable for the following:    Pro B Natriuretic peptide (BNP) 1789.0 (*)    All other components within normal limits  URINE MICROSCOPIC-ADD ON - Abnormal; Notable for the following:    Squamous Epithelial / LPF MANY (*)    All other components within normal limits  TROPONIN I   Imaging Review No results found. Dg Chest 1 View  08/03/2013   CLINICAL DATA:  shortness of breath, weakness. Left knee swelling.  EXAM: CHEST - 1 VIEW  COMPARISON:  07/05/2013  FINDINGS: Mild cardiomegaly with vascular congestion. Mild right infrahilar  airspace opacity, atelectasis versus infiltrate. No confluent opacity on the left. No visible effusions. No overt edema or acute bony abnormality.  IMPRESSION: Cardiomegaly, vascular congestion.  Right infrahilar atelectasis or infiltrate.   Electronically Signed   By: Charlett Nose   On: 08/03/2013 13:14   Dg Chest 2 View  07/05/2013   *RADIOLOGY REPORT*  Clinical Data: Shortness of breath  CHEST - 2 VIEW  Comparison: February 11, 2013  Findings: There is no edema or consolidation.  Heart is mildly enlarged with normal pulmonary vascularity.  No adenopathy.  There is degenerative change in the thoracic spine.  IMPRESSION: No edema or consolidation.  Cardiomegaly, stable.   Original Report Authenticated By: Bretta Bang, M.D.   Dg Knee Complete 4 Views Left  08/03/2013   CLINICAL DATA:  Shortness of breath, weakness.  EXAM: LEFT KNEE - COMPLETE 4+ VIEW  COMPARISON:  06/09/2012  FINDINGS: Changes of left knee replacement. No joint effusion. No acute fracture, subluxation or dislocation.  IMPRESSION: Left knee replacement. No complicating feature.   Electronically  Signed   By: Charlett Nose   On: 08/03/2013 13:16   MDM  No diagnosis found. 1. HCAP 2. Knee pain  CXR shows new infiltrates vs atx. Given cough, increasing oxygen demand and SOB, favor pna, and given recent hospitalization favor HCAP. She has been stable here, requires O2 at 2 L to maintain saturations above 93%. No chest pain at any time. Hgb 7.5 which is consistent with recent discharge Hgb. Appropriate abx therapy discussed with the pharmacy and recommendations were made to give increased dose of Levaquin to start. Vancomycin added at the request of admitting hospitalist after second consultation with pharmacy. Aztreonam felt high risk given abx allergies.    Arnoldo Hooker, PA-C 08/03/13 1528

## 2013-08-03 NOTE — Progress Notes (Signed)
Pt o4x. Pt complaining of lt leg itching . Severe according to Pt.  Apply aloe cream and rub leg. Stated after Kathlen Mody was started. MD aware . Will continue to monitor Pt

## 2013-08-04 ENCOUNTER — Other Ambulatory Visit: Payer: Self-pay

## 2013-08-04 ENCOUNTER — Encounter (HOSPITAL_COMMUNITY): Payer: Self-pay | Admitting: Internal Medicine

## 2013-08-04 DIAGNOSIS — Z96659 Presence of unspecified artificial knee joint: Secondary | ICD-10-CM

## 2013-08-04 DIAGNOSIS — R918 Other nonspecific abnormal finding of lung field: Secondary | ICD-10-CM

## 2013-08-04 DIAGNOSIS — T8450XA Infection and inflammatory reaction due to unspecified internal joint prosthesis, initial encounter: Secondary | ICD-10-CM

## 2013-08-04 LAB — PROTIME-INR: INR: 3.48 — ABNORMAL HIGH (ref 0.00–1.49)

## 2013-08-04 LAB — GLUCOSE, CAPILLARY
Glucose-Capillary: 99 mg/dL (ref 70–99)
Glucose-Capillary: 100 mg/dL — ABNORMAL HIGH (ref 70–99)
Glucose-Capillary: 93 mg/dL (ref 70–99)
Glucose-Capillary: 145 mg/dL — ABNORMAL HIGH (ref 70–99)

## 2013-08-04 LAB — CBC
MCH: 21.4 pg — ABNORMAL LOW (ref 26.0–34.0)
MCH: 23.1 pg — ABNORMAL LOW (ref 26.0–34.0)
MCHC: 29.3 g/dL — ABNORMAL LOW (ref 30.0–36.0)
MCHC: 30.7 g/dL (ref 30.0–36.0)
Platelets: 312 10*3/uL (ref 150–400)
Platelets: 312 10*3/uL (ref 150–400)

## 2013-08-04 LAB — BASIC METABOLIC PANEL
BUN: 31 mg/dL — ABNORMAL HIGH (ref 6–23)
Calcium: 8.5 mg/dL (ref 8.4–10.5)
GFR calc non Af Amer: 25 mL/min — ABNORMAL LOW (ref >90)
Glucose, Bld: 102 mg/dL — ABNORMAL HIGH (ref 70–99)

## 2013-08-04 LAB — HIV ANTIBODY (ROUTINE TESTING W REFLEX): HIV: NONREACTIVE

## 2013-08-04 LAB — LEGIONELLA ANTIGEN, URINE

## 2013-08-04 LAB — PREPARE RBC (CROSSMATCH)

## 2013-08-04 MED ORDER — PANTOPRAZOLE SODIUM 40 MG PO TBEC
40.0000 mg | DELAYED_RELEASE_TABLET | Freq: Every day | ORAL | Status: DC
  Administered 2013-08-04 – 2013-08-08 (×5): 40 mg via ORAL
  Filled 2013-08-04 (×5): qty 1

## 2013-08-04 MED ORDER — MUPIROCIN 2 % EX OINT
TOPICAL_OINTMENT | Freq: Two times a day (BID) | CUTANEOUS | Status: DC
  Administered 2013-08-04 – 2013-08-08 (×9): via NASAL
  Filled 2013-08-04 (×2): qty 22

## 2013-08-04 MED ORDER — CHLORHEXIDINE GLUCONATE CLOTH 2 % EX PADS
6.0000 | MEDICATED_PAD | Freq: Every day | CUTANEOUS | Status: DC
  Administered 2013-08-04 – 2013-08-08 (×5): 6 via TOPICAL

## 2013-08-04 NOTE — Progress Notes (Signed)
ANTICOAGULATION CONSULT NOTE - Follow Up Consult  Pharmacy Consult for Coumadin Indication: atrial fibrillation  Allergies  Allergen Reactions  . Daptomycin Other (See Comments)    Elevated CPK  . Morphine And Related Nausea And Vomiting  . Cephalexin Rash    unknown  . Celecoxib     unknown  . Codeine     unknown  . Fluoxetine Hcl     unknown  . Latex     REACTION: undefined  . Ofloxacin     unknown  . Penicillins     unknown  . Rofecoxib     unknown  . Sulfonamide Derivatives     unknown    Patient Measurements: Height: 5\' 3"  (160 cm) Weight: 274 lb 7.6 oz (124.5 kg) (no standing wt) IBW/kg (Calculated) : 52.4  Vital Signs: Temp: 98.2 F (36.8 C) (08/27 0359) Temp src: Oral (08/27 0359) BP: 174/60 mmHg (08/27 1024) Pulse Rate: 72 (08/27 1024)  Labs:  Recent Labs  08/03/13 1122 08/03/13 1404 08/04/13 0713  HGB 7.5*  --  7.1*  HCT 25.0*  --  24.2*  PLT 346  --  312  APTT  --  73*  --   LABPROT  --  31.1* 33.7*  INR  --  3.14* 3.48*  CREATININE 1.92*  --  2.01*  TROPONINI <0.30  --   --     Estimated Creatinine Clearance: 36.7 ml/min (by C-G formula based on Cr of 2.01).   Assessment: 62 y/o female on chronic Coumadin for Afib, admitted with worsening L knee pain and dyspnea.  INR is supratherapeutic at 3.48. No overt bleeding noted however Hb is low at 7.1, platelets are wnl. Plan is to transfuse 2 units PRBC today.  Home dose is 10 mg daily.  Goal of Therapy:  INR 2-3 Monitor platelets by anticoagulation protocol: Yes   Plan:  -No Coumadin tonight -INR daily -Monitor for s/sx of bleeding  [facility], [address].D., BCPS Clinical Pharmacist Pager: [phone] 08/04/2013 11:54 AM

## 2013-08-04 NOTE — Consult Note (Signed)
Reason for Consult:  Chronic left knee pain with chronically infected total joint - recs Referring Physician:   [facility]  Nancy L Sanks is an 62 y.o. female.  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  She has been under chronic antibiotic suppression.  The group originally taking care of her did refer her to [facility] due to the complicated nature of her knee.  They did recommend the possibility of a 2-stage revision and the possibility of an amputation.  I think the only thing she heard at [facility] was "amputation" and never followed up again.  She was readmitted recently due to anemia, SOB and left knee pain.  Since I am on un-assigned Ortho call, I was asked to see her as a consultation for recommendations.    Past Medical History  Diagnosis Date  . Asthma   . Bronchitis   . Allergic rhinitis   . Diabetes mellitus   . HTN (hypertension)   . DJD (degenerative joint disease)   . Hyperlipidemia   . Complication of anesthesia     trouble waking up  . Obesity   . OSA (obstructive sleep apnea)     poor compliance with cpap  . CHF (congestive heart failure)   . Biventricular failure     compensated  . Psoriasis   . Stasis edema     of lower extremities  . Depression   . Situational stress   . Kidney stones   . Kidney disease, chronic, stage III (GFR 30-59 ml/min)   . Anemia   . Gout   . Chronic anticoagulation   . Yeast infection involving the vagina and surrounding area   . Atrial fibrillation with RVR   . Stroke     denies deficits  . GERD (gastroesophageal reflux disease)   . Pneumonia 08/03/2013    Past Surgical History  Procedure Laterality Date  . Appendectomy    . Cholecystectomy    . Total knee arthroplasty    . Total abdominal hysterectomy    . Back surgery    . Cystectomy      left hand    Family History    Problem Relation Age of Onset  . Heart attack Father   . Asthma Father   . Heart disease Paternal Uncle   . Rectal cancer Paternal Aunt   . Other Mother     mva    Social History:  reports that she quit smoking about 31 years ago. Her smoking use included Cigarettes. She smoked 0.00 packs per day. She has never used smokeless tobacco. She reports that she does not drink alcohol or use illicit drugs.  Allergies:  Allergies  Allergen Reactions  . Daptomycin Other (See Comments)    Elevated CPK  . Morphine And Related Nausea And Vomiting  . Cephalexin Rash    unknown  . Celecoxib     unknown  . Codeine     unknown  . Fluoxetine Hcl     unknown  . Latex     REACTION: undefined  . Ofloxacin     unknown  . Penicillins     unknown  . Rofecoxib     unknown  . Sulfonamide Derivatives     unknown    Medications: I have reviewed the patient's current medications.  Results for orders placed during the hospital encounter of 08/03/13 (from the  past 48 hour(s))  CBC WITH DIFFERENTIAL     Status: Abnormal   Collection Time    08/03/13 11:22 AM      Result Value Range   WBC 8.6  4.0 - 10.5 K/uL   RBC 3.48 (*) 3.87 - 5.11 MIL/uL   Hemoglobin 7.5 (*) 12.0 - 15.0 g/dL   HCT 96.0 (*) 45.4 - 09.8 %   MCV 71.8 (*) 78.0 - 100.0 fL   MCH 21.6 (*) 26.0 - 34.0 pg   MCHC 30.0  30.0 - 36.0 g/dL   RDW 11.9 (*) 14.7 - 82.9 %   Platelets 346  150 - 400 K/uL   Neutrophils Relative % 87 (*) 43 - 77 %   Lymphocytes Relative 7 (*) 12 - 46 %   Monocytes Relative 5  3 - 12 %   Eosinophils Relative 1  0 - 5 %   Basophils Relative 0  0 - 1 %   Neutro Abs 7.5  1.7 - 7.7 K/uL   Lymphs Abs 0.6 (*) 0.7 - 4.0 K/uL   Monocytes Absolute 0.4  0.1 - 1.0 K/uL   Eosinophils Absolute 0.1  0.0 - 0.7 K/uL   Basophils Absolute 0.0  0.0 - 0.1 K/uL  BASIC METABOLIC PANEL     Status: Abnormal   Collection Time    08/03/13 11:22 AM      Result Value Range   Sodium 142  135 - 145 mEq/L   Potassium 4.8  3.5  - 5.1 mEq/L   Chloride 108  96 - 112 mEq/L   CO2 25  19 - 32 mEq/L   Glucose, Bld 94  70 - 99 mg/dL   BUN 33 (*) 6 - 23 mg/dL   Creatinine, Ser 5.62 (*) 0.50 - 1.10 mg/dL   Calcium 9.0  8.4 - 13.0 mg/dL   GFR calc non Af Amer 27 (*) >90 mL/min   GFR calc Af Amer 31 (*) >90 mL/min   Comment: (NOTE)     The eGFR has been calculated using the CKD EPI equation.     This calculation has not been validated in all clinical situations.     eGFR's persistently <90 mL/min signify possible Chronic Kidney     Disease.  PRO B NATRIURETIC PEPTIDE     Status: Abnormal   Collection Time    08/03/13 11:22 AM      Result Value Range   Pro B Natriuretic peptide (BNP) 1789.0 (*) 0 - 125 pg/mL  TROPONIN I     Status: None   Collection Time    08/03/13 11:22 AM      Result Value Range   Troponin I <0.30  <0.30 ng/mL   Comment:            Due to the release kinetics of cTnI,     a negative result within the first hours     of the onset of symptoms does not rule out     myocardial infarction with certainty.     If myocardial infarction is still suspected,     repeat the test at appropriate intervals.  URINALYSIS, ROUTINE W REFLEX MICROSCOPIC     Status: Abnormal   Collection Time    08/03/13 11:35 AM      Result Value Range   Color, Urine YELLOW  YELLOW   APPearance CLOUDY (*) CLEAR   Specific Gravity, Urine 1.014  1.005 - 1.030   pH 6.0  5.0 - 8.0   Glucose,  UA NEGATIVE  NEGATIVE mg/dL   Hgb urine dipstick MODERATE (*) NEGATIVE   Bilirubin Urine NEGATIVE  NEGATIVE   Ketones, ur NEGATIVE  NEGATIVE mg/dL   Protein, ur 161 (*) NEGATIVE mg/dL   Urobilinogen, UA 0.2  0.0 - 1.0 mg/dL   Nitrite NEGATIVE  NEGATIVE   Leukocytes, UA NEGATIVE  NEGATIVE  URINE MICROSCOPIC-ADD ON     Status: Abnormal   Collection Time    08/03/13 11:35 AM      Result Value Range   Squamous Epithelial / LPF MANY (*) RARE   WBC, UA 0-2  <3 WBC/hpf   RBC / HPF 3-6  <3 RBC/hpf   Bacteria, UA RARE  RARE   Urine-Other  AMORPHOUS URATES/PHOSPHATES    PROTIME-INR     Status: Abnormal   Collection Time    08/03/13  2:04 PM      Result Value Range   Prothrombin Time 31.1 (*) 11.6 - 15.2 seconds   INR 3.14 (*) 0.00 - 1.49  APTT     Status: Abnormal   Collection Time    08/03/13  2:04 PM      Result Value Range   aPTT 73 (*) 24 - 37 seconds   Comment:            IF BASELINE aPTT IS ELEVATED,     SUGGEST PATIENT RISK ASSESSMENT     BE USED TO DETERMINE APPROPRIATE     ANTICOAGULANT THERAPY.  TYPE AND SCREEN     Status: None   Collection Time    08/03/13  2:47 PM      Result Value Range   ABO/RH(D) A POS     Antibody Screen NEG     Sample Expiration 08/06/2013     Unit Number [id number]     Blood Component Type RED CELLS,LR     Unit division 00     Status of Unit ISSUED     Transfusion Status OK TO TRANSFUSE     Crossmatch Result Compatible     Unit Number [id number]     Blood Component Type RED CELLS,LR     Unit division 00     Status of Unit ISSUED     Transfusion Status OK TO TRANSFUSE     Crossmatch Result Compatible    GLUCOSE, CAPILLARY     Status: Abnormal   Collection Time    08/03/13  4:26 PM      Result Value Range   Glucose-Capillary 146 (*) 70 - 99 mg/dL  MRSA PCR SCREENING     Status: Abnormal   Collection Time    08/03/13  4:56 PM      Result Value Range   MRSA by PCR POSITIVE (*) NEGATIVE   Comment:            The GeneXpert MRSA Assay (FDA     approved for NASAL specimens     only), is one component of a     comprehensive MRSA colonization     surveillance program. It is not     intended to diagnose MRSA     infection nor to guide or     monitor treatment for     MRSA infections.     RESULT CALLED TO, READ BACK BY AND VERIFIED WITH:     Dorthey Sawyer RN 18:45 08/03/13 ([email or web address])  HIV ANTIBODY (ROUTINE TESTING)     Status: None   Collection Time    08/03/13  5:47 PM  Result Value Range   HIV NON REACTIVE  NON REACTIVE   Comment: Performed at [facility]  LEGIONELLA ANTIGEN, URINE     Status: None   Collection Time    08/03/13  6:46 PM      Result Value Range   Specimen Description URINE, CLEAN CATCH     Special Requests NONE     Legionella Antigen, Urine       Value: Negative for Legionella pneumophilia serogroup 1     Performed at [facility]   Report Status 08/04/2013 FINAL    STREP PNEUMONIAE URINARY ANTIGEN     Status: None   Collection Time    08/03/13  6:46 PM      Result Value Range   Strep Pneumo Urinary Antigen NEGATIVE  NEGATIVE   Comment:            Infection due to S. pneumoniae     cannot be absolutely ruled out     since the antigen present     may be below the detection limit     of the test.  GLUCOSE, CAPILLARY     Status: Abnormal   Collection Time    08/03/13  9:30 PM      Result Value Range   Glucose-Capillary 170 (*) 70 - 99 mg/dL   Comment 1 Notify RN    GLUCOSE, CAPILLARY     Status: None   Collection Time    08/04/13  6:13 AM      Result Value Range   Glucose-Capillary 99  70 - 99 mg/dL  BASIC METABOLIC PANEL     Status: Abnormal   Collection Time    08/04/13  7:13 AM      Result Value Range   Sodium 141  135 - 145 mEq/L   Potassium 4.8  3.5 - 5.1 mEq/L   Chloride 107  96 - 112 mEq/L   CO2 26  19 - 32 mEq/L   Glucose, Bld 102 (*) 70 - 99 mg/dL   BUN 31 (*) 6 - 23 mg/dL   Creatinine, Ser 4.78 (*) 0.50 - 1.10 mg/dL   Calcium 8.5  8.4 - 29.5 mg/dL   GFR calc non Af Amer 25 (*) >90 mL/min   GFR calc Af Amer 29 (*) >90 mL/min   Comment: (NOTE)     The eGFR has been calculated using the CKD EPI equation.     This calculation has not been validated in all clinical situations.     eGFR's persistently <90 mL/min signify possible Chronic Kidney     Disease.  CBC     Status: Abnormal   Collection Time    08/04/13  7:13 AM      Result Value Range   WBC 8.1  4.0 - 10.5 K/uL   RBC 3.32 (*) 3.87 - 5.11 MIL/uL   Hemoglobin 7.1 (*) 12.0 - 15.0 g/dL   HCT 62.1 (*) 30.8 - 65.7 %   MCV  72.9 (*) 78.0 - 100.0 fL   MCH 21.4 (*) 26.0 - 34.0 pg   MCHC 29.3 (*) 30.0 - 36.0 g/dL   RDW 84.6 (*) 96.2 - 95.2 %   Platelets 312  150 - 400 K/uL  PROTIME-INR     Status: Abnormal   Collection Time    08/04/13  7:13 AM      Result Value Range   Prothrombin Time 33.7 (*) 11.6 - 15.2 seconds   INR 3.48 (*) 0.00 -  1.49  PREPARE RBC (CROSSMATCH)     Status: None   Collection Time    08/04/13 10:12 AM      Result Value Range   Order Confirmation ORDER PROCESSED BY BLOOD BANK    GLUCOSE, CAPILLARY     Status: Abnormal   Collection Time    08/04/13 11:19 AM      Result Value Range   Glucose-Capillary 100 (*) 70 - 99 mg/dL  GLUCOSE, CAPILLARY     Status: None   Collection Time    08/04/13  4:25 PM      Result Value Range   Glucose-Capillary 93  70 - 99 mg/dL   Comment 1 Notify RN      Dg Chest 1 View  08/03/2013   CLINICAL DATA:  shortness of breath, weakness. Left knee swelling.  EXAM: CHEST - 1 VIEW  COMPARISON:  07/05/2013  FINDINGS: Mild cardiomegaly with vascular congestion. Mild right infrahilar airspace opacity, atelectasis versus infiltrate. No confluent opacity on the left. No visible effusions. No overt edema or acute bony abnormality.  IMPRESSION: Cardiomegaly, vascular congestion.  Right infrahilar atelectasis or infiltrate.   Electronically Signed   By: Charlett Nose   On: 08/03/2013 13:14   Dg Knee Complete 4 Views Left  08/03/2013   CLINICAL DATA:  Shortness of breath, weakness.  EXAM: LEFT KNEE - COMPLETE 4+ VIEW  COMPARISON:  06/09/2012  FINDINGS: Changes of left knee replacement. No joint effusion. No acute fracture, subluxation or dislocation.  IMPRESSION: Left knee replacement. No complicating feature.   Electronically Signed   By: Charlett Nose   On: 08/03/2013 13:16    ROS Blood pressure 138/50, pulse 72, temperature 98.4 F (36.9 C), temperature source Oral, resp. rate 22, height 5\' 3"  (1.6 m), weight 124.5 kg (274 lb 7.6 oz), SpO2 100.00%. Physical Exam    Musculoskeletal:       Left knee: Tenderness found. Medial joint line and lateral joint line tenderness noted.       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 Daiva Eves in terms of keeping her off of antibiotics for several days and then performing an aspiration of the knee.  I do think the components are chronically loose and this is causing her pain.  This is quite a difficult situation.  The only options other than suppressive antibiotics would be an excision arthroplasty and possibly 2-stage revision vs an amputation.  Right know, she is not septic appearing and the knee shows no worrisome signs of being in need of an urgent operation.  I don't think she could even tolerate surgery from a medical standpoint.  After I spoke with her, she said that her knee has not been hurting worse and that she can walk on it.  She says she is not interested in surgery right know either and that she does feel better getting a transfusion.  I'll continue to follow her while she is here.  2)  She can be up from my standpoint with physical therapy for balance, gait-training, mobility, and WBAT left knee.     Kathryne Hitch 08/04/2013, 6:31 PM

## 2013-08-04 NOTE — Consult Note (Signed)
WOC consult Note Reason for Consult: Patchy, non-intact lesions to back, arms and in skin folds, breast and between legs.  Wound type: Back and buttock lesions appear to be a dermatologic condition, such as Psoriasis.  This is beyond [facility].   Patient has Dermatologist appointment on 08/12/13.  Skin folds under breast and between legs, in groin, appear to be moisture related.   Measurement: Scattered areas on back and buttock.  Beneath breasts and in groin area.  Wound bed: dry and 100% pink Drainage (amount, consistency, odor) no drainage Periwound: Intact Dressing procedure/placement/frequency: Interdry to breast and groin areas.  Keep dermatologist appointment for other areas.  Will not follow at this time.  Please re-consult if needed.  Maple Hudson RN BSN CWON Pager [phone]

## 2013-08-04 NOTE — Progress Notes (Signed)
Interdry placed to skin folds of breast and groin. Pt educated to leave in place for 5 days, removing for baths and putting back after baths, also to not wash interdry cloth.  Baron Hamper, RN

## 2013-08-04 NOTE — Evaluation (Signed)
Physical Therapy Evaluation Patient Details Name: [name] MRN: [id number] DOB: 02-10-1951 Today's Date: 08/04/2013 Time: 0830-0906 PT Time Calculation (min): 36 min  PT Assessment / Plan / Recommendation History of Present Illness  Admitted with SOB and L LE pain (chronic).  Hx of anemia, morbid obesity, CHF, OSA, DM, HTN.  Was hospitalized approximately 1 month ago.  Clinical Impression  Pt with significant dyspnea t/o tx session. Pt with decreased activity tolerance and requires significant increase in time to complete task. Pt to benefit from HHPT to address mentioned deficits. Acute PT to follow to progress amb.    PT Assessment  Patient needs continued PT services    Follow Up Recommendations  Home health PT;Supervision/Assistance - 24 hour    Does the patient have the potential to tolerate intense rehabilitation      Barriers to Discharge        Equipment Recommendations  None recommended by PT    Recommendations for Other Services     Frequency Min 3X/week    Precautions / Restrictions Precautions Precautions: Fall Restrictions Weight Bearing Restrictions: No   Pertinent Vitals/Pain 8/10 L knee pain      Mobility  Bed Mobility Bed Mobility: Left Sidelying to Sit Left Sidelying to Sit: 3: Mod assist;HOB elevated Details for Bed Mobility Assistance: pt pulled up on PT with definite use of bed rail Transfers Transfers: Sit to Stand;Stand to Sit Sit to Stand: 4: Min assist;With upper extremity assist;From bed;From chair/3-in-1 Stand to Sit: 4: Min assist;With upper extremity assist;To chair/3-in-1 Stand Pivot Transfers: 4: Min assist (with RW, x2, 1x to BSC, 1x to chair) Details for Transfer Assistance: uses momentum, verbal cues for position of body to sitting surface Ambulation/Gait Ambulation/Gait Assistance: 4: Min assist Ambulation Distance (Feet):  (5 steps to bsc and then to chair) Assistive device: Rolling walker Ambulation/Gait Assistance  Details: antalgic, heavy reliance on bilat UEs and R LE Gait Pattern: Step-to pattern;Decreased stance time - left;Antalgic Gait velocity: slow    Exercises     PT Diagnosis: Difficulty walking;Generalized weakness  PT Problem List: Decreased strength;Decreased activity tolerance;Cardiopulmonary status limiting activity PT Treatment Interventions: DME instruction;Gait training;Functional mobility training;Therapeutic activities;Therapeutic exercise     PT Goals(Current goals can be found in the care plan section) Acute Rehab PT Goals Patient Stated Goal: return home PT Goal Formulation: With patient Time For Goal Achievement: 08/11/13 Potential to Achieve Goals: Fair  Visit Information  Last PT Received On: 08/04/13 Assistance Needed: +1 History of Present Illness: Admitted with SOB and L LE pain (chronic).  Hx of anemia, morbid obesity, CHF, OSA, DM, HTN.  Was hospitalized approximately 1 month ago.       Prior Functioning  Home Living Family/patient expects to be discharged to:: Private residence Living Arrangements: Spouse/significant other;Children Available Help at Discharge: Family;Available 24 hours/day Type of Home: House Home Access: Ramped entrance Home Layout: One level Home Equipment: Walker - 2 wheels Additional Comments: pt on 2LO2 at home during the night and during the day as needed Prior Function Level of Independence: Independent with assistive device(s);Needs assistance Gait / Transfers Assistance Needed: uses RW ADL's / Homemaking Assistance Needed: avoids socks, wears slip on shoes Communication Communication: No difficulties Dominant Hand: Left    Cognition  Cognition Arousal/Alertness: Awake/alert Behavior During Therapy: WFL for tasks assessed/performed Overall Cognitive Status: Within Functional Limits for tasks assessed    Extremity/Trunk Assessment Upper Extremity Assessment Upper Extremity Assessment: Overall WFL for tasks assessed Lower  Extremity Assessment Lower Extremity Assessment:  Defer to PT evaluation RLE Deficits / Details: noted edema ,grossly 3+/5 LLE Deficits / Details: greatly limited by pain and edema. c/p L knee pain, hypersensitive to touch in that leg Cervical / Trunk Assessment Cervical / Trunk Assessment: Normal   Balance Balance Balance Assessed: Yes Static Sitting Balance Static Sitting - Balance Support: Feet supported Static Sitting - Level of Assistance: 5: Stand by assistance Static Standing Balance Static Standing - Balance Support: Bilateral upper extremity supported;During functional activity Static Standing - Level of Assistance: 5: Stand by assistance  End of Session PT - End of Session Equipment Utilized During Treatment: Gait belt Activity Tolerance: Patient limited by pain Patient left: in chair;with call bell/phone within reach Nurse Communication: Mobility status  GP     Marcene Brawn 08/04/2013, 9:38 AM  Lewis Shock, PT, DPT Pager #: [phone] Office #: [phone]

## 2013-08-04 NOTE — Progress Notes (Signed)
Report given to receiving RN. Patient stable. No signs or symptoms of distress or discomfort. 

## 2013-08-04 NOTE — Progress Notes (Signed)
Utilization Review Completed.   Owais Pruett, RN, BSN Nurse Case Manager  [phone]  

## 2013-08-04 NOTE — Progress Notes (Signed)
Patient HR decreased to 37. Patient was sleeping at the time this occurred Patient immediately had a change in rhythm afterwards. MD made aware. EKG will be obtained. MD will follow up. Will continue to monitor patient for further changes in condition.

## 2013-08-04 NOTE — ED Provider Notes (Signed)
Medical screening examination/treatment/procedure(s) were conducted as a shared visit with non-physician practitioner(s) and myself.  I personally evaluated the patient during the encounter  62yo F, c/o gradual onset and worsening of persistent SOB, cough and "wheezing" for the past 4 days. States she has been using her qhs and prn home O2 daily due to her symptoms. Denies fevers, CP. Pt also c/o acute flair of her chronic left knee pain for the past several days. Endorses hx of chronic infection in TKR, on levaquin. Has been ambulating with her walker and denies falling. A&O, lungs coarse, tachypneic, RRR, abd soft/NT, left knee without deformity, erythema, warmth. Pt's O2 Sats despite wearing her home O2 N/C 93% at rest. H/H low but c/w previous. BNP elevated compared to previous. CXR with vasc congestion, possible infiltrate. Tx IV lasix, abx, admit.   Laray Anger, DO 08/04/13 2029

## 2013-08-04 NOTE — Progress Notes (Signed)
[facility] PROGRESS NOTE  [name] ZOX:[id number] DOB: [birth year] DOA: 08/03/2013 PCP: [name] [name], MD  Assessment/Plan  Left knee pain/prosthetic left knee infection on suppressive antibiotics:  -Patient presenting with significant left knee pain. He was apparently she had a left knee x-ray which was unremarkable. Radiology reporting left knee replacement, no complicating features. There were no joint effusions or fractures noted at on this study. She does have a history of left knee infection, as if she has been on Levaquin suppression therapy. We'll touch base with infectious disease today for further recommendations. She was started on IV vancomycin and aztreonam 2 g IV q. 8 hours. followup on cultures.  Shortness of breath:  -Possibilities include healthcare associated pneumonia. Pulmonary embolism is less likely as she presented with a supratherapeutic INR. Chest x-ray on presentation showed cardiomegaly with associated vascular congestion, making pulmonary edema probable contribute to symptoms as well. There was a mid right infrahilar airspace opacity which could be consistent with atelectasis versus infiltrate. She was started empirically on IV vancomycin & aztreonam per pharmacy.   Iron Deficiency Anemia:   -Likely secondary to chronic disease/chronic kidney disease. Repeat CBC this morning showed a downward trend in her hemoglobin from 7.5 to 7.1. It's possible that acute on chronic anemia may be contributing to her dyspneic symptoms. Plan to type and cross and transfuse her 2 units of packed red blood cells today.  Paroxysmal A. fib:  -She presents with a supratherapeutic INR 3.14. Pharmacy consultation for Coumadin dosing. Coumadin has been held will follow up in a.m. PT/INR. Meanwhile she remains in sinus.   Type II DM/IDDM: -We'll continue Accu-Cheks q. a.c. and each bedtime with sliding scale coverage  Acute on chronic combined CHF ProBNP increasing to 1789 with a  chest x-ray showing findings consistent with congestion. Continue IV Lasix twice a day. Monitor strict ins and outs.   Code Status: Full code Family Communication: Discussed with patient plan Disposition Plan:  Plan to continue empiric antibiotic therapy, touch base with infectious disease regarding severe left knee pain. Given increasing shortness of breath with a downward trend in hemoglobin, we'll transfuse packed red blood cells. Continue IV diuresis for probable acute decompensated heart failure.   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 She presented to the department overnight with complaints of severe left knee pain with associated dyspnea. She'll has a history of chronic anemia, per medical records have a baseline creatinine of 7.5. Patient on chronic anticoagulation for paroxysmal atrial fibrillation. This morning patient reporting having severe pain to her left knee, having difficulties getting out of bed to bedside commode. She is currently afebrile and hemodynamically stable.  Objective: Filed Vitals:   08/04/13 0829  BP: 149/76  Pulse: 70  Temp:   Resp:     Intake/Output Summary (Last 24 hours) at 08/04/13 0946 Last data filed at 08/04/13 0900  Gross per 24 hour  Intake    723 ml  Output   2350 ml  Net  -1627 ml   Filed Weights   08/03/13 1648 08/04/13 0359  Weight: 121.6 kg (268 lb 1.3 oz) 124.5 kg (274 lb 7.6 oz)    Exam:  General:  Patient is in no acute distress she is awake alert oriented. We opted out of bed to bedside commode this morning.  Cardiovascular: Regular  rate rhythm normal S1-S2 no murmurs rubs or gallops  Respiratory: Patient having by basilar crackles, a few expiratory wheezes.  Abdomen: Obese soft nontender nondistended positive bowel sounds  Musculoskeletal: Patient reporting severe pain to her left knee, limiting her ability to flex and extend her lower leg  Skin: Patient having psoriatic plaques involving the extensor surfaces  Data Reviewed: Basic Metabolic Panel:  Recent Labs Lab 08/03/13 1122 08/04/13 0713  NA 142 141  K 4.8 4.8  CL 108 107  CO2 25 26  GLUCOSE 94 102*  BUN 33* 31*  CREATININE 1.92* 2.01*  CALCIUM 9.0 8.5   Liver Function Tests: No results found for this basename: AST, ALT, ALKPHOS, BILITOT, PROT, ALBUMIN,  in the last 168 hours No results found for this basename: LIPASE, AMYLASE,  in the last 168 hours No results found for this basename: AMMONIA,  in the last 168 hours CBC:  Recent Labs Lab 08/03/13 1122 08/04/13 0713  WBC 8.6 8.1  NEUTROABS 7.5  --   HGB 7.5* 7.1*  HCT 25.0* 24.2*  MCV 71.8* 72.9*  PLT 346 312   Cardiac Enzymes:  Recent Labs Lab 08/03/13 1122  TROPONINI <0.30   BNP (last 3 results)  Recent Labs  02/15/13 0434 07/05/13 1536 08/03/13 1122  PROBNP 438.6* 1581.0* 1789.0*   CBG:  Recent Labs Lab 08/03/13 1626 08/03/13 2130 08/04/13 0613  GLUCAP 146* 170* 99    Recent Results (from the past 240 hour(s))  MRSA PCR SCREENING     Status: Abnormal   Collection Time    08/03/13  4:56 PM      Result Value Range Status   MRSA by PCR POSITIVE (*) NEGATIVE Final   Comment:            The GeneXpert MRSA Assay (FDA     approved for NASAL specimens     only), is one component of a     comprehensive MRSA colonization     surveillance program. It is not     intended to diagnose MRSA     infection nor to guide or     monitor treatment for     MRSA infections.     RESULT CALLED TO, READ BACK BY AND VERIFIED WITH:     Dorthey Sawyer RN 18:45 08/03/13 ([email or web address])      Studies: Dg Chest 1 View  08/03/2013   CLINICAL DATA:  shortness of breath, weakness. Left knee swelling.  EXAM: CHEST - 1 VIEW  COMPARISON:  07/05/2013  FINDINGS: Mild cardiomegaly with vascular congestion. Mild right infrahilar airspace opacity, atelectasis versus infiltrate. No confluent opacity on the left. No visible effusions. No overt edema or acute bony abnormality.  IMPRESSION: Cardiomegaly, vascular congestion.  Right infrahilar atelectasis or infiltrate.   Electronically Signed   By: Charlett Nose   On: 08/03/2013 13:14   Dg Knee Complete 4 Views Left  08/03/2013   CLINICAL DATA:  Shortness of breath, weakness.  EXAM: LEFT KNEE - COMPLETE 4+ VIEW  COMPARISON:  06/09/2012  FINDINGS: Changes of left knee replacement. No joint effusion. No acute fracture, subluxation or dislocation.  IMPRESSION: Left knee replacement. No complicating feature.   Electronically Signed   By: Charlett Nose   On: 08/03/2013 13:16    Scheduled Meds: . allopurinol  100 mg Oral Daily  . amiodarone  200 mg Oral Daily  . amLODipine  10 mg Oral Daily  . atorvastatin  20 mg Oral QHS  . aztreonam  2 g Intravenous Q8H  . ferrous sulfate  325 mg Oral TID WC  . fluconazole (DIFLUCAN) IV  200 mg Intravenous Q24H  . furosemide  60 mg Intravenous BID  . hydrALAZINE  50 mg Oral TID  . insulin aspart  0-5 Units Subcutaneous QHS  . insulin aspart  0-9 Units Subcutaneous TID WC  . insulin glargine  20 Units Subcutaneous BID  . pantoprazole  40 mg Oral Daily  . sodium chloride  3 mL Intravenous Q12H  . spironolactone  25 mg Oral BID  . vancomycin  1,500 mg Intravenous Q24H  . Warfarin - Pharmacist Dosing Inpatient   Does not apply q1800   Continuous Infusions:   Active Problems:   HYPERLIPIDEMIA   OBSTRUCTIVE SLEEP APNEA   HYPERTENSION   Obesity hypoventilation syndrome   Prosthetic joint infection   Gout   DM (diabetes mellitus)   Chronic anticoagulation   Chronic venous insufficiency   CKD (chronic kidney  disease), stage IV   Anemia of chronic disease   PAF (paroxysmal atrial fibrillation)   Psoriasis   Morbid obesity   HCAP (healthcare-associated pneumonia)   Systolic and diastolic CHF, acute on chronic    Time spent: 35    Jeralyn Bennett  [facility] Pager [phone]. If 7PM-7AM, please contact night-coverage at [email or web address], password [facility] 08/04/2013, 9:46 AM  LOS: 1 day

## 2013-08-04 NOTE — Consult Note (Signed)
[facility]    Date of Admission:  08/03/2013  Date of Consult:  08/04/2013  Reason for Consult: knee pain and concern for septic knee and CAP Referring Physician: Dr. Vanessa Barbara   HPI: [name] is an 62 y.o. female. WITH HISTORY OF PROSTHETIC KNEE INFECTION IN 2001, [place] TEQUIN AND RIFAMPIN WITH TWO STAGED JOINT REPLACEMENT, AND HISTORY OF MRSA FROM KNEE WOUND IN 2007, group B streptococcal infection in June 2012. R to clindamycin. (she also did have gout crystals in the joint. She was on chronic levofloxacin for suppressive therapy. According to a phone note by my partner Dr. Drue Second the patient had been apparently changed from levofloxacin to doxycycline in August 2013 with apparent flareup of her swelling in her knee. Since then she had gone back onto levaquin with improvement in her knee pain.  She had been followed by Dr Renae Fickle and Dr Luiz Blare but apparently has large bill with their practice and claims this as reason she no longer follows there. She has been seen at [facility] where  she was told her only option was [facility] and she is not seen there.  wE have continued to follow her on renally dosed levaquin which she is taking faithfully. In July prior to her visitiing me she had pain and swelling behind knee and in leg and went to ED where doppler was negative. Prior to her visit with me her pain had improved   She was just admitted to [facility] last night with worsening dyspnea and knee pain. CXR showed cardiomegaly, vascular congestion and ? Infrahilar infiltrates. She was started on vancomyicn and aztreonam for possible PNA.   I suspect that majority of her pulmonary complaints are related to her atrial fibrillation, heart failiure.     Past Medical History  Diagnosis Date  . Asthma   . Bronchitis   . Allergic rhinitis   . Diabetes mellitus   . HTN (hypertension)   . DJD (degenerative joint disease)   . Hyperlipidemia   . Complication of anesthesia    trouble waking up  . Obesity   . OSA (obstructive sleep apnea)     poor compliance with cpap  . CHF (congestive heart failure)   . Biventricular failure     compensated  . Psoriasis   . Stasis edema     of lower extremities  . Depression   . Situational stress   . Kidney stones   . Kidney disease, chronic, stage III (GFR 30-59 ml/min)   . Anemia   . Gout   . Chronic anticoagulation   . Yeast infection involving the vagina and surrounding area   . Atrial fibrillation with RVR   . Stroke     denies deficits  . GERD (gastroesophageal reflux disease)   . Pneumonia 08/03/2013    Past Surgical History  Procedure Laterality Date  . Appendectomy    . Cholecystectomy    . Total knee arthroplasty    . Total abdominal hysterectomy    . Back surgery    . Cystectomy      left hand  ergies:   Allergies  Allergen Reactions  . Daptomycin Other (See Comments)    Elevated CPK  . Morphine And Related Nausea And Vomiting  . Cephalexin Rash    unknown  . Celecoxib     unknown  . Codeine     unknown  . Fluoxetine Hcl     unknown  . Latex  REACTION: undefined  . Ofloxacin     unknown  . Penicillins     unknown  . Rofecoxib     unknown  . Sulfonamide Derivatives     unknown     Medications: I have reviewed patients current medications as documented in Epic Anti-infectives   Start     Dose/Rate Route Frequency Ordered Stop   08/04/13 2000  fluconazole (DIFLUCAN) IVPB 200 mg     200 mg 100 mL/hr over 60 Minutes Intravenous Every 24 hours 08/03/13 1832     08/04/13 1800  vancomycin (VANCOCIN) 1,500 mg in sodium chloride 0.9 % 500 mL IVPB  Status:  Discontinued     1,500 mg 250 mL/hr over 120 Minutes Intravenous Every 24 hours 08/03/13 1832 08/04/13 1024   08/03/13 2000  fluconazole (DIFLUCAN) IVPB 400 mg     400 mg 100 mL/hr over 120 Minutes Intravenous  Once 08/03/13 1832 08/04/13 0338   08/03/13 1830  vancomycin (VANCOCIN) 1,500 mg in sodium chloride 0.9 % 500 mL  IVPB     1,500 mg 250 mL/hr over 120 Minutes Intravenous  Once 08/03/13 1828 08/04/13 0004   08/03/13 1800  aztreonam (AZACTAM) 2 g in dextrose 5 % 50 mL IVPB  Status:  Discontinued     2 g 100 mL/hr over 30 Minutes Intravenous 3 times per day 08/03/13 1719 08/04/13 1024   08/03/13 1500  vancomycin (VANCOCIN) IVPB 1000 mg/200 mL premix     1,000 mg 200 mL/hr over 60 Minutes Intravenous  Once 08/03/13 1452 08/03/13 1834   08/03/13 1400  levofloxacin (LEVAQUIN) IVPB 500 mg  Status:  Discontinued     500 mg 100 mL/hr over 60 Minutes Intravenous  Once 08/03/13 1359 08/03/13 1717      Social History:  reports that she quit smoking about 31 years ago. Her smoking use included Cigarettes. She smoked 0.00 packs per day. She has never used smokeless tobacco. She reports that she does not drink alcohol or use illicit drugs.  Family History  Problem Relation Age of Onset  . Heart attack Father   . Asthma Father   . Heart disease Paternal Uncle   . Rectal cancer Paternal Aunt   . Other Mother     mva    As in HPI and primary teams notes otherwise 12 point review of systems is negative  Blood pressure 139/55, pulse 69, temperature 98.8 F (37.1 C), temperature source Oral, resp. rate 20, height 5\' 3"  (1.6 m), weight 274 lb 7.6 oz (124.5 kg), SpO2 100.00%. General: Alert and awake, oriented x3, not in any acute distress.morbidly obese HEENT: anicteric sclera, pupils reactive to light and accommodation, EOMI, oropharynx clear and without exudate CVS irr irregular rate, normal r,  no murmur rubs or gallops Chest: diminised breath sounds at the bases  clear to auscultation bilaterally, no wheezing, rales or rhonchi Abdomen: soft nontender, nondistended, normal bowel sounds, Extremities: Left TKA scar is warm and at inferior aspect is EXQUISITELY tender to palpation, she has thickened skin and bilateral venous stasis changes  Neuro: nonfocal, strength and sensation intact   Results for orders  placed during the hospital encounter of 08/03/13 (from the past 48 hour(s))  CBC WITH DIFFERENTIAL     Status: Abnormal   Collection Time    08/03/13 11:22 AM      Result Value Range   WBC 8.6  4.0 - 10.5 K/uL   RBC 3.48 (*) 3.87 - 5.11 MIL/uL   Hemoglobin 7.5 (*) 12.0 -  15.0 g/dL   HCT 16.1 (*) 09.6 - 04.5 %   MCV 71.8 (*) 78.0 - 100.0 fL   MCH 21.6 (*) 26.0 - 34.0 pg   MCHC 30.0  30.0 - 36.0 g/dL   RDW 40.9 (*) 81.1 - 91.4 %   Platelets 346  150 - 400 K/uL   Neutrophils Relative % 87 (*) 43 - 77 %   Lymphocytes Relative 7 (*) 12 - 46 %   Monocytes Relative 5  3 - 12 %   Eosinophils Relative 1  0 - 5 %   Basophils Relative 0  0 - 1 %   Neutro Abs 7.5  1.7 - 7.7 K/uL   Lymphs Abs 0.6 (*) 0.7 - 4.0 K/uL   Monocytes Absolute 0.4  0.1 - 1.0 K/uL   Eosinophils Absolute 0.1  0.0 - 0.7 K/uL   Basophils Absolute 0.0  0.0 - 0.1 K/uL  BASIC METABOLIC PANEL     Status: Abnormal   Collection Time    08/03/13 11:22 AM      Result Value Range   Sodium 142  135 - 145 mEq/L   Potassium 4.8  3.5 - 5.1 mEq/L   Chloride 108  96 - 112 mEq/L   CO2 25  19 - 32 mEq/L   Glucose, Bld 94  70 - 99 mg/dL   BUN 33 (*) 6 - 23 mg/dL   Creatinine, Ser 7.82 (*) 0.50 - 1.10 mg/dL   Calcium 9.0  8.4 - 95.6 mg/dL   GFR calc non Af Amer 27 (*) >90 mL/min   GFR calc Af Amer 31 (*) >90 mL/min   Comment: (NOTE)     The eGFR has been calculated using the CKD EPI equation.     This calculation has not been validated in all clinical situations.     eGFR's persistently <90 mL/min signify possible Chronic Kidney     Disease.  PRO B NATRIURETIC PEPTIDE     Status: Abnormal   Collection Time    08/03/13 11:22 AM      Result Value Range   Pro B Natriuretic peptide (BNP) 1789.0 (*) 0 - 125 pg/mL  TROPONIN I     Status: None   Collection Time    08/03/13 11:22 AM      Result Value Range   Troponin I <0.30  <0.30 ng/mL   Comment:            Due to the release kinetics of cTnI,     a negative result within the  first hours     of the onset of symptoms does not rule out     myocardial infarction with certainty.     If myocardial infarction is still suspected,     repeat the test at appropriate intervals.  URINALYSIS, ROUTINE W REFLEX MICROSCOPIC     Status: Abnormal   Collection Time    08/03/13 11:35 AM      Result Value Range   Color, Urine YELLOW  YELLOW   APPearance CLOUDY (*) CLEAR   Specific Gravity, Urine 1.014  1.005 - 1.030   pH 6.0  5.0 - 8.0   Glucose, UA NEGATIVE  NEGATIVE mg/dL   Hgb urine dipstick MODERATE (*) NEGATIVE   Bilirubin Urine NEGATIVE  NEGATIVE   Ketones, ur NEGATIVE  NEGATIVE mg/dL   Protein, ur 213 (*) NEGATIVE mg/dL   Urobilinogen, UA 0.2  0.0 - 1.0 mg/dL   Nitrite NEGATIVE  NEGATIVE   Leukocytes, UA  NEGATIVE  NEGATIVE  URINE MICROSCOPIC-ADD ON     Status: Abnormal   Collection Time    08/03/13 11:35 AM      Result Value Range   Squamous Epithelial / LPF MANY (*) RARE   WBC, UA 0-2  <3 WBC/hpf   RBC / HPF 3-6  <3 RBC/hpf   Bacteria, UA RARE  RARE   Urine-Other AMORPHOUS URATES/PHOSPHATES    PROTIME-INR     Status: Abnormal   Collection Time    08/03/13  2:04 PM      Result Value Range   Prothrombin Time 31.1 (*) 11.6 - 15.2 seconds   INR 3.14 (*) 0.00 - 1.49  APTT     Status: Abnormal   Collection Time    08/03/13  2:04 PM      Result Value Range   aPTT 73 (*) 24 - 37 seconds   Comment:            IF BASELINE aPTT IS ELEVATED,     SUGGEST PATIENT RISK ASSESSMENT     BE USED TO DETERMINE APPROPRIATE     ANTICOAGULANT THERAPY.  TYPE AND SCREEN     Status: None   Collection Time    08/03/13  2:47 PM      Result Value Range   ABO/RH(D) A POS     Antibody Screen NEG     Sample Expiration 08/06/2013     Unit Number [id number]     Blood Component Type RED CELLS,LR     Unit division 00     Status of Unit ISSUED     Transfusion Status OK TO TRANSFUSE     Crossmatch Result Compatible     Unit Number [id number]     Blood Component Type RED  CELLS,LR     Unit division 00     Status of Unit ALLOCATED     Transfusion Status OK TO TRANSFUSE     Crossmatch Result Compatible    GLUCOSE, CAPILLARY     Status: Abnormal   Collection Time    08/03/13  4:26 PM      Result Value Range   Glucose-Capillary 146 (*) 70 - 99 mg/dL  MRSA PCR SCREENING     Status: Abnormal   Collection Time    08/03/13  4:56 PM      Result Value Range   MRSA by PCR POSITIVE (*) NEGATIVE   Comment:            The GeneXpert MRSA Assay (FDA     approved for NASAL specimens     only), is one component of a     comprehensive MRSA colonization     surveillance program. It is not     intended to diagnose MRSA     infection nor to guide or     monitor treatment for     MRSA infections.     RESULT CALLED TO, READ BACK BY AND VERIFIED WITH:     Dorthey Sawyer RN 18:45 08/03/13 ([email or web address])  HIV ANTIBODY (ROUTINE TESTING)     Status: None   Collection Time    08/03/13  5:47 PM      Result Value Range   HIV NON REACTIVE  NON REACTIVE   Comment: Performed at [facility]  LEGIONELLA ANTIGEN, URINE     Status: None   Collection Time    08/03/13  6:46 PM      Result Value Range   Specimen Description URINE, CLEAN  CATCH     Special Requests NONE     Legionella Antigen, Urine       Value: Negative for Legionella pneumophilia serogroup 1     Performed at [facility]   Report Status 08/04/2013 FINAL    STREP PNEUMONIAE URINARY ANTIGEN     Status: None   Collection Time    08/03/13  6:46 PM      Result Value Range   Strep Pneumo Urinary Antigen NEGATIVE  NEGATIVE   Comment:            Infection due to S. pneumoniae     cannot be absolutely ruled out     since the antigen present     may be below the detection limit     of the test.  GLUCOSE, CAPILLARY     Status: Abnormal   Collection Time    08/03/13  9:30 PM      Result Value Range   Glucose-Capillary 170 (*) 70 - 99 mg/dL   Comment 1 Notify RN    GLUCOSE, CAPILLARY     Status: None    Collection Time    08/04/13  6:13 AM      Result Value Range   Glucose-Capillary 99  70 - 99 mg/dL  BASIC METABOLIC PANEL     Status: Abnormal   Collection Time    08/04/13  7:13 AM      Result Value Range   Sodium 141  135 - 145 mEq/L   Potassium 4.8  3.5 - 5.1 mEq/L   Chloride 107  96 - 112 mEq/L   CO2 26  19 - 32 mEq/L   Glucose, Bld 102 (*) 70 - 99 mg/dL   BUN 31 (*) 6 - 23 mg/dL   Creatinine, Ser 4.69 (*) 0.50 - 1.10 mg/dL   Calcium 8.5  8.4 - 62.9 mg/dL   GFR calc non Af Amer 25 (*) >90 mL/min   GFR calc Af Amer 29 (*) >90 mL/min   Comment: (NOTE)     The eGFR has been calculated using the CKD EPI equation.     This calculation has not been validated in all clinical situations.     eGFR's persistently <90 mL/min signify possible Chronic Kidney     Disease.  CBC     Status: Abnormal   Collection Time    08/04/13  7:13 AM      Result Value Range   WBC 8.1  4.0 - 10.5 K/uL   RBC 3.32 (*) 3.87 - 5.11 MIL/uL   Hemoglobin 7.1 (*) 12.0 - 15.0 g/dL   HCT 52.8 (*) 41.3 - 24.4 %   MCV 72.9 (*) 78.0 - 100.0 fL   MCH 21.4 (*) 26.0 - 34.0 pg   MCHC 29.3 (*) 30.0 - 36.0 g/dL   RDW 01.0 (*) 27.2 - 53.6 %   Platelets 312  150 - 400 K/uL  PROTIME-INR     Status: Abnormal   Collection Time    08/04/13  7:13 AM      Result Value Range   Prothrombin Time 33.7 (*) 11.6 - 15.2 seconds   INR 3.48 (*) 0.00 - 1.49  PREPARE RBC (CROSSMATCH)     Status: None   Collection Time    08/04/13 10:12 AM      Result Value Range   Order Confirmation ORDER PROCESSED BY BLOOD BANK    GLUCOSE, CAPILLARY     Status: Abnormal   Collection Time  08/04/13 11:19 AM      Result Value Range   Glucose-Capillary 100 (*) 70 - 99 mg/dL      Component Value Date/Time   SDES URINE, CLEAN CATCH 08/03/2013 1846   SPECREQUEST NONE 08/03/2013 1846   CULT NO GROWTH 5 DAYS 02/14/2013 1300   REPTSTATUS 08/04/2013 FINAL 08/03/2013 1846   Dg Chest 1 View  08/03/2013   CLINICAL DATA:  shortness of breath,  weakness. Left knee swelling.  EXAM: CHEST - 1 VIEW  COMPARISON:  07/05/2013  FINDINGS: Mild cardiomegaly with vascular congestion. Mild right infrahilar airspace opacity, atelectasis versus infiltrate. No confluent opacity on the left. No visible effusions. No overt edema or acute bony abnormality.  IMPRESSION: Cardiomegaly, vascular congestion.  Right infrahilar atelectasis or infiltrate.   Electronically Signed   By: Charlett Nose   On: 08/03/2013 13:14   Dg Knee Complete 4 Views Left  08/03/2013   CLINICAL DATA:  Shortness of breath, weakness.  EXAM: LEFT KNEE - COMPLETE 4+ VIEW  COMPARISON:  06/09/2012  FINDINGS: Changes of left knee replacement. No joint effusion. No acute fracture, subluxation or dislocation.  IMPRESSION: Left knee replacement. No complicating feature.   Electronically Signed   By: Charlett Nose   On: 08/03/2013 13:16     Recent Results (from the past 720 hour(s))  MRSA PCR SCREENING     Status: Abnormal   Collection Time    07/05/13 11:25 PM      Result Value Range Status   MRSA by PCR POSITIVE (*) NEGATIVE Final   Comment:            The GeneXpert MRSA Assay (FDA     approved for NASAL specimens     only), is one component of a     comprehensive MRSA colonization     surveillance program. It is not     intended to diagnose MRSA     infection nor to guide or     monitor treatment for     MRSA infections.     RESULT CALLED TO, READ BACK BY AND VERIFIED WITH:     CALLED TO RN Carleene Cooper [id number] @[id number]  THANEY  MRSA PCR SCREENING     Status: Abnormal   Collection Time    08/03/13  4:56 PM      Result Value Range Status   MRSA by PCR POSITIVE (*) NEGATIVE Final   Comment:            The GeneXpert MRSA Assay (FDA     approved for NASAL specimens     only), is one component of a     comprehensive MRSA colonization     surveillance program. It is not     intended to diagnose MRSA     infection nor to guide or     monitor treatment for     MRSA infections.      RESULT CALLED TO, READ BACK BY AND VERIFIED WITH:     DBarry Dienes RN 18:45 08/03/13 ([email or web address])     Impression/Recommendation  #1 Possible recurrence of prosthetic TKA:  --would with-hold antibiotics and ask Orthopedics to see her AFTER she has been off abx for several days to increase yield on culture on aspiration form the joint --would ask Orthopedics vs IR to aspirate the joint for cell count diff, crystals, culture after 48 hours OFF antibiotics  #2 Infiltrates: I dont think this is PNA. I would treat her for heart failure, volume overload and dc antibiotics  Thank you so much for this interesting consult  [facility] [phone] (pager) [phone] (office) 08/04/2013, 4:14 PM  Paulette Blanch Dam 08/04/2013, 4:14 PM

## 2013-08-04 NOTE — Evaluation (Signed)
Occupational Therapy Evaluation Patient Details Name: [name] MRN: [id number] DOB: 07-08-1951 Today's Date: 08/04/2013 Time: 4098-1191 OT Time Calculation (min): 35 min  OT Assessment / Plan / Recommendation History of present illness Admitted with SOB and L LE pain (chronic).  Hx of anemia, morbid obesity, CHF, OSA, DM, HTN.  Was hospitalized approximately 1 month ago.   Clinical Impression   Pt presents with low activity tolerance, impaired balance, pain in L knee and dependence in self care.  Will follow acutely to instruct in energy conservation and use of AE for LB ADL as well as activities to increase activity tolerance.    OT Assessment  Patient needs continued OT Services    Follow Up Recommendations  No OT follow up    Barriers to Discharge      Equipment Recommendations  None recommended by OT    Recommendations for Other Services    Frequency  Min 2X/week    Precautions / Restrictions Precautions Precautions: Fall Restrictions Weight Bearing Restrictions: No   Pertinent Vitals/Pain L LE did not rate, appears severe, repositioned    ADL  Eating/Feeding: Independent Where Assessed - Eating/Feeding: Chair Grooming: Wash/dry hands;Wash/dry face;Set up Where Assessed - Grooming: Unsupported sitting Upper Body Bathing: Minimal assistance Where Assessed - Upper Body Bathing: Unsupported sitting Lower Body Bathing: Maximal assistance Where Assessed - Lower Body Bathing: Unsupported sitting;Supported sit to stand Upper Body Dressing: Set up Where Assessed - Upper Body Dressing: Unsupported sitting Lower Body Dressing: +1 Total assistance Where Assessed - Lower Body Dressing: Unsupported sitting;Supported sit to stand Toilet Transfer: Minimal assistance Toilet Transfer Method: Stand pivot Toilet Transfer Equipment: Bedside commode Toileting - Clothing Manipulation and Hygiene: +1 Total assistance Where Assessed - Glass blower/designer Manipulation and  Hygiene: Standing Equipment Used: Rolling walker (3L 02) Transfers/Ambulation Related to ADLs: min assist transfers with RW ADL Comments: Pt with limited access to feet, could benefit from LB AE.    OT Diagnosis: Generalized weakness;Acute pain  OT Problem List: Decreased activity tolerance;Impaired balance (sitting and/or standing);Cardiopulmonary status limiting activity;Obesity;Pain;Decreased strength OT Treatment Interventions: Self-care/ADL training;DME and/or AE instruction;Energy conservation;Patient/family education   OT Goals(Current goals can be found in the care plan section) Acute Rehab OT Goals Patient Stated Goal: return home OT Goal Formulation: With patient Time For Goal Achievement: 08/18/13 Potential to Achieve Goals: Good  Visit Information  Last OT Received On: 08/04/13 Assistance Needed: +1 PT/OT Co-Evaluation/Treatment: Yes History of Present Illness: Admitted with SOB and L LE pain (chronic).  Hx of anemia, morbid obesity, CHF, OSA, DM, HTN.  Was hospitalized approximately 1 month ago.       Prior Functioning     Home Living Family/patient expects to be discharged to:: Private residence Living Arrangements: Spouse/significant other;Children Available Help at Discharge: Family;Available 24 hours/day Type of Home: House Home Access: Ramped entrance Home Layout: One level Home Equipment: Walker - 2 wheels Additional Comments: pt on 2LO2 at home during the night and during the day as needed Prior Function Level of Independence: Independent with assistive device(s);Needs assistance Gait / Transfers Assistance Needed: uses RW ADL's / Homemaking Assistance Needed: avoids socks, wears slip on shoes Communication Communication: No difficulties Dominant Hand: Left         Vision/Perception Vision - History Baseline Vision: Wears glasses all the time Patient Visual Report: No change from baseline   Cognition  Cognition Arousal/Alertness:  Awake/alert Behavior During Therapy: WFL for tasks assessed/performed Overall Cognitive Status: Within Functional Limits for tasks assessed    Extremity/Trunk  Assessment Upper Extremity Assessment Upper Extremity Assessment: Overall WFL for tasks assessed Lower Extremity Assessment Lower Extremity Assessment: Defer to PT evaluation Cervical / Trunk Assessment Cervical / Trunk Assessment: Normal     Mobility Bed Mobility Bed Mobility: Left Sidelying to Sit Left Sidelying to Sit: 3: Mod assist;HOB elevated Transfers Transfers: Sit to Stand;Stand to Sit Sit to Stand: 4: Min assist;With upper extremity assist;From bed;From chair/3-in-1 Stand to Sit: 4: Min assist;With upper extremity assist;To chair/3-in-1 Details for Transfer Assistance: uses momentum, verbal cues for position of body to sitting surface     Exercise     Balance Balance Balance Assessed: Yes Static Sitting Balance Static Sitting - Balance Support: Feet supported Static Sitting - Level of Assistance: 5: Stand by assistance Static Standing Balance Static Standing - Balance Support: Bilateral upper extremity supported;During functional activity Static Standing - Level of Assistance: 5: Stand by assistance   End of Session OT - End of Session Activity Tolerance: Patient limited by fatigue;Patient limited by pain Patient left: in chair;with call bell/phone within reach Nurse Communication: Mobility status  GO     Evern Bio 08/04/2013, 9:22 AM

## 2013-08-05 DIAGNOSIS — N189 Chronic kidney disease, unspecified: Secondary | ICD-10-CM

## 2013-08-05 DIAGNOSIS — D638 Anemia in other chronic diseases classified elsewhere: Secondary | ICD-10-CM

## 2013-08-05 DIAGNOSIS — I5031 Acute diastolic (congestive) heart failure: Secondary | ICD-10-CM

## 2013-08-05 DIAGNOSIS — Y831 Surgical operation with implant of artificial internal device as the cause of abnormal reaction of the patient, or of later complication, without mention of misadventure at the time of the procedure: Secondary | ICD-10-CM

## 2013-08-05 DIAGNOSIS — I10 Essential (primary) hypertension: Secondary | ICD-10-CM

## 2013-08-05 DIAGNOSIS — B373 Candidiasis of vulva and vagina: Secondary | ICD-10-CM

## 2013-08-05 DIAGNOSIS — N179 Acute kidney failure, unspecified: Secondary | ICD-10-CM

## 2013-08-05 LAB — PROTIME-INR: INR: 3.28 — ABNORMAL HIGH (ref 0.00–1.49)

## 2013-08-05 LAB — TYPE AND SCREEN
ABO/RH(D): A POS
Unit division: 0

## 2013-08-05 LAB — GLUCOSE, CAPILLARY

## 2013-08-05 LAB — BASIC METABOLIC PANEL
BUN: 34 mg/dL — ABNORMAL HIGH (ref 6–23)
Creatinine, Ser: 2.36 mg/dL — ABNORMAL HIGH (ref 0.50–1.10)
GFR calc Af Amer: 24 mL/min — ABNORMAL LOW (ref >90)
GFR calc non Af Amer: 21 mL/min — ABNORMAL LOW (ref >90)
Potassium: 5 mEq/L (ref 3.5–5.1)

## 2013-08-05 LAB — CBC
HCT: 28.7 % — ABNORMAL LOW (ref 36.0–46.0)
MCHC: 30.3 g/dL (ref 30.0–36.0)
MCV: 75.1 fL — ABNORMAL LOW (ref 78.0–100.0)
Platelets: 317 10*3/uL (ref 150–400)
RDW: 21.4 % — ABNORMAL HIGH (ref 11.5–15.5)

## 2013-08-05 MED ORDER — FUROSEMIDE 40 MG PO TABS
40.0000 mg | ORAL_TABLET | Freq: Two times a day (BID) | ORAL | Status: DC
  Administered 2013-08-05 – 2013-08-06 (×2): 40 mg via ORAL
  Filled 2013-08-05 (×4): qty 1

## 2013-08-05 NOTE — Progress Notes (Signed)
[facility]    Subjective: No new complaints   Antibiotics:  Anti-infectives   Start     Dose/Rate Route Frequency Ordered Stop   08/04/13 2000  fluconazole (DIFLUCAN) IVPB 200 mg  Status:  Discontinued     200 mg 100 mL/hr over 60 Minutes Intravenous Every 24 hours 08/03/13 1832 08/05/13 1528   08/04/13 1800  vancomycin (VANCOCIN) 1,500 mg in sodium chloride 0.9 % 500 mL IVPB  Status:  Discontinued     1,500 mg 250 mL/hr over 120 Minutes Intravenous Every 24 hours 08/03/13 1832 08/04/13 1024   08/03/13 2000  fluconazole (DIFLUCAN) IVPB 400 mg     400 mg 100 mL/hr over 120 Minutes Intravenous  Once 08/03/13 1832 08/04/13 0338   08/03/13 1830  vancomycin (VANCOCIN) 1,500 mg in sodium chloride 0.9 % 500 mL IVPB     1,500 mg 250 mL/hr over 120 Minutes Intravenous  Once 08/03/13 1828 08/04/13 0004   08/03/13 1800  aztreonam (AZACTAM) 2 g in dextrose 5 % 50 mL IVPB  Status:  Discontinued     2 g 100 mL/hr over 30 Minutes Intravenous 3 times per day 08/03/13 1719 08/04/13 1024   08/03/13 1500  vancomycin (VANCOCIN) IVPB 1000 mg/200 mL premix     1,000 mg 200 mL/hr over 60 Minutes Intravenous  Once 08/03/13 1452 08/03/13 1834   08/03/13 1400  levofloxacin (LEVAQUIN) IVPB 500 mg  Status:  Discontinued     500 mg 100 mL/hr over 60 Minutes Intravenous  Once 08/03/13 1359 08/03/13 1717      Medications: Scheduled Meds: . allopurinol  100 mg Oral Daily  . amiodarone  200 mg Oral Daily  . amLODipine  10 mg Oral Daily  . atorvastatin  20 mg Oral QHS  . Chlorhexidine Gluconate Cloth  6 each Topical Q0600  . ferrous sulfate  325 mg Oral TID WC  . furosemide  40 mg Oral BID  . hydrALAZINE  50 mg Oral TID  . insulin aspart  0-5 Units Subcutaneous QHS  . insulin aspart  0-9 Units Subcutaneous TID WC  . insulin glargine  20 Units Subcutaneous BID  . mupirocin ointment   Nasal BID  . pantoprazole  40 mg Oral Daily  . sodium chloride  3 mL Intravenous Q12H    . spironolactone  25 mg Oral BID  . Warfarin - Pharmacist Dosing Inpatient   Does not apply q1800   Continuous Infusions:  PRN Meds:.acetaminophen, acetaminophen, albuterol, diphenhydrAMINE, ondansetron (ZOFRAN) IV, ondansetron, oxyCODONE-acetaminophen, zolpidem   Objective: Weight change: 10 lb 12.8 oz (4.9 kg)  Intake/Output Summary (Last 24 hours) at 08/05/13 1656 Last data filed at 08/05/13 1625  Gross per 24 hour  Intake   1080 ml  Output   3375 ml  Net  -2295 ml   Blood pressure 144/64, pulse 67, temperature 98.3 F (36.8 C), temperature source Oral, resp. rate 18, height 5\' 3"  (1.6 m), weight 278 lb 14.1 oz (126.5 kg), SpO2 98.00%. Temp:  [98.2 F (36.8 C)-98.7 F (37.1 C)] 98.3 F (36.8 C) (08/28 1300) Pulse Rate:  [67-99] 67 (08/28 1622) Resp:  [18-22] 18 (08/28 0518) BP: (138-164)/(50-81) 144/64 mmHg (08/28 1622) SpO2:  [96 %-98 %] 98 % (08/28 1300) Weight:  [278 lb 14.1 oz (126.5 kg)] 278 lb 14.1 oz (126.5 kg) (08/28 0518)  Physical Exam: General: Alert and awake, oriented x3, not in any acute distress.morbidly obese  HEENT: anicteric sclera, pupils reactive to light and accommodation,  EOMI, oropharynx clear and without exudate  CVS irr irregular rate, normal r, no murmur rubs or gallops  Chest: diminised breath sounds at the bases clear to auscultation bilaterally, no wheezing, rales or rhonchi  Abdomen: soft nontender, nondistended, normal bowel sounds,  Extremities:  Left TKA scar is warm and at inferior aspect is EXQUISITELY tender to palpation, she has thickened skin and bilateral venous stasis changes  Neuro: nonfocal, strength and sensation intact  Lab Results:  Recent Labs  08/04/13 2230 08/05/13 0415  WBC 9.4 8.5  HGB 8.7* 8.7*  HCT 28.3* 28.7*  PLT 312 317    BMET  Recent Labs  08/04/13 0713 08/05/13 0415  NA 141 140  K 4.8 5.0  CL 107 105  CO2 26 26  GLUCOSE 102* 64*  BUN 31* 34*  CREATININE 2.01* 2.36*  CALCIUM 8.5 8.7     Micro Results: Recent Results (from the past 240 hour(s))  MRSA PCR SCREENING     Status: Abnormal   Collection Time    08/03/13  4:56 PM      Result Value Range Status   MRSA by PCR POSITIVE (*) NEGATIVE Final   Comment:            The GeneXpert MRSA Assay (FDA     approved for NASAL specimens     only), is one component of a     comprehensive MRSA colonization     surveillance program. It is not     intended to diagnose MRSA     infection nor to guide or     monitor treatment for     MRSA infections.     RESULT CALLED TO, READ BACK BY AND VERIFIED WITH:     Dorthey Sawyer RN 18:45 08/03/13 ([email or web address])    Studies/Results: No results found.    Assessment/Plan: [name] is a 62 y.o. female with possible flare of prosthetic TKA ? 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 I would continue to treat her for heart failure, volume overload   Dr. Ninetta Lights will take over the service tomorrow.      LOS: 2 days   Acey Lav 08/05/2013, 4:56 PM

## 2013-08-05 NOTE — Progress Notes (Signed)
[facility] PROGRESS NOTE  [name] ZOX:[id number] DOB: 26-Sep-1951 DOA: 08/03/2013 PCP: Nancy Saucer [name], MD  Assessment/Plan  Left knee pain/prosthetic left knee infection on suppressive antibiotics:  -Patient seen by both orthopedic surgery and infectious disease. ID recommended discontinuing antibiotics for the next several days and repeating a joint aspiration in order to increase the yield on culture. That's her IV vancomycin and aztreonam have been discontinued. She has remained afebrile, nontoxic, hemodynamically stable in the last 24 hours. As a matter of fact she  Appears to be improved, as she ambulated with physical therapy today. Her knee pain is not as severe.  Shortness of breath:  -Likely secondary to pulmonary edema. I agree, pneumonia less likely. Clinical presentation does not seem to be consistent with pneumonia. Antimicrobial therapy has been discontinued. She reports improvement in her shortness of breath as she has been receiving Lasix 60 mg IV twice a day. Will transition to her or oral home regimen of Lasix at 40 mg by mouth twice a day.  Iron Deficiency Anemia:   -Likely secondary to chronic disease/chronic kidney disease. Because of her downward trend in hemoglobin to 7.1 with reports of shortness of breath, I transfuse her with 2 units of packed red blood cells yesterday. She reports feeling better today as her hemoglobin has improved to 8.7.  Paroxysmal A. fib:  -She presents with a supratherapeutic INR 3.14. Pharmacy consultation for Coumadin dosing. Coumadin has been held will follow up in a.m. PT/INR. Meanwhile she remains in sinus.   Type II DM/IDDM: -We'll continue Accu-Cheks q. a.c. and each bedtime with sliding scale coverage  Acute on chronic combined CHF -ProBNP increasing to 1789 with a chest x-ray showing findings consistent with congestion. Patient reporting feeling better for which I will transition her to oral Lasix at 40 mg by mouth twice a  day  Acute on chronic renal failure -Likely secondary to prerenal azotemia/volume depletion. Patient started IV Lasix on this hospitalization given clinical signs and symptoms consistent with acute decompensated heart failure. Her hemoglobin trended up to 2.3 today. Will discontinue IV Lasix and start her on oral Lasix at 40 mg by mouth twice a day   Code Status: Full code Family Communication: Discussed with patient plan Disposition Plan:  Plan to continue empiric antibiotic therapy, touch base with infectious disease regarding severe left knee pain. Given increasing shortness of breath with a downward trend in hemoglobin, we'll transfuse packed red blood cells. Continue IV diuresis for probable acute decompensated heart failure.   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 She presented to the department overnight with complaints of severe left knee pain with associated dyspnea. She'll has a history of chronic anemia, per medical records have a baseline creatinine of 7.5. Patient on chronic anticoagulation for paroxysmal atrial fibrillation. This morning patient reporting having severe pain to her left knee, having difficulties getting out of bed to bedside commode. She is currently afebrile and hemodynamically stable.  Objective: Filed Vitals:   08/05/13 1300  BP: 142/74  Pulse: 73  Temp: 98.3 F (36.8 C)  Resp:     Intake/Output Summary (Last 24 hours) at  08/05/13 1531 Last data filed at 08/05/13 1400  Gross per 24 hour  Intake   1080 ml   Output   3300 ml  Net  -2220 ml   Filed Weights   08/03/13 1648 08/04/13 0359 08/05/13 0518  Weight: 121.6 kg (268 lb 1.3 oz) 124.5 kg (274 lb 7.6 oz) 126.5 kg (278 lb 14.1 oz)    Exam:   General:  Patient is in no acute distress she is awake alert oriented. We opted out of bed to bedside commode this morning.  Cardiovascular: Regular rate rhythm normal S1-S2 no murmurs rubs or gallops  Respiratory: Patient having by basilar crackles, a few expiratory wheezes.  Abdomen: Obese soft nontender nondistended positive bowel sounds  Musculoskeletal: Patient reporting severe pain to her left knee, limiting her ability to flex and extend her lower leg  Skin: Patient having psoriatic plaques involving the extensor surfaces  Data Reviewed: Basic Metabolic Panel:  Recent Labs Lab 08/03/13 1122 08/04/13 0713 08/05/13 0415  NA 142 141 140  K 4.8 4.8 5.0  CL 108 107 105  CO2 25 26 26   GLUCOSE 94 102* 64*  BUN 33* 31* 34*  CREATININE 1.92* 2.01* 2.36*  CALCIUM 9.0 8.5 8.7   Liver Function Tests: No results found for this basename: AST, ALT, ALKPHOS, BILITOT, PROT, ALBUMIN,  in the last 168 hours No results found for this basename: LIPASE, AMYLASE,  in the last 168 hours No results found for this basename: AMMONIA,  in the last 168 hours CBC:  Recent Labs Lab 08/03/13 1122 08/04/13 0713 08/04/13 2230 08/05/13 0415  WBC 8.6 8.1 9.4 8.5  NEUTROABS 7.5  --   --   --   HGB 7.5* 7.1* 8.7* 8.7*  HCT 25.0* 24.2* 28.3* 28.7*  MCV 71.8* 72.9* 75.1* 75.1*  PLT 346 312 312 317   Cardiac Enzymes:  Recent Labs Lab 08/03/13 1122  TROPONINI <0.30   BNP (last 3 results)  Recent Labs  02/15/13 0434 07/05/13 1536 08/03/13 1122  PROBNP 438.6* 1581.0* 1789.0*   CBG:  Recent Labs Lab 08/04/13 1119 08/04/13 1625 08/04/13 2100 08/05/13 0553 08/05/13 1134  GLUCAP 100* 93 145* 71 86    Recent Results (from the past 240 hour(s))  MRSA PCR SCREENING     Status: Abnormal    Collection Time    08/03/13  4:56 PM      Result Value Range Status   MRSA by PCR POSITIVE (*) NEGATIVE Final   Comment:            The GeneXpert MRSA Assay (FDA     approved for NASAL specimens     only), is one component of a     comprehensive MRSA colonization     surveillance program. It is not     intended to diagnose MRSA     infection nor to guide or     monitor treatment for     MRSA infections.     RESULT CALLED TO, READ BACK BY AND VERIFIED WITH:     Dorthey Sawyer RN 18:45 08/03/13 ([email or web address])     Studies: No results found.  Scheduled Meds: . allopurinol  100 mg Oral Daily  . amiodarone  200 mg Oral Daily  . amLODipine  10 mg Oral Daily  . atorvastatin  20 mg Oral QHS  . Chlorhexidine Gluconate Cloth  6 each Topical Q0600  . ferrous sulfate  325 mg Oral TID WC  . furosemide  40 mg Oral BID  .  hydrALAZINE  50 mg Oral TID  . insulin aspart  0-5 Units Subcutaneous QHS  . insulin aspart  0-9 Units Subcutaneous TID WC  . insulin glargine  20 Units Subcutaneous BID  . mupirocin ointment   Nasal BID  . pantoprazole  40 mg Oral Daily  . sodium chloride  3 mL Intravenous Q12H  . spironolactone  25 mg Oral BID  . Warfarin - Pharmacist Dosing Inpatient   Does not apply q1800   Continuous Infusions:   Active Problems:   HYPERLIPIDEMIA   OBSTRUCTIVE SLEEP APNEA   HYPERTENSION   Obesity hypoventilation syndrome   Prosthetic joint infection   Gout   DM (diabetes mellitus)   Chronic anticoagulation   Chronic venous insufficiency   CKD (chronic kidney disease), stage IV   Anemia of chronic disease   PAF (paroxysmal atrial fibrillation)   Psoriasis   Morbid obesity   HCAP (healthcare-associated pneumonia)   Systolic and diastolic CHF, acute on chronic    Time spent: 35    Jeralyn Bennett  [facility] Pager [phone]. If 7PM-7AM, please contact night-coverage at [email or web address], password [facility] 08/05/2013, 3:31 PM  LOS: 2 days

## 2013-08-05 NOTE — Progress Notes (Signed)
Inpatient Diabetes Program Recommendations  AACE/ADA: New Consensus Statement on Inpatient Glycemic Control (2013)  Target Ranges:  Prepandial:   less than 140 mg/dL      Peak postprandial:   less than 180 mg/dL (1-2 hours)      Critically ill patients:  140 - 180 mg/dL   Reason for Assessment:  Lab glucose this morning of 64 mg/dl   Inpatient Diabetes Program Recommendations Insulin - Basal: Currently on home dose of Lantus 20 units BID. Correction (SSI): Currently on sensitive correction scale HgbA1C: Please consider ordering Hbg A1C Diet: Eating 75 to 100% of a Heart Healthy diet.  Note:  Results for [name], [name] (MRN [id number]) as of 08/05/2013 11:59  Ref. Range 08/04/2013 06:13 08/04/2013 11:19 08/04/2013 16:25 08/04/2013 21:00 08/05/2013 05:53 08/05/2013 11:34  Glucose-Capillary Latest Range: 70-99 mg/dL 99 956 (H) 93 213 (H) 71 86   Recommendations: Given patient's renal disease, request MD consider decreasing Lantus dose 15 to 18 units BID and continue further titrating of dosage based on CBG's.  Request Hgb A1C.  Request MD consider changing diet order to [facility] Modified Medium.  Thank you.  Jamesa Tedrick S. Elsie Lincoln, RN, CNS, CDE Inpatient Diabetes Program, team pager [phone]

## 2013-08-05 NOTE — Progress Notes (Signed)
Report given to receiving RN. Patient stable. No signs or symptoms of distress or discomfort.

## 2013-08-05 NOTE — Care Management Note (Signed)
    Page 1 of 1   08/05/2013     1:55:02 PM   CARE MANAGEMENT NOTE 08/05/2013  Patient:  [name], [name]   Account Number:  [id number]  Date Initiated:  08/05/2013  Documentation initiated by:  Tera Mater  Subjective/Objective Assessment:   62yo female admitted with PNA.  Pt. lives at home with spouse.     Action/Plan:   discharge planning   Anticipated DC Date:  08/07/2013   Anticipated DC Plan:  HOME W HOME HEALTH SERVICES      DC Planning Services  CM consult      [facility] Choice  HOME HEALTH   Choice offered to / List presented to:          [facility] arranged  HH-2 PT  HH-1 RN      [facility] agency  [facility]   Status of service:  In process, will continue to follow Medicare Important Message given?   (If response is "NO", the following Medicare IM given date fields will be blank) Date Medicare IM given:   Date Additional Medicare IM given:    Discharge Disposition:    Per UR Regulation:  Reviewed for med. necessity/level of care/duration of stay  If discussed at Long Length of Stay Meetings, dates discussed:    Comments:  8/'28/14 1145 In to speak with pt. about home health needs. Pt. stated she has had [facility] home health in past, and was satisfied with their services.  TC to [facility], with [facility], to give referral for [facility] RN/PT.  Pt. currently on IV lasix, and will be INPT for at least 1-2 more days. Tera Mater, RN, BSN NCM [phone]

## 2013-08-05 NOTE — Progress Notes (Signed)
ANTICOAGULATION CONSULT NOTE - Follow Up Consult  Pharmacy Consult for Coumadin Indication: atrial fibrillation  Allergies  Allergen Reactions  . Daptomycin Other (See Comments)    Elevated CPK  . Morphine And Related Nausea And Vomiting  . Cephalexin Rash    unknown  . Celecoxib     unknown  . Codeine     unknown  . Fluoxetine Hcl     unknown  . Latex     REACTION: undefined  . Ofloxacin     unknown  . Penicillins     unknown  . Rofecoxib     unknown  . Sulfonamide Derivatives     unknown    Patient Measurements: Height: 5\' 3"  (160 cm) Weight: 278 lb 14.1 oz (126.5 kg) IBW/kg (Calculated) : 52.4  Vital Signs: Temp: 98.2 F (36.8 C) (08/28 0518) Temp src: Oral (08/28 0518) BP: 141/76 mmHg (08/28 0518) Pulse Rate: 69 (08/28 0518)  Labs:  Recent Labs  08/03/13 1122 08/03/13 1404 08/04/13 0713 08/04/13 2230 08/05/13 0415  HGB 7.5*  --  7.1* 8.7* 8.7*  HCT 25.0*  --  24.2* 28.3* 28.7*  PLT 346  --  312 312 317  APTT  --  73*  --   --   --   LABPROT  --  31.1* 33.7*  --  32.2*  INR  --  3.14* 3.48*  --  3.28*  CREATININE 1.92*  --  2.01*  --  2.36*  TROPONINI <0.30  --   --   --   --     Estimated Creatinine Clearance: 31.6 ml/min (by C-G formula based on Cr of 2.36).   Assessment: 62 y/o female on chronic Coumadin for Afib, admitted with worsening L knee pain and dyspnea. INR remains supratherapeutic at 3.28. CBC is stable, no bleeding noted. Of note, pt started on fluconazole which can increase the INR.   Goal of Therapy:  INR 2-3   Plan:  1. No coumadin tonight 2. F/u AM INR  Lysle Shania, PharmD, BCPS Pager # [phone] 08/05/2013 8:43 AM

## 2013-08-05 NOTE — Progress Notes (Signed)
Physical Therapy Treatment Patient Details Name: [name] MRN: [id number] DOB: [birth year] Today's Date: 08/05/2013 Time: 4098-1191 PT Time Calculation (min): 39 min  PT Assessment / Plan / Recommendation  History of Present Illness Admitted with SOB and L LE pain (chronic).  Hx of anemia, morbid obesity, CHF, OSA, DM, HTN.  Was hospitalized approximately 1 month ago.   PT Comments   Pt cont's to be limited by LLE & SOB with minimal activity however she was able to increase ambulation distance today.  Increased tx time due to pt having BM prior to amb & requiring total assist for pericare.     Follow Up Recommendations  Home health PT;Supervision/Assistance - 24 hour     Does the patient have the potential to tolerate intense rehabilitation     Barriers to Discharge        Equipment Recommendations  None recommended by PT    Recommendations for Other Services    Frequency Min 3X/week   Progress towards PT Goals Progress towards PT goals: Progressing toward goals  Plan Current plan remains appropriate    Precautions / Restrictions Precautions Precautions: Fall Restrictions Weight Bearing Restrictions: No   Pertinent Vitals/Pain 3/4 dyspnea with activity.  Unable to speak complete sentence.  Sp02 96% 3L immediately after returning back to room from ambulating     Mobility  Bed Mobility Bed Mobility: Supine to Sit;Sitting - Scoot to Edge of Bed Supine to Sit: 3: Mod assist;HOB elevated;With rails Sitting - Scoot to Edge of Bed: 4: Min assist Details for Bed Mobility Assistance: Pt states she usually sleeps in recliner at home therefore HOB elevated.  (A) to lift shoulders/trunk to sitting upright & to move LLE to EOB.   Transfers Transfers: Sit to Stand;Stand to Sit Sit to Stand: 4: Min guard;With upper extremity assist;With armrests;From bed;From chair/3-in-1 Stand to Sit: 4: Min guard;With upper extremity assist;With armrests;To chair/3-in-1 Details for Transfer  Assistance: uses momentum, verbal cues for position of body to sitting surface Ambulation/Gait Ambulation/Gait Assistance: 4: Min guard Ambulation Distance (Feet): 40 Feet Assistive device: Rolling walker Ambulation/Gait Assistance Details: antalgic due to LLE pain, heavy reliance on UE's with use of RW, 3/4 dyspnea Gait Pattern: Step-to pattern;Antalgic;Decreased stance time - left;Decreased step length - right;Decreased hip/knee flexion - left;Decreased hip/knee flexion - right;Decreased weight shift to left Gait velocity: slow General Gait Details: Sp02 96% 3L immediately returning back into room after ambulation Stairs: No Wheelchair Mobility Wheelchair Mobility: No      PT Goals (current goals can now be found in the care plan section) Acute Rehab PT Goals Patient Stated Goal: return home PT Goal Formulation: With patient Time For Goal Achievement: 08/11/13 Potential to Achieve Goals: Fair  Visit Information  Last PT Received On: 08/05/13 Assistance Needed: +1 History of Present Illness: Admitted with SOB and L LE pain (chronic).  Hx of anemia, morbid obesity, CHF, OSA, DM, HTN.  Was hospitalized approximately 1 month ago.    Subjective Data  Patient Stated Goal: return home   Cognition  Cognition Arousal/Alertness: Awake/alert Behavior During Therapy: WFL for tasks assessed/performed Overall Cognitive Status: Within Functional Limits for tasks assessed    Balance     End of Session PT - End of Session Equipment Utilized During Treatment: Oxygen (3L 02) Activity Tolerance: Patient limited by fatigue;Patient limited by pain;Patient tolerated treatment well Patient left: in chair;with call bell/phone within reach Nurse Communication: Mobility status   GP     Lara Mulch 08/05/2013, 9:52 AM  Verdell Face, [place] [phone] 08/05/2013

## 2013-08-06 DIAGNOSIS — T889XXS Complication of surgical and medical care, unspecified, sequela: Secondary | ICD-10-CM

## 2013-08-06 DIAGNOSIS — I5041 Acute combined systolic (congestive) and diastolic (congestive) heart failure: Secondary | ICD-10-CM

## 2013-08-06 DIAGNOSIS — I4891 Unspecified atrial fibrillation: Secondary | ICD-10-CM

## 2013-08-06 LAB — CBC
HCT: 30.1 % — ABNORMAL LOW (ref 36.0–46.0)
Hemoglobin: 9.2 g/dL — ABNORMAL LOW (ref 12.0–15.0)
RBC: 3.97 MIL/uL (ref 3.87–5.11)
WBC: 7.9 10*3/uL (ref 4.0–10.5)

## 2013-08-06 LAB — BASIC METABOLIC PANEL
BUN: 38 mg/dL — ABNORMAL HIGH (ref 6–23)
CO2: 24 mEq/L (ref 19–32)
Chloride: 103 mEq/L (ref 96–112)
GFR calc Af Amer: 20 mL/min — ABNORMAL LOW (ref >90)
Glucose, Bld: 66 mg/dL — ABNORMAL LOW (ref 70–99)
Potassium: 4.8 mEq/L (ref 3.5–5.1)

## 2013-08-06 LAB — GLUCOSE, CAPILLARY
Glucose-Capillary: 98 mg/dL (ref 70–99)
Glucose-Capillary: 104 mg/dL — ABNORMAL HIGH (ref 70–99)
Glucose-Capillary: 138 mg/dL — ABNORMAL HIGH (ref 70–99)

## 2013-08-06 LAB — PROTIME-INR: INR: 3.06 — ABNORMAL HIGH (ref 0.00–1.49)

## 2013-08-06 MED ORDER — FUROSEMIDE 40 MG PO TABS: 40.0000 mg | ORAL_TABLET | Freq: Every day | ORAL | Status: DC

## 2013-08-06 NOTE — Progress Notes (Signed)
Pt is alert and oriented x4. Pt is on 3L [place] which she is also on at home. Pt is having pain in her left knee. Pt is able to apply pressure when standing but with discomfort. Ortho is supposed to see the patient for a joint tap. Will continue to monitor

## 2013-08-06 NOTE — Progress Notes (Signed)
[facility] PROGRESS NOTE  [name] JXB:[id number] DOB: Feb 05, 1951 DOA: 08/03/2013 PCP: [name] [name], MD  Assessment/Plan  Left knee pain/prosthetic left knee infection on suppressive antibiotics:  -Patient seen by both orthopedic surgery and infectious disease. ID recommended discontinuing antibiotics for at least 48 hours and repeating a joint aspiration in order to increase the yield on culture. IV vancomycin and aztreonam have been discontinued. She has remained afebrile, nontoxic, hemodynamically stable . Acute on chronic renal failure Patient [name] continues to trend up going from 2.36 on 08/05/2013 to 2.7 on this mornings lab work. Likely secondary to IV Lasix administration. During this hospitalization she was treated with 60 mg IV twice a day Lasix 4 acute decompensated congestive heart failure. I transitioned her to oral Lasix listed at 40 mg twice a day. Will discontinue spironolactone and and hold this evening's dose of Lasix. Will reassess her kidney function tomorrow.   Shortness of breath Patient reporting significant improvement of dyspnea. Likely secondary to pulmonary edema. I am holding diuretic therapy due to worsening kidney function.    Iron Deficiency Anemia:   Likely secondary to chronic disease/chronic kidney disease. Because of her downward trend in hemoglobin to 7.1 with reports of shortness of breath, I transfuse her with 2 units of packed red blood cells yesterday. She reports feeling better today as her hemoglobin has improved to 8.7.  Paroxysmal A. fib:  Patient on chronic anticoagulation with Coumadin, pharmacy adjusting warfarin.   Type II DM/IDDM: We'll continue Accu-Cheks q. a.c. and each bedtime with sliding scale coverage  Acute on chronic combined CHF ProBNP increasing to 1789 with a chest x-ray showing findings consistent with congestion. She reports significant improvement today with clear lung sounds on exam. I am holding Lasix for now  due to 2 worsening kidney function. We'll follow up in a.m. BMP.   Code Status: Full code Family Communication: Discussed with patient plan Disposition Plan:  Plan to continue empiric antibiotic therapy, touch base with infectious disease regarding severe left knee pain. Given increasing shortness of breath with a downward trend in hemoglobin, we'll transfuse packed red blood cells. Continue IV diuresis for probable acute decompensated heart failure.   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 She presented to the department overnight with complaints of severe left knee pain with associated dyspnea. She'll has a history of chronic anemia, per medical records have a baseline creatinine of 7.5. Patient on chronic anticoagulation for paroxysmal atrial fibrillation. This morning patient reporting having severe pain to her left knee, having difficulties getting out of bed to bedside commode. She is currently afebrile and hemodynamically stable.  Objective: Filed Vitals:   08/06/13 1433  BP: 147/62  Pulse: 67  Temp: 97.7 F (36.5 C)  Resp: 17    Intake/Output Summary (Last 24 hours) at 08/06/13 1548 Last data filed at 08/06/13 1433  Gross per 24 hour  Intake    820 ml  Output   1950 ml  Net  -1130 ml   Filed Weights   08/04/13 0359 08/05/13 0518 08/06/13 0431  Weight: 124.5 kg (274 lb 7.6 oz) 126.5 kg (278 lb 14.1 oz)  124.603 kg (274 lb 11.2 oz)    Exam:   General:  Patient is in no acute distress she is awake alert oriented. We opted  out of bed to bedside commode this morning.  Cardiovascular: Regular rate rhythm normal S1-S2 no murmurs rubs or gallops  Respiratory: Patient having by basilar crackles, a few expiratory wheezes.  Abdomen: Obese soft nontender nondistended positive bowel sounds  Musculoskeletal: Patient reporting severe pain to her left knee, limiting her ability to flex and extend her lower leg  Skin: Patient having psoriatic plaques involving the extensor surfaces  Data Reviewed: Basic Metabolic Panel:  Recent Labs Lab 08/03/13 1122 08/04/13 0713 08/05/13 0415 08/06/13 0610  NA 142 141 140 140  K 4.8 4.8 5.0 4.8  CL 108 107 105 103  CO2 25 26 26 24   GLUCOSE 94 102* 64* 66*  BUN 33* 31* 34* 38*  CREATININE 1.92* 2.01* 2.36* 2.70*  CALCIUM 9.0 8.5 8.7 8.7   Liver Function Tests: No results found for this basename: AST, ALT, ALKPHOS, BILITOT, PROT, ALBUMIN,  in the last 168 hours No results found for this basename: LIPASE, AMYLASE,  in the last 168 hours No results found for this basename: AMMONIA,  in the last 168 hours CBC:  Recent Labs Lab 08/03/13 1122 08/04/13 0713 08/04/13 2230 08/05/13 0415 08/06/13 0610  WBC 8.6 8.1 9.4 8.5 7.9  NEUTROABS 7.5  --   --   --   --   HGB 7.5* 7.1* 8.7* 8.7* 9.2*  HCT 25.0* 24.2* 28.3* 28.7* 30.1*  MCV 71.8* 72.9* 75.1* 75.1* 75.8*  PLT 346 312 312 317 347   Cardiac Enzymes:  Recent Labs Lab 08/03/13 1122  TROPONINI <0.30   BNP (last 3 results)  Recent Labs  02/15/13 0434 07/05/13 1536 08/03/13 1122  PROBNP 438.6* 1581.0* 1789.0*   CBG:  Recent Labs Lab 08/05/13 1134 08/05/13 1640 08/05/13 2109 08/06/13 0549 08/06/13 1135  GLUCAP 86 88 115* 98 104*    Recent Results (from the past 240 hour(s))  MRSA PCR SCREENING     Status: Abnormal   Collection Time    08/03/13  4:56 PM      Result Value Range Status   MRSA by PCR POSITIVE (*) NEGATIVE Final   Comment:            The GeneXpert MRSA Assay (FDA     approved for  NASAL specimens     only), is one component of a     comprehensive MRSA colonization     surveillance program. It is not     intended to diagnose MRSA     infection nor to guide or     monitor treatment for     MRSA infections.     RESULT CALLED TO, READ BACK BY AND VERIFIED WITH:     Dorthey Sawyer RN 18:45 08/03/13 ([email or web address])     Studies: No results found.  Scheduled Meds: . allopurinol  100 mg Oral Daily  . amiodarone  200 mg Oral Daily  . amLODipine  10 mg Oral Daily  . atorvastatin  20 mg Oral QHS  . Chlorhexidine Gluconate Cloth  6 each Topical Q0600  . ferrous sulfate  325 mg Oral TID WC  . [START ON 08/07/2013] furosemide  40 mg Oral Daily  . hydrALAZINE  50 mg Oral TID  . insulin aspart  0-5 Units Subcutaneous QHS  . insulin aspart  0-9 Units Subcutaneous TID WC  . insulin glargine  20 Units  Subcutaneous BID  . mupirocin ointment   Nasal BID  . pantoprazole  40 mg Oral Daily  . sodium chloride  3 mL Intravenous Q12H  . Warfarin - Pharmacist Dosing Inpatient   Does not apply q1800   Continuous Infusions:   Active Problems:   HYPERLIPIDEMIA   OBSTRUCTIVE SLEEP APNEA   HYPERTENSION   Obesity hypoventilation syndrome   Prosthetic joint infection   Gout   DM (diabetes mellitus)   Chronic anticoagulation   Chronic venous insufficiency   CKD (chronic kidney disease), stage IV   Anemia of chronic disease   PAF (paroxysmal atrial fibrillation)   Psoriasis   Morbid obesity   HCAP (healthcare-associated pneumonia)   Systolic and diastolic CHF, acute on chronic    Time spent: 35    Jeralyn Bennett  [facility] Pager [phone]. If 7PM-7AM, please contact night-coverage at [email or web address], password [facility] 08/06/2013, 3:48 PM  LOS: 3 days

## 2013-08-06 NOTE — Plan of Care (Signed)
Problem: Phase I Progression Outcomes Goal: EF % per last Echo/documented,Core Reminder form on chart Outcome: Completed/Met Date Met:  08/06/13 EF 40-45% per echo on 02/11/13  Goal: Up in chair, BRP Outcome: Progressing Pt OOB to BSC and chair during day with 1-2 assist. Pt refuses to get OOB at night

## 2013-08-06 NOTE — Progress Notes (Signed)
ANTICOAGULATION CONSULT NOTE - Follow Up Consult  Pharmacy Consult for Coumadin Indication: atrial fibrillation  Allergies  Allergen Reactions  . Daptomycin Other (See Comments)    Elevated CPK  . Morphine And Related Nausea And Vomiting  . Cephalexin Rash    unknown  . Celecoxib     unknown  . Codeine     unknown  . Fluoxetine Hcl     unknown  . Latex     REACTION: undefined  . Ofloxacin     unknown  . Penicillins     unknown  . Rofecoxib     unknown  . Sulfonamide Derivatives     unknown    Patient Measurements: Height: 5\' 3"  (160 cm) Weight: 274 lb 11.2 oz (124.603 kg) (bed scale; pt refuses to stand on scale) IBW/kg (Calculated) : 52.4  Vital Signs: Temp: 97.9 F (36.6 C) (08/29 0434) Temp src: Oral (08/29 0434) BP: 145/73 mmHg (08/29 0434) Pulse Rate: 74 (08/29 0434)  Labs:  Recent Labs  08/03/13 1122  08/03/13 1404 08/04/13 0713 08/04/13 2230 08/05/13 0415 08/06/13 0610  HGB 7.5*  --   --  7.1* 8.7* 8.7* 9.2*  HCT 25.0*  --   --  24.2* 28.3* 28.7* 30.1*  PLT 346  --   --  312 312 317 347  APTT  --   --  73*  --   --   --   --   LABPROT  --   < > 31.1* 33.7*  --  32.2* 30.5*  INR  --   < > 3.14* 3.48*  --  3.28* 3.06*  CREATININE 1.92*  --   --  2.01*  --  2.36* 2.70*  TROPONINI <0.30  --   --   --   --   --   --   < > = values in this interval not displayed.  Estimated Creatinine Clearance: 27.4 ml/min (by C-G formula based on Cr of 2.7).    62 yo F admitted 08/03/2013  worsening L knee pain.and worsening SOB x4 days.  Pharmacy consulted to dose vancomycin, and warfarin.  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 250mg  qd, - Hx HF EF 40%, recent hospitalization Tx HCAP -Cxr R infiltrate;  obesity, OSA, HF, HTN, HLD, CKD, PAF, GBS - prosthetic L knee infection 2009 suppressive Tx with Levoflox since - sees [facility]  Events: Per ID, to stop abx x 48h and have ortho to aspirate join, [facility] MD to follow up plan for  anticoagulation around joint aspiration  AC:  hx afib - INR remains elevated 3.28, H/H 8.7/28.7, plts 317, no bleeding noted - on fluconazole + amio  ID: skin fungal infxn; Joint infection - Afebrile, WBC WNL, Scrtrend up - ID recommended DC abx and culture knee fluid off abx x several days  8/26 Aztreonam 2gm q8 >>8/27 8/26 vanc 2.5Gm x1 then 1500 q24>>8/27 8/26 diflucan 400mg  x1 then 200 q24>>  8/26 MRSA PCR + Legionella neg  CV: Hx CHF (EF 40-45%), HTN, HLD, afib - BP above goal HR 60-70s - amio, amlodipine, lipitor, lasix, hydralazine, spironolactone (watch Scr)  Endo: Hx DM - CBGs good on SSI + lantus  GI/Nutr: Cardiac diet - PPI, no LFTs  Renal: Hx CKD stage 3, gout - crcl 27, lytes ok, allopurinol  Heme/Onc: Hx anemia - ferrous sulfate  Best practices: Coumadin  Goal of Therapy:  INR 2-3   Plan:  1. No coumadin tonight; follow up plan for  coag around joint procedure 2. F/u AM INR   Thank you for allowing pharmacy to be a part of this patients care team.  Lovenia Kim Pharm.D., BCPS Clinical Pharmacist 08/06/2013 9:10 AM Pager: [phone] Phone: [phone]

## 2013-08-06 NOTE — Progress Notes (Signed)
Occupational Therapy Treatment Patient Details Name: [name] MRN: [id number] DOB: Sep 18, 1951 Today's Date: 08/06/2013 Time: 4098-1191 OT Time Calculation (min): 20 min  OT Assessment / Plan / Recommendation  History of present illness Admitted with SOB and L LE pain (chronic).  Hx of anemia, morbid obesity, CHF, OSA, DM, HTN.  Was hospitalized approximately 1 month ago. Trying to figure out if there is anything else they can do for her knee.   OT comments  Pt making progress; however still limited by knee pain which makes her at an increased fall risk   Follow Up Recommendations  Home health OT       Equipment Recommendations  None recommended by OT          Progress towards OT Goals Progress towards OT goals: Progressing toward goals  Plan Discharge plan needs to be updated    Precautions / Restrictions Precautions Precautions: Fall Restrictions Weight Bearing Restrictions: No       ADL  Toilet Transfer: Hydrographic surveyor Method: Surveyor, minerals: Materials engineer and Hygiene: +1 Total assistance (hygiene; pt states she can do it in her own setup at home) Where Assessed - Toileting Clothing Manipulation and Hygiene: Sit to stand from 3-in-1 or toilet Equipment Used: Reacher Transfers/Ambulation Related to ADLs: min guard A stand pivot to/from 3N1 ADL Comments: Demonstrated reacher to pt and had her remove her socks with it as well as simulate with a pillow case as if she were doning underwear or pants; pt on first use is a min A level. Did not have a wide sock aide with me and pt is on contact precautions so could not let her pratice with one anyway. Made pt aware she can get the whole AE kit in the gift shop or just a reacher and/or a sock aide--if she gets a sock aide we will change out a wide for her even swap for the regular one.      OT Goals(current goals can now be found in the care plan  section) Acute Rehab OT Goals Potential to Achieve Goals: Good  Visit Information  Last OT Received On: 08/06/13 Assistance Needed: +1 History of Present Illness: Admitted with SOB and L LE pain (chronic).  Hx of anemia, morbid obesity, CHF, OSA, DM, HTN.  Was hospitalized approximately 1 month ago. Trying to figure out if there is anything else they can do for her knee.          Cognition  Cognition Arousal/Alertness: Awake/alert Behavior During Therapy: WFL for tasks assessed/performed Overall Cognitive Status: Within Functional Limits for tasks assessed    Mobility  Transfers Transfers: Sit to Stand;Stand to Sit Sit to Stand: 4: Min guard;With upper extremity assist;With armrests;From chair/3-in-1 Stand to Sit: 4: Min guard;With upper extremity assist;With armrests;To chair/3-in-1          End of Session OT - End of Session Activity Tolerance: Patient limited by pain (in left knee) Patient left: in chair    Evette Georges [phone] 08/06/2013, 2:39 PM

## 2013-08-07 DIAGNOSIS — J9600 Acute respiratory failure, unspecified whether with hypoxia or hypercapnia: Secondary | ICD-10-CM

## 2013-08-07 LAB — CBC
HCT: 30.1 % — ABNORMAL LOW (ref 36.0–46.0)
Hemoglobin: 9 g/dL — ABNORMAL LOW (ref 12.0–15.0)
MCV: 77 fL — ABNORMAL LOW (ref 78.0–100.0)
RDW: 22.1 % — ABNORMAL HIGH (ref 11.5–15.5)
WBC: 7.1 10*3/uL (ref 4.0–10.5)

## 2013-08-07 LAB — GLUCOSE, CAPILLARY
Glucose-Capillary: 107 mg/dL — ABNORMAL HIGH (ref 70–99)
Glucose-Capillary: 118 mg/dL — ABNORMAL HIGH (ref 70–99)

## 2013-08-07 LAB — BASIC METABOLIC PANEL
BUN: 43 mg/dL — ABNORMAL HIGH (ref 6–23)
CO2: 26 mEq/L (ref 19–32)
Chloride: 104 mEq/L (ref 96–112)
Creatinine, Ser: 2.56 mg/dL — ABNORMAL HIGH (ref 0.50–1.10)
Potassium: 4.6 mEq/L (ref 3.5–5.1)

## 2013-08-07 LAB — PROTIME-INR: INR: 3.11 — ABNORMAL HIGH (ref 0.00–1.49)

## 2013-08-07 MED ORDER — METOPROLOL TARTRATE 25 MG PO TABS
25.0000 mg | ORAL_TABLET | Freq: Two times a day (BID) | ORAL | Status: DC
  Administered 2013-08-07 – 2013-08-08 (×3): 25 mg via ORAL
  Filled 2013-08-07 (×6): qty 1

## 2013-08-07 MED ORDER — LEVOFLOXACIN 250 MG PO TABS
250.0000 mg | ORAL_TABLET | Freq: Every day | ORAL | Status: DC
  Administered 2013-08-07 – 2013-08-08 (×2): 250 mg via ORAL
  Filled 2013-08-07 (×2): qty 1

## 2013-08-07 MED ORDER — FUROSEMIDE 40 MG PO TABS
40.0000 mg | ORAL_TABLET | Freq: Two times a day (BID) | ORAL | Status: DC
  Administered 2013-08-07 – 2013-08-08 (×2): 40 mg via ORAL
  Filled 2013-08-07 (×4): qty 1

## 2013-08-07 NOTE — Progress Notes (Signed)
ANTICOAGULATION CONSULT NOTE - Follow Up Consult  Pharmacy Consult for Coumadin Indication: atrial fibrillation  Allergies  Allergen Reactions  . Daptomycin Other (See Comments)    Elevated CPK  . Morphine And Related Nausea And Vomiting  . Cephalexin Rash    unknown  . Celecoxib     unknown  . Codeine     unknown  . Fluoxetine Hcl     unknown  . Latex     REACTION: undefined  . Ofloxacin     unknown  . Penicillins     unknown  . Rofecoxib     unknown  . Sulfonamide Derivatives     unknown    Patient Measurements: Height: 5\' 3"  (160 cm) Weight: 266 lb 1.5 oz (120.7 kg) (pt refuse to be weight on standing scale) IBW/kg (Calculated) : 52.4  Vital Signs: Temp: 97.6 F (36.4 C) (08/30 0446) Temp src: Oral (08/30 0446) BP: 151/74 mmHg (08/30 1120) Pulse Rate: 72 (08/30 0446)  Labs:  Recent Labs  08/05/13 0415 08/06/13 0610 08/07/13 0500  HGB 8.7* 9.2* 9.0*  HCT 28.7* 30.1* 30.1*  PLT 317 347 324  LABPROT 32.2* 30.5* 30.9*  INR 3.28* 3.06* 3.11*  CREATININE 2.36* 2.70* 2.56*    Estimated Creatinine Clearance: 28.3 ml/min (by C-G formula based on Cr of 2.56).   Assessment: 62 y/o female on chronic Coumadin for Afib. INR remains supratherapeutic at 3.11. CBC is stable, no bleeding noted. Patient is s/p unsuccessful knee aspiration.  Goal of Therapy:  INR 2-3   Plan:  1. No Coumadin tonight 2. F/u AM INR  [facility], [address].D., BCPS Clinical Pharmacist Pager: [phone] 08/07/2013 1:07 PM

## 2013-08-07 NOTE — Progress Notes (Signed)
Rounding given, pt resting comfortable on bed, no family member at the bedside, no distress noticed. We'll continue with POC.

## 2013-08-07 NOTE — Progress Notes (Signed)
[facility] PROGRESS NOTE  [name] ZOX:[id number] DOB: Mar 22, 1951 DOA: 08/03/2013 PCP: [name] [name], MD  Assessment/Plan  Left knee pain/prosthetic left knee infection on suppressive antibiotics:  -Patient remaining stable over the last 24 hours. She had been off of antibiotic therapy during this time as the feeding surgery attempted to aspirate knee. Unfortunately this was a dry tap, and thus no fluid could be sent off for cultures. I discussed case with infectious disease, recommending restarting Levaquin therapy. . Acute on chronic renal failure Likely secondary to diuresis, with a creatinine trending down to 2.5 from 2.7 yesterday. During this hospitalization she was treated with 60 mg IV twice a day Lasix for acute decompensated congestive heart failure. I transitioned her to oral Lasix  at 40 mg twice a day. Then because of worsening kidney function I discontinue spironolactone and held yesterday's evening dose of Lasix.    Shortness of breath Patient reporting significant improvement of dyspnea. Likely secondary to pulmonary edema. Diuretic therapy was helping to worsening kidney function.   Iron Deficiency Anemia:   Likely secondary to chronic disease/chronic kidney disease. Because of her downward trend in hemoglobin to 7.1 with reports of shortness of breath, I transfuse her with 2 units of packed red blood cells yesterday. Hemoglobin remaining stable, patient reports significant improvement after receiving blood  Paroxysmal A. fib:  Patient on chronic anticoagulation with Coumadin, pharmacy adjusting warfarin.   Type II DM/IDDM: We'll continue Accu-Cheks q. a.c. and each bedtime with sliding scale coverage  Acute on chronic combined CHF ProBNP increasing to 1789 with a chest x-ray showing findings consistent with congestion. She reports significant improvement with clear lung sounds on exam. Plan to restart oral Lasix today.   Code Status: Full code Family  Communication: Discussed with patient plan Disposition Plan:  Plan to continue empiric antibiotic therapy, touch base with infectious disease regarding severe left knee pain. Given increasing shortness of breath with a downward trend in hemoglobin, we'll transfuse packed red blood cells. Continue IV diuresis for probable acute decompensated heart failure.   Consultants: Infectious disease  Procedures:  Blood transfusion  Antibiotics:  Vancomycin thousand 500 mg IV every 24 hours (discontinued on 08/04/2013)  Aztreonam 2 g IV every 8 hour (discontinued on 08/04/2013)  HPI/Subjective: No acute events, patient today reports doing well, at her baseline. Orthopedic surgery attempted to aspirate knee however insufficient amount of fluid drawn to send off for cultures. She has no new complaints today.  Objective: Filed Vitals:   08/07/13 1120  BP: 151/74  Pulse:   Temp:   Resp:     Intake/Output Summary (Last 24 hours) at 08/07/13 1152 Last data filed at 08/07/13 1055  Gross per 24 hour  Intake    900 ml  Output   1401 ml  Net   -501 ml   Filed Weights   08/05/13 0518 08/06/13 0431 08/07/13 0300  Weight: 126.5 kg (278 lb 14.1 oz) 124.603 kg (274 lb 11.2 oz) 120.7 kg (266 lb 1.5 oz)    Exam:   General:  Patient is in no acute distress she is awake alert oriented. We opted out of bed to bedside commode this morning.  Cardiovascular: Regular rate rhythm normal S1-S2 no murmurs rubs or gallops  Respiratory: Patient having by basilar crackles, a few expiratory wheezes.  Abdomen: Obese soft nontender nondistended positive bowel sounds  Musculoskeletal: Patient reporting severe pain to her left knee, limiting her ability to flex and extend her lower leg  Skin: Patient having  psoriatic plaques involving the extensor surfaces  Data Reviewed: Basic Metabolic Panel:  Recent Labs Lab 08/03/13 1122 08/04/13 0713 08/05/13 0415 08/06/13 0610 08/07/13 0500  NA 142 141 140  140 140  K 4.8 4.8 5.0 4.8 4.6  CL 108 107 105 103 104  CO2 25 26 26 24 26   GLUCOSE 94 102* 64* 66* 71  BUN 33* 31* 34* 38* 43*  CREATININE 1.92* 2.01* 2.36* 2.70* 2.56*  CALCIUM 9.0 8.5 8.7 8.7 8.7   Liver Function Tests: No results found for this basename: AST, ALT, ALKPHOS, BILITOT, PROT, ALBUMIN,  in the last 168 hours No results found for this basename: LIPASE, AMYLASE,  in the last 168 hours No results found for this basename: AMMONIA,  in the last 168 hours CBC:  Recent Labs Lab 08/03/13 1122 08/04/13 0713 08/04/13 2230 08/05/13 0415 08/06/13 0610 08/07/13 0500  WBC 8.6 8.1 9.4 8.5 7.9 7.1  NEUTROABS 7.5  --   --   --   --   --   HGB 7.5* 7.1* 8.7* 8.7* 9.2* 9.0*  HCT 25.0* 24.2* 28.3* 28.7* 30.1* 30.1*  MCV 71.8* 72.9* 75.1* 75.1* 75.8* 77.0*  PLT 346 312 312 317 347 324   Cardiac Enzymes:  Recent Labs Lab 08/03/13 1122  TROPONINI <0.30   BNP (last 3 results)  Recent Labs  02/15/13 0434 07/05/13 1536 08/03/13 1122  PROBNP 438.6* 1581.0* 1789.0*   CBG:  Recent Labs Lab 08/06/13 0549 08/06/13 1135 08/06/13 1620 08/06/13 2055 08/07/13 0549  GLUCAP 98 104* 138* 190* 74    Recent Results (from the past 240 hour(s))  MRSA PCR SCREENING     Status: Abnormal   Collection Time    08/03/13  4:56 PM      Result Value Range Status   MRSA by PCR POSITIVE (*) NEGATIVE Final   Comment:            The GeneXpert MRSA Assay (FDA     approved for NASAL specimens     only), is one component of a     comprehensive MRSA colonization     surveillance program. It is not     intended to diagnose MRSA     infection nor to guide or     monitor treatment for     MRSA infections.     RESULT CALLED TO, READ BACK BY AND VERIFIED WITH:     Dorthey Sawyer RN 18:45 08/03/13 ([email or web address])     Studies: No results found.  Scheduled Meds: . allopurinol  100 mg Oral Daily  . amiodarone  200 mg Oral Daily  . amLODipine  10 mg Oral Daily  . atorvastatin  20 mg Oral QHS  .  Chlorhexidine Gluconate Cloth  6 each Topical Q0600  . ferrous sulfate  325 mg Oral TID WC  . hydrALAZINE  50 mg Oral TID  . insulin aspart  0-5 Units Subcutaneous QHS  . insulin aspart  0-9 Units Subcutaneous TID WC  . insulin glargine  20 Units Subcutaneous BID  . mupirocin ointment   Nasal BID  . pantoprazole  40 mg Oral Daily  . sodium chloride  3 mL Intravenous Q12H  . Warfarin - Pharmacist Dosing Inpatient   Does not apply q1800   Continuous Infusions:   Active Problems:   HYPERLIPIDEMIA   OBSTRUCTIVE SLEEP APNEA   HYPERTENSION   Obesity hypoventilation syndrome   Prosthetic joint infection   Gout   DM (diabetes mellitus)   Chronic anticoagulation  Chronic venous insufficiency   CKD (chronic kidney disease), stage IV   Anemia of chronic disease   PAF (paroxysmal atrial fibrillation)   Psoriasis   Morbid obesity   HCAP (healthcare-associated pneumonia)   Systolic and diastolic CHF, acute on chronic    Time spent: 35    Jeralyn Bennett  [facility] Pager [phone]. If 7PM-7AM, please contact night-coverage at [email or web address], password [facility] 08/07/2013, 11:52 AM  LOS: 4 days

## 2013-08-07 NOTE — Progress Notes (Signed)
Patient ID: [name], female   DOB: [birth year], 62 y.o.   MRN: [id number] I tried to aspirate her knee, but it was a dry tap.  I definitely was in the joint, but got no significant fluid to send off.  No further recs from my standpoint.

## 2013-08-07 NOTE — Progress Notes (Signed)
Medicated for nausea, no vomiting noted.  Bandaid to left knee dry and intact(site of aspiration done by MD this am).

## 2013-08-07 NOTE — Progress Notes (Signed)
OT Cancellation Note  Patient Details Name: [name] MRN: [id number] DOB: 1951/12/07   Cancelled Treatment:    Reason Eval/Treat Not Completed: Other (comment)In to follow up on changing out wide sock aide for standard sock after giving pt overnight to think about whether she was going to get one or not. Pt stated that she thinks she has a standard size sock aide at home. I let her know that if she did and it did not work due to her feet being swollen that if she wanted to buy a new standard one in the gift shop we would change it out for her for free if she came to our department on the ground floor. She verbalized understanding.  Evette Georges CL 08/07/2013, 5:31 PM

## 2013-08-08 DIAGNOSIS — Z7901 Long term (current) use of anticoagulants: Secondary | ICD-10-CM

## 2013-08-08 LAB — PROTIME-INR: INR: 3.16 — ABNORMAL HIGH (ref 0.00–1.49)

## 2013-08-08 LAB — GLUCOSE, CAPILLARY: Glucose-Capillary: 89 mg/dL (ref 70–99)

## 2013-08-08 MED ORDER — ONDANSETRON HCL 4 MG PO TABS: 4.0000 mg | ORAL_TABLET | Freq: Four times a day (QID) | ORAL | Status: DC | PRN

## 2013-08-08 MED ORDER — OXYCODONE-ACETAMINOPHEN 5-325 MG PO TABS: 1.0000 | ORAL_TABLET | Freq: Three times a day (TID) | ORAL | Status: DC | PRN

## 2013-08-08 MED ORDER — METOPROLOL TARTRATE 25 MG PO TABS: 25.0000 mg | ORAL_TABLET | Freq: Two times a day (BID) | ORAL | Status: DC

## 2013-08-08 NOTE — Progress Notes (Signed)
Client did not want Percocet once assisted to chair.  Tolerating meal without incident of vomiting.  Awaiting someone to come and get her, will give discharge papers then.

## 2013-08-08 NOTE — Progress Notes (Signed)
   CARE MANAGEMENT NOTE 08/08/2013  Patient:  [name], [name]   Account Number:  [id number]  Date Initiated:  08/05/2013  Documentation initiated by:  Tera Mater  Subjective/Objective Assessment:   62yo female admitted with PNA.  Pt. lives at home with spouse.     Action/Plan:   discharge planning   Anticipated DC Date:  08/07/2013   Anticipated DC Plan:  HOME W HOME HEALTH SERVICES      DC Planning Services  CM consult      [facility] Choice  HOME HEALTH   Choice offered to / List presented to:  C-1 Patient        HH arranged  HH-2 PT  HH-1 RN  HH-3 OT      HH agency  [facility]   Status of service:  Completed, signed off Medicare Important Message given?   (If response is "NO", the following Medicare IM given date fields will be blank) Date Medicare IM given:   Date Additional Medicare IM given:    Discharge Disposition:  HOME W HOME HEALTH SERVICES  Per UR Regulation:  Reviewed for med. necessity/level of care/duration of stay  If discussed at Long Length of Stay Meetings, dates discussed:    Comments:  08/08/13 13:45 Spoke with [facility] rep to inform of pt's discharge.  No other CM needs communicated. Freddy Jaksch, BSN, [place] [phone]  08/06/13  1510  [address] RN,BSN, NCM [phone] Home OT recommended per OT assessment.  [facility] notified to add OT services to [facility] services.  8/'28/14 1145 In to speak with pt. about home health needs. Pt. stated she has had [facility] home health in past, and was satisfied with their services.  TC to [facility], with [facility], to give referral for [facility] RN/PT.  Pt. currently on IV lasix, and will be INPT for at least 1-2 more days. Tera Mater, RN, BSN NCM [phone]

## 2013-08-08 NOTE — Discharge Summary (Signed)
Physician Discharge Summary  [name] WUJ:[id number] DOB: August 29, 1951 DOA: 08/03/2013  PCP: Willey Blade, MD  Admit date: 08/03/2013 Discharge date: 08/08/2013  Time spent: 45 minutes  Recommendations for Outpatient Follow-up:  1. Please followup on PT/INR, patient on chronic anticoagulation for paroxysmal 8 for ablation. Her INRs on the last 4 days have been 3.2, 3.06, 3.11, 3.16, her Coumadin had been held throughout this time. I instructed her to have her INR checked on Tuesday morning 2. Followup on basic metabolic panel early next week. Patient having acute on chronic renal failure likely secondary to IV diuresis during this hospitalization.  Discharge Diagnoses:  Active Problems:   HYPERLIPIDEMIA   OBSTRUCTIVE SLEEP APNEA   HYPERTENSION   Obesity hypoventilation syndrome   Prosthetic joint infection   Gout   DM (diabetes mellitus)   Chronic anticoagulation   Chronic venous insufficiency   CKD (chronic kidney disease), stage IV   Anemia of chronic disease   PAF (paroxysmal atrial fibrillation)   Psoriasis   Morbid obesity   HCAP (healthcare-associated pneumonia)   Systolic and diastolic CHF, acute on chronic   Discharge Condition: Stable/improved  Diet recommendation: Heart healthy diet  Filed Weights   08/06/13 0431 08/07/13 0300 08/08/13 0500  Weight: 124.603 kg (274 lb 11.2 oz) 120.7 kg (266 lb 1.5 oz) 120.1 kg (264 lb 12.4 oz)    History of present illness:  HPI: [name] is a 62 y.o. 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  4 days ago patient noted difficulty weightbearing on left knee secondary to pain. She normally ambulates with the help of a walker. She denies history of trauma. Her legs are chronically swollen left greater than right and she has not noticed any change. No redness. Denies fever or chills. She also complains of worsening dyspnea and dry cough. No chest pain. Seems to have chronic dyspnea/orthopnea of 2 pillows. Claims compliance to medications and cannot truly safe she has gained weight. She also complains of extensive rash underneath her breasts, groin, external genitalia and apparently sees him OP dermatologist for psoriasis. In the ED, creatinine 1.9 (baseline probably 2.5), Pro BNP 1789, troponin negative, hemoglobin 7.5 (baseline), chest x-ray-cardiomegaly, vascular congestion and right infrahilar atelectasis or infiltrate and x-ray of left knee without complicating features. Hospitalist admission requested for possible HCAP   Hospital Course:  [name] is a pleasant 62 year old female with multiple comorbidities including chronic infection of left knee prosthesis, status post two-stage joint replacements with history of MRSA from the wound in 2007, group B streptococcal infection in June of 2012 which was resistant to clindamycin. Joint aspiration has revealed the presence of crystals as well.. She had been following Dr. Daiva Eves infectious disease in the outpatient setting, taking Levaquin 250 mg by mouth daily as suppression therapy. She had been referred to [facility] for a second opinion on her knee. It appears that a two-stage revision and the possibility of amputation was discussed with patient. Patient expressed concerns about undergoing amputation and did not followup. She presented to the emergency department on 08/03/13 with complaints worsening left knee pain as well as increasing dyspnea. She denied recent  falls or trauma to her knee. Initial lab work showed a BNP of 1789. Chest  x-ray revealed the presence of cardiomegaly with vascular congestion there is concerns about possible healthcare associated pneumonia as well as patient was started on broad-spectrum empiric antibiotic therapy with IV vancomycin and aztreonam. Her Levaquin was held. She remained hemodynamically stable, afebrile and nontoxic appearing. His main symptoms seem to be more consistent with acute decompensated heart failure rather than underlying infectious process within lungs. Dr. Daiva Eves of infectious disease was consulted he recommended the discontinuation of all antibiotics, obtain an orthopedic consult. He recommended that a joint aspiration be performed at least 48 hours after the discontinuation of antibiotic therapy to increase yield. Dr. Anastasio Auerbach of orthopedic surgery was consulted. He felt that in all likelihood components are chronically loose within her knee causing significant pain. Other options could be excision arthroplasty and possibly two-stage revision versus amputation. It was not felt that from a medical standpoint she would be able to tolerate such procedure at this time. This would need to be reassessed as an outpatient. On 08/07/2013 a joint aspiration was attempted however only minimal amount of fluid could be drawn. There was an insufficient amount to send for cultures. I discussed this with infectious disease who agreed with restarting suppression therapy with Levaquin and 4 patient followup in the office. Other issues during this hospitalization include acute on chronic anemia as hemoglobin trended to 7.1. Given patient was symptomatic, she was transfused with 2 units of packed red blood cells. Additionally she had acute on chronic renal failure likely secondary to IV diuresis. By 08/07/2013 her creatinine improved from 2.7-2.56. She was transitioned to oral diuretic therapy. Patient's INR remains supratherapeutic at 3.16 on 08/08/2013. I instructed her to hold off on warfarin, have  a PT/INR checked on Tuesday morning. She had been supratherapeutic since 08/04/2013. Patient was discharged in stable condition on 08/08/2013.  Procedures:  Bedside joint aspiration, minimal amount of fluid removed.  Consultations:  Infectious disease: Dr. Zenaida Niece  Orthopedic surgery: Dr. Magnus Ivan  Discharge Exam: Filed Vitals:   08/08/13 0500  BP: 121/70  Pulse: 66  Temp: 97.7 F (36.5 C)  Resp: 18    General: Patient is in no acute distress she is sitting at bedside chair awake alert oriented Cardiovascular: Regular rate and rhythm normal S1-S2 no murmurs rubs or gallops Respiratory: Patient having a few bibasal crackles otherwise normal respiratory effort on supplemental oxygen Abdomen: Soft nontender nontender positive bowel sounds in upper quadrant Extremities: Patient having 2-3+ bilateral extremity pitting edema, unchanged over the last several days positive venous stasis changes to lower extremity, her knee did not appear erythematous with improvement in pain.  Discharge Instructions  Discharge Orders   Future Appointments Provider Department Dept Phone   10/04/2013 10:00 AM Randall Hiss, MD [facility] [phone]   Future Orders Complete By Expires   Call MD for:  difficulty breathing, headache or visual disturbances  As directed    Call MD for:  persistant nausea and vomiting  As directed    Call MD for:  redness, tenderness, or signs of infection (pain, swelling, redness, odor or green/yellow discharge around incision site)  As directed    Call MD for:  severe uncontrolled pain  As directed    Call MD for:  temperature >100.4  As directed    Diet - low sodium heart healthy  As directed    Discharge instructions  As directed  Comments:     Your INR level today was 3.16, I would recommend that you hold your coumadin for today and tomorrow, present to your doctor's office on Tues morning for a repeat PT/INR check, prior to  restarting coumadin.   Face-to-face encounter (required for Medicare/Medicaid patients)  As directed    Comments:     I Lorain Keast certify that this patient is under my care and that I, or a nurse practitioner or physician's assistant working with me, had a face-to-face encounter that meets the physician face-to-face encounter requirements with this patient on 08/08/2013. The encounter with the patient was in whole, or in part for the following medical condition(s) which is the primary reason for home health care (List medical condition): Patient with chronic infection of prosthesis of right knee, having significant deconditioning, high falls risk.   Questions:     The encounter with the patient was in whole, or in part, for the following medical condition, which is the primary reason for home health care:  Patient with significant deconditioning with chronic infected knee prosthesis   I certify that, based on my findings, the following services are medically necessary home health services:  Physical therapy   Nursing   My clinical findings support the need for the above services:  Can transfer bed to chair only   Further, I certify that my clinical findings support that this patient is homebound due to:  Unsafe ambulation due to balance issues   Reason for Medically Necessary Home Health Services:  Therapy- Therapeutic Exercises to Increase Strength and Endurance   Therapy- Investment banker, operational, Patent examiner   Home Health  As directed    Questions:     To provide the following care/treatments:  Home Health Aide   Increase activity slowly  As directed        Medication List    STOP taking these medications       warfarin 5 MG tablet  Commonly known as:  COUMADIN      TAKE these medications       albuterol 108 (90 BASE) MCG/ACT inhaler  Commonly known as:  PROVENTIL HFA;VENTOLIN HFA  Inhale 2 puffs into the lungs every 6 (six) hours as needed for wheezing or  shortness of breath.     allopurinol 100 MG tablet  Commonly known as:  ZYLOPRIM  Take 100 mg by mouth daily.     amiodarone 200 MG tablet  Commonly known as:  PACERONE  Take 200 mg by mouth daily.     amLODipine 10 MG tablet  Commonly known as:  NORVASC  Take 10 mg by mouth daily.     atorvastatin 20 MG tablet  Commonly known as:  LIPITOR  Take 20 mg by mouth at bedtime.     ferrous sulfate 325 (65 FE) MG tablet  Take 325 mg by mouth 3 (three) times daily with meals.     furosemide 40 MG tablet  Commonly known as:  LASIX  Take 40 mg by mouth 2 (two) times daily.     hydrALAZINE 50 MG tablet  Commonly known as:  APRESOLINE  Take 50 mg by mouth 3 (three) times daily.     insulin aspart 100 UNIT/ML injection  Commonly known as:  novoLOG  Inject 0-20 Units into the skin 3 (three) times daily before meals. Per sliding scale     insulin glargine 100 UNIT/ML injection  Commonly known as:  LANTUS  Inject 20 Units into the  skin 2 (two) times daily.     levofloxacin 250 MG tablet  Commonly known as:  LEVAQUIN  Take 250 mg by mouth daily.     metoprolol tartrate 25 MG tablet  Commonly known as:  LOPRESSOR  Take 1 tablet (25 mg total) by mouth 2 (two) times daily.     ondansetron 4 MG tablet  Commonly known as:  ZOFRAN  Take 1 tablet (4 mg total) by mouth every 6 (six) hours as needed for nausea.     oxyCODONE-acetaminophen 5-325 MG per tablet  Commonly known as:  PERCOCET/ROXICET  Take 1 tablet by mouth every 8 (eight) hours as needed.     pantoprazole 40 MG tablet  Commonly known as:  PROTONIX  Take 40 mg by mouth daily.     spironolactone 25 MG tablet  Commonly known as:  ALDACTONE  Take 25 mg by mouth 2 (two) times daily.     zolpidem 5 MG tablet  Commonly known as:  AMBIEN  Take 5 mg by mouth at bedtime as needed for sleep.       Allergies  Allergen Reactions  . Daptomycin Other (See Comments)    Elevated CPK  . Morphine And Related Nausea And Vomiting   . Cephalexin Rash    unknown  . Celecoxib     unknown  . Codeine     unknown  . Fluoxetine Hcl     unknown  . Latex     REACTION: undefined  . Ofloxacin     unknown  . Penicillins     unknown  . Rofecoxib     unknown  . Sulfonamide Derivatives     unknown       Follow-up Information   Follow up with [facility]. (home health nurse and physical therapy)    Contact information:   [phone]       Please follow up.   Contact information:   [facility] [address] [place] [address] [phone]       Follow up with Acey Lav, MD In 1 week.   Specialty:  Infectious Diseases   Contact information:   [address] [place] [address] [phone]        The results of significant diagnostics from this hospitalization (including imaging, microbiology, ancillary and laboratory) are listed below for reference.    Significant Diagnostic Studies: Dg Chest 1 View  08/03/2013   CLINICAL DATA:  shortness of breath, weakness. Left knee swelling.  EXAM: CHEST - 1 VIEW  COMPARISON:  07/05/2013  FINDINGS: Mild cardiomegaly with vascular congestion. Mild right infrahilar airspace opacity, atelectasis versus infiltrate. No confluent opacity on the left. No visible effusions. No overt edema or acute bony abnormality.  IMPRESSION: Cardiomegaly, vascular congestion.  Right infrahilar atelectasis or infiltrate.   Electronically Signed   By: Charlett Nose   On: 08/03/2013 13:14   Dg Knee Complete 4 Views Left  08/03/2013   CLINICAL DATA:  Shortness of breath, weakness.  EXAM: LEFT KNEE - COMPLETE 4+ VIEW  COMPARISON:  06/09/2012  FINDINGS: Changes of left knee replacement. No joint effusion. No acute fracture, subluxation or dislocation.  IMPRESSION: Left knee replacement. No complicating feature.   Electronically Signed   By: Charlett Nose   On: 08/03/2013 13:16    Microbiology: Recent Results (from the past 240  hour(s))  MRSA PCR SCREENING     Status: Abnormal   Collection Time  08/03/13  4:56 PM      Result Value Range Status   MRSA by PCR POSITIVE (*) NEGATIVE Final   Comment:            The GeneXpert MRSA Assay (FDA     approved for NASAL specimens     only), is one component of a     comprehensive MRSA colonization     surveillance program. It is not     intended to diagnose MRSA     infection nor to guide or     monitor treatment for     MRSA infections.     RESULT CALLED TO, READ BACK BY AND VERIFIED WITH:     DBarry Dienes RN 18:45 08/03/13 ([email or web address])     Labs: Basic Metabolic Panel:  Recent Labs Lab 08/03/13 1122 08/04/13 0713 08/05/13 0415 08/06/13 0610 08/07/13 0500  NA 142 141 140 140 140  K 4.8 4.8 5.0 4.8 4.6  CL 108 107 105 103 104  CO2 25 26 26 24 26   GLUCOSE 94 102* 64* 66* 71  BUN 33* 31* 34* 38* 43*  CREATININE 1.92* 2.01* 2.36* 2.70* 2.56*  CALCIUM 9.0 8.5 8.7 8.7 8.7   Liver Function Tests: No results found for this basename: AST, ALT, ALKPHOS, BILITOT, PROT, ALBUMIN,  in the last 168 hours No results found for this basename: LIPASE, AMYLASE,  in the last 168 hours No results found for this basename: AMMONIA,  in the last 168 hours CBC:  Recent Labs Lab 08/03/13 1122 08/04/13 0713 08/04/13 2230 08/05/13 0415 08/06/13 0610 08/07/13 0500  WBC 8.6 8.1 9.4 8.5 7.9 7.1  NEUTROABS 7.5  --   --   --   --   --   HGB 7.5* 7.1* 8.7* 8.7* 9.2* 9.0*  HCT 25.0* 24.2* 28.3* 28.7* 30.1* 30.1*  MCV 71.8* 72.9* 75.1* 75.1* 75.8* 77.0*  PLT 346 312 312 317 347 324   Cardiac Enzymes:  Recent Labs Lab 08/03/13 1122  TROPONINI <0.30   BNP: BNP (last 3 results)  Recent Labs  02/15/13 0434 07/05/13 1536 08/03/13 1122  PROBNP 438.6* 1581.0* 1789.0*   CBG:  Recent Labs Lab 08/07/13 0549 08/07/13 1132 08/07/13 1617 08/07/13 2130 08/08/13 0607  GLUCAP 74 107* 118* 119* 108*       Signed:  Haruto Demaria  [facility] 08/08/2013,  10:01 AM

## 2013-08-08 NOTE — Progress Notes (Signed)
Pt resting on bed comfortable no distress noticed.

## 2013-08-08 NOTE — Progress Notes (Signed)
Verbalized understanding of discharge instructions.  Respiratory exchange even and unlabored.  Skin is fragile from Psoriasis which is an ongoing chronic issue.  IV removed.  Telemetry box removed and telemetry unit notified by Unit Secretary.

## 2013-08-09 NOTE — Progress Notes (Signed)
Received call from secretary on 4700/4E that this patient had called and needed a call back as she had discharged home yesterday.    Called patient at home and she stated that she had spoken with CM yesterday prior to discharge about a hospital bed.  She stated that initially she didn't think she needed it but now that she is home, she feels that she does need it.  I checked the order history and it was not ordered before pt discharged. Due to the holiday (Labor Day), pt's PCP office, Dr. Willey Blade, will be closed.  I advised patient to call his office in the morning and explain what she needed.  Told her that until then, if she was able to use pillows in her current bed to prevent laying totally flat, that it may help with her breathing. Pt stated understanding and that she would call PCP office in the morning.

## 2013-08-10 ENCOUNTER — Inpatient Hospital Stay (HOSPITAL_COMMUNITY)
Admission: EM | Admit: 2013-08-10 | Discharge: 2013-08-19 | DRG: 377 | Disposition: A | Discharge status: Home / Self Care | Payer: Medicare HMO | Attending: Internal Medicine | Admitting: Internal Medicine

## 2013-08-10 ENCOUNTER — Encounter (HOSPITAL_COMMUNITY): Payer: Self-pay | Admitting: Neurology

## 2013-08-10 ENCOUNTER — Inpatient Hospital Stay (HOSPITAL_COMMUNITY): Payer: Medicare HMO

## 2013-08-10 DIAGNOSIS — L408 Other psoriasis: Secondary | ICD-10-CM | POA: Diagnosis present

## 2013-08-10 DIAGNOSIS — E662 Morbid (severe) obesity with alveolar hypoventilation: Secondary | ICD-10-CM

## 2013-08-10 DIAGNOSIS — Z8673 Personal history of transient ischemic attack (TIA), and cerebral infarction without residual deficits: Secondary | ICD-10-CM

## 2013-08-10 DIAGNOSIS — E119 Type 2 diabetes mellitus without complications: Secondary | ICD-10-CM

## 2013-08-10 DIAGNOSIS — B373 Candidiasis of vulva and vagina: Secondary | ICD-10-CM

## 2013-08-10 DIAGNOSIS — D638 Anemia in other chronic diseases classified elsewhere: Secondary | ICD-10-CM

## 2013-08-10 DIAGNOSIS — J9600 Acute respiratory failure, unspecified whether with hypoxia or hypercapnia: Secondary | ICD-10-CM

## 2013-08-10 DIAGNOSIS — I5042 Chronic combined systolic (congestive) and diastolic (congestive) heart failure: Secondary | ICD-10-CM

## 2013-08-10 DIAGNOSIS — L409 Psoriasis, unspecified: Secondary | ICD-10-CM

## 2013-08-10 DIAGNOSIS — E875 Hyperkalemia: Secondary | ICD-10-CM

## 2013-08-10 DIAGNOSIS — I10 Essential (primary) hypertension: Secondary | ICD-10-CM

## 2013-08-10 DIAGNOSIS — E1129 Type 2 diabetes mellitus with other diabetic kidney complication: Secondary | ICD-10-CM | POA: Diagnosis present

## 2013-08-10 DIAGNOSIS — I5041 Acute combined systolic (congestive) and diastolic (congestive) heart failure: Secondary | ICD-10-CM

## 2013-08-10 DIAGNOSIS — K5731 Diverticulosis of large intestine without perforation or abscess with bleeding: Principal | ICD-10-CM | POA: Diagnosis present

## 2013-08-10 DIAGNOSIS — G4733 Obstructive sleep apnea (adult) (pediatric): Secondary | ICD-10-CM

## 2013-08-10 DIAGNOSIS — I5043 Acute on chronic combined systolic (congestive) and diastolic (congestive) heart failure: Secondary | ICD-10-CM

## 2013-08-10 DIAGNOSIS — I509 Heart failure, unspecified: Secondary | ICD-10-CM | POA: Diagnosis present

## 2013-08-10 DIAGNOSIS — B3731 Acute candidiasis of vulva and vagina: Secondary | ICD-10-CM

## 2013-08-10 DIAGNOSIS — Z22322 Carrier or suspected carrier of Methicillin resistant Staphylococcus aureus: Secondary | ICD-10-CM

## 2013-08-10 DIAGNOSIS — Z6841 Body Mass Index (BMI) 40.0 and over, adult: Secondary | ICD-10-CM

## 2013-08-10 DIAGNOSIS — A491 Streptococcal infection, unspecified site: Secondary | ICD-10-CM

## 2013-08-10 DIAGNOSIS — K56609 Unspecified intestinal obstruction, unspecified as to partial versus complete obstruction: Secondary | ICD-10-CM

## 2013-08-10 DIAGNOSIS — K649 Unspecified hemorrhoids: Secondary | ICD-10-CM | POA: Diagnosis present

## 2013-08-10 DIAGNOSIS — I480 Paroxysmal atrial fibrillation: Secondary | ICD-10-CM

## 2013-08-10 DIAGNOSIS — J9610 Chronic respiratory failure, unspecified whether with hypoxia or hypercapnia: Secondary | ICD-10-CM | POA: Diagnosis present

## 2013-08-10 DIAGNOSIS — J45901 Unspecified asthma with (acute) exacerbation: Secondary | ICD-10-CM

## 2013-08-10 DIAGNOSIS — K219 Gastro-esophageal reflux disease without esophagitis: Secondary | ICD-10-CM | POA: Diagnosis present

## 2013-08-10 DIAGNOSIS — Z792 Long term (current) use of antibiotics: Secondary | ICD-10-CM

## 2013-08-10 DIAGNOSIS — M109 Gout, unspecified: Secondary | ICD-10-CM

## 2013-08-10 DIAGNOSIS — J189 Pneumonia, unspecified organism: Secondary | ICD-10-CM

## 2013-08-10 DIAGNOSIS — N189 Chronic kidney disease, unspecified: Secondary | ICD-10-CM

## 2013-08-10 DIAGNOSIS — N184 Chronic kidney disease, stage 4 (severe): Secondary | ICD-10-CM

## 2013-08-10 DIAGNOSIS — Z91199 Patient's noncompliance with other medical treatment and regimen due to unspecified reason: Secondary | ICD-10-CM

## 2013-08-10 DIAGNOSIS — D371 Neoplasm of uncertain behavior of stomach: Secondary | ICD-10-CM | POA: Diagnosis present

## 2013-08-10 DIAGNOSIS — I872 Venous insufficiency (chronic) (peripheral): Secondary | ICD-10-CM

## 2013-08-10 DIAGNOSIS — R5381 Other malaise: Secondary | ICD-10-CM | POA: Diagnosis present

## 2013-08-10 DIAGNOSIS — B370 Candidal stomatitis: Secondary | ICD-10-CM

## 2013-08-10 DIAGNOSIS — I5031 Acute diastolic (congestive) heart failure: Secondary | ICD-10-CM

## 2013-08-10 DIAGNOSIS — D62 Acute posthemorrhagic anemia: Secondary | ICD-10-CM | POA: Diagnosis not present

## 2013-08-10 DIAGNOSIS — Z9981 Dependence on supplemental oxygen: Secondary | ICD-10-CM

## 2013-08-10 DIAGNOSIS — I160 Hypertensive urgency: Secondary | ICD-10-CM

## 2013-08-10 DIAGNOSIS — Z7901 Long term (current) use of anticoagulants: Secondary | ICD-10-CM

## 2013-08-10 DIAGNOSIS — I129 Hypertensive chronic kidney disease with stage 1 through stage 4 chronic kidney disease, or unspecified chronic kidney disease: Secondary | ICD-10-CM | POA: Diagnosis present

## 2013-08-10 DIAGNOSIS — Z87891 Personal history of nicotine dependence: Secondary | ICD-10-CM

## 2013-08-10 DIAGNOSIS — K5730 Diverticulosis of large intestine without perforation or abscess without bleeding: Secondary | ICD-10-CM | POA: Diagnosis present

## 2013-08-10 DIAGNOSIS — Z96659 Presence of unspecified artificial knee joint: Secondary | ICD-10-CM

## 2013-08-10 DIAGNOSIS — F329 Major depressive disorder, single episode, unspecified: Secondary | ICD-10-CM | POA: Diagnosis present

## 2013-08-10 DIAGNOSIS — I6789 Other cerebrovascular disease: Secondary | ICD-10-CM

## 2013-08-10 DIAGNOSIS — J42 Unspecified chronic bronchitis: Secondary | ICD-10-CM

## 2013-08-10 DIAGNOSIS — M1990 Unspecified osteoarthritis, unspecified site: Secondary | ICD-10-CM | POA: Diagnosis present

## 2013-08-10 DIAGNOSIS — K625 Hemorrhage of anus and rectum: Secondary | ICD-10-CM

## 2013-08-10 DIAGNOSIS — Z794 Long term (current) use of insulin: Secondary | ICD-10-CM

## 2013-08-10 DIAGNOSIS — E785 Hyperlipidemia, unspecified: Secondary | ICD-10-CM

## 2013-08-10 DIAGNOSIS — T8450XA Infection and inflammatory reaction due to unspecified internal joint prosthesis, initial encounter: Secondary | ICD-10-CM

## 2013-08-10 DIAGNOSIS — N179 Acute kidney failure, unspecified: Secondary | ICD-10-CM | POA: Diagnosis present

## 2013-08-10 DIAGNOSIS — R233 Spontaneous ecchymoses: Secondary | ICD-10-CM

## 2013-08-10 DIAGNOSIS — F3289 Other specified depressive episodes: Secondary | ICD-10-CM | POA: Diagnosis present

## 2013-08-10 DIAGNOSIS — J45909 Unspecified asthma, uncomplicated: Secondary | ICD-10-CM

## 2013-08-10 DIAGNOSIS — I4891 Unspecified atrial fibrillation: Secondary | ICD-10-CM

## 2013-08-10 DIAGNOSIS — K922 Gastrointestinal hemorrhage, unspecified: Secondary | ICD-10-CM

## 2013-08-10 DIAGNOSIS — Z9119 Patient's noncompliance with other medical treatment and regimen: Secondary | ICD-10-CM

## 2013-08-10 DIAGNOSIS — R791 Abnormal coagulation profile: Secondary | ICD-10-CM | POA: Diagnosis present

## 2013-08-10 HISTORY — DX: Dependence on supplemental oxygen: Z99.81

## 2013-08-10 HISTORY — DX: Unspecified chronic bronchitis: J42

## 2013-08-10 HISTORY — DX: Type 2 diabetes mellitus without complications: E11.9

## 2013-08-10 HISTORY — DX: Hemorrhage, not elsewhere classified: R58

## 2013-08-10 HISTORY — DX: Personal history of other medical treatment: Z92.89

## 2013-08-10 HISTORY — DX: Shortness of breath: R06.02

## 2013-08-10 LAB — BASIC METABOLIC PANEL
BUN: 59 mg/dL — ABNORMAL HIGH (ref 6–23)
Chloride: 102 mEq/L (ref 96–112)
GFR calc Af Amer: 19 mL/min — ABNORMAL LOW (ref >90)
Potassium: 5.6 mEq/L — ABNORMAL HIGH (ref 3.5–5.1)

## 2013-08-10 LAB — CBC
HCT: 30.6 % — ABNORMAL LOW (ref 36.0–46.0)
Platelets: 311 10*3/uL (ref 150–400)
RBC: 3.99 MIL/uL (ref 3.87–5.11)
RDW: 21.7 % — ABNORMAL HIGH (ref 11.5–15.5)
WBC: 7.5 10*3/uL (ref 4.0–10.5)

## 2013-08-10 LAB — POCT I-STAT, CHEM 8
Chloride: 108 mEq/L (ref 96–112)
Creatinine, Ser: 3.2 mg/dL — ABNORMAL HIGH (ref 0.50–1.10)
Glucose, Bld: 63 mg/dL — ABNORMAL LOW (ref 70–99)
Potassium: 5.3 mEq/L — ABNORMAL HIGH (ref 3.5–5.1)

## 2013-08-10 LAB — APTT: aPTT: 51 seconds — ABNORMAL HIGH (ref 24–37)

## 2013-08-10 LAB — GLUCOSE, CAPILLARY
Glucose-Capillary: 59 mg/dL — ABNORMAL LOW (ref 70–99)
Glucose-Capillary: 93 mg/dL (ref 70–99)
Glucose-Capillary: 100 mg/dL — ABNORMAL HIGH (ref 70–99)

## 2013-08-10 LAB — MRSA PCR SCREENING: MRSA by PCR: NEGATIVE

## 2013-08-10 LAB — PROTIME-INR: Prothrombin Time: 28.3 seconds — ABNORMAL HIGH (ref 11.6–15.2)

## 2013-08-10 MED ORDER — ZOLPIDEM TARTRATE 5 MG PO TABS: 5.0000 mg | ORAL_TABLET | Freq: Every evening | ORAL | Status: DC | PRN

## 2013-08-10 MED ORDER — AMIODARONE HCL 200 MG PO TABS
200.0000 mg | ORAL_TABLET | Freq: Every day | ORAL | Status: DC
  Administered 2013-08-10 – 2013-08-19 (×9): 200 mg via ORAL
  Filled 2013-08-10 (×10): qty 1

## 2013-08-10 MED ORDER — AMLODIPINE BESYLATE 10 MG PO TABS
10.0000 mg | ORAL_TABLET | Freq: Every day | ORAL | Status: DC
  Administered 2013-08-10 – 2013-08-19 (×10): 10 mg via ORAL
  Filled 2013-08-10 (×10): qty 1

## 2013-08-10 MED ORDER — "THROMBI-PAD 3""X3"" EX PADS"
3.0000 | MEDICATED_PAD | Freq: Once | CUTANEOUS | Status: AC
  Administered 2013-08-10: 3 via TOPICAL
  Filled 2013-08-10: qty 3

## 2013-08-10 MED ORDER — INSULIN ASPART 100 UNIT/ML ~~LOC~~ SOLN
0.0000 [IU] | Freq: Three times a day (TID) | SUBCUTANEOUS | Status: DC
  Administered 2013-08-12: 3 [IU] via SUBCUTANEOUS
  Administered 2013-08-17 (×2): 2 [IU] via SUBCUTANEOUS

## 2013-08-10 MED ORDER — HYDROMORPHONE HCL PF 1 MG/ML IJ SOLN: 1.0000 mg | INTRAMUSCULAR | Status: AC | PRN

## 2013-08-10 MED ORDER — INSULIN GLARGINE 100 UNIT/ML ~~LOC~~ SOLN
20.0000 [IU] | Freq: Two times a day (BID) | SUBCUTANEOUS | Status: DC
  Administered 2013-08-10 – 2013-08-11 (×2): 20 [IU] via SUBCUTANEOUS
  Filled 2013-08-10 (×5): qty 0.2

## 2013-08-10 MED ORDER — SODIUM POLYSTYRENE SULFONATE 15 GM/60ML PO SUSP
15.0000 g | Freq: Once | ORAL | Status: AC
  Administered 2013-08-10: 15 g via ORAL
  Filled 2013-08-10: qty 60

## 2013-08-10 MED ORDER — DOCUSATE SODIUM 100 MG PO CAPS
100.0000 mg | ORAL_CAPSULE | Freq: Two times a day (BID) | ORAL | Status: DC
  Administered 2013-08-10 – 2013-08-19 (×12): 100 mg via ORAL
  Filled 2013-08-10 (×21): qty 1

## 2013-08-10 MED ORDER — METOPROLOL TARTRATE 25 MG PO TABS
25.0000 mg | ORAL_TABLET | Freq: Two times a day (BID) | ORAL | Status: DC
  Administered 2013-08-10 – 2013-08-19 (×13): 25 mg via ORAL
  Filled 2013-08-10 (×19): qty 1

## 2013-08-10 MED ORDER — ALBUTEROL SULFATE HFA 108 (90 BASE) MCG/ACT IN AERS
2.0000 | INHALATION_SPRAY | Freq: Four times a day (QID) | RESPIRATORY_TRACT | Status: DC | PRN
  Filled 2013-08-10: qty 6.7

## 2013-08-10 MED ORDER — SODIUM CHLORIDE 0.9 % IJ SOLN
3.0000 mL | Freq: Two times a day (BID) | INTRAMUSCULAR | Status: DC
  Administered 2013-08-11 – 2013-08-18 (×12): 3 mL via INTRAVENOUS

## 2013-08-10 MED ORDER — HYDRALAZINE HCL 50 MG PO TABS
50.0000 mg | ORAL_TABLET | Freq: Three times a day (TID) | ORAL | Status: DC
  Administered 2013-08-10 – 2013-08-19 (×26): 50 mg via ORAL
  Filled 2013-08-10 (×31): qty 1

## 2013-08-10 MED ORDER — ACETAMINOPHEN 650 MG RE SUPP: 650.0000 mg | Freq: Four times a day (QID) | RECTAL | Status: DC | PRN

## 2013-08-10 MED ORDER — ALLOPURINOL 100 MG PO TABS
100.0000 mg | ORAL_TABLET | Freq: Every day | ORAL | Status: DC
  Administered 2013-08-10 – 2013-08-19 (×10): 100 mg via ORAL
  Filled 2013-08-10 (×10): qty 1

## 2013-08-10 MED ORDER — PANTOPRAZOLE SODIUM 40 MG PO TBEC
40.0000 mg | DELAYED_RELEASE_TABLET | Freq: Every day | ORAL | Status: DC
  Administered 2013-08-10 – 2013-08-19 (×10): 40 mg via ORAL
  Filled 2013-08-10 (×9): qty 1

## 2013-08-10 MED ORDER — ONDANSETRON HCL 4 MG/2ML IJ SOLN: 4.0000 mg | Freq: Three times a day (TID) | INTRAMUSCULAR | Status: AC | PRN

## 2013-08-10 MED ORDER — LEVOFLOXACIN 250 MG PO TABS
250.0000 mg | ORAL_TABLET | Freq: Every day | ORAL | Status: DC
  Administered 2013-08-10 – 2013-08-19 (×10): 250 mg via ORAL
  Filled 2013-08-10 (×10): qty 1

## 2013-08-10 MED ORDER — ATORVASTATIN CALCIUM 20 MG PO TABS
20.0000 mg | ORAL_TABLET | Freq: Every day | ORAL | Status: DC
  Administered 2013-08-10 – 2013-08-18 (×9): 20 mg via ORAL
  Filled 2013-08-10 (×10): qty 1

## 2013-08-10 MED ORDER — OXYCODONE-ACETAMINOPHEN 5-325 MG PO TABS
1.0000 | ORAL_TABLET | Freq: Three times a day (TID) | ORAL | Status: DC | PRN
  Administered 2013-08-11 – 2013-08-12 (×3): 1 via ORAL
  Filled 2013-08-10 (×3): qty 1

## 2013-08-10 MED ORDER — ACETAMINOPHEN 325 MG PO TABS
650.0000 mg | ORAL_TABLET | Freq: Four times a day (QID) | ORAL | Status: DC | PRN
  Administered 2013-08-14 – 2013-08-18 (×5): 650 mg via ORAL
  Filled 2013-08-10 (×5): qty 2

## 2013-08-10 NOTE — H&P (Signed)
[facility] History and Physical  [name] ZOX:[id number] DOB: 1951-10-06 DOA: 08/10/2013  Referring physician: ED PCP: Willey Blade, MD   Chief Complaint:  Oozing of blood from the skin  HPI:  62 y/o morbidly obese female with history of chronic systolic and diastolic CHF, diabetes mellitus type 2, hypertension, hyperlipidemia, paroxysmal A. 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 She reports some itching over the LLQ after which she started bleeding from the skin.  During her recent hospitalization her INR was supratherapeutic and patient upon discharge had her Coumadin held which she hasn't resumed yet. She is unaware of any rectal or vaginal bleed but noticed that her undergarments were stained. She denies any headache, dizziness, blurred vision, chest pain, palpitations, worsening shortness of breath. Denies fever, chills, abdominal pain, nausea, vomiting, denies any vaginal bleed.   course in the ED Patient's vitals were stable. A pelvic exam done by ED physician showed  no bleeding. Rectal exam done revealed bright red blood and guaiac positive. Hb appeared at baseline. BMET showed worsened creatinine with k of 5.6. INR was 2.77.[facility] called for admission.   Review of Systems:  Constitutional: Denies fever, chills, diaphoresis, appetite change and fatigue.  HEENT: Denies photophobia, eye pain, redness, hearing loss, ear pain, congestion, sore throat, rhinorrhea, sneezing, mouth sores, trouble swallowing, neck pain, neck stiffness and tinnitus.   Respiratory:  chronic  SOB, DOE, denies cough, chest tightness,  and wheezing.   Cardiovascular: Denies chest pain, palpitations , has chronic  leg swelling.  Gastrointestinal: Denies nausea, vomiting, abdominal pain, diarrhea, constipation, blood in stool and abdominal distention.  Genitourinary: Denies dysuria, urgency, frequency, hematuria, flank pain and difficulty urinating.  Endocrine: Denies: hot or cold intolerance,   polyuria, polydipsia. Musculoskeletal: Denies myalgias, back pain, joint swelling, arthralgias and gait problem.  Skin: oozing of  Blood from skin in the abdomen and underneath the breast, Denies pallor, has diffuse psoriasis, denies wound.  Neurological: Denies dizziness, seizures, syncope, weakness, light-headedness, numbness and headaches.  Hematological: Denies adenopathy. Has Easy bruising,  Psychiatric/Behavioral: Denies  mood changes, confusion, nervousness, sleep disturbance and agitation   Past Medical History  Diagnosis Date  . Asthma   . Bronchitis   . Allergic rhinitis   . Diabetes mellitus   . HTN (hypertension)   . DJD (degenerative joint disease)   . Hyperlipidemia   . Complication of anesthesia     trouble waking up  . Obesity   . OSA (obstructive sleep apnea)     poor compliance with cpap  . CHF (congestive heart failure)   . Biventricular failure     compensated  . Psoriasis   . Stasis edema     of lower extremities  . Depression   . Situational stress   . Kidney stones   . Kidney disease, chronic, stage III (GFR 30-59 ml/min)   . Anemia   . Gout   . Chronic anticoagulation   . Yeast infection involving the vagina and surrounding area   . Atrial fibrillation with RVR   . Stroke     denies deficits  . GERD (gastroesophageal reflux  disease)   . Pneumonia 08/03/2013   Past Surgical History  Procedure Laterality Date  . Appendectomy    . Cholecystectomy    . Total knee arthroplasty    . Total abdominal hysterectomy    . Back surgery    .  Cystectomy      left hand   Social History:  reports that she quit smoking about 31 years ago. Her smoking use included Cigarettes. She smoked 0.00 packs per day. She has never used smokeless tobacco. She reports that she does not drink alcohol or use illicit drugs.  Allergies  Allergen Reactions  . Daptomycin Other (See Comments)    Elevated CPK  . Morphine And Related Nausea And Vomiting  . Cephalexin Rash    unknown  . Celecoxib     unknown  . Codeine     unknown  . Fluoxetine Hcl     unknown  . Latex     REACTION: undefined  . Ofloxacin     unknown  . Penicillins     unknown  . Rofecoxib     unknown  . Sulfonamide Derivatives     unknown    Family History  Problem Relation Age of Onset  . Heart attack Father   . Asthma Father   . Heart disease Paternal Uncle   . Rectal cancer Paternal Aunt   . Other Mother     mva    Prior to Admission medications   Medication Sig Start Date End Date Taking? Authorizing Provider  albuterol (PROVENTIL HFA;VENTOLIN HFA) 108 (90 BASE) MCG/ACT inhaler Inhale 2 puffs into the lungs every 6 (six) hours as needed for wheezing or shortness of breath.     Historical Provider, MD  allopurinol (ZYLOPRIM) 100 MG tablet Take 100 mg by mouth daily.  09/16/11   Historical Provider, MD  amiodarone (PACERONE) 200 MG tablet Take 200 mg by mouth daily.    Historical Provider, MD  amLODipine (NORVASC) 10 MG tablet Take 10 mg by mouth daily.    Historical Provider, MD  atorvastatin (LIPITOR) 20 MG tablet Take 20 mg by mouth at bedtime.    Historical Provider, MD  ferrous sulfate 325 (65 FE) MG tablet Take 325 mg by mouth 3 (three) times daily with meals. 08/29/12   Gwenyth Bender, MD  furosemide (LASIX) 40 MG tablet Take 40 mg by mouth 2 (two) times daily.    Historical Provider, MD  hydrALAZINE (APRESOLINE) 50 MG tablet Take 50 mg by mouth 3 (three) times daily.    Historical Provider, MD  insulin aspart (NOVOLOG) 100 UNIT/ML injection Inject 0-20 Units  into the skin 3 (three) times daily before meals. Per sliding scale    Historical Provider, MD  insulin glargine (LANTUS) 100 UNIT/ML injection Inject 20 Units into the skin 2 (two) times daily.    Historical Provider, MD  levofloxacin (LEVAQUIN) 250 MG tablet Take 250 mg by mouth daily.    Historical Provider, MD  metoprolol tartrate (LOPRESSOR) 25 MG tablet Take 1 tablet (25 mg total) by mouth 2 (two) times daily. 08/08/13   Jeralyn Bennett, MD  ondansetron (ZOFRAN) 4 MG tablet Take 1 tablet (4 mg total) by mouth every 6 (six) hours as needed for nausea. 08/08/13   Jeralyn Bennett, MD  oxyCODONE-acetaminophen (PERCOCET/ROXICET) 5-325 MG per tablet Take 1 tablet by mouth every 8 (eight) hours as needed. 08/08/13   Jeralyn Bennett, MD  pantoprazole (PROTONIX) 40 MG tablet Take 40 mg by mouth daily.  Historical Provider, MD  zolpidem (AMBIEN) 5 MG tablet Take 5 mg by mouth at bedtime as needed for sleep.     Historical Provider, MD    Physical Exam:  Filed Vitals:   08/10/13 1145 08/10/13 1200 08/10/13 1215 08/10/13 1230  BP: 162/60 174/67 167/65 163/66  Pulse: 67 68 68 72  Resp: 25 25 19 19   SpO2: 98% 96% 98% 95%    Constitutional: Vital signs reviewed. In brief morbidly obese female lying in bed in no acute distress HEENT: No pallor, no icterus, moist oral mucosa  Cardiovascular: RRR, S1 normal, S2 normal, no MRG, pulses symmetric and intact bilaterally Pulmonary/Chest: CTAB, no wheezes, rales, or rhonchi Abdominal: Soft. Bowel sounds present, actively losing blood from the skin over bilateral lower quadrants left greater than right, periumbilical area, upper thigh and underneath the right breast . ND, NT, BS+, rectal exam with bright red blood in  the gloves, no hemorrhoids noted  Musculoskeletal: limited range of motion over left knee  Ext: chronic venous changes.   Neurological: A&O x3, and focal Labs on Admission:  Basic Metabolic Panel:  Recent Labs Lab 08/04/13 0713  08/05/13 0415 08/06/13 0610 08/07/13 0500 08/10/13 1044 08/10/13 1334  NA 141 140 140 140 140 141  K 4.8 5.0 4.8 4.6 5.6* 5.3*  CL 107 105 103 104 102 108  CO2 26 26 24 26 29   --   GLUCOSE 102* 64* 66* 71 74 63*  BUN 31* 34* 38* 43* 59* 50*  CREATININE 2.01* 2.36* 2.70* 2.56* 2.90* 3.20*  CALCIUM 8.5 8.7 8.7 8.7 9.3  --    Liver Function Tests: No results found for this basename: AST, ALT, ALKPHOS, BILITOT, PROT, ALBUMIN,  in the last 168 hours No results found for this basename: LIPASE, AMYLASE,  in the last 168 hours No results found for this basename: AMMONIA,  in the last 168 hours CBC:  Recent Labs Lab 08/04/13 2230 08/05/13 0415 08/06/13 0610 08/07/13 0500 08/10/13 1044 08/10/13 1334  WBC 9.4 8.5 7.9 7.1 7.5  --   HGB 8.7* 8.7* 9.2* 9.0* 8.9* 9.5*  HCT 28.3* 28.7* 30.1* 30.1* 30.6* 28.0*  MCV 75.1* 75.1* 75.8* 77.0* 76.7*  --   PLT 312 317 347 324 311  --    Cardiac Enzymes: No results found for this basename: CKTOTAL, CKMB, CKMBINDEX, TROPONINI,  in the last 168 hours BNP: No components found with this basename: POCBNP,  CBG:  Recent Labs Lab 08/07/13 1132 08/07/13 1617 08/07/13 2130 08/08/13 0607 08/08/13 1118  GLUCAP 107* 118* 119* 108* 89    Radiological Exams on Admission: No results found.  EKG: pending  Assessment/Plan Principal Problem:   Bright red rectal bleeding Admit to telemetry. Keep patient n.p.o. I will order 2 units FFP to reverse the INR. Continue to Hold Coumadin. Monitor H&H Q8 hours. Patient follows with [facility] and i have asked Dr. Elnoria Howard for consultation.  bleeding into the skin continue superficial dressing for skin oozing.  AKI on CKD (chronic kidney disease), stage IV Baseline creatinine appears to be around 2.5. Creatinine of 2.2 this admission. Will check UA, urine lites and renal ultrasound. Will hold Lasix for now.     Anemia of chronic disease Baseline  hemoglobin around 8. Monitor closely    PAF  (paroxysmal atrial fibrillation) Rate controlled at this time. On Coumadin and metoprolol. Follows with Dr. Sharyn Lull . holding Coumadin. Needs follow up with Dr Sharyn Lull upon discharge.     Hyperkalemia Hold aldactone. Monitor  in tele. Will order kayexalate.    OBSTRUCTIVE SLEEP APNEA Continue bedtime CPAP    HYPERTENSION Resume amlodipine and metoprolol.    Acute combined systolic and diastolic heart failure EF of  40%. Does not appear portable noted clinically. I will order for her Lasix at this time given her worsening renal function Resume Toprol.      Prosthetic joint infection Follows with orthopedics Dr. Magnus Ivan and ID ( Dr Zenaida Niece dam) . Is on levaquin for suppressive abx.    DM (diabetes mellitus) [name] FSG and continue SSI. Will reduce dose of lantus as patient is NPO           DVT prophylaxis: on anticoagulation  Diet: NPO for now until GI eval   Code Status: full  Family Communication: None at bedside Disposition Plan: admit to telemetry  Eddie North [facility] Pager [phone]  If 7PM-7AM, please contact night-coverage [email or web address] Password [email or web address] 08/10/2013, 2:03 PM   Total time spent: 70 minutes

## 2013-08-10 NOTE — Progress Notes (Signed)
Utilization Review Completed.[name] T9/01/2013

## 2013-08-10 NOTE — ED Notes (Signed)
Family at bedside.  Pt cleaned from dried blood from oozing wounds.  Dry 4x4's applied.  Warm blankets given for comfort.

## 2013-08-10 NOTE — ED Notes (Addendum)
Skin cleansed and redressed with water and abd pads. Skin bleeding/oozing from under breasts, and skin folds around bilateral thighs. Linens changed, hospital gown changed, warm blankets given. Resting in position of comfort. Call bell in reach, bedrails up x 2. Family at bedside.

## 2013-08-10 NOTE — ED Notes (Signed)
Per EMS- Pt reporting bleeding since last night under skin folds in abdomen. Woke up this morning in a pool of blood, there is blood pooling in vaginal area. Pt has changed her outfit 3x. Pt taking coumadin. BP 128/58, HR 70, RR 18, 95 % RA. Pt reports this has happened before but the bleeding stopped on its own.

## 2013-08-10 NOTE — Progress Notes (Signed)
Spoke to Dr. Gonzella Lex regarding continuous bleeding from skin folds. 1st unit of  FFP complete. Dressings and bed linen changed multiple times. Paged for further suggestions; he will speak to pharmacy. Patient is not bleeding rectally. Patient has the same thin skin noted to buttocks with occasional bleeding. Blood runs from these folds behind patient and saturates bed. Will continue to monitor.Mamie Levers

## 2013-08-10 NOTE — Progress Notes (Signed)
Pt. Refuses CPAP at this time. Pt. Was made aware to let RT / RN know if she changed her mind anytime during the night & decided to wear CPAP. RN is aware that pt. Refused.

## 2013-08-10 NOTE — Progress Notes (Signed)
Patient received from ED to 2w12. Patient linens saturated with blood. Bleeding noted from bilateral breath folds, abdominal folds, and thigh folds. Skin to these areas are thin and bruised. Patient bathed with gown and linens changed. ABD pads placed to these areas to absorb bleeding.  Patient c/o pain at lower abdominal site. Wound care Rn to see patient. Dr. Gonzella Lex notified of arrival and situation.[name]

## 2013-08-10 NOTE — Consult Note (Signed)
Reason for Consult: Hematochezia Referring Physician: [facility]  [name] HPI: This is a 65 with multiple medical admitted for bleeding from hier skin.  The patient takes coumadin for her afib and she was noted to be supratherapeutic during her last admission.  She scratched her skin and there was oozing and there was the report of some blood in her underwear.  A rectal examination revealed fresh blood.  Her last colonoscopy was with Dr. Loreta Ave in 2007 with findings of pan diverticulosis, hemorrhoids, and a couple of polyps.  Prior to that time she had another colonoscopy with Dr. Loreta Ave with findings of a tubular adenoma.  Currently she is overdue for her follow up examination.  During this admission she was also found to be renally insufficient with an elevated creatinine in the 3 range.  Past Medical History  Diagnosis Date  . Asthma   . Bronchitis   . Allergic rhinitis   . Diabetes mellitus   . HTN (hypertension)   . DJD (degenerative joint disease)   . Hyperlipidemia   . Complication of anesthesia     trouble waking up  . Obesity   . OSA (obstructive sleep apnea)     poor compliance with cpap  . CHF (congestive heart failure)   . Biventricular failure     compensated  . Psoriasis   . Stasis edema     of lower extremities  . Depression   . Situational stress   . Kidney stones   . Kidney disease, chronic, stage III (GFR 30-59 ml/min)   . Anemia   . Gout   . Chronic anticoagulation   . Yeast infection involving the vagina and surrounding area   . Atrial fibrillation with RVR   . Stroke     denies deficits  . GERD (gastroesophageal reflux disease)   . Pneumonia 08/03/2013    Past Surgical History  Procedure Laterality Date  . Appendectomy    . Cholecystectomy    . Total knee arthroplasty    . Total abdominal hysterectomy    . Back surgery    . Cystectomy      left hand    Family History  Problem Relation Age of Onset  . Heart attack Father   .  Asthma Father   . Heart disease Paternal Uncle   . Rectal cancer Paternal Aunt   . Other Mother     mva    Social History:  reports that she quit smoking about 31 years ago. Her smoking use included Cigarettes. She smoked 0.00 packs per day. She has never used smokeless tobacco. She reports that she does not drink alcohol or use illicit drugs.  Allergies:  Allergies  Allergen Reactions  . Daptomycin Other (See Comments)    Elevated CPK  . Morphine And Related Nausea And Vomiting  . Cephalexin Rash    unknown  . Celecoxib     unknown  . Codeine     unknown  . Fluoxetine Hcl     unknown  . Latex     REACTION: undefined  . Ofloxacin     unknown  . Penicillins     unknown  . Rofecoxib     unknown  . Sulfonamide Derivatives     unknown    Medications:  Scheduled: . allopurinol  100 mg Oral Daily  . amiodarone  200 mg Oral Daily  . amLODipine  10 mg Oral Daily  . atorvastatin  20 mg Oral QHS  . docusate  sodium  100 mg Oral BID  . hydrALAZINE  50 mg Oral TID  . insulin aspart  0-15 Units Subcutaneous TID WC  . insulin glargine  20 Units Subcutaneous BID  . levofloxacin  250 mg Oral Daily  . metoprolol tartrate  25 mg Oral BID  . pantoprazole  40 mg Oral Daily  . sodium chloride  3 mL Intravenous Q12H  . sodium polystyrene  15 g Oral Once   Continuous:   Results for orders placed during the hospital encounter of 08/10/13 (from the past 24 hour(s))  CBC     Status: Abnormal   Collection Time    08/10/13 10:44 AM      Result Value Range   WBC 7.5  4.0 - 10.5 K/uL   RBC 3.99  3.87 - 5.11 MIL/uL   Hemoglobin 8.9 (*) 12.0 - 15.0 g/dL   HCT 16.1 (*) 09.6 - 04.5 %   MCV 76.7 (*) 78.0 - 100.0 fL   MCH 22.3 (*) 26.0 - 34.0 pg   MCHC 29.1 (*) 30.0 - 36.0 g/dL   RDW 40.9 (*) 81.1 - 91.4 %   Platelets 311  150 - 400 K/uL  BASIC METABOLIC PANEL     Status: Abnormal   Collection Time    08/10/13 10:44 AM      Result Value Range   Sodium 140  135 - 145 mEq/L    Potassium 5.6 (*) 3.5 - 5.1 mEq/L   Chloride 102  96 - 112 mEq/L   CO2 29  19 - 32 mEq/L   Glucose, Bld 74  70 - 99 mg/dL   BUN 59 (*) 6 - 23 mg/dL   Creatinine, Ser 7.82 (*) 0.50 - 1.10 mg/dL   Calcium 9.3  8.4 - 95.6 mg/dL   GFR calc non Af Amer 16 (*) >90 mL/min   GFR calc Af Amer 19 (*) >90 mL/min  PROTIME-INR     Status: Abnormal   Collection Time    08/10/13 10:44 AM      Result Value Range   Prothrombin Time 28.3 (*) 11.6 - 15.2 seconds   INR 2.77 (*) 0.00 - 1.49  APTT     Status: Abnormal   Collection Time    08/10/13 10:44 AM      Result Value Range   aPTT 51 (*) 24 - 37 seconds  OCCULT BLOOD, POC DEVICE     Status: Abnormal   Collection Time    08/10/13 11:40 AM      Result Value Range   Fecal Occult Bld POSITIVE (*) NEGATIVE  TYPE AND SCREEN     Status: None   Collection Time    08/10/13  1:08 PM      Result Value Range   ABO/RH(D) A POS     Antibody Screen NEG     Sample Expiration 08/13/2013    LACTIC ACID, PLASMA     Status: None   Collection Time    08/10/13  1:08 PM      Result Value Range   Lactic Acid, Venous 0.9  0.5 - 2.2 mmol/L  POCT I-STAT, CHEM 8     Status: Abnormal   Collection Time    08/10/13  1:34 PM      Result Value Range   Sodium 141  135 - 145 mEq/L   Potassium 5.3 (*) 3.5 - 5.1 mEq/L   Chloride 108  96 - 112 mEq/L   BUN 50 (*) 6 - 23 mg/dL  Creatinine, Ser 3.20 (*) 0.50 - 1.10 mg/dL   Glucose, Bld 63 (*) 70 - 99 mg/dL   Calcium, Ion 1.61 (*) 1.13 - 1.30 mmol/L   TCO2 29  0 - 100 mmol/L   Hemoglobin 9.5 (*) 12.0 - 15.0 g/dL   HCT 09.6 (*) 04.5 - 40.9 %  PREPARE FRESH FROZEN PLASMA     Status: None   Collection Time    08/10/13  2:02 PM      Result Value Range   Unit Number [id number]     Blood Component Type THAWED PLASMA     Unit division 00     Status of Unit ISSUED     Transfusion Status OK TO TRANSFUSE     Unit Number [id number]     Blood Component Type THAWED PLASMA     Unit division 00     Status of Unit  ALLOCATED     Transfusion Status OK TO TRANSFUSE       No results found.  ROS:  As stated above in the HPI otherwise negative.  Blood pressure 161/68, pulse 65, temperature 97.7 F (36.5 C), temperature source Oral, resp. rate 20, height 5\' 3"  (1.6 m), weight 266 lb (120.657 kg), SpO2 92.00%.    PE: Gen: NAD, Alert and Oriented HEENT:  [place]/AT, EOMI Neck: Supple, no LAD Lungs: CTA Bilaterally CV: RRR without M/G/R ABM: Soft, NTND, +BS, Morbidly obese, multiple skin sites with active bleeding Ext: chronic edema of the lower extremities  Assessment/Plan: 1) Hematochezia. 2) Personal history of polyps. 3) Diverticulosis. 4) Renal insufficiency.   I arrived just as the nursing staff was finished with cleaning her up.  Nursing showed me the amount of bleeding that is occuring from her skin lesions.  No reports of any hematochezia.  The patient is stable, but she appears to be chronically debilitated.  She will require a repeat colonoscopy, but it is more important to wait for her renal insufficiency to return to baseline.  From the cardiac standpoint she appears to be stable, however, her BNP is elevated.    Plan: 1) Continue with supportive care. 2) Follow HGB and renal function. 3) ? Colonoscopy later this week or as an outpatient.    Polette Nofsinger D 08/10/2013, 4:36 PM

## 2013-08-10 NOTE — ED Provider Notes (Signed)
CSN: [id number]     Arrival date & time 08/10/13  1028 History   First MD Initiated Contact with Patient 08/10/13 1030     Chief Complaint  Patient presents with  . Coagulation Disorder   (Consider location/radiation/quality/duration/timing/severity/associated sxs/prior Treatment) Patient is a 62 y.o. female presenting with hematochezia. The history is provided by the patient and the EMS personnel. No language interpreter was used.  Rectal Bleeding Quality:  Bright red Amount:  Moderate Duration:  2 days Timing:  Intermittent Progression:  Worsening Chronicity:  New Context: not anal fissures, not anal penetration, not diarrhea, not rectal injury, not rectal pain and not spontaneously   Similar prior episodes: no   Relieved by:  Nothing Worsened by:  Nothing tried Ineffective treatments:  None tried Associated symptoms: light-headedness   Associated symptoms: no abdominal pain, no fever and no vomiting   Risk factors: anticoagulant use   Risk factors comment:  Anemia   Past Medical History  Diagnosis Date  . Asthma   . Bronchitis   . Allergic rhinitis   . Diabetes mellitus   . HTN (hypertension)   . DJD (degenerative joint disease)   . Hyperlipidemia   . Complication of anesthesia     trouble waking up  . Obesity   . OSA (obstructive sleep apnea)     poor compliance with cpap  . CHF (congestive heart failure)   . Biventricular failure     compensated  . Psoriasis   . Stasis edema     of lower extremities  . Depression   . Situational stress   . Kidney stones   . Kidney disease, chronic, stage III (GFR 30-59 ml/min)   . Anemia   . Gout   . Chronic anticoagulation   . Yeast infection involving the vagina and surrounding area   . Atrial fibrillation with RVR   . Stroke     denies deficits  . GERD (gastroesophageal reflux disease)   . Pneumonia 08/03/2013   Past Surgical History  Procedure Laterality Date  . Appendectomy    . Cholecystectomy    . Total knee  arthroplasty    . Total abdominal hysterectomy    . Back surgery    . Cystectomy      left hand   Family History  Problem Relation Age of Onset  . Heart attack Father   . Asthma Father   . Heart disease Paternal Uncle   . Rectal cancer Paternal Aunt   . Other Mother     mva   History  Substance Use Topics  . Smoking status: Former Smoker    Types: Cigarettes    Quit date: 07/05/1982  . Smokeless tobacco: Never Used     Comment: smoked for 1.5 yrs about 2 cigs a day ,only if stressed  . Alcohol Use: No   OB History   Grav Para Term Preterm Abortions TAB SAB Ect Mult Living                 Review of Systems  Constitutional: Negative for fever.  HENT: Negative for congestion, sore throat and rhinorrhea.   Respiratory: Positive for shortness of breath. Negative for cough.   Cardiovascular: Negative for chest pain.  Gastrointestinal: Positive for hematochezia. Negative for nausea, vomiting, abdominal pain and diarrhea.  Genitourinary: Negative for dysuria and hematuria.  Skin: Negative for rash.  Neurological: Positive for light-headedness. Negative for syncope and headaches.  All other systems reviewed and are negative.    Allergies  Daptomycin; Morphine and related; Cephalexin; Celecoxib; Codeine; Fluoxetine hcl; Latex; Ofloxacin; Penicillins; Rofecoxib; and Sulfonamide derivatives  Home Medications   Current Outpatient Rx  Name  Route  Sig  Dispense  Refill  . albuterol (PROVENTIL HFA;VENTOLIN HFA) 108 (90 BASE) MCG/ACT inhaler   Inhalation   Inhale 2 puffs into the lungs every 6 (six) hours as needed for wheezing or shortness of breath.          . allopurinol (ZYLOPRIM) 100 MG tablet   Oral   Take 100 mg by mouth daily.          [name] amiodarone (PACERONE) 200 MG tablet   Oral   Take 200 mg by mouth daily.         [name] amLODipine (NORVASC) 10 MG tablet   Oral   Take 10 mg by mouth daily.         [name] atorvastatin (LIPITOR) 20 MG tablet   Oral   Take 20  mg by mouth at bedtime.         . ferrous sulfate 325 (65 FE) MG tablet   Oral   Take 325 mg by mouth 3 (three) times daily with meals.         . furosemide (LASIX) 40 MG tablet   Oral   Take 40 mg by mouth 2 (two) times daily.         . hydrALAZINE (APRESOLINE) 50 MG tablet   Oral   Take 50 mg by mouth 3 (three) times daily.         . insulin aspart (NOVOLOG) 100 UNIT/ML injection   Subcutaneous   Inject 0-20 Units into the skin 3 (three) times daily before meals. Per sliding scale         . insulin glargine (LANTUS) 100 UNIT/ML injection   Subcutaneous   Inject 20 Units into the skin 2 (two) times daily.         [name] levofloxacin (LEVAQUIN) 250 MG tablet   Oral   Take 250 mg by mouth daily.         . metoprolol tartrate (LOPRESSOR) 25 MG tablet   Oral   Take 1 tablet (25 mg total) by mouth 2 (two) times daily.   60 tablet   0   . ondansetron (ZOFRAN) 4 MG tablet   Oral   Take 1 tablet (4 mg total) by mouth every 6 (six) hours as needed for nausea.   20 tablet   0   . oxyCODONE-acetaminophen (PERCOCET/ROXICET) 5-325 MG per tablet   Oral   Take 1 tablet by mouth every 8 (eight) hours as needed.   10 tablet   0   . pantoprazole (PROTONIX) 40 MG tablet   Oral   Take 40 mg by mouth daily.         [name] spironolactone (ALDACTONE) 25 MG tablet   Oral   Take 25 mg by mouth 2 (two) times daily.          [name] zolpidem (AMBIEN) 5 MG tablet   Oral   Take 5 mg by mouth at bedtime as needed for sleep.           There were no vitals taken for this visit. Physical Exam  Nursing note and vitals reviewed. Constitutional: She is oriented to person, place, and time. She appears well-developed and well-nourished. No distress.  HENT:  Head: Normocephalic and atraumatic.  Eyes: EOM are normal. Pupils are equal, round, and reactive to light.  Neck: Normal  range of motion. Neck supple.  Cardiovascular: Normal rate, regular rhythm, normal heart sounds and intact  distal pulses.  Exam reveals no gallop and no friction rub.   No murmur heard. Pulmonary/Chest: Effort normal and breath sounds normal. No respiratory distress. She has no wheezes. She has no rales. She exhibits no tenderness.  Abdominal: Soft. Bowel sounds are normal. She exhibits distension. There is no tenderness. There is no rebound.  Genitourinary: Rectal exam shows no internal hemorrhoid, no fissure and no tenderness. Pelvic exam was performed with patient prone. There is no rash, tenderness, lesion or injury on the right labia. There is no rash, tenderness, lesion or injury on the left labia. Cervix exhibits no motion tenderness, no discharge and no friability. Right adnexum displays no mass, no tenderness and no fullness. Left adnexum displays no mass, no tenderness and no fullness.  Musculoskeletal: Normal range of motion. She exhibits no edema and no tenderness.  Lymphadenopathy:    She has no cervical adenopathy.  Neurological: She is alert and oriented to person, place, and time. She displays normal reflexes. No cranial nerve deficit. She exhibits normal muscle tone. Coordination normal.  Skin: Skin is warm. No rash noted. She is not diaphoretic.  Oozing blood from abdominal skin  folds  Psychiatric: She has a normal mood and affect. Her behavior is normal.    ED Course  Procedures (including critical care time) Labs Review Labs Reviewed  CBC - Abnormal; Notable for the following:    Hemoglobin 8.9 (*)    HCT 30.6 (*)    MCV 76.7 (*)    MCH 22.3 (*)    MCHC 29.1 (*)    RDW 21.7 (*)    All other components within normal limits  BASIC METABOLIC PANEL - Abnormal; Notable for the following:    Potassium 5.6 (*)    BUN 59 (*)    Creatinine, Ser 2.90 (*)    GFR calc non Af Amer 16 (*)    GFR calc Af Amer 19 (*)    All other components within normal limits  PROTIME-INR - Abnormal; Notable for the following:    Prothrombin Time 28.3 (*)    INR 2.77 (*)    All other components  within normal limits  APTT - Abnormal; Notable for the following:    aPTT 51 (*)    All other components within normal limits  OCCULT BLOOD, POC DEVICE - Abnormal; Notable for the following:    Fecal Occult Bld POSITIVE (*)    All other components within normal limits  POCT I-STAT, CHEM 8 - Abnormal; Notable for the following:    Potassium 5.3 (*)    BUN 50 (*)    Creatinine, Ser 3.20 (*)    Glucose, Bld 63 (*)    Calcium, Ion 1.09 (*)    Hemoglobin 9.5 (*)    HCT 28.0 (*)    All other components within normal limits  LACTIC ACID, PLASMA  TYPE AND SCREEN  PREPARE FRESH FROZEN PLASMA   Imaging Review No results found.  MDM   1. GI bleed    10:39 AM Pt is a 62 y.o. female with pertinent PMHX of Asthma, Bronchitis who presents to the ED with bleeding. Pt has had some bleeding around skin folds in her abdomen last night. Pt awoke this morning in a pool of blood. EMS noted blood pooling in vaginal area. Pt is on coumadin. Endorses lightheadedness and shortness of breath, no syncope and no chest pain. No recent  trauma REcently admitted for PNA.   On exam: AFVSS. Clots noted to left thigh. Oozing of blood from abdominal skin folds. GU exam: no bleeding or pooling of blood. Rectal exam no gross blood, dark stool noted. Plan for CBC, PT/INR. APTT, CBC, lactic acid, BMP  EKG personally reviewed by myself showed NSR, PAC Rate of 65, PR , QRS 76ms QT/QTC 417434 ms, normal axis, without evidence of new ischemia. Comparison showed similar, indication: bleeding  Review of labs: stool hemoccult positive. H&H 8.930.6. BMP with hyperkalemia: will repeat K. BUN/Cr 592.90. INR 2.77 therapeutic. Given history of anemia, rectal bleeding concern for GI bleed, will consult hospitalist for admission for admission. Repeat K 5.3, no peaked T waves on EKG. Will hold off on Kayexalate pt is not having bowel movements. Will hold off on acute temporizing measures for now.  The patient appears reasonably  stabilized for admission considering the current resources, flow, and capabilities available in the ED at this time, and I doubt any other [facility] requiring further screening and/or treatment in the ED prior to admission.   Plan for admission to hospitalist for further evaluation and management. PT transferred to floor w telemetry VSS.   Labs, EKG and imaging reviewed by myself and considered in medical decision making if ordered.  Imaging interpreted by radiology. Pt was discussed with my attending, Dr. Ron Agee, MD 08/10/13 [phone]

## 2013-08-10 NOTE — Consult Note (Addendum)
WOC consult Note Reason for Consult: Consult requested for wounds with excessive bleeding in skin folds. Wound type: Multpile patchy areas of partial thickness skin loss to abd and breast skin folds. Measurement: No open wounds to measure.  All areas with bleeding appear to be partial thickness pinhole openings or linear scratches. Drainage (amount, consistency, odor) Significant amt bright red blood from all sites mentioned above.  Left groin has an ABD pad which is saturated and was placed there less than 1 hour ago, according to bedside nurse. During the consult while charting, foam pads have already become saturated with blood. Dressing procedure/placement/frequency: Abd pads will have to be used to control bleeding.  Apply xeroform to skin to decrease sticking to skin and discomfort with dressing changes.   Recommend surgical consult; pt may require silver nitrate since topical wound care has not decreased amt of bleeding. Bedside nurse has let primary team know that bleeding has continued to multiple sites at this time. Please re-consult if further assistance is needed.  Thank-you,  Cammie Mcgee MSN, RN, CWOCN, [place], CNS [phone]

## 2013-08-10 NOTE — ED Provider Notes (Signed)
I saw and evaluated the patient, reviewed the resident's note and I agree with the findings and plan. Patient is a 62 year old female with extensive past medical history including anemia, atrial fibrillation for which she is on Coumadin. She presents today with complaints of bleeding from the perineum. She is unsure if this is vaginal or gastrointestinal in nature. She denies any abdominal pain, fever, or chills. She was recently admitted for pneumonia the  On exam patient is afebrile and vitals are stable. She is awake, alert, and oriented. The heart is regular rate and rhythm without murmurs. The lungs are clear to auscultation and equal bilaterally. The abdomen is somewhat firm, however there is no tenderness to palpation and bowel sounds are present.  Workup reveals a hemoglobin of 8.9 along with Hemoccult positive stool, and INR of 2.77. Exam reveals the bleeding to be gastrointestinal in nature, likely from bleeding diverticuli. The case is discussed with triad who agrees to admit the patient to their service.  Geoffery Lyons, MD 08/10/13 1416

## 2013-08-10 NOTE — Progress Notes (Signed)
Bed saturated with blood. Patient continues to bleed from previous stated sites; some clots noted. Patient cleaned with gown and linens changed. Foam dressings saturated. Dressing removed and replaced with xeroform petrolatum dressing and ABD pads. Foam dressing applied to left hip. FFP infusing; patient tolerating well. Call bell near. Will continue to monitor.[name]

## 2013-08-11 ENCOUNTER — Inpatient Hospital Stay (HOSPITAL_COMMUNITY): Payer: Medicare HMO

## 2013-08-11 DIAGNOSIS — J189 Pneumonia, unspecified organism: Secondary | ICD-10-CM

## 2013-08-11 LAB — PREPARE FRESH FROZEN PLASMA: Unit division: 0

## 2013-08-11 LAB — URINE MICROSCOPIC-ADD ON

## 2013-08-11 LAB — CBC
Hemoglobin: 6.8 g/dL — CL (ref 12.0–15.0)
Hemoglobin: 7.5 g/dL — ABNORMAL LOW (ref 12.0–15.0)
MCH: 24.1 pg — ABNORMAL LOW (ref 26.0–34.0)
MCV: 78.1 fL (ref 78.0–100.0)
RBC: 3.03 MIL/uL — ABNORMAL LOW (ref 3.87–5.11)
RBC: 3.11 MIL/uL — ABNORMAL LOW (ref 3.87–5.11)

## 2013-08-11 LAB — BASIC METABOLIC PANEL
Chloride: 107 mEq/L (ref 96–112)
GFR calc Af Amer: 21 mL/min — ABNORMAL LOW (ref >90)
Potassium: 5.6 mEq/L — ABNORMAL HIGH (ref 3.5–5.1)

## 2013-08-11 LAB — URINALYSIS, ROUTINE W REFLEX MICROSCOPIC
Bilirubin Urine: NEGATIVE
Glucose, UA: NEGATIVE mg/dL
Ketones, ur: NEGATIVE mg/dL
pH: 8 (ref 5.0–8.0)

## 2013-08-11 LAB — GLUCOSE, CAPILLARY
Glucose-Capillary: 80 mg/dL (ref 70–99)
Glucose-Capillary: 62 mg/dL — ABNORMAL LOW (ref 70–99)
Glucose-Capillary: 101 mg/dL — ABNORMAL HIGH (ref 70–99)
Glucose-Capillary: 85 mg/dL (ref 70–99)

## 2013-08-11 LAB — PROTIME-INR
INR: 2.18 — ABNORMAL HIGH (ref 0.00–1.49)
Prothrombin Time: 23.6 seconds — ABNORMAL HIGH (ref 11.6–15.2)

## 2013-08-11 LAB — PREPARE RBC (CROSSMATCH)

## 2013-08-11 MED ORDER — GLUCOSE-VITAMIN C 4-6 GM-MG PO CHEW: 4.0000 | CHEWABLE_TABLET | ORAL | Status: DC | PRN

## 2013-08-11 MED ORDER — GLUCOSE 40 % PO GEL: 1.0000 | ORAL | Status: DC | PRN

## 2013-08-11 MED ORDER — PRO-STAT SUGAR FREE PO LIQD
30.0000 mL | Freq: Two times a day (BID) | ORAL | Status: DC
  Administered 2013-08-11 – 2013-08-18 (×10): 30 mL via ORAL
  Filled 2013-08-11 (×18): qty 30

## 2013-08-11 MED ORDER — POTASSIUM CHLORIDE CRYS ER 20 MEQ PO TBCR
EXTENDED_RELEASE_TABLET | ORAL | Status: AC
  Filled 2013-08-11: qty 2

## 2013-08-11 MED ORDER — BOOST / RESOURCE BREEZE PO LIQD
1.0000 | Freq: Two times a day (BID) | ORAL | Status: DC
  Administered 2013-08-11 – 2013-08-17 (×10): 1 via ORAL

## 2013-08-11 NOTE — Progress Notes (Signed)
INITIAL NUTRITION ASSESSMENT  DOCUMENTATION CODES Per approved criteria  -Morbid Obesity   INTERVENTION: 1. Resource Breeze po BID, each supplement provides 250 kcal and 9 grams of protein.   2. 30 ml Pro-stat TID, each supplement provides 100 kcal and 15 gm protein    NUTRITION DIAGNOSIS: Increased nutrient needs related to wound healing as evidenced by multiple areas of partial thickness loss .   Goal: PO intake to meet >/=90% estimated nutrition needs  Monitor:  PO intake, supplement tolerance, diet advance, weight trends, labs   Reason for Assessment: Low Braden Score  62 y.o. female  Admitting Dx: Bright red rectal bleeding  ASSESSMENT: Pt admitted with bleeding from multiple sites. Pt on coumadin for afib. Wound RN notes reviewed. Pt with many places of partial thickness skin loss and bleeding.   Pt reports she has been eating poorly for several days. Ate well this morning, but is on clear liquid diet. Pt has increased protein needs to support wound healing. Current diet provides little protein, will add oral nutrition supplements until diet can be advanced.   Height: Ht Readings from Last 1 Encounters:  08/10/13 5\' 3"  (1.6 m)    Weight: Wt Readings from Last 1 Encounters:  08/10/13 266 lb (120.657 kg)    Ideal Body Weight: 115 lbs   % Ideal Body Weight: 231%  Wt Readings from Last 10 Encounters:  08/10/13 266 lb (120.657 kg)  08/08/13 264 lb 12.4 oz (120.1 kg)  07/07/13 270 lb 8 oz (122.698 kg)  02/18/13 264 lb 5.3 oz (119.9 kg)  12/07/12 279 lb (126.554 kg)  01/04/13 270 lb (122.471 kg)  09/03/12 261 lb (118.389 kg)  08/29/12 249 lb 4.8 oz (113.082 kg)  05/26/12 264 lb 4 oz (119.863 kg)  04/28/12 259 lb 11.2 oz (117.8 kg)    Usual Body Weight: 270 lbs per weight hx  % Usual Body Weight: 98%  BMI:  Body mass index is 47.13 kg/(m^2). Obesity class 3, extreme  Estimated Nutritional Needs: Kcal: 2000-2200 Protein: 85-100 gm  Fluid: 2-2.2 L    Skin: multiple areas of partial thickness loss in skin folds   Diet Order: Clear Liquid  EDUCATION NEEDS: -No education needs identified at this time   Intake/Output Summary (Last 24 hours) at 08/11/13 0933 Last data filed at 08/11/13 0850  Gross per 24 hour  Intake 1369.5 ml  Output    100 ml  Net 1269.5 ml    Last BM: 9/3   Labs:   Recent Labs Lab 08/07/13 0500 08/10/13 1044 08/10/13 1334 08/11/13 0510  NA 140 140 141 144  K 4.6 5.6* 5.3* 5.6*  CL 104 102 108 107  CO2 26 29  --  29  BUN 43* 59* 50* 51*  CREATININE 2.56* 2.90* 3.20* 2.67*  CALCIUM 8.7 9.3  --  8.3*  GLUCOSE 71 74 63* 86   Lab Results  Component Value Date   INR 2.18* 08/11/2013   INR 2.77* 08/10/2013   INR 3.16* 08/08/2013    CBG (last 3)   Recent Labs  08/10/13 1638 08/10/13 1708 08/10/13 2200  GLUCAP 59* 93 100*    Scheduled Meds: . allopurinol  100 mg Oral Daily  . amiodarone  200 mg Oral Daily  . amLODipine  10 mg Oral Daily  . atorvastatin  20 mg Oral QHS  . docusate sodium  100 mg Oral BID  . hydrALAZINE  50 mg Oral TID  . insulin aspart  0-15 Units Subcutaneous TID WC  .  insulin glargine  20 Units Subcutaneous BID  . levofloxacin  250 mg Oral Daily  . metoprolol tartrate  25 mg Oral BID  . pantoprazole  40 mg Oral Daily  . sodium chloride  3 mL Intravenous Q12H    Continuous Infusions:   Past Medical History  Diagnosis Date  . Asthma   . Bronchitis   . Allergic rhinitis   . HTN (hypertension)   . Hyperlipidemia   . Obesity   . OSA (obstructive sleep apnea)     poor compliance with cpap  . CHF (congestive heart failure)   . Biventricular failure     compensated  . Psoriasis   . Stasis edema     of lower extremities  . Depression   . Situational stress   . Kidney stones   . Kidney disease, chronic, stage III (GFR 30-59 ml/min)   . Anemia   . Gout   . Chronic anticoagulation   . Yeast infection involving the vagina and surrounding area   . Atrial  fibrillation with RVR   . GERD (gastroesophageal reflux disease)   . Complication of anesthesia     "they gave me too much; I stayed out of it for awhile" (08/10/2013)  . Chronic bronchitis     "get it q yr" (08/10/2013)  . Pneumonia     "said I did on 08/03/2013 then they ruled it out" (08/10/2013)  . Shortness of breath     "all the time" (08/10/2013)  . On home oxygen therapy     "2L during the night and prn during the day" (08/10/2013)  . Type II diabetes mellitus   . History of blood transfusion     "few during my lifetime; last time was 4 days ago when I got 2 units" (08/10/2013)  . Stroke ?2007    denies residuals on 08/10/2013  . DJD (degenerative joint disease)   . Bleeding     "from my skin folds; just won't stop" (08/10/2013)    Past Surgical History  Procedure Laterality Date  . Appendectomy    . Cholecystectomy    . Total knee arthroplasty Left ~ 2006  . Total abdominal hysterectomy    . Back surgery    . Cystectomy Left     hand  . Tonsillectomy    . Joint replacement    . Lumbar disc surgery      Clarene Duke RD, LDN Pager [phone] After Hours pager [phone]

## 2013-08-11 NOTE — Progress Notes (Signed)
Inpatient Diabetes Program Recommendations  AACE/ADA: New Consensus Statement on Inpatient Glycemic Control (2013)  Target Ranges:  Prepandial:   less than 140 mg/dL      Peak postprandial:   less than 180 mg/dL (1-2 hours)      Critically ill patients:  140 - 180 mg/dL  Results for [name], [name] (MRN [id number]) as of 08/11/2013 14:57  Ref. Range 08/10/2013 16:38 08/10/2013 17:08 08/10/2013 22:00 08/11/2013 06:18 08/11/2013 11:14  Glucose-Capillary Latest Range: 70-99 mg/dL 59 (L) 93 147 (H) 80 79   Inpatient Diabetes Program Recommendations Insulin - Basal: Recommend decrease Lantus to 1/2 home dose 20 units  Thank you  Piedad Climes BSN, RN,CDE Inpatient Diabetes Coordinator [phone] (team pager)

## 2013-08-11 NOTE — Progress Notes (Signed)
Pt. Refused cpap. Pt. States she does not wear cpap at home due to not having all the complete equipment needed. Pt. States she does not want to wear cpap tonight because it will interfere with her having to go to the restroom. RT informed pt. That we would help put her cpap on and off as needed. Pt. Still refused cpap. RT informed pt. To notify if she changed her mind. Pt. Is tolerating nasal cannula at this time.

## 2013-08-11 NOTE — Care Management Note (Signed)
    Page 1 of 2   08/19/2013     2:50:48 PM   CARE MANAGEMENT NOTE 08/19/2013  Patient:  [name], [name]   Account Number:  [id number]  Date Initiated:  08/11/2013  Documentation initiated by:  Lanita Stammen  Subjective/Objective Assessment:   PT ADM ON 08/10/13 WITH ANEMIA, BLEEDING INTO THE SKIN.  PTA, PT RESIDES AT HOME WITH SPOUSE.     Action/Plan:   WILL FOLLOW FOR HOME NEEDS AS PT PROGRESSES.   Anticipated DC Date:  08/15/2013   Anticipated DC Plan:  HOME W HOME HEALTH SERVICES      DC Planning Services  CM consult      [facility] Choice  HOME HEALTH   Choice offered to / List presented to:  C-1 Patient   DME arranged  HOSPITAL BED  [facility]  BEDSIDE COMMODE  SHOWER STOOL      DME agency  [facility]     HH arranged  HH-1 RN  HH-2 PT  HH-3 OT      HH agency  [facility]   Status of service:  Completed, signed off Medicare Important Message given?   (If response is "NO", the following Medicare IM given date fields will be blank) Date Medicare IM given:   Date Additional Medicare IM given:    Discharge Disposition:  HOME W HOME HEALTH SERVICES  Per UR Regulation:  Reviewed for med. necessity/level of care/duration of stay  If discussed at Long Length of Stay Meetings, dates discussed:   08/19/2013    Comments:  08/19/13 Akyah Lagrange,RN,BSN [phone] PT FOR DC HOME TODAY.  [facility] NOTIFIED OF DC DATE. SPOKE WITH [facility], REGARDING PENDING DME ORDER--THEY STATE THAT THEY ARE ALMOST THROUGH GETTING INSURANCE APPROVAL ON ALL EQUIPMENT, AND SHOULD BE ABLE TO DELIVER NEEDED EQUIPMENT SOMETIME LATER THIS EVENING.  08/18/13 Cristie Mckinney,RN,BSN [phone] PT TO DC ON HOME [facility], LIKELY TOMORROW.   Enoxaparin is covered. Patient will pay $37.56 copay - no prior auth required - LEFT MESSAGE WITH [facility] TO CHECK STATUS OF DME REFERRAL.  08/17/13  Elysse Polidore,RN,BSN [phone] PT FOR POSSIBLE DC ON 08/18/13, PER MD.  MAY NEED HOME LOVENOX; WILL CHECK COVERAGE.  CONFIRMED  HH FOLLOW UP WITH [facility]; WILL NEED HHRN, PT, AND OT.  PT NEEDS HOSP BED WITH [facility] AND 3 IN 1 FOR HOME.  FAXED REFERRAL TO [facility] AT [phone].  08/13/13 1055a debbie dowell rn,bsn pt act w [facility]. alerted mary w [facility] of adm. pt would like a hosp bed w [facility]. she would have to use [facility] for eq since her since her ins is [employer] and they contract w [facility]. [facility] cannot tell me copay or if covered until they get order and run. have spoken w dr Rhona Leavens and he will talk w pt and decide whether needed or not. await order.

## 2013-08-11 NOTE — Progress Notes (Signed)
CRITICAL VALUE ALERT  Critical value received:  HGB 6.8  Date of notification:  08/11/2013  Time of notification:  0645  Critical value read back:yes  Nurse who received alert:  Evangeline Dakin  MD notified (1st page):  Donnamarie Poag  Time of first page:  [phone]  MD notified (2nd page):  Time of second page:  Responding MD:  Awaiting response  Time MD responded:

## 2013-08-11 NOTE — Progress Notes (Signed)
Abdominal dressing changed. Bleeding is improving from yesterday.2nd unit of FFP infusing at this time. Patient tolerating well. VSS. Will continue to monitor. Call bell near.[name]

## 2013-08-11 NOTE — Clinical Documentation Improvement (Signed)
THIS DOCUMENT IS NOT A PERMANENT PART OF THE MEDICAL RECORD  Please update your documentation with the medical record to reflect your response to this query. If you need help knowing how to do this please call [phone].  08/11/13  Dear Dr. Deno Etienne Marton Redwood,  In an effort to better capture your patient's severity of illness, reflect appropriate length of stay and utilization of resources, a review of the patient medical record has revealed the following indicators.    Based on your clinical judgment, please clarify and document in a progress note and/or discharge summary the clinical condition associated with the following supporting information:    Possible Clinical Conditions?   " Acute Blood Loss Anemia  " Acute on chronic blood loss anemia  " Precipitous drop in Hematocrit  " Other Condition  " Cannot Clinically Determine     Risk Factors: Principal problem of bright red rectal bleeding/hematochezia noted per 9/02 progress notes. Bright red bleeding from all wound sites with bleeding noted in skin folds, noted per wound nurse's 9/02 assessment.    Diagnostics: H&H on 9/02:  9.5/28.0 H&H on 9/03:  6.8/23.1  Treatments: Transfusion: 9/03:  PRBC & FFP   Reviewed:  No additional documentation provided.                                 Thank You,  Marciano Sequin,  Clinical Documentation Specialist: [phone] Health Information Management [place]

## 2013-08-11 NOTE — Progress Notes (Signed)
[facility] PROGRESS NOTE  [name] ZOX:[id number] DOB: February 12, 1951 DOA: 08/10/2013 PCP: [name] [name], MD  Assessment/Plan: Bright red rectal bleeding  - Overnight events noted - Pt anemic, requiring PRBC tx - GI consulted. Will f/u recs.  bleeding into the skin  continue superficial dressing for skin oozing.  AKI on CKD (chronic kidney disease), stage IV  - Baseline creatinine appears to be around 2.5. Creatinine of 2.2 on admission.  - Follow up renal ultrasound.  - Will cont to hold Lasix for now.  Anemia of chronic disease  - Baseline hemoglobin around 8. PAF (paroxysmal atrial fibrillation)  - Rate controlled at this time. Normally on Coumadin and metoprolol. Follows with Dr. Sharyn [name] . - Holding Coumadin secondary to bleedign.  - Would follow up with Dr [name] upon discharge.  Hyperkalemia  - Hold aldactone. Monitor in tele.  OBSTRUCTIVE SLEEP APNEA  Continue bedtime CPAP  HYPERTENSION  Resume amlodipine and metoprolol.  Acute combined systolic and diastolic heart failure  EF of 81%. Does not appear portable noted clinically.  Resume Toprol.  Prosthetic joint infection  Follows with orthopedics Dr. Magnus [name] and ID ( Dr Nancy Niece [name]) . Is on levaquin for suppressive abx.  DM (diabetes mellitus)  [name] FSG and continue SSI. Will reduce dose of lantus as patient is NPO  Code Status: Full Family Communication:  Pt in room (indicate person spoken with, relationship, and if by phone, the number) Disposition Plan: Pending  Consultants:  GI  Antibiotics:  Levaquin 08/10/13>>>  HPI/Subjective: No major complaints  Objective: Filed Vitals:   08/11/13 0453 08/11/13 0829 08/11/13 0850 08/11/13 0950  BP: 143/44 155/48 155/54 147/50  Pulse: 56 54 54 54  Temp: 98 F (36.7 C) 97.6 F (36.4 C) 97.6 F (36.4 C) 97.9 F (36.6 C)  TempSrc: Oral  Oral Oral  Resp: 16 18 18 18   Height:      Weight:      SpO2: 100% 99% 99% 99%    Intake/Output Summary (Last 24  hours) at 08/11/13 1022 Last data filed at 08/11/13 0850  Gross per 24 hour  Intake 1369.5 ml  Output    100 ml  Net 1269.5 ml   Filed Weights   08/10/13 1435  Weight: 120.657 kg (266 lb)    Exam:   General:  Awake, in nad  Cardiovascular: Regular, s1, s2  Respiratory: normal resp effort, no wheezing  Abdomen: sort, nondistended  Musculoskeletal: perfused, no clubbing   Data Reviewed: Basic Metabolic Panel:  Recent Labs Lab 08/05/13 0415 08/06/13 0610 08/07/13 0500 08/10/13 1044 08/10/13 1334 08/11/13 0510  NA 140 140 140 140 141 144  [name] 5.0 4.8 4.6 5.6* 5.3* 5.6*  CL 105 103 104 102 108 107  CO2 26 24 26 29   --  29  GLUCOSE 64* 66* 71 74 63* 86  BUN 34* 38* 43* 59* 50* 51*  CREATININE 2.36* 2.70* 2.56* 2.90* 3.20* 2.67*  CALCIUM 8.7 8.7 8.7 9.3  --  8.3*   Liver Function Tests: No results found for this basename: AST, ALT, ALKPHOS, BILITOT, PROT, ALBUMIN,  in the last 168 hours No results found for this basename: LIPASE, AMYLASE,  in the last 168 hours No results found for this basename: AMMONIA,  in the last 168 hours CBC:  Recent Labs Lab 08/05/13 0415 08/06/13 0610 08/07/13 0500 08/10/13 1044 08/10/13 1334 08/11/13 0510  WBC 8.5 7.9 7.1 7.5  --  7.8  HGB 8.7* 9.2* 9.0* 8.9* 9.5* 6.8*  HCT 28.7*  30.1* 30.1* 30.6* 28.0* 23.1*  MCV 75.1* 75.8* 77.0* 76.7*  --  76.2*  PLT 317 347 324 311  --  281   Cardiac Enzymes: No results found for this basename: CKTOTAL, CKMB, CKMBINDEX, TROPONINI,  in the last 168 hours BNP (last 3 results)  Recent Labs  02/15/13 0434 07/05/13 1536 08/03/13 1122  PROBNP 438.6* 1581.0* 1789.0*   CBG:  Recent Labs Lab 08/08/13 0607 08/08/13 1118 08/10/13 1638 08/10/13 1708 08/10/13 2200  GLUCAP 108* 89 59* 93 100*    Recent Results (from the past 240 hour(s))  MRSA PCR SCREENING     Status: Abnormal   Collection Time    08/03/13  4:56 PM      Result Value Range Status   MRSA by PCR POSITIVE (*) NEGATIVE  Final   Comment:            The GeneXpert MRSA Assay (FDA     approved for NASAL specimens     only), is one component of a     comprehensive MRSA colonization     surveillance program. It is not     intended to diagnose MRSA     infection nor to guide or     monitor treatment for     MRSA infections.     RESULT CALLED TO, READ BACK BY AND VERIFIED WITH:     Nancy Sawyer RN 18:45 08/03/13 ([email or web address])  MRSA PCR SCREENING     Status: None   Collection Time    08/10/13  3:38 PM      Result Value Range Status   MRSA by PCR NEGATIVE  NEGATIVE Final   Comment:            The GeneXpert MRSA Assay (FDA     approved for NASAL specimens     only), is one component of a     comprehensive MRSA colonization     surveillance program. It is not     intended to diagnose MRSA     infection nor to guide or     monitor treatment for     MRSA infections.     DELTA CHECK NOTED     Studies: No results found.  Scheduled Meds: . allopurinol  100 mg Oral Daily  . amiodarone  200 mg Oral Daily  . amLODipine  10 mg Oral Daily  . atorvastatin  20 mg Oral QHS  . docusate sodium  100 mg Oral BID  . feeding supplement  30 mL Oral BID  . feeding supplement  1 Container Oral BID BM  . hydrALAZINE  50 mg Oral TID  . insulin aspart  0-15 Units Subcutaneous TID WC  . insulin glargine  20 Units Subcutaneous BID  . levofloxacin  250 mg Oral Daily  . metoprolol tartrate  25 mg Oral BID  . pantoprazole  40 mg Oral Daily  . sodium chloride  3 mL Intravenous Q12H   Continuous Infusions:   Principal Problem:   Bright red rectal bleeding Active Problems:   HYPERLIPIDEMIA   OBSTRUCTIVE SLEEP APNEA   HYPERTENSION   Acute combined systolic and diastolic heart failure   C V A / STROKE   ASTHMA   Obesity hypoventilation syndrome   Prosthetic joint infection   DM (diabetes mellitus)   Chronic anticoagulation   Chronic venous insufficiency   CKD (chronic kidney disease), stage IV   Anemia of chronic  disease   PAF (paroxysmal atrial fibrillation)   Morbid obesity  Bleeding into the skin   Hyperkalemia   Acute-on-chronic kidney injury    Time spent:    [name]  [facility] Pager [phone]. If 7PM-7AM, please contact night-coverage at [email or web address], password [facility] 08/11/2013, 10:22 AM  LOS: 1 day

## 2013-08-11 NOTE — Progress Notes (Signed)
1st unit of FFP started per orders. No s/s of rxn. Patient tolerating well. Placed patient on bedpan. + green liquid BM noted. Patient cleaned and linens changed. Abdominal dressings saturated; changed. Xeroform and ABD placed to sites. Husband at bedside. Will continue to monitor.[name]

## 2013-08-11 NOTE — Progress Notes (Signed)
Chaplain responded to [facility] consult; pt requested prayer. Nurse spoke of pt as "very spiritual" and said she lives with her husband. Pt was resting when I entered but was eager to visit with me. Appeared to be uncomfortable but stated that she felt "much better" than when she entered. Expressed worry and fear about cause of bleeding.  Requested that I pray that her healthcare team will discover the source and cause and that she will recover. Provided prayer and spiritual support. Pt was very appreciative of my presence.   08/11/13 1000  Clinical Encounter Type  Visited With Patient;Health care provider  Visit Type Initial;Spiritual support  Referral From Nurse  Spiritual Encounters  Spiritual Needs Prayer;Emotional  Stress Factors  Patient Stress Factors Health changes;Lack of knowledge  Maurene Capes, Chaplain Resident

## 2013-08-11 NOTE — Progress Notes (Signed)
1unit PRBCs transfusing. No s/s of reaction. Patient tolerating well. Will continue to monitor. Call bell near. Mamie Levers

## 2013-08-11 NOTE — Progress Notes (Signed)
2nd unit of PRBC started. Patient ID confirmed x2RN. No distress. Will continue to monitor.[name]

## 2013-08-12 DIAGNOSIS — I4891 Unspecified atrial fibrillation: Secondary | ICD-10-CM

## 2013-08-12 DIAGNOSIS — K625 Hemorrhage of anus and rectum: Secondary | ICD-10-CM

## 2013-08-12 LAB — CBC WITH DIFFERENTIAL/PLATELET
Basophils Relative: 0 % (ref 0–1)
Eosinophils Relative: 4 % (ref 0–5)
Hemoglobin: 8.7 g/dL — ABNORMAL LOW (ref 12.0–15.0)
Lymphocytes Relative: 12 % (ref 12–46)
Monocytes Relative: 6 % (ref 3–12)
Neutrophils Relative %: 78 % — ABNORMAL HIGH (ref 43–77)
Platelets: 263 10*3/uL (ref 150–400)
RBC: 3.55 MIL/uL — ABNORMAL LOW (ref 3.87–5.11)
WBC: 7.6 10*3/uL (ref 4.0–10.5)

## 2013-08-12 LAB — PREPARE FRESH FROZEN PLASMA: Unit division: 0

## 2013-08-12 LAB — TYPE AND SCREEN
ABO/RH(D): A POS
Unit division: 0

## 2013-08-12 LAB — BASIC METABOLIC PANEL
Calcium: 8.6 mg/dL (ref 8.4–10.5)
GFR calc Af Amer: 23 mL/min — ABNORMAL LOW (ref >90)
GFR calc non Af Amer: 20 mL/min — ABNORMAL LOW (ref >90)
Potassium: 5.2 mEq/L — ABNORMAL HIGH (ref 3.5–5.1)
Sodium: 139 mEq/L (ref 135–145)

## 2013-08-12 LAB — GLUCOSE, CAPILLARY
Glucose-Capillary: 86 mg/dL (ref 70–99)
Glucose-Capillary: 152 mg/dL — ABNORMAL HIGH (ref 70–99)

## 2013-08-12 MED ORDER — INSULIN GLARGINE 100 UNIT/ML ~~LOC~~ SOLN
10.0000 [IU] | Freq: Two times a day (BID) | SUBCUTANEOUS | Status: DC
  Administered 2013-08-12 – 2013-08-19 (×13): 10 [IU] via SUBCUTANEOUS
  Filled 2013-08-12 (×16): qty 0.1

## 2013-08-12 NOTE — Progress Notes (Signed)
08/12/2013 6:37 PM Nursing note Pt. Had loose BM with small amount of bright red blood/blood clots contained in it. Dr. Rhona Leavens paged and made aware. No new orders at this time. Will continue to closely monitor patient.  Que Meneely, Blanchard Kelch

## 2013-08-12 NOTE — Progress Notes (Signed)
Inpatient Diabetes Program Recommendations  AACE/ADA: New Consensus Statement on Inpatient Glycemic Control (2013)  Target Ranges:  Prepandial:   less than 140 mg/dL      Peak postprandial:   less than 180 mg/dL (1-2 hours)      Critically ill patients:  140 - 180 mg/dL  Results for SARRAH, FIORENZA (MRN [id number]) as of 08/12/2013 10:57  Ref. Range 08/11/2013 11:14 08/11/2013 16:45 08/11/2013 19:03 08/11/2013 21:50 08/12/2013 06:24  Glucose-Capillary Latest Range: 70-99 mg/dL 79 62 (L) 469 (H) 85 86   Inpatient Diabetes Program Recommendations Insulin - Basal: Recommend decrease Lantus to 1/2 home dose 20 units  Thank you  Piedad Climes BSN, RN,CDE Inpatient Diabetes Coordinator [phone] (team pager)

## 2013-08-12 NOTE — Progress Notes (Signed)
[facility] PROGRESS NOTE  [name] WUJ:[id number] DOB: 26-Nov-1951 DOA: 08/10/2013 PCP: [name] [name], MD  Assessment/Plan: Bright red rectal bleeding  - Overnight stable - s/p 2 units prbc's with good improvement in hgb - GI consulted and following plans for colon either later this week or as out pt Bleeding into the skin  - continue superficial dressing for skin oozing.  - s/p FFP with INR <2 today AKI on CKD (chronic kidney disease), stage IV  - Baseline creatinine appears to be around 2.5. Creatinine of 2.2 on admission.  - Renal ultrasound with R medical renal disease w/ atrophy. - Will cont to hold Lasix for now.  Anemia of chronic disease  - Baseline hemoglobin around 8. PAF (paroxysmal atrial fibrillation)  - Rate controlled at this time. Normally on Coumadin and metoprolol. Follows with Dr. Sharyn Lull . - Holding Coumadin secondary to bleedign.  - Would follow up with Dr Sharyn Lull upon discharge.  Hyperkalemia  - Hold aldactone. Monitor in tele.  OBSTRUCTIVE SLEEP APNEA  - Continue bedtime CPAP - refused last night  HYPERTENSION  Resume amlodipine and metoprolol.  Acute combined systolic and diastolic heart failure  EF of 95%. Does not appear portable noted clinically.  Resume Toprol.  Prosthetic joint infection  Follows with orthopedics Dr. Magnus Ivan and ID ( Dr Zenaida Niece dam) . Is on levaquin for suppressive abx.  DM (diabetes mellitus)  [name] FSG and continue SSI. Will reduce dose of lantus as patient is NPO  Code Status: Full Family Communication:  Pt in room (indicate person spoken with, relationship, and if by phone, the number) Disposition Plan: Pending  Consultants:  GI  Antibiotics:  Chronic Levaquin 08/10/13>>>  HPI/Subjective: No major complaints  Objective: Filed Vitals:   08/11/13 2045 08/11/13 2213 08/12/13 0438 08/12/13 1040  BP: 124/31 138/42 150/57 165/50  Pulse: 64 67 52 58  Temp: 97.7 F (36.5 C) 97.8 F (36.6 C) 97.4 F (36.3 C)    TempSrc: Oral Oral Oral   Resp:   18   Height:      Weight:      SpO2: 100% 100% 98%     Intake/Output Summary (Last 24 hours) at 08/12/13 1325 Last data filed at 08/12/13 0745  Gross per 24 hour  Intake   1557 ml  Output   1750 ml  Net   -193 ml   Filed Weights   08/10/13 1435  Weight: 120.657 kg (266 lb)    Exam:   General:  Awake, in nad  Cardiovascular: Regular, s1, s2  Respiratory: normal resp effort, no wheezing  Abdomen: sort, nondistended  Musculoskeletal: perfused, no clubbing   Data Reviewed: Basic Metabolic Panel:  Recent Labs Lab 08/06/13 0610 08/07/13 0500 08/10/13 1044 08/10/13 1334 08/11/13 0510  NA 140 140 140 141 144  K 4.8 4.6 5.6* 5.3* 5.6*  CL 103 104 102 108 107  CO2 24 26 29   --  29  GLUCOSE 66* 71 74 63* 86  BUN 38* 43* 59* 50* 51*  CREATININE 2.70* 2.56* 2.90* 3.20* 2.67*  CALCIUM 8.7 8.7 9.3  --  8.3*   Liver Function Tests: No results found for this basename: AST, ALT, ALKPHOS, BILITOT, PROT, ALBUMIN,  in the last 168 hours No results found for this basename: LIPASE, AMYLASE,  in the last 168 hours No results found for this basename: AMMONIA,  in the last 168 hours CBC:  Recent Labs Lab 08/07/13 0500 08/10/13 1044 08/10/13 1334 08/11/13 0510 08/11/13 1604 08/12/13 0555  WBC 7.1 7.5  --  7.8 8.0 7.6  NEUTROABS  --   --   --   --   --  5.9  HGB 9.0* 8.9* 9.5* 6.8* 7.5* 8.7*  HCT 30.1* 30.6* 28.0* 23.1* 24.3* 28.2*  MCV 77.0* 76.7*  --  76.2* 78.1 79.4  PLT 324 311  --  281 266 263   Cardiac Enzymes: No results found for this basename: CKTOTAL, CKMB, CKMBINDEX, TROPONINI,  in the last 168 hours BNP (last 3 results)  Recent Labs  02/15/13 0434 07/05/13 1536 08/03/13 1122  PROBNP 438.6* 1581.0* 1789.0*   CBG:  Recent Labs Lab 08/11/13 1645 08/11/13 1903 08/11/13 2150 08/12/13 0624 08/12/13 1131  GLUCAP 62* 101* 85 86 152*    Recent Results (from the past 240 hour(s))  MRSA PCR SCREENING     Status:  Abnormal   Collection Time    08/03/13  4:56 PM      Result Value Range Status   MRSA by PCR POSITIVE (*) NEGATIVE Final   Comment:            The GeneXpert MRSA Assay (FDA     approved for NASAL specimens     only), is one component of a     comprehensive MRSA colonization     surveillance program. It is not     intended to diagnose MRSA     infection nor to guide or     monitor treatment for     MRSA infections.     RESULT CALLED TO, READ BACK BY AND VERIFIED WITH:     Dorthey Sawyer RN 18:45 08/03/13 ([email or web address])  MRSA PCR SCREENING     Status: None   Collection Time    08/10/13  3:38 PM      Result Value Range Status   MRSA by PCR NEGATIVE  NEGATIVE Final   Comment:            The GeneXpert MRSA Assay (FDA     approved for NASAL specimens     only), is one component of a     comprehensive MRSA colonization     surveillance program. It is not     intended to diagnose MRSA     infection nor to guide or     monitor treatment for     MRSA infections.     DELTA CHECK NOTED     Studies: US Renal  08/12/2013   *RADIOLOGY REPORT*  Clinical Data: Acute kidney injury.  RENAL/URINARY TRACT ULTRASOUND COMPLETE  Comparison:  01/23/2012.  Findings:  Right Kidney:  9 cm in length.  No hydronephrosis.  Diffuse increased renal echogenicity.  There are two dominant cysts, also seen previously, located in the interpolar and lower pole regions. The largest is in the lower pole, 1.7 cm in diameter.  Left Kidney:  11 cm in length.  No hydronephrosis.  Renal echogenicity more normal in appearance.  No focal abnormality.  Bladder:  98 ml pre void.  No focal wall thickening or filling defect.  IMPRESSION:  1.  No hydronephrosis. 2.  Medical renal disease on the right, with atrophy.   Original Report Authenticated By: Tiburcio Pea    Scheduled Meds: . allopurinol  100 mg Oral Daily  . amiodarone  200 mg Oral Daily  . amLODipine  10 mg Oral Daily  . atorvastatin  20 mg Oral QHS  . docusate sodium  100  mg Oral BID  . feeding supplement  30 mL  Oral BID  . feeding supplement  1 Container Oral BID BM  . hydrALAZINE  50 mg Oral TID  . insulin aspart  0-15 Units Subcutaneous TID WC  . insulin glargine  10 Units Subcutaneous BID  . levofloxacin  250 mg Oral Daily  . metoprolol tartrate  25 mg Oral BID  . pantoprazole  40 mg Oral Daily  . sodium chloride  3 mL Intravenous Q12H   Continuous Infusions:   Principal Problem:   Bright red rectal bleeding Active Problems:   HYPERLIPIDEMIA   OBSTRUCTIVE SLEEP APNEA   HYPERTENSION   Acute combined systolic and diastolic heart failure   C V A / STROKE   ASTHMA   Obesity hypoventilation syndrome   Prosthetic joint infection   DM (diabetes mellitus)   Chronic anticoagulation   Chronic venous insufficiency   CKD (chronic kidney disease), stage IV   Anemia of chronic disease   PAF (paroxysmal atrial fibrillation)   Morbid obesity   Bleeding into the skin   Hyperkalemia   Acute-on-chronic kidney injury    Time spent:    CHIU, STEPHEN K  [facility] Pager [phone]. If 7PM-7AM, please contact night-coverage at [email or web address], password [facility] 08/12/2013, 1:25 PM  LOS: 2 days

## 2013-08-12 NOTE — Progress Notes (Signed)
08/12/2013 0900 Nursing note Dr. Rhona Leavens paged and made aware pt HR SB 50-60. Pt. Asymptomatic. Verbal orders received to hold Amiodarone and Metoprolol for HR less than 60. Orders updated with parameters in EPIC.  Norbert Malkin, Blanchard Kelch

## 2013-08-12 NOTE — Progress Notes (Signed)
08/12/2013 1100 Nursing note Dr. Rhona Leavens on floor and made aware of Diabetes Coordinator recommendations for decreased dose of Lantus. AM dose of Lantus held until evaluated by MD.  Cleon Gustin

## 2013-08-12 NOTE — Progress Notes (Signed)
Patient refuses CPAP at bedtime.  Pt told to notify RT if she changes her mind.  Pt currently on nasal cannula.

## 2013-08-13 LAB — CBC WITH DIFFERENTIAL/PLATELET
Basophils Absolute: 0 10*3/uL (ref 0.0–0.1)
Basophils Relative: 0 % (ref 0–1)
Eosinophils Absolute: 0.2 10*3/uL (ref 0.0–0.7)
Eosinophils Relative: 3 % (ref 0–5)
HCT: 28.8 % — ABNORMAL LOW (ref 36.0–46.0)
Hemoglobin: 8.7 g/dL — ABNORMAL LOW (ref 12.0–15.0)
Lymphocytes Relative: 14 % (ref 12–46)
Lymphs Abs: 1 10*3/uL (ref 0.7–4.0)
MCH: 24.4 pg — ABNORMAL LOW (ref 26.0–34.0)
MCHC: 30.2 g/dL (ref 30.0–36.0)
MCV: 80.7 fL (ref 78.0–100.0)
Monocytes Absolute: 0.4 10*3/uL (ref 0.1–1.0)
Monocytes Relative: 5 % (ref 3–12)
Neutro Abs: 5.8 10*3/uL (ref 1.7–7.7)
Neutrophils Relative %: 78 % — ABNORMAL HIGH (ref 43–77)
Platelets: 250 10*3/uL (ref 150–400)
RBC: 3.57 MIL/uL — ABNORMAL LOW (ref 3.87–5.11)
RDW: 22.1 % — ABNORMAL HIGH (ref 11.5–15.5)
WBC: 7.4 10*3/uL (ref 4.0–10.5)

## 2013-08-13 LAB — GLUCOSE, CAPILLARY
Glucose-Capillary: 108 mg/dL — ABNORMAL HIGH (ref 70–99)
Glucose-Capillary: 85 mg/dL (ref 70–99)
Glucose-Capillary: 86 mg/dL (ref 70–99)
Glucose-Capillary: 113 mg/dL — ABNORMAL HIGH (ref 70–99)

## 2013-08-13 LAB — BASIC METABOLIC PANEL
BUN: 38 mg/dL — ABNORMAL HIGH (ref 6–23)
Calcium: 8.9 mg/dL (ref 8.4–10.5)
GFR calc Af Amer: 21 mL/min — ABNORMAL LOW (ref >90)
GFR calc non Af Amer: 18 mL/min — ABNORMAL LOW (ref >90)
Glucose, Bld: 99 mg/dL (ref 70–99)
Potassium: 5.3 mEq/L — ABNORMAL HIGH (ref 3.5–5.1)
Sodium: 139 mEq/L (ref 135–145)

## 2013-08-13 LAB — CBC
MCH: 24.5 pg — ABNORMAL LOW (ref 26.0–34.0)
MCHC: 30.4 g/dL (ref 30.0–36.0)
Platelets: 265 10*3/uL (ref 150–400)
RDW: 22.2 % — ABNORMAL HIGH (ref 11.5–15.5)

## 2013-08-13 LAB — PROTIME-INR
INR: 2 — ABNORMAL HIGH (ref 0.00–1.49)
Prothrombin Time: 22.1 seconds — ABNORMAL HIGH (ref 11.6–15.2)

## 2013-08-13 NOTE — Progress Notes (Signed)
[facility] PROGRESS NOTE  [name] QMV:[id number] DOB: 04-26-1951 DOA: 08/10/2013 PCP: [name] [name], MD  Assessment/Plan: Bright red rectal bleeding  - Overnight stable - s/p 2 units prbc's with improvement in hgb - GI consulted with initial plans for colon either later this week or as out pt - Overnight events noted - pt noted to have BRBPR with clots Bleeding into the skin  - skin oozing over abd improved - s/p FFP AKI on CKD (chronic kidney disease), stage IV  - Baseline creatinine appears to be around 2.5. Creatinine remains near baseline.  - Renal ultrasound with R medical renal disease w/ atrophy. - Will cont to hold Lasix for now.  Anemia of chronic disease  - Baseline hemoglobin around 8. PAF (paroxysmal atrial fibrillation)  - Rate controlled at this time. Normally on Coumadin and metoprolol. Follows with Dr. Sharyn Lull . - Holding Coumadin secondary to bleeding.  - Would follow up with Dr Sharyn Lull upon discharge.  Hyperkalemia  - Hold aldactone. Monitor in tele.  OBSTRUCTIVE SLEEP APNEA  - Continue bedtime CPAP - cont to refuse  HYPERTENSION  Resume amlodipine and metoprolol.  Acute combined systolic and diastolic heart failure  EF of 28%. Does not appear portable noted clinically.  Resume Toprol.  Prosthetic joint infection  Follows with orthopedics Dr. Magnus Ivan and ID ( Dr Zenaida Niece dam) . Is on levaquin for suppressive abx.  DM (diabetes mellitus)  [name] FSG and continue SSI. Will reduce dose of lantus as patient is NPO  Code Status: Full Family Communication:  Pt in room (indicate person spoken with, relationship, and if by phone, the number) Disposition Plan: Pending  Consultants:  GI  Antibiotics:  Chronic Levaquin 08/10/13>>>  HPI/Subjective: No major complaints  Objective: Filed Vitals:   08/12/13 1644 08/12/13 2057 08/13/13 0310 08/13/13 1007  BP: 158/47 151/89 151/59   Pulse:  62 55 56  Temp:  98.4 F (36.9 C) 97.7 F (36.5 C)    TempSrc:  Oral Oral   Resp:  18 18   Height:      Weight:      SpO2:  100% 98%     Intake/Output Summary (Last 24 hours) at 08/13/13 1153 Last data filed at 08/13/13 0850  Gross per 24 hour  Intake   1200 ml  Output   1350 ml  Net   -150 ml   Filed Weights   08/10/13 1435  Weight: 120.657 kg (266 lb)    Exam:   General:  Awake, in nad  Cardiovascular: Regular, s1, s2  Respiratory: normal resp effort, no wheezing  Abdomen: sort, nondistended  Musculoskeletal: perfused, no clubbing   Data Reviewed: Basic Metabolic Panel:  Recent Labs Lab 08/07/13 0500 08/10/13 1044 08/10/13 1334 08/11/13 0510 08/12/13 1415 08/13/13 0630  NA 140 140 141 144 139 139  K 4.6 5.6* 5.3* 5.6* 5.2* 5.3*  CL 104 102 108 107 103 104  CO2 26 29  --  29 28 28   GLUCOSE 71 74 63* 86 81 99  BUN 43* 59* 50* 51* 41* 38*  CREATININE 2.56* 2.90* 3.20* 2.67* 2.48* 2.61*  CALCIUM 8.7 9.3  --  8.3* 8.6 8.9   Liver Function Tests: No results found for this basename: AST, ALT, ALKPHOS, BILITOT, PROT, ALBUMIN,  in the last 168 hours No results found for this basename: LIPASE, AMYLASE,  in the last 168 hours No results found for this basename: AMMONIA,  in the last 168 hours CBC:  Recent Labs Lab 08/10/13 1044  08/10/13 1334 08/11/13 0510 08/11/13 1604 08/12/13 0555 08/13/13 0630  WBC 7.5  --  7.8 8.0 7.6 7.4  NEUTROABS  --   --   --   --  5.9 5.8  HGB 8.9* 9.5* 6.8* 7.5* 8.7* 8.7*  HCT 30.6* 28.0* 23.1* 24.3* 28.2* 28.8*  MCV 76.7*  --  76.2* 78.1 79.4 80.7  PLT 311  --  281 266 263 250   Cardiac Enzymes: No results found for this basename: CKTOTAL, CKMB, CKMBINDEX, TROPONINI,  in the last 168 hours BNP (last 3 results)  Recent Labs  02/15/13 0434 07/05/13 1536 08/03/13 1122  PROBNP 438.6* 1581.0* 1789.0*   CBG:  Recent Labs Lab 08/12/13 1637 08/12/13 2052 08/13/13 0304 08/13/13 0603 08/13/13 1134  GLUCAP 72 104* 108* 85 86    Recent Results (from the past 240  hour(s))  MRSA PCR SCREENING     Status: Abnormal   Collection Time    08/03/13  4:56 PM      Result Value Range Status   MRSA by PCR POSITIVE (*) NEGATIVE Final   Comment:            The GeneXpert MRSA Assay (FDA     approved for NASAL specimens     only), is one component of a     comprehensive MRSA colonization     surveillance program. It is not     intended to diagnose MRSA     infection nor to guide or     monitor treatment for     MRSA infections.     RESULT CALLED TO, READ BACK BY AND VERIFIED WITH:     Dorthey Sawyer RN 18:45 08/03/13 ([email or web address])  MRSA PCR SCREENING     Status: None   Collection Time    08/10/13  3:38 PM      Result Value Range Status   MRSA by PCR NEGATIVE  NEGATIVE Final   Comment:            The GeneXpert MRSA Assay (FDA     approved for NASAL specimens     only), is one component of a     comprehensive MRSA colonization     surveillance program. It is not     intended to diagnose MRSA     infection nor to guide or     monitor treatment for     MRSA infections.     DELTA CHECK NOTED     Studies: US Renal  08/12/2013   *RADIOLOGY REPORT*  Clinical Data: Acute kidney injury.  RENAL/URINARY TRACT ULTRASOUND COMPLETE  Comparison:  01/23/2012.  Findings:  Right Kidney:  9 cm in length.  No hydronephrosis.  Diffuse increased renal echogenicity.  There are two dominant cysts, also seen previously, located in the interpolar and lower pole regions. The largest is in the lower pole, 1.7 cm in diameter.  Left Kidney:  11 cm in length.  No hydronephrosis.  Renal echogenicity more normal in appearance.  No focal abnormality.  Bladder:  98 ml pre void.  No focal wall thickening or filling defect.  IMPRESSION:  1.  No hydronephrosis. 2.  Medical renal disease on the right, with atrophy.   Original Report Authenticated By: Tiburcio Pea    Scheduled Meds: . allopurinol  100 mg Oral Daily  . amiodarone  200 mg Oral Daily  . amLODipine  10 mg Oral Daily  . atorvastatin   20 mg Oral QHS  . docusate sodium  100 mg  Oral BID  . feeding supplement  30 mL Oral BID  . feeding supplement  1 Container Oral BID BM  . hydrALAZINE  50 mg Oral TID  . insulin aspart  0-15 Units Subcutaneous TID WC  . insulin glargine  10 Units Subcutaneous BID  . levofloxacin  250 mg Oral Daily  . metoprolol tartrate  25 mg Oral BID  . pantoprazole  40 mg Oral Daily  . sodium chloride  3 mL Intravenous Q12H   Continuous Infusions:   Principal Problem:   Bright red rectal bleeding Active Problems:   HYPERLIPIDEMIA   OBSTRUCTIVE SLEEP APNEA   HYPERTENSION   Acute combined systolic and diastolic heart failure   C V A / STROKE   ASTHMA   Obesity hypoventilation syndrome   Prosthetic joint infection   DM (diabetes mellitus)   Chronic anticoagulation   Chronic venous insufficiency   CKD (chronic kidney disease), stage IV   Anemia of chronic disease   PAF (paroxysmal atrial fibrillation)   Morbid obesity   Bleeding into the skin   Hyperkalemia   Acute-on-chronic kidney injury    Time spent:    Marek Nghiem K  [facility] Pager [phone]. If 7PM-7AM, please contact night-coverage at [email or web address], password [facility] 08/13/2013, 11:53 AM  LOS: 3 days

## 2013-08-13 NOTE — Progress Notes (Signed)
Pt refuses CPAP qHS. 

## 2013-08-14 DIAGNOSIS — I5042 Chronic combined systolic (congestive) and diastolic (congestive) heart failure: Secondary | ICD-10-CM

## 2013-08-14 DIAGNOSIS — N184 Chronic kidney disease, stage 4 (severe): Secondary | ICD-10-CM

## 2013-08-14 DIAGNOSIS — I509 Heart failure, unspecified: Secondary | ICD-10-CM

## 2013-08-14 DIAGNOSIS — I10 Essential (primary) hypertension: Secondary | ICD-10-CM

## 2013-08-14 LAB — GLUCOSE, CAPILLARY
Glucose-Capillary: 109 mg/dL — ABNORMAL HIGH (ref 70–99)
Glucose-Capillary: 129 mg/dL — ABNORMAL HIGH (ref 70–99)

## 2013-08-14 MED ORDER — DIPHENHYDRAMINE-ZINC ACETATE 2-0.1 % EX CREA
TOPICAL_CREAM | Freq: Three times a day (TID) | CUTANEOUS | Status: DC | PRN
  Administered 2013-08-14 – 2013-08-18 (×5): via TOPICAL
  Filled 2013-08-14 (×2): qty 28

## 2013-08-14 NOTE — Evaluation (Signed)
Physical Therapy Evaluation Patient Details Name: [name] MRN: [id number] DOB: 12-08-1951 Today's Date: 08/14/2013 Time: 1308-6578 PT Time Calculation (min): 21 min  PT Assessment / Plan / Recommendation History of Present Illness    PT ADM ON 08/10/13 WITH ANEMIA, BLEEDING INTO THE SKIN.    Clinical Impression  Patient presents with significant deficits in functional mobility as indicated below. Patient initially cooperative but became confused and agitated with this therapist as I encouraged transfer from commode to chair and stated that patient could not return to soiled bedside. Pt began stating that she is unhappy because no one is doing what she wants to do.  Explained to patient concerns for safety at current functional level (requiring +2 assist). Patient adamantly opposing follow up SNF, but is receptive to HHPT. Currently, do not feel patient is safe to go home based on level of function and rec SNF, however, if patient continues to refuse, will need HHPT and family assistance. Will continue to follow as indicated and will attempt follow up from a different therapist to see if patient becomes more receptive.    PT Assessment  Patient needs continued PT services    Follow Up Recommendations  Home health PT;Supervision/Assistance - 24 hour (needs SNF but refusing)          Equipment Recommendations  None recommended by PT    Recommendations for Other Services OT consult   Frequency Min 3X/week    Precautions / Restrictions Precautions Precautions: Fall Restrictions Weight Bearing Restrictions: No   Pertinent Vitals/Pain 7/10 LE pain with weight bearing      Mobility  Bed Mobility Bed Mobility: Supine to Sit;Sitting - Scoot to Edge of Bed Left Sidelying to Sit: 1: +2 Total assist;HOB elevated Left Sidelying to Sit: Patient Percentage: 60% Sitting - Scoot to Edge of Bed: 4: Min assist Details for Bed Mobility Assistance: Assist to elevate shoulders/trunk to  sitting upright & to move LLE to EOB.   Transfers Transfers: Sit to Stand;Stand to Sit Sit to Stand: 1: +2 Total assist;With upper extremity assist;From bed Sit to Stand: Patient Percentage: 70% Stand to Sit: 3: Mod assist Stand Pivot Transfers:  (could not perform SPT, needed to bring commode up to patient) Details for Transfer Assistance: uses momentum, verbal cues for position of body to sitting surface Ambulation/Gait Ambulation/Gait Assistance: Not tested (comment) (unsafe to perform (increased LE pain)) Wheelchair Mobility Wheelchair Mobility: No        PT Diagnosis: Difficulty walking;Generalized weakness  PT Problem List: Decreased strength;Decreased activity tolerance;Cardiopulmonary status limiting activity PT Treatment Interventions: DME instruction;Gait training;Functional mobility training;Therapeutic activities;Therapeutic exercise     PT Goals(Current goals can be found in the care plan section) Acute Rehab PT Goals Patient Stated Goal: return home PT Goal Formulation: With patient Time For Goal Achievement: 08/11/13 Potential to Achieve Goals: Fair  Visit Information  Last PT Received On: 08/14/13 Assistance Needed: +2       Prior Functioning  Home Living Family/patient expects to be discharged to:: Private residence Living Arrangements: Spouse/significant other;Children Available Help at Discharge: Family;Available 24 hours/day Type of Home: House Home Access: Ramped entrance Home Layout: One level Home Equipment: Walker - 2 wheels Additional Comments: pt on 2LO2 at home during the night and during the day as needed Prior Function Level of Independence: Independent with assistive device(s);Needs assistance Gait / Transfers Assistance Needed: uses RW Comments: uses electric cart for grocery shopping Communication Communication: No difficulties Dominant Hand: Left    Cognition  Cognition Arousal/Alertness:  Awake/alert Behavior During Therapy: WFL  for tasks assessed/performed Overall Cognitive Status: No family/caregiver present to determine baseline cognitive functioning Area of Impairment: Safety/judgement;Awareness Safety/Judgement: Decreased awareness of safety;Decreased awareness of deficits Awareness: Emergent General Comments: pt unable to recognize reasoning for which she could not return to sitting on soiled bed, pt agitated with therapy team, pt unwilling to recognize need for additional assist for varyious activities    Extremity/Trunk Assessment Upper Extremity Assessment Upper Extremity Assessment: Defer to OT evaluation Lower Extremity Assessment RLE Deficits / Details: generalized weakness LLE Deficits / Details: Limited by pain, body habitus and generalized weakness Cervical / Trunk Assessment Cervical / Trunk Assessment: Normal   Balance Balance Balance Assessed: Yes Static Sitting Balance Static Sitting - Balance Support: Feet supported Static Sitting - Level of Assistance: 5: Stand by assistance Static Standing Balance Static Standing - Balance Support: Bilateral upper extremity supported;During functional activity Static Standing - Level of Assistance: 5: Stand by assistance  End of Session PT - End of Session Equipment Utilized During Treatment: Oxygen (3L 02) Activity Tolerance: Patient tolerated treatment well;Patient limited by pain;Treatment limited secondary to agitation Patient left: in chair;with call bell/phone within reach Nurse Communication: Mobility status (needing hygiene and bathing)  GP     Fabio Asa 08/14/2013, 10:46 AM Charlotte Crumb, PT DPT  [phone]

## 2013-08-14 NOTE — Progress Notes (Signed)
[facility] PROGRESS NOTE  [name] AVW:[id number] DOB: Feb 08, 1951 DOA: 08/10/2013 PCP: Willey Blade, MD  Assessment/Plan: Bright red rectal bleeding  - Overnight stable - Hgb has remained stable s/p 2 units prbc's - Discussed with GI with tentative plans for colon Monday, with blood clots per rectum noted by staff on 08/12/13 Bleeding into the skin  - skin oozing over abd improved - s/p FFP AKI on CKD (chronic kidney disease), stage IV  - Baseline creatinine appears to be around 2.5. Creatinine has remained near baseline.  - Renal ultrasound with R medical renal disease w/ atrophy. - Will cont to hold Lasix for now.  Anemia of chronic disease  - Baseline hemoglobin around 8. PAF (paroxysmal atrial fibrillation)  - Rate controlled at this time. Normally on Coumadin and metoprolol. Follows with Dr. Sharyn Lull . - Holding Coumadin secondary to recent bleeding.  - Would follow up with Dr Sharyn Lull upon discharge.  Hyperkalemia  - Hold aldactone. Monitor in tele.  OBSTRUCTIVE SLEEP APNEA  - Recommended bedtime CPAP - cont to refuse  HYPERTENSION  - Resumed amlodipine and metoprolol.  - Stable Acute combined systolic and diastolic heart failure  EF of 82%. Does not appear in volume overload clinically.  Resumed Toprol.  Prosthetic joint infection  Follows with orthopedics Dr. Magnus Ivan and ID ( Dr Zenaida Niece dam) . Is on levaquin for suppressive abx.  DM (diabetes mellitus)  - Martha FSG and continue SSI. Will reduced dose of lantus (10units bid) as patient is NPO - Suspect pt is non-compliant with diabetic diet at home Itching: - Consider a trial of benadryl cream  Code Status: Full Family Communication:  Pt in room (indicate person spoken with, relationship, and if by phone, the number) Disposition Plan: Pending  Consultants:  GI  Antibiotics:  Chronic Levaquin 08/10/13>>>  HPI/Subjective: No major complaints  Objective: Filed Vitals:   08/13/13 1007 08/13/13 1345  08/13/13 2003 08/14/13 0640  BP:  170/58 165/50 145/48  Pulse: 56 61 65 61  Temp:  97.5 F (36.4 C) 98.4 F (36.9 C) 98.1 F (36.7 C)  TempSrc:  Oral Oral Oral  Resp:  17 20 20   Height:      Weight:      SpO2:  99% 100% 99%    Intake/Output Summary (Last 24 hours) at 08/14/13 1246 Last data filed at 08/14/13 0953  Gross per 24 hour  Intake    720 ml  Output   1151 ml  Net   -431 ml   Filed Weights   08/10/13 1435  Weight: 120.657 kg (266 lb)    Exam:   General:  Awake, in nad  Cardiovascular: Regular, s1, s2  Respiratory: normal resp effort, no wheezing  Abdomen: sort, nondistended  Musculoskeletal: perfused, no clubbing   Data Reviewed: Basic Metabolic Panel:  Recent Labs Lab 08/10/13 1044 08/10/13 1334 08/11/13 0510 08/12/13 1415 08/13/13 0630  NA 140 141 144 139 139  K 5.6* 5.3* 5.6* 5.2* 5.3*  CL 102 108 107 103 104  CO2 29  --  29 28 28   GLUCOSE 74 63* 86 81 99  BUN 59* 50* 51* 41* 38*  CREATININE 2.90* 3.20* 2.67* 2.48* 2.61*  CALCIUM 9.3  --  8.3* 8.6 8.9   Liver Function Tests: No results found for this basename: AST, ALT, ALKPHOS, BILITOT, PROT, ALBUMIN,  in the last 168 hours No results found for this basename: LIPASE, AMYLASE,  in the last 168 hours No results found for this basename: AMMONIA,  in the last 168 hours CBC:  Recent Labs Lab 08/11/13 0510 08/11/13 1604 08/12/13 0555 08/13/13 0630 08/13/13 1240  WBC 7.8 8.0 7.6 7.4 7.5  NEUTROABS  --   --  5.9 5.8  --   HGB 6.8* 7.5* 8.7* 8.7* 9.2*  HCT 23.1* 24.3* 28.2* 28.8* 30.3*  MCV 76.2* 78.1 79.4 80.7 80.6  PLT 281 266 263 250 265   Cardiac Enzymes: No results found for this basename: CKTOTAL, CKMB, CKMBINDEX, TROPONINI,  in the last 168 hours BNP (last 3 results)  Recent Labs  02/15/13 0434 07/05/13 1536 08/03/13 1122  PROBNP 438.6* 1581.0* 1789.0*   CBG:  Recent Labs Lab 08/13/13 1134 08/13/13 1637 08/13/13 2111 08/14/13 0635 08/14/13 1201  GLUCAP 86 119*  113* 93 109*    Recent Results (from the past 240 hour(s))  MRSA PCR SCREENING     Status: None   Collection Time    08/10/13  3:38 PM      Result Value Range Status   MRSA by PCR NEGATIVE  NEGATIVE Final   Comment:            The GeneXpert MRSA Assay (FDA     approved for NASAL specimens     only), is one component of a     comprehensive MRSA colonization     surveillance program. It is not     intended to diagnose MRSA     infection nor to guide or     monitor treatment for     MRSA infections.     DELTA CHECK NOTED     Studies: No results found.  Scheduled Meds: . allopurinol  100 mg Oral Daily  . amiodarone  200 mg Oral Daily  . amLODipine  10 mg Oral Daily  . atorvastatin  20 mg Oral QHS  . docusate sodium  100 mg Oral BID  . feeding supplement  30 mL Oral BID  . feeding supplement  1 Container Oral BID BM  . hydrALAZINE  50 mg Oral TID  . insulin aspart  0-15 Units Subcutaneous TID WC  . insulin glargine  10 Units Subcutaneous BID  . levofloxacin  250 mg Oral Daily  . metoprolol tartrate  25 mg Oral BID  . pantoprazole  40 mg Oral Daily  . 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 If 7PM-7AM, please contact night-coverage at [email or web address], password [facility] 08/14/2013, 12:46 PM  LOS: 4 days

## 2013-08-15 DIAGNOSIS — E662 Morbid (severe) obesity with alveolar hypoventilation: Secondary | ICD-10-CM

## 2013-08-15 LAB — CBC WITH DIFFERENTIAL/PLATELET
Basophils Absolute: 0 10*3/uL (ref 0.0–0.1)
Basophils Relative: 0 % (ref 0–1)
Eosinophils Relative: 3 % (ref 0–5)
Lymphocytes Relative: 15 % (ref 12–46)
MCHC: 30 g/dL (ref 30.0–36.0)
MCV: 81.2 fL (ref 78.0–100.0)
Platelets: 246 10*3/uL (ref 150–400)
RBC: 3.41 MIL/uL — ABNORMAL LOW (ref 3.87–5.11)
RDW: 23.3 % — ABNORMAL HIGH (ref 11.5–15.5)

## 2013-08-15 LAB — PROTIME-INR: Prothrombin Time: 17.1 seconds — ABNORMAL HIGH (ref 11.6–15.2)

## 2013-08-15 LAB — BASIC METABOLIC PANEL
CO2: 25 mEq/L (ref 19–32)
Calcium: 8.8 mg/dL (ref 8.4–10.5)
GFR calc Af Amer: 22 mL/min — ABNORMAL LOW (ref >90)
GFR calc non Af Amer: 19 mL/min — ABNORMAL LOW (ref >90)
Sodium: 140 mEq/L (ref 135–145)

## 2013-08-15 LAB — GLUCOSE, CAPILLARY: Glucose-Capillary: 88 mg/dL (ref 70–99)

## 2013-08-15 MED ORDER — SODIUM CHLORIDE 0.9 % IV SOLN
INTRAVENOUS | Status: DC
  Administered 2013-08-16: 08:00:00 via INTRAVENOUS

## 2013-08-15 MED ORDER — PEG 3350-KCL-NA BICARB-NACL 420 G PO SOLR
4000.0000 mL | Freq: Once | ORAL | Status: AC
  Administered 2013-08-15: 4000 mL via ORAL
  Filled 2013-08-15: qty 4000

## 2013-08-15 NOTE — Progress Notes (Signed)
[facility] PROGRESS NOTE  [name] ZHY:[id number] DOB: 1951/07/25 DOA: 08/10/2013 PCP: Willey Blade, MD  Assessment/Plan: Bright red rectal bleeding  - Overnight stable - Hgb has remained stable at around 8.5 s/p 2 units prbc's (from 6.8 on admit) - Discussed with GI with tentative plans for colon Monday, with blood clots per rectum noted by staff on 08/12/13 - Will cont on clear liquid diet for now if endoscopy tomorrow Bleeding into the skin  - skin oozing over abd much improved - s/p FFP AKI on CKD (chronic kidney disease), stage IV  - Baseline creatinine appears to be around 2.5. Creatinine has remained near baseline.  - Renal ultrasound with R medical renal disease w/ atrophy. - Will cont to hold Lasix for now. - No signs of volume overload Anemia of chronic disease  - Baseline hemoglobin around 8. PAF (paroxysmal atrial fibrillation)  - Rate controlled at this time. Normally on Coumadin and metoprolol. Follows with Dr. Sharyn Lull . - Holding Coumadin secondary to recent bleeding.  - Would follow up with Dr Sharyn Lull upon discharge.  Hyperkalemia  - Hold aldactone. Monitor in tele.  OBSTRUCTIVE SLEEP APNEA  - Had recommended bedtime CPAP - cont to refuse  HYPERTENSION  - Resumed amlodipine and metoprolol.  - Stable Acute combined systolic and diastolic heart failure  EF of 29%. Does not appear in volume overload clinically.  Resumed Toprol.  Prosthetic joint infection  Follows with orthopedics Dr. Magnus Ivan and ID ( Dr Zenaida Niece dam) . Is on levaquin for suppressive abx.  DM (diabetes mellitus)  - [name] and continue SSI. Will reduced dose of lantus (10units bid) - Suspect pt is non-compliant with diabetic diet at home Itching: - Consider a trial of benadryl cream  Code Status: Full Family Communication:  Pt in room (indicate person spoken with, relationship, and if by phone, the number) Disposition Plan: Pending  Consultants:  GI  Antibiotics:  Chronic  Levaquin 08/10/13>>>  HPI/Subjective: No major complaints  Objective: Filed Vitals:   08/14/13 0640 08/14/13 1428 08/14/13 2004 08/15/13 0505  BP: 145/48 163/46 155/43 152/55  Pulse: 61 53 58 57  Temp: 98.1 F (36.7 C) 97.6 F (36.4 C) 98.2 F (36.8 C) 97.7 F (36.5 C)  TempSrc: Oral Oral Oral Oral  Resp: 20 20 20 18   Height:      Weight:      SpO2: 99% 98% 97% 98%    Intake/Output Summary (Last 24 hours) at 08/15/13 0826 Last data filed at 08/15/13 0615  Gross per 24 hour  Intake    460 ml  Output   1000 ml  Net   -540 ml   Filed Weights   08/10/13 1435  Weight: 120.657 kg (266 lb)    Exam:   General:  Awake, in nad  Cardiovascular: Regular, s1, s2  Respiratory: normal resp effort, no wheezing  Abdomen: sort, nondistended  Musculoskeletal: perfused, no clubbing   Data Reviewed: Basic Metabolic Panel:  Recent Labs Lab 08/10/13 1044 08/10/13 1334 08/11/13 0510 08/12/13 1415 08/13/13 0630 08/15/13 0425  NA 140 141 144 139 139 140  K 5.6* 5.3* 5.6* 5.2* 5.3* 5.1  CL 102 108 107 103 104 106  CO2 29  --  29 28 28 25   GLUCOSE 74 63* 86 81 99 85  BUN 59* 50* 51* 41* 38* 48*  CREATININE 2.90* 3.20* 2.67* 2.48* 2.61* 2.59*  CALCIUM 9.3  --  8.3* 8.6 8.9 8.8   Liver Function Tests: No results found for  this basename: AST, ALT, ALKPHOS, BILITOT, PROT, ALBUMIN,  in the last 168 hours No results found for this basename: LIPASE, AMYLASE,  in the last 168 hours No results found for this basename: AMMONIA,  in the last 168 hours CBC:  Recent Labs Lab 08/11/13 1604 08/12/13 0555 08/13/13 0630 08/13/13 1240 08/15/13 0425  WBC 8.0 7.6 7.4 7.5 7.9  NEUTROABS  --  5.9 5.8  --  6.0  HGB 7.5* 8.7* 8.7* 9.2* 8.3*  HCT 24.3* 28.2* 28.8* 30.3* 27.7*  MCV 78.1 79.4 80.7 80.6 81.2  PLT 266 263 250 265 246   Cardiac Enzymes: No results found for this basename: CKTOTAL, CKMB, CKMBINDEX, TROPONINI,  in the last 168 hours BNP (last 3 results)  Recent Labs   02/15/13 0434 07/05/13 1536 08/03/13 1122  PROBNP 438.6* 1581.0* 1789.0*   CBG:  Recent Labs Lab 08/14/13 0635 08/14/13 1201 08/14/13 1634 08/14/13 2053 08/15/13 0612  GLUCAP 93 109* 130* 129* 88    Recent Results (from the past 240 hour(s))  MRSA PCR SCREENING     Status: None   Collection Time    08/10/13  3:38 PM      Result Value Range Status   MRSA by PCR NEGATIVE  NEGATIVE Final   Comment:            The GeneXpert MRSA Assay (FDA     approved for NASAL specimens     only), is one component of a     comprehensive MRSA colonization     surveillance program. It is not     intended to diagnose MRSA     infection nor to guide or     monitor treatment for     MRSA infections.     DELTA CHECK NOTED     Studies: No results found.  Scheduled Meds: . allopurinol  100 mg Oral Daily  . amiodarone  200 mg Oral Daily  . amLODipine  10 mg Oral Daily  . atorvastatin  20 mg Oral QHS  . docusate sodium  100 mg Oral BID  . feeding supplement  30 mL Oral BID  . feeding supplement  1 Container Oral BID BM  . hydrALAZINE  50 mg Oral TID  . insulin aspart  0-15 Units Subcutaneous TID WC  . insulin glargine  10 Units Subcutaneous BID  . levofloxacin  250 mg Oral Daily  . metoprolol tartrate  25 mg Oral BID  . pantoprazole  40 mg Oral Daily  . sodium chloride  3 mL Intravenous Q12H   Continuous Infusions:   Principal Problem:   Bright red rectal bleeding Active Problems:   HYPERLIPIDEMIA   OBSTRUCTIVE SLEEP APNEA   HYPERTENSION   Acute combined systolic and diastolic heart failure   C V A / STROKE   ASTHMA   Obesity hypoventilation syndrome   Prosthetic joint infection   DM (diabetes mellitus)   Chronic anticoagulation   Chronic venous insufficiency   CKD (chronic kidney disease), stage IV   Anemia of chronic disease   PAF (paroxysmal atrial fibrillation)   Morbid obesity   Bleeding into the skin   Hyperkalemia   Acute-on-chronic kidney injury    Time  spent:    CHIU, STEPHEN K  [facility] Pager [phone]. If 7PM-7AM, please contact night-coverage at [email or web address], password [facility] 08/15/2013, 8:26 AM  LOS: 5 days

## 2013-08-15 NOTE — Progress Notes (Signed)
HR maintaining 56-58 bpm.  PM dose of Beta-blocker held per order.

## 2013-08-16 ENCOUNTER — Encounter (HOSPITAL_COMMUNITY): Payer: Self-pay | Admitting: *Deleted

## 2013-08-16 ENCOUNTER — Encounter (HOSPITAL_COMMUNITY)
Admission: EM | Disposition: A | Discharge status: Home / Self Care | Payer: Self-pay | Source: Home / Self Care | Attending: Internal Medicine

## 2013-08-16 DIAGNOSIS — E119 Type 2 diabetes mellitus without complications: Secondary | ICD-10-CM

## 2013-08-16 HISTORY — PX: COLONOSCOPY: SHX5424

## 2013-08-16 LAB — BASIC METABOLIC PANEL
BUN: 43 mg/dL — ABNORMAL HIGH (ref 6–23)
Calcium: 9.2 mg/dL (ref 8.4–10.5)
GFR calc non Af Amer: 21 mL/min — ABNORMAL LOW (ref >90)
Glucose, Bld: 101 mg/dL — ABNORMAL HIGH (ref 70–99)

## 2013-08-16 LAB — CBC
HCT: 31.6 % — ABNORMAL LOW (ref 36.0–46.0)
Hemoglobin: 9.3 g/dL — ABNORMAL LOW (ref 12.0–15.0)
MCH: 24 pg — ABNORMAL LOW (ref 26.0–34.0)
MCHC: 29.4 g/dL — ABNORMAL LOW (ref 30.0–36.0)

## 2013-08-16 LAB — GLUCOSE, CAPILLARY
Glucose-Capillary: 85 mg/dL (ref 70–99)
Glucose-Capillary: 115 mg/dL — ABNORMAL HIGH (ref 70–99)

## 2013-08-16 SURGERY — COLONOSCOPY
Anesthesia: Moderate Sedation

## 2013-08-16 MED ORDER — FENTANYL CITRATE 0.05 MG/ML IJ SOLN
INTRAMUSCULAR | Status: AC
  Filled 2013-08-16: qty 4

## 2013-08-16 MED ORDER — MIDAZOLAM HCL 5 MG/5ML IJ SOLN
INTRAMUSCULAR | Status: DC | PRN
  Administered 2013-08-16 (×3): 2 mg via INTRAVENOUS

## 2013-08-16 MED ORDER — MIDAZOLAM HCL 5 MG/ML IJ SOLN
INTRAMUSCULAR | Status: AC
  Filled 2013-08-16: qty 2

## 2013-08-16 MED ORDER — FENTANYL CITRATE 0.05 MG/ML IJ SOLN
INTRAMUSCULAR | Status: DC | PRN
  Administered 2013-08-16 (×3): 25 ug via INTRAVENOUS

## 2013-08-16 MED ORDER — DIPHENHYDRAMINE HCL 50 MG/ML IJ SOLN
INTRAMUSCULAR | Status: AC
  Filled 2013-08-16: qty 1

## 2013-08-16 MED ORDER — SODIUM POLYSTYRENE SULFONATE 15 GM/60ML PO SUSP
30.0000 g | Freq: Once | ORAL | Status: AC
  Administered 2013-08-16: 30 g via ORAL
  Filled 2013-08-16: qty 120

## 2013-08-16 NOTE — Interval H&P Note (Signed)
History and Physical Interval Note:  08/16/2013 12:27 PM  [name]  has presented today for surgery, with the diagnosis of Hematochezia  The various methods of treatment have been discussed with the patient and family. After consideration of risks, benefits and other options for treatment, the patient has consented to  Procedure(s): COLONOSCOPY (N/A) as a surgical intervention .  The patient's history has been reviewed, patient examined, no change in status, stable for surgery.  I have reviewed the patient's chart and labs.  Questions were answered to the patient's satisfaction.     [name]

## 2013-08-16 NOTE — Op Note (Signed)
[facility] [address] [place], [address]   OPERATIVE PROCEDURE REPORT  PATIENT: [name], [name]  MR#: [id number] BIRTHDATE: 11-12-1951  GENDER: Female ENDOSCOPIST: Jeani Hawking, MD ASSISTANT:   Kandice Robinsons, technician Dwain Sarna, RN CGRN PROCEDURE DATE: 08/16/2013 PROCEDURE:   Colonoscopy with snare polypectomy ASA CLASS:   Class IV INDICATIONS:Hematochezia. MEDICATIONS: Fentanyl 75 mcg IV and Versed 6 mg IV  DESCRIPTION OF PROCEDURE:   After the risks benefits and alternatives of the procedure were thoroughly explained, informed consent was obtained.  A digital rectal exam revealed no abnormalities of the rectum.    The [employer]ax Adult Colon [id number] endoscope was introduced through the anus  and advanced to the cecum, which was identified by both the appendix and ileocecal valve , No adverse events experienced.    The quality of the prep was good. .  The instrument was then slowly withdrawn as the colon was fully examined.     FINDINGS: Four sessile polyps were identified and removed with a cold snare.  The polyps were located in the cecum, ascending colon, and transverse colon.  Pan diverticulosis was found.  No evidence of any inflammation, ulcerations, erosions. or vascualar abnormalities.          The scope was then withdrawn from the patient and the procedure terminated.  COMPLICATIONS: There were no complications.  IMPRESSION: 1) Polyps. 2) Diverticula. 3) Hemorrhoids.  RECOMMENDATIONS: 1) Await biopsy results. 2) Repeat the colonoscopy in 3-5 years with Dr. Loreta Ave. 3) No further GI intervention at this time.  _______________________________ eSignedJeani Hawking, MD 08/16/2013 1:25 PM

## 2013-08-16 NOTE — Progress Notes (Signed)
Fentanyl 75 mcgs given,37mcgs wasted  In sink witness by [place].

## 2013-08-16 NOTE — Progress Notes (Addendum)
[facility] PROGRESS NOTE  [name] ZOX:[id number] DOB: 1951/07/21 DOA: 08/10/2013 PCP: Nancy Blade, MD  Assessment/Plan: [name]  - Overnight stable - Hgb has remained stable at around 8.5 s/p 2 units prbc's (from 6.8 on admit) - Pt w/ blood clots per rectum noted by staff on 08/12/13 - Colonoscopy done yesterday, revealing diverticula and hemorrhoids w/o active bleed. Appreciate GI recs [name] into the skin  - skin oozing over abd much improved - s/p FFP AKI on CKD (chronic kidney disease), stage IV  - Baseline creatinine appears to be around 2.5. Creatinine has remained near baseline during this admission - Renal ultrasound with R medical renal disease w/ atrophy. - Will cont to hold Lasix for now. - No signs of volume overload Anemia of chronic disease  - Baseline hemoglobin around 8. PAF (paroxysmal atrial fibrillation)  - CHADS of 5 - will greatly benefit from anticoagulation - Discussed with GI, OK to resume full dose anticoagulation - Rate controlled at this time. Normally on Coumadin and metoprolol. Follows with [name] . - Holding Coumadin secondary to recent [name].  - Would follow up with Nancy Sharyn [name] upon discharge.  Hyperkalemia  - Held aldactone. Monitor in tele. - Resolved s/p kayexalate  OBSTRUCTIVE SLEEP APNEA  - Had recommended bedtime CPAP - cont to refuse  HYPERTENSION  - Resumed amlodipine and metoprolol.  - Stable Acute combined systolic and diastolic heart failure  EF of 81%. Does not appear in volume overload clinically.  Resumed Toprol.  Prosthetic joint infection  Follows with orthopedics [name] and ID ( [name]) . Is on levaquin for suppressive abx.  DM (diabetes mellitus)  - Martha FSG and continue SSI. Will reduced dose of lantus (10units bid) - Suspect pt is non-compliant with diabetic diet at home Itching: - Improved with benadryl cream  Code Status: Full Family Communication:  Pt in room  (indicate person spoken with, relationship, and if by phone, the number) Disposition Plan: Pending home with HH/PT/OT  Consultants:  GI  Antibiotics:  Chronic Levaquin 08/10/13>>>  HPI/Subjective: No major events overnight. Tolerated colonoscopy yesterday  Objective: Filed Vitals:   08/15/13 1127 08/15/13 1408 08/15/13 1926 08/16/13 0404  BP:  166/50 164/55 165/48  Pulse: 62 64 54 60  Temp:  97.5 F (36.4 C) 97.3 F (36.3 C) 98.1 F (36.7 C)  TempSrc:  Oral Oral Oral  Resp:  19 20 20   Height:      Weight:      SpO2:  98% 98% 100%    Intake/Output Summary (Last 24 hours) at 08/16/13 0946 Last data filed at 08/16/13 0049  Gross per 24 hour  Intake   4120 ml  Output    613 ml  Net   3507 ml   Filed Weights   08/10/13 1435  Weight: 120.657 kg (266 lb)    Exam:   General:  Awake, in nad  Cardiovascular: Regular, s1, s2  Respiratory: normal resp effort, no wheezing  Abdomen: sort, nondistended  Musculoskeletal: perfused, no clubbing   Data Reviewed: Basic Metabolic Panel:  Recent Labs Lab 08/11/13 0510 08/12/13 1415 08/13/13 0630 08/15/13 0425 08/16/13 0525  NA 144 139 139 140 139  [name] 5.6* 5.2* 5.3* 5.1 5.5*  CL 107 103 104 106 106  CO2 29 28 28 25 23   GLUCOSE 86 81 99 85 101*  BUN 51* 41* 38* 48* 43*  CREATININE 2.67* 2.48* 2.61* 2.59* 2.34*  CALCIUM 8.3* 8.6 8.9 8.8 9.2  Liver Function Tests: No results found for this basename: AST, ALT, ALKPHOS, BILITOT, PROT, ALBUMIN,  in the last 168 hours No results found for this basename: LIPASE, AMYLASE,  in the last 168 hours No results found for this basename: AMMONIA,  in the last 168 hours CBC:  Recent Labs Lab 08/12/13 0555 08/13/13 0630 08/13/13 1240 08/15/13 0425 08/16/13 0525  WBC 7.6 7.4 7.5 7.9 8.2  NEUTROABS 5.9 5.8  --  6.0  --   HGB 8.7* 8.7* 9.2* 8.3* 9.3*  HCT 28.2* 28.8* 30.3* 27.7* 31.6*  MCV 79.4 80.7 80.6 81.2 81.4  PLT 263 250 265 246 271   Cardiac Enzymes: No results  found for this basename: CKTOTAL, CKMB, CKMBINDEX, TROPONINI,  in the last 168 hours BNP (last 3 results)  Recent Labs  02/15/13 0434 07/05/13 1536 08/03/13 1122  PROBNP 438.6* 1581.0* 1789.0*   CBG:  Recent Labs Lab 08/15/13 0612 08/15/13 1122 08/15/13 1626 08/15/13 2123 08/16/13 0639  GLUCAP 88 87 103* 135* 86    Recent Results (from the past 240 hour(s))  MRSA PCR SCREENING     Status: None   Collection Time    08/10/13  3:38 PM      Result Value Range Status   MRSA by PCR NEGATIVE  NEGATIVE Final   Comment:            The GeneXpert MRSA Assay (FDA     approved for NASAL specimens     only), is one component of a     comprehensive MRSA colonization     surveillance program. It is not     intended to diagnose MRSA     infection nor to guide or     monitor treatment for     MRSA infections.     DELTA CHECK NOTED     Studies: No results found.  Scheduled Meds: . allopurinol  100 mg Oral Daily  . amiodarone  200 mg Oral Daily  . amLODipine  10 mg Oral Daily  . atorvastatin  20 mg Oral QHS  . docusate sodium  100 mg Oral BID  . feeding supplement  30 mL Oral BID  . feeding supplement  1 Container Oral BID BM  . hydrALAZINE  50 mg Oral TID  . insulin aspart  0-15 Units Subcutaneous TID WC  . insulin glargine  10 Units Subcutaneous BID  . levofloxacin  250 mg Oral Daily  . metoprolol tartrate  25 mg Oral BID  . pantoprazole  40 mg Oral Daily  . sodium chloride  3 mL Intravenous Q12H   Continuous Infusions: . sodium chloride 20 mL/hr at 08/16/13 0750    Principal Problem:   [name] Active Problems:   HYPERLIPIDEMIA   OBSTRUCTIVE SLEEP APNEA   HYPERTENSION   Acute combined systolic and diastolic heart failure   C V A / STROKE   ASTHMA   Obesity hypoventilation syndrome   Prosthetic joint infection   DM (diabetes mellitus)   Chronic anticoagulation   Chronic venous insufficiency   CKD (chronic kidney disease), stage IV    Anemia of chronic disease   PAF (paroxysmal atrial fibrillation)   Morbid obesity   [name] into the skin   Hyperkalemia   Acute-on-chronic kidney injury  Time spent:  [name]  [facility] Pager [phone]. If 7PM-7AM, please contact night-coverage at [email or web address], password [facility] 08/16/2013, 9:46 AM  LOS: 6 days

## 2013-08-16 NOTE — Progress Notes (Signed)
OT Cancellation Note  Patient Details Name: [name] MRN: [id number] DOB: 02-Nov-1951   Cancelled Treatment:    Reason Eval/Treat Not Completed: Patient declined, stating too fatigue from being up all night in prep for colonoscopy. Pt adamant that she is not moving now and that she is not going to a SNF that family can assist at home. Will attempt at later date.    Ramari Bray A 08/16/2013, 11:17 AM

## 2013-08-16 NOTE — H&P (View-Only) (Signed)
[facility] PROGRESS NOTE  [name] ZOX:[id number] DOB: 11-09-1951 DOA: 08/10/2013 PCP: [name] [name], MD  Assessment/Plan: Bright red rectal bleeding  - Overnight stable - Hgb has remained stable at around 8.5 s/p 2 units prbc's (from 6.8 on admit) - Pending colon today. Pt w/ blood clots per rectum noted by staff on 08/12/13 Bleeding into the skin  - skin oozing over abd much improved - s/p FFP AKI on CKD (chronic kidney disease), stage IV  - Baseline creatinine appears to be around 2.5. Creatinine has remained near baseline during this admission - Renal ultrasound with R medical renal disease w/ atrophy. - Will cont to hold Lasix for now. - No signs of volume overload Anemia of chronic disease  - Baseline hemoglobin around 8. PAF (paroxysmal atrial fibrillation)  - Rate controlled at this time. Normally on Coumadin and metoprolol. Follows with Dr. Sharyn Lull . - Holding Coumadin secondary to recent bleeding.  - Would follow up with Dr Sharyn Lull upon discharge.  Hyperkalemia  - Hold aldactone. Monitor in tele.  OBSTRUCTIVE SLEEP APNEA  - Had recommended bedtime CPAP - cont to refuse  HYPERTENSION  - Resumed amlodipine and metoprolol.  - Stable Acute combined systolic and diastolic heart failure  EF of 81%. Does not appear in volume overload clinically.  Resumed Toprol.  Prosthetic joint infection  Follows with orthopedics Dr. Magnus Ivan and ID ( Dr Zenaida Niece dam) . Is on levaquin for suppressive abx.  DM (diabetes mellitus)  - Martha FSG and continue SSI. Will reduced dose of lantus (10units bid) - Suspect pt is non-compliant with diabetic diet at home Itching: - Improved with benadryl cream  Code Status: Full Family Communication:  Pt in room (indicate person spoken with, relationship, and if by phone, the number) Disposition Plan: Pending  Consultants:  GI  Antibiotics:  Chronic Levaquin 08/10/13>>>  HPI/Subjective: No major events overnight  Objective: Filed  Vitals:   08/15/13 1127 08/15/13 1408 08/15/13 1926 08/16/13 0404  BP:  166/50 164/55 165/48  Pulse: 62 64 54 60  Temp:  97.5 F (36.4 C) 97.3 F (36.3 C) 98.1 F (36.7 C)  TempSrc:  Oral Oral Oral  Resp:  19 20 20   Height:      Weight:      SpO2:  98% 98% 100%    Intake/Output Summary (Last 24 hours) at 08/16/13 0946 Last data filed at 08/16/13 0049  Gross per 24 hour  Intake   4120 ml  Output    613 ml  Net   3507 ml   Filed Weights   08/10/13 1435  Weight: 120.657 kg (266 lb)    Exam:   General:  Awake, in nad  Cardiovascular: Regular, s1, s2  Respiratory: normal resp effort, no wheezing  Abdomen: sort, nondistended  Musculoskeletal: perfused, no clubbing   Data Reviewed: Basic Metabolic Panel:  Recent Labs Lab 08/11/13 0510 08/12/13 1415 08/13/13 0630 08/15/13 0425 08/16/13 0525  NA 144 139 139 140 139  K 5.6* 5.2* 5.3* 5.1 5.5*  CL 107 103 104 106 106  CO2 29 28 28 25 23   GLUCOSE 86 81 99 85 101*  BUN 51* 41* 38* 48* 43*  CREATININE 2.67* 2.48* 2.61* 2.59* 2.34*  CALCIUM 8.3* 8.6 8.9 8.8 9.2   Liver Function Tests: No results found for this basename: AST, ALT, ALKPHOS, BILITOT, PROT, ALBUMIN,  in the last 168 hours No results found for this basename: LIPASE, AMYLASE,  in the last 168 hours No results found for this  basename: AMMONIA,  in the last 168 hours CBC:  Recent Labs Lab 08/12/13 0555 08/13/13 0630 08/13/13 1240 08/15/13 0425 08/16/13 0525  WBC 7.6 7.4 7.5 7.9 8.2  NEUTROABS 5.9 5.8  --  6.0  --   HGB 8.7* 8.7* 9.2* 8.3* 9.3*  HCT 28.2* 28.8* 30.3* 27.7* 31.6*  MCV 79.4 80.7 80.6 81.2 81.4  PLT 263 250 265 246 271   Cardiac Enzymes: No results found for this basename: CKTOTAL, CKMB, CKMBINDEX, TROPONINI,  in the last 168 hours BNP (last 3 results)  Recent Labs  02/15/13 0434 07/05/13 1536 08/03/13 1122  PROBNP 438.6* 1581.0* 1789.0*   CBG:  Recent Labs Lab 08/15/13 0612 08/15/13 1122 08/15/13 1626  08/15/13 2123 08/16/13 0639  GLUCAP 88 87 103* 135* 86    Recent Results (from the past 240 hour(s))  MRSA PCR SCREENING     Status: None   Collection Time    08/10/13  3:38 PM      Result Value Range Status   MRSA by PCR NEGATIVE  NEGATIVE Final   Comment:            The GeneXpert MRSA Assay (FDA     approved for NASAL specimens     only), is one component of a     comprehensive MRSA colonization     surveillance program. It is not     intended to diagnose MRSA     infection nor to guide or     monitor treatment for     MRSA infections.     DELTA CHECK NOTED     Studies: No results found.  Scheduled Meds: . allopurinol  100 mg Oral Daily  . amiodarone  200 mg Oral Daily  . amLODipine  10 mg Oral Daily  . atorvastatin  20 mg Oral QHS  . docusate sodium  100 mg Oral BID  . feeding supplement  30 mL Oral BID  . feeding supplement  1 Container Oral BID BM  . hydrALAZINE  50 mg Oral TID  . insulin aspart  0-15 Units Subcutaneous TID WC  . insulin glargine  10 Units Subcutaneous BID  . levofloxacin  250 mg Oral Daily  . metoprolol tartrate  25 mg Oral BID  . pantoprazole  40 mg Oral Daily  . sodium chloride  3 mL Intravenous Q12H   Continuous Infusions: . sodium chloride 20 mL/hr at 08/16/13 0750    Principal Problem:   Bright red rectal bleeding Active Problems:   HYPERLIPIDEMIA   OBSTRUCTIVE SLEEP APNEA   HYPERTENSION   Acute combined systolic and diastolic heart failure   C V A / STROKE   ASTHMA   Obesity hypoventilation syndrome   Prosthetic joint infection   DM (diabetes mellitus)   Chronic anticoagulation   Chronic venous insufficiency   CKD (chronic kidney disease), stage IV   Anemia of chronic disease   PAF (paroxysmal atrial fibrillation)   Morbid obesity   Bleeding into the skin   Hyperkalemia   Acute-on-chronic kidney injury  Time spent:  CHIU, STEPHEN K  [facility] Pager [phone]. If 7PM-7AM, please contact night-coverage  at [email or web address], password [facility] 08/16/2013, 9:46 AM  LOS: 6 days

## 2013-08-16 NOTE — Consult Note (Addendum)
WOC follow-up: Requested by bedside nurse to order Interdry for maceration in skin fold areas.  She describes that skin is moist and raw with multiple areas of fissures consistent with moisture associated skin damage to groin, abd and breast skin folds.  No further problems with bleeding according to bedside nurse.  Order placed for Interdry silver impregnated fabric to provide antimicrobial benefits and wick moisture away from skin.  Instructions provided for bedside nurses for appropriate usage of this product. Please re-consult if further assistance is needed.  Thank-you,  Cammie Mcgee MSN, RN, CWOCN, [place], CNS [phone]

## 2013-08-16 NOTE — Progress Notes (Signed)
PT Cancellation Note  Patient Details Name: [name] MRN: [id number] DOB: [birth year]   Cancelled Treatment:    Reason Eval/Treat Not Completed: Patient declined, stating too fatigue from being up all night in prep for colonoscopy. Pt adamant that she is not moving now and that she is not going to a SNF that family can assist at home. Will attempt at later date.    Toney Sang Beth 08/16/2013, 10:06 AM Delaney Meigs, PT [phone]

## 2013-08-17 ENCOUNTER — Encounter (HOSPITAL_COMMUNITY): Payer: Self-pay | Admitting: Gastroenterology

## 2013-08-17 LAB — BASIC METABOLIC PANEL
BUN: 41 mg/dL — ABNORMAL HIGH (ref 6–23)
Chloride: 109 mEq/L (ref 96–112)
GFR calc Af Amer: 21 mL/min — ABNORMAL LOW (ref >90)
GFR calc non Af Amer: 18 mL/min — ABNORMAL LOW (ref >90)
Potassium: 4.2 mEq/L (ref 3.5–5.1)
Sodium: 142 mEq/L (ref 135–145)

## 2013-08-17 LAB — CBC
HCT: 27.2 % — ABNORMAL LOW (ref 36.0–46.0)
Hemoglobin: 8.2 g/dL — ABNORMAL LOW (ref 12.0–15.0)
MCHC: 30.1 g/dL (ref 30.0–36.0)
RDW: 23.5 % — ABNORMAL HIGH (ref 11.5–15.5)
WBC: 8.5 10*3/uL (ref 4.0–10.5)

## 2013-08-17 LAB — PROTIME-INR
INR: 1.15 (ref 0.00–1.49)
Prothrombin Time: 14.5 seconds (ref 11.6–15.2)

## 2013-08-17 LAB — HEPARIN LEVEL (UNFRACTIONATED): Heparin Unfractionated: <0.1 IU/mL — ABNORMAL LOW (ref 0.30–0.70)

## 2013-08-17 LAB — GLUCOSE, CAPILLARY
Glucose-Capillary: 116 mg/dL — ABNORMAL HIGH (ref 70–99)
Glucose-Capillary: 130 mg/dL — ABNORMAL HIGH (ref 70–99)

## 2013-08-17 MED ORDER — WARFARIN - PHARMACIST DOSING INPATIENT: Freq: Every day | Status: DC

## 2013-08-17 MED ORDER — WARFARIN SODIUM 10 MG PO TABS
10.0000 mg | ORAL_TABLET | Freq: Once | ORAL | Status: AC
  Administered 2013-08-17: 10 mg via ORAL
  Filled 2013-08-17: qty 1

## 2013-08-17 MED ORDER — HEPARIN BOLUS VIA INFUSION
2000.0000 [IU] | Freq: Once | INTRAVENOUS | Status: AC
  Administered 2013-08-17: 2000 [IU] via INTRAVENOUS
  Filled 2013-08-17: qty 2000

## 2013-08-17 MED ORDER — INFLUENZA VAC SPLIT QUAD 0.5 ML IM SUSP
0.5000 mL | INTRAMUSCULAR | Status: AC
  Administered 2013-08-18: 0.5 mL via INTRAMUSCULAR
  Filled 2013-08-17: qty 0.5

## 2013-08-17 MED ORDER — HEPARIN (PORCINE) IN NACL 100-0.45 UNIT/ML-% IJ SOLN
1500.0000 [IU]/h | INTRAMUSCULAR | Status: AC
  Administered 2013-08-17: 1100 [IU]/h via INTRAVENOUS
  Administered 2013-08-17 – 2013-08-18 (×2): 1500 [IU]/h via INTRAVENOUS
  Filled 2013-08-17 (×4): qty 250

## 2013-08-17 MED ORDER — HEPARIN BOLUS VIA INFUSION
4000.0000 [IU] | Freq: Once | INTRAVENOUS | Status: AC
  Administered 2013-08-17: 4000 [IU] via INTRAVENOUS
  Filled 2013-08-17: qty 4000

## 2013-08-17 NOTE — Progress Notes (Signed)
Physical Therapy Treatment Patient Details Name: [name] MRN: [id number] DOB: 1951-02-09 Today's Date: 08/17/2013 Time: 4098-1191 PT Time Calculation (min): 25 min  PT Assessment / Plan / Recommendation  History of Present Illness Admitted with SOB and L LE pain (chronic).  Hx of anemia, morbid obesity, CHF, OSA, DM, HTN.  Was hospitalized approximately 1 month ago. Trying to figure out if there is anything else they can do for her knee.   PT Comments   Pt progressing well with mobility and states she has 24hr assist at home of spouse and son. Pt encouraged to increase daily ambulation to 3x acutely and at home as well as to continue HEP. Will follow acutely  Follow Up Recommendations  Home health PT;Supervision for mobility/OOB     Does the patient have the potential to tolerate intense rehabilitation     Barriers to Discharge        Equipment Recommendations  None recommended by PT    Recommendations for Other Services    Frequency     Progress towards PT Goals Progress towards PT goals: Progressing toward goals  Plan Discharge plan needs to be updated    Precautions / Restrictions Precautions Precautions: Fall Restrictions Weight Bearing Restrictions: No   Pertinent Vitals/Pain 3/10 chronic LLE pain    Mobility  Bed Mobility Bed Mobility: Not assessed Transfers Sit to Stand: 5: Supervision;From chair/3-in-1 Stand to Sit: 5: Supervision;To chair/3-in-1 Stand Pivot Transfers: 4: Min guard Details for Transfer Assistance: pt stating she feels surface when backing prior to sitting so no cueing needed but held [facility] to keep it from moving. pt stood from chair and [facility] without physical assist but did require total assist for pericare stating she can turn sideways on her commode at home to complete but no room here Ambulation/Gait Ambulation/Gait Assistance: 5: Supervision Ambulation Distance (Feet): 130 Feet Assistive device: Rolling walker Ambulation/Gait Assistance  Details: cueing for upright posture, pt with forward trunk and increased bil UE use due to continued LLE pain Gait Pattern: Step-through pattern;Decreased stride length;Decreased stance time - left Stairs: No    Exercises General Exercises - Lower Extremity Long Arc Quad: AROM;Right;15 reps;Seated (pt unable to perform on left due to pain)   PT Diagnosis:    PT Problem List:   PT Treatment Interventions:     PT Goals (current goals can now be found in the care plan section)    Visit Information  Last PT Received On: 08/17/13 Assistance Needed: +1 History of Present Illness: Admitted with SOB and L LE pain (chronic).  Hx of anemia, morbid obesity, CHF, OSA, DM, HTN.  Was hospitalized approximately 1 month ago. Trying to figure out if there is anything else they can do for her knee.    Subjective Data      Cognition  Cognition Arousal/Alertness: Awake/alert Behavior During Therapy: WFL for tasks assessed/performed Overall Cognitive Status: Within Functional Limits for tasks assessed    Balance     End of Session PT - End of Session Equipment Utilized During Treatment: Gait belt Activity Tolerance: Patient tolerated treatment well Patient left: in chair;with call bell/phone within reach   GP     Delorse Lek 08/17/2013, 10:22 AM Delaney Meigs, PT [phone]

## 2013-08-17 NOTE — Progress Notes (Signed)
OT Cancellation Note  Patient Details Name: VARA MAIRENA MRN: [id number] DOB: [birth year]   Cancelled Treatment:    Reason Eval/Treat Not Completed: Patient declined, received phone call when writer entered room and it was a conference call for a family member for discharge planning from nursing home.   Demeshia Sherburne A 08/17/2013, 11:35 AM

## 2013-08-17 NOTE — Progress Notes (Signed)
ANTICOAGULATION CONSULT NOTE - Initial Consult  Pharmacy Consult for heparin + warfarin Indication: atrial fibrillation  Allergies  Allergen Reactions  . Daptomycin Other (See Comments)    Elevated CPK  . Morphine And Related Nausea And Vomiting  . Cephalexin Rash    unknown  . Celecoxib     unknown  . Codeine     unknown  . Fluoxetine Hcl     unknown  . Latex     REACTION: undefined  . Ofloxacin     unknown  . Penicillins     unknown  . Rofecoxib     unknown  . Sulfonamide Derivatives     unknown    Patient Measurements: Height: 5\' 3"  (160 cm) Weight: 266 lb (120.657 kg) IBW/kg (Calculated) : 52.4 Heparin Dosing Weight: 82kg  Vital Signs: Temp: 98.2 F (36.8 C) (09/09 0606) Temp src: Oral (09/09 0606) BP: 136/45 mmHg (09/09 0606) Pulse Rate: 55 (09/09 0606)  Labs:  Recent Labs  08/15/13 0425 08/16/13 0525 08/17/13 0443  HGB 8.3* 9.3* 8.2*  HCT 27.7* 31.6* 27.2*  PLT 246 271 270  LABPROT 17.1* 14.8 14.5  INR 1.43 1.19 1.15  CREATININE 2.59* 2.34* 2.63*    Estimated Creatinine Clearance: 27.5 ml/min (by C-G formula based on Cr of 2.63).   Medical History: Past Medical History  Diagnosis Date  . Asthma   . Bronchitis   . Allergic rhinitis   . HTN (hypertension)   . Hyperlipidemia   . Obesity   . OSA (obstructive sleep apnea)     poor compliance with cpap  . CHF (congestive heart failure)   . Biventricular failure     compensated  . Psoriasis   . Stasis edema     of lower extremities  . Depression   . Situational stress   . Kidney stones   . Kidney disease, chronic, stage III (GFR 30-59 ml/min)   . Anemia   . Gout   . Chronic anticoagulation   . Yeast infection involving the vagina and surrounding area   . Atrial fibrillation with RVR   . GERD (gastroesophageal reflux disease)   . Chronic bronchitis     "get it q yr" (08/10/2013)  . Pneumonia     "said I did on 08/03/2013 then they ruled it out" (08/10/2013)  . Shortness of breath      "all the time" (08/10/2013)  . On home oxygen therapy     "2L during the night and prn during the day" (08/10/2013)  . Type II diabetes mellitus   . History of blood transfusion     "few during my lifetime; last time was 4 days ago when I got 2 units" (08/10/2013)  . Stroke ?2007    denies residuals on 08/10/2013  . DJD (degenerative joint disease)   . Bleeding     "from my skin folds; just won't stop" (08/10/2013)  . Complication of anesthesia     "they gave me too much; I stayed out of it for awhile" (08/10/2013)    Assessment: 62 YOF who was on warfarin PTA for AFib (10mg  daily) admitted with INR 2.77. Warfarin held for colonoscopy with her last dose 9/1. Now is s/o colonoscopy with 4 polyps removed and GI ok to restart full anticoag- discussed with Dr. Rhona Leavens and decided to start heparin as bridge to warfarin and will work on possibility of Lovenox to finish bridge as outpatient. INR today 1.1. Hgb low but stable over the past week or so, platelets  WNL.  Goal of Therapy:  INR 2-3 Heparin level 0.3-0.7 units/ml Monitor platelets by anticoagulation protocol: Yes   Plan:  1. Heparin bolus with 4000 units IV x1 2. Heparin drip to start at 1100 units/hr 3. HL in 6 hours 4. Warfarin 10mg  po x1 tonight 5. Daily PT/INR 6. Follow for any signs of bleeding 7. Will work with care management about possibility of Lovenox as outpatient  Kavish Lafitte D. Vanette Noguchi, PharmD Clinical Pharmacist Pager: [phone] 08/17/2013 10:29 AM

## 2013-08-17 NOTE — Progress Notes (Signed)
NUTRITION FOLLOW UP  Intervention:   1. D/c Resource Breeze as intake has improved  2. Continue 30 ml Pro-stat BID, each supplement provides 100 kcal and 15 gm protein    Nutrition Dx:   Increased nutrient needs related to wound healing as evidenced by multiple areas of partial thickness loss   Goal:   PO intake to meet >/=90% estimated nutrition needs. Likely met   Monitor:   PO intake, weight trends, wound healing  Assessment:   S/p colonoscopy on 9/8- diverticula and hemorrhoids w/o active bleeding.  Pt bleeding from skin breakdown improved per notes.  Pt now with diet advanced to carb mod medium, pt states she is eating well. Discussed oral nutrition supplements with pt, pt wishes to continue to drink [employer] because "it tastes good". Explained that with increased intake it was no longer necessary but that Pro-stat protein supplements were likely of more benefit. Pt does not like the taste but is willing to try mixing with other beverages. Will d/c orders for Resource Breeze to avoid additional weight gain as pt is obese class 3.   Height: Ht Readings from Last 1 Encounters:  08/10/13 5\' 3"  (1.6 m)    Weight Status:   Wt Readings from Last 1 Encounters:  08/10/13 266 lb (120.657 kg)  no new weight since admission   Re-estimated needs:  Kcal: 2000-2200  Protein: 85-100 gm  Fluid: 2-2.2 L  Skin: multiple areas of skin breakdown noted per WOC notes.   Diet Order: Carb Control   Intake/Output Summary (Last 24 hours) at 08/17/13 1453 Last data filed at 08/17/13 1245  Gross per 24 hour  Intake    720 ml  Output      2 ml  Net    718 ml    Last BM: 9/9   Labs:   Recent Labs Lab 08/15/13 0425 08/16/13 0525 08/17/13 0443  NA 140 139 142  K 5.1 5.5* 4.2  CL 106 106 109  CO2 25 23 24   BUN 48* 43* 41*  CREATININE 2.59* 2.34* 2.63*  CALCIUM 8.8 9.2 8.4  GLUCOSE 85 101* 134*    CBG (last 3)   Recent Labs  08/16/13 2112 08/17/13 0606  08/17/13 1129  GLUCAP 151* 116* 130*    Scheduled Meds: . allopurinol  100 mg Oral Daily  . amiodarone  200 mg Oral Daily  . amLODipine  10 mg Oral Daily  . atorvastatin  20 mg Oral QHS  . docusate sodium  100 mg Oral BID  . feeding supplement  30 mL Oral BID  . feeding supplement  1 Container Oral BID BM  . hydrALAZINE  50 mg Oral TID  . [START ON 08/18/2013] influenza vac split quadrivalent PF  0.5 mL Intramuscular Tomorrow-1000  . insulin aspart  0-15 Units Subcutaneous TID WC  . insulin glargine  10 Units Subcutaneous BID  . levofloxacin  250 mg Oral Daily  . metoprolol tartrate  25 mg Oral BID  . pantoprazole  40 mg Oral Daily  . sodium chloride  3 mL Intravenous Q12H  . warfarin  10 mg Oral ONCE-1800  . Warfarin - Pharmacist Dosing Inpatient   Does not apply q1800    Continuous Infusions: . heparin 1,100 Units/hr (08/17/13 1242)    Clarene Duke RD, LDN Pager [phone] After Hours pager [phone]

## 2013-08-17 NOTE — Progress Notes (Signed)
ANTICOAGULATION CONSULT NOTE - Follow Up Consult  Pharmacy Consult for heparin + warfarin Indication: atrial fibrillation  Allergies  Allergen Reactions  . Daptomycin Other (See Comments)    Elevated CPK  . Morphine And Related Nausea And Vomiting  . Cephalexin Rash    unknown  . Celecoxib     unknown  . Codeine     unknown  . Fluoxetine Hcl     unknown  . Latex     REACTION: undefined  . Ofloxacin     unknown  . Penicillins     unknown  . Rofecoxib     unknown  . Sulfonamide Derivatives     unknown   Patient Measurements: Height: 5\' 3"  (160 cm) Weight: 266 lb (120.657 kg) IBW/kg (Calculated) : 52.4 Heparin Dosing Weight: 82kg  Vital Signs: Temp: 97.8 F (36.6 C) (09/09 1410) Temp src: Oral (09/09 1410) BP: 157/52 mmHg (09/09 1410) Pulse Rate: 60 (09/09 1410)  Labs:  Recent Labs  08/15/13 0425 08/16/13 0525 08/17/13 0443 08/17/13 1706  HGB 8.3* 9.3* 8.2*  --   HCT 27.7* 31.6* 27.2*  --   PLT 246 271 270  --   LABPROT 17.1* 14.8 14.5  --   INR 1.43 1.19 1.15  --   HEPARINUNFRC  --   --   --  <0.10*  CREATININE 2.59* 2.34* 2.63*  --    Estimated Creatinine Clearance: 27.5 ml/min (by C-G formula based on Cr of 2.63).  Medical History: Past Medical History  Diagnosis Date  . Asthma   . Bronchitis   . Allergic rhinitis   . HTN (hypertension)   . Hyperlipidemia   . Obesity   . OSA (obstructive sleep apnea)     poor compliance with cpap  . CHF (congestive heart failure)   . Biventricular failure     compensated  . Psoriasis   . Stasis edema     of lower extremities  . Depression   . Situational stress   . Kidney stones   . Kidney disease, chronic, stage III (GFR 30-59 ml/min)   . Anemia   . Gout   . Chronic anticoagulation   . Yeast infection involving the vagina and surrounding area   . Atrial fibrillation with RVR   . GERD (gastroesophageal reflux disease)   . Chronic bronchitis     "get it q yr" (08/10/2013)  . Pneumonia     "said I  did on 08/03/2013 then they ruled it out" (08/10/2013)  . Shortness of breath     "all the time" (08/10/2013)  . On home oxygen therapy     "2L during the night and prn during the day" (08/10/2013)  . Type II diabetes mellitus   . History of blood transfusion     "few during my lifetime; last time was 4 days ago when I got 2 units" (08/10/2013)  . Stroke ?2007    denies residuals on 08/10/2013  . DJD (degenerative joint disease)   . Bleeding     "from my skin folds; just won't stop" (08/10/2013)  . Complication of anesthesia     "they gave me too much; I stayed out of it for awhile" (08/10/2013)   Assessment: 62 YOF who was on warfarin PTA for AFib (10mg  daily) admitted with INR 2.77. Warfarin held for colonoscopy with her last dose 9/1. Now is s/o colonoscopy with 4 polyps removed and GI ok to restart full anticoag- discussed with Dr. Rhona Leavens and decided to start heparin as  bridge to warfarin and will work on possibility of Lovenox to finish bridge as outpatient. INR today 1.1. Hgb low but stable over the past week or so, platelets WNL.  Heparin drip started later than planned, thus level would be expected to be high but in fact it is < 0.1 on IV rate of 1100 units/hr.  She is without noted bleeding complications per her nurse this evening and no IV line issues.  Goal of Therapy:  INR 2-3 Heparin level 0.3-0.7 units/ml Monitor platelets by anticoagulation protocol: Yes   Plan:  1. Heparin bolus with 2000 units IV x1 2. Increase Heparin drip to 1500 units/hr 3. HL in 6 hours  [name], PharmD., MS Clinical Pharmacist Pager:  [phone] Thank you for allowing pharmacy to be part of this patients care team. 08/17/2013 6:45 PM

## 2013-08-18 LAB — CBC
HCT: 26.3 % — ABNORMAL LOW (ref 36.0–46.0)
Hemoglobin: 8 g/dL — ABNORMAL LOW (ref 12.0–15.0)
MCH: 24.8 pg — ABNORMAL LOW (ref 26.0–34.0)
MCHC: 30.4 g/dL (ref 30.0–36.0)
MCV: 81.4 fL (ref 78.0–100.0)
RDW: 23.5 % — ABNORMAL HIGH (ref 11.5–15.5)

## 2013-08-18 LAB — BASIC METABOLIC PANEL
BUN: 43 mg/dL — ABNORMAL HIGH (ref 6–23)
Calcium: 8.4 mg/dL (ref 8.4–10.5)
Creatinine, Ser: 2.75 mg/dL — ABNORMAL HIGH (ref 0.50–1.10)
GFR calc Af Amer: 20 mL/min — ABNORMAL LOW (ref >90)
GFR calc non Af Amer: 17 mL/min — ABNORMAL LOW (ref >90)
Glucose, Bld: 107 mg/dL — ABNORMAL HIGH (ref 70–99)
Potassium: 4.3 mEq/L (ref 3.5–5.1)

## 2013-08-18 LAB — GLUCOSE, CAPILLARY: Glucose-Capillary: 93 mg/dL (ref 70–99)

## 2013-08-18 MED ORDER — WARFARIN SODIUM 2.5 MG PO TABS
12.5000 mg | ORAL_TABLET | Freq: Once | ORAL | Status: AC
  Administered 2013-08-18: 12.5 mg via ORAL
  Filled 2013-08-18: qty 1

## 2013-08-18 MED ORDER — ENOXAPARIN SODIUM 120 MG/0.8ML ~~LOC~~ SOLN: 120.0000 mg | SUBCUTANEOUS | Status: DC

## 2013-08-18 MED ORDER — ENOXAPARIN SODIUM 120 MG/0.8ML ~~LOC~~ SOLN
120.0000 mg | SUBCUTANEOUS | Status: DC
  Filled 2013-08-18: qty 0.8

## 2013-08-18 MED ORDER — HEPARIN (PORCINE) IN NACL 100-0.45 UNIT/ML-% IJ SOLN: 1500.0000 [IU]/h | INTRAMUSCULAR | Status: AC

## 2013-08-18 MED ORDER — ENOXAPARIN SODIUM 120 MG/0.8ML ~~LOC~~ SOLN
120.0000 mg | SUBCUTANEOUS | Status: DC
  Administered 2013-08-18 – 2013-08-19 (×2): 120 mg via SUBCUTANEOUS
  Filled 2013-08-18 (×2): qty 0.8

## 2013-08-18 NOTE — Evaluation (Signed)
Occupational Therapy Evaluation Patient Details Name: [name] MRN: [id number] DOB: 1951/01/08 Today's Date: 08/18/2013 Time: 6295-2841 OT Time Calculation (min): 29 min  OT Assessment / Plan / Recommendation History of present illness Admitted with SOB and L LE pain (chronic).  Hx of anemia, morbid obesity, CHF, OSA, DM, HTN.  Was hospitalized approximately 1 month ago. Trying to figure out if there is anything else they can do for her knee.   Clinical Impression   Pt admitted with above. Will continue to follow pt acutely in order to address below problem list. Recommending HHOT for d/c planning.    OT Assessment  Patient needs continued OT Services    Follow Up Recommendations  Home health OT;Supervision/Assistance - 24 hour    Barriers to Discharge      Equipment Recommendations  Tub/shower seat    Recommendations for Other Services    Frequency  Min 2X/week    Precautions / Restrictions Precautions Precautions: Fall Restrictions Weight Bearing Restrictions: No   Pertinent Vitals/Pain See vitals    ADL  Eating/Feeding: Performed;Independent Where Assessed - Eating/Feeding: Chair Grooming: Performed;Wash/dry hands;Supervision/safety Where Assessed - Grooming: Unsupported standing Upper Body Bathing: Simulated;Supervision/safety;Set up Where Assessed - Upper Body Bathing: Unsupported sitting Lower Body Bathing: Simulated;Moderate assistance Where Assessed - Lower Body Bathing: Supported sit to stand Upper Body Dressing: Performed;Set up Where Assessed - Upper Body Dressing: Unsupported sitting Lower Body Dressing: Simulated;Moderate assistance Where Assessed - Lower Body Dressing: Supported sit to stand Toilet Transfer: Research scientist (life sciences) Method: Sit to Barista: Materials engineer and Hygiene: Performed;Maximal assistance Where Assessed - Engineer, mining and  Hygiene: Sit to stand from 3-in-1 or toilet Equipment Used: Gait belt;Rolling walker Transfers/Ambulation Related to ADLs: Supervision with RW.  ADL Comments: Pt unable to perform toileting hygiene while sitting on 3n1 due to unable to spread legs with armrests (is able to perform hygiene sitting on toilet at home).  3/4 dyspnea during session.    OT Diagnosis: Generalized weakness  OT Problem List: Impaired balance (sitting and/or standing);Decreased activity tolerance;Decreased knowledge of use of DME or AE;Cardiopulmonary status limiting activity;Obesity;Decreased strength OT Treatment Interventions: Self-care/ADL training;DME and/or AE instruction;Therapeutic activities;Patient/family education;Energy conservation   OT Goals(Current goals can be found in the care plan section) Acute Rehab OT Goals Patient Stated Goal: return home OT Goal Formulation: With patient Time For Goal Achievement: 09/01/13 Potential to Achieve Goals: Good  Visit Information  Last OT Received On: 08/18/13 Assistance Needed: +1 History of Present Illness: Admitted with SOB and L LE pain (chronic).  Hx of anemia, morbid obesity, CHF, OSA, DM, HTN.  Was hospitalized approximately 1 month ago. Trying to figure out if there is anything else they can do for her knee.       Prior Functioning     Home Living Family/patient expects to be discharged to:: Private residence Living Arrangements: Spouse/significant other;Children Available Help at Discharge: Family;Available 24 hours/day Type of Home: House Home Access: Ramped entrance Home Layout: One level Home Equipment: Walker - 2 wheels Additional Comments: pt on 2LO2 at home during the night and during the day as needed Prior Function Level of Independence: Independent with assistive device(s);Needs assistance Gait / Transfers Assistance Needed: uses RW ADL's / Homemaking Assistance Needed: avoids socks, wears slip on shoes         Vision/Perception  Vision - History Baseline Vision: Wears glasses all the time Patient Visual Report: No change from baseline   Cognition  Cognition  Arousal/Alertness: Awake/alert Behavior During Therapy: WFL for tasks assessed/performed Overall Cognitive Status: Within Functional Limits for tasks assessed    Extremity/Trunk Assessment Upper Extremity Assessment Upper Extremity Assessment: Overall WFL for tasks assessed     Mobility Bed Mobility Bed Mobility: Not assessed (pt up in chair) Transfers Transfers: Sit to Stand;Stand to Sit Sit to Stand: 5: Supervision;From chair/3-in-1;With upper extremity assist Stand to Sit: 5: Supervision;To chair/3-in-1;With upper extremity assist Details for Transfer Assistance: pt able to transfer chair to [facility] without physical assist, is performing transfers safely     Exercise     Balance Balance Balance Assessed: Yes Dynamic Standing Balance Dynamic Standing - Balance Support: Right upper extremity supported;During functional activity Dynamic Standing - Level of Assistance: 5: Stand by assistance   End of Session OT - End of Session Equipment Utilized During Treatment: Gait belt;Rolling walker Activity Tolerance: Patient tolerated treatment well Patient left: in chair;with call bell/phone within reach Nurse Communication: Mobility status  GO   08/18/2013 Cipriano Mile OTR/L Pager [phone] Office [phone]   Cipriano Mile 08/18/2013, 3:57 PM

## 2013-08-18 NOTE — Progress Notes (Signed)
Pt. Refused CPAP at this time. Encouraged to call if she would like to wear CPAP.

## 2013-08-18 NOTE — Progress Notes (Signed)
[facility]                                                                                Patient Demographics  [name], is a 62 y.o. female, DOB - May 28, 1951, ZOX:[id number]  Admit date - 08/10/2013   Admitting Physician No admitting provider for patient encounter.  Outpatient Primary MD for the patient is DEAN, [name], MD  LOS - 8   Chief Complaint  Patient presents with  . Coagulation Disorder        Assessment & Plan    Bright red rectal bleeding  - Overnight stable  - Hgb has remained stable at around 8.5 s/p 2 units prbc's (from 6.8 on admit)  - Pt w/ blood clots per rectum noted by staff on 08/12/13  - Colonoscopy done yesterday, revealing diverticula and hemorrhoids w/o active bleed. Appreciate GI recs     Bleeding into the skin  - skin oozing over abd due to high INR, resolved  s/p FFP     AKI on CKD (chronic kidney disease), stage IV  - Baseline creatinine appears to be around 2.5. Creatinine has remained near baseline during this admission  - Renal ultrasound with R medical renal disease w/ atrophy.  - Will cont to hold Lasix for now.  - No signs of volume overload     Anemia of chronic disease  - Baseline hemoglobin around 8.     PAF (paroxysmal atrial fibrillation)  - CHADS of 5 - will greatly benefit from anticoagulation  - Discussed with GI, OK to resume full dose anticoagulation  - Rate controlled at this time. Normally on Coumadin and metoprolol. Follows with Dr. Sharyn Lull .  -  Would follow up with Dr Sharyn Lull upon discharge.  -Tolerated 24 hours of heparin drip, now switched to Lovenox and Coumadin overlap on 08/18/2013 with close monitoring clinically.   Hyperkalemia  - Held aldactone. Stable on tele.  - Resolved s/p kayexalate     OBSTRUCTIVE SLEEP APNEA  - Had recommended bedtime CPAP  - cont to refuse     HYPERTENSION  - Resumed amlodipine and metoprolol.  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 Does not appear in volume overload clinically.  Resumed Toprol.     Prosthetic joint infection  Follows with orthopedics Dr. Magnus Ivan and ID ( Dr Zenaida Niece dam) . Is on chronic levaquin for suppressive abx.     DM (diabetes mellitus)  - Lantus and continue SSI.   - Suspect pt is non-compliant with diabetic diet at home   CBG (last 3)   Recent Labs  08/17/13 1636 08/17/13 2117 08/18/13 0620  GLUCAP 123* 167* 93     Lab Results  Component Value Date   HGBA1C 7.0* 11/23/2011      Itching:  - Improved with benadryl cream     Code Status: full  Family Communication: none present  Disposition Plan: home    Procedures     Consults  GI-Hung   DVT Prophylaxis   Lovenox/Coumadin  Lab Results  Component Value Date   PLT 244 08/18/2013    Medications  Scheduled Meds: .  allopurinol  100 mg Oral Daily  . amiodarone  200 mg Oral Daily  . amLODipine  10 mg Oral Daily  . atorvastatin  20 mg Oral QHS  . docusate sodium  100 mg Oral BID  . enoxaparin (LOVENOX) injection  120 mg Subcutaneous Q24H  . feeding supplement  30 mL Oral BID  . hydrALAZINE  50 mg Oral TID  . influenza vac split quadrivalent PF  0.5 mL Intramuscular Tomorrow-1000  . insulin aspart  0-15 Units Subcutaneous TID WC  . insulin glargine  10 Units Subcutaneous BID  . levofloxacin  250 mg Oral Daily  . metoprolol tartrate  25 mg Oral BID  . pantoprazole  40 mg Oral Daily  . sodium chloride  3 mL Intravenous Q12H  . warfarin  12.5 mg Oral ONCE-1800  . Warfarin - Pharmacist Dosing Inpatient   Does not apply q1800   Continuous Infusions: . heparin 1,500 Units/hr (08/18/13 0303)   PRN Meds:.acetaminophen, acetaminophen, albuterol, dextrose, diphenhydrAMINE-zinc acetate, glucose-Vitamin C, oxyCODONE-acetaminophen, zolpidem  Antibiotics     Anti-infectives   Start     Dose/Rate Route Frequency Ordered Stop   08/10/13 1700  levofloxacin (LEVAQUIN) tablet 250 mg     250 mg  Oral Daily 08/10/13 1453         Time Spent in minutes   40   Alphons Burgert K M.D on 08/18/2013 at 10:38 AM  Between 7am to 7pm - Pager - [phone]  After 7pm go to [email or web address] - password [email or web address]  And look for the night coverage person covering for me after hours  [facility] Office  [phone]    Subjective:   [name] today has, No headache, No chest pain, No abdominal pain - No Nausea, No new weakness tingling or numbness, No Cough - SOB.    Objective:   Filed Vitals:   08/17/13 1035 08/17/13 1410 08/17/13 2120 08/18/13 0445  BP: 147/42 157/52 155/40 161/49  Pulse:  60 60 57  Temp:  97.8 F (36.6 C) 98 F (36.7 C) 97.5 F (36.4 C)  TempSrc:  Oral Oral Oral  Resp:  17 18 18   Height:      Weight:      SpO2:  98% 97% 95%    Wt Readings from Last 3 Encounters:  08/10/13 120.657 kg (266 lb)  08/10/13 120.657 kg (266 lb)  08/08/13 120.1 kg (264 lb 12.4 oz)     Intake/Output Summary (Last 24 hours) at 08/18/13 1038 Last data filed at 08/18/13 0806  Gross per 24 hour  Intake   1380 ml  Output    500 ml  Net    880 ml    Exam Awake Alert, Oriented X 3, No new F.N deficits, Normal affect [place].AT,PERRAL Supple Neck,No JVD, No cervical lymphadenopathy appriciated.  Symmetrical Chest wall movement, Good air movement bilaterally, CTAB RRR,No Gallops,Rubs or new Murmurs, No Parasternal Heave +ve B.Sounds, Abd Soft, Non tender, No organomegaly appriciated, No rebound - guarding or rigidity. No Cyanosis, Clubbing or edema, No new Rash or bruise      Data Review   Micro Results Recent Results (from the past 240 hour(s))  MRSA PCR SCREENING     Status: None   Collection Time    08/10/13  3:38 PM      Result Value Range Status   MRSA by PCR NEGATIVE  NEGATIVE Final   Comment:            The GeneXpert MRSA Assay (FDA  approved for NASAL specimens     only), is one component of a     comprehensive MRSA colonization     surveillance  program. It is not     intended to diagnose MRSA     infection nor to guide or     monitor treatment for     MRSA infections.     DELTA CHECK NOTED    Radiology Reports Dg Chest 1 View  08/03/2013   CLINICAL DATA:  shortness of breath, weakness. Left knee swelling.  EXAM: CHEST - 1 VIEW  COMPARISON:  07/05/2013  FINDINGS: Mild cardiomegaly with vascular congestion. Mild right infrahilar airspace opacity, atelectasis versus infiltrate. No confluent opacity on the left. No visible effusions. No overt edema or acute bony abnormality.  IMPRESSION: Cardiomegaly, vascular congestion.  Right infrahilar atelectasis or infiltrate.   Electronically Signed   By: Charlett Nose   On: 08/03/2013 13:14   US Renal  08/12/2013   *RADIOLOGY REPORT*  Clinical Data: Acute kidney injury.  RENAL/URINARY TRACT ULTRASOUND COMPLETE  Comparison:  01/23/2012.  Findings:  Right Kidney:  9 cm in length.  No hydronephrosis.  Diffuse increased renal echogenicity.  There are two dominant cysts, also seen previously, located in the interpolar and lower pole regions. The largest is in the lower pole, 1.7 cm in diameter.  Left Kidney:  11 cm in length.  No hydronephrosis.  Renal echogenicity more normal in appearance.  No focal abnormality.  Bladder:  98 ml pre void.  No focal wall thickening or filling defect.  IMPRESSION:  1.  No hydronephrosis. 2.  Medical renal disease on the right, with atrophy.   Original Report Authenticated By: Tiburcio Pea   Dg Knee Complete 4 Views Left  08/03/2013   CLINICAL DATA:  Shortness of breath, weakness.  EXAM: LEFT KNEE - COMPLETE 4+ VIEW  COMPARISON:  06/09/2012  FINDINGS: Changes of left knee replacement. No joint effusion. No acute fracture, subluxation or dislocation.  IMPRESSION: Left knee replacement. No complicating feature.   Electronically Signed   By: Charlett Nose   On: 08/03/2013 13:16    CBC  Recent Labs Lab 08/12/13 0555 08/13/13 0630 08/13/13 1240 08/15/13 0425  08/16/13 0525 08/17/13 0443 08/18/13 0255  WBC 7.6 7.4 7.5 7.9 8.2 8.5 8.3  HGB 8.7* 8.7* 9.2* 8.3* 9.3* 8.2* 8.0*  HCT 28.2* 28.8* 30.3* 27.7* 31.6* 27.2* 26.3*  PLT 263 250 265 246 271 270 244  MCV 79.4 80.7 80.6 81.2 81.4 81.9 81.4  MCH 24.5* 24.4* 24.5* 24.3* 24.0* 24.7* 24.8*  MCHC 30.9 30.2 30.4 30.0 29.4* 30.1 30.4  RDW 21.2* 22.1* 22.2* 23.3* 23.4* 23.5* 23.5*  LYMPHSABS 0.9 1.0  --  1.2  --   --   --   MONOABS 0.5 0.4  --  0.4  --   --   --   EOSABS 0.3 0.2  --  0.3  --   --   --   BASOSABS 0.0 0.0  --  0.0  --   --   --     Chemistries   Recent Labs Lab 08/13/13 0630 08/15/13 0425 08/16/13 0525 08/17/13 0443 08/18/13 0255  NA 139 140 139 142 141  K 5.3* 5.1 5.5* 4.2 4.3  CL 104 106 106 109 109  CO2 28 25 23 24 21   GLUCOSE 99 85 101* 134* 107*  BUN 38* 48* 43* 41* 43*  CREATININE 2.61* 2.59* 2.34* 2.63* 2.75*  CALCIUM 8.9 8.8 9.2 8.4 8.4   ------------------------------------------------------------------------------------------------------------------  estimated creatinine clearance is 26.3 ml/min (by C-G formula based on Cr of 2.75). ------------------------------------------------------------------------------------------------------------------ No results found for this basename: HGBA1C,  in the last 72 hours ------------------------------------------------------------------------------------------------------------------ No results found for this basename: CHOL, HDL, LDLCALC, TRIG, CHOLHDL, LDLDIRECT,  in the last 72 hours ------------------------------------------------------------------------------------------------------------------ No results found for this basename: TSH, T4TOTAL, FREET3, T3FREE, THYROIDAB,  in the last 72 hours ------------------------------------------------------------------------------------------------------------------ No results found for this basename: VITAMINB12, FOLATE, FERRITIN, TIBC, IRON, RETICCTPCT,  in the last 72  hours  Coagulation profile  Recent Labs Lab 08/14/13 0550 08/15/13 0425 08/16/13 0525 08/17/13 0443 08/18/13 0255  INR 1.69* 1.43 1.19 1.15 1.19    No results found for this basename: DDIMER,  in the last 72 hours  Cardiac Enzymes No results found for this basename: CK, CKMB, TROPONINI, MYOGLOBIN,  in the last 168 hours ------------------------------------------------------------------------------------------------------------------ No components found with this basename: POCBNP,

## 2013-08-18 NOTE — Progress Notes (Signed)
ANTICOAGULATION CONSULT NOTE - Follow Up Consult  Pharmacy Consult for Lovenox + warfarin Indication: atrial fibrillation  Allergies  Allergen Reactions  . Daptomycin Other (See Comments)    Elevated CPK  . Morphine And Related Nausea And Vomiting  . Cephalexin Rash    unknown  . Celecoxib     unknown  . Codeine     unknown  . Fluoxetine Hcl     unknown  . Latex     REACTION: undefined  . Ofloxacin     unknown  . Penicillins     unknown  . Rofecoxib     unknown  . Sulfonamide Derivatives     unknown    Patient Measurements: Height: 5\' 3"  (160 cm) Weight: 266 lb (120.657 kg) IBW/kg (Calculated) : 52.4   Vital Signs: Temp: 97.5 F (36.4 C) (09/10 0445) Temp src: Oral (09/10 0445) BP: 161/49 mmHg (09/10 0445) Pulse Rate: 57 (09/10 0445)  Labs:  Recent Labs  08/16/13 0525 08/17/13 0443 08/17/13 1706 08/18/13 0255  HGB 9.3* 8.2*  --  8.0*  HCT 31.6* 27.2*  --  26.3*  PLT 271 270  --  244  LABPROT 14.8 14.5  --  14.8  INR 1.19 1.15  --  1.19  HEPARINUNFRC  --   --  <0.10*  --   CREATININE 2.34* 2.63*  --  2.75*    Estimated Creatinine Clearance: 26.3 ml/min (by C-G formula based on Cr of 2.75).   Assessment: [name] who was on warfarin PTA for AFib (10mg  daily) admitted with INR 2.77. Warfarin held for colonoscopy with her last dose 9/1. Now back on full dose anticoagulation. Her first HL was low last evening, and she was re-bolused and rate was increased to 1500units/hr. This morning during progression rounds, Dr. Leta Speller requested I transition to Lovenox. INR today 1.19. Hgb low but stable over the past week or so, platelets WNL. No bleeding noted.  Goal of Therapy:  INR 2-3 Heparin level 0.3-0.7 units/ml Monitor platelets by anticoagulation protocol: Yes   Plan:  1. Discontinue heparin at 1100 today 2. Start Lovenox 120mg  subq Q24 hr at 1200 3. Warfarin 12.5mg  po x1 tonight 4. Daily PT/INR 5. Follow for any signs of bleeding   Pradyun Ishman D.  Amor Hyle, PharmD Clinical Pharmacist Pager: [phone] 08/18/2013 8:37 AM

## 2013-08-18 NOTE — Progress Notes (Signed)
Physical Therapy Treatment Patient Details Name: [name] MRN: [id number] DOB: 1951/06/23 Today's Date: 08/18/2013 Time: 4098-1191 PT Time Calculation (min): 29 min  PT Assessment / Plan / Recommendation  History of Present Illness Admitted with SOB and L LE pain (chronic).  Hx of anemia, morbid obesity, CHF, OSA, DM, HTN.  Was hospitalized approximately 1 month ago. Trying to figure out if there is anything else they can do for her knee.   PT Comments   Pt has progressed well in past few days, is at supervision level with mobility, approaching mod I. Agree at this time that she is appropriate for HHPT at d/c which is also her desire. She becomes very winded with ambulation but is ambulating on RA and O2 sats quickly rebounded from 84% to 98% with 30 sec standing rest. PT will continue to follow.   Follow Up Recommendations  Home health PT;Supervision for mobility/OOB     Does the patient have the potential to tolerate intense rehabilitation     Barriers to Discharge        Equipment Recommendations  None recommended by PT;Other (comment) (see OT note)    Recommendations for Other Services    Frequency Min 3X/week   Progress towards PT Goals Progress towards PT goals: Progressing toward goals  Plan Current plan remains appropriate    Precautions / Restrictions Precautions Precautions: Fall Restrictions Weight Bearing Restrictions: No   Pertinent Vitals/Pain No c/o pain    Mobility  Bed Mobility Bed Mobility: Not assessed Transfers Transfers: Sit to Stand;Stand to Sit;Stand Pivot Transfers Sit to Stand: 5: Supervision;From chair/3-in-1;With upper extremity assist Stand to Sit: 5: Supervision;To chair/3-in-1;With upper extremity assist Stand Pivot Transfers: 5: Supervision Details for Transfer Assistance: pt able to transfer chair to [facility] without physical assist, is performing transfers safely Ambulation/Gait Ambulation/Gait Assistance: 5: Supervision Ambulation  Distance (Feet): 150 Feet Assistive device: Rolling walker Ambulation/Gait Assistance Details: pt becomes winded with ambulation, especially when trying to verbalize simultaneously. O2 sats 84% up to 98% with 30 sec rest. Discussed use of O2 sat monitor at home. vc's for erect posture and looking out ahead instead of at feet. Pt very motivated in session today to ambulate. Gait Pattern: Step-through pattern;Decreased stride length;Wide base of support Gait velocity: decreased General Gait Details: pt ambulated on RA Stairs: No Wheelchair Mobility Wheelchair Mobility: No    Exercises     PT Diagnosis:    PT Problem List:   PT Treatment Interventions:     PT Goals (current goals can now be found in the care plan section) Acute Rehab PT Goals Patient Stated Goal: return home PT Goal Formulation: With patient Time For Goal Achievement: 08/25/13 Potential to Achieve Goals: Good  Visit Information  Last PT Received On: 08/18/13 Assistance Needed: +1 PT/OT Co-Evaluation/Treatment: Yes History of Present Illness: Admitted with SOB and L LE pain (chronic).  Hx of anemia, morbid obesity, CHF, OSA, DM, HTN.  Was hospitalized approximately 1 month ago. Trying to figure out if there is anything else they can do for her knee.    Subjective Data  Subjective: pt unhappy because it was recommended that she go to SNF and she is not in agreement with this recommendation and says she has improved greatly Patient Stated Goal: return home   Cognition  Cognition Arousal/Alertness: Awake/alert Behavior During Therapy: WFL for tasks assessed/performed Overall Cognitive Status: Within Functional Limits for tasks assessed    Balance  Balance Balance Assessed: Yes Dynamic Standing Balance Dynamic Standing -  Balance Support: Right upper extremity supported;During functional activity Dynamic Standing - Level of Assistance: 5: Stand by assistance  End of Session PT - End of Session Equipment Utilized  During Treatment: Gait belt Activity Tolerance: Patient tolerated treatment well Patient left: in chair;with call bell/phone within reach Nurse Communication: Mobility status   GP   Lyanne Co, PT  Acute Rehab Services  [phone]   Lyanne Co 08/18/2013, 3:05 PM

## 2013-08-19 LAB — BASIC METABOLIC PANEL
CO2: 19 mEq/L (ref 19–32)
Chloride: 109 mEq/L (ref 96–112)
Glucose, Bld: 80 mg/dL (ref 70–99)
Potassium: 4.6 mEq/L (ref 3.5–5.1)
Sodium: 139 mEq/L (ref 135–145)

## 2013-08-19 LAB — PROTIME-INR
INR: 1.26 (ref 0.00–1.49)
Prothrombin Time: 15.5 seconds — ABNORMAL HIGH (ref 11.6–15.2)

## 2013-08-19 LAB — GLUCOSE, CAPILLARY
Glucose-Capillary: 81 mg/dL (ref 70–99)
Glucose-Capillary: 94 mg/dL (ref 70–99)

## 2013-08-19 LAB — CBC
Hemoglobin: 8.1 g/dL — ABNORMAL LOW (ref 12.0–15.0)
RBC: 3.28 MIL/uL — ABNORMAL LOW (ref 3.87–5.11)

## 2013-08-19 MED ORDER — AMLODIPINE BESYLATE 10 MG PO TABS: 10.0000 mg | ORAL_TABLET | Freq: Every day | ORAL | Status: DC

## 2013-08-19 MED ORDER — ENOXAPARIN SODIUM 120 MG/0.8ML ~~LOC~~ SOLN: 120.0000 mg | SUBCUTANEOUS | Status: DC

## 2013-08-19 MED ORDER — FUROSEMIDE 40 MG PO TABS: 40.0000 mg | ORAL_TABLET | Freq: Every day | ORAL | Status: DC

## 2013-08-19 MED ORDER — WARFARIN SODIUM 10 MG PO TABS
10.0000 mg | ORAL_TABLET | Freq: Every day | ORAL | Status: DC
  Filled 2013-08-19: qty 1

## 2013-08-19 MED ORDER — OXYCODONE-ACETAMINOPHEN 5-325 MG PO TABS: 1.0000 | ORAL_TABLET | Freq: Three times a day (TID) | ORAL | Status: DC | PRN

## 2013-08-19 NOTE — Progress Notes (Signed)
ANTICOAGULATION CONSULT NOTE - Follow Up Consult  Pharmacy Consult for Lovenox + warfarin Indication: atrial fibrillation  Allergies  Allergen Reactions  . Daptomycin Other (See Comments)    Elevated CPK  . Morphine And Related Nausea And Vomiting  . Cephalexin Rash    unknown  . Celecoxib     unknown  . Codeine     unknown  . Fluoxetine Hcl     unknown  . Latex     REACTION: undefined  . Ofloxacin     unknown  . Penicillins     unknown  . Rofecoxib     unknown  . Sulfonamide Derivatives     unknown    Patient Measurements: Height: 5\' 3"  (160 cm) Weight: 266 lb (120.657 kg) IBW/kg (Calculated) : 52.4   Vital Signs: Temp: 98.1 F (36.7 C) (09/11 0530) Temp src: Oral (09/11 0530) BP: 138/42 mmHg (09/11 0530) Pulse Rate: 55 (09/11 0530)  Labs:  Recent Labs  08/17/13 0443 08/17/13 1706 08/18/13 0255 08/19/13 0555  HGB 8.2*  --  8.0* 8.1*  HCT 27.2*  --  26.3* 26.8*  PLT 270  --  244 225  LABPROT 14.5  --  14.8 15.5*  INR 1.15  --  1.19 1.26  HEPARINUNFRC  --  <0.10*  --   --   CREATININE 2.63*  --  2.75* 2.49*    Estimated Creatinine Clearance: 29.1 ml/min (by C-G formula based on Cr of 2.49).   Assessment: 72 YOF who was on warfarin PTA for AFib (10mg  daily) admitted with INR 2.77. Warfarin held for colonoscopy with her last dose 9/1. She has now transitioned to full dose Lovenox and warfarin. INR today 1.26. Hgb low but stable over the past week or so, platelets WNL. No bleeding noted. Observed her receive Lovenox teaching yesterday and asked if she had any additional questions about Lovenox or warfarin- she stated she understood her instructions. Likely discharge today.  Goal of Therapy:  INR 2-3 Heparin level 0.3-0.7 units/ml Monitor platelets by anticoagulation protocol: Yes   Plan:  1. Recommend warfarin 10mg  po daily with INR check on either Monday 9/15 or Tuesday 9/16 2. Continue Lovenox 120mg  subq daily- needs to continue until she has  her INR check as an outpatient   Exzavier Ruderman D. Bobbi Kozakiewicz, PharmD Clinical Pharmacist Pager: [phone] 08/19/2013 10:24 AM

## 2013-08-19 NOTE — Discharge Summary (Signed)
[facility]                                                                                   [name], is a 62 y.o. female  DOB Sep 29, 1951  MRN [id number].  Admission date:  08/10/2013  Admitting Physician  No admitting provider for patient encounter.  Discharge Date:  08/19/2013   Primary MD  [name] [name], MD  Admission Diagnosis  GI bleed [578.9]  Discharge Diagnosis     Principal Problem:   Bright red rectal bleeding Active Problems:   HYPERLIPIDEMIA   OBSTRUCTIVE SLEEP APNEA   HYPERTENSION   Acute combined systolic and diastolic heart failure   C V A / STROKE   ASTHMA   Obesity hypoventilation syndrome   Prosthetic joint infection   DM (diabetes mellitus)   Chronic anticoagulation   Chronic venous insufficiency   CKD (chronic kidney disease), stage IV   Anemia of chronic disease   PAF (paroxysmal atrial fibrillation)   Morbid obesity   Bleeding into the skin   Hyperkalemia   Acute-on-chronic kidney injury      Past Medical History  Diagnosis Date  . Asthma   . Bronchitis   . Allergic rhinitis   . HTN (hypertension)   . Hyperlipidemia   . Obesity   . OSA (obstructive sleep apnea)     poor compliance with cpap  . CHF (congestive heart failure)   . Biventricular failure     compensated  . Psoriasis   . Stasis edema     of lower extremities  . Depression   . Situational stress   . Kidney stones   . Kidney disease, chronic, stage III (GFR 30-59 ml/min)   . Anemia   . Gout   . Chronic anticoagulation   . Yeast infection involving the vagina and surrounding area   . Atrial fibrillation with RVR   . GERD (gastroesophageal reflux disease)   . Chronic bronchitis     "get it q yr" (08/10/2013)  . Pneumonia     "said I did on 08/03/2013 then they ruled it out" (08/10/2013)  . Shortness of breath     "all the time" (08/10/2013)  . On home oxygen therapy     "2L during the night and prn during the day" (08/10/2013)  . Type II diabetes mellitus   .  History of blood transfusion     "few during my lifetime; last time was 4 days ago when I got 2 units" (08/10/2013)  . Stroke ?2007    denies residuals on 08/10/2013  . DJD (degenerative joint disease)   . Bleeding     "from my skin folds; just won't stop" (08/10/2013)  . Complication of anesthesia     "they gave me too much; I stayed out of it for awhile" (08/10/2013)    Past Surgical History  Procedure Laterality Date  . Appendectomy    . Cholecystectomy    . Total knee arthroplasty Left ~ 2006  . Total abdominal hysterectomy    . Back surgery    . Cystectomy Left     hand  . Tonsillectomy    . Joint replacement    .  Lumbar disc surgery    . Colonoscopy N/A 08/16/2013    Procedure: COLONOSCOPY;  Surgeon: Theda Belfast, MD;  Location: [facility] ENDOSCOPY;  Service: Endoscopy;  Laterality: N/A;     Recommendations for primary care physician for things to follow:   Monitor weight, BMP and diuretic dose closely. Please monitor INR closely and stop Lovenox once INR is 2. Adjust her Coumadin dose as she might require around 7.5 mg of Coumadin on a daily basis once INR is therapeutic   Discharge Diagnoses:   Principal Problem:   Bright red rectal bleeding Active Problems:   HYPERLIPIDEMIA   OBSTRUCTIVE SLEEP APNEA   HYPERTENSION   Acute combined systolic and diastolic heart failure   C V A / STROKE   ASTHMA   Obesity hypoventilation syndrome   Prosthetic joint infection   DM (diabetes mellitus)   Chronic anticoagulation   Chronic venous insufficiency   CKD (chronic kidney disease), stage IV   Anemia of chronic disease   PAF (paroxysmal atrial fibrillation)   Morbid obesity   Bleeding into the skin   Hyperkalemia   Acute-on-chronic kidney injury    Discharge Condition: stable   Discharge Medications     Medication List    STOP taking these medications       spironolactone 25 MG tablet  Commonly known as:  ALDACTONE      TAKE these medications       albuterol 108  (90 BASE) MCG/ACT inhaler  Commonly known as:  PROVENTIL HFA;VENTOLIN HFA  Inhale 2 puffs into the lungs every 6 (six) hours as needed for wheezing or shortness of breath.     allopurinol 100 MG tablet  Commonly known as:  ZYLOPRIM  Take 100 mg by mouth daily.     amiodarone 200 MG tablet  Commonly known as:  PACERONE  Take 200 mg by mouth daily.     amLODipine 10 MG tablet  Commonly known as:  NORVASC  Take 1 tablet (10 mg total) by mouth daily.     atorvastatin 20 MG tablet  Commonly known as:  LIPITOR  Take 20 mg by mouth at bedtime.     enoxaparin 120 MG/0.8ML injection  Commonly known as:  LOVENOX  Inject 0.8 mLs (120 mg total) into the skin daily. Stop once INR is 2     ferrous sulfate 325 (65 FE) MG tablet  Take 325 mg by mouth 3 (three) times daily with meals.     furosemide 40 MG tablet  Commonly known as:  LASIX  Take 1 tablet (40 mg total) by mouth daily.     hydrALAZINE 50 MG tablet  Commonly known as:  APRESOLINE  Take 50 mg by mouth 3 (three) times daily.     insulin aspart 100 UNIT/ML injection  Commonly known as:  novoLOG  Inject 0-20 Units into the skin 3 (three) times daily before meals. Per sliding scale     insulin glargine 100 UNIT/ML injection  Commonly known as:  LANTUS  Inject 20 Units into the skin 2 (two) times daily.     levofloxacin 250 MG tablet  Commonly known as:  LEVAQUIN  Take 250 mg by mouth daily.     metoprolol tartrate 25 MG tablet  Commonly known as:  LOPRESSOR  Take 1 tablet (25 mg total) by mouth 2 (two) times daily.     ondansetron 4 MG tablet  Commonly known as:  ZOFRAN  Take 1 tablet (4 mg total) by mouth every 6 (  six) hours as needed for nausea.     oxyCODONE-acetaminophen 5-325 MG per tablet  Commonly known as:  PERCOCET/ROXICET  Take 1 tablet by mouth every 8 (eight) hours as needed.     pantoprazole 40 MG tablet  Commonly known as:  PROTONIX  Take 40 mg by mouth daily.     warfarin 10 MG tablet  Commonly  known as:  COUMADIN  Take 10 mg by mouth daily.     zolpidem 5 MG tablet  Commonly known as:  AMBIEN  Take 5 mg by mouth at bedtime as needed for sleep.         Diet and Activity recommendation: See Discharge Instructions below   Discharge Instructions     Follow with Primary MD [name], [name], MD in 1 days   Get CBC, CMP, INR checked 1 days by Primary MD and again as instructed by your Primary MD.   Get Medicines reviewed and adjusted.  Please request your Prim.MD to go over all Hospital Tests and Procedure/Radiological results at the follow up, please get all Hospital records sent to your Prim MD by signing hospital release before you go home.  Activity: As tolerated with Full fall precautions use walker/cane & assistance as needed   Diet:  Heart healthy - Low carb  Accuchecks 4 times/day, Once in AM empty stomach and then before each meal. Log in all results and show them to your Prim.MD in 3 days. If any glucose reading is under 80 or above 300 call your Prim MD immidiately. Follow Low glucose instructions for glucose under 80 as instructed.   For Heart failure patients - Check your Weight same time everyday, if you gain over 2 pounds, or you develop in leg swelling, experience more shortness of breath or chest pain, call your Primary MD immediately. Follow Cardiac Low Salt Diet and 1.8 lit/day fluid restriction.  Disposition Home   If you experience worsening of your admission symptoms, develop shortness of breath, life threatening emergency, suicidal or homicidal thoughts you must seek medical attention immediately by calling 911 or calling your MD immediately  if symptoms less severe.  You Must read complete instructions/literature along with all the possible adverse reactions/side effects for all the Medicines you take and that have been prescribed to you. Take any new Medicines after you have completely understood and accpet all the possible adverse reactions/side  effects.   Do not drive and provide baby sitting services if your were admitted for syncope or siezures until you have seen by Primary MD or a Neurologist and advised to do so again.  Do not drive when taking Pain medications.    Do not take more than prescribed Pain, Sleep and Anxiety Medications  Special Instructions: If you have smoked or chewed Tobacco  in the last 2 yrs please stop smoking, stop any regular Alcohol  and or any Recreational drug use.  Wear Seat belts while driving.   Please note  You were cared for by a hospitalist during your hospital stay. If you have any questions about your discharge medications or the care you received while you were in the hospital after you are discharged, you can call the unit and asked to speak with the hospitalist on call if the hospitalist that took care of you is not available. Once you are discharged, your primary care physician will handle any further medical issues. Please note that NO REFILLS for any discharge medications will be authorized once you are discharged, as it is  imperative that you return to your primary care physician (or establish a relationship with a primary care physician if you do not have one) for your aftercare needs so that they can reassess your need for medications and monitor your lab values.   Follow-up Information   Follow up with [name], [name], MD. Schedule an appointment as soon as possible for a visit in 1 day.   Specialty:  Internal Medicine   Contact information:   [facility] [address] [place] [address] [phone]       Follow up with Kathryne Hitch, MD. Schedule an appointment as soon as possible for a visit in 1 week.   Specialty:  Orthopedic Surgery   Contact information:   [address] [place] [address] [phone]       Follow up with Robynn Pane, MD. Schedule an appointment as soon as possible for a visit in 1 week.   Specialty:   Cardiology   Contact information:   [address] [place] [address] [phone]       Follow up with Acey Lav, MD. Schedule an appointment as soon as possible for a visit in 1 week.   Specialty:  Infectious Diseases   Contact information:   [address] [place] [address] [phone]       Follow up with HUNG,PATRICK D, MD In 1 week.   Specialty:  Gastroenterology   Contact information:   [address] [place] [address] [phone]         Consults GI Dr. Elnoria Howard doing colonoscopy.    History of present illness and  Hospital Course:     Kindly see H&P for history of present illness and admission details, please review complete Labs, Consult reports and Test reports for all details in brief LAURRIE TOPPIN, is a 62 y.o. female, patient with history of Diverticular bleed in the past, atrial fibrillation on Coumadin, chronic kidney disease stage IV baseline creatinine 2.5, morbid obesity, obstructive sleep apnea noncompliant with CPAP,Anemia of chronic disease,Hypertension, stable chronic systolic and diastolic heart function last EF 45% follows with Dr. Tomma Rakers joint infection patient follows with Dr. Magnus Ivan and Dr. Paulette Blanch Dam for this problem and is on chronic Levaquin suppressive treatment, diabetes mellitus type 2 In poor control A1c 7.    Patient with above history was admitted to the hospital with bright red blood per rectum which was painless and likely due to diverticular bleed In the setting of supratherapeutic INR, She required 2 units of FFP Along with colonoscopy by Dr. Luisa Hart hung which revealed diverticula without any acute bleeding, She did develop anemia due to acute blood loss on top of anemia of chronic disease and required 2 unit of packed RBC transfusion. Her bleeding has since resolved and H&H has remained stable. Since patient has paroxysmal atrial fibrillation  with [place] score of over 5 her Coumadin has been resumed with Lovenox overlap, I will request her to follow with her primary care physician in one day and get her INR along with CBC BMP checked, once INR reaches 2 her Lovenox should be stopped. She will follow with her cardiologist Dr. Sharyn Lull and GI physician Dr. Elnoria Howard post discharge.   Please note patient used to be on 5 mg of Coumadin earlier and it was changed to 10 mg recently, once INR is therapeutic I suspect her adjusted Coumadin dose could be close to 7.5 on a  daily basis with close INR monitoring.   For her proximal atrial fibrillation goal will be to continue her home medications which include amiodarone and beta blocker along with Coumadin and Lovenox as dictated above. She will follow with her cardiologist Dr. Sharyn Lull in a week.    She has chronic kidney disease stage V baseline creatinine is 2.5 she did have evidence of acute renal insufficiency upon admission with her Lasix being on hold for few days she has come back to baseline upon discharge him reducing her Lasix dose from 80 mg a day to 40 mg daily along with her home dose Aldactone, we'll request PCP to monitor her weight, BMP closely and adjust diuretics as needed.   Note patient has morbid obesity and obstructive sleep apnea however despite multiple sessions of counseling she remains noncompliant with CPAP.   For her  diabetes mellitus type 2 she will resume her home regimen of insulin unchanged. Will defer monitoring of her A1c and adjusting of her diabetic medications to her PCP. Her A1c here was 7.   She is compensated from chronic systolic and diastolic CHF standpoint.    For her chronic prosthetic joint infection she will resume her Chronic oral Levaquin for her chronic prosthetic joint infection suppression.I have requested her to follow with her ID and orthopedic physician post discharge.     Today   Subjective:   Lucylle Foulkes today has no headache,no  chest abdominal pain,no new weakness tingling or numbness, feels much better wants to go home today.   Objective:   Blood pressure 138/42, pulse 55, temperature 98.1 F (36.7 C), temperature source Oral, resp. rate 18, height 5\' 3"  (1.6 m), weight 120.657 kg (266 lb), SpO2 100.00%.   Intake/Output Summary (Last 24 hours) at 08/19/13 1156 Last data filed at 08/19/13 0844  Gross per 24 hour  Intake   1320 ml  Output      1 ml  Net   1319 ml    Exam Awake Alert, Oriented *3, No new F.N deficits, Normal affect [place].AT,PERRAL Supple Neck,No JVD, No cervical lymphadenopathy appriciated.  Symmetrical Chest wall movement, Good air movement bilaterally, CTAB RRR,No Gallops,Rubs or new Murmurs, No Parasternal Heave +ve B.Sounds, Abd Soft, Non tender, No organomegaly appriciated, No rebound -guarding or rigidity. No Cyanosis, Clubbing or edema, No new Rash or bruise  Data Review   Major procedures and Radiology Reports - PLEASE review detailed and final reports for all details, in brief -       Dg Chest 1 View  08/03/2013   CLINICAL DATA:  shortness of breath, weakness. Left knee swelling.  EXAM: CHEST - 1 VIEW  COMPARISON:  07/05/2013  FINDINGS: Mild cardiomegaly with vascular congestion. Mild right infrahilar airspace opacity, atelectasis versus infiltrate. No confluent opacity on the left. No visible effusions. No overt edema or acute bony abnormality.  IMPRESSION: Cardiomegaly, vascular congestion.  Right infrahilar atelectasis or infiltrate.   Electronically Signed   By: Charlett Nose   On: 08/03/2013 13:14   US Renal  08/12/2013   *RADIOLOGY REPORT*  Clinical Data: Acute kidney injury.  RENAL/URINARY TRACT ULTRASOUND COMPLETE  Comparison:  01/23/2012.  Findings:  Right Kidney:  9 cm in length.  No hydronephrosis.  Diffuse increased renal echogenicity.  There are two dominant cysts, also seen previously, located in the interpolar and lower pole regions. The largest is in the lower pole, 1.7  cm in diameter.  Left Kidney:  11 cm in length.  No hydronephrosis.  Renal  echogenicity more normal in appearance.  No focal abnormality.  Bladder:  98 ml pre void.  No focal wall thickening or filling defect.  IMPRESSION:  1.  No hydronephrosis. 2.  Medical renal disease on the right, with atrophy.   Original Report Authenticated By: Tiburcio Pea   Dg Knee Complete 4 Views Left  08/03/2013   CLINICAL DATA:  Shortness of breath, weakness.  EXAM: LEFT KNEE - COMPLETE 4+ VIEW  COMPARISON:  06/09/2012  FINDINGS: Changes of left knee replacement. No joint effusion. No acute fracture, subluxation or dislocation.  IMPRESSION: Left knee replacement. No complicating feature.   Electronically Signed   By: Charlett Nose   On: 08/03/2013 13:16    Micro Results      Recent Results (from the past 240 hour(s))  MRSA PCR SCREENING     Status: None   Collection Time    08/10/13  3:38 PM      Result Value Range Status   MRSA by PCR NEGATIVE  NEGATIVE Final   Comment:            The GeneXpert MRSA Assay (FDA     approved for NASAL specimens     only), is one component of a     comprehensive MRSA colonization     surveillance program. It is not     intended to diagnose MRSA     infection nor to guide or     monitor treatment for     MRSA infections.     DELTA CHECK NOTED     CBC w Diff: Lab Results  Component Value Date   WBC 6.6 08/19/2013   HGB 8.1* 08/19/2013   HCT 26.8* 08/19/2013   PLT 225 08/19/2013   LYMPHOPCT 15 08/15/2013   MONOPCT 5 08/15/2013   EOSPCT 3 08/15/2013   BASOPCT 0 08/15/2013    CMP: Lab Results  Component Value Date   NA 139 08/19/2013   K 4.6 08/19/2013   CL 109 08/19/2013   CO2 19 08/19/2013   BUN 44* 08/19/2013   CREATININE 2.49* 08/19/2013   CREATININE 2.65* 07/01/2013   PROT 7.4 07/06/2013   ALBUMIN 2.6* 07/06/2013   BILITOT 0.2* 07/06/2013   ALKPHOS 101 07/06/2013   AST 11 07/06/2013   ALT 8 07/06/2013  .   Total Time in preparing paper work, data evaluation and todays  exam - 35 minutes  Leroy Sea M.D on 08/19/2013 at 11:56 AM  [facility] Office  [phone]

## 2013-09-02 ENCOUNTER — Encounter: Payer: Self-pay | Admitting: Podiatry

## 2013-09-02 DIAGNOSIS — B351 Tinea unguium: Secondary | ICD-10-CM

## 2013-09-08 ENCOUNTER — Encounter (HOSPITAL_COMMUNITY): Payer: Self-pay | Admitting: Emergency Medicine

## 2013-09-08 ENCOUNTER — Inpatient Hospital Stay (HOSPITAL_COMMUNITY)
Admission: EM | Admit: 2013-09-08 | Discharge: 2013-09-22 | DRG: 463 | Disposition: A | Discharge status: Skilled Nursing Facility | Payer: Medicare HMO | Attending: Family Medicine | Admitting: Family Medicine

## 2013-09-08 DIAGNOSIS — R0602 Shortness of breath: Secondary | ICD-10-CM

## 2013-09-08 DIAGNOSIS — I5031 Acute diastolic (congestive) heart failure: Secondary | ICD-10-CM

## 2013-09-08 DIAGNOSIS — D500 Iron deficiency anemia secondary to blood loss (chronic): Secondary | ICD-10-CM | POA: Diagnosis present

## 2013-09-08 DIAGNOSIS — F329 Major depressive disorder, single episode, unspecified: Secondary | ICD-10-CM | POA: Diagnosis present

## 2013-09-08 DIAGNOSIS — I4891 Unspecified atrial fibrillation: Secondary | ICD-10-CM

## 2013-09-08 DIAGNOSIS — F3289 Other specified depressive episodes: Secondary | ICD-10-CM | POA: Diagnosis present

## 2013-09-08 DIAGNOSIS — D62 Acute posthemorrhagic anemia: Secondary | ICD-10-CM | POA: Diagnosis present

## 2013-09-08 DIAGNOSIS — N184 Chronic kidney disease, stage 4 (severe): Secondary | ICD-10-CM | POA: Diagnosis present

## 2013-09-08 DIAGNOSIS — E119 Type 2 diabetes mellitus without complications: Secondary | ICD-10-CM

## 2013-09-08 DIAGNOSIS — I10 Essential (primary) hypertension: Secondary | ICD-10-CM | POA: Diagnosis present

## 2013-09-08 DIAGNOSIS — Z79899 Other long term (current) drug therapy: Secondary | ICD-10-CM

## 2013-09-08 DIAGNOSIS — I872 Venous insufficiency (chronic) (peripheral): Secondary | ICD-10-CM | POA: Diagnosis present

## 2013-09-08 DIAGNOSIS — E1169 Type 2 diabetes mellitus with other specified complication: Secondary | ICD-10-CM | POA: Diagnosis present

## 2013-09-08 DIAGNOSIS — I5043 Acute on chronic combined systolic (congestive) and diastolic (congestive) heart failure: Secondary | ICD-10-CM

## 2013-09-08 DIAGNOSIS — J4489 Other specified chronic obstructive pulmonary disease: Secondary | ICD-10-CM | POA: Diagnosis present

## 2013-09-08 DIAGNOSIS — J309 Allergic rhinitis, unspecified: Secondary | ICD-10-CM | POA: Diagnosis present

## 2013-09-08 DIAGNOSIS — G4733 Obstructive sleep apnea (adult) (pediatric): Secondary | ICD-10-CM | POA: Diagnosis present

## 2013-09-08 DIAGNOSIS — J449 Chronic obstructive pulmonary disease, unspecified: Secondary | ICD-10-CM | POA: Diagnosis present

## 2013-09-08 DIAGNOSIS — R233 Spontaneous ecchymoses: Secondary | ICD-10-CM

## 2013-09-08 DIAGNOSIS — M109 Gout, unspecified: Secondary | ICD-10-CM | POA: Diagnosis present

## 2013-09-08 DIAGNOSIS — S8011XA Contusion of right lower leg, initial encounter: Secondary | ICD-10-CM

## 2013-09-08 DIAGNOSIS — Z794 Long term (current) use of insulin: Secondary | ICD-10-CM

## 2013-09-08 DIAGNOSIS — Z7901 Long term (current) use of anticoagulants: Secondary | ICD-10-CM

## 2013-09-08 DIAGNOSIS — E8770 Fluid overload, unspecified: Secondary | ICD-10-CM

## 2013-09-08 DIAGNOSIS — J9610 Chronic respiratory failure, unspecified whether with hypoxia or hypercapnia: Secondary | ICD-10-CM | POA: Diagnosis present

## 2013-09-08 DIAGNOSIS — M7981 Nontraumatic hematoma of soft tissue: Principal | ICD-10-CM | POA: Diagnosis present

## 2013-09-08 DIAGNOSIS — J45901 Unspecified asthma with (acute) exacerbation: Secondary | ICD-10-CM

## 2013-09-08 DIAGNOSIS — I5041 Acute combined systolic (congestive) and diastolic (congestive) heart failure: Secondary | ICD-10-CM

## 2013-09-08 DIAGNOSIS — Z8673 Personal history of transient ischemic attack (TIA), and cerebral infarction without residual deficits: Secondary | ICD-10-CM

## 2013-09-08 DIAGNOSIS — I129 Hypertensive chronic kidney disease with stage 1 through stage 4 chronic kidney disease, or unspecified chronic kidney disease: Secondary | ICD-10-CM | POA: Diagnosis present

## 2013-09-08 DIAGNOSIS — I480 Paroxysmal atrial fibrillation: Secondary | ICD-10-CM | POA: Diagnosis present

## 2013-09-08 DIAGNOSIS — D649 Anemia, unspecified: Secondary | ICD-10-CM

## 2013-09-08 DIAGNOSIS — D638 Anemia in other chronic diseases classified elsewhere: Secondary | ICD-10-CM | POA: Diagnosis present

## 2013-09-08 DIAGNOSIS — E785 Hyperlipidemia, unspecified: Secondary | ICD-10-CM | POA: Diagnosis present

## 2013-09-08 DIAGNOSIS — I5033 Acute on chronic diastolic (congestive) heart failure: Secondary | ICD-10-CM | POA: Diagnosis present

## 2013-09-08 DIAGNOSIS — Z96659 Presence of unspecified artificial knee joint: Secondary | ICD-10-CM

## 2013-09-08 DIAGNOSIS — I509 Heart failure, unspecified: Secondary | ICD-10-CM | POA: Diagnosis present

## 2013-09-08 DIAGNOSIS — Z87442 Personal history of urinary calculi: Secondary | ICD-10-CM

## 2013-09-08 DIAGNOSIS — A4902 Methicillin resistant Staphylococcus aureus infection, unspecified site: Secondary | ICD-10-CM | POA: Diagnosis present

## 2013-09-08 DIAGNOSIS — Z9981 Dependence on supplemental oxygen: Secondary | ICD-10-CM

## 2013-09-08 DIAGNOSIS — Z6841 Body Mass Index (BMI) 40.0 and over, adult: Secondary | ICD-10-CM

## 2013-09-08 DIAGNOSIS — K219 Gastro-esophageal reflux disease without esophagitis: Secondary | ICD-10-CM | POA: Diagnosis present

## 2013-09-08 DIAGNOSIS — E1129 Type 2 diabetes mellitus with other diabetic kidney complication: Secondary | ICD-10-CM | POA: Diagnosis present

## 2013-09-08 DIAGNOSIS — R791 Abnormal coagulation profile: Secondary | ICD-10-CM

## 2013-09-08 HISTORY — DX: Anxiety disorder, unspecified: F41.9

## 2013-09-08 LAB — GLUCOSE, CAPILLARY: Glucose-Capillary: 109 mg/dL — ABNORMAL HIGH (ref 70–99)

## 2013-09-08 MED ORDER — ONDANSETRON HCL 4 MG/2ML IJ SOLN
4.0000 mg | Freq: Once | INTRAMUSCULAR | Status: AC
Start: 2013-09-09 — End: 2013-09-09
  Administered 2013-09-09: 4 mg via INTRAVENOUS
  Filled 2013-09-08: qty 2

## 2013-09-08 MED ORDER — HYDROMORPHONE HCL PF 1 MG/ML IJ SOLN
1.0000 mg | Freq: Once | INTRAMUSCULAR | Status: AC
  Administered 2013-09-09: 1 mg via INTRAVENOUS
  Filled 2013-09-08: qty 1

## 2013-09-08 NOTE — Progress Notes (Signed)
[facility] PROGRESS NOTE  ATLEIGH GRUEN AVW:[id number] DOB: 04-May-1951 DOA: 08/10/2013 PCP: [name] [name], MD  Assessment/Plan: Bright red rectal bleeding  - Overnight stable - Hgb has remained stable at around 8.5 s/p 2 units prbc's (from 6.8 on admit) - Pt w/ blood clots per rectum noted by staff on 08/12/13 - Colonoscopy done, revealing diverticula and hemorrhoids w/o active bleed. Appreciate GI recs Bleeding into the skin  - skin oozing over abd much improved - s/p FFP AKI on CKD (chronic kidney disease), stage IV  - Baseline creatinine appears to be around 2.5. Creatinine has remained near baseline during this admission - Renal ultrasound with R medical renal disease w/ atrophy. - Will cont to hold Lasix for now. - No signs of volume overload Anemia of chronic disease  - Baseline hemoglobin around 8. PAF (paroxysmal atrial fibrillation)  - CHADS of 5 - will greatly benefit from anticoagulation - Discussed with GI, OK to resume full dose anticoagulation - Rate controlled at this time. Normally on Coumadin and metoprolol. Follows with Dr. Sharyn Lull . - Holding Coumadin secondary to recent bleeding.  - Would follow up with Dr Sharyn Lull upon discharge.  Hyperkalemia  - Held aldactone. Monitor in tele. - Resolved s/p kayexalate  OBSTRUCTIVE SLEEP APNEA  - Had recommended bedtime CPAP - cont to refuse  HYPERTENSION  - Resumed amlodipine and metoprolol.  - Stable Acute combined systolic and diastolic heart failure  EF of 82%. Does not appear in volume overload clinically.  Resumed Toprol.  Prosthetic joint infection  Follows with orthopedics Dr. Magnus Ivan and ID ( Dr Zenaida Niece dam) . Is on levaquin for suppressive abx.  DM (diabetes mellitus)  - Martha FSG and continue SSI. Will reduced dose of lantus (10units bid) - Suspect pt is non-compliant with diabetic diet at home Itching: - Improved with benadryl cream  Code Status: Full Family Communication:  Pt in room (indicate  person spoken with, relationship, and if by phone, the number) Disposition Plan: Pending home with HH/PT/OT  Consultants:  GI  Antibiotics:  Chronic Levaquin 08/10/13>>>  HPI/Subjective: No major events overnight. Tolerated colonoscopy yesterday  Objective: Filed Vitals:   08/18/13 1431 08/18/13 1617 08/18/13 1950 08/19/13 0530  BP: 150/43 142/40 155/46 138/42  Pulse: 51 56 60 55  Temp: 97.8 F (36.6 C)  98.1 F (36.7 C) 98.1 F (36.7 C)  TempSrc: Oral  Oral Oral  Resp: 20  20 18   Height:      Weight:      SpO2: 100%  98% 100%   No intake or output data in the 24 hours ending 09/08/13 1645 Filed Weights   08/10/13 1435  Weight: 120.657 kg (266 lb)    Exam:   General:  Awake, in nad  Cardiovascular: Regular, s1, s2  Respiratory: normal resp effort, no wheezing  Abdomen: sort, nondistended  Musculoskeletal: perfused, no clubbing   Data Reviewed: Basic Metabolic Panel: No results found for this basename: NA, K, CL, CO2, GLUCOSE, BUN, CREATININE, CALCIUM, MG, PHOS,  in the last 168 hours Liver Function Tests: No results found for this basename: AST, ALT, ALKPHOS, BILITOT, PROT, ALBUMIN,  in the last 168 hours No results found for this basename: LIPASE, AMYLASE,  in the last 168 hours No results found for this basename: AMMONIA,  in the last 168 hours CBC: No results found for this basename: WBC, NEUTROABS, HGB, HCT, MCV, PLT,  in the last 168 hours Cardiac Enzymes: No results found for this basename: CKTOTAL, CKMB, CKMBINDEX, TROPONINI,  in the last 168 hours BNP (last 3 results)  Recent Labs  02/15/13 0434 07/05/13 1536 08/03/13 1122  PROBNP 438.6* 1581.0* 1789.0*   CBG: No results found for this basename: GLUCAP,  in the last 168 hours  No results found for this or any previous visit (from the past 240 hour(s)).   Studies: No results found.  Scheduled Meds:  Continuous Infusions:   Principal Problem:   Bright red rectal bleeding Active  Problems:   HYPERLIPIDEMIA   OBSTRUCTIVE SLEEP APNEA   HYPERTENSION   Acute combined systolic and diastolic heart failure   C V A / STROKE   ASTHMA   Obesity hypoventilation syndrome   Prosthetic joint infection   DM (diabetes mellitus)   Chronic anticoagulation   Chronic venous insufficiency   CKD (chronic kidney disease), stage IV   Anemia of chronic disease   PAF (paroxysmal atrial fibrillation)   Morbid obesity   Bleeding into the skin   Hyperkalemia   Acute-on-chronic kidney injury  Time spent:  Jaryn Rosko K  [facility] Pager [phone]. If 7PM-7AM, please contact night-coverage at [email or web address], password [facility] 09/08/2013, 4:44 PM  LOS: 9 days

## 2013-09-08 NOTE — ED Notes (Signed)
Pt reports she believes she was bit about 3-4 hours ago.  Pt has a 4in diameter area of bruised swelling to her inner R lower leg.  Pt reports pain 10/10. No noted area of puncture.

## 2013-09-09 ENCOUNTER — Encounter (HOSPITAL_COMMUNITY): Payer: Self-pay

## 2013-09-09 ENCOUNTER — Emergency Department (HOSPITAL_COMMUNITY): Payer: Medicare HMO

## 2013-09-09 DIAGNOSIS — I4891 Unspecified atrial fibrillation: Secondary | ICD-10-CM

## 2013-09-09 DIAGNOSIS — E119 Type 2 diabetes mellitus without complications: Secondary | ICD-10-CM

## 2013-09-09 DIAGNOSIS — R233 Spontaneous ecchymoses: Secondary | ICD-10-CM | POA: Diagnosis present

## 2013-09-09 DIAGNOSIS — Z7901 Long term (current) use of anticoagulants: Secondary | ICD-10-CM

## 2013-09-09 DIAGNOSIS — R791 Abnormal coagulation profile: Secondary | ICD-10-CM

## 2013-09-09 DIAGNOSIS — N184 Chronic kidney disease, stage 4 (severe): Secondary | ICD-10-CM

## 2013-09-09 DIAGNOSIS — S8010XA Contusion of unspecified lower leg, initial encounter: Secondary | ICD-10-CM

## 2013-09-09 LAB — COMPREHENSIVE METABOLIC PANEL
AST: 11 U/L (ref 0–37)
Albumin: 2.1 g/dL — ABNORMAL LOW (ref 3.5–5.2)
Alkaline Phosphatase: 85 U/L (ref 39–117)
Calcium: 8 mg/dL — ABNORMAL LOW (ref 8.4–10.5)
Creatinine, Ser: 2.13 mg/dL — ABNORMAL HIGH (ref 0.50–1.10)
GFR calc Af Amer: 27 mL/min — ABNORMAL LOW (ref >90)

## 2013-09-09 LAB — PREPARE RBC (CROSSMATCH)

## 2013-09-09 LAB — CBC
HCT: 20.7 % — ABNORMAL LOW (ref 36.0–46.0)
Hemoglobin: 6.2 g/dL — CL (ref 12.0–15.0)
MCHC: 30 g/dL (ref 30.0–36.0)
RBC: 2.65 MIL/uL — ABNORMAL LOW (ref 3.87–5.11)
WBC: 10.3 10*3/uL (ref 4.0–10.5)

## 2013-09-09 LAB — PROTIME-INR
INR: 2.47 — ABNORMAL HIGH (ref 0.00–1.49)
INR: 1.59 — ABNORMAL HIGH (ref 0.00–1.49)
Prothrombin Time: 18.5 seconds — ABNORMAL HIGH (ref 11.6–15.2)
Prothrombin Time: 41.2 seconds — ABNORMAL HIGH (ref 11.6–15.2)

## 2013-09-09 LAB — CBC WITH DIFFERENTIAL/PLATELET
Basophils Absolute: 0 10*3/uL (ref 0.0–0.1)
Basophils Relative: 0 % (ref 0–1)
Eosinophils Absolute: 0.2 10*3/uL (ref 0.0–0.7)
Hemoglobin: 7.2 g/dL — ABNORMAL LOW (ref 12.0–15.0)
MCH: 23.9 pg — ABNORMAL LOW (ref 26.0–34.0)
MCHC: 30.6 g/dL (ref 30.0–36.0)
Monocytes Absolute: 0.5 10*3/uL (ref 0.1–1.0)
Neutrophils Relative %: 84 % — ABNORMAL HIGH (ref 43–77)
RDW: 21.6 % — ABNORMAL HIGH (ref 11.5–15.5)

## 2013-09-09 LAB — BASIC METABOLIC PANEL
CO2: 24 mEq/L (ref 19–32)
Chloride: 111 mEq/L (ref 96–112)
Creatinine, Ser: 2 mg/dL — ABNORMAL HIGH (ref 0.50–1.10)
GFR calc Af Amer: 29 mL/min — ABNORMAL LOW (ref >90)
Potassium: 4.5 mEq/L (ref 3.5–5.1)
Sodium: 142 mEq/L (ref 135–145)

## 2013-09-09 LAB — POCT I-STAT, CHEM 8
BUN: 34 mg/dL — ABNORMAL HIGH (ref 6–23)
Calcium, Ion: 1.11 mmol/L — ABNORMAL LOW (ref 1.13–1.30)
Chloride: 113 mEq/L — ABNORMAL HIGH (ref 96–112)
Glucose, Bld: 115 mg/dL — ABNORMAL HIGH (ref 70–99)
HCT: 23 % — ABNORMAL LOW (ref 36.0–46.0)

## 2013-09-09 LAB — MRSA PCR SCREENING: MRSA by PCR: POSITIVE — AB

## 2013-09-09 LAB — HEMOGLOBIN AND HEMATOCRIT, BLOOD: HCT: 18.1 % — ABNORMAL LOW (ref 36.0–46.0)

## 2013-09-09 MED ORDER — SODIUM CHLORIDE 0.9 % IJ SOLN
3.0000 mL | Freq: Two times a day (BID) | INTRAMUSCULAR | Status: DC
  Administered 2013-09-10 – 2013-09-12 (×5): 3 mL via INTRAVENOUS

## 2013-09-09 MED ORDER — KETOROLAC TROMETHAMINE 30 MG/ML IJ SOLN: 30.0000 mg | Freq: Once | INTRAMUSCULAR | Status: AC | PRN

## 2013-09-09 MED ORDER — PANTOPRAZOLE SODIUM 40 MG PO TBEC
40.0000 mg | DELAYED_RELEASE_TABLET | Freq: Every day | ORAL | Status: DC
  Administered 2013-09-09 – 2013-09-22 (×13): 40 mg via ORAL
  Filled 2013-09-09 (×14): qty 1

## 2013-09-09 MED ORDER — ATORVASTATIN CALCIUM 20 MG PO TABS
20.0000 mg | ORAL_TABLET | Freq: Every day | ORAL | Status: DC
  Administered 2013-09-09 – 2013-09-21 (×13): 20 mg via ORAL
  Filled 2013-09-09 (×14): qty 1

## 2013-09-09 MED ORDER — HYDROMORPHONE HCL PF 1 MG/ML IJ SOLN
1.0000 mg | Freq: Once | INTRAMUSCULAR | Status: AC
  Administered 2013-09-09: 1 mg via INTRAVENOUS
  Filled 2013-09-09: qty 1

## 2013-09-09 MED ORDER — SODIUM CHLORIDE 0.9 % IJ SOLN: 3.0000 mL | INTRAMUSCULAR | Status: DC | PRN

## 2013-09-09 MED ORDER — SODIUM CHLORIDE 0.9 % IV SOLN
250.0000 mL | INTRAVENOUS | Status: DC | PRN
  Administered 2013-09-19: 250 mL via INTRAVENOUS

## 2013-09-09 MED ORDER — SODIUM CHLORIDE 0.9 % IJ SOLN: 3.0000 mL | Freq: Two times a day (BID) | INTRAMUSCULAR | Status: DC

## 2013-09-09 MED ORDER — CHLORHEXIDINE GLUCONATE CLOTH 2 % EX PADS
6.0000 | MEDICATED_PAD | Freq: Every day | CUTANEOUS | Status: AC
Start: 2013-09-09 — End: 2013-09-13
  Administered 2013-09-09 – 2013-09-13 (×5): 6 via TOPICAL

## 2013-09-09 MED ORDER — ONDANSETRON HCL 4 MG PO TABS
4.0000 mg | ORAL_TABLET | Freq: Four times a day (QID) | ORAL | Status: DC | PRN
  Administered 2013-09-12 – 2013-09-13 (×3): 4 mg via ORAL
  Filled 2013-09-09 (×3): qty 1

## 2013-09-09 MED ORDER — LEVOFLOXACIN 250 MG PO TABS
250.0000 mg | ORAL_TABLET | Freq: Every day | ORAL | Status: DC
  Administered 2013-09-09 – 2013-09-11 (×3): 250 mg via ORAL
  Filled 2013-09-09 (×3): qty 1

## 2013-09-09 MED ORDER — AMIODARONE HCL 200 MG PO TABS
200.0000 mg | ORAL_TABLET | Freq: Every day | ORAL | Status: DC
  Administered 2013-09-09 – 2013-09-22 (×14): 200 mg via ORAL
  Filled 2013-09-09 (×14): qty 1

## 2013-09-09 MED ORDER — HYDRALAZINE HCL 50 MG PO TABS
50.0000 mg | ORAL_TABLET | Freq: Three times a day (TID) | ORAL | Status: DC
  Administered 2013-09-09 – 2013-09-12 (×9): 50 mg via ORAL
  Filled 2013-09-09 (×12): qty 1

## 2013-09-09 MED ORDER — VITAMIN K1 10 MG/ML IJ SOLN
10.0000 mg | Freq: Once | INTRAVENOUS | Status: AC
  Administered 2013-09-09: 10 mg via INTRAVENOUS
  Filled 2013-09-09: qty 1

## 2013-09-09 MED ORDER — HYDROMORPHONE HCL PF 2 MG/ML IJ SOLN
2.0000 mg | INTRAMUSCULAR | Status: DC | PRN
  Administered 2013-09-09 – 2013-09-20 (×9): 2 mg via INTRAVENOUS
  Filled 2013-09-09 (×9): qty 1

## 2013-09-09 MED ORDER — ONDANSETRON HCL 4 MG/2ML IJ SOLN
4.0000 mg | Freq: Four times a day (QID) | INTRAMUSCULAR | Status: DC | PRN
  Administered 2013-09-09 – 2013-09-22 (×9): 4 mg via INTRAVENOUS
  Filled 2013-09-09 (×9): qty 2

## 2013-09-09 MED ORDER — OXYCODONE-ACETAMINOPHEN 5-325 MG PO TABS
1.0000 | ORAL_TABLET | ORAL | Status: DC | PRN
  Administered 2013-09-09 – 2013-09-10 (×2): 1 via ORAL
  Filled 2013-09-09 (×2): qty 1

## 2013-09-09 MED ORDER — DIPHENHYDRAMINE HCL 50 MG/ML IJ SOLN
25.0000 mg | Freq: Once | INTRAMUSCULAR | Status: AC
  Administered 2013-09-09: 25 mg via INTRAVENOUS
  Filled 2013-09-09: qty 1

## 2013-09-09 MED ORDER — MUPIROCIN 2 % EX OINT
1.0000 "application " | TOPICAL_OINTMENT | Freq: Two times a day (BID) | CUTANEOUS | Status: AC
  Administered 2013-09-09 – 2013-09-13 (×10): 1 via NASAL
  Filled 2013-09-09 (×2): qty 22

## 2013-09-09 NOTE — ED Provider Notes (Signed)
CSN: [id number]     Arrival date & time 09/08/13  2248 History   First MD Initiated Contact with Patient 09/08/13 2336     Chief Complaint  Patient presents with  . Suspected Spider Bite    (Consider location/radiation/quality/duration/timing/severity/associated sxs/prior Treatment) HPI  62 year old morbidly obese female with multiple comorbidities including prior stroke, atrial fibrillation currently on Coumadin, chronic kidney disease, CHF, situational stress presents for evaluations of right calf pain. Patient reports this afternoon when she got up to use the bathroom she noticed an acute onset of sharp burning pain to her right calf. Pain is intense, 10 out of 10, nonradiating. She also noticed swelling and bruising to the affected area. She did not recall injuring it in any way. She denies having any insect bites. This has never happened before. No complaints of fever, throat swelling, chest pain, short of breath however this event has caused her to have great stress. No specific treatment tried.  Pt denies any medication changes.  She takes Levaquin chronically for her L knee infection.  She was admitted to hospital at the beginning of the month for BRBPR likely 2/2 diverticular bleed.  Was assessed by Dr. Elnoria Howard.  She denies any rectal pain or rectal bleeding.    Past Medical History  Diagnosis Date  . Asthma   . Bronchitis   . Allergic rhinitis   . HTN (hypertension)   . Hyperlipidemia   . Obesity   . OSA (obstructive sleep apnea)     poor compliance with cpap  . CHF (congestive heart failure)   . Biventricular failure     compensated  . Psoriasis   . Stasis edema     of lower extremities  . Depression   . Situational stress   . Kidney stones   . Kidney disease, chronic, stage III (GFR 30-59 ml/min)   . Anemia   . Gout   . Chronic anticoagulation   . Yeast infection involving the vagina and surrounding area   . Atrial fibrillation with RVR   . GERD (gastroesophageal reflux  disease)   . Chronic bronchitis     "get it q yr" (08/10/2013)  . Pneumonia     "said I did on 08/03/2013 then they ruled it out" (08/10/2013)  . Shortness of breath     "all the time" (08/10/2013)  . On home oxygen therapy     "2L during the night and prn during the day" (08/10/2013)  . Type II diabetes mellitus   . History of blood transfusion     "few during my lifetime; last time was 4 days ago when I got 2 units" (08/10/2013)  . Stroke ?2007    denies residuals on 08/10/2013  . DJD (degenerative joint disease)   . Bleeding     "from my skin folds; just won't stop" (08/10/2013)  . Complication of anesthesia     "they gave me too much; I stayed out of it for awhile" (08/10/2013)   Past Surgical History  Procedure Laterality Date  . Appendectomy    . Cholecystectomy    . Total knee arthroplasty Left ~ 2006  . Total abdominal hysterectomy    . Back surgery    . Cystectomy Left     hand  . Tonsillectomy    . Joint replacement    . Lumbar disc surgery    . Colonoscopy N/A 08/16/2013    Procedure: COLONOSCOPY;  Surgeon: Theda Belfast, MD;  Location: [facility] ENDOSCOPY;  Service: Endoscopy;  Laterality: N/A;   Family History  Problem Relation Age of Onset  . Heart attack Father   . Asthma Father   . Heart disease Paternal Uncle   . Rectal cancer Paternal Aunt   . Other Mother     mva   History  Substance Use Topics  . Smoking status: Former Smoker -- 0.12 packs/day for 12 years    Types: Cigarettes    Quit date: 07/05/1982  . Smokeless tobacco: Never Used     Comment: smoked for 1.5 yrs about 2 cigs a day ,only if stressed  . Alcohol Use: No   OB History   Grav Para Term Preterm Abortions TAB SAB Ect Mult Living                 Review of Systems  Constitutional: Negative for fever.  Musculoskeletal: Positive for myalgias.  Skin: Positive for wound.  Neurological: Negative for numbness.  All other systems reviewed and are negative.    Allergies  Daptomycin; Morphine and  related; Cephalexin; Celecoxib; Codeine; Fluoxetine hcl; Latex; Ofloxacin; Penicillins; Rofecoxib; and Sulfonamide derivatives  Home Medications   Current Outpatient Rx  Name  Route  Sig  Dispense  Refill  . albuterol (PROVENTIL HFA;VENTOLIN HFA) 108 (90 BASE) MCG/ACT inhaler   Inhalation   Inhale 2 puffs into the lungs every 6 (six) hours as needed for wheezing or shortness of breath.          . allopurinol (ZYLOPRIM) 100 MG tablet   Oral   Take 100 mg by mouth daily.          [name] amiodarone (PACERONE) 200 MG tablet   Oral   Take 200 mg by mouth daily.         [name] amLODipine (NORVASC) 10 MG tablet   Oral   Take 1 tablet (10 mg total) by mouth daily.   30 tablet   1   . atorvastatin (LIPITOR) 20 MG tablet   Oral   Take 20 mg by mouth at bedtime.         . enoxaparin (LOVENOX) 120 MG/0.8ML injection   Subcutaneous   Inject 0.8 mLs (120 mg total) into the skin daily. Stop once INR is 2   5 Syringe   0   . ferrous sulfate 325 (65 FE) MG tablet   Oral   Take 325 mg by mouth 3 (three) times daily with meals.         . furosemide (LASIX) 40 MG tablet   Oral   Take 1 tablet (40 mg total) by mouth daily.   30 tablet      . hydrALAZINE (APRESOLINE) 50 MG tablet   Oral   Take 50 mg by mouth 3 (three) times daily.         . insulin aspart (NOVOLOG) 100 UNIT/ML injection   Subcutaneous   Inject 0-20 Units into the skin 3 (three) times daily before meals. Per sliding scale         . insulin glargine (LANTUS) 100 UNIT/ML injection   Subcutaneous   Inject 20 Units into the skin 2 (two) times daily.         [name] levofloxacin (LEVAQUIN) 250 MG tablet   Oral   Take 250 mg by mouth daily.         . metoprolol tartrate (LOPRESSOR) 25 MG tablet   Oral   Take 1 tablet (25 mg total) by mouth 2 (two) times daily.   60 tablet  0   . ondansetron (ZOFRAN) 4 MG tablet   Oral   Take 1 tablet (4 mg total) by mouth every 6 (six) hours as needed for nausea.   20  tablet   0   . oxyCODONE-acetaminophen (PERCOCET/ROXICET) 5-325 MG per tablet   Oral   Take 1 tablet by mouth every 8 (eight) hours as needed.   10 tablet   0   . pantoprazole (PROTONIX) 40 MG tablet   Oral   Take 40 mg by mouth daily.         [name] warfarin (COUMADIN) 10 MG tablet   Oral   Take 10 mg by mouth daily.         [name] zolpidem (AMBIEN) 5 MG tablet   Oral   Take 5 mg by mouth at bedtime as needed for sleep.           BP 166/150  Pulse 82  Temp(Src) 98.2 F (36.8 C) (Oral)  Resp 28  SpO2 98% Physical Exam  Nursing note and vitals reviewed. Constitutional: She is oriented to person, place, and time.  Patient is a morbidly obese, laying in bed, appears to be very uncomfortable  HENT:  Head: Normocephalic and atraumatic.  Mouth/Throat: Oropharynx is clear and moist.  Eyes: Conjunctivae are normal.  Neck: Neck supple.  Cardiovascular: Normal rate and regular rhythm.   Pulmonary/Chest:  Tachypnea, distant breath sounds but difficult to assess due to large body habitus.    Musculoskeletal: She exhibits tenderness (Patient has a moderate size ecchymosis and tense skin to her right calf overlying the gastrocnemius muscle, with moderate tenderness to palpation.).  Difficult to assess pedal pulse due to body habitus  Neurological: She is alert and oriented to person, place, and time.  Skin:  Patient has multiple bruises throughout body in various stage.  Psychiatric: She has a normal mood and affect.    ED Course  Procedures (including critical care time)  12:07 AM Pt with an apparent hematoma to R calf that is getting progressive in size and increased tenderness. Currently on coumadin.   No fever.  Doubt necrotizing fasciitis.   Care discussed with attending.  Will check INR.  Pain medication given.  WIll obtain tibia/fibula CT on R leg to assess hematoma.    12:54 AM Pt has normal WBC.  Her hemoglobin is low at 7.2, slightly lower than her baseline. Type and  screen order, hemoccult ordered.  Her INR 4.53, supratherapeutic.  Currently awaits CT result.  Pt likely need admission for pain control and management of her INR, as well as preventing compartment syndrome.  My attending will continue with care.    1:07 AM Pt need admission for medical management of her coumadin, will get surgery involved once CT scan result.    1:31 AM I have consullted [facility], Dr. Onalee Hua who agrees to see and admit pt once CT resulted.    Labs Review Labs Reviewed  GLUCOSE, CAPILLARY - Abnormal; Notable for the following:    Glucose-Capillary 109 (*)    All other components within normal limits  CBC WITH DIFFERENTIAL - Abnormal; Notable for the following:    RBC 3.01 (*)    Hemoglobin 7.2 (*)    HCT 23.5 (*)    MCH 23.9 (*)    RDW 21.6 (*)    Neutrophils Relative % 84 (*)    Lymphocytes Relative 9 (*)    Neutro Abs 8.8 (*)    All other components within normal limits  PROTIME-INR - Abnormal; Notable for the following:    Prothrombin Time 41.2 (*)    INR 4.53 (*)    All other components within normal limits  POCT I-STAT, CHEM 8 - Abnormal; Notable for the following:    Sodium 146 (*)    Chloride 113 (*)    BUN 34 (*)    Creatinine, Ser 2.30 (*)    Glucose, Bld 115 (*)    Calcium, Ion 1.11 (*)    Hemoglobin 7.8 (*)    HCT 23.0 (*)    All other components within normal limits  OCCULT BLOOD, POC DEVICE  TYPE AND SCREEN   Imaging Review No results found.  MDM   1. Supratherapeutic INR   2. Hematoma of leg, right, initial encounter   3. Anemia    BP 166/150  Pulse 63  Temp(Src) 98.2 F (36.8 C) (Oral)  Resp 28  SpO2 100%     Fayrene Helper, PA-C 09/09/13 0059  Fayrene Helper, PA-C 09/09/13 1610

## 2013-09-09 NOTE — Progress Notes (Signed)
Pt had large amount of bright red clotted blood on bed, floor and right leg. VSS, pt alert and responds appropriately to questions. Dr. Izola Price informed, pt currently receiving second unit of plasma. Will continue to monitor. Verbon Giangregorio A

## 2013-09-09 NOTE — Anesthesia Preprocedure Evaluation (Addendum)
Anesthesia Evaluation  Patient identified by MRN, date of birth, ID band Patient awake    Reviewed: Allergy & Precautions, H&P , NPO status , Patient's Chart, lab work & pertinent test results  Airway Mallampati: II TM Distance: >3 FB Neck ROM: Full    Dental no notable dental hx.    Pulmonary shortness of breath, asthma , sleep apnea , pneumonia -, former smoker,   08-03-13 CXR: vascular congestion, cardiomegaly. breath sounds clear to auscultation  Pulmonary exam normal       Cardiovascular hypertension, Pt. on home beta blockers and Pt. on medications + Peripheral Vascular Disease and +CHF + dysrhythmias Atrial Fibrillation Rhythm:Regular Rate:Normal  ECG reviewed.  ECHO 02-11-13: EF 40-45%. Grade 2 diastolic dysfunction  .Spect 01-25-12: EF 60%. No ischemia.   Neuro/Psych PSYCHIATRIC DISORDERS Anxiety Depression CVA    GI/Hepatic Neg liver ROS, GERD-  Medicated,  Endo/Other  diabetes, Type 1, Insulin DependentMorbid obesity  Renal/GU Renal Insufficiency and CRFRenal diseaseCKD stage 4. Cr 2.13. K 5.0   negative genitourinary   Musculoskeletal negative musculoskeletal ROS (+)   Abdominal (+) + obese,   Peds negative pediatric ROS (+)  Hematology negative hematology ROS (+)   Anesthesia Other Findings   Reproductive/Obstetrics negative OB ROS                       Anesthesia Physical Anesthesia Plan  ASA: IV  Anesthesia Plan: General   Post-op Pain Management:    Induction: Intravenous  Airway Management Planned: LMA  Additional Equipment: CVP  Intra-op Plan:   Post-operative Plan: Extubation in OR  Informed Consent: I have reviewed the patients History and Physical, chart, labs and discussed the procedure including the risks, benefits and alternatives for the proposed anesthesia with the patient or authorized representative who has indicated his/her understanding and acceptance.    Dental advisory given  Plan Discussed with: CRNA  Anesthesia Plan Comments: (At increased risk due to co-morbidities, BMI, and h/o CHF.  Pt consented verbally for need for central line d/t poor access and morbid obesity. Will do after induction)    Anesthesia Quick Evaluation

## 2013-09-09 NOTE — Progress Notes (Signed)
CRITICAL VALUE ALERT  Critical value received:  Hg 5.7  Date of notification:  09/09/2013  Time of notification:  1725  Critical value read back:yes  Nurse who received alert:  Hartley Urton  MD notified (1st page):  I myers  Time of first page:  1725  MD notified (2nd page):  Time of second page:  Responding MD:  Dr Izola Price  Time MD responded:  [phone]

## 2013-09-09 NOTE — Progress Notes (Signed)
I was made aware of Hg drop. Plan on transfusing 2 units PRBC. Per ortho, plan for OR tonight. Repeat H/H post transfusion.   Debbora Presto, MD  [facility] Pager [phone]  If 7PM-7AM, please contact night-coverage [email or web address] Password [email or web address]

## 2013-09-09 NOTE — Progress Notes (Signed)
Pt right leg appears to be more swollen with the blisters enlarging. Pt has constant amount of bloody drainage coming out of open sore on leg.  MD made aware. Geralda Baumgardner A

## 2013-09-09 NOTE — Progress Notes (Addendum)
Pt seen and examined at bedside, hemodynamically stable, awaiting for ortho recommendations. INR still elevated at 2.5 but trending down, has received VitK. CBC notable for Hg drop since admission, pt receiving one unit of blood and will repeat H/H post transfusion, continue to transfuse as indicated. Repeat BMP to follow up on electrolytes and BUN/Cr.  Debbora Presto, MD  [facility] Pager [phone] Cell [phone]  If 7PM-7AM, please contact night-coverage [email or web address] Password [email or web address]

## 2013-09-09 NOTE — H&P (Signed)
PCP:   [name] [name], MD   Chief Complaint:  Leg pain  HPI: 62 yo female on coumadin for c afib comes in with right calf pain, she has a hematoma that has progressively enlarged while in the ED.  inr is over 4.  No trauma to leg.  No n/v/d.  No cp or abd pain.  No fevers.  hgb is at her baseline of around 8.  Her right calf is very pain and has a large hematoma developing.  Has received vit k already.    Review of Systems:  Positive and negative as per HPI otherwise all other systems are negative  Past Medical History: Past Medical History  Diagnosis Date  . Asthma   . Bronchitis   . Allergic rhinitis   . HTN (hypertension)   . Hyperlipidemia   . Obesity   . OSA (obstructive sleep apnea)     poor compliance with cpap  . CHF (congestive heart failure)   . Biventricular failure     compensated  . Psoriasis   . Stasis edema     of lower extremities  . Depression   . Situational stress   . Kidney stones   . Kidney disease, chronic, stage III (GFR 30-59 ml/min)   . Anemia   . Gout   . Chronic anticoagulation   . Yeast infection involving the vagina and surrounding area   . Atrial fibrillation with RVR   . GERD (gastroesophageal reflux disease)   . Chronic bronchitis     "get it q yr" (08/10/2013)  . Pneumonia     "said I did on 08/03/2013 then they ruled it out" (08/10/2013)  . Shortness of breath     "all the time" (08/10/2013)  . On home oxygen therapy     "2L during the night and prn during the day" (08/10/2013)  . Type II diabetes mellitus   . History of blood transfusion     "few during my lifetime; last time was 4 days ago when I got 2 units" (08/10/2013)  . Stroke ?2007    denies residuals on 08/10/2013  . DJD (degenerative joint disease)   . Bleeding     "from my skin folds; just won't stop" (08/10/2013)  . Complication of anesthesia     "they gave me too much; I stayed out of it for awhile" (08/10/2013)   Past Surgical History  Procedure Laterality Date  . Appendectomy     . Cholecystectomy    . Total knee arthroplasty Left ~ 2006  . Total abdominal hysterectomy    . Back surgery    . Cystectomy Left     hand  . Tonsillectomy    . Joint replacement    . Lumbar disc surgery    . Colonoscopy N/A 08/16/2013    Procedure: COLONOSCOPY;  Surgeon: Theda Belfast, MD;  Location: [facility] ENDOSCOPY;  Service: Endoscopy;  Laterality: N/A;    Medications: Prior to Admission medications   Medication Sig Start Date End Date Taking? Authorizing Provider  albuterol (PROVENTIL HFA;VENTOLIN HFA) 108 (90 BASE) MCG/ACT inhaler Inhale 2 puffs into the lungs every 6 (six) hours as needed for wheezing or shortness of breath.    Yes Historical Provider, MD  allopurinol (ZYLOPRIM) 100 MG tablet Take 100 mg by mouth daily.  09/16/11  Yes Historical Provider, MD  amiodarone (PACERONE) 200 MG tablet Take 200 mg by mouth daily.   Yes Historical Provider, MD  amLODipine (NORVASC) 10 MG tablet Take 1 tablet (  10 mg total) by mouth daily. 08/19/13  Yes Leroy Sea, MD  atorvastatin (LIPITOR) 20 MG tablet Take 20 mg by mouth at bedtime.   Yes Historical Provider, MD  ferrous sulfate 325 (65 FE) MG tablet Take 325 mg by mouth 3 (three) times daily with meals. 08/29/12  Yes Gwenyth Bender, MD  furosemide (LASIX) 40 MG tablet Take 1 tablet (40 mg total) by mouth daily. 08/19/13  Yes Leroy Sea, MD  hydrALAZINE (APRESOLINE) 50 MG tablet Take 50 mg by mouth 3 (three) times daily.   Yes Historical Provider, MD  insulin aspart (NOVOLOG) 100 UNIT/ML injection Inject 0-20 Units into the skin 3 (three) times daily before meals. Per sliding scale   Yes Historical Provider, MD  insulin glargine (LANTUS) 100 UNIT/ML injection Inject 20 Units into the skin 2 (two) times daily.   Yes Historical Provider, MD  levofloxacin (LEVAQUIN) 250 MG tablet Take 250 mg by mouth daily.   Yes Historical Provider, MD  metoprolol tartrate (LOPRESSOR) 25 MG tablet Take 1 tablet (25 mg total) by mouth 2 (two) times daily.  08/08/13  Yes Jeralyn Bennett, MD  ondansetron (ZOFRAN) 4 MG tablet Take 1 tablet (4 mg total) by mouth every 6 (six) hours as needed for nausea. 08/08/13  Yes Jeralyn Bennett, MD  pantoprazole (PROTONIX) 40 MG tablet Take 40 mg by mouth daily.   Yes Historical Provider, MD  warfarin (COUMADIN) 10 MG tablet Take 10 mg by mouth daily.   Yes Historical Provider, MD  zolpidem (AMBIEN) 5 MG tablet Take 5 mg by mouth at bedtime as needed for sleep.    Yes Historical Provider, MD    Allergies:   Allergies  Allergen Reactions  . Daptomycin Other (See Comments)    Elevated CPK  . Morphine And Related Nausea And Vomiting  . Cephalexin Rash    unknown  . Celecoxib     unknown  . Codeine     unknown  . Fluoxetine Hcl     unknown  . Latex     REACTION: undefined  . Ofloxacin     unknown  . Penicillins     unknown  . Rofecoxib     unknown  . Sulfonamide Derivatives     unknown  . Tramadol Nausea And Vomiting    Social History:  reports that she quit smoking about 31 years ago. Her smoking use included Cigarettes. She has a 1.44 pack-year smoking history. She has never used smokeless tobacco. She reports that she does not drink alcohol or use illicit drugs.  Family History: Family History  Problem Relation Age of Onset  . Heart attack Father   . Asthma Father   . Heart disease Paternal Uncle   . Rectal cancer Paternal Aunt   . Other Mother     mva    Physical Exam: Filed Vitals:   09/08/13 2253 09/08/13 2255 09/09/13 0030 09/09/13 0058  BP: 166/150 166/150  169/64  Pulse: 82 82 63 61  Temp:  98.2 F (36.8 C)  97.7 F (36.5 C)  TempSrc: Oral Oral  Oral  Resp:  28  20  SpO2: 98% 98% 100% 100%   General appearance: alert, cooperative and no distress Head: Normocephalic, without obvious abnormality, atraumatic Eyes: negative Nose: Nares normal. Septum midline. Mucosa normal. No drainage or sinus tenderness. Neck: no JVD and supple, symmetrical, trachea midline Lungs:  clear to auscultation bilaterally Heart: regular rate and rhythm, S1, S2 normal, no murmur, click, rub or gallop  Abdomen: soft, non-tender; bowel sounds normal; no masses,  no organomegaly Extremities: large hematoma to right calf, pulses intact Pulses: 2+ and symmetric Skin: Skin color, texture, turgor normal. No rashes or lesions Neurologic: Grossly normal    Labs on Admission:   Recent Labs  09/09/13 0015  NA 146*  K 4.1  CL 113*  GLUCOSE 115*  BUN 34*  CREATININE 2.30*    Recent Labs  09/08/13 2355 09/09/13 0015  WBC 10.4  --   NEUTROABS 8.8*  --   HGB 7.2* 7.8*  HCT 23.5* 23.0*  MCV 78.1  --   PLT 303  --     Radiological Exams on Admission:   Assessment/Plan  62 yo female with suprather inr and large developing hematoma to right calf Principal Problem:   Spontaneous hematoma of lower leg Active Problems:   HYPERTENSION   DM (diabetes mellitus)   Chronic anticoagulation   CKD (chronic kidney disease), stage IV   Anemia of chronic disease   PAF (paroxysmal atrial fibrillation)   Morbid obesity   Supratherapeutic INR  Vit K given.  Order 2 units of ffp.  Hold coumadin.  Ortho also called for consultation to eval for compartment syndrome.  Pain is severe, give dilaudid.  Monitor h/h.  Vss.  Admit to tele.  Full code.  Marquette Piontek A 09/09/2013, 1:39 AM

## 2013-09-09 NOTE — ED Provider Notes (Signed)
Medical screening examination/treatment/procedure(s) were conducted as a shared visit with non-physician practitioner(s) and myself.  I personally evaluated the patient during the encounter  R calf pain and ecchymosis on coumadin, onset tonight, increasing size of hematoma since onset. On exam has chronic changes LEs with tenderness to medial R calf and large area of ecchymosis.   INR elevated. FFP and Vit K given.   Dr Onalee Hua MED admit  3:28 AM d/w Ortho DR Charlann Boxer, reviewed Ct results and recs since subcutaneous and not in the muscle OK for ice and compression, is not a concern for compartment syndrome, Ortho will follow.    Results for orders placed during the hospital encounter of 09/08/13  GLUCOSE, CAPILLARY      Result Value Range   Glucose-Capillary 109 (*) 70 - 99 mg/dL   Comment 1 Documented in Chart     Comment 2 Notify RN    CBC WITH DIFFERENTIAL      Result Value Range   WBC 10.4  4.0 - 10.5 K/uL   RBC 3.01 (*) 3.87 - 5.11 MIL/uL   Hemoglobin 7.2 (*) 12.0 - 15.0 g/dL   HCT 69.6 (*) 29.5 - 28.4 %   MCV 78.1  78.0 - 100.0 fL   MCH 23.9 (*) 26.0 - 34.0 pg   MCHC 30.6  30.0 - 36.0 g/dL   RDW 13.2 (*) 44.0 - 10.2 %   Platelets 303  150 - 400 K/uL   Neutrophils Relative % 84 (*) 43 - 77 %   Lymphocytes Relative 9 (*) 12 - 46 %   Monocytes Relative 5  3 - 12 %   Eosinophils Relative 2  0 - 5 %   Basophils Relative 0  0 - 1 %   Neutro Abs 8.8 (*) 1.7 - 7.7 K/uL   Lymphs Abs 0.9  0.7 - 4.0 K/uL   Monocytes Absolute 0.5  0.1 - 1.0 K/uL   Eosinophils Absolute 0.2  0.0 - 0.7 K/uL   Basophils Absolute 0.0  0.0 - 0.1 K/uL   RBC Morphology POLYCHROMASIA PRESENT    PROTIME-INR      Result Value Range   Prothrombin Time 41.2 (*) 11.6 - 15.2 seconds   INR 4.53 (*) 0.00 - 1.49  POCT I-STAT, CHEM 8      Result Value Range   Sodium 146 (*) 135 - 145 mEq/L   Potassium 4.1  3.5 - 5.1 mEq/L   Chloride 113 (*) 96 - 112 mEq/L   BUN 34 (*) 6 - 23 mg/dL   Creatinine, Ser 7.25 (*) 0.50 -  1.10 mg/dL   Glucose, Bld 366 (*) 70 - 99 mg/dL   Calcium, Ion 4.40 (*) 1.13 - 1.30 mmol/L   TCO2 21  0 - 100 mmol/L   Hemoglobin 7.8 (*) 12.0 - 15.0 g/dL   HCT 34.7 (*) 42.5 - 95.6 %  OCCULT BLOOD, POC DEVICE      Result Value Range   Fecal Occult Bld NEGATIVE  NEGATIVE  TYPE AND SCREEN      Result Value Range   ABO/RH(D) A POS     Antibody Screen PENDING     Sample Expiration 09/12/2013    ABO/RH      Result Value Range   ABO/RH(D) A POS     US Renal  08/12/2013   *RADIOLOGY REPORT*  Clinical Data: Acute kidney injury.  RENAL/URINARY TRACT ULTRASOUND COMPLETE  Comparison:  01/23/2012.  Findings:  Right Kidney:  9 cm in length.  No  hydronephrosis.  Diffuse increased renal echogenicity.  There are two dominant cysts, also seen previously, located in the interpolar and lower pole regions. The largest is in the lower pole, 1.7 cm in diameter.  Left Kidney:  11 cm in length.  No hydronephrosis.  Renal echogenicity more normal in appearance.  No focal abnormality.  Bladder:  98 ml pre void.  No focal wall thickening or filling defect.  IMPRESSION:  1.  No hydronephrosis. 2.  Medical renal disease on the right, with atrophy.   Original Report Authenticated By: Tiburcio Pea   Ct Tibia Fibula Right Wo Contrast  09/09/2013   CLINICAL DATA:  Evaluate calf hematoma.  EXAM: CT OF THE RIGHT TIBIA FIBULA WITHOUT CONTRAST  TECHNIQUE: Imaging through the right calf without intravenous contrast. Multiplanar reconstructed images were performed and reviewed.  COMPARISON:  Plain films 03/10/2012  FINDINGS: There is a large complex fluid collection within the subcutaneous soft tissues of the right calf posterior medially. This measures 20 x 15 x 8 cm. Areas of internal layering high density. Findings most likely reflective of large partially liquified hematoma. This remains in the subcutaneous soft tissues. No hematoma within the deep tissues. No intramuscular abnormality. No acute bony abnormality.  IMPRESSION:  Large subcutaneous posterior medial calf hematoma.   Electronically Signed   By: Charlett Nose M.D.   On: 09/09/2013 02:38       Sunnie Nielsen, MD 09/09/13 [phone]

## 2013-09-09 NOTE — Care Management Note (Addendum)
    Page 1 of 2   09/21/2013     3:50:01 PM   CARE MANAGEMENT NOTE 09/21/2013  Patient:  [name], [name]   Account Number:  [id number]  Date Initiated:  09/09/2013  Documentation initiated by:  [facility]  Subjective/Objective Assessment:   62 Y/O F ADMITTED W/R LEGE HEMATOMA.ZO:[id number].READMIT 9/2-9/11-GIB.     Action/Plan:   FROM HOME.HAS PCP,PHARMACY.   Anticipated DC Date:  09/22/2013   Anticipated DC Plan:  SKILLED NURSING FACILITY      DC Planning Services  CM consult      Choice offered to / List presented to:             Status of service:  In process, will continue to follow Medicare Important Message given?   (If response is "NO", the following Medicare IM given date fields will be blank) Date Medicare IM given:   Date Additional Medicare IM given:    Discharge Disposition:    Per UR Regulation:  Reviewed for med. necessity/level of care/duration of stay  If discussed at Long Length of Stay Meetings, dates discussed:   09/16/2013  09/21/2013    Comments:  09/21/13 Lawrencia Mauney RN,BSN NCM [phone] FOR OR IN AM-WOUND EVAC & DEBRIDEMENT.D/C PLAN SNF W/WOUND VAC.  09/20/13 Zeidy Tayag RN,BSN NCM [phone] PT-SNF.HAS WOUND VAC.IF MED STABLE FOR D/C IN AM SNF.  09/16/13 Demya Scruggs RN,BSN NCM [phone] PLASTICS FOLLOWING-WOUND VAC IN PLACE.FLUID OVERLOAD-IV LASIX.PT-SNF  09/15/13 Promyse Ardito RN,BSN NCM [phone] PT-SNF.FOR WOUND VAC.AWAIT RECOMMENDATIONS ON WOUND VAC.HAVE HOME WOUND VAC FORM ON CHART JUST IN CASE D/C PLAN IS FOR HOME.[facility] FOLLOWING.  09/13/13 Hall Birchard RN,BSN NCM [phone] MAY NEED WOUND VAC,WILL PUT [facility] WOUND VAC FORMS ON CHART FOR MD SIGNATURE IF HOME WOUND VAC NEEDED.WILL NEED TIME TO SET UP.[facility] HHRN ALREADY FOLLOWING.WILL NEED HHRN ORDERS.  09/09/13 Hershall Benkert RN,BSN NCM [phone] [facility] REP-[phone] CONFIRMED ACTIVE W/HHRN.WILL AWAIT FINAL HH ORDERS.

## 2013-09-09 NOTE — Consult Note (Signed)
  Patient seen and examined, full note to follow  Right posterior leg swelling Tender to touch Skin blistering with some weeping  Very large legs, with chronic changes appreciated  INR down to 2.5  She maybe would benefit for an incisional evacuation of hematoma that should be done in the OR, but would need INR at least less than 2  Will follow labs and clinical course for any changes

## 2013-09-09 NOTE — Progress Notes (Signed)
Pt low Hg reported to Dr Izola Price.  Pt aaox3.  Pt assympotomatic of low hg.  anesthesiology at bedside.  Dr Charlann Boxer on phone with family.  Pt surgery to be help until tomorrow morning per dr Charlann Boxer.

## 2013-09-09 NOTE — Progress Notes (Signed)
Patient refused to have her legs elevated on pillows, refused application of ice pack and ace wrap. She is complaining of severe pain at the moment, very tender to touch. Pain medication given. Will continue to monitor patient.

## 2013-09-09 NOTE — ED Notes (Signed)
Pt at CT

## 2013-09-10 ENCOUNTER — Inpatient Hospital Stay (HOSPITAL_COMMUNITY): Payer: Medicare HMO

## 2013-09-10 ENCOUNTER — Inpatient Hospital Stay (HOSPITAL_COMMUNITY): Payer: Medicare HMO | Admitting: Anesthesiology

## 2013-09-10 ENCOUNTER — Encounter (HOSPITAL_COMMUNITY): Payer: Self-pay | Admitting: Certified Registered Nurse Anesthetist

## 2013-09-10 ENCOUNTER — Encounter (HOSPITAL_COMMUNITY)
Admission: EM | Disposition: A | Discharge status: Skilled Nursing Facility | Payer: Medicare HMO | Source: Home / Self Care | Attending: Internal Medicine

## 2013-09-10 ENCOUNTER — Encounter (HOSPITAL_COMMUNITY): Payer: Self-pay | Admitting: Anesthesiology

## 2013-09-10 DIAGNOSIS — J45901 Unspecified asthma with (acute) exacerbation: Secondary | ICD-10-CM

## 2013-09-10 DIAGNOSIS — M7981 Nontraumatic hematoma of soft tissue: Secondary | ICD-10-CM

## 2013-09-10 DIAGNOSIS — I5041 Acute combined systolic (congestive) and diastolic (congestive) heart failure: Secondary | ICD-10-CM

## 2013-09-10 HISTORY — PX: HEMATOMA EVACUATION: SHX5118

## 2013-09-10 LAB — HEMOGLOBIN AND HEMATOCRIT, BLOOD
HCT: 20.5 % — ABNORMAL LOW (ref 36.0–46.0)
HCT: 26.4 % — ABNORMAL LOW (ref 36.0–46.0)
HCT: 20.7 % — ABNORMAL LOW (ref 36.0–46.0)
Hemoglobin: 6.7 g/dL — CL (ref 12.0–15.0)
Hemoglobin: 8.9 g/dL — ABNORMAL LOW (ref 12.0–15.0)

## 2013-09-10 LAB — GLUCOSE, CAPILLARY
Glucose-Capillary: 85 mg/dL (ref 70–99)
Glucose-Capillary: 120 mg/dL — ABNORMAL HIGH (ref 70–99)
Glucose-Capillary: 161 mg/dL — ABNORMAL HIGH (ref 70–99)

## 2013-09-10 LAB — PREPARE FRESH FROZEN PLASMA

## 2013-09-10 LAB — PREPARE RBC (CROSSMATCH)

## 2013-09-10 SURGERY — EVACUATION HEMATOMA
Anesthesia: General | Site: Leg Lower | Laterality: Right | Wound class: Contaminated

## 2013-09-10 MED ORDER — MIDAZOLAM HCL 5 MG/5ML IJ SOLN
INTRAMUSCULAR | Status: DC | PRN
  Administered 2013-09-10: 2 mg via INTRAVENOUS

## 2013-09-10 MED ORDER — FENTANYL CITRATE 0.05 MG/ML IJ SOLN
INTRAMUSCULAR | Status: AC
  Filled 2013-09-10: qty 2

## 2013-09-10 MED ORDER — VANCOMYCIN HCL 10 G IV SOLR
1500.0000 mg | INTRAVENOUS | Status: DC
  Filled 2013-09-10: qty 1500

## 2013-09-10 MED ORDER — FERROUS SULFATE 325 (65 FE) MG PO TABS
325.0000 mg | ORAL_TABLET | Freq: Three times a day (TID) | ORAL | Status: DC
  Administered 2013-09-11 – 2013-09-21 (×33): 325 mg via ORAL
  Filled 2013-09-10 (×39): qty 1

## 2013-09-10 MED ORDER — PROMETHAZINE HCL 25 MG/ML IJ SOLN
INTRAMUSCULAR | Status: AC
  Filled 2013-09-10: qty 1

## 2013-09-10 MED ORDER — HYDROMORPHONE HCL PF 1 MG/ML IJ SOLN
0.2500 mg | INTRAMUSCULAR | Status: DC | PRN
  Administered 2013-09-10: 0.5 mg via INTRAVENOUS

## 2013-09-10 MED ORDER — PROMETHAZINE HCL 25 MG/ML IJ SOLN
6.2500 mg | INTRAMUSCULAR | Status: DC | PRN
  Administered 2013-09-10: 6.25 mg via INTRAVENOUS

## 2013-09-10 MED ORDER — ALBUTEROL SULFATE HFA 108 (90 BASE) MCG/ACT IN AERS
2.0000 | INHALATION_SPRAY | Freq: Four times a day (QID) | RESPIRATORY_TRACT | Status: DC | PRN
  Filled 2013-09-10: qty 6.7

## 2013-09-10 MED ORDER — EPHEDRINE SULFATE 50 MG/ML IJ SOLN
INTRAMUSCULAR | Status: DC | PRN
  Administered 2013-09-10: 10 mg via INTRAVENOUS

## 2013-09-10 MED ORDER — METOPROLOL TARTRATE 25 MG PO TABS
25.0000 mg | ORAL_TABLET | Freq: Two times a day (BID) | ORAL | Status: DC
  Administered 2013-09-10 – 2013-09-14 (×8): 25 mg via ORAL
  Filled 2013-09-10 (×13): qty 1

## 2013-09-10 MED ORDER — INSULIN ASPART 100 UNIT/ML ~~LOC~~ SOLN
0.0000 [IU] | Freq: Three times a day (TID) | SUBCUTANEOUS | Status: DC
  Administered 2013-09-12 – 2013-09-15 (×3): 1 [IU] via SUBCUTANEOUS
  Administered 2013-09-17: 2 [IU] via SUBCUTANEOUS
  Administered 2013-09-18: 12:00:00 via SUBCUTANEOUS
  Administered 2013-09-19 (×2): 2 [IU] via SUBCUTANEOUS
  Administered 2013-09-20 – 2013-09-21 (×5): 1 [IU] via SUBCUTANEOUS

## 2013-09-10 MED ORDER — VANCOMYCIN HCL IN DEXTROSE 1-5 GM/200ML-% IV SOLN
INTRAVENOUS | Status: AC
  Filled 2013-09-10: qty 200

## 2013-09-10 MED ORDER — AMLODIPINE BESYLATE 10 MG PO TABS
10.0000 mg | ORAL_TABLET | Freq: Every day | ORAL | Status: DC
  Administered 2013-09-11 – 2013-09-22 (×12): 10 mg via ORAL
  Filled 2013-09-10 (×13): qty 1

## 2013-09-10 MED ORDER — MEPERIDINE HCL 50 MG/ML IJ SOLN: 6.2500 mg | INTRAMUSCULAR | Status: DC | PRN

## 2013-09-10 MED ORDER — FUROSEMIDE 40 MG PO TABS: 40.0000 mg | ORAL_TABLET | Freq: Every day | ORAL | Status: DC

## 2013-09-10 MED ORDER — HYDROMORPHONE HCL PF 1 MG/ML IJ SOLN
INTRAMUSCULAR | Status: AC
  Filled 2013-09-10: qty 1

## 2013-09-10 MED ORDER — PROPOFOL 10 MG/ML IV BOLUS
INTRAVENOUS | Status: DC | PRN
  Administered 2013-09-10: 200 mg via INTRAVENOUS

## 2013-09-10 MED ORDER — LACTATED RINGERS IV SOLN: INTRAVENOUS | Status: DC

## 2013-09-10 MED ORDER — FUROSEMIDE 40 MG PO TABS
40.0000 mg | ORAL_TABLET | Freq: Every day | ORAL | Status: DC
  Administered 2013-09-11: 40 mg via ORAL
  Filled 2013-09-10 (×2): qty 1

## 2013-09-10 MED ORDER — ONDANSETRON HCL 4 MG/2ML IJ SOLN
INTRAMUSCULAR | Status: DC | PRN
  Administered 2013-09-10: 4 mg via INTRAVENOUS

## 2013-09-10 MED ORDER — SODIUM CHLORIDE 0.9 % IV SOLN
INTRAVENOUS | Status: DC | PRN
  Administered 2013-09-10: 13:00:00 via INTRAVENOUS

## 2013-09-10 MED ORDER — ALLOPURINOL 100 MG PO TABS
100.0000 mg | ORAL_TABLET | Freq: Every day | ORAL | Status: DC
  Administered 2013-09-11 – 2013-09-22 (×12): 100 mg via ORAL
  Filled 2013-09-10 (×13): qty 1

## 2013-09-10 MED ORDER — INSULIN GLARGINE 100 UNIT/ML ~~LOC~~ SOLN
20.0000 [IU] | Freq: Two times a day (BID) | SUBCUTANEOUS | Status: DC
  Administered 2013-09-10 – 2013-09-12 (×5): 20 [IU] via SUBCUTANEOUS
  Filled 2013-09-10 (×10): qty 0.2

## 2013-09-10 MED ORDER — FENTANYL CITRATE 0.05 MG/ML IJ SOLN
INTRAMUSCULAR | Status: DC | PRN
  Administered 2013-09-10 (×2): 50 ug via INTRAVENOUS
  Administered 2013-09-10: 100 ug via INTRAVENOUS
  Administered 2013-09-10: 50 ug via INTRAVENOUS

## 2013-09-10 MED ORDER — FUROSEMIDE 10 MG/ML IJ SOLN
20.0000 mg | Freq: Once | INTRAMUSCULAR | Status: AC
  Administered 2013-09-10: 12:00:00 20 mg via INTRAVENOUS
  Filled 2013-09-10: qty 2

## 2013-09-10 MED ORDER — FENTANYL CITRATE 0.05 MG/ML IJ SOLN
25.0000 ug | INTRAMUSCULAR | Status: DC | PRN
  Administered 2013-09-10 (×2): 50 ug via INTRAVENOUS

## 2013-09-10 MED ORDER — LIDOCAINE HCL (CARDIAC) 20 MG/ML IV SOLN
INTRAVENOUS | Status: DC | PRN
  Administered 2013-09-10: 80 mg via INTRAVENOUS

## 2013-09-10 MED ORDER — SODIUM CHLORIDE 0.9 % IR SOLN
Status: DC | PRN
  Administered 2013-09-10: 3000 mL

## 2013-09-10 MED ORDER — LACTATED RINGERS IV SOLN
INTRAVENOUS | Status: DC | PRN
  Administered 2013-09-10: 14:00:00 via INTRAVENOUS

## 2013-09-10 MED ORDER — HYDROXYZINE HCL 25 MG PO TABS
25.0000 mg | ORAL_TABLET | Freq: Four times a day (QID) | ORAL | Status: DC | PRN
  Filled 2013-09-10 (×3): qty 1

## 2013-09-10 MED ORDER — VANCOMYCIN HCL IN DEXTROSE 1-5 GM/200ML-% IV SOLN
1000.0000 mg | Freq: Once | INTRAVENOUS | Status: DC
  Administered 2013-09-10: 1000 mg via INTRAVENOUS

## 2013-09-10 SURGICAL SUPPLY — 40 items
BAG SPEC THK2 15X12 ZIP CLS (MISCELLANEOUS) ×1
BAG ZIPLOCK 12X15 (MISCELLANEOUS) ×2 IMPLANT
BANDAGE ELASTIC 6 VELCRO ST LF (GAUZE/BANDAGES/DRESSINGS) ×1 IMPLANT
BANDAGE ESMARK 6X9 LF (GAUZE/BANDAGES/DRESSINGS) ×1 IMPLANT
BANDAGE GAUZE ELAST BULKY 4 IN (GAUZE/BANDAGES/DRESSINGS) ×3 IMPLANT
BNDG CMPR 9X6 STRL LF SNTH (GAUZE/BANDAGES/DRESSINGS) ×1
BNDG ESMARK 6X9 LF (GAUZE/BANDAGES/DRESSINGS) ×2
CLOTH BEACON ORANGE TIMEOUT ST (SAFETY) ×2 IMPLANT
CUFF TOURN SGL QUICK 18 (TOURNIQUET CUFF) ×1 IMPLANT
CUFF TOURN SGL QUICK 24 (TOURNIQUET CUFF)
CUFF TOURN SGL QUICK 34 (TOURNIQUET CUFF)
CUFF TRNQT CYL 24X4X40X1 (TOURNIQUET CUFF) ×1 IMPLANT
CUFF TRNQT CYL 34X4X40X1 (TOURNIQUET CUFF) ×1 IMPLANT
DRAIN PENROSE 18X1/2 LTX STRL (DRAIN) ×1 IMPLANT
DRSG PAD ABDOMINAL 8X10 ST (GAUZE/BANDAGES/DRESSINGS) ×2 IMPLANT
DURAPREP 26ML APPLICATOR (WOUND CARE) ×1 IMPLANT
ELECT REM PT RETURN 9FT ADLT (ELECTROSURGICAL) ×2
ELECTRODE REM PT RTRN 9FT ADLT (ELECTROSURGICAL) ×1 IMPLANT
GLOVE BIOGEL PI IND STRL 7.5 (GLOVE) ×1 IMPLANT
GLOVE BIOGEL PI IND STRL 8 (GLOVE) ×1 IMPLANT
GLOVE BIOGEL PI INDICATOR 7.5 (GLOVE) ×1
GLOVE BIOGEL PI INDICATOR 8 (GLOVE) ×1
GLOVE ECLIPSE 8.0 STRL XLNG CF (GLOVE) IMPLANT
GLOVE ORTHO TXT STRL SZ7.5 (GLOVE) ×4 IMPLANT
GLOVE SURG ORTHO 8.0 STRL STRW (GLOVE) ×2 IMPLANT
GOWN BRE IMP PREV XXLGXLNG (GOWN DISPOSABLE) ×4 IMPLANT
GOWN PREVENTION PLUS LG XLONG (DISPOSABLE) ×2 IMPLANT
HANDPIECE INTERPULSE COAX TIP (DISPOSABLE) ×2
KIT BASIN OR (CUSTOM PROCEDURE TRAY) ×2 IMPLANT
MANIFOLD NEPTUNE II (INSTRUMENTS) ×2 IMPLANT
PACK LOWER EXTREMITY WL (CUSTOM PROCEDURE TRAY) ×2 IMPLANT
PAD ABD 7.5X8 STRL (GAUZE/BANDAGES/DRESSINGS) ×1 IMPLANT
PAD CAST 4YDX4 CTTN HI CHSV (CAST SUPPLIES) ×1 IMPLANT
PADDING CAST COTTON 4X4 STRL (CAST SUPPLIES)
POSITIONER SURGICAL ARM (MISCELLANEOUS) ×2 IMPLANT
SET HNDPC FAN SPRY TIP SCT (DISPOSABLE) ×1 IMPLANT
SOL PREP PROV IODINE SCRUB 4OZ (MISCELLANEOUS) ×2 IMPLANT
SPONGE GAUZE 4X4 12PLY (GAUZE/BANDAGES/DRESSINGS) ×2 IMPLANT
SYR CONTROL 10ML LL (SYRINGE) ×2 IMPLANT
TOWEL OR 17X26 10 PK STRL BLUE (TOWEL DISPOSABLE) ×4 IMPLANT

## 2013-09-10 NOTE — Progress Notes (Signed)
Asked patient if family was aware of patient having surgery and if she wanted me to call family, she stated "they know that I'm having surgery but not time, but don't call."

## 2013-09-10 NOTE — Progress Notes (Signed)
NOS  Called by nursing at about 23:00 concerned about bleeding from right posterior leg wound.  Dr. Carolynne Edouard evacuated a right posterior calf hematoma earlier today.  Patient is alert with stable VS.  Has pain in posterior right leg, but not as bad as a couple of days ago.  He obesity/edema/venous stasis precludes palpating pedal pulses, but her foot is warm.  BP 130/54  Pulse 68  Temp(Src) 97.3 F (36.3 C) (Oral)  Resp 16  Ht 5\' 3"  (1.6 m)  Wt 280 lb 1.6 oz (127.053 kg)  BMI 49.63 kg/m2  SpO2 100%  O:  The dressing was taken down.  There is an approx 18 x 14 cm open wound with packing.  The patient has chronic venous stasis and chronic lymphedema of both lower extremities.   There is no active bleeding.  In fact the packing, though stained with blood, is damp either with saline or lymphatic fluid.  A&P:   1.  Large right posterior calf wound, with probable lymphatic drainage from chronic venous stasis/lymphedematous changes.  2.  Anemia, acute blood loss    Hemoglobin - 7.0 - not sure how much of this is rehydration and how much is from other blood loss.  Will plan to transfuse 1 units PRBC. (Spoke to [place] about order) Has AM labs ordered.  In looking at her old chart, her Hgb has ranged from 7.5 to 9 over the last year.  In 2013, she did have a Hgb > 10.  She has had 107 CBC's drawn since 2011!  3.  Coags pending.  Ovidio Kin, MD, [facility] Pager: [phone] Office phone:  [phone]

## 2013-09-10 NOTE — Consult Note (Signed)
Reason for Consult:hematoma of right leg Referring Physician: Dr. Corinne Ports is an 62 y.o. female.  HPI: The pt is a 62yo bf who was admitted yesterday with a spontaeous hematoma of right calf. She is on coumadin for afib and had inr of 4. This has been corrected but she has a lot of swelling posteriorly in right calf with tenderness. CT shows hematoma to be subcutaneous. She also has h/o chf, diabetes, and stroke. She only ambulates a little with a walker.  Past Medical History  Diagnosis Date  . Asthma   . Bronchitis   . Allergic rhinitis   . HTN (hypertension)   . Hyperlipidemia   . Obesity   . OSA (obstructive sleep apnea)     poor compliance with cpap  . CHF (congestive heart failure)   . Biventricular failure     compensated  . Psoriasis   . Stasis edema     of lower extremities  . Depression   . Situational stress   . Kidney stones   . Kidney disease, chronic, stage III (GFR 30-59 ml/min)   . Anemia   . Gout   . Chronic anticoagulation   . Yeast infection involving the vagina and surrounding area   . Atrial fibrillation with RVR   . GERD (gastroesophageal reflux disease)   . Chronic bronchitis     "get it q yr" (08/10/2013)  . Pneumonia     "said I did on 08/03/2013 then they ruled it out" (08/10/2013)  . Shortness of breath     "all the time" (08/10/2013)  . On home oxygen therapy     "2L during the night and prn during the day" (08/10/2013)  . Type II diabetes mellitus   . History of blood transfusion     "few during my lifetime; last time was 4 days ago when I got 2 units" (08/10/2013)  . Stroke ?2007    denies residuals on 08/10/2013  . DJD (degenerative joint disease)   . Bleeding     "from my skin folds; just won't stop" (08/10/2013)  . Complication of anesthesia     "they gave me too much; I stayed out of it for awhile" (08/10/2013)  . Anxiety     Past Surgical History  Procedure Laterality Date  . Appendectomy    . Cholecystectomy    . Total knee  arthroplasty Left ~ 2006  . Total abdominal hysterectomy    . Back surgery    . Cystectomy Left     hand  . Tonsillectomy    . Joint replacement    . Lumbar disc surgery    . Colonoscopy N/A 08/16/2013    Procedure: COLONOSCOPY;  Surgeon: Theda Belfast, MD;  Location: [facility] ENDOSCOPY;  Service: Endoscopy;  Laterality: N/A;    Family History  Problem Relation Age of Onset  . Heart attack Father   . Asthma Father   . Heart disease Paternal Uncle   . Rectal cancer Paternal Aunt   . Other Mother     mva    Social History:  reports that she quit smoking about 31 years ago. Her smoking use included Cigarettes. She has a 1.44 pack-year smoking history. She has never used smokeless tobacco. She reports that she does not drink alcohol or use illicit drugs.  Allergies:  Allergies  Allergen Reactions  . Daptomycin Other (See Comments)    Elevated CPK  . Morphine And Related Nausea And Vomiting  . Cephalexin Rash  unknown  . Celecoxib     unknown  . Codeine     unknown  . Fluoxetine Hcl     unknown  . Latex     REACTION: undefined  . Ofloxacin     unknown  . Penicillins     unknown  . Rofecoxib     unknown  . Sulfonamide Derivatives     unknown  . Tramadol Nausea And Vomiting    Medications: I have reviewed the patient's current medications.  Results for orders placed during the hospital encounter of 09/08/13 (from the past 48 hour(s))  GLUCOSE, CAPILLARY     Status: Abnormal   Collection Time    09/08/13 11:06 PM      Result Value Range   Glucose-Capillary 109 (*) 70 - 99 mg/dL   Comment 1 Documented in Chart     Comment 2 Notify RN    CBC WITH DIFFERENTIAL     Status: Abnormal   Collection Time    09/08/13 11:55 PM      Result Value Range   WBC 10.4  4.0 - 10.5 K/uL   RBC 3.01 (*) 3.87 - 5.11 MIL/uL   Hemoglobin 7.2 (*) 12.0 - 15.0 g/dL   HCT 40.9 (*) 81.1 - 91.4 %   MCV 78.1  78.0 - 100.0 fL   MCH 23.9 (*) 26.0 - 34.0 pg   MCHC 30.6  30.0 - 36.0 g/dL   RDW  78.2 (*) 95.6 - 15.5 %   Platelets 303  150 - 400 K/uL   Neutrophils Relative % 84 (*) 43 - 77 %   Lymphocytes Relative 9 (*) 12 - 46 %   Monocytes Relative 5  3 - 12 %   Eosinophils Relative 2  0 - 5 %   Basophils Relative 0  0 - 1 %   Neutro Abs 8.8 (*) 1.7 - 7.7 K/uL   Lymphs Abs 0.9  0.7 - 4.0 K/uL   Monocytes Absolute 0.5  0.1 - 1.0 K/uL   Eosinophils Absolute 0.2  0.0 - 0.7 K/uL   Basophils Absolute 0.0  0.0 - 0.1 K/uL   RBC Morphology POLYCHROMASIA PRESENT     Comment: STOMATOCYTES  PROTIME-INR     Status: Abnormal   Collection Time    09/08/13 11:55 PM      Result Value Range   Prothrombin Time 41.2 (*) 11.6 - 15.2 seconds   INR 4.53 (*) 0.00 - 1.49  POCT I-STAT, CHEM 8     Status: Abnormal   Collection Time    09/09/13 12:15 AM      Result Value Range   Sodium 146 (*) 135 - 145 mEq/L   Potassium 4.1  3.5 - 5.1 mEq/L   Chloride 113 (*) 96 - 112 mEq/L   BUN 34 (*) 6 - 23 mg/dL   Creatinine, Ser 2.13 (*) 0.50 - 1.10 mg/dL   Glucose, Bld 086 (*) 70 - 99 mg/dL   Calcium, Ion 5.78 (*) 1.13 - 1.30 mmol/L   TCO2 21  0 - 100 mmol/L   Hemoglobin 7.8 (*) 12.0 - 15.0 g/dL   HCT 46.9 (*) 62.9 - 52.8 %  OCCULT BLOOD, POC DEVICE     Status: None   Collection Time    09/09/13  1:16 AM      Result Value Range   Fecal Occult Bld NEGATIVE  NEGATIVE  TYPE AND SCREEN     Status: None   Collection Time    09/09/13  1:31 AM      Result Value Range   ABO/RH(D) A POS     Antibody Screen NEG     Sample Expiration 09/12/2013     Unit Number [id number]     Blood Component Type RED CELLS,LR     Unit division 00     Status of Unit ISSUED,FINAL     Transfusion Status OK TO TRANSFUSE     Crossmatch Result Compatible     Unit Number [id number]     Blood Component Type RED CELLS,LR     Unit division 00     Status of Unit ISSUED,FINAL     Transfusion Status OK TO TRANSFUSE     Crossmatch Result Compatible     Unit Number [id number]     Blood Component Type RED CELLS,LR      Unit division 00     Status of Unit ISSUED,FINAL     Transfusion Status OK TO TRANSFUSE     Crossmatch Result Compatible     Unit Number [id number]     Blood Component Type RED CELLS,LR     Unit division 00     Status of Unit ISSUED     Transfusion Status OK TO TRANSFUSE     Crossmatch Result Compatible     Unit Number [id number]     Blood Component Type RED CELLS,LR     Unit division 00     Status of Unit ALLOCATED     Transfusion Status OK TO TRANSFUSE     Crossmatch Result Compatible    ABO/RH     Status: None   Collection Time    09/09/13  1:31 AM      Result Value Range   ABO/RH(D) A POS    PREPARE FRESH FROZEN PLASMA     Status: None   Collection Time    09/09/13  2:20 AM      Result Value Range   Unit Number [id number]     Blood Component Type THAWED PLASMA     Unit division 00     Status of Unit ISSUED,FINAL     Transfusion Status OK TO TRANSFUSE     Unit Number [id number]     Blood Component Type THAWED PLASMA     Unit division 00     Status of Unit ISSUED,FINAL     Transfusion Status OK TO TRANSFUSE    MRSA PCR SCREENING     Status: Abnormal   Collection Time    09/09/13  4:29 AM      Result Value Range   MRSA by PCR POSITIVE (*) NEGATIVE   Comment:            The GeneXpert MRSA Assay (FDA     approved for NASAL specimens     only), is one component of a     comprehensive MRSA colonization     surveillance program. It is not     intended to diagnose MRSA     infection nor to guide or     monitor treatment for     MRSA infections.     RESULT CALLED TO, READ BACK BY AND VERIFIED WITH:     Dorris Fetch 09/09/13 0825 BY K SCHULTZ     Performed at [facility]  CBC     Status: Abnormal   Collection Time    09/09/13  5:10 AM      Result Value Range   WBC 10.3  4.0 - 10.5 K/uL  RBC 2.65 (*) 3.87 - 5.11 MIL/uL   Hemoglobin 6.2 (*) 12.0 - 15.0 g/dL   Comment: DELTA CHECK NOTED     REPEATED TO VERIFY     CRITICAL RESULT CALLED TO, READ  BACK BY AND VERIFIED WITH:     VERGELDEDIOS,L/4E @0601  ON 09/09/13 BY KARCZEWSKI,S.   HCT 20.7 (*) 36.0 - 46.0 %   MCV 78.1  78.0 - 100.0 fL   MCH 23.4 (*) 26.0 - 34.0 pg   MCHC 30.0  30.0 - 36.0 g/dL   RDW 40.9 (*) 81.1 - 91.4 %   Platelets 237  150 - 400 K/uL  PROTIME-INR     Status: Abnormal   Collection Time    09/09/13  5:10 AM      Result Value Range   Prothrombin Time 25.9 (*) 11.6 - 15.2 seconds   INR 2.47 (*) 0.00 - 1.49  BASIC METABOLIC PANEL     Status: Abnormal   Collection Time    09/09/13  5:10 AM      Result Value Range   Sodium 142  135 - 145 mEq/L   Potassium 4.5  3.5 - 5.1 mEq/L   Chloride 111  96 - 112 mEq/L   CO2 24  19 - 32 mEq/L   Glucose, Bld 160 (*) 70 - 99 mg/dL   BUN 32 (*) 6 - 23 mg/dL   Creatinine, Ser 7.82 (*) 0.50 - 1.10 mg/dL   Calcium 8.2 (*) 8.4 - 10.5 mg/dL   GFR calc non Af Amer 25 (*) >90 mL/min   GFR calc Af Amer 29 (*) >90 mL/min   Comment: (NOTE)     The eGFR has been calculated using the CKD EPI equation.     This calculation has not been validated in all clinical situations.     eGFR's persistently <90 mL/min signify possible Chronic Kidney     Disease.  PREPARE RBC (CROSSMATCH)     Status: None   Collection Time    09/09/13  7:00 AM      Result Value Range   Order Confirmation ORDER PROCESSED BY BLOOD BANK    PROTIME-INR     Status: Abnormal   Collection Time    09/09/13 12:36 PM      Result Value Range   Prothrombin Time 18.5 (*) 11.6 - 15.2 seconds   INR 1.59 (*) 0.00 - 1.49  COMPREHENSIVE METABOLIC PANEL     Status: Abnormal   Collection Time    09/09/13  4:15 PM      Result Value Range   Sodium 144  135 - 145 mEq/L   Potassium 5.0  3.5 - 5.1 mEq/L   Chloride 113 (*) 96 - 112 mEq/L   CO2 24  19 - 32 mEq/L   Glucose, Bld 92  70 - 99 mg/dL   BUN 33 (*) 6 - 23 mg/dL   Creatinine, Ser 9.56 (*) 0.50 - 1.10 mg/dL   Calcium 8.0 (*) 8.4 - 10.5 mg/dL   Total Protein 5.7 (*) 6.0 - 8.3 g/dL   Albumin 2.1 (*) 3.5 - 5.2 g/dL    AST 11  0 - 37 U/L   ALT 9  0 - 35 U/L   Alkaline Phosphatase 85  39 - 117 U/L   Total Bilirubin 0.4  0.3 - 1.2 mg/dL   GFR calc non Af Amer 24 (*) >90 mL/min   GFR calc Af Amer 27 (*) >90 mL/min   Comment: (NOTE)  The eGFR has been calculated using the CKD EPI equation.     This calculation has not been validated in all clinical situations.     eGFR's persistently <90 mL/min signify possible Chronic Kidney     Disease.  HEMOGLOBIN AND HEMATOCRIT, BLOOD     Status: Abnormal   Collection Time    09/09/13  4:15 PM      Result Value Range   Hemoglobin 5.7 (*) 12.0 - 15.0 g/dL   Comment: REPEATED TO VERIFY     CRITICAL RESULT CALLED TO, READ BACK BY AND VERIFIED WITH:     Isaac Bliss RN 1610 09/09/13 A NAVARRO   HCT 18.1 (*) 36.0 - 46.0 %  PREPARE RBC (CROSSMATCH)     Status: None   Collection Time    09/09/13  6:00 PM      Result Value Range   Order Confirmation ORDER PROCESSED BY BLOOD BANK    HEMOGLOBIN AND HEMATOCRIT, BLOOD     Status: Abnormal   Collection Time    09/10/13  5:25 AM      Result Value Range   Hemoglobin 6.7 (*) 12.0 - 15.0 g/dL   Comment: CRITICAL VALUE NOTED.  VALUE IS CONSISTENT WITH PREVIOUSLY REPORTED AND CALLED VALUE.   HCT 20.5 (*) 36.0 - 46.0 %  PREPARE RBC (CROSSMATCH)     Status: None   Collection Time    09/10/13  8:29 AM      Result Value Range   Order Confirmation ORDER PROCESSED BY BLOOD BANK      Ct Tibia Fibula Right Wo Contrast  09/09/2013   CLINICAL DATA:  Evaluate calf hematoma.  EXAM: CT OF THE RIGHT TIBIA FIBULA WITHOUT CONTRAST  TECHNIQUE: Imaging through the right calf without intravenous contrast. Multiplanar reconstructed images were performed and reviewed.  COMPARISON:  Plain films 03/10/2012  FINDINGS: There is a large complex fluid collection within the subcutaneous soft tissues of the right calf posterior medially. This measures 20 x 15 x 8 cm. Areas of internal layering high density. Findings most likely reflective of large  partially liquified hematoma. This remains in the subcutaneous soft tissues. No hematoma within the deep tissues. No intramuscular abnormality. No acute bony abnormality.  IMPRESSION: Large subcutaneous posterior medial calf hematoma.   Electronically Signed   By: Charlett Nose M.D.   On: 09/09/2013 02:38    Review of Systems  HENT: Negative.   Eyes: Negative.   Respiratory: Negative.   Cardiovascular: Negative.   Gastrointestinal: Negative.   Genitourinary: Negative.   Musculoskeletal: Negative.   Skin: Negative.   Neurological: Positive for weakness.  Endo/Heme/Allergies: Bruises/bleeds easily.  Psychiatric/Behavioral: Negative.    Blood pressure 146/37, pulse 60, temperature 97.8 F (36.6 C), temperature source Oral, resp. rate 18, height 5\' 3"  (1.6 m), weight 280 lb 1.6 oz (127.053 kg), SpO2 100.00%. Physical Exam  Constitutional: She is oriented to person, place, and time.  Morbidly obese bf in nad  HENT:  Head: Normocephalic and atraumatic.  Eyes: Conjunctivae and EOM are normal. Pupils are equal, round, and reactive to light.  Neck: Normal range of motion. Neck supple.  Cardiovascular:  Irregular rhythm  Respiratory: Effort normal and breath sounds normal.  GI: Soft. Bowel sounds are normal.  Musculoskeletal:  There is severe changes of venous insufficiency in both legs. There is a large hematoma posteriorly in right calf in subcutaneous tissue. Foot is warm. Very edematous legs bilaterally  Neurological: She is alert and oriented to person, place, and time.  Skin: Skin is warm and dry.  Psychiatric: She has a normal mood and affect. Her behavior is normal.    Assessment/Plan: The pt has a large subq hematoma in right calf. Ortho is tied up and has asked if we can evacuate hematoma and I think this is reasonable thing to do. I have discussed the risks and benefits of this with the pt and she understands and wishes to proceed. This includes the possibility of losing the leg  if her wounds do not heal which is a possibility given her overall poor state of health  TOTH III,PAUL S 09/10/2013, 9:50 AM

## 2013-09-10 NOTE — Progress Notes (Addendum)
Patient ID: [name], female   DOB: [birth year], 62 y.o.   MRN: [id number] [facility] PROGRESS NOTE  EKAM BONEBRAKE NWG:[id number] DOB: 14-Feb-1951 DOA: 09/08/2013 PCP: Willey Blade, MD  Brief narrative: 62 yo female on coumadin for afib presented to [facility] ED with main concern of progressively enlarging right lower extremity, calf area. She reports history of hematoma in right calf but it sappers to have now gotten much bigger and more painful. Pt denies trauma to the leg, no chest pain or shortness of breath, no abdominal or urinary concerns, no specific focal neurological symptoms. In ED, INR > 4, received one dose of VIt k and [facility] asked to admit for further evaluation and management. Ortho team consulted as well.   Principal Problem:   Spontaneous hematoma of lower leg - further drop in Hg 7.2 --> 6.2 --> 5.7 --> 6.7 - pt has received total of 4 units of PRBC so fare since admission - will go ahead and order 2 more units of PRBC prior to surgery - plan for OR today for evacuation of hematoma  Active Problems:   HYPERTENSION - slightly above target range and likely from spontaneously bleeding hematoma - will monitor closely and place on antihypertensive home regimen post op - pt is on hydralazine, metoprolol, Norvasc    DM (diabetes mellitus) - last A1C 09/10 was 5.9 - will continue Lantus per home medical regimen once pt tolerating PO and will place on SSI   Chronic anticoagulation - Coumadin on hold, monitor on telemetry    CKD (chronic kidney disease), stage IV - Cr on admission 2.33 -> 2.13 - monitor closely   Anemia of chronic disease - pt on iron and will resume - will also add 2 more units of PRBC as noted above due to bleeding hematoma    PAF (paroxysmal atrial fibrillation) - rate controlled - continue Metoprolol    Morbid obesity - nutrition consultation once pt post op   Pt placed on Levaquin on admission, no clear reason noted, will check UA and CXR today.    Consultants:  Surgery  Procedures/Studies: Ct Tibia Fibula Right Wo Contrast 09/09/2013   Large subcutaneous posterior medial calf hematoma.   Antibiotics:  Levaquin 10/03 -->  Code Status: Full Family Communication: Pt at bedside Disposition Plan: Home when medically stable  HPI/Subjective: No events overnight.   Objective: Filed Vitals:   09/10/13 1105 09/10/13 1149 09/10/13 1300 09/10/13 1312  BP: 137/43 156/50 155/42   Pulse: 69 70 77   Temp: 98.1 F (36.7 C) 98.2 F (36.8 C) 98.6 F (37 C) 99.3 F (37.4 C)  TempSrc: Oral Oral Oral Oral  Resp: 16 16 16 16   Height:      Weight:      SpO2: 100% 100% 97%     Intake/Output Summary (Last 24 hours) at 09/10/13 1413 Last data filed at 09/10/13 1400  Gross per 24 hour  Intake 998.75 ml  Output   1100 ml  Net -101.25 ml    Exam:   General:  Pt is alert, follows commands appropriately, not in acute distress  Cardiovascular: Regular rate and rhythm, S1/S2, no murmurs, no rubs, no gallops  Respiratory: Clear to auscultation bilaterally, no wheezing, no crackles, no rhonchi  Abdomen: NT/ND, BS +  Neuro: Grossly nonfocal  Data Reviewed: Basic Metabolic Panel:  Recent Labs Lab 09/09/13 0015 09/09/13 0510 09/09/13 1615  NA 146* 142 144  K 4.1 4.5 5.0  CL 113* 111 113*  CO2  --  24 24  GLUCOSE 115* 160* 92  BUN 34* 32* 33*  CREATININE 2.30* 2.00* 2.13*  CALCIUM  --  8.2* 8.0*   Liver Function Tests:  Recent Labs Lab 09/09/13 1615  AST 11  ALT 9  ALKPHOS 85  BILITOT 0.4  PROT 5.7*  ALBUMIN 2.1*   CBC:  Recent Labs Lab 09/08/13 2355 09/09/13 0015 09/09/13 0510 09/09/13 1615 09/10/13 0525  WBC 10.4  --  10.3  --   --   NEUTROABS 8.8*  --   --   --   --   HGB 7.2* 7.8* 6.2* 5.7* 6.7*  HCT 23.5* 23.0* 20.7* 18.1* 20.5*  MCV 78.1  --  78.1  --   --   PLT 303  --  237  --   --    CBG:  Recent Labs Lab 09/08/13 2306 09/10/13 1257  GLUCAP 109* 85    Recent Results (from the past  240 hour(s))  MRSA PCR SCREENING     Status: Abnormal   Collection Time    09/09/13  4:29 AM      Result Value Range Status   MRSA by PCR POSITIVE (*) NEGATIVE Final   Comment:            The GeneXpert MRSA Assay (FDA     approved for NASAL specimens     only), is one component of a     comprehensive MRSA colonization     surveillance program. It is not     intended to diagnose MRSA     infection nor to guide or     monitor treatment for     MRSA infections.     RESULT CALLED TO, READ BACK BY AND VERIFIED WITH:     Dorris Fetch 09/09/13 0825 BY K SCHULTZ     Performed at  [facility]     Scheduled Meds: . [facility] HOLD] amiodarone  200 mg Oral Daily  . [facility] HOLD] atorvastatin  20 mg Oral QHS  . [[facility] HOLD] Chlorhexidine Gluconate Cloth  6 each Topical Q0600  . [[facility] HOLD] hydrALAZINE  50 mg Oral TID  . [facility] HOLD] levofloxacin  250 mg Oral Daily  . [facility] HOLD] mupirocin ointment  1 application Nasal BID  . [[facility] HOLD] pantoprazole  40 mg Oral Daily  . [[facility] HOLD] sodium chloride  3 mL Intravenous Q12H  . [facility] HOLD] sodium chloride  3 mL Intravenous Q12H  . vancomycin  1,500 mg Intravenous 90 min Pre-Op  . [DISCONTINUED] vancomycin  1,000 mg Intravenous Once   Continuous Infusions:    Debbora Presto, MD  [facility] Pager [phone]  If 7PM-7AM, please contact night-coverage [email or web address] Password [email or web address] 09/10/2013, 2:13 PM   LOS: 2 days

## 2013-09-10 NOTE — Transfer of Care (Signed)
Immediate Anesthesia Transfer of Care Note  Patient: [name]  Procedure(s) Performed: Procedure(s): EVACUATION OF RIGHT LEG HEMATOMA AND EXCISION DEBRIDIMENT RIGHT LEG  (Right)  Patient Location: PACU  Anesthesia Type:General  Level of Consciousness: awake and alert   Airway & Oxygen Therapy: Patient Spontanous Breathing and Patient connected to face mask oxygen  Post-op Assessment: Report given to PACU RN and Post -op Vital signs reviewed and stable  Post vital signs: Reviewed and stable  Complications: No apparent anesthesia complications

## 2013-09-10 NOTE — Progress Notes (Signed)
Pt. hgb 6.7 after 2 units PRBCs.  Patient asymptomatic.  RLL cool to touch.  MD paged with findings and that bleeding was controlled at this time.  Will continue to monitor.

## 2013-09-10 NOTE — Progress Notes (Signed)
On-call surgeon updated on patient's dressing saturation with blood.  Vital signs stable.  New orders received.  Will continue to monitor.   Ellene Route 09/10/2013

## 2013-09-10 NOTE — Progress Notes (Signed)
Report from [place], [place]. Pt returned from OR in bed, A&Ox4. Ace wrap to surgical wound to rt calf c/d/i. Medicated for 5/10 surgical pain to rt calf. VSS. No c/o at present. [place]bell in reach.

## 2013-09-10 NOTE — Anesthesia Postprocedure Evaluation (Signed)
  Anesthesia Post-op Note  Patient: [name]  Procedure(s) Performed: Procedure(s) (LRB): EVACUATION OF RIGHT LEG HEMATOMA AND EXCISION DEBRIDIMENT RIGHT LEG  (Right)  Patient Location: PACU  Anesthesia Type: General  Level of Consciousness: awake and alert   Airway and Oxygen Therapy: Patient Spontanous Breathing  Post-op Pain: mild  Post-op Assessment: Post-op Vital signs reviewed, Patient's Cardiovascular Status Stable, Respiratory Function Stable, Patent Airway and No signs of Nausea or vomiting  Last Vitals:  Filed Vitals:   09/10/13 1500  BP: 155/47  Pulse: 68  Temp:   Resp: 16    Post-op Vital Signs: stable   Complications: No apparent anesthesia complications

## 2013-09-10 NOTE — Op Note (Signed)
09/08/2013 - 09/10/2013  2:32 PM  PATIENT:  [name]  62 y.o. female  PRE-OPERATIVE DIAGNOSIS:  right leg hematoma  POST-OPERATIVE DIAGNOSIS:  right leg hematoma  PROCEDURE:  Procedure(s): EVACUATION OF RIGHT LEG HEMATOMA AND EXCISION DEBRIDIMENT RIGHT LEG  (Right)  SURGEON:  Surgeon(s) and Role:    * Robyne Askew, MD - Primary  PHYSICIAN ASSISTANT:   ASSISTANTS: none   ANESTHESIA:   general  EBL:  Total I/O In: 609.2 [I.V.:250; Blood:359.2] Out: 450 [Urine:450]  BLOOD ADMINISTERED:200 CC PRBC  DRAINS: none   LOCAL MEDICATIONS USED:  NONE  SPECIMEN:  No Specimen  DISPOSITION OF SPECIMEN:  N/A  COUNTS:  YES  TOURNIQUET:  * No tourniquets in log *  DICTATION: .Dragon Dictation After informed consent was obtained the patient was brought to the operating room and placed in the supine position on the operating room table. After adequate induction of general anesthesia the patient's right leg was prepped with Betadine and draped in usual sterile manner. In the posterior calf area there was a large hematoma. Medially the skin was already separated from the tension of the hematoma. This opening was probed with blunt finger dissection until the hematoma cavity was entered. A large hematoma was evacuated without difficulty. The skin overlying the hematoma on the posterior calf was obviously dead. The skin was removed sharply with the electrocautery. No obvious bleeding vessel was identified but too small veins were identified that were transected by the hematoma and these were cauterized. The wound was then irrigated with a liter of pulse lavage. The wound was then packed with a moistened Kerlix gauze. A dry Kerlix gauze and an Ace wrap were then wrapped around the wound. The patient tolerated the procedure well. At the end of the case all needle sponge and instrument counts were correct. The patient was then awakened and taken to recovery in stable condition.  PLAN OF  CARE: Admit to inpatient   PATIENT DISPOSITION:  PACU - hemodynamically stable.   Delay start of Pharmacological VTE agent (>24hrs) due to surgical blood loss or risk of bleeding: yes

## 2013-09-10 NOTE — Progress Notes (Signed)
   Subjective: Day of Surgery Procedure(s) (LRB): EVACUATION OF RIGHT LEG HEMATOMA AND EXCISION DEBRIDIMENT RIGHT LEG  (Right)   Patient reports pain as mild, pain better this morning. States that there is less pressure and pain now than compared to yesterday.   Objective:   VITALS:   Filed Vitals:   09/10/13 0900  BP: 146/37  Pulse: 60  Temp: 97.8 F (36.6 C)  Resp: 18    There is a ~ 3x9 cm opening on the medial aspect of the right calf. The area is open more than it was yesterday. Exposed tissues are red, but no active bleeding seen. Blisters that were on the posterior aspect of the calf look to have also decreased from yesterday. Decreases pain on palpation as compared to yesterday  LABS  Recent Labs  09/08/13 2355  09/09/13 0510 09/09/13 1615 09/10/13 0525  HGB 7.2*  < > 6.2* 5.7* 6.7*  HCT 23.5*  < > 20.7* 18.1* 20.5*  WBC 10.4  --  10.3  --   --   PLT 303  --  237  --   --   < > = values in this interval not displayed.   Recent Labs  09/09/13 0015 09/09/13 0510 09/09/13 1615  NA 146* 142 144  K 4.1 4.5 5.0  BUN 34* 32* 33*  CREATININE 2.30* 2.00* 2.13*  GLUCOSE 115* 160* 92     Assessment/Plan: Day of Surgery Procedure(s) (LRB): EVACUATION OF RIGHT LEG HEMATOMA AND EXCISION DEBRIDIMENT RIGHT LEG  (Right)  NPO now with plans for surgery Dr. Charlann Boxer discussing with other surgeon to see if she can be done earlier. Plan would be to I&D the hematoma of the right calf Discussed with nurse that if they want to give her more blood, they can start that as soon as desired.    Anastasio Auerbach Genola Yuille   PAC  09/10/2013, 9:34 AM

## 2013-09-10 NOTE — Progress Notes (Signed)
Per MD, it is okay to transfuse patient with PRBC over 2 hours with IV lasix dose between units prior to surgery.

## 2013-09-10 NOTE — Progress Notes (Signed)
Patient is not currently active with [facility] Care Management services.  She receives home visits monthly from a [facility] Management nurse.  She has also been active with [facility].  Of note, [facility] Care Management services does not replace or interfere with any services that are arranged by inpatient case management or social work.  For additional questions or referrals please contact Anibal Henderson BSN RN [facility] Liaison at [phone].

## 2013-09-11 LAB — BASIC METABOLIC PANEL
CO2: 22 mEq/L (ref 19–32)
Calcium: 7.5 mg/dL — ABNORMAL LOW (ref 8.4–10.5)
Chloride: 112 mEq/L (ref 96–112)
Creatinine, Ser: 3.01 mg/dL — ABNORMAL HIGH (ref 0.50–1.10)
Glucose, Bld: 118 mg/dL — ABNORMAL HIGH (ref 70–99)
Potassium: 4.4 mEq/L (ref 3.5–5.1)
Sodium: 141 mEq/L (ref 135–145)

## 2013-09-11 LAB — CBC
Hemoglobin: 8.4 g/dL — ABNORMAL LOW (ref 12.0–15.0)
MCH: 28.2 pg (ref 26.0–34.0)
MCV: 85.2 fL (ref 78.0–100.0)
Platelets: 193 10*3/uL (ref 150–400)
RBC: 2.98 MIL/uL — ABNORMAL LOW (ref 3.87–5.11)
RDW: 17.6 % — ABNORMAL HIGH (ref 11.5–15.5)
WBC: 10.8 10*3/uL — ABNORMAL HIGH (ref 4.0–10.5)

## 2013-09-11 LAB — PREPARE RBC (CROSSMATCH)

## 2013-09-11 LAB — GLUCOSE, CAPILLARY
Glucose-Capillary: 93 mg/dL (ref 70–99)
Glucose-Capillary: 150 mg/dL — ABNORMAL HIGH (ref 70–99)

## 2013-09-11 MED ORDER — SODIUM CHLORIDE 0.9 % IJ SOLN
10.0000 mL | Freq: Two times a day (BID) | INTRAMUSCULAR | Status: DC
  Administered 2013-09-11 – 2013-09-20 (×9): 10 mL via INTRAVENOUS

## 2013-09-11 MED ORDER — OXYCODONE-ACETAMINOPHEN 5-325 MG PO TABS
1.0000 | ORAL_TABLET | ORAL | Status: DC | PRN
  Administered 2013-09-11 – 2013-09-12 (×3): 2 via ORAL
  Administered 2013-09-13: 1 via ORAL
  Administered 2013-09-13: 2 via ORAL
  Administered 2013-09-14: 1 via ORAL
  Administered 2013-09-14: 2 via ORAL
  Administered 2013-09-14 – 2013-09-22 (×7): 1 via ORAL
  Filled 2013-09-11 (×3): qty 1
  Filled 2013-09-11: qty 2
  Filled 2013-09-11: qty 1
  Filled 2013-09-11 (×5): qty 2
  Filled 2013-09-11: qty 1
  Filled 2013-09-11 (×3): qty 2
  Filled 2013-09-11 (×2): qty 1

## 2013-09-11 MED ORDER — SODIUM CHLORIDE 0.9 % IV SOLN
INTRAVENOUS | Status: AC
  Administered 2013-09-11: 15:00:00 via INTRAVENOUS

## 2013-09-11 NOTE — Progress Notes (Addendum)
Patient ID: [name], female   DOB: 27-Mar-1951, 62 y.o.   MRN: [id number]  [facility] PROGRESS NOTE  SURIAH PERAGINE FAO:[id number] DOB: May 08, 1951 DOA: 09/08/2013 PCP: Willey Blade, MD  Brief narrative:  62 yo female on coumadin for afib presented to [facility] ED with main concern of progressively enlarging right lower extremity, calf area. She reports history of hematoma in right calf but it sappers to have now gotten much bigger and more painful. Pt denies trauma to the leg, no chest pain or shortness of breath, no abdominal or urinary concerns, no specific focal neurological symptoms. In ED, INR > 4, received one dose of VIt k and [facility] asked to admit for further evaluation and management. Ortho team consulted as well.   Principal Problem:  Spontaneous hematoma of lower leg  - status post evacuation, excision and debridement of right leg hematoma post op day #1, doing well and clinically stable  - further drop in Hg 7.2 --> 6.2 --> 5.7 --> 6.7 --> 7.0 --> Hg pending this AM - pt has received total of 6 units of PRBC so far since admission   - follow up on CBC this AM Active Problems:  HYPERTENSION  - reasonable inpatient control  - pt is on hydralazine, metoprolol, Norvasc  DM (diabetes mellitus)  - last A1C 09/10 was 5.9  - will continue Lantus per home medical regimen and will continue SSI  Chronic anticoagulation  - Coumadin on hold, monitor on telemetry  CKD (chronic kidney disease), stage IV  - Cr on admission 2.33 -> 2.13 --> 3.1 this AM - will stop lasix and give bolus of NS, repeat BMP in AM - no other nephrotoxic agents noted  - monitor closely  Grade II diastolic and systolic dysfunction - last 2 D ECHO in 02/2013, pt is on Lasix 40 mg PO QD - will hold lasix for now given elevation in Creatinine and will provide gentle hydration for 12 hours - strict I's and O's, daily weight  Anemia of chronic disease  - continue Iron and follow up on Hg this AM PAF (paroxysmal  atrial fibrillation)  - rate controlled  - continue Metoprolol  Morbid obesity  - nutrition consultation once pt post op    Consultants:  Surgery  Procedures/Studies:  Ct Tibia Fibula Right Wo Contrast 09/09/2013 Large subcutaneous posterior medial calf hematoma. Dg Chest Port 1 View  09/10/2013   Status post right central line placement without pneumothorax.  Crowding of the vascular markings due to a poor inspiratory effort.  Antibiotics:  Levaquin started on admission, no clear reason --> will discontinue   Code Status: Full Family Communication: Pt at bedside Disposition Plan: Home when medically stable  HPI/Subjective: No events overnight.   Objective: Filed Vitals:   09/11/13 0245 09/11/13 0345 09/11/13 0435 09/11/13 0529  BP: 127/36 130/42 148/50   Pulse: 53 56 57   Temp: 97.7 F (36.5 C) 98 F (36.7 C) 98.1 F (36.7 C)   TempSrc: Oral Oral Oral   Resp: 16 18 16    Height:      Weight:    125.147 kg (275 lb 14.4 oz)  SpO2:   99%     Intake/Output Summary (Last 24 hours) at 09/11/13 0808 Last data filed at 09/11/13 0702  Gross per 24 hour  Intake 2421.67 ml  Output   1175 ml  Net 1246.67 ml    Exam:   General:  Pt is alert, follows commands appropriately, not in acute distress  Cardiovascular: Regular rate and rhythm, S1/S2, no murmurs, no rubs, no gallops  Respiratory: Clear to auscultation bilaterally, no wheezing, no crackles, no rhonchi  Abdomen: Soft, non tender, non distended, bowel sounds present, no guarding  Neuro: Grossly nonfocal  Data Reviewed: Basic Metabolic Panel:  Recent Labs Lab 09/09/13 0015 09/09/13 0510 09/09/13 1615  NA 146* 142 144  K 4.1 4.5 5.0  CL 113* 111 113*  CO2  --  24 24  GLUCOSE 115* 160* 92  BUN 34* 32* 33*  CREATININE 2.30* 2.00* 2.13*  CALCIUM  --  8.2* 8.0*   Liver Function Tests:  Recent Labs Lab 09/09/13 1615  AST 11  ALT 9  ALKPHOS 85  BILITOT 0.4  PROT 5.7*  ALBUMIN 2.1*   CBC:  Recent  Labs Lab 09/08/13 2355  09/09/13 0510 09/09/13 1615 09/10/13 0525 09/10/13 1721 09/10/13 2320  WBC 10.4  --  10.3  --   --   --   --   NEUTROABS 8.8*  --   --   --   --   --   --   HGB 7.2*  < > 6.2* 5.7* 6.7* 8.9* 7.0*  HCT 23.5*  < > 20.7* 18.1* 20.5* 26.4* 20.7*  MCV 78.1  --  78.1  --   --   --   --   PLT 303  --  237  --   --   --   --   < > = values in this interval not displayed.  CBG:  Recent Labs Lab 09/08/13 2306 09/10/13 1257 09/10/13 1442 09/10/13 1709 09/10/13 2123  GLUCAP 109* 85 119* 120* 161*    Recent Results (from the past 240 hour(s))  MRSA PCR SCREENING     Status: Abnormal   Collection Time    09/09/13  4:29 AM      Result Value Range Status   MRSA by PCR POSITIVE (*) NEGATIVE Final   Comment:            The GeneXpert MRSA Assay (FDA     approved for NASAL specimens     only), is one component of a     comprehensive MRSA colonization     surveillance program. It is not     intended to diagnose MRSA     infection nor to guide or     monitor treatment for     MRSA infections.     RESULT CALLED TO, READ BACK BY AND VERIFIED WITH:     Dorris Fetch 09/09/13 0825 BY K SCHULTZ     Performed at [facility]     Scheduled Meds: . allopurinol  100 mg Oral Daily  . amiodarone  200 mg Oral Daily  . amLODipine  10 mg Oral Daily  . atorvastatin  20 mg Oral QHS  . Chlorhexidine Gluconate Cloth  6 each Topical Q0600  . ferrous sulfate  325 mg Oral TID WC  . furosemide  40 mg Oral Daily  . hydrALAZINE  50 mg Oral TID  . insulin aspart  0-9 Units Subcutaneous TID WC  . insulin glargine  20 Units Subcutaneous BID  . levofloxacin  250 mg Oral Daily  . metoprolol tartrate  25 mg Oral BID  . mupirocin ointment  1 application Nasal BID  . pantoprazole  40 mg Oral Daily  . sodium chloride  3 mL Intravenous Q12H  . sodium chloride  3 mL Intravenous Q12H   Continuous Infusions:    MAGICK-Kavin Weckwerth,  Sherlon Handing, MD  [facility] Pager [phone]  If 7PM-7AM, please  contact night-coverage [email or web address] Password [email or web address] 09/11/2013, 8:08 AM   LOS: 3 days

## 2013-09-11 NOTE — Progress Notes (Signed)
General Surgery Note  LOS: 3 days  POD -  1 Day Post-Op  Assessment/Plan: 1.  EVACUATION OF RIGHT LEG HEMATOMA AND EXCISION DEBRIDIMENT RIGHT LEG - 09/10/2013 - P. Toth  I changed dressing around midnight last night  Dressing looks okay now, will leave dressing as is and consider changing it tomorrow  Hgb last PM - 7.0 - patient transfused 1 unit PRBC - AM Hgb pending.  Note:  In looking at her old chart, her Hgb has ranged from 7.5 to 9 over the last year.   Needs to ambulate to chair.  She uses a walker at home.   2.  Morbid obesity 3.  Chronic LE venous stasis/lymph edema 4.  Anemia, chronic and acute blood loss 5.  Diabetes mellitus 6.  Chronic anti-coagulation 7.  Chronic kidney disease  Creatinine - 2.13 - 09/09/2013 8.  Atrial fibrillation 9.  On Levaquin - ? reason  10.  DVT prophylaxis - on hold because of bleed 11. MRSA - on isolation  Subjective:  No specific complaint. Objective:   Filed Vitals:   09/11/13 0435  BP: 148/50  Pulse: 57  Temp: 98.1 F (36.7 C)  Resp: 16     Intake/Output from previous day:  10/03 0701 - 10/04 0700 In: 2421.7 [I.V.:1700; Blood:721.7] Out: 875 [Urine:875]  Intake/Output this shift:  Total I/O In: 120 [P.O.:120] Out: 300 [Urine:300]   Physical Exam:   General: Obese AA F who is alert and oriented.    HEENT: Normal. Pupils equal. .   Lungs: Clear.   Wound: Right leg wrapped and dry.  Will leave dressing for today and start dressing changes tomorrow.     Lab Results:    Recent Labs  09/08/13 2355  09/09/13 0510  09/10/13 1721 09/10/13 2320  WBC 10.4  --  10.3  --   --   --   HGB 7.2*  < > 6.2*  < > 8.9* 7.0*  HCT 23.5*  < > 20.7*  < > 26.4* 20.7*  PLT 303  --  237  --   --   --   < > = values in this interval not displayed.  BMET   Recent Labs  09/09/13 0510 09/09/13 1615  NA 142 144  K 4.5 5.0  CL 111 113*  CO2 24 24  GLUCOSE 160* 92  BUN 32* 33*  CREATININE 2.00* 2.13*  CALCIUM 8.2* 8.0*     PT/INR   Recent Labs  09/09/13 1236 09/10/13 2320  LABPROT 18.5* 16.5*  INR 1.59* 1.37    ABG  No results found for this basename: PHART, PCO2, PO2, HCO3,  in the last 72 hours   Studies/Results:  Dg Chest Port 1 View  09/10/2013   CLINICAL DATA:  Status post central line placement  EXAM: PORTABLE CHEST - 1 VIEW  COMPARISON:  08/03/2013  FINDINGS: The cardiac shadow remains enlarged. The lungs are incompletely aerated with crowding of the vascular markings. A central line is now seen at the cavoatrial junction. No pneumothorax is noted. No acute bony abnormality is seen.  IMPRESSION: Status post right central line placement without pneumothorax.  Crowding of the vascular markings due to a poor inspiratory effort.   Electronically Signed   By: Alcide Clever M.D.   On: 09/10/2013 15:20     Anti-infectives:   Anti-infectives   Start     Dose/Rate Route Frequency Ordered Stop   09/10/13 1316  vancomycin (VANCOCIN) 1,500 mg in sodium chloride 0.9 %  500 mL IVPB  Status:  Discontinued     1,500 mg 250 mL/hr over 120 Minutes Intravenous 90 min pre-op 09/10/13 1316 09/10/13 1556   09/10/13 1315  vancomycin (VANCOCIN) IVPB 1000 mg/200 mL premix  Status:  Discontinued     1,000 mg 200 mL/hr over 60 Minutes Intravenous  Once 09/10/13 1311 09/10/13 1450   09/09/13 0500  levofloxacin (LEVAQUIN) tablet 250 mg     250 mg Oral Daily 09/09/13 0427        Ovidio Kin, MD, FACS Pager: [phone] [facility] Office: [phone] 09/11/2013

## 2013-09-11 NOTE — Progress Notes (Signed)
Pt up in chair. Transferred to chair x 2 asst. Very limited mobility. Pt c/o of pain with transfer. Would benefit from PT. Vwilliams,rn.

## 2013-09-11 NOTE — Progress Notes (Signed)
Pt's with bp of 125/36. MD made aware. Said to hold hydralazine. Done . Vwilliams,r.

## 2013-09-11 NOTE — Progress Notes (Signed)
Morning labs are reschedule for 08:00 due to blood running.   [name] 09/11/2013

## 2013-09-12 LAB — CBC
HCT: 23.8 % — ABNORMAL LOW (ref 36.0–46.0)
Hemoglobin: 7.5 g/dL — ABNORMAL LOW (ref 12.0–15.0)
MCH: 27.4 pg (ref 26.0–34.0)
MCHC: 31.5 g/dL (ref 30.0–36.0)
MCV: 86.9 fL (ref 78.0–100.0)
RBC: 2.74 MIL/uL — ABNORMAL LOW (ref 3.87–5.11)
WBC: 10.4 10*3/uL (ref 4.0–10.5)

## 2013-09-12 LAB — BASIC METABOLIC PANEL
BUN: 40 mg/dL — ABNORMAL HIGH (ref 6–23)
CO2: 22 mEq/L (ref 19–32)
Calcium: 7.5 mg/dL — ABNORMAL LOW (ref 8.4–10.5)
Creatinine, Ser: 2.78 mg/dL — ABNORMAL HIGH (ref 0.50–1.10)
GFR calc Af Amer: 20 mL/min — ABNORMAL LOW (ref >90)
GFR calc non Af Amer: 17 mL/min — ABNORMAL LOW (ref >90)
Glucose, Bld: 85 mg/dL (ref 70–99)

## 2013-09-12 LAB — PREPARE RBC (CROSSMATCH)

## 2013-09-12 LAB — GLUCOSE, CAPILLARY
Glucose-Capillary: 67 mg/dL — ABNORMAL LOW (ref 70–99)
Glucose-Capillary: 125 mg/dL — ABNORMAL HIGH (ref 70–99)
Glucose-Capillary: 83 mg/dL (ref 70–99)

## 2013-09-12 MED ORDER — POLYETHYLENE GLYCOL 3350 17 G PO PACK
17.0000 g | PACK | Freq: Two times a day (BID) | ORAL | Status: DC
  Administered 2013-09-12 – 2013-09-18 (×11): 17 g via ORAL
  Filled 2013-09-12 (×21): qty 1

## 2013-09-12 MED ORDER — FUROSEMIDE 40 MG PO TABS
40.0000 mg | ORAL_TABLET | Freq: Every day | ORAL | Status: DC
  Administered 2013-09-12 – 2013-09-15 (×4): 40 mg via ORAL
  Filled 2013-09-12 (×4): qty 1

## 2013-09-12 MED ORDER — SENNOSIDES-DOCUSATE SODIUM 8.6-50 MG PO TABS
1.0000 | ORAL_TABLET | Freq: Two times a day (BID) | ORAL | Status: DC
  Administered 2013-09-12 – 2013-09-16 (×8): 1 via ORAL
  Filled 2013-09-12 (×9): qty 1

## 2013-09-12 NOTE — Progress Notes (Signed)
Pt is morbidly obesed with decreased mobility and a recent surgical wound to RLE. Has foley catheter in place with constant reassessment for necessity with no order to keep foley. MD made aware . New order received to keep foley. Helps with wound healing. Vwilliams,rn.

## 2013-09-12 NOTE — Plan of Care (Signed)
Problem: Phase I Progression Outcomes Goal: OOB as tolerated unless otherwise ordered Outcome: Not Progressing Needs PT assistance & eval

## 2013-09-12 NOTE — Progress Notes (Signed)
Hypoglycemic Event  CBG: 67  Treatment: 15 GM carbohydrate snack  Symptoms: None  Follow-up CBG: Time:0851 CBG Result:98  Possible Reasons for Event: Inadequate meal intake  Comments/MD notified:    Benny Lennert  Remember to initiate Hypoglycemia Order Set & complete

## 2013-09-12 NOTE — Evaluation (Signed)
Physical Therapy Evaluation Patient Details Name: [name] MRN: [id number] DOB: [birth year] Today's Date: 09/12/2013 Time: 1036-1100 PT Time Calculation (min): 24 min  PT Assessment / Plan / Recommendation History of Present Illness  62 yo female admitted with R LE hematoma. S/P evacuation adn I&D 10/3. Hx of HTN, DM, P AFib, obesity, chronic venous stasis, lymphedema  Clinical Impression  On eval, pt required Mod assist for mobility (+2 for safety, IV/lines)-able to perform stand pivot to recliner with walker. Pt declined ambulation this session. Pt states plan is for her to return home. Recommend HHPT.     PT Assessment  Patient needs continued PT services    Follow Up Recommendations  Home health PT;Supervision for mobility/OOB    Does the patient have the potential to tolerate intense rehabilitation      Barriers to Discharge        Equipment Recommendations  None recommended by PT    Recommendations for Other Services OT consult   Frequency Min 3X/week    Precautions / Restrictions Precautions Precautions: Fall Precaution Comments: decreased activity tolerance. O2 PRN Restrictions Weight Bearing Restrictions: No   Pertinent Vitals/Pain bil LEs 9/10       Mobility  Bed Mobility Bed Mobility: Supine to Sit Left Sidelying to Sit: 1: +2 Total assist;HOB elevated;With rails Left Sidelying to Sit: Patient Percentage: 70% Details for Bed Mobility Assistance: heavy reliance on rails. Assist for trunk and bil LEs. Increased time and effort.  Transfers Transfers: Sit to Stand;Stand to Sit;Stand Pivot Transfers Sit to Stand: 1: +2 Total assist;From bed Sit to Stand: Patient Percentage: 70% Stand to Sit: 1: +2 Total assist;To chair/3-in-1;With armrests Stand to Sit: Patient Percentage: 70% Details for Transfer Assistance: Increased time. Seated rest at EOB before pt could attempt standing. VCs safety, technique, hand placement. Assist to rise, stabilize, control  descent. Pt declined ambulation this session Ambulation/Gait Ambulation/Gait Assistance: Not tested (comment)    Exercises     PT Diagnosis: Difficulty walking;Abnormality of gait;Generalized weakness;Acute pain  PT Problem List: Decreased strength;Decreased activity tolerance;Cardiopulmonary status limiting activity;Pain;Decreased mobility PT Treatment Interventions: DME instruction;Gait training;Functional mobility training;Therapeutic activities;Therapeutic exercise;Patient/family education     PT Goals(Current goals can be found in the care plan section) Acute Rehab PT Goals Patient Stated Goal: home. less pain PT Goal Formulation: With patient Time For Goal Achievement: 09/26/13 Potential to Achieve Goals: Good  Visit Information  Last PT Received On: 09/12/13 Assistance Needed: +2 (safety) History of Present Illness: 62 yo female admitted with R LE hematoma. S/P evacuation adn I&D 10/3. Hx of HTN, DM, P AFib, obesity, chronic venous stasis, lymphedema       Prior Functioning       Cognition  Cognition Arousal/Alertness: Awake/alert Behavior During Therapy: WFL for tasks assessed/performed Overall Cognitive Status: Within Functional Limits for tasks assessed    Extremity/Trunk Assessment Lower Extremity Assessment Lower Extremity Assessment: Generalized weakness LLE Deficits / Details: Limited by pain, body habitus and generalized weakness Cervical / Trunk Assessment Cervical / Trunk Assessment: Normal   Balance    End of Session PT - End of Session Activity Tolerance: Patient limited by pain;Patient limited by fatigue Patient left: in chair;with call bell/phone within reach (bil LEs elevated) Nurse Communication: Mobility status  GP     Rebeca Alert, MPT Pager: [phone]

## 2013-09-12 NOTE — Progress Notes (Signed)
Patient ID: [name], female   DOB: [birth year], 62 y.o.   MRN: [id number] [facility] PROGRESS NOTE  ABISAI COBLE NGE:[id number] DOB: [birth year] DOA: 09/08/2013 PCP: Willey Blade, MD Brief narrative:  62 yo female on coumadin for afib presented to [facility] ED with main concern of progressively enlarging right lower extremity, calf area. She reports history of hematoma in right calf but it sappers to have now gotten much bigger and more painful. Pt denies trauma to the leg, no chest pain or shortness of breath, no abdominal or urinary concerns, no specific focal neurological symptoms. In ED, INR > 4, received one dose of VIt k and [facility] asked to admit for further evaluation and management. Ortho team consulted as well.   Principal Problem:  Spontaneous hematoma of lower leg  - status post evacuation, excision and debridement of right leg hematoma post op day #2, doing well and clinically stable  - further drop in Hg 7.2 --> 6.2 --> 5.7 --> 6.7 --> 7.0 --> 8.4 --> 7.5 - pt has received total of 6 units of PRBC so far since admission  - will order additional 1 unit of blood and repeat post transfusion H/H - follow up on CBC this AM  Active Problems:  HYPERTENSION  - reasonable inpatient control  - pt is on hydralazine, metoprolol, Norvasc  DM (diabetes mellitus)  - last A1C 09/10 was 5.9  - will continue Lantus per home medical regimen and will continue SSI  Chronic anticoagulation  - Coumadin on hold, monitor on telemetry  CKD (chronic kidney disease), stage IV  - Cr on admission 2.33 -> 2.13 --> 3.1 --> 2.78 this AM - I have temporarily stopped Lasix and will be able to resume today as weigh is trending up 275 lbs (10/04) --> 282 lbs this AM - no other nephrotoxic agents noted  - monitor closely  Grade II diastolic and systolic dysfunction  - last 2 D ECHO in 02/2013, pt is on Lasix 40 mg PO QD  - Cr trending down but weight up since yesterday, resume Lasix 40 mg PO QD - strict  I's and O's, daily weight  Anemia of chronic disease  - continue Iron and follow up on Hg this AM  PAF (paroxysmal atrial fibrillation)  - rate controlled  - continue Metoprolol  Morbid obesity  - nutrition consultation once pt post op   Consultants:  Surgery  Procedures/Studies:  Ct Tibia Fibula Right Wo Contrast 09/09/2013 Large subcutaneous posterior medial calf hematoma.  Dg Chest Port 1 View 09/10/2013 Status post right central line placement without pneumothorax. Crowding of the vascular markings due to a poor inspiratory effort.  Antibiotics:   Levaquin started on admission, no clear reason --> will discontinue   Code Status: Full  Family Communication: Pt at bedside  Disposition Plan: Home when medically stable    HPI/Subjective: No events overnight.   Objective: Filed Vitals:   09/11/13 1702 09/11/13 2032 09/12/13 0416 09/12/13 0432  BP: 125/36 142/40 158/45   Pulse: 60 63 58   Temp:  99 F (37.2 C) 99.2 F (37.3 C)   TempSrc:  Oral Oral   Resp:  20 18   Height:      Weight:    128.005 kg (282 lb 3.2 oz)  SpO2:  99% 100%     Intake/Output Summary (Last 24 hours) at 09/12/13 0739 Last data filed at 09/12/13 0524  Gross per 24 hour  Intake   1810 ml  Output  400 ml  Net   1410 ml    Exam:   General:  Pt is alert, follows commands appropriately, not in acute distress  Cardiovascular: Regular rate and rhythm, S1/S2, no murmurs, no rubs, no gallops  Respiratory: Clear to auscultation bilaterally, no wheezing, no crackles, no rhonchi  Abdomen: Soft, non tender, non distended, bowel sounds present, no guarding  Neuro: Grossly nonfocal  Data Reviewed: Basic Metabolic Panel:  Recent Labs Lab 09/09/13 0015 09/09/13 0510 09/09/13 1615 09/11/13 0900 09/12/13 0500  NA 146* 142 144 141 140  K 4.1 4.5 5.0 4.4 4.8  CL 113* 111 113* 112 110  CO2  --  24 24 22 22   GLUCOSE 115* 160* 92 118* 85  BUN 34* 32* 33* 39* 40*  CREATININE 2.30* 2.00* 2.13*  3.01* 2.78*  CALCIUM  --  8.2* 8.0* 7.5* 7.5*   Liver Function Tests:  Recent Labs Lab 09/09/13 1615  AST 11  ALT 9  ALKPHOS 85  BILITOT 0.4  PROT 5.7*  ALBUMIN 2.1*   CBC:  Recent Labs Lab 09/08/13 2355  09/09/13 0510  09/10/13 0525 09/10/13 1721 09/10/13 2320 09/11/13 0900 09/12/13 0500  WBC 10.4  --  10.3  --   --   --   --  10.8* 10.4  NEUTROABS 8.8*  --   --   --   --   --   --   --   --   HGB 7.2*  < > 6.2*  < > 6.7* 8.9* 7.0* 8.4* 7.5*  HCT 23.5*  < > 20.7*  < > 20.5* 26.4* 20.7* 25.4* 23.8*  MCV 78.1  --  78.1  --   --   --   --  85.2 86.9  PLT 303  --  237  --   --   --   --  193 200  < > = values in this interval not displayed. CBG:  Recent Labs Lab 09/11/13 0735 09/11/13 1148 09/11/13 1635 09/11/13 1638 09/11/13 2106  GLUCAP 93 100* 118* 107* 150*    Recent Results (from the past 240 hour(s))  MRSA PCR SCREENING     Status: Abnormal   Collection Time    09/09/13  4:29 AM      Result Value Range Status   MRSA by PCR POSITIVE (*) NEGATIVE Final   Comment:            The GeneXpert MRSA Assay (FDA     approved for NASAL specimens     only), is one component of a     comprehensive MRSA colonization     surveillance program. It is not     intended to diagnose MRSA     infection nor to guide or     monitor treatment for     MRSA infections.     RESULT CALLED TO, READ BACK BY AND VERIFIED WITH:     Dorris Fetch 09/09/13 0825 BY K SCHULTZ     Performed at [facility]     Scheduled Meds: . allopurinol  100 mg Oral Daily  . amiodarone  200 mg Oral Daily  . amLODipine  10 mg Oral Daily  . atorvastatin  20 mg Oral QHS  . Chlorhexidine Gluconate Cloth  6 each Topical Q0600  . ferrous sulfate  325 mg Oral TID WC  . hydrALAZINE  50 mg Oral TID  . insulin aspart  0-9 Units Subcutaneous TID WC  . insulin glargine  20 Units Subcutaneous  BID  . metoprolol tartrate  25 mg Oral BID  . mupirocin ointment  1 application Nasal BID  . pantoprazole   40 mg Oral Daily  . sodium chloride  10 mL Intravenous Q12H  . sodium chloride  3 mL Intravenous Q12H  . sodium chloride  3 mL Intravenous Q12H   Continuous Infusions:    Debbora Presto, MD  [facility] Pager [phone]  If 7PM-7AM, please contact night-coverage [email or web address] Password [email or web address] 09/12/2013, 7:39 AM   LOS: 4 days

## 2013-09-12 NOTE — Progress Notes (Addendum)
General Surgery Note  LOS: 4 days  POD -  2 Days Post-Op  Assessment/Plan 1.  EVACUATION OF RIGHT LEG HEMATOMA AND EXCISION DEBRIDIMENT RIGHT LEG - 09/10/2013 - P. Carolynne Edouard  I debrided 2 x 8 cm of skin off the posterior aspect of the wound.  Otherwise the wound looks good.  Photo in chart under my PE.   2.  Morbid obesity 3.  Chronic LE venous stasis/lymph edema 4.  Anemia, chronic and acute blood loss 5.  Diabetes mellitus  Hypoglycemic this am. 6.  Chronic anti-coagulation 7.  Chronic kidney disease  Creatinine - 2.13 - 09/09/2013 8.  Atrial fibrillation 9.  On Levaquin - ? reason  10.  DVT prophylaxis - on hold because of bleed 11. MRSA - on isolation  Subjective:  She did get out of bed yesterday. Objective:   Filed Vitals:   09/12/13 0928  BP: 131/46  Pulse: 60  Temp: 97.9 F (36.6 C)  Resp: 18     Intake/Output from previous day:  10/04 0701 - 10/05 0700 In: 1810 [P.O.:1080; I.V.:730] Out: 700 [Urine:700]  Intake/Output this shift:  Total I/O In: 370 [P.O.:120; I.V.:250] Out: -    Physical Exam:   General: Obese AA F who is alert and oriented.    HEENT: Normal. Pupils equal. .   Lungs: Clear.   Wound: Open wound apporx 18 x 14 cm.  Posterior skin is dead and I debrided it at the bedside.       Lab Results:     Recent Labs  09/11/13 0900 09/12/13 0500  WBC 10.8* 10.4  HGB 8.4* 7.5*  HCT 25.4* 23.8*  PLT 193 200    BMET    Recent Labs  09/11/13 0900 09/12/13 0500  NA 141 140  K 4.4 4.8  CL 112 110  CO2 22 22  GLUCOSE 118* 85  BUN 39* 40*  CREATININE 3.01* 2.78*  CALCIUM 7.5* 7.5*    PT/INR    Recent Labs  09/09/13 1236 09/10/13 2320  LABPROT 18.5* 16.5*  INR 1.59* 1.37     Anti-infectives:   Anti-infectives   Start     Dose/Rate Route Frequency Ordered Stop   09/10/13 1316  vancomycin (VANCOCIN) 1,500 mg in sodium chloride 0.9 % 500 mL IVPB  Status:  Discontinued     1,500 mg 250 mL/hr over 120 Minutes Intravenous 90 min  pre-op 09/10/13 1316 09/10/13 1556   09/10/13 1315  vancomycin (VANCOCIN) IVPB 1000 mg/200 mL premix  Status:  Discontinued     1,000 mg 200 mL/hr over 60 Minutes Intravenous  Once 09/10/13 1311 09/10/13 1450   09/09/13 0500  levofloxacin (LEVAQUIN) tablet 250 mg  Status:  Discontinued     250 mg Oral Daily 09/09/13 0427 09/11/13 1441      Ovidio Kin, MD, FACS Pager: [phone] [facility] Office: [phone] 09/12/2013  Debridement summary: 1.  Tool used for debridement (curette, scapel, etc.)  Scissors  2.  Frequency of surgical debridement.   Once (today)  3.  Measurement of total devitalized tissue (wound surface) before and after surgical debridement.   [Photo in chart]  Before - 12 x 16 cm  After - 14 x 18 cm   4.  Area and depth of devitalized tissue removed from wound.  2 x 8 cm of skin with the accompanying 1 cm of subcutaneous fat  5.  Blood loss and description of tissue removed.  No blood loss.  Tissue removed was necrotic skin and  sq fat  6.  Progress note or procedure note with a detailed description of the procedure.  Surgeon:  D. Jaxsen Bernhart  Diagnosis:  Hematoma of right calf with necrosis of skin over hematoma  Procedure:  Debridement of right leg wound  Wound cleaned with betadine.  Scissors were used to debride 2 x 8 cm of dead skin and subcutaneous tissue.  The wound was then repacked with saline gauze, wrapped with curlex and Ace bandage.  The patient tolerated the procedure well  7.  Evidence of the progress of the wound's response to treatment.  A.  Current wound volume.  14 x 18 cm  B.  Presence of infection.  No.  C.  Presence of non viable tissue.  Minimal necrotic skin along edges that may need further debridement.  D.  Other material in the wound that is expected to inhibit healing.  None.

## 2013-09-13 ENCOUNTER — Ambulatory Visit: Payer: Self-pay | Admitting: Podiatry

## 2013-09-13 ENCOUNTER — Encounter (HOSPITAL_COMMUNITY): Payer: Self-pay | Admitting: General Surgery

## 2013-09-13 DIAGNOSIS — D638 Anemia in other chronic diseases classified elsewhere: Secondary | ICD-10-CM

## 2013-09-13 LAB — BASIC METABOLIC PANEL
BUN: 40 mg/dL — ABNORMAL HIGH (ref 6–23)
CO2: 23 mEq/L (ref 19–32)
Calcium: 8.3 mg/dL — ABNORMAL LOW (ref 8.4–10.5)
Chloride: 110 mEq/L (ref 96–112)
Creatinine, Ser: 2.62 mg/dL — ABNORMAL HIGH (ref 0.50–1.10)
GFR calc Af Amer: 21 mL/min — ABNORMAL LOW (ref >90)
Glucose, Bld: 65 mg/dL — ABNORMAL LOW (ref 70–99)
Potassium: 4.9 mEq/L (ref 3.5–5.1)

## 2013-09-13 LAB — TYPE AND SCREEN
Unit division: 0
Unit division: 0

## 2013-09-13 LAB — CBC
HCT: 25.7 % — ABNORMAL LOW (ref 36.0–46.0)
Hemoglobin: 8.2 g/dL — ABNORMAL LOW (ref 12.0–15.0)
MCH: 28.2 pg (ref 26.0–34.0)
MCV: 88.3 fL (ref 78.0–100.0)
Platelets: 206 10*3/uL (ref 150–400)
RDW: 18.1 % — ABNORMAL HIGH (ref 11.5–15.5)

## 2013-09-13 LAB — GLUCOSE, CAPILLARY
Glucose-Capillary: 105 mg/dL — ABNORMAL HIGH (ref 70–99)
Glucose-Capillary: 78 mg/dL (ref 70–99)

## 2013-09-13 MED ORDER — BOOST / RESOURCE BREEZE PO LIQD
1.0000 | Freq: Two times a day (BID) | ORAL | Status: DC
  Administered 2013-09-13 – 2013-09-22 (×15): 1 via ORAL

## 2013-09-13 NOTE — Progress Notes (Signed)
3 Days Post-Op  Subjective: Dressing changed and seen by Dr. Charlann Boxer earlier.  He will assume care of her right lower leg Hematoma site.  Objective: Vital signs in last 24 hours: Temp:  [97.6 F (36.4 C)-98.8 F (37.1 C)] 98.4 F (36.9 C) (10/06 0520) Pulse Rate:  [54-64] 62 (10/06 0520) Resp:  [18-20] 18 (10/06 0520) BP: (118-143)/(30-54) 118/30 mmHg (10/06 0520) SpO2:  [100 %] 100 % (10/06 0520) Weight:  [128.6 kg (283 lb 8.2 oz)] 128.6 kg (283 lb 8.2 oz) (10/06 0520) Last BM Date: 09/08/13 1080 PO recorded,  Carb modified diet Afebrile, VSS Labs stable Intake/Output from previous day: 10/05 0701 - 10/06 0700 In: 1943 [P.O.:1080; I.V.:863] Out: 3200 [Urine:3200] Intake/Output this shift: Total I/O In: 360 [P.O.:360] Out: -   General appearance: alert, cooperative, no distress and up in chair. Skin: Not examined, Seen by Dr. Charlann Boxer earlier  Lab Results:   Recent Labs  09/12/13 0500 09/13/13 0352  WBC 10.4 9.6  HGB 7.5* 8.2*  HCT 23.8* 25.7*  PLT 200 206    BMET  Recent Labs  09/12/13 0500 09/13/13 0352  NA 140 140  K 4.8 4.9  CL 110 110  CO2 22 23  GLUCOSE 85 65*  BUN 40* 40*  CREATININE 2.78* 2.62*  CALCIUM 7.5* 8.3*   PT/INR  Recent Labs  09/10/13 2320  LABPROT 16.5*  INR 1.37     Recent Labs Lab 09/09/13 1615  AST 11  ALT 9  ALKPHOS 85  BILITOT 0.4  PROT 5.7*  ALBUMIN 2.1*     Lipase     Component Value Date/Time   LIPASE 17 11/26/2011 0409     Studies/Results: No results found.  Medications: . allopurinol  100 mg Oral Daily  . amiodarone  200 mg Oral Daily  . amLODipine  10 mg Oral Daily  . atorvastatin  20 mg Oral QHS  . ferrous sulfate  325 mg Oral TID WC  . furosemide  40 mg Oral Daily  . insulin aspart  0-9 Units Subcutaneous TID WC  . insulin glargine  20 Units Subcutaneous BID  . metoprolol tartrate  25 mg Oral BID  . mupirocin ointment  1 application Nasal BID  . pantoprazole  40 mg Oral Daily  . polyethylene  glycol  17 g Oral BID  . senna-docusate  1 tablet Oral BID  . sodium chloride  10 mL Intravenous Q12H  . sodium chloride  3 mL Intravenous Q12H  . sodium chloride  3 mL Intravenous Q12H    Assessment/Plan 1. EVACUATION OF RIGHT LEG HEMATOMA AND EXCISION DEBRIDIMENT RIGHT LEG - 09/10/2013 - P. Carolynne Edouard  I debrided 2 x 8 cm of skin off the posterior aspect of the wound.  Otherwise the wound looks good.  Photo in chart under my PE.  2. Morbid obesity  3. Chronic LE venous stasis/lymph edema  4. Anemia, chronic and acute blood loss  5. Diabetes mellitus  Hypoglycemic this am.  6. Chronic anti-coagulation (on hold due to bleed) 7. Chronic kidney disease  Creatinine - 2.13 - 09/09/2013  8. Atrial fibrillation  9. On Levaquin - ? reason  10. DVT prophylaxis - on hold because of bleed  11. MRSA - on isolation   Plan.  Dr. Charlann Boxer will assume care.  We will sign off.   LOS: 5 days    [name] 09/13/2013

## 2013-09-13 NOTE — Progress Notes (Signed)
PT Cancellation Note  ___Treatment cancelled today due to medical issues with patient which prohibited therapy  ___ Treatment cancelled today due to patient receiving procedure or test   ___ Treatment cancelled today due to patient's refusal to participate   _X_ Treatment cancelled this am due to pt refusal despite several attempts esp since pt plans to D/C to home.   "I'm not getting up today"  Will return at 2pm for second attempt.  Felecia Shelling  PTA [facility]  Acute  Rehab Pager      [phone]

## 2013-09-13 NOTE — Progress Notes (Signed)
Hypoglycemic Event  CBG: 50  Treatment: 15 GM carbohydrate snack  Symptoms: None and none  Follow-up CBG: Time: 0810 CBG Result:105   Possible Reasons for Event: Medication regimen: lantus 20 units  Comments/MD notified: Dr. Izola Price at bedside;  Changed Lantus order for now    [name] Still  Remember to initiate Hypoglycemia Order Set & complete

## 2013-09-13 NOTE — Progress Notes (Signed)
General Surgery [facility]  Will sign off as per agreement with Dr. Charlann Boxer.  Velora Heckler, MD, [facility] Office: [phone]

## 2013-09-13 NOTE — Progress Notes (Signed)
INITIAL NUTRITION ASSESSMENT  DOCUMENTATION CODES Per approved criteria  -Morbid Obesity   INTERVENTION: Add Resource Breeze po BID, each supplement provides 250 kcal and 9 grams of protein.  RD to continue to follow nutrition care plan.  NUTRITION DIAGNOSIS: Inadequate oral intake r/t poor appetite AEB pt report.  Goal: PO intake to meet >/=90% estimated nutrition needs.  Monitor:  PO intake, supplement tolerance, diet advance, weight trends, labs   Reason for Assessment: MD Consult for Poor PO Intake  62 y.o. female  Admitting Dx: Spontaneous hematoma of lower leg  ASSESSMENT: PMHx significant for coumadin use for afib, CKD IV, DM (most recent A1c 5.9.) Admitted with spontaneous hematoma to R calf. Recent admission to [facility] for BRB[facility].   Underwent the following on 10/3: EVACUATION OF RIGHT LEG HEMATOMA AND EXCISION DEBRIDIMENT RIGHT LEG  Per most recent MD note, pt has received a total of 7 units of PRBC since admission. Question need for wound vac or skin graft at this time, s/p procedure.  Currently ordered for a Carbohydrate Modified Medium diet and eating minimally per nurse, nurse tech and patient. Discussed oral nutrition supplements and pt is agreeable to [employer], has had these in the past. RN expressed concern with patient's blood sugars, pt with recent episodes of hypoglycemia. Pt reports that she has been stressed with her hospitalizations which is also contributing to a poor appetite.  Height: Ht Readings from Last 1 Encounters:  09/09/13 5\' 3"  (1.6 m)    Weight: Wt Readings from Last 1 Encounters:  09/13/13 283 lb 8.2 oz (128.6 kg)    Ideal Body Weight: 115 lbs   % Ideal Body Weight: 246%  Wt Readings from Last 10 Encounters:  09/13/13 283 lb 8.2 oz (128.6 kg)  09/13/13 283 lb 8.2 oz (128.6 kg)  08/10/13 266 lb (120.657 kg)  08/10/13 266 lb (120.657 kg)  08/08/13 264 lb 12.4 oz (120.1 kg)  07/07/13 270 lb 8 oz (122.698 kg)  02/18/13 264 lb  5.3 oz (119.9 kg)  12/07/12 279 lb (126.554 kg)  01/04/13 270 lb (122.471 kg)  09/03/12 261 lb (118.389 kg)    Usual Body Weight: 270 lbs per weight hx  % Usual Body Weight: 105%  BMI:  Body mass index is 50.23 kg/(m^2). Obesity class 3, extreme  Estimated Nutritional Needs: Kcal: 2000-2200 Protein: 85-100 gm  Fluid: 2-2.2 L   Skin:  R leg incision Fissure on buttocks  Diet Order: Carb Control Medium  EDUCATION NEEDS: -No education needs identified at this time   Intake/Output Summary (Last 24 hours) at 09/13/13 1440 Last data filed at 09/13/13 0745  Gross per 24 hour  Intake   1813 ml  Output   3200 ml  Net  -1387 ml    Last BM: 10/1  Labs:   Recent Labs Lab 09/11/13 0900 09/12/13 0500 09/13/13 0352  NA 141 140 140  K 4.4 4.8 4.9  CL 112 110 110  CO2 22 22 23   BUN 39* 40* 40*  CREATININE 3.01* 2.78* 2.62*  CALCIUM 7.5* 7.5* 8.3*  GLUCOSE 118* 85 65*    CBG (last 3)   Recent Labs  09/13/13 0734 09/13/13 0822 09/13/13 1200  GLUCAP 50* 105* 88    Scheduled Meds: . allopurinol  100 mg Oral Daily  . amiodarone  200 mg Oral Daily  . amLODipine  10 mg Oral Daily  . atorvastatin  20 mg Oral QHS  . ferrous sulfate  325 mg Oral TID WC  .  furosemide  40 mg Oral Daily  . insulin aspart  0-9 Units Subcutaneous TID WC  . metoprolol tartrate  25 mg Oral BID  . mupirocin ointment  1 application Nasal BID  . pantoprazole  40 mg Oral Daily  . polyethylene glycol  17 g Oral BID  . senna-docusate  1 tablet Oral BID  . sodium chloride  10 mL Intravenous Q12H  . sodium chloride  3 mL Intravenous Q12H  . sodium chloride  3 mL Intravenous Q12H    Continuous Infusions:  none  Past Medical History  Diagnosis Date  . Asthma   . Bronchitis   . Allergic rhinitis   . HTN (hypertension)   . Hyperlipidemia   . Obesity   . OSA (obstructive sleep apnea)     poor compliance with cpap  . CHF (congestive heart failure)   . Biventricular failure      compensated  . Psoriasis   . Stasis edema     of lower extremities  . Depression   . Situational stress   . Kidney stones   . Kidney disease, chronic, stage III (GFR 30-59 ml/min)   . Anemia   . Gout   . Chronic anticoagulation   . Yeast infection involving the vagina and surrounding area   . Atrial fibrillation with RVR   . GERD (gastroesophageal reflux disease)   . Chronic bronchitis     "get it q yr" (08/10/2013)  . Pneumonia     "said I did on 08/03/2013 then they ruled it out" (08/10/2013)  . Shortness of breath     "all the time" (08/10/2013)  . On home oxygen therapy     "2L during the night and prn during the day" (08/10/2013)  . Type II diabetes mellitus   . History of blood transfusion     "few during my lifetime; last time was 4 days ago when I got 2 units" (08/10/2013)  . Stroke ?2007    denies residuals on 08/10/2013  . DJD (degenerative joint disease)   . Bleeding     "from my skin folds; just won't stop" (08/10/2013)  . Complication of anesthesia     "they gave me too much; I stayed out of it for awhile" (08/10/2013)  . Anxiety     Past Surgical History  Procedure Laterality Date  . Appendectomy    . Cholecystectomy    . Total knee arthroplasty Left ~ 2006  . Total abdominal hysterectomy    . Back surgery    . Cystectomy Left     hand  . Tonsillectomy    . Joint replacement    . Lumbar disc surgery    . Colonoscopy N/A 08/16/2013    Procedure: COLONOSCOPY;  Surgeon: Theda Belfast, MD;  Location: [facility] ENDOSCOPY;  Service: Endoscopy;  Laterality: N/A;  . Hematoma evacuation Right 09/10/2013    Procedure: EVACUATION OF RIGHT LEG HEMATOMA AND EXCISION DEBRIDIMENT RIGHT LEG ;  Surgeon: Robyne Askew, MD;  Location: [facility] ORS;  Service: General;  Laterality: Right;    Jarold Motto MS, RD, LDN Pager: [phone] After-hours pager: [phone]

## 2013-09-13 NOTE — Progress Notes (Signed)
Patient ID: [name], female   DOB: [birth year], 62 y.o.   MRN: [id number]  Ms. [name] comfortable in chair this am  Wound: 8-10 posterior lower leg skin defect following evacuation of hematoma and subsequent skin necrosis, taken care of by general surgery initially due to timing medical conditions at time of admission  Redressed wound today with a saline wet to dry dressing  Consulted Dr. Alan Ripper Sanger to ask for her expertise in wound management given the size of the wound.  Would imagine that it will either be treated with a wound vac versus repeat surgical intervention with possible skin graft  Will follow until she has created a plan  Call with any questions in interim

## 2013-09-13 NOTE — Progress Notes (Signed)
PT Cancellation Note  ___Treatment cancelled today due to medical issues with patient which prohibited therapy  ___ Treatment cancelled today due to patient receiving procedure or test   ___ Treatment cancelled today due to patient's refusal to participate   _X_ Treatment cancelled today due to pt's request.  Returned at 2 pm as promised but pt stated she had to many "personal issues to do anything today".  Felecia Shelling  PTA [facility]  Acute  Rehab Pager      [phone]

## 2013-09-13 NOTE — Progress Notes (Addendum)
[facility] PROGRESS NOTE  [name] ZOX:[id number] DOB: October 27, 1951 DOA: 09/08/2013 PCP: Willey Blade, MD  Brief narrative: 62 yo female on coumadin for afib presented to [facility] ED with main concern of progressively enlarging right lower extremity, calf area. She reports history of hematoma in right calf but it sappers to have now gotten much bigger and more painful. Pt denies trauma to the leg, no chest pain or shortness of breath, no abdominal or urinary concerns, no specific focal neurological symptoms. In ED, INR > 4, received one dose of Vitamin k and [facility] asked to admit for further evaluation and management. Patient is status post excision and debridement of right leg hematoma, no subsequent complications.  Assessment and Plan:  Principal Problem:  Acute blood loss anemia in the setting of spontaneous hematoma of lower leg  - status post evacuation, excision and debridement of right leg hematoma post op day #3, doing well and clinically stable  - further drop in Hg 7.2 --> 6.2 --> 5.7 --> 6.7 --> 7.0 --> 8.4 --> 7.5 --> 8.2 - pt has received total of 7 units of PRBC so far since admission  - hemoglobin stable this am 8.2 - ? Need for wound vac, skin graft, appreciate ortho input Active Problems:  HYPERTENSION  - BP 118/30 - Continue Metoprolol 25 mg PO BID, Norvasc 10 mg daily DM (diabetes mellitus)  - last A1C 09/10 was 5.9  - will continue Lantus pbut will lower the dose until pt improves oral intake  - CBGs in past 24 hours: 50, 105, 88 Chronic anticoagulation  - Coumadin on hold, monitor on telemetry  CKD (chronic kidney disease), stage IV  - Cr on admission 2.33 -> 2.13 --> 3.1 --> 2.78 --> 2.62 this AM - I have temporarily stopped Lasix and will be able to resume today as weigh is trending up 275 lbs (10/04) --> 282 lbs --> 283 lbs - no other nephrotoxic agents noted  - monitor closely  Grade II diastolic and systolic dysfunction  - last 2 D ECHO in 02/2013, pt is  on Lasix 40 mg PO QD  - Cr trending down but weight up since yesterday, resume Lasix 40 mg PO QD  - strict I's and O's, daily weight  PAF (paroxysmal atrial fibrillation)  - rate controlled  - continue Metoprolol and amiodarone Morbid obesity  - nutrition consultation obtained  Code Status: Full  Family Communication: Pt at bedside  Disposition Plan: Home when medically stable    Consultants:  Surgery  Orthopedic surgery  Procedures/Studies:  Ct Tibia Fibula Right Wo Contrast 09/09/2013 Large subcutaneous posterior medial calf hematoma.  Dg Chest Port 1 View 09/10/2013 Status post right central line placement without pneumothorax. Crowding of the vascular markings due to a poor inspiratory effort.  Antibiotics:   Levaquin started on admission, no clear reason --> will discontinue    HPI/Subjective: No events overnight.   Objective: Filed Vitals:   09/12/13 1228 09/12/13 1300 09/12/13 2135 09/13/13 0520  BP: 124/38 132/41 138/54 118/30  Pulse: 58 54 64 62  Temp: 97.8 F (36.6 C) 98.7 F (37.1 C) 98.8 F (37.1 C) 98.4 F (36.9 C)  TempSrc: Oral Oral Oral Oral  Resp: 18 18 18 18   Height:      Weight:    128.6 kg (283 lb 8.2 oz)  SpO2: 100% 100% 100% 100%    Intake/Output Summary (Last 24 hours) at 09/13/13 1335 Last data filed at 09/13/13 0745  Gross per 24 hour  Intake   1813 ml  Output   3200 ml  Net  -1387 ml    Exam:   General:  Pt is alert, not in acute distress  Cardiovascular: Regular rate and rhythm, S1/S2 appreciate it  Respiratory: Clear to auscultation bilaterally, no wheezing, no crackles, no rhonchi  Abdomen: Soft, non tender, non distended, bowel sounds present, no guarding  Neuro: Grossly nonfocal  Data Reviewed: Basic Metabolic Panel:  Recent Labs Lab 09/09/13 0510 09/09/13 1615 09/11/13 0900 09/12/13 0500 09/13/13 0352  NA 142 144 141 140 140  K 4.5 5.0 4.4 4.8 4.9  CL 111 113* 112 110 110  CO2 24 24 22 22 23   GLUCOSE 160* 92  118* 85 65*  BUN 32* 33* 39* 40* 40*  CREATININE 2.00* 2.13* 3.01* 2.78* 2.62*  CALCIUM 8.2* 8.0* 7.5* 7.5* 8.3*   Liver Function Tests:  Recent Labs Lab 09/09/13 1615  AST 11  ALT 9  ALKPHOS 85  BILITOT 0.4  PROT 5.7*  ALBUMIN 2.1*   CBC:  Recent Labs Lab 09/08/13 2355  09/09/13 0510  09/10/13 1721 09/10/13 2320 09/11/13 0900 09/12/13 0500 09/13/13 0352  WBC 10.4  --  10.3  --   --   --  10.8* 10.4 9.6  NEUTROABS 8.8*  --   --   --   --   --   --   --   --   HGB 7.2*  < > 6.2*  < > 8.9* 7.0* 8.4* 7.5* 8.2*  HCT 23.5*  < > 20.7*  < > 26.4* 20.7* 25.4* 23.8* 25.7*  MCV 78.1  --  78.1  --   --   --  85.2 86.9 88.3  PLT 303  --  237  --   --   --  193 200 206  < > = values in this interval not displayed.  CBG:  Recent Labs Lab 09/12/13 1657 09/12/13 2112 09/13/13 0734 09/13/13 0822 09/13/13 1200  GLUCAP 83 109* 50* 105* 88    MRSA PCR SCREENING     Status: Abnormal   Collection Time    09/09/13  4:29 AM      Result Value Range Status   MRSA by PCR POSITIVE (*) NEGATIVE Final     Scheduled Meds: . allopurinol  100 mg Oral Daily  . amiodarone  200 mg Oral Daily  . amLODipine  10 mg Oral Daily  . atorvastatin  20 mg Oral QHS  . ferrous sulfate  325 mg Oral TID WC  . furosemide  40 mg Oral Daily  . insulin aspart  0-9 Units Subcutaneous TID WC  . insulin glargine  20 Units Subcutaneous BID  . metoprolol tartrate  25 mg Oral BID  . pantoprazole  40 mg Oral Daily  . polyethylene glycol  17 g Oral BID  . senna-docusate  1 tablet Oral BID    Debbora Presto, MD  [facility] Pager [phone]  If 7PM-7AM, please contact night-coverage [email or web address] Password [email or web address] 09/13/2013, 1:35 PM   LOS: 5 days

## 2013-09-13 NOTE — Consult Note (Signed)
Reason for Consult:Large wound right posterior calf Referring Physician: Dr. Otilio Carpen is an 62 y.o. female.  HPI: 62 y/o morbidly obese female with history of chronic systolic and diastolic CHF, diabetes mellitus type 2, hypertension, hyperlipidemia, paroxysmal A. 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  She has had recurrent bleeding episodes from SQ tissue and then presented 09/09/2013 with a right calf large posterior bleeding hematoma with elevated INR and progressive blood loss anemia. The INR was corrected and she received tranfusion of multiple units of pRBC's and has stabilized hemodynamically.   She underwent debridement of the hematoma in the OR by General Surgery, Dr. Carolynne Edouard and has a large approximately 18 x 14 x 4.5 cm wound over the posterior right calf following the debridement. We are asked to evaluate the wound to assist with wound management.   Past Medical History  Diagnosis Date  . Asthma   . Bronchitis   . Allergic rhinitis   . HTN (hypertension)   . Hyperlipidemia   . Obesity   . OSA (obstructive sleep apnea)     poor compliance with cpap  . CHF (congestive heart failure)   . Biventricular failure     compensated  . Psoriasis   . Stasis edema     of lower extremities  . Depression   . Situational stress   . Kidney stones   . Kidney disease, chronic, stage III (GFR 30-59 ml/min)   . Anemia   . Gout   . Chronic anticoagulation   . Yeast infection involving the vagina and surrounding area   . Atrial fibrillation with RVR   . GERD (gastroesophageal reflux disease)   . Chronic bronchitis     "get it q yr" (08/10/2013)  . Pneumonia     "said  I did on 08/03/2013 then they ruled it out" (08/10/2013)  . Shortness of breath     "all the time" (08/10/2013)  . On home oxygen therapy     "2L during the night and prn during the day" (08/10/2013)  . Type II diabetes mellitus   . History of blood transfusion     "few during my lifetime; last time was 4 days ago when I got 2 units" (08/10/2013)  . Stroke ?2007    denies residuals on 08/10/2013  . DJD (degenerative joint disease)   . Bleeding     "from my skin folds; just won't stop" (08/10/2013)  . Complication of anesthesia     "they gave me too much; I stayed out of it for awhile" (08/10/2013)  . Anxiety     Past Surgical History  Procedure Laterality Date  . Appendectomy    . Cholecystectomy    . Total knee arthroplasty Left ~ 2006  . Total abdominal hysterectomy    . Back surgery    . Cystectomy Left     hand  . Tonsillectomy    . Joint replacement    . Lumbar disc surgery    . Colonoscopy N/A 08/16/2013    Procedure: COLONOSCOPY;  Surgeon: Theda Belfast, MD;  Location: [facility];  Service: Endoscopy;  Laterality: N/A;    Family History  Problem Relation Age of Onset  . Heart attack Father   .  Asthma Father   . Heart disease Paternal Uncle   . Rectal cancer Paternal Aunt   . Other Mother     mva    Social History:  reports that she quit smoking about 31 years ago. Her smoking use included Cigarettes. She has a 1.44 pack-year smoking history. She has never used smokeless tobacco. She reports that she does not drink alcohol or use illicit drugs.  Allergies:  Allergies  Allergen Reactions  . Daptomycin Other (See Comments)    Elevated CPK  . Morphine And Related Nausea And Vomiting  . Cephalexin Rash    unknown  . Celecoxib     unknown  . Codeine     unknown  . Fluoxetine Hcl     unknown  . Latex     REACTION: undefined  . Ofloxacin     unknown  . Penicillins     unknown  . Rofecoxib     unknown  . Sulfonamide Derivatives     unknown  . Tramadol Nausea And  Vomiting    Medications: I have reviewed the patient's current medications.  Results for orders placed during the hospital encounter of 09/08/13 (from the past 48 hour(s))  GLUCOSE, CAPILLARY     Status: Abnormal   Collection Time    09/11/13 11:48 AM      Result Value Range   Glucose-Capillary 100 (*) 70 - 99 mg/dL  GLUCOSE, CAPILLARY     Status: Abnormal   Collection Time    09/11/13  4:35 PM      Result Value Range   Glucose-Capillary 118 (*) 70 - 99 mg/dL  GLUCOSE, CAPILLARY     Status: Abnormal   Collection Time    09/11/13  4:38 PM      Result Value Range   Glucose-Capillary 107 (*) 70 - 99 mg/dL  GLUCOSE, CAPILLARY     Status: Abnormal   Collection Time    09/11/13  9:06 PM      Result Value Range   Glucose-Capillary 150 (*) 70 - 99 mg/dL  CBC     Status: Abnormal   Collection Time    09/12/13  5:00 AM      Result Value Range   WBC 10.4  4.0 - 10.5 K/uL   RBC 2.74 (*) 3.87 - 5.11 MIL/uL   Hemoglobin 7.5 (*) 12.0 - 15.0 g/dL   HCT 16.1 (*) 09.6 - 04.5 %   MCV 86.9  78.0 - 100.0 fL   MCH 27.4  26.0 - 34.0 pg   MCHC 31.5  30.0 - 36.0 g/dL   RDW 40.9 (*) 81.1 - 91.4 %   Platelets 200  150 - 400 K/uL  BASIC METABOLIC PANEL     Status: Abnormal   Collection Time    09/12/13  5:00 AM      Result Value Range   Sodium 140  135 - 145 mEq/L   Potassium 4.8  3.5 - 5.1 mEq/L   Chloride 110  96 - 112 mEq/L   CO2 22  19 - 32 mEq/L   Glucose, Bld 85  70 - 99 mg/dL   BUN 40 (*) 6 - 23 mg/dL   Creatinine, Ser 7.82 (*) 0.50 - 1.10 mg/dL   Calcium 7.5 (*) 8.4 - 10.5 mg/dL   GFR calc non Af Amer 17 (*) >90 mL/min   GFR calc Af Amer 20 (*) >90 mL/min   Comment: (NOTE)     The eGFR has been calculated using the  CKD EPI equation.     This calculation has not been validated in all clinical situations.     eGFR's persistently <90 mL/min signify possible Chronic Kidney     Disease.  GLUCOSE, CAPILLARY     Status: Abnormal   Collection Time    09/12/13  7:36 AM      Result  Value Range   Glucose-Capillary 67 (*) 70 - 99 mg/dL  PREPARE RBC (CROSSMATCH)     Status: None   Collection Time    09/12/13  8:30 AM      Result Value Range   Order Confirmation ORDER PROCESSED BY BLOOD BANK    GLUCOSE, CAPILLARY     Status: None   Collection Time    09/12/13  8:49 AM      Result Value Range   Glucose-Capillary 89  70 - 99 mg/dL  GLUCOSE, CAPILLARY     Status: Abnormal   Collection Time    09/12/13 11:27 AM      Result Value Range   Glucose-Capillary 125 (*) 70 - 99 mg/dL  GLUCOSE, CAPILLARY     Status: None   Collection Time    09/12/13  4:57 PM      Result Value Range   Glucose-Capillary 83  70 - 99 mg/dL  GLUCOSE, CAPILLARY     Status: Abnormal   Collection Time    09/12/13  9:12 PM      Result Value Range   Glucose-Capillary 109 (*) 70 - 99 mg/dL   Comment 1 Notify RN    CBC     Status: Abnormal   Collection Time    09/13/13  3:52 AM      Result Value Range   WBC 9.6  4.0 - 10.5 K/uL   RBC 2.91 (*) 3.87 - 5.11 MIL/uL   Hemoglobin 8.2 (*) 12.0 - 15.0 g/dL   HCT 16.1 (*) 09.6 - 04.5 %   MCV 88.3  78.0 - 100.0 fL   MCH 28.2  26.0 - 34.0 pg   MCHC 31.9  30.0 - 36.0 g/dL   RDW 40.9 (*) 81.1 - 91.4 %   Platelets 206  150 - 400 K/uL  BASIC METABOLIC PANEL     Status: Abnormal   Collection Time    09/13/13  3:52 AM      Result Value Range   Sodium 140  135 - 145 mEq/L   Potassium 4.9  3.5 - 5.1 mEq/L   Chloride 110  96 - 112 mEq/L   CO2 23  19 - 32 mEq/L   Glucose, Bld 65 (*) 70 - 99 mg/dL   BUN 40 (*) 6 - 23 mg/dL   Creatinine, Ser 7.82 (*) 0.50 - 1.10 mg/dL   Calcium 8.3 (*) 8.4 - 10.5 mg/dL   GFR calc non Af Amer 18 (*) >90 mL/min   GFR calc Af Amer 21 (*) >90 mL/min   Comment: (NOTE)     The eGFR has been calculated using the CKD EPI equation.     This calculation has not been validated in all clinical situations.     eGFR's persistently <90 mL/min signify possible Chronic Kidney     Disease.  GLUCOSE, CAPILLARY     Status: Abnormal    Collection Time    09/13/13  7:34 AM      Result Value Range   Glucose-Capillary 50 (*) 70 - 99 mg/dL  GLUCOSE, CAPILLARY     Status: Abnormal   Collection Time  09/13/13  8:22 AM      Result Value Range   Glucose-Capillary 105 (*) 70 - 99 mg/dL    No results found.  Review of Systems  Unable to perform ROS: medical condition   Blood pressure 118/30, pulse 62, temperature 98.4 F (36.9 C), temperature source Oral, resp. rate 18, height 5\' 3"  (1.6 m), weight 128.6 kg (283 lb 8.2 oz), SpO2 100.00%. Physical Exam  Constitutional:  Morbidly obese female sitting up in chair. Patient becomes short of breath with limited conversation.   HENT:  Head: Normocephalic and atraumatic.  Nose: Nose normal.  Mouth/Throat: Oropharynx is clear and moist.  Eyes: EOM are normal. Pupils are equal, round, and reactive to light.  Neck: Normal range of motion. Neck supple. No tracheal deviation present. No thyromegaly present.  Cardiovascular: Normal rate.   Slow irregular rhythm  Respiratory: No stridor.  On nasal cannula oxygen. Patient has difficulty speaking and becomes short of breath with conversation  GI: Soft. Bowel sounds are normal. She exhibits no mass. There is no tenderness.  Morbidly obese  Musculoskeletal: She exhibits edema (bilateral lower extremity edema and chronic stasis changes) and tenderness (tenderness of right lower extremity to palpation).  Skin: Skin is warm.  Chronic stasis changes of lower extremities.  The right calf wound measures approximately 18 x 14 x 4.5 cm with some necrosis of borders. The wound bed appears healthy and without signs of infection    Assessment/Plan: Status post debridement of large right posterior calf hematoma with large residual wound- Patient would benefit from placement of Acell and negative pressure wound therapy. She remains off Coumadin for now and will plan to do this at the bedside given her multiple medical issues on Wednesday 09/15/13  at 10 am. She will need negative pressure wound therapy at discharge, either at home or in a facility. She will be followed up in [facility] on Monday mornings by Dr. Kelly Splinter.   Franki Monte 09/13/2013, 9:46 AM  Plastic Surgery [phone]

## 2013-09-13 NOTE — Progress Notes (Signed)
Chart review complete.  Patient is not eligible for [facility] Care Management services because his/her PCP is not a [facility] primary care provider or is not [facility] affiliated.  For any additional questions or new referrals please contact Tim Henderson BSN RN [facility] Liaison at [phone] °

## 2013-09-14 DIAGNOSIS — I5031 Acute diastolic (congestive) heart failure: Secondary | ICD-10-CM

## 2013-09-14 DIAGNOSIS — I509 Heart failure, unspecified: Secondary | ICD-10-CM

## 2013-09-14 LAB — CBC
Hemoglobin: 7.9 g/dL — ABNORMAL LOW (ref 12.0–15.0)
MCH: 28.5 pg (ref 26.0–34.0)
MCHC: 32 g/dL (ref 30.0–36.0)
MCV: 89.2 fL (ref 78.0–100.0)
Platelets: 225 10*3/uL (ref 150–400)
RBC: 2.77 MIL/uL — ABNORMAL LOW (ref 3.87–5.11)
RDW: 17.7 % — ABNORMAL HIGH (ref 11.5–15.5)

## 2013-09-14 LAB — GLUCOSE, CAPILLARY
Glucose-Capillary: 81 mg/dL (ref 70–99)
Glucose-Capillary: 120 mg/dL — ABNORMAL HIGH (ref 70–99)
Glucose-Capillary: 138 mg/dL — ABNORMAL HIGH (ref 70–99)

## 2013-09-14 LAB — BASIC METABOLIC PANEL
CO2: 25 mEq/L (ref 19–32)
Calcium: 8.4 mg/dL (ref 8.4–10.5)
Glucose, Bld: 77 mg/dL (ref 70–99)
Sodium: 140 mEq/L (ref 135–145)

## 2013-09-14 MED ORDER — SODIUM CHLORIDE 0.9 % IJ SOLN
10.0000 mL | INTRAMUSCULAR | Status: DC | PRN
  Administered 2013-09-14 – 2013-09-18 (×6): 10 mL
  Administered 2013-09-19: 20 mL
  Administered 2013-09-20: 10 mL
  Administered 2013-09-20: 20 mL
  Administered 2013-09-21 (×2): 10 mL

## 2013-09-14 MED ORDER — DIPHENHYDRAMINE HCL 25 MG PO CAPS
25.0000 mg | ORAL_CAPSULE | Freq: Four times a day (QID) | ORAL | Status: DC | PRN
  Administered 2013-09-14 – 2013-09-16 (×5): 25 mg via ORAL
  Filled 2013-09-14 (×5): qty 1

## 2013-09-14 NOTE — Progress Notes (Signed)
Physical Therapy Treatment Patient Details Name: [name] MRN: [id number] DOB: September 06, 1951 Today's Date: 09/14/2013 Time: 4709-6283 PT Time Calculation (min): 25 min  PT Assessment / Plan / Recommendation  History of Present Illness Pt with possible VAC placement tomorrow.  Pt needs encouragemen to participate with PT. Pt states "I may need to go to rehabe for a short time--- not to stay!"   PT Comments   Pt limited by pain and requires much encouragement to participate with PT.  She is deconditioned and will need continued PT to regain functional independence to be able to d/c safely to home  Follow Up Recommendations  SNF     Does the patient have the potential to tolerate intense rehabilitation     Barriers to Discharge        Equipment Recommendations  None recommended by PT    Recommendations for Other Services OT consult  Frequency Min 3X/week   Progress towards PT Goals Progress towards PT goals: Progressing toward goals  Plan Discharge plan needs to be updated    Precautions / Restrictions Precautions Precautions: Fall Precaution Comments: decreased activity tolerance. O2 PRN Restrictions Weight Bearing Restrictions: No   Pertinent Vitals/Pain Pt c/o pain in legs    Mobility  Bed Mobility Bed Mobility: Supine to Sit Left Sidelying to Sit: 1: +2 Total assist;HOB elevated;With rails Left Sidelying to Sit: Patient Percentage: 70% Details for Bed Mobility Assistance: heavy reliance on rails. Assist for trunk and bil LEs. Increased time and effort.  Transfers Transfers: Sit to Stand;Stand to Sit Sit to Stand: 1: +2 Total assist;From bed Sit to Stand: Patient Percentage: 80% Stand to Sit: 1: +2 Total assist;To chair/3-in-1;With armrests Stand to Sit: Patient Percentage: 80% Details for Transfer Assistance: Repeated sit to stand x 2 with emphasis on standing tolerance with good posture and core activation.  Ambulation/Gait Ambulation/Gait Assistance: Not  tested (comment);4: Min assist Ambulation Distance (Feet): 2 Feet (side step to top of bed) Assistive device: Rolling walker Ambulation/Gait Assistance Details: Pt with lmited activity tolerance and needed encouragement to  step to top of bed.   Gait Pattern: Step-to pattern;Decreased step length - right;Decreased step length - left Gait velocity: decreased General Gait Details: Pt was able to weight shift and step, but she limits the amount she will do Stairs: No Wheelchair Mobility Wheelchair Mobility: No    Exercises General Exercises - Lower Extremity Ankle Circles/Pumps: AROM;Both;10 reps;Supine (edema noted in right foot) Quad Sets: AROM;Both;10 reps Gluteal Sets: AROM;Both;10 reps;Standing Long Arc Quad: AROM;Right;15 reps;Seated Hip ABduction/ADduction: AAROM;Both;10 reps;Supine Hip Flexion/Marching: AROM;Both;10 reps;Seated Other Exercises Other Exercises: abd sets   PT Diagnosis:    PT Problem List:   PT Treatment Interventions:     PT Goals (current goals can now be found in the care plan section)    Visit Information  Last PT Received On: 09/14/13 Assistance Needed: +2 History of Present Illness: Pt with possible VAC placement tomorrow.  Pt needs encouragemen to participate with PT. Pt states "I may need to go to rehabe for a short time--- not to stay!"    Subjective Data      Cognition  Cognition Arousal/Alertness: Awake/alert Behavior During Therapy: WFL for tasks assessed/performed Overall Cognitive Status: Within Functional Limits for tasks assessed Area of Impairment: Safety/judgement;Awareness Safety/Judgement: Decreased awareness of safety;Decreased awareness of deficits General Comments: pt needs much encouragement to participate with PT.  She limits activities and would not agree to walk a Dentist  Static Sitting - Balance Support: Bilateral upper extremity supported;Feet supported Static Sitting - Level of  Assistance: 5: Stand by assistance ( difficulty finding balance point due to short stature) Static Standing Balance Static Standing - Balance Support: Bilateral upper extremity supported;During functional activity Static Standing - Level of Assistance: 5: Stand by assistance  End of Session PT - End of Session Activity Tolerance: Patient limited by pain;Patient limited by fatigue Patient left: in bed;with call bell/phone within reach   GP    [facility] K. Gordon, [place] [phone] 09/14/2013, 3:18 PM

## 2013-09-14 NOTE — Progress Notes (Signed)
Patient ID: [name], female   DOB: November 14, 1951, 62 y.o.   MRN: [id number]  [facility] PROGRESS NOTE  [name]:[id number] DOB: [birth year] DOA: 09/08/2013 PCP: Willey Blade, MD  Brief narrative:  62 yo female on coumadin for afib presented to [facility] ED with main concern of progressively enlarging right lower extremity, calf area. She reports history of hematoma in right calf but it sappers to have now gotten much bigger and more painful. Pt denies trauma to the leg, no chest pain or shortness of breath, no abdominal or urinary concerns, no specific focal neurological symptoms. In ED, INR > 4, received one dose of Vitamin k and [facility] asked to admit for further evaluation and management. Patient is status post excision and debridement of right leg hematoma, no subsequent complications.   Assessment and Plan:  Principal Problem:  Acute blood loss anemia in the setting of spontaneous hematoma of lower leg  - status post evacuation, excision and debridement of right leg hematoma post op day #4, doing well and clinically stable  - have not examined the wound today, right LE wrapped, awaiting for ortho team to re assess  - further drop in Hg 7.2 --> 6.2 --> 5.7 --> 6.7 --> 7.0 --> 8.4 --> 7.5 --> 8.2 --> 7.9 - pt has received total of 7 units of PRBC so far since admission  - hemoglobin overall stable buts slightly down from yesterday - will repeat CBC in AM and if Hg continues to drop will plan on transfusion of PRBC  - ? Need for wound vac, skin graft, appreciate ortho input  Active Problems:  HYPERTENSION  - reasonable inpatient control  - Continue Metoprolol 25 mg PO BID, Norvasc 10 mg daily  DM (diabetes mellitus)  - last A1C 09/10 was 5.9  - off Lantus until oral intake improves - continue SSI for now  Chronic anticoagulation  - Coumadin on hold, monitor on telemetry  CKD (chronic kidney disease), stage IV  - Cr on admission 2.33 -> 2.13 --> 3.1 --> 2.78 --> 2.62 -->  2.4 - Lasix initially held but resumed 10/06 as weight was trending up 275 lbs (10/04) --> 282 lbs --> 283 lbs --> 276 this AM - Cr continue to trend down, continue Lasix  - no other nephrotoxic agents noted  - monitor closely  Grade II diastolic and systolic dysfunction  - last 2 D ECHO in 02/2013, pt is on Lasix 40 mg PO QD  - Cr trending down, weight trend noted above  - strict I's and O's, daily weights  PAF (paroxysmal atrial fibrillation)  - rate controlled  - continue Metoprolol and amiodarone  Morbid obesity  - nutrition consultation obtained   Code Status: Full  Family Communication: Pt at bedside  Disposition Plan: Home when medically stable   Consultants:  Surgery  Orthopedic surgery  Procedures/Studies:  Ct Tibia Fibula Right Wo Contrast 09/09/2013 Large subcutaneous posterior medial calf hematoma.  Dg Chest Port 1 View 09/10/2013 Status post right central line placement without pneumothorax. Crowding of the vascular markings due to a poor inspiratory effort.  Antibiotics:   Levaquin started on admission, no clear reason --> will discontinue    HPI/Subjective: No events overnight.   Objective: Filed Vitals:   09/13/13 2303 09/14/13 0611 09/14/13 0647 09/14/13 1508  BP:  142/38  152/41  Pulse: 65 54  55  Temp:  98.2 F (36.8 C)  97.8 F (36.6 C)  TempSrc:  Oral  Oral  Resp:  16  20  Height:      Weight:   125.2 kg (276 lb 0.3 oz)   SpO2:  100%  99%    Intake/Output Summary (Last 24 hours) at 09/14/13 1739 Last data filed at 09/14/13 1500  Gross per 24 hour  Intake    750 ml  Output   1525 ml  Net   -775 ml    Exam:   General:  Pt is alert, follows commands appropriately, not in acute distress  Cardiovascular: Regular rate and rhythm, S1/S2, no murmurs, no rubs, no gallops  Respiratory: Clear to auscultation bilaterally, no wheezing, no crackles, no rhonchi  Abdomen: Soft, non tender, non distended, bowel sounds present, no guarding  Neuro:  Grossly nonfocal  Data Reviewed: Basic Metabolic Panel:  Recent Labs Lab 09/09/13 1615 09/11/13 0900 09/12/13 0500 09/13/13 0352 09/14/13 0510  NA 144 141 140 140 140  K 5.0 4.4 4.8 4.9 4.6  CL 113* 112 110 110 108  CO2 24 22 22 23 25   GLUCOSE 92 118* 85 65* 77  BUN 33* 39* 40* 40* 38*  CREATININE 2.13* 3.01* 2.78* 2.62* 2.46*  CALCIUM 8.0* 7.5* 7.5* 8.3* 8.4   Liver Function Tests:  Recent Labs Lab 09/09/13 1615  AST 11  ALT 9  ALKPHOS 85  BILITOT 0.4  PROT 5.7*  ALBUMIN 2.1*   CBC:  Recent Labs Lab 09/08/13 2355  09/09/13 0510  09/10/13 2320 09/11/13 0900 09/12/13 0500 09/13/13 0352 09/14/13 0510  WBC 10.4  --  10.3  --   --  10.8* 10.4 9.6 8.4  NEUTROABS 8.8*  --   --   --   --   --   --   --   --   HGB 7.2*  < > 6.2*  < > 7.0* 8.4* 7.5* 8.2* 7.9*  HCT 23.5*  < > 20.7*  < > 20.7* 25.4* 23.8* 25.7* 24.7*  MCV 78.1  --  78.1  --   --  85.2 86.9 88.3 89.2  PLT 303  --  237  --   --  193 200 206 225  < > = values in this interval not displayed.  CBG:  Recent Labs Lab 09/13/13 1643 09/13/13 2254 09/14/13 0810 09/14/13 1158 09/14/13 1707  GLUCAP 79 78 81 120* 138*    Recent Results (from the past 240 hour(s))  MRSA PCR SCREENING     Status: Abnormal   Collection Time    09/09/13  4:29 AM      Result Value Range Status   MRSA by PCR POSITIVE (*) NEGATIVE Final   Comment:            The GeneXpert MRSA Assay (FDA     approved for NASAL specimens     only), is one component of a     comprehensive MRSA colonization     surveillance program. It is not     intended to diagnose MRSA     infection nor to guide or     monitor treatment for     MRSA infections.     RESULT CALLED TO, READ BACK BY AND VERIFIED WITH:     Dorris Fetch 09/09/13 0825 BY K SCHULTZ     Performed at [facility]     Scheduled Meds: . allopurinol  100 mg Oral Daily  . amiodarone  200 mg Oral Daily  . amLODipine  10 mg Oral Daily  . atorvastatin  20 mg Oral QHS  .  feeding supplement (RESOURCE BREEZE)  1 Container Oral BID BM  . ferrous sulfate  325 mg Oral TID WC  . furosemide  40 mg Oral Daily  . insulin aspart  0-9 Units Subcutaneous TID WC  . metoprolol tartrate  25 mg Oral BID  . pantoprazole  40 mg Oral Daily  . polyethylene glycol  17 g Oral BID  . senna-docusate  1 tablet Oral BID  . sodium chloride  10 mL Intravenous Q12H  . sodium chloride  3 mL Intravenous Q12H  . sodium chloride  3 mL Intravenous Q12H   Continuous Infusions:   Debbora Presto, MD  [facility] Pager [phone]  If 7PM-7AM, please contact night-coverage [email or web address] Password [email or web address] 09/14/2013, 5:39 PM   LOS: 6 days

## 2013-09-15 ENCOUNTER — Inpatient Hospital Stay (HOSPITAL_COMMUNITY): Payer: Medicare HMO

## 2013-09-15 DIAGNOSIS — R0602 Shortness of breath: Secondary | ICD-10-CM | POA: Clinically undetermined

## 2013-09-15 DIAGNOSIS — D649 Anemia, unspecified: Secondary | ICD-10-CM

## 2013-09-15 LAB — CBC
HCT: 24.6 % — ABNORMAL LOW (ref 36.0–46.0)
Hemoglobin: 7.8 g/dL — ABNORMAL LOW (ref 12.0–15.0)
MCH: 28.5 pg (ref 26.0–34.0)
MCHC: 31.7 g/dL (ref 30.0–36.0)
RDW: 17.6 % — ABNORMAL HIGH (ref 11.5–15.5)

## 2013-09-15 LAB — GLUCOSE, CAPILLARY
Glucose-Capillary: 134 mg/dL — ABNORMAL HIGH (ref 70–99)
Glucose-Capillary: 155 mg/dL — ABNORMAL HIGH (ref 70–99)
Glucose-Capillary: 93 mg/dL (ref 70–99)

## 2013-09-15 LAB — BASIC METABOLIC PANEL
BUN: 39 mg/dL — ABNORMAL HIGH (ref 6–23)
Calcium: 8.4 mg/dL (ref 8.4–10.5)
Chloride: 109 mEq/L (ref 96–112)
GFR calc non Af Amer: 20 mL/min — ABNORMAL LOW (ref >90)
Glucose, Bld: 95 mg/dL (ref 70–99)

## 2013-09-15 MED ORDER — METOPROLOL TARTRATE 12.5 MG HALF TABLET
12.5000 mg | ORAL_TABLET | Freq: Two times a day (BID) | ORAL | Status: DC
  Administered 2013-09-15 – 2013-09-22 (×15): 12.5 mg via ORAL
  Filled 2013-09-15 (×16): qty 1

## 2013-09-15 MED ORDER — FUROSEMIDE 10 MG/ML IJ SOLN
40.0000 mg | Freq: Two times a day (BID) | INTRAMUSCULAR | Status: DC
  Administered 2013-09-15 – 2013-09-17 (×4): 40 mg via INTRAVENOUS
  Filled 2013-09-15 (×6): qty 4

## 2013-09-15 MED ORDER — HYDRALAZINE HCL 50 MG PO TABS
50.0000 mg | ORAL_TABLET | Freq: Three times a day (TID) | ORAL | Status: DC
  Administered 2013-09-15 – 2013-09-19 (×12): 50 mg via ORAL
  Filled 2013-09-15 (×14): qty 1

## 2013-09-15 NOTE — Progress Notes (Signed)
[facility] PROGRESS NOTE  [name] ZOX:[id number] DOB: 08-Nov-1951 DOA: 09/08/2013 PCP: Willey Blade, MD  Assessment/Plan: #1 large right posterior calf hematoma with large residual wound Status post evacuation and the [facility] per surgery 09/10/2013. Patient is currently off Coumadin. Patient is status post wound VAC placement per plastic surgery. Plastic surgery is following and appreciate input and recommendations. Patient will likely need outpatient followup for the wound care center.  #2 shortness of breath Patient with significant shortness of breath with conversation. Clinical exam consistent with crackles on lung examination. Will check a chest x-ray. Place on Lasix 40 mg IV every 12 hours. Follow.  #3 acute blood loss anemia Secondary to spontaneous hematoma of the lower extremity in the setting of supratherapeutic INR. Patient is status post 7 units packed red blood cells. Hemoglobin is currently stable, at 7.8. Follow.  #4 hypertension Systolic blood pressures elevated. Continue Norvasc and metoprolol. I home regimen of hydralazine. Follow.  #5 chronic anti-coagulation Coumadin on hold.  #6 diabetes mellitus Hemoglobin A1c was 5.9 and 08/18/2013. CBGs have ranged from 93-134. Continue sliding scale insulin.  #7 chronic kidney disease stage IV Patient's current weight is 278 pounds. Patient close to baseline creatinine. Continue diuretics and follow.  #8 grade 2 diastolic and systolic dysfunction Patient is on Lasix 40 mg daily. Patient's short of breath even with conversation. Physical exam with crackles on lung exam. Will check a chest x-ray. Change Lasix to 40 mg IV every 12 hours. Strict I.'s and O.'s. Daily weights.  #9 paroxysmal atrial fibrillation Amiodarone and metoprolol for rate control. Coumadin has been discontinued secondary to hematoma.  #10 morbid obesity  #11 prophylaxis PPI for GI prophylaxis. SCDs for DVT prophylaxis.  Code Status:  Full Family Communication: Updated patient no family at bedside Disposition Plan: Skilled nursing facility when medically stable   Consultants:  Orthopedic Surgery: Dr. Charlann Boxer 09/09/2013  Plastic Surgery : Dr Kelly Splinter 09/13/13  General surgery Dr. Carolynne Edouard 09/10/2013  Procedures: Ct Tibia Fibula Right Wo Contrast 09/09/2013 Large subcutaneous posterior medial calf hematoma.  Dg Chest Port 1 View 09/10/2013 Status post right central line placement without pneumothorax. Crowding of the vascular markings due to a poor inspiratory effort.  Wound Vac 09/15/13 Evacuation of right leg hematoma and excisional  Debridement of the right leg 09/10/2013 per Dr. Carolynne Edouard  Antibiotics:  Levaquin 09/08/13-->09/14/13  HPI/Subjective: Patient SOB during conversation.  Objective: Filed Vitals:   09/15/13 1320  BP: 176/56  Pulse: 62  Temp: 98.2 F (36.8 C)  Resp: 24    Intake/Output Summary (Last 24 hours) at 09/15/13 1813 Last data filed at 09/15/13 1300  Gross per 24 hour  Intake    560 ml  Output   1770 ml  Net  -1210 ml   Filed Weights   09/13/13 0520 09/14/13 0647 09/15/13 0618  Weight: 128.6 kg (283 lb 8.2 oz) 125.2 kg (276 lb 0.3 oz) 126.372 kg (278 lb 9.6 oz)    Exam:   General:  SOB during conversation,  Cardiovascular: RRR no m/r/g  Respiratory: Diffuse crackles  Abdomen: Soft/Obese/NT/ND/+BS  Musculoskeletal: No c/c 1 + BLE edema. RLE with wound vac.   Data Reviewed: Basic Metabolic Panel:  Recent Labs Lab 09/11/13 0900 09/12/13 0500 09/13/13 0352 09/14/13 0510 09/15/13 0515  NA 141 140 140 140 141  K 4.4 4.8 4.9 4.6 4.2  CL 112 110 110 108 109  CO2 22 22 23 25 25   GLUCOSE 118* 85 65* 77 95  BUN 39* 40* 40*  38* 39*  CREATININE 3.01* 2.78* 2.62* 2.46* 2.40*  CALCIUM 7.5* 7.5* 8.3* 8.4 8.4   Liver Function Tests:  Recent Labs Lab 09/09/13 1615  AST 11  ALT 9  ALKPHOS 85  BILITOT 0.4  PROT 5.7*  ALBUMIN 2.1*   No results found for this basename: LIPASE,  AMYLASE,  in the last 168 hours No results found for this basename: AMMONIA,  in the last 168 hours CBC:  Recent Labs Lab 09/08/13 2355  09/11/13 0900 09/12/13 0500 09/13/13 0352 09/14/13 0510 09/15/13 0515  WBC 10.4  < > 10.8* 10.4 9.6 8.4 7.7  NEUTROABS 8.8*  --   --   --   --   --   --   HGB 7.2*  < > 8.4* 7.5* 8.2* 7.9* 7.8*  HCT 23.5*  < > 25.4* 23.8* 25.7* 24.7* 24.6*  MCV 78.1  < > 85.2 86.9 88.3 89.2 89.8  PLT 303  < > 193 200 206 225 216  < > = values in this interval not displayed. Cardiac Enzymes: No results found for this basename: CKTOTAL, CKMB, CKMBINDEX, TROPONINI,  in the last 168 hours BNP (last 3 results)  Recent Labs  02/15/13 0434 07/05/13 1536 08/03/13 1122  PROBNP 438.6* 1581.0* 1789.0*   CBG:  Recent Labs Lab 09/14/13 1707 09/14/13 2103 09/15/13 0716 09/15/13 1141 09/15/13 1621  GLUCAP 138* 124* 112* 93 134*    Recent Results (from the past 240 hour(s))  MRSA PCR SCREENING     Status: Abnormal   Collection Time    09/09/13  4:29 AM      Result Value Range Status   MRSA by PCR POSITIVE (*) NEGATIVE Final   Comment:            The GeneXpert MRSA Assay (FDA     approved for NASAL specimens     only), is one component of a     comprehensive MRSA colonization     surveillance program. It is not     intended to diagnose MRSA     infection nor to guide or     monitor treatment for     MRSA infections.     RESULT CALLED TO, READ BACK BY AND VERIFIED WITH:     Dorris Fetch 09/09/13 0825 BY K SCHULTZ     Performed at [facility]     Studies: No results found.  Scheduled Meds: . allopurinol  100 mg Oral Daily  . amiodarone  200 mg Oral Daily  . amLODipine  10 mg Oral Daily  . atorvastatin  20 mg Oral QHS  . feeding supplement (RESOURCE BREEZE)  1 Container Oral BID BM  . ferrous sulfate  325 mg Oral TID WC  . furosemide  40 mg Intravenous Q12H  . hydrALAZINE  50 mg Oral TID  . insulin aspart  0-9 Units Subcutaneous TID WC  .  metoprolol tartrate  12.5 mg Oral BID  . pantoprazole  40 mg Oral Daily  . polyethylene glycol  17 g Oral BID  . senna-docusate  1 tablet Oral BID  . sodium chloride  10 mL Intravenous Q12H  . sodium chloride  3 mL Intravenous Q12H  . sodium chloride  3 mL Intravenous Q12H   Continuous Infusions:   Principal Problem:   Spontaneous hematoma of lower leg Active Problems:   HYPERTENSION   DM (diabetes mellitus)   Chronic anticoagulation   CKD (chronic kidney disease), stage IV   Anemia of chronic disease  PAF (paroxysmal atrial fibrillation)   Morbid obesity   Supratherapeutic INR   SOB (shortness of breath)    Time spent: > 35 mins    [facility]  [facility] Pager [phone]. If 7PM-7AM, please contact night-coverage at [email or web address], password [facility] 09/15/2013, 6:13 PM  LOS: 7 days

## 2013-09-15 NOTE — Progress Notes (Signed)
NUTRITION FOLLOW-UP  DOCUMENTATION CODES Per approved criteria  -Morbid Obesity   INTERVENTION: Continue Resource Breeze po BID, each supplement provides 250 kcal and 9 grams of protein.  RD to continue to follow nutrition care plan.  NUTRITION DIAGNOSIS: Inadequate oral intake r/t poor appetite AEB pt report. Improving.  Goal: PO intake to meet >/=90% estimated nutrition needs. Improving.  Monitor:  PO intake, supplement tolerance, weight trends, labs   ASSESSMENT: PMHx significant for coumadin use for afib, CKD IV, DM (most recent A1c 5.9.) Admitted with spontaneous hematoma to R calf. Recent admission to [facility] for BRBPR.   Underwent the following on 10/3: EVACUATION OF RIGHT LEG HEMATOMA AND EXCISION DEBRIDIMENT RIGHT LEG  Per most recent MD note, pt has received a total of 7 units of PRBC since admission. Question need for wound vac or skin graft at this time, s/p procedure.  Currently ordered for a Carbohydrate Modified Medium diet. Blood sugars better controlled, now ranging from 95 - 138. Pt reports that she is eating better now. Still limited by pain, but confirms that her blood sugars are better now. Taking Breeze as scheduled per her report.  Height: Ht Readings from Last 1 Encounters:  09/09/13 5\' 3"  (1.6 m)    Weight: Wt Readings from Last 1 Encounters:  09/15/13 278 lb 9.6 oz (126.372 kg)  Admit wt 280 lb - stable.  BMI:  Body mass index is 49.36 kg/(m^2). Obesity class 3, extreme  Estimated Nutritional Needs: Kcal: 2000-2200 Protein: 85-100 gm  Fluid: 2-2.2 L   Skin:  R leg incision Fissure on buttocks  Diet Order: Carb Control Medium  EDUCATION NEEDS: -No education needs identified at this time   Intake/Output Summary (Last 24 hours) at 09/15/13 0916 Last data filed at 09/15/13 0622  Gross per 24 hour  Intake    530 ml  Output   1600 ml  Net  -1070 ml    Last BM: 10/3  Labs:   Recent Labs Lab 09/13/13 0352 09/14/13 0510  09/15/13 0515  NA 140 140 141  K 4.9 4.6 4.2  CL 110 108 109  CO2 23 25 25   BUN 40* 38* 39*  CREATININE 2.62* 2.46* 2.40*  CALCIUM 8.3* 8.4 8.4  GLUCOSE 65* 77 95    CBG (last 3)   Recent Labs  09/14/13 1707 09/14/13 2103 09/15/13 0716  GLUCAP 138* 124* 112*    Scheduled Meds: . allopurinol  100 mg Oral Daily  . amiodarone  200 mg Oral Daily  . amLODipine  10 mg Oral Daily  . atorvastatin  20 mg Oral QHS  . feeding supplement (RESOURCE BREEZE)  1 Container Oral BID BM  . ferrous sulfate  325 mg Oral TID WC  . furosemide  40 mg Oral Daily  . insulin aspart  0-9 Units Subcutaneous TID WC  . metoprolol tartrate  12.5 mg Oral BID  . pantoprazole  40 mg Oral Daily  . polyethylene glycol  17 g Oral BID  . senna-docusate  1 tablet Oral BID  . sodium chloride  10 mL Intravenous Q12H  . sodium chloride  3 mL Intravenous Q12H  . sodium chloride  3 mL Intravenous Q12H    Continuous Infusions:  none  Jarold Motto MS, RD, LDN Pager: [phone] After-hours pager: [phone]

## 2013-09-15 NOTE — Progress Notes (Signed)
Pt's HR dropped down into the mid 40's to 50's and sustains.   BP 137/49.  Pt had scheduled Metoprolol to be given. Notified NP on call, K.Kirby and instructed to hold med for tonight. Will continue to monitor pt.

## 2013-09-15 NOTE — Progress Notes (Signed)
5 Days Post-Op  Subjective: The patient reports moderate to severe pain in the right leg with the dressing change/application of Acell and the VAC dressing today.  She reports that she may be going to a short term rehab once discharged.   Acell powder and sheets were applied to the wound bed and adaptic, surgical lubricant and the VAC dressing were applied this morning.  The wound measures approximately 14.0 x 13.5 x 1 cm with undermining from 3 pm to 5 pm of 3.5 cm. There is some necrosis of the wound edges and she may require further debridement in the OR.   Objective: Vital signs in last 24 hours: Temp:  [98.1 F (36.7 C)-98.2 F (36.8 C)] 98.2 F (36.8 C) (10/08 1320) Pulse Rate:  [52-62] 62 (10/08 1320) Resp:  [16-24] 24 (10/08 1320) BP: (137-176)/(49-56) 161/51 mmHg (10/08 1827) SpO2:  [97 %-100 %] 97 % (10/08 1320) Weight:  [126.372 kg (278 lb 9.6 oz)] 126.372 kg (278 lb 9.6 oz) (10/08 0618) Last BM Date: 09/10/13  Intake/Output from previous day: 10/07 0701 - 10/08 0700 In: 890 [P.O.:600; I.V.:290] Out: 1600 [Urine:1600] Intake/Output this shift:    General appearance: alert, cooperative, moderate distress, severe distress and morbidly obese m The wound measures approximately 14.0 x 13.5 x 1 cm with undermining from 3 pm to 5 pm of 3.5 cm. There is some necrosis of the wound edges. The wound bed is mostly dark pink and there is some minimal bloody drainage and some odor present.   Lab Results:   Recent Labs  09/14/13 0510 09/15/13 0515  WBC 8.4 7.7  HGB 7.9* 7.8*  HCT 24.7* 24.6*  PLT 225 216   BMET  Recent Labs  09/14/13 0510 09/15/13 0515  NA 140 141  K 4.6 4.2  CL 108 109  CO2 25 25  GLUCOSE 77 95  BUN 38* 39*  CREATININE 2.46* 2.40*  CALCIUM 8.4 8.4   PT/INR No results found for this basename: LABPROT, INR,  in the last 72 hours ABG No results found for this basename: PHART, PCO2, PO2, HCO3,  in the last 72 hours  Studies/Results: No results  found.  Anti-infectives: Anti-infectives   Start     Dose/Rate Route Frequency Ordered Stop   09/10/13 1316  vancomycin (VANCOCIN) 1,500 mg in sodium chloride 0.9 % 500 mL IVPB  Status:  Discontinued     1,500 mg 250 mL/hr over 120 Minutes Intravenous 90 min pre-op 09/10/13 1316 09/10/13 1556   09/10/13 1315  vancomycin (VANCOCIN) IVPB 1000 mg/200 mL premix  Status:  Discontinued     1,000 mg 200 mL/hr over 60 Minutes Intravenous  Once 09/10/13 1311 09/10/13 1450   09/09/13 0500  levofloxacin (LEVAQUIN) tablet 250 mg  Status:  Discontinued     250 mg Oral Daily 09/09/13 0427 09/11/13 1441      Assessment/Plan: s/p Procedure(s): EVACUATION OF RIGHT LEG HEMATOMA AND EXCISION DEBRIDIMENT RIGHT LEG  (Right) Acell was applied to the wound  Powder- 2000mg  total, REF MM1000 LOT [id number] EXP 06/08/15 SER [id number] and  REF MM1000 LOT [id number] EXP 06/08/15 SER [id number] Acell Burn Matrix sheets 2- 10 x 15 cm  REF [id number] LOT [id number] EXP 07/09/15 SER [id number] and REF [id number] LOT [id number] EXP 07/09/15 SER [id number]   She may require further debridement in the OR due to both pain control issues with the dressing change and if she has further necrosis of the wound edges.  Will plan to change the dressing again next  week. If she is discharged, she will need negative pressure wound therapy to continue and will need follow up in the wound care clinic on Monday mornings.     LOS: 7 days    Franki Monte 09/15/2013 Plastic Surgery [phone]

## 2013-09-15 NOTE — Progress Notes (Signed)
PT Cancellation Note  ___Treatment cancelled today due to medical issues with patient which prohibited therapy  ___ Treatment cancelled today due to patient receiving procedure or test   ___ Treatment cancelled today due to patient's refusal to participate   _X_ AM PT Treatment cancelled today due to pt's request.  Stated "now is not a good time".  Pt had concerns about the wound VAC and wanted to know if it was going to hurt.  Felecia Shelling  PTA [facility]  Acute  Rehab Pager      [phone]

## 2013-09-15 NOTE — Consult Note (Signed)
Agree with the above information and treatment plan.

## 2013-09-16 DIAGNOSIS — I5043 Acute on chronic combined systolic (congestive) and diastolic (congestive) heart failure: Secondary | ICD-10-CM

## 2013-09-16 LAB — GLUCOSE, CAPILLARY
Glucose-Capillary: 109 mg/dL — ABNORMAL HIGH (ref 70–99)
Glucose-Capillary: 112 mg/dL — ABNORMAL HIGH (ref 70–99)
Glucose-Capillary: 104 mg/dL — ABNORMAL HIGH (ref 70–99)
Glucose-Capillary: 159 mg/dL — ABNORMAL HIGH (ref 70–99)

## 2013-09-16 LAB — CBC
HCT: 25.2 % — ABNORMAL LOW (ref 36.0–46.0)
Hemoglobin: 7.8 g/dL — ABNORMAL LOW (ref 12.0–15.0)
RDW: 17.1 % — ABNORMAL HIGH (ref 11.5–15.5)
WBC: 7.6 10*3/uL (ref 4.0–10.5)

## 2013-09-16 LAB — BASIC METABOLIC PANEL
Chloride: 105 mEq/L (ref 96–112)
GFR calc Af Amer: 27 mL/min — ABNORMAL LOW (ref >90)
Potassium: 4.1 mEq/L (ref 3.5–5.1)

## 2013-09-16 MED ORDER — FLEET ENEMA 7-19 GM/118ML RE ENEM
1.0000 | ENEMA | RECTAL | Status: DC | PRN
  Administered 2013-09-16: 21:00:00 1 via RECTAL
  Filled 2013-09-16: qty 1

## 2013-09-16 MED ORDER — BISACODYL 10 MG RE SUPP: 10.0000 mg | Freq: Every day | RECTAL | Status: DC | PRN

## 2013-09-16 MED ORDER — SENNA 8.6 MG PO TABS
2.0000 | ORAL_TABLET | Freq: Every day | ORAL | Status: DC
  Administered 2013-09-16 – 2013-09-19 (×4): 17.2 mg via ORAL
  Filled 2013-09-16 (×5): qty 2

## 2013-09-16 NOTE — Clinical Social Work Psychosocial (Signed)
     Clinical Social Work Department BRIEF PSYCHOSOCIAL ASSESSMENT 09/16/2013  Patient:  [name], [name]     Account Number:  [id number]     Admit date:  09/08/2013  Clinical Social Worker:  Hattie Perch  Date/Time:  09/16/2013 12:00 M  Referred by:  Physician  Date Referred:  09/16/2013 Referred for  SNF Placement   Other Referral:   Interview type:  Patient Other interview type:    PSYCHOSOCIAL DATA Living Status:  FAMILY Admitted from facility:   Level of care:   Primary support name:  Clelia Croft Primary support relationship to patient:  SPOUSE Degree of support available:   good    CURRENT CONCERNS Current Concerns  Post-Acute Placement   Other Concerns:    SOCIAL WORK ASSESSMENT / PLAN CSW met with patient. patient is alert and oriented X3. patient in need of snf placement. patient states that she has previously been to [facility] and had a good experience with the therapy but a bad experience with the staff. she would prefer to go to [facility].   Assessment/plan status:   Other assessment/ plan:   Information/referral to community resources:    PATIENTS/FAMILYS RESPONSE TO PLAN OF CARE: patient is agreeable to snf. was previously unhappy with [facility] but willing to try [facility].

## 2013-09-16 NOTE — Progress Notes (Signed)
Agree with the above plan 

## 2013-09-16 NOTE — Progress Notes (Signed)
[facility] PROGRESS NOTE  [name] ZOX:[id number] DOB: Nov 10, 1951 DOA: 09/08/2013 PCP: Willey Blade, MD  Assessment/Plan: #1 large right posterior calf hematoma with large residual wound Status post evacuation and debridement per surgery 09/10/2013. Patient is currently off Coumadin. Patient is status post wound VAC placement per plastic surgery. Plastic surgery is following and appreciate input and recommendations. Patient will likely need outpatient followup at the wound care center.  #2 Volume overload Patient with significant shortness of breath with conversation yesterday. Clinical exam improved. Chest x-ray c/w volume overload. Continue Lasix 40 mg IV every 12 hours. Follow.  #3 acute blood loss anemia Secondary to spontaneous hematoma of the lower extremity in the setting of supratherapeutic INR. Patient is status post 7 units packed red blood cells. Hemoglobin is currently stable, at 7.8. Follow.  #4 hypertension Systolic blood pressures elevated. Continue Norvasc, metoprolol, hydralazine. Follow.  #5 chronic anti-coagulation Coumadin on hold.  #6 diabetes mellitus Hemoglobin A1c was 5.9 and 08/18/2013. CBGs have ranged from 109-155. Continue sliding scale insulin.  #7 chronic kidney disease stage IV Patient's current weight is 278 pounds. Patient close to baseline creatinine. Continue diuretics and follow.  #8 grade 2 diastolic and systolic dysfunction Patient is on Lasix 40 mg daily. Patient was short of breath even with conversation yesterday. Clinical improvement. Physical exam improved. CXR c/w volume overload. Continue Lasix to 40 mg IV every 12 hours. Strict I.'s and O.'s. Daily weights.  #9 paroxysmal atrial fibrillation Amiodarone and metoprolol for rate control. Coumadin has been discontinued secondary to hematoma.  #10 morbid obesity  #11 prophylaxis PPI for GI prophylaxis. SCDs for DVT prophylaxis.  Code Status: Full Family Communication:  Updated patient no family at bedside Disposition Plan: Skilled nursing facility when medically stable   Consultants:  Orthopedic Surgery: Dr. Charlann Boxer 09/09/2013  Plastic Surgery : Dr Kelly Splinter 09/13/13  General surgery Dr. Carolynne Edouard 09/10/2013  Procedures: Ct Tibia Fibula Right Wo Contrast 09/09/2013 Large subcutaneous posterior medial calf hematoma.  Dg Chest Port 1 View 09/10/2013 Status post right central line placement without pneumothorax. Crowding of the vascular markings due to a poor inspiratory effort.  Wound Vac 09/15/13 Evacuation of right leg hematoma and excisional  Debridement of the right leg 09/10/2013 per Dr. Carolynne Edouard  Antibiotics:  Levaquin 09/08/13-->09/14/13  HPI/Subjective: Patient states SOB improved.  Objective: Filed Vitals:   09/16/13 0517  BP: 164/47  Pulse: 52  Temp: 98 F (36.7 C)  Resp: 20    Intake/Output Summary (Last 24 hours) at 09/16/13 1455 Last data filed at 09/16/13 0600  Gross per 24 hour  Intake    520 ml  Output   2100 ml  Net  -1580 ml   Filed Weights   09/14/13 0647 09/15/13 0618 09/16/13 0500  Weight: 125.2 kg (276 lb 0.3 oz) 126.372 kg (278 lb 9.6 oz) 127.8 kg (281 lb 12 oz)    Exam:   General:  SOB during conversation,  Cardiovascular: RRR no m/r/g  Respiratory: Bibasilar crackles. No wheezing.  Abdomen: Soft/Obese/NT/ND/+BS  Musculoskeletal: No c/c 1-2 + BLE edema. RLE with wound vac.   Data Reviewed: Basic Metabolic Panel:  Recent Labs Lab 09/12/13 0500 09/13/13 0352 09/14/13 0510 09/15/13 0515 09/16/13 0510  NA 140 140 140 141 139  K 4.8 4.9 4.6 4.2 4.1  CL 110 110 108 109 105  CO2 22 23 25 25 27   GLUCOSE 85 65* 77 95 89  BUN 40* 40* 38* 39* 35*  CREATININE 2.78* 2.62* 2.46* 2.40* 2.15*  CALCIUM  7.5* 8.3* 8.4 8.4 8.5   Liver Function Tests:  Recent Labs Lab 09/09/13 1615  AST 11  ALT 9  ALKPHOS 85  BILITOT 0.4  PROT 5.7*  ALBUMIN 2.1*   No results found for this basename: LIPASE, AMYLASE,  in the  last 168 hours No results found for this basename: AMMONIA,  in the last 168 hours CBC:  Recent Labs Lab 09/12/13 0500 09/13/13 0352 09/14/13 0510 09/15/13 0515 09/16/13 0510  WBC 10.4 9.6 8.4 7.7 7.6  HGB 7.5* 8.2* 7.9* 7.8* 7.8*  HCT 23.8* 25.7* 24.7* 24.6* 25.2*  MCV 86.9 88.3 89.2 89.8 88.7  PLT 200 206 225 216 263   Cardiac Enzymes: No results found for this basename: CKTOTAL, CKMB, CKMBINDEX, TROPONINI,  in the last 168 hours BNP (last 3 results)  Recent Labs  02/15/13 0434 07/05/13 1536 08/03/13 1122  PROBNP 438.6* 1581.0* 1789.0*   CBG:  Recent Labs Lab 09/15/13 1141 09/15/13 1621 09/15/13 2331 09/16/13 0842 09/16/13 1205  GLUCAP 93 134* 155* 109* 112*    Recent Results (from the past 240 hour(s))  MRSA PCR SCREENING     Status: Abnormal   Collection Time    09/09/13  4:29 AM      Result Value Range Status   MRSA by PCR POSITIVE (*) NEGATIVE Final   Comment:            The GeneXpert MRSA Assay (FDA     approved for NASAL specimens     only), is one component of a     comprehensive MRSA colonization     surveillance program. It is not     intended to diagnose MRSA     infection nor to guide or     monitor treatment for     MRSA infections.     RESULT CALLED TO, READ BACK BY AND VERIFIED WITH:     Dorris Fetch 09/09/13 0825 BY K SCHULTZ     Performed at [facility]     Studies: Dg Chest Port 1 View  09/16/2013   *RADIOLOGY REPORT*  Clinical Data: Shortness of breath and weakness.  PORTABLE CHEST - 1 VIEW  Comparison: Chest radiograph performed 09/10/2013  Findings: The patient's right IJ line is noted ending about the proximal SVC.  The lungs are hypoexpanded.  Vascular crowding and vascular congestion are seen.  Increased interstitial markings are most compatible with mildly worsened pulmonary edema.  A small left pleural effusion is suspected.  No pneumothorax is seen.  The cardiomediastinal silhouette is enlarged.  No acute osseous  abnormalities are identified.  IMPRESSION: Lungs hypoexpanded.  Vascular congestion and cardiomegaly noted. Increased interstitial markings are most compatible with mildly worsened pulmonary edema.   Original Report Authenticated By: Tonia Ghent, M.D.    Scheduled Meds: . allopurinol  100 mg Oral Daily  . amiodarone  200 mg Oral Daily  . amLODipine  10 mg Oral Daily  . atorvastatin  20 mg Oral QHS  . feeding supplement (RESOURCE BREEZE)  1 Container Oral BID BM  . ferrous sulfate  325 mg Oral TID WC  . furosemide  40 mg Intravenous Q12H  . hydrALAZINE  50 mg Oral TID  . insulin aspart  0-9 Units Subcutaneous TID WC  . metoprolol tartrate  12.5 mg Oral BID  . pantoprazole  40 mg Oral Daily  . polyethylene glycol  17 g Oral BID  . senna-docusate  1 tablet Oral BID  . sodium chloride  10 mL Intravenous  Q12H  . sodium chloride  3 mL Intravenous Q12H  . sodium chloride  3 mL Intravenous Q12H   Continuous Infusions:   Principal Problem:   Spontaneous hematoma of lower leg Active Problems:   HYPERTENSION   DM (diabetes mellitus)   Chronic anticoagulation   CKD (chronic kidney disease), stage IV   Anemia of chronic disease   PAF (paroxysmal atrial fibrillation)   Morbid obesity   Supratherapeutic INR   SOB (shortness of breath)    Time spent: > 35 mins    [facility]  [facility] Pager [phone]. If 7PM-7AM, please contact night-coverage at [email or web address], password [facility] 09/16/2013, 2:55 PM  LOS: 8 days

## 2013-09-17 DIAGNOSIS — I10 Essential (primary) hypertension: Secondary | ICD-10-CM

## 2013-09-17 LAB — GLUCOSE, CAPILLARY
Glucose-Capillary: 97 mg/dL (ref 70–99)
Glucose-Capillary: 183 mg/dL — ABNORMAL HIGH (ref 70–99)
Glucose-Capillary: 105 mg/dL — ABNORMAL HIGH (ref 70–99)

## 2013-09-17 LAB — BASIC METABOLIC PANEL
BUN: 36 mg/dL — ABNORMAL HIGH (ref 6–23)
CO2: 29 mEq/L (ref 19–32)
Chloride: 106 mEq/L (ref 96–112)
Creatinine, Ser: 2.22 mg/dL — ABNORMAL HIGH (ref 0.50–1.10)
GFR calc Af Amer: 26 mL/min — ABNORMAL LOW (ref >90)
GFR calc non Af Amer: 22 mL/min — ABNORMAL LOW (ref >90)
Potassium: 3.9 mEq/L (ref 3.5–5.1)

## 2013-09-17 LAB — CBC
HCT: 25.4 % — ABNORMAL LOW (ref 36.0–46.0)
MCH: 28.2 pg (ref 26.0–34.0)
MCHC: 31.9 g/dL (ref 30.0–36.0)
RBC: 2.87 MIL/uL — ABNORMAL LOW (ref 3.87–5.11)
RDW: 16.7 % — ABNORMAL HIGH (ref 11.5–15.5)
WBC: 8.8 10*3/uL (ref 4.0–10.5)

## 2013-09-17 MED ORDER — FUROSEMIDE 40 MG PO TABS
40.0000 mg | ORAL_TABLET | Freq: Every day | ORAL | Status: DC
  Administered 2013-09-17 – 2013-09-22 (×6): 40 mg via ORAL
  Filled 2013-09-17 (×7): qty 1

## 2013-09-17 NOTE — Progress Notes (Signed)
[facility] PROGRESS NOTE  [name] JYN:[id number] DOB: 08/31/1951 DOA: 09/08/2013 PCP: Willey Blade, MD  Assessment/Plan: #1 large right posterior calf hematoma with large residual wound Status post evacuation and debridement per surgery 09/10/2013. Patient is currently off Coumadin. Patient is status post wound VAC placement per plastic surgery. Plastic surgery is following and appreciate input and recommendations. Patient will likely need outpatient followup at the wound care center.  #2 Volume overload Patient with significant shortness of breath with conversation yesterday. Clinical exam improved. Chest x-ray c/w volume overload. Change IV  Lasix to oral.  #3 acute blood loss anemia Secondary to spontaneous hematoma of the lower extremity in the setting of supratherapeutic INR. Patient is status post 7 units packed red blood cells. Hemoglobin is currently stable, at 8.1. Follow.  #4 hypertension Systolic blood pressures improving. Continue Norvasc, metoprolol, hydralazine. Follow.  #5 chronic anti-coagulation Coumadin on hold.  #6 diabetes mellitus Hemoglobin A1c was 5.9 and 08/18/2013. CBGs have ranged from 105-183. Continue sliding scale insulin.  #7 chronic kidney disease stage IV Patient's current weight is 278 pounds. Patient close to baseline creatinine. Continue diuretics and follow.  #8 grade 2 diastolic and systolic dysfunction Clinical improvement. Physical exam improved. CXR c/w volume overload. Change IV  Lasix to oral. Strict I.'s and O.'s. Daily weights.  #9 paroxysmal atrial fibrillation Amiodarone and metoprolol for rate control. Coumadin has been discontinued secondary to hematoma.  #10 morbid obesity  #11 prophylaxis PPI for GI prophylaxis. SCDs for DVT prophylaxis.  Code Status: Full Family Communication: Updated patient no family at bedside Disposition Plan: Skilled nursing facility when medically stable   Consultants:  Orthopedic  Surgery: Dr. Charlann Boxer 09/09/2013  Plastic Surgery : Dr Kelly Splinter 09/13/13  General surgery Dr. Carolynne Edouard 09/10/2013  Procedures: Ct Tibia Fibula Right Wo Contrast 09/09/2013 Large subcutaneous posterior medial calf hematoma.  Dg Chest Port 1 View 09/10/2013 Status post right central line placement without pneumothorax. Crowding of the vascular markings due to a poor inspiratory effort.  Wound Vac 09/15/13 Evacuation of right leg hematoma and excisional  Debridement of the right leg 09/10/2013 per Dr. Carolynne Edouard  Antibiotics:  Levaquin 09/08/13-->09/14/13  HPI/Subjective: Patient states SOB improved. No complaints.  Objective: Filed Vitals:   09/17/13 0308  BP: 157/44  Pulse: 62  Temp: 98 F (36.7 C)  Resp: 20    Intake/Output Summary (Last 24 hours) at 09/17/13 1434 Last data filed at 09/17/13 0815  Gross per 24 hour  Intake    240 ml  Output   4250 ml  Net  -4010 ml   Filed Weights   09/14/13 0647 09/15/13 0618 09/16/13 0500  Weight: 125.2 kg (276 lb 0.3 oz) 126.372 kg (278 lb 9.6 oz) 127.8 kg (281 lb 12 oz)    Exam:   General:  SOB during conversation,  Cardiovascular: RRR no m/r/g  Respiratory: CTAB anterior lung fields. No wheezing.  Abdomen: Soft/Obese/NT/ND/+BS  Musculoskeletal: No c/c 1 + BLE edema. RLE with wound vac.   Data Reviewed: Basic Metabolic Panel:  Recent Labs Lab 09/13/13 0352 09/14/13 0510 09/15/13 0515 09/16/13 0510 09/17/13 0540  NA 140 140 141 139 142  K 4.9 4.6 4.2 4.1 3.9  CL 110 108 109 105 106  CO2 23 25 25 27 29   GLUCOSE 65* 77 95 89 116*  BUN 40* 38* 39* 35* 36*  CREATININE 2.62* 2.46* 2.40* 2.15* 2.22*  CALCIUM 8.3* 8.4 8.4 8.5 8.5   Liver Function Tests: No results found for this basename: AST, ALT,  ALKPHOS, BILITOT, PROT, ALBUMIN,  in the last 168 hours No results found for this basename: LIPASE, AMYLASE,  in the last 168 hours No results found for this basename: AMMONIA,  in the last 168 hours CBC:  Recent Labs Lab  09/13/13 0352 09/14/13 0510 09/15/13 0515 09/16/13 0510 09/17/13 0540  WBC 9.6 8.4 7.7 7.6 8.8  HGB 8.2* 7.9* 7.8* 7.8* 8.1*  HCT 25.7* 24.7* 24.6* 25.2* 25.4*  MCV 88.3 89.2 89.8 88.7 88.5  PLT 206 225 216 263 296   Cardiac Enzymes: No results found for this basename: CKTOTAL, CKMB, CKMBINDEX, TROPONINI,  in the last 168 hours BNP (last 3 results)  Recent Labs  02/15/13 0434 07/05/13 1536 08/03/13 1122  PROBNP 438.6* 1581.0* 1789.0*   CBG:  Recent Labs Lab 09/15/13 2331 09/16/13 0842 09/16/13 1205 09/16/13 1649 09/16/13 2055  GLUCAP 155* 109* 112* 104* 159*    Recent Results (from the past 240 hour(s))  MRSA PCR SCREENING     Status: Abnormal   Collection Time    09/09/13  4:29 AM      Result Value Range Status   MRSA by PCR POSITIVE (*) NEGATIVE Final   Comment:            The GeneXpert MRSA Assay (FDA     approved for NASAL specimens     only), is one component of a     comprehensive MRSA colonization     surveillance program. It is not     intended to diagnose MRSA     infection nor to guide or     monitor treatment for     MRSA infections.     RESULT CALLED TO, READ BACK BY AND VERIFIED WITH:     Dorris Fetch 09/09/13 0825 BY K SCHULTZ     Performed at [facility]     Studies: Dg Chest Port 1 View  09/16/2013   *RADIOLOGY REPORT*  Clinical Data: Shortness of breath and weakness.  PORTABLE CHEST - 1 VIEW  Comparison: Chest radiograph performed 09/10/2013  Findings: The patient's right IJ line is noted ending about the proximal SVC.  The lungs are hypoexpanded.  Vascular crowding and vascular congestion are seen.  Increased interstitial markings are most compatible with mildly worsened pulmonary edema.  A small left pleural effusion is suspected.  No pneumothorax is seen.  The cardiomediastinal silhouette is enlarged.  No acute osseous abnormalities are identified.  IMPRESSION: Lungs hypoexpanded.  Vascular congestion and cardiomegaly noted. Increased  interstitial markings are most compatible with mildly worsened pulmonary edema.   Original Report Authenticated By: Tonia Ghent, M.D.    Scheduled Meds: . allopurinol  100 mg Oral Daily  . amiodarone  200 mg Oral Daily  . amLODipine  10 mg Oral Daily  . atorvastatin  20 mg Oral QHS  . feeding supplement (RESOURCE BREEZE)  1 Container Oral BID BM  . ferrous sulfate  325 mg Oral TID WC  . furosemide  40 mg Oral Daily  . hydrALAZINE  50 mg Oral TID  . insulin aspart  0-9 Units Subcutaneous TID WC  . metoprolol tartrate  12.5 mg Oral BID  . pantoprazole  40 mg Oral Daily  . polyethylene glycol  17 g Oral BID  . senna  2 tablet Oral QHS  . sodium chloride  10 mL Intravenous Q12H  . sodium chloride  3 mL Intravenous Q12H  . sodium chloride  3 mL Intravenous Q12H   Continuous Infusions:   Principal  Problem:   Spontaneous hematoma of lower leg Active Problems:   HYPERTENSION   DM (diabetes mellitus)   Chronic anticoagulation   CKD (chronic kidney disease), stage IV   Anemia of chronic disease   PAF (paroxysmal atrial fibrillation)   Morbid obesity   Supratherapeutic INR   SOB (shortness of breath)    Time spent: > 35 mins    [facility]  [facility] Pager [phone]. If 7PM-7AM, please contact night-coverage at [email or web address], password [facility] 09/17/2013, 2:34 PM  LOS: 9 days

## 2013-09-18 DIAGNOSIS — E8779 Other fluid overload: Secondary | ICD-10-CM

## 2013-09-18 LAB — CBC
HCT: 25.3 % — ABNORMAL LOW (ref 36.0–46.0)
Hemoglobin: 7.9 g/dL — ABNORMAL LOW (ref 12.0–15.0)
MCHC: 31.2 g/dL (ref 30.0–36.0)
Platelets: 297 10*3/uL (ref 150–400)
RDW: 16.6 % — ABNORMAL HIGH (ref 11.5–15.5)

## 2013-09-18 LAB — BASIC METABOLIC PANEL
BUN: 41 mg/dL — ABNORMAL HIGH (ref 6–23)
CO2: 29 mEq/L (ref 19–32)
Calcium: 8.3 mg/dL — ABNORMAL LOW (ref 8.4–10.5)
Chloride: 104 mEq/L (ref 96–112)
Creatinine, Ser: 2.52 mg/dL — ABNORMAL HIGH (ref 0.50–1.10)
GFR calc Af Amer: 22 mL/min — ABNORMAL LOW (ref >90)

## 2013-09-18 NOTE — Progress Notes (Signed)
Physical Therapy Treatment Patient Details Name: [name] MRN: [id number] DOB: 07-03-1951 Today's Date: 09/18/2013 Time: 4098-1191 PT Time Calculation (min): 20 min  PT Assessment / Plan / Recommendation  History of Present Illness Pt needs much encouragement to participate   PT Comments   Pt with c/o pain with Left leg exercise and refused to get up to the edge of the bed or to chair.  She did well with exercise with right leg. Applied lotion to dry skin on both legs, but pt may benefit from something like eucerin cream on hyperkeratotic areas.  Consider premedication for pain relief prior to next treatment  Follow Up Recommendations  SNF     Does the patient have the potential to tolerate intense rehabilitation     Barriers to Discharge        Equipment Recommendations  None recommended by PT    Recommendations for Other Services OT consult  Frequency Min 3X/week   Progress towards PT Goals Progress towards PT goals: Not progressing toward goals - comment  Plan Discharge plan needs to be updated;Current plan remains appropriate    Precautions / Restrictions     Pertinent Vitals/Pain Pt c/o pain in left leg    Mobility  Bed Mobility Bed Mobility: Rolling Right Details for Bed Mobility Assistance: pt able to roll to right, but not willing to roll to left Transfers Details for Transfer Assistance: Pt refused to sit on the edge of the bed or stand to get to chair Ambulation/Gait Ambulation/Gait Assistance: Not tested (comment) Gait Pattern: Decreased step length - right;Decreased step length - left General Gait Details: pt deferred Stairs: No Wheelchair Mobility Wheelchair Mobility: No    Exercises     PT Diagnosis:    PT Problem List:   PT Treatment Interventions:     PT Goals (current goals can now be found in the care plan section)    Visit Information  Last PT Received On: 09/18/13 Assistance Needed: +2 History of Present Illness: Pt needs much  encouragement to participate    Subjective Data      Cognition  Cognition Arousal/Alertness: Awake/alert Behavior During Therapy: WFL for tasks assessed/performed Overall Cognitive Status: Within Functional Limits for tasks assessed Area of Impairment: Safety/judgement;Awareness Safety/Judgement: Decreased awareness of safety;Decreased awareness of deficits General Comments: Pt adamantly refused to get out of bed "because it hurts"  She cannot be specific about location, but states it is the left leg (not the leg with the wound vac)    Balance     End of Session PT - End of Session Activity Tolerance: Patient limited by pain;Patient limited by fatigue Patient left: in bed;with call bell/phone within reach   GP    [facility] K. [place], [place] [phone] 09/18/2013, 4:34 PM

## 2013-09-18 NOTE — Progress Notes (Signed)
[facility] PROGRESS NOTE  [name] LKG:[id number] DOB: 1951-06-04 DOA: 09/08/2013 PCP: Willey Blade, MD  Assessment/Plan: #1 large right posterior calf hematoma with large residual wound Status post evacuation and debridement per surgery 09/10/2013. Patient is currently off Coumadin. Patient is status post wound VAC placement per plastic surgery. Plastic surgery is following and appreciate input and recommendations. Patient will likely need outpatient followup at the wound care center.  #2 Volume overload Patient with significant shortness of breath with conversation 2 days ago. Clinical exam improved. Chest x-ray was c/w volume overload. Continue oral Lasix.  #3 acute blood loss anemia Secondary to spontaneous hematoma of the lower extremity in the setting of supratherapeutic INR. Patient is status post 7 units packed red blood cells. Hemoglobin is currently stable, at 7.9. Follow.  #4 hypertension Systolic blood pressures improving. Continue Norvasc, metoprolol, hydralazine. Follow.  #5 chronic anti-coagulation Coumadin on hold.  #6 diabetes mellitus Hemoglobin A1c was 5.9 and 08/18/2013. CBGs have ranged from 103-152. Continue sliding scale insulin.  #7 chronic kidney disease stage IV Patient's current weight is 266 pounds from 278 pounds. Patient close to baseline creatinine. Continue diuretics and follow.  #8 grade 2 diastolic and systolic dysfunction Clinical improvement. Physical exam improved. CXR c/w volume overload. Continue oral lasix.  Strict I.'s and O.'s. Daily weights.  #9 paroxysmal atrial fibrillation Amiodarone and metoprolol for rate control. Coumadin has been discontinued secondary to hematoma.  #10 morbid obesity  #11 prophylaxis PPI for GI prophylaxis. SCDs for DVT prophylaxis.  Code Status: Full Family Communication: Updated patient no family at bedside Disposition Plan: Skilled nursing facility when medically  stable   Consultants:  Orthopedic Surgery: Dr. Charlann Boxer 09/09/2013  Plastic Surgery : Dr Kelly Splinter 09/13/13  General surgery Dr. Carolynne Edouard 09/10/2013  Procedures: Ct Tibia Fibula Right Wo Contrast 09/09/2013 Large subcutaneous posterior medial calf hematoma.  Dg Chest Port 1 View 09/10/2013 Status post right central line placement without pneumothorax. Crowding of the vascular markings due to a poor inspiratory effort.  Wound Vac 09/15/13 Evacuation of right leg hematoma and excisional  Debridement of the right leg 09/10/2013 per Dr. Carolynne Edouard  Antibiotics:  Levaquin 09/08/13-->09/14/13  HPI/Subjective: Patient states SOB improved. C/O pain in RLE.  Objective: Filed Vitals:   09/18/13 1304  BP: 158/45  Pulse: 61  Temp: 98.1 F (36.7 C)  Resp: 18    Intake/Output Summary (Last 24 hours) at 09/18/13 1346 Last data filed at 09/18/13 1310  Gross per 24 hour  Intake    600 ml  Output   1850 ml  Net  -1250 ml   Filed Weights   09/15/13 0618 09/16/13 0500 09/18/13 0502  Weight: 126.372 kg (278 lb 9.6 oz) 127.8 kg (281 lb 12 oz) 121.1 kg (266 lb 15.6 oz)    Exam:   General:  NAD  Cardiovascular: RRR no m/r/g  Respiratory: CTAB anterior lung fields. No wheezing.  Abdomen: Soft/Obese/NT/ND/+BS  Musculoskeletal: No c/c 1 + BLE edema. RLE with wound vac.   Data Reviewed: Basic Metabolic Panel:  Recent Labs Lab 09/14/13 0510 09/15/13 0515 09/16/13 0510 09/17/13 0540 09/18/13 0405  NA 140 141 139 142 139  K 4.6 4.2 4.1 3.9 3.9  CL 108 109 105 106 104  CO2 25 25 27 29 29   GLUCOSE 77 95 89 116* 100*  BUN 38* 39* 35* 36* 41*  CREATININE 2.46* 2.40* 2.15* 2.22* 2.52*  CALCIUM 8.4 8.4 8.5 8.5 8.3*   Liver Function Tests: No results found for this basename: AST,  ALT, ALKPHOS, BILITOT, PROT, ALBUMIN,  in the last 168 hours No results found for this basename: LIPASE, AMYLASE,  in the last 168 hours No results found for this basename: AMMONIA,  in the last 168 hours CBC:  Recent  Labs Lab 09/14/13 0510 09/15/13 0515 09/16/13 0510 09/17/13 0540 09/18/13 0405  WBC 8.4 7.7 7.6 8.8 7.1  HGB 7.9* 7.8* 7.8* 8.1* 7.9*  HCT 24.7* 24.6* 25.2* 25.4* 25.3*  MCV 89.2 89.8 88.7 88.5 88.8  PLT 225 216 263 296 297   Cardiac Enzymes: No results found for this basename: CKTOTAL, CKMB, CKMBINDEX, TROPONINI,  in the last 168 hours BNP (last 3 results)  Recent Labs  02/15/13 0434 07/05/13 1536 08/03/13 1122  PROBNP 438.6* 1581.0* 1789.0*   CBG:  Recent Labs Lab 09/17/13 1149 09/17/13 1701 09/17/13 2139 09/18/13 0759 09/18/13 1148  GLUCAP 183* 105* 145* 103* 152*    Recent Results (from the past 240 hour(s))  MRSA PCR SCREENING     Status: Abnormal   Collection Time    09/09/13  4:29 AM      Result Value Range Status   MRSA by PCR POSITIVE (*) NEGATIVE Final   Comment:            The GeneXpert MRSA Assay (FDA     approved for NASAL specimens     only), is one component of a     comprehensive MRSA colonization     surveillance program. It is not     intended to diagnose MRSA     infection nor to guide or     monitor treatment for     MRSA infections.     RESULT CALLED TO, READ BACK BY AND VERIFIED WITH:     Dorris Fetch 09/09/13 0825 BY K SCHULTZ     Performed at [facility]     Studies: No results found.  Scheduled Meds: . allopurinol  100 mg Oral Daily  . amiodarone  200 mg Oral Daily  . amLODipine  10 mg Oral Daily  . atorvastatin  20 mg Oral QHS  . feeding supplement (RESOURCE BREEZE)  1 Container Oral BID BM  . ferrous sulfate  325 mg Oral TID WC  . furosemide  40 mg Oral Daily  . hydrALAZINE  50 mg Oral TID  . insulin aspart  0-9 Units Subcutaneous TID WC  . metoprolol tartrate  12.5 mg Oral BID  . pantoprazole  40 mg Oral Daily  . polyethylene glycol  17 g Oral BID  . senna  2 tablet Oral QHS  . sodium chloride  10 mL Intravenous Q12H  . sodium chloride  3 mL Intravenous Q12H  . sodium chloride  3 mL Intravenous Q12H    Continuous Infusions:   Principal Problem:   Spontaneous hematoma of lower leg Active Problems:   HYPERTENSION   DM (diabetes mellitus)   Chronic anticoagulation   CKD (chronic kidney disease), stage IV   Anemia of chronic disease   PAF (paroxysmal atrial fibrillation)   Morbid obesity   Supratherapeutic INR   SOB (shortness of breath)    Time spent: > 35 mins    [facility]  [facility] Pager [phone]. If 7PM-7AM, please contact night-coverage at [email or web address], password [facility] 09/18/2013, 1:46 PM  LOS: 10 days

## 2013-09-19 LAB — BASIC METABOLIC PANEL
CO2: 28 mEq/L (ref 19–32)
Glucose, Bld: 104 mg/dL — ABNORMAL HIGH (ref 70–99)
Potassium: 3.8 mEq/L (ref 3.5–5.1)
Sodium: 142 mEq/L (ref 135–145)

## 2013-09-19 LAB — CBC
Hemoglobin: 8 g/dL — ABNORMAL LOW (ref 12.0–15.0)
MCH: 28 pg (ref 26.0–34.0)
MCV: 90.2 fL (ref 78.0–100.0)
RBC: 2.86 MIL/uL — ABNORMAL LOW (ref 3.87–5.11)

## 2013-09-19 LAB — GLUCOSE, CAPILLARY: Glucose-Capillary: 176 mg/dL — ABNORMAL HIGH (ref 70–99)

## 2013-09-19 MED ORDER — HYDRALAZINE HCL 50 MG PO TABS
75.0000 mg | ORAL_TABLET | Freq: Three times a day (TID) | ORAL | Status: DC
  Administered 2013-09-19 – 2013-09-22 (×9): 75 mg via ORAL
  Filled 2013-09-19 (×11): qty 1

## 2013-09-19 NOTE — Progress Notes (Signed)
[facility] PROGRESS NOTE  [name] BJY:[id number] DOB: 12-Feb-1951 DOA: 09/08/2013 PCP: Willey Blade, MD  Assessment/Plan: #1 large right posterior calf hematoma with large residual wound Status post evacuation and debridement per surgery 09/10/2013. Patient is currently off Coumadin. Patient is status post wound VAC placement per plastic surgery. Plastic surgery is following and appreciate input and recommendations. Patient will likely need outpatient followup at the wound care center.  #2 Volume overload Patient with significant shortness of breath with conversation 3 days ago. Clinical exam improved, and close to baseline. Chest x-ray was c/w volume overload. Continue oral Lasix.  #3 acute blood loss anemia Secondary to spontaneous hematoma of the lower extremity in the setting of supratherapeutic INR. Patient is status post 7 units packed red blood cells. Hemoglobin is currently stable, at 8.0. Follow.  #4 hypertension Systolic blood pressures improving. Continue Norvasc, metoprolol. Increase hydralazine to 75mg  TID. Follow.  #5 chronic anti-coagulation Coumadin on hold.  #6 diabetes mellitus Hemoglobin A1c was 5.9 and 08/18/2013. CBGs have ranged from 113-176. Continue sliding scale insulin.  #7 chronic kidney disease stage IV Patient close to baseline creatinine. Continue diuretics and follow.  #8 grade 2 diastolic and systolic dysfunction Clinical improvement. Physical exam improved. CXR c/w volume overload. Continue oral lasix.  Strict I.'s and O.'s. Daily weights.  #9 paroxysmal atrial fibrillation Amiodarone and metoprolol for rate control. Coumadin has been discontinued secondary to hematoma.  #10 morbid obesity  #11 prophylaxis PPI for GI prophylaxis. SCDs for DVT prophylaxis.  Code Status: Full Family Communication: Updated patient no family at bedside Disposition Plan: Skilled nursing facility when medically stable   Consultants:  Orthopedic  Surgery: Dr. Charlann Boxer 09/09/2013  Plastic Surgery : Dr Kelly Splinter 09/13/13  General surgery Dr. Carolynne Edouard 09/10/2013  Procedures: Ct Tibia Fibula Right Wo Contrast 09/09/2013 Large subcutaneous posterior medial calf hematoma.  Dg Chest Port 1 View 09/10/2013 Status post right central line placement without pneumothorax. Crowding of the vascular markings due to a poor inspiratory effort.  Wound Vac 09/15/13 Evacuation of right leg hematoma and excisional  Debridement of the right leg 09/10/2013 per Dr. Carolynne Edouard 7 units PRBCs  Antibiotics:  Levaquin 09/08/13-->09/14/13  HPI/Subjective: Patient states SOB improved. C/O occasion discomfort in RLE.  Objective: Filed Vitals:   09/19/13 1415  BP: 150/40  Pulse: 58  Temp: 97.6 F (36.4 C)  Resp: 20    Intake/Output Summary (Last 24 hours) at 09/19/13 1456 Last data filed at 09/19/13 0700  Gross per 24 hour  Intake    720 ml  Output    850 ml  Net   -130 ml   Filed Weights   09/16/13 0500 09/18/13 0502 09/19/13 0431  Weight: 127.8 kg (281 lb 12 oz) 121.1 kg (266 lb 15.6 oz) 123.9 kg (273 lb 2.4 oz)    Exam:   General:  NAD  Cardiovascular: RRR no m/r/g  Respiratory: CTAB anterior lung fields. No wheezing.  Abdomen: Soft/Obese/NT/ND/+BS  Musculoskeletal: No c/c trace -1 + BLE edema. RLE with wound vac.   Data Reviewed: Basic Metabolic Panel:  Recent Labs Lab 09/15/13 0515 09/16/13 0510 09/17/13 0540 09/18/13 0405 09/19/13 0515  NA 141 139 142 139 142  K 4.2 4.1 3.9 3.9 3.8  CL 109 105 106 104 106  CO2 25 27 29 29 28   GLUCOSE 95 89 116* 100* 104*  BUN 39* 35* 36* 41* 44*  CREATININE 2.40* 2.15* 2.22* 2.52* 2.09*  CALCIUM 8.4 8.5 8.5 8.3* 8.7   Liver Function Tests: No  results found for this basename: AST, ALT, ALKPHOS, BILITOT, PROT, ALBUMIN,  in the last 168 hours No results found for this basename: LIPASE, AMYLASE,  in the last 168 hours No results found for this basename: AMMONIA,  in the last 168 hours CBC:  Recent  Labs Lab 09/15/13 0515 09/16/13 0510 09/17/13 0540 09/18/13 0405 09/19/13 0515  WBC 7.7 7.6 8.8 7.1 7.0  HGB 7.8* 7.8* 8.1* 7.9* 8.0*  HCT 24.6* 25.2* 25.4* 25.3* 25.8*  MCV 89.8 88.7 88.5 88.8 90.2  PLT 216 263 296 297 343   Cardiac Enzymes: No results found for this basename: CKTOTAL, CKMB, CKMBINDEX, TROPONINI,  in the last 168 hours BNP (last 3 results)  Recent Labs  02/15/13 0434 07/05/13 1536 08/03/13 1122  PROBNP 438.6* 1581.0* 1789.0*   CBG:  Recent Labs Lab 09/18/13 1148 09/18/13 1712 09/18/13 2142 09/19/13 0730 09/19/13 1241  GLUCAP 152* 120* 162* 113* 176*    No results found for this or any previous visit (from the past 240 hour(s)).   Studies: No results found.  Scheduled Meds: . allopurinol  100 mg Oral Daily  . amiodarone  200 mg Oral Daily  . amLODipine  10 mg Oral Daily  . atorvastatin  20 mg Oral QHS  . feeding supplement (RESOURCE BREEZE)  1 Container Oral BID BM  . ferrous sulfate  325 mg Oral TID WC  . furosemide  40 mg Oral Daily  . hydrALAZINE  50 mg Oral TID  . insulin aspart  0-9 Units Subcutaneous TID WC  . metoprolol tartrate  12.5 mg Oral BID  . pantoprazole  40 mg Oral Daily  . polyethylene glycol  17 g Oral BID  . senna  2 tablet Oral QHS  . sodium chloride  10 mL Intravenous Q12H  . sodium chloride  3 mL Intravenous Q12H  . sodium chloride  3 mL Intravenous Q12H   Continuous Infusions:   Principal Problem:   Spontaneous hematoma of lower leg Active Problems:   HYPERTENSION   DM (diabetes mellitus)   Chronic anticoagulation   CKD (chronic kidney disease), stage IV   Anemia of chronic disease   PAF (paroxysmal atrial fibrillation)   Morbid obesity   Supratherapeutic INR   SOB (shortness of breath)    Time spent: > 35 mins    [facility]  [facility] Pager [phone]. If 7PM-7AM, please contact night-coverage at [email or web address], password [facility] 09/19/2013, 2:56 PM  LOS: 11 days

## 2013-09-19 NOTE — Progress Notes (Signed)
Pt refused to get out of bed. RN encouraged pt multiple times on the importance of ambulation. Pt stated several times that she will get up later and when RN returns pt continues to refuse. Multiple RN's have encouraged pt to try to get out of bed and sit in the chair. MD spoke with pt regarding ambulation and the need for her to sit in the chair today. MD shared with RN this goal. When RN reminded pt of the goal pt denies MD wanting her to get up today. Pt stated "in his dreams, he said I could just get up tomorrow". Pt verbalizes understanding of the significance associated with ambulation.  Chesley Mires R

## 2013-09-20 LAB — BASIC METABOLIC PANEL
BUN: 38 mg/dL — ABNORMAL HIGH (ref 6–23)
CO2: 29 mEq/L (ref 19–32)
Calcium: 8.6 mg/dL (ref 8.4–10.5)
Chloride: 106 mEq/L (ref 96–112)
GFR calc Af Amer: 33 mL/min — ABNORMAL LOW (ref >90)
Glucose, Bld: 119 mg/dL — ABNORMAL HIGH (ref 70–99)
Potassium: 3.9 mEq/L (ref 3.5–5.1)

## 2013-09-20 LAB — CBC
HCT: 26.7 % — ABNORMAL LOW (ref 36.0–46.0)
Hemoglobin: 8.3 g/dL — ABNORMAL LOW (ref 12.0–15.0)
RBC: 2.98 MIL/uL — ABNORMAL LOW (ref 3.87–5.11)
WBC: 8.2 10*3/uL (ref 4.0–10.5)

## 2013-09-20 LAB — GLUCOSE, CAPILLARY
Glucose-Capillary: 115 mg/dL — ABNORMAL HIGH (ref 70–99)
Glucose-Capillary: 139 mg/dL — ABNORMAL HIGH (ref 70–99)

## 2013-09-20 NOTE — Progress Notes (Signed)
CSW submitted for [place]. Notified [facility].  Golda Zavalza C. Norma Montemurro MSW, LCSW [phone]

## 2013-09-20 NOTE — Progress Notes (Signed)
Clinical Social Work  CSW spoke with MD who reports patient is not ready to DC today and reports possible DC tomorrow. CSW spoke with Cicero Duck from [facility] who reports authorization has been granted and is valid for 48 hours. If patient does not DC within 48 hours then authorization will have to be resubmitted. CSW spoke with SNF who has authorization and agreeable to accept whenever medically stable.  Unk Lightning, LCSW (Coverage for [employer])

## 2013-09-20 NOTE — Plan of Care (Signed)
Problem: Phase I Progression Outcomes Goal: OOB as tolerated unless otherwise ordered Outcome: Not Progressing Pt refuses to ambulate. Pt educated on importance of ambulation, verbalizes understanding.

## 2013-09-20 NOTE — Progress Notes (Signed)
[facility] PROGRESS NOTE  [name] NUU:[id number] DOB: [birth year] DOA: 09/08/2013 PCP: Willey Blade, MD  Assessment/Plan: #1 large right posterior calf hematoma with large residual wound Status post evacuation and debridement per surgery 09/10/2013. Patient is currently off Coumadin. Patient is status post wound VAC placement per plastic surgery. Plastic surgery is following and appreciate input and recommendations. Patient will likely need outpatient followup at the wound care center.  #2 Volume overload Patient with significant shortness of breath with conversation 3 days ago. Clinical exam improved, and close to baseline. Chest x-ray was c/w volume overload. Continue oral Lasix.  #3 acute blood loss anemia Secondary to spontaneous hematoma of the lower extremity in the setting of supratherapeutic INR. Patient is status post 7 units packed red blood cells. Hemoglobin is currently stable, at 8.0. Follow.  #4 hypertension Systolic blood pressures improving. Continue Norvasc, metoprolol. Continue hydralazine to 75mg  TID. Follow.  #5 chronic anti-coagulation Coumadin on hold.  #6 diabetes mellitus Hemoglobin A1c was 5.9 and 08/18/2013. CBGs have ranged from 113-176. Continue sliding scale insulin.  #7 chronic kidney disease stage IV Patient close to baseline creatinine. Continue diuretics and follow.  #8 grade 2 diastolic and systolic dysfunction Clinical improvement. Physical exam improved. Euvolemic. CXR c/w volume overload. Continue oral lasix.  Strict I.'s and O.'s. Daily weights.  #9 paroxysmal atrial fibrillation Amiodarone and metoprolol for rate control. Coumadin has been discontinued secondary to hematoma.  #10 morbid obesity  #11 prophylaxis PPI for GI prophylaxis. SCDs for DVT prophylaxis.  Code Status: Full Family Communication: Updated patient no family at bedside Disposition Plan: Skilled nursing facility when medically  stable   Consultants:  Orthopedic Surgery: Dr. Charlann Boxer 09/09/2013  Plastic Surgery : Dr Kelly Splinter 09/13/13  General surgery Dr. Carolynne Edouard 09/10/2013  Procedures: Ct Tibia Fibula Right Wo Contrast 09/09/2013 Large subcutaneous posterior medial calf hematoma.  Dg Chest Port 1 View 09/10/2013 Status post right central line placement without pneumothorax. Crowding of the vascular markings due to a poor inspiratory effort.  Wound Vac 09/15/13 Evacuation of right leg hematoma and excisional  Debridement of the right leg 09/10/2013 per Dr. Carolynne Edouard 7 units PRBCs  Antibiotics:  Levaquin 09/08/13-->09/14/13  HPI/Subjective: Patient states SOB improved. C/O occasion discomfort in RLE.  Objective: Filed Vitals:   09/20/13 1103  BP: 150/62  Pulse: 64  Temp:   Resp:     Intake/Output Summary (Last 24 hours) at 09/20/13 1205 Last data filed at 09/20/13 0650  Gross per 24 hour  Intake 958.83 ml  Output   1400 ml  Net -441.17 ml   Filed Weights   09/16/13 0500 09/18/13 0502 09/19/13 0431  Weight: 127.8 kg (281 lb 12 oz) 121.1 kg (266 lb 15.6 oz) 123.9 kg (273 lb 2.4 oz)    Exam:   General:  NAD  Cardiovascular: RRR no m/r/g  Respiratory: CTAB anterior lung fields. No wheezing.  Abdomen: Soft/Obese/NT/ND/+BS  Musculoskeletal: No c/c trace -1 + BLE edema. RLE with wound vac.   Data Reviewed: Basic Metabolic Panel:  Recent Labs Lab 09/16/13 0510 09/17/13 0540 09/18/13 0405 09/19/13 0515 09/20/13 0535  NA 139 142 139 142 140  K 4.1 3.9 3.9 3.8 3.9  CL 105 106 104 106 106  CO2 27 29 29 28 29   GLUCOSE 89 116* 100* 104* 119*  BUN 35* 36* 41* 44* 38*  CREATININE 2.15* 2.22* 2.52* 2.09* 1.83*  CALCIUM 8.5 8.5 8.3* 8.7 8.6   Liver Function Tests: No results found for this basename: AST, ALT, ALKPHOS,  BILITOT, PROT, ALBUMIN,  in the last 168 hours No results found for this basename: LIPASE, AMYLASE,  in the last 168 hours No results found for this basename: AMMONIA,  in the last 168  hours CBC:  Recent Labs Lab 09/16/13 0510 09/17/13 0540 09/18/13 0405 09/19/13 0515 09/20/13 0535  WBC 7.6 8.8 7.1 7.0 8.2  HGB 7.8* 8.1* 7.9* 8.0* 8.3*  HCT 25.2* 25.4* 25.3* 25.8* 26.7*  MCV 88.7 88.5 88.8 90.2 89.6  PLT 263 296 297 343 371   Cardiac Enzymes: No results found for this basename: CKTOTAL, CKMB, CKMBINDEX, TROPONINI,  in the last 168 hours BNP (last 3 results)  Recent Labs  02/15/13 0434 07/05/13 1536 08/03/13 1122  PROBNP 438.6* 1581.0* 1789.0*   CBG:  Recent Labs Lab 09/18/13 2142 09/19/13 0730 09/19/13 1241 09/19/13 1734 09/20/13 0730  GLUCAP 162* 113* 176* 154* 127*    No results found for this or any previous visit (from the past 240 hour(s)).   Studies: No results found.  Scheduled Meds: . allopurinol  100 mg Oral Daily  . amiodarone  200 mg Oral Daily  . amLODipine  10 mg Oral Daily  . atorvastatin  20 mg Oral QHS  . feeding supplement (RESOURCE BREEZE)  1 Container Oral BID BM  . ferrous sulfate  325 mg Oral TID WC  . furosemide  40 mg Oral Daily  . hydrALAZINE  75 mg Oral TID  . insulin aspart  0-9 Units Subcutaneous TID WC  . metoprolol tartrate  12.5 mg Oral BID  . pantoprazole  40 mg Oral Daily  . polyethylene glycol  17 g Oral BID  . senna  2 tablet Oral QHS  . sodium chloride  10 mL Intravenous Q12H  . sodium chloride  3 mL Intravenous Q12H  . sodium chloride  3 mL Intravenous Q12H   Continuous Infusions:   Principal Problem:   Spontaneous hematoma of lower leg Active Problems:   HYPERTENSION   DM (diabetes mellitus)   Chronic anticoagulation   CKD (chronic kidney disease), stage IV   Anemia of chronic disease   PAF (paroxysmal atrial fibrillation)   Morbid obesity   Supratherapeutic INR   SOB (shortness of breath)    Time spent: > 35 mins    [facility]  [facility] Pager [phone]. If 7PM-7AM, please contact night-coverage at [email or web address], password [facility] 09/20/2013, 12:05 PM  LOS: 12 days

## 2013-09-20 NOTE — Progress Notes (Signed)
Physical Therapy Treatment Patient Details Name: [name] MRN: [id number] DOB: 07/16/1951 Today's Date: 09/20/2013 Time: 9528-4132 PT Time Calculation (min): 27 min  PT Assessment / Plan / Recommendation  History of Present Illness pt very nauseous this am.  she did have multiple episodes of retching and vomiting of brown liquid x 1.  She still agreed to get up to a chair this morning  Pt with dark red blood drainage from sup and prox edge of wound vac dressing. RN aware   PT Comments   Pt was sick this am, but she was able to move in the bed, transfer to chair and stand from chair for 30 sec with min assist.  All equipment was set up to provide a clear path for pt success in mobility.  Pt with episode of vomiting. Improved participation today  Follow Up Recommendations  SNF     Does the patient have the potential to tolerate intense rehabilitation     Barriers to Discharge        Equipment Recommendations       Recommendations for Other Services    Frequency     Progress towards PT Goals Progress towards PT goals: Progressing toward goals  Plan Current plan remains appropriate    Precautions / Restrictions Precautions Precautions: Fall Restrictions Weight Bearing Restrictions: No   Pertinent Vitals/Pain Pain in legs, but bigger limitation today was nausea.  O2 in place    Mobility  Bed Mobility Bed Mobility: Supine to Sit;Sitting - Scoot to Edge of Bed Supine to Sit: 4: Min assist;HOB elevated Sitting - Scoot to [place] of Bed: 5: Supervision Details for Bed Mobility Assistance: Pt able to initated moving to edge of bed and benefitted from having hed of bed raised and equipment positioned for max pt success Transfers Transfers: Sit to Stand;Stand to Sit Sit to Stand: 4: Min guard Stand to Sit: 4: Min guard Stand Pivot Transfers: 4: Min guard Details for Transfer Assistance: Chair postioned on pt left side so she could keep all lines and tubes in front of her and  pivot toward her strong side. Pt needed extra time for all activity and clearly was uncomfortable with moving, but she was able to move body Ambulation/Gait Ambulation/Gait Assistance: 4: Min guard Ambulation Distance (Feet): 2 Feet Assistive device: Rolling walker Ambulation/Gait Assistance Details: needs to have equipment set up so it is easiest for pt to complete task Gait Pattern: Step-to pattern;Decreased step length - right;Decreased step length - left Gait velocity: decreased General Gait Details: pt did stand pivot to chair, and then another stand for 30 sec after vomiting. Stairs: No Wheelchair Mobility Wheelchair Mobility: No    Exercises General Exercises - Lower Extremity Ankle Circles/Pumps: AROM;10 reps;Supine   PT Diagnosis:    PT Problem List:   PT Treatment Interventions:     PT Goals (current goals can now be found in the care plan section)    Visit Information  Last PT Received On: 09/20/13 Assistance Needed: +1 History of Present Illness: pt very nauseous this am.  she did have multiple episodes of retching and vomiting of brown liquid x 1.  She still agreed to get up to a chair this morning  Pt with dark red blood drainage from sup and prox edge of wound vac dressing. RN aware    Subjective Data      Cognition  Cognition Arousal/Alertness: Awake/alert Behavior During Therapy: WFL for tasks assessed/performed Overall Cognitive Status: Within Functional Limits for tasks assessed  General Comments: Pt uncomfortable this am from leg pain and nausea, but she cooperated with PT    Balance  Static Sitting Balance Static Sitting - Balance Support: Bilateral upper extremity supported;Feet supported Static Sitting - Level of Assistance: 5: Stand by assistance ( difficulty finding balance point due to short stature) Static Standing Balance Static Standing - Balance Support: Bilateral upper extremity supported;During functional activity Static Standing - Level of  Assistance: 5: Stand by assistance (stood for 30 sec)  End of Session PT - End of Session Activity Tolerance: Patient limited by pain;Patient limited by fatigue;Other (comment) (limited by nausea) Patient left: in chair;with call bell/phone within reach Nurse Communication: Mobility status   GP    Bayard Hugger. Manson Passey, PT [phone] 09/20/2013, 9:35 AM

## 2013-09-20 NOTE — Progress Notes (Signed)
Notified PA Shawn Rayburn of pts wound vac dressing leaking. RN instructed to reinforce dressing with tegaderm or apply wet to dry dressing if tegaderm did not stop the leak. Applied tegaderm and leaking stopped. PA gave RN cell phone number if any additional assistance was needed. Will continue to monitor closely.  Chesley Mires R

## 2013-09-21 LAB — BASIC METABOLIC PANEL
BUN: 39 mg/dL — ABNORMAL HIGH (ref 6–23)
CO2: 28 mEq/L (ref 19–32)
Calcium: 9 mg/dL (ref 8.4–10.5)
GFR calc non Af Amer: 28 mL/min — ABNORMAL LOW (ref >90)
Glucose, Bld: 100 mg/dL — ABNORMAL HIGH (ref 70–99)
Potassium: 3.8 mEq/L (ref 3.5–5.1)
Sodium: 141 mEq/L (ref 135–145)

## 2013-09-21 LAB — GLUCOSE, CAPILLARY
Glucose-Capillary: 115 mg/dL — ABNORMAL HIGH (ref 70–99)
Glucose-Capillary: 128 mg/dL — ABNORMAL HIGH (ref 70–99)
Glucose-Capillary: 135 mg/dL — ABNORMAL HIGH (ref 70–99)

## 2013-09-21 LAB — CBC
Hemoglobin: 8.1 g/dL — ABNORMAL LOW (ref 12.0–15.0)
MCH: 27.8 pg (ref 26.0–34.0)
MCHC: 31 g/dL (ref 30.0–36.0)
MCV: 89.7 fL (ref 78.0–100.0)
RDW: 16.7 % — ABNORMAL HIGH (ref 11.5–15.5)

## 2013-09-21 MED ORDER — SODIUM CHLORIDE 0.9 % IV SOLN
INTRAVENOUS | Status: DC
  Administered 2013-09-21 (×2): via INTRAVENOUS

## 2013-09-21 MED ORDER — ADULT MULTIVITAMIN W/MINERALS CH
1.0000 | ORAL_TABLET | Freq: Every day | ORAL | Status: DC
  Administered 2013-09-21 – 2013-09-22 (×2): 1 via ORAL
  Filled 2013-09-21 (×3): qty 1

## 2013-09-21 NOTE — Progress Notes (Signed)
Clinical Social Work  Per chart review, patient not medically stable to DC and going to OR on 09/22/13. CSW spoke with SNF who reports available beds but would need to ensure [facility] authorization prior to accepting patient. CSW will continue to follow.  Unk Lightning, LCSW (Coverage for [employer])

## 2013-09-21 NOTE — Progress Notes (Signed)
NUTRITION FOLLOW-UP  DOCUMENTATION CODES Per approved criteria  -Morbid Obesity   INTERVENTION: Continue Resource Breeze po BID, each supplement provides 250 kcal and 9 grams of protein.  Encourage PO intake >50% of meals Add Multivitamin with minerals daily RD to continue to follow nutrition care plan.  NUTRITION DIAGNOSIS: Inadequate oral intake r/t poor appetite AEB pt report; ongoing  Goal: PO intake to meet >/=90% estimated nutrition needs; not met  Monitor:  PO intake, supplement tolerance, weight trends, labs   ASSESSMENT: PMHx significant for coumadin use for afib, CKD IV, DM (most recent A1c 5.9.) Admitted with spontaneous hematoma to R calf. Recent admission to [facility] for BRBPR.   Underwent the following on 10/3: EVACUATION OF RIGHT LEG HEMATOMA AND EXCISION DEBRIDIMENT RIGHT LEG  Per chart, pt is scheduled for additional debridment in OR tomorrow morning. Plan for pt to go to SNF afterward for temporary wound care. Pt reports poor appetite. 10/10 to 10/11 pt was eating 75-100% of all meals but, she has only been eating 25-50% of meals the past 2 days. Pt states she is drinking 1/2 box of [employer] twice daily. She is not interested in any other supplements. Encourage pt to eat at least 50-75% of 3 meals daily.    Height: Ht Readings from Last 1 Encounters:  09/09/13 5\' 3"  (1.6 m)    Weight: Wt Readings from Last 1 Encounters:  09/21/13 271 lb 9.7 oz (123.2 kg)  Admit wt 280 lb  BMI:  Body mass index is 48.13 kg/(m^2). Obesity class 3, extreme  Estimated Nutritional Needs: Kcal: 2000-2200 Protein: 85-100 gm  Fluid: 2-2.2 L   Skin:  R leg incision Fissure on buttocks  Diet Order: Carb Control Medium  EDUCATION NEEDS: -No education needs identified at this time   Intake/Output Summary (Last 24 hours) at 09/21/13 1606 Last data filed at 09/21/13 1418  Gross per 24 hour  Intake    800 ml  Output   1676 ml  Net   -876 ml    Last BM:  10/11  Labs:   Recent Labs Lab 09/19/13 0515 09/20/13 0535 09/21/13 0430  NA 142 140 141  K 3.8 3.9 3.8  CL 106 106 106  CO2 28 29 28   BUN 44* 38* 39*  CREATININE 2.09* 1.83* 1.82*  CALCIUM 8.7 8.6 9.0  GLUCOSE 104* 119* 100*    CBG (last 3)   Recent Labs  09/20/13 2046 09/21/13 0745 09/21/13 1200  GLUCAP 124* 115* 128*    Scheduled Meds: . allopurinol  100 mg Oral Daily  . amiodarone  200 mg Oral Daily  . amLODipine  10 mg Oral Daily  . atorvastatin  20 mg Oral QHS  . feeding supplement (RESOURCE BREEZE)  1 Container Oral BID BM  . ferrous sulfate  325 mg Oral TID WC  . furosemide  40 mg Oral Daily  . hydrALAZINE  75 mg Oral TID  . insulin aspart  0-9 Units Subcutaneous TID WC  . metoprolol tartrate  12.5 mg Oral BID  . pantoprazole  40 mg Oral Daily  . polyethylene glycol  17 g Oral BID  . senna  2 tablet Oral QHS  . sodium chloride  10 mL Intravenous Q12H    Continuous Infusions: . [START ON 09/22/2013] sodium chloride      Ian Malkin RD, LDN Inpatient Clinical Dietitian Pager: [phone] After Hours Pager: [phone]

## 2013-09-21 NOTE — Progress Notes (Signed)
Hx spontaneous hematoma calf on anticoagulation post debridement. Post A Cell application and VAC on 09/15/13  Disucussed with CM and Dr. Janee Morn that Plastics team felt additional debridement would be beneficial at last procedure. Unable to do this at time at bedside. We have scheduled pt for debridement in OR tomorrow am tenatively 11 am. Plan additional A Cell application and VAC. Confirmed with CM that pt will go to SNF with VAC.   Discussed with pt plan, hold SNF transfer until this is complete. Discussed that SNF is temporary but will be helpful for wound care, VAC, and returning to full ambulation.  Some issues with VAC yesterday but appears to be working well this am.  Labs reviewed: anemic at 8.1 Hb, Cr 1.82  PE Alert oriented Clear Regular  BLE with edema, RLE with VAC in place, some bloody drainage beneath dressing but no cellulitis Calf soft   A/P Anticoagulation held presently, plan OR in am. May transfer to SNF anytime thereafter. Will need first VAC change in appr 5 days post procedure. Can do this in SNF or may f/u our office ([phone]) or Wound Center.  Pt asking about skin grafts, discussed this is possible but not at tomorrow procedure, will reassess as wound progresses.

## 2013-09-21 NOTE — Progress Notes (Signed)
[facility] PROGRESS NOTE  [name] WJX:[id number] DOB: 1951/11/09 DOA: 09/08/2013 PCP: Willey Blade, MD  Assessment/Plan: #1 large right posterior calf hematoma with large residual wound Status post evacuation and debridement per surgery 09/10/2013. Patient is currently off Coumadin. Patient is status post wound VAC placement per plastic surgery. Patient to the operating room in the morning 40 [place] and further management a wound VAC per plastics. Plastic surgery is following and appreciate input and recommendations. Patient will likely need outpatient followup at the wound care center.  #2 Volume overload Patient with significant shortness of breath with conversation 4 days ago. Clinical exam improved, and close to baseline. Chest x-ray was c/w volume overload. Continue oral Lasix.  #3 acute blood loss anemia Secondary to spontaneous hematoma of the lower extremity in the setting of supratherapeutic INR. Patient is status post 7 units packed red blood cells. Hemoglobin is currently stable, at 8.1. Follow.  #4 hypertension Systolic blood pressures improving. Continue Norvasc, metoprolol, hydralazine.   #5 chronic anti-coagulation Coumadin on hold.  #6 diabetes mellitus Hemoglobin A1c was 5.9 and 08/18/2013. CBGs have ranged from 115-135. Continue sliding scale insulin.  #7 chronic kidney disease stage IV Patient close to baseline creatinine. Continue diuretics and follow.  #8 grade 2 diastolic and systolic dysfunction Clinical improvement. Physical exam improved. Euvolemic. CXR c/w volume overload. Continue oral lasix.  Strict I.'s and O.'s. Daily weights.  #9 paroxysmal atrial fibrillation Amiodarone and metoprolol for rate control. Coumadin has been discontinued secondary to hematoma.  #10 morbid obesity  #11 prophylaxis PPI for GI prophylaxis. SCDs for DVT prophylaxis.  Code Status: Full Family Communication: Updated patient no family at bedside Disposition  Plan: Skilled nursing facility when medically stable   Consultants:  Orthopedic Surgery: Dr. Charlann Boxer 09/09/2013  Plastic Surgery : Dr Kelly Splinter 09/13/13  General surgery Dr. Carolynne Edouard 09/10/2013  Procedures: Ct Tibia Fibula Right Wo Contrast 09/09/2013 Large subcutaneous posterior medial calf hematoma.  Dg Chest Port 1 View 09/10/2013 Status post right central line placement without pneumothorax. Crowding of the vascular markings due to a poor inspiratory effort.  Wound Vac 09/15/13 Evacuation of right leg hematoma and excisional  Debridement of the right leg 09/10/2013 per Dr. Carolynne Edouard 7 units PRBCs  Antibiotics:  Levaquin 09/08/13-->09/14/13  HPI/Subjective: Patient states SOB improved. No complaints. Hair being braided by her granddaughter.  Objective: Filed Vitals:   09/21/13 0548  BP: 158/50  Pulse: 60  Temp: 98.2 F (36.8 C)  Resp: 18    Intake/Output Summary (Last 24 hours) at 09/21/13 1101 Last data filed at 09/21/13 0925  Gross per 24 hour  Intake    840 ml  Output   1351 ml  Net   -511 ml   Filed Weights   09/18/13 0502 09/19/13 0431 09/21/13 0548  Weight: 121.1 kg (266 lb 15.6 oz) 123.9 kg (273 lb 2.4 oz) 123.2 kg (271 lb 9.7 oz)    Exam:   General:  NAD  Cardiovascular: RRR no m/r/g  Respiratory: CTAB anterior lung fields. No wheezing.  Abdomen: Soft/Obese/NT/ND/+BS  Musculoskeletal: No c/c trace -1 + BLE edema. RLE with wound vac.   Data Reviewed: Basic Metabolic Panel:  Recent Labs Lab 09/17/13 0540 09/18/13 0405 09/19/13 0515 09/20/13 0535 09/21/13 0430  NA 142 139 142 140 141  K 3.9 3.9 3.8 3.9 3.8  CL 106 104 106 106 106  CO2 29 29 28 29 28   GLUCOSE 116* 100* 104* 119* 100*  BUN 36* 41* 44* 38* 39*  CREATININE 2.22*  2.52* 2.09* 1.83* 1.82*  CALCIUM 8.5 8.3* 8.7 8.6 9.0   Liver Function Tests: No results found for this basename: AST, ALT, ALKPHOS, BILITOT, PROT, ALBUMIN,  in the last 168 hours No results found for this basename: LIPASE,  AMYLASE,  in the last 168 hours No results found for this basename: AMMONIA,  in the last 168 hours CBC:  Recent Labs Lab 09/17/13 0540 09/18/13 0405 09/19/13 0515 09/20/13 0535 09/21/13 0430  WBC 8.8 7.1 7.0 8.2 7.6  HGB 8.1* 7.9* 8.0* 8.3* 8.1*  HCT 25.4* 25.3* 25.8* 26.7* 26.1*  MCV 88.5 88.8 90.2 89.6 89.7  PLT 296 297 343 371 369   Cardiac Enzymes: No results found for this basename: CKTOTAL, CKMB, CKMBINDEX, TROPONINI,  in the last 168 hours BNP (last 3 results)  Recent Labs  02/15/13 0434 07/05/13 1536 08/03/13 1122  PROBNP 438.6* 1581.0* 1789.0*   CBG:  Recent Labs Lab 09/19/13 2216 09/20/13 0730 09/20/13 1239 09/20/13 2046 09/21/13 0745  GLUCAP 131* 127* 139* 124* 115*    No results found for this or any previous visit (from the past 240 hour(s)).   Studies: No results found.  Scheduled Meds: . allopurinol  100 mg Oral Daily  . amiodarone  200 mg Oral Daily  . amLODipine  10 mg Oral Daily  . atorvastatin  20 mg Oral QHS  . feeding supplement (RESOURCE BREEZE)  1 Container Oral BID BM  . ferrous sulfate  325 mg Oral TID WC  . furosemide  40 mg Oral Daily  . hydrALAZINE  75 mg Oral TID  . insulin aspart  0-9 Units Subcutaneous TID WC  . metoprolol tartrate  12.5 mg Oral BID  . pantoprazole  40 mg Oral Daily  . polyethylene glycol  17 g Oral BID  . senna  2 tablet Oral QHS  . sodium chloride  10 mL Intravenous Q12H   Continuous Infusions: . [START ON 09/22/2013] sodium chloride      Principal Problem:   Spontaneous hematoma of lower leg Active Problems:   HYPERTENSION   DM (diabetes mellitus)   Chronic anticoagulation   CKD (chronic kidney disease), stage IV   Anemia of chronic disease   PAF (paroxysmal atrial fibrillation)   Morbid obesity   Supratherapeutic INR   SOB (shortness of breath)    Time spent: > 35 mins    [facility]  [facility] Pager [phone]. If 7PM-7AM, please contact night-coverage at  [email or web address], password [facility] 09/21/2013, 11:01 AM  LOS: 13 days

## 2013-09-22 ENCOUNTER — Encounter (HOSPITAL_COMMUNITY): Payer: Medicare HMO | Admitting: Anesthesiology

## 2013-09-22 ENCOUNTER — Encounter (HOSPITAL_COMMUNITY): Payer: Self-pay

## 2013-09-22 ENCOUNTER — Inpatient Hospital Stay (HOSPITAL_COMMUNITY): Payer: Medicare HMO | Admitting: Anesthesiology

## 2013-09-22 ENCOUNTER — Encounter (HOSPITAL_COMMUNITY)
Admission: EM | Disposition: A | Discharge status: Skilled Nursing Facility | Payer: Self-pay | Source: Home / Self Care | Attending: Internal Medicine

## 2013-09-22 HISTORY — PX: DEBRIDEMENT AND CLOSURE WOUND: SHX5614

## 2013-09-22 LAB — BASIC METABOLIC PANEL
BUN: 33 mg/dL — ABNORMAL HIGH (ref 6–23)
CO2: 28 mEq/L (ref 19–32)
Chloride: 108 mEq/L (ref 96–112)
Glucose, Bld: 111 mg/dL — ABNORMAL HIGH (ref 70–99)
Potassium: 3.7 mEq/L (ref 3.5–5.1)
Sodium: 142 mEq/L (ref 135–145)

## 2013-09-22 LAB — CBC
HCT: 26.1 % — ABNORMAL LOW (ref 36.0–46.0)
Hemoglobin: 8.1 g/dL — ABNORMAL LOW (ref 12.0–15.0)
MCH: 27.6 pg (ref 26.0–34.0)
MCHC: 31 g/dL (ref 30.0–36.0)
MCV: 88.8 fL (ref 78.0–100.0)
Platelets: 341 10*3/uL (ref 150–400)
RBC: 2.94 MIL/uL — ABNORMAL LOW (ref 3.87–5.11)

## 2013-09-22 LAB — GLUCOSE, CAPILLARY
Glucose-Capillary: 110 mg/dL — ABNORMAL HIGH (ref 70–99)
Glucose-Capillary: 112 mg/dL — ABNORMAL HIGH (ref 70–99)

## 2013-09-22 SURGERY — DEBRIDEMENT, WOUND, WITH CLOSURE
Anesthesia: General | Site: Leg Lower | Laterality: Right | Wound class: Dirty or Infected

## 2013-09-22 MED ORDER — PROMETHAZINE HCL 25 MG/ML IJ SOLN: 6.2500 mg | INTRAMUSCULAR | Status: DC | PRN

## 2013-09-22 MED ORDER — FENTANYL CITRATE 0.05 MG/ML IJ SOLN
25.0000 ug | INTRAMUSCULAR | Status: DC | PRN
  Administered 2013-09-22 (×4): 25 ug via INTRAVENOUS

## 2013-09-22 MED ORDER — SODIUM CHLORIDE 0.9 % IV SOLN
INTRAVENOUS | Status: DC
  Administered 2013-09-22: 1000 mL via INTRAVENOUS

## 2013-09-22 MED ORDER — LIDOCAINE HCL (CARDIAC) 20 MG/ML IV SOLN
INTRAVENOUS | Status: DC | PRN
  Administered 2013-09-22: 50 mg via INTRAVENOUS

## 2013-09-22 MED ORDER — BOOST / RESOURCE BREEZE PO LIQD: 1.0000 | Freq: Two times a day (BID) | ORAL | Status: DC

## 2013-09-22 MED ORDER — FENTANYL CITRATE 0.05 MG/ML IJ SOLN
INTRAMUSCULAR | Status: DC | PRN
  Administered 2013-09-22 (×2): 50 ug via INTRAVENOUS

## 2013-09-22 MED ORDER — FENTANYL CITRATE 0.05 MG/ML IJ SOLN
INTRAMUSCULAR | Status: AC
  Filled 2013-09-22: qty 2

## 2013-09-22 MED ORDER — LACTATED RINGERS IV SOLN: INTRAVENOUS | Status: DC

## 2013-09-22 MED ORDER — METOPROLOL TARTRATE 25 MG PO TABS: 12.5000 mg | ORAL_TABLET | Freq: Two times a day (BID) | ORAL | Status: DC

## 2013-09-22 MED ORDER — OXYCODONE-ACETAMINOPHEN 5-325 MG PO TABS: 1.0000 | ORAL_TABLET | ORAL | Status: DC | PRN

## 2013-09-22 MED ORDER — PROPOFOL 10 MG/ML IV BOLUS
INTRAVENOUS | Status: DC | PRN
  Administered 2013-09-22: 160 mg via INTRAVENOUS

## 2013-09-22 SURGICAL SUPPLY — 48 items
ADH SKN CLS APL DERMABOND .7 (GAUZE/BANDAGES/DRESSINGS)
APL SKNCLS STERI-STRIP NONHPOA (GAUZE/BANDAGES/DRESSINGS) ×1
BAG SPEC THK2 15X12 ZIP CLS (MISCELLANEOUS) ×1
BAG ZIPLOCK 12X15 (MISCELLANEOUS) ×2 IMPLANT
BANDAGE ELASTIC 6 VELCRO ST LF (GAUZE/BANDAGES/DRESSINGS) ×1 IMPLANT
BANDAGE ESMARK 6X9 LF (GAUZE/BANDAGES/DRESSINGS) ×1 IMPLANT
BENZOIN TINCTURE PRP APPL 2/3 (GAUZE/BANDAGES/DRESSINGS) ×1 IMPLANT
BNDG CMPR 9X6 STRL LF SNTH (GAUZE/BANDAGES/DRESSINGS) ×1
BNDG ESMARK 6X9 LF (GAUZE/BANDAGES/DRESSINGS) ×2
CLOTH BEACON ORANGE TIMEOUT ST (SAFETY) ×2 IMPLANT
CUFF TOURN SGL QUICK 34 (TOURNIQUET CUFF)
CUFF TRNQT CYL 34X4X40X1 (TOURNIQUET CUFF) ×1 IMPLANT
DERMABOND ADVANCED (GAUZE/BANDAGES/DRESSINGS)
DERMABOND ADVANCED .7 DNX12 (GAUZE/BANDAGES/DRESSINGS) ×1 IMPLANT
DRAPE EXTREMITY T 121X128X90 (DRAPE) ×2 IMPLANT
DRSG ADAPTIC 3X8 NADH LF (GAUZE/BANDAGES/DRESSINGS) ×2 IMPLANT
DRSG AQUACEL AG ADV 3.5X10 (GAUZE/BANDAGES/DRESSINGS) ×1 IMPLANT
DRSG EMULSION OIL 3X16 NADH (GAUZE/BANDAGES/DRESSINGS) ×1 IMPLANT
DRSG PAD ABDOMINAL 8X10 ST (GAUZE/BANDAGES/DRESSINGS) ×2 IMPLANT
DRSG TEGADERM 4X4.75 (GAUZE/BANDAGES/DRESSINGS) ×1 IMPLANT
DRSG VAC ATS LRG SENSATRAC (GAUZE/BANDAGES/DRESSINGS) ×1 IMPLANT
DURAPREP 26ML APPLICATOR (WOUND CARE) ×2 IMPLANT
ELECT REM PT RETURN 9FT ADLT (ELECTROSURGICAL) ×2
ELECTRODE REM PT RTRN 9FT ADLT (ELECTROSURGICAL) ×1 IMPLANT
GLOVE BIOGEL PI IND STRL 7.5 (GLOVE) ×1 IMPLANT
GLOVE BIOGEL PI IND STRL 8 (GLOVE) ×1 IMPLANT
GLOVE BIOGEL PI INDICATOR 7.5 (GLOVE)
GLOVE BIOGEL PI INDICATOR 8 (GLOVE)
GLOVE ECLIPSE 8.0 STRL XLNG CF (GLOVE) IMPLANT
GLOVE ORTHO TXT STRL SZ7.5 (GLOVE) ×2 IMPLANT
GLOVE SURG ORTHO 8.0 STRL STRW (GLOVE) ×1 IMPLANT
HANDPIECE INTERPULSE COAX TIP (DISPOSABLE)
KIT BASIN OR (CUSTOM PROCEDURE TRAY) ×2 IMPLANT
MANIFOLD NEPTUNE II (INSTRUMENTS) ×2 IMPLANT
MATRIX SURGICAL PSM 10X15CM (Tissue) ×2 IMPLANT
MICROMATRIX 1000MG (Tissue) ×2 IMPLANT
PACK TOTAL JOINT (CUSTOM PROCEDURE TRAY) ×2 IMPLANT
PADDING CAST COTTON 6X4 STRL (CAST SUPPLIES) ×1 IMPLANT
POSITIONER SURGICAL ARM (MISCELLANEOUS) ×2 IMPLANT
SET HNDPC FAN SPRY TIP SCT (DISPOSABLE) ×1 IMPLANT
SOLUTION PARTIC MCRMTRX 1000MG (Tissue) IMPLANT
SPONGE GAUZE 4X4 12PLY (GAUZE/BANDAGES/DRESSINGS) ×2 IMPLANT
STAPLER VISISTAT 35W (STAPLE) ×3 IMPLANT
SUT CHROMIC 4 0 PS 2 18 (SUTURE) ×3 IMPLANT
SUT VIC AB 1 CT1 36 (SUTURE) ×2 IMPLANT
SUT VIC AB 3-0 PS2 18 (SUTURE) ×2
SUT VIC AB 3-0 PS2 18XBRD (SUTURE) IMPLANT
TOWEL OR 17X26 10 PK STRL BLUE (TOWEL DISPOSABLE) ×4 IMPLANT

## 2013-09-22 NOTE — Transfer of Care (Signed)
Immediate Anesthesia Transfer of Care Note  Patient: [name]  Procedure(s) Performed: Procedure(s): IRRIGATION AND DEBRIDEMENT SURGICAL PREP PLACEMENT OF ACELL AND VAC   (Right)  Patient Location: PACU  Anesthesia Type:General  Level of Consciousness: awake, alert  and oriented  Airway & Oxygen Therapy: Patient Spontanous Breathing and Patient connected to face mask oxygen  Post-op Assessment: Report given to PACU RN and Post -op Vital signs reviewed and stable  Post vital signs: Reviewed and stable  Complications: No apparent anesthesia complications

## 2013-09-22 NOTE — Progress Notes (Signed)
Report called to [place] at [facility].  Pt left via ambulance. Nino Parsley

## 2013-09-22 NOTE — Op Note (Signed)
Operative Note   DATE OF OPERATION: 09/22/2013  SURGICAL DIVISION: Plastic Surgery  PREOPERATIVE DIAGNOSES:  Right leg hematoma, open wound right leg  POSTOPERATIVE DIAGNOSES:  same  PROCEDURE:  Surgical preparation for placement dermal matrix 100 cm2, application of A Cell dermal matrix totalling 200 cm2  SURGEON: B Hezikiah Retzloff MD MBA  ASSISTANT: none  ANESTHESIA:  General.   COMPLICATIONS: None immediate.  EBL: 50 ml   INDICATIONS FOR PROCEDURE:  The patient, [name], is a 62 y.o. female born on 10/14/1951, is here for treatment of open wound of right leg. She presented with spontaneous hematoma calf while on anticoagulation. She underwent debridement and evacuation of hematoma surgically. She has had one application of A Cell. She presents for debridement of further necrotic areas of skin due to pressure of hematoma and evacuation of additional old hematoma   CONSENT:  Informed consent was obtained directly from the patient. Risks, benefits and alternatives were fully discussed. Specific risks including but not limited to bleeding, infection, hematoma, seroma, scarring, pain, implant infection, wound healing problems, and need for further surgery were all discussed. The patient did have an ample opportunity to have questions answered to satisfaction.   DESCRIPTION OF PROCEDURE:  The patient was taken to the operating room. SCDs were in place and chemoprophylaxis has been held secondary to recent bleed . The patient's operative site was marked with patient in preoperative area and right leg was prepped and draped in a sterile fashion. A time out was performed and all information was confirmed to be correct.  Sharp debridement with knife was completed to remove necrotic skin and subcutaneous tissue at both most proximal and distal edges of wound. Currette was used to remove slough and necrotic fat from remainder of wound. Proximally she demonstrated flap soft tissue with deep cavity.  Following irrigation 1000g A Cell powder was applied within largely this cavity. Soft tissue flap was approximated to muscle and fascia with interrupted 3-0 vicryl. A Cell sheets were fenestrated and applied to wound, secured using running 4-0 chromic. Adpatic followed by surgical lubricant and VAC were applied to A Cell and wound. Pressure set to 100 mm Hg.   The patient was allowed to wake from anesthesia, extubated and taken to the recovery room in satisfactory condition.

## 2013-09-22 NOTE — Progress Notes (Signed)
Patient is cleared for discharge. Patient is upset at she feels she is not ready for discharge but understands medical clearance. Packet copied and placed in [facility]. ptar called for transportation. Patient understands no guarantee of payment. ptar called for transportation.  Loris Seelye C. Kraig Genis MSW, LCSW [phone]

## 2013-09-22 NOTE — Brief Op Note (Signed)
09/08/2013 - 09/22/2013  12:37 PM  PATIENT:  [name]  62 y.o. female  PRE-OPERATIVE DIAGNOSIS:  WOUND RIGHT LOWER LEG CALF   POST-OPERATIVE DIAGNOSIS:  wound right lower leg calf  PROCEDURE:  Procedure(s): IRRIGATION AND DEBRIDEMENT SURGICAL PREP PLACEMENT OF ACELL AND VAC   (Right)  SURGEON:  Surgeon(s) and Role:    * Glenna Fellows, MD - Primary  PHYSICIAN ASSISTANT:   ASSISTANTS: none   ANESTHESIA:   general  EBL:  Total I/O In: 0  Out: 50 [Blood:50]  BLOOD ADMINISTERED:none  DRAINS: none and wound VAC   LOCAL MEDICATIONS USED:  NONE  SPECIMEN:  No Specimen  DISPOSITION OF SPECIMEN:  N/A  COUNTS:  YES  TOURNIQUET:  * No tourniquets in log *  DICTATION: .Note written in EPIC  PLAN OF CARE: Return to floor  PATIENT DISPOSITION:  PACU - hemodynamically stable.      May d/c to SNF to [facility] at 100 mmHg. Do not change VAC. This will be done by Plastics. Please arrange for f/u with Dr. Leta Baptist 09/29/13 in afternoon.

## 2013-09-22 NOTE — Anesthesia Postprocedure Evaluation (Signed)
Anesthesia Post Note  Patient: [name]  Procedure(s) Performed: Procedure(s) (LRB): IRRIGATION AND DEBRIDEMENT SURGICAL PREP PLACEMENT OF ACELL AND VAC   (Right)  Anesthesia type: General  Patient location: PACU  Post pain: Pain level controlled  Post assessment: Post-op Vital signs reviewed  Last Vitals:  Filed Vitals:   09/22/13 1345  BP: 164/53  Pulse: 54  Temp: 36.3 C  Resp: 18    Post vital signs: Reviewed  Level of consciousness: sedated  Complications: No apparent anesthesia complications

## 2013-09-22 NOTE — Anesthesia Preprocedure Evaluation (Addendum)
Anesthesia Evaluation  Patient identified by MRN, date of birth, ID band Patient awake    Reviewed: Allergy & Precautions, H&P , NPO status , Patient's Chart, lab work & pertinent test results  History of Anesthesia Complications (+) history of anesthetic complications  Airway Mallampati: II TM Distance: >3 FB Neck ROM: Full    Dental no notable dental hx. (+) Poor Dentition and Dental Advisory Given   Pulmonary shortness of breath, asthma , sleep apnea , pneumonia -, former smoker,   08-03-13 CXR: vascular congestion, cardiomegaly. breath sounds clear to auscultation  Pulmonary exam normal + decreased breath sounds      Cardiovascular hypertension, Pt. on home beta blockers and Pt. on medications + Peripheral Vascular Disease and +CHF + dysrhythmias Atrial Fibrillation Rhythm:Regular Rate:Normal  ECG reviewed.  ECHO 02-11-13: EF 40-45%. Grade 2 diastolic dysfunction  .Spect 01-25-12: EF 60%. No ischemia.   Neuro/Psych PSYCHIATRIC DISORDERS Anxiety Depression CVA    GI/Hepatic Neg liver ROS, GERD-  Medicated,  Endo/Other  diabetes, Type 1, Insulin DependentMorbid obesity  Renal/GU Renal Insufficiency and CRFRenal diseaseCKD stage 4. Cr 2.13. K 5.0   negative genitourinary   Musculoskeletal negative musculoskeletal ROS (+)   Abdominal (+) + obese,   Peds negative pediatric ROS (+)  Hematology negative hematology ROS (+)   Anesthesia Other Findings   Reproductive/Obstetrics negative OB ROS                         Anesthesia Physical Anesthesia Plan  ASA: IV  Anesthesia Plan: General   Post-op Pain Management:    Induction: Intravenous  Airway Management Planned: LMA  Additional Equipment:   Intra-op Plan:   Post-operative Plan: Extubation in OR  Informed Consent: I have reviewed the patients History and Physical, chart, labs and discussed the procedure including the risks, benefits  and alternatives for the proposed anesthesia with the patient or authorized representative who has indicated his/her understanding and acceptance.   Dental advisory given  Plan Discussed with: CRNA  Anesthesia Plan Comments:         Anesthesia Quick Evaluation

## 2013-09-22 NOTE — Discharge Summary (Signed)
Physician Discharge Summary  MYLI PAE WGN:[id number] DOB: 1951/11/23 DOA: 09/08/2013  PCP: Willey Blade, MD  Admit date: 09/08/2013 Discharge date: 09/22/2013  Time spent: 45 minutes  Recommendations for Outpatient Follow-up:  1. Recommend repeat CBC/chem 7 in about one to 2 days at skilled nursing facility-patient received 7 units packed red blood cells this admission 2. Patient has been recommended to continue skilled nursing therapy is at a facility 3. Patient will need further wound care and assessment by Dr. Marzetta Board for plastic surgery and discussions regarding placement of the flap should occur with her.  4. Discussion with cardiologist/care physician needs to occur with regards to resumption of anticoagulation which should he okayed by plastic surgeon prior to reinitiation. 5. Patient continue on oxygen 2 L chronically even her COPD and obstructive sleep apnea history. May need further pulmonary as an outpatient and she does not use CPAP  Discharge Diagnoses:  Principal Problem:   Spontaneous hematoma of lower leg Active Problems:   HYPERTENSION   DM (diabetes mellitus)   Chronic anticoagulation   CKD (chronic kidney disease), stage IV   Anemia of chronic disease   PAF (paroxysmal atrial fibrillation)   Morbid obesity   Supratherapeutic INR   SOB (shortness of breath)   Discharge Condition: Stable  Diet recommendation: Heart healthy vitamin K controlled  Filed Weights   09/19/13 0431 09/21/13 0548 09/22/13 0431  Weight: 123.9 kg (273 lb 2.4 oz) 123.2 kg (271 lb 9.7 oz) 124.1 kg (273 lb 9.5 oz)    History of present illness:  62 year old African American ? known history of paroxysmal atrial fibrillation on chronic Coumadin, morbid obesity, CKD stage IV, anemia of chronic disease was admitted on 09/09/2013 with spontaneous hematoma of the right calf without any trauma. Patient was seen in ED given vitamin K 2 units E. Hale Bogus was consulted and subsequently plastic  surgery was consulted as well patient was discontinued off of Coumadin given this event and was taken to surgery on multiple event during this hospital stay. Patient was transfused about 7 units of her course of hospitalization 10/2 -10/15 Please see below for full details   Hospital Course:   #1 large right posterior calf hematoma with large residual wound  Status post evacuation and debridement per surgery 09/10/2013. Patient is currently off Coumadin. Patient is status post wound VAC placement per plastic surgery. Patient was then returned to the OR on 10/15 as per plastic surgery for further debridement Patient will likely need outpatient followup at the wound care center.  #2 acute/chronic diastolic heart failure exacerbation-with multifactorial dyspnea Patient with significant shortness of breath with conversation earlier during hospital stay. Clinical exam improved, and close to baseline. Chest x-ray was c/w volume overload. Continue oral Lasix 40 mg on discharge home Patient also carries a diagnosis of obstructive sleep apnea as well as COPD which may be contributing confounding factors in her shortness of breath-she also had severe acute loss anemia which may of compounded these issues. She is currently euvolemic and stable #3 acute blood loss anemia  Secondary to spontaneous hematoma of the lower extremity in the setting of supratherapeutic INR. Patient is status post 7 units packed red blood cells. Hemoglobin is currently stable, at 8.1.  Needs labs in about 2-3 days #4 hypertension  Systolic blood pressures improving. Continue Norvasc, metoprolol, hydralazine.  #5 chronic anti-coagulation  Coumadin on hold. Recommend discussion with her primary cardiologist primary care physician once cleared by plastic surgeon to use the same, any form of anticoagulation  inclusive but not limited to aspirin and Coumadin #6 diabetes mellitus  Hemoglobin A1c was 5.9 and 08/18/2013. CBGs have ranged from  90-110. Patient will resume her Lantus 20 units twice a day. She does not need at this stage any short-acting insulin or any oral controlling agents and this will be deferred to her primary care physician  #7 chronic kidney disease stage IV  Patient close to baseline creatinine. Continue diuretics and follow.  #8 paroxysmal atrial fibrillation  Amiodarone and metoprolol for rate control. Coumadin has been discontinued secondary to hematoma.  #10 morbid obesity  #11 prophylaxis  PPI for GI prophylaxis. SCDs for DVT prophylaxis.    Consultants:  Orthopedic Surgery: Dr. Charlann Boxer 09/09/2013  Plastic Surgery : Dr Kelly Splinter 09/13/13  General surgery Dr. Carolynne Edouard 09/10/2013 Procedures:  Ct Tibia Fibula Right Wo Contrast 09/09/2013 Large subcutaneous posterior medial calf hematoma.  Dg Chest Port 1 View 09/10/2013 Status post right central line placement without pneumothorax. Crowding of the vascular markings due to a poor inspiratory effort.  Wound Vac 09/15/13  Evacuation of right leg hematoma and excisional Debridement of the right leg 09/10/2013 per Dr. Carolynne Edouard  7 units PRBCs Antibiotics:  Levaquin 09/08/13-->09/14/13  Discharge Exam: Filed Vitals:   09/22/13 0431  BP: 154/54  Pulse: 63  Temp: 97.8 F (36.6 C)  Resp: 20   Alert, little bit apprehensive about surgery, husband in room, multiple questions answered  General: alert, EOJMI, Obese Cardiovascular: s1 s2 no m/r/g Respiratory: CTA B  Discharge Instructions   Follow-up Information   Follow up with [name] [name], MD.   Specialty:  Internal Medicine   Contact information:   [facility] [address] [place] [address] [phone]       Follow up with Glenna Fellows, MD. (As per surgeon)    Specialty:  Plastic Surgery   Contact information:   [address] [place] [address] [place] [address] [phone]       Follow up with Cassie Freer, Benjaman Lobe, MD. (As per prior instructions)    Specialty:   Dermatology   Contact information:   [address] Margo Aye, P.A. [place] [address] [phone]        Discharge Orders   Future Appointments Provider Department Dept Phone   10/08/2013 10:00 AM Randall Hiss, MD [facility] for Infectious Disease [phone]   Future Orders Complete By Expires   Diet - low sodium heart healthy  As directed    Discharge instructions  As directed    Comments:     Please insure followup with plastic surgery Please insure followup with dermatology Please insure followup with pulmonology for your chronic obstructive pulmonary disease/for obstructive sleep apnea  might need lab work in about one to 2 days   Increase activity slowly  As directed        Medication List    STOP taking these medications       insulin aspart 100 UNIT/ML injection  Commonly known as:  novoLOG      TAKE these medications       albuterol 108 (90 BASE) MCG/ACT inhaler  Commonly known as:  PROVENTIL HFA;VENTOLIN HFA  Inhale 2 puffs into the lungs every 6 (six) hours as needed for wheezing or shortness of breath.     allopurinol 100 MG tablet  Commonly known as:  ZYLOPRIM  Take 100 mg by mouth daily.     amiodarone 200 MG tablet  Commonly known as:  PACERONE  Take 200 mg by mouth daily.     amLODipine 10 MG tablet  Commonly known as:  NORVASC  Take 1 tablet (10 mg total) by mouth daily.     atorvastatin 20 MG tablet  Commonly known as:  LIPITOR  Take 20 mg by mouth at bedtime.     ferrous sulfate 325 (65 FE) MG tablet  Take 325 mg by mouth 3 (three) times daily with meals.     furosemide 40 MG tablet  Commonly known as:  LASIX  Take 1 tablet (40 mg total) by mouth daily.     hydrALAZINE 50 MG tablet  Commonly known as:  APRESOLINE  Take 50 mg by mouth 3 (three) times daily.     insulin glargine 100 UNIT/ML injection  Commonly known as:  LANTUS  Inject 20 Units into the skin 2 (two) times daily.     levofloxacin  250 MG tablet  Commonly known as:  LEVAQUIN  Take 250 mg by mouth daily.     metoprolol tartrate 25 MG tablet  Commonly known as:  LOPRESSOR  Take 0.5 tablets (12.5 mg total) by mouth 2 (two) times daily.     ondansetron 4 MG tablet  Commonly known as:  ZOFRAN  Take 1 tablet (4 mg total) by mouth every 6 (six) hours as needed for nausea.     oxyCODONE-acetaminophen 5-325 MG per tablet  Commonly known as:  PERCOCET/ROXICET  Take 1-2 tablets by mouth every 4 (four) hours as needed.     pantoprazole 40 MG tablet  Commonly known as:  PROTONIX  Take 40 mg by mouth daily.     warfarin 10 MG tablet  Commonly known as:  COUMADIN  Take 10 mg by mouth daily.     zolpidem 5 MG tablet  Commonly known as:  AMBIEN  Take 5 mg by mouth at bedtime as needed for sleep.       Allergies  Allergen Reactions  . Daptomycin Other (See Comments)    Elevated CPK  . Morphine And Related Nausea And Vomiting  . Cephalexin Rash    unknown  . Celecoxib     unknown  . Codeine     unknown  . Fluoxetine Hcl     unknown  . Latex     REACTION: undefined  . Ofloxacin     unknown  . Penicillins     unknown  . Rofecoxib     unknown  . Sulfonamide Derivatives     unknown  . Tramadol Nausea And Vomiting      The results of significant diagnostics from this hospitalization (including imaging, microbiology, ancillary and laboratory) are listed below for reference.    Significant Diagnostic Studies: Ct Tibia Fibula Right Wo Contrast  09/09/2013   CLINICAL DATA:  Evaluate calf hematoma.  EXAM: CT OF THE RIGHT TIBIA FIBULA WITHOUT CONTRAST  TECHNIQUE: Imaging through the right calf without intravenous contrast. Multiplanar reconstructed images were performed and reviewed.  COMPARISON:  Plain films 03/10/2012  FINDINGS: There is a large complex fluid collection within the subcutaneous soft tissues of the right calf posterior medially. This measures 20 x 15 x 8 cm. Areas of internal layering high  density. Findings most likely reflective of large partially liquified hematoma. This remains in the subcutaneous soft tissues. No hematoma within the deep tissues. No intramuscular abnormality. No acute bony abnormality.  IMPRESSION: Large subcutaneous posterior medial calf hematoma.   Electronically Signed   By: Charlett Nose M.D.  On: 09/09/2013 02:38   Dg Chest Port 1 View  09/16/2013   *RADIOLOGY REPORT*  Clinical Data: Shortness of breath and weakness.  PORTABLE CHEST - 1 VIEW  Comparison: Chest radiograph performed 09/10/2013  Findings: The patient's right IJ line is noted ending about the proximal SVC.  The lungs are hypoexpanded.  Vascular crowding and vascular congestion are seen.  Increased interstitial markings are most compatible with mildly worsened pulmonary edema.  A small left pleural effusion is suspected.  No pneumothorax is seen.  The cardiomediastinal silhouette is enlarged.  No acute osseous abnormalities are identified.  IMPRESSION: Lungs hypoexpanded.  Vascular congestion and cardiomegaly noted. Increased interstitial markings are most compatible with mildly worsened pulmonary edema.   Original Report Authenticated By: Tonia Ghent, M.D.   Dg Chest Port 1 View  09/10/2013   CLINICAL DATA:  Status post central line placement  EXAM: PORTABLE CHEST - 1 VIEW  COMPARISON:  08/03/2013  FINDINGS: The cardiac shadow remains enlarged. The lungs are incompletely aerated with crowding of the vascular markings. A central line is now seen at the cavoatrial junction. No pneumothorax is noted. No acute bony abnormality is seen.  IMPRESSION: Status post right central line placement without pneumothorax.  Crowding of the vascular markings due to a poor inspiratory effort.   Electronically Signed   By: Alcide Clever M.D.   On: 09/10/2013 15:20    Microbiology: No results found for this or any previous visit (from the past 240 hour(s)).   Labs: Basic Metabolic Panel:  Recent Labs Lab  09/18/13 0405 09/19/13 0515 09/20/13 0535 09/21/13 0430 09/22/13 0420  NA 139 142 140 141 142  K 3.9 3.8 3.9 3.8 3.7  CL 104 106 106 106 108  CO2 29 28 29 28 28   GLUCOSE 100* 104* 119* 100* 111*  BUN 41* 44* 38* 39* 33*  CREATININE 2.52* 2.09* 1.83* 1.82* 1.78*  CALCIUM 8.3* 8.7 8.6 9.0 8.6   Liver Function Tests: No results found for this basename: AST, ALT, ALKPHOS, BILITOT, PROT, ALBUMIN,  in the last 168 hours No results found for this basename: LIPASE, AMYLASE,  in the last 168 hours No results found for this basename: AMMONIA,  in the last 168 hours CBC:  Recent Labs Lab 09/18/13 0405 09/19/13 0515 09/20/13 0535 09/21/13 0430 09/22/13 0420  WBC 7.1 7.0 8.2 7.6 7.8  HGB 7.9* 8.0* 8.3* 8.1* 8.1*  HCT 25.3* 25.8* 26.7* 26.1* 26.1*  MCV 88.8 90.2 89.6 89.7 88.8  PLT 297 343 371 369 341   Cardiac Enzymes: No results found for this basename: CKTOTAL, CKMB, CKMBINDEX, TROPONINI,  in the last 168 hours BNP: BNP (last 3 results)  Recent Labs  02/15/13 0434 07/05/13 1536 08/03/13 1122  PROBNP 438.6* 1581.0* 1789.0*   CBG:  Recent Labs Lab 09/21/13 0745 09/21/13 1200 09/21/13 1648 09/21/13 2155 09/22/13 0811  GLUCAP 115* 128* 135* 90 110*       Signed:  Loistine Eberlin, JAI-GURMUKH  [facility] 09/22/2013, 9:42 AM

## 2013-09-22 NOTE — H&P (View-Only) (Signed)
Hx spontaneous hematoma calf on anticoagulation post debridement. Post A Cell application and VAC on 09/15/13  Disucussed with CM and Dr. Thompson that Plastics team felt additional debridement would be beneficial at last procedure. Unable to do this at time at bedside. We have scheduled pt for debridement in OR tomorrow am tenatively 11 am. Plan additional A Cell application and VAC. Confirmed with CM that pt will go to SNF with VAC.   Discussed with pt plan, hold SNF transfer until this is complete. Discussed that SNF is temporary but will be helpful for wound care, VAC, and returning to full ambulation.  Some issues with VAC yesterday but appears to be working well this am.  Labs reviewed: anemic at 8.1 Hb, Cr 1.82  PE Alert oriented Clear Regular  BLE with edema, RLE with VAC in place, some bloody drainage beneath dressing but no cellulitis Calf soft   A/P Anticoagulation held presently, plan OR in am. May transfer to SNF anytime thereafter. Will need first VAC change in appr 5 days post procedure. Can do this in SNF or may f/u our office [phone]) or Wound Center.  Pt asking about skin grafts, discussed this is possible but not at tomorrow procedure, will reassess as wound progresses.  

## 2013-09-22 NOTE — Interval H&P Note (Signed)
History and Physical Interval Note:  09/22/2013 10:20 AM  [name]  has presented today for surgery, with the diagnosis of WOUND RIGHT LOWER LEG CALF   The various methods of treatment have been discussed with the patient and family. After consideration of risks, benefits and other options for treatment, the patient has consented to  Procedure(s): IRRIGATION AND DEBRIDEMENT SURGICAL PREP PLACEMENT OF ACELL AND VAC   (Right) as a surgical intervention .  The patient's history has been reviewed, patient examined, no change in status, stable for surgery.  I have reviewed the patient's chart and labs.  Questions were answered to the patient's satisfaction.     [name]

## 2013-09-22 NOTE — Clinical Social Work Placement (Signed)
     Clinical Social Work Department CLINICAL SOCIAL WORK PLACEMENT NOTE 09/22/2013  Patient:  [name], [name]  Account Number:  [id number] Admit date:  09/08/2013  Clinical Social Worker:  Becky Sax, LCSW  Date/time:  09/22/2013 12:00 M  Clinical Social Work is seeking post-discharge placement for this patient at the following level of care:   SKILLED NURSING   (*CSW will update this form in Epic as items are completed)   09/22/2013  Patient/family provided with [facility] Department of Clinical Social Works list of facilities offering this level of care within the geographic area requested by the patient (or if unable, by the patients family).  09/22/2013  Patient/family informed of their freedom to choose among providers that offer the needed level of care, that participate in Medicare, Medicaid or managed care program needed by the patient, have an available bed and are willing to accept the patient.  09/22/2013  Patient/family informed of [facility] ownership interest in [facility], as well as of the fact that they are under no obligation to receive care at this facility.  PASARR submitted to EDS on 09/22/2013 PASARR number received from EDS on 09/22/2013  FL2 transmitted to all facilities in geographic area requested by pt/family on  09/22/2013 FL2 transmitted to all facilities within larger geographic area on   Patient informed that his/her managed care company has contracts with or will negotiate with  certain facilities, including the following:     Patient/family informed of bed offers received:  09/22/2013 Patient chooses bed at [facility] Physician recommends and patient chooses bed at    Patient to be transferred to [facility] on  09/22/2013 Patient to be transferred to facility by ptar  The following physician request were entered in Epic:   Additional Comments:

## 2013-09-23 ENCOUNTER — Encounter (HOSPITAL_COMMUNITY): Payer: Self-pay | Admitting: Plastic Surgery

## 2013-10-04 ENCOUNTER — Ambulatory Visit: Payer: Medicare HMO | Admitting: Infectious Disease

## 2013-10-04 ENCOUNTER — Encounter (HOSPITAL_COMMUNITY): Payer: Self-pay | Admitting: *Deleted

## 2013-10-04 ENCOUNTER — Encounter (HOSPITAL_COMMUNITY): Payer: Self-pay | Admitting: Pharmacy Technician

## 2013-10-04 NOTE — Progress Notes (Signed)
Need orders in EPIC.  Surgery scheduled for 10/07/13.  Thank You.

## 2013-10-04 NOTE — H&P (Addendum)
  Patient ID: [name] is a 61 y.o. female.  HPI  Returns one week post debridement and application A Cell. Pt developed hematoma R calf while on anticoagulation with subsequent soft tissue necrosis. Required multiple transfusions and had recent d/c to SNF. Anticoagulation has been held. Pt states she is unable to get out of her WC today. Is doing PT at SNF.   Review of Systems  Objective:   Physical Exam  Alert oriented  Clear  Regular  bilat feet with edema pitting  RLE VAC removed open wound 16x15 x0.2 cm open wound partially granulated with adherent A Cell.    Assessment:   Open wound of calf  Plan:     Discussed with pt skin grafting vs healing with wound care. Given size of wound latter would take several months. Discussed skin grafting as OP surgery, donor site pain and wound care, VAC over grafted area post op. Risks graft failure, anesthesia, bleeding, need for additional procedures and prolonged wound care, cardiovascular complications, DVT/PE reviewed. Will plan for surgery in next week.    Spoke with SNF on 09/30/13 and coumadin has been held.

## 2013-10-05 NOTE — Progress Notes (Signed)
Nursing facility received preop instructions by fax per Nicole,nurse.

## 2013-10-05 NOTE — Progress Notes (Signed)
Requested current list of allergies since did not receive with meds and current med list with times medications are administered.  Spoke with Lawanna Kobus, nurse.  She will fax .  Informed her I would be sending preop instructions once she sends current allergy list and times medications are administered.

## 2013-10-05 NOTE — Progress Notes (Signed)
Spoke with husband regarding surgery.  He is aware patient is having surgery and states patient is alert and oriented and will sign own consent form.  Spoke with nurse at [facility] and informed them I would be sening preop instructions by fax.

## 2013-10-07 ENCOUNTER — Encounter (HOSPITAL_COMMUNITY): Payer: Medicare HMO | Admitting: Certified Registered"

## 2013-10-07 ENCOUNTER — Ambulatory Visit (HOSPITAL_COMMUNITY)
Admission: RE | Admit: 2013-10-07 | Discharge: 2013-10-07 | Disposition: A | Discharge status: Skilled Nursing Facility | Payer: Medicare HMO | Source: Ambulatory Visit | Attending: Plastic Surgery | Admitting: Plastic Surgery

## 2013-10-07 ENCOUNTER — Ambulatory Visit (HOSPITAL_COMMUNITY): Payer: Medicare HMO | Admitting: Certified Registered"

## 2013-10-07 ENCOUNTER — Encounter (HOSPITAL_COMMUNITY): Payer: Self-pay | Admitting: *Deleted

## 2013-10-07 ENCOUNTER — Encounter (HOSPITAL_COMMUNITY)
Admission: RE | Disposition: A | Discharge status: Skilled Nursing Facility | Payer: Self-pay | Source: Ambulatory Visit | Attending: Plastic Surgery

## 2013-10-07 ENCOUNTER — Ambulatory Visit (HOSPITAL_COMMUNITY): Payer: Medicare HMO

## 2013-10-07 DIAGNOSIS — Z7901 Long term (current) use of anticoagulants: Secondary | ICD-10-CM | POA: Insufficient documentation

## 2013-10-07 DIAGNOSIS — X58XXXA Exposure to other specified factors, initial encounter: Secondary | ICD-10-CM | POA: Insufficient documentation

## 2013-10-07 DIAGNOSIS — S81001D Unspecified open wound, right knee, subsequent encounter: Secondary | ICD-10-CM

## 2013-10-07 DIAGNOSIS — I517 Cardiomegaly: Secondary | ICD-10-CM | POA: Insufficient documentation

## 2013-10-07 DIAGNOSIS — S81009A Unspecified open wound, unspecified knee, initial encounter: Secondary | ICD-10-CM | POA: Insufficient documentation

## 2013-10-07 DIAGNOSIS — R609 Edema, unspecified: Secondary | ICD-10-CM | POA: Insufficient documentation

## 2013-10-07 HISTORY — DX: Morbid (severe) obesity due to excess calories: E66.01

## 2013-10-07 HISTORY — PX: SKIN SPLIT GRAFT: SHX444

## 2013-10-07 HISTORY — DX: Chronic kidney disease, stage 4 (severe): N18.4

## 2013-10-07 LAB — BASIC METABOLIC PANEL
BUN: 36 mg/dL — ABNORMAL HIGH (ref 6–23)
CO2: 25 mEq/L (ref 19–32)
Calcium: 8.9 mg/dL (ref 8.4–10.5)
Chloride: 109 mEq/L (ref 96–112)
Creatinine, Ser: 1.91 mg/dL — ABNORMAL HIGH (ref 0.50–1.10)
GFR calc Af Amer: 31 mL/min — ABNORMAL LOW (ref >90)
GFR calc non Af Amer: 27 mL/min — ABNORMAL LOW (ref >90)
Glucose, Bld: 92 mg/dL (ref 70–99)
Potassium: 3.9 mEq/L (ref 3.5–5.1)
Sodium: 145 mEq/L (ref 135–145)

## 2013-10-07 LAB — APTT: aPTT: 32 seconds (ref 24–37)

## 2013-10-07 LAB — CBC
HCT: 27.1 % — ABNORMAL LOW (ref 36.0–46.0)
Hemoglobin: 8.1 g/dL — ABNORMAL LOW (ref 12.0–15.0)
MCH: 26.5 pg (ref 26.0–34.0)
MCHC: 29.9 g/dL — ABNORMAL LOW (ref 30.0–36.0)
MCV: 88.6 fL (ref 78.0–100.0)
Platelets: 258 10*3/uL (ref 150–400)
RBC: 3.06 MIL/uL — ABNORMAL LOW (ref 3.87–5.11)
RDW: 17.7 % — ABNORMAL HIGH (ref 11.5–15.5)
WBC: 9.1 10*3/uL (ref 4.0–10.5)

## 2013-10-07 LAB — GLUCOSE, CAPILLARY
Glucose-Capillary: 86 mg/dL (ref 70–99)
Glucose-Capillary: 103 mg/dL — ABNORMAL HIGH (ref 70–99)
Glucose-Capillary: 109 mg/dL — ABNORMAL HIGH (ref 70–99)

## 2013-10-07 LAB — SURGICAL PCR SCREEN: Staphylococcus aureus: NEGATIVE

## 2013-10-07 LAB — PROTIME-INR
INR: 1.02 (ref 0.00–1.49)
Prothrombin Time: 13.2 seconds (ref 11.6–15.2)

## 2013-10-07 SURGERY — APPLICATION, GRAFT, SKIN, SPLIT-THICKNESS
Anesthesia: General | Site: Thigh | Laterality: Right | Wound class: Clean Contaminated

## 2013-10-07 MED ORDER — LACTATED RINGERS IV SOLN: INTRAVENOUS | Status: DC

## 2013-10-07 MED ORDER — SODIUM CHLORIDE 0.9 % IR SOLN
Status: DC | PRN
  Administered 2013-10-07: 1000 mL

## 2013-10-07 MED ORDER — FENTANYL CITRATE 0.05 MG/ML IJ SOLN
INTRAMUSCULAR | Status: DC | PRN
  Administered 2013-10-07 (×4): 50 ug via INTRAVENOUS

## 2013-10-07 MED ORDER — EPINEPHRINE HCL 1 MG/ML IJ SOLN
INTRAMUSCULAR | Status: AC
  Filled 2013-10-07: qty 1

## 2013-10-07 MED ORDER — MINERAL OIL LIGHT 100 % EX OIL
TOPICAL_OIL | CUTANEOUS | Status: DC | PRN
  Administered 2013-10-07: 1 via TOPICAL

## 2013-10-07 MED ORDER — MUPIROCIN 2 % EX OINT
TOPICAL_OINTMENT | Freq: Two times a day (BID) | CUTANEOUS | Status: DC
Start: 2013-10-07 — End: 2013-10-07
  Administered 2013-10-07: 1 via NASAL
  Filled 2013-10-07: qty 22

## 2013-10-07 MED ORDER — TISSEEL VH 10 ML EX KIT
PACK | CUTANEOUS | Status: AC
  Filled 2013-10-07: qty 1

## 2013-10-07 MED ORDER — MINERAL OIL LIGHT 100 % EX OIL
TOPICAL_OIL | CUTANEOUS | Status: AC
  Filled 2013-10-07: qty 25

## 2013-10-07 MED ORDER — CHLORHEXIDINE GLUCONATE 4 % EX LIQD: 1.0000 "application " | Freq: Once | CUTANEOUS | Status: DC

## 2013-10-07 MED ORDER — CLINDAMYCIN PHOSPHATE 900 MG/50ML IV SOLN
900.0000 mg | Freq: Once | INTRAVENOUS | Status: AC
  Administered 2013-10-07: 900 mg via INTRAVENOUS
  Filled 2013-10-07: qty 50

## 2013-10-07 MED ORDER — CLINDAMYCIN PHOSPHATE 900 MG/50ML IV SOLN
INTRAVENOUS | Status: AC
  Filled 2013-10-07: qty 50

## 2013-10-07 MED ORDER — TISSEEL VH 10 ML EX KIT
PACK | CUTANEOUS | Status: DC | PRN
  Administered 2013-10-07: 1

## 2013-10-07 MED ORDER — MEPERIDINE HCL 50 MG/ML IJ SOLN: 6.2500 mg | INTRAMUSCULAR | Status: DC | PRN

## 2013-10-07 MED ORDER — PROPOFOL 10 MG/ML IV BOLUS
INTRAVENOUS | Status: DC | PRN
  Administered 2013-10-07: 150 mg via INTRAVENOUS

## 2013-10-07 MED ORDER — EPHEDRINE SULFATE 50 MG/ML IJ SOLN
INTRAMUSCULAR | Status: DC | PRN
  Administered 2013-10-07: 5 mg via INTRAVENOUS
  Administered 2013-10-07: 10 mg via INTRAVENOUS

## 2013-10-07 MED ORDER — FENTANYL CITRATE 0.05 MG/ML IJ SOLN: 25.0000 ug | INTRAMUSCULAR | Status: DC | PRN

## 2013-10-07 MED ORDER — LIDOCAINE HCL (CARDIAC) 20 MG/ML IV SOLN
INTRAVENOUS | Status: DC | PRN
  Administered 2013-10-07: 30 mg via INTRAVENOUS

## 2013-10-07 MED ORDER — PROMETHAZINE HCL 25 MG/ML IJ SOLN
6.2500 mg | INTRAMUSCULAR | Status: AC | PRN
  Administered 2013-10-07 (×2): 6.25 mg via INTRAVENOUS
  Filled 2013-10-07: qty 1

## 2013-10-07 MED ORDER — ONDANSETRON HCL 4 MG/2ML IJ SOLN
INTRAMUSCULAR | Status: DC | PRN
  Administered 2013-10-07: 4 mg via INTRAVENOUS

## 2013-10-07 MED ORDER — LACTATED RINGERS IV SOLN
INTRAVENOUS | Status: DC | PRN
  Administered 2013-10-07: 07:00:00 via INTRAVENOUS

## 2013-10-07 MED ORDER — MIDAZOLAM HCL 5 MG/5ML IJ SOLN
INTRAMUSCULAR | Status: DC | PRN
  Administered 2013-10-07: 2 mg via INTRAVENOUS

## 2013-10-07 MED ORDER — HYDROGEN PEROXIDE 3 % EX SOLN
CUTANEOUS | Status: AC
  Filled 2013-10-07: qty 473

## 2013-10-07 SURGICAL SUPPLY — 39 items
APL SKNCLS STERI-STRIP NONHPOA (GAUZE/BANDAGES/DRESSINGS) ×4
APPLIER CLIP 11 MED OPEN (CLIP)
APR CLP MED 11 20 MLT OPN (CLIP)
BAG SPEC THK2 15X12 ZIP CLS (MISCELLANEOUS) ×1
BAG ZIPLOCK 12X15 (MISCELLANEOUS) ×2 IMPLANT
BANDAGE ELASTIC 6 VELCRO ST LF (GAUZE/BANDAGES/DRESSINGS) IMPLANT
BANDAGE GAUZE ELAST BULKY 4 IN (GAUZE/BANDAGES/DRESSINGS) ×1 IMPLANT
BENZOIN TINCTURE PRP APPL 2/3 (GAUZE/BANDAGES/DRESSINGS) ×4 IMPLANT
BLADE DERMATOME SS (BLADE) ×2 IMPLANT
BLADE SURG ROTATE 9660 (MISCELLANEOUS) ×2 IMPLANT
BURN MATRIX MATRISTEM 10X15 (Tissue) ×1 IMPLANT
CLIP APPLIE 11 MED OPEN (CLIP) IMPLANT
CLOTH BEACON ORANGE TIMEOUT ST (SAFETY) ×2 IMPLANT
COVER SURGICAL LIGHT HANDLE (MISCELLANEOUS) ×2 IMPLANT
DEPRESSOR TONGUE BLADE STERILE (MISCELLANEOUS) ×4 IMPLANT
DERMACARRIERS GRAFT 1 TO 1.5 (DISPOSABLE) ×4
DRAPE LAPAROTOMY T 102X78X121 (DRAPES) ×2 IMPLANT
DRSG ADAPTIC 3X8 NADH LF (GAUZE/BANDAGES/DRESSINGS) IMPLANT
DRSG EMULSION OIL 3X16 NADH (GAUZE/BANDAGES/DRESSINGS) ×3 IMPLANT
DRSG PAD ABDOMINAL 8X10 ST (GAUZE/BANDAGES/DRESSINGS) ×1 IMPLANT
ELECT REM PT RETURN 9FT ADLT (ELECTROSURGICAL)
ELECTRODE REM PT RTRN 9FT ADLT (ELECTROSURGICAL) IMPLANT
GAUZE XEROFORM 5X9 LF (GAUZE/BANDAGES/DRESSINGS) IMPLANT
GRAFT DERMACARRIERS 1 TO 1.5 (DISPOSABLE) ×1 IMPLANT
HANDPIECE INTERPULSE COAX TIP (DISPOSABLE)
KIT BASIN OR (CUSTOM PROCEDURE TRAY) ×2 IMPLANT
MANIFOLD NEPTUNE II (INSTRUMENTS) ×2 IMPLANT
MATRIX SURGICAL PSM 7X10CM (Tissue) ×1 IMPLANT
NS IRRIG 1000ML POUR BTL (IV SOLUTION) ×2 IMPLANT
PACK LOWER EXTREMITY WL (CUSTOM PROCEDURE TRAY) ×2 IMPLANT
PADDING CAST COTTON 6X4 STRL (CAST SUPPLIES) IMPLANT
SET HNDPC FAN SPRY TIP SCT (DISPOSABLE) IMPLANT
SPONGE GAUZE 4X4 12PLY (GAUZE/BANDAGES/DRESSINGS) ×3 IMPLANT
STAPLER VISISTAT 35W (STAPLE) ×4 IMPLANT
SUT CHROMIC 4 0 PS 2 18 (SUTURE) ×3 IMPLANT
SUT VIC AB 2-0 SH 18 (SUTURE) IMPLANT
TAPE CLOTH SURG 6X10 WHT LF (GAUZE/BANDAGES/DRESSINGS) ×1 IMPLANT
UNDERPAD 30X30 INCONTINENT (UNDERPADS AND DIAPERS) ×2 IMPLANT
WATER STERILE IRR 1500ML POUR (IV SOLUTION) ×2 IMPLANT

## 2013-10-07 NOTE — Preoperative (Signed)
Beta Blockers   Reason not to administer Beta Blockers:Not Applicable 

## 2013-10-07 NOTE — Anesthesia Postprocedure Evaluation (Signed)
  Anesthesia Post-op Note  Patient: [name]  Procedure(s) Performed: Procedure(s) (LRB): SPLIT THICKNESS SKIN GRAFT RIGHT THIGH TO RIGHT LEG, PLACEMENT OF A CEL AND WOUND VAC TO RIGHT THIGH   (Right)  Patient Location: PACU  Anesthesia Type: General  Level of Consciousness: awake and alert   Airway and Oxygen Therapy: Patient Spontanous Breathing  Post-op Pain: mild  Post-op Assessment: Post-op Vital signs reviewed, Patient's Cardiovascular Status Stable, Respiratory Function Stable, Patent Airway and No signs of Nausea or vomiting  Last Vitals:  Filed Vitals:   10/07/13 1036  BP: 149/96  Pulse: 55  Temp: 36.8 C  Resp: 18    Post-op Vital Signs: stable   Complications: No apparent anesthesia complications

## 2013-10-07 NOTE — Brief Op Note (Signed)
10/07/2013  9:07 AM  PATIENT:  [name]  62 y.o. female  PRE-OPERATIVE DIAGNOSIS:  OPEN WOUND RIGHT LOWER LEG   POST-OPERATIVE DIAGNOSIS:  OPEN WOUND RIGHT LOWER LEG   PROCEDURE:  Procedure(s): SPLIT THICKNESS SKIN GRAFT RIGHT THIGH TO RIGHT LEG, PLACEMENT OF A CEL AND WOUND VAC TO RIGHT THIGH   (Right)  SURGEON:  Surgeon(s) and Role:    * Glenna Fellows, MD - Primary  PHYSICIAN ASSISTANT: Shawn Rayburn PA  ASSISTANTS:    ANESTHESIA:   general  EBL:     BLOOD ADMINISTERED:none  DRAINS: wound VAC   LOCAL MEDICATIONS USED:  NONE  SPECIMEN:  No Specimen  DISPOSITION OF SPECIMEN:  N/A  COUNTS:  YES  TOURNIQUET:  * No tourniquets in log *  DICTATION: .Note written in EPIC  PLAN OF CARE: Discharge to home after PACU  PATIENT DISPOSITION:  PACU - hemodynamically stable.   Delay start of Pharmacological VTE agent (>24hrs) due to surgical blood loss or risk of bleeding: not applicable

## 2013-10-07 NOTE — Interval H&P Note (Signed)
History and Physical Interval Note:  10/07/2013 7:07 AM  [name]  has presented today for surgery, with the diagnosis of OPEN WOUND RIGHT LOWER LEG   The various methods of treatment have been discussed with the patient and family. After consideration of risks, benefits and other options for treatment, the patient has consented to  SPLIT THICKNESS SKIN GRAFT FROM RIGHT THIGH TO RIGHT LEG, APPLICATION A CEL TO RIGHT THIGH as a surgical intervention .  The patient's history has been reviewed, patient examined, no change in status, stable for surgery.  I have reviewed the patient's chart and labs.  Questions were answered to the patient's satisfaction.     [name]

## 2013-10-07 NOTE — Anesthesia Preprocedure Evaluation (Signed)
Anesthesia Evaluation  Patient identified by MRN, date of birth, ID band Patient awake    Reviewed: Allergy & Precautions, H&P , NPO status , Patient's Chart, lab work & pertinent test results  History of Anesthesia Complications (+) history of anesthetic complications  Airway Mallampati: II TM Distance: >3 FB Neck ROM: Full    Dental no notable dental hx. (+) Poor Dentition and Dental Advisory Given   Pulmonary shortness of breath and Long-Term Oxygen Therapy, asthma , sleep apnea , pneumonia -, former smoker,   08-03-13 CXR: vascular congestion, cardiomegaly. breath sounds clear to auscultation  Pulmonary exam normal + decreased breath sounds      Cardiovascular hypertension, Pt. on home beta blockers and Pt. on medications + Peripheral Vascular Disease and +CHF + dysrhythmias Atrial Fibrillation Rhythm:Regular Rate:Normal  ECG reviewed.  ECHO 02-11-13: EF 40-45%. Grade 2 diastolic dysfunction  .Spect 01-25-12: EF 60%. No ischemia.   Neuro/Psych PSYCHIATRIC DISORDERS Anxiety Depression CVA    GI/Hepatic Neg liver ROS, GERD-  Medicated,  Endo/Other  diabetes, Type 1, Insulin DependentMorbid obesity  Renal/GU Renal Insufficiency and CRFRenal diseaseCKD stage 4.   negative genitourinary   Musculoskeletal negative musculoskeletal ROS (+)   Abdominal (+) + obese,   Peds negative pediatric ROS (+)  Hematology negative hematology ROS (+) Blood dyscrasia, anemia ,   Anesthesia Other Findings   Reproductive/Obstetrics negative OB ROS                           Anesthesia Physical  Anesthesia Plan  ASA: IV  Anesthesia Plan: General   Post-op Pain Management:    Induction: Intravenous  Airway Management Planned: LMA  Additional Equipment:   Intra-op Plan:   Post-operative Plan: Extubation in OR  Informed Consent: I have reviewed the patients History and Physical, chart, labs and  discussed the procedure including the risks, benefits and alternatives for the proposed anesthesia with the patient or authorized representative who has indicated his/her understanding and acceptance.   Dental advisory given  Plan Discussed with: CRNA  Anesthesia Plan Comments:         Anesthesia Quick Evaluation

## 2013-10-07 NOTE — Transfer of Care (Signed)
Immediate Anesthesia Transfer of Care Note  Patient: [name]  Procedure(s) Performed: Procedure(s) (LRB): SPLIT THICKNESS SKIN GRAFT RIGHT THIGH TO RIGHT LEG, PLACEMENT OF A CEL AND WOUND VAC TO RIGHT THIGH   (Right)  Patient Location: PACU  Anesthesia Type: General  Level of Consciousness: sedated, patient cooperative and responds to stimulation  Airway & Oxygen Therapy: Patient Spontanous Breathing and Patient connected to face mask oxgen  Post-op Assessment: Report given to PACU RN and Post -op Vital signs reviewed and stable  Post vital signs: Reviewed and stable  Complications: No apparent anesthesia complications

## 2013-10-07 NOTE — Op Note (Signed)
Operative Note   DATE OF OPERATION: 10/07/13  SURGICAL DIVISION: Plastic Surgery  PREOPERATIVE DIAGNOSES:  Open wound of right leg, history nontraumatic hematoma   POSTOPERATIVE DIAGNOSES:  same  PROCEDURE:  Surgical preparation of wound bed for skin grafting 100 cm2, split thickness skin grafting to right leg ( 15 x 13 cm), application A Cell substrate to  Right thigh donor site 100 cm2,  SURGEON: Glenna Fellows MD MBA  ASSISTANT: Shawn Rayburn PA-C  ANESTHESIA:  General.   EBL:  COMPLICATIONS: None.   INDICATIONS FOR PROCEDURE:  The patient, [name], is a 62 y.o. female born on 04-18-1951, is here for treatment of open wound calf incurred while supratherapeautic on Coumadin. Has undergone multiple rounds of A Cell application and now presents with wound suitable for grafting.     DESCRIPTION OF PROCEDURE:  The patient's operative site was marked with the patient in the preoperative area. The patient was taken to the operating room. SCDs were placed and IV antibiotics were given. The patient's operative site was prepped and draped in a sterile fashion. A time out was performed and all information was confirmed to be correct.  We began with preparation of the wound by curreting to remove all slough. Unincorporated A Cell excised with scissors. Wound irrigated and hemostasis obtained. Split thickness skin graft harvested from thigh at 12/1000th inch and meshed to 1:1.5 ratio. Secured to calf wound with both 4-0 chromic sutures and staples. Bolster dressing of adaptic and VAC applied. VAC set to 125 mm Hg continuous.  Donor site dressed with Tisseal and A Cell substrate fenestrated. Surgical lubricant and dry dressing, Ioban applied followed by Kerlix.  The patient was allowed to wake from anesthesia, extubated and taken to the recovery room in satisfactory condition.   SPECIMENS: none  DRAINS: VAC as bolster over grafted area

## 2013-10-08 ENCOUNTER — Ambulatory Visit (INDEPENDENT_AMBULATORY_CARE_PROVIDER_SITE_OTHER): Payer: Medicare HMO | Admitting: Infectious Disease

## 2013-10-08 ENCOUNTER — Encounter (HOSPITAL_COMMUNITY): Payer: Self-pay | Admitting: Plastic Surgery

## 2013-10-08 VITALS — BP 151/54 | HR 62 | Temp 98.2°F

## 2013-10-08 DIAGNOSIS — E1142 Type 2 diabetes mellitus with diabetic polyneuropathy: Secondary | ICD-10-CM

## 2013-10-08 DIAGNOSIS — Z945 Skin transplant status: Secondary | ICD-10-CM

## 2013-10-08 DIAGNOSIS — IMO0002 Reserved for concepts with insufficient information to code with codable children: Secondary | ICD-10-CM

## 2013-10-08 DIAGNOSIS — T889XXS Complication of surgical and medical care, unspecified, sequela: Secondary | ICD-10-CM

## 2013-10-08 DIAGNOSIS — E1149 Type 2 diabetes mellitus with other diabetic neurological complication: Secondary | ICD-10-CM

## 2013-10-08 DIAGNOSIS — T8454XS Infection and inflammatory reaction due to internal left knee prosthesis, sequela: Secondary | ICD-10-CM

## 2013-10-08 DIAGNOSIS — E1140 Type 2 diabetes mellitus with diabetic neuropathy, unspecified: Secondary | ICD-10-CM

## 2013-10-08 DIAGNOSIS — A491 Streptococcal infection, unspecified site: Secondary | ICD-10-CM

## 2013-10-08 DIAGNOSIS — S7011XS Contusion of right thigh, sequela: Secondary | ICD-10-CM

## 2013-10-08 DIAGNOSIS — B951 Streptococcus, group B, as the cause of diseases classified elsewhere: Secondary | ICD-10-CM

## 2013-10-08 DIAGNOSIS — I4891 Unspecified atrial fibrillation: Secondary | ICD-10-CM

## 2013-10-08 DIAGNOSIS — E1059 Type 1 diabetes mellitus with other circulatory complications: Secondary | ICD-10-CM

## 2013-10-08 DIAGNOSIS — M109 Gout, unspecified: Secondary | ICD-10-CM

## 2013-10-08 MED ORDER — LEVOFLOXACIN 250 MG PO TABS: 250.0000 mg | ORAL_TABLET | Freq: Every day | ORAL | Status: DC

## 2013-10-08 NOTE — Progress Notes (Signed)
Subjective:    Patient ID: [name], female    DOB: 04-May-1951, 62 y.o.   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 R to clindamycin. (she also did have gout crystals in the joint. She was on chronic levofloxacin for suppressive therapy. According to a phone note by my partner Dr. Drue Second the patient had been apparently changed from levofloxacin to doxycycline in August 2013 with apparent flareup of her swelling in her knee. Since then she had gone back onto levaquin with improvement in her knee pain.   She had been followed by Dr Renae Fickle and Dr Luiz Blare but apparently has large bill with their practice and claimed this as reason she no longer follows there.   She has been seen at [facility] whre she was told her only option was [facility] and she is not seen there.   wE have continued to follow her on renally dosed levaquin which she had been  taking faithfully.    In late August she was admitted with ? hCAP and knee pain. I saw her on consult service, stopped her abx and asked for Orthopedic consult to consider tapping knee. Allie Bossier saw the pt and attempted tap but got a "dry tap." Pt was dc to SNF and resumed her suppressive levaquin but then has had mx admissions since then, including recent bleed into thigh requiring I and D and now plastic skin graft by Dr. Leta Baptist.   She states that her left knee pain is slightly worse now that she's off antibiotics. On exam she is tender about the knee joint but not with much in the way of an effusion.  Review of Systems  Constitutional: Positive for fatigue. Negative for fever, chills, diaphoresis, activity change, appetite change and unexpected weight change.  HENT: Negative for congestion, rhinorrhea, sinus pressure, sneezing, sore  throat and trouble swallowing.   Eyes: Negative for photophobia and visual disturbance.  Respiratory: Negative for cough, chest tightness, shortness of breath, wheezing and stridor.   Cardiovascular: Negative for chest pain, palpitations and leg swelling.  Gastrointestinal: Negative for nausea, vomiting, abdominal pain, diarrhea, constipation, blood in stool, abdominal distention and anal bleeding.  Genitourinary: Negative for dysuria, hematuria, flank pain and difficulty urinating.  Musculoskeletal: Positive for arthralgias, gait problem, joint swelling and myalgias. Negative for back pain.  Skin: Positive for wound. Negative for color change, pallor and rash.  Neurological: Negative for dizziness, tremors, weakness and light-headedness.  Hematological: Negative for adenopathy. Does not bruise/bleed easily.  Psychiatric/Behavioral: Negative for behavioral problems, confusion, sleep disturbance, dysphoric mood, decreased concentration and agitation.       Objective:   Physical Exam  Constitutional: She is oriented to person, place, and time. She appears well-developed and well-nourished. No distress.  HENT:  Head: Normocephalic and atraumatic.  Mouth/Throat: Oropharynx is clear and moist. No oropharyngeal exudate.  Eyes: Conjunctivae and EOM are normal. Pupils are equal, round, and reactive to light. No scleral icterus.  Neck: Normal range of motion. Neck supple. No JVD present.  Cardiovascular: Normal rate, regular rhythm and normal heart sounds.  Exam reveals no gallop and no friction rub.   No murmur heard. Pulmonary/Chest: Effort normal and breath sounds normal. No respiratory distress. She has no wheezes. She has no rales. She exhibits no tenderness.  Abdominal: She exhibits  no distension and no mass. There is no tenderness. There is no rebound and no guarding.  Musculoskeletal: She exhibits no edema and no tenderness.       Legs: Lymphadenopathy:    She has no cervical adenopathy.    Neurological: She is alert and oriented to person, place, and time. Coordination normal.  Skin: Skin is warm and dry. She is not diaphoretic. No erythema. No pallor.  Psychiatric: She has a normal mood and affect. Her behavior is normal. Judgment and thought content normal.          Assessment & Plan:  Prosthetic joint infection with GBS: The fact that Dr. Rayburn Ma was not able to aspirate any fluid from the knee joint in late August is actually encouraging. She also seems to have done relatively well off antibiotics. However I do have some anxiety about her remaining off antibiotics for some time and for now would like to start her back on her levofloxacin 200 mg daily and continue that for the next several months. I will that point time contemplate stopping antibiotics and see how she does off of them especially given the fact that she did not have an appreciable effusion the last time Dr. Rayburn Ma tried to aspirate the joint.   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 However she seems a done okay on this combination  Hematoma sp I and D and skin graft: will be followed very closely by Plastics

## 2013-10-13 ENCOUNTER — Emergency Department (HOSPITAL_COMMUNITY): Payer: Medicare HMO

## 2013-10-13 ENCOUNTER — Encounter (HOSPITAL_COMMUNITY): Payer: Self-pay | Admitting: Emergency Medicine

## 2013-10-13 ENCOUNTER — Inpatient Hospital Stay (HOSPITAL_COMMUNITY)
Admission: EM | Admit: 2013-10-13 | Discharge: 2013-10-21 | DRG: 291 | Disposition: A | Discharge status: Skilled Nursing Facility | Payer: Medicare HMO | Attending: Internal Medicine | Admitting: Internal Medicine

## 2013-10-13 DIAGNOSIS — Y849 Medical procedure, unspecified as the cause of abnormal reaction of the patient, or of later complication, without mention of misadventure at the time of the procedure: Secondary | ICD-10-CM | POA: Diagnosis not present

## 2013-10-13 DIAGNOSIS — Z9981 Dependence on supplemental oxygen: Secondary | ICD-10-CM

## 2013-10-13 DIAGNOSIS — N179 Acute kidney failure, unspecified: Secondary | ICD-10-CM

## 2013-10-13 DIAGNOSIS — I480 Paroxysmal atrial fibrillation: Secondary | ICD-10-CM | POA: Diagnosis present

## 2013-10-13 DIAGNOSIS — Z91199 Patient's noncompliance with other medical treatment and regimen due to unspecified reason: Secondary | ICD-10-CM

## 2013-10-13 DIAGNOSIS — M109 Gout, unspecified: Secondary | ICD-10-CM | POA: Diagnosis present

## 2013-10-13 DIAGNOSIS — Z87891 Personal history of nicotine dependence: Secondary | ICD-10-CM

## 2013-10-13 DIAGNOSIS — N184 Chronic kidney disease, stage 4 (severe): Secondary | ICD-10-CM

## 2013-10-13 DIAGNOSIS — Z79899 Other long term (current) drug therapy: Secondary | ICD-10-CM

## 2013-10-13 DIAGNOSIS — Z8673 Personal history of transient ischemic attack (TIA), and cerebral infarction without residual deficits: Secondary | ICD-10-CM

## 2013-10-13 DIAGNOSIS — Y831 Surgical operation with implant of artificial internal device as the cause of abnormal reaction of the patient, or of later complication, without mention of misadventure at the time of the procedure: Secondary | ICD-10-CM | POA: Diagnosis present

## 2013-10-13 DIAGNOSIS — E785 Hyperlipidemia, unspecified: Secondary | ICD-10-CM | POA: Diagnosis present

## 2013-10-13 DIAGNOSIS — Z825 Family history of asthma and other chronic lower respiratory diseases: Secondary | ICD-10-CM

## 2013-10-13 DIAGNOSIS — I5042 Chronic combined systolic (congestive) and diastolic (congestive) heart failure: Secondary | ICD-10-CM

## 2013-10-13 DIAGNOSIS — I5031 Acute diastolic (congestive) heart failure: Secondary | ICD-10-CM

## 2013-10-13 DIAGNOSIS — L408 Other psoriasis: Secondary | ICD-10-CM | POA: Diagnosis present

## 2013-10-13 DIAGNOSIS — K219 Gastro-esophageal reflux disease without esophagitis: Secondary | ICD-10-CM | POA: Diagnosis present

## 2013-10-13 DIAGNOSIS — I5041 Acute combined systolic (congestive) and diastolic (congestive) heart failure: Secondary | ICD-10-CM | POA: Diagnosis present

## 2013-10-13 DIAGNOSIS — I129 Hypertensive chronic kidney disease with stage 1 through stage 4 chronic kidney disease, or unspecified chronic kidney disease: Secondary | ICD-10-CM | POA: Diagnosis present

## 2013-10-13 DIAGNOSIS — Z791 Long term (current) use of non-steroidal anti-inflammatories (NSAID): Secondary | ICD-10-CM

## 2013-10-13 DIAGNOSIS — I10 Essential (primary) hypertension: Secondary | ICD-10-CM

## 2013-10-13 DIAGNOSIS — D649 Anemia, unspecified: Secondary | ICD-10-CM

## 2013-10-13 DIAGNOSIS — IMO0002 Reserved for concepts with insufficient information to code with codable children: Secondary | ICD-10-CM | POA: Diagnosis not present

## 2013-10-13 DIAGNOSIS — I6789 Other cerebrovascular disease: Secondary | ICD-10-CM | POA: Diagnosis present

## 2013-10-13 DIAGNOSIS — Z9119 Patient's noncompliance with other medical treatment and regimen: Secondary | ICD-10-CM

## 2013-10-13 DIAGNOSIS — T8459XA Infection and inflammatory reaction due to other internal joint prosthesis, initial encounter: Secondary | ICD-10-CM

## 2013-10-13 DIAGNOSIS — I509 Heart failure, unspecified: Secondary | ICD-10-CM

## 2013-10-13 DIAGNOSIS — D638 Anemia in other chronic diseases classified elsewhere: Secondary | ICD-10-CM

## 2013-10-13 DIAGNOSIS — T8450XA Infection and inflammatory reaction due to unspecified internal joint prosthesis, initial encounter: Secondary | ICD-10-CM | POA: Diagnosis present

## 2013-10-13 DIAGNOSIS — E119 Type 2 diabetes mellitus without complications: Secondary | ICD-10-CM

## 2013-10-13 DIAGNOSIS — R233 Spontaneous ecchymoses: Secondary | ICD-10-CM

## 2013-10-13 DIAGNOSIS — R0602 Shortness of breath: Secondary | ICD-10-CM

## 2013-10-13 DIAGNOSIS — J45909 Unspecified asthma, uncomplicated: Secondary | ICD-10-CM | POA: Diagnosis present

## 2013-10-13 DIAGNOSIS — E1129 Type 2 diabetes mellitus with other diabetic kidney complication: Secondary | ICD-10-CM | POA: Diagnosis present

## 2013-10-13 DIAGNOSIS — T8459XD Infection and inflammatory reaction due to other internal joint prosthesis, subsequent encounter: Secondary | ICD-10-CM

## 2013-10-13 DIAGNOSIS — T888XXA Other specified complications of surgical and medical care, not elsewhere classified, initial encounter: Secondary | ICD-10-CM

## 2013-10-13 DIAGNOSIS — I4891 Unspecified atrial fibrillation: Secondary | ICD-10-CM

## 2013-10-13 DIAGNOSIS — G4733 Obstructive sleep apnea (adult) (pediatric): Secondary | ICD-10-CM

## 2013-10-13 DIAGNOSIS — J189 Pneumonia, unspecified organism: Secondary | ICD-10-CM | POA: Diagnosis present

## 2013-10-13 DIAGNOSIS — Z7901 Long term (current) use of anticoagulants: Secondary | ICD-10-CM

## 2013-10-13 DIAGNOSIS — L409 Psoriasis, unspecified: Secondary | ICD-10-CM

## 2013-10-13 DIAGNOSIS — K5900 Constipation, unspecified: Secondary | ICD-10-CM | POA: Diagnosis present

## 2013-10-13 DIAGNOSIS — IMO0001 Reserved for inherently not codable concepts without codable children: Secondary | ICD-10-CM | POA: Diagnosis present

## 2013-10-13 DIAGNOSIS — I5043 Acute on chronic combined systolic (congestive) and diastolic (congestive) heart failure: Principal | ICD-10-CM | POA: Diagnosis present

## 2013-10-13 DIAGNOSIS — N189 Chronic kidney disease, unspecified: Secondary | ICD-10-CM

## 2013-10-13 DIAGNOSIS — Z794 Long term (current) use of insulin: Secondary | ICD-10-CM

## 2013-10-13 DIAGNOSIS — Z7982 Long term (current) use of aspirin: Secondary | ICD-10-CM

## 2013-10-13 DIAGNOSIS — Y9210 Unspecified residential institution as the place of occurrence of the external cause: Secondary | ICD-10-CM | POA: Diagnosis not present

## 2013-10-13 DIAGNOSIS — J45901 Unspecified asthma with (acute) exacerbation: Secondary | ICD-10-CM

## 2013-10-13 DIAGNOSIS — Z96659 Presence of unspecified artificial knee joint: Secondary | ICD-10-CM

## 2013-10-13 LAB — CBC WITH DIFFERENTIAL/PLATELET
Basophils Absolute: 0 10*3/uL (ref 0.0–0.1)
Basophils Absolute: 0 10*3/uL (ref 0.0–0.1)
Eosinophils Absolute: 0.2 10*3/uL (ref 0.0–0.7)
HCT: 23.2 % — ABNORMAL LOW (ref 36.0–46.0)
HCT: 23.5 % — ABNORMAL LOW (ref 36.0–46.0)
Hemoglobin: 7.2 g/dL — ABNORMAL LOW (ref 12.0–15.0)
Lymphocytes Relative: 11 % — ABNORMAL LOW (ref 12–46)
Lymphocytes Relative: 10 % — ABNORMAL LOW (ref 12–46)
Lymphs Abs: 0.8 10*3/uL (ref 0.7–4.0)
MCH: 26.9 pg (ref 26.0–34.0)
Monocytes Absolute: 0.6 10*3/uL (ref 0.1–1.0)
Neutro Abs: 5.7 10*3/uL (ref 1.7–7.7)
Neutro Abs: 6.1 10*3/uL (ref 1.7–7.7)
Neutrophils Relative %: 80 % — ABNORMAL HIGH (ref 43–77)
Platelets: 272 10*3/uL (ref 150–400)
Platelets: 255 10*3/uL (ref 150–400)
RBC: 2.68 MIL/uL — ABNORMAL LOW (ref 3.87–5.11)
RDW: 17.5 % — ABNORMAL HIGH (ref 11.5–15.5)
RDW: 17.7 % — ABNORMAL HIGH (ref 11.5–15.5)
WBC: 7.1 10*3/uL (ref 4.0–10.5)
WBC: 7.7 10*3/uL (ref 4.0–10.5)

## 2013-10-13 LAB — PREPARE RBC (CROSSMATCH)

## 2013-10-13 LAB — COMPREHENSIVE METABOLIC PANEL
ALT: 6 U/L (ref 0–35)
Alkaline Phosphatase: 111 U/L (ref 39–117)
BUN: 35 mg/dL — ABNORMAL HIGH (ref 6–23)
CO2: 27 mEq/L (ref 19–32)
Chloride: 108 mEq/L (ref 96–112)
GFR calc Af Amer: 25 mL/min — ABNORMAL LOW (ref >90)
GFR calc non Af Amer: 21 mL/min — ABNORMAL LOW (ref >90)
Glucose, Bld: 131 mg/dL — ABNORMAL HIGH (ref 70–99)
Potassium: 4.2 mEq/L (ref 3.5–5.1)
Sodium: 145 mEq/L (ref 135–145)
Total Bilirubin: 0.2 mg/dL — ABNORMAL LOW (ref 0.3–1.2)
Total Protein: 6.7 g/dL (ref 6.0–8.3)

## 2013-10-13 LAB — URINALYSIS, ROUTINE W REFLEX MICROSCOPIC
Glucose, UA: NEGATIVE mg/dL
Ketones, ur: NEGATIVE mg/dL
Leukocytes, UA: NEGATIVE
Nitrite: NEGATIVE
Protein, ur: NEGATIVE mg/dL

## 2013-10-13 LAB — BASIC METABOLIC PANEL
BUN: 35 mg/dL — ABNORMAL HIGH (ref 6–23)
CO2: 27 mEq/L (ref 19–32)
Chloride: 108 mEq/L (ref 96–112)
Glucose, Bld: 83 mg/dL (ref 70–99)
Potassium: 4.1 mEq/L (ref 3.5–5.1)
Sodium: 142 mEq/L (ref 135–145)

## 2013-10-13 LAB — PROTIME-INR
INR: 1.06 (ref 0.00–1.49)
Prothrombin Time: 13.6 seconds (ref 11.6–15.2)

## 2013-10-13 LAB — APTT: aPTT: 34 seconds (ref 24–37)

## 2013-10-13 LAB — PRO B NATRIURETIC PEPTIDE: Pro B Natriuretic peptide (BNP): 2427 pg/mL — ABNORMAL HIGH (ref 0–125)

## 2013-10-13 LAB — TROPONIN I: Troponin I: <0.3 ng/mL (ref <0.30)

## 2013-10-13 MED ORDER — ONDANSETRON HCL 4 MG PO TABS: 4.0000 mg | ORAL_TABLET | Freq: Four times a day (QID) | ORAL | Status: DC | PRN

## 2013-10-13 MED ORDER — FUROSEMIDE 40 MG PO TABS
40.0000 mg | ORAL_TABLET | Freq: Every day | ORAL | Status: DC
  Administered 2013-10-14: 09:00:00 40 mg via ORAL
  Filled 2013-10-13 (×2): qty 1

## 2013-10-13 MED ORDER — FERROUS SULFATE 325 (65 FE) MG PO TABS
325.0000 mg | ORAL_TABLET | Freq: Three times a day (TID) | ORAL | Status: DC
  Administered 2013-10-14 – 2013-10-21 (×21): 325 mg via ORAL
  Filled 2013-10-13 (×25): qty 1

## 2013-10-13 MED ORDER — PANTOPRAZOLE SODIUM 40 MG PO TBEC
40.0000 mg | DELAYED_RELEASE_TABLET | Freq: Every day | ORAL | Status: DC
  Administered 2013-10-14 – 2013-10-21 (×8): 40 mg via ORAL
  Filled 2013-10-13 (×8): qty 1

## 2013-10-13 MED ORDER — AMIODARONE HCL 200 MG PO TABS
200.0000 mg | ORAL_TABLET | Freq: Every day | ORAL | Status: DC
  Administered 2013-10-14 – 2013-10-21 (×8): 200 mg via ORAL
  Filled 2013-10-13 (×9): qty 1

## 2013-10-13 MED ORDER — ATORVASTATIN CALCIUM 20 MG PO TABS
20.0000 mg | ORAL_TABLET | ORAL | Status: DC
  Administered 2013-10-14 – 2013-10-20 (×8): 20 mg via ORAL
  Filled 2013-10-13 (×9): qty 1

## 2013-10-13 MED ORDER — AMLODIPINE BESYLATE 10 MG PO TABS
10.0000 mg | ORAL_TABLET | Freq: Every day | ORAL | Status: DC
  Administered 2013-10-14 – 2013-10-21 (×8): 10 mg via ORAL
  Filled 2013-10-13 (×9): qty 1

## 2013-10-13 MED ORDER — ALBUTEROL SULFATE HFA 108 (90 BASE) MCG/ACT IN AERS
2.0000 | INHALATION_SPRAY | Freq: Four times a day (QID) | RESPIRATORY_TRACT | Status: DC | PRN
  Filled 2013-10-13: qty 6.7

## 2013-10-13 MED ORDER — HYDRALAZINE HCL 50 MG PO TABS
50.0000 mg | ORAL_TABLET | ORAL | Status: DC
  Administered 2013-10-14 – 2013-10-17 (×13): 50 mg via ORAL
  Filled 2013-10-13 (×17): qty 1

## 2013-10-13 MED ORDER — FUROSEMIDE 10 MG/ML IJ SOLN
40.0000 mg | Freq: Once | INTRAMUSCULAR | Status: AC
  Administered 2013-10-13: 40 mg via INTRAVENOUS
  Filled 2013-10-13: qty 4

## 2013-10-13 MED ORDER — SODIUM CHLORIDE 0.9 % IV SOLN
INTRAVENOUS | Status: DC
  Administered 2013-10-14 – 2013-10-20 (×3): via INTRAVENOUS

## 2013-10-13 MED ORDER — OXYCODONE-ACETAMINOPHEN 5-325 MG PO TABS
1.0000 | ORAL_TABLET | ORAL | Status: DC | PRN
  Administered 2013-10-14 – 2013-10-15 (×2): 2 via ORAL
  Administered 2013-10-18: 1 via ORAL
  Administered 2013-10-19: 2 via ORAL
  Filled 2013-10-13 (×4): qty 2

## 2013-10-13 MED ORDER — INSULIN GLARGINE 100 UNIT/ML ~~LOC~~ SOLN
20.0000 [IU] | SUBCUTANEOUS | Status: DC
  Administered 2013-10-14 – 2013-10-16 (×5): 20 [IU] via SUBCUTANEOUS
  Filled 2013-10-13 (×6): qty 0.2

## 2013-10-13 MED ORDER — ACETAMINOPHEN 650 MG RE SUPP: 650.0000 mg | Freq: Four times a day (QID) | RECTAL | Status: DC | PRN

## 2013-10-13 MED ORDER — ACETAMINOPHEN 325 MG PO TABS: 650.0000 mg | ORAL_TABLET | Freq: Four times a day (QID) | ORAL | Status: DC | PRN

## 2013-10-13 MED ORDER — ASPIRIN 325 MG PO TABS
325.0000 mg | ORAL_TABLET | Freq: Every day | ORAL | Status: DC
  Administered 2013-10-14 – 2013-10-21 (×8): 325 mg via ORAL
  Filled 2013-10-13 (×9): qty 1

## 2013-10-13 MED ORDER — METOPROLOL TARTRATE 12.5 MG HALF TABLET
12.5000 mg | ORAL_TABLET | ORAL | Status: DC
  Administered 2013-10-14 – 2013-10-21 (×15): 12.5 mg via ORAL
  Filled 2013-10-13 (×19): qty 1

## 2013-10-13 MED ORDER — ONDANSETRON HCL 4 MG/2ML IJ SOLN
4.0000 mg | Freq: Four times a day (QID) | INTRAMUSCULAR | Status: DC | PRN
  Administered 2013-10-15 – 2013-10-19 (×2): 4 mg via INTRAVENOUS
  Filled 2013-10-13 (×3): qty 2

## 2013-10-13 MED ORDER — ZOLPIDEM TARTRATE 5 MG PO TABS
5.0000 mg | ORAL_TABLET | Freq: Every evening | ORAL | Status: DC | PRN
  Administered 2013-10-14 – 2013-10-20 (×8): 5 mg via ORAL
  Filled 2013-10-13 (×8): qty 1

## 2013-10-13 MED ORDER — ALLOPURINOL 100 MG PO TABS
100.0000 mg | ORAL_TABLET | Freq: Every day | ORAL | Status: DC
  Administered 2013-10-14 – 2013-10-21 (×8): 100 mg via ORAL
  Filled 2013-10-13 (×9): qty 1

## 2013-10-13 NOTE — H&P (Signed)
[facility] History and Physical  [name] QMV:[id number] DOB: 11-May-1951 DOA: 10/13/2013  Referring physician: PCP: [name] [name], MD  Specialists:   Chief Complaint: Symptomatic anemia (SOB)  HPI: [name] noncompliant BF PMHx psoriasis, HLD, OSA, HTN, A. fib with RVR, CHF combined systolic and diastolic, diabetes, chronic anticoagulations, CKD. stage IV, asthma.  Patient sent to the ER from nursing home for evaluation of anemia. Patient reportedly had hemoglobin of 6.8. Patient has complex recent past medical history. She had developed a large calf hematoma secondary to anticoagulation which resulted in tissue necrosis requiring skin grafting. Patient has been off anticoagulation. Hemoglobin found to be low, sent to the ER. Patient does report that she has been feeling short of breath. She has not had chest pain or heart palpitations. No passing out. Patient denies any obvious blood loss other than small amount of bleeding on the dressing of her graft area. She does report moderate pain in the area of the graft. CURRENTLY negative CP/nausea, negative blood in stool, positive SOB with conversation while on 2 L nasal cannula. States does not use her CPAP machine secondary to not having all the parts. States not on O2 normally during the day.   Review of Systems: The patient denies anorexia, fever, weight loss,, vision loss, decreased hearing, hoarseness, chest pain, syncope,  balance deficits, hemoptysis, abdominal pain, melena, hematochezia, severe indigestion/heartburn, hematuria, incontinence, genital sores, muscle weakness, suspicious skin lesions, transient blindness, difficulty walking, depression, unusual weight change, enlarged lymph nodes, angioedema, and breast masses.    TRAVEL HISTORY: None   Procedure 10/07/2013 below procedure performed by Dr. Glenna Fellows Surgical preparation of wound bed for skin grafting 100 cm2, split thickness skin grafting to right leg  ( 15 x 13 cm), application A Cell substrate to Right thigh donor site 100 cm2,  Echocardiogram 02/11/2013 - Left ventricle: The cavity size was normal. Wall thickness was increased in a pattern of moderate LVH. -Systolic function was mildly to moderately reduced.  -LVEF= 40% to 45%. - (grade 2 diastolic dysfunction). - Aortic valve: Mild regurgitation. - Right ventricle: The cavity size was moderately dilated. - Right atrium: The atrium was moderately dilated. - Pulmonary arteries: PA peak pressure: 42mm Hg (S).    Past Medical History  Diagnosis Date  . Asthma   . Bronchitis   . Allergic rhinitis   . HTN (hypertension)   . Hyperlipidemia   . Obesity   . OSA (obstructive sleep apnea)     poor compliance with cpap  . CHF (congestive heart failure)   . Biventricular failure     compensated  . Psoriasis   . Stasis edema     of lower extremities  . Depression   . Situational stress   . Anemia   . Gout   . Chronic anticoagulation   . Yeast infection involving the vagina and surrounding area   . Atrial fibrillation with RVR   . GERD (gastroesophageal reflux disease)   . Chronic bronchitis     "get it q yr" (08/10/2013)  . Pneumonia     "said I did on 08/03/2013 then they ruled it out" (08/10/2013)  . Shortness of breath     "all the time" (08/10/2013)  . On home oxygen therapy     "2L during the night and prn during the day" (08/10/2013)  . Type II diabetes mellitus   . History of blood transfusion     "few during my lifetime; last time was 4 days ago  when I got 2 units" (08/10/2013)  . Stroke ?2007    denies residuals on 08/10/2013  . DJD (degenerative joint disease)   . Bleeding     "from my skin folds; just won't stop" (08/10/2013)  . Complication of anesthesia     "they gave me too much; I stayed out of it for awhile" (08/10/2013)  . Anxiety   . Kidney stones   . Kidney disease, chronic, stage III (GFR 30-59 ml/min)   . Chronic kidney disease, stage IV (severe)   . Morbid  obesity    Past Surgical History  Procedure Laterality Date  . Appendectomy    . Cholecystectomy    . Total knee arthroplasty Left ~ 2006  . Total abdominal hysterectomy    . Back surgery    . Cystectomy Left     hand  . Tonsillectomy    . Joint replacement    . Lumbar disc surgery    . Colonoscopy N/A 08/16/2013    Procedure: COLONOSCOPY;  Surgeon: Theda Belfast, MD;  Location: [facility] ENDOSCOPY;  Service: Endoscopy;  Laterality: N/A;  . Hematoma evacuation Right 09/10/2013    Procedure: EVACUATION OF RIGHT LEG HEMATOMA AND EXCISION DEBRIDIMENT RIGHT LEG ;  Surgeon: Robyne Askew, MD;  Location: [facility] ORS;  Service: General;  Laterality: Right;  . Debridement and closure wound Right 09/22/2013    Procedure: IRRIGATION AND DEBRIDEMENT SURGICAL PREP PLACEMENT OF ACELL AND VAC  ;  Surgeon: Glenna Fellows, MD;  Location: [facility]S;  Service: Plastics;  Laterality: Right;  . Skin split graft Right 10/07/2013    Procedure: SPLIT THICKNESS SKIN GRAFT RIGHT THIGH TO RIGHT LEG, PLACEMENT OF A CEL AND WOUND VAC TO RIGHT THIGH  ;  Surgeon: Glenna Fellows, MD;  Location: [facility];  Service: Plastics;  Laterality: Right;   Social History:  reports that she quit smoking about 31 years ago. Her smoking use included Cigarettes. She has a 1.44 pack-year smoking history. She has never used smokeless tobacco. She reports that she does not drink alcohol or use illicit drugs.    Allergies  Allergen Reactions  . Daptomycin Other (See Comments)    Elevated CPK  . Morphine And Related Nausea And Vomiting  . Cephalexin Rash    unknown  . Celecoxib     unknown  . Codeine     unknown  . Fluoxetine Hcl     unknown  . Latex     REACTION: undefined  . Ofloxacin     unknown  . Penicillins     unknown  . Rofecoxib     unknown  . Sulfonamide Derivatives     unknown  . Tramadol Nausea And Vomiting    Family History  Problem Relation Age of Onset  . Heart attack Father   . Asthma Father   . Heart  disease Paternal Uncle   . Rectal cancer Paternal Aunt   . Other Mother     mva    Prior to Admission medications   Medication Sig Start Date End Date Taking? Authorizing Provider  albuterol (PROVENTIL HFA;VENTOLIN HFA) 108 (90 BASE) MCG/ACT inhaler Inhale 2 puffs into the lungs every 6 (six) hours as needed for wheezing or shortness of breath.    Yes Historical Provider, MD  allopurinol (ZYLOPRIM) 100 MG tablet Take 100 mg by mouth daily. Patient takes at 0800am daily 09/16/11  Yes Historical Provider, MD  amiodarone (PACERONE) 200 MG tablet Take 200 mg by mouth daily. Patient takes  at 0800am daily   Yes Historical Provider, MD  amLODipine (NORVASC) 10 MG tablet Take 10 mg by mouth daily. Patient takes at 0800am daily 08/19/13  Yes Leroy Sea, MD  aspirin 325 MG tablet Take 325 mg by mouth daily. Patient takes at 0800am daily   Yes Historical Provider, MD  atorvastatin (LIPITOR) 20 MG tablet Take 20 mg by mouth at bedtime. Patient takes at 2100   Yes Historical Provider, MD  ferrous sulfate 325 (65 FE) MG tablet Take 325 mg by mouth 3 (three) times daily with meals. Patient takes at 0800, 1200, and 1700 08/29/12  Yes Gwenyth Bender, MD  furosemide (LASIX) 40 MG tablet Take 40 mg by mouth daily. Patient takes at 0800am daily 08/19/13  Yes Leroy Sea, MD  hydrALAZINE (APRESOLINE) 50 MG tablet Take 50 mg by mouth 3 (three) times daily. Patient takes at 0900am, 1400, and 2100   Yes Historical Provider, MD  insulin glargine (LANTUS) 100 UNIT/ML injection Inject 20 Units into the skin 2 (two) times daily. Patient takes at 0800am and 1600   Yes Historical Provider, MD  levofloxacin (LEVAQUIN) 250 MG tablet Take 1 tablet (250 mg total) by mouth daily. Patient takes at 0800 am daily 10/08/13  Yes Randall Hiss, MD  metoprolol tartrate (LOPRESSOR) 25 MG tablet Take 12.5 mg by mouth 2 (two) times daily. Patient takes  At 0800am and 1600 09/22/13  Yes Rhetta Mura, MD  ondansetron (ZOFRAN)  4 MG tablet Take 4 mg by mouth every 6 (six) hours as needed for nausea. 08/08/13  Yes Jeralyn Bennett, MD  oxyCODONE-acetaminophen (PERCOCET/ROXICET) 5-325 MG per tablet Take 1-2 tablets by mouth every 4 (four) hours as needed for moderate pain or severe pain (1 tablet for pain rated 1 through 5.  2 tablets for pain rated 6 to 10.). 09/22/13  Yes Rhetta Mura, MD  pantoprazole (PROTONIX) 40 MG tablet Take 40 mg by mouth daily.   Yes Historical Provider, MD  zolpidem (AMBIEN) 5 MG tablet Take 5 mg by mouth at bedtime as needed for sleep.    Yes Historical Provider, MD   Physical Exam: Filed Vitals:   10/13/13 1407 10/13/13 1418 10/13/13 1429 10/13/13 1903  BP:  167/53  154/42  Pulse:  60  59  Temp:  98.1 F (36.7 C)  98.4 F (36.9 C)  TempSrc:  Oral  Oral  Resp:  25  18  Height:    5\' 3"  (1.6 m)  Weight:    127 kg (279 lb 15.8 oz)  SpO2: 96% 94% 96% 98%     General:  A./O. x4, NAD,  Eyes: Pupils equal reactive to light and accommodation  Neck: Negative JVD  Cardiovascular: Regular Larrey, negative murmurs rubs or gallops, DP/PT pulse one plus bilateral  Respiratory: Clear to auscultation bilateral  Abdomen: Morbidly obese, nontender, plus bowel side  Skin: Right lateral thigh skin graft harvest site, covered and clean, negative warmth to touch, negative erythema (did not take down the dressing); right lower leg split thickness skin graft site covered and clean, negative warmth to touch, negative erythema (did not take down the dressing); right lower extremity severe cirrhosis, cirrhotic plaques on bilateral elbows   Musculoskeletal: Bilateral pedal edema on top of morbid obesity and psoriasis  Neurologic: Cranial nerves II through XII intact, able to move all extremities to command, all extremity strength 5/5, sensation intact throughout. Did not ambulate patient  Labs on Admission:  Basic Metabolic Panel:  Recent Labs  Lab 10/07/13 0545 10/13/13 1503  NA 145 142   K 3.9 4.1  CL 109 108  CO2 25 27  GLUCOSE 92 83  BUN 36* 35*  CREATININE 1.91* 2.52*  CALCIUM 8.9 9.1   Liver Function Tests: No results found for this basename: AST, ALT, ALKPHOS, BILITOT, PROT, ALBUMIN,  in the last 168 hours No results found for this basename: LIPASE, AMYLASE,  in the last 168 hours No results found for this basename: AMMONIA,  in the last 168 hours CBC:  Recent Labs Lab 10/07/13 0545 10/13/13 1503  WBC 9.1 7.1  NEUTROABS  --  5.7  HGB 8.1* 7.1*  HCT 27.1* 23.2*  MCV 88.6 88.9  PLT 258 272   Cardiac Enzymes:  Recent Labs Lab 10/13/13 1503  TROPONINI <0.30    BNP (last 3 results)  Recent Labs  07/05/13 1536 08/03/13 1122 10/13/13 1503  PROBNP 1581.0* 1789.0* 2427.0*   CBG:  Recent Labs Lab 10/07/13 0549 10/07/13 0921 10/07/13 1137  GLUCAP 86 103* 109*    Radiological Exams on Admission: Dg Chest Port 1 View  10/13/2013   CLINICAL DATA:  Shortness of breath  EXAM: PORTABLE CHEST - 1 VIEW  COMPARISON:  10/07/2013  FINDINGS: Cardiac shadow remains enlarged. Increased vascular congestion and mild pulmonary edema is noted. No focal confluent infiltrate is seen. No bony abnormality is noted.  IMPRESSION: Changes consistent with CHF.   Electronically Signed   By: Alcide Clever M.D.   On: 10/13/2013 16:43    EKG: Repeat EKG pending  Assessment/Plan Active Problems:   HYPERLIPIDEMIA   OBSTRUCTIVE SLEEP APNEA   HYPERTENSION   ATRIAL FIBRILLATION WITH RAPID VENTRICULAR RESPONSE   Acute combined systolic and diastolic heart failure   C V A / STROKE   DM (diabetes mellitus)   Chronic anticoagulation   CKD (chronic kidney disease), stage IV   Psoriasis  CHF systolic/diastolic; continue amlodipine, furosemide, hydralazine, metoprolol.  -Patient to receive extra dose of Lasix 40 mg in between the units of PRBC -Obtain troponin x3  -Obtain EKG   Symptomatic anemia; patient to receive 2 units of PRBC. Patient received Lasix 40 mg between  units -we'll add Zaroxolyn for a.m. dose of Lasix  A. fib with RVR; continue patient on home medications of amiodarone, currently in NSR  OSA; respiratory to provide CPAP machine  HTN; not within AHA guidelines; currently patient on medication consistent with CHF however still hypertensive. We'll consider changing patient's metoprolol to labetalol which is a better choice for a uncontrolled diabetic,   Diabetes type 2; continue home dose of Lantus. Will add SSI if required  CKD stage IV; patient close to baseline, creatinine ranges between 1.78-3.0  Psoriasis; will address after patient stable  Chronic anticoagulant; will hold all anticoagulants secondary to patient's recent right lower extremity hematoma and surgery requiring skin graft     Code Status: Full  Family Communication:  Disposition Plan:   Time spent: 120 minute  WOODS, Roselind Messier [facility] Pager [phone]  If 7PM-7AM, please contact night-coverage [email or web address] Password [facility] 10/13/2013, 8:33 PM

## 2013-10-13 NOTE — ED Provider Notes (Signed)
CSN: [id number]     Arrival date & time 10/13/13  1404 History   First MD Initiated Contact with Patient 10/13/13 1459     Chief Complaint  Patient presents with  . low hemoglobin    (Consider location/radiation/quality/duration/timing/severity/associated sxs/prior Treatment) HPI Comments: Patient sent to the ER from nursing home for evaluation of anemia. Patient reportedly had hemoglobin of 6.8. Patient has complex recent past medical history. She had developed a large calf hematoma secondary to anticoagulation which resulted in tissue necrosis requiring skin grafting. Patient has been off anticoagulation. Hemoglobin found to be low, sent to the ER. Patient does report that she has been feeling short of breath. She has not had chest pain or heart palpitations. No passing out. Patient denies any obvious blood loss other than small amount of bleeding on the dressing of her graft area. She does report moderate pain in the area of the graft.   Past Medical History  Diagnosis Date  . Asthma   . Bronchitis   . Allergic rhinitis   . HTN (hypertension)   . Hyperlipidemia   . Obesity   . OSA (obstructive sleep apnea)     poor compliance with cpap  . CHF (congestive heart failure)   . Biventricular failure     compensated  . Psoriasis   . Stasis edema     of lower extremities  . Depression   . Situational stress   . Anemia   . Gout   . Chronic anticoagulation   . Yeast infection involving the vagina and surrounding area   . Atrial fibrillation with RVR   . GERD (gastroesophageal reflux disease)   . Chronic bronchitis     "get it q yr" (08/10/2013)  . Pneumonia     "said I did on 08/03/2013 then they ruled it out" (08/10/2013)  . Shortness of breath     "all the time" (08/10/2013)  . On home oxygen therapy     "2L during the night and prn during the day" (08/10/2013)  . Type II diabetes mellitus   . History of blood transfusion     "few during my lifetime; last time was 4 days ago when I  got 2 units" (08/10/2013)  . Stroke ?2007    denies residuals on 08/10/2013  . DJD (degenerative joint disease)   . Bleeding     "from my skin folds; just won't stop" (08/10/2013)  . Complication of anesthesia     "they gave me too much; I stayed out of it for awhile" (08/10/2013)  . Anxiety   . Kidney stones   . Kidney disease, chronic, stage III (GFR 30-59 ml/min)   . Chronic kidney disease, stage IV (severe)   . Morbid obesity    Past Surgical History  Procedure Laterality Date  . Appendectomy    . Cholecystectomy    . Total knee arthroplasty Left ~ 2006  . Total abdominal hysterectomy    . Back surgery    . Cystectomy Left     hand  . Tonsillectomy    . Joint replacement    . Lumbar disc surgery    . Colonoscopy N/A 08/16/2013    Procedure: COLONOSCOPY;  Surgeon: Theda Belfast, MD;  [facility] ENDOSCOPY;  Service: Endoscopy;  Laterality: N/A;  . Hematoma evacuation Right 09/10/2013    Procedure: EVACUATION OF RIGHT LEG HEMATOMA AND EXCISION DEBRIDIMENT RIGHT LEG ;  Surgeon: Robyne Askew, MD;  Location: [facility]S;  Service: General;  Laterality:  Right;  . Debridement and closure wound Right 09/22/2013    Procedure: IRRIGATION AND DEBRIDEMENT SURGICAL PREP PLACEMENT OF ACELL AND VAC  ;  Surgeon: Glenna Fellows, MD;  Location: [facility]S;  Service: Plastics;  Laterality: Right;  . Skin split graft Right 10/07/2013    Procedure: SPLIT THICKNESS SKIN GRAFT RIGHT THIGH TO RIGHT LEG, PLACEMENT OF A CEL AND WOUND VAC TO RIGHT THIGH  ;  Surgeon: Glenna Fellows, MD;  Location: [facility] ORS;  Service: Plastics;  Laterality: Right;   Family History  Problem Relation Age of Onset  . Heart attack Father   . Asthma Father   . Heart disease Paternal Uncle   . Rectal cancer Paternal Aunt   . Other Mother     mva   History  Substance Use Topics  . Smoking status: Former Smoker -- 0.12 packs/day for 12 years    Types: Cigarettes    Quit date: 07/05/1982  . Smokeless tobacco: Never Used      Comment: smoked for 1.5 yrs about 2 cigs a day ,only if stressed  . Alcohol Use: No   OB History   Grav Para Term Preterm Abortions TAB SAB Ect Mult Living                 Review of Systems  Respiratory: Positive for shortness of breath.   Skin: Positive for wound.  All other systems reviewed and are negative.    Allergies  Daptomycin; Morphine and related; Cephalexin; Celecoxib; Codeine; Fluoxetine hcl; Latex; Ofloxacin; Penicillins; Rofecoxib; Sulfonamide derivatives; and Tramadol  Home Medications   Current Outpatient Rx  Name  Route  Sig  Dispense  Refill  . albuterol (PROVENTIL HFA;VENTOLIN HFA) 108 (90 BASE) MCG/ACT inhaler   Inhalation   Inhale 2 puffs into the lungs every 6 (six) hours as needed for wheezing or shortness of breath.          . allopurinol (ZYLOPRIM) 100 MG tablet   Oral   Take 100 mg by mouth daily. Patient takes at 0800am daily         . amiodarone (PACERONE) 200 MG tablet   Oral   Take 200 mg by mouth daily. Patient takes at 0800am daily         . amLODipine (NORVASC) 10 MG tablet   Oral   Take 10 mg by mouth daily. Patient takes at 0800am daily         . aspirin 325 MG tablet   Oral   Take 325 mg by mouth daily. Patient takes at 0800am daily         . atorvastatin (LIPITOR) 20 MG tablet   Oral   Take 20 mg by mouth at bedtime. Patient takes at 2100         . ferrous sulfate 325 (65 FE) MG tablet   Oral   Take 325 mg by mouth 3 (three) times daily with meals. Patient takes at 0800, 1200, and 1700         . furosemide (LASIX) 40 MG tablet   Oral   Take 40 mg by mouth daily. Patient takes at 0800am daily         . hydrALAZINE (APRESOLINE) 50 MG tablet   Oral   Take 50 mg by mouth 3 (three) times daily. Patient takes at 0900am, 1400, and 2100         . insulin glargine (LANTUS) 100 UNIT/ML injection   Subcutaneous   Inject 20 Units into the skin  2 (two) times daily. Patient takes at 0800am and 1600         .  levofloxacin (LEVAQUIN) 250 MG tablet   Oral   Take 1 tablet (250 mg total) by mouth daily. Patient takes at 0800 am daily   30 tablet   11   . metoprolol tartrate (LOPRESSOR) 25 MG tablet   Oral   Take 12.5 mg by mouth 2 (two) times daily. Patient takes  At 0800am and 1600         . ondansetron (ZOFRAN) 4 MG tablet   Oral   Take 4 mg by mouth every 6 (six) hours as needed for nausea.         [name] oxyCODONE-acetaminophen (PERCOCET/ROXICET) 5-325 MG per tablet   Oral   Take 1-2 tablets by mouth every 4 (four) hours as needed for moderate pain or severe pain (1 tablet for pain rated 1 through 5.  2 tablets for pain rated 6 to 10.).         [name] pantoprazole (PROTONIX) 40 MG tablet   Oral   Take 40 mg by mouth daily.         [name] zolpidem (AMBIEN) 5 MG tablet   Oral   Take 5 mg by mouth at bedtime as needed for sleep.           BP 167/53  Pulse 60  Temp(Src) 98.1 F (36.7 C) (Oral)  Resp 25  SpO2 96% Physical Exam  Constitutional: She is oriented to person, place, and time. She appears well-developed and well-nourished. No distress.  HENT:  Head: Normocephalic and atraumatic.  Right Ear: Hearing normal.  Left Ear: Hearing normal.  Nose: Nose normal.  Mouth/Throat: Oropharynx is clear and moist and mucous membranes are normal.  Eyes: Conjunctivae and EOM are normal. Pupils are equal, round, and reactive to light.  Neck: Normal range of motion. Neck supple.  Cardiovascular: Regular rhythm, S1 normal and S2 normal.  Exam reveals no gallop and no friction rub.   No murmur heard. Pulmonary/Chest: Effort normal and breath sounds normal. No respiratory distress. She exhibits no tenderness.  Abdominal: Soft. Normal appearance and bowel sounds are normal. There is no hepatosplenomegaly. There is no tenderness. There is no rebound, no guarding, no tenderness at McBurney's point and negative Murphy's sign. No hernia.  Musculoskeletal: Normal range of motion.        Legs: Neurological: She is alert and oriented to person, place, and time. She has normal strength. No cranial nerve deficit or sensory deficit. Coordination normal. GCS eye subscore is 4. GCS verbal subscore is 5. GCS motor subscore is 6.  Skin: Skin is warm, dry and intact. No rash noted. No cyanosis.  Psychiatric: She has a normal mood and affect. Her speech is normal and behavior is normal. Thought content normal.    ED Course  Procedures (including critical care time) Labs Review Labs Reviewed  CBC WITH DIFFERENTIAL - Abnormal; Notable for the following:    RBC 2.61 (*)    Hemoglobin 7.1 (*)    HCT 23.2 (*)    RDW 17.5 (*)    Neutrophils Relative % 80 (*)    Lymphocytes Relative 11 (*)    All other components within normal limits  BASIC METABOLIC PANEL - Abnormal; Notable for the following:    BUN 35 (*)    Creatinine, Ser 2.52 (*)    GFR calc non Af Amer 19 (*)    GFR calc Af Amer 22 (*)  All other components within normal limits  PRO B NATRIURETIC PEPTIDE - Abnormal; Notable for the following:    Pro B Natriuretic peptide (BNP) 2427.0 (*)    All other components within normal limits  PROTIME-INR  APTT  TROPONIN I  TYPE AND SCREEN   Imaging Review Dg Chest Port 1 View  10/13/2013   CLINICAL DATA:  Shortness of breath  EXAM: PORTABLE CHEST - 1 VIEW  COMPARISON:  10/07/2013  FINDINGS: Cardiac shadow remains enlarged. Increased vascular congestion and mild pulmonary edema is noted. No focal confluent infiltrate is seen. No bony abnormality is noted.  IMPRESSION: Changes consistent with CHF.   Electronically Signed   By: Alcide Clever M.D.   On: 10/13/2013 16:43    EKG Interpretation   None       MDM  Diagnosis: 1. Anemia 2. Congestive heart failure  Patient presents to the ER for evaluation of anemia. Patient had blood work performed while in rehabilitation today and was found to have hemoglobin of 6.8. She did have recent hemorrhage into her calf requiring surgery.  She did get blood transfusion during her hospitalization. Hemoglobin here is 7.1, her baseline is 8. This is approximately 1 g drop. In addition to this, however, patient is complaining of shortness of breath. BNP is elevated and chest x-ray is consistent with congestive heart failure. Will require hospitalization for treatment of anemia and acute decompensation of congestive heart failure.    Gilda Crease, MD 10/16/13 [phone]

## 2013-10-13 NOTE — ED Notes (Signed)
Per ems: pt from [facility], transported over d/t hemoglobin of 6.8. Pt wears 2L O2 at night. Hx HTN and afib. Skin graft to right leg (from right upper thigh to right lower leg) bp 131/71, pulse 52, saO2 96% on 2L [place]

## 2013-10-14 ENCOUNTER — Inpatient Hospital Stay (HOSPITAL_COMMUNITY): Payer: Medicare HMO

## 2013-10-14 DIAGNOSIS — I5042 Chronic combined systolic (congestive) and diastolic (congestive) heart failure: Secondary | ICD-10-CM

## 2013-10-14 DIAGNOSIS — IMO0002 Reserved for concepts with insufficient information to code with codable children: Secondary | ICD-10-CM

## 2013-10-14 DIAGNOSIS — R233 Spontaneous ecchymoses: Secondary | ICD-10-CM

## 2013-10-14 DIAGNOSIS — D638 Anemia in other chronic diseases classified elsewhere: Secondary | ICD-10-CM

## 2013-10-14 DIAGNOSIS — I509 Heart failure, unspecified: Secondary | ICD-10-CM

## 2013-10-14 LAB — COMPREHENSIVE METABOLIC PANEL
ALT: 7 U/L (ref 0–35)
AST: 9 U/L (ref 0–37)
AST: 10 U/L (ref 0–37)
AST: 9 U/L (ref 0–37)
Albumin: 2.3 g/dL — ABNORMAL LOW (ref 3.5–5.2)
Albumin: 2.6 g/dL — ABNORMAL LOW (ref 3.5–5.2)
BUN: 34 mg/dL — ABNORMAL HIGH (ref 6–23)
BUN: 34 mg/dL — ABNORMAL HIGH (ref 6–23)
CO2: 29 mEq/L (ref 19–32)
CO2: 27 mEq/L (ref 19–32)
CO2: 29 mEq/L (ref 19–32)
Calcium: 8.6 mg/dL (ref 8.4–10.5)
Calcium: 8.6 mg/dL (ref 8.4–10.5)
Chloride: 104 mEq/L (ref 96–112)
Chloride: 105 mEq/L (ref 96–112)
Creatinine, Ser: 2.33 mg/dL — ABNORMAL HIGH (ref 0.50–1.10)
Creatinine, Ser: 2.31 mg/dL — ABNORMAL HIGH (ref 0.50–1.10)
Creatinine, Ser: 2.42 mg/dL — ABNORMAL HIGH (ref 0.50–1.10)
GFR calc non Af Amer: 21 mL/min — ABNORMAL LOW (ref >90)
GFR calc non Af Amer: 21 mL/min — ABNORMAL LOW (ref >90)
GFR calc non Af Amer: 20 mL/min — ABNORMAL LOW (ref >90)
Sodium: 145 mEq/L (ref 135–145)
Sodium: 141 mEq/L (ref 135–145)
Total Bilirubin: 0.3 mg/dL (ref 0.3–1.2)
Total Bilirubin: 0.3 mg/dL (ref 0.3–1.2)
Total Protein: 6.5 g/dL (ref 6.0–8.3)
Total Protein: 7.3 g/dL (ref 6.0–8.3)
Total Protein: 6.7 g/dL (ref 6.0–8.3)

## 2013-10-14 LAB — CBC WITH DIFFERENTIAL/PLATELET
Basophils Absolute: 0 10*3/uL (ref 0.0–0.1)
Basophils Relative: 0 % (ref 0–1)
Eosinophils Absolute: 0.2 10*3/uL (ref 0.0–0.7)
Eosinophils Relative: 2 % (ref 0–5)
HCT: 21.6 % — ABNORMAL LOW (ref 36.0–46.0)
HCT: 24.6 % — ABNORMAL LOW (ref 36.0–46.0)
Hemoglobin: 6.6 g/dL — CL (ref 12.0–15.0)
Hemoglobin: 7.7 g/dL — ABNORMAL LOW (ref 12.0–15.0)
Lymphocytes Relative: 13 % (ref 12–46)
Lymphocytes Relative: 12 % (ref 12–46)
MCH: 26.7 pg (ref 26.0–34.0)
MCHC: 31.3 g/dL (ref 30.0–36.0)
MCV: 87.9 fL (ref 78.0–100.0)
Monocytes Absolute: 0.5 10*3/uL (ref 0.1–1.0)
Monocytes Absolute: 0.4 10*3/uL (ref 0.1–1.0)
Monocytes Relative: 8 % (ref 3–12)
Monocytes Relative: 5 % (ref 3–12)
Neutro Abs: 4.8 10*3/uL (ref 1.7–7.7)
Neutrophils Relative %: 76 % (ref 43–77)
Platelets: 253 10*3/uL (ref 150–400)
RDW: 17 % — ABNORMAL HIGH (ref 11.5–15.5)
WBC: 6.3 10*3/uL (ref 4.0–10.5)
WBC: 7.8 10*3/uL (ref 4.0–10.5)

## 2013-10-14 LAB — TROPONIN I
Troponin I: <0.3 ng/mL (ref <0.30)
Troponin I: <0.3 ng/mL (ref <0.30)

## 2013-10-14 LAB — GLUCOSE, CAPILLARY
Glucose-Capillary: 70 mg/dL (ref 70–99)
Glucose-Capillary: 104 mg/dL — ABNORMAL HIGH (ref 70–99)
Glucose-Capillary: 76 mg/dL (ref 70–99)

## 2013-10-14 LAB — MAGNESIUM: Magnesium: 2 mg/dL (ref 1.5–2.5)

## 2013-10-14 LAB — HEMOGLOBIN A1C: Mean Plasma Glucose: 108 mg/dL (ref <117)

## 2013-10-14 LAB — PREPARE RBC (CROSSMATCH)

## 2013-10-14 LAB — TSH: TSH: 4.617 u[IU]/mL — ABNORMAL HIGH (ref 0.350–4.500)

## 2013-10-14 MED ORDER — ISOSORBIDE MONONITRATE 10 MG PO TABS
10.0000 mg | ORAL_TABLET | Freq: Two times a day (BID) | ORAL | Status: DC
  Administered 2013-10-15: 10 mg via ORAL
  Filled 2013-10-14 (×2): qty 1

## 2013-10-14 MED ORDER — FUROSEMIDE 40 MG PO TABS
40.0000 mg | ORAL_TABLET | Freq: Two times a day (BID) | ORAL | Status: DC
  Administered 2013-10-15: 09:00:00 40 mg via ORAL
  Filled 2013-10-14 (×3): qty 1

## 2013-10-14 MED ORDER — FUROSEMIDE 10 MG/ML IJ SOLN
20.0000 mg | Freq: Once | INTRAMUSCULAR | Status: AC
  Administered 2013-10-14: 08:00:00 20 mg via INTRAVENOUS
  Filled 2013-10-14: qty 2

## 2013-10-14 MED ORDER — METOLAZONE 5 MG PO TABS
5.0000 mg | ORAL_TABLET | ORAL | Status: DC
  Administered 2013-10-15 – 2013-10-18 (×4): 5 mg via ORAL
  Filled 2013-10-14 (×6): qty 1

## 2013-10-14 MED ORDER — HYDRALAZINE HCL 20 MG/ML IJ SOLN: 10.0000 mg | INTRAMUSCULAR | Status: DC | PRN

## 2013-10-14 MED ORDER — LIDOCAINE HCL 2 % IJ SOLN
INTRAMUSCULAR | Status: AC
  Administered 2013-10-14: 14:00:00
  Filled 2013-10-14: qty 20

## 2013-10-14 MED ORDER — FUROSEMIDE 10 MG/ML IJ SOLN
40.0000 mg | Freq: Once | INTRAMUSCULAR | Status: AC
  Administered 2013-10-15: 40 mg via INTRAVENOUS
  Filled 2013-10-14: qty 4

## 2013-10-14 NOTE — Progress Notes (Signed)
CRITICAL VALUE ALERT  Critical value received:  Hgb 6.6  Date of notification:  10/14/13  Time of notification:  0208  Critical value read back:yes  Nurse who received alert:  Kathee Delton RN  MD notified (1st page):  Craige Cotta  Time of first page:  0224  MD notified (2nd page):  Time of second page:  Responding MD:  Craige Cotta  Time MD responded:  [phone]

## 2013-10-14 NOTE — Progress Notes (Signed)
Pt refused to wear BIPAP.  Pt states is feels like she is suffocating.

## 2013-10-14 NOTE — Progress Notes (Signed)
[facility] PROGRESS NOTE  [name]:[id number] DOB: [birth year] DOA: 10/13/2013 PCP: Nancy Saucer [name], MD  Assessment/Plan:  CHF systolic/diastolic; continue amlodipine, furosemide, hydralazine, metoprolol.  -Patient to receive extra dose of Lasix 40 mg in between the units of PRBC  -Troponin x3 (negative) -ProBNP= 2342 -Obtain EKG; 10/13/2013; normal sinus rhythm, nonspecific ST-T wave changes in leads II, III, aVF, cannot rule out LAH  -will add Zaroxolyn 5mg  for a.m. dose of Lasix  -Increase Lasix 40 mg  To BID - Current bed weight 127 kg; Admission weight= 127 (type of scale?)   Symptomatic anemia; -patient has received only approx 1 1/2 unit PRBC secondary to infiltration of IV site  - Administer 1 unit PRBC followed by 40 mg IV Lasix the patient's central line    A. fib with RVR; continue patient on home medications of amiodarone, currently in NSR   OSA; respiratory to provide CPAP machine; again emphasized to patient's the importance of using this machine   HTN; not within AHA guidelines; currently patient on medication consistent with CHF however still hypertensive.  -When necessary hydralazine for SBP> 150 or DBP>100 -Start isosorbide mononitrate in the morning    Diabetes type 2; continue home dose of Lantus. Will add SSI if required   CKD stage IV; patient close to baseline, creatinine ranges between 1.78-3.0   Psoriasis; will address after patient stable   Chronic anticoagulant; will hold all anticoagulants secondary to patient's recent right lower extremity hematoma and surgery requiring skin graft  Skin graft right lower extremity  -Noticed that in odor is beginning to emanate from bandaged area, consult wound care for Q. 24-48 hour dressing changes  Left arm hematoma -Patient has a stable left arm hematoma secondary to infiltration of IV line during Administration of PRBC  Disposition and planning Consulted PT/OT for in-house therapy and evaluation  for placement issues    Code Status: Full Family Communication: Husband at bedside for discussion of plan of care Disposition Plan:    Consultants:    Procedures:  10/14/2013 left subclavian triple-lumen central line placed  Antibiotics:    HPI/Subjective: [name] noncompliant BF PMHx psoriasis, HLD, OSA, HTN, A. fib with RVR, CHF combined systolic and diastolic, diabetes, chronic anticoagulations, CKD. stage IV, asthma. Patient sent to the ER from nursing home for evaluation of anemia. Patient reportedly had hemoglobin of 6.8. Patient has complex recent past medical history. She had developed a large calf hematoma secondary to anticoagulation which resulted in tissue necrosis requiring skin grafting. Patient has been off anticoagulation. Hemoglobin found to be low, sent to the ER. Patient does report that she has been feeling short of breath. She has not had chest pain or heart palpitations. No passing out. Patient denies any obvious blood loss other than small amount of bleeding on the dressing of her graft area. She does report moderate pain in the area of the graft. 10/13/2013 negative CP/nausea, negative blood in stool, positive SOB with conversation while on 2 L nasal cannula. States does not use her CPAP machine secondary to not having all the parts. States not on O2 normally during the day. TODAY states did not sleep well, only intermittently used CPAP machine provided. Continued SOB on 2 L via nasal cannula. Negative CP. Patient received approximately 1/2 unit PRBC secondary to infiltration of peripheral IV, and inability to obtain another peripheral IV site.    Objective: Filed Vitals:   10/14/13 1500 10/14/13 1600 10/14/13 2057 10/14/13 2330  BP: 158/44 166/49 157/50  164/50  Pulse: 55 58 54 58  Temp: 97.9 F (36.6 C) 97.9 F (36.6 C) 98.2 F (36.8 C) 98.1 F (36.7 C)  TempSrc: Oral Oral Oral Oral  Resp: 18 20 20 20   Height:      Weight:      SpO2:  99% 99%  97%    Intake/Output Summary (Last 24 hours) at 10/15/13 0001 Last data filed at 10/14/13 2132  Gross per 24 hour  Intake 1624.58 ml  Output   2075 ml  Net -450.42 ml   Filed Weights   10/13/13 1903 10/14/13 0500  Weight: 127 kg (279 lb 15.8 oz) 127 kg (279 lb 15.8 oz)    Exam: General: A./O. x4, NAD,  Eyes: Pupils equal reactive to light and accommodation  Neck: Negative JVD  Cardiovascular: Regular Larrey, negative murmurs rubs or gallops, DP/PT pulse one plus bilateral  Respiratory: Clear to auscultation bilateral  Abdomen: Morbidly obese, nontender, plus bowel side  Skin: Right lateral thigh skin graft harvest site, covered and clean, negative warmth to touch, negative erythema (did not take down the dressing); right lower leg split thickness skin graft site covered and clean, negative warmth to touch, negative erythema (did not take down the dressing), however in order starting to emanate from that area; Lt lower extremity severe cirrhosis, cirrhotic plaques on bilateral elbows  Musculoskeletal: Bilateral pedal edema on top of morbid obesity and psoriasis  Data Reviewed: Basic Metabolic Panel:  Recent Labs Lab 10/13/13 1503 10/14/13 0158 10/14/13 0806 10/14/13 1820  NA 142  145 145 141 142  K 4.1  4.2 4.2 4.1 4.3  CL 108  108 108 104 105  CO2 27  27 29 27 29   GLUCOSE 83  131* 89 75 91  BUN 35*  35* 35* 34* 34*  CREATININE 2.52*  2.32* 2.33* 2.31* 2.42*  CALCIUM 9.1  8.8 8.6 9.1 8.6  MG 2.0 2.0  --   --    Liver Function Tests:  Recent Labs Lab 10/13/13 1503 10/14/13 0158 10/14/13 0806 10/14/13 1820  AST 9 9 10 9   ALT 6 6 7 6   ALKPHOS 111 106 119* 110  BILITOT 0.2* 0.2* 0.3 0.3  PROT 6.7 6.5 7.3 6.7  ALBUMIN 2.3* 2.3* 2.6* 2.4*   No results found for this basename: LIPASE, AMYLASE,  in the last 168 hours No results found for this basename: AMMONIA,  in the last 168 hours CBC:  Recent Labs Lab 10/13/13 1503 10/14/13 0158 10/14/13 1850   WBC 7.1  7.7 6.3 7.8  NEUTROABS 5.7  6.1 4.8 6.2  HGB 7.1*  7.2* 6.6* 7.7*  HCT 23.2*  23.5* 21.6* 24.6*  MCV 88.9  87.7 87.4 87.9  PLT 272  255 225 253   Cardiac Enzymes:  Recent Labs Lab 10/13/13 1503 10/14/13 0158 10/14/13 0806  TROPONINI <0.30  <0.30 <0.30 <0.30   BNP (last 3 results)  Recent Labs  08/03/13 1122 10/13/13 1503 10/13/13 2041  PROBNP 1789.0* 2427.0* 2342.0*   CBG:  Recent Labs Lab 10/14/13 0805 10/14/13 1134 10/14/13 1627 10/14/13 2051  GLUCAP 70 73 104* 76    Recent Results (from the past 240 hour(s))  SURGICAL PCR SCREEN     Status: None   Collection Time    10/07/13  5:30 AM      Result Value Range Status   MRSA, PCR NEGATIVE  NEGATIVE Final   Staphylococcus aureus NEGATIVE  NEGATIVE Final   Comment:  The Xpert SA Assay (FDA     approved for NASAL specimens     in patients over 58 years of age),     is one component of     a comprehensive surveillance     program.  Test performance has     been validated by [employer] for patients greater     than or equal to 15 year old.     It is not intended     to diagnose infection nor to     guide or monitor treatment.     Studies: Dg Chest Port 1 View  10/14/2013   CLINICAL DATA:  Left-sided central line placement  EXAM: PORTABLE CHEST - 1 VIEW  COMPARISON:  10/13/2013  FINDINGS: Left-sided subclavian central line has its tip in the SVC 3 cm above the right atrium. No pneumothorax. Cardiomegaly persists with edema and or pneumonia bilaterally.  IMPRESSION: Left subclavian central line well positioned with its tip in the SVC 3 cm above the right atrium. No pneumothorax.  Cardiomegaly and pulmonary edema and or bilateral pneumonia.   Electronically Signed   By: Paulina Fusi M.D.   On: 10/14/2013 13:27   Dg Chest Port 1 View  10/13/2013   CLINICAL DATA:  Shortness of breath  EXAM: PORTABLE CHEST - 1 VIEW  COMPARISON:  10/07/2013  FINDINGS: Cardiac shadow remains enlarged.  Increased vascular congestion and mild pulmonary edema is noted. No focal confluent infiltrate is seen. No bony abnormality is noted.  IMPRESSION: Changes consistent with CHF.   Electronically Signed   By: Alcide Clever M.D.   On: 10/13/2013 16:43    Scheduled Meds: . allopurinol  100 mg Oral Q breakfast  . amiodarone  200 mg Oral Q breakfast  . amLODipine  10 mg Oral Q breakfast  . aspirin  325 mg Oral Q breakfast  . atorvastatin  20 mg Oral Q24H  . ferrous sulfate  325 mg Oral TID WC  . furosemide  40 mg Intravenous Once  . furosemide  40 mg Oral BID  . hydrALAZINE  50 mg Oral Custom  . insulin glargine  20 Units Subcutaneous Custom  . isosorbide mononitrate  10 mg Oral BID  . metolazone  5 mg Oral Q24H  . metoprolol tartrate  12.5 mg Oral Custom  . pantoprazole  40 mg Oral Daily   Continuous Infusions: . sodium chloride 20 mL/hr at 10/14/13 1718    Active Problems:   HYPERLIPIDEMIA   OBSTRUCTIVE SLEEP APNEA   HYPERTENSION   ATRIAL FIBRILLATION WITH RAPID VENTRICULAR RESPONSE   Acute combined systolic and diastolic heart failure   C V A / STROKE   DM (diabetes mellitus)   Chronic anticoagulation   CKD (chronic kidney disease), stage IV   Psoriasis   Hematoma complicating a procedure    Time spent: 40 minutes   Raymon Schlarb, J  [facility] Pager [phone]. If 7PM-7AM, please contact night-coverage at [email or web address], password [facility] 10/15/2013, 12:01 AM  LOS: 2 days

## 2013-10-14 NOTE — Progress Notes (Signed)
Pt has refused CPAP for the night, RT to monitor and assess as needed.  

## 2013-10-14 NOTE — Progress Notes (Signed)
Clinical Social Work Department BRIEF PSYCHOSOCIAL ASSESSMENT 10/14/2013  Patient:  [name], [name]     Account Number:  [id number]     Admit date:  10/13/2013  Clinical Social Worker:  Orpah Greek  Date/Time:  10/14/2013 10:43 AM  Referred by:  Physician  Date Referred:  10/14/2013 Referred for  Other - See comment   Other Referral:   Admitted from: [facility]   Interview type:  Patient Other interview type:    PSYCHOSOCIAL DATA Living Status:  FACILITY Admitted from facility:  [facility] Level of care:  Skilled Nursing Facility Primary support name:  Tabrina Bents (husband) ph#: [phone] Primary support relationship to patient:  SPOUSE Degree of support available:   good    CURRENT CONCERNS Current Concerns  Post-Acute Placement   Other Concerns:    SOCIAL WORK ASSESSMENT / PLAN CSW received consult that patient was admitted from [facility].   Assessment/plan status:  Information/Referral to [facility] Other assessment/ plan:   Information/referral to community resources:   CSW completed FL2 and faxed information to [facility], confirmed with [facility] that patient is ok to return when ready.    PATIENT'S/FAMILY'S RESPONSE TO PLAN OF CARE: Patient is agreeable to return to [facility] when ready, as she has only been there since October 15th for short-term rehab. Patient would like to get stronger to be able to return home with her husband.        Unice Bailey, LCSW [facility] Clinical Social Worker cell #: [phone]

## 2013-10-14 NOTE — Progress Notes (Signed)
Pt blood transfusion stopped at 0416 due to IV infiltrated.  IV removed.  Warm compress applied and right arm elevated.  MD notified.  Will continue to monitor.

## 2013-10-15 DIAGNOSIS — IMO0002 Reserved for concepts with insufficient information to code with codable children: Secondary | ICD-10-CM

## 2013-10-15 LAB — COMPREHENSIVE METABOLIC PANEL
ALT: 6 U/L (ref 0–35)
Albumin: 2.6 g/dL — ABNORMAL LOW (ref 3.5–5.2)
CO2: 30 mEq/L (ref 19–32)
Calcium: 9.2 mg/dL (ref 8.4–10.5)
Chloride: 103 mEq/L (ref 96–112)
Creatinine, Ser: 2.36 mg/dL — ABNORMAL HIGH (ref 0.50–1.10)
GFR calc Af Amer: 24 mL/min — ABNORMAL LOW (ref >90)
GFR calc non Af Amer: 21 mL/min — ABNORMAL LOW (ref >90)
Glucose, Bld: 64 mg/dL — ABNORMAL LOW (ref 70–99)
Sodium: 140 mEq/L (ref 135–145)
Total Bilirubin: 0.4 mg/dL (ref 0.3–1.2)

## 2013-10-15 LAB — CBC WITH DIFFERENTIAL/PLATELET
Basophils Absolute: 0 10*3/uL (ref 0.0–0.1)
Basophils Relative: 0 % (ref 0–1)
Eosinophils Absolute: 0.2 10*3/uL (ref 0.0–0.7)
Eosinophils Relative: 3 % (ref 0–5)
Lymphocytes Relative: 10 % — ABNORMAL LOW (ref 12–46)
Lymphs Abs: 0.8 10*3/uL (ref 0.7–4.0)
MCH: 26.9 pg (ref 26.0–34.0)
MCHC: 31.1 g/dL (ref 30.0–36.0)
MCV: 86.7 fL (ref 78.0–100.0)
Neutrophils Relative %: 81 % — ABNORMAL HIGH (ref 43–77)
Platelets: 265 10*3/uL (ref 150–400)
RBC: 3.23 MIL/uL — ABNORMAL LOW (ref 3.87–5.11)
RDW: 17.3 % — ABNORMAL HIGH (ref 11.5–15.5)

## 2013-10-15 LAB — TYPE AND SCREEN
ABO/RH(D): A POS
Antibody Screen: POSITIVE
Donor AG Type: NEGATIVE
Donor AG Type: NEGATIVE
Donor AG Type: NEGATIVE
Unit division: 0

## 2013-10-15 LAB — GLUCOSE, CAPILLARY
Glucose-Capillary: 75 mg/dL (ref 70–99)
Glucose-Capillary: 103 mg/dL — ABNORMAL HIGH (ref 70–99)
Glucose-Capillary: 114 mg/dL — ABNORMAL HIGH (ref 70–99)

## 2013-10-15 MED ORDER — FUROSEMIDE 10 MG/ML IJ SOLN
40.0000 mg | Freq: Two times a day (BID) | INTRAMUSCULAR | Status: DC
  Administered 2013-10-15 (×2): 40 mg via INTRAVENOUS
  Filled 2013-10-15 (×4): qty 4

## 2013-10-15 MED ORDER — POLYETHYLENE GLYCOL 3350 17 G PO PACK
17.0000 g | PACK | Freq: Every day | ORAL | Status: DC | PRN
  Filled 2013-10-15: qty 1

## 2013-10-15 MED ORDER — ISOSORBIDE MONONITRATE 20 MG PO TABS
20.0000 mg | ORAL_TABLET | Freq: Two times a day (BID) | ORAL | Status: DC
  Administered 2013-10-15 – 2013-10-21 (×12): 20 mg via ORAL
  Filled 2013-10-15 (×13): qty 1

## 2013-10-15 MED ORDER — SODIUM CHLORIDE 0.9 % IJ SOLN
10.0000 mL | INTRAMUSCULAR | Status: DC | PRN
  Administered 2013-10-15 (×2): 10 mL
  Administered 2013-10-16 – 2013-10-18 (×4): 20 mL
  Administered 2013-10-18 (×3): 10 mL
  Administered 2013-10-19: 20 mL
  Administered 2013-10-21: 10 mL

## 2013-10-15 NOTE — Procedures (Signed)
After patient's left peripheral IV infiltrated, and multiple attempts were made to reestablish a peripheral  IV patient consented to placement of a central venous line. The risks and benefits were explained to patient and husband and informed consent was obtained. Patient was placed in the Trendelenburg Position. A timeout was conducted to ensure the site of placement of the central venous catheter (left subclavian). The area was repaired in a sterile fashion, 1% lidocaine was used to anesthetize the area and a left subclavian triple-lumen central venous catheter was successfully placed. A stat PCXR was obtained to ensure proper placement of catheter. PCXR verified catheter was correctly placed, and no pneumothorax was present. Nursing staff was then notified that catheter could be used. There was minimal blood loss and patient tolerated procedure well     Time spent 60 minutes

## 2013-10-15 NOTE — Evaluation (Signed)
Physical Therapy Evaluation Patient Details Name: [name] MRN: [id number] DOB: [birth year] Today's Date: 10/15/2013 Time: 1914-7829 PT Time Calculation (min): 19 min  PT Assessment / Plan / Recommendation History of Present Illness  Pt is a 62 yo female admitted with anemia.  Given 2 Unitis of blood on 11/6.  Pt from SNF after having RLE hematoma requiring skin graft due to necrosis on 10/3.  Pt also with HTN, DM, AFIB, obesity, CHF, CKD and lymphedema.  Clinical Impression  Pt admitted for above. Pt currently presenting with functional limitations due to deficits listed below (see PT Problem List). Pt would benefit from skilled PT to increase independence and safety during functional mobility and to allow d/c to venue listed below.  Pt able to ambulate 30' to/from bathroom with RW and min guard, pt also able to stand for approx. 1 minute with bil UE support and min guard in order for PT to perform bathroom hygiene.  PT discussed going back to [facility] to improve endurance, strength, and safety during mobility and pt seems agreeable.    PT Assessment  Patient needs continued PT services    Follow Up Recommendations  SNF    Does the patient have the potential to tolerate intense rehabilitation      Barriers to Discharge        Equipment Recommendations  None recommended by PT    Recommendations for Other Services     Frequency Min 3X/week    Precautions / Restrictions Precautions Precautions: Fall Precaution Comments: decreased activity tolerance. O2 PRN Restrictions Weight Bearing Restrictions: No   Pertinent Vitals/Pain Pt reports bil LE pain is "ok" at rest with no increase in pain during ambulation. Pt reports receiving pain meds prior to PT session and pt positioned to comfort at end of session. No c/o SOB during session, pt on 3L of O2 [place] during entire session.      Mobility  Bed Mobility Bed Mobility: Not assessed Details for Bed Mobility Assistance: pt sitting  up in chair upon arrival. Transfers Transfers: Sit to Stand;Stand to Sit Sit to Stand: 4: Min assist;With upper extremity assist;With armrests Stand to Sit: 4: Min assist;With upper extremity assist;With armrests;To chair/3-in-1 Details for Transfer Assistance: assist to rise and steady. VC's for hand placement. pt reports bil LE pain is "ok" at rest and she wanted to ambulate to bathroom. Ambulation/Gait Ambulation/Gait Assistance: 4: Min guard Ambulation Distance (Feet): 30 Feet Assistive device: Rolling walker Ambulation/Gait Assistance Details: pt able to ambulate from recliner to bathroom and back to chair with RW and min guard to ensure safety. VC's for gait sequence and upright posture. Gait Pattern: Step-to pattern;Decreased stride length;Decreased stance time - left;Antalgic;Trunk flexed Gait velocity: decreased    Exercises     PT Diagnosis: Difficulty walking;Generalized weakness  PT Problem List: Decreased strength;Decreased activity tolerance;Decreased knowledge of use of DME;Obesity;Pain PT Treatment Interventions: DME instruction;Gait training;Functional mobility training;Therapeutic activities;Therapeutic exercise;Patient/family education     PT Goals(Current goals can be found in the care plan section) Acute Rehab PT Goals Patient Stated Goal: to go back to rehab in order to get stronger so she can go home PT Goal Formulation: With patient Time For Goal Achievement: 10/29/13 Potential to Achieve Goals: Good  Visit Information  Last PT Received On: 10/15/13 Assistance Needed: +1 History of Present Illness: Pt is a 62 yo female admitted with anemia.  Given 2 Unitis of blood on 11/6.  Pt from SNF after having RLE hematoma requiring skin graft due to  necrosis on 10/3.  Pt also with HTN, DM, AFIB, obesity, CHF, CKD and lymphedema.       Prior Functioning  Home Living Family/patient expects to be discharged to:: Skilled nursing facility Living Arrangements: Other  (Comment) (pt admitted [facility], will need to d/c back to SNF) Additional Comments: pt on 2LO2 at home during the night and during the day as needed Prior Function Level of Independence: Needs assistance Comments: pt reports she needs to get stronger and more independent before she goes home with husband and 2 sons. Communication Communication: No difficulties Dominant Hand: Left    Cognition  Cognition Arousal/Alertness: Awake/alert Behavior During Therapy: WFL for tasks assessed/performed Overall Cognitive Status: Within Functional Limits for tasks assessed    Extremity/Trunk Assessment Lower Extremity Assessment Lower Extremity Assessment: Generalized weakness;RLE deficits/detail;LLE deficits/detail RLE Deficits / Details: weakness suspected as pt requires RW to ambulate. RLE: Unable to fully assess due to pain LLE Deficits / Details: weakness suspected as pt requires RW to ambulate. LLE: Unable to fully assess due to pain   Balance Balance Balance Assessed: Yes Static Standing Balance Static Standing - Balance Support: Bilateral upper extremity supported;During functional activity Static Standing - Level of Assistance: 5: Stand by assistance Static Standing - Comment/# of Minutes: pt able to stand with bil UE support and min guard while standing for approx. 1 min. in order for PT to perform bathroom hygiene for pt.  End of Session PT - End of Session Equipment Utilized During Treatment: Oxygen (3L of O2 [place], no reports of SOB during session.) Activity Tolerance: Patient limited by fatigue;Patient limited by pain Patient left: in chair;with call bell/phone within reach;with family/visitor present  GP     Sol Blazing 10/15/2013, 12:59 PM

## 2013-10-15 NOTE — Progress Notes (Signed)
Inpatient Diabetes Program Recommendations  AACE/ADA: New Consensus Statement on Inpatient Glycemic Control (2013)  Target Ranges:  Prepandial:   less than 140 mg/dL      Peak postprandial:   less than 180 mg/dL (1-2 hours)      Critically ill patients:  140 - 180 mg/dL   Reason for Visit: Hypoglycemia  Results for RYLINN, LINZY (MRN [id number]) as of 10/15/2013 15:24  Ref. Range 10/14/2013 08:05 10/14/2013 11:34 10/14/2013 16:27 10/14/2013 20:51 10/15/2013 08:50 10/15/2013 11:32  Glucose-Capillary Latest Range: 70-99 mg/dL 70 73 409 (H) 76 75 811 (H)  Results for ANNET, MANUKYAN (MRN [id number]) as of 10/15/2013 15:24  Ref. Range 10/14/2013 08:06 10/14/2013 18:20 10/15/2013 05:00  Glucose Latest Range: 70-99 mg/dL 75 91 64 (L)    Inpatient Diabetes Program Recommendations Insulin - Basal: Reduce Lantus to 20 units in am and 10 units QHS Correction (SSI): Add Novolog sensitive tidwc tidwc  Note: Will continue to follow. Thank you. Ailene Ards, RD, LDN, CDE Inpatient Diabetes Coordinator [phone]

## 2013-10-15 NOTE — Progress Notes (Signed)
Pt has refused CPAP for the night, RT to monitor and assess as needed.  

## 2013-10-15 NOTE — Progress Notes (Signed)
Spoke with Dr. Izola Price regarding plastic surgery consult. MD is aware and told RN that plastic surgery would be by either today or tomorrow. Dressing is clean, dry, and intact at the moment. Will continue to monitor patient. Setzer, Don Broach

## 2013-10-15 NOTE — Consult Note (Signed)
WOC wound consult note Reason for Consult:Donor site at thigh and Graft site at LE. Surgery performed by Plastics (Dr.Thimmappa) on 10/30.  I have directed staff RN to provide Plastics PA's contact number to Dr. Izola Price so that they might be made aware of the patient's admission and perhaps direct the wound care for these two areas. Wound type:Surgical I did not see patient today.  If RN does not make contact with Dr. Izola Price or if Dr. Izola Price would prefer WOC Nurse to make recommendations, I will see. WOC nursing team will not follow, but will remain available to this patient, the nursing and medical team.  Please re-consult if needed. Thanks, Ladona Mow, MSN, RN, GNP, [place], CWON-AP [phone])

## 2013-10-15 NOTE — Evaluation (Signed)
I have reviewed this note and agree with all findings. Kati Yurika Pereda, PT, DPT Pager: [phone]   

## 2013-10-15 NOTE — Progress Notes (Signed)
  Echocardiogram 2D Echocardiogram has been performed.  Parris Cudworth 10/15/2013, 2:15 PM

## 2013-10-15 NOTE — Evaluation (Signed)
Occupational Therapy Evaluation Patient Details Name: [name] MRN: [id number] DOB: 1951-05-28 Today's Date: 10/15/2013 Time: 0865-7846 OT Time Calculation (min): 22 min  OT Assessment / Plan / Recommendation History of present illness Pt is a 62 yo female admitted with anemia.  Given 2 Unitis of blood on 11/6.  Pt from SNF after having RLE hematoma requiring skin graft due to necrosis on 10/3.  Pt also with HTN, DM, AFIB, obesity, CHF, CKD and lymphedema.   Clinical Impression   Pt admitted with the above diagnosis and extensive PMH and has the deficits listed below.  Pt would benefit from cont OT to increase I with basic adls and adls transfers to attempt to be able to d/c home with husband after further rehab.    OT Assessment  Patient needs continued OT Services    Follow Up Recommendations  SNF    Barriers to Discharge Decreased caregiver support only has husband to assist at home.  Equipment Recommendations  None recommended by OT    Recommendations for Other Services    Frequency  Min 2X/week    Precautions / Restrictions Precautions Precautions: Fall Precaution Comments: decreased activity tolerance. O2 PRN Restrictions Weight Bearing Restrictions: No   Pertinent Vitals/Pain Pt on 3 L of O2.  Pt SOB with activity.    ADL  Eating/Feeding: Performed;Set up Where Assessed - Eating/Feeding: Chair Grooming: Performed;Set up;Wash/dry face;Teeth care Where Assessed - Grooming: Supported sitting Upper Body Bathing: Simulated;Set up Where Assessed - Upper Body Bathing: Supported sitting Lower Body Bathing: Simulated;Moderate assistance Where Assessed - Lower Body Bathing: Supported sit to stand Upper Body Dressing: Simulated;Set up Where Assessed - Upper Body Dressing: Supported sitting Lower Body Dressing: Performed;+1 Total assistance Where Assessed - Lower Body Dressing: Supported sit to Pharmacist, hospital: Performed;Moderate assistance Toilet Transfer  Method: Stand pivot Acupuncturist: Bedside commode Toileting - Clothing Manipulation and Hygiene: Performed;+1 Total assistance Where Assessed - Toileting Clothing Manipulation and Hygiene: Standing Transfers/Ambulation Related to ADLs: Pt transferred to [facility] and to chair with mod assist going to the R (strong side). Pt reports L leg weak ever since she had the knee replaced on that side.  ADL Comments: Pt struggles with LE adls.  Will need AE to be able to achieve I.    OT Diagnosis: Generalized weakness;Acute pain  OT Problem List: Decreased strength;Decreased activity tolerance;Impaired balance (sitting and/or standing);Decreased knowledge of use of DME or AE;Cardiopulmonary status limiting activity;Obesity;Pain OT Treatment Interventions: Self-care/ADL training;Therapeutic activities;DME and/or AE instruction   OT Goals(Current goals can be found in the care plan section) Acute Rehab OT Goals Patient Stated Goal: home. less pain OT Goal Formulation: With patient Time For Goal Achievement: 10/29/13 Potential to Achieve Goals: Good ADL Goals Pt Will Perform Lower Body Bathing: with min assist;sit to/from stand;with adaptive equipment Pt Will Perform Lower Body Dressing: with min assist;sit to/from stand;with adaptive equipment Pt Will Transfer to Toilet: with min assist;bedside commode;ambulating Pt Will Perform Toileting - Clothing Manipulation and hygiene: with min guard assist;sit to/from stand Pt Will Perform Tub/Shower Transfer: Shower transfer;with min assist;shower seat;ambulating;rolling walker  Visit Information  Last OT Received On: 10/15/13 Assistance Needed: +1 History of Present Illness: Pt is a 62 yo female admitted with anemia.  Given 2 Unitis of blood on 11/6.  Pt from SNF after having RLE hematoma requiring skin graft due to necrosis on 10/3.  Pt also with HTN, DM, AFIB, obesity, CHF, CKD and lymphedema.       Prior Functioning  Home  Living Family/patient expects to be discharged to:: Skilled nursing facility Living Arrangements: Other (Comment) (came from snf and needs to return to snf.) Additional Comments: pt on 2LO2 at home during the night and during the day as needed Prior Function Level of Independence: Needs assistance Gait / Transfers Assistance Needed: uses RW.  Walking very short distances at [facility] before admission. ADL's / Homemaking Assistance Needed: does not wear socks; only slip on shoes.  Needed assist with LE dressing/bathing/toileting at SNF.  Required assist with all adl tranfers. Comments: uses [employer] for grocery shopping Communication Communication: No difficulties Dominant Hand: Left         Vision/Perception Vision - History Baseline Vision: Wears glasses all the time Patient Visual Report: No change from baseline Vision - Assessment Eye Alignment: Within Functional Limits Vision Assessment: Vision not tested Perception Perception: Within Functional Limits Praxis Praxis: Intact   Cognition  Cognition Arousal/Alertness: Awake/alert Behavior During Therapy: WFL for tasks assessed/performed Overall Cognitive Status: Within Functional Limits for tasks assessed General Comments: Pt self limits but was agreeable to get up to chair this morning to eat breakfast.    Extremity/Trunk Assessment Upper Extremity Assessment Upper Extremity Assessment: Generalized weakness Lower Extremity Assessment Lower Extremity Assessment: Defer to PT evaluation Cervical / Trunk Assessment Cervical / Trunk Assessment: Normal     Mobility Bed Mobility Bed Mobility: Supine to Sit;Sitting - Scoot to Edge of Bed Supine to Sit: 4: Min assist;HOB elevated Sitting - Scoot to [place] of Bed: 5: Supervision Details for Bed Mobility Assistance: Pt used bedrails and needed min assist to move L leg to side of bed. Transfers Transfers: Sit to Stand;Stand to Sit Sit to Stand: 3: Mod assist;From  bed Stand to Sit: 4: Min assist;To chair/3-in-1 Details for Transfer Assistance: Chair positioned on pts R side since she stated she transfers better this way.  Pt needed rest before transferring and became very SOB after the transfer.     Exercise     Balance Balance Balance Assessed: Yes Static Sitting Balance Static Sitting - Balance Support: Bilateral upper extremity supported;Feet supported Static Sitting - Level of Assistance: 5: Stand by assistance Static Standing Balance Static Standing - Balance Support: Bilateral upper extremity supported;During functional activity Static Standing - Level of Assistance: 4: Min assist   End of Session OT - End of Session Equipment Utilized During Treatment: Oxygen Activity Tolerance: Patient limited by fatigue;Patient limited by pain Patient left: in chair;with call bell/phone within reach Nurse Communication: Mobility status;Patient requests pain meds  GO     Hope Budds 10/15/2013, 8:37 AM [phone]

## 2013-10-15 NOTE — Progress Notes (Signed)
Patient ID: [name], female   DOB: 06-Nov-1951, 62 y.o.   MRN: [id number]  Plastic Surgery Follow up  Patient was just seen by Dr. Glenna Fellows on 10/13/13 for follow up of her skin graft and was doing very well with excellent take of the graft.   Dr. Leta Baptist recommends adaptic to the graft site and Kerlix wrapping with change of dressing every other day. The harvest or donor site dressing should not be taken down and the dressing can be reinforced with ABD's .   The patient should have a follow up appointment next week in the office with Dr. Leta Baptist, but she will see in the hospital if she remains hospitalized until next week.  Lazaro Arms, PA-C Plastic Surgery [phone]

## 2013-10-15 NOTE — Progress Notes (Addendum)
[facility] PROGRESS NOTE  [name] ZOX:[id number] DOB: [birth year] DOA: 10/13/2013 PCP: Willey Blade, MD  Brief narrative: 62 year old female with past medical history of hypertension, dyslipidemia, obstructive sleep apnea, diabetes, atrial fibrillation (was on anticoagulation), systolic and diastolic heart failure who presented to [facility] ED 10/13/2013 from SNF for symptomatic anemia with reported hemoglobin of 6.8. Patient apparently developed a large calf hematoma secondary to anticoagulation which resulted in tissue necrosis requiring skin grafting. Patient has been off anticoagulation since. In ED, blood pressure was 145/39, heart rate 54-63 and oxygen saturation 96% on room air. Hemoglobin was 6.6 on the admission. Creatinine was 2.31. Chest x-ray revealed cardiomegaly and pulmonary edema and/or bilateral pneumonia.  Assessment and Plan:  Principal problem:   Symptomatic anemia - Hemoglobin on admission 6.6. Patient to receive total of 2 units of PRBC since admission - Hemoglobin 8.7 this morning - Please note that the patient is off of anticoagulation due to development of hematoma of lower extremity  Active Problems:   Acute on chronic systolic and diastolic CHF - Last 2-D echo in March 2014 with ejection fraction of 40%. BNP on this admission is 2400 - I would change Lasix from by mouth to IV regimen, 40 mg IV daily - Daily weight and strict intake and output - Place electrolytes as indicated - Obtain 2-D echo this admission - Continue aspirin and statin therapy - Continue low-dose beta blocker - Continue hydralazine and Imdur  Atrial fibrillation - Not on Coumadin secondary to development of hematoma - Continue amiodarone - Patient is also on low dose beta blockers  Diabetes - A1c on admission is within normal limits - CBG's in past 24 hours: 76, 76 and 102 - use SSI only   OBSTRUCTIVE SLEEP APNEA - Noncompliant with CPAP   HYPERTENSION - Systolic blood pressure  in the 170 range - Increase Imdur to 20 mg twice a day, continue hydralazine 50 mg every 8 hours - Continue Norvasc and low-dose beta blocker   CKD (chronic kidney disease), stage IV - Creatinine 2.31-2.42 range - Only minimal IV fluids secondary to CHF   Hematoma, lower extremity - Appreciate wound consult   Non - medical Consultants:  Wound care consult  Procedures/Studies: Dg Chest Port 1 View 10/14/2013  IMPRESSION: Left subclavian central line well positioned with its tip in the SVC 3 cm above the right atrium. No pneumothorax.  Cardiomegaly and pulmonary edema and or bilateral pneumonia.  Dg Chest Port 1 View 10/13/2013    IMPRESSION: Changes consistent with CHF.   Electronically Signed   By: Alcide Clever M.D.   On: 10/13/2013 16:43   Antibiotics:  None   Code Status: Full Family Communication: Pt at bedside Disposition Plan: Home when medically stable  HPI/Subjective: No events overnight.   Objective: Filed Vitals:   10/15/13 0205 10/15/13 0235 10/15/13 0602 10/15/13 0835  BP: 167/47 154/52 145/39 172/48  Pulse: 59 59 63   Temp: 98.5 F (36.9 C) 98.3 F (36.8 C) 98 F (36.7 C)   TempSrc: Oral Oral Oral   Resp: 20 22 20    Height:      Weight:   125 kg (275 lb 9.2 oz)   SpO2: 97% 96% 99%     Intake/Output Summary (Last 24 hours) at 10/15/13 1221 Last data filed at 10/15/13 0731  Gross per 24 hour  Intake 1752.5 ml  Output   1900 ml  Net -147.5 ml    Exam:   General:  Pt is alert, not  in acute distress  Cardiovascular: irregular rhythm, rate controlled, S1/S2 appreciated  Respiratory: Clear to auscultation bilaterally, no wheezing, no crackles, no rhonchi  Abdomen: Soft, non tender, non distended, bowel sounds present, no guarding  Extremities: Pulses DP and PT palpable bilaterally; Right lateral thigh skin graft; LE edema  Neuro: Grossly nonfocal  Data Reviewed: Basic Metabolic Panel:  Recent Labs Lab 10/13/13 1503 10/14/13 0158  10/14/13 0806 10/14/13 1820 10/15/13 0500  NA 142  145 145 141 142 140  K 4.1  4.2 4.2 4.1 4.3 4.3  CL 108  108 108 104 105 103  CO2 27  27 29 27 29 30   GLUCOSE 83  131* 89 75 91 64*  BUN 35*  35* 35* 34* 34* 35*  CREATININE 2.52*  2.32* 2.33* 2.31* 2.42* 2.36*  CALCIUM 9.1  8.8 8.6 9.1 8.6 9.2  MG 2.0 2.0  --   --  2.2   Liver Function Tests:  Recent Labs Lab 10/13/13 1503 10/14/13 0158 10/14/13 0806 10/14/13 1820 10/15/13 0500  AST 9 9 10 9 10   ALT 6 6 7 6 6   ALKPHOS 111 106 119* 110 120*  BILITOT 0.2* 0.2* 0.3 0.3 0.4  PROT 6.7 6.5 7.3 6.7 7.3  ALBUMIN 2.3* 2.3* 2.6* 2.4* 2.6*   No results found for this basename: LIPASE, AMYLASE,  in the last 168 hours No results found for this basename: AMMONIA,  in the last 168 hours CBC:  Recent Labs Lab 10/13/13 1503 10/14/13 0158 10/14/13 1850 10/15/13 0500  WBC 7.1  7.7 6.3 7.8 7.8  NEUTROABS 5.7  6.1 4.8 6.2 6.3  HGB 7.1*  7.2* 6.6* 7.7* 8.7*  HCT 23.2*  23.5* 21.6* 24.6* 28.0*  MCV 88.9  87.7 87.4 87.9 86.7  PLT 272  255 225 253 265   Cardiac Enzymes:  Recent Labs Lab 10/13/13 1503 10/14/13 0158 10/14/13 0806  TROPONINI <0.30  <0.30 <0.30 <0.30   BNP: No components found with this basename: POCBNP,  CBG:  Recent Labs Lab 10/14/13 1134 10/14/13 1627 10/14/13 2051 10/15/13 0850 10/15/13 1132  GLUCAP 73 104* 76 75 102*    SURGICAL PCR SCREEN     Status: None   Collection Time    10/07/13  5:30 AM      Result Value Range Status   MRSA, PCR NEGATIVE  NEGATIVE Final   Staphylococcus aureus NEGATIVE  NEGATIVE Final  CULTURE, BLOOD (ROUTINE X 2)     Status: None   Collection Time    10/13/13  8:15 PM      Result Value Range Status   Specimen Description BLOOD LEFT HAND   Final   Value:        BLOOD CULTURE RECEIVED NO GROWTH TO DATE CULTURE WILL BE HELD FOR 5 DAYS BEFORE ISSUING A FINAL NEGATIVE REPORT     Performed at [facility]   Report Status PENDING   Incomplete   CULTURE, BLOOD (ROUTINE X 2)     Status: None   Collection Time    10/13/13  8:35 PM      Result Value Range Status   Specimen Description BLOOD LEFT ARM   Final   Value:        BLOOD CULTURE RECEIVED NO GROWTH TO DATE CULTURE WILL BE HELD FOR 5 DAYS BEFORE ISSUING A FINAL NEGATIVE REPORT     Performed at [facility]   Report Status PENDING   Incomplete     Scheduled Meds: . allopurinol  100  mg Oral Q breakfast  . amiodarone  200 mg Oral Q breakfast  . amLODipine  10 mg Oral Q breakfast  . aspirin  325 mg Oral Q breakfast  . atorvastatin  20 mg Oral Q24H  . ferrous sulfate  325 mg Oral TID WC  . furosemide  40 mg Oral BID  . hydrALAZINE  50 mg Oral Custom  . insulin glargine  20 Units Subcutaneous Custom  . isosorbide mononitrate  10 mg Oral BID  . metolazone  5 mg Oral Q24H  . metoprolol tartrate  12.5 mg Oral Custom  . pantoprazole  40 mg Oral Daily   Continuous Infusions: . sodium chloride 20 mL/hr at 10/14/13 1718     Debbora Presto, MD  [facility] Pager [phone]  If 7PM-7AM, please contact night-coverage [email or web address] Password [email or web address] 10/15/2013, 12:21 PM   LOS: 2 days

## 2013-10-16 DIAGNOSIS — J45901 Unspecified asthma with (acute) exacerbation: Secondary | ICD-10-CM

## 2013-10-16 DIAGNOSIS — I5031 Acute diastolic (congestive) heart failure: Secondary | ICD-10-CM

## 2013-10-16 LAB — CBC WITH DIFFERENTIAL/PLATELET
Basophils Absolute: 0 10*3/uL (ref 0.0–0.1)
Basophils Relative: 0 % (ref 0–1)
Eosinophils Absolute: 0.2 10*3/uL (ref 0.0–0.7)
Hemoglobin: 7.9 g/dL — ABNORMAL LOW (ref 12.0–15.0)
Lymphocytes Relative: 11 % — ABNORMAL LOW (ref 12–46)
Lymphs Abs: 0.8 10*3/uL (ref 0.7–4.0)
MCH: 27 pg (ref 26.0–34.0)
MCV: 88.7 fL (ref 78.0–100.0)
Monocytes Relative: 6 % (ref 3–12)
Neutro Abs: 5.7 10*3/uL (ref 1.7–7.7)
Neutrophils Relative %: 80 % — ABNORMAL HIGH (ref 43–77)
Platelets: 250 10*3/uL (ref 150–400)
RBC: 2.93 MIL/uL — ABNORMAL LOW (ref 3.87–5.11)
RDW: 17.7 % — ABNORMAL HIGH (ref 11.5–15.5)
WBC: 7.2 10*3/uL (ref 4.0–10.5)

## 2013-10-16 LAB — COMPREHENSIVE METABOLIC PANEL
ALT: 6 U/L (ref 0–35)
AST: 8 U/L (ref 0–37)
Albumin: 2.4 g/dL — ABNORMAL LOW (ref 3.5–5.2)
BUN: 41 mg/dL — ABNORMAL HIGH (ref 6–23)
CO2: 32 mEq/L (ref 19–32)
Calcium: 8.7 mg/dL (ref 8.4–10.5)
Creatinine, Ser: 2.72 mg/dL — ABNORMAL HIGH (ref 0.50–1.10)
GFR calc Af Amer: 20 mL/min — ABNORMAL LOW (ref >90)
GFR calc non Af Amer: 17 mL/min — ABNORMAL LOW (ref >90)
Glucose, Bld: 78 mg/dL (ref 70–99)
Sodium: 143 mEq/L (ref 135–145)
Total Bilirubin: 0.2 mg/dL — ABNORMAL LOW (ref 0.3–1.2)
Total Protein: 6.8 g/dL (ref 6.0–8.3)

## 2013-10-16 LAB — GLUCOSE, CAPILLARY
Glucose-Capillary: 69 mg/dL — ABNORMAL LOW (ref 70–99)
Glucose-Capillary: 98 mg/dL (ref 70–99)
Glucose-Capillary: 235 mg/dL — ABNORMAL HIGH (ref 70–99)
Glucose-Capillary: 129 mg/dL — ABNORMAL HIGH (ref 70–99)
Glucose-Capillary: 139 mg/dL — ABNORMAL HIGH (ref 70–99)

## 2013-10-16 LAB — MAGNESIUM: Magnesium: 2.2 mg/dL (ref 1.5–2.5)

## 2013-10-16 MED ORDER — POLYETHYLENE GLYCOL 3350 17 G PO PACK
17.0000 g | PACK | Freq: Every day | ORAL | Status: DC
  Administered 2013-10-16 – 2013-10-19 (×3): 17 g via ORAL
  Filled 2013-10-16 (×6): qty 1

## 2013-10-16 MED ORDER — INSULIN ASPART 100 UNIT/ML ~~LOC~~ SOLN
0.0000 [IU] | Freq: Three times a day (TID) | SUBCUTANEOUS | Status: DC
  Administered 2013-10-16 – 2013-10-17 (×2): 1 [IU] via SUBCUTANEOUS
  Administered 2013-10-18: 2 [IU] via SUBCUTANEOUS
  Administered 2013-10-21: 12:00:00 via SUBCUTANEOUS

## 2013-10-16 MED ORDER — INSULIN GLARGINE 100 UNIT/ML ~~LOC~~ SOLN
10.0000 [IU] | SUBCUTANEOUS | Status: DC
  Administered 2013-10-16 – 2013-10-21 (×10): 10 [IU] via SUBCUTANEOUS
  Filled 2013-10-16 (×11): qty 0.1

## 2013-10-16 MED ORDER — SENNOSIDES-DOCUSATE SODIUM 8.6-50 MG PO TABS
1.0000 | ORAL_TABLET | Freq: Two times a day (BID) | ORAL | Status: DC
  Administered 2013-10-16 – 2013-10-21 (×10): 1 via ORAL
  Filled 2013-10-16 (×12): qty 1

## 2013-10-16 MED ORDER — FUROSEMIDE 10 MG/ML IJ SOLN
40.0000 mg | Freq: Every day | INTRAMUSCULAR | Status: DC
  Administered 2013-10-16 – 2013-10-18 (×3): 40 mg via INTRAVENOUS
  Filled 2013-10-16 (×4): qty 4

## 2013-10-16 NOTE — Progress Notes (Addendum)
Patient ID: [name], female   DOB: 1951-07-21, 62 y.o.   MRN: [id number]  [facility] PROGRESS NOTE  FINLEIGH CHEONG ION:[id number] DOB: [birth year] DOA: 10/13/2013 PCP: Willey Blade, MD  Brief narrative:  62 year old female with past medical history of hypertension, dyslipidemia, obstructive sleep apnea, diabetes, atrial fibrillation (was on anticoagulation), systolic and diastolic heart failure who presented to [facility] ED 10/13/2013 from SNF for symptomatic anemia with reported hemoglobin of 6.8. Patient apparently developed a large calf hematoma secondary to anticoagulation which resulted in tissue necrosis requiring skin grafting. Patient has been off anticoagulation since.   In ED, blood pressure was 145/39, heart rate 54-63 and oxygen saturation 96% on room air. Hemoglobin was 6.6 on the admission. Creatinine was 2.31. Chest x-ray revealed cardiomegaly and pulmonary edema and/or bilateral pneumonia.   Assessment and Plan:  Principal problem:  Symptomatic anemia  - Hemoglobin on admission 6.6. Patient to receive total of 2 units of PRBC since admission  - Hemoglobin slightly down over the past 24 hours, hold off on transfusion at this point and transfuse if Hg < 7.5 - Please note that the patient is off of anticoagulation due to development of hematoma of lower extremity   Active Problems:  Acute on chronic systolic and diastolic CHF  - Last 2-D echo in March 2014 with ejection fraction of 40%. BNP on this admission is 2400  - continue IV lasix but lower frequency to QD form BID - Daily weight and strict intake and output  - 2 D ECHO pending  - Continue aspirin and statin therapy  - Continue low-dose beta blocker  - Continue hydralazine and Imdur  Atrial fibrillation  - Not on Coumadin secondary to development of hematoma  - Continue amiodarone  - Patient is also on low dose beta blockers  Diabetes  - A1c on admission is within normal limits  - appreciate diabetic  coordinator input - continue Lantus 10 units at bedtime OBSTRUCTIVE SLEEP APNEA  - Noncompliant with CPAP and again refusing overnight  HYPERTENSION  - Systolic blood pressure initially in the 170 range  - Continue Imdur 20 mg twice a day, continue hydralazine 50 mg every 8 hours  - Continue Norvasc and low-dose beta blocker  - BP now improved and within target range  CKD (chronic kidney disease), stage IV  - Creatinine 2.31-2.42 range but now up from yesterday - will lower the frequency of Lasix form 40 mg IV BID to 40 mg IV QD and plan on transitioning to oral dose in AM - Only minimal IV fluids secondary to CHF  Hematoma, lower extremity  - Appreciate wound care recommendation - appreciate plastic surgery seeing the pt  Consultants:  Wound care consult Plastic surgery Procedures/Studies:  Dg Chest Port 1 View  10/14/2013 Left subclavian central line well positioned with its tip in the SVC 3 cm above the right atrium. No pneumothorax. Cardiomegaly and pulmonary edema and or bilateral pneumonia.  Dg Chest Port 1 View  10/13/2013 Changes consistent with CHF. Antibiotics:  None   Code Status: Full  Family Communication: Pt at bedside  Disposition Plan: Home when medically stable    HPI/Subjective: No events overnight.   Objective: Filed Vitals:   10/15/13 0835 10/15/13 1513 10/15/13 2054 10/16/13 0500  BP: 172/48 141/56 141/35 129/45  Pulse:  64 52 68  Temp:   98.1 F (36.7 C) 98.5 F (36.9 C)  TempSrc:   Oral Oral  Resp:   18 18  Height:  Weight:    124.2 kg (273 lb 13 oz)  SpO2:   99% 99%    Intake/Output Summary (Last 24 hours) at 10/16/13 0721 Last data filed at 10/16/13 0700  Gross per 24 hour  Intake   1360 ml  Output    951 ml  Net    409 ml    Exam:   General:  Pt is alert, follows commands appropriately, not in acute distress  Cardiovascular: Regular rate and rhythm, S1/S2, no murmurs, no rubs, no gallops  Respiratory: Clear to auscultation  bilaterally, no wheezing, mild bibasilar crackles   Abdomen: Soft, non tender, non distended, bowel sounds present, no guarding  Extremities: lower extremity hematoma, bleeding, chronic venous stasis changes  Data Reviewed: Basic Metabolic Panel:  Recent Labs Lab 10/13/13 1503 10/14/13 0158 10/14/13 0806 10/14/13 1820 10/15/13 0500 10/16/13 0530  NA 142  145 145 141 142 140 143  K 4.1  4.2 4.2 4.1 4.3 4.3 4.2  CL 108  108 108 104 105 103 103  CO2 27  27 29 27 29 30  32  GLUCOSE 83  131* 89 75 91 64* 78  BUN 35*  35* 35* 34* 34* 35* 41*  CREATININE 2.52*  2.32* 2.33* 2.31* 2.42* 2.36* 2.72*  CALCIUM 9.1  8.8 8.6 9.1 8.6 9.2 8.7  MG 2.0 2.0  --   --  2.2 2.2   Liver Function Tests:  Recent Labs Lab 10/14/13 0158 10/14/13 0806 10/14/13 1820 10/15/13 0500 10/16/13 0530  AST 9 10 9 10 8   ALT 6 7 6 6 6   ALKPHOS 106 119* 110 120* 107  BILITOT 0.2* 0.3 0.3 0.4 0.2*  PROT 6.5 7.3 6.7 7.3 6.8  ALBUMIN 2.3* 2.6* 2.4* 2.6* 2.4*   CBC:  Recent Labs Lab 10/13/13 1503 10/14/13 0158 10/14/13 1850 10/15/13 0500 10/16/13 0530  WBC 7.1  7.7 6.3 7.8 7.8 7.2  NEUTROABS 5.7  6.1 4.8 6.2 6.3 5.7  HGB 7.1*  7.2* 6.6* 7.7* 8.7* 7.9*  HCT 23.2*  23.5* 21.6* 24.6* 28.0* 26.0*  MCV 88.9  87.7 87.4 87.9 86.7 88.7  PLT 272  255 225 253 265 250   Cardiac Enzymes:  Recent Labs Lab 10/13/13 1503 10/14/13 0158 10/14/13 0806  TROPONINI <0.30  <0.30 <0.30 <0.30   CBG:  Recent Labs Lab 10/14/13 2051 10/15/13 0850 10/15/13 1132 10/15/13 1626 10/15/13 2057  GLUCAP 76 75 102* 103* 114*    Recent Results (from the past 240 hour(s))  SURGICAL PCR SCREEN     Status: None   Collection Time    10/07/13  5:30 AM      Result Value Range Status   MRSA, PCR NEGATIVE  NEGATIVE Final   Staphylococcus aureus NEGATIVE  NEGATIVE Final   Comment:            The Xpert SA Assay (FDA     approved for NASAL specimens     in patients over 80 years of age),     is one  component of     a comprehensive surveillance     program.  Test performance has     been validated by [employer] for patients greater     than or equal to 21 year old.     It is not intended     to diagnose infection nor to     guide or monitor treatment.  CULTURE, BLOOD (ROUTINE X 2)     Status: None  Collection Time    10/13/13  8:15 PM      Result Value Range Status   Specimen Description BLOOD LEFT HAND   Final   Special Requests BOTTLES DRAWN AEROBIC AND ANAEROBIC 10CC   Final   Culture  Setup Time     Final   Value: 10/14/2013 01:07     Performed at [facility]   Culture     Final   Value:        BLOOD CULTURE RECEIVED NO GROWTH TO DATE CULTURE WILL BE HELD FOR 5 DAYS BEFORE ISSUING A FINAL NEGATIVE REPORT     Performed at [facility]   Report Status PENDING   Incomplete  CULTURE, BLOOD (ROUTINE X 2)     Status: None   Collection Time    10/13/13  8:35 PM      Result Value Range Status   Specimen Description BLOOD LEFT ARM   Final   Special Requests BOTTLES DRAWN AEROBIC AND ANAEROBIC 10CC   Final   Culture  Setup Time     Final   Value: 10/14/2013 01:07     Performed at [facility]   Culture     Final   Value:        BLOOD CULTURE RECEIVED NO GROWTH TO DATE CULTURE WILL BE HELD FOR 5 DAYS BEFORE ISSUING A FINAL NEGATIVE REPORT     Performed at [facility]   Report Status PENDING   Incomplete     Scheduled Meds: . allopurinol  100 mg Oral Q breakfast  . amiodarone  200 mg Oral Q breakfast  . amLODipine  10 mg Oral Q breakfast  . aspirin  325 mg Oral Q breakfast  . atorvastatin  20 mg Oral Q24H  . ferrous sulfate  325 mg Oral TID WC  . furosemide  40 mg Intravenous BID  . hydrALAZINE  50 mg Oral Custom  . insulin glargine  20 Units Subcutaneous Custom  . isosorbide mononitrate  20 mg Oral BID  . metolazone  5 mg Oral Q24H  . metoprolol tartrate  12.5 mg Oral Custom  . pantoprazole  40 mg Oral Daily   Continuous  Infusions: . sodium chloride 20 mL/hr at 10/14/13 1718     Debbora Presto, MD  [facility] Pager [phone]  If 7PM-7AM, please contact night-coverage [email or web address] Password [email or web address] 10/16/2013, 7:21 AM   LOS: 3 days

## 2013-10-17 LAB — CBC WITH DIFFERENTIAL/PLATELET
Basophils Absolute: 0 10*3/uL (ref 0.0–0.1)
Basophils Relative: 0 % (ref 0–1)
Eosinophils Absolute: 0.3 10*3/uL (ref 0.0–0.7)
Eosinophils Relative: 3 % (ref 0–5)
Lymphocytes Relative: 11 % — ABNORMAL LOW (ref 12–46)
MCH: 26.6 pg (ref 26.0–34.0)
MCV: 89.7 fL (ref 78.0–100.0)
Neutrophils Relative %: 80 % — ABNORMAL HIGH (ref 43–77)
Platelets: 257 10*3/uL (ref 150–400)
RBC: 3.01 MIL/uL — ABNORMAL LOW (ref 3.87–5.11)
RDW: 17.4 % — ABNORMAL HIGH (ref 11.5–15.5)
WBC: 7.9 10*3/uL (ref 4.0–10.5)

## 2013-10-17 LAB — COMPREHENSIVE METABOLIC PANEL
AST: 10 U/L (ref 0–37)
Albumin: 2.4 g/dL — ABNORMAL LOW (ref 3.5–5.2)
Alkaline Phosphatase: 105 U/L (ref 39–117)
BUN: 44 mg/dL — ABNORMAL HIGH (ref 6–23)
CO2: 33 mEq/L — ABNORMAL HIGH (ref 19–32)
Calcium: 8.3 mg/dL — ABNORMAL LOW (ref 8.4–10.5)
Chloride: 102 mEq/L (ref 96–112)
Creatinine, Ser: 2.49 mg/dL — ABNORMAL HIGH (ref 0.50–1.10)
GFR calc Af Amer: 23 mL/min — ABNORMAL LOW (ref >90)
GFR calc non Af Amer: 19 mL/min — ABNORMAL LOW (ref >90)
Glucose, Bld: 89 mg/dL (ref 70–99)
Potassium: 4.2 mEq/L (ref 3.5–5.1)
Total Bilirubin: 0.2 mg/dL — ABNORMAL LOW (ref 0.3–1.2)

## 2013-10-17 LAB — GLUCOSE, CAPILLARY
Glucose-Capillary: 100 mg/dL — ABNORMAL HIGH (ref 70–99)
Glucose-Capillary: 99 mg/dL (ref 70–99)
Glucose-Capillary: 131 mg/dL — ABNORMAL HIGH (ref 70–99)
Glucose-Capillary: 94 mg/dL (ref 70–99)

## 2013-10-17 NOTE — Progress Notes (Signed)
Spoke with patient in regards to CPAP HS, patient currently continues to refuse. RT will assist as needed.

## 2013-10-17 NOTE — Progress Notes (Signed)
[facility] PROGRESS NOTE  [name] UEA:[id number] DOB: [birth year] DOA: 10/13/2013 PCP: Willey Blade, MD  Brief narrative:  62 year old female with past medical history of hypertension, dyslipidemia, obstructive sleep apnea, diabetes, atrial fibrillation (was on anticoagulation), systolic and diastolic heart failure who presented to [facility] ED 10/13/2013 from SNF for symptomatic anemia with reported hemoglobin of 6.8. Patient apparently developed a large calf hematoma secondary to anticoagulation which resulted in tissue necrosis requiring skin grafting. Patient has been off anticoagulation since.  In ED, blood pressure was 145/39, heart rate 54-63 and oxygen saturation 96% on room air. Hemoglobin was 6.6 on the admission. Creatinine was 2.31. Chest x-ray revealed cardiomegaly and pulmonary edema and/or bilateral pneumonia.   Assessment and Plan:   Principal problem:  Symptomatic anemia  - Hemoglobin on admission 6.6. Patient received total of 2 units of PRBC since admission  - hemoglobin trend is 6.6 --> 8.7 --> 7.9 --> 8.0 - transfuse if hemoglobin less than 7.5 - Please note that the patient is off of anticoagulation due to development of hematoma of lower extremity  Active Problems:  Acute on chronic systolic and diastolic CHF  - Last 2-D echo in March 2014 with ejection fraction of 40%. BNP on this admission is 2400  - continue IV lasix daily - slight positive fluid balance over past 24 hours ~ 500 cc - weight trend since admission: 125 kg --> 124.2 kg --> 124 kg - 2 D ECHO on this admission is still pending as of today 11/9 - Continue aspirin and statin therapy  - Continue low-dose beta blocker  - Continue hydralazine and Imdur  Atrial fibrillation  - Not on Coumadin secondary to development of hematoma  - Continue amiodarone  - Patient is also on low dose beta blockers  Diabetes  - A1c on admission is within normal limits  - appreciate diabetic coordinator input  - Now  on Lanuts 10 units at bedtime and sliding scale insulin - CBG's in past 24 hours: 129, 139 and 100 OBSTRUCTIVE SLEEP APNEA  - Noncompliant with CPAP  HYPERTENSION  - Systolic blood pressure initially in the 170 range; this am 148/52 - continue Norvasc, hydralazine, Imdur and low dose beta blocker  CKD (chronic kidney disease), stage IV  - Creatinine 2.31-2.72 range; slight downward trend since yeterday Hematoma, left lower extremity  - Appreciate wound care recomendations - Dr. Leta Baptist of plastic surgery recommended adaptic to the graft site and Kerlix wrapping with change of dressing every other day. Also, the harvest/ donor site dressing should not be taken down and the dressing can be reinforced with ABD's . Will see the pt in hospital if pt remains inpatient otherwise follow up outpt per scheduled appt   Code Status: Full  Family Communication: Pt at bedside  Disposition Plan: Home when medically stable    Consultants:  Wound care consult Plastic surgery Procedures/Studies:  Dg Chest Port 1 View 10/14/2013 Left subclavian central line well positioned with its tip in the SVC 3 cm above the right atrium. No pneumothorax. Cardiomegaly and pulmonary edema and or bilateral pneumonia.  Dg Chest Port 1 View 10/13/2013 Changes consistent with CHF. Antibiotics:  None     HPI/Subjective: No events overnight.   Objective: Filed Vitals:   10/16/13 1349 10/16/13 1553 10/16/13 2100 10/17/13 0500  BP: 136/46 149/48 139/45 148/52  Pulse: 56 58 61 59  Temp: 98.9 F (37.2 C)  98.1 F (36.7 C) 97.8 F (36.6 C)  TempSrc: Oral  Oral Oral  Resp: 16  20 20   Height:      Weight:    124 kg (273 lb 5.9 oz)  SpO2: 97% 98% 96% 97%    Intake/Output Summary (Last 24 hours) at 10/17/13 0454 Last data filed at 10/17/13 0500  Gross per 24 hour  Intake   1860 ml  Output   1850 ml  Net     10 ml    Exam:   General:  Pt is  not in acute distress  Cardiovascular: Regular rate and rhythm,  S1/S2 appreciated  Respiratory: Clear to auscultation bilaterally, no wheezing  Abdomen: Soft, non tender, non distended, bowel sounds present, no guarding  Extremities: Pulses DP and PT palpable bilaterally; LLE skin graft  Neuro: Grossly nonfocal  Data Reviewed: Basic Metabolic Panel:  Recent Labs Lab 10/13/13 1503 10/14/13 0158 10/14/13 0806 10/14/13 1820 10/15/13 0500 10/16/13 0530 10/17/13 0458  NA 142  145 145 141 142 140 143 142  K 4.1  4.2 4.2 4.1 4.3 4.3 4.2 4.2  CL 108  108 108 104 105 103 103 102  CO2 27  27 29 27 29 30  32 33*  GLUCOSE 83  131* 89 75 91 64* 78 89  BUN 35*  35* 35* 34* 34* 35* 41* 44*  CREATININE 2.52*  2.32* 2.33* 2.31* 2.42* 2.36* 2.72* 2.49*  CALCIUM 9.1  8.8 8.6 9.1 8.6 9.2 8.7 8.3*  MG 2.0 2.0  --   --  2.2 2.2 2.2   Liver Function Tests:  Recent Labs Lab 10/14/13 0806 10/14/13 1820 10/15/13 0500 10/16/13 0530 10/17/13 0458  AST 10 9 10 8 10   ALT 7 6 6 6 5   ALKPHOS 119* 110 120* 107 105  BILITOT 0.3 0.3 0.4 0.2* 0.2*  PROT 7.3 6.7 7.3 6.8 6.9  ALBUMIN 2.6* 2.4* 2.6* 2.4* 2.4*   CBC:  Recent Labs Lab 10/14/13 0158 10/14/13 1850 10/15/13 0500 10/16/13 0530 10/17/13 0458  WBC 6.3 7.8 7.8 7.2 7.9  NEUTROABS 4.8 6.2 6.3 5.7 6.3  HGB 6.6* 7.7* 8.7* 7.9* 8.0*  HCT 21.6* 24.6* 28.0* 26.0* 27.0*  MCV 87.4 87.9 86.7 88.7 89.7  PLT 225 253 265 250 257   Cardiac Enzymes:  Recent Labs Lab 10/13/13 1503 10/14/13 0158 10/14/13 0806  TROPONINI <0.30  <0.30 <0.30 <0.30   CBG:  Recent Labs Lab 10/16/13 0746 10/16/13 0822 10/16/13 1111 10/16/13 1603 10/16/13 2034  GLUCAP 69* 98 235* 129* 139*    CULTURE, BLOOD (ROUTINE X 2)     Status: None   Collection Time    10/13/13  8:15 PM      Result Value Range Status   Specimen Description BLOOD LEFT HAND   Final   Value:        BLOOD CULTURE RECEIVED NO GROWTH TO DATE      Performed at [facility]   Report Status PENDING   Incomplete  CULTURE, BLOOD  (ROUTINE X 2)     Status: None   Collection Time    10/13/13  8:35 PM      Result Value Range Status   Specimen Description BLOOD LEFT ARM   Final   Value:        BLOOD CULTURE RECEIVED NO GROWTH TO DATE      Performed at [facility]   Report Status PENDING   Incomplete     Scheduled Meds: . allopurinol  100 mg Oral Q breakfast  . amiodarone  200 mg Oral Q breakfast  .  amLODipine  10 mg Oral Q breakfast  . aspirin  325 mg Oral Q breakfast  . atorvastatin  20 mg Oral Q24H  . ferrous sulfate  325 mg Oral TID WC  . furosemide  40 mg Intravenous Q breakfast  . hydrALAZINE  50 mg Oral Custom  . insulin aspart  0-9 Units Subcutaneous TID WC  . insulin glargine  10 Units Subcutaneous Custom  . isosorbide mononitrate  20 mg Oral BID  . metolazone  5 mg Oral Q24H  . metoprolol tartrate  12.5 mg Oral Custom  . pantoprazole  40 mg Oral Daily  . polyethylene glycol  17 g Oral Daily  . senna-docusate  1 tablet Oral BID    Debbora Presto, MD  [facility] Pager [phone]  If 7PM-7AM, please contact night-coverage [email or web address] Password [email or web address] 10/17/2013, 6:20 AM   LOS: 4 days

## 2013-10-18 DIAGNOSIS — T8459XA Infection and inflammatory reaction due to other internal joint prosthesis, initial encounter: Secondary | ICD-10-CM

## 2013-10-18 DIAGNOSIS — T8450XA Infection and inflammatory reaction due to unspecified internal joint prosthesis, initial encounter: Secondary | ICD-10-CM

## 2013-10-18 DIAGNOSIS — Z96659 Presence of unspecified artificial knee joint: Secondary | ICD-10-CM

## 2013-10-18 LAB — CBC WITH DIFFERENTIAL/PLATELET
Basophils Absolute: 0 10*3/uL (ref 0.0–0.1)
Eosinophils Absolute: 0.2 10*3/uL (ref 0.0–0.7)
Eosinophils Relative: 3 % (ref 0–5)
Hemoglobin: 8 g/dL — ABNORMAL LOW (ref 12.0–15.0)
Lymphocytes Relative: 12 % (ref 12–46)
Lymphs Abs: 0.8 10*3/uL (ref 0.7–4.0)
MCH: 26.7 pg (ref 26.0–34.0)
MCV: 90 fL (ref 78.0–100.0)
Monocytes Absolute: 0.4 10*3/uL (ref 0.1–1.0)
Monocytes Relative: 6 % (ref 3–12)
Platelets: 234 10*3/uL (ref 150–400)
RBC: 3 MIL/uL — ABNORMAL LOW (ref 3.87–5.11)
RDW: 17.1 % — ABNORMAL HIGH (ref 11.5–15.5)
WBC: 6.7 10*3/uL (ref 4.0–10.5)

## 2013-10-18 LAB — COMPREHENSIVE METABOLIC PANEL
AST: 9 U/L (ref 0–37)
Albumin: 2.4 g/dL — ABNORMAL LOW (ref 3.5–5.2)
BUN: 42 mg/dL — ABNORMAL HIGH (ref 6–23)
Calcium: 8.6 mg/dL (ref 8.4–10.5)
Chloride: 104 mEq/L (ref 96–112)
Creatinine, Ser: 2.46 mg/dL — ABNORMAL HIGH (ref 0.50–1.10)
Total Bilirubin: 0.2 mg/dL — ABNORMAL LOW (ref 0.3–1.2)
Total Protein: 6.8 g/dL (ref 6.0–8.3)

## 2013-10-18 LAB — GLUCOSE, CAPILLARY
Glucose-Capillary: 98 mg/dL (ref 70–99)
Glucose-Capillary: 104 mg/dL — ABNORMAL HIGH (ref 70–99)

## 2013-10-18 LAB — MAGNESIUM: Magnesium: 2.2 mg/dL (ref 1.5–2.5)

## 2013-10-18 MED ORDER — LEVOFLOXACIN 250 MG PO TABS
250.0000 mg | ORAL_TABLET | Freq: Every day | ORAL | Status: DC
  Administered 2013-10-18 – 2013-10-21 (×4): 250 mg via ORAL
  Filled 2013-10-18 (×4): qty 1

## 2013-10-18 MED ORDER — HYDRALAZINE HCL 25 MG PO TABS
75.0000 mg | ORAL_TABLET | Freq: Three times a day (TID) | ORAL | Status: DC
  Administered 2013-10-18 – 2013-10-21 (×8): 75 mg via ORAL
  Filled 2013-10-18 (×14): qty 1

## 2013-10-18 MED ORDER — FUROSEMIDE 40 MG PO TABS
40.0000 mg | ORAL_TABLET | Freq: Every day | ORAL | Status: DC
  Administered 2013-10-19 – 2013-10-21 (×3): 40 mg via ORAL
  Filled 2013-10-18 (×3): qty 1

## 2013-10-18 NOTE — Progress Notes (Signed)
OT Cancellation Note  Patient Details Name: [name] MRN: [id number] DOB: [birth year]   Cancelled Treatment:    Reason Eval/Treat Not Completed: Other (comment) Pt declined working with OT right now. States she just finished lunch and doesn't want to get up right now. Will reattempt as schedule allows.  Lennox Laity [phone] 10/18/2013, 12:38 PM

## 2013-10-18 NOTE — Progress Notes (Signed)
Spoke with Dr. Joseph Art regarding wound care he states he will speak with plastics tomorrow, and makes changes as needed.

## 2013-10-18 NOTE — Progress Notes (Addendum)
Occupational Therapy Treatment Patient Details Name: [name] MRN: [id number] DOB: 11-Aug-1951 Today's Date: 10/18/2013 Time: 4098-1191 OT Time Calculation (min): 28 min  OT Assessment / Plan / Recommendation  History of present illness Pt is a 62 yo female admitted with anemia.  Given 2 Unitis of blood on 11/6.  Pt from SNF after having RLE hematoma requiring skin graft due to necrosis on 10/3.  Pt also with HTN, DM, AFIB, obesity, CHF, CKD and lymphedema.   OT comments  Pt with limited activity tolerance. She transferred to [facility] and back to chair. Discussed LB ADL and AE use PRN. She requires verbal cues for PLB during tasks.   Follow Up Recommendations  SNF;Supervision/Assistance - 24 hour    Barriers to Discharge       Equipment Recommendations  None recommended by OT    Recommendations for Other Services    Frequency Min 2X/week   Progress towards OT Goals Progress towards OT goals: Progressing toward goals  Plan Discharge plan remains appropriate    Precautions / Restrictions Precautions Precautions: Fall Precaution Comments: decreased activity tolerance. O2 PRN Restrictions Weight Bearing Restrictions: No   Pertinent Vitals/Pain No complaint of    ADL  Toilet Transfer: Performed;Minimal assistance Toilet Transfer Method: Stand pivot Toilet Transfer Equipment: Bedside commode Equipment Used: Rolling walker ADL Comments: Discussed AE; pt has a Sports administrator and states she is familiar with use for donning LB clothing. She usually wears slip on shoes and no socks. Pt with small amount of liquid coming down back of leg when she stood from chair. Nursing and nursing tech made aware.  Stood again and no more leakage. Pt fatigues quickly and was 2/4 dyspnea with standing and transferring to [facility]. Remained on 3L O2 during entire session. Discussed PLB and pacing activity.  Able to recover with rest in chair.   OT Diagnosis:    OT Problem List:   OT Treatment Interventions:      OT Goals(current goals can now be found in the care plan section)    Visit Information  Last OT Received On: 10/18/13 Assistance Needed: +1 Reason Eval/Treat Not Completed: Other (comment) History of Present Illness: Pt is a 62 yo female admitted with anemia.  Given 2 Unitis of blood on 11/6.  Pt from SNF after having RLE hematoma requiring skin graft due to necrosis on 10/3.  Pt also with HTN, DM, AFIB, obesity, CHF, CKD and lymphedema.    Subjective Data      Prior Functioning       Cognition  Cognition Arousal/Alertness: Awake/alert Behavior During Therapy: WFL for tasks assessed/performed Overall Cognitive Status: Within Functional Limits for tasks assessed    Mobility  Transfers Transfers: Sit to Stand;Stand to Sit Sit to Stand: 4: Min assist;With upper extremity assist;From chair/3-in-1 Stand to Sit: 4: Min assist;With upper extremity assist;To chair/3-in-1 Details for Transfer Assistance: verbal cues for hand placement.    Exercises      Balance     End of Session OT - End of Session Equipment Utilized During Treatment: Rolling walker Activity Tolerance: Patient limited by fatigue Patient left: in chair;with call bell/phone within reach  GO     [name] [phone] 10/18/2013, 2:42 PM

## 2013-10-18 NOTE — Progress Notes (Signed)
[facility] PROGRESS NOTE  [name] ZOX:[id number] DOB: 1951-10-13 DOA: 10/13/2013 PCP: [name] [name], MD  Assessment/Plan:  CHF systolic/diastolic; continue amlodipine, furosemide, hydralazine, metoprolol.  -Transition over to PO Lasix  -DC Zaroxolyn -Troponin x3 (negative) -ProBNP= 2342 -Obtain EKG; 10/13/2013; normal sinus rhythm, nonspecific ST-T wave changes in leads II, III, aVF, cannot rule out LAH  - Current bed weight 127 kg; Admission weight= 122kg (type of scale?)  -Counseled patient that cardiac rehabilitation would be of benefit; patient will consider  Symptomatic anemia; -Resolved after 2-1/2 unit PRBC   A. fib with RVR; continue patient on home medications of amiodarone, currently in NSR   OSA; respiratory to provide CPAP machine; again emphasized to patient's the importance of using this machine; NOTE patient continues to refuse CPAP machine   HTN; not within AHA guidelines; currently patient on medication consistent with CHF however still slightly hypertensive. Patient's DBP extremely low we'll therefore hold medications at current level -Continue isosorbide mononitrate     Diabetes type 2; continue home dose of Lantus.    CKD stage IV; patient close to baseline, creatinine ranges between 1.78-3.0. Current creatinine= 2.46   Psoriasis; will address after patient stable   Chronic anticoagulant; will hold all anticoagulants secondary to patient's recent right lower extremity hematoma and surgery requiring skin graft  Skin graft right lower extremity  -odor continues from harvest site wound care for Q. 48 hour dressing changes per plastic surgery -Dr. Leta Baptist (plastic surgery) recommends adaptic to the graft site and Kerlix wrapping with change of dressing every other day. The harvest or donor site dressing should not be taken down and the dressing can be reinforced with ABD's . -Will request plastic surgery see patient again prior to discharge secondary  to harvest site Odor  Prosthetic joint infection -Continue levofloxacin per ID recommendations from 10/31  Left arm hematoma -Patient has a stable left arm hematoma secondary to infiltration of IV line during Administration of PRBC; resolving  Disposition and planning Continue  PT/OT for in-house therapy and evaluation for placement issues -Obtain ambulatory SpO2 on room air in the a.m.    Code Status: Full Family Communication:  Disposition Plan:    Consultants:  Surgery (PA Sean Rayburn)  Procedures:  10/14/2013 left subclavian triple-lumen central line placed  10/07/2013 below procedure performed by Dr. Glenna Fellows  Surgical preparation of wound bed for skin grafting 100 cm2, split thickness skin grafting to right leg ( 15 x 13 cm), application A Cell substrate to Right thigh donor site 100 cm2,   Echocardiogram 02/11/2013  - Left ventricle: The cavity size was normal. Wall thickness was increased in a pattern of moderate LVH. -Systolic function was mildly to moderately reduced.  -LVEF= 40% to 45%.  - (grade 2 diastolic dysfunction). - Aortic valve: Mild regurgitation. - Right ventricle: The cavity size was moderately dilated. - Right atrium: The atrium was moderately dilated. - Pulmonary arteries: PA peak pressure: 42mm Hg (S).    Antibiotics:  The levofloxacin 11/10>>per ID note (Dr. Leodis Liverpool) from 10/31 patient is to continue on medication for several months secondary to prosthetic joint infection with GBS    HPI/Subjective: [name] noncompliant BF PMHx psoriasis, HLD, OSA, HTN, A. fib with RVR, CHF combined systolic and diastolic, diabetes, chronic anticoagulations, CKD. stage IV, asthma. Patient sent to the ER from nursing home for evaluation of anemia. Patient reportedly had hemoglobin of 6.8. Patient has complex recent past medical history. She had developed a large calf hematoma secondary  to anticoagulation which resulted in tissue  necrosis requiring skin grafting. Patient has been off anticoagulation. Hemoglobin found to be low, sent to the ER. Patient does report that she has been feeling short of breath. She has not had chest pain or heart palpitations. No passing out. Patient denies any obvious blood loss other than small amount of bleeding on the dressing of her graft area. She does report moderate pain in the area of the graft. 10/13/2013 negative CP/nausea, negative blood in stool, positive SOB with conversation while on 2 L nasal cannula. States does not use her CPAP machine secondary to not having all the parts. States not on O2 normally during the day. TODAY states he would like to go home  Objective: Filed Vitals:   10/17/13 1325 10/17/13 1348 10/17/13 2100 10/18/13 0721  BP: 154/44 150/66 148/41 159/51  Pulse: 61  61 60  Temp: 97.8 F (36.6 C)  98 F (36.7 C) 97.8 F (36.6 C)  TempSrc: Oral  Oral Oral  Resp: 20  20 20   Height:      Weight:      SpO2: 100%  96% 97%    Intake/Output Summary (Last 24 hours) at 10/18/13 0828 Last data filed at 10/18/13 0750  Gross per 24 hour  Intake   1076 ml  Output   2400 ml  Net  -1324 ml   Filed Weights   10/15/13 0602 10/16/13 0500 10/17/13 0500  Weight: 125 kg (275 lb 9.2 oz) 124.2 kg (273 lb 13 oz) 124 kg (273 lb 5.9 oz)    Exam: General: A./O. x4, NAD,  Eyes: Pupils equal reactive to light and accommodation  Neck: Negative JVD  Cardiovascular: Regular rhythm and rate, negative murmurs rubs or gallops, DP/PT pulse one plus bilateral  Respiratory: Clear to auscultation bilateral  Abdomen: Morbidly obese, nontender, plus bowel side  Skin: Right lateral thigh skin graft harvest site, covered and clean, negative warmth to touch, negative erythema (did not take down the dressing); right lower leg split thickness skin graft site covered and clean, negative warmth to touch over emanating from site, negative erythema (did not take down the dressing),  Lt lower  extremity severe psoriasis psoriatic plaques on bilateral elbows  Musculoskeletal: Bilateral pedal edema on top of morbid obesity and psoriasis  Data Reviewed: Basic Metabolic Panel:  Recent Labs Lab 10/14/13 0158  10/14/13 1820 10/15/13 0500 10/16/13 0530 10/17/13 0458 10/18/13 0430  NA 145  < > 142 140 143 142 143  K 4.2  < > 4.3 4.3 4.2 4.2 4.1  CL 108  < > 105 103 103 102 104  CO2 29  < > 29 30 32 33* 34*  GLUCOSE 89  < > 91 64* 78 89 93  BUN 35*  < > 34* 35* 41* 44* 42*  CREATININE 2.33*  < > 2.42* 2.36* 2.72* 2.49* 2.46*  CALCIUM 8.6  < > 8.6 9.2 8.7 8.3* 8.6  MG 2.0  --   --  2.2 2.2 2.2 2.2  < > = values in this interval not displayed. Liver Function Tests:  Recent Labs Lab 10/14/13 1820 10/15/13 0500 10/16/13 0530 10/17/13 0458 10/18/13 0430  AST 9 10 8 10 9   ALT 6 6 6 5 6   ALKPHOS 110 120* 107 105 102  BILITOT 0.3 0.4 0.2* 0.2* 0.2*  PROT 6.7 7.3 6.8 6.9 6.8  ALBUMIN 2.4* 2.6* 2.4* 2.4* 2.4*   No results found for this basename: LIPASE, AMYLASE,  in the last  168 hours No results found for this basename: AMMONIA,  in the last 168 hours CBC:  Recent Labs Lab 10/14/13 1850 10/15/13 0500 10/16/13 0530 10/17/13 0458 10/18/13 0430  WBC 7.8 7.8 7.2 7.9 6.7  NEUTROABS 6.2 6.3 5.7 6.3 5.2  HGB 7.7* 8.7* 7.9* 8.0* 8.0*  HCT 24.6* 28.0* 26.0* 27.0* 27.0*  MCV 87.9 86.7 88.7 89.7 90.0  PLT 253 265 250 257 234   Cardiac Enzymes:  Recent Labs Lab 10/13/13 1503 10/14/13 0158 10/14/13 0806  TROPONINI <0.30  <0.30 <0.30 <0.30   BNP (last 3 results)  Recent Labs  08/03/13 1122 10/13/13 1503 10/13/13 2041  PROBNP 1789.0* 2427.0* 2342.0*   CBG:  Recent Labs Lab 10/17/13 0742 10/17/13 1149 10/17/13 1707 10/17/13 2116 10/18/13 0733  GLUCAP 100* 99 131* 94 95    Recent Results (from the past 240 hour(s))  CULTURE, BLOOD (ROUTINE X 2)     Status: None   Collection Time    10/13/13  8:15 PM      Result Value Range Status   Specimen  Description BLOOD LEFT HAND   Final   Special Requests BOTTLES DRAWN AEROBIC AND ANAEROBIC 10CC   Final   Culture  Setup Time     Final   Value: 10/14/2013 01:07     Performed at [facility]   Culture     Final   Value:        BLOOD CULTURE RECEIVED NO GROWTH TO DATE CULTURE WILL BE HELD FOR 5 DAYS BEFORE ISSUING A FINAL NEGATIVE REPORT     Performed at [facility]   Report Status PENDING   Incomplete  CULTURE, BLOOD (ROUTINE X 2)     Status: None   Collection Time    10/13/13  8:35 PM      Result Value Range Status   Specimen Description BLOOD LEFT ARM   Final   Special Requests BOTTLES DRAWN AEROBIC AND ANAEROBIC 10CC   Final   Culture  Setup Time     Final   Value: 10/14/2013 01:07     Performed at [facility]   Culture     Final   Value:        BLOOD CULTURE RECEIVED NO GROWTH TO DATE CULTURE WILL BE HELD FOR 5 DAYS BEFORE ISSUING A FINAL NEGATIVE REPORT     Performed at [facility]   Report Status PENDING   Incomplete     Studies: No results found.  Scheduled Meds: . allopurinol  100 mg Oral Q breakfast  . amiodarone  200 mg Oral Q breakfast  . amLODipine  10 mg Oral Q breakfast  . aspirin  325 mg Oral Q breakfast  . atorvastatin  20 mg Oral Q24H  . ferrous sulfate  325 mg Oral TID WC  . furosemide  40 mg Intravenous Q breakfast  . hydrALAZINE  50 mg Oral Custom  . insulin aspart  0-9 Units Subcutaneous TID WC  . insulin glargine  10 Units Subcutaneous Custom  . isosorbide mononitrate  20 mg Oral BID  . metolazone  5 mg Oral Q24H  . metoprolol tartrate  12.5 mg Oral Custom  . pantoprazole  40 mg Oral Daily  . polyethylene glycol  17 g Oral Daily  . senna-docusate  1 tablet Oral BID   Continuous Infusions: . sodium chloride 20 mL/hr at 10/16/13 1538    Active Problems:   HYPERLIPIDEMIA   OBSTRUCTIVE SLEEP APNEA   HYPERTENSION  ATRIAL FIBRILLATION WITH RAPID VENTRICULAR RESPONSE   Acute combined systolic and diastolic heart  failure   C V A / STROKE   DM (diabetes mellitus)   Chronic anticoagulation   CKD (chronic kidney disease), stage IV   Anemia of chronic disease   PAF (paroxysmal atrial fibrillation)   Psoriasis   Chronic combined systolic and diastolic CHF (congestive heart failure)   Hematoma complicating a procedure    Time spent: 40 minutes   WOODS, CURTIS, J  [facility] Pager [phone]. If 7PM-7AM, please contact night-coverage at [email or web address], password [facility] 10/18/2013, 8:28 AM  LOS: 5 days

## 2013-10-18 NOTE — Progress Notes (Signed)
Patient has been refusing CPAP machine. Patient has been made aware to call if she changes her mind and does want to try the machine. RT will continue to assist as needed.

## 2013-10-19 DIAGNOSIS — Z5189 Encounter for other specified aftercare: Secondary | ICD-10-CM

## 2013-10-19 DIAGNOSIS — Z7901 Long term (current) use of anticoagulants: Secondary | ICD-10-CM

## 2013-10-19 DIAGNOSIS — R0602 Shortness of breath: Secondary | ICD-10-CM | POA: Diagnosis present

## 2013-10-19 LAB — COMPREHENSIVE METABOLIC PANEL
ALT: 7 U/L (ref 0–35)
AST: 11 U/L (ref 0–37)
Albumin: 2.5 g/dL — ABNORMAL LOW (ref 3.5–5.2)
Calcium: 8.9 mg/dL (ref 8.4–10.5)
Chloride: 102 mEq/L (ref 96–112)
Creatinine, Ser: 2.13 mg/dL — ABNORMAL HIGH (ref 0.50–1.10)
Glucose, Bld: 87 mg/dL (ref 70–99)
Sodium: 141 mEq/L (ref 135–145)

## 2013-10-19 LAB — CBC WITH DIFFERENTIAL/PLATELET
Eosinophils Absolute: 0.2 10*3/uL (ref 0.0–0.7)
Eosinophils Relative: 3 % (ref 0–5)
Hemoglobin: 8.2 g/dL — ABNORMAL LOW (ref 12.0–15.0)
Lymphs Abs: 1.1 10*3/uL (ref 0.7–4.0)
MCH: 26.8 pg (ref 26.0–34.0)
MCHC: 30 g/dL (ref 30.0–36.0)
MCV: 89.2 fL (ref 78.0–100.0)
Monocytes Absolute: 0.4 10*3/uL (ref 0.1–1.0)
Monocytes Relative: 5 % (ref 3–12)
Neutro Abs: 5.4 10*3/uL (ref 1.7–7.7)
RBC: 3.06 MIL/uL — ABNORMAL LOW (ref 3.87–5.11)
WBC: 7.1 10*3/uL (ref 4.0–10.5)

## 2013-10-19 LAB — GLUCOSE, CAPILLARY
Glucose-Capillary: 115 mg/dL — ABNORMAL HIGH (ref 70–99)
Glucose-Capillary: 102 mg/dL — ABNORMAL HIGH (ref 70–99)
Glucose-Capillary: 110 mg/dL — ABNORMAL HIGH (ref 70–99)
Glucose-Capillary: 79 mg/dL (ref 70–99)

## 2013-10-19 LAB — MAGNESIUM: Magnesium: 2.2 mg/dL (ref 1.5–2.5)

## 2013-10-19 MED ORDER — SODIUM CHLORIDE 0.9 % IV BOLUS (SEPSIS)
500.0000 mL | Freq: Once | INTRAVENOUS | Status: AC
  Administered 2013-10-19: 15:00:00 500 mL via INTRAVENOUS

## 2013-10-19 MED ORDER — POLYETHYLENE GLYCOL 3350 17 G PO PACK: 17.0000 g | PACK | Freq: Every day | ORAL | Status: DC

## 2013-10-19 MED ORDER — ONDANSETRON HCL 4 MG PO TABS: 4.0000 mg | ORAL_TABLET | Freq: Four times a day (QID) | ORAL | Status: DC | PRN

## 2013-10-19 MED ORDER — LEVOFLOXACIN 250 MG PO TABS: 250.0000 mg | ORAL_TABLET | Freq: Every day | ORAL | Status: DC

## 2013-10-19 MED ORDER — HYDRALAZINE HCL 10 MG PO TABS: 75.0000 mg | ORAL_TABLET | Freq: Three times a day (TID) | ORAL | Status: DC

## 2013-10-19 MED ORDER — ISOSORBIDE MONONITRATE 20 MG PO TABS: 20.0000 mg | ORAL_TABLET | Freq: Two times a day (BID) | ORAL | Status: DC

## 2013-10-19 MED ORDER — SENNOSIDES-DOCUSATE SODIUM 8.6-50 MG PO TABS: 1.0000 | ORAL_TABLET | Freq: Two times a day (BID) | ORAL | Status: DC

## 2013-10-19 MED ORDER — METOPROLOL TARTRATE 12.5 MG HALF TABLET: 12.5000 mg | ORAL_TABLET | Freq: Two times a day (BID) | ORAL | Status: DC

## 2013-10-19 MED ORDER — INSULIN GLARGINE 100 UNIT/ML ~~LOC~~ SOLN: 10.0000 [IU] | Freq: Two times a day (BID) | SUBCUTANEOUS | Status: DC

## 2013-10-19 MED ORDER — FUROSEMIDE 40 MG PO TABS: 40.0000 mg | ORAL_TABLET | Freq: Every day | ORAL | Status: DC

## 2013-10-19 MED ORDER — ONDANSETRON HCL 4 MG/2ML IJ SOLN
4.0000 mg | Freq: Once | INTRAMUSCULAR | Status: AC
  Administered 2013-10-19: 4 mg via INTRAVENOUS

## 2013-10-19 NOTE — Progress Notes (Addendum)
Patient is set to discharge back to [facility] today. Patient & husband at bedside aware. Discharge packet in [facility] - RN, [facility] aware. [facility] Medicare authorization obtained. [facility] called for transport.   Unice Bailey, LCSW [facility] Clinical Social Worker cell #: [phone]'

## 2013-10-19 NOTE — Discharge Summary (Addendum)
Physician Discharge Summary  [name] FAO:[id number] DOB: 1951/10/23 DOA: 10/13/2013  PCP: Willey Blade, MD  Admit date: 10/13/2013 Discharge date: 10/21/2013  Time spent: 50 minutes  Recommendations for Outpatient Follow-up:  Wound Care - May leave Right thigh donor site open to air.  - Promogran to open area of right calf 3 times weekly and cover with dry dressing upon d/c - May apply moisturizing cream to thigh donor site and calf grafted area as needed.  - Ok to shower, soap and water ok.    CHF systolic/diastolic; continue amlodipine, furosemide, hydralazine, metoprolol.  -Transition over to PO Lasix  -DC Zaroxolyn  -Troponin x3 (negative)  -ProBNP= 2342  -Obtain EKG; 10/13/2013; normal sinus rhythm, nonspecific ST-T wave changes in leads II, III, aVF, cannot rule out LAH  - Current bed weight 127 kg; Admission weight= 122kg (type of scale?)  -Counseled patient that cardiac rehabilitation would be of benefit; patient will consider  -Patient will need to be weighed immediately upon arrival at [facility]. This will be her dry weight. Patient will need to be weighed daily in the same manner and on the same scale  Symptomatic anemia;  -Resolved after 2-1/2 unit PRBC   A. fib with RVR; continue patient on home medications of amiodarone, currently in NSR   OSA; respiratory to provide CPAP machine; again emphasized to patient's the importance of using this machine; NOTE patient continues to refuse CPAP machine   HTN; not within AHA guidelines; currently patient on medication consistent with CHF however still slightly hypertensive. Patient's DBP extremely low we'll therefore hold medications at current level  -Continue isosorbide mononitrate   Diabetes type 2; continue home dose of Lantus.   CKD stage IV; patient close to baseline, creatinine ranges between 1.78-3.0. Current creatinine= 2.46   Psoriasis; PCP to address    Chronic anticoagulant; will hold all  anticoagulants secondary to patient's recent right lower extremity hematoma and surgery requiring skin graft Skin graft right lower extremity  -Spoke with PA Shawn Rayburn (plastic surgery) gave [place] permission to change dressing prior to discharge. Dressing removed area lightly cleansed with normal saline, Xeroform placed over wound and  ABDs used to provide addition protection.  -Dr. Leta Baptist (plastic surgery) recommends adaptic to the graft site and Kerlix wrapping with change of dressing every other day.  - patient has followup appointment on 10/20/2013 with Dr. Leta Baptist at 11:30   Prosthetic joint infection  -Continue levofloxacin per ID recommendations from 10/31   Left arm hematoma  -Patient has a stable left arm hematoma secondary to infiltration of IV line during Administration of PRBC; resolving   SOB Patient resting sat on room air was 96%. After ambulating in hallway with walker, patient saturation was 89-92% on room air. Mild shortness of breath noted. Patient ambulated 30 feet. -Patient does not qualify for home O2  Constipation: - Pt reports long hx of constipation. Reports little BM recently. Little effect with oral cathartics. Ordered a trial of SMOG enema with large impacted stool noted. Pt still reports constipation. Will prescribe amitiza bid.   Discharge Diagnoses:  Active Problems:   HYPERLIPIDEMIA   OBSTRUCTIVE SLEEP APNEA   HYPERTENSION   ATRIAL FIBRILLATION WITH RAPID VENTRICULAR RESPONSE   Acute combined systolic and diastolic heart failure   C V A / STROKE   DM (diabetes mellitus)   Chronic anticoagulation   CKD (chronic kidney disease), stage IV   Anemia of chronic disease   PAF (paroxysmal atrial fibrillation)   Psoriasis  Chronic combined systolic and diastolic CHF (congestive heart failure)   Hematoma complicating a procedure   Infection of prosthetic knee joint   SOB (shortness of breath)   Discharge Condition: Stable  Diet recommendation:  ADA/AHA  Filed Weights   10/19/13 0657 10/20/13 0444 10/21/13 0447  Weight: 121.065 kg (266 lb 14.4 oz) 120.974 kg (266 lb 11.2 oz) 123 kg (271 lb 2.7 oz)    History of present illness:  [name] noncompliant BF PMHx psoriasis, HLD, OSA, HTN, A. fib with RVR, CHF combined systolic and diastolic, diabetes, chronic anticoagulations, CKD. stage IV, asthma. Patient sent to the ER from nursing home for evaluation of anemia. Patient reportedly had hemoglobin of 6.8. Patient has complex recent past medical history. She had developed a large calf hematoma secondary to anticoagulation which resulted in tissue necrosis requiring skin grafting. Patient has been off anticoagulation. Hemoglobin found to be low, sent to the ER. Patient does report that she has been feeling short of breath. She has not had chest pain or heart palpitations. No passing out. Patient denies any obvious blood loss other than small amount of bleeding on the dressing of her graft area. She does report moderate pain in the area of the graft. 10/13/2013 negative CP/nausea, negative blood in stool, positive SOB with conversation while on 2 L nasal cannula. States does not use her CPAP machine secondary to not having all the parts. States not on O2 normally during the day. 10/19/1999 states he would like to go home. TODAY ready for discharge   Consultants:  Surgery (PA Sean Rayburn)  Procedures:  10/14/2013 left subclavian triple-lumen central line placed 10/07/2013 below procedure performed by Dr. Glenna Fellows  Surgical preparation of wound bed for skin grafting 100 cm2, split thickness skin grafting to right leg ( 15 x 13 cm), application A Cell substrate to Right thigh donor site 100 cm2,   Echocardiogram 02/11/2013  - Left ventricle: The cavity size was normal. Wall thickness was increased in a pattern of moderate LVH. -Systolic function was mildly to moderately reduced.  -LVEF= 40% to 45%.  - (grade 2 diastolic  dysfunction). - Aortic valve: Mild regurgitation. - Right ventricle: The cavity size was moderately dilated. - Right atrium: The atrium was moderately dilated. - Pulmonary arteries: PA peak pressure: 42mm Hg (S).   Antibiotics:  The levofloxacin 11/10>>per ID note (Dr. Leodis Liverpool) from 10/31 patient is to continue on medication for several months secondary to prosthetic joint infection with GBS  Discharge Exam: Filed Vitals:   10/20/13 1450 10/20/13 2153 10/21/13 0447 10/21/13 0759  BP:  152/52 140/43 145/43  Pulse:  50 55 58  Temp:  97.9 F (36.6 C) 98.3 F (36.8 C)   TempSrc:  Oral Oral   Resp:  20 20   Height:      Weight:   123 kg (271 lb 2.7 oz)   SpO2: 95% 97% 97%    General: A./O. x4, NAD,  Eyes: Pupils equal reactive to light and accommodation  Neck: Negative JVD  Cardiovascular: Regular rhythm and rate, negative murmurs rubs or gallops, DP/PT pulse one plus bilateral  Respiratory: Clear to auscultation bilateral  Abdomen: Morbidly obese, nontender, plus bowel side  Skin: Right lateral thigh skin graft harvest site, change dressing, site showed negative purulence, entire area shows good granulation tissue small area of scab.negative warmth to touch, negative erythema;  right lower leg split thickness skin graft site covered and clean, negative warmth to touch, negative erythema (did not  take down the dressing), Lt lower extremity severe psoriasis psoriatic plaques on bilateral elbows  Musculoskeletal: Bilateral pedal edema on top of morbid obesity and psoriasis   Discharge Instructions       Future Appointments Provider Department Dept Phone   01/06/2014 1:45 PM Randall Hiss, MD [facility] Disease [phone]       Medication List    STOP taking these medications       aspirin 325 MG tablet      TAKE these medications       albuterol 108 (90 BASE) MCG/ACT inhaler  Commonly known as:  PROVENTIL HFA;VENTOLIN HFA   Inhale 2 puffs into the lungs every 6 (six) hours as needed for wheezing or shortness of breath.     allopurinol 100 MG tablet  Commonly known as:  ZYLOPRIM  Take 100 mg by mouth daily. Patient takes at 0800am daily     amiodarone 200 MG tablet  Commonly known as:  PACERONE  Take 200 mg by mouth daily. Patient takes at 0800am daily     amLODipine 10 MG tablet  Commonly known as:  NORVASC  Take 10 mg by mouth daily. Patient takes at 0800am daily     atorvastatin 20 MG tablet  Commonly known as:  LIPITOR  Take 20 mg by mouth at bedtime. Patient takes at 2100     ferrous sulfate 325 (65 FE) MG tablet  Take 325 mg by mouth 3 (three) times daily with meals. Patient takes at 0800, 1200, and 1700     furosemide 40 MG tablet  Commonly known as:  LASIX  Take 40 mg by mouth daily. Patient takes at 0800am daily     furosemide 40 MG tablet  Commonly known as:  LASIX  Take 1 tablet (40 mg total) by mouth daily.     hydrALAZINE 10 MG tablet  Commonly known as:  APRESOLINE  Take 7.5 tablets (75 mg total) by mouth 3 (three) times daily.     insulin glargine 100 UNIT/ML injection  Commonly known as:  LANTUS  Inject 0.1 mLs (10 Units total) into the skin 2 (two) times daily.     isosorbide mononitrate 20 MG tablet  Commonly known as:  ISMO,MONOKET  Take 1 tablet (20 mg total) by mouth 2 (two) times daily at 10 AM and 5 PM.     levofloxacin 250 MG tablet  Commonly known as:  LEVAQUIN  Take 1 tablet (250 mg total) by mouth daily.     lubiprostone 24 MCG capsule  Commonly known as:  AMITIZA  Take 1 capsule (24 mcg total) by mouth 2 (two) times daily with a meal.     metoprolol tartrate 12.5 mg Tabs tablet  Commonly known as:  LOPRESSOR  Take 0.5 tablets (12.5 mg total) by mouth 2 (two) times daily.     ondansetron 4 MG tablet  Commonly known as:  ZOFRAN  Take 4 mg by mouth every 6 (six) hours as needed for nausea.     ondansetron 4 MG tablet  Commonly known as:  ZOFRAN  Take 1  tablet (4 mg total) by mouth every 6 (six) hours as needed for nausea.     oxyCODONE-acetaminophen 5-325 MG per tablet  Commonly known as:  PERCOCET/ROXICET  Take 1-2 tablets by mouth every 4 (four) hours as needed for moderate pain or severe pain (1 tablet for pain rated 1 through 5.  2 tablets for pain rated 6 to 10.).  pantoprazole 40 MG tablet  Commonly known as:  PROTONIX  Take 40 mg by mouth daily.     polyethylene glycol packet  Commonly known as:  MIRALAX / GLYCOLAX  Take 17 g by mouth daily.     senna-docusate 8.6-50 MG per tablet  Commonly known as:  Senokot-S  Take 1 tablet by mouth 2 (two) times daily.     zolpidem 5 MG tablet  Commonly known as:  AMBIEN  Take 5 mg by mouth at bedtime as needed for sleep.       Allergies  Allergen Reactions  . Daptomycin Other (See Comments)    Elevated CPK  . Morphine And Related Nausea And Vomiting  . Cephalexin Rash    unknown  . Celecoxib     Unknown, can use furosemide without issue  . Codeine     unknown  . Fluoxetine Hcl     unknown  . Latex     REACTION: undefined  . Ofloxacin     unknown  . Penicillins     unknown  . Rofecoxib     unknown  . Sulfonamide Derivatives     unknown  . Tramadol Nausea And Vomiting   Follow-up Information   Follow up with [name], [name], MD. Schedule an appointment as soon as possible for a visit in 2 weeks.   Specialty:  Internal Medicine   Contact information:   [facility] [address] [place] [address] [phone]       Follow up with Glenna Fellows, MD. Call in 2 weeks.   Specialty:  Plastic Surgery   Contact information:   [address] [place] [address] [phone]        The results of significant diagnostics from this hospitalization (including imaging, microbiology, ancillary and laboratory) are listed below for reference.    Significant Diagnostic Studies: Dg Chest 2 View  10/07/2013   CLINICAL DATA:   Preoperative chest x-ray.  EXAM: CHEST  2 VIEW  COMPARISON:  09/15/2013.  FINDINGS: The heart is enlarged but stable. The right IJ catheter has been removed. Persistent vascular congestion centrally with prominent vasculature. No infiltrates, edema or effusions. Left basilar density is likely atelectasis. The bony thorax is intact.  IMPRESSION: Cardiac enlargement and central vascular congestion but no overt pulmonary edema or pleural effusion.  Left basilar density is likely atelectasis.   Electronically Signed   By: Loralie Champagne M.D.   On: 10/07/2013 07:16   Dg Chest Port 1 View  10/14/2013   CLINICAL DATA:  Left-sided central line placement  EXAM: PORTABLE CHEST - 1 VIEW  COMPARISON:  10/13/2013  FINDINGS: Left-sided subclavian central line has its tip in the SVC 3 cm above the right atrium. No pneumothorax. Cardiomegaly persists with edema and or pneumonia bilaterally.  IMPRESSION: Left subclavian central line well positioned with its tip in the SVC 3 cm above the right atrium. No pneumothorax.  Cardiomegaly and pulmonary edema and or bilateral pneumonia.   Electronically Signed   By: Paulina Fusi M.D.   On: 10/14/2013 13:27   Dg Chest Port 1 View  10/13/2013   CLINICAL DATA:  Shortness of breath  EXAM: PORTABLE CHEST - 1 VIEW  COMPARISON:  10/07/2013  FINDINGS: Cardiac shadow remains enlarged. Increased vascular congestion and mild pulmonary edema is noted. No focal confluent infiltrate is seen. No bony abnormality is noted.  IMPRESSION: Changes consistent with CHF.   Electronically Signed   By: Alcide Clever  M.D.   On: 10/13/2013 16:43    Microbiology: Recent Results (from the past 240 hour(s))  CULTURE, BLOOD (ROUTINE X 2)     Status: None   Collection Time    10/13/13  8:15 PM      Result Value Range Status   Specimen Description BLOOD LEFT HAND   Final   Special Requests BOTTLES DRAWN AEROBIC AND ANAEROBIC 10CC   Final   Culture  Setup Time     Final   Value: 10/14/2013 01:07      Performed at [facility]   Culture     Final   Value: NO GROWTH 5 DAYS     Performed at [facility]   Report Status 10/20/2013 FINAL   Final  CULTURE, BLOOD (ROUTINE X 2)     Status: None   Collection Time    10/13/13  8:35 PM      Result Value Range Status   Specimen Description BLOOD LEFT ARM   Final   Special Requests BOTTLES DRAWN AEROBIC AND ANAEROBIC 10CC   Final   Culture  Setup Time     Final   Value: 10/14/2013 01:07     Performed at [facility]   Culture     Final   Value: NO GROWTH 5 DAYS     Performed at [facility]   Report Status 10/20/2013 FINAL   Final  MRSA PCR SCREENING     Status: None   Collection Time    10/18/13  8:53 PM      Result Value Range Status   MRSA by PCR NEGATIVE  NEGATIVE Final   Comment:            The GeneXpert MRSA Assay (FDA     approved for NASAL specimens     only), is one component of a     comprehensive MRSA colonization     surveillance program. It is not     intended to diagnose MRSA     infection nor to guide or     monitor treatment for     MRSA infections.     Labs: Basic Metabolic Panel:  Recent Labs Lab 10/17/13 0458 10/18/13 0430 10/19/13 0535 10/20/13 0445 10/21/13 0426  NA 142 143 141 141 140  K 4.2 4.1 3.9 4.3 4.2  CL 102 104 102 101 101  CO2 33* 34* 33* 32 31  GLUCOSE 89 93 87 78 95  BUN 44* 42* 38* 35* 42*  CREATININE 2.49* 2.46* 2.13* 2.18* 2.54*  CALCIUM 8.3* 8.6 8.9 9.3 8.6  MG 2.2 2.2 2.2 2.3 2.3   Liver Function Tests:  Recent Labs Lab 10/17/13 0458 10/18/13 0430 10/19/13 0535 10/20/13 0445 10/21/13 0426  AST 10 9 11 12 9   ALT 5 6 7 6 6   ALKPHOS 105 102 102 112 104  BILITOT 0.2* 0.2* 0.3 0.3 0.2*  PROT 6.9 6.8 6.9 7.5 6.8  ALBUMIN 2.4* 2.4* 2.5* 2.8* 2.5*   No results found for this basename: LIPASE, AMYLASE,  in the last 168 hours No results found for this basename: AMMONIA,  in the last 168 hours CBC:  Recent Labs Lab 10/17/13 0458  10/18/13 0430 10/19/13 0535 10/20/13 0445 10/21/13 0426  WBC 7.9 6.7 7.1 8.4 8.4  NEUTROABS 6.3 5.2 5.4 6.7 6.6  HGB 8.0* 8.0* 8.2* 8.9* 8.3*  HCT 27.0* 27.0* 27.3* 30.3* 28.0*  MCV 89.7 90.0 89.2 89.9 90.0  PLT 257 234 221 289 262  Cardiac Enzymes: No results found for this basename: CKTOTAL, CKMB, CKMBINDEX, TROPONINI,  in the last 168 hours BNP: BNP (last 3 results)  Recent Labs  08/03/13 1122 10/13/13 1503 10/13/13 2041  PROBNP 1789.0* 2427.0* 2342.0*   CBG:  Recent Labs Lab 10/20/13 0749 10/20/13 1157 10/20/13 1658 10/20/13 2142 10/21/13 0741  GLUCAP 78 76 105* 97 88       Signed:  Carolyne Littles, MD [facility] [phone] pager

## 2013-10-19 NOTE — Progress Notes (Signed)
Pt with hx of nontraumatic calf hematoma while supratheraputic on Coumadin with full thickness skin loss. Now 2 weeks post STSG to calf. Concern for odor of thigh donor site this am.   PE  Appears SOB, c/o nausea Alert oriented R thigh donor site healed, no open areas, removed some residual A Cell R calf with maturing graft; single area appr 1 x 2 cm at proximal extent that is remaining open wound   A/P Wound look good-  May leave Right thigh donor site open to air.  Promogran to open area of right calf 3 times weekly and cover with dry dressing upon d/c; can do Adpatic while in hospital.  May apply moisturizing cream to thigh donor site and calf grafted area as needed.  Ok to shower, soap and water ok.  Will cancel her scheduled appt with me for 10/20/13 and can call for f/u in 2 weeks.  Glenna Fellows, MD [facility]astic [facility] Reconstructive Surgery [phone]

## 2013-10-19 NOTE — Progress Notes (Signed)
Patient continues to refuse the CPAP machine and is aware to call if she happens to change her mind about wearing it. RT will continue to assist as needed.

## 2013-10-19 NOTE — Progress Notes (Signed)
Patient resting sat on room air was 96%. After ambulating in hallway with walker, patient saturation was 89-92% on room air. Mild shortness of breath noted. Patient ambulated 30 feet. MD notified. Will continue to monitor patient. Setzer, Don Broach

## 2013-10-19 NOTE — Progress Notes (Signed)
Wound care completed on patient. Right upper thigh and right lower leg dressing changed. MD at beside. Patient tolerated well. Will continue to monitor. Setzer, Don Broach

## 2013-10-19 NOTE — Progress Notes (Signed)
PT Cancellation Note  ___Treatment cancelled today due to medical issues with patient which prohibited therapy  ___ Treatment cancelled today due to patient receiving procedure or test   ___ Treatment cancelled today due to patient's refusal to participate   _X_ Treatment cancelled today due to pt declined x 2 attempts   Felecia Shelling  PTA [facility]  Acute  Rehab Pager      [phone]

## 2013-10-20 LAB — GLUCOSE, CAPILLARY
Glucose-Capillary: 78 mg/dL (ref 70–99)
Glucose-Capillary: 97 mg/dL (ref 70–99)

## 2013-10-20 LAB — COMPREHENSIVE METABOLIC PANEL
ALT: 6 U/L (ref 0–35)
AST: 12 U/L (ref 0–37)
Albumin: 2.8 g/dL — ABNORMAL LOW (ref 3.5–5.2)
Alkaline Phosphatase: 112 U/L (ref 39–117)
BUN: 35 mg/dL — ABNORMAL HIGH (ref 6–23)
Calcium: 9.3 mg/dL (ref 8.4–10.5)
Potassium: 4.3 mEq/L (ref 3.5–5.1)
Sodium: 141 mEq/L (ref 135–145)
Total Protein: 7.5 g/dL (ref 6.0–8.3)

## 2013-10-20 LAB — CULTURE, BLOOD (ROUTINE X 2): Culture: NO GROWTH

## 2013-10-20 LAB — CBC WITH DIFFERENTIAL/PLATELET
Basophils Absolute: 0 10*3/uL (ref 0.0–0.1)
Eosinophils Relative: 3 % (ref 0–5)
HCT: 30.3 % — ABNORMAL LOW (ref 36.0–46.0)
Lymphocytes Relative: 12 % (ref 12–46)
Lymphs Abs: 1.1 10*3/uL (ref 0.7–4.0)
MCH: 26.4 pg (ref 26.0–34.0)
MCV: 89.9 fL (ref 78.0–100.0)
Monocytes Absolute: 0.4 10*3/uL (ref 0.1–1.0)
RBC: 3.37 MIL/uL — ABNORMAL LOW (ref 3.87–5.11)
RDW: 17 % — ABNORMAL HIGH (ref 11.5–15.5)
WBC: 8.4 10*3/uL (ref 4.0–10.5)

## 2013-10-20 LAB — MAGNESIUM: Magnesium: 2.3 mg/dL (ref 1.5–2.5)

## 2013-10-20 MED ORDER — SORBITOL 70 % SOLN
960.0000 mL | TOPICAL_OIL | Freq: Once | ORAL | Status: AC
  Administered 2013-10-20: 960 mL via RECTAL
  Filled 2013-10-20: qty 240

## 2013-10-20 NOTE — Progress Notes (Signed)
Changed dressing on right lower leg per order. Patient tolerated well. Will continue to monitor patient. Setzer, Don Broach

## 2013-10-20 NOTE — Progress Notes (Signed)
Patient was given SMOG enema. Patient was unable to pass stool at rectum. Disimpacted large amounts hard stool from rectum. Patient was encourage to sit on the bedside commode after enema. Patient feels better. MD notified. Will continue to monitor patient. Setzer, Don Broach

## 2013-10-20 NOTE — Progress Notes (Deleted)
Changed dressing on left lower leg per order. Patient tolerated well. Will continue to monitor patient. Setzer, Don Broach

## 2013-10-20 NOTE — Progress Notes (Signed)
Patient continues to refuse the CPAP machine and was made aware to call if she changes her mind about wearing it. RT will continue to monitor as needed.

## 2013-10-20 NOTE — Progress Notes (Signed)
[facility] PROGRESS NOTE  [name] HYQ:[id number] DOB: June 20, 1951 DOA: 10/13/2013 PCP: [name] [name], MD  Assessment/Plan: CHF systolic/diastolic; continue amlodipine, furosemide, hydralazine, metoprolol.  -Transition over to PO Lasix  -DC'd Zaroxolyn  -Troponin x3 (negative)  -ProBNP= 2342  -Obtain EKG; 10/13/2013; normal sinus rhythm, nonspecific ST-T wave changes in leads II, III, aVF, cannot rule out [facility]  Admission weight= 122kg (type of scale?)  -Counseled patient that cardiac rehabilitation would be of benefit; patient will consider  -Patient will need to be weighed immediately upon arrival at [facility]. This will be her dry weight. Patient will need to be weighed daily in the same manner and on the same scale  Symptomatic anemia;  -Resolved after 2-1/2 unit PRBC  A. fib with RVR; continue patient on home medications of amiodarone, currently in NSR  OSA; respiratory to provide CPAP machine; again emphasized to patient's the importance of using this machine; NOTE patient continues to refuse CPAP machine  HTN; not within AHA guidelines; currently patient on medication consistent with CHF however still slightly hypertensive. Patient's DBP extremely low we'll therefore hold medications at current level  -Continue isosorbide mononitrate  Diabetes type 2; continue home dose of Lantus.  CKD stage IV; patient close to baseline, creatinine ranges between 1.78-3.0. Psoriasis; PCP to address  Chronic anticoagulant; will hold all anticoagulants secondary to patient's recent right lower extremity hematoma and surgery requiring skin graft Skin graft right lower extremity  -Spoke with PA Shawn Rayburn (plastic surgery) gave [place] permission to change dressing prior to discharge. Dressing removed area lightly cleansed with normal saline, Xeroform placed over wound and ABDs used to provide addition protection.  -Dr. Leta Baptist (plastic surgery) recommends adaptic to the graft site  and Kerlix wrapping with change of dressing every other day.  - patient to f/u with Dr. Leta Baptist in 2 weeks post-discharge Prosthetic joint infection  -Continue levofloxacin per ID recommendations from 10/31  Left arm hematoma  -Patient has a stable left arm hematoma secondary to infiltration of IV line during Administration of PRBC; resolving  SOB  Patient resting sat on room air was 96%. After ambulating in hallway with walker, patient saturation was 89-92% on room air. Mild shortness of breath noted. Patient ambulated 30 feet.  -Patient does not qualify for home O2  Nausea, abd fullness - Pt reports long hx of constipation. Reports little BM recently. Little effect with oral cathartics. Ordered a trial of SMOG enema.   Code Status: Full Family Communication: Pt in room (indicate person spoken with, relationship, and if by phone, the number) Disposition Plan: Pending   Consultants:  Plastic surgery  Procedures: 10/14/2013 left subclavian triple-lumen central line placed 10/07/2013 below procedure performed by Dr. Glenna Fellows  Surgical preparation of wound bed for skin grafting 100 cm2, split thickness skin grafting to right leg ( 15 x 13 cm), application A Cell substrate to Right thigh donor site 100 cm2,   Antibiotics: The levofloxacin 11/10>>per ID note (Dr. Leodis Liverpool) from 10/31 patient is to continue on medication for several months secondary to prosthetic joint infection with GBS  HPI/Subjective: Complains of abd fullness and nausea postprandially.  Objective: Filed Vitals:   10/20/13 0846 10/20/13 1009 10/20/13 1449 10/20/13 1450  BP: 157/47 147/42 137/42   Pulse:   59   Temp:   98.6 F (37 C)   TempSrc:   Oral   Resp:   20   Height:      Weight:      SpO2:  82% 95%    Intake/Output Summary (Last 24 hours) at 10/20/13 1541 Last data filed at 10/20/13 1450  Gross per 24 hour  Intake   1234 ml  Output   1350 ml  Net   -116 ml   Filed Weights    10/18/13 0900 10/19/13 0657 10/20/13 0444  Weight: 122.018 kg (269 lb) 121.065 kg (266 lb 14.4 oz) 120.974 kg (266 lb 11.2 oz)    Exam:   General:  Awake, in nad  Cardiovascular: regular, s1, s2  Respiratory: normal resp effort, no wheezing  Abdomen: soft, obese, nontender, pos bs  Musculoskeletal: perfused, no clubbing   Data Reviewed: Basic Metabolic Panel:  Recent Labs Lab 10/16/13 0530 10/17/13 0458 10/18/13 0430 10/19/13 0535 10/20/13 0445  NA 143 142 143 141 141  K 4.2 4.2 4.1 3.9 4.3  CL 103 102 104 102 101  CO2 32 33* 34* 33* 32  GLUCOSE 78 89 93 87 78  BUN 41* 44* 42* 38* 35*  CREATININE 2.72* 2.49* 2.46* 2.13* 2.18*  CALCIUM 8.7 8.3* 8.6 8.9 9.3  MG 2.2 2.2 2.2 2.2 2.3   Liver Function Tests:  Recent Labs Lab 10/16/13 0530 10/17/13 0458 10/18/13 0430 10/19/13 0535 10/20/13 0445  AST 8 10 9 11 12   ALT 6 5 6 7 6   ALKPHOS 107 105 102 102 112  BILITOT 0.2* 0.2* 0.2* 0.3 0.3  PROT 6.8 6.9 6.8 6.9 7.5  ALBUMIN 2.4* 2.4* 2.4* 2.5* 2.8*   No results found for this basename: LIPASE, AMYLASE,  in the last 168 hours No results found for this basename: AMMONIA,  in the last 168 hours CBC:  Recent Labs Lab 10/16/13 0530 10/17/13 0458 10/18/13 0430 10/19/13 0535 10/20/13 0445  WBC 7.2 7.9 6.7 7.1 8.4  NEUTROABS 5.7 6.3 5.2 5.4 6.7  HGB 7.9* 8.0* 8.0* 8.2* 8.9*  HCT 26.0* 27.0* 27.0* 27.3* 30.3*  MCV 88.7 89.7 90.0 89.2 89.9  PLT 250 257 234 221 289   Cardiac Enzymes:  Recent Labs Lab 10/14/13 0158 10/14/13 0806  TROPONINI <0.30 <0.30   BNP (last 3 results)  Recent Labs  08/03/13 1122 10/13/13 1503 10/13/13 2041  PROBNP 1789.0* 2427.0* 2342.0*   CBG:  Recent Labs Lab 10/19/13 1206 10/19/13 1704 10/19/13 2122 10/20/13 0749 10/20/13 1157  GLUCAP 102* 110* 79 78 76    Recent Results (from the past 240 hour(s))  CULTURE, BLOOD (ROUTINE X 2)     Status: None   Collection Time    10/13/13  8:15 PM      Result Value Range  Status   Specimen Description BLOOD LEFT HAND   Final   Special Requests BOTTLES DRAWN AEROBIC AND ANAEROBIC 10CC   Final   Culture  Setup Time     Final   Value: 10/14/2013 01:07     Performed at [facility]   Culture     Final   Value: NO GROWTH 5 DAYS     Performed at [facility]   Report Status 10/20/2013 FINAL   Final  CULTURE, BLOOD (ROUTINE X 2)     Status: None   Collection Time    10/13/13  8:35 PM      Result Value Range Status   Specimen Description BLOOD LEFT ARM   Final   Special Requests BOTTLES DRAWN AEROBIC AND ANAEROBIC 10CC   Final   Culture  Setup Time     Final   Value: 10/14/2013 01:07     Performed  at [facility]     Final   Value: NO GROWTH 5 DAYS     Performed at [facility]   Report Status 10/20/2013 FINAL   Final  MRSA PCR SCREENING     Status: None   Collection Time    10/18/13  8:53 PM      Result Value Range Status   MRSA by PCR NEGATIVE  NEGATIVE Final   Comment:            The GeneXpert MRSA Assay (FDA     approved for NASAL specimens     only), is one component of a     comprehensive MRSA colonization     surveillance program. It is not     intended to diagnose MRSA     infection nor to guide or     monitor treatment for     MRSA infections.     Studies: No results found.  Scheduled Meds: . allopurinol  100 mg Oral Q breakfast  . amiodarone  200 mg Oral Q breakfast  . amLODipine  10 mg Oral Q breakfast  . aspirin  325 mg Oral Q breakfast  . atorvastatin  20 mg Oral Q24H  . ferrous sulfate  325 mg Oral TID WC  . furosemide  40 mg Oral Daily  . hydrALAZINE  75 mg Oral Q8H  . insulin aspart  0-9 Units Subcutaneous TID WC  . insulin glargine  10 Units Subcutaneous Custom  . isosorbide mononitrate  20 mg Oral BID  . levofloxacin  250 mg Oral Daily  . metoprolol tartrate  12.5 mg Oral Custom  . pantoprazole  40 mg Oral Daily  . polyethylene glycol  17 g Oral Daily  . senna-docusate  1  tablet Oral BID  . sorbitol, milk of mag, mineral oil, glycerin (SMOG) enema  960 mL Rectal Once   Continuous Infusions: . sodium chloride 20 mL/hr at 10/19/13 3474    Active Problems:   HYPERLIPIDEMIA   OBSTRUCTIVE SLEEP APNEA   HYPERTENSION   ATRIAL FIBRILLATION WITH RAPID VENTRICULAR RESPONSE   Acute combined systolic and diastolic heart failure   C V A / STROKE   DM (diabetes mellitus)   Chronic anticoagulation   CKD (chronic kidney disease), stage IV   Anemia of chronic disease   PAF (paroxysmal atrial fibrillation)   Psoriasis   Chronic combined systolic and diastolic CHF (congestive heart failure)   Hematoma complicating a procedure   Infection of prosthetic knee joint   SOB (shortness of breath)    Time spent:    CHIU, STEPHEN K  [facility] Pager [phone]. If 7PM-7AM, please contact night-coverage at [email or web address], password [facility] 10/20/2013, 3:41 PM  LOS: 7 days

## 2013-10-21 LAB — GLUCOSE, CAPILLARY: Glucose-Capillary: 126 mg/dL — ABNORMAL HIGH (ref 70–99)

## 2013-10-21 LAB — COMPREHENSIVE METABOLIC PANEL
ALT: 6 U/L (ref 0–35)
AST: 9 U/L (ref 0–37)
Alkaline Phosphatase: 104 U/L (ref 39–117)
CO2: 31 mEq/L (ref 19–32)
Calcium: 8.6 mg/dL (ref 8.4–10.5)
GFR calc Af Amer: 22 mL/min — ABNORMAL LOW (ref >90)
GFR calc non Af Amer: 19 mL/min — ABNORMAL LOW (ref >90)
Sodium: 140 mEq/L (ref 135–145)
Total Protein: 6.8 g/dL (ref 6.0–8.3)

## 2013-10-21 LAB — CBC WITH DIFFERENTIAL/PLATELET
Basophils Relative: 0 % (ref 0–1)
Eosinophils Absolute: 0.2 10*3/uL (ref 0.0–0.7)
HCT: 28 % — ABNORMAL LOW (ref 36.0–46.0)
Hemoglobin: 8.3 g/dL — ABNORMAL LOW (ref 12.0–15.0)
Lymphs Abs: 1.2 10*3/uL (ref 0.7–4.0)
MCH: 26.7 pg (ref 26.0–34.0)
MCHC: 29.6 g/dL — ABNORMAL LOW (ref 30.0–36.0)
Monocytes Absolute: 0.4 10*3/uL (ref 0.1–1.0)
Monocytes Relative: 5 % (ref 3–12)
Neutro Abs: 6.6 10*3/uL (ref 1.7–7.7)
RBC: 3.11 MIL/uL — ABNORMAL LOW (ref 3.87–5.11)

## 2013-10-21 LAB — MAGNESIUM: Magnesium: 2.3 mg/dL (ref 1.5–2.5)

## 2013-10-21 MED ORDER — LUBIPROSTONE 24 MCG PO CAPS: 24.0000 ug | ORAL_CAPSULE | Freq: Two times a day (BID) | ORAL | Status: DC

## 2013-10-21 NOTE — Progress Notes (Signed)
Report called to [name] at [facility].[name]

## 2013-10-21 NOTE — Progress Notes (Signed)
[facility] PROGRESS NOTE  [name] [name]:[id number] DOB: Sep 05, 1951 DOA: 10/13/2013 PCP: [name] [name], MD  Assessment/Plan: CHF systolic/diastolic; continue amlodipine, furosemide, hydralazine, metoprolol.  -Transition over to PO Lasix  -DC'd Zaroxolyn  -Troponin x3 (negative)  -ProBNP= 2342  -Obtain EKG; 10/13/2013; normal sinus rhythm, nonspecific ST-T wave changes in leads II, III, aVF, cannot rule out [facility]  Admission weight= 122kg (type of scale?)  -Counseled patient that cardiac rehabilitation would be of benefit; patient will consider  -Patient will need to be weighed immediately upon arrival at [facility]. This will be her dry weight. Patient will need to be weighed daily in the same manner and on the same scale  Symptomatic anemia;  -Resolved after 2-1/2 unit PRBC  A. fib with RVR; continue patient on home medications of amiodarone, currently in NSR  OSA; respiratory to provide CPAP machine; again emphasized to patient's the importance of using this machine; NOTE patient continues to refuse CPAP machine  HTN; not within AHA guidelines; currently patient on medication consistent with CHF however still slightly hypertensive. Patient's DBP extremely low we'll therefore hold medications at current level  -Continue isosorbide mononitrate  Diabetes type 2; continue home dose of Lantus.  CKD stage IV; patient close to baseline, creatinine ranges between 1.78-3.0. Psoriasis; PCP to address  Chronic anticoagulant; will hold all anticoagulants secondary to patient's recent right lower extremity hematoma and surgery requiring skin graft Skin graft right lower extremity  -Spoke with PA Shawn Rayburn (plastic surgery) gave [place] permission to change dressing prior to discharge. Dressing removed area lightly cleansed with normal saline, Xeroform placed over wound and ABDs used to provide addition protection.  -Dr. Leta Baptist (plastic surgery) recommends adaptic to the graft site  and Kerlix wrapping with change of dressing every other day.  - patient to f/u with Dr. Leta Baptist in 2 weeks post-discharge Prosthetic joint infection  -Continue levofloxacin per ID recommendations from 10/31  Left arm hematoma  -Patient has a stable left arm hematoma secondary to infiltration of IV line during Administration of PRBC; resolving  SOB  Patient resting sat on room air was 96%. After ambulating in hallway with walker, patient saturation was 89-92% on room air. Mild shortness of breath noted. Patient ambulated 30 feet.  -Patient does not qualify for home O2  Nausea, abd fullness - Pt reports long hx of constipation. Reports little BM recently. Little effect with oral cathartics. Ordered a trial of SMOG enema with large impacted stool noted. Pt still reports constipation. Will prescribe amitiza bid.   Code Status: Full Family Communication: Pt in room (indicate person spoken with, relationship, and if by phone, the number) Disposition Plan: Poss d/c today   Consultants:  Plastic surgery  Procedures: 10/14/2013 left subclavian triple-lumen central line placed 10/07/2013 below procedure performed by Dr. Glenna Fellows  Surgical preparation of wound bed for skin grafting 100 cm2, split thickness skin grafting to right leg ( 15 x 13 cm), application A Cell substrate to Right thigh donor site 100 cm2,   Antibiotics: The levofloxacin 11/10>>per ID note (Dr. Leodis Liverpool) from 10/31 patient is to continue on medication for several months secondary to prosthetic joint infection with GBS  HPI/Subjective: Reports feeling better post SMOG enema, but still feels constipated.  Objective: Filed Vitals:   10/20/13 1450 10/20/13 2153 10/21/13 0447 10/21/13 0759  BP:  152/52 140/43 145/43  Pulse:  50 55 58  Temp:  97.9 F (36.6 C) 98.3 F (36.8 C)   TempSrc:  Oral Oral  Resp:  20 20   Height:      Weight:   123 kg (271 lb 2.7 oz)   SpO2: 95% 97% 97%     Intake/Output  Summary (Last 24 hours) at 10/21/13 0924 Last data filed at 10/21/13 0450  Gross per 24 hour  Intake    896 ml  Output   1025 ml  Net   -129 ml   Filed Weights   10/19/13 0657 10/20/13 0444 10/21/13 0447  Weight: 121.065 kg (266 lb 14.4 oz) 120.974 kg (266 lb 11.2 oz) 123 kg (271 lb 2.7 oz)    Exam:   General:  Awake, in nad  Cardiovascular: regular, s1, s2  Respiratory: normal resp effort, no wheezing  Abdomen: soft, obese, nontender, pos bs  Musculoskeletal: perfused, no clubbing   Data Reviewed: Basic Metabolic Panel:  Recent Labs Lab 10/17/13 0458 10/18/13 0430 10/19/13 0535 10/20/13 0445 10/21/13 0426  NA 142 143 141 141 140  K 4.2 4.1 3.9 4.3 4.2  CL 102 104 102 101 101  CO2 33* 34* 33* 32 31  GLUCOSE 89 93 87 78 95  BUN 44* 42* 38* 35* 42*  CREATININE 2.49* 2.46* 2.13* 2.18* 2.54*  CALCIUM 8.3* 8.6 8.9 9.3 8.6  MG 2.2 2.2 2.2 2.3 2.3   Liver Function Tests:  Recent Labs Lab 10/17/13 0458 10/18/13 0430 10/19/13 0535 10/20/13 0445 10/21/13 0426  AST 10 9 11 12 9   ALT 5 6 7 6 6   ALKPHOS 105 102 102 112 104  BILITOT 0.2* 0.2* 0.3 0.3 0.2*  PROT 6.9 6.8 6.9 7.5 6.8  ALBUMIN 2.4* 2.4* 2.5* 2.8* 2.5*   No results found for this basename: LIPASE, AMYLASE,  in the last 168 hours No results found for this basename: AMMONIA,  in the last 168 hours CBC:  Recent Labs Lab 10/17/13 0458 10/18/13 0430 10/19/13 0535 10/20/13 0445 10/21/13 0426  WBC 7.9 6.7 7.1 8.4 8.4  NEUTROABS 6.3 5.2 5.4 6.7 6.6  HGB 8.0* 8.0* 8.2* 8.9* 8.3*  HCT 27.0* 27.0* 27.3* 30.3* 28.0*  MCV 89.7 90.0 89.2 89.9 90.0  PLT 257 234 221 289 262   Cardiac Enzymes: No results found for this basename: CKTOTAL, CKMB, CKMBINDEX, TROPONINI,  in the last 168 hours BNP (last 3 results)  Recent Labs  08/03/13 1122 10/13/13 1503 10/13/13 2041  PROBNP 1789.0* 2427.0* 2342.0*   CBG:  Recent Labs Lab 10/20/13 0749 10/20/13 1157 10/20/13 1658 10/20/13 2142 10/21/13 0741   GLUCAP 78 76 105* 97 88    Recent Results (from the past 240 hour(s))  CULTURE, BLOOD (ROUTINE X 2)     Status: None   Collection Time    10/13/13  8:15 PM      Result Value Range Status   Specimen Description BLOOD LEFT HAND   Final   Special Requests BOTTLES DRAWN AEROBIC AND ANAEROBIC 10CC   Final   Culture  Setup Time     Final   Value: 10/14/2013 01:07     Performed at [facility]   Culture     Final   Value: NO GROWTH 5 DAYS     Performed at [facility]   Report Status 10/20/2013 FINAL   Final  CULTURE, BLOOD (ROUTINE X 2)     Status: None   Collection Time    10/13/13  8:35 PM      Result Value Range Status   Specimen Description BLOOD LEFT ARM   Final   Special  Requests BOTTLES DRAWN AEROBIC AND ANAEROBIC 10CC   Final   Culture  Setup Time     Final   Value: 10/14/2013 01:07     Performed at [facility]   Culture     Final   Value: NO GROWTH 5 DAYS     Performed at [facility]   Report Status 10/20/2013 FINAL   Final  MRSA PCR SCREENING     Status: None   Collection Time    10/18/13  8:53 PM      Result Value Range Status   MRSA by PCR NEGATIVE  NEGATIVE Final   Comment:            The GeneXpert MRSA Assay (FDA     approved for NASAL specimens     only), is one component of a     comprehensive MRSA colonization     surveillance program. It is not     intended to diagnose MRSA     infection nor to guide or     monitor treatment for     MRSA infections.     Studies: No results found.  Scheduled Meds: . allopurinol  100 mg Oral Q breakfast  . amiodarone  200 mg Oral Q breakfast  . amLODipine  10 mg Oral Q breakfast  . aspirin  325 mg Oral Q breakfast  . atorvastatin  20 mg Oral Q24H  . ferrous sulfate  325 mg Oral TID WC  . furosemide  40 mg Oral Daily  . hydrALAZINE  75 mg Oral Q8H  . insulin aspart  0-9 Units Subcutaneous TID WC  . insulin glargine  10 Units Subcutaneous Custom  . isosorbide mononitrate  20 mg  Oral BID  . levofloxacin  250 mg Oral Daily  . metoprolol tartrate  12.5 mg Oral Custom  . pantoprazole  40 mg Oral Daily  . polyethylene glycol  17 g Oral Daily  . senna-docusate  1 tablet Oral BID   Continuous Infusions: . sodium chloride 20 mL/hr at 10/20/13 1815    Active Problems:   HYPERLIPIDEMIA   OBSTRUCTIVE SLEEP APNEA   HYPERTENSION   ATRIAL FIBRILLATION WITH RAPID VENTRICULAR RESPONSE   Acute combined systolic and diastolic heart failure   C V A / STROKE   DM (diabetes mellitus)   Chronic anticoagulation   CKD (chronic kidney disease), stage IV   Anemia of chronic disease   PAF (paroxysmal atrial fibrillation)   Psoriasis   Chronic combined systolic and diastolic CHF (congestive heart failure)   Hematoma complicating a procedure   Infection of prosthetic knee joint   SOB (shortness of breath)    Time spent:    CHIU, STEPHEN K  [facility] Pager [phone]. If 7PM-7AM, please contact night-coverage at [email or web address], password [facility] 10/21/2013, 9:24 AM  LOS: 8 days

## 2013-10-22 ENCOUNTER — Telehealth: Payer: Self-pay | Admitting: Internal Medicine

## 2013-11-09 ENCOUNTER — Emergency Department (HOSPITAL_COMMUNITY): Payer: Medicare HMO

## 2013-11-09 ENCOUNTER — Inpatient Hospital Stay (HOSPITAL_COMMUNITY)
Admission: EM | Admit: 2013-11-09 | Discharge: 2013-11-16 | DRG: 291 | Disposition: A | Discharge status: Skilled Nursing Facility | Payer: Medicare HMO | Attending: Internal Medicine | Admitting: Internal Medicine

## 2013-11-09 ENCOUNTER — Encounter (HOSPITAL_COMMUNITY): Payer: Self-pay | Admitting: Emergency Medicine

## 2013-11-09 DIAGNOSIS — Z8673 Personal history of transient ischemic attack (TIA), and cerebral infarction without residual deficits: Secondary | ICD-10-CM

## 2013-11-09 DIAGNOSIS — I129 Hypertensive chronic kidney disease with stage 1 through stage 4 chronic kidney disease, or unspecified chronic kidney disease: Secondary | ICD-10-CM | POA: Diagnosis present

## 2013-11-09 DIAGNOSIS — E662 Morbid (severe) obesity with alveolar hypoventilation: Secondary | ICD-10-CM | POA: Diagnosis present

## 2013-11-09 DIAGNOSIS — E119 Type 2 diabetes mellitus without complications: Secondary | ICD-10-CM | POA: Diagnosis present

## 2013-11-09 DIAGNOSIS — I4891 Unspecified atrial fibrillation: Secondary | ICD-10-CM | POA: Diagnosis present

## 2013-11-09 DIAGNOSIS — N184 Chronic kidney disease, stage 4 (severe): Secondary | ICD-10-CM

## 2013-11-09 DIAGNOSIS — I480 Paroxysmal atrial fibrillation: Secondary | ICD-10-CM | POA: Diagnosis present

## 2013-11-09 DIAGNOSIS — S81801S Unspecified open wound, right lower leg, sequela: Secondary | ICD-10-CM

## 2013-11-09 DIAGNOSIS — F329 Major depressive disorder, single episode, unspecified: Secondary | ICD-10-CM | POA: Diagnosis present

## 2013-11-09 DIAGNOSIS — Z7901 Long term (current) use of anticoagulants: Secondary | ICD-10-CM

## 2013-11-09 DIAGNOSIS — L408 Other psoriasis: Secondary | ICD-10-CM | POA: Diagnosis present

## 2013-11-09 DIAGNOSIS — I509 Heart failure, unspecified: Secondary | ICD-10-CM | POA: Diagnosis present

## 2013-11-09 DIAGNOSIS — Z87442 Personal history of urinary calculi: Secondary | ICD-10-CM

## 2013-11-09 DIAGNOSIS — M109 Gout, unspecified: Secondary | ICD-10-CM | POA: Diagnosis present

## 2013-11-09 DIAGNOSIS — D638 Anemia in other chronic diseases classified elsewhere: Secondary | ICD-10-CM | POA: Diagnosis present

## 2013-11-09 DIAGNOSIS — Z96659 Presence of unspecified artificial knee joint: Secondary | ICD-10-CM

## 2013-11-09 DIAGNOSIS — E785 Hyperlipidemia, unspecified: Secondary | ICD-10-CM | POA: Diagnosis present

## 2013-11-09 DIAGNOSIS — L299 Pruritus, unspecified: Secondary | ICD-10-CM | POA: Diagnosis present

## 2013-11-09 DIAGNOSIS — K6289 Other specified diseases of anus and rectum: Secondary | ICD-10-CM | POA: Diagnosis present

## 2013-11-09 DIAGNOSIS — F411 Generalized anxiety disorder: Secondary | ICD-10-CM | POA: Diagnosis present

## 2013-11-09 DIAGNOSIS — R110 Nausea: Secondary | ICD-10-CM | POA: Diagnosis not present

## 2013-11-09 DIAGNOSIS — Z9981 Dependence on supplemental oxygen: Secondary | ICD-10-CM

## 2013-11-09 DIAGNOSIS — K3184 Gastroparesis: Secondary | ICD-10-CM | POA: Diagnosis present

## 2013-11-09 DIAGNOSIS — K5900 Constipation, unspecified: Secondary | ICD-10-CM | POA: Diagnosis present

## 2013-11-09 DIAGNOSIS — S81801A Unspecified open wound, right lower leg, initial encounter: Secondary | ICD-10-CM

## 2013-11-09 DIAGNOSIS — F3289 Other specified depressive episodes: Secondary | ICD-10-CM | POA: Diagnosis present

## 2013-11-09 DIAGNOSIS — L97809 Non-pressure chronic ulcer of other part of unspecified lower leg with unspecified severity: Secondary | ICD-10-CM | POA: Diagnosis present

## 2013-11-09 DIAGNOSIS — Z825 Family history of asthma and other chronic lower respiratory diseases: Secondary | ICD-10-CM

## 2013-11-09 DIAGNOSIS — J9600 Acute respiratory failure, unspecified whether with hypoxia or hypercapnia: Secondary | ICD-10-CM | POA: Diagnosis present

## 2013-11-09 DIAGNOSIS — I5043 Acute on chronic combined systolic (congestive) and diastolic (congestive) heart failure: Principal | ICD-10-CM | POA: Diagnosis present

## 2013-11-09 DIAGNOSIS — Z7982 Long term (current) use of aspirin: Secondary | ICD-10-CM

## 2013-11-09 DIAGNOSIS — I5041 Acute combined systolic (congestive) and diastolic (congestive) heart failure: Secondary | ICD-10-CM | POA: Diagnosis present

## 2013-11-09 DIAGNOSIS — I6789 Other cerebrovascular disease: Secondary | ICD-10-CM | POA: Diagnosis present

## 2013-11-09 DIAGNOSIS — G4733 Obstructive sleep apnea (adult) (pediatric): Secondary | ICD-10-CM | POA: Diagnosis present

## 2013-11-09 DIAGNOSIS — J189 Pneumonia, unspecified organism: Secondary | ICD-10-CM | POA: Diagnosis present

## 2013-11-09 DIAGNOSIS — Z800 Family history of malignant neoplasm of digestive organs: Secondary | ICD-10-CM

## 2013-11-09 DIAGNOSIS — Z9119 Patient's noncompliance with other medical treatment and regimen: Secondary | ICD-10-CM

## 2013-11-09 DIAGNOSIS — Z91199 Patient's noncompliance with other medical treatment and regimen due to unspecified reason: Secondary | ICD-10-CM

## 2013-11-09 DIAGNOSIS — K219 Gastro-esophageal reflux disease without esophagitis: Secondary | ICD-10-CM | POA: Diagnosis present

## 2013-11-09 DIAGNOSIS — Z8249 Family history of ischemic heart disease and other diseases of the circulatory system: Secondary | ICD-10-CM

## 2013-11-09 DIAGNOSIS — Z87891 Personal history of nicotine dependence: Secondary | ICD-10-CM

## 2013-11-09 DIAGNOSIS — E1129 Type 2 diabetes mellitus with other diabetic kidney complication: Secondary | ICD-10-CM | POA: Diagnosis present

## 2013-11-09 DIAGNOSIS — M1990 Unspecified osteoarthritis, unspecified site: Secondary | ICD-10-CM | POA: Diagnosis present

## 2013-11-09 DIAGNOSIS — Z6841 Body Mass Index (BMI) 40.0 and over, adult: Secondary | ICD-10-CM

## 2013-11-09 LAB — CBC WITH DIFFERENTIAL/PLATELET
Eosinophils Absolute: 0.1 10*3/uL (ref 0.0–0.7)
Eosinophils Relative: 1 % (ref 0–5)
HCT: 26.7 % — ABNORMAL LOW (ref 36.0–46.0)
Hemoglobin: 8 g/dL — ABNORMAL LOW (ref 12.0–15.0)
Lymphs Abs: 0.6 10*3/uL — ABNORMAL LOW (ref 0.7–4.0)
MCH: 26.2 pg (ref 26.0–34.0)
MCV: 87.5 fL (ref 78.0–100.0)
Monocytes Absolute: 0.5 10*3/uL (ref 0.1–1.0)
Monocytes Relative: 5 % (ref 3–12)
Platelets: 225 10*3/uL (ref 150–400)
RBC: 3.05 MIL/uL — ABNORMAL LOW (ref 3.87–5.11)

## 2013-11-09 LAB — COMPREHENSIVE METABOLIC PANEL
ALT: 8 U/L (ref 0–35)
BUN: 31 mg/dL — ABNORMAL HIGH (ref 6–23)
Calcium: 8.7 mg/dL (ref 8.4–10.5)
Creatinine, Ser: 2.01 mg/dL — ABNORMAL HIGH (ref 0.50–1.10)
GFR calc Af Amer: 29 mL/min — ABNORMAL LOW (ref >90)
GFR calc non Af Amer: 25 mL/min — ABNORMAL LOW (ref >90)
Glucose, Bld: 132 mg/dL — ABNORMAL HIGH (ref 70–99)
Sodium: 140 mEq/L (ref 135–145)
Total Protein: 7.4 g/dL (ref 6.0–8.3)

## 2013-11-09 LAB — URINE MICROSCOPIC-ADD ON

## 2013-11-09 LAB — URINALYSIS, ROUTINE W REFLEX MICROSCOPIC
Nitrite: NEGATIVE
Specific Gravity, Urine: 1.013 (ref 1.005–1.030)
Urobilinogen, UA: 0.2 mg/dL (ref 0.0–1.0)
pH: 5 (ref 5.0–8.0)

## 2013-11-09 LAB — PRO B NATRIURETIC PEPTIDE: Pro B Natriuretic peptide (BNP): 3130 pg/mL — ABNORMAL HIGH (ref 0–125)

## 2013-11-09 LAB — GLUCOSE, CAPILLARY: Glucose-Capillary: 149 mg/dL — ABNORMAL HIGH (ref 70–99)

## 2013-11-09 LAB — TROPONIN I: Troponin I: <0.3 ng/mL (ref <0.30)

## 2013-11-09 MED ORDER — DIPHENHYDRAMINE HCL 25 MG PO CAPS
25.0000 mg | ORAL_CAPSULE | Freq: Once | ORAL | Status: AC | PRN
  Administered 2013-11-10: 02:00:00 25 mg via ORAL

## 2013-11-09 MED ORDER — VANCOMYCIN HCL 10 G IV SOLR: 1750.0000 mg | INTRAVENOUS | Status: DC

## 2013-11-09 MED ORDER — ATORVASTATIN CALCIUM 20 MG PO TABS
20.0000 mg | ORAL_TABLET | Freq: Every day | ORAL | Status: DC
  Administered 2013-11-09 – 2013-11-15 (×7): 20 mg via ORAL
  Filled 2013-11-09 (×9): qty 1

## 2013-11-09 MED ORDER — OXYCODONE-ACETAMINOPHEN 5-325 MG PO TABS
1.0000 | ORAL_TABLET | ORAL | Status: DC | PRN
  Administered 2013-11-10: 1 via ORAL
  Administered 2013-11-13 – 2013-11-14 (×2): 2 via ORAL
  Filled 2013-11-09: qty 2
  Filled 2013-11-09: qty 1
  Filled 2013-11-09: qty 2

## 2013-11-09 MED ORDER — DEXTROSE 5 % IV SOLN
1.0000 g | INTRAVENOUS | Status: DC
Start: 2013-11-09 — End: 2013-11-10
  Filled 2013-11-09: qty 1

## 2013-11-09 MED ORDER — ENOXAPARIN SODIUM 40 MG/0.4ML ~~LOC~~ SOLN
40.0000 mg | SUBCUTANEOUS | Status: DC
  Administered 2013-11-09 – 2013-11-15 (×7): 40 mg via SUBCUTANEOUS
  Filled 2013-11-09 (×9): qty 0.4

## 2013-11-09 MED ORDER — AMLODIPINE BESYLATE 10 MG PO TABS
10.0000 mg | ORAL_TABLET | Freq: Every day | ORAL | Status: DC
  Administered 2013-11-10 – 2013-11-15 (×6): 10 mg via ORAL
  Filled 2013-11-09 (×10): qty 1

## 2013-11-09 MED ORDER — DEXTROSE 5 % IV SOLN
1.0000 g | Freq: Three times a day (TID) | INTRAVENOUS | Status: DC
  Filled 2013-11-09 (×2): qty 1

## 2013-11-09 MED ORDER — POTASSIUM CHLORIDE ER 10 MEQ PO TBCR
20.0000 meq | EXTENDED_RELEASE_TABLET | Freq: Every day | ORAL | Status: DC
  Administered 2013-11-10 – 2013-11-16 (×7): 20 meq via ORAL
  Filled 2013-11-09 (×7): qty 2

## 2013-11-09 MED ORDER — ONDANSETRON HCL 4 MG/2ML IJ SOLN
4.0000 mg | Freq: Four times a day (QID) | INTRAMUSCULAR | Status: DC | PRN
  Administered 2013-11-11 – 2013-11-16 (×7): 4 mg via INTRAVENOUS
  Filled 2013-11-09 (×7): qty 2

## 2013-11-09 MED ORDER — INSULIN ASPART 100 UNIT/ML ~~LOC~~ SOLN
0.0000 [IU] | Freq: Three times a day (TID) | SUBCUTANEOUS | Status: DC
  Administered 2013-11-16: 12:00:00 2 [IU] via SUBCUTANEOUS

## 2013-11-09 MED ORDER — AMIODARONE HCL 200 MG PO TABS
200.0000 mg | ORAL_TABLET | Freq: Every day | ORAL | Status: DC
  Administered 2013-11-10 – 2013-11-16 (×7): 200 mg via ORAL
  Filled 2013-11-09 (×7): qty 1

## 2013-11-09 MED ORDER — METOPROLOL TARTRATE 12.5 MG HALF TABLET
12.5000 mg | ORAL_TABLET | Freq: Two times a day (BID) | ORAL | Status: DC
  Administered 2013-11-09 – 2013-11-16 (×14): 12.5 mg via ORAL
  Filled 2013-11-09 (×17): qty 1

## 2013-11-09 MED ORDER — INSULIN GLARGINE 100 UNIT/ML ~~LOC~~ SOLN
20.0000 [IU] | Freq: Two times a day (BID) | SUBCUTANEOUS | Status: DC
  Administered 2013-11-09 – 2013-11-13 (×8): 20 [IU] via SUBCUTANEOUS
  Filled 2013-11-09 (×10): qty 0.2

## 2013-11-09 MED ORDER — HYDRALAZINE HCL 50 MG PO TABS
50.0000 mg | ORAL_TABLET | Freq: Three times a day (TID) | ORAL | Status: DC
  Administered 2013-11-09 – 2013-11-16 (×21): 50 mg via ORAL
  Filled 2013-11-09 (×23): qty 1

## 2013-11-09 MED ORDER — ACETAMINOPHEN 325 MG PO TABS: 650.0000 mg | ORAL_TABLET | Freq: Four times a day (QID) | ORAL | Status: DC | PRN

## 2013-11-09 MED ORDER — VANCOMYCIN HCL 10 G IV SOLR
2000.0000 mg | INTRAVENOUS | Status: DC
  Filled 2013-11-09: qty 2000

## 2013-11-09 MED ORDER — ALBUTEROL SULFATE HFA 108 (90 BASE) MCG/ACT IN AERS
2.0000 | INHALATION_SPRAY | Freq: Four times a day (QID) | RESPIRATORY_TRACT | Status: DC | PRN
  Administered 2013-11-12 – 2013-11-14 (×2): 2 via RESPIRATORY_TRACT
  Filled 2013-11-09: qty 6.7

## 2013-11-09 MED ORDER — ASPIRIN 325 MG PO TABS
325.0000 mg | ORAL_TABLET | Freq: Every day | ORAL | Status: DC
  Administered 2013-11-10 – 2013-11-16 (×7): 325 mg via ORAL
  Filled 2013-11-09 (×7): qty 1

## 2013-11-09 MED ORDER — PANTOPRAZOLE SODIUM 40 MG PO TBEC
40.0000 mg | DELAYED_RELEASE_TABLET | Freq: Every day | ORAL | Status: DC
  Administered 2013-11-09 – 2013-11-16 (×8): 40 mg via ORAL
  Filled 2013-11-09 (×8): qty 1

## 2013-11-09 MED ORDER — FUROSEMIDE 40 MG PO TABS
40.0000 mg | ORAL_TABLET | Freq: Three times a day (TID) | ORAL | Status: DC
  Administered 2013-11-09 – 2013-11-10 (×2): 40 mg via ORAL
  Filled 2013-11-09 (×5): qty 1

## 2013-11-09 MED ORDER — TRAZODONE 25 MG HALF TABLET
25.0000 mg | ORAL_TABLET | Freq: Every day | ORAL | Status: DC
  Administered 2013-11-09 – 2013-11-15 (×7): 25 mg via ORAL
  Filled 2013-11-09 (×9): qty 1

## 2013-11-09 MED ORDER — FUROSEMIDE 10 MG/ML IJ SOLN: 40.0000 mg | Freq: Once | INTRAMUSCULAR | Status: DC

## 2013-11-09 MED ORDER — FUROSEMIDE 10 MG/ML IJ SOLN
40.0000 mg | Freq: Three times a day (TID) | INTRAMUSCULAR | Status: DC
  Filled 2013-11-09 (×3): qty 4

## 2013-11-09 MED ORDER — FERROUS SULFATE 325 (65 FE) MG PO TABS
325.0000 mg | ORAL_TABLET | Freq: Three times a day (TID) | ORAL | Status: DC
  Administered 2013-11-10 – 2013-11-16 (×19): 325 mg via ORAL
  Filled 2013-11-09 (×22): qty 1

## 2013-11-09 MED ORDER — DOCUSATE SODIUM 100 MG PO CAPS
100.0000 mg | ORAL_CAPSULE | Freq: Two times a day (BID) | ORAL | Status: DC
  Administered 2013-11-09 – 2013-11-16 (×14): 100 mg via ORAL
  Filled 2013-11-09 (×16): qty 1

## 2013-11-09 MED ORDER — ALLOPURINOL 100 MG PO TABS
100.0000 mg | ORAL_TABLET | Freq: Every day | ORAL | Status: DC
  Administered 2013-11-10 – 2013-11-16 (×7): 100 mg via ORAL
  Filled 2013-11-09 (×7): qty 1

## 2013-11-09 NOTE — ED Notes (Signed)
Per EMS, pt comes from [facility]. Pt was having difficulty breathing for 3 days. Nursing home performed chest x-ray, which they stated showed pneumonia. Pt has hx COPD, is on 2L O2 at home. EMS sat patient up, and gave nebulizer treatment, which increased pt's sat from 86 to 99. Pt A&OX4

## 2013-11-09 NOTE — ED Provider Notes (Signed)
CSN: [id number]     Arrival date & time 11/09/13  1440 History   First MD Initiated Contact with Patient 11/09/13 1458     No chief complaint on file.  (Consider location/radiation/quality/duration/timing/severity/associated sxs/prior Treatment) HPI Comments: Patient brought to the ER from nursing home by EMS. Patient reportedly has been experiencing shortness of breath for the last 3 days. This has been accompanied by a nonproductive cough. She denies chest pain. She does report that when she lies flat her breathing gets much worse. She does have a history of COPD as well as congestive heart failure. EMS administered a nebulizer during transport, she reports that is slightly improved her breathing. EMS reports that her initial oxygen saturation was 86% on 2 L. With the nebulizer improved to 99%.   Past Medical History  Diagnosis Date  . Asthma   . Bronchitis   . Allergic rhinitis   . HTN (hypertension)   . Hyperlipidemia   . Obesity   . OSA (obstructive sleep apnea)     poor compliance with cpap  . CHF (congestive heart failure)   . Biventricular failure     compensated  . Psoriasis   . Stasis edema     of lower extremities  . Depression   . Situational stress   . Anemia   . Gout   . Chronic anticoagulation   . Yeast infection involving the vagina and surrounding area   . Atrial fibrillation with RVR   . GERD (gastroesophageal reflux disease)   . Chronic bronchitis     "get it q yr" (08/10/2013)  . Pneumonia     "said I did on 08/03/2013 then they ruled it out" (08/10/2013)  . Shortness of breath     "all the time" (08/10/2013)  . On home oxygen therapy     "2L during the night and prn during the day" (08/10/2013)  . Type II diabetes mellitus   . History of blood transfusion     "few during my lifetime; last time was 4 days ago when I got 2 units" (08/10/2013)  . Stroke ?2007    denies residuals on 08/10/2013  . DJD (degenerative joint disease)   . Bleeding     "from my skin  folds; just won't stop" (08/10/2013)  . Complication of anesthesia     "they gave me too much; I stayed out of it for awhile" (08/10/2013)  . Anxiety   . Kidney stones   . Kidney disease, chronic, stage III (GFR 30-59 ml/min)   . Chronic kidney disease, stage IV (severe)   . Morbid obesity    Past Surgical History  Procedure Laterality Date  . Appendectomy    . Cholecystectomy    . Total knee arthroplasty Left ~ 2006  . Total abdominal hysterectomy    . Back surgery    . Cystectomy Left     hand  . Tonsillectomy    . Joint replacement    . Lumbar disc surgery    . Colonoscopy N/A 08/16/2013    Procedure: COLONOSCOPY;  Surgeon: Theda Belfast, MD;  Location: [facility] ENDOSCOPY;  Service: Endoscopy;  Laterality: N/A;  . Hematoma evacuation Right 09/10/2013    Procedure: EVACUATION OF RIGHT LEG HEMATOMA AND EXCISION DEBRIDIMENT RIGHT LEG ;  Surgeon: Robyne Askew, MD;  Location: [facility]S;  Service: General;  Laterality: Right;  . Debridement and closure wound Right 09/22/2013    Procedure: IRRIGATION AND DEBRIDEMENT SURGICAL PREP PLACEMENT OF ACELL AND VAC  ;  Surgeon: Glenna Fellows, MD;  Location: [facility];  Service: Plastics;  Laterality: Right;  . Skin split graft Right 10/07/2013    Procedure: SPLIT THICKNESS SKIN GRAFT RIGHT THIGH TO RIGHT LEG, PLACEMENT OF A CEL AND WOUND VAC TO RIGHT THIGH  ;  Surgeon: Glenna Fellows, MD;  Location: [facility];  Service: Plastics;  Laterality: Right;   Family History  Problem Relation Age of Onset  . Heart attack Father   . Asthma Father   . Heart disease Paternal Uncle   . Rectal cancer Paternal Aunt   . Other Mother     mva   History  Substance Use Topics  . Smoking status: Former Smoker -- 0.12 packs/day for 12 years    Types: Cigarettes    Quit date: 07/05/1982  . Smokeless tobacco: Never Used     Comment: smoked for 1.5 yrs about 2 cigs a day ,only if stressed  . Alcohol Use: No   OB History   Grav Para Term Preterm Abortions TAB SAB Ect  Mult Living                 Review of Systems  Respiratory: Positive for cough and shortness of breath.   Cardiovascular: Positive for leg swelling. Negative for chest pain.  All other systems reviewed and are negative.    Allergies  Daptomycin; Morphine and related; Cephalexin; Celecoxib; Codeine; Fluoxetine hcl; Latex; Ofloxacin; Penicillins; Rofecoxib; Sulfonamide derivatives; and Tramadol  Home Medications   Current Outpatient Rx  Name  Route  Sig  Dispense  Refill  . albuterol (PROVENTIL HFA;VENTOLIN HFA) 108 (90 BASE) MCG/ACT inhaler   Inhalation   Inhale 2 puffs into the lungs every 6 (six) hours as needed for wheezing or shortness of breath.          . allopurinol (ZYLOPRIM) 100 MG tablet   Oral   Take 100 mg by mouth daily. Patient takes at 0800am daily         . amiodarone (PACERONE) 200 MG tablet   Oral   Take 200 mg by mouth daily. Patient takes at 0800am daily         . amLODipine (NORVASC) 10 MG tablet   Oral   Take 10 mg by mouth daily. Patient takes at 0800am daily         . atorvastatin (LIPITOR) 20 MG tablet   Oral   Take 20 mg by mouth at bedtime. Patient takes at 2100         . ferrous sulfate 325 (65 FE) MG tablet   Oral   Take 325 mg by mouth 3 (three) times daily with meals. Patient takes at 0800, 1200, and 1700         . furosemide (LASIX) 40 MG tablet   Oral   Take 40 mg by mouth daily. Patient takes at 0800am daily         . furosemide (LASIX) 40 MG tablet   Oral   Take 1 tablet (40 mg total) by mouth daily.   30 tablet   0   . hydrALAZINE (APRESOLINE) 10 MG tablet   Oral   Take 7.5 tablets (75 mg total) by mouth 3 (three) times daily.   90 tablet   0   . insulin glargine (LANTUS) 100 UNIT/ML injection   Subcutaneous   Inject 0.1 mLs (10 Units total) into the skin 2 (two) times daily.   10 mL   0   . isosorbide mononitrate (ISMO,MONOKET) 20 MG  tablet   Oral   Take 1 tablet (20 mg total) by mouth 2 (two) times  daily at 10 AM and 5 PM.   60 tablet   0   . levofloxacin (LEVAQUIN) 250 MG tablet   Oral   Take 1 tablet (250 mg total) by mouth daily.   30 tablet   0   . lubiprostone (AMITIZA) 24 MCG capsule   Oral   Take 1 capsule (24 mcg total) by mouth 2 (two) times daily with a meal.   60 capsule   0   . metoprolol tartrate (LOPRESSOR) 12.5 mg TABS tablet   Oral   Take 0.5 tablets (12.5 mg total) by mouth 2 (two) times daily.   60 each   0   . ondansetron (ZOFRAN) 4 MG tablet   Oral   Take 4 mg by mouth every 6 (six) hours as needed for nausea.         . ondansetron (ZOFRAN) 4 MG tablet   Oral   Take 1 tablet (4 mg total) by mouth every 6 (six) hours as needed for nausea.   20 tablet   0   . oxyCODONE-acetaminophen (PERCOCET/ROXICET) 5-325 MG per tablet   Oral   Take 1-2 tablets by mouth every 4 (four) hours as needed for moderate pain or severe pain (1 tablet for pain rated 1 through 5.  2 tablets for pain rated 6 to 10.).         [name] pantoprazole (PROTONIX) 40 MG tablet   Oral   Take 40 mg by mouth daily.         . polyethylene glycol (MIRALAX / GLYCOLAX) packet   Oral   Take 17 g by mouth daily.   14 each   0   . senna-docusate (SENOKOT-S) 8.6-50 MG per tablet   Oral   Take 1 tablet by mouth 2 (two) times daily.   60 tablet   0   . zolpidem (AMBIEN) 5 MG tablet   Oral   Take 5 mg by mouth at bedtime as needed for sleep.           BP 148/40  Pulse 84  Temp(Src) 99.1 F (37.3 C) (Oral)  Resp 26  SpO2 93% Physical Exam  Constitutional: She is oriented to person, place, and time. She appears well-developed and well-nourished. She appears distressed.  HENT:  Head: Normocephalic and atraumatic.  Right Ear: Hearing normal.  Left Ear: Hearing normal.  Nose: Nose normal.  Mouth/Throat: Oropharynx is clear and moist and mucous membranes are normal.  Eyes: Conjunctivae and EOM are normal. Pupils are equal, round, and reactive to light.  Neck: Normal range  of motion. Neck supple.  Cardiovascular: Regular rhythm, S1 normal and S2 normal.  Exam reveals no gallop and no friction rub.   No murmur heard. Pulmonary/Chest: Accessory muscle usage present. Tachypnea noted. She has decreased breath sounds. She exhibits no tenderness.  Abdominal: Soft. Normal appearance and bowel sounds are normal. There is no hepatosplenomegaly. There is no tenderness. There is no rebound, no guarding, no tenderness at McBurney's point and negative Murphy's sign. No hernia.  Musculoskeletal: Normal range of motion. She exhibits edema.  Neurological: She is alert and oriented to person, place, and time. She has normal strength. No cranial nerve deficit or sensory deficit. Coordination normal. GCS eye subscore is 4. GCS verbal subscore is 5. GCS motor subscore is 6.  Skin: Skin is warm, dry and intact. No rash noted. No cyanosis.  Edematous, Thickened, hyperpigmented lower legs with no erythema  Psychiatric: She has a normal mood and affect. Her speech is normal and behavior is normal. Thought content normal.    ED Course  Procedures (including critical care time) Labs Review Labs Reviewed  CBC WITH DIFFERENTIAL - Abnormal; Notable for the following:    RBC 3.05 (*)    Hemoglobin 8.0 (*)    HCT 26.7 (*)    RDW 17.5 (*)    Neutrophils Relative % 87 (*)    Neutro Abs 8.8 (*)    Lymphocytes Relative 6 (*)    Lymphs Abs 0.6 (*)    All other components within normal limits  COMPREHENSIVE METABOLIC PANEL - Abnormal; Notable for the following:    Glucose, Bld 132 (*)    BUN 31 (*)    Creatinine, Ser 2.01 (*)    Albumin 2.6 (*)    GFR calc non Af Amer 25 (*)    GFR calc Af Amer 29 (*)    All other components within normal limits  PRO B NATRIURETIC PEPTIDE - Abnormal; Notable for the following:    Pro B Natriuretic peptide (BNP) 3130.0 (*)    All other components within normal limits  CULTURE, BLOOD (ROUTINE X 2)  CULTURE, BLOOD (ROUTINE X 2)  TROPONIN I   URINALYSIS, ROUTINE W REFLEX MICROSCOPIC  CG4 I-STAT (LACTIC ACID)  TYPE AND SCREEN   Imaging Review Dg Chest Port 1 View  11/09/2013   CLINICAL DATA:  Shortness of Breath  EXAM: PORTABLE CHEST - 1 VIEW  COMPARISON:  10/14/2013  FINDINGS: Cardiomegaly again noted. Bilateral hazy airspace disease which may be due to interstitial edema or bilateral pneumonia.  IMPRESSION: Bilateral airspace disease which may be due to bilateral pneumonia or bilateral pulmonary edema. Cardiomegaly again noted.   Electronically Signed   By: Natasha Mead M.D.   On: 11/09/2013 15:28    EKG Interpretation    Date/Time:  Tuesday November 09 2013 15:00:30 EST Ventricular Rate:  83 PR Interval:  146 QRS Duration: 77 QT Interval:  379 QTC Calculation: 445 R Axis:   59 Text Interpretation:  Sinus rhythm Atrial premature complex Probable left atrial enlargement Borderline T abnormalities, diffuse leads No significant change since last tracing Confirmed by Joram Venson  MD, Vanshika Jastrzebski ([id number]) on 11/09/2013 3:09:28 PM            MDM  Diagnosis: 1. Congestive heart failure 2. Possible pneumonia  Patient presents to the ER for evaluation of difficulty breathing. Patient has a previous history of congestive heart failure as well as chronic lung disease. She had an x-ray earlier today which was reported as pneumonia. Repeat x-ray today shows bilateral airspace disease which could be pneumonia, but is felt to be more consistent with edema. Patient is exhibiting mild hypoxia, tachypnea. She will require hospitalization for further management. Patient administered Lasix for edema seen on x-ray. As pneumonia is a possibility, consideration for treatment of pneumonia is left to the hospitalist after they evaluate the patient.    Gilda Crease, MD 11/09/13 [phone]

## 2013-11-09 NOTE — H&P (Signed)
[facility] History and Physical  CHANIQUA BRISBY ZOX:[id number] DOB: [birth year] DOA: 11/09/2013  Referring physician: EDP PCP: [name] [name], MD    Chief Complaint: Shortness of breath  HPI: [name] is a 62 y.o. female severe psoriasis, morbid obesity, OSA, A. fib, chronic systolic and diastolic CHF, C. kidney failure, diabetes, COPD/ asthma was discharged from [facility] 3 weeks ago after admission for symptomatic anemia. She also recently had a large calf hematoma secondary to anticoagulation resulting in tissue necrosis requiring skin grafting and has been off anticoagulation for about a month now. Currently in rehabilitation, was sent to the ER for evaluation today due to increased shortness of breath, associated with dry cough. She denies any chest pain She denies any fevers or chills, reports occasional wheezing. Reports compliance with medications including diuretics and salt restriction. In the ER today she was noted to have hypoxemia, a chest x-ray with bilateral pneumonia versus interstitial edema, BNP greater than 3000.    Review of Systems: The patient denies anorexia, fever, weight loss,, vision loss, decreased hearing, hoarseness, chest pain, syncope, dyspnea on exertion, peripheral edema, balance deficits, hemoptysis, abdominal pain, melena, hematochezia, severe indigestion/heartburn, hematuria, incontinence, genital sores, muscle weakness, suspicious skin lesions, transient blindness, difficulty walking, depression, unusual weight change, abnormal bleeding, enlarged lymph nodes, angioedema, and breast masses.    Past Medical History  Diagnosis Date  . Asthma   . Bronchitis   . Allergic rhinitis   . HTN (hypertension)   . Hyperlipidemia   . Obesity   . OSA (obstructive sleep apnea)     poor compliance with cpap  . CHF (congestive heart failure)   . Biventricular failure     compensated  . Psoriasis   . Stasis edema     of lower extremities  .  Depression   . Situational stress   . Anemia   . Gout   . Chronic anticoagulation   . Yeast infection involving the vagina and surrounding area   . Atrial fibrillation with RVR   . GERD (gastroesophageal reflux disease)   . Chronic bronchitis     "get it q yr" (08/10/2013)  . Pneumonia     "said I did on 08/03/2013 then they ruled it out" (08/10/2013)  . Shortness of breath     "all the time" (08/10/2013)  . On home oxygen therapy     "2L during the night and prn during the day" (08/10/2013)  . Type II diabetes mellitus   . History of blood transfusion     "few during my lifetime; last time was 4 days ago when I got 2 units" (08/10/2013)  . Stroke ?2007    denies residuals on 08/10/2013  . DJD (degenerative joint disease)   . Bleeding     "from my skin folds; just won't stop" (08/10/2013)  . Complication of anesthesia     "they gave me too much; I stayed out of it for awhile" (08/10/2013)  . Anxiety   . Kidney stones   . Kidney disease, chronic, stage III (GFR 30-59 ml/min)   . Chronic kidney disease, stage IV (severe)   . Morbid obesity    Past Surgical History  Procedure Laterality Date  . Appendectomy    . Cholecystectomy    . Total knee arthroplasty Left ~ 2006  . Total abdominal hysterectomy    . Back surgery    . Cystectomy Left     hand  . Tonsillectomy    . Joint replacement    .  Lumbar disc surgery    . Colonoscopy N/A 08/16/2013    Procedure: COLONOSCOPY;  Surgeon: Theda Belfast, MD;  Location: [facility] ENDOSCOPY;  Service: Endoscopy;  Laterality: N/A;  . Hematoma evacuation Right 09/10/2013    Procedure: EVACUATION OF RIGHT LEG HEMATOMA AND EXCISION DEBRIDIMENT RIGHT LEG ;  Surgeon: Robyne Askew, MD;  Location: [facility] ORS;  Service: General;  Laterality: Right;  . Debridement and closure wound Right 09/22/2013    Procedure: IRRIGATION AND DEBRIDEMENT SURGICAL PREP PLACEMENT OF ACELL AND VAC  ;  Surgeon: Glenna Fellows, MD;  Location: [facility] OR[facility];  Service: Plastics;  Laterality:  Right;  . Skin split graft Right 10/07/2013    Procedure: SPLIT THICKNESS SKIN GRAFT RIGHT THIGH TO RIGHT LEG, PLACEMENT OF A CEL AND WOUND VAC TO RIGHT THIGH  ;  Surgeon: Glenna Fellows, MD;  Location: [facility];  Service: Plastics;  Laterality: Right;   Social History:  reports that she quit smoking about 31 years ago. Her smoking use included Cigarettes. She has a 1.44 pack-year smoking history. She has never used smokeless tobacco. She reports that she does not drink alcohol or use illicit drugs. Currently at SNF  Allergies  Allergen Reactions  . Daptomycin Other (See Comments)    Elevated CPK  . Morphine And Related Nausea And Vomiting  . Cephalexin Rash    unknown  . Celecoxib     Unknown, can use furosemide without issue  . Codeine     unknown  . Fluoxetine Hcl     unknown  . Latex     REACTION: undefined  . Ofloxacin     unknown  . Penicillins     unknown  . Rofecoxib     unknown  . Sulfonamide Derivatives     unknown  . Tramadol Nausea And Vomiting    Family History  Problem Relation Age of Onset  . Heart attack Father   . Asthma Father   . Heart disease Paternal Uncle   . Rectal cancer Paternal Aunt   . Other Mother     mva    Prior to Admission medications   Medication Sig Start Date End Date Taking? Authorizing Provider  albuterol (PROVENTIL HFA;VENTOLIN HFA) 108 (90 BASE) MCG/ACT inhaler Inhale 2 puffs into the lungs every 6 (six) hours as needed for wheezing or shortness of breath.    Yes Historical Provider, MD  allopurinol (ZYLOPRIM) 100 MG tablet Take 100 mg by mouth daily. Patient takes at 0800am daily 09/16/11  Yes Historical Provider, MD  amiodarone (PACERONE) 200 MG tablet Take 200 mg by mouth daily. Patient takes at 0800am daily   Yes Historical Provider, MD  amLODipine (NORVASC) 10 MG tablet Take 10 mg by mouth daily. Patient takes at 0800am daily 08/19/13  Yes Leroy Sea, MD  aspirin 325 MG tablet Take 325 mg by mouth daily.   Yes Historical  Provider, MD  atorvastatin (LIPITOR) 20 MG tablet Take 20 mg by mouth at bedtime. Patient takes at 2100   Yes Historical Provider, MD  docusate sodium (COLACE) 100 MG capsule Take 100 mg by mouth 2 (two) times daily.   Yes Historical Provider, MD  ferrous sulfate 325 (65 FE) MG tablet Take 325 mg by mouth 3 (three) times daily with meals. Patient takes at 0800, 1200, and 1700 08/29/12  Yes Gwenyth Bender, MD  furosemide (LASIX) 40 MG tablet Take 40 mg by mouth 2 (two) times daily. Patient takes at 0800am daily 08/19/13  Yes  Leroy Sea, MD  hydrALAZINE (APRESOLINE) 50 MG tablet Take 50 mg by mouth 3 (three) times daily.   Yes Historical Provider, MD  insulin glargine (LANTUS) 100 UNIT/ML injection Inject 20 Units into the skin 2 (two) times daily.   Yes Historical Provider, MD  levofloxacin (LEVAQUIN) 250 MG tablet Take 1 tablet (250 mg total) by mouth daily. 10/19/13  Yes Drema Dallas, MD  metoprolol tartrate (LOPRESSOR) 12.5 mg TABS tablet Take 0.5 tablets (12.5 mg total) by mouth 2 (two) times daily. 10/19/13  Yes Drema Dallas, MD  omeprazole (PRILOSEC) 20 MG capsule Take 20 mg by mouth daily.   Yes Historical Provider, MD  ondansetron (ZOFRAN) 4 MG tablet Take 1 tablet (4 mg total) by mouth every 6 (six) hours as needed for nausea. 10/19/13  Yes Drema Dallas, MD  oxyCODONE-acetaminophen (PERCOCET/ROXICET) 5-325 MG per tablet Take 1-2 tablets by mouth every 4 (four) hours as needed for moderate pain or severe pain (1 tablet for pain rated 1 through 5.  2 tablets for pain rated 6 to 10.). 09/22/13  Yes Rhetta Mura, MD  pantoprazole (PROTONIX) 40 MG tablet Take 40 mg by mouth daily.   Yes Historical Provider, MD  potassium chloride (K-DUR) 10 MEQ tablet Take 10 mEq by mouth daily.   Yes Historical Provider, MD  traZODone (DESYREL) 50 MG tablet Take 25 mg by mouth at bedtime.   Yes Historical Provider, MD   Physical Exam: Filed Vitals:   11/09/13 1724  BP: 115/31  Pulse: 71  Temp:  98.7 F (37.1 C)  Resp: 20     General: Morbidly obese resting in the stretcher in no distress  HEENT: Elevated JVD, oral mucosa moist and pink  CVS S1-S2 regular rate rhythm no murmurs rubs or gallops  Lungs distant breath sounds bilaterally  Abdomen soft obese nontender nondistended, no organomegaly  Extremities: Diffuse dry scaly psoriatic lesions on both lower legs, right leg with healing incision with overlying dressing  Psychiatric appropriate mood and affect   Neuro: Generalized deconditioning, no focal signs    Labs on Admission:  Basic Metabolic Panel:  Recent Labs Lab 11/09/13 1605  NA 140  K 4.2  CL 104  CO2 26  GLUCOSE 132*  BUN 31*  CREATININE 2.01*  CALCIUM 8.7   Liver Function Tests:  Recent Labs Lab 11/09/13 1605  AST 9  ALT 8  ALKPHOS 115  BILITOT 0.3  PROT 7.4  ALBUMIN 2.6*   No results found for this basename: LIPASE, AMYLASE,  in the last 168 hours No results found for this basename: AMMONIA,  in the last 168 hours CBC:  Recent Labs Lab 11/09/13 1605  WBC 10.0  NEUTROABS 8.8*  HGB 8.0*  HCT 26.7*  MCV 87.5  PLT 225   Cardiac Enzymes:  Recent Labs Lab 11/09/13 1605  TROPONINI <0.30    BNP (last 3 results)  Recent Labs  10/13/13 1503 10/13/13 2041 11/09/13 1605  PROBNP 2427.0* 2342.0* 3130.0*   CBG: No results found for this basename: GLUCAP,  in the last 168 hours  Radiological Exams on Admission: Dg Chest Port 1 View  11/09/2013   CLINICAL DATA:  Shortness of Breath  EXAM: PORTABLE CHEST - 1 VIEW  COMPARISON:  10/14/2013  FINDINGS: Cardiomegaly again noted. Bilateral hazy airspace disease which may be due to interstitial edema or bilateral pneumonia.  IMPRESSION: Bilateral airspace disease which may be due to bilateral pneumonia or bilateral pulmonary edema. Cardiomegaly again noted.   Electronically  Signed   By: Natasha Mead M.D.   On: 11/09/2013 15:28    EKG: Independently reviewed. Nonspecific T wave  changes  Assessment/Plan 1. acute hypoxic respiratory failure -Secondary to CHF primarily, unable to rule out pneumonia -With diuresis with IV Lasix 40 mg every 8, Foley for now -Follow weights, I/Os -Last echo 11/14 with EF of 50-55% and grade 1 diastolic dysfunction -Empiric antibiotics, vancomycin and aztreonam -Follow up blood cultures De-escalate antibiotics quickly, pending cultures  2. Diabetes Continue Lantus, sliding scale insulin  3. OSA - noncompliant with CPAP  4. A. fib  -Rate controlled , continue amiodarone  -off anticoagulation since late December   5. anemia chronic disease  -Stable   6. CKD 4 -Stable, monitor with diuresis  DVT prophylaxis: lovenox  Code Status: Full code Family Communication: None at bedside Disposition Plan: Inpatient  Time spent:  [facility] Pager [phone]  If 7PM-7AM, please contact night-coverage [email or web address] Password [facility] 11/09/2013, 8:02 PM

## 2013-11-09 NOTE — ED Notes (Signed)
Pt used bedpan but did not void enough to send a urine specimen

## 2013-11-09 NOTE — Progress Notes (Addendum)
IV team stuck patient 3 times and unable to get IV access. Was stuck by ER nurse at least one time. No one available for PICC insertion tonight. Notified [facility] night floor coverage to see if IV meds (lasix and antibiotics) can be switched to PO's for the night. Await further orders/instructions. Bland Span C Per orders, will switch IV lasix to PO tonight and will hold IV antibiotics until IV access is addressed in the am. Ginny Forth

## 2013-11-09 NOTE — Progress Notes (Signed)
ANTIBIOTIC CONSULT NOTE - INITIAL  Pharmacy Consult for Vancomycin & Aztreonam Indication: Suspected Pneumonia  Allergies  Allergen Reactions  . Daptomycin Other (See Comments)    Elevated CPK  . Morphine And Related Nausea And Vomiting  . Cephalexin Rash    unknown  . Celecoxib     Unknown, can use furosemide without issue  . Codeine     unknown  . Fluoxetine Hcl     unknown  . Latex     REACTION: undefined  . Ofloxacin     unknown  . Penicillins     unknown  . Rofecoxib     unknown  . Sulfonamide Derivatives     unknown  . Tramadol Nausea And Vomiting    Patient Measurements: Height: 5\' 3"  (160 cm) Weight: 271 lb 2.7 oz (123 kg) IBW/kg (Calculated) : 52.4   Vital Signs: Temp: 98.7 F (37.1 C) (12/02 1724) Temp src: Oral (12/02 1724) BP: 115/31 mmHg (12/02 1724) Pulse Rate: 71 (12/02 1724) Intake/Output from previous day:   Intake/Output from this shift:    Labs:  Recent Labs  11/09/13 1605  WBC 10.0  HGB 8.0*  PLT 225  CREATININE 2.01*   Estimated Creatinine Clearance: 36.5 ml/min (by C-G formula based on Cr of 2.01). No results found for this basename: VANCOTROUGH, Leodis Binet, VANCORANDOM, GENTTROUGH, GENTPEAK, GENTRANDOM, TOBRATROUGH, TOBRAPEAK, TOBRARND, AMIKACINPEAK, AMIKACINTROU, AMIKACIN,  in the last 72 hours   Microbiology: Recent Results (from the past 720 hour(s))  CULTURE, BLOOD (ROUTINE X 2)     Status: None   Collection Time    10/13/13  8:15 PM      Result Value Range Status   Specimen Description BLOOD LEFT HAND   Final   Special Requests BOTTLES DRAWN AEROBIC AND ANAEROBIC 10CC   Final   Culture  Setup Time     Final   Value: 10/14/2013 01:07     Performed at [facility]   Culture     Final   Value: NO GROWTH 5 DAYS     Performed at [facility]   Report Status 10/20/2013 FINAL   Final  CULTURE, BLOOD (ROUTINE X 2)     Status: None   Collection Time    10/13/13  8:35 PM      Result Value Range Status   Specimen Description BLOOD LEFT ARM   Final   Special Requests BOTTLES DRAWN AEROBIC AND ANAEROBIC 10CC   Final   Culture  Setup Time     Final   Value: 10/14/2013 01:07     Performed at [facility]   Culture     Final   Value: NO GROWTH 5 DAYS     Performed at [facility]   Report Status 10/20/2013 FINAL   Final  MRSA PCR SCREENING     Status: None   Collection Time    10/18/13  8:53 PM      Result Value Range Status   MRSA by PCR NEGATIVE  NEGATIVE Final   Comment:            The GeneXpert MRSA Assay (FDA     approved for NASAL specimens     only), is one component of a     comprehensive MRSA colonization     surveillance program. It is not     intended to diagnose MRSA     infection nor to guide or     monitor treatment for     MRSA  infections.    Medical History: Past Medical History  Diagnosis Date  . Asthma   . Bronchitis   . Allergic rhinitis   . HTN (hypertension)   . Hyperlipidemia   . Obesity   . OSA (obstructive sleep apnea)     poor compliance with cpap  . CHF (congestive heart failure)   . Biventricular failure     compensated  . Psoriasis   . Stasis edema     of lower extremities  . Depression   . Situational stress   . Anemia   . Gout   . Chronic anticoagulation   . Yeast infection involving the vagina and surrounding area   . Atrial fibrillation with RVR   . GERD (gastroesophageal reflux disease)   . Chronic bronchitis     "get it q yr" (08/10/2013)  . Pneumonia     "said I did on 08/03/2013 then they ruled it out" (08/10/2013)  . Shortness of breath     "all the time" (08/10/2013)  . On home oxygen therapy     "2L during the night and prn during the day" (08/10/2013)  . Type II diabetes mellitus   . History of blood transfusion     "few during my lifetime; last time was 4 days ago when I got 2 units" (08/10/2013)  . Stroke ?2007    denies residuals on 08/10/2013  . DJD (degenerative joint disease)   . Bleeding     "from my skin  folds; just won't stop" (08/10/2013)  . Complication of anesthesia     "they gave me too much; I stayed out of it for awhile" (08/10/2013)  . Anxiety   . Kidney stones   . Kidney disease, chronic, stage III (GFR 30-59 ml/min)   . Chronic kidney disease, stage IV (severe)   . Morbid obesity     Medications:  Scheduled:  . furosemide  40 mg Intravenous Once   Infusions:  . aztreonam    . vancomycin     Assessment:  62 yr old obese female from SNF presents with complaint of difficulty breathing x 3 days. Pt with h/o CHF and chronic lung disease.  Chest Xray shows bilateral pneumonia vs interstitial edema  Vancomycin and Cefepime per pharmacy ordered originally ordered for suspected PNA.  Patient with noted allergy to cephalexin (rxn: rash).  During previous hospitalizations pt has tolerated aztreonam and levofloxacin.  Contacted Dr Jomarie Longs and received verbal order to d/c cefepime and begin Aztreonam per pharmacy  Scr = 2.01 with CrCl (n) ~ 32 ml/min  Tmax = 99.1 F, WBC = 10   Blood cultures x 2 ordered  Goal of Therapy:  Vancomycin trough level 15-20 mcg/ml  Plan:  Measure antibiotic drug levels at steady state Follow up culture results Vancomycin 2000mg  IV x 1 dose followed by 1750mg  IV q24h Aztreonam 1gm IV q8h  Maryellen Pile, PharmD 11/09/2013,8:03 PM

## 2013-11-10 ENCOUNTER — Inpatient Hospital Stay (HOSPITAL_COMMUNITY): Payer: Medicare HMO

## 2013-11-10 DIAGNOSIS — J9600 Acute respiratory failure, unspecified whether with hypoxia or hypercapnia: Secondary | ICD-10-CM

## 2013-11-10 DIAGNOSIS — I509 Heart failure, unspecified: Secondary | ICD-10-CM

## 2013-11-10 LAB — BASIC METABOLIC PANEL
BUN: 31 mg/dL — ABNORMAL HIGH (ref 6–23)
CO2: 26 mEq/L (ref 19–32)
Chloride: 104 mEq/L (ref 96–112)
Creatinine, Ser: 2.15 mg/dL — ABNORMAL HIGH (ref 0.50–1.10)
GFR calc Af Amer: 27 mL/min — ABNORMAL LOW (ref >90)
GFR calc non Af Amer: 23 mL/min — ABNORMAL LOW (ref >90)
Glucose, Bld: 102 mg/dL — ABNORMAL HIGH (ref 70–99)
Potassium: 4.9 mEq/L (ref 3.5–5.1)
Sodium: 138 mEq/L (ref 135–145)

## 2013-11-10 LAB — GLUCOSE, CAPILLARY
Glucose-Capillary: 95 mg/dL (ref 70–99)
Glucose-Capillary: 104 mg/dL — ABNORMAL HIGH (ref 70–99)

## 2013-11-10 LAB — CBC
HCT: 24.3 % — ABNORMAL LOW (ref 36.0–46.0)
Hemoglobin: 7.3 g/dL — ABNORMAL LOW (ref 12.0–15.0)
MCH: 26.6 pg (ref 26.0–34.0)
MCHC: 30 g/dL (ref 30.0–36.0)
MCV: 88.7 fL (ref 78.0–100.0)
RBC: 2.74 MIL/uL — ABNORMAL LOW (ref 3.87–5.11)
WBC: 8.7 10*3/uL (ref 4.0–10.5)

## 2013-11-10 LAB — TYPE AND SCREEN
Antibody Screen: POSITIVE
PT AG Type: NEGATIVE

## 2013-11-10 MED ORDER — SODIUM CHLORIDE 0.9 % IJ SOLN: 10.0000 mL | INTRAMUSCULAR | Status: DC | PRN

## 2013-11-10 MED ORDER — SODIUM CHLORIDE 0.9 % IJ SOLN
10.0000 mL | Freq: Two times a day (BID) | INTRAMUSCULAR | Status: DC
  Administered 2013-11-13 – 2013-11-14 (×2): 10 mL

## 2013-11-10 MED ORDER — LEVOFLOXACIN 750 MG PO TABS
750.0000 mg | ORAL_TABLET | Freq: Every day | ORAL | Status: DC
Start: 2013-11-10 — End: 2013-11-10
  Administered 2013-11-10: 750 mg via ORAL
  Filled 2013-11-10: qty 1

## 2013-11-10 MED ORDER — FUROSEMIDE 10 MG/ML IJ SOLN
40.0000 mg | Freq: Three times a day (TID) | INTRAMUSCULAR | Status: DC
  Administered 2013-11-10 – 2013-11-11 (×4): 40 mg via INTRAVENOUS
  Filled 2013-11-10 (×6): qty 4

## 2013-11-10 MED ORDER — DIPHENHYDRAMINE HCL 25 MG PO CAPS
ORAL_CAPSULE | ORAL | Status: AC
  Filled 2013-11-10: qty 1

## 2013-11-10 MED ORDER — LEVOFLOXACIN 750 MG PO TABS
750.0000 mg | ORAL_TABLET | ORAL | Status: DC
  Administered 2013-11-12 – 2013-11-16 (×3): 750 mg via ORAL
  Filled 2013-11-10 (×3): qty 1

## 2013-11-10 NOTE — Procedures (Signed)
Central Venous Catheter Insertion Procedure Note [name] [id number] December 30, 1950  Procedure: Insertion of Central Venous Catheter Indications: Drug and/or fluid administration  Procedure Details Consent: Risks of procedure as well as the alternatives and risks of each were explained to the patient.  Consent for procedure obtained. Time Out: Verified patient identification, verified procedure, site/side was marked, verified correct patient position, special equipment/implants available, medications/allergies/relevent history reviewed, required imaging and test results available.  Performed  Maximum sterile technique was used including antiseptics, cap, gloves, gown, hand hygiene, mask and sheet. Skin prep: Chlorhexidine; local anesthetic administered A antimicrobial bonded/coated triple lumen catheter was placed in the right internal jugular vein using the Seldinger technique and ultrasound guidance.  Evaluation Blood flow good Complications: No apparent complications Patient did tolerate procedure well. Chest X-ray ordered to verify placement.  CXR: pending.  Performed by Canary Brim, NP.  I was present for entire procedure.  Coralyn Helling, MD [facility] Pulmonary/Critical Care 11/10/2013, 11:57 AM Pager:  [phone] After 3pm call: [phone]

## 2013-11-10 NOTE — Progress Notes (Signed)
[facility] PROGRESS NOTE   SHANNEN FLANSBURG UEA:[id number] DOB: [birth year] DOA: 11/09/2013 PCP: Willey Blade, MD  Brief narrative: [name] is an 62 y.o. female with 6 hospitalizations in the past 6 months and a past medical history of atrial fibrillation, morbid obesity, obstructive sleep apnea, chronic systolic and diastolic heart failure, chronic kidney disease, diabetes, COPD/asthma with most recent hospitalization from 10/13/2013-10/21/2013 for treatment of symptomatic anemia and decompensated heart failure, who was admitted 11/09/2013 with shortness of breath. Chest x-ray done on admission showed possible bilateral pneumonia versus interstitial anemia and her admission pro BNP was greater than 3000.  Assessment/Plan: Principal Problem:   Acute respiratory failure Appears to be multifactorial with chronic systolic/diastolic heart failure, obstructive sleep apnea/obesity hypoventilation syndrome, and possible healthcare associated pneumonia all potentially contributory. She is known to be noncompliant with CPAP. Continue albuterol. Active Problems:   Acute combined systolic and diastolic heart failure Elevated proBNP and chest radiograph consistent with pulmonary edema. Lasix has been increased to 40 mg 3 times a day (home dose 40 mg twice a day). Monitor intake and output closely as well as daily weights and adjust therapy as needed. Last echocardiogram done 11/14 with an EF of 50-55% and grade 1 diastolic dysfunction.   History of C V A / STROKE Continue risk factor modification with control of blood pressure, lipids, and diabetes.   DM (diabetes mellitus) Last hemoglobin A1c 5.4% on 10/13/2013. Currently on Lantus 20 units twice a day and moderate scale SSI. CBGs 95-149.   CKD (chronic kidney disease), stage IV Baseline creatinine 2-2.1. Current creatinine at usual baseline values.   Anemia of chronic disease Baseline hemoglobin around 8 mg/dL. Current hemoglobin 7.3 mg/dL.  Transfuse for hemoglobin less than 7.   PAF (paroxysmal atrial fibrillation) Rate controlled on amiodarone. Not currently a candidate for anticoagulation given history of hematoma.  Currently in NSR with HR in 60's (converted from atrial fibrillation at 11:12 today).   Code Status: Full. Family Communication: No family at bedside. Disposition Plan: SNF.   IV access:  Central line placed in right IJ by [facility] 11/10/13  Medical Consultants:  Pulmonology  Other Consultants:  None.  Anti-infectives:  Vancomycin 11/09/2013---> 11/10/2013  Aztreonam 11/09/2013---> 11/10/2013  Levaquin 11/10/2013--->  HPI/Subjective: Genavie L Wessner complains of ongoing dyspnea, occasional cough, mostly non-productive.  No chest pain.  Tells me she is unable to tolerated CPAP because it makes her feel like she is "smothering".  Objective: Filed Vitals:   11/09/13 2015 11/09/13 2032 11/10/13 0549 11/10/13 1100  BP:  174/51 168/70 147/62  Pulse:  75 119 110  Temp:  98.9 F (37.2 C) 100.1 F (37.8 C)   TempSrc:  Oral Oral   Resp:      Height:  5\' 3"  (1.6 m)    Weight:  125.1 kg (275 lb 12.7 oz) 125 kg (275 lb 9.2 oz)   SpO2: 87% 94% 91%     Intake/Output Summary (Last 24 hours) at 11/10/13 1126 Last data filed at 11/10/13 0551  Gross per 24 hour  Intake    720 ml  Output    800 ml  Net    -80 ml    Exam: Gen:  NAD Cardiovascular:  RRR, No M/R/G Respiratory:  Lungs diminished Gastrointestinal:  Abdomen soft, NT/ND, + BS Extremities:  3+ edema  Data Reviewed: asic Metabolic Panel:  Recent Labs Lab 11/09/13 1605 11/10/13 0424  NA 140 138  K 4.2 4.9  CL 104 104  CO2 26 26  GLUCOSE 132* 102*  BUN 31* 31*  CREATININE 2.01* 2.15*  CALCIUM 8.7 8.3*   GFR Estimated Creatinine Clearance: 34.4 ml/min (by C-G formula based on Cr of 2.15). Liver Function Tests:  Recent Labs Lab 11/09/13 1605  AST 9  ALT 8  ALKPHOS 115  BILITOT 0.3  PROT 7.4  ALBUMIN 2.6*     CBC:  Recent Labs Lab 11/09/13 1605 11/10/13 0424  WBC 10.0 8.7  NEUTROABS 8.8*  --   HGB 8.0* 7.3*  HCT 26.7* 24.3*  MCV 87.5 88.7  PLT 225 196   Cardiac Enzymes:  Recent Labs Lab 11/09/13 1605  TROPONINI <0.30   BNP (last 3 results)  Recent Labs  10/13/13 1503 10/13/13 2041 11/09/13 1605  PROBNP 2427.0* 2342.0* 3130.0*   CBG:  Recent Labs Lab 11/09/13 2232 11/10/13 0735  GLUCAP 149* 95   Microbiology Recent Results (from the past 240 hour(s))  CULTURE, BLOOD (ROUTINE X 2)     Status: None   Collection Time    11/09/13  4:05 PM      Result Value Range Status   Specimen Description BLOOD LEFT ARM   Final   Special Requests BOTTLES DRAWN AEROBIC AND ANAEROBIC   Final   Culture  Setup Time     Final   Value: 11/09/2013 20:31     Performed at [facility]   Culture     Final   Value:        BLOOD CULTURE RECEIVED NO GROWTH TO DATE CULTURE WILL BE HELD FOR 5 DAYS BEFORE ISSUING A FINAL NEGATIVE REPORT     Performed at [facility]   Report Status PENDING   Incomplete  CULTURE, BLOOD (ROUTINE X 2)     Status: None   Collection Time    11/09/13  4:10 PM      Result Value Range Status   Specimen Description BLOOD LEFT ANTECUBITAL   Final   Special Requests BOTTLES DRAWN AEROBIC AND ANAEROBIC   Final   Culture  Setup Time     Final   Value: 11/09/2013 20:30     Performed at [facility]   Culture     Final   Value:        BLOOD CULTURE RECEIVED NO GROWTH TO DATE CULTURE WILL BE HELD FOR 5 DAYS BEFORE ISSUING A FINAL NEGATIVE REPORT     Performed at [facility]   Report Status PENDING   Incomplete     Procedures and Diagnostic Studies: Dg Chest Port 1 View  11/09/2013   CLINICAL DATA:  Shortness of Breath  EXAM: PORTABLE CHEST - 1 VIEW  COMPARISON:  10/14/2013  FINDINGS: Cardiomegaly again noted. Bilateral hazy airspace disease which may be due to interstitial edema or bilateral pneumonia.  IMPRESSION:  Bilateral airspace disease which may be due to bilateral pneumonia or bilateral pulmonary edema. Cardiomegaly again noted.   Electronically Signed   By: Natasha Mead M.D.   On: 11/09/2013 15:28   Dg Chest Port 1 View  10/14/2013   CLINICAL DATA:  Left-sided central line placement  EXAM: PORTABLE CHEST - 1 VIEW  COMPARISON:  10/13/2013  FINDINGS: Left-sided subclavian central line has its tip in the SVC 3 cm above the right atrium. No pneumothorax. Cardiomegaly persists with edema and or pneumonia bilaterally.  IMPRESSION: Left subclavian central line well positioned with its tip in the SVC 3 cm above the right atrium. No pneumothorax.  Cardiomegaly and pulmonary edema and  or bilateral pneumonia.   Electronically Signed   By: Paulina Fusi M.D.   On: 10/14/2013 13:27   Dg Chest Port 1 View  10/13/2013   CLINICAL DATA:  Shortness of breath  EXAM: PORTABLE CHEST - 1 VIEW  COMPARISON:  10/07/2013  FINDINGS: Cardiac shadow remains enlarged. Increased vascular congestion and mild pulmonary edema is noted. No focal confluent infiltrate is seen. No bony abnormality is noted.  IMPRESSION: Changes consistent with CHF.   Electronically Signed   By: Alcide Clever M.D.   On: 10/13/2013 16:43    Scheduled Meds: . allopurinol  100 mg Oral Daily  . amiodarone  200 mg Oral Daily  . amLODipine  10 mg Oral Daily  . aspirin  325 mg Oral Daily  . atorvastatin  20 mg Oral QHS  . docusate sodium  100 mg Oral BID  . enoxaparin (LOVENOX) injection  40 mg Subcutaneous Q24H  . ferrous sulfate  325 mg Oral TID WC  . furosemide  40 mg Oral Q8H  . hydrALAZINE  50 mg Oral TID  . insulin aspart  0-15 Units Subcutaneous TID WC  . insulin glargine  20 Units Subcutaneous BID  . levofloxacin  750 mg Oral Daily  . metoprolol tartrate  12.5 mg Oral BID  . pantoprazole  40 mg Oral Daily  . potassium chloride  20 mEq Oral Daily  . traZODone  25 mg Oral QHS   Continuous Infusions:   Time spent: 35 minutes with > 50% of time  discussing current diagnostic test results, clinical impression and plan of care.    LOS: 1 day   Zeki Bedrosian  [facility] Pager [phone].   *Please note that the hospitalists switch teams on Wednesdays. Please call the flow manager at [phone] if you are having difficulty reaching the hospitalist taking care of this patient as she can update you and provide the most up-to-date pager number of provider caring for the patient. If 8PM-8AM, please contact night-coverage at [email or web address], password [facility]  11/10/2013, 11:26 AM

## 2013-11-10 NOTE — Progress Notes (Signed)
Clinical Social Work Department BRIEF PSYCHOSOCIAL ASSESSMENT 11/10/2013  Patient:  [name], [name]     Account Number:  [id number]     Admit date:  11/09/2013  Clinical Social Worker:  Orpah Greek  Date/Time:  11/10/2013 04:08 PM  Referred by:  Physician  Date Referred:  11/10/2013 Referred for  Other - See comment   Other Referral:   Admitted from: [facility] (short-term rehab)   Interview type:  Patient Other interview type:    PSYCHOSOCIAL DATA Living Status:  FACILITY Admitted from facility:  [facility] Level of care:  Skilled Nursing Facility Primary support name:  [name] (husband) ph#: [phone] Primary support relationship to patient:  SPOUSE Degree of support available:   good    CURRENT CONCERNS Current Concerns  Post-Acute Placement   Other Concerns:    SOCIAL WORK ASSESSMENT / PLAN CSW received consult that patient was admitted from [facility].   Assessment/plan status:  Information/Referral to [facility] Other assessment/ plan:   Information/referral to community resources:   CSW completed FL2 and faxed information to [facility], confirmed with [facility] that they would be able to take patient back pending [employer] authorization.    PATIENT'S/FAMILY'S RESPONSE TO PLAN OF CARE: Patient states that she was hoping to be discharged home from [facility] on December 9th, but is afraid that her being readmitted to the hospital would extend her stay at [facility]. Patient states that she would really like to just go home from the hospital rather than going back to [facility] - awaiting PT evaluation for disposition recommendation.       Unice Bailey, LCSW [facility] Clinical Social Worker cell #: [phone]

## 2013-11-11 LAB — BASIC METABOLIC PANEL
CO2: 29 mEq/L (ref 19–32)
Calcium: 8.4 mg/dL (ref 8.4–10.5)
Chloride: 102 mEq/L (ref 96–112)
Creatinine, Ser: 2.44 mg/dL — ABNORMAL HIGH (ref 0.50–1.10)
GFR calc non Af Amer: 20 mL/min — ABNORMAL LOW (ref >90)
Glucose, Bld: 91 mg/dL (ref 70–99)
Potassium: 4.9 mEq/L (ref 3.5–5.1)
Sodium: 138 mEq/L (ref 135–145)

## 2013-11-11 LAB — CULTURE, BLOOD (ROUTINE X 2)

## 2013-11-11 LAB — CBC
Hemoglobin: 7.4 g/dL — ABNORMAL LOW (ref 12.0–15.0)
MCH: 26.1 pg (ref 26.0–34.0)
MCV: 89 fL (ref 78.0–100.0)
Platelets: 188 10*3/uL (ref 150–400)
RBC: 2.83 MIL/uL — ABNORMAL LOW (ref 3.87–5.11)
RDW: 17.3 % — ABNORMAL HIGH (ref 11.5–15.5)
WBC: 7.9 10*3/uL (ref 4.0–10.5)

## 2013-11-11 LAB — GLUCOSE, CAPILLARY
Glucose-Capillary: 78 mg/dL (ref 70–99)
Glucose-Capillary: 120 mg/dL — ABNORMAL HIGH (ref 70–99)

## 2013-11-11 MED ORDER — FUROSEMIDE 10 MG/ML IJ SOLN
40.0000 mg | Freq: Two times a day (BID) | INTRAMUSCULAR | Status: DC
  Administered 2013-11-12 – 2013-11-16 (×9): 40 mg via INTRAVENOUS
  Filled 2013-11-11 (×10): qty 4

## 2013-11-11 MED ORDER — HYDROCERIN EX CREA
TOPICAL_CREAM | Freq: Three times a day (TID) | CUTANEOUS | Status: DC
  Administered 2013-11-11: 16:00:00 via TOPICAL
  Administered 2013-11-11: 1 via TOPICAL
  Administered 2013-11-12 – 2013-11-16 (×14): via TOPICAL
  Filled 2013-11-11: qty 113

## 2013-11-11 MED ORDER — POLYETHYLENE GLYCOL 3350 17 G PO PACK
17.0000 g | PACK | Freq: Every day | ORAL | Status: DC
  Administered 2013-11-11 – 2013-11-15 (×5): 17 g via ORAL
  Filled 2013-11-11 (×5): qty 1

## 2013-11-11 MED ORDER — SODIUM CHLORIDE 0.9 % IJ SOLN
10.0000 mL | INTRAMUSCULAR | Status: DC | PRN
  Administered 2013-11-12 (×2): 10 mL
  Administered 2013-11-12: 04:00:00 20 mL
  Administered 2013-11-13: 06:00:00 10 mL
  Administered 2013-11-13: 20 mL
  Administered 2013-11-14 – 2013-11-15 (×4): 10 mL

## 2013-11-11 NOTE — Progress Notes (Signed)
RT called to bedside for low O2 sats and pt SOB.  Pt found on NRB with sats of 100%.  BBS are diminished with rales in bases.  Pt taken off NRB and placed on 50% VM and tolerating well at this time.  Pt states that she feels alittle SOB but no more than usual.  Vital signs are WNL at this time.  Pt notified to call if any complications should arise.  RT to monitor and assess as needed.

## 2013-11-11 NOTE — Progress Notes (Signed)
[facility] PROGRESS NOTE   [name] YQM:[id number] DOB: 1951/06/06 DOA: 11/09/2013 PCP: Willey Blade, MD  Brief narrative: [name] is an 62 y.o. female with 6 hospitalizations in the past 6 months and a past medical history of atrial fibrillation, morbid obesity, obstructive sleep apnea, chronic systolic and diastolic heart failure, chronic kidney disease, diabetes, COPD/asthma with most recent hospitalization from 10/13/2013-10/21/2013 for treatment of symptomatic anemia and decompensated heart failure, who was admitted 11/09/2013 with shortness of breath. Chest x-ray done on admission showed possible bilateral pneumonia versus interstitial anemia and her admission pro BNP was greater than 3000.  Assessment/Plan: Principal Problem:   Acute respiratory failure Appears to be multifactorial with chronic systolic/diastolic heart failure, obstructive sleep apnea/obesity hypoventilation syndrome, and possible healthcare associated pneumonia all potentially contributory. She is known to be noncompliant with CPAP. Continue albuterol. Active Problems:   Acute combined systolic and diastolic heart failure Elevated proBNP and chest radiograph consistent with pulmonary edema. Lasix has been increased to 40 mg 3 times a day (home dose 40 mg twice a day). I and O. balance -1.7 L, but weight up. Last echocardiogram done 11/14 with an EF of 50-55% and grade 1 diastolic dysfunction.   History of C V A / STROKE Continue risk factor modification with control of blood pressure, lipids, and diabetes.   DM (diabetes mellitus) Last hemoglobin A1c 5.4% on 10/13/2013. Currently on Lantus 20 units twice a day and moderate scale SSI. CBGs 95-149.   CKD (chronic kidney disease), stage IV Baseline creatinine 2-2.1. Current creatinine slightly higher, likely from diuretic effects.   Anemia of chronic disease Baseline hemoglobin around 8 mg/dL. Current hemoglobin 7.4 mg/dL. Transfuse for hemoglobin  less than 7.   PAF (paroxysmal atrial fibrillation) Rate controlled on amiodarone. Not currently a candidate for anticoagulation given history of hematoma.  Currently in NSR with HR in 60's (converted from atrial fibrillation at 11:12 10/11/2013).   Code Status: Full. Family Communication: Husband and cousin at bedside. Disposition Plan: SNF.   IV access:  Central line placed in right IJ by [facility] 11/10/13  Medical Consultants:  Pulmonology  Other Consultants:  None.  Anti-infectives:  Vancomycin 11/09/2013---> 11/10/2013  Aztreonam 11/09/2013---> 11/10/2013  Levaquin 11/10/2013--->  HPI/Subjective: [name] says her dyspnea is improving, still with occasional cough, mostly non-productive.  No chest pain.  No BM since Monday.  Objective: Filed Vitals:   11/10/13 1100 11/10/13 1445 11/10/13 2137 11/11/13 0515  BP: 147/62 164/56 170/46 151/52  Pulse: 110 66 64 60  Temp: 98.3 F (36.8 C) 98.7 F (37.1 C) 98.5 F (36.9 C) 98.5 F (36.9 C)  TempSrc: Oral Oral Oral Oral  Resp:  20 20 20   Height:      Weight:    125.7 kg (277 lb 1.9 oz)  SpO2: 94% 96% 91% 94%    Intake/Output Summary (Last 24 hours) at 11/11/13 0844 Last data filed at 11/11/13 0700  Gross per 24 hour  Intake    720 ml  Output   2425 ml  Net  -1705 ml    Exam: Gen:  NAD Cardiovascular:  RRR, No M/R/G Respiratory:  Lungs diminished Gastrointestinal:  Abdomen soft, NT/ND, + BS Extremities:  3+ edema  Data Reviewed: asic Metabolic Panel:  Recent Labs Lab 11/09/13 1605 11/10/13 0424 11/11/13 0420  NA 140 138 138  K 4.2 4.9 4.9  CL 104 104 102  CO2 26 26 29   GLUCOSE 132* 102* 91  BUN 31* 31* 42*  CREATININE  2.01* 2.15* 2.44*  CALCIUM 8.7 8.3* 8.4   GFR Estimated Creatinine Clearance: 30.4 ml/min (by C-G formula based on Cr of 2.44). Liver Function Tests:  Recent Labs Lab 11/09/13 1605  AST 9  ALT 8  ALKPHOS 115  BILITOT 0.3  PROT 7.4  ALBUMIN 2.6*    CBC:  Recent  Labs Lab 11/09/13 1605 11/10/13 0424 11/11/13 0420  WBC 10.0 8.7 7.9  NEUTROABS 8.8*  --   --   HGB 8.0* 7.3* 7.4*  HCT 26.7* 24.3* 25.2*  MCV 87.5 88.7 89.0  PLT 225 196 188   Cardiac Enzymes:  Recent Labs Lab 11/09/13 1605  TROPONINI <0.30   BNP (last 3 results)  Recent Labs  10/13/13 1503 10/13/13 2041 11/09/13 1605  PROBNP 2427.0* 2342.0* 3130.0*   CBG:  Recent Labs Lab 11/10/13 0735 11/10/13 1203 11/10/13 1657 11/10/13 2135 11/11/13 0746  GLUCAP 95 110* 104* 99 78   Microbiology Recent Results (from the past 240 hour(s))  CULTURE, BLOOD (ROUTINE X 2)     Status: None   Collection Time    11/09/13  4:05 PM      Result Value Range Status   Specimen Description BLOOD LEFT ARM   Final   Special Requests BOTTLES DRAWN AEROBIC AND ANAEROBIC   Final   Culture  Setup Time     Final   Value: 11/09/2013 20:31     Performed at [facility]   Culture     Final   Value:        BLOOD CULTURE RECEIVED NO GROWTH TO DATE CULTURE WILL BE HELD FOR 5 DAYS BEFORE ISSUING A FINAL NEGATIVE REPORT     Performed at [facility]   Report Status PENDING   Incomplete  CULTURE, BLOOD (ROUTINE X 2)     Status: None   Collection Time    11/09/13  4:10 PM      Result Value Range Status   Specimen Description BLOOD LEFT ANTECUBITAL   Final   Special Requests BOTTLES DRAWN AEROBIC AND ANAEROBIC   Final   Culture  Setup Time     Final   Value: 11/09/2013 20:30     Performed at [facility]   Culture     Final   Value: GRAM POSITIVE COCCI IN CLUSTERS     Note: Gram Stain Report Called to,Read Back By and Verified With: ALLIE S @ 1508 11/10/13 BY KRAWS     Performed at [facility]   Report Status PENDING   Incomplete  URINE CULTURE     Status: None   Collection Time    11/09/13  7:42 PM      Result Value Range Status   Specimen Description URINE, CLEAN CATCH   Final   Special Requests NONE   Final   Culture  Setup Time     Final    Value: 11/10/2013 02:46     Performed at [facility] Count     Final   Value: >=100,000 COLONIES/ML     Performed at [facility]   Culture     Final   Value: ESCHERICHIA COLI     Performed at [facility]   Report Status PENDING   Incomplete     Procedures and Diagnostic Studies: Dg Chest Port 1 View  11/09/2013   CLINICAL DATA:  Shortness of Breath  EXAM: PORTABLE CHEST - 1 VIEW  COMPARISON:  10/14/2013  FINDINGS:  Cardiomegaly again noted. Bilateral hazy airspace disease which may be due to interstitial edema or bilateral pneumonia.  IMPRESSION: Bilateral airspace disease which may be due to bilateral pneumonia or bilateral pulmonary edema. Cardiomegaly again noted.   Electronically Signed   By: Natasha Mead M.D.   On: 11/09/2013 15:28   Dg Chest Port 1 View  10/14/2013   CLINICAL DATA:  Left-sided central line placement  EXAM: PORTABLE CHEST - 1 VIEW  COMPARISON:  10/13/2013  FINDINGS: Left-sided subclavian central line has its tip in the SVC 3 cm above the right atrium. No pneumothorax. Cardiomegaly persists with edema and or pneumonia bilaterally.  IMPRESSION: Left subclavian central line well positioned with its tip in the SVC 3 cm above the right atrium. No pneumothorax.  Cardiomegaly and pulmonary edema and or bilateral pneumonia.   Electronically Signed   By: Paulina Fusi M.D.   On: 10/14/2013 13:27   Dg Chest Port 1 View  10/13/2013   CLINICAL DATA:  Shortness of breath  EXAM: PORTABLE CHEST - 1 VIEW  COMPARISON:  10/07/2013  FINDINGS: Cardiac shadow remains enlarged. Increased vascular congestion and mild pulmonary edema is noted. No focal confluent infiltrate is seen. No bony abnormality is noted.  IMPRESSION: Changes consistent with CHF.   Electronically Signed   By: Alcide Clever M.D.   On: 10/13/2013 16:43    Scheduled Meds: . allopurinol  100 mg Oral Daily  . amiodarone  200 mg Oral Daily  . amLODipine  10 mg Oral Daily  . aspirin  325 mg Oral  Daily  . atorvastatin  20 mg Oral QHS  . docusate sodium  100 mg Oral BID  . enoxaparin (LOVENOX) injection  40 mg Subcutaneous Q24H  . ferrous sulfate  325 mg Oral TID WC  . furosemide  40 mg Intravenous Q8H  . hydrALAZINE  50 mg Oral TID  . insulin aspart  0-15 Units Subcutaneous TID WC  . insulin glargine  20 Units Subcutaneous BID  . [START ON 11/12/2013] levofloxacin  750 mg Oral Q48H  . metoprolol tartrate  12.5 mg Oral BID  . pantoprazole  40 mg Oral Daily  . potassium chloride  20 mEq Oral Daily  . sodium chloride  10-40 mL Intracatheter Q12H  . traZODone  25 mg Oral QHS   Continuous Infusions:   Time spent: 25 minutes.    LOS: 2 days   Aleksander Edmiston  [facility] Pager [phone].   *Please note that the hospitalists switch teams on Wednesdays. Please call the flow manager at [phone] if you are having difficulty reaching the hospitalist taking care of this patient as she can update you and provide the most up-to-date pager number of provider caring for the patient. If 8PM-8AM, please contact night-coverage at [email or web address], password [facility]  11/11/2013, 8:44 AM

## 2013-11-12 DIAGNOSIS — K5900 Constipation, unspecified: Secondary | ICD-10-CM | POA: Diagnosis present

## 2013-11-12 LAB — URINE CULTURE

## 2013-11-12 LAB — GLUCOSE, CAPILLARY
Glucose-Capillary: 70 mg/dL (ref 70–99)
Glucose-Capillary: 88 mg/dL (ref 70–99)
Glucose-Capillary: 80 mg/dL (ref 70–99)
Glucose-Capillary: 93 mg/dL (ref 70–99)

## 2013-11-12 LAB — BASIC METABOLIC PANEL
BUN: 47 mg/dL — ABNORMAL HIGH (ref 6–23)
Calcium: 8.2 mg/dL — ABNORMAL LOW (ref 8.4–10.5)
Chloride: 100 mEq/L (ref 96–112)
GFR calc Af Amer: 22 mL/min — ABNORMAL LOW (ref >90)
GFR calc non Af Amer: 19 mL/min — ABNORMAL LOW (ref >90)
Potassium: 4.8 mEq/L (ref 3.5–5.1)
Sodium: 137 mEq/L (ref 135–145)

## 2013-11-12 MED ORDER — FLEET ENEMA 7-19 GM/118ML RE ENEM
1.0000 | ENEMA | Freq: Once | RECTAL | Status: AC
  Administered 2013-11-12: 17:00:00 1 via RECTAL
  Filled 2013-11-12: qty 1

## 2013-11-12 NOTE — Progress Notes (Signed)
Patient complains of shortness of breath with labored breathing, last night oxygen levels dropped to 85% on 6-liters of O2, RT called and placed patient on 50%VM This morning patient still complains of shortness of breath even on the 50%VM.  K.Kirby, NP notified about respiratory changes and requested for RT to reassess patient and RT gave breathing treatment and did not recommend any further treatment at this time.  Patient is still refusing CPAP at bedtime.  Will continue to monitor and report any further changes.  Ernesta Amble, RN

## 2013-11-12 NOTE — Progress Notes (Signed)
PT Cancellation Note  Patient Details Name: [name] MRN: [id number] DOB: Oct 09, 1951   Cancelled Treatment:    Reason Eval/Treat Not Completed: Patient declined, no reason specified Pt refused PT evaluation today.  She states she "just doesn't want to" despite encouragement.  Will check back as schedule permits.   Jorma Tassinari,KATHrine E 11/12/2013, 2:25 PM Zenovia Jarred, PT, DPT 11/12/2013 Pager: [phone]

## 2013-11-12 NOTE — Progress Notes (Addendum)
[facility] PROGRESS NOTE   [name] ZOX:[id number] DOB: 1951/09/20 DOA: 11/09/2013 PCP: Willey Blade, MD  Brief narrative: [name] is an 62 y.o. female with 6 hospitalizations in the past 6 months and a past medical history of atrial fibrillation, morbid obesity, obstructive sleep apnea, chronic systolic and diastolic heart failure, chronic kidney disease, diabetes, COPD/asthma with most recent hospitalization from 10/13/2013-10/21/2013 for treatment of symptomatic anemia and decompensated heart failure, who was admitted 11/09/2013 with shortness of breath. Chest x-ray done on admission showed possible bilateral pneumonia versus interstitial anemia and her admission pro BNP was greater than 3000.  Assessment/Plan: Principal Problem:   Acute respiratory failure Appears to be multifactorial with chronic systolic/diastolic heart failure, obstructive sleep apnea/obesity hypoventilation syndrome, and possible healthcare associated pneumonia all potentially contributory. She continues to refuse CPAP. Had hypoxic event overnight on 11/12/2013 necessitating higher flow oxygen. Continue albuterol. Active Problems:   Constipation Fleet enema ordered.   Acute combined systolic and diastolic heart failure Elevated proBNP and chest radiograph consistent with pulmonary edema. Lasix has been increased to 40 mg 3 times a day (home dose 40 mg twice a day). I and O. balance -2.1 L, but weight continues to trend up. Last echocardiogram done 11/14 with an EF of 50-55% and grade 1 diastolic dysfunction.   History of CVA / STROKE Continue risk factor modification with control of blood pressure, lipids, and diabetes.   DM (diabetes mellitus) Last hemoglobin A1c 5.4% on 10/13/2013. Currently on Lantus 20 units twice a day and moderate scale SSI. CBGs 95-149.   CKD (chronic kidney disease), stage IV Baseline creatinine 2-2.1. Current creatinine slightly higher, likely from diuretic effects.    Anemia of chronic disease Baseline hemoglobin around 8 mg/dL. Current hemoglobin 7.4 mg/dL. Transfuse for hemoglobin less than 7.   PAF (paroxysmal atrial fibrillation) Rate controlled on amiodarone. Not currently a candidate for anticoagulation given history of hematoma.  Currently in NSR with HR in 60's (converted from atrial fibrillation at 11:12 10/11/2013).  Code Status: Full. Family Communication: Husband and cousin at bedside. Disposition Plan: SNF.   IV access:  Central line placed in right IJ by [facility] 11/10/13  Medical Consultants:  Pulmonology  Other Consultants:  None.  Anti-infectives:  Vancomycin 11/09/2013---> 11/10/2013  Aztreonam 11/09/2013---> 11/10/2013  Levaquin 11/10/2013--->  HPI/Subjective: [name] says her dyspnea is improving, but is crying out because she feels constipated and is having rectal pain.  Objective: Filed Vitals:   11/11/13 2158 11/12/13 0502 11/12/13 0533 11/12/13 0737  BP: 171/49 160/50  173/49  Pulse: 74 66  73  Temp: 98.7 F (37.1 C) 99 F (37.2 C)  99.1 F (37.3 C)  TempSrc: Oral Oral  Oral  Resp: 24 24  22   Height:      Weight:  127.9 kg (281 lb 15.5 oz)    SpO2: 86% 93% 96% 93%    Intake/Output Summary (Last 24 hours) at 11/12/13 0806 Last data filed at 11/12/13 0504  Gross per 24 hour  Intake    200 ml  Output   2300 ml  Net  -2100 ml    Exam: Gen:  NAD Cardiovascular:  RRR, No M/R/G Respiratory:  Lungs diminished but clear Gastrointestinal:  Abdomen soft, NT/ND, + BS Extremities:  3+ edema  Data Reviewed: asic Metabolic Panel:  Recent Labs Lab 11/09/13 1605 11/10/13 0424 11/11/13 0420 11/12/13 0445  NA 140 138 138 137  K 4.2 4.9 4.9 4.8  CL 104 104 102 100  CO2  26 26 29 30   GLUCOSE 132* 102* 91 80  BUN 31* 31* 42* 47*  CREATININE 2.01* 2.15* 2.44* 2.56*  CALCIUM 8.7 8.3* 8.4 8.2*   GFR Estimated Creatinine Clearance: 29.3 ml/min (by C-G formula based on Cr of 2.56). Liver Function  Tests:  Recent Labs Lab 11/09/13 1605  AST 9  ALT 8  ALKPHOS 115  BILITOT 0.3  PROT 7.4  ALBUMIN 2.6*    CBC:  Recent Labs Lab 11/09/13 1605 11/10/13 0424 11/11/13 0420  WBC 10.0 8.7 7.9  NEUTROABS 8.8*  --   --   HGB 8.0* 7.3* 7.4*  HCT 26.7* 24.3* 25.2*  MCV 87.5 88.7 89.0  PLT 225 196 188   Cardiac Enzymes:  Recent Labs Lab 11/09/13 1605  TROPONINI <0.30   BNP (last 3 results)  Recent Labs  10/13/13 1503 10/13/13 2041 11/09/13 1605  PROBNP 2427.0* 2342.0* 3130.0*   CBG:  Recent Labs Lab 11/10/13 2135 11/11/13 0746 11/11/13 1146 11/11/13 1626 11/11/13 2155  GLUCAP 99 78 88 84 120*   Microbiology Recent Results (from the past 240 hour(s))  CULTURE, BLOOD (ROUTINE X 2)     Status: None   Collection Time    11/09/13  4:05 PM      Result Value Range Status   Specimen Description BLOOD LEFT ARM   Final   Special Requests BOTTLES DRAWN AEROBIC AND ANAEROBIC   Final   Culture  Setup Time     Final   Value: 11/09/2013 20:31     Performed at [facility]   Culture     Final   Value:        BLOOD CULTURE RECEIVED NO GROWTH TO DATE CULTURE WILL BE HELD FOR 5 DAYS BEFORE ISSUING A FINAL NEGATIVE REPORT     Performed at [facility]   Report Status PENDING   Incomplete  CULTURE, BLOOD (ROUTINE X 2)     Status: None   Collection Time    11/09/13  4:10 PM      Result Value Range Status   Specimen Description BLOOD LEFT ANTECUBITAL   Final   Special Requests BOTTLES DRAWN AEROBIC AND ANAEROBIC   Final   Culture  Setup Time     Final   Value: 11/09/2013 20:30     Performed at [facility]   Culture     Final   Value: STAPHYLOCOCCUS SPECIES (COAGULASE NEGATIVE)     Note: THE SIGNIFICANCE OF ISOLATING THIS ORGANISM FROM A SINGLE SET OF BLOOD CULTURES WHEN MULTIPLE SETS ARE DRAWN IS UNCERTAIN. PLEASE NOTIFY THE MICROBIOLOGY DEPARTMENT WITHIN ONE WEEK IF SPECIATION AND SENSITIVITIES ARE REQUIRED.     Note: Gram Stain Report  Called to,Read Back By and Verified With: ALLIE S @ 1508 11/10/13 BY KRAWS     Performed at [facility]   Report Status 11/11/2013 FINAL   Final  URINE CULTURE     Status: None   Collection Time    11/09/13  7:42 PM      Result Value Range Status   Specimen Description URINE, CLEAN CATCH   Final   Special Requests NONE   Final   Culture  Setup Time     Final   Value: 11/10/2013 02:46     Performed at [facility] Count     Final   Value: >=100,000 COLONIES/ML     Performed at [facility]  Final   Value: ESCHERICHIA COLI     Performed at [facility]   Report Status 11/12/2013 FINAL   Final   Organism ID, Bacteria ESCHERICHIA COLI   Final     Procedures and Diagnostic Studies: Dg Chest Port 1 View  11/09/2013   CLINICAL DATA:  Shortness of Breath  EXAM: PORTABLE CHEST - 1 VIEW  COMPARISON:  10/14/2013  FINDINGS: Cardiomegaly again noted. Bilateral hazy airspace disease which may be due to interstitial edema or bilateral pneumonia.  IMPRESSION: Bilateral airspace disease which may be due to bilateral pneumonia or bilateral pulmonary edema. Cardiomegaly again noted.   Electronically Signed   By: Natasha Mead M.D.   On: 11/09/2013 15:28   Dg Chest Port 1 View  10/14/2013   CLINICAL DATA:  Left-sided central line placement  EXAM: PORTABLE CHEST - 1 VIEW  COMPARISON:  10/13/2013  FINDINGS: Left-sided subclavian central line has its tip in the SVC 3 cm above the right atrium. No pneumothorax. Cardiomegaly persists with edema and or pneumonia bilaterally.  IMPRESSION: Left subclavian central line well positioned with its tip in the SVC 3 cm above the right atrium. No pneumothorax.  Cardiomegaly and pulmonary edema and or bilateral pneumonia.   Electronically Signed   By: Paulina Fusi M.D.   On: 10/14/2013 13:27   Dg Chest Port 1 View  10/13/2013   CLINICAL DATA:  Shortness of breath  EXAM: PORTABLE CHEST - 1 VIEW  COMPARISON:  10/07/2013   FINDINGS: Cardiac shadow remains enlarged. Increased vascular congestion and mild pulmonary edema is noted. No focal confluent infiltrate is seen. No bony abnormality is noted.  IMPRESSION: Changes consistent with CHF.   Electronically Signed   By: Alcide Clever M.D.   On: 10/13/2013 16:43    Scheduled Meds: . allopurinol  100 mg Oral Daily  . amiodarone  200 mg Oral Daily  . amLODipine  10 mg Oral Daily  . aspirin  325 mg Oral Daily  . atorvastatin  20 mg Oral QHS  . docusate sodium  100 mg Oral BID  . enoxaparin (LOVENOX) injection  40 mg Subcutaneous Q24H  . ferrous sulfate  325 mg Oral TID WC  . furosemide  40 mg Intravenous Q12H  . hydrALAZINE  50 mg Oral TID  . hydrocerin   Topical TID  . insulin aspart  0-15 Units Subcutaneous TID WC  . insulin glargine  20 Units Subcutaneous BID  . levofloxacin  750 mg Oral Q48H  . metoprolol tartrate  12.5 mg Oral BID  . pantoprazole  40 mg Oral Daily  . polyethylene glycol  17 g Oral Daily  . potassium chloride  20 mEq Oral Daily  . sodium chloride  10-40 mL Intracatheter Q12H  . traZODone  25 mg Oral QHS   Continuous Infusions:   Time spent: 25 minutes.    LOS: 3 days   Amiee Wiley  [facility] Pager [phone].   *Please note that the hospitalists switch teams on Wednesdays. Please call the flow manager at [phone] if you are having difficulty reaching the hospitalist taking care of this patient as she can update you and provide the most up-to-date pager number of provider caring for the patient. If 8PM-8AM, please contact night-coverage at [email or web address], password [email or web address]  11/12/2013, 8:06 AM

## 2013-11-13 LAB — BASIC METABOLIC PANEL
BUN: 49 mg/dL — ABNORMAL HIGH (ref 6–23)
CO2: 31 mEq/L (ref 19–32)
Chloride: 100 mEq/L (ref 96–112)
GFR calc Af Amer: 21 mL/min — ABNORMAL LOW (ref >90)
GFR calc non Af Amer: 18 mL/min — ABNORMAL LOW (ref >90)
Glucose, Bld: 69 mg/dL — ABNORMAL LOW (ref 70–99)
Potassium: 4.9 mEq/L (ref 3.5–5.1)
Sodium: 136 mEq/L (ref 135–145)

## 2013-11-13 LAB — GLUCOSE, CAPILLARY
Glucose-Capillary: 66 mg/dL — ABNORMAL LOW (ref 70–99)
Glucose-Capillary: 163 mg/dL — ABNORMAL HIGH (ref 70–99)
Glucose-Capillary: 101 mg/dL — ABNORMAL HIGH (ref 70–99)
Glucose-Capillary: 95 mg/dL (ref 70–99)
Glucose-Capillary: 101 mg/dL — ABNORMAL HIGH (ref 70–99)
Glucose-Capillary: 94 mg/dL (ref 70–99)

## 2013-11-13 MED ORDER — INSULIN GLARGINE 100 UNIT/ML ~~LOC~~ SOLN
19.0000 [IU] | Freq: Two times a day (BID) | SUBCUTANEOUS | Status: DC
  Administered 2013-11-13 – 2013-11-16 (×6): 19 [IU] via SUBCUTANEOUS
  Filled 2013-11-13 (×7): qty 0.19

## 2013-11-13 MED ORDER — FOSFOMYCIN TROMETHAMINE 3 G PO PACK
3.0000 g | PACK | Freq: Once | ORAL | Status: AC
  Administered 2013-11-13: 17:00:00 3 g via ORAL
  Filled 2013-11-13: qty 3

## 2013-11-13 NOTE — Progress Notes (Signed)
[facility] PROGRESS NOTE   [name] ZOX:[id number] DOB: 1950/12/28 DOA: 11/09/2013 PCP: Willey Blade, MD  Brief narrative: [name] is an 62 y.o. female with 6 hospitalizations in the past 6 months and a past medical history of atrial fibrillation, morbid obesity, obstructive sleep apnea, chronic systolic and diastolic heart failure, chronic kidney disease, diabetes, COPD/asthma with most recent hospitalization from 10/13/2013-10/21/2013 for treatment of symptomatic anemia and decompensated heart failure, who was admitted 11/09/2013 with shortness of breath. Chest x-ray done on admission showed possible bilateral pneumonia versus interstitial anemia and her admission pro BNP was greater than 3000.  Assessment/Plan: Principal Problem:   Acute respiratory failure Appears to be multifactorial with chronic systolic/diastolic heart failure, obstructive sleep apnea/obesity hypoventilation syndrome, and possible healthcare associated pneumonia all potentially contributory. She continues to refuse CPAP. Had hypoxic event overnight on 11/12/2013 necessitating higher flow oxygen. Continue albuterol. Active Problems:   Constipation Bowels moved after Fleet enema 11/12/13.   Acute combined systolic and diastolic heart failure Elevated proBNP and chest radiograph consistent with pulmonary edema. Lasix has been increased to 40 mg 3 times a day (home dose 40 mg twice a day). I and O. balance -1.9 L, weight finally down. Last echocardiogram done 11/14 with an EF of 50-55% and grade 1 diastolic dysfunction.   History of CVA / STROKE Continue risk factor modification with control of blood pressure, lipids, and diabetes.   DM (diabetes mellitus) Last hemoglobin A1c 5.4% on 10/13/2013. Currently on Lantus 20 units twice a day and moderate scale SSI. CBGs [id number].  Will decrease Lantus to 19 units BID.   CKD (chronic kidney disease), stage IV Baseline creatinine 2-2.1. Current creatinine slightly  higher, likely from diuretic effects.   Anemia of chronic disease Baseline hemoglobin around 8 mg/dL. Current hemoglobin 7.4 mg/dL. Transfuse for hemoglobin less than 7.   PAF (paroxysmal atrial fibrillation) Rate controlled on amiodarone. Not currently a candidate for anticoagulation given history of hematoma.  Currently in NSR with HR in 60's (converted from atrial fibrillation at 11:12 10/11/2013).  Code Status: Full. Family Communication: Husband and cousin at bedside. Disposition Plan: SNF.   IV access:  Central line placed in right IJ by [facility] 11/10/13  Medical Consultants:  Pulmonology  Other Consultants:  None.  Anti-infectives:  Vancomycin 11/09/2013---> 11/10/2013  Aztreonam 11/09/2013---> 11/10/2013  Levaquin 11/10/2013--->  HPI/Subjective: [name] says her dyspnea is better and that she moved her bowels with the Fleet enema.  Appetite good.  No chest pain.  Objective: Filed Vitals:   11/12/13 1408 11/12/13 2113 11/13/13 0511 11/13/13 1327  BP: 137/45 150/38 163/49 139/38  Pulse: 55 62 68 51  Temp: 98.5 F (36.9 C) 98.2 F (36.8 C) 99.4 F (37.4 C) 97.9 F (36.6 C)  TempSrc: Oral Oral Oral Oral  Resp: 20 19 19    Height:      Weight:   125.4 kg (276 lb 7.3 oz)   SpO2: 96% 96% 96% 93%    Intake/Output Summary (Last 24 hours) at 11/13/13 1555 Last data filed at 11/13/13 1357  Gross per 24 hour  Intake    240 ml  Output   2201 ml  Net  -1961 ml    Exam: Gen:  NAD Cardiovascular:  RRR, No M/R/G Respiratory:  Lungs diminished but clear Gastrointestinal:  Abdomen soft, NT/ND, + BS Extremities:  3+ edema  Data Reviewed: asic Metabolic Panel:  Recent Labs Lab 11/09/13 1605 11/10/13 0424 11/11/13 0420 11/12/13 0445 11/13/13 0620  NA 140  138 138 137 136  K 4.2 4.9 4.9 4.8 4.9  CL 104 104 102 100 100  CO2 26 26 29 30 31   GLUCOSE 132* 102* 91 80 69*  BUN 31* 31* 42* 47* 49*  CREATININE 2.01* 2.15* 2.44* 2.56* 2.60*  CALCIUM 8.7 8.3*  8.4 8.2* 8.4   GFR Estimated Creatinine Clearance: 28.5 ml/min (by C-G formula based on Cr of 2.6). Liver Function Tests:  Recent Labs Lab 11/09/13 1605  AST 9  ALT 8  ALKPHOS 115  BILITOT 0.3  PROT 7.4  ALBUMIN 2.6*    CBC:  Recent Labs Lab 11/09/13 1605 11/10/13 0424 11/11/13 0420  WBC 10.0 8.7 7.9  NEUTROABS 8.8*  --   --   HGB 8.0* 7.3* 7.4*  HCT 26.7* 24.3* 25.2*  MCV 87.5 88.7 89.0  PLT 225 196 188   Cardiac Enzymes:  Recent Labs Lab 11/09/13 1605  TROPONINI <0.30   BNP (last 3 results)  Recent Labs  10/13/13 1503 10/13/13 2041 11/09/13 1605  PROBNP 2427.0* 2342.0* 3130.0*   CBG:  Recent Labs Lab 11/12/13 2111 11/13/13 0729 11/13/13 0804 11/13/13 1159 11/13/13 1313  GLUCAP 93 66* 163* 101* 95   Microbiology Recent Results (from the past 240 hour(s))  CULTURE, BLOOD (ROUTINE X 2)     Status: None   Collection Time    11/09/13  4:05 PM      Result Value Range Status   Specimen Description BLOOD LEFT ARM   Final   Special Requests BOTTLES DRAWN AEROBIC AND ANAEROBIC   Final   Culture  Setup Time     Final   Value: 11/09/2013 20:31     Performed at [facility]   Culture     Final   Value:        BLOOD CULTURE RECEIVED NO GROWTH TO DATE CULTURE WILL BE HELD FOR 5 DAYS BEFORE ISSUING A FINAL NEGATIVE REPORT     Performed at [facility]   Report Status PENDING   Incomplete  CULTURE, BLOOD (ROUTINE X 2)     Status: None   Collection Time    11/09/13  4:10 PM      Result Value Range Status   Specimen Description BLOOD LEFT ANTECUBITAL   Final   Special Requests BOTTLES DRAWN AEROBIC AND ANAEROBIC   Final   Culture  Setup Time     Final   Value: 11/09/2013 20:30     Performed at [facility]   Culture     Final   Value: STAPHYLOCOCCUS SPECIES (COAGULASE NEGATIVE)     Note: THE SIGNIFICANCE OF ISOLATING THIS ORGANISM FROM A SINGLE SET OF BLOOD CULTURES WHEN MULTIPLE SETS ARE DRAWN IS UNCERTAIN. PLEASE  NOTIFY THE MICROBIOLOGY DEPARTMENT WITHIN ONE WEEK IF SPECIATION AND SENSITIVITIES ARE REQUIRED.     Note: Gram Stain Report Called to,Read Back By and Verified With: ALLIE S @ 1508 11/10/13 BY KRAWS     Performed at [facility]   Report Status 11/11/2013 FINAL   Final  URINE CULTURE     Status: None   Collection Time    11/09/13  7:42 PM      Result Value Range Status   Specimen Description URINE, CLEAN CATCH   Final   Special Requests NONE   Final   Culture  Setup Time     Final   Value: 11/10/2013 02:46     Performed at [facility] Count  Final   Value: >=100,000 COLONIES/ML     Performed at [facility]   Culture     Final   Value: ESCHERICHIA COLI     Performed at [facility]   Report Status 11/12/2013 FINAL   Final   Organism ID, Bacteria ESCHERICHIA COLI   Final     Procedures and Diagnostic Studies: Dg Chest Port 1 View  11/09/2013   CLINICAL DATA:  Shortness of Breath  EXAM: PORTABLE CHEST - 1 VIEW  COMPARISON:  10/14/2013  FINDINGS: Cardiomegaly again noted. Bilateral hazy airspace disease which may be due to interstitial edema or bilateral pneumonia.  IMPRESSION: Bilateral airspace disease which may be due to bilateral pneumonia or bilateral pulmonary edema. Cardiomegaly again noted.   Electronically Signed   By: Natasha Mead M.D.   On: 11/09/2013 15:28   Dg Chest Port 1 View  10/14/2013   CLINICAL DATA:  Left-sided central line placement  EXAM: PORTABLE CHEST - 1 VIEW  COMPARISON:  10/13/2013  FINDINGS: Left-sided subclavian central line has its tip in the SVC 3 cm above the right atrium. No pneumothorax. Cardiomegaly persists with edema and or pneumonia bilaterally.  IMPRESSION: Left subclavian central line well positioned with its tip in the SVC 3 cm above the right atrium. No pneumothorax.  Cardiomegaly and pulmonary edema and or bilateral pneumonia.   Electronically Signed   By: Paulina Fusi M.D.   On: 10/14/2013 13:27   Dg  Chest Port 1 View  10/13/2013   CLINICAL DATA:  Shortness of breath  EXAM: PORTABLE CHEST - 1 VIEW  COMPARISON:  10/07/2013  FINDINGS: Cardiac shadow remains enlarged. Increased vascular congestion and mild pulmonary edema is noted. No focal confluent infiltrate is seen. No bony abnormality is noted.  IMPRESSION: Changes consistent with CHF.   Electronically Signed   By: Alcide Clever M.D.   On: 10/13/2013 16:43    Scheduled Meds: . allopurinol  100 mg Oral Daily  . amiodarone  200 mg Oral Daily  . amLODipine  10 mg Oral Daily  . aspirin  325 mg Oral Daily  . atorvastatin  20 mg Oral QHS  . docusate sodium  100 mg Oral BID  . enoxaparin (LOVENOX) injection  40 mg Subcutaneous Q24H  . ferrous sulfate  325 mg Oral TID WC  . fosfomycin  3 g Oral Once  . furosemide  40 mg Intravenous Q12H  . hydrALAZINE  50 mg Oral TID  . hydrocerin   Topical TID  . insulin aspart  0-15 Units Subcutaneous TID WC  . insulin glargine  20 Units Subcutaneous BID  . levofloxacin  750 mg Oral Q48H  . metoprolol tartrate  12.5 mg Oral BID  . pantoprazole  40 mg Oral Daily  . polyethylene glycol  17 g Oral Daily  . potassium chloride  20 mEq Oral Daily  . sodium chloride  10-40 mL Intracatheter Q12H  . traZODone  25 mg Oral QHS   Continuous Infusions:   Time spent: 25 minutes.    LOS: 4 days   Brailey Buescher  [facility] Pager [phone].   *Please note that the hospitalists switch teams on Wednesdays. Please call the flow manager at [phone] if you are having difficulty reaching the hospitalist taking care of this patient as she can update you and provide the most up-to-date pager number of provider caring for the patient. If 8PM-8AM, please contact night-coverage at [email or web address], password [email or web address]  11/13/2013, 3:55 PM

## 2013-11-13 NOTE — Progress Notes (Signed)
Discussed PT's recommendation with Pt.  Pt was very upset by the recommendation, as she wants to go home.  Pt stated, "What do they want me to do?  Do they want me to live there?"  CSW provided emotional support and encouraged Pt to talk with her family about the recommendations.  Weekday CSW to follow.  Providence Crosby, LCSWA Clinical Social Work [phone]

## 2013-11-13 NOTE — Progress Notes (Signed)
Per MD, Pt ready to return to [facility].  Contacted the Admissions Coordinator, Rokkel, to inquire about admission today.  Per Rokkel, [employer] requires pre-authorization that cannot be obtained over the weekend.  Rokkel stated that she can have everything ready for Pt to be admitted on Monday.  Notified MD.  Mosie Epstein CSW to follow.  Providence Crosby, LCSWA Clinical Social Work [phone]

## 2013-11-13 NOTE — Evaluation (Signed)
Physical Therapy Evaluation Patient Details Name: [name] MRN: [id number] DOB: 09-09-1951 Today's Date: 11/13/2013 Time: 9147-8295 PT Time Calculation (min): 20 min  PT Assessment / Plan / Recommendation History of Present Illness  62 yo female admitted with acute resp failure from SNF. Hx of HTN, DM Afib, obesity, lymphedema  Clinical Impression  On eval, pt required Min assist for mobility-able to perform stand pivot from bed to recliner with RW. Not quite ready to attempt ambulation due to fatigue, decreased activity tolerance. Recommend return to SNF for continued rehab to improve strength, activity tolerance.     PT Assessment  Patient needs continued PT services    Follow Up Recommendations  SNF    Does the patient have the potential to tolerate intense rehabilitation      Barriers to Discharge        Equipment Recommendations  None recommended by PT    Recommendations for Other Services OT consult   Frequency Min 3X/week    Precautions / Restrictions Precautions Precautions: Fall Precaution Comments: decreased activity tolerance. O2 dep Restrictions Weight Bearing Restrictions: No   Pertinent Vitals/Pain L knee with activity-unrated      Mobility  Bed Mobility Bed Mobility: Supine to Sit Supine to Sit: HOB elevated;With rails;4: Min assist Details for Bed Mobility Assistance: pt uses momentum to get trunk upright. Increased time.  Transfers Transfers: Sit to Stand;Stand to [employer] Transfers Sit to Stand: 4: Min assist;From bed Stand to Sit: 4: Min assist;To bed Stand Pivot Transfers: 4: Min assist Details for Transfer Assistance: Assist to rise, stabilize, control descent. Increased time. Sit to stand x 2 for hygiene-pt incontinent of bowels. stand pivot bed>recliner with rw. dyspnea 3/4 Ambulation/Gait Ambulation/Gait Assistance: Not tested (comment)    Exercises     PT Diagnosis: Difficulty walking;Generalized weakness  PT Problem  List: Decreased strength;Decreased activity tolerance;Decreased balance;Decreased mobility;Obesity;Decreased knowledge of use of DME PT Treatment Interventions: DME instruction;Gait training;Functional mobility training;Therapeutic activities;Therapeutic exercise;Patient/family education     PT Goals(Current goals can be found in the care plan section) Acute Rehab PT Goals Patient Stated Goal: to get stronger so she can go home PT Goal Formulation: With patient Time For Goal Achievement: 11/27/13 Potential to Achieve Goals: Good  Visit Information  Last PT Received On: 11/13/13 Assistance Needed:  (+1 for pivot, +2 for amb) History of Present Illness: 62 yo female admitted with acute resp failure from SNF. Hx of HTN, DM Afib, obesity, lymphedema       Prior Functioning  Home Living Family/patient expects to be discharged to:: Skilled nursing facility Prior Function Level of Independence: Needs assistance    Cognition  Cognition Arousal/Alertness: Awake/alert Behavior During Therapy: WFL for tasks assessed/performed Overall Cognitive Status: Within Functional Limits for tasks assessed    Extremity/Trunk Assessment Upper Extremity Assessment Upper Extremity Assessment: Generalized weakness Lower Extremity Assessment Lower Extremity Assessment: Generalized weakness Cervical / Trunk Assessment Cervical / Trunk Assessment: Normal   Balance    End of Session PT - End of Session Equipment Utilized During Treatment: Oxygen Activity Tolerance: Patient limited by fatigue Patient left: in chair;with call bell/phone within reach  GP     Rebeca Alert, MPT Pager: [phone]

## 2013-11-14 DIAGNOSIS — S81801A Unspecified open wound, right lower leg, initial encounter: Secondary | ICD-10-CM

## 2013-11-14 DIAGNOSIS — S81009A Unspecified open wound, unspecified knee, initial encounter: Secondary | ICD-10-CM

## 2013-11-14 DIAGNOSIS — R110 Nausea: Secondary | ICD-10-CM

## 2013-11-14 LAB — CBC
Hemoglobin: 7.2 g/dL — ABNORMAL LOW (ref 12.0–15.0)
MCH: 25.8 pg — ABNORMAL LOW (ref 26.0–34.0)
MCHC: 29.1 g/dL — ABNORMAL LOW (ref 30.0–36.0)
RDW: 16.4 % — ABNORMAL HIGH (ref 11.5–15.5)

## 2013-11-14 LAB — BASIC METABOLIC PANEL
BUN: 54 mg/dL — ABNORMAL HIGH (ref 6–23)
Calcium: 8.2 mg/dL — ABNORMAL LOW (ref 8.4–10.5)
Chloride: 99 mEq/L (ref 96–112)
Creatinine, Ser: 2.61 mg/dL — ABNORMAL HIGH (ref 0.50–1.10)
GFR calc Af Amer: 21 mL/min — ABNORMAL LOW (ref >90)
GFR calc non Af Amer: 18 mL/min — ABNORMAL LOW (ref >90)
Glucose, Bld: 110 mg/dL — ABNORMAL HIGH (ref 70–99)

## 2013-11-14 LAB — GLUCOSE, CAPILLARY: Glucose-Capillary: 121 mg/dL — ABNORMAL HIGH (ref 70–99)

## 2013-11-14 MED ORDER — ALBUTEROL SULFATE (5 MG/ML) 0.5% IN NEBU
2.5000 mg | INHALATION_SOLUTION | Freq: Four times a day (QID) | RESPIRATORY_TRACT | Status: DC | PRN
  Administered 2013-11-15: 05:00:00 2.5 mg via RESPIRATORY_TRACT
  Filled 2013-11-14: qty 0.5

## 2013-11-14 MED ORDER — DIPHENHYDRAMINE-ZINC ACETATE 2-0.1 % EX CREA
TOPICAL_CREAM | Freq: Three times a day (TID) | CUTANEOUS | Status: DC | PRN
  Administered 2013-11-14: 1 via TOPICAL
  Filled 2013-11-14: qty 28

## 2013-11-14 MED ORDER — DIPHENHYDRAMINE HCL 25 MG PO CAPS
25.0000 mg | ORAL_CAPSULE | ORAL | Status: DC | PRN
  Administered 2013-11-14 – 2013-11-15 (×3): 25 mg via ORAL
  Filled 2013-11-14 (×3): qty 1

## 2013-11-14 MED ORDER — METOCLOPRAMIDE HCL 5 MG PO TABS
5.0000 mg | ORAL_TABLET | Freq: Three times a day (TID) | ORAL | Status: DC
  Administered 2013-11-14 – 2013-11-16 (×7): 5 mg via ORAL
  Filled 2013-11-14 (×8): qty 1

## 2013-11-14 NOTE — Progress Notes (Signed)
[facility] PROGRESS NOTE   KANI JOBSON ZOX:[id number] DOB: [birth year] DOA: 11/09/2013 PCP: Willey Blade, MD  Brief narrative: [name] is an 62 y.o. female with 6 hospitalizations in the past 6 months and a past medical history of atrial fibrillation, morbid obesity, obstructive sleep apnea, chronic systolic and diastolic heart failure, chronic kidney disease, diabetes, COPD/asthma with most recent hospitalization from 10/13/2013-10/21/2013 for treatment of symptomatic anemia and decompensated heart failure, who was admitted 11/09/2013 with shortness of breath. Chest x-ray done on admission showed possible bilateral pneumonia versus interstitial anemia and her admission pro BNP was greater than 3000.  Assessment/Plan: Principal Problem:   Acute respiratory failure Appears to be multifactorial with chronic systolic/diastolic heart failure, obstructive sleep apnea/obesity hypoventilation syndrome, and possible healthcare associated pneumonia all potentially contributory. She continues to refuse CPAP. Had hypoxic event overnight on 11/12/2013 necessitating higher flow oxygen. Continue albuterol. Active Problems:   Nausea Trial Reglan, ? Gastroparesis.   Constipation Bowels moved after Fleet enema 11/12/13.   Acute combined systolic and diastolic heart failure Elevated proBNP and chest radiograph consistent with pulmonary edema. Lasix has been increased to 40 mg 3 times a day (home dose 40 mg twice a day). I and O. balance -1.9 L, weight finally down. Last echocardiogram done 11/14 with an EF of 50-55% and grade 1 diastolic dysfunction.   History of CVA / STROKE Continue risk factor modification with control of blood pressure, lipids, and diabetes.   DM (diabetes mellitus) Last hemoglobin A1c 5.4% on 10/13/2013. Currently on Lantus 19 units twice a day and moderate scale SSI. CBGs 94-101.    CKD (chronic kidney disease), stage IV Baseline creatinine 2-2.1. Current creatinine  slightly higher, likely from diuretic effects.   Anemia of chronic disease Baseline hemoglobin around 8 mg/dL. Current hemoglobin 7.4 mg/dL. Transfuse for hemoglobin less than 7.   PAF (paroxysmal atrial fibrillation) Rate controlled on amiodarone. Not currently a candidate for anticoagulation given history of hematoma.  Currently in NSR with HR in 60's (converted from atrial fibrillation at 11:12 10/11/2013).  Code Status: Full. Family Communication: Husband and cousin at bedside. Disposition Plan: SNF.   IV access:  Central line placed in right IJ by [facility] 11/10/13  Medical Consultants:  Pulmonology  Other Consultants:  None.  Anti-infectives:  Vancomycin 11/09/2013---> 11/10/2013  Aztreonam 11/09/2013---> 11/10/2013  Levaquin 11/10/2013--->  HPI/Subjective: [name] says she has been nauseated and has been unable to eat her lunch.  Complains of itchy red skin to right abdomen, and ulcers on her right lower leg.  Dyspnea improved.  Objective: Filed Vitals:   11/13/13 1327 11/13/13 2300 11/14/13 0525 11/14/13 0746  BP: 139/38 145/54 142/60   Pulse: 51 70 68   Temp: 97.9 F (36.6 C) 98 F (36.7 C) 98 F (36.7 C)   TempSrc: Oral Oral Oral   Resp:  22 22   Height:      Weight:   126.554 kg (279 lb)   SpO2: 93% 95% 96% 96%    Intake/Output Summary (Last 24 hours) at 11/14/13 8119 Last data filed at 11/14/13 0500  Gross per 24 hour  Intake    600 ml  Output   1950 ml  Net  -1350 ml    Exam: Gen:  NAD Cardiovascular:  RRR, No M/R/G Respiratory:  Lungs diminished but clear Gastrointestinal:  Abdomen soft, NT/ND, + BS Extremities:  3+ edema  Data Reviewed: asic Metabolic Panel:  Recent Labs Lab 11/10/13 0424 11/11/13 0420 11/12/13 0445 11/13/13 1478  11/14/13 0358  NA 138 138 137 136 135  K 4.9 4.9 4.8 4.9 4.5  CL 104 102 100 100 99  CO2 26 29 30 31 31   GLUCOSE 102* 91 80 69* 110*  BUN 31* 42* 47* 49* 54*  CREATININE 2.15* 2.44* 2.56* 2.60*  2.61*  CALCIUM 8.3* 8.4 8.2* 8.4 8.2*   GFR Estimated Creatinine Clearance: 28.6 ml/min (by C-G formula based on Cr of 2.61). Liver Function Tests:  Recent Labs Lab 11/09/13 1605  AST 9  ALT 8  ALKPHOS 115  BILITOT 0.3  PROT 7.4  ALBUMIN 2.6*    CBC:  Recent Labs Lab 11/09/13 1605 11/10/13 0424 11/11/13 0420 11/14/13 0358  WBC 10.0 8.7 7.9 6.8  NEUTROABS 8.8*  --   --   --   HGB 8.0* 7.3* 7.4* 7.2*  HCT 26.7* 24.3* 25.2* 24.7*  MCV 87.5 88.7 89.0 88.5  PLT 225 196 188 217   Cardiac Enzymes:  Recent Labs Lab 11/09/13 1605  TROPONINI <0.30   BNP (last 3 results)  Recent Labs  10/13/13 1503 10/13/13 2041 11/09/13 1605  PROBNP 2427.0* 2342.0* 3130.0*   CBG:  Recent Labs Lab 11/13/13 1159 11/13/13 1313 11/13/13 1723 11/13/13 2307 11/14/13 0753  GLUCAP 101* 95 101* 94 98   Microbiology Recent Results (from the past 240 hour(s))  CULTURE, BLOOD (ROUTINE X 2)     Status: None   Collection Time    11/09/13  4:05 PM      Result Value Range Status   Specimen Description BLOOD LEFT ARM   Final   Special Requests BOTTLES DRAWN AEROBIC AND ANAEROBIC   Final   Culture  Setup Time     Final   Value: 11/09/2013 20:31     Performed at [facility]   Culture     Final   Value:        BLOOD CULTURE RECEIVED NO GROWTH TO DATE CULTURE WILL BE HELD FOR 5 DAYS BEFORE ISSUING A FINAL NEGATIVE REPORT     Performed at [facility]   Report Status PENDING   Incomplete  CULTURE, BLOOD (ROUTINE X 2)     Status: None   Collection Time    11/09/13  4:10 PM      Result Value Range Status   Specimen Description BLOOD LEFT ANTECUBITAL   Final   Special Requests BOTTLES DRAWN AEROBIC AND ANAEROBIC   Final   Culture  Setup Time     Final   Value: 11/09/2013 20:30     Performed at [facility]   Culture     Final   Value: STAPHYLOCOCCUS SPECIES (COAGULASE NEGATIVE)     Note: THE SIGNIFICANCE OF ISOLATING THIS ORGANISM FROM A SINGLE SET  OF BLOOD CULTURES WHEN MULTIPLE SETS ARE DRAWN IS UNCERTAIN. PLEASE NOTIFY THE MICROBIOLOGY DEPARTMENT WITHIN ONE WEEK IF SPECIATION AND SENSITIVITIES ARE REQUIRED.     Note: Gram Stain Report Called to,Read Back By and Verified With: ALLIE S @ 1508 11/10/13 BY KRAWS     Performed at [facility]   Report Status 11/11/2013 FINAL   Final  URINE CULTURE     Status: None   Collection Time    11/09/13  7:42 PM      Result Value Range Status   Specimen Description URINE, CLEAN CATCH   Final   Special Requests NONE   Final   Culture  Setup Time     Final   Value: 11/10/2013  02:46     Performed at [facility] Count     Final   Value: >=100,000 COLONIES/ML     Performed at [facility]   Culture     Final   Value: ESCHERICHIA COLI     Performed at [facility]   Report Status 11/12/2013 FINAL   Final   Organism ID, Bacteria ESCHERICHIA COLI   Final     Procedures and Diagnostic Studies: Dg Chest Port 1 View  11/09/2013   CLINICAL DATA:  Shortness of Breath  EXAM: PORTABLE CHEST - 1 VIEW  COMPARISON:  10/14/2013  FINDINGS: Cardiomegaly again noted. Bilateral hazy airspace disease which may be due to interstitial edema or bilateral pneumonia.  IMPRESSION: Bilateral airspace disease which may be due to bilateral pneumonia or bilateral pulmonary edema. Cardiomegaly again noted.   Electronically Signed   By: Natasha Mead M.D.   On: 11/09/2013 15:28   Dg Chest Port 1 View  10/14/2013   CLINICAL DATA:  Left-sided central line placement  EXAM: PORTABLE CHEST - 1 VIEW  COMPARISON:  10/13/2013  FINDINGS: Left-sided subclavian central line has its tip in the SVC 3 cm above the right atrium. No pneumothorax. Cardiomegaly persists with edema and or pneumonia bilaterally.  IMPRESSION: Left subclavian central line well positioned with its tip in the SVC 3 cm above the right atrium. No pneumothorax.  Cardiomegaly and pulmonary edema and or bilateral pneumonia.    Electronically Signed   By: Paulina Fusi M.D.   On: 10/14/2013 13:27   Dg Chest Port 1 View  10/13/2013   CLINICAL DATA:  Shortness of breath  EXAM: PORTABLE CHEST - 1 VIEW  COMPARISON:  10/07/2013  FINDINGS: Cardiac shadow remains enlarged. Increased vascular congestion and mild pulmonary edema is noted. No focal confluent infiltrate is seen. No bony abnormality is noted.  IMPRESSION: Changes consistent with CHF.   Electronically Signed   By: Alcide Clever M.D.   On: 10/13/2013 16:43    Scheduled Meds: . allopurinol  100 mg Oral Daily  . amiodarone  200 mg Oral Daily  . amLODipine  10 mg Oral Daily  . aspirin  325 mg Oral Daily  . atorvastatin  20 mg Oral QHS  . docusate sodium  100 mg Oral BID  . enoxaparin (LOVENOX) injection  40 mg Subcutaneous Q24H  . ferrous sulfate  325 mg Oral TID WC  . furosemide  40 mg Intravenous Q12H  . hydrALAZINE  50 mg Oral TID  . hydrocerin   Topical TID  . insulin aspart  0-15 Units Subcutaneous TID WC  . insulin glargine  19 Units Subcutaneous BID  . levofloxacin  750 mg Oral Q48H  . metoprolol tartrate  12.5 mg Oral BID  . pantoprazole  40 mg Oral Daily  . polyethylene glycol  17 g Oral Daily  . potassium chloride  20 mEq Oral Daily  . sodium chloride  10-40 mL Intracatheter Q12H  . traZODone  25 mg Oral QHS   Continuous Infusions:   Time spent: 35 minutes with > 50% of time discussing current diagnostic test results, clinical impression and plan of care with the patient and met with her husband for a separate meeting to discuss her prognosis, recommendations for aftercare at SNF.     LOS: 5 days   Suzana Sohail  [facility] Pager [phone].   *Please note that the hospitalists switch teams on Wednesdays. Please call the flow manager at [phone] if you  are having difficulty reaching the hospitalist taking care of this patient as she can update you and provide the most up-to-date pager number of provider caring for the patient. If  8PM-8AM, please contact night-coverage at [email or web address], password [facility]  11/14/2013, 8:12 AM

## 2013-11-15 LAB — CBC
HCT: 24.1 % — ABNORMAL LOW (ref 36.0–46.0)
Hemoglobin: 7.2 g/dL — ABNORMAL LOW (ref 12.0–15.0)
MCH: 26.3 pg (ref 26.0–34.0)
MCV: 88 fL (ref 78.0–100.0)
Platelets: 205 10*3/uL (ref 150–400)
RDW: 16.7 % — ABNORMAL HIGH (ref 11.5–15.5)
WBC: 7.4 10*3/uL (ref 4.0–10.5)

## 2013-11-15 LAB — CULTURE, BLOOD (ROUTINE X 2): Culture: NO GROWTH

## 2013-11-15 LAB — GLUCOSE, CAPILLARY
Glucose-Capillary: 77 mg/dL (ref 70–99)
Glucose-Capillary: 93 mg/dL (ref 70–99)
Glucose-Capillary: 92 mg/dL (ref 70–99)

## 2013-11-15 LAB — BASIC METABOLIC PANEL
BUN: 50 mg/dL — ABNORMAL HIGH (ref 6–23)
CO2: 31 mEq/L (ref 19–32)
Calcium: 8.4 mg/dL (ref 8.4–10.5)
Chloride: 100 mEq/L (ref 96–112)
Creatinine, Ser: 2.49 mg/dL — ABNORMAL HIGH (ref 0.50–1.10)
GFR calc Af Amer: 23 mL/min — ABNORMAL LOW (ref >90)

## 2013-11-15 MED ORDER — POLYETHYLENE GLYCOL 3350 17 G PO PACK
17.0000 g | PACK | Freq: Two times a day (BID) | ORAL | Status: DC
  Administered 2013-11-15 – 2013-11-16 (×2): 17 g via ORAL
  Filled 2013-11-15 (×3): qty 1

## 2013-11-15 MED ORDER — ALBUTEROL SULFATE (5 MG/ML) 0.5% IN NEBU: 2.5000 mg | INHALATION_SOLUTION | RESPIRATORY_TRACT | Status: DC | PRN

## 2013-11-15 MED ORDER — POTASSIUM CHLORIDE ER 20 MEQ PO TBCR: 20.0000 meq | EXTENDED_RELEASE_TABLET | Freq: Every day | ORAL | Status: DC

## 2013-11-15 MED ORDER — POLYETHYLENE GLYCOL 3350 17 G PO PACK: 17.0000 g | PACK | Freq: Two times a day (BID) | ORAL | Status: DC

## 2013-11-15 MED ORDER — DIPHENHYDRAMINE HCL 25 MG PO CAPS: 25.0000 mg | ORAL_CAPSULE | ORAL | Status: DC | PRN

## 2013-11-15 MED ORDER — DIPHENHYDRAMINE-ZINC ACETATE 2-0.1 % EX CREA: TOPICAL_CREAM | Freq: Three times a day (TID) | CUTANEOUS | Status: DC | PRN

## 2013-11-15 MED ORDER — METOCLOPRAMIDE HCL 5 MG PO TABS: 5.0000 mg | ORAL_TABLET | Freq: Three times a day (TID) | ORAL | Status: DC

## 2013-11-15 MED ORDER — ALBUTEROL SULFATE (5 MG/ML) 0.5% IN NEBU
2.5000 mg | INHALATION_SOLUTION | Freq: Four times a day (QID) | RESPIRATORY_TRACT | Status: DC
  Administered 2013-11-15 – 2013-11-16 (×6): 2.5 mg via RESPIRATORY_TRACT
  Filled 2013-11-15 (×6): qty 0.5

## 2013-11-15 MED ORDER — INSULIN ASPART 100 UNIT/ML ~~LOC~~ SOLN: 0.0000 [IU] | Freq: Three times a day (TID) | SUBCUTANEOUS | Status: DC

## 2013-11-15 NOTE — Progress Notes (Signed)
CSW & RNCM spoke with patient who is agreeable to go back to [facility] before returning home with her husband.   CSW awaiting PT note from today to submit to [facility] for insurance authorization. Updated clinicals submitted earlier, but they are requesting PT notes from today. PTA, Lawson Fiscal made aware. [facility] has bed available for patient once authorization is obtained. CSW will check back in the morning.    Unice Bailey, LCSW [facility] Clinical Social Worker cell #: [phone]

## 2013-11-15 NOTE — Progress Notes (Signed)
Inpatient Diabetes Program Recommendations  AACE/ADA: New Consensus Statement on Inpatient Glycemic Control (2013)  Target Ranges:  Prepandial:   less than 140 mg/dL      Peak postprandial:   less than 180 mg/dL (1-2 hours)      Critically ill patients:  140 - 180 mg/dL   Results for [name], [name] (MRN [id number]) as of 11/15/2013 12:03  Ref. Range 11/14/2013 07:53 11/14/2013 12:18 11/14/2013 16:47 11/14/2013 20:36 11/15/2013 07:20 11/15/2013 11:43  Glucose-Capillary Latest Range: 70-99 mg/dL 98 409 (H) 811 (H) 914 (H) 77 93  Results for [name], [name] (MRN [id number]) as of 11/15/2013 12:03  Ref. Range 11/15/2013 04:15  BUN Latest Range: 6-23 mg/dL 50 (H)  Creatinine Latest Range: 0.50-1.10 mg/dL 0.86 (H)   Inpatient Diabetes Program Recommendations Correction (SSI): Please consider decreasing Novolog correction to sensitive scale.  Thanks, Orlando Penner, RN, MSN, CCRN Diabetes Coordinator Inpatient Diabetes Program [phone] (Team Pager) [phone] (AP office) [phone] [facility] office)

## 2013-11-15 NOTE — Consult Note (Signed)
WOC wound consult note Reason for Consult: evaluation of RLE ulcer.  Pt with history of skin graft per Dr. Leta Baptist 10/30 and subsequent use of NPWT VAC, this has been discontinued and she has been receiving  wound care at [facility].  She is not able to tell me what this treatment consist of. She has been followed by [facility].   Wound type: 2 areas of full thick skin breakdown over her existing skin graft.  Measurement:distal: 2.0cm x 2.0cm x 0.2cm; proximal 2.0cm x 3.0cm x 0.3cm  Wound bed: both are pink, moist. They did have some thick exudate/crusting over them I have removed and visualized the wound bed.  Drainage (amount, consistency, odor) minimal serous, no odor. Does not appear infected.  Periwound:intact Dressing procedure/placement/frequency: silver hydrofiber for chronic areas of the medial RLE.  Cut to fit and place over the open areas, change every other day.   Follow up with [facility] at the time of discharge.  Discussed POC with patient and bedside nurse.  Re consult if needed, will not follow at this time. Thanks  Marga Gramajo [employer], CWOCN [phone])

## 2013-11-15 NOTE — Progress Notes (Signed)
[facility] PROGRESS NOTE   [name] ZOX:[id number] DOB: 09-13-1951 DOA: 11/09/2013 PCP: Willey Blade, MD  Brief narrative: [name] is an 62 y.o. female with 6 hospitalizations in the past 6 months and a past medical history of atrial fibrillation, morbid obesity, obstructive sleep apnea, chronic systolic and diastolic heart failure, chronic kidney disease, diabetes, COPD/asthma with most recent hospitalization from 10/13/2013-10/21/2013 for treatment of symptomatic anemia and decompensated heart failure, who was admitted 11/09/2013 with shortness of breath. Chest x-ray done on admission showed possible bilateral pneumonia versus interstitial anemia and her admission pro BNP was greater than 3000. She is medically stable for discharge but her insurance has not cleared her to return to her SNF.  Assessment/Plan: Principal Problem:   Acute respiratory failure Appears to be multifactorial with chronic systolic/diastolic heart failure, obstructive sleep apnea/obesity hypoventilation syndrome, and possible healthcare associated pneumonia all potentially contributory. She continues to refuse CPAP. Had hypoxic event overnight on 11/12/2013 necessitating higher flow oxygen. Continue albuterol. Active Problems:   Nausea Trial Reglan, ? Gastroparesis.   Constipation Bowels moved after Fleet enema 11/12/13.   Acute combined systolic and diastolic heart failure Elevated proBNP and chest radiograph consistent with pulmonary edema. Lasix has been increased to 40 mg 3 times a day (home dose 40 mg twice a day). I and O. balance -1.0 L, weight variable. Last echocardiogram done 11/14 with an EF of 50-55% and grade 1 diastolic dysfunction.   History of CVA / STROKE Continue risk factor modification with control of blood pressure, lipids, and diabetes.   DM (diabetes mellitus) Last hemoglobin A1c 5.4% on 10/13/2013. Currently on Lantus 19 units twice a day and moderate scale SSI. CBGs 77-121.     CKD (chronic kidney disease), stage IV Baseline creatinine 2-2.1. Current creatinine slightly higher, likely from diuretic effects.   Anemia of chronic disease Baseline hemoglobin around 8 mg/dL. Current hemoglobin 7.2 mg/dL. No current indication for transfusion.   PAF (paroxysmal atrial fibrillation) Rate controlled on amiodarone. Not currently a candidate for anticoagulation given history of hematoma.  Currently in NSR with HR in 60's (converted from atrial fibrillation at 11:12 10/11/2013).  Code Status: Full. Family Communication: No family currently at bedside. Husband updated yesterday. Disposition Plan: SNF.   IV access:  Central line placed in right IJ by [facility] 11/10/13  Medical Consultants:  Pulmonology  Other Consultants:  None.  Anti-infectives:  Vancomycin 11/09/2013---> 11/10/2013  Aztreonam 11/09/2013---> 11/10/2013  Levaquin 11/10/2013--->  HPI/Subjective: [name] continues to complain of abdominal discomfort and occasional nausea. She is very anxious and upset when I discussed her discharge planning. Her husband tells me that she wants to go home but that he cannot provide her adequate care. She continues to have some shortness of breath but is unwilling to use CPAP.  Objective: Filed Vitals:   11/15/13 0711 11/15/13 0823 11/15/13 0912 11/15/13 1406  BP: 143/41  161/41 144/43  Pulse:  65 57 52  Temp:    98.1 F (36.7 C)  TempSrc:    Oral  Resp: 24 24  20   Height:      Weight:      SpO2:  94%  96%    Intake/Output Summary (Last 24 hours) at 11/15/13 1611 Last data filed at 11/15/13 1407  Gross per 24 hour  Intake    670 ml  Output   1725 ml  Net  -1055 ml    Exam: Gen:  NAD Cardiovascular:  RRR, No M/R/G Respiratory:  Lungs diminished  but clear Gastrointestinal:  Abdomen soft, NT/ND, + BS Extremities:  3+ edema  Data Reviewed: asic Metabolic Panel:  Recent Labs Lab 11/11/13 0420 11/12/13 0445 11/13/13 0620 11/14/13 0358  11/15/13 0415  NA 138 137 136 135 136  K 4.9 4.8 4.9 4.5 4.5  CL 102 100 100 99 100  CO2 29 30 31 31 31   GLUCOSE 91 80 69* 110* 88  BUN 42* 47* 49* 54* 50*  CREATININE 2.44* 2.56* 2.60* 2.61* 2.49*  CALCIUM 8.4 8.2* 8.4 8.2* 8.4   GFR Estimated Creatinine Clearance: 30.2 ml/min (by C-G formula based on Cr of 2.49). Liver Function Tests:  Recent Labs Lab 11/09/13 1605  AST 9  ALT 8  ALKPHOS 115  BILITOT 0.3  PROT 7.4  ALBUMIN 2.6*    CBC:  Recent Labs Lab 11/09/13 1605 11/10/13 0424 11/11/13 0420 11/14/13 0358 11/15/13 0415  WBC 10.0 8.7 7.9 6.8 7.4  NEUTROABS 8.8*  --   --   --   --   HGB 8.0* 7.3* 7.4* 7.2* 7.2*  HCT 26.7* 24.3* 25.2* 24.7* 24.1*  MCV 87.5 88.7 89.0 88.5 88.0  PLT 225 196 188 217 205   Cardiac Enzymes:  Recent Labs Lab 11/09/13 1605  TROPONINI <0.30   BNP (last 3 results)  Recent Labs  10/13/13 1503 10/13/13 2041 11/09/13 1605  PROBNP 2427.0* 2342.0* 3130.0*   CBG:  Recent Labs Lab 11/14/13 1218 11/14/13 1647 11/14/13 2036 11/15/13 0720 11/15/13 1143  GLUCAP 119* 108* 121* 77 93   Microbiology Recent Results (from the past 240 hour(s))  CULTURE, BLOOD (ROUTINE X 2)     Status: None   Collection Time    11/09/13  4:05 PM      Result Value Range Status   Specimen Description BLOOD LEFT ARM   Final   Special Requests BOTTLES DRAWN AEROBIC AND ANAEROBIC   Final   Culture  Setup Time     Final   Value: 11/09/2013 20:31     Performed at [facility]   Culture     Final   Value: NO GROWTH 5 DAYS     Performed at [facility]   Report Status 11/15/2013 FINAL   Final  CULTURE, BLOOD (ROUTINE X 2)     Status: None   Collection Time    11/09/13  4:10 PM      Result Value Range Status   Specimen Description BLOOD LEFT ANTECUBITAL   Final   Special Requests BOTTLES DRAWN AEROBIC AND ANAEROBIC   Final   Culture  Setup Time     Final   Value: 11/09/2013 20:30     Performed at [facility]    Culture     Final   Value: STAPHYLOCOCCUS SPECIES (COAGULASE NEGATIVE)     Note: THE SIGNIFICANCE OF ISOLATING THIS ORGANISM FROM A SINGLE SET OF BLOOD CULTURES WHEN MULTIPLE SETS ARE DRAWN IS UNCERTAIN. PLEASE NOTIFY THE MICROBIOLOGY DEPARTMENT WITHIN ONE WEEK IF SPECIATION AND SENSITIVITIES ARE REQUIRED.     Note: Gram Stain Report Called to,Read Back By and Verified With: ALLIE S @ 1508 11/10/13 BY KRAWS     Performed at [facility]   Report Status 11/11/2013 FINAL   Final  URINE CULTURE     Status: None   Collection Time    11/09/13  7:42 PM      Result Value Range Status   Specimen Description URINE, CLEAN CATCH   Final   Special Requests  NONE   Final   Culture  Setup Time     Final   Value: 11/10/2013 02:46     Performed at [facility] Count     Final   Value: >=100,000 COLONIES/ML     Performed at [facility]   Culture     Final   Value: ESCHERICHIA COLI     Performed at [facility]   Report Status 11/12/2013 FINAL   Final   Organism ID, Bacteria ESCHERICHIA COLI   Final     Procedures and Diagnostic Studies: Dg Chest Port 1 View  11/09/2013   CLINICAL DATA:  Shortness of Breath  EXAM: PORTABLE CHEST - 1 VIEW  COMPARISON:  10/14/2013  FINDINGS: Cardiomegaly again noted. Bilateral hazy airspace disease which may be due to interstitial edema or bilateral pneumonia.  IMPRESSION: Bilateral airspace disease which may be due to bilateral pneumonia or bilateral pulmonary edema. Cardiomegaly again noted.   Electronically Signed   By: Natasha Mead M.D.   On: 11/09/2013 15:28   Dg Chest Port 1 View  10/14/2013   CLINICAL DATA:  Left-sided central line placement  EXAM: PORTABLE CHEST - 1 VIEW  COMPARISON:  10/13/2013  FINDINGS: Left-sided subclavian central line has its tip in the SVC 3 cm above the right atrium. No pneumothorax. Cardiomegaly persists with edema and or pneumonia bilaterally.  IMPRESSION: Left subclavian central line well positioned  with its tip in the SVC 3 cm above the right atrium. No pneumothorax.  Cardiomegaly and pulmonary edema and or bilateral pneumonia.   Electronically Signed   By: Paulina Fusi M.D.   On: 10/14/2013 13:27   Dg Chest Port 1 View  10/13/2013   CLINICAL DATA:  Shortness of breath  EXAM: PORTABLE CHEST - 1 VIEW  COMPARISON:  10/07/2013  FINDINGS: Cardiac shadow remains enlarged. Increased vascular congestion and mild pulmonary edema is noted. No focal confluent infiltrate is seen. No bony abnormality is noted.  IMPRESSION: Changes consistent with CHF.   Electronically Signed   By: Alcide Clever M.D.   On: 10/13/2013 16:43    Scheduled Meds: . albuterol  2.5 mg Nebulization Q6H  . allopurinol  100 mg Oral Daily  . amiodarone  200 mg Oral Daily  . amLODipine  10 mg Oral Daily  . aspirin  325 mg Oral Daily  . atorvastatin  20 mg Oral QHS  . docusate sodium  100 mg Oral BID  . enoxaparin (LOVENOX) injection  40 mg Subcutaneous Q24H  . ferrous sulfate  325 mg Oral TID WC  . furosemide  40 mg Intravenous Q12H  . hydrALAZINE  50 mg Oral TID  . hydrocerin   Topical TID  . insulin aspart  0-15 Units Subcutaneous TID WC  . insulin glargine  19 Units Subcutaneous BID  . levofloxacin  750 mg Oral Q48H  . metoCLOPramide  5 mg Oral TID AC  . metoprolol tartrate  12.5 mg Oral BID  . pantoprazole  40 mg Oral Daily  . polyethylene glycol  17 g Oral Daily  . potassium chloride  20 mEq Oral Daily  . sodium chloride  10-40 mL Intracatheter Q12H  . traZODone  25 mg Oral QHS   Continuous Infusions:   Time spent: 25 minutes.     LOS: 6 days   Dhruva Orndoff  [facility] Pager [phone].   *Please note that the hospitalists switch teams on Wednesdays. Please call the flow manager at [phone] if you are  having difficulty reaching the hospitalist taking care of this patient as she can update you and provide the most up-to-date pager number of provider caring for the patient. If 8PM-8AM, please  contact night-coverage at [email or web address], password [facility]  11/15/2013, 4:11 PM

## 2013-11-15 NOTE — Progress Notes (Signed)
Physical Therapy Treatment Patient Details Name: FLORIE CARICO MRN: [id number] DOB: 1951-02-18 Today's Date: 11/15/2013 Time: 1315-1340 PT Time Calculation (min): 25 min  PT Assessment / Plan / Recommendation  History of Present Illness 62 yo female admitted with acute resp failure from SNF. Hx of HTN, DM Afib, obesity, lymphedema   PT Comments   Assited pt out of recliner to [facility] for a BM then amb in hallway limited distance due to 3/4 DOE on 6 lts O2.    Follow Up Recommendations  SNF [facility] Care)     Does the patient have the potential to tolerate intense rehabilitation     Barriers to Discharge        Equipment Recommendations       Recommendations for Other Services    Frequency Min 3X/week   Progress towards PT Goals Progress towards PT goals: Progressing toward goals  Plan      Precautions / Restrictions Precautions Precautions: Fall Precaution Comments: decreased activity tolerance. O2 dep Restrictions Weight Bearing Restrictions: No    Pertinent Vitals/Pain No c/o pain    Mobility  Bed Mobility Bed Mobility: Not assessed Details for Bed Mobility Assistance: Pt OOB in recliner Transfers Transfers: Sit to Stand;Stand to Sit;Stand Pivot Transfers Sit to Stand: 4: Min assist;From chair/3-in-1;From toilet Stand to Sit: 4: Min assist;To toilet;To chair/3-in-1 Stand Pivot Transfers: 4: Min assist Details for Transfer Assistance: Assisted pt from recliner to [facility] for a BM then to standing for amb.  Pt required increased time and one VC on safety with turns. Ambulation/Gait Ambulation/Gait Assistance: 1: +2 Total assist Ambulation Distance (Feet): 6 Feet Assistive device: Rolling walker Ambulation/Gait Assistance Details: On 6 lts O2, very limited activity tolerance and demon 3/4 DOE.  Recliner following for safety.  Gait Pattern: Step-to pattern;Decreased stride length;Decreased stance time - left;Antalgic;Trunk flexed Gait velocity: decreased      PT Goals (current goals can now be found in the care plan section)    Visit Information  Last PT Received On: 11/15/13 Assistance Needed: +2 History of Present Illness: 62 yo female admitted with acute resp failure from SNF. Hx of HTN, DM Afib, obesity, lymphedema    Subjective Data      Cognition       Balance     End of Session PT - End of Session Equipment Utilized During Treatment: Oxygen;Gait belt Activity Tolerance: Patient limited by fatigue Patient left: in chair;with call bell/phone within reach   Felecia Shelling  PTA [facility]  Acute  Rehab Pager      [phone]

## 2013-11-16 DIAGNOSIS — G4733 Obstructive sleep apnea (adult) (pediatric): Secondary | ICD-10-CM

## 2013-11-16 LAB — GLUCOSE, CAPILLARY
Glucose-Capillary: 128 mg/dL — ABNORMAL HIGH (ref 70–99)
Glucose-Capillary: 93 mg/dL (ref 70–99)

## 2013-11-16 MED ORDER — OXYCODONE-ACETAMINOPHEN 5-325 MG PO TABS: 1.0000 | ORAL_TABLET | Freq: Four times a day (QID) | ORAL | Status: DC | PRN

## 2013-11-16 MED ORDER — ALTEPLASE 2 MG IJ SOLR
2.0000 mg | Freq: Once | INTRAMUSCULAR | Status: DC
  Filled 2013-11-16: qty 2

## 2013-11-16 MED ORDER — LORAZEPAM 1 MG PO TABS
1.0000 mg | ORAL_TABLET | Freq: Once | ORAL | Status: AC
  Administered 2013-11-16: 1 mg via ORAL
  Filled 2013-11-16: qty 1

## 2013-11-16 MED ORDER — INSULIN GLARGINE 100 UNIT/ML ~~LOC~~ SOLN: 15.0000 [IU] | Freq: Two times a day (BID) | SUBCUTANEOUS | Status: DC

## 2013-11-16 NOTE — Discharge Summary (Addendum)
Physician Discharge Summary  [name] WUJ:[id number] DOB: 04/03/1951 DOA: 11/09/2013  PCP: Willey Blade, MD  Admit date: 11/09/2013 Discharge date: 11/16/2013  Recommendations for Outpatient Follow-up:  1. Please refer to AVS for wound care instructions. 2. Recommend close attention to daily weights and I&O balance with early adjustment of diuretics to prevent recurrent admissions for decompensated heart failure. 3. Recommend close follow up of BMET in next 2-3 days. 4. Recommend close follow up with cardiologist/heart failure clinic (should be seen in 1-2 weeks).  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  Diet recommendation: Low-sodium, heart healthy.  History of present illness:  [name] is an 62 y.o. female with 6 hospitalizations in the past 6 months and a past medical history of atrial fibrillation, morbid obesity, obstructive sleep apnea, chronic systolic and diastolic heart failure, chronic kidney disease, diabetes, COPD/asthma with most recent hospitalization from 10/13/2013-10/21/2013 for treatment of symptomatic anemia and decompensated heart failure, who was admitted 11/09/2013 with shortness of breath. Chest x-ray done on admission showed possible bilateral pneumonia versus interstitial anemia and her admission pro BNP was greater than 3000.  discharge but her insurance has not cleared her to return to her SNF.  Hospital Course by problem:  Principal Problem:  Acute respiratory failure  Appears to be multifactorial with chronic systolic/diastolic heart  failure, obstructive sleep apnea/obesity hypoventilation syndrome, and possible healthcare associated pneumonia all potentially contributory. She continues to refuse CPAP. Had hypoxic event overnight on 11/12/2013 necessitating higher flow oxygen. Continue albuterol. Recommend close attention to I and O. balance and daily weights at her skilled facility with early adjustment of diuretic therapy if indicated to prevent heart failure readmission. Active Problems:  Nausea  Trial Reglan, ? Gastroparesis.  Constipation  Bowels moved after Fleet enema 11/12/13.  Acute combined systolic and diastolic heart failure  Elevated proBNP and chest radiograph consistent with pulmonary edema. Lasix has been increased to 40 mg 3 times a day (home dose 40 mg twice a day). I and O. balance -1.0 L, weight variable. Last echocardiogram done 11/14 with an EF of 50-55% and grade 1 diastolic dysfunction.  History of CVA / STROKE  Continue risk factor modification with control of blood pressure, lipids, and diabetes.  DM (diabetes mellitus)  Last hemoglobin A1c 5.4% on 10/13/2013. Currently on Lantus 19 units twice a day and moderate scale SSI. Will decrease Lantus to 15 units twice a day given hypoglycemia on the morning of discharge.  CKD (chronic kidney disease), stage IV  Baseline creatinine 2-2.1. Current creatinine slightly higher, likely from diuretic effects. May have to tolerate a slightly higher creatinine to keep out of CHF. Anemia of chronic disease  Baseline hemoglobin around 8 mg/dL. Current hemoglobin 7.2 mg/dL. No current indication for transfusion.  PAF (paroxysmal atrial fibrillation)  Rate controlled on amiodarone. Not currently a candidate for anticoagulation given history of hematoma. Currently in NSR with HR in 60's (converted from atrial fibrillation at 11:12 10/11/2013).   Procedures:  None.  Consultations:  None.  Discharge Exam: [name]  Vitals:   11/16/13 0427  BP: 169/55  Pulse: 51   Temp: 98.1 F (36.7 C)  Resp: 20   Filed Vitals:   11/15/13 2203 11/16/13 0120 11/16/13 0427 11/16/13 0746  BP: 165/47  169/55   Pulse: 60  51   Temp: 98.3 F (36.8 C)  98.1 F (36.7 C)   TempSrc: Oral  Oral   Resp: 20  20   Height:      Weight:   126.5 kg (278 lb 14.1 oz)   SpO2: 98% 93% 94% 94%    Gen:  NAD Cardiovascular:  RRR, No M/R/G Respiratory: Lungs diminished Gastrointestinal: Abdomen soft, NT/ND with normal active bowel sounds. Extremities: 3+ edema with a skin graft wound to right upper thigh and 2 small ulcers the right lower extremity   Discharge Instructions      Discharge Orders   Future Appointments Provider Department Dept Phone   01/06/2014 1:45 PM Randall Hiss, MD [facility] [phone]   Future Orders Complete By Expires   (HEART FAILURE PATIENTS) Call MD:  Anytime you have any of the following symptoms: 1) 3 pound weight gain in 24 hours or 5 pounds in 1 week 2) shortness of breath, with or without a dry hacking cough 3) swelling in the hands, feet or stomach 4) if you have to sleep on extra pillows at night in order to breathe.  As directed    Call MD for:  temperature >100.4  As directed    Diet - low sodium heart healthy  As directed    Diet Carb Modified  As directed    Discharge instructions  As directed    Comments:     You were cared for by Dr. Hillery Aldo  (a hospitalist) during your hospital stay. If you have any questions about your discharge medications or the care you received while you were in the hospital after you are discharged, you can call the unit and ask to speak with the hospitalist on call if the hospitalist that took care of you is not available. Once you are discharged, your primary care physician will handle any further medical issues. Please note that NO REFILLS for any discharge medications will be authorized once you are discharged, as it is imperative that you return to your  primary care physician (or establish a relationship with a primary care physician if you do not have one) for your aftercare needs so that they can reassess your need for medications and monitor your lab values.  Any outstanding tests can be reviewed by your PCP at your follow up visit.  It is also important to review any medicine changes with your PCP.  Please bring these d/c instructions with you to your next visit so your physician can review these changes with you.  If you do not have a primary care physician, you can call [phone] for a physician referral.  It is highly recommended that you obtain a PCP for hospital follow up.   Discharge wound care:  As directed    Comments:     Silver hydrofiber for chronic areas of the medial RLE.  Cut to fit and place over the open areas, change every other day.  Clean skin graft with saline, apply adaptic and wrap with kerlix daily.   Increase activity slowly  As directed        Medication List    STOP taking these medications  levofloxacin 250 MG tablet  Commonly known as:  LEVAQUIN      TAKE these medications       albuterol 108 (90 BASE) MCG/ACT inhaler  Commonly known as:  PROVENTIL HFA;VENTOLIN HFA  Inhale 2 puffs into the lungs every 6 (six) hours as needed for wheezing or shortness of breath.     allopurinol 100 MG tablet  Commonly known as:  ZYLOPRIM  Take 100 mg by mouth daily. Patient takes at 0800am daily     amiodarone 200 MG tablet  Commonly known as:  PACERONE  Take 200 mg by mouth daily. Patient takes at 0800am daily     amLODipine 10 MG tablet  Commonly known as:  NORVASC  Take 10 mg by mouth daily. Patient takes at 0800am daily     aspirin 325 MG tablet  Take 325 mg by mouth daily.     atorvastatin 20 MG tablet  Commonly known as:  LIPITOR  Take 20 mg by mouth at bedtime. Patient takes at 2100     diphenhydrAMINE 25 mg capsule  Commonly known as:  BENADRYL  Take 1 capsule (25 mg total) by mouth every 4  (four) hours as needed for itching.     diphenhydrAMINE-zinc acetate cream  Commonly known as:  BENADRYL  Apply topically 3 (three) times daily as needed for itching.     docusate sodium 100 MG capsule  Commonly known as:  COLACE  Take 100 mg by mouth 2 (two) times daily.     ferrous sulfate 325 (65 FE) MG tablet  Take 325 mg by mouth 3 (three) times daily with meals. Patient takes at 0800, 1200, and 1700     furosemide 40 MG tablet  Commonly known as:  LASIX  Take 40 mg by mouth 2 (two) times daily. Patient takes at 0800am daily     hydrALAZINE 50 MG tablet  Commonly known as:  APRESOLINE  Take 50 mg by mouth 3 (three) times daily.     insulin aspart 100 UNIT/ML injection  Commonly known as:  novoLOG  Inject 0-15 Units into the skin 3 (three) times daily with meals.     insulin glargine 100 UNIT/ML injection  Commonly known as:  LANTUS  Inject 0.15 mLs (15 Units total) into the skin 2 (two) times daily.     metoCLOPramide 5 MG tablet  Commonly known as:  REGLAN  Take 1 tablet (5 mg total) by mouth 3 (three) times daily before meals.     metoprolol tartrate 12.5 mg Tabs tablet  Commonly known as:  LOPRESSOR  Take 0.5 tablets (12.5 mg total) by mouth 2 (two) times daily.     omeprazole 20 MG capsule  Commonly known as:  PRILOSEC  Take 20 mg by mouth daily.     ondansetron 4 MG tablet  Commonly known as:  ZOFRAN  Take 1 tablet (4 mg total) by mouth every 6 (six) hours as needed for nausea.     oxyCODONE-acetaminophen 5-325 MG per tablet  Commonly known as:  ROXICET  Take 1-2 tablets by mouth every 6 (six) hours as needed for moderate pain or severe pain.     pantoprazole 40 MG tablet  Commonly known as:  PROTONIX  Take 40 mg by mouth daily.     polyethylene glycol packet  Commonly known as:  MIRALAX / GLYCOLAX  Take 17 g by mouth 2 (two) times daily.     Potassium Chloride ER 20 MEQ Tbcr  Take 20 mEq  by mouth daily.     traZODone 50 MG tablet  Commonly known  as:  DESYREL  Take 25 mg by mouth at bedtime.          The results of significant diagnostics from this hospitalization (including imaging, microbiology, ancillary and laboratory) are listed below for reference.    Significant Diagnostic Studies: Dg Chest Port 1 View  11/10/2013   CLINICAL DATA:  Status post central line placement.  EXAM: PORTABLE CHEST - 1 VIEW  COMPARISON:  Chest x-ray ofDecember 2, 2014.  FINDINGS: The lungs are adequately inflated. Confluent alveolar infiltrates are present bilaterally. These have not greatly changed since the study of earlier today. The cardiopericardial silhouette remains enlarged and the left hemidiaphragm remains obscured.  The right internal jugular venous catheter tip lies in the region of the proximal to mid SVC. There is no evidence of a procedure complication.  IMPRESSION: 1. There is no evidence of a postprocedure complication following placement of a right internal jugular venous catheter. 2. Widespread interstitial and alveolar densities are consistent with congestive heart failure and pulmonary edema.   Electronically Signed   By: David  [place]   On: 11/10/2013 12:40   Dg Chest Port 1 View  11/09/2013   CLINICAL DATA:  Shortness of Breath  EXAM: PORTABLE CHEST - 1 VIEW  COMPARISON:  10/14/2013  FINDINGS: Cardiomegaly again noted. Bilateral hazy airspace disease which may be due to interstitial edema or bilateral pneumonia.  IMPRESSION: Bilateral airspace disease which may be due to bilateral pneumonia or bilateral pulmonary edema. Cardiomegaly again noted.   Electronically Signed   By: Natasha Mead M.D.   On: 11/09/2013 15:28    Labs:  Basic Metabolic Panel:  Recent Labs Lab 11/11/13 0420 11/12/13 0445 11/13/13 0620 11/14/13 0358 11/15/13 0415  NA 138 137 136 135 136  K 4.9 4.8 4.9 4.5 4.5  CL 102 100 100 99 100  CO2 29 30 31 31 31   GLUCOSE 91 80 69* 110* 88  BUN 42* 47* 49* 54* 50*  CREATININE 2.44* 2.56* 2.60* 2.61* 2.49*  CALCIUM  8.4 8.2* 8.4 8.2* 8.4   GFR Estimated Creatinine Clearance: 29.9 ml/min (by C-G formula based on Cr of 2.49). Liver Function Tests:  Recent Labs Lab 11/09/13 1605  AST 9  ALT 8  ALKPHOS 115  BILITOT 0.3  PROT 7.4  ALBUMIN 2.6*   CBC:  Recent Labs Lab 11/09/13 1605 11/10/13 0424 11/11/13 0420 11/14/13 0358 11/15/13 0415  WBC 10.0 8.7 7.9 6.8 7.4  NEUTROABS 8.8*  --   --   --   --   HGB 8.0* 7.3* 7.4* 7.2* 7.2*  HCT 26.7* 24.3* 25.2* 24.7* 24.1*  MCV 87.5 88.7 89.0 88.5 88.0  PLT 225 196 188 217 205   Cardiac Enzymes:  Recent Labs Lab 11/09/13 1605  TROPONINI <0.30   CBG:  Recent Labs Lab 11/15/13 1649 11/15/13 2205 11/16/13 0735 11/16/13 0811 11/16/13 1139  GLUCAP 92 117* 64* 93 128*   Microbiology Recent Results (from the past 240 hour(s))  CULTURE, BLOOD (ROUTINE X 2)     Status: None   Collection Time    11/09/13  4:05 PM      Result Value Range Status   Specimen Description BLOOD LEFT ARM   Final   Special Requests BOTTLES DRAWN AEROBIC AND ANAEROBIC   Final   Culture  Setup Time     Final   Value: 11/09/2013 20:31     Performed at [facility]   Culture     Final   Value: NO GROWTH 5 DAYS     Performed at [facility]   Report Status 11/15/2013 FINAL   Final  CULTURE, BLOOD (ROUTINE X 2)     Status: None   Collection Time    11/09/13  4:10 PM      Result Value Range Status   Specimen Description BLOOD LEFT ANTECUBITAL   Final   Special Requests BOTTLES DRAWN AEROBIC AND ANAEROBIC   Final   Culture  Setup Time     Final   Value: 11/09/2013 20:30     Performed at [facility]   Culture     Final   Value: STAPHYLOCOCCUS SPECIES (COAGULASE NEGATIVE)     Note: THE SIGNIFICANCE OF ISOLATING THIS ORGANISM FROM A SINGLE SET OF BLOOD CULTURES WHEN MULTIPLE SETS ARE DRAWN IS UNCERTAIN. PLEASE NOTIFY THE MICROBIOLOGY DEPARTMENT WITHIN ONE WEEK IF SPECIATION AND SENSITIVITIES ARE REQUIRED.     Note: Gram Stain Report  Called to,Read Back By and Verified With: ALLIE S @ 1508 11/10/13 BY KRAWS     Performed at [facility]   Report Status 11/11/2013 FINAL   Final  URINE CULTURE     Status: None   Collection Time    11/09/13  7:42 PM      Result Value Range Status   Specimen Description URINE, CLEAN CATCH   Final   Special Requests NONE   Final   Culture  Setup Time     Final   Value: 11/10/2013 02:46     Performed at [facility] Count     Final   Value: >=100,000 COLONIES/ML     Performed at [facility]   Culture     Final   Value: ESCHERICHIA COLI     Performed at [facility]   Report Status 11/12/2013 FINAL   Final   Organism ID, Bacteria ESCHERICHIA COLI   Final    Time coordinating discharge: 35 minutes.  Signed:  Joselyn Edling  Pager [phone] [facility] 11/16/2013, 2:07 PM

## 2013-11-16 NOTE — Progress Notes (Signed)
Inpatient Diabetes Program Recommendations  AACE/ADA: New Consensus Statement on Inpatient Glycemic Control (2013)  Target Ranges:  Prepandial:   less than 140 mg/dL      Peak postprandial:   less than 180 mg/dL (1-2 hours)      Critically ill patients:  140 - 180 mg/dL   Reason for Visit: Results for EDITH, GROLEAU (MRN [id number]) as of 11/16/2013 09:45  Ref. Range 11/15/2013 11:43 11/15/2013 16:49 11/15/2013 22:05 11/16/2013 07:35 11/16/2013 08:11  Glucose-Capillary Latest Range: 70-99 mg/dL 93 92 782 (H) 64 (L) 93   Note fasting CBG was 64 mg/dL this morning.  Consider further reduction of Lantus to 15 units bid and decrease correction to sensitive.  Thanks, Beryl Meager, RN, BC-ADM Inpatient Diabetes Coordinator Pager [phone]

## 2013-11-16 NOTE — Progress Notes (Signed)
Hypoglycemic Event  CBG: 64  Treatment: 15 GM carbohydrate snack  Symptoms: None  Follow-up CBG: Time:0811 CBG Result:93  Possible Reasons for Event: Unknown  Comments/MD notified:protocol    Randa Evens, Shirlee Latch  Remember to initiate Hypoglycemia Order Set & complete

## 2013-11-16 NOTE — Progress Notes (Signed)
Patient is set to discharge back to [facility] today. Patient & husband at bedside aware. Discharge packet in [place]. [facility] called for transport.   Unice Bailey, LCSW [facility] Clinical Social Worker cell #: [phone]

## 2013-11-17 ENCOUNTER — Encounter (HOSPITAL_COMMUNITY): Payer: Self-pay | Admitting: Emergency Medicine

## 2013-11-17 ENCOUNTER — Emergency Department (HOSPITAL_COMMUNITY): Payer: Medicare HMO

## 2013-11-17 ENCOUNTER — Inpatient Hospital Stay (HOSPITAL_COMMUNITY)
Admission: EM | Admit: 2013-11-17 | Discharge: 2013-11-22 | DRG: 291 | Disposition: A | Discharge status: Skilled Nursing Facility | Payer: Medicare HMO | Attending: Internal Medicine | Admitting: Internal Medicine

## 2013-11-17 DIAGNOSIS — Z7982 Long term (current) use of aspirin: Secondary | ICD-10-CM

## 2013-11-17 DIAGNOSIS — I2510 Atherosclerotic heart disease of native coronary artery without angina pectoris: Secondary | ICD-10-CM | POA: Diagnosis present

## 2013-11-17 DIAGNOSIS — E662 Morbid (severe) obesity with alveolar hypoventilation: Secondary | ICD-10-CM | POA: Diagnosis present

## 2013-11-17 DIAGNOSIS — Z9981 Dependence on supplemental oxygen: Secondary | ICD-10-CM

## 2013-11-17 DIAGNOSIS — Z9119 Patient's noncompliance with other medical treatment and regimen: Secondary | ICD-10-CM

## 2013-11-17 DIAGNOSIS — J9600 Acute respiratory failure, unspecified whether with hypoxia or hypercapnia: Secondary | ICD-10-CM | POA: Diagnosis present

## 2013-11-17 DIAGNOSIS — N184 Chronic kidney disease, stage 4 (severe): Secondary | ICD-10-CM | POA: Diagnosis present

## 2013-11-17 DIAGNOSIS — I5041 Acute combined systolic (congestive) and diastolic (congestive) heart failure: Secondary | ICD-10-CM | POA: Diagnosis present

## 2013-11-17 DIAGNOSIS — Z794 Long term (current) use of insulin: Secondary | ICD-10-CM

## 2013-11-17 DIAGNOSIS — M109 Gout, unspecified: Secondary | ICD-10-CM

## 2013-11-17 DIAGNOSIS — S81801S Unspecified open wound, right lower leg, sequela: Secondary | ICD-10-CM

## 2013-11-17 DIAGNOSIS — T888XXS Other specified complications of surgical and medical care, not elsewhere classified, sequela: Secondary | ICD-10-CM

## 2013-11-17 DIAGNOSIS — K219 Gastro-esophageal reflux disease without esophagitis: Secondary | ICD-10-CM | POA: Diagnosis present

## 2013-11-17 DIAGNOSIS — I4891 Unspecified atrial fibrillation: Secondary | ICD-10-CM

## 2013-11-17 DIAGNOSIS — R0602 Shortness of breath: Secondary | ICD-10-CM

## 2013-11-17 DIAGNOSIS — R0902 Hypoxemia: Secondary | ICD-10-CM

## 2013-11-17 DIAGNOSIS — R110 Nausea: Secondary | ICD-10-CM

## 2013-11-17 DIAGNOSIS — G4733 Obstructive sleep apnea (adult) (pediatric): Secondary | ICD-10-CM | POA: Diagnosis present

## 2013-11-17 DIAGNOSIS — I129 Hypertensive chronic kidney disease with stage 1 through stage 4 chronic kidney disease, or unspecified chronic kidney disease: Secondary | ICD-10-CM | POA: Diagnosis present

## 2013-11-17 DIAGNOSIS — D638 Anemia in other chronic diseases classified elsewhere: Secondary | ICD-10-CM

## 2013-11-17 DIAGNOSIS — L409 Psoriasis, unspecified: Secondary | ICD-10-CM

## 2013-11-17 DIAGNOSIS — Z87891 Personal history of nicotine dependence: Secondary | ICD-10-CM

## 2013-11-17 DIAGNOSIS — R233 Spontaneous ecchymoses: Secondary | ICD-10-CM

## 2013-11-17 DIAGNOSIS — J9620 Acute and chronic respiratory failure, unspecified whether with hypoxia or hypercapnia: Secondary | ICD-10-CM | POA: Diagnosis present

## 2013-11-17 DIAGNOSIS — Z6841 Body Mass Index (BMI) 40.0 and over, adult: Secondary | ICD-10-CM

## 2013-11-17 DIAGNOSIS — I480 Paroxysmal atrial fibrillation: Secondary | ICD-10-CM | POA: Diagnosis present

## 2013-11-17 DIAGNOSIS — D649 Anemia, unspecified: Secondary | ICD-10-CM | POA: Diagnosis present

## 2013-11-17 DIAGNOSIS — Z8673 Personal history of transient ischemic attack (TIA), and cerebral infarction without residual deficits: Secondary | ICD-10-CM

## 2013-11-17 DIAGNOSIS — N058 Unspecified nephritic syndrome with other morphologic changes: Secondary | ICD-10-CM | POA: Diagnosis present

## 2013-11-17 DIAGNOSIS — E669 Obesity, unspecified: Secondary | ICD-10-CM | POA: Insufficient documentation

## 2013-11-17 DIAGNOSIS — I6789 Other cerebrovascular disease: Secondary | ICD-10-CM

## 2013-11-17 DIAGNOSIS — K5900 Constipation, unspecified: Secondary | ICD-10-CM

## 2013-11-17 DIAGNOSIS — T8459XS Infection and inflammatory reaction due to other internal joint prosthesis, sequela: Secondary | ICD-10-CM

## 2013-11-17 DIAGNOSIS — Z7901 Long term (current) use of anticoagulants: Secondary | ICD-10-CM

## 2013-11-17 DIAGNOSIS — Z91199 Patient's noncompliance with other medical treatment and regimen due to unspecified reason: Secondary | ICD-10-CM

## 2013-11-17 DIAGNOSIS — I509 Heart failure, unspecified: Secondary | ICD-10-CM

## 2013-11-17 DIAGNOSIS — I5042 Chronic combined systolic (congestive) and diastolic (congestive) heart failure: Secondary | ICD-10-CM

## 2013-11-17 DIAGNOSIS — I5043 Acute on chronic combined systolic (congestive) and diastolic (congestive) heart failure: Principal | ICD-10-CM | POA: Diagnosis present

## 2013-11-17 DIAGNOSIS — E785 Hyperlipidemia, unspecified: Secondary | ICD-10-CM | POA: Diagnosis present

## 2013-11-17 DIAGNOSIS — E1129 Type 2 diabetes mellitus with other diabetic kidney complication: Secondary | ICD-10-CM | POA: Diagnosis present

## 2013-11-17 DIAGNOSIS — Z96659 Presence of unspecified artificial knee joint: Secondary | ICD-10-CM

## 2013-11-17 DIAGNOSIS — E1165 Type 2 diabetes mellitus with hyperglycemia: Secondary | ICD-10-CM | POA: Diagnosis present

## 2013-11-17 DIAGNOSIS — K625 Hemorrhage of anus and rectum: Secondary | ICD-10-CM

## 2013-11-17 DIAGNOSIS — I10 Essential (primary) hypertension: Secondary | ICD-10-CM | POA: Diagnosis present

## 2013-11-17 DIAGNOSIS — S81801A Unspecified open wound, right lower leg, initial encounter: Secondary | ICD-10-CM

## 2013-11-17 LAB — URINALYSIS, ROUTINE W REFLEX MICROSCOPIC
Bilirubin Urine: NEGATIVE
Ketones, ur: NEGATIVE mg/dL
Leukocytes, UA: NEGATIVE
Nitrite: NEGATIVE
Protein, ur: 100 mg/dL — AB
Urobilinogen, UA: 0.2 mg/dL (ref 0.0–1.0)
pH: 5 (ref 5.0–8.0)

## 2013-11-17 LAB — MRSA PCR SCREENING: MRSA by PCR: NEGATIVE

## 2013-11-17 LAB — CBC WITH DIFFERENTIAL/PLATELET
Basophils Absolute: 0.1 10*3/uL (ref 0.0–0.1)
Basophils Absolute: 0 10*3/uL (ref 0.0–0.1)
Basophils Relative: 0 % (ref 0–1)
Eosinophils Absolute: 0.3 10*3/uL (ref 0.0–0.7)
HCT: 30.4 % — ABNORMAL LOW (ref 36.0–46.0)
Hemoglobin: 8.8 g/dL — ABNORMAL LOW (ref 12.0–15.0)
Lymphocytes Relative: 10 % — ABNORMAL LOW (ref 12–46)
Lymphocytes Relative: 10 % — ABNORMAL LOW (ref 12–46)
Lymphs Abs: 1.4 10*3/uL (ref 0.7–4.0)
MCH: 27 pg (ref 26.0–34.0)
MCHC: 30.2 g/dL (ref 30.0–36.0)
Monocytes Absolute: 0.6 10*3/uL (ref 0.1–1.0)
Monocytes Absolute: 0.7 10*3/uL (ref 0.1–1.0)
Monocytes Relative: 5 % (ref 3–12)
Neutro Abs: 12.1 10*3/uL — ABNORMAL HIGH (ref 1.7–7.7)
Neutro Abs: 12.3 10*3/uL — ABNORMAL HIGH (ref 1.7–7.7)
Neutrophils Relative %: 84 % — ABNORMAL HIGH (ref 43–77)
Neutrophils Relative %: 83 % — ABNORMAL HIGH (ref 43–77)
Platelets: 324 10*3/uL (ref 150–400)
RBC: 3.3 MIL/uL — ABNORMAL LOW (ref 3.87–5.11)
RDW: 17.2 % — ABNORMAL HIGH (ref 11.5–15.5)
RDW: 17.5 % — ABNORMAL HIGH (ref 11.5–15.5)
WBC: 14.5 10*3/uL — ABNORMAL HIGH (ref 4.0–10.5)
WBC: 14.8 10*3/uL — ABNORMAL HIGH (ref 4.0–10.5)

## 2013-11-17 LAB — COMPREHENSIVE METABOLIC PANEL
ALT: 11 U/L (ref 0–35)
AST: 26 U/L (ref 0–37)
Albumin: 2.8 g/dL — ABNORMAL LOW (ref 3.5–5.2)
Alkaline Phosphatase: 101 U/L (ref 39–117)
BUN: 42 mg/dL — ABNORMAL HIGH (ref 6–23)
Chloride: 101 mEq/L (ref 96–112)
Potassium: 4.6 mEq/L (ref 3.5–5.1)
Sodium: 140 mEq/L (ref 135–145)
Total Bilirubin: 0.2 mg/dL — ABNORMAL LOW (ref 0.3–1.2)
Total Protein: 7.9 g/dL (ref 6.0–8.3)

## 2013-11-17 LAB — POCT I-STAT 3, ART BLOOD GAS (G3+)
Bicarbonate: 27.8 mEq/L — ABNORMAL HIGH (ref 20.0–24.0)
TCO2: 30 mmol/L (ref 0–100)
pCO2 arterial: 58.2 mmHg (ref 35.0–45.0)
pH, Arterial: 7.287 — ABNORMAL LOW (ref 7.350–7.450)

## 2013-11-17 LAB — URINE MICROSCOPIC-ADD ON

## 2013-11-17 LAB — LACTIC ACID, PLASMA: Lactic Acid, Venous: 3.4 mmol/L — ABNORMAL HIGH (ref 0.5–2.2)

## 2013-11-17 LAB — T4, FREE: Free T4: 0.9 ng/dL (ref 0.80–1.80)

## 2013-11-17 LAB — GLUCOSE, CAPILLARY: Glucose-Capillary: 88 mg/dL (ref 70–99)

## 2013-11-17 LAB — IRON AND TIBC
Iron: 35 ug/dL — ABNORMAL LOW (ref 42–135)
TIBC: 210 ug/dL — ABNORMAL LOW (ref 250–470)
UIBC: 175 ug/dL (ref 125–400)

## 2013-11-17 LAB — PROTIME-INR
INR: 1.03 (ref 0.00–1.49)
Prothrombin Time: 13.3 seconds (ref 11.6–15.2)

## 2013-11-17 LAB — TSH: TSH: 7.323 u[IU]/mL — ABNORMAL HIGH (ref 0.350–4.500)

## 2013-11-17 LAB — VITAMIN B12: Vitamin B-12: 230 pg/mL (ref 211–911)

## 2013-11-17 MED ORDER — AMLODIPINE BESYLATE 10 MG PO TABS
10.0000 mg | ORAL_TABLET | Freq: Every day | ORAL | Status: DC
  Administered 2013-11-18 – 2013-11-22 (×5): 10 mg via ORAL
  Filled 2013-11-17 (×7): qty 1

## 2013-11-17 MED ORDER — ONDANSETRON HCL 4 MG PO TABS: 4.0000 mg | ORAL_TABLET | Freq: Four times a day (QID) | ORAL | Status: DC | PRN

## 2013-11-17 MED ORDER — METOCLOPRAMIDE HCL 5 MG PO TABS
5.0000 mg | ORAL_TABLET | Freq: Three times a day (TID) | ORAL | Status: DC
  Administered 2013-11-17 – 2013-11-22 (×16): 5 mg via ORAL
  Filled 2013-11-17 (×17): qty 1

## 2013-11-17 MED ORDER — SODIUM CHLORIDE 0.9 % IJ SOLN: 3.0000 mL | INTRAMUSCULAR | Status: DC | PRN

## 2013-11-17 MED ORDER — ASPIRIN 325 MG PO TABS
325.0000 mg | ORAL_TABLET | Freq: Every day | ORAL | Status: DC
  Administered 2013-11-18 – 2013-11-22 (×5): 325 mg via ORAL
  Filled 2013-11-17 (×5): qty 1

## 2013-11-17 MED ORDER — ACETAMINOPHEN 325 MG PO TABS: 650.0000 mg | ORAL_TABLET | Freq: Four times a day (QID) | ORAL | Status: DC | PRN

## 2013-11-17 MED ORDER — ALLOPURINOL 100 MG PO TABS
100.0000 mg | ORAL_TABLET | Freq: Every day | ORAL | Status: DC
  Administered 2013-11-18 – 2013-11-22 (×5): 100 mg via ORAL
  Filled 2013-11-17 (×6): qty 1

## 2013-11-17 MED ORDER — PANTOPRAZOLE SODIUM 40 MG PO TBEC
40.0000 mg | DELAYED_RELEASE_TABLET | Freq: Every day | ORAL | Status: DC
  Administered 2013-11-18 – 2013-11-22 (×5): 40 mg via ORAL
  Filled 2013-11-17 (×4): qty 1

## 2013-11-17 MED ORDER — SENNA 8.6 MG PO TABS
2.0000 | ORAL_TABLET | Freq: Every day | ORAL | Status: DC
  Administered 2013-11-17 – 2013-11-22 (×5): 17.2 mg via ORAL
  Filled 2013-11-17 (×6): qty 2

## 2013-11-17 MED ORDER — FERROUS SULFATE 325 (65 FE) MG PO TABS
325.0000 mg | ORAL_TABLET | Freq: Three times a day (TID) | ORAL | Status: DC
  Administered 2013-11-17 – 2013-11-22 (×16): 325 mg via ORAL
  Filled 2013-11-17 (×17): qty 1

## 2013-11-17 MED ORDER — HYDRALAZINE HCL 50 MG PO TABS
50.0000 mg | ORAL_TABLET | Freq: Three times a day (TID) | ORAL | Status: DC
  Administered 2013-11-17 – 2013-11-18 (×3): 50 mg via ORAL
  Filled 2013-11-17 (×5): qty 1

## 2013-11-17 MED ORDER — SODIUM CHLORIDE 0.9 % IV SOLN: 250.0000 mL | INTRAVENOUS | Status: DC | PRN

## 2013-11-17 MED ORDER — POTASSIUM CHLORIDE ER 10 MEQ PO TBCR
20.0000 meq | EXTENDED_RELEASE_TABLET | Freq: Every day | ORAL | Status: DC
  Administered 2013-11-17 – 2013-11-21 (×5): 20 meq via ORAL
  Filled 2013-11-17 (×7): qty 2

## 2013-11-17 MED ORDER — AMIODARONE HCL 200 MG PO TABS
200.0000 mg | ORAL_TABLET | Freq: Every day | ORAL | Status: DC
  Administered 2013-11-18 – 2013-11-22 (×5): 200 mg via ORAL
  Filled 2013-11-17 (×6): qty 1

## 2013-11-17 MED ORDER — HEPARIN SODIUM (PORCINE) 5000 UNIT/ML IJ SOLN
5000.0000 [IU] | Freq: Three times a day (TID) | INTRAMUSCULAR | Status: DC
  Administered 2013-11-17 – 2013-11-22 (×11): 5000 [IU] via SUBCUTANEOUS
  Filled 2013-11-17 (×19): qty 1

## 2013-11-17 MED ORDER — INSULIN ASPART 100 UNIT/ML ~~LOC~~ SOLN
0.0000 [IU] | Freq: Three times a day (TID) | SUBCUTANEOUS | Status: DC
  Administered 2013-11-20 – 2013-11-22 (×2): 1 [IU] via SUBCUTANEOUS

## 2013-11-17 MED ORDER — TRAZODONE 25 MG HALF TABLET
25.0000 mg | ORAL_TABLET | Freq: Every day | ORAL | Status: DC
  Administered 2013-11-17 – 2013-11-21 (×5): 25 mg via ORAL
  Filled 2013-11-17 (×6): qty 1

## 2013-11-17 MED ORDER — DIPHENHYDRAMINE HCL 25 MG PO CAPS
25.0000 mg | ORAL_CAPSULE | ORAL | Status: DC | PRN
  Administered 2013-11-20 – 2013-11-21 (×2): 25 mg via ORAL
  Filled 2013-11-17 (×2): qty 1

## 2013-11-17 MED ORDER — ATORVASTATIN CALCIUM 20 MG PO TABS
20.0000 mg | ORAL_TABLET | Freq: Every day | ORAL | Status: DC
  Administered 2013-11-17 – 2013-11-21 (×5): 20 mg via ORAL
  Filled 2013-11-17 (×6): qty 1

## 2013-11-17 MED ORDER — ACETAMINOPHEN 650 MG RE SUPP: 650.0000 mg | Freq: Four times a day (QID) | RECTAL | Status: DC | PRN

## 2013-11-17 MED ORDER — SODIUM CHLORIDE 0.9 % IJ SOLN
3.0000 mL | Freq: Two times a day (BID) | INTRAMUSCULAR | Status: DC
  Administered 2013-11-17 – 2013-11-18 (×2): 3 mL via INTRAVENOUS

## 2013-11-17 MED ORDER — FUROSEMIDE 10 MG/ML IJ SOLN
80.0000 mg | Freq: Two times a day (BID) | INTRAMUSCULAR | Status: DC
  Administered 2013-11-17 – 2013-11-20 (×6): 80 mg via INTRAVENOUS
  Filled 2013-11-17 (×8): qty 8

## 2013-11-17 MED ORDER — INSULIN GLARGINE 100 UNIT/ML ~~LOC~~ SOLN
15.0000 [IU] | Freq: Two times a day (BID) | SUBCUTANEOUS | Status: DC
  Administered 2013-11-17 – 2013-11-22 (×10): 15 [IU] via SUBCUTANEOUS
  Filled 2013-11-17 (×11): qty 0.15

## 2013-11-17 MED ORDER — FUROSEMIDE 10 MG/ML IJ SOLN
100.0000 mg | Freq: Once | INTRAVENOUS | Status: AC
  Administered 2013-11-17: 100 mg via INTRAVENOUS
  Filled 2013-11-17: qty 10

## 2013-11-17 MED ORDER — GUAIFENESIN-DM 100-10 MG/5ML PO SYRP: 5.0000 mL | ORAL_SOLUTION | ORAL | Status: DC | PRN

## 2013-11-17 MED ORDER — SODIUM CHLORIDE 0.9 % IJ SOLN
3.0000 mL | Freq: Two times a day (BID) | INTRAMUSCULAR | Status: DC
  Administered 2013-11-17 – 2013-11-19 (×4): 3 mL via INTRAVENOUS

## 2013-11-17 MED ORDER — DOCUSATE SODIUM 100 MG PO CAPS
100.0000 mg | ORAL_CAPSULE | Freq: Two times a day (BID) | ORAL | Status: DC
  Administered 2013-11-17 – 2013-11-22 (×7): 100 mg via ORAL
  Filled 2013-11-17 (×11): qty 1

## 2013-11-17 MED ORDER — ONDANSETRON HCL 4 MG/2ML IJ SOLN: 4.0000 mg | Freq: Four times a day (QID) | INTRAMUSCULAR | Status: DC | PRN

## 2013-11-17 MED ORDER — LEVALBUTEROL HCL 0.63 MG/3ML IN NEBU
0.6300 mg | INHALATION_SOLUTION | Freq: Four times a day (QID) | RESPIRATORY_TRACT | Status: DC
  Administered 2013-11-17 – 2013-11-20 (×10): 0.63 mg via RESPIRATORY_TRACT
  Filled 2013-11-17 (×18): qty 3

## 2013-11-17 MED ORDER — METOPROLOL TARTRATE 12.5 MG HALF TABLET
12.5000 mg | ORAL_TABLET | Freq: Two times a day (BID) | ORAL | Status: DC
  Administered 2013-11-17 – 2013-11-18 (×2): 12.5 mg via ORAL
  Filled 2013-11-17 (×5): qty 1

## 2013-11-17 MED ORDER — OXYCODONE-ACETAMINOPHEN 5-325 MG PO TABS
1.0000 | ORAL_TABLET | Freq: Four times a day (QID) | ORAL | Status: DC | PRN
  Administered 2013-11-18: 2 via ORAL
  Administered 2013-11-18 – 2013-11-19 (×2): 1 via ORAL
  Administered 2013-11-20: 2 via ORAL
  Filled 2013-11-17: qty 1
  Filled 2013-11-17 (×2): qty 2
  Filled 2013-11-17: qty 1

## 2013-11-17 NOTE — ED Notes (Addendum)
Pt switched to hospital BiPAP machine

## 2013-11-17 NOTE — Consult Note (Signed)
Reason for Consult: Decompensated acute on chronic diastolic heart failure Referring Physician: [facility] hospitalist  Willaim Sheng [name] is an 62 y.o. female.  HPI: Patient is 62 year old female with past medical history significant for multiple medical problems i.e. coronary artery disease, hypertension, COPD, history of cough surface sleep apnea/obesity hypoventilation syndrome, morbid obesity, degenerative joint disease, history of bronchial asthma, history of recurrent A. 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 Patient was admitted at [facility] recently for 6 days and was discharged yesterday back to skilled nursing facility and was transferred to [facility] because of progressive increasing shortness of breath. Patient denies any chest pain nausea vomiting diaphoresis. Denies any PND orthopnea but complains of leg swelling. Denies any palpitation lightheadedness or syncope. Patient also complains of vague abdominal pain without associated nausea or vomiting or diarrhea. Denies any fever or chills but complains of a dry cough.  Past Medical History  Diagnosis Date  . Asthma   . Bronchitis   . Allergic rhinitis   . HTN (hypertension)   . Hyperlipidemia   . Obesity   . OSA (obstructive sleep apnea)     poor compliance with cpap  . CHF (congestive heart failure)   . Biventricular failure     compensated  . Psoriasis   . Stasis edema     of  lower extremities  . Depression   . Situational stress   . Anemia   . Gout   . Chronic anticoagulation   . Yeast infection involving the vagina and surrounding area   . Atrial fibrillation with RVR   . GERD (gastroesophageal reflux disease)   . Chronic bronchitis     "get it q yr" (08/10/2013)  . Pneumonia     "said I did on 08/03/2013 then they ruled it out" (08/10/2013)  . Shortness of breath     "all the time" (08/10/2013)  . On home oxygen therapy     "2L during the night and prn during the day" (08/10/2013)  . Type II diabetes mellitus   . History of blood transfusion     "few during my lifetime; last time was 4 days ago when I got 2 units" (08/10/2013)  . Stroke ?2007    denies residuals on 08/10/2013  . DJD (degenerative joint disease)   . Bleeding     "from my skin folds; just won't stop" (08/10/2013)  . Complication of anesthesia     "they gave me too much; I stayed out of it for awhile" (08/10/2013)  . Anxiety   . Kidney stones   . Kidney disease, chronic, stage III (GFR 30-59 ml/min)   . Chronic kidney disease, stage IV (severe)   . Morbid obesity     Past Surgical History  Procedure Laterality Date  . Appendectomy    . Cholecystectomy    . Total knee arthroplasty Left ~ 2006  . Total abdominal hysterectomy    . Back surgery    . Cystectomy Left  hand  . Tonsillectomy    . Joint replacement    . Lumbar disc surgery    . Colonoscopy N/A 08/16/2013    Procedure: COLONOSCOPY;  Surgeon: Theda Belfast, MD;  Location: [facility];  Service: Endoscopy;  Laterality: N/A;  . Hematoma evacuation Right 09/10/2013    Procedure: EVACUATION OF RIGHT LEG HEMATOMA AND EXCISION DEBRIDIMENT RIGHT LEG ;  Surgeon: Robyne Askew, MD;  Location: [facility] ORS;  Service: General;  Laterality: Right;  . Debridement and closure wound Right 09/22/2013    Procedure: IRRIGATION AND DEBRIDEMENT SURGICAL PREP PLACEMENT OF ACELL AND VAC  ;  Surgeon: Glenna Fellows, MD;  Location: [facility] ORS;  Service:  Plastics;  Laterality: Right;  . Skin split graft Right 10/07/2013    Procedure: SPLIT THICKNESS SKIN GRAFT RIGHT THIGH TO RIGHT LEG, PLACEMENT OF A CEL AND WOUND VAC TO RIGHT THIGH  ;  Surgeon: Glenna Fellows, MD;  Location: [facility] ORS;  Service: Plastics;  Laterality: Right;    Family History  Problem Relation Age of Onset  . Heart attack Father   . Asthma Father   . Heart disease Paternal Uncle   . Rectal cancer Paternal Aunt   . Other Mother     mva    Social History:  reports that she quit smoking about 31 years ago. Her smoking use included Cigarettes. She has a 1.44 pack-year smoking history. She has never used smokeless tobacco. She reports that she does not drink alcohol or use illicit drugs.  Allergies:  Allergies  Allergen Reactions  . Daptomycin Other (See Comments)    Elevated CPK  . Morphine And Related Nausea And Vomiting  . Cephalexin Rash    unknown  . Celecoxib     Unknown, can use furosemide without issue  . Codeine     unknown  . Fluoxetine Hcl     unknown  . Latex     REACTION: undefined  . Ofloxacin     unknown  . Penicillins     unknown  . Rofecoxib     unknown  . Sulfonamide Derivatives     unknown  . Tramadol Nausea And Vomiting    Medications: I have reviewed the patient's current medications.  Results for orders placed during the hospital encounter of 11/17/13 (from the past 48 hour(s))  POCT I-STAT 3, BLOOD GAS (G3+)     Status: Abnormal   Collection Time    11/17/13 10:51 AM      Result Value Range   pH, Arterial 7.287 (*) 7.350 - 7.450   pCO2 arterial 58.2 (*) 35.0 - 45.0 mmHg   pO2, Arterial 68.0 (*) 80.0 - 100.0 mmHg   Bicarbonate 27.8 (*) 20.0 - 24.0 mEq/L   TCO2 30  0 - 100 mmol/L   O2 Saturation 90.0     Acid-Base Excess 1.0  0.0 - 2.0 mmol/L   Collection site RADIAL, ALLEN'S TEST ACCEPTABLE     Drawn by RT     Sample type ARTERIAL     Comment NOTIFIED PHYSICIAN    CBC WITH DIFFERENTIAL     Status: Abnormal   Collection  Time    11/17/13 10:52 AM      Result Value Range   WBC 14.5 (*) 4.0 - 10.5 K/uL   RBC 3.30 (*) 3.87 - 5.11 MIL/uL   Hemoglobin 8.9 (*) 12.0 - 15.0 g/dL   HCT 78.4 (*) 69.6 - 29.5 %   MCV 89.4  78.0 - 100.0 fL  MCH 27.0  26.0 - 34.0 pg   MCHC 30.2  30.0 - 36.0 g/dL   RDW 16.1 (*) 09.6 - 04.5 %   Platelets 324  150 - 400 K/uL   Neutrophils Relative % 84 (*) 43 - 77 %   Neutro Abs 12.1 (*) 1.7 - 7.7 K/uL   Lymphocytes Relative 10 (*) 12 - 46 %   Lymphs Abs 1.4  0.7 - 4.0 K/uL   Monocytes Relative 4  3 - 12 %   Monocytes Absolute 0.6  0.1 - 1.0 K/uL   Eosinophils Relative 2  0 - 5 %   Eosinophils Absolute 0.3  0.0 - 0.7 K/uL   Basophils Relative 0  0 - 1 %   Basophils Absolute 0.1  0.0 - 0.1 K/uL  COMPREHENSIVE METABOLIC PANEL     Status: Abnormal   Collection Time    11/17/13 10:52 AM      Result Value Range   Sodium 140  135 - 145 mEq/L   Potassium 4.6  3.5 - 5.1 mEq/L   Chloride 101  96 - 112 mEq/L   CO2 25  19 - 32 mEq/L   Glucose, Bld 191 (*) 70 - 99 mg/dL   BUN 42 (*) 6 - 23 mg/dL   Creatinine, Ser 4.09 (*) 0.50 - 1.10 mg/dL   Calcium 8.8  8.4 - 81.1 mg/dL   Total Protein 7.9  6.0 - 8.3 g/dL   Albumin 2.8 (*) 3.5 - 5.2 g/dL   AST 26  0 - 37 U/L   ALT 11  0 - 35 U/L   Alkaline Phosphatase 101  39 - 117 U/L   Total Bilirubin 0.2 (*) 0.3 - 1.2 mg/dL   GFR calc non Af Amer 23 (*) >90 mL/min   GFR calc Af Amer 27 (*) >90 mL/min   Comment: (NOTE)     The eGFR has been calculated using the CKD EPI equation.     This calculation has not been validated in all clinical situations.     eGFR's persistently <90 mL/min signify possible Chronic Kidney     Disease.  LACTIC ACID, PLASMA     Status: Abnormal   Collection Time    11/17/13 10:52 AM      Result Value Range   Lactic Acid, Venous 3.4 (*) 0.5 - 2.2 mmol/L  PRO B NATRIURETIC PEPTIDE     Status: Abnormal   Collection Time    11/17/13 10:52 AM      Result Value Range   Pro B Natriuretic peptide (BNP) 2548.0 (*) 0 -  125 pg/mL  PROTIME-INR     Status: None   Collection Time    11/17/13 10:52 AM      Result Value Range   Prothrombin Time 13.3  11.6 - 15.2 seconds   INR 1.03  0.00 - 1.49  URINALYSIS, ROUTINE W REFLEX MICROSCOPIC     Status: Abnormal   Collection Time    11/17/13 11:47 AM      Result Value Range   Color, Urine YELLOW  YELLOW   APPearance CLOUDY (*) CLEAR   Specific Gravity, Urine 1.014  1.005 - 1.030   pH 5.0  5.0 - 8.0   Glucose, UA NEGATIVE  NEGATIVE mg/dL   Hgb urine dipstick NEGATIVE  NEGATIVE   Bilirubin Urine NEGATIVE  NEGATIVE   Ketones, ur NEGATIVE  NEGATIVE mg/dL   Protein, ur 914 (*) NEGATIVE mg/dL   Urobilinogen, UA 0.2  0.0 - 1.0 mg/dL  Nitrite NEGATIVE  NEGATIVE   Leukocytes, UA NEGATIVE  NEGATIVE  URINE MICROSCOPIC-ADD ON     Status: Abnormal   Collection Time    11/17/13 11:47 AM      Result Value Range   Squamous Epithelial / LPF FEW (*) RARE   WBC, UA 0-2  <3 WBC/hpf   RBC / HPF 0-2  <3 RBC/hpf   Bacteria, UA RARE  RARE   Urine-Other AMORPHOUS URATES/PHOSPHATES    CBC WITH DIFFERENTIAL     Status: Abnormal   Collection Time    11/17/13 12:11 PM      Result Value Range   WBC 14.8 (*) 4.0 - 10.5 K/uL   RBC 3.33 (*) 3.87 - 5.11 MIL/uL   Hemoglobin 8.8 (*) 12.0 - 15.0 g/dL   HCT 16.1 (*) 09.6 - 04.5 %   MCV 91.3  78.0 - 100.0 fL   MCH 26.4  26.0 - 34.0 pg   MCHC 28.9 (*) 30.0 - 36.0 g/dL   RDW 40.9 (*) 81.1 - 91.4 %   Platelets 320  150 - 400 K/uL   Neutrophils Relative % 83 (*) 43 - 77 %   Neutro Abs 12.3 (*) 1.7 - 7.7 K/uL   Lymphocytes Relative 10 (*) 12 - 46 %   Lymphs Abs 1.4  0.7 - 4.0 K/uL   Monocytes Relative 5  3 - 12 %   Monocytes Absolute 0.7  0.1 - 1.0 K/uL   Eosinophils Relative 2  0 - 5 %   Eosinophils Absolute 0.3  0.0 - 0.7 K/uL   Basophils Relative 0  0 - 1 %   Basophils Absolute 0.0  0.0 - 0.1 K/uL  MRSA PCR SCREENING     Status: None   Collection Time    11/17/13  4:14 PM      Result Value Range   MRSA by PCR NEGATIVE   NEGATIVE   Comment:            The GeneXpert MRSA Assay (FDA     approved for NASAL specimens     only), is one component of a     comprehensive MRSA colonization     surveillance program. It is not     intended to diagnose MRSA     infection nor to guide or     monitor treatment for     MRSA infections.  GLUCOSE, CAPILLARY     Status: None   Collection Time    11/17/13  4:20 PM      Result Value Range   Glucose-Capillary 86  70 - 99 mg/dL    Dg Chest Port 1 View  11/17/2013   CLINICAL DATA:  Shortness of breath.  EXAM: PORTABLE CHEST - 1 VIEW  COMPARISON:  Chest x-rays dated 11/10/2013 and 01/25/2012  FINDINGS: There is chronic cardiomegaly. Bilateral infiltrates have improved since the prior study. Density at the right base suggests a small effusion. Pulmonary vascularity remains slightly prominent. No acute osseous abnormality.  IMPRESSION: Improving bilateral pulmonary infiltrates. Decreased small right effusion.   Electronically Signed   By: Geanie Cooley M.D.   On: 11/17/2013 11:17    Review of Systems  Eyes: Positive for photophobia. Negative for pain.  Respiratory: Positive for cough and shortness of breath. Negative for sputum production.   Cardiovascular: Positive for leg swelling. Negative for chest pain, palpitations and orthopnea.  Gastrointestinal: Positive for abdominal pain. Negative for nausea and vomiting.  Genitourinary: Negative for dysuria.  Neurological: Negative for  dizziness and headaches.   Blood pressure 175/51, pulse 50, temperature 98.6 F (37 C), temperature source Axillary, resp. rate 23, height 5\' 3"  (1.6 m), weight 125.2 kg (276 lb 0.3 oz), SpO2 94.00%. Physical Exam  HENT:  Head: Normocephalic.  Eyes: Left eye exhibits no discharge. No scleral icterus.  Neck: Neck supple. JVD present. No thyromegaly present.  Cardiovascular: Normal rate and regular rhythm.   Murmur (Soft systolic murmur and S3 gallop noted) heard. Respiratory:  Decreased breath  sound at bases with bibasilar rales  GI: Soft. Bowel sounds are normal. She exhibits distension. There is no tenderness. There is no rebound.  Musculoskeletal:  No clubbing cyanosis 2+ edema noted with chronic dermatosis changes. And skin grafting right leg with  surgical dressing    Assessment/Plan: Acute on chronic decompensated diastolic heart failure Acute on chronic respiratory failure secondary to above Hypertension History of recurrent A. fib flutter in the past status post TEE cardioversion in the past History of pontine CVA Mild to moderate CAD COPD Obstructive sleep apnea/obesity hypoventilation syndrome History of bronchial asthma Morbid obesity History of left knee septic arthritis status post total knee replacement Chronic kidney disease stage II Anemia of chronic disease History of recent GI bleed and hematoma of right leg requiring skin grafting Depression Plan Agree with present management Monitor renal function closely Consider dietary and nutrition consult Consult OT PT consult Restrict fluids to 1500 cc per 24 hours Strict I and os and daily weights Keaun Schnabel N 11/17/2013, 6:41 PM

## 2013-11-17 NOTE — ED Provider Notes (Signed)
CSN: [id number]     Arrival date & time 11/17/13  1021 History   First MD Initiated Contact with Patient 11/17/13 1049     Chief Complaint  Patient presents with  . Respiratory Distress    HPI Per [facility], pt was discharged from here yesterday for resp distress and sent back to [facility]. Room air sats were in the 40's this morning. Staff gave 2 breathing treatments and she was fighting them with that. Upon EMS arrival pt was guppy breathing at rate of 36. Diminished throughout with fluid in the bases. Placed on CPAP and sats came up to 60's. Once pt relaxed and allowed CPAP to work sats came up to 70's. afib on the monitor  Past Medical History  Diagnosis Date  . Asthma   . Bronchitis   . Allergic rhinitis   . HTN (hypertension)   . Hyperlipidemia   . Obesity   . OSA (obstructive sleep apnea)     poor compliance with cpap  . CHF (congestive heart failure)   . Biventricular failure     compensated  . Psoriasis   . Stasis edema     of lower extremities  . Depression   . Situational stress   . Anemia   . Gout   . Chronic anticoagulation   . Yeast infection involving the vagina and surrounding area   . Atrial fibrillation with RVR   . GERD (gastroesophageal reflux disease)   . Chronic bronchitis     "get it q yr" (08/10/2013)  . Pneumonia     "said I did on 08/03/2013 then they ruled it out" (08/10/2013)  . Shortness of breath     "all the time" (08/10/2013)  . On home oxygen therapy     "2L during the night and prn during the day" (08/10/2013)  . Type II diabetes mellitus   . History of blood transfusion     "few during my lifetime; last time was 4 days ago when I got 2 units" (08/10/2013)  . Stroke ?2007    denies residuals on 08/10/2013  . DJD (degenerative joint disease)   . Bleeding     "from my skin folds; just won't stop" (08/10/2013)  . Complication of anesthesia     "they gave me too much; I stayed out of it for awhile" (08/10/2013)  . Anxiety   . Kidney stones    . Kidney disease, chronic, stage III (GFR 30-59 ml/min)   . Chronic kidney disease, stage IV (severe)   . Morbid obesity    Past Surgical History  Procedure Laterality Date  . Appendectomy    . Cholecystectomy    . Total knee arthroplasty Left ~ 2006  . Total abdominal hysterectomy    . Back surgery    . Cystectomy Left     hand  . Tonsillectomy    . Joint replacement    . Lumbar disc surgery    . Colonoscopy N/A 08/16/2013    Procedure: COLONOSCOPY;  Surgeon: Theda Belfast, MD;  Location: [facility] ENDOSCOPY;  Service: Endoscopy;  Laterality: N/A;  . Hematoma evacuation Right 09/10/2013    Procedure: EVACUATION OF RIGHT LEG HEMATOMA AND EXCISION DEBRIDIMENT RIGHT LEG ;  Surgeon: Caleen Essex III, MD;  Location: [facility] ORS;  Service: General;  Laterality: Right;  . Debridement and closure wound Right 09/22/2013    Procedure: IRRIGATION AND DEBRIDEMENT SURGICAL PREP PLACEMENT OF ACELL AND VAC  ;  Surgeon: Glenna Fellows, MD;  Location: [facility] ORS;  Service: Government social research officer;  Laterality: Right;  . Skin split graft Right 10/07/2013    Procedure: SPLIT THICKNESS SKIN GRAFT RIGHT THIGH TO RIGHT LEG, PLACEMENT OF A CEL AND WOUND VAC TO RIGHT THIGH  ;  Surgeon: Glenna Fellows, MD;  Location: [facility] ORS;  Service: Plastics;  Laterality: Right;   Family History  Problem Relation Age of Onset  . Heart attack Father   . Asthma Father   . Heart disease Paternal Uncle   . Rectal cancer Paternal Aunt   . Other Mother     mva   History  Substance Use Topics  . Smoking status: Former Smoker -- 0.12 packs/day for 12 years    Types: Cigarettes    Quit date: 07/05/1982  . Smokeless tobacco: Never Used     Comment: smoked for 1.5 yrs about 2 cigs a day ,only if stressed  . Alcohol Use: No   OB History   Grav Para Term Preterm Abortions TAB SAB Ect Mult Living                 Review of Systems  Unable to perform ROS: Acuity of condition    Allergies  Daptomycin; Morphine and related; Cephalexin; Celecoxib;  Codeine; Fluoxetine hcl; Latex; Ofloxacin; Penicillins; Rofecoxib; Sulfonamide derivatives; and Tramadol  Home Medications   No current outpatient prescriptions on file. BP 160/41  Pulse 59  Temp(Src) 98.7 F (37.1 C) (Oral)  Resp 15  Ht 5\' 3"  (1.6 m)  Wt 271 lb 2.7 oz (123 kg)  BMI 48.05 kg/m2  SpO2 93% Physical Exam  Nursing note and vitals reviewed. Constitutional: She is oriented to person, place, and time. She appears well-developed and well-nourished. She appears distressed.  HENT:  Head: Normocephalic and atraumatic.  Eyes: Pupils are equal, round, and reactive to light.  Neck: Normal range of motion.  Cardiovascular: Normal rate and intact distal pulses.   Pulmonary/Chest: She is in respiratory distress. She has wheezes. She has rales.  Abdominal: Normal appearance. She exhibits no distension. There is no tenderness. There is no rebound.  Musculoskeletal: Normal range of motion. She exhibits edema.  Neurological: She is alert and oriented to person, place, and time. No cranial nerve deficit.  Skin: Skin is warm and dry. No rash noted.  Psychiatric: She has a normal mood and affect. Her behavior is normal.    ED Course  Procedures (including critical care time)  Medications  aspirin tablet 325 mg (325 mg Oral Given 11/18/13 0932)  diphenhydrAMINE (BENADRYL) capsule 25 mg (not administered)  docusate sodium (COLACE) capsule 100 mg (100 mg Oral Given 11/18/13 0932)  hydrALAZINE (APRESOLINE) tablet 50 mg (50 mg Oral Given 11/18/13 0932)  insulin glargine (LANTUS) injection 15 Units (15 Units Subcutaneous Given 11/18/13 0933)  metoCLOPramide (REGLAN) tablet 5 mg (5 mg Oral Given 11/18/13 1200)  oxyCODONE-acetaminophen (PERCOCET/ROXICET) 5-325 MG per tablet 1-2 tablet (not administered)  potassium chloride (K-DUR) CR tablet 20 mEq (20 mEq Oral Given 11/18/13 0932)  traZODone (DESYREL) tablet 25 mg (25 mg Oral Given 11/17/13 2133)  metoprolol tartrate (LOPRESSOR) tablet  12.5 mg (12.5 mg Oral Given 11/18/13 0932)  amLODipine (NORVASC) tablet 10 mg (10 mg Oral Given 11/18/13 0933)  pantoprazole (PROTONIX) EC tablet 40 mg (40 mg Oral Given 11/18/13 0932)  ferrous sulfate tablet 325 mg (325 mg Oral Given 11/18/13 1200)  amiodarone (PACERONE) tablet 200 mg (200 mg Oral Given 11/18/13 0933)  atorvastatin (LIPITOR) tablet 20 mg (20 mg Oral Given 11/17/13 2137)  allopurinol (ZYLOPRIM) tablet 100  mg (100 mg Oral Given 11/18/13 0933)  furosemide (LASIX) injection 80 mg (80 mg Intravenous Given 11/18/13 0931)  heparin injection 5,000 Units (5,000 Units Subcutaneous Given 11/18/13 0556)  sodium chloride 0.9 % injection 3 mL (3 mLs Intravenous Given 11/18/13 0934)  sodium chloride 0.9 % injection 3 mL (3 mLs Intravenous Given 11/18/13 0934)  sodium chloride 0.9 % injection 3 mL (not administered)  0.9 %  sodium chloride infusion (not administered)  acetaminophen (TYLENOL) tablet 650 mg (not administered)    Or  acetaminophen (TYLENOL) suppository 650 mg (not administered)  senna (SENOKOT) tablet 17.2 mg (0 mg Oral Hold 11/18/13 1000)  ondansetron (ZOFRAN) tablet 4 mg (not administered)    Or  ondansetron (ZOFRAN) injection 4 mg (not administered)  levalbuterol (XOPENEX) nebulizer solution 0.63 mg (0.63 mg Nebulization Given 11/18/13 1246)  guaiFENesin-dextromethorphan (ROBITUSSIN DM) 100-10 MG/5ML syrup 5 mL (not administered)  insulin aspart (novoLOG) injection 0-9 Units (0 Units Subcutaneous Hold 11/18/13 1200)  cyanocobalamin ((VITAMIN B-12)) injection 1,000 mcg (not administered)  folic acid injection 1 mg (not administered)  furosemide (LASIX) 100 mg in dextrose 5 % 50 mL IVPB (0 mg Intravenous Stopped 11/17/13 1250)     CRITICAL CARE Performed by: Nelva Nay L Total critical care time: 45 min Critical care time was exclusive of separately billable procedures and treating other patients. Critical care was necessary to treat or prevent imminent or  life-threatening deterioration. Critical care was time spent personally by me on the following activities: development of treatment plan with patient and/or surrogate as well as nursing, discussions with consultants, evaluation of patient's response to treatment, examination of patient, obtaining history from patient or surrogate, ordering and performing treatments and interventions, ordering and review of laboratory studies, ordering and review of radiographic studies, pulse oximetry and re-evaluation of patient's condition.  Labs Review Labs Reviewed  CBC WITH DIFFERENTIAL - Abnormal; Notable for the following:    WBC 14.5 (*)    RBC 3.30 (*)    Hemoglobin 8.9 (*)    HCT 29.5 (*)    RDW 17.2 (*)    Neutrophils Relative % 84 (*)    Neutro Abs 12.1 (*)    Lymphocytes Relative 10 (*)    All other components within normal limits  COMPREHENSIVE METABOLIC PANEL - Abnormal; Notable for the following:    Glucose, Bld 191 (*)    BUN 42 (*)    Creatinine, Ser 2.17 (*)    Albumin 2.8 (*)    Total Bilirubin 0.2 (*)    GFR calc non Af Amer 23 (*)    GFR calc Af Amer 27 (*)    All other components within normal limits  LACTIC ACID, PLASMA - Abnormal; Notable for the following:    Lactic Acid, Venous 3.4 (*)    All other components within normal limits  PRO B NATRIURETIC PEPTIDE - Abnormal; Notable for the following:    Pro B Natriuretic peptide (BNP) 2548.0 (*)    All other components within normal limits  URINALYSIS, ROUTINE W REFLEX MICROSCOPIC - Abnormal; Notable for the following:    APPearance CLOUDY (*)    Protein, ur 100 (*)    All other components within normal limits  URINE MICROSCOPIC-ADD ON - Abnormal; Notable for the following:    Squamous Epithelial / LPF FEW (*)    All other components within normal limits  IRON AND TIBC - Abnormal; Notable for the following:    Iron 35 (*)    TIBC 210 (*)  Saturation Ratios 17 (*)    All other components within normal limits  CBC WITH  DIFFERENTIAL - Abnormal; Notable for the following:    WBC 14.8 (*)    RBC 3.33 (*)    Hemoglobin 8.8 (*)    HCT 30.4 (*)    MCHC 28.9 (*)    RDW 17.5 (*)    Neutrophils Relative % 83 (*)    Neutro Abs 12.3 (*)    Lymphocytes Relative 10 (*)    All other components within normal limits  TSH - Abnormal; Notable for the following:    TSH 7.323 (*)    All other components within normal limits  BASIC METABOLIC PANEL - Abnormal; Notable for the following:    BUN 46 (*)    Creatinine, Ser 2.39 (*)    GFR calc non Af Amer 20 (*)    GFR calc Af Amer 24 (*)    All other components within normal limits  CBC WITH DIFFERENTIAL - Abnormal; Notable for the following:    RBC 2.86 (*)    Hemoglobin 7.5 (*)    HCT 25.6 (*)    MCHC 29.3 (*)    RDW 17.3 (*)    Neutrophils Relative % 79 (*)    All other components within normal limits  POCT I-STAT 3, BLOOD GAS (G3+) - Abnormal; Notable for the following:    pH, Arterial 7.287 (*)    pCO2 arterial 58.2 (*)    pO2, Arterial 68.0 (*)    Bicarbonate 27.8 (*)    All other components within normal limits  MRSA PCR SCREENING  PROTIME-INR  VITAMIN B12  FERRITIN  T4, FREE  GLUCOSE, CAPILLARY  GLUCOSE, CAPILLARY  GLUCOSE, CAPILLARY  CBC   Imaging Review Dg Chest Port 1 View  11/17/2013   CLINICAL DATA:  Shortness of breath.  EXAM: PORTABLE CHEST - 1 VIEW  COMPARISON:  Chest x-rays dated 11/10/2013 and 01/25/2012  FINDINGS: There is chronic cardiomegaly. Bilateral infiltrates have improved since the prior study. Density at the right base suggests a small effusion. Pulmonary vascularity remains slightly prominent. No acute osseous abnormality.  IMPRESSION: Improving bilateral pulmonary infiltrates. Decreased small right effusion.   Electronically Signed   By: Geanie Cooley M.D.   On: 11/17/2013 11:17     Date: 11/17/2013  Rate: 107  Rhythm: Atrial fibrillation  QRS Axis: normal  Intervals: normal  ST/T Wave abnormalities: normal  Conduction  Disutrbances: Borderline prolonged QT  Narrative Interpretation: Abnormal EKG      MDM   1. Congestive heart failure   2. Hypoxia   3. Acute combined systolic and diastolic heart failure   4. Acute respiratory failure   5. Atrial fibrillation   6. Chronic anemia   7. CKD (chronic kidney disease), stage IV   8. DM (diabetes mellitus), type 2 with renal complications   9. Obesity   10. Unspecified constipation   11. Unspecified essential hypertension        Nelia Shi, MD 11/18/13 1321

## 2013-11-17 NOTE — Consult Note (Signed)
WOC consulted completed Monday 11/15/13, I have resumed the care of her wounds and I will re-evaluate after one week of tx.  Cabot Cromartie Hatton RN,CWOCN [phone]

## 2013-11-17 NOTE — ED Notes (Signed)
Per [facility], pt was discharged from here yesterday for resp distress and sent back to [facility]. Room air sats were in the 40's this morning. Staff gave 2 breathing treatments and she was fighting them with that. Upon EMS arrival pt was guppy breathing at rate of 36. Diminished throughout with fluid in the bases. Placed on CPAP and sats came up to 60's. Once pt relaxed and allowed CPAP to work sats came up to 70's. afib on the monitor. No IV access.

## 2013-11-17 NOTE — ED Notes (Signed)
Attempted report 

## 2013-11-17 NOTE — ED Notes (Signed)
Given pt some water

## 2013-11-17 NOTE — Progress Notes (Signed)
**Note De-Identified  Obfuscation** RT note: patient removed from BIPAP and placed on 4L [place]; patient tolerated well. RT to cont. To monitor.

## 2013-11-17 NOTE — H&P (Addendum)
[facility] History and Physical  [name] ZOX:[id number] DOB: Dec 03, 1951 DOA: 11/17/2013  Referring physician: Radford Pax PCP: [name] [name], MD  Specialists: Sharyn Lull  Chief Complaint: Shortness of breath.  HPI: [name] is a 62 y.o. female who has resided at skilled nursing facility over the past 3 months, presents to the emergency room with shortness of breath. She was just discharged from [facility] for acute respiratory failure secondary to CHF, obesity hypoventilation syndrome, with noncompliance with CPAP, and a component of pneumonia. She has a history of obstructive sleep apnea and continues to refuse CPAP, although she is wearing it currently. In reviewing the last hospitalization, it appears that her weight on admission was 276 pounds. At discharge, was 278 pounds. She has an occasional cough which is nonproductive. She received antibiotics during the last hospitalization. She has had no fevers or chills. Her white blood cell count in the emergency room was 14,000. Chest x-ray today shows improved pulmonary edema. Patient and husband report that she still felt short of breath at discharge and that she had nausea. She has no chest pain or palpitations. She has a history of paroxysmal atrial fibrillation and is not on anticoagulation du to bleeding complications including GI bleed and severe leg hematoma requiring surgery and skin grafting. Patient was reportedly hypoxic when EMS arrived at the facility. On arrival in the emergency room, she was on BiPAP, with a respiratory rate of 36 and shallow respirations. ABG showed on BiPAP showed a pH of 7.287. PCO2 58. PO2 68. She feels a little better currently. She has received 100 mg of IV Lasix. Last echocardiogram on 10/15/2013 showed an ejection fraction of 50-55%, grade 1 diastolic dysfunction, but previous dictations report mixed systolic and diastolic heart failure. Reviewing previous echoes show ejection fraction in the  past of 40%. She is followed by Dr. Sharyn Lull, and was instructed to followup with him within a week, but he was not consulted during the last hospitalization.  Review of Systems: Difficult as patient is on BiPAP but as above otherwise negative.  Past Medical History  Diagnosis Date  . Asthma   . Bronchitis   . Allergic rhinitis   . HTN (hypertension)   . Hyperlipidemia   . Obesity   . OSA (obstructive sleep apnea)     poor compliance with cpap  . CHF (congestive heart failure)   . Biventricular failure     compensated  . Psoriasis   . Stasis edema     of lower extremities  . Depression   . Situational stress   . Anemia   . Gout   . Chronic anticoagulation   . Yeast infection involving the vagina and surrounding area   . Atrial fibrillation with RVR   . GERD (gastroesophageal reflux disease)   . Chronic bronchitis     "get it q yr" (08/10/2013)  . Pneumonia     "said I did on 08/03/2013 then they ruled it out" (08/10/2013)  . Shortness of breath     "all the time" (08/10/2013)  . On home oxygen therapy     "2L during the night and prn during the day" (08/10/2013)  . Type II diabetes mellitus   . History of blood transfusion     "few during my lifetime; last time was 4 days ago when I got 2 units" (08/10/2013)  . Stroke ?2007    denies residuals on 08/10/2013  . DJD (degenerative joint disease)   . Bleeding     "  from my skin folds; just won't stop" (08/10/2013)  . Complication of anesthesia     "they gave me too much; I stayed out of it for awhile" (08/10/2013)  . Anxiety   . Kidney stones   . Kidney disease, chronic, stage III (GFR 30-59 ml/min)   . Chronic kidney disease, stage IV (severe)   . Morbid obesity    Past Surgical History  Procedure Laterality Date  . Appendectomy    . Cholecystectomy    . Total knee arthroplasty Left ~ 2006  . Total abdominal hysterectomy    . Back surgery    . Cystectomy Left     hand  . Tonsillectomy    . Joint replacement    . Lumbar disc  surgery    . Colonoscopy N/A 08/16/2013    Procedure: COLONOSCOPY;  Surgeon: Theda Belfast, MD;  Location: [facility] ENDOSCOPY;  Service: Endoscopy;  Laterality: N/A;  . Hematoma evacuation Right 09/10/2013    Procedure: EVACUATION OF RIGHT LEG HEMATOMA AND EXCISION DEBRIDIMENT RIGHT LEG ;  Surgeon: Robyne Askew, MD;  Location: [facility] ORS;  Service: General;  Laterality: Right;  . Debridement and closure wound Right 09/22/2013    Procedure: IRRIGATION AND DEBRIDEMENT SURGICAL PREP PLACEMENT OF ACELL AND VAC  ;  Surgeon: Glenna Fellows, MD;  Location: [facility] ORS;  Service: Plastics;  Laterality: Right;  . Skin split graft Right 10/07/2013    Procedure: SPLIT THICKNESS SKIN GRAFT RIGHT THIGH TO RIGHT LEG, PLACEMENT OF A CEL AND WOUND VAC TO RIGHT THIGH  ;  Surgeon: Glenna Fellows, MD;  Location: [facility] ORS;  Service: Plastics;  Laterality: Right;   Social History:  reports that she quit smoking about 31 years ago. Her smoking use included Cigarettes. She has a 1.44 pack-year smoking history. She has never used smokeless tobacco. She reports that she does not drink alcohol or use illicit drugs. has been at skilled nursing facility for the past 3 months. She is married.  Allergies  Allergen Reactions  . Daptomycin Other (See Comments)    Elevated CPK  . Morphine And Related Nausea And Vomiting  . Cephalexin Rash    unknown  . Celecoxib     Unknown, can use furosemide without issue  . Codeine     unknown  . Fluoxetine Hcl     unknown  . Latex     REACTION: undefined  . Ofloxacin     unknown  . Penicillins     unknown  . Rofecoxib     unknown  . Sulfonamide Derivatives     unknown  . Tramadol Nausea And Vomiting    Family History  Problem Relation Age of Onset  . Heart attack Father   . Asthma Father   . Heart disease Paternal Uncle   . Rectal cancer Paternal Aunt   . Other Mother     mva    Prior to Admission medications   Medication Sig Start Date End Date Taking? Authorizing Provider   albuterol (PROVENTIL HFA;VENTOLIN HFA) 108 (90 BASE) MCG/ACT inhaler Inhale 2 puffs into the lungs every 6 (six) hours as needed for wheezing or shortness of breath.     Historical Provider, MD  allopurinol (ZYLOPRIM) 100 MG tablet Take 100 mg by mouth daily. Patient takes at 0800am daily 09/16/11   Historical Provider, MD  amiodarone (PACERONE) 200 MG tablet Take 200 mg by mouth daily. Patient takes at 0800am daily    Historical Provider, MD  amLODipine (NORVASC) 10 MG  tablet Take 10 mg by mouth daily. Patient takes at 0800am daily 08/19/13   Leroy Sea, MD  aspirin 325 MG tablet Take 325 mg by mouth daily.    Historical Provider, MD  atorvastatin (LIPITOR) 20 MG tablet Take 20 mg by mouth at bedtime. Patient takes at 2100    Historical Provider, MD  diphenhydrAMINE (BENADRYL) 25 mg capsule Take 1 capsule (25 mg total) by mouth every 4 (four) hours as needed for itching. 11/15/13   Maryruth Bun Rama, MD  diphenhydrAMINE-zinc acetate (BENADRYL) cream Apply topically 3 (three) times daily as needed for itching. 11/15/13   Maryruth Bun Rama, MD  docusate sodium (COLACE) 100 MG capsule Take 100 mg by mouth 2 (two) times daily.    Historical Provider, MD  ferrous sulfate 325 (65 FE) MG tablet Take 325 mg by mouth 3 (three) times daily with meals. Patient takes at 0800, 1200, and 1700 08/29/12   Gwenyth Bender, MD  furosemide (LASIX) 40 MG tablet Take 40 mg by mouth 2 (two) times daily. Patient takes at 0800am daily 08/19/13   Leroy Sea, MD  hydrALAZINE (APRESOLINE) 50 MG tablet Take 50 mg by mouth 3 (three) times daily.    Historical Provider, MD  insulin aspart (NOVOLOG) 100 UNIT/ML injection Inject 0-15 Units into the skin 3 (three) times daily with meals. 11/15/13   Maryruth Bun Rama, MD  insulin glargine (LANTUS) 100 UNIT/ML injection Inject 0.15 mLs (15 Units total) into the skin 2 (two) times daily. 11/16/13   Maryruth Bun Rama, MD  metoCLOPramide (REGLAN) 5 MG tablet Take 1 tablet (5 mg total) by  mouth 3 (three) times daily before meals. 11/15/13   Maryruth Bun Rama, MD  metoprolol tartrate (LOPRESSOR) 12.5 mg TABS tablet Take 0.5 tablets (12.5 mg total) by mouth 2 (two) times daily. 10/19/13   Drema Dallas, MD  omeprazole (PRILOSEC) 20 MG capsule Take 20 mg by mouth daily.    Historical Provider, MD  ondansetron (ZOFRAN) 4 MG tablet Take 1 tablet (4 mg total) by mouth every 6 (six) hours as needed for nausea. 10/19/13   Drema Dallas, MD  oxyCODONE-acetaminophen (ROXICET) 5-325 MG per tablet Take 1-2 tablets by mouth every 6 (six) hours as needed for moderate pain or severe pain. 11/16/13   Christina P Rama, MD  pantoprazole (PROTONIX) 40 MG tablet Take 40 mg by mouth daily.    Historical Provider, MD  polyethylene glycol (MIRALAX / GLYCOLAX) packet Take 17 g by mouth 2 (two) times daily. 11/15/13   Maryruth Bun Rama, MD  potassium chloride 20 MEQ TBCR Take 20 mEq by mouth daily. 11/15/13   Maryruth Bun Rama, MD  traZODone (DESYREL) 50 MG tablet Take 25 mg by mouth at bedtime.    Historical Provider, MD   Physical Exam: Filed Vitals:   11/17/13 1227  BP: 141/78  Pulse: 112  Temp:   Resp:    BP 141/78  Pulse 112  Temp(Src) 97.6 F (36.4 C) (Rectal)  Resp 23  Wt 126.1 kg (278 lb)  SpO2 94%  General Appearance:    Alert, cooperative, Answers questions well on BiPAP.   Head:    Normocephalic, without obvious abnormality, atraumatic  Eyes:    PERRL, conjunctiva/corneas clear, EOM's intact, fundi    benign, both eyes     Nose:   Nares normal, septum midline, mucosa normal, no drainage    or sinus tenderness  Throat:   Lips, mucosa, and tongue normal; teeth and gums  normal  Neck:   Supple, symmetrical, trachea midline, no adenopathy;    thyroid:  no enlargement/tenderness/nodules; no carotid   bruit or JVD  Back:     Symmetric, no curvature, ROM normal, no CVA tenderness  Lungs:     diminished throughout without wheezes rhonchi or rales   Chest Wall:    No tenderness or deformity    Heart:   irregularly irregular without murmurs gallops rubs      Abdomen:     Soft, non-tender, bowel sounds present. Obese.   Genitalia:   deferred   Rectal:   deferred   Extremities:   Nodular hyperpigmented skin left lower extremity without any pitting edema. Right leg donor site on the thigh without drainage or odor, healing. Skin graft site without drainage or erythema. There is about a 1 cm wound with a dressing packed into it.   Pulses:   2+ and symmetric all extremities  Skin:  see above   Lymph nodes:   Cervical, supraclavicular, and axillary nodes normal  Neurologic:   CNII-XII intact, normal strength, sensation and reflexes    throughout     psychiatric: Normal affect.   Labs on Admission:  Basic Metabolic Panel:  Recent Labs Lab 11/12/13 0445 11/13/13 0620 11/14/13 0358 11/15/13 0415 11/17/13 1052  NA 137 136 135 136 140  K 4.8 4.9 4.5 4.5 4.6  CL 100 100 99 100 101  CO2 30 31 31 31 25   GLUCOSE 80 69* 110* 88 191*  BUN 47* 49* 54* 50* 42*  CREATININE 2.56* 2.60* 2.61* 2.49* 2.17*  CALCIUM 8.2* 8.4 8.2* 8.4 8.8   Liver Function Tests:  Recent Labs Lab 11/17/13 1052  AST 26  ALT 11  ALKPHOS 101  BILITOT 0.2*  PROT 7.9  ALBUMIN 2.8*   No results found for this basename: LIPASE, AMYLASE,  in the last 168 hours No results found for this basename: AMMONIA,  in the last 168 hours CBC:  Recent Labs Lab 11/11/13 0420 11/14/13 0358 11/15/13 0415 11/17/13 1052  WBC 7.9 6.8 7.4 14.5*  NEUTROABS  --   --   --  12.1*  HGB 7.4* 7.2* 7.2* 8.9*  HCT 25.2* 24.7* 24.1* 29.5*  MCV 89.0 88.5 88.0 89.4  PLT 188 217 205 324   Cardiac Enzymes: No results found for this basename: CKTOTAL, CKMB, CKMBINDEX, TROPONINI,  in the last 168 hours  BNP (last 3 results)  Recent Labs  10/13/13 2041 11/09/13 1605 11/17/13 1052  PROBNP 2342.0* 3130.0* 2548.0*   CBG:  Recent Labs Lab 11/15/13 2205 11/16/13 0735 11/16/13 0811 11/16/13 1139 11/16/13 1639   GLUCAP 117* 64* 93 128* 93    Radiological Exams on Admission: Dg Chest Port 1 View  11/17/2013   CLINICAL DATA:  Shortness of breath.  EXAM: PORTABLE CHEST - 1 VIEW  COMPARISON:  Chest x-rays dated 11/10/2013 and 01/25/2012  FINDINGS: There is chronic cardiomegaly. Bilateral infiltrates have improved since the prior study. Density at the right base suggests a small effusion. Pulmonary vascularity remains slightly prominent. No acute osseous abnormality.  IMPRESSION: Improving bilateral pulmonary infiltrates. Decreased small right effusion.   Electronically Signed   By: Geanie Cooley M.D.   On: 11/17/2013 11:17    EKG: Per EDP shows atrial fibrillation. Not currently in Epic chart. Telemetry monitor is showing atrial fibrillation with a rate ranging about 90-110.  Assessment/Plan Principal Problem:   Acute respiratory failure:   Likely mainly congestive heart failure. She did not diuresis much in  terms of weight during the last hospitalization. Will admit to step down. Continue noninvasive positive pressure ventilation. Give Lasix 80 mg IV twice a day. Monitor creatinine. Have consulted Dr. Sharyn Lull.  Daily weights. Strict ins and outs. No need to repeat echocardiogram. TSH was above 4 in November, but no free T4 was done. Will repeat thyroid function tests. She is encouraged to comply with CPAP nightly, as there may be a component of obesity hypoventilation syndrome. Review EKG. Will fluid restrict.    Acute combined systolic and diastolic heart failure: Last echocardiogram actually showed improved ejection fraction. See above.    HYPERTENSION    DM (diabetes mellitus), type 2 with renal complications: Continue home regimen. It appears that she was placed on Reglan at discharge for nausea and concern over gastroparesis. I will continue for now and continue to monitor her symptoms.    PAF (paroxysmal atrial fibrillation) Not on anticoagulations secondary to complications as noted in history of  present illness.    Chronic anemia: In chart, mention of chronic disease, but ferritin low previously and patient is on iron. Also, had a B12 level that was low earlier in the year. Will repeat. Continue iron. May also be related to chronic kidney disease. No reported gross bleeding. Her hemoglobin is actually improved from when she was last in the hospital. Monitor.    HYPERLIPIDEMIA    OBSTRUCTIVE SLEEP APNEA, Noncompliant with CPAP.    CKD (chronic kidney disease), stage IV    Psoriasis    Unspecified constipation    Wound of right leg    Gout    Obesity    OSA (obstructive sleep apnea)   Patient is known to Dr. Sharyn Lull, and I have consulted him, as patient has had recurrent hospitalizations for CHF exacerbations.  Code Status: *Full Family Communication: *Husband at bedside Disposition Plan: *back to SNF  Critical care time: 60 minutes  Jamisyn Langer L [facility] Pager [phone]  If 7PM-7AM, please contact night-coverage [email or web address] Password [email or web address] 11/17/2013, 1:14 PM

## 2013-11-17 NOTE — ED Notes (Signed)
Dr. Radford Pax at the bedside. RT at the bedside.

## 2013-11-17 NOTE — Progress Notes (Addendum)
CSW received consult that pt is from SNF: [facility]. CSW will complete full assessment tomorrow.  Addendum: CSW noticed pt has [employer]. CSW called [facility] Medicare rep and left a voicemail asking if rep will need clinicals via fax to authorize going back to SNF. Awaiting return call.  Maryclare Labrador, MSW, [facility] Clinical Social Worker [phone]

## 2013-11-17 NOTE — ED Notes (Signed)
Spouse at the bedside.

## 2013-11-17 NOTE — Care Management Note (Signed)
    Page 1 of 1   11/17/2013     3:59:00 PM   CARE MANAGEMENT NOTE 11/17/2013  Patient:  [name], [name]   Account Number:  [id number]  Date Initiated:  11/17/2013  Documentation initiated by:  Junius Creamer  Subjective/Objective Assessment:   adm w chf/resp failure     Action/Plan:   from nsg facility   Anticipated DC Date:     Anticipated DC Plan:  SKILLED NURSING FACILITY  In-house referral  Clinical Social Worker      DC Planning Services  CM consult      Choice offered to / List presented to:             Status of service:   Medicare Important Message given?   (If response is "NO", the following Medicare IM given date fields will be blank) Date Medicare IM given:   Date Additional Medicare IM given:    Discharge Disposition:    Per UR Regulation:  Reviewed for med. necessity/level of care/duration of stay  If discussed at Long Length of Stay Meetings, dates discussed:    Comments:

## 2013-11-17 NOTE — ED Notes (Signed)
Admitting physician at the bedside.

## 2013-11-18 ENCOUNTER — Inpatient Hospital Stay (HOSPITAL_COMMUNITY): Payer: Medicare HMO

## 2013-11-18 LAB — BASIC METABOLIC PANEL
CO2: 32 mEq/L (ref 19–32)
Calcium: 8.8 mg/dL (ref 8.4–10.5)
Chloride: 104 mEq/L (ref 96–112)
Creatinine, Ser: 2.39 mg/dL — ABNORMAL HIGH (ref 0.50–1.10)
GFR calc Af Amer: 24 mL/min — ABNORMAL LOW (ref >90)
Glucose, Bld: 79 mg/dL (ref 70–99)

## 2013-11-18 LAB — CBC WITH DIFFERENTIAL/PLATELET
Basophils Absolute: 0 10*3/uL (ref 0.0–0.1)
Eosinophils Absolute: 0.2 10*3/uL (ref 0.0–0.7)
Eosinophils Relative: 3 % (ref 0–5)
HCT: 25.6 % — ABNORMAL LOW (ref 36.0–46.0)
Hemoglobin: 7.5 g/dL — ABNORMAL LOW (ref 12.0–15.0)
Lymphocytes Relative: 12 % (ref 12–46)
Lymphs Abs: 0.8 10*3/uL (ref 0.7–4.0)
MCH: 26.2 pg (ref 26.0–34.0)
MCV: 89.5 fL (ref 78.0–100.0)
Monocytes Absolute: 0.4 10*3/uL (ref 0.1–1.0)
Monocytes Relative: 6 % (ref 3–12)
Platelets: 239 10*3/uL (ref 150–400)
RDW: 17.3 % — ABNORMAL HIGH (ref 11.5–15.5)
WBC: 6.8 10*3/uL (ref 4.0–10.5)

## 2013-11-18 LAB — CBC
HCT: 26.3 % — ABNORMAL LOW (ref 36.0–46.0)
Hemoglobin: 7.6 g/dL — ABNORMAL LOW (ref 12.0–15.0)
MCH: 25.8 pg — ABNORMAL LOW (ref 26.0–34.0)
MCHC: 28.9 g/dL — ABNORMAL LOW (ref 30.0–36.0)
MCV: 89.2 fL (ref 78.0–100.0)
RDW: 17.4 % — ABNORMAL HIGH (ref 11.5–15.5)

## 2013-11-18 LAB — GLUCOSE, CAPILLARY
Glucose-Capillary: 97 mg/dL (ref 70–99)
Glucose-Capillary: 124 mg/dL — ABNORMAL HIGH (ref 70–99)

## 2013-11-18 MED ORDER — HYDRALAZINE HCL 50 MG PO TABS
100.0000 mg | ORAL_TABLET | Freq: Three times a day (TID) | ORAL | Status: DC
  Administered 2013-11-18 – 2013-11-22 (×13): 100 mg via ORAL
  Filled 2013-11-18 (×16): qty 2

## 2013-11-18 MED ORDER — FOLIC ACID 5 MG/ML IJ SOLN
1.0000 mg | Freq: Every day | INTRAMUSCULAR | Status: DC
  Administered 2013-11-18: 1 mg via INTRAVENOUS
  Filled 2013-11-18 (×2): qty 0.2

## 2013-11-18 MED ORDER — CYANOCOBALAMIN 1000 MCG/ML IJ SOLN
1000.0000 ug | Freq: Every day | INTRAMUSCULAR | Status: AC
  Administered 2013-11-18 – 2013-11-22 (×5): 1000 ug via SUBCUTANEOUS
  Filled 2013-11-18 (×5): qty 1

## 2013-11-18 NOTE — Progress Notes (Signed)
Placed pt. On CPAP of 8cm H2O with 40% FIO2 via FFM. Pt. Is tolerating CPAP well at this time without any complications.

## 2013-11-18 NOTE — Progress Notes (Signed)
Clinical Social Work Department BRIEF PSYCHOSOCIAL ASSESSMENT 11/18/2013  Patient:  [name], [name]     Account Number:  [id number]     Admit date:  11/17/2013  Clinical Social Worker:  Varney Biles  Date/Time:  11/18/2013 02:59 PM  Referred by:  Physician  Date Referred:  11/18/2013 Referred for  SNF Placement   Other Referral:   Interview type:  Patient Other interview type:    PSYCHOSOCIAL DATA Living Status:  FAMILY Admitted from facility:   Level of care:   Primary support name:  Koree Staheli [phone]  & [phone]) Primary support relationship to patient:  SPOUSE Degree of support available:   Good--pt living at home with her husband and two adult sons, but was residing at [facility] for rehab before coming to the hospital.    CURRENT CONCERNS Current Concerns  Post-Acute Placement   Other Concerns:    SOCIAL WORK ASSESSMENT / PLAN CSW asked pt if she was living at [facility] before coming to the hospital. Pt states she is a resident at [facility], but that she lives with her husband and their two adult sons (ages 24 & 30). CSW explained role in facilitating discharge back to [facility]. Pt understanding. CSW called [facility] rep and informed her pt is on unit. Facility has started Firefighter for pt to return. CSW will facilitate discharge when appropriate.   Assessment/plan status:  Psychosocial Support/Ongoing Assessment of Needs Other assessment/ plan:   Information/referral to community resources:   SNF [facility]).    PATIENT'S/FAMILY'S RESPONSE TO PLAN OF CARE: Pt receptive to CSW visit, and answered all CSW questions. Pt made it clear that she does not live at [facility]; she was getting rehab at [facility] but lives with her husband and their adult sons. CSW expressed understanding of this and apologized for any confusion caused by CSW wording when CSW asked if pt  was living at [facility] prior to coming to the hospital. CSW continues to follow case and will transition pt back to [facility] when time for discharge.       Maryclare Labrador, MSW, [facility] Clinical Social Worker [phone]

## 2013-11-18 NOTE — Progress Notes (Signed)
Subjective:  patient complains of vague abdominal pain. Denies any nausea vomiting or diarrhea. Denies any bright red blood per rectum. Noted to have significant hemoglobin drop to 7.5 this a.m. Denies any chest pain states breathing has improved Patient is off BiPAP and tolerating nasal cannula.  Objective:  Vital Signs in the last 24 hours: Temp:  [98.2 F (36.8 C)-98.7 F (37.1 C)] 98.7 F (37.1 C) (12/11 0754) Pulse Rate:  [46-117] 59 (12/11 0800) Resp:  [15-33] 15 (12/11 0800) BP: (143-176)/(32-76) 160/41 mmHg (12/11 0800) SpO2:  [90 %-100 %] 91 % (12/11 0844) FiO2 (%):  [40 %] 40 % (12/11 0800) Weight:  [123 kg (271 lb 2.7 oz)-125.2 kg (276 lb 0.3 oz)] 123 kg (271 lb 2.7 oz) (12/11 0500)  Intake/Output from previous day: 12/10 0701 - 12/11 0700 In: 410 [P.O.:360; I.V.:50] Out: 2200 [Urine:2200] Intake/Output from this shift: Total I/O In: 120 [P.O.:120] Out: 750 [Urine:750]  Physical Exam: Neck: no adenopathy, no carotid bruit, no JVD and supple, symmetrical, trachea midline Lungs: Decreased breath sound at bases with faint rales Heart: regular rate and rhythm, S1, S2 normal and Soft systolic murmur and S3 gallop noted Abdomen: soft, non-tender; bowel sounds normal; no masses,  no organomegaly Extremities: No clubbing cyanosis 1+ edema with chronic dermatosis changes noted  Lab Results:  Recent Labs  11/17/13 1211 11/18/13 0457  WBC 14.8* 6.8  HGB 8.8* 7.5*  PLT 320 239    Recent Labs  11/17/13 1052 11/18/13 0457  NA 140 144  K 4.6 5.1  CL 101 104  CO2 25 32  GLUCOSE 191* 79  BUN 42* 46*  CREATININE 2.17* 2.39*   No results found for this basename: TROPONINI, CK, MB,  in the last 72 hours Hepatic Function Panel  Recent Labs  11/17/13 1052  PROT 7.9  ALBUMIN 2.8*  AST 26  ALT 11  ALKPHOS 101  BILITOT 0.2*   No results found for this basename: CHOL,  in the last 72 hours No results found for this basename: PROTIME,  in the last 72  hours  Imaging: Imaging results have been reviewed and Dg Chest Port 1 View  11/17/2013   CLINICAL DATA:  Shortness of breath.  EXAM: PORTABLE CHEST - 1 VIEW  COMPARISON:  Chest x-rays dated 11/10/2013 and 01/25/2012  FINDINGS: There is chronic cardiomegaly. Bilateral infiltrates have improved since the prior study. Density at the right base suggests a small effusion. Pulmonary vascularity remains slightly prominent. No acute osseous abnormality.  IMPRESSION: Improving bilateral pulmonary infiltrates. Decreased small right effusion.   Electronically Signed   By: Geanie Cooley M.D.   On: 11/17/2013 11:17    Cardiac Studies:  Assessment/Plan:  Resolving Acute on chronic decompensated diastolic heart failure  Resolving Acute on chronic respiratory failure secondary to above  Hypertension  History of recurrent A. 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

## 2013-11-18 NOTE — Progress Notes (Signed)
CSW received call from [facility] rep Larita Fife [phone]), who was returning CSW voicemail from yesterday. [employer] rep asked that CSW remind SNF to submit clinicals to [facility] for insurance auth, as some facilities have not been doing this promptly when they receive a pt from the hospital. CSW will remind facility to submit clinicals to [facility] for auth when pt is ready for discharge to SNF.   Maryclare Labrador, MSW, [facility] Clinical Social Worker [phone]

## 2013-11-18 NOTE — Progress Notes (Addendum)
[facility] Progress Note [place] TEAM 1 - Stepdown/ICU TEAM   [name] WGN:[id number] DOB: 12-31-1950 DOA: 11/17/2013 PCP: Willey Blade, MD  Brief narrative: This is a 62 year old female with a past medical history of congestive heart failure, paroxysmal atrial fibrillation not on anticoagulation, COPD/asthma, CK D. stage IV, insulin requiring diabetes mellitus, obesity hypoventilation syndrome and obstructive sleep apnea with noncompliance with CPAP. The patient was admitted earlier this month with respiratory failure suspected to be multifactorial from her underlying comorbidities and she was discharged back to a nursing facility on 12/9. She returned to the hospital on 12/10 once again short of breath. After thorough evaluation it was determined that the patient was having an acute exacerbation of chronic heart failure. She was admitted to be appropriately diuresed. Her cardiologist, Dr. Sharyn Lull, has also been consulted.  Assessment/Plan: Principal Problem:   Acute respiratory failure - improving with diuretics - O2 requirements down to 4 L which is her baseline  Active Problems:    Acute combined systolic and diastolic heart failure - cont aggressive diuresis- negative balance by 2.5 L    OBSTRUCTIVE SLEEP APNEA/  OSA (obstructive sleep apnea) - refused CPAP as recent as a few days ago prior to d/c - have advised her to use it tonight    HYPERTENSION - exacerbating heart failure - BP noted to be elevated on Hydralazine, Norvasc and Lopressor-  - HR to low to tolerated increase in B Blocker or CCB - will increase hydralazine today     DM (diabetes mellitus), type 2 with renal complications - last A1c 5.4 on 11/5 and lantus recently decreased due to hypoglycemia in the hospital  - cont with current treatment     CKD (chronic kidney disease), stage IV - Cr slightly higher than normal but not significant    PAF (paroxysmal atrial fibrillation) - currently  sinus bradycardia -Due to a  recent left leg hematoma which needed surgical evacuation, anticoagulation has been on hold    Unspecified constipation/ abdominal pain - exacerbated by oral iron? - now with central/ peri-umbilical abd pain and loose stools - abd xray without abnormality - received enema last week.  - recent nausea during last admission- Dr Darnelle Catalan questioning gastroparesis- was discharged with Reglan  - if stools continue to be loose, will need to d/c Reglan - will follow for recurrence of nausea  -Lipase on 12/8 was normal- will not check lipase again  Anemia  - due to chronic disease - However, also has low normal folate and B12 levels- will start replacing - also on oral Iron replacement  - will likely need a dose of Aranesp prior to d/c    Gout - cont allopurinal    Obesity - Morbid   Code Status: Full code Family Communication: none Disposition Plan: SNF at d/c- transfer to tele in AM if she remains stable through the night  Consultants: Cardiology  Procedures: none  Antibiotics: none  DVT prophylaxis: Heparin  HPI/Subjective: Pt breathing better but not back to baseline. Has had 2 loose stools since being re-admitted. C/o central abd pain. No nausea today.    Objective: Blood pressure 164/65, pulse 55, temperature 98.9 F (37.2 C), temperature source Oral, resp. rate 26, height 5\' 3"  (1.6 m), weight 123 kg (271 lb 2.7 oz), SpO2 93.00%.  Intake/Output Summary (Last 24 hours) at 11/18/13 1414 Last data filed at 11/18/13 1200  Gross per 24 hour  Intake    770 ml  Output   2900 ml  Net  -2130 ml     Exam: General: Morbidly obese female laying in bed,  No acute respiratory distress Lungs: Mild Crackles in RLL Cardiovascular: Regular rate and rhythm without murmur gallop or rub normal S1 and S2 Abdomen: mild tenderness to right of umbilicus, nondistended, soft, bowel sounds positive, no rebound, no ascites, no appreciable mass Extremities: No  significant cyanosis, clubbing, or edema bilateral lower extremities  Data Reviewed: Basic Metabolic Panel:  Recent Labs Lab 11/13/13 0620 11/14/13 0358 11/15/13 0415 11/17/13 1052 11/18/13 0457  NA 136 135 136 140 144  K 4.9 4.5 4.5 4.6 5.1  CL 100 99 100 101 104  CO2 31 31 31 25  32  GLUCOSE 69* 110* 88 191* 79  BUN 49* 54* 50* 42* 46*  CREATININE 2.60* 2.61* 2.49* 2.17* 2.39*  CALCIUM 8.4 8.2* 8.4 8.8 8.8   Liver Function Tests:  Recent Labs Lab 11/17/13 1052  AST 26  ALT 11  ALKPHOS 101  BILITOT 0.2*  PROT 7.9  ALBUMIN 2.8*   No results found for this basename: LIPASE, AMYLASE,  in the last 168 hours No results found for this basename: AMMONIA,  in the last 168 hours CBC:  Recent Labs Lab 11/14/13 0358 11/15/13 0415 11/17/13 1052 11/17/13 1211 11/18/13 0457  WBC 6.8 7.4 14.5* 14.8* 6.8  NEUTROABS  --   --  12.1* 12.3* 5.4  HGB 7.2* 7.2* 8.9* 8.8* 7.5*  HCT 24.7* 24.1* 29.5* 30.4* 25.6*  MCV 88.5 88.0 89.4 91.3 89.5  PLT 217 205 324 320 239   Cardiac Enzymes: No results found for this basename: CKTOTAL, CKMB, CKMBINDEX, TROPONINI,  in the last 168 hours BNP (last 3 results)  Recent Labs  10/13/13 2041 11/09/13 1605 11/17/13 1052  PROBNP 2342.0* 3130.0* 2548.0*   CBG:  Recent Labs Lab 11/16/13 1139 11/16/13 1639 11/17/13 1620 11/17/13 2138 11/18/13 0756  GLUCAP 128* 93 86 88 76       Studies:  Recent x-ray studies have been reviewed in detail by the Attending Physician  Scheduled Meds:  Scheduled Meds: . allopurinol  100 mg Oral Q breakfast  . amiodarone  200 mg Oral Q breakfast  . amLODipine  10 mg Oral Q breakfast  . aspirin  325 mg Oral Daily  . atorvastatin  20 mg Oral QHS  . cyanocobalamin  1,000 mcg Subcutaneous Daily  . docusate sodium  100 mg Oral BID  . ferrous sulfate  325 mg Oral TID WC  . folic acid  1 mg Intravenous Daily  . furosemide  80 mg Intravenous BID  . heparin  5,000 Units Subcutaneous Q8H  .  hydrALAZINE  50 mg Oral TID  . insulin aspart  0-9 Units Subcutaneous TID WC  . insulin glargine  15 Units Subcutaneous BID  . levalbuterol  0.63 mg Nebulization QID  . metoCLOPramide  5 mg Oral TID AC  . metoprolol tartrate  12.5 mg Oral BID  . pantoprazole  40 mg Oral Daily  . potassium chloride  20 mEq Oral Daily  . senna  2 tablet Oral Daily  . sodium chloride  3 mL Intravenous Q12H  . sodium chloride  3 mL Intravenous Q12H  . traZODone  25 mg Oral QHS   Continuous Infusions:   Time spent on care of this patient: 35 min   Domonique Cothran, MD  [facility] Office  [phone] Pager - Text Page per Loretha Stapler as per below:  On-Call/Text Page:      [email or web address]  password [email or web address]  If 7PM-7AM, please contact night-coverage [email or web address] Password [email or web address] 11/18/2013, 2:14 PM   LOS: 1 day

## 2013-11-18 NOTE — Progress Notes (Signed)
[name], admissions rep from [employer], states the facility is sending information for insurance auth from [place].   [name], MSW, [facility] Clinical Social Worker [phone]

## 2013-11-19 DIAGNOSIS — D638 Anemia in other chronic diseases classified elsewhere: Secondary | ICD-10-CM

## 2013-11-19 DIAGNOSIS — G4733 Obstructive sleep apnea (adult) (pediatric): Secondary | ICD-10-CM

## 2013-11-19 LAB — BASIC METABOLIC PANEL
BUN: 52 mg/dL — ABNORMAL HIGH (ref 6–23)
CO2: 34 mEq/L — ABNORMAL HIGH (ref 19–32)
Calcium: 8.5 mg/dL (ref 8.4–10.5)
Creatinine, Ser: 2.54 mg/dL — ABNORMAL HIGH (ref 0.50–1.10)
GFR calc Af Amer: 22 mL/min — ABNORMAL LOW (ref >90)
GFR calc non Af Amer: 19 mL/min — ABNORMAL LOW (ref >90)
Glucose, Bld: 73 mg/dL (ref 70–99)
Potassium: 4.8 mEq/L (ref 3.5–5.1)

## 2013-11-19 LAB — CBC
HCT: 25.5 % — ABNORMAL LOW (ref 36.0–46.0)
MCHC: 29.4 g/dL — ABNORMAL LOW (ref 30.0–36.0)
RDW: 17.4 % — ABNORMAL HIGH (ref 11.5–15.5)

## 2013-11-19 LAB — GLUCOSE, CAPILLARY: Glucose-Capillary: 89 mg/dL (ref 70–99)

## 2013-11-19 MED ORDER — ISOSORBIDE DINITRATE 10 MG PO TABS
10.0000 mg | ORAL_TABLET | Freq: Two times a day (BID) | ORAL | Status: DC
  Administered 2013-11-19 – 2013-11-22 (×7): 10 mg via ORAL
  Filled 2013-11-19 (×8): qty 1

## 2013-11-19 MED ORDER — FOLIC ACID 1 MG PO TABS
1.0000 mg | ORAL_TABLET | Freq: Every day | ORAL | Status: DC
  Administered 2013-11-19 – 2013-11-22 (×4): 1 mg via ORAL
  Filled 2013-11-19 (×4): qty 1

## 2013-11-19 MED ORDER — METOPROLOL TARTRATE 25 MG PO TABS
25.0000 mg | ORAL_TABLET | Freq: Two times a day (BID) | ORAL | Status: DC
  Administered 2013-11-19 – 2013-11-22 (×6): 25 mg via ORAL
  Filled 2013-11-19 (×7): qty 1

## 2013-11-19 NOTE — Progress Notes (Signed)
[facility] Progress Note [place] TEAM 1 - Stepdown/ICU TEAM   [name] OZH:[id number] DOB: Dec 24, 1950 DOA: 11/17/2013 PCP: Willey Blade, MD  Brief narrative: 62 year old female with a past medical history of congestive heart failure, paroxysmal atrial fibrillation not on anticoagulation, COPD/asthma, CKD stage IV, insulin requiring diabetes mellitus, obesity hypoventilation syndrome and obstructive sleep apnea with noncompliance with CPAP.  The patient was admitted earlier this month with respiratory failure suspected to be multifactorial from her underlying comorbidities and she was discharged back to a nursing facility on 12/9. She returned to the hospital on 12/10 once again short of breath. After thorough evaluation it was determined that the patient was having an acute exacerbation of chronic heart failure. She was admitted to be diuresed. Her cardiologist, Dr. Sharyn Lull, was consulted.  Assessment/Plan:    Acute respiratory failure/Acute diastolic heart failure -last ECHO 10/2013: preserved LV fnx and grade 1 DD - improving with diuretics - O2 requirements down to 4 L which is her baseline -has a component of chronic dyspnea c/w today's sx's -has diuresed >5 liters since admit -wt on 12/10 was 125.2 kg today is 122 kg -BP not well controlled and given etiology of HF is diastolic dysfunction optimal BP control needed to compensate HF    OBSTRUCTIVE SLEEP APNEA/  OSA (obstructive sleep apnea) -reportedly refused CPAP as recent as a few days ago prior to d/c - did use last night    HYPERTENSION - exacerbating heart failure (see above) - BP noted to be elevated on Hydralazine, Norvasc and Lopressor-  - given bradycardia on 12/12 Lopressor was dc'd - Hydralazine was increased 12/11 so will add Nitrate 12/12 to further improve afterload reduction    DM type 2 with renal complications - last A1c 5.4 on 11/5 and lantus recently decreased due to hypoglycemia in the  hospital  - cont with current treatment     CKD (chronic kidney disease), stage IV - Cr climbing so consider lower Lasix dose if increased further 12/13    PAF (paroxysmal atrial fibrillation) - currently sinus bradycardia - due to a recent left leg hematoma which needed surgical evacuation, anticoagulation has been on hold    ??Constipation/ abdominal pain - exacerbated by oral iron? - previous central/ peri-umbilical abd pain and loose stools have resolved-  ?due to new Reglan - abd xray without abnormality - received enema last week.  - recent nausea during last admission - Dr Darnelle Catalan questioning gastroparesis- was discharged with Reglan  - Lipase on 12/8 was normal  Anemia  - due to chronic disease - has low normal folate and B12 levels/cont replete - also on oral Iron replacement  - will likely need a dose of Aranesp prior to d/c    Gout - cont allopurinal   Morbid Obesity - Body mass index is 47.58 kg/(m^2).  Code Status: Full code Family Communication: none Disposition Plan: SNF at d/c- Transfer to Telemetry - begin PT/OT - careful titration of diuretic   Consultants: Cardiology  Procedures: none  Antibiotics: none  DVT prophylaxis: SQ Heparin  HPI/Subjective: Patient states her respiratory status has improved but is not yet back to her baseline.  She denies chest pain fevers chills nausea vomiting or abdominal pain at this time.  Objective: Blood pressure 176/49, pulse 50, temperature 97.8 F (36.6 C), temperature source Oral, resp. rate 18, height 5\' 3"  (1.6 m), weight 268 lb 15.4 oz (122 kg), SpO2 98.00%.  Intake/Output Summary (Last 24 hours) at 11/19/13 1218 Last data filed  at 11/19/13 1000  Gross per 24 hour  Intake    720 ml  Output   2605 ml  Net  -1885 ml   Exam: General: Morbidly obese female laying in bed, no acute respiratory distress Lungs: decreased bases, few scattered basilar crackles, 4L Cardiovascular: Regular rate and rhythm without  murmur gallop or rub normal S1 and S2 Abdomen: mild tenderness to right of umbilicus, nondistended, soft, bowel sounds positive, no rebound, no ascites, no appreciable mass Extremities: No significant cyanosis, clubbing, or edema bilateral lower extremities  Data Reviewed: Basic Metabolic Panel:  Recent Labs Lab 11/14/13 0358 11/15/13 0415 11/17/13 1052 11/18/13 0457 11/19/13 0545  NA 135 136 140 144 144  K 4.5 4.5 4.6 5.1 4.8  CL 99 100 101 104 104  CO2 31 31 25  32 34*  GLUCOSE 110* 88 191* 79 73  BUN 54* 50* 42* 46* 52*  CREATININE 2.61* 2.49* 2.17* 2.39* 2.54*  CALCIUM 8.2* 8.4 8.8 8.8 8.5   Liver Function Tests:  Recent Labs Lab 11/17/13 1052  AST 26  ALT 11  ALKPHOS 101  BILITOT 0.2*  PROT 7.9  ALBUMIN 2.8*   CBC:  Recent Labs Lab 11/17/13 1052 11/17/13 1211 11/18/13 0457 11/18/13 1600 11/19/13 0545  WBC 14.5* 14.8* 6.8 8.0 6.4  NEUTROABS 12.1* 12.3* 5.4  --   --   HGB 8.9* 8.8* 7.5* 7.6* 7.5*  HCT 29.5* 30.4* 25.6* 26.3* 25.5*  MCV 89.4 91.3 89.5 89.2 89.5  PLT 324 320 239 257 250   BNP (last 3 results)  Recent Labs  10/13/13 2041 11/09/13 1605 11/17/13 1052  PROBNP 2342.0* 3130.0* 2548.0*   CBG:  Recent Labs Lab 11/18/13 0756 11/18/13 1123 11/18/13 1637 11/18/13 2122 11/19/13 0815  GLUCAP 76 97 124* 107* 77     Studies:  Recent x-ray studies have been reviewed in detail by the Attending Physician  Scheduled Meds:  Scheduled Meds: . allopurinol  100 mg Oral Q breakfast  . amiodarone  200 mg Oral Q breakfast  . amLODipine  10 mg Oral Q breakfast  . aspirin  325 mg Oral Daily  . atorvastatin  20 mg Oral QHS  . cyanocobalamin  1,000 mcg Subcutaneous Daily  . docusate sodium  100 mg Oral BID  . ferrous sulfate  325 mg Oral TID WC  . folic acid  1 mg Oral Daily  . furosemide  80 mg Intravenous BID  . heparin  5,000 Units Subcutaneous Q8H  . hydrALAZINE  100 mg Oral TID  . insulin aspart  0-9 Units Subcutaneous TID WC  .  insulin glargine  15 Units Subcutaneous BID  . isosorbide dinitrate  10 mg Oral BID  . levalbuterol  0.63 mg Nebulization QID  . metoCLOPramide  5 mg Oral TID AC  . pantoprazole  40 mg Oral Daily  . potassium chloride  20 mEq Oral Daily  . senna  2 tablet Oral Daily  . sodium chloride  3 mL Intravenous Q12H  . sodium chloride  3 mL Intravenous Q12H  . traZODone  25 mg Oral QHS    Time spent on care of this patient: 35 min   ELLIS,ALLISON L., ANP  [facility] Office  [phone] Pager - Text Page per Loretha Stapler as per below:  On-Call/Text Page:      [email or web address]      password [email or web address]  If 7PM-7AM, please contact night-coverage [email or web address] Password [email or web address] 11/19/2013, 12:18 PM   LOS: 2 days   I have personally examined this  patient and reviewed the entire database. I have reviewed the above note, made any necessary editorial changes, and agree with its content.  Cherene Altes, MD [facility]

## 2013-11-19 NOTE — Progress Notes (Signed)
Nurse placed patient on CPAP due to RT placing Aline. Patient tolerating well and no complications at this time.

## 2013-11-19 NOTE — Progress Notes (Signed)
Subjective:  Patient denies any chest pain. States breathing is improved. Appetite poor. Oxygenation improved now on nasal cannula  Objective:  Vital Signs in the last 24 hours: Temp:  [97.7 F (36.5 C)-98.4 F (36.9 C)] 97.7 F (36.5 C) (12/12 1634) Pulse Rate:  [48-68] 62 (12/12 1634) Resp:  [16-30] 22 (12/12 1634) BP: (147-176)/(39-52) 147/48 mmHg (12/12 1634) SpO2:  [93 %-100 %] 96 % (12/12 1634) FiO2 (%):  [40 %] 40 % (12/12 0500) Weight:  [121.8 kg (268 lb 8.3 oz)-122 kg (268 lb 15.4 oz)] 121.8 kg (268 lb 8.3 oz) (12/12 1634)  Intake/Output from previous day: 12/11 0701 - 12/12 0700 In: 840 [P.O.:840] Out: 3305 [Urine:3305] Intake/Output from this shift: Total I/O In: 540 [P.O.:540] Out: 2000 [Urine:2000]  Physical Exam: Neck: no adenopathy, no carotid bruit and supple, symmetrical, trachea midline Lungs: Decreased breath sound at bases with bibasilar rales Heart: regular rate and rhythm, S1, S2 normal and Soft systolic murmur and S3 gallop noted Abdomen: soft, non-tender; bowel sounds normal; no masses,  no organomegaly Extremities: No clubbing cyanosis 1+ edema with chronic dermatosis  Lab Results:  Recent Labs  11/18/13 1600 11/19/13 0545  WBC 8.0 6.4  HGB 7.6* 7.5*  PLT 257 250    Recent Labs  11/18/13 0457 11/19/13 0545  NA 144 144  K 5.1 4.8  CL 104 104  CO2 32 34*  GLUCOSE 79 73  BUN 46* 52*  CREATININE 2.39* 2.54*   No results found for this basename: TROPONINI, CK, MB,  in the last 72 hours Hepatic Function Panel  Recent Labs  11/17/13 1052  PROT 7.9  ALBUMIN 2.8*  AST 26  ALT 11  ALKPHOS 101  BILITOT 0.2*   No results found for this basename: CHOL,  in the last 72 hours No results found for this basename: PROTIME,  in the last 72 hours  Imaging: Imaging results have been reviewed and Dg Abd Portable 1v  11/18/2013   CLINICAL DATA:  Abdominal pain.  EXAM: PORTABLE ABDOMEN - 1 VIEW  COMPARISON:  12/30/2011 abdominal films.   11/17/2013 chest x-ray.  FINDINGS: Asymmetric airspace disease and cardiomegaly once again noted.  Nonspecific bowel gas pattern without plain film evidence of bowel obstruction.  The possibility of free intraperitoneal air cannot be assessed on a supine view.  Postsurgical changes lower lumbar spine.  IMPRESSION: Asymmetric airspace disease and cardiomegaly once again noted.  Nonspecific bowel gas pattern without plain film evidence of bowel obstruction.  The possibility of free intraperitoneal air cannot be assessed on a supine view.   Electronically Signed   By: Bridgett Larsson M.D.   On: 11/18/2013 13:22    Cardiac Studies:  Assessment/Plan:  Resolving Acute on chronic decompensated diastolic heart failure  Resolving Acute on chronic respiratory failure secondary to above  Hypertension  History of recurrent A. 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 Algie Coffer on call for weekend  LOS: 2 days     Robynn Pane 11/19/2013,  6:17 PM

## 2013-11-19 NOTE — Progress Notes (Signed)
Patient moved from ICU today wears cpap at home and a FFM. Patient requested to be placed between 11pm and 11:30pm. Will bring equipment at that time.

## 2013-11-20 LAB — BASIC METABOLIC PANEL
BUN: 49 mg/dL — ABNORMAL HIGH (ref 6–23)
CO2: 33 mEq/L — ABNORMAL HIGH (ref 19–32)
Chloride: 100 mEq/L (ref 96–112)
Creatinine, Ser: 2.48 mg/dL — ABNORMAL HIGH (ref 0.50–1.10)
GFR calc Af Amer: 23 mL/min — ABNORMAL LOW (ref >90)
GFR calc non Af Amer: 20 mL/min — ABNORMAL LOW (ref >90)
Sodium: 142 mEq/L (ref 135–145)

## 2013-11-20 LAB — GLUCOSE, CAPILLARY
Glucose-Capillary: 89 mg/dL (ref 70–99)
Glucose-Capillary: 83 mg/dL (ref 70–99)
Glucose-Capillary: 143 mg/dL — ABNORMAL HIGH (ref 70–99)
Glucose-Capillary: 97 mg/dL (ref 70–99)

## 2013-11-20 MED ORDER — FUROSEMIDE 40 MG PO TABS: 40.0000 mg | ORAL_TABLET | Freq: Two times a day (BID) | ORAL | Status: DC

## 2013-11-20 MED ORDER — LEVALBUTEROL HCL 0.63 MG/3ML IN NEBU
0.6300 mg | INHALATION_SOLUTION | Freq: Three times a day (TID) | RESPIRATORY_TRACT | Status: DC
  Administered 2013-11-20 – 2013-11-22 (×6): 0.63 mg via RESPIRATORY_TRACT
  Filled 2013-11-20 (×12): qty 3

## 2013-11-20 MED ORDER — FUROSEMIDE 40 MG PO TABS
40.0000 mg | ORAL_TABLET | Freq: Two times a day (BID) | ORAL | Status: DC
Start: 2013-11-21 — End: 2013-11-22
  Administered 2013-11-21 – 2013-11-22 (×3): 40 mg via ORAL
  Filled 2013-11-20 (×5): qty 1

## 2013-11-20 NOTE — Progress Notes (Signed)
[facility] PROGRESS NOTE Interim History: 62 year old female with a past medical history of congestive heart failure, paroxysmal atrial fibrillation not on anticoagulation, COPD/asthma, CKD stage IV, insulin requiring diabetes mellitus, obesity hypoventilation syndrome and obstructive sleep apnea with noncompliance with CPAP.  The patient was admitted earlier this month with respiratory failure suspected to be multifactorial from her underlying comorbidities and she was discharged back to a nursing facility on 12/9. She returned to the hospital on 12/10 once again short of breath. After thorough evaluation it was determined that the patient was having an acute exacerbation of chronic heart failure  Filed Weights   11/19/13 0419 11/19/13 1634 11/20/13 0429  Weight: 122 kg (268 lb 15.4 oz) 121.8 kg (268 lb 8.3 oz) 119.477 kg (263 lb 6.4 oz)        Intake/Output Summary (Last 24 hours) at 11/20/13 1006 Last data filed at 11/20/13 6962  Gross per 24 hour  Intake    660 ml  Output   3100 ml  Net  -2440 ml     Assessment/Plan: Acute respiratory failure due to Acute combined systolic and diastolic heart failure - last ECHO 10/2013: preserved LV fnx and grade 1 DD  - On IV lasix with good UOP.b-met pending. - O2 requirements down to 4 L which is her baseline  - Has a component of chronic dyspnea c/w today's sx's  - Has diuresed >5 liters since admit  - Wt on 12/10 was 125.2 -> 119.4 kg. Check orthostatics. - PT consult pending.  DM (diabetes mellitus), type 2 with renal complications - last A1c 5.4 on 11/5 and lantus recently decreased due to hypoglycemia in the hospital  - cont with current treatment   OBSTRUCTIVE SLEEP APNEA/ OSA (obstructive sleep apnea)  -reportedly refused CPAP as recent as a few days ago prior to d/c  - did use last night   HYPERTENSION  - exacerbating heart failure. - BP noted to be elevated on Hydralazine, Norvasc and Lopressor-  - given bradycardia  on 12/12 Lopressor was dc'd  - Hydralazine was increased 12/11 so will add Nitrate 12/12 to further improve afterload reduction    CKD (chronic kidney disease), stage IV  - [place]. 12.13.2014 b-met pending. - d/c lasix check a b-met. Resume lasix in am.  PAF (paroxysmal atrial fibrillation)  - currently sinus bradycardia  - due to a recent left leg hematoma which needed surgical evacuation, anticoagulation has been on hold   ??Constipation/ abdominal pain  - exacerbated by oral iron?  - previous central/ peri-umbilical abd pain and loose stools have resolved- ?due to new Reglan  - abd xray without abnormality  - recent nausea during last admission - Dr Darnelle Catalan questioning gastroparesis- was discharged with Reglan  - Lipase on 12/8 was normal   Morbid Obesity - Body mass index is 47.58 kg/(m^2).    Code Status: full Family Communication: none  Disposition Plan: SNF pt consult pending.   Consultants:  cards  Procedures: ECHO: none  Antibiotics:  None  HPI/Subjective: Want to go home. She does feels btter  Objective: Filed Vitals:   11/19/13 2347 11/20/13 0429 11/20/13 0749 11/20/13 0905  BP:  122/54    Pulse: 64 54 54   Temp:  98 F (36.7 C)    TempSrc:  Oral    Resp: 22 21    Height:      Weight:  119.477 kg (263 lb 6.4 oz)    SpO2: 96% 95%  98%     Exam:  General: Alert, awake, oriented x3, in no acute distress.  HEENT: No bruits, no goiter.  Heart: Regular rate and rhythm, without murmurs, rubs, gallops.  Lungs: Good air movement, bilateral air movement.  Abdomen: Soft, nontender, nondistended, positive bowel sounds.  Neuro: Grossly intact, nonfocal.   Data Reviewed: Basic Metabolic Panel:  Recent Labs Lab 11/14/13 0358 11/15/13 0415 11/17/13 1052 11/18/13 0457 11/19/13 0545  NA 135 136 140 144 144  K 4.5 4.5 4.6 5.1 4.8  CL 99 100 101 104 104  CO2 31 31 25  32 34*  GLUCOSE 110* 88 191* 79 73  BUN 54* 50* 42* 46* 52*  CREATININE  2.61* 2.49* 2.17* 2.39* 2.54*  CALCIUM 8.2* 8.4 8.8 8.8 8.5   Liver Function Tests:  Recent Labs Lab 11/17/13 1052  AST 26  ALT 11  ALKPHOS 101  BILITOT 0.2*  PROT 7.9  ALBUMIN 2.8*   No results found for this basename: LIPASE, AMYLASE,  in the last 168 hours No results found for this basename: AMMONIA,  in the last 168 hours CBC:  Recent Labs Lab 11/17/13 1052 11/17/13 1211 11/18/13 0457 11/18/13 1600 11/19/13 0545  WBC 14.5* 14.8* 6.8 8.0 6.4  NEUTROABS 12.1* 12.3* 5.4  --   --   HGB 8.9* 8.8* 7.5* 7.6* 7.5*  HCT 29.5* 30.4* 25.6* 26.3* 25.5*  MCV 89.4 91.3 89.5 89.2 89.5  PLT 324 320 239 257 250   Cardiac Enzymes: No results found for this basename: CKTOTAL, CKMB, CKMBINDEX, TROPONINI,  in the last 168 hours BNP (last 3 results)  Recent Labs  10/13/13 2041 11/09/13 1605 11/17/13 1052  PROBNP 2342.0* 3130.0* 2548.0*   CBG:  Recent Labs Lab 11/19/13 0815 11/19/13 1229 11/19/13 1616 11/19/13 2126 11/20/13 0614  GLUCAP 77 88 89 89 83    Recent Results (from the past 240 hour(s))  MRSA PCR SCREENING     Status: None   Collection Time    11/17/13  4:14 PM      Result Value Range Status   MRSA by PCR NEGATIVE  NEGATIVE Final   Comment:            The GeneXpert MRSA Assay (FDA     approved for NASAL specimens     only), is one component of a     comprehensive MRSA colonization     surveillance program. It is not     intended to diagnose MRSA     infection nor to guide or     monitor treatment for     MRSA infections.     Studies: Dg Abd Portable 1v  11/18/2013   CLINICAL DATA:  Abdominal pain.  EXAM: PORTABLE ABDOMEN - 1 VIEW  COMPARISON:  12/30/2011 abdominal films.  11/17/2013 chest x-ray.  FINDINGS: Asymmetric airspace disease and cardiomegaly once again noted.  Nonspecific bowel gas pattern without plain film evidence of bowel obstruction.  The possibility of free intraperitoneal air cannot be assessed on a supine view.  Postsurgical changes  lower lumbar spine.  IMPRESSION: Asymmetric airspace disease and cardiomegaly once again noted.  Nonspecific bowel gas pattern without plain film evidence of bowel obstruction.  The possibility of free intraperitoneal air cannot be assessed on a supine view.   Electronically Signed   By: Bridgett Larsson M.D.   On: 11/18/2013 13:22    Scheduled Meds: . allopurinol  100 mg Oral Q breakfast  . amiodarone  200 mg Oral Q breakfast  . amLODipine  10 mg Oral Q breakfast  .  aspirin  325 mg Oral Daily  . atorvastatin  20 mg Oral QHS  . cyanocobalamin  1,000 mcg Subcutaneous Daily  . docusate sodium  100 mg Oral BID  . ferrous sulfate  325 mg Oral TID WC  . folic acid  1 mg Oral Daily  . [START ON 11/21/2013] furosemide  40 mg Oral BID  . heparin  5,000 Units Subcutaneous Q8H  . hydrALAZINE  100 mg Oral TID  . insulin aspart  0-9 Units Subcutaneous TID WC  . insulin glargine  15 Units Subcutaneous BID  . isosorbide dinitrate  10 mg Oral BID  . levalbuterol  0.63 mg Nebulization QID  . metoCLOPramide  5 mg Oral TID AC  . metoprolol tartrate  25 mg Oral BID  . pantoprazole  40 mg Oral Daily  . potassium chloride  20 mEq Oral Daily  . senna  2 tablet Oral Daily  . traZODone  25 mg Oral QHS   Continuous Infusions:    Marinda Elk  [facility] Pager [phone] If 8PM-8AM, please contact night-coverage at [email or web address], password [facility] 11/20/2013, 10:06 AM  LOS: 3 days

## 2013-11-20 NOTE — Progress Notes (Signed)
Subjective:  Feeling better. Afebrile.  Objective:  Vital Signs in the last 24 hours: Temp:  [97.7 F (36.5 C)-98.2 F (36.8 C)] 98 F (36.7 C) (12/13 0429) Pulse Rate:  [51-68] 51 (12/13 1025) Cardiac Rhythm:  [-] Sinus bradycardia (12/13 0753) Resp:  [20-25] 21 (12/13 1025) BP: (122-167)/(39-54) 140/41 mmHg (12/13 1025) SpO2:  [95 %-98 %] 96 % (12/13 1025) Weight:  [119.477 kg (263 lb 6.4 oz)-121.8 kg (268 lb 8.3 oz)] 119.477 kg (263 lb 6.4 oz) (12/13 0429)  Physical Exam: BP Readings from Last 1 Encounters:  11/20/13 140/41     Wt Readings from Last 1 Encounters:  11/20/13 119.477 kg (263 lb 6.4 oz)    Weight change: -0.2 kg (-7.1 oz)  HEENT: [place]/AT, Eyes-Brown, PERL, EOMI, Conjunctiva-Pale, Sclera-Non-icteric Neck: No JVD, No bruit, Trachea midline. Lungs:  Clearing, Bilateral. Cardiac:  Regular rhythm, normal S1 and S2, no S3.  Abdomen:  Soft, non-tender. Extremities:  Trace edema present. Significant lower leg eczema. No cyanosis. No clubbing. CNS: AxOx3, Cranial nerves grossly intact, moves all 4 extremities. Right handed. Skin: Warm and dry.   Intake/Output from previous day: 12/12 0701 - 12/13 0700 In: 660 [P.O.:660] Out: 3700 [Urine:3700]    Lab Results: BMET    Component Value Date/Time   NA 144 11/19/2013 0545   K 4.8 11/19/2013 0545   CL 104 11/19/2013 0545   CO2 34* 11/19/2013 0545   GLUCOSE 73 11/19/2013 0545   BUN 52* 11/19/2013 0545   CREATININE 2.54* 11/19/2013 0545   CREATININE 2.65* 07/01/2013 1108   CALCIUM 8.5 11/19/2013 0545   GFRNONAA 19* 11/19/2013 0545   GFRAA 22* 11/19/2013 0545   CBC    Component Value Date/Time   WBC 6.4 11/19/2013 0545   RBC 2.85* 11/19/2013 0545   RBC 3.33* 07/06/2013 0842   HGB 7.5* 11/19/2013 0545   HCT 25.5* 11/19/2013 0545   PLT 250 11/19/2013 0545   MCV 89.5 11/19/2013 0545   MCH 26.3 11/19/2013 0545   MCHC 29.4* 11/19/2013 0545   RDW 17.4* 11/19/2013 0545   LYMPHSABS 0.8 11/18/2013 0457   MONOABS  0.4 11/18/2013 0457   EOSABS 0.2 11/18/2013 0457   BASOSABS 0.0 11/18/2013 0457   CARDIAC ENZYMES Lab Results  Component Value Date   CKTOTAL 56 01/23/2012   CKMB 2.3 01/23/2012   TROPONINI <0.30 11/09/2013    Scheduled Meds: . allopurinol  100 mg Oral Q breakfast  . amiodarone  200 mg Oral Q breakfast  . amLODipine  10 mg Oral Q breakfast  . aspirin  325 mg Oral Daily  . atorvastatin  20 mg Oral QHS  . cyanocobalamin  1,000 mcg Subcutaneous Daily  . docusate sodium  100 mg Oral BID  . ferrous sulfate  325 mg Oral TID WC  . folic acid  1 mg Oral Daily  . [START ON 11/21/2013] furosemide  40 mg Oral BID  . heparin  5,000 Units Subcutaneous Q8H  . hydrALAZINE  100 mg Oral TID  . insulin aspart  0-9 Units Subcutaneous TID WC  . insulin glargine  15 Units Subcutaneous BID  . isosorbide dinitrate  10 mg Oral BID  . levalbuterol  0.63 mg Nebulization QID  . metoCLOPramide  5 mg Oral TID AC  . metoprolol tartrate  25 mg Oral BID  . pantoprazole  40 mg Oral Daily  . potassium chloride  20 mEq Oral Daily  . senna  2 tablet Oral Daily  . traZODone  25 mg Oral QHS  Continuous Infusions:  PRN Meds:.acetaminophen, acetaminophen, diphenhydrAMINE, guaiFENesin-dextromethorphan, ondansetron (ZOFRAN) IV, ondansetron, oxyCODONE-acetaminophen  Assessment/Plan: Resolving Acute on chronic decompensated diastolic heart failure  Resolving Acute on chronic respiratory failure secondary to above  Hypertension  History of recurrent A. 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    LOS: 3 days    Orpah Cobb  MD  11/20/2013, 11:47 AM

## 2013-11-20 NOTE — Progress Notes (Signed)
Pt HR drop to 39 while sleeping then back in 40s. Pt been in 40s since receiving BB this AM (oked by MD).  HR alarm set to 40. Will continue to monitor

## 2013-11-20 NOTE — Progress Notes (Signed)
OT Cancellation Note  Patient Details Name: [name] MRN: [id number] DOB: 12-20-1950   Cancelled Treatment:    Reason Eval/Treat Not Completed: Patient declined, stating, "Not today, let's do it tomorrow. I have not rested well and I worked with PT earlier". RN even tried to encourage her. Made pt aware it might be Monday before we see her.  Evette Georges [phone] 11/20/2013, 3:56 PM

## 2013-11-20 NOTE — Progress Notes (Signed)
Pt O4x, in bed eating. No complaints of pain or sob. Pt currently on 4L. Will continue to monitor

## 2013-11-20 NOTE — Progress Notes (Signed)
Pt walk 77ft w/ walk from bsc to bed. Pt breathing was labor, Pt became anxious after ambulating, Pt ask RN if she was going to die. RN confronted pt. Will continue to monitor

## 2013-11-20 NOTE — Evaluation (Signed)
Physical Therapy Evaluation Patient Details Name: [name] MRN: [id number] DOB: 1951/10/26 Today's Date: 11/20/2013 Time: 4098-1191 PT Time Calculation (min): 17 min  PT Assessment / Plan / Recommendation History of Present Illness  medical history of congestive heart failure, paroxysmal atrial fibrillation not on anticoagulation, COPD/asthma, CKD stage IV, insulin requiring diabetes mellitus, obesity hypoventilation syndrome and obstructive sleep apnea with noncompliance with CPAP.    Admitted now with exacerbation of CHF  Clinical Impression  Patient present with moderate deconditioning and requiring increased assistance with mobility.  Patient has been at [facility] for rehab and recommend patient return to SNF at this time for continued therapy.  Patient will benefit from PT to increase mobility and independence.    PT Assessment  Patient needs continued PT services    Follow Up Recommendations  SNF    Does the patient have the potential to tolerate intense rehabilitation      Barriers to Discharge        Equipment Recommendations  Rolling walker with 5" wheels;3in1 (PT)    Recommendations for Other Services     Frequency Min 3X/week    Precautions / Restrictions Precautions Precautions: Fall Precaution Comments: decreased activity tolerance. O2 dep   Pertinent Vitals/Pain Patient denies pain      Mobility  Bed Mobility Details for Bed Mobility Assistance: Pt OOB in recliner Transfers Transfers: Sit to Stand;Stand to Sit Sit to Stand: 4: Min assist;With upper extremity assist;From chair/3-in-1 Stand to Sit: 4: Min assist;To chair/3-in-1 Details for Transfer Assistance: required min assist to reach upright in sit - to - stand Ambulation/Gait Ambulation/Gait Assistance: 1: +2 Total assist Ambulation/Gait: Patient Percentage: 70% Ambulation Distance (Feet): 8 Feet Assistive device: Rolling walker Ambulation/Gait Assistance Details: ambulation limited by  SOB Gait Pattern: Step-to pattern;Decreased stride length;Decreased stance time - left;Antalgic;Trunk flexed Gait velocity: decreased    Exercises     PT Diagnosis: Difficulty walking;Generalized weakness  PT Problem List: Decreased strength;Decreased activity tolerance;Decreased balance;Decreased mobility;Obesity;Decreased knowledge of use of DME PT Treatment Interventions: DME instruction;Gait training;Functional mobility training;Therapeutic activities;Therapeutic exercise;Patient/family education     PT Goals(Current goals can be found in the care plan section) Acute Rehab PT Goals Patient Stated Goal: to get stronger so she can go home PT Goal Formulation: With patient Time For Goal Achievement: 12/04/13 Potential to Achieve Goals: Good  Visit Information  Last PT Received On: 11/20/13 Assistance Needed: +2 History of Present Illness: medical history of congestive heart failure, paroxysmal atrial fibrillation not on anticoagulation, COPD/asthma, CKD stage IV, insulin requiring diabetes mellitus, obesity hypoventilation syndrome and obstructive sleep apnea with noncompliance with CPAP.    Admitted now with exacerbation of CHF       Prior Functioning  Home Living Family/patient expects to be discharged to:: Skilled nursing facility Additional Comments: wants to return home eventually Prior Function Level of Independence: Needs assistance Gait / Transfers Assistance Needed: uses RW.  Walking very short distances at [facility] before admission. Comments: pt reports she needs to get stronger and more independent before she goes home with husband and 2 sons. Communication Communication: No difficulties    Cognition  Cognition Arousal/Alertness: Awake/alert Behavior During Therapy: WFL for tasks assessed/performed Overall Cognitive Status: Within Functional Limits for tasks assessed    Extremity/Trunk Assessment Upper Extremity Assessment Upper Extremity Assessment:  Generalized weakness Lower Extremity Assessment Lower Extremity Assessment: Generalized weakness Cervical / Trunk Assessment Cervical / Trunk Assessment: Normal   Balance Balance Balance Assessed: No  End of Session PT - End of  Session Equipment Utilized During Treatment: Oxygen;Gait belt Activity Tolerance: Patient limited by fatigue Patient left: in chair;with call bell/phone within reach  GP     Olivia Canter, [place] [id number] 11/20/2013, 2:46 PM

## 2013-11-21 LAB — BASIC METABOLIC PANEL
CO2: 32 mEq/L (ref 19–32)
Chloride: 102 mEq/L (ref 96–112)
Glucose, Bld: 91 mg/dL (ref 70–99)
Potassium: 4.8 mEq/L (ref 3.5–5.1)
Sodium: 143 mEq/L (ref 135–145)

## 2013-11-21 LAB — GLUCOSE, CAPILLARY
Glucose-Capillary: 106 mg/dL — ABNORMAL HIGH (ref 70–99)
Glucose-Capillary: 100 mg/dL — ABNORMAL HIGH (ref 70–99)
Glucose-Capillary: 79 mg/dL (ref 70–99)

## 2013-11-21 NOTE — Progress Notes (Signed)
[facility] PROGRESS NOTE Interim History: 62 year old female with a past medical history of congestive heart failure, paroxysmal atrial fibrillation not on anticoagulation, COPD/asthma, CKD stage IV, insulin requiring diabetes mellitus, obesity hypoventilation syndrome and obstructive sleep apnea with noncompliance with CPAP.  The patient was admitted earlier this month with respiratory failure suspected to be multifactorial from her underlying comorbidities and she was discharged back to a nursing facility on 12/9. She returned to the hospital on 12/10 once again short of breath. After thorough evaluation it was determined that the patient was having an acute exacerbation of chronic heart failure  Filed Weights   11/19/13 1634 11/20/13 0429 11/21/13 0516  Weight: 121.8 kg (268 lb 8.3 oz) 119.477 kg (263 lb 6.4 oz) 119.9 kg (264 lb 5.3 oz)        Intake/Output Summary (Last 24 hours) at 11/21/13 1208 Last data filed at 11/21/13 0900  Gross per 24 hour  Intake    600 ml  Output    425 ml  Net    175 ml     Assessment/Plan: Acute respiratory failure due to Acute combined systolic and diastolic heart failure - last ECHO 10/2013: preserved LV fnx and grade 1 DD  - On IV lasix with good UOP.b-met pending. - O2 requirements down to 4 L which is her baseline  - Has a component of chronic dyspnea c/w today's sx's  - Has diuresed >5 liters since admit  - Wt on 12/10 was 125.2 -> 119.4 kg. Check orthostatics. - PT consulted rec SNF. - awaiting SNF placement.  DM (diabetes mellitus), type 2 with renal complications - last A1c 5.4 on 11/5 and lantus recently decreased due to hypoglycemia in the hospital  - cont with current treatment   OBSTRUCTIVE SLEEP APNEA/ OSA (obstructive sleep apnea)  -reportedly refused CPAP as recent as a few days ago prior to d/c  - did use last night   HYPERTENSION  - exacerbating heart failure. - BP noted to be elevated on Hydralazine, Norvasc and  Lopressor-  - given bradycardia on 12/12 Lopressor was dc'd  - Hydralazine was increased 12/11 so will add Nitrate 12/12 to further improve afterload reduction   CKD (chronic kidney disease), stage IV  - Baseline 2.0. b-met pending. - d/c lasix check a b-met. Resume lasix in am.  PAF (paroxysmal atrial fibrillation)  - currently sinus bradycardia  - due to a recent left leg hematoma which needed surgical evacuation, anticoagulation has been on hold   ??Constipation/ abdominal pain  - exacerbated by oral iron. - previous central/ peri-umbilical abd pain and loose stools have resolved- ?due to new Reglan  - abd xray without abnormality  - recent nausea during last admission - Dr Darnelle Catalan questioning gastroparesis- was discharged with Reglan  - Lipase on 12/8 was normal   Morbid Obesity - Body mass index is 47.58 kg/(m^2).    Code Status: full Family Communication: none  Disposition Plan: SNF pt consult pending.   Consultants:  cards  Procedures: ECHO: none  Antibiotics:  None  HPI/Subjective: Want to go home. She does feels btter  Objective: Filed Vitals:   11/20/13 2040 11/21/13 0516 11/21/13 0826 11/21/13 1111  BP: 141/48 138/45  120/55  Pulse: 54 57    Temp: 98.1 F (36.7 C) 97.9 F (36.6 C)    TempSrc: Oral Oral    Resp: 20 20    Height:      Weight:  119.9 kg (264 lb 5.3 oz)    SpO2: 95%  97% 98%      Exam:  General: Alert, awake, oriented x3, in no acute distress.  HEENT: No bruits, no goiter.  Heart: Regular rate and rhythm, without murmurs, rubs, gallops.  Lungs: Good air movement, bilateral air movement.  Abdomen: Soft, nontender, nondistended, positive bowel sounds.  Neuro: Grossly intact, nonfocal.   Data Reviewed: Basic Metabolic Panel:  Recent Labs Lab 11/15/13 0415 11/17/13 1052 11/18/13 0457 11/19/13 0545 11/20/13 1140  NA 136 140 144 144 142  K 4.5 4.6 5.1 4.8 4.8  CL 100 101 104 104 100  CO2 31 25 32 34* 33*  GLUCOSE 88 191*  79 73 107*  BUN 50* 42* 46* 52* 49*  CREATININE 2.49* 2.17* 2.39* 2.54* 2.48*  CALCIUM 8.4 8.8 8.8 8.5 8.8   Liver Function Tests:  Recent Labs Lab 11/17/13 1052  AST 26  ALT 11  ALKPHOS 101  BILITOT 0.2*  PROT 7.9  ALBUMIN 2.8*   No results found for this basename: LIPASE, AMYLASE,  in the last 168 hours No results found for this basename: AMMONIA,  in the last 168 hours CBC:  Recent Labs Lab 11/17/13 1052 11/17/13 1211 11/18/13 0457 11/18/13 1600 11/19/13 0545  WBC 14.5* 14.8* 6.8 8.0 6.4  NEUTROABS 12.1* 12.3* 5.4  --   --   HGB 8.9* 8.8* 7.5* 7.6* 7.5*  HCT 29.5* 30.4* 25.6* 26.3* 25.5*  MCV 89.4 91.3 89.5 89.2 89.5  PLT 324 320 239 257 250   Cardiac Enzymes: No results found for this basename: CKTOTAL, CKMB, CKMBINDEX, TROPONINI,  in the last 168 hours BNP (last 3 results)  Recent Labs  10/13/13 2041 11/09/13 1605 11/17/13 1052  PROBNP 2342.0* 3130.0* 2548.0*   CBG:  Recent Labs Lab 11/20/13 1054 11/20/13 1654 11/20/13 2104 11/21/13 0607 11/21/13 1131  GLUCAP 143* 101* 97 81 106*    Recent Results (from the past 240 hour(s))  MRSA PCR SCREENING     Status: None   Collection Time    11/17/13  4:14 PM      Result Value Range Status   MRSA by PCR NEGATIVE  NEGATIVE Final   Comment:            The GeneXpert MRSA Assay (FDA     approved for NASAL specimens     only), is one component of a     comprehensive MRSA colonization     surveillance program. It is not     intended to diagnose MRSA     infection nor to guide or     monitor treatment for     MRSA infections.     Studies: No results found.  Scheduled Meds: . allopurinol  100 mg Oral Q breakfast  . amiodarone  200 mg Oral Q breakfast  . amLODipine  10 mg Oral Q breakfast  . aspirin  325 mg Oral Daily  . atorvastatin  20 mg Oral QHS  . cyanocobalamin  1,000 mcg Subcutaneous Daily  . docusate sodium  100 mg Oral BID  . ferrous sulfate  325 mg Oral TID WC  . folic acid  1 mg  Oral Daily  . furosemide  40 mg Oral BID  . heparin  5,000 Units Subcutaneous Q8H  . hydrALAZINE  100 mg Oral TID  . insulin aspart  0-9 Units Subcutaneous TID WC  . insulin glargine  15 Units Subcutaneous BID  . isosorbide dinitrate  10 mg Oral BID  . levalbuterol  0.63 mg Nebulization TID  . metoCLOPramide  5 mg Oral TID AC  . metoprolol tartrate  25 mg Oral BID  . pantoprazole  40 mg Oral Daily  . potassium chloride  20 mEq Oral Daily  . senna  2 tablet Oral Daily  . traZODone  25 mg Oral QHS   Continuous Infusions:    Marinda Elk  [facility] Pager [phone] If 8PM-8AM, please contact night-coverage at [email or web address], password [facility] 11/21/2013, 12:08 PM  LOS: 4 days

## 2013-11-21 NOTE — Progress Notes (Signed)
Needs attended . Pt resting comfortably in bed.

## 2013-11-21 NOTE — Progress Notes (Signed)
Subjective:  Had some anxiety post walk.  Objective:  Vital Signs in the last 24 hours: Temp:  [97.9 F (36.6 C)-98.7 F (37.1 C)] 97.9 F (36.6 C) (12/14 0516) Pulse Rate:  [50-57] 57 (12/14 0516) Cardiac Rhythm:  [-] Sinus bradycardia (12/13 2001) Resp:  [18-20] 20 (12/14 0516) BP: (120-141)/(37-49) 138/45 mmHg (12/14 0516) SpO2:  [95 %-98 %] 98 % (12/14 0826) Weight:  [119.9 kg (264 lb 5.3 oz)] 119.9 kg (264 lb 5.3 oz) (12/14 0516)  Physical Exam: BP Readings from Last 1 Encounters:  11/21/13 138/45     Wt Readings from Last 1 Encounters:  11/21/13 119.9 kg (264 lb 5.3 oz)    Weight change: -1.9 kg (-4 lb 3 oz)  HEENT: [place]/AT, Eyes-Brown, PERL, EOMI, Conjunctiva-Pale, Sclera-Non-icteric Neck: No JVD, No bruit, Trachea midline. Lungs:  Clearing, Bilateral. Cardiac:  Regular rhythm, normal S1 and S2, no S3.  Abdomen:  Soft, non-tender. Extremities:  Trace edema present. No cyanosis. No clubbing. Chronic exzema of lower legs, bilaterally. CNS: AxOx3, Cranial nerves grossly intact, moves all 4 extremities. Right handed. Skin: Warm and dry.   Intake/Output from previous day: 12/13 0701 - 12/14 0700 In: 720 [P.O.:720] Out: 350 [Urine:350]    Lab Results: BMET    Component Value Date/Time   NA 142 11/20/2013 1140   K 4.8 11/20/2013 1140   CL 100 11/20/2013 1140   CO2 33* 11/20/2013 1140   GLUCOSE 107* 11/20/2013 1140   BUN 49* 11/20/2013 1140   CREATININE 2.48* 11/20/2013 1140   CREATININE 2.65* 07/01/2013 1108   CALCIUM 8.8 11/20/2013 1140   GFRNONAA 20* 11/20/2013 1140   GFRAA 23* 11/20/2013 1140   CBC    Component Value Date/Time   WBC 6.4 11/19/2013 0545   RBC 2.85* 11/19/2013 0545   RBC 3.33* 07/06/2013 0842   HGB 7.5* 11/19/2013 0545   HCT 25.5* 11/19/2013 0545   PLT 250 11/19/2013 0545   MCV 89.5 11/19/2013 0545   MCH 26.3 11/19/2013 0545   MCHC 29.4* 11/19/2013 0545   RDW 17.4* 11/19/2013 0545   LYMPHSABS 0.8 11/18/2013 0457   MONOABS 0.4  11/18/2013 0457   EOSABS 0.2 11/18/2013 0457   BASOSABS 0.0 11/18/2013 0457   CARDIAC ENZYMES Lab Results  Component Value Date   CKTOTAL 56 01/23/2012   CKMB 2.3 01/23/2012   TROPONINI <0.30 11/09/2013    Scheduled Meds: . allopurinol  100 mg Oral Q breakfast  . amiodarone  200 mg Oral Q breakfast  . amLODipine  10 mg Oral Q breakfast  . aspirin  325 mg Oral Daily  . atorvastatin  20 mg Oral QHS  . cyanocobalamin  1,000 mcg Subcutaneous Daily  . docusate sodium  100 mg Oral BID  . ferrous sulfate  325 mg Oral TID WC  . folic acid  1 mg Oral Daily  . furosemide  40 mg Oral BID  . heparin  5,000 Units Subcutaneous Q8H  . hydrALAZINE  100 mg Oral TID  . insulin aspart  0-9 Units Subcutaneous TID WC  . insulin glargine  15 Units Subcutaneous BID  . isosorbide dinitrate  10 mg Oral BID  . levalbuterol  0.63 mg Nebulization TID  . metoCLOPramide  5 mg Oral TID AC  . metoprolol tartrate  25 mg Oral BID  . pantoprazole  40 mg Oral Daily  . potassium chloride  20 mEq Oral Daily  . senna  2 tablet Oral Daily  . traZODone  25 mg Oral QHS  Continuous Infusions:  PRN Meds:.acetaminophen, acetaminophen, diphenhydrAMINE, guaiFENesin-dextromethorphan, ondansetron (ZOFRAN) IV, ondansetron, oxyCODONE-acetaminophen  Assessment/Plan: Resolving Acute on chronic decompensated diastolic heart failure  Resolving Acute on chronic respiratory failure secondary to above  Hypertension  History of recurrent A. 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

## 2013-11-22 LAB — BASIC METABOLIC PANEL
BUN: 51 mg/dL — ABNORMAL HIGH (ref 6–23)
CO2: 31 mEq/L (ref 19–32)
Chloride: 101 mEq/L (ref 96–112)
Chloride: 101 mEq/L (ref 96–112)
Creatinine, Ser: 2.71 mg/dL — ABNORMAL HIGH (ref 0.50–1.10)
Creatinine, Ser: 2.72 mg/dL — ABNORMAL HIGH (ref 0.50–1.10)
GFR calc Af Amer: 20 mL/min — ABNORMAL LOW (ref >90)
Glucose, Bld: 117 mg/dL — ABNORMAL HIGH (ref 70–99)
Potassium: 5.2 mEq/L — ABNORMAL HIGH (ref 3.5–5.1)
Sodium: 141 mEq/L (ref 135–145)

## 2013-11-22 LAB — GLUCOSE, CAPILLARY
Glucose-Capillary: 119 mg/dL — ABNORMAL HIGH (ref 70–99)
Glucose-Capillary: 370 mg/dL — ABNORMAL HIGH (ref 70–99)
Glucose-Capillary: 129 mg/dL — ABNORMAL HIGH (ref 70–99)

## 2013-11-22 MED ORDER — ISOSORBIDE DINITRATE 10 MG PO TABS: 10.0000 mg | ORAL_TABLET | Freq: Two times a day (BID) | ORAL | Status: DC

## 2013-11-22 NOTE — Progress Notes (Signed)
Subjective:  Patient denies any chest pain or shortness of breath.  Objective:  Vital Signs in the last 24 hours: Temp:  [97.2 F (36.2 C)-97.9 F (36.6 C)] 97.2 F (36.2 C) (12/15 0539) Pulse Rate:  [52-59] 52 (12/15 1010) Resp:  [20] 20 (12/15 1010) BP: (127-145)/(40-59) 145/53 mmHg (12/15 1010) SpO2:  [93 %-99 %] 96 % (12/15 1010) Weight:  [118.4 kg (261 lb 0.4 oz)] 118.4 kg (261 lb 0.4 oz) (12/15 0539)  Intake/Output from previous day: 12/14 0701 - 12/15 0700 In: 480 [P.O.:480] Out: 826 [Urine:825; Stool:1] Intake/Output from this shift: Total I/O In: 240 [P.O.:240] Out: -   Physical Exam: Neck: no adenopathy, no carotid bruit, no JVD and supple, symmetrical, trachea midline Lungs: Decreased breath sound at bases Heart: regular rate and rhythm, S1, S2 normal and Soft systolic murmur noted no S3 gallop Abdomen: soft, non-tender; bowel sounds normal; no masses,  no organomegaly Extremities: No clubbing cyanosis trace edema with chronic dermatosis changes noted  Lab Results: No results found for this basename: WBC, HGB, PLT,  in the last 72 hours  Recent Labs  11/21/13 1000 11/22/13 0420  NA 143 141  K 4.8 5.2*  CL 102 101  CO2 32 29  GLUCOSE 91 57*  BUN 50* 52*  CREATININE 2.54* 2.71*   No results found for this basename: TROPONINI, CK, MB,  in the last 72 hours Hepatic Function Panel No results found for this basename: PROT, ALBUMIN, AST, ALT, ALKPHOS, BILITOT, BILIDIR, IBILI,  in the last 72 hours No results found for this basename: CHOL,  in the last 72 hours No results found for this basename: PROTIME,  in the last 72 hours  Imaging: Imaging results have been reviewed and No results found.  Cardiac Studies:  Assessment/Plan:  Compensated diastolic heart failure  Status post Acute on chronic respiratory failure secondary to above  Hypertension  History of recurrent A. fib flutter in the past status post TEE cardioversion in the past  History of  pontine CVA  Mild to moderate CAD  COPD  Obstructive sleep apnea/obesity hypoventilation syndrome  History of bronchial asthma  Morbid obesity  History of left knee septic arthritis status post total knee replacement  Chronic kidney disease stage IV  Abdominal pain questionable etiology  Acute on chronic anemia rule out GI loss  History of recent GI bleed and hematoma of right leg requiring skin grafting  Depression Plan Continue present management okay to transfer to skilled nursing facility followup with me in 2 weeks  LOS: 5 days    Kelseigh Diver N 11/22/2013, 11:47 AM

## 2013-11-22 NOTE — Discharge Summary (Signed)
Physician Discharge Summary  [name] ZOX:[id number] DOB: Jul 13, 1951 DOA: 11/17/2013  PCP: Willey Blade, MD  Admit date: 11/17/2013 Discharge date: 11/22/2013  Time spent: 35 minutes  Recommendations for Outpatient Follow-up:  1. Follow up with Cardiologist in 1 week.  BNP    Component Value Date/Time   PROBNP 2548.0* 11/17/2013 1052   Filed Weights   11/20/13 0429 11/21/13 0516 11/22/13 0539  Weight: 119.477 kg (263 lb 6.4 oz) 119.9 kg (264 lb 5.3 oz) 118.4 kg (261 lb 0.4 oz)     Discharge Diagnoses:  Principal Problem:   Acute respiratory failure Active Problems:   Acute combined systolic and diastolic heart failure   DM (diabetes mellitus), type 2 with renal complications   HYPERLIPIDEMIA   OBSTRUCTIVE SLEEP APNEA   HYPERTENSION   CKD (chronic kidney disease), stage IV   PAF (paroxysmal atrial fibrillation)   Wound of right leg   Chronic anemia   OSA (obstructive sleep apnea)   Discharge Condition: stable  Diet recommendation: heart healthy low sodium diet    History of present illness:  62 y.o. female who has resided at skilled nursing facility over the past 3 months, presents to the emergency room with shortness of breath. She was just discharged from [facility] for acute respiratory failure secondary to CHF, obesity hypoventilation syndrome, with noncompliance with CPAP, and a component of pneumonia. She has a history of obstructive sleep apnea and continues to refuse CPAP, although she is wearing it currently. In reviewing the last hospitalization, it appears that her weight on admission was 276 pounds. At discharge, was 278 pounds. She has an occasional cough which is nonproductive. She received antibiotics during the last hospitalization. She has had no fevers or chills. Her white blood cell count in the emergency room was 14,000. Chest x-ray today shows improved pulmonary edema. Patient and husband report that she still felt short of breath at  discharge and that she had nausea. She has no chest pain or palpitations. She has a history of paroxysmal atrial fibrillation and is not on anticoagulation du to bleeding complications including GI bleed and severe leg hematoma requiring surgery and skin grafting. Patient was reportedly hypoxic when EMS arrived at the facility. On arrival in the emergency room, she was on BiPAP, with a respiratory rate of 36 and shallow respirations. ABG showed on BiPAP showed a pH of 7.287. PCO2 58. PO2 68. She feels a little better currently. She has received 100 mg of IV Lasix. Last echocardiogram on 10/15/2013 showed an ejection fraction of 50-55%, grade 1 diastolic dysfunction, but previous dictations report mixed systolic and diastolic heart failure. Reviewing previous echoes show ejection fraction in the past of 40%. She is followed by Dr. Sharyn Lull, and was instructed to followup with him within a week, but he was not consulted during the last hospitalization.   Hospital Course:  Acute respiratory failure due to Acute combined systolic and diastolic heart failure  - last ECHO 10/2013: preserved LV fnx and grade 1 DD  - On IV lasix with good UOP.b-met pending.  - O2 requirements down to 4 L which is her baseline  - Has diuresed >5 liters since admit. Betablocker and isordil - Wt on 12/10 was 125.2 -> 119.4 kg.  - changed to oral lasix which she will cont. - PT consult rec. SNF.  DM (diabetes mellitus), type 2 with renal complications  - last A1c 5.4 on 11/5 and lantus recently decreased due to hypoglycemia in the hospital  -  cont with current regimen.  OBSTRUCTIVE SLEEP APNEA/ OSA (obstructive sleep apnea)  -reportedly refused CPAP as recent as a few days ago prior to d/c  - did use last night.  HYPERTENSION  - exacerbating heart failure.  - BP noted to be elevated on Hydralazine, Norvasc and Lopressor-  - given bradycardia on 12/12 Lopressor was dc'd  - Hydralazine was increased 12/11 so will add  Nitrate 12/12 to further improve afterload reduction    CKD (chronic kidney disease), stage IV  - [place]. 12.13.2014 b-met pending.  - d/c lasix check a b-met. Resume lasix in am.   PAF (paroxysmal atrial fibrillation)  - currently sinus bradycardia  - due to a recent left leg hematoma which needed surgical evacuation, anticoagulation has been on hold   ??Constipation/ abdominal pain  - exacerbated by oral iron?  - previous central/ peri-umbilical abd pain and loose stools have resolved- ?due to new Reglan  - abd xray without abnormality  - recent nausea during last admission - Dr Darnelle Catalan questioning gastroparesis- was discharged with Reglan  - Lipase on 12/8 was normal    Procedures:  ABd x-ray  CXR  Consultations:  PCCM  Discharge Exam: Filed Vitals:   11/22/13 1010  BP: 145/53  Pulse: 52  Temp:   Resp: 20    General: A&O x3 Cardiovascular: RRR Respiratory: good air movement CTA B/L  Discharge Instructions      Discharge Orders   Future Appointments Provider Department Dept Phone   01/06/2014 1:45 PM Randall Hiss, MD [facility] [phone]   Future Orders Complete By Expires   Diet - low sodium heart healthy  As directed    Increase activity slowly  As directed        Medication List         albuterol 108 (90 BASE) MCG/ACT inhaler  Commonly known as:  PROVENTIL HFA;VENTOLIN HFA  Inhale 2 puffs into the lungs every 6 (six) hours as needed for wheezing or shortness of breath.     albuterol (2.5 MG/3ML) 0.083% nebulizer solution  Commonly known as:  PROVENTIL  Take 2.5 mg by nebulization every 6 (six) hours as needed for wheezing or shortness of breath.     allopurinol 100 MG tablet  Commonly known as:  ZYLOPRIM  Take 100 mg by mouth daily. Patient takes at 0800am daily     amiodarone 200 MG tablet  Commonly known as:  PACERONE  Take 200 mg by mouth daily. Patient takes at 0800am daily      amLODipine 10 MG tablet  Commonly known as:  NORVASC  Take 10 mg by mouth daily. Patient takes at 0800am daily     aspirin 325 MG tablet  Take 325 mg by mouth daily.     atorvastatin 20 MG tablet  Commonly known as:  LIPITOR  Take 20 mg by mouth at bedtime. Patient takes at 2100     diphenhydrAMINE 25 mg capsule  Commonly known as:  BENADRYL  Take 25 mg by mouth every 4 (four) hours as needed for itching.     diphenhydrAMINE-zinc acetate cream  Commonly known as:  BENADRYL  Apply 1 application topically 3 (three) times daily as needed for itching.     docusate sodium 100 MG capsule  Commonly known as:  COLACE  Take 100 mg by mouth 2 (two) times daily.     ferrous sulfate 325 (65 FE) MG tablet  Take 325 mg by mouth  3 (three) times daily with meals. Patient takes at 0800, 1200, and 1700     furosemide 40 MG tablet  Commonly known as:  LASIX  Take 40 mg by mouth 2 (two) times daily. Patient takes at 0800am daily     hydrALAZINE 50 MG tablet  Commonly known as:  APRESOLINE  Take 50 mg by mouth 3 (three) times daily.     insulin glargine 100 UNIT/ML injection  Commonly known as:  LANTUS  Inject 15 Units into the skin 2 (two) times daily.     isosorbide dinitrate 10 MG tablet  Commonly known as:  ISORDIL  Take 1 tablet (10 mg total) by mouth 2 (two) times daily.     metoCLOPramide 5 MG tablet  Commonly known as:  REGLAN  Take 5 mg by mouth 3 (three) times daily.     metoprolol tartrate 25 MG tablet  Commonly known as:  LOPRESSOR  Take 12.5 mg by mouth 2 (two) times daily.     omeprazole 20 MG capsule  Commonly known as:  PRILOSEC  Take 20 mg by mouth daily.     ondansetron 4 MG tablet  Commonly known as:  ZOFRAN  Take 4 mg by mouth every 6 (six) hours as needed for nausea or vomiting.     oxyCODONE-acetaminophen 5-325 MG per tablet  Commonly known as:  PERCOCET/ROXICET  Take 2 tablets by mouth every 6 (six) hours as needed for severe pain.     pantoprazole 40  MG tablet  Commonly known as:  PROTONIX  Take 40 mg by mouth daily.     polyethylene glycol packet  Commonly known as:  MIRALAX / GLYCOLAX  Take 17 g by mouth 2 (two) times daily.     potassium chloride 20 MEQ packet  Commonly known as:  KLOR-CON  Take 20 mEq by mouth daily.     traZODone 50 MG tablet  Commonly known as:  DESYREL  Take 25 mg by mouth at bedtime.       Allergies  Allergen Reactions  . Daptomycin Other (See Comments)    Elevated CPK  . Morphine And Related Nausea And Vomiting  . Cephalexin Rash    unknown  . Celecoxib     Unknown, can use furosemide without issue  . Codeine     unknown  . Fluoxetine Hcl     unknown  . Latex     REACTION: undefined  . Ofloxacin     unknown  . Penicillins     unknown  . Rofecoxib     unknown  . Sulfonamide Derivatives     unknown  . Tramadol Nausea And Vomiting   Follow-up Information   Follow up with Ricki Rodriguez, MD On 11/30/2013. (@2 :30 spoke with [facility] )    Specialty:  Cardiology   Contact information:   [address] [place] [address] [phone]        The results of significant diagnostics from this hospitalization (including imaging, microbiology, ancillary and laboratory) are listed below for reference.    Significant Diagnostic Studies: Dg Chest Port 1 View  11/17/2013   CLINICAL DATA:  Shortness of breath.  EXAM: PORTABLE CHEST - 1 VIEW  COMPARISON:  Chest x-rays dated 11/10/2013 and 01/25/2012  FINDINGS: There is chronic cardiomegaly. Bilateral infiltrates have improved since the prior study. Density at the right base suggests a small effusion. Pulmonary vascularity remains slightly prominent. No acute osseous abnormality.  IMPRESSION: Improving bilateral pulmonary infiltrates. Decreased small right effusion.  Electronically Signed   By: Geanie Cooley M.D.   On: 11/17/2013 11:17   Dg Chest Port 1 View  11/10/2013   CLINICAL DATA:  Status post central line placement.  EXAM: PORTABLE  CHEST - 1 VIEW  COMPARISON:  Chest x-ray ofDecember 2, 2014.  FINDINGS: The lungs are adequately inflated. Confluent alveolar infiltrates are present bilaterally. These have not greatly changed since the study of earlier today. The cardiopericardial silhouette remains enlarged and the left hemidiaphragm remains obscured.  The right internal jugular venous catheter tip lies in the region of the proximal to mid SVC. There is no evidence of a procedure complication.  IMPRESSION: 1. There is no evidence of a postprocedure complication following placement of a right internal jugular venous catheter. 2. Widespread interstitial and alveolar densities are consistent with congestive heart failure and pulmonary edema.   Electronically Signed   By: David  [place]   On: 11/10/2013 12:40   Dg Chest Port 1 View  11/09/2013   CLINICAL DATA:  Shortness of Breath  EXAM: PORTABLE CHEST - 1 VIEW  COMPARISON:  10/14/2013  FINDINGS: Cardiomegaly again noted. Bilateral hazy airspace disease which may be due to interstitial edema or bilateral pneumonia.  IMPRESSION: Bilateral airspace disease which may be due to bilateral pneumonia or bilateral pulmonary edema. Cardiomegaly again noted.   Electronically Signed   By: Natasha Mead M.D.   On: 11/09/2013 15:28   Dg Abd Portable 1v  11/18/2013   CLINICAL DATA:  Abdominal pain.  EXAM: PORTABLE ABDOMEN - 1 VIEW  COMPARISON:  12/30/2011 abdominal films.  11/17/2013 chest x-ray.  FINDINGS: Asymmetric airspace disease and cardiomegaly once again noted.  Nonspecific bowel gas pattern without plain film evidence of bowel obstruction.  The possibility of free intraperitoneal air cannot be assessed on a supine view.  Postsurgical changes lower lumbar spine.  IMPRESSION: Asymmetric airspace disease and cardiomegaly once again noted.  Nonspecific bowel gas pattern without plain film evidence of bowel obstruction.  The possibility of free intraperitoneal air cannot be assessed on a supine view.    Electronically Signed   By: Bridgett Larsson M.D.   On: 11/18/2013 13:22    Microbiology: Recent Results (from the past 240 hour(s))  MRSA PCR SCREENING     Status: None   Collection Time    11/17/13  4:14 PM      Result Value Range Status   MRSA by PCR NEGATIVE  NEGATIVE Final   Comment:            The GeneXpert MRSA Assay (FDA     approved for NASAL specimens     only), is one component of a     comprehensive MRSA colonization     surveillance program. It is not     intended to diagnose MRSA     infection nor to guide or     monitor treatment for     MRSA infections.     Labs: Basic Metabolic Panel:  Recent Labs Lab 11/18/13 0457 11/19/13 0545 11/20/13 1140 11/21/13 1000 11/22/13 0420  NA 144 144 142 143 141  K 5.1 4.8 4.8 4.8 5.2*  CL 104 104 100 102 101  CO2 32 34* 33* 32 29  GLUCOSE 79 73 107* 91 57*  BUN 46* 52* 49* 50* 52*  CREATININE 2.39* 2.54* 2.48* 2.54* 2.71*  CALCIUM 8.8 8.5 8.8 8.7 8.6   Liver Function Tests:  Recent Labs Lab 11/17/13 1052  AST 26  ALT 11  ALKPHOS 101  BILITOT 0.2*  PROT 7.9  ALBUMIN 2.8*   No results found for this basename: LIPASE, AMYLASE,  in the last 168 hours No results found for this basename: AMMONIA,  in the last 168 hours CBC:  Recent Labs Lab 11/17/13 1052 11/17/13 1211 11/18/13 0457 11/18/13 1600 11/19/13 0545  WBC 14.5* 14.8* 6.8 8.0 6.4  NEUTROABS 12.1* 12.3* 5.4  --   --   HGB 8.9* 8.8* 7.5* 7.6* 7.5*  HCT 29.5* 30.4* 25.6* 26.3* 25.5*  MCV 89.4 91.3 89.5 89.2 89.5  PLT 324 320 239 257 250   Cardiac Enzymes: No results found for this basename: CKTOTAL, CKMB, CKMBINDEX, TROPONINI,  in the last 168 hours BNP: BNP (last 3 results)  Recent Labs  10/13/13 2041 11/09/13 1605 11/17/13 1052  PROBNP 2342.0* 3130.0* 2548.0*   CBG:  Recent Labs Lab 11/21/13 1559 11/21/13 2108 11/22/13 0611 11/22/13 0818 11/22/13 1104  GLUCAP 100* 79 69* 119* 84       Signed:  FELIZ ORTIZ, ABRAHAM  [facility] 11/22/2013, 11:15 AM

## 2013-11-22 NOTE — Progress Notes (Signed)
Physical Therapy Treatment Patient Details Name: [name] MRN: [id number] DOB: 14-Jan-1951 Today's Date: 11/22/2013 Time: 7829-5621 PT Time Calculation (min): 23 min  PT Assessment / Plan / Recommendation  History of Present Illness medical history of congestive heart failure, paroxysmal atrial fibrillation not on anticoagulation, COPD/asthma, CKD stage IV, insulin requiring diabetes mellitus, obesity hypoventilation syndrome and obstructive sleep apnea with noncompliance with CPAP.    Admitted now with exacerbation of CHF   PT Comments   Pt progressing with mobility but limited by fatigue. Pt with assist needed for transfers and gait with encouragement to continue HEP and increase mobility with nursing assist. Pt with sats dropping to 84% on 2L with short ambulation and recovered to 95% with 2 min seated rest on 4L, HR 72. Back on 2L end of session with sats 95%. No pain  Follow Up Recommendations  SNF     Does the patient have the potential to tolerate intense rehabilitation     Barriers to Discharge        Equipment Recommendations       Recommendations for Other Services    Frequency     Progress towards PT Goals Progress towards PT goals: Progressing toward goals  Plan Current plan remains appropriate    Precautions / Restrictions Precautions Precautions: Fall Precaution Comments: decreased activity tolerance. O2 dep Restrictions Weight Bearing Restrictions: No   Pertinent Vitals/Pain     Mobility  Bed Mobility Bed Mobility: Not assessed Details for Bed Mobility Assistance: Pt OOB in recliner Transfers Transfers: Stand Pivot Transfers Sit to Stand: 4: Min assist;With upper extremity assist;From chair/3-in-1 Stand to Sit: To chair/3-in-1;With armrests;4: Min guard Stand Pivot Transfers: 4: Min assist Details for Transfer Assistance: assist for complete anterior translation with cueing for hand placement and safety x 2 trials with short pivot 2-3 steps to [facility]  from recliner prior to walk Ambulation/Gait Ambulation/Gait Assistance: 4: Min guard Ambulation Distance (Feet): 20 Feet Assistive device: Rolling walker Ambulation/Gait Assistance Details: cueing for posture, limited by fatigue Gait Pattern: Step-through pattern;Decreased stride length;Trunk flexed Gait velocity: decreased Stairs: No    Exercises General Exercises - Lower Extremity Long Arc Quad: AROM;Seated;Both;20 reps Hip ABduction/ADduction: AROM;Seated;Both;20 reps Hip Flexion/Marching: AROM;Seated;20 reps;Both Toe Raises: AROM;Seated;Both;20 reps Heel Raises: AROM;Seated;Both;20 reps   PT Diagnosis:    PT Problem List:   PT Treatment Interventions:     PT Goals (current goals can now be found in the care plan section) Acute Rehab PT Goals Patient Stated Goal: to get stronger so she can go home  Visit Information  Last PT Received On: 11/22/13 Assistance Needed: +1 History of Present Illness: medical history of congestive heart failure, paroxysmal atrial fibrillation not on anticoagulation, COPD/asthma, CKD stage IV, insulin requiring diabetes mellitus, obesity hypoventilation syndrome and obstructive sleep apnea with noncompliance with CPAP.    Admitted now with exacerbation of CHF    Subjective Data  Patient Stated Goal: to get stronger so she can go home   Cognition  Cognition Arousal/Alertness: Awake/alert Behavior During Therapy: WFL for tasks assessed/performed Overall Cognitive Status: Within Functional Limits for tasks assessed    Balance  Balance Balance Assessed: Yes Static Sitting Balance Static Sitting - Balance Support: No upper extremity supported;Feet supported Static Sitting - Level of Assistance: 5: Stand by assistance Dynamic Sitting Balance Dynamic Sitting - Balance Support: Feet unsupported;No upper extremity supported;During functional activity Dynamic Sitting - Level of Assistance: 5: Stand by assistance Static Standing Balance Static  Standing - Balance Support: During functional activity;Bilateral  upper extremity supported Static Standing - Level of Assistance: 4: Min assist Dynamic Standing Balance Dynamic Standing - Balance Support: Right upper extremity supported;Left upper extremity supported;During functional activity Dynamic Standing - Level of Assistance: 4: Min assist;3: Mod assist  End of Session PT - End of Session Equipment Utilized During Treatment: Oxygen;Gait belt Activity Tolerance: Patient limited by fatigue Patient left: in chair;with call bell/phone within reach   GP     Delorse Lek 11/22/2013, 2:56 PM Delaney Meigs, PT [phone]

## 2013-11-22 NOTE — Progress Notes (Signed)
[facility] [phone]. Report called david

## 2013-11-22 NOTE — Evaluation (Signed)
Occupational Therapy Evaluation Patient Details Name: [name] MRN: [id number] DOB: 1951-06-15 Today's Date: 11/22/2013 Time: 7829-5621 OT Time Calculation (min): 21 min  OT Assessment / Plan / Recommendation History of present illness medical history of congestive heart failure, paroxysmal atrial fibrillation not on anticoagulation, COPD/asthma, CKD stage IV, insulin requiring diabetes mellitus, obesity hypoventilation syndrome and obstructive sleep apnea with noncompliance with CPAP.    Admitted now with exacerbation of CHF   Clinical Impression   Pt demos decline in function with ADLs and ADL mobility safety with decreased strength, balance and endurance. Pt would benefit from acute OT services to address impairments to increase level of function and safety    OT Assessment  Patient needs continued OT Services    Follow Up Recommendations  SNF;Supervision/Assistance - 24 hour (TBD at SNF level of care)    Barriers to Discharge Decreased caregiver support planning to d/c back to SNF for rehab  Equipment Recommendations  None recommended by OT;Other (comment)    Recommendations for Other Services    Frequency  Min 2X/week    Precautions / Restrictions Precautions Precautions: Fall Precaution Comments: decreased activity tolerance. O2 dep Restrictions Weight Bearing Restrictions: No   Pertinent Vitals/Pain No c/o pain    ADL  Grooming: Performed;Wash/dry hands;Wash/dry face;Minimal assistance Where Assessed - Grooming: Supported standing Upper Body Bathing: Simulated;Set up;Min guard Lower Body Bathing: Simulated;Maximal assistance Upper Body Dressing: Performed;Set up;Min guard Lower Body Dressing: +1 Total assistance Toilet Transfer: Performed;Minimal assistance Toilet Transfer Method: Sit to stand Toilet Transfer Equipment: Bedside commode Toileting - Clothing Manipulation and Hygiene: +1 Total assistance Tub/Shower Transfer Method: Not assessed Equipment  Used: Gait belt;Rolling walker;Other (comment) (BSC) ADL Comments: required min assist to reach upright in sit - to - stand    OT Diagnosis: Generalized weakness  OT Problem List: Decreased strength;Decreased activity tolerance;Impaired balance (sitting and/or standing) OT Treatment Interventions: Self-care/ADL training;Therapeutic exercise;Patient/family education;Neuromuscular education;Balance training;Therapeutic activities;DME and/or AE instruction   OT Goals(Current goals can be found in the care plan section) Acute Rehab OT Goals Patient Stated Goal: to get stronger so she can go home OT Goal Formulation: With patient Time For Goal Achievement: 11/29/13 Potential to Achieve Goals: Good ADL Goals Pt Will Perform Grooming: with min guard assist;standing Pt Will Perform Upper Body Bathing: with set-up;with supervision;sitting Pt Will Perform Lower Body Bathing: with mod assist;with min assist;sitting/lateral leans;sit to/from stand Pt Will Perform Upper Body Dressing: with supervision;with set-up;sitting Pt Will Transfer to Toilet: with min guard assist;with supervision;bedside commode;grab bars;ambulating;regular height toilet Pt Will Perform Toileting - Clothing Manipulation and hygiene: with max assist;with mod assist;sitting/lateral leans;sit to/from stand  Visit Information  Last OT Received On: 11/22/13 Assistance Needed: +1 History of Present Illness: medical history of congestive heart failure, paroxysmal atrial fibrillation not on anticoagulation, COPD/asthma, CKD stage IV, insulin requiring diabetes mellitus, obesity hypoventilation syndrome and obstructive sleep apnea with noncompliance with CPAP.    Admitted now with exacerbation of CHF       Prior Functioning     Home Living Family/patient expects to be discharged to:: Skilled nursing facility Additional Comments: wants to return home eventually Prior Function Level of Independence: Needs assistance Gait /  Transfers Assistance Needed: uses RW.  Walking very short distances at [facility] before admission. ADL's / Homemaking Assistance Needed: pt states that at home she requires assist for socks but is abe to bathe/dress herself. Requires assist with ADLs at SNF Communication Communication: No difficulties Dominant Hand: Left  Vision/Perception Vision - History Baseline Vision: Wears glasses all the time Patient Visual Report: No change from baseline Perception Perception: Within Functional Limits   Cognition  Cognition Arousal/Alertness: Awake/alert Behavior During Therapy: WFL for tasks assessed/performed Overall Cognitive Status: Within Functional Limits for tasks assessed    Extremity/Trunk Assessment Upper Extremity Assessment Upper Extremity Assessment: Overall WFL for tasks assessed;Generalized weakness Lower Extremity Assessment Lower Extremity Assessment: Defer to PT evaluation Cervical / Trunk Assessment Cervical / Trunk Assessment: Normal     Mobility Bed Mobility Bed Mobility: Not assessed Details for Bed Mobility Assistance: Pt OOB in recliner Transfers Transfers: Sit to Stand;Stand to Sit Sit to Stand: 4: Min assist;With upper extremity assist;From chair/3-in-1 Stand to Sit: 4: Min assist;To chair/3-in-1;With armrests Details for Transfer Assistance: required min assist to reach upright in sit - to - stand     Exercise     Balance Balance Balance Assessed: Yes Static Sitting Balance Static Sitting - Balance Support: No upper extremity supported;Feet supported Static Sitting - Level of Assistance: 5: Stand by assistance Dynamic Sitting Balance Dynamic Sitting - Balance Support: Feet unsupported;No upper extremity supported;During functional activity Dynamic Sitting - Level of Assistance: 5: Stand by assistance Static Standing Balance Static Standing - Balance Support: During functional activity;Bilateral upper extremity supported Static  Standing - Level of Assistance: 4: Min assist Dynamic Standing Balance Dynamic Standing - Balance Support: Right upper extremity supported;Left upper extremity supported;During functional activity Dynamic Standing - Level of Assistance: 4: Min assist;3: Mod assist   End of Session OT - End of Session Equipment Utilized During Treatment: Rolling walker;Gait belt;Other (comment) (BSC) Activity Tolerance: Patient limited by fatigue Patient left: in chair;with call bell/phone within reach;with family/visitor present  GO     Galen Manila 11/22/2013, 11:49 AM

## 2013-11-22 NOTE — Progress Notes (Signed)
Pt o4x, no complaints pain or SOB. Pt AM cbg 69, night RN gave OJ. But no recheck done. RN day ask NT to check CBG> will continue to monitor

## 2013-12-02 NOTE — Progress Notes (Signed)
OK per MD for d/c today back to [facility]. CSW received call from Admissions at [facility] that [facility] has been received.  OK per patient and husband for d/c back to the SNF today via EMS.  Nursing notified and called report.  No further CSW needs identified. CSW signing off.  Lorri Frederick. [place]  [phone]

## 2013-12-03 ENCOUNTER — Inpatient Hospital Stay (HOSPITAL_COMMUNITY)
Admission: EM | Admit: 2013-12-03 | Discharge: 2013-12-16 | DRG: 291 | Disposition: A | Discharge status: Skilled Nursing Facility | Payer: Medicare HMO | Attending: Family Medicine | Admitting: Family Medicine

## 2013-12-03 ENCOUNTER — Emergency Department (HOSPITAL_COMMUNITY): Payer: Medicare HMO

## 2013-12-03 ENCOUNTER — Encounter (HOSPITAL_COMMUNITY): Payer: Self-pay | Admitting: Emergency Medicine

## 2013-12-03 DIAGNOSIS — Z87891 Personal history of nicotine dependence: Secondary | ICD-10-CM

## 2013-12-03 DIAGNOSIS — Z79899 Other long term (current) drug therapy: Secondary | ICD-10-CM

## 2013-12-03 DIAGNOSIS — I2510 Atherosclerotic heart disease of native coronary artery without angina pectoris: Secondary | ICD-10-CM | POA: Diagnosis present

## 2013-12-03 DIAGNOSIS — Z91199 Patient's noncompliance with other medical treatment and regimen due to unspecified reason: Secondary | ICD-10-CM

## 2013-12-03 DIAGNOSIS — J9600 Acute respiratory failure, unspecified whether with hypoxia or hypercapnia: Secondary | ICD-10-CM

## 2013-12-03 DIAGNOSIS — G4733 Obstructive sleep apnea (adult) (pediatric): Secondary | ICD-10-CM | POA: Diagnosis present

## 2013-12-03 DIAGNOSIS — I5043 Acute on chronic combined systolic (congestive) and diastolic (congestive) heart failure: Principal | ICD-10-CM | POA: Diagnosis present

## 2013-12-03 DIAGNOSIS — N390 Urinary tract infection, site not specified: Secondary | ICD-10-CM | POA: Diagnosis present

## 2013-12-03 DIAGNOSIS — I5041 Acute combined systolic (congestive) and diastolic (congestive) heart failure: Secondary | ICD-10-CM

## 2013-12-03 DIAGNOSIS — Z8673 Personal history of transient ischemic attack (TIA), and cerebral infarction without residual deficits: Secondary | ICD-10-CM

## 2013-12-03 DIAGNOSIS — E1129 Type 2 diabetes mellitus with other diabetic kidney complication: Secondary | ICD-10-CM

## 2013-12-03 DIAGNOSIS — Z96659 Presence of unspecified artificial knee joint: Secondary | ICD-10-CM

## 2013-12-03 DIAGNOSIS — Z794 Long term (current) use of insulin: Secondary | ICD-10-CM

## 2013-12-03 DIAGNOSIS — E8809 Other disorders of plasma-protein metabolism, not elsewhere classified: Secondary | ICD-10-CM | POA: Diagnosis present

## 2013-12-03 DIAGNOSIS — Z7982 Long term (current) use of aspirin: Secondary | ICD-10-CM

## 2013-12-03 DIAGNOSIS — E669 Obesity, unspecified: Secondary | ICD-10-CM

## 2013-12-03 DIAGNOSIS — K219 Gastro-esophageal reflux disease without esophagitis: Secondary | ICD-10-CM | POA: Diagnosis present

## 2013-12-03 DIAGNOSIS — M109 Gout, unspecified: Secondary | ICD-10-CM | POA: Diagnosis present

## 2013-12-03 DIAGNOSIS — F329 Major depressive disorder, single episode, unspecified: Secondary | ICD-10-CM | POA: Diagnosis present

## 2013-12-03 DIAGNOSIS — J4489 Other specified chronic obstructive pulmonary disease: Secondary | ICD-10-CM | POA: Diagnosis present

## 2013-12-03 DIAGNOSIS — N179 Acute kidney failure, unspecified: Secondary | ICD-10-CM | POA: Diagnosis present

## 2013-12-03 DIAGNOSIS — E870 Hyperosmolality and hypernatremia: Secondary | ICD-10-CM | POA: Diagnosis not present

## 2013-12-03 DIAGNOSIS — J189 Pneumonia, unspecified organism: Secondary | ICD-10-CM

## 2013-12-03 DIAGNOSIS — I509 Heart failure, unspecified: Secondary | ICD-10-CM | POA: Diagnosis present

## 2013-12-03 DIAGNOSIS — Z9119 Patient's noncompliance with other medical treatment and regimen: Secondary | ICD-10-CM

## 2013-12-03 DIAGNOSIS — D649 Anemia, unspecified: Secondary | ICD-10-CM

## 2013-12-03 DIAGNOSIS — I4891 Unspecified atrial fibrillation: Secondary | ICD-10-CM | POA: Diagnosis present

## 2013-12-03 DIAGNOSIS — Z6841 Body Mass Index (BMI) 40.0 and over, adult: Secondary | ICD-10-CM

## 2013-12-03 DIAGNOSIS — I10 Essential (primary) hypertension: Secondary | ICD-10-CM | POA: Diagnosis present

## 2013-12-03 DIAGNOSIS — J449 Chronic obstructive pulmonary disease, unspecified: Secondary | ICD-10-CM | POA: Diagnosis present

## 2013-12-03 DIAGNOSIS — I129 Hypertensive chronic kidney disease with stage 1 through stage 4 chronic kidney disease, or unspecified chronic kidney disease: Secondary | ICD-10-CM | POA: Diagnosis present

## 2013-12-03 DIAGNOSIS — I2789 Other specified pulmonary heart diseases: Secondary | ICD-10-CM | POA: Diagnosis present

## 2013-12-03 DIAGNOSIS — E785 Hyperlipidemia, unspecified: Secondary | ICD-10-CM | POA: Diagnosis present

## 2013-12-03 DIAGNOSIS — J9602 Acute respiratory failure with hypercapnia: Secondary | ICD-10-CM

## 2013-12-03 DIAGNOSIS — R0602 Shortness of breath: Secondary | ICD-10-CM

## 2013-12-03 DIAGNOSIS — A498 Other bacterial infections of unspecified site: Secondary | ICD-10-CM | POA: Diagnosis present

## 2013-12-03 DIAGNOSIS — N058 Unspecified nephritic syndrome with other morphologic changes: Secondary | ICD-10-CM

## 2013-12-03 DIAGNOSIS — F3289 Other specified depressive episodes: Secondary | ICD-10-CM | POA: Diagnosis present

## 2013-12-03 DIAGNOSIS — Z9981 Dependence on supplemental oxygen: Secondary | ICD-10-CM

## 2013-12-03 DIAGNOSIS — E662 Morbid (severe) obesity with alveolar hypoventilation: Secondary | ICD-10-CM | POA: Diagnosis present

## 2013-12-03 DIAGNOSIS — F411 Generalized anxiety disorder: Secondary | ICD-10-CM | POA: Diagnosis present

## 2013-12-03 DIAGNOSIS — D72829 Elevated white blood cell count, unspecified: Secondary | ICD-10-CM | POA: Diagnosis present

## 2013-12-03 DIAGNOSIS — D638 Anemia in other chronic diseases classified elsewhere: Secondary | ICD-10-CM

## 2013-12-03 DIAGNOSIS — N184 Chronic kidney disease, stage 4 (severe): Secondary | ICD-10-CM | POA: Diagnosis present

## 2013-12-03 LAB — POCT I-STAT 3, ART BLOOD GAS (G3+)
Acid-base deficit: 6 mmol/L — ABNORMAL HIGH (ref 0.0–2.0)
Bicarbonate: 21.4 mEq/L (ref 20.0–24.0)
pH, Arterial: 7.206 — ABNORMAL LOW (ref 7.350–7.450)

## 2013-12-03 LAB — URINALYSIS, ROUTINE W REFLEX MICROSCOPIC
Bilirubin Urine: NEGATIVE
Bilirubin Urine: NEGATIVE
Glucose, UA: NEGATIVE mg/dL
Glucose, UA: NEGATIVE mg/dL
Hgb urine dipstick: NEGATIVE
Ketones, ur: NEGATIVE mg/dL
Ketones, ur: NEGATIVE mg/dL
Leukocytes, UA: NEGATIVE
Nitrite: NEGATIVE
Protein, ur: 100 mg/dL — AB
Specific Gravity, Urine: 1.019 (ref 1.005–1.030)
Specific Gravity, Urine: 1.016 (ref 1.005–1.030)
Urobilinogen, UA: 0.2 mg/dL (ref 0.0–1.0)
Urobilinogen, UA: 0.2 mg/dL (ref 0.0–1.0)
pH: 5 (ref 5.0–8.0)

## 2013-12-03 LAB — POCT I-STAT TROPONIN I: Troponin i, poc: 0 ng/mL (ref 0.00–0.08)

## 2013-12-03 LAB — GLUCOSE, CAPILLARY: Glucose-Capillary: 91 mg/dL (ref 70–99)

## 2013-12-03 LAB — URINE MICROSCOPIC-ADD ON

## 2013-12-03 LAB — POCT I-STAT, CHEM 8
Calcium, Ion: 1.25 mmol/L (ref 1.13–1.30)
Creatinine, Ser: 2.1 mg/dL — ABNORMAL HIGH (ref 0.50–1.10)
Hemoglobin: 10.2 g/dL — ABNORMAL LOW (ref 12.0–15.0)
Potassium: 4.2 mEq/L (ref 3.5–5.1)
Sodium: 145 mEq/L (ref 135–145)
TCO2: 22 mmol/L (ref 0–100)

## 2013-12-03 LAB — CBC WITH DIFFERENTIAL/PLATELET
Basophils Absolute: 0 10*3/uL (ref 0.0–0.1)
Eosinophils Absolute: 0.2 10*3/uL (ref 0.0–0.7)
Hemoglobin: 8.8 g/dL — ABNORMAL LOW (ref 12.0–15.0)
Lymphs Abs: 0.7 10*3/uL (ref 0.7–4.0)
MCV: 91.1 fL (ref 78.0–100.0)
Monocytes Absolute: 0.4 10*3/uL (ref 0.1–1.0)
Monocytes Relative: 2 % — ABNORMAL LOW (ref 3–12)
Neutro Abs: 13.5 10*3/uL — ABNORMAL HIGH (ref 1.7–7.7)
Neutrophils Relative %: 91 % — ABNORMAL HIGH (ref 43–77)
Platelets: 216 10*3/uL (ref 150–400)
RBC: 3.26 MIL/uL — ABNORMAL LOW (ref 3.87–5.11)
RDW: 18.3 % — ABNORMAL HIGH (ref 11.5–15.5)
WBC: 14.7 10*3/uL — ABNORMAL HIGH (ref 4.0–10.5)

## 2013-12-03 LAB — PRO B NATRIURETIC PEPTIDE: Pro B Natriuretic peptide (BNP): 1911 pg/mL — ABNORMAL HIGH (ref 0–125)

## 2013-12-03 MED ORDER — FERROUS SULFATE 325 (65 FE) MG PO TABS
325.0000 mg | ORAL_TABLET | Freq: Three times a day (TID) | ORAL | Status: DC
  Administered 2013-12-04 – 2013-12-11 (×21): 325 mg via ORAL
  Filled 2013-12-03 (×27): qty 1

## 2013-12-03 MED ORDER — PIPERACILLIN-TAZOBACTAM 3.375 G IVPB
3.3750 g | Freq: Three times a day (TID) | INTRAVENOUS | Status: DC
  Filled 2013-12-03: qty 50

## 2013-12-03 MED ORDER — ALBUTEROL SULFATE (5 MG/ML) 0.5% IN NEBU
2.5000 mg | INHALATION_SOLUTION | RESPIRATORY_TRACT | Status: DC
  Administered 2013-12-03: 2.5 mg via RESPIRATORY_TRACT
  Filled 2013-12-03: qty 0.5

## 2013-12-03 MED ORDER — ACETAMINOPHEN 325 MG PO TABS
650.0000 mg | ORAL_TABLET | ORAL | Status: DC | PRN
  Administered 2013-12-10 – 2013-12-16 (×2): 650 mg via ORAL
  Filled 2013-12-03 (×2): qty 2

## 2013-12-03 MED ORDER — NITROGLYCERIN 2 % TD OINT
1.0000 [in_us] | TOPICAL_OINTMENT | Freq: Once | TRANSDERMAL | Status: AC
  Administered 2013-12-03: 1 [in_us] via TOPICAL
  Filled 2013-12-03: qty 1

## 2013-12-03 MED ORDER — INSULIN GLARGINE 100 UNIT/ML ~~LOC~~ SOLN
15.0000 [IU] | Freq: Two times a day (BID) | SUBCUTANEOUS | Status: DC
  Administered 2013-12-03 – 2013-12-09 (×13): 15 [IU] via SUBCUTANEOUS
  Filled 2013-12-03 (×15): qty 0.15

## 2013-12-03 MED ORDER — ATORVASTATIN CALCIUM 20 MG PO TABS
20.0000 mg | ORAL_TABLET | Freq: Every day | ORAL | Status: DC
  Administered 2013-12-03 – 2013-12-15 (×13): 20 mg via ORAL
  Filled 2013-12-03 (×14): qty 1

## 2013-12-03 MED ORDER — HEPARIN SODIUM (PORCINE) 5000 UNIT/ML IJ SOLN
5000.0000 [IU] | Freq: Three times a day (TID) | INTRAMUSCULAR | Status: DC
  Administered 2013-12-03 – 2013-12-10 (×20): 5000 [IU] via SUBCUTANEOUS
  Filled 2013-12-03 (×23): qty 1

## 2013-12-03 MED ORDER — ALBUTEROL SULFATE (5 MG/ML) 0.5% IN NEBU: 2.5000 mg | INHALATION_SOLUTION | Freq: Four times a day (QID) | RESPIRATORY_TRACT | Status: DC | PRN

## 2013-12-03 MED ORDER — INSULIN ASPART 100 UNIT/ML ~~LOC~~ SOLN: 0.0000 [IU] | Freq: Three times a day (TID) | SUBCUTANEOUS | Status: DC

## 2013-12-03 MED ORDER — DOCUSATE SODIUM 100 MG PO CAPS
100.0000 mg | ORAL_CAPSULE | Freq: Two times a day (BID) | ORAL | Status: DC
  Administered 2013-12-03 – 2013-12-15 (×14): 100 mg via ORAL
  Filled 2013-12-03 (×26): qty 1

## 2013-12-03 MED ORDER — LEVOFLOXACIN IN D5W 750 MG/150ML IV SOLN
750.0000 mg | Freq: Once | INTRAVENOUS | Status: AC
  Administered 2013-12-03: 750 mg via INTRAVENOUS
  Filled 2013-12-03 (×2): qty 150

## 2013-12-03 MED ORDER — ALLOPURINOL 100 MG PO TABS
100.0000 mg | ORAL_TABLET | Freq: Every day | ORAL | Status: DC
  Administered 2013-12-04 – 2013-12-16 (×13): 100 mg via ORAL
  Filled 2013-12-03 (×14): qty 1

## 2013-12-03 MED ORDER — PANTOPRAZOLE SODIUM 40 MG PO TBEC
40.0000 mg | DELAYED_RELEASE_TABLET | Freq: Every day | ORAL | Status: DC
  Administered 2013-12-03 – 2013-12-16 (×13): 40 mg via ORAL
  Filled 2013-12-03 (×13): qty 1

## 2013-12-03 MED ORDER — SODIUM CHLORIDE 0.9 % IV SOLN
1750.0000 mg | INTRAVENOUS | Status: DC
  Administered 2013-12-04: 1750 mg via INTRAVENOUS
  Filled 2013-12-03 (×2): qty 1750

## 2013-12-03 MED ORDER — DIPHENHYDRAMINE HCL 25 MG PO CAPS: 25.0000 mg | ORAL_CAPSULE | Freq: Four times a day (QID) | ORAL | Status: DC | PRN

## 2013-12-03 MED ORDER — LEVOFLOXACIN IN D5W 750 MG/150ML IV SOLN
750.0000 mg | INTRAVENOUS | Status: DC
  Filled 2013-12-03: qty 150

## 2013-12-03 MED ORDER — AMLODIPINE BESYLATE 10 MG PO TABS
10.0000 mg | ORAL_TABLET | Freq: Every day | ORAL | Status: DC
  Administered 2013-12-04 – 2013-12-16 (×13): 10 mg via ORAL
  Filled 2013-12-03 (×14): qty 1

## 2013-12-03 MED ORDER — METOCLOPRAMIDE HCL 5 MG PO TABS
5.0000 mg | ORAL_TABLET | Freq: Three times a day (TID) | ORAL | Status: DC
  Administered 2013-12-03 – 2013-12-16 (×38): 5 mg via ORAL
  Filled 2013-12-03 (×41): qty 1

## 2013-12-03 MED ORDER — FUROSEMIDE 10 MG/ML IJ SOLN
80.0000 mg | Freq: Two times a day (BID) | INTRAMUSCULAR | Status: DC
  Administered 2013-12-03 – 2013-12-07 (×8): 80 mg via INTRAVENOUS
  Filled 2013-12-03 (×13): qty 8

## 2013-12-03 MED ORDER — POTASSIUM CHLORIDE CRYS ER 20 MEQ PO TBCR
20.0000 meq | EXTENDED_RELEASE_TABLET | Freq: Every day | ORAL | Status: DC
  Administered 2013-12-04 – 2013-12-10 (×7): 20 meq via ORAL
  Filled 2013-12-03 (×10): qty 1

## 2013-12-03 MED ORDER — POLYETHYLENE GLYCOL 3350 17 G PO PACK
17.0000 g | PACK | Freq: Two times a day (BID) | ORAL | Status: DC
  Administered 2013-12-06 – 2013-12-15 (×5): 17 g via ORAL
  Filled 2013-12-03 (×28): qty 1

## 2013-12-03 MED ORDER — ALBUTEROL SULFATE HFA 108 (90 BASE) MCG/ACT IN AERS: 2.0000 | INHALATION_SPRAY | Freq: Four times a day (QID) | RESPIRATORY_TRACT | Status: DC | PRN

## 2013-12-03 MED ORDER — VANCOMYCIN HCL 10 G IV SOLR
2500.0000 mg | Freq: Once | INTRAVENOUS | Status: AC
  Administered 2013-12-03: 2500 mg via INTRAVENOUS
  Filled 2013-12-03: qty 2500

## 2013-12-03 MED ORDER — SODIUM CHLORIDE 0.9 % IJ SOLN: 3.0000 mL | INTRAMUSCULAR | Status: DC | PRN

## 2013-12-03 MED ORDER — SODIUM CHLORIDE 0.9 % IV SOLN
INTRAVENOUS | Status: AC
  Administered 2013-12-03: 10 mL/h via INTRAVENOUS

## 2013-12-03 MED ORDER — HYDRALAZINE HCL 20 MG/ML IJ SOLN
10.0000 mg | Freq: Four times a day (QID) | INTRAMUSCULAR | Status: DC | PRN
  Administered 2013-12-10 – 2013-12-11 (×3): 10 mg via INTRAVENOUS
  Filled 2013-12-03 (×2): qty 1

## 2013-12-03 MED ORDER — SODIUM CHLORIDE 0.9 % IV SOLN: 250.0000 mL | INTRAVENOUS | Status: DC | PRN

## 2013-12-03 MED ORDER — OXYCODONE-ACETAMINOPHEN 5-325 MG PO TABS
2.0000 | ORAL_TABLET | Freq: Four times a day (QID) | ORAL | Status: DC | PRN
  Administered 2013-12-04: 2 via ORAL
  Administered 2013-12-06: 1 via ORAL
  Administered 2013-12-10 – 2013-12-13 (×3): 2 via ORAL
  Filled 2013-12-03 (×6): qty 2

## 2013-12-03 MED ORDER — HYDRALAZINE HCL 50 MG PO TABS
50.0000 mg | ORAL_TABLET | Freq: Three times a day (TID) | ORAL | Status: DC
  Administered 2013-12-03 – 2013-12-09 (×19): 50 mg via ORAL
  Filled 2013-12-03 (×23): qty 1

## 2013-12-03 MED ORDER — TRAZODONE 25 MG HALF TABLET
25.0000 mg | ORAL_TABLET | Freq: Every day | ORAL | Status: DC
  Administered 2013-12-03 – 2013-12-09 (×7): 25 mg via ORAL
  Filled 2013-12-03 (×8): qty 1

## 2013-12-03 MED ORDER — ISOSORBIDE DINITRATE 10 MG PO TABS
10.0000 mg | ORAL_TABLET | Freq: Two times a day (BID) | ORAL | Status: DC
  Administered 2013-12-04 – 2013-12-10 (×13): 10 mg via ORAL
  Filled 2013-12-03 (×20): qty 1

## 2013-12-03 MED ORDER — ONDANSETRON HCL 4 MG/2ML IJ SOLN
4.0000 mg | Freq: Four times a day (QID) | INTRAMUSCULAR | Status: DC | PRN
  Administered 2013-12-05 – 2013-12-14 (×2): 4 mg via INTRAVENOUS
  Filled 2013-12-03 (×2): qty 2

## 2013-12-03 MED ORDER — ALBUTEROL SULFATE (2.5 MG/3ML) 0.083% IN NEBU
2.5000 mg | INHALATION_SOLUTION | Freq: Three times a day (TID) | RESPIRATORY_TRACT | Status: DC
  Administered 2013-12-03 – 2013-12-06 (×9): 2.5 mg via RESPIRATORY_TRACT
  Filled 2013-12-03 (×3): qty 0.5
  Filled 2013-12-03: qty 3
  Filled 2013-12-03: qty 0.5
  Filled 2013-12-03: qty 3
  Filled 2013-12-03 (×3): qty 0.5
  Filled 2013-12-03 (×2): qty 3
  Filled 2013-12-03: qty 0.5
  Filled 2013-12-03: qty 3
  Filled 2013-12-03: qty 0.5

## 2013-12-03 MED ORDER — FUROSEMIDE 10 MG/ML IJ SOLN
40.0000 mg | Freq: Once | INTRAMUSCULAR | Status: AC
  Administered 2013-12-03: 40 mg via INTRAVENOUS
  Filled 2013-12-03: qty 4

## 2013-12-03 MED ORDER — ASPIRIN 325 MG PO TABS
325.0000 mg | ORAL_TABLET | Freq: Every day | ORAL | Status: DC
  Administered 2013-12-04 – 2013-12-16 (×13): 325 mg via ORAL
  Filled 2013-12-03 (×14): qty 1

## 2013-12-03 MED ORDER — SODIUM CHLORIDE 0.9 % IJ SOLN
3.0000 mL | Freq: Two times a day (BID) | INTRAMUSCULAR | Status: DC
  Administered 2013-12-05: 3 mL via INTRAVENOUS

## 2013-12-03 MED ORDER — POTASSIUM CHLORIDE 20 MEQ PO PACK
20.0000 meq | PACK | Freq: Every day | ORAL | Status: DC
  Filled 2013-12-03: qty 1

## 2013-12-03 MED ORDER — AMIODARONE HCL 200 MG PO TABS
200.0000 mg | ORAL_TABLET | Freq: Every day | ORAL | Status: DC
  Administered 2013-12-04 – 2013-12-09 (×6): 200 mg via ORAL
  Filled 2013-12-03 (×8): qty 1

## 2013-12-03 MED ORDER — METOPROLOL TARTRATE 12.5 MG HALF TABLET
12.5000 mg | ORAL_TABLET | Freq: Two times a day (BID) | ORAL | Status: DC
  Administered 2013-12-04 (×2): 12.5 mg via ORAL
  Filled 2013-12-03 (×6): qty 1

## 2013-12-03 NOTE — Progress Notes (Signed)
O2 to stay at 100% per MD

## 2013-12-03 NOTE — Progress Notes (Addendum)
RT decreased Fi02 to 80% during first round bipap check and found patient at 2nd round 23:45 Fi02 increased to 100% without documentation. RT decreased Fi02 back to 80% per sat of 100%. RT to obtain arterial blood gas.

## 2013-12-03 NOTE — Progress Notes (Signed)
Unit CM UR Completed by [facility] ED CM  W. Joni Colegrove RN  

## 2013-12-03 NOTE — Progress Notes (Signed)
ANTIBIOTIC CONSULT NOTE - INITIAL  Pharmacy Consult for Vancomycin Indication: rule out pneumonia  Allergies  Allergen Reactions  . Daptomycin Other (See Comments)    Elevated CPK  . Morphine And Related Nausea And Vomiting  . Cephalexin Rash    unknown  . Celecoxib     Unknown, can use furosemide without issue  . Codeine     unknown  . Fluoxetine Hcl     unknown  . Latex     REACTION: undefined  . Ofloxacin     unknown  . Penicillins     unknown  . Rofecoxib     unknown  . Sulfonamide Derivatives     unknown  . Tramadol Nausea And Vomiting    Patient Measurements: Weight: 289 lb 0.4 oz (131.1 kg) IBW: 52.4kg Adjusted Body Weight: 76 kg  Vital Signs: Temp: 97.1 F (36.2 C) (12/26 1033) Temp src: Rectal (12/26 1033) BP: 160/52 mmHg (12/26 1330) Pulse Rate: 63 (12/26 1330) Intake/Output from previous day:   Intake/Output from this shift:    Labs:  Recent Labs  12/03/13 1040 12/03/13 1052  WBC 14.7*  --   HGB 8.8* 10.2*  PLT 216  --   CREATININE  --  2.10*   The CrCl is unknown because both a height and weight (above a minimum accepted value) are required for this calculation. No results found for this basename: VANCOTROUGH, VANCOPEAK, VANCORANDOM, GENTTROUGH, GENTPEAK, GENTRANDOM, TOBRATROUGH, TOBRAPEAK, TOBRARND, AMIKACINPEAK, AMIKACINTROU, AMIKACIN,  in the last 72 hours   Microbiology: Recent Results (from the past 720 hour(s))  CULTURE, BLOOD (ROUTINE X 2)     Status: None   Collection Time    11/09/13  4:05 PM      Result Value Range Status   Specimen Description BLOOD LEFT ARM   Final   Special Requests BOTTLES DRAWN AEROBIC AND ANAEROBIC   Final   Culture  Setup Time     Final   Value: 11/09/2013 20:31     Performed at [facility]   Culture     Final   Value: NO GROWTH 5 DAYS     Performed at [facility]   Report Status 11/15/2013 FINAL   Final  CULTURE, BLOOD (ROUTINE X 2)     Status: None   Collection Time     11/09/13  4:10 PM      Result Value Range Status   Specimen Description BLOOD LEFT ANTECUBITAL   Final   Special Requests BOTTLES DRAWN AEROBIC AND ANAEROBIC   Final   Culture  Setup Time     Final   Value: 11/09/2013 20:30     Performed at [facility]   Culture     Final   Value: STAPHYLOCOCCUS SPECIES (COAGULASE NEGATIVE)     Note: THE SIGNIFICANCE OF ISOLATING THIS ORGANISM FROM A SINGLE SET OF BLOOD CULTURES WHEN MULTIPLE SETS ARE DRAWN IS UNCERTAIN. PLEASE NOTIFY THE MICROBIOLOGY DEPARTMENT WITHIN ONE WEEK IF SPECIATION AND SENSITIVITIES ARE REQUIRED.     Note: Gram Stain Report Called to,Read Back By and Verified With: ALLIE S @ 1508 11/10/13 BY KRAWS     Performed at [facility]   Report Status 11/11/2013 FINAL   Final  URINE CULTURE     Status: None   Collection Time    11/09/13  7:42 PM      Result Value Range Status   Specimen Description URINE, CLEAN CATCH   Final   Special Requests NONE  Final   Culture  Setup Time     Final   Value: 11/10/2013 02:46     Performed at [facility] Count     Final   Value: >=100,000 COLONIES/ML     Performed at [facility]   Culture     Final   Value: ESCHERICHIA COLI     Performed at [facility]   Report Status 11/12/2013 FINAL   Final   Organism ID, Bacteria ESCHERICHIA COLI   Final  MRSA PCR SCREENING     Status: None   Collection Time    11/17/13  4:14 PM      Result Value Range Status   MRSA by PCR NEGATIVE  NEGATIVE Final   Comment:            The GeneXpert MRSA Assay (FDA     approved for NASAL specimens     only), is one component of a     comprehensive MRSA colonization     surveillance program. It is not     intended to diagnose MRSA     infection nor to guide or     monitor treatment for     MRSA infections.    Medical History: Past Medical History  Diagnosis Date  . Asthma   . Bronchitis   . Allergic rhinitis   . HTN (hypertension)   . Hyperlipidemia    . Obesity   . OSA (obstructive sleep apnea)     poor compliance with cpap  . CHF (congestive heart failure)   . Biventricular failure     compensated  . Psoriasis   . Stasis edema     of lower extremities  . Depression   . Situational stress   . Anemia   . Gout   . Chronic anticoagulation   . Yeast infection involving the vagina and surrounding area   . Atrial fibrillation with RVR   . GERD (gastroesophageal reflux disease)   . Chronic bronchitis     "get it q yr" (08/10/2013)  . Pneumonia     "said I did on 08/03/2013 then they ruled it out" (08/10/2013)  . Shortness of breath     "all the time" (08/10/2013)  . On home oxygen therapy     "2L during the night and prn during the day" (08/10/2013)  . Type II diabetes mellitus   . History of blood transfusion     "few during my lifetime; last time was 4 days ago when I got 2 units" (08/10/2013)  . Stroke ?2007    denies residuals on 08/10/2013  . DJD (degenerative joint disease)   . Bleeding     "from my skin folds; just won't stop" (08/10/2013)  . Complication of anesthesia     "they gave me too much; I stayed out of it for awhile" (08/10/2013)  . Anxiety   . Kidney stones   . Kidney disease, chronic, stage III (GFR 30-59 ml/min)   . Chronic kidney disease, stage IV (severe)   . Morbid obesity     Medications:  Prescriptions prior to admission  Medication Sig Dispense Refill  . albuterol (PROVENTIL HFA;VENTOLIN HFA) 108 (90 BASE) MCG/ACT inhaler Inhale 2 puffs into the lungs every 6 (six) hours as needed for wheezing or shortness of breath.       [name] albuterol (PROVENTIL) (2.5 MG/3ML) 0.083% nebulizer solution Take 2.5 mg by nebulization every 6 (six) hours as needed for wheezing or shortness  of breath.      . allopurinol (ZYLOPRIM) 100 MG tablet Take 100 mg by mouth daily. Patient takes at 0800am daily      . amiodarone (PACERONE) 200 MG tablet Take 200 mg by mouth daily. Patient takes at 0800am daily      . amLODipine (NORVASC) 10 MG  tablet Take 10 mg by mouth daily. Patient takes at 0800am daily      . aspirin 325 MG tablet Take 325 mg by mouth daily.      [name] atorvastatin (LIPITOR) 20 MG tablet Take 20 mg by mouth at bedtime. Patient takes at 2100      . diphenhydrAMINE (BENADRYL) 25 mg capsule Take 25 mg by mouth every 4 (four) hours as needed for itching.      . diphenhydrAMINE-zinc acetate (BENADRYL) cream Apply 1 application topically 3 (three) times daily as needed for itching.      . docusate sodium (COLACE) 100 MG capsule Take 100 mg by mouth 2 (two) times daily.      . ferrous sulfate 325 (65 FE) MG tablet Take 325 mg by mouth 3 (three) times daily with meals. Patient takes at 0800, 1200, and 1700      . furosemide (LASIX) 40 MG tablet Take 40 mg by mouth 2 (two) times daily. Patient takes at 0800am daily      . hydrALAZINE (APRESOLINE) 50 MG tablet Take 50 mg by mouth 3 (three) times daily.      . insulin glargine (LANTUS) 100 UNIT/ML injection Inject 15 Units into the skin 2 (two) times daily.      . isosorbide dinitrate (ISORDIL) 10 MG tablet Take 1 tablet (10 mg total) by mouth 2 (two) times daily.      . metoCLOPramide (REGLAN) 5 MG tablet Take 5 mg by mouth 3 (three) times daily.      . metoprolol tartrate (LOPRESSOR) 25 MG tablet Take 12.5 mg by mouth 2 (two) times daily.      [name] omeprazole (PRILOSEC) 20 MG capsule Take 20 mg by mouth daily.      . ondansetron (ZOFRAN) 4 MG tablet Take 4 mg by mouth every 6 (six) hours as needed for nausea or vomiting.      [name] oxyCODONE-acetaminophen (PERCOCET/ROXICET) 5-325 MG per tablet Take 2 tablets by mouth every 6 (six) hours as needed for severe pain.      . pantoprazole (PROTONIX) 40 MG tablet Take 40 mg by mouth daily.      . polyethylene glycol (MIRALAX / GLYCOLAX) packet Take 17 g by mouth 2 (two) times daily.      . potassium chloride (KLOR-CON) 20 MEQ packet Take 20 mEq by mouth daily.      . traZODone (DESYREL) 50 MG tablet Take 25 mg by mouth at bedtime.        Assessment: SOB 62 y/o morbidly obese female from SNF with complex PMH presents with SOB. Sats in the 80s and placed on CPAP by EMS. Patient has h/o asthma, OSA, CHF, chronic SOB on home O2, and recent PNA 9/14. Patient is in acute respiratory distress and requires abx for PNA. CRI and elevated proBNP noted.  Vitals/Labs: proBNP 1911. pH 7.2. Scr 2.1, WBC 14.7. Estimated CrCl 33 using adjusted BW.  Goal of Therapy:  Vancomycin trough level 15-20 mcg/ml  Plan:  Vanco 2500mg  IV load x 1 then 1750mg  IV q24h. Trough after 3-5 doses at steady state. Levaquin 750mg  IV q48h dose ok.  Duncan Alejandro S. Merilynn Finland, PharmD,  BCPS Clinical Staff Pharmacist Pager [phone]  Misty Stanley Stillinger 12/03/2013,2:39 PM

## 2013-12-03 NOTE — Progress Notes (Signed)
Per MD order a foley cath was placed at 1815 for diuretics and per pt request due to severe SOB with excertion while on the Bipap machine. Upon insertion it was noted patient had some white tinged discharge from vagina. Urine returned showed cloudy urine with sediment. Call and spoke with MD, a urine culture and UA was ordered and sent down. Will continue to monitor.

## 2013-12-03 NOTE — ED Provider Notes (Signed)
CSN: [id number]     Arrival date & time 12/03/13  1018 History   First MD Initiated Contact with Patient 12/03/13 1021     No chief complaint on file.  complaint short breath (Consider location/radiation/quality/duration/timing/severity/associated sxs/prior Treatment) HPI Level V caveat patient extremely dyspneic. History is obtained from paramedics and from [facility] by phone at skilled nursing facility and from old records. Patient has become progressively more dyspneic since last night. Pulse oximetry noted to be in the 80s at 9 AM today on 10 L of oxygen. She was treated by EMS with CPAP and with supplemental oxygen and received a nebulizer treatment while en route. Patient states "I cannot breathe"she's unable to speak otherwise. Past Medical History  Diagnosis Date  . Asthma   . Bronchitis   . Allergic rhinitis   . HTN (hypertension)   . Hyperlipidemia   . Obesity   . OSA (obstructive sleep apnea)     poor compliance with cpap  . CHF (congestive heart failure)   . Biventricular failure     compensated  . Psoriasis   . Stasis edema     of lower extremities  . Depression   . Situational stress   . Anemia   . Gout   . Chronic anticoagulation   . Yeast infection involving the vagina and surrounding area   . Atrial fibrillation with RVR   . GERD (gastroesophageal reflux disease)   . Chronic bronchitis     "get it q yr" (08/10/2013)  . Pneumonia     "said I did on 08/03/2013 then they ruled it out" (08/10/2013)  . Shortness of breath     "all the time" (08/10/2013)  . On home oxygen therapy     "2L during the night and prn during the day" (08/10/2013)  . Type II diabetes mellitus   . History of blood transfusion     "few during my lifetime; last time was 4 days ago when I got 2 units" (08/10/2013)  . Stroke ?2007    denies residuals on 08/10/2013  . DJD (degenerative joint disease)   . Bleeding     "from my skin folds; just won't stop" (08/10/2013)  . Complication of  anesthesia     "they gave me too much; I stayed out of it for awhile" (08/10/2013)  . Anxiety   . Kidney stones   . Kidney disease, chronic, stage III (GFR 30-59 ml/min)   . Chronic kidney disease, stage IV (severe)   . Morbid obesity    Past Surgical History  Procedure Laterality Date  . Appendectomy    . Cholecystectomy    . Total knee arthroplasty Left ~ 2006  . Total abdominal hysterectomy    . Back surgery    . Cystectomy Left     hand  . Tonsillectomy    . Joint replacement    . Lumbar disc surgery    . Colonoscopy N/A 08/16/2013    Procedure: COLONOSCOPY;  Surgeon: Theda Belfast, MD;  Location: [facility] ENDOSCOPY;  Service: Endoscopy;  Laterality: N/A;  . Hematoma evacuation Right 09/10/2013    Procedure: EVACUATION OF RIGHT LEG HEMATOMA AND EXCISION DEBRIDIMENT RIGHT LEG ;  Surgeon: Robyne Askew, MD;  Location: [facility];  Service: General;  Laterality: Right;  . Debridement and closure wound Right 09/22/2013    Procedure: IRRIGATION AND DEBRIDEMENT SURGICAL PREP PLACEMENT OF ACELL AND VAC  ;  Surgeon: Glenna Fellows, MD;  Location: [facility];  Service: Plastics;  Laterality: Right;  . Skin split graft Right 10/07/2013    Procedure: SPLIT THICKNESS SKIN GRAFT RIGHT THIGH TO RIGHT LEG, PLACEMENT OF A CEL AND WOUND VAC TO RIGHT THIGH  ;  Surgeon: Glenna Fellows, MD;  Location: [facility];  Service: Plastics;  Laterality: Right;   Family History  Problem Relation Age of Onset  . Heart attack Father   . Asthma Father   . Heart disease Paternal Uncle   . Rectal cancer Paternal Aunt   . Other Mother     mva   History  Substance Use Topics  . Smoking status: Former Smoker -- 0.12 packs/day for 12 years    Types: Cigarettes    Quit date: 07/05/1982  . Smokeless tobacco: Never Used     Comment: smoked for 1.5 yrs about 2 cigs a day ,only if stressed  . Alcohol Use: No   OB History   Grav Para Term Preterm Abortions TAB SAB Ect Mult Living                 Review of Systems   Unable to perform ROS: Acuity of condition  Respiratory: Positive for shortness of breath.     Allergies  Daptomycin; Morphine and related; Cephalexin; Celecoxib; Codeine; Fluoxetine hcl; Latex; Ofloxacin; Penicillins; Rofecoxib; Sulfonamide derivatives; and Tramadol  Home Medications   Current Outpatient Rx  Name  Route  Sig  Dispense  Refill  . albuterol (PROVENTIL HFA;VENTOLIN HFA) 108 (90 BASE) MCG/ACT inhaler   Inhalation   Inhale 2 puffs into the lungs every 6 (six) hours as needed for wheezing or shortness of breath.          [name] albuterol (PROVENTIL) (2.5 MG/3ML) 0.083% nebulizer solution   Nebulization   Take 2.5 mg by nebulization every 6 (six) hours as needed for wheezing or shortness of breath.         . allopurinol (ZYLOPRIM) 100 MG tablet   Oral   Take 100 mg by mouth daily. Patient takes at 0800am daily         . amiodarone (PACERONE) 200 MG tablet   Oral   Take 200 mg by mouth daily. Patient takes at 0800am daily         . amLODipine (NORVASC) 10 MG tablet   Oral   Take 10 mg by mouth daily. Patient takes at 0800am daily         . aspirin 325 MG tablet   Oral   Take 325 mg by mouth daily.         [name] atorvastatin (LIPITOR) 20 MG tablet   Oral   Take 20 mg by mouth at bedtime. Patient takes at 2100         . diphenhydrAMINE (BENADRYL) 25 mg capsule   Oral   Take 25 mg by mouth every 4 (four) hours as needed for itching.         . diphenhydrAMINE-zinc acetate (BENADRYL) cream   Topical   Apply 1 application topically 3 (three) times daily as needed for itching.         . docusate sodium (COLACE) 100 MG capsule   Oral   Take 100 mg by mouth 2 (two) times daily.         . ferrous sulfate 325 (65 FE) MG tablet   Oral   Take 325 mg by mouth 3 (three) times daily with meals. Patient takes at 0800, 1200, and 1700         . furosemide (  LASIX) 40 MG tablet   Oral   Take 40 mg by mouth 2 (two) times daily. Patient takes at 0800am  daily         . hydrALAZINE (APRESOLINE) 50 MG tablet   Oral   Take 50 mg by mouth 3 (three) times daily.         . insulin glargine (LANTUS) 100 UNIT/ML injection   Subcutaneous   Inject 15 Units into the skin 2 (two) times daily.         . isosorbide dinitrate (ISORDIL) 10 MG tablet   Oral   Take 1 tablet (10 mg total) by mouth 2 (two) times daily.         . metoCLOPramide (REGLAN) 5 MG tablet   Oral   Take 5 mg by mouth 3 (three) times daily.         . metoprolol tartrate (LOPRESSOR) 25 MG tablet   Oral   Take 12.5 mg by mouth 2 (two) times daily.         [name] omeprazole (PRILOSEC) 20 MG capsule   Oral   Take 20 mg by mouth daily.         . ondansetron (ZOFRAN) 4 MG tablet   Oral   Take 4 mg by mouth every 6 (six) hours as needed for nausea or vomiting.         [name] oxyCODONE-acetaminophen (PERCOCET/ROXICET) 5-325 MG per tablet   Oral   Take 2 tablets by mouth every 6 (six) hours as needed for severe pain.         . pantoprazole (PROTONIX) 40 MG tablet   Oral   Take 40 mg by mouth daily.         . polyethylene glycol (MIRALAX / GLYCOLAX) packet   Oral   Take 17 g by mouth 2 (two) times daily.         . potassium chloride (KLOR-CON) 20 MEQ packet   Oral   Take 20 mEq by mouth daily.         . traZODone (DESYREL) 50 MG tablet   Oral   Take 25 mg by mouth at bedtime.          There were no vitals taken for this visit. Physical Exam  Nursing note and vitals reviewed. Constitutional:  Chronically and acutely ill-appearing. Respiratory distress.  HENT:  Head: Normocephalic and atraumatic.  Eyes: Conjunctivae are normal. Pupils are equal, round, and reactive to light.  Neck: Neck supple. No tracheal deviation present. No thyromegaly present.  Cardiovascular: Normal rate and regular rhythm.   No murmur heard. Pulmonary/Chest: She is in respiratory distress.  Tachypnea, diffuse Rales.  Abdominal: Soft. Bowel sounds are normal. She exhibits  no distension. There is no tenderness.  Morbidly obese  Musculoskeletal: Normal range of motion. She exhibits no edema and no tenderness.  Neurological: She is alert. Coordination normal.  Skin: Skin is warm and dry. No rash noted.  Brawny 4+ pretibial edema bilaterally  Psychiatric: She has a normal mood and affect.    ED Course  Procedures (including critical care time) Labs Review Labs Reviewed  CBC WITH DIFFERENTIAL  URINALYSIS, ROUTINE W REFLEX MICROSCOPIC   Imaging Review No results found.  EKG Interpretation   None      BiPAP started at 7/14 FiO2 100% 11:50 AM he feels much improved after treatment with BiPAP. Chest x-ray viewed by me. Intravenous Lasix, nitroglycerin ointment ordered MDM  No diagnosis found. Case discussed with Dr.Dhungel Plan admit  step down unit Diagnosis #1 acute respiratory failure  #2 congestive heart failure #3 chronic renal insufficiency #4 anemia. CRITICAL CARE Performed by: Doug Sou Total critical care time: 40 minute Critical care time was exclusive of separately billable procedures and treating other patients. Critical care was necessary to treat or prevent imminent or life-threatening deterioration. Critical care was time spent personally by me on the following activities: development of treatment plan with patient and/or surrogate as well as nursing, discussions with consultants, evaluation of patient's response to treatment, examination of patient, obtaining history from patient or surrogate, ordering and performing treatments and interventions, ordering and review of laboratory studies, ordering and review of radiographic studies, pulse oximetry and re-evaluation of patient's condition.     Doug Sou, MD 12/03/13 [phone]

## 2013-12-03 NOTE — H&P (Addendum)
[facility] History and Physical  [name] WUJ:[id number] DOB: 09-01-1951 DOA: 12/03/2013  Referring physician: Dr [name] PCP: [name] [name]   Chief Complaint:  Shortness of breath x 1 day  HPI:  62 year old obese female with history of acute mild systolic and diastolic failure, type 2 diabetes mellitus, CKD stage IV, paroxysmal A. fib (not on anticoagulation due to GI bleed and leg hematoma), chronic anemia, obstructive sleep apnea/OHS noncompliant with CPAP, hypertension, recent hospitalization for respiratory failure associated with CHF who was admitted 2 weeks back for acute respiratory failure in the setting of CHF exacerbation and discharged to nursing home was brought to the ED with worsening shortness of breath since yesterday. As per husband at bedside patient started having progressive shortness of breath since yesterday afternoon along with cough with whitish phlegm. She reported of having some headache but denies any blurred vision, dizziness, reports subjective fever and chills, orthopnea and PND. She reports nausea but no vomiting. She denies any chest pain or palpitations, abdominal pain, bowel or urinary symptoms. This time as in the previous admission she had similar presentation of being in the acute hypoxic respiratory failure. EMS was called and as she was found to be tachypneic and tachycardic she was placed on BiPAP and brought to the ED. An ABG done in the ED showed a pH of 7.2, PCO2 of 54 and PO2 of 167. Patient O2 sats were stable on BiPAP. Blood work showed leukocytosis with WBC of 14.7, hemoglobin of 10.2, platelets of 216. Sodium of 145, potassium 4.2, chloride 112, BUN of 46, creatinine of 2.1 and glucose of 211. BNP was 1900. Chest x-ray done showed bilateral pulmonary opacity suggestive likely of pulmonary edema with possible bilateral pneumonia. Patient given nitro paste and 40 mg IV Lasix and [facility]  [facility].     Review of Systems:  Constitutional:fever, chills, fatigue, diaphoresis, appetite change  HEENT: Denies photophobia, , hearing loss, ear pain, congestion, sore throat, rhinorrhea, sneezing, mouth sores, trouble swallowing, neck pain, neck stiffness and tinnitus.   Respiratory: SOB, DOE, cough, denies chest tightness,  and wheezing.   Cardiovascular: Denies chest pain, palpitations and leg swelling.  Gastrointestinal:  nausea, denies vomiting, abdominal pain, diarrhea, constipation, blood in stool and abdominal distention.  Genitourinary: Denies dysuria, urgency, frequency, hematuria, flank pain and difficulty urinating.  Endocrine: Denies  polyuria, polydipsia. Musculoskeletal: Denies myalgias, back pain, joint swelling, arthralgias and gait problem.  Skin: Denies pallor, rash and wound.  Neurological: Denies dizziness, seizures, syncope, weakness, light-headedness, numbness and headaches.  Psychiatric/Behavioral: Denies  mood changes, confusion, nervousness, sleep disturbance and agitation   Past Medical History  Diagnosis Date  . Asthma   . Bronchitis   . Allergic rhinitis   . HTN (hypertension)   . Hyperlipidemia   . Obesity   . OSA (obstructive sleep apnea)     poor compliance with cpap  . CHF (congestive heart failure)   . Biventricular failure     compensated  . Psoriasis   . Stasis edema     of lower extremities  . Depression   . Situational stress   . Anemia   . Gout   . Chronic anticoagulation   . Yeast infection involving the vagina and surrounding area   . Atrial fibrillation with RVR   . GERD (gastroesophageal reflux disease)   . Chronic bronchitis     "get it q yr" (08/10/2013)  . Pneumonia     "said I did on 08/03/2013 then  they ruled it out" (08/10/2013)  . Shortness of breath     "all the time" (08/10/2013)  . On home oxygen therapy     "2L during the night and prn during the day" (08/10/2013)  . Type II diabetes mellitus   . History of blood  transfusion     "few during my lifetime; last time was 4 days ago when I got 2 units" (08/10/2013)  . Stroke ?2007    denies residuals on 08/10/2013  . DJD (degenerative joint disease)   . Bleeding     "from my skin folds; just won't stop" (08/10/2013)  . Complication of anesthesia     "they gave me too much; I stayed out of it for awhile" (08/10/2013)  . Anxiety   . Kidney stones   . Kidney disease, chronic, stage III (GFR 30-59 ml/min)   . Chronic kidney disease, stage IV (severe)   . Morbid obesity    Past Surgical History  Procedure Laterality Date  . Appendectomy    . Cholecystectomy    . Total knee arthroplasty Left ~ 2006  . Total abdominal hysterectomy    . Back surgery    . Cystectomy Left     hand  . Tonsillectomy    . Joint replacement    . Lumbar disc surgery    . Colonoscopy N/A 08/16/2013    Procedure: COLONOSCOPY;  Surgeon: Nancy Belfast, [name];  Location: [facility] ENDOSCOPY;  Service: Endoscopy;  Laterality: N/A;  . Hematoma evacuation Right 09/10/2013    Procedure: EVACUATION OF RIGHT LEG HEMATOMA AND EXCISION DEBRIDIMENT RIGHT LEG ;  Surgeon: Nancy Askew, [name];  Location: [facility];  Service: General;  Laterality: Right;  . Debridement and closure wound Right 09/22/2013    Procedure: IRRIGATION AND DEBRIDEMENT SURGICAL PREP PLACEMENT OF ACELL AND VAC  ;  Surgeon: Nancy Fellows, [name];  Location: [facility];  Service: Plastics;  Laterality: Right;  . Skin split graft Right 10/07/2013    Procedure: SPLIT THICKNESS SKIN GRAFT RIGHT THIGH TO RIGHT LEG, PLACEMENT OF A CEL AND WOUND VAC TO RIGHT THIGH  ;  Surgeon: Nancy Fellows, [name];  Location: [facility];  Service: Plastics;  Laterality: Right;   Social History:  reports that she quit smoking about 31 years ago. Her smoking use included Cigarettes. She has a 1.44 pack-year smoking history. She has never used smokeless tobacco. She reports that she does not drink alcohol or use illicit drugs.  Allergies  Allergen Reactions  . Daptomycin  Other (See Comments)    Elevated CPK  . Morphine And Related Nausea And Vomiting  . Cephalexin Rash    unknown  . Celecoxib     Unknown, can use furosemide without issue  . Codeine     unknown  . Fluoxetine Hcl     unknown  . Latex     REACTION: undefined  . Ofloxacin     unknown  . Penicillins     unknown  . Rofecoxib     unknown  . Sulfonamide Derivatives     unknown  . Tramadol Nausea And Vomiting    Family History  Problem Relation Age of Onset  . Heart attack Father   . Asthma Father   . Heart disease Paternal Uncle   . Rectal cancer Paternal Aunt   . Other Mother     mva    Prior to Admission medications   Medication Sig Start Date End Date Taking? Authorizing Provider  albuterol (PROVENTIL HFA;VENTOLIN  HFA) 108 (90 BASE) MCG/ACT inhaler Inhale 2 puffs into the lungs every 6 (six) hours as needed for wheezing or shortness of breath.     Historical Provider, [name]  albuterol (PROVENTIL) (2.5 MG/3ML) 0.083% nebulizer solution Take 2.5 mg by nebulization every 6 (six) hours as needed for wheezing or shortness of breath.    Historical Provider, [name]  allopurinol (ZYLOPRIM) 100 MG tablet Take 100 mg by mouth daily. Patient takes at 0800am daily 09/16/11   Historical Provider, [name]  amiodarone (PACERONE) 200 MG tablet Take 200 mg by mouth daily. Patient takes at 0800am daily    Historical Provider, [name]  amLODipine (NORVASC) 10 MG tablet Take 10 mg by mouth daily. Patient takes at 0800am daily 08/19/13   Leroy Sea, [name]  aspirin 325 MG tablet Take 325 mg by mouth daily.    Historical Provider, [name]  atorvastatin (LIPITOR) 20 MG tablet Take 20 mg by mouth at bedtime. Patient takes at 2100    Historical Provider, [name]  diphenhydrAMINE (BENADRYL) 25 mg capsule Take 25 mg by mouth every 4 (four) hours as needed for itching.    Historical Provider, [name]  diphenhydrAMINE-zinc acetate (BENADRYL) cream Apply 1 application topically 3 (three) times daily as needed for itching.    Historical  Provider, [name]  docusate sodium (COLACE) 100 MG capsule Take 100 mg by mouth 2 (two) times daily.    Historical Provider, [name]  ferrous sulfate 325 (65 FE) MG tablet Take 325 mg by mouth 3 (three) times daily with meals. Patient takes at 0800, 1200, and 1700 08/29/12   Gwenyth Bender, [name]  furosemide (LASIX) 40 MG tablet Take 40 mg by mouth 2 (two) times daily. Patient takes at 0800am daily 08/19/13   Leroy Sea, [name]  hydrALAZINE (APRESOLINE) 50 MG tablet Take 50 mg by mouth 3 (three) times daily.    Historical Provider, [name]  insulin glargine (LANTUS) 100 UNIT/ML injection Inject 15 Units into the skin 2 (two) times daily.    Historical Provider, [name]  isosorbide dinitrate (ISORDIL) 10 MG tablet Take 1 tablet (10 mg total) by mouth 2 (two) times daily. 11/22/13   Marinda Elk, [name]  metoCLOPramide (REGLAN) 5 MG tablet Take 5 mg by mouth 3 (three) times daily.    Historical Provider, [name]  metoprolol tartrate (LOPRESSOR) 25 MG tablet Take 12.5 mg by mouth 2 (two) times daily.    Historical Provider, [name]  omeprazole (PRILOSEC) 20 MG capsule Take 20 mg by mouth daily.    Historical Provider, [name]  ondansetron (ZOFRAN) 4 MG tablet Take 4 mg by mouth every 6 (six) hours as needed for nausea or vomiting.    Historical Provider, [name]  oxyCODONE-acetaminophen (PERCOCET/ROXICET) 5-325 MG per tablet Take 2 tablets by mouth every 6 (six) hours as needed for severe pain.    Historical Provider, [name]  pantoprazole (PROTONIX) 40 MG tablet Take 40 mg by mouth daily.    Historical Provider, [name]  polyethylene glycol (MIRALAX / GLYCOLAX) packet Take 17 g by mouth 2 (two) times daily.    Historical Provider, [name]  potassium chloride (KLOR-CON) 20 MEQ packet Take 20 mEq by mouth daily.    Historical Provider, [name]  traZODone (DESYREL) 50 MG tablet Take 25 mg by mouth at bedtime.    Historical Provider, [name]    Physical Exam:  Filed Vitals:   12/03/13 1100 12/03/13 1130 12/03/13 1200 12/03/13 1215  BP: 118/38 123/68 128/80 137/87   Pulse: 77 71 66 66  Temp:  TempSrc:      Resp: 31 25 22 26   SpO2: 100% 100% 100% 100%    Constitutional: Vital signs reviewed.  Morbidly obese female lying in bed BiPAP HEENT: No pallor, on BiPAP, no JVD appreciated Chest: Poor respiratory sounds due to body habitus, no crackles rhonchi or wheeze heard Cardiovascular: RRR, S1 normal, S2 normal, no MRG, Abdominal: Soft. Non-tender, non-distended, bowel sounds are normal, no masses, organomegaly, or guarding present.  Extremities: 1+ pitting edema bilaterally, chronic skin changes over the left lower extremities CNS: AAO x3   Labs on Admission:  Basic Metabolic Panel:  Recent Labs Lab 12/03/13 1052  NA 145  K 4.2  CL 112  GLUCOSE 211*  BUN 46*  CREATININE 2.10*   Liver Function Tests: No results found for this basename: AST, ALT, ALKPHOS, BILITOT, PROT, ALBUMIN,  in the last 168 hours No results found for this basename: LIPASE, AMYLASE,  in the last 168 hours No results found for this basename: AMMONIA,  in the last 168 hours CBC:  Recent Labs Lab 12/03/13 1040 12/03/13 1052  WBC 14.7*  --   NEUTROABS 13.5*  --   HGB 8.8* 10.2*  HCT 29.7* 30.0*  MCV 91.1  --   PLT 216  --    Cardiac Enzymes: No results found for this basename: CKTOTAL, CKMB, CKMBINDEX, TROPONINI,  in the last 168 hours BNP: No components found with this basename: POCBNP,  CBG: No results found for this basename: GLUCAP,  in the last 168 hours  Radiological Exams on Admission: Dg Chest Portable 1 View  12/03/2013   CLINICAL DATA:  62 year old female with respiratory distress. Shortness of breath. Initial encounter.  EXAM: PORTABLE CHEST - 1 VIEW  COMPARISON:  11/17/2013 and earlier.  FINDINGS: Seated AP portable view at 1033 hrs. Mixed interstitial and airspace opacity in both lungs, increased in confluence since earlier this month. Chronic cardiomegaly. Other mediastinal contours are within normal limits. No pneumothorax. No large pleural  effusion.  IMPRESSION: More confluent bilateral pulmonary opacity, mixed interstitial and airspace appearance. Favor acute/worsening pulmonary edema over bilateral pneumonia.   Electronically Signed   By: Augusto Gamble M.D.   On: 12/03/2013 10:43    EKG: Sinus rhythm, no ST-T changes  Assessment/Plan Principal Problem:   Acute respiratory failure Likely in the setting of CHF exacerbation, OHS and possible underlying pneumonia. Admit to step down. Continue BiPAP for now and titrate as tolerated. Patient's body weight on discharge recently was 119 kg  which is assumed  to be her dry weight and is now 131kg. -will diurese with IV Lasix 80 mg twice a day next item monitor strict I/O. and daily weights   Active Problems: Pneumonia As seen on chest x-ray Given shortness of breath with subjective fever and chills I will cover her empirically with IV Levaquin and vancomycin for HCAP. Follow blood cultures. Check influenza PCR     HYPERTENSION Resume home blood pressure medications once tolerating by mouth Pedialyte whennecessaryIVhydralazine    Acute combined systolic and diastolic heart failure Recent echo with EF of 50-55% with grade 1 diastolic dysfunction. She follows with Dr. 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 Resume home dose insulin and sliding scale insulin   CAD Continue aspirin and Lipitor.    CKD (chronic kidney disease), stage IV Creatinine appears to be better than ideation discharge. Monitor while on IV Lasix    A-fib Continue beta blocker.  Continue amiodarone. Not an anticoagulation due to GI bleed and leg hematoma Monitor on telemetry  Diet: Diabetic DVT prophylaxis: Subcutaneous heparin  Code Status: Full code. Husband wants to discuss with the patient and her sons regarding goals of care Family Communication: Husband at bedside Disposition Plan: Likely discharge to skilled nursing  facility upon discharge  Eddie North [facility] Pager [phone]  If 7PM-7AM, please contact night-coverage [email or web address] Password [facility] 12/03/2013, 12:40 PM  Total time spent on admission: 70 minutes

## 2013-12-03 NOTE — ED Notes (Signed)
Patient began having sob per facility at approx 0930, patient placed on cpap per ems, patient placed on bi-pap on arrival

## 2013-12-03 NOTE — ED Provider Notes (Signed)
CSN: [id number]     Arrival date & time 12/03/13  1018 History   First MD Initiated Contact with Patient 12/03/13 1021     Chief Complaint  Patient presents with  . Respiratory Distress   (Consider location/radiation/quality/duration/timing/severity/associated sxs/prior Treatment) HPI  Past Medical History  Diagnosis Date  . Asthma   . Bronchitis   . Allergic rhinitis   . HTN (hypertension)   . Hyperlipidemia   . Obesity   . OSA (obstructive sleep apnea)     poor compliance with cpap  . CHF (congestive heart failure)   . Biventricular failure     compensated  . Psoriasis   . Stasis edema     of lower extremities  . Depression   . Situational stress   . Anemia   . Gout   . Chronic anticoagulation   . Yeast infection involving the vagina and surrounding area   . Atrial fibrillation with RVR   . GERD (gastroesophageal reflux disease)   . Chronic bronchitis     "get it q yr" (08/10/2013)  . Pneumonia     "said I did on 08/03/2013 then they ruled it out" (08/10/2013)  . Shortness of breath     "all the time" (08/10/2013)  . On home oxygen therapy     "2L during the night and prn during the day" (08/10/2013)  . Type II diabetes mellitus   . History of blood transfusion     "few during my lifetime; last time was 4 days ago when I got 2 units" (08/10/2013)  . Stroke ?2007    denies residuals on 08/10/2013  . DJD (degenerative joint disease)   . Bleeding     "from my skin folds; just won't stop" (08/10/2013)  . Complication of anesthesia     "they gave me too much; I stayed out of it for awhile" (08/10/2013)  . Anxiety   . Kidney stones   . Kidney disease, chronic, stage III (GFR 30-59 ml/min)   . Chronic kidney disease, stage IV (severe)   . Morbid obesity    Past Surgical History  Procedure Laterality Date  . Appendectomy    . Cholecystectomy    . Total knee arthroplasty Left ~ 2006  . Total abdominal hysterectomy    . Back surgery    . Cystectomy Left     hand  .  Tonsillectomy    . Joint replacement    . Lumbar disc surgery    . Colonoscopy N/A 08/16/2013    Procedure: COLONOSCOPY;  Surgeon: Theda Belfast, MD;  Location: [facility] ENDOSCOPY;  Service: Endoscopy;  Laterality: N/A;  . Hematoma evacuation Right 09/10/2013    Procedure: EVACUATION OF RIGHT LEG HEMATOMA AND EXCISION DEBRIDIMENT RIGHT LEG ;  Surgeon: Robyne Askew, MD;  Location: [facility] ORS;  Service: General;  Laterality: Right;  . Debridement and closure wound Right 09/22/2013    Procedure: IRRIGATION AND DEBRIDEMENT SURGICAL PREP PLACEMENT OF ACELL AND VAC  ;  Surgeon: Glenna Fellows, MD;  Location: [facility] ORS;  Service: Plastics;  Laterality: Right;  . Skin split graft Right 10/07/2013    Procedure: SPLIT THICKNESS SKIN GRAFT RIGHT THIGH TO RIGHT LEG, PLACEMENT OF A CEL AND WOUND VAC TO RIGHT THIGH  ;  Surgeon: Glenna Fellows, MD;  Location: [facility] ORS;  Service: Plastics;  Laterality: Right;   Family History  Problem Relation Age of Onset  . Heart attack Father   . Asthma Father   . Heart disease Paternal  Uncle   . Rectal cancer Paternal Aunt   . Other Mother     mva   History  Substance Use Topics  . Smoking status: Former Smoker -- 0.12 packs/day for 12 years    Types: Cigarettes    Quit date: 07/05/1982  . Smokeless tobacco: Never Used     Comment: smoked for 1.5 yrs about 2 cigs a day ,only if stressed  . Alcohol Use: No   OB History   Grav Para Term Preterm Abortions TAB SAB Ect Mult Living                 Review of Systems  Allergies  Daptomycin; Morphine and related; Cephalexin; Celecoxib; Codeine; Fluoxetine hcl; Latex; Ofloxacin; Penicillins; Rofecoxib; Sulfonamide derivatives; and Tramadol  Home Medications   Current Outpatient Rx  Name  Route  Sig  Dispense  Refill  . albuterol (PROVENTIL HFA;VENTOLIN HFA) 108 (90 BASE) MCG/ACT inhaler   Inhalation   Inhale 2 puffs into the lungs every 6 (six) hours as needed for wheezing or shortness of breath.          [name]  albuterol (PROVENTIL) (2.5 MG/3ML) 0.083% nebulizer solution   Nebulization   Take 2.5 mg by nebulization every 6 (six) hours as needed for wheezing or shortness of breath.         . allopurinol (ZYLOPRIM) 100 MG tablet   Oral   Take 100 mg by mouth daily. Patient takes at 0800am daily         . amiodarone (PACERONE) 200 MG tablet   Oral   Take 200 mg by mouth daily. Patient takes at 0800am daily         . amLODipine (NORVASC) 10 MG tablet   Oral   Take 10 mg by mouth daily. Patient takes at 0800am daily         . aspirin 325 MG tablet   Oral   Take 325 mg by mouth daily.         [name] atorvastatin (LIPITOR) 20 MG tablet   Oral   Take 20 mg by mouth at bedtime. Patient takes at 2100         . diphenhydrAMINE (BENADRYL) 25 mg capsule   Oral   Take 25 mg by mouth every 4 (four) hours as needed for itching.         . diphenhydrAMINE-zinc acetate (BENADRYL) cream   Topical   Apply 1 application topically 3 (three) times daily as needed for itching.         . docusate sodium (COLACE) 100 MG capsule   Oral   Take 100 mg by mouth 2 (two) times daily.         . ferrous sulfate 325 (65 FE) MG tablet   Oral   Take 325 mg by mouth 3 (three) times daily with meals. Patient takes at 0800, 1200, and 1700         . furosemide (LASIX) 40 MG tablet   Oral   Take 40 mg by mouth 2 (two) times daily. Patient takes at 0800am daily         . hydrALAZINE (APRESOLINE) 50 MG tablet   Oral   Take 50 mg by mouth 3 (three) times daily.         . insulin glargine (LANTUS) 100 UNIT/ML injection   Subcutaneous   Inject 15 Units into the skin 2 (two) times daily.         . isosorbide dinitrate (ISORDIL)  10 MG tablet   Oral   Take 1 tablet (10 mg total) by mouth 2 (two) times daily.         . metoCLOPramide (REGLAN) 5 MG tablet   Oral   Take 5 mg by mouth 3 (three) times daily.         . metoprolol tartrate (LOPRESSOR) 25 MG tablet   Oral   Take 12.5 mg by mouth  2 (two) times daily.         [name] omeprazole (PRILOSEC) 20 MG capsule   Oral   Take 20 mg by mouth daily.         . ondansetron (ZOFRAN) 4 MG tablet   Oral   Take 4 mg by mouth every 6 (six) hours as needed for nausea or vomiting.         [name] oxyCODONE-acetaminophen (PERCOCET/ROXICET) 5-325 MG per tablet   Oral   Take 2 tablets by mouth every 6 (six) hours as needed for severe pain.         . pantoprazole (PROTONIX) 40 MG tablet   Oral   Take 40 mg by mouth daily.         . polyethylene glycol (MIRALAX / GLYCOLAX) packet   Oral   Take 17 g by mouth 2 (two) times daily.         . potassium chloride (KLOR-CON) 20 MEQ packet   Oral   Take 20 mEq by mouth daily.         . traZODone (DESYREL) 50 MG tablet   Oral   Take 25 mg by mouth at bedtime.          BP 123/68  Pulse 71  Temp(Src) 97.1 F (36.2 C) (Rectal)  Resp 25  SpO2 100% Physical Exam  ED Course  Procedures (including critical care time) Labs Review Labs Reviewed  CBC WITH DIFFERENTIAL - Abnormal; Notable for the following:    WBC 14.7 (*)    RBC 3.26 (*)    Hemoglobin 8.8 (*)    HCT 29.7 (*)    MCHC 29.6 (*)    RDW 18.3 (*)    Neutrophils Relative % 91 (*)    Neutro Abs 13.5 (*)    Lymphocytes Relative 5 (*)    Monocytes Relative 2 (*)    All other components within normal limits  POCT I-STAT, CHEM 8 - Abnormal; Notable for the following:    BUN 46 (*)    Creatinine, Ser 2.10 (*)    Glucose, Bld 211 (*)    Hemoglobin 10.2 (*)    HCT 30.0 (*)    All other components within normal limits  POCT I-STAT 3, BLOOD GAS (G3+) - Abnormal; Notable for the following:    pH, Arterial 7.206 (*)    pCO2 arterial 54.0 (*)    pO2, Arterial 167.0 (*)    Acid-base deficit 6.0 (*)    All other components within normal limits  URINALYSIS, ROUTINE W REFLEX MICROSCOPIC  BLOOD GAS, ARTERIAL  POCT I-STAT TROPONIN I   Imaging Review Dg Chest Portable 1 View  12/03/2013   CLINICAL DATA:  62 year old  female with respiratory distress. Shortness of breath. Initial encounter.  EXAM: PORTABLE CHEST - 1 VIEW  COMPARISON:  11/17/2013 and earlier.  FINDINGS: Seated AP portable view at 1033 hrs. Mixed interstitial and airspace opacity in both lungs, increased in confluence since earlier this month. Chronic cardiomegaly. Other mediastinal contours are within normal limits. No pneumothorax. No large pleural effusion.  IMPRESSION: More confluent bilateral pulmonary opacity, mixed interstitial and airspace appearance. Favor acute/worsening pulmonary edema over bilateral pneumonia.   Electronically Signed   By: Augusto Gamble M.D.   On: 12/03/2013 10:43    EKG Interpretation   None      11:50 AM breathing is much improved after treatment BiPAP. She appears more comfortable. Is able to speak in sentences. Intravenous Lasix, nitroglycerin paste ordered. Chest x-ray viewed by me MDM  No diagnosis found. Spoke with Dr.Dhungel admit step down unit Diagnosis #1 acute respiratory failure #2 congestive heart failure #3 chronic renal insufficiency #4 anemia CRITICAL CARE Performed by: Doug Sou Total critical care time: 40 minute Critical care time was exclusive of separately billable procedures and treating other patients. Critical care was necessary to treat or prevent imminent or life-threatening deterioration. Critical care was time spent personally by me on the following activities: development of treatment plan with patient and/or surrogate as well as nursing, discussions with consultants, evaluation of patient's response to treatment, examination of patient, obtaining history from patient or surrogate, ordering and performing treatments and interventions, ordering and review of laboratory studies, ordering and review of radiographic studies, pulse oximetry and re-evaluation of patient's condition.    Doug Sou, MD 12/03/13 1204

## 2013-12-04 LAB — INFLUENZA PANEL BY PCR (TYPE A & B)
H1N1 flu by pcr: NOT DETECTED
Influenza A By PCR: NEGATIVE

## 2013-12-04 LAB — BLOOD GAS, ARTERIAL
Acid-base deficit: 4.7 mmol/L — ABNORMAL HIGH (ref 0.0–2.0)
Drawn by: 347641
Mode: POSITIVE
O2 Saturation: 99.1 %
RATE: 8 resp/min
TCO2: 23 mmol/L (ref 0–100)
pCO2 arterial: 49.4 mmHg — ABNORMAL HIGH (ref 35.0–45.0)
pH, Arterial: 7.257 — ABNORMAL LOW (ref 7.350–7.450)
pO2, Arterial: 209 mmHg — ABNORMAL HIGH (ref 80.0–100.0)

## 2013-12-04 LAB — CBC WITH DIFFERENTIAL/PLATELET
Basophils Absolute: 0 10*3/uL (ref 0.0–0.1)
Eosinophils Relative: 2 % (ref 0–5)
Hemoglobin: 7.5 g/dL — ABNORMAL LOW (ref 12.0–15.0)
Lymphocytes Relative: 6 % — ABNORMAL LOW (ref 12–46)
Lymphs Abs: 0.5 10*3/uL — ABNORMAL LOW (ref 0.7–4.0)
MCV: 92.2 fL (ref 78.0–100.0)
Neutro Abs: 6.9 10*3/uL (ref 1.7–7.7)
Neutrophils Relative %: 86 % — ABNORMAL HIGH (ref 43–77)
Platelets: 193 10*3/uL (ref 150–400)
RBC: 2.82 MIL/uL — ABNORMAL LOW (ref 3.87–5.11)
RDW: 18.4 % — ABNORMAL HIGH (ref 11.5–15.5)
WBC: 8 10*3/uL (ref 4.0–10.5)

## 2013-12-04 LAB — BASIC METABOLIC PANEL
CO2: 21 mEq/L (ref 19–32)
Calcium: 8.7 mg/dL (ref 8.4–10.5)
GFR calc Af Amer: 23 mL/min — ABNORMAL LOW (ref >90)
GFR calc non Af Amer: 20 mL/min — ABNORMAL LOW (ref >90)
Sodium: 143 mEq/L (ref 135–145)

## 2013-12-04 LAB — GLUCOSE, CAPILLARY
Glucose-Capillary: 95 mg/dL (ref 70–99)
Glucose-Capillary: 87 mg/dL (ref 70–99)
Glucose-Capillary: 88 mg/dL (ref 70–99)

## 2013-12-04 MED ORDER — ALBUTEROL SULFATE (2.5 MG/3ML) 0.083% IN NEBU: 2.5000 mg | INHALATION_SOLUTION | RESPIRATORY_TRACT | Status: DC | PRN

## 2013-12-04 MED ORDER — INSULIN ASPART 100 UNIT/ML ~~LOC~~ SOLN
0.0000 [IU] | Freq: Three times a day (TID) | SUBCUTANEOUS | Status: DC
  Administered 2013-12-05 – 2013-12-07 (×2): 2 [IU] via SUBCUTANEOUS
  Administered 2013-12-09 (×2): 3 [IU] via SUBCUTANEOUS
  Administered 2013-12-10 – 2013-12-11 (×4): 2 [IU] via SUBCUTANEOUS

## 2013-12-04 MED ORDER — INSULIN ASPART 100 UNIT/ML ~~LOC~~ SOLN: 0.0000 [IU] | SUBCUTANEOUS | Status: DC

## 2013-12-04 NOTE — Progress Notes (Signed)
RT decreased Fi02 to 60% and increased mandatory rate to 14bpm per ABG results. RT will continue to monitor.

## 2013-12-04 NOTE — Progress Notes (Signed)
[facility] Progress Note Central TEAM 1 - Stepdown/ICU TEAM   ALEATHA TAITE ZOX:[id number] DOB: 1951-09-03 DOA: 12/03/2013 PCP: Willey Blade, MD  Admit HPI / Brief Narrative: 62 year old obese female with history of acute mild systolic and diastolic failure, type 2 diabetes mellitus, CKD stage IV, paroxysmal A. fib (not on anticoagulation due to GI bleed and leg hematoma), chronic anemia, obstructive sleep apnea/OHS noncompliant with CPAP, hypertension, and a recent hospitalization for respiratory failure associated with CHF who was brought to the ED with worsening shortness of breath over 24hrs.   EMS was called and as she was found to be tachypneic and tachycardic.  She was placed on BiPAP and brought to the ED. An ABG done in the ED showed a pH of 7.2, PCO2 of 54 and PO2 of 167. Patient O2 sats were stable on BiPAP. Blood work showed leukocytosis with WBC of 14.7, hemoglobin of 10.2, platelets of 216. Sodium of 145, potassium 4.2, chloride 112, BUN of 46, creatinine of 2.1 and glucose of 211. BNP was 1900. Chest x-ray showed bilateral pulmonary opacity suggestive likely of pulmonary edema with possible bilateral pneumonia.   HPI/Subjective: The patient is awake and alert.  She reports that she feels much better.  Her primary complaint is not having been allowed to eat yet.  She denies chest pain nausea vomiting or abdominal pain.  Assessment/Plan:  Acute hypoxic and hypercarbic respiratory failure Much improved w/ diuresis - now off BIPAP   Acute diastolic CHF exacerbation EF 50-55% via TTE 10/15/2013 - at time of discharge 11/22/13 wgt was 118.4 kg - weight at presentation 131kg - cont to diurese and follow I/Os and daily wgts   OHS/SA Refuses CPAP - likely has significant component of pulmonary hypertension/cor pulmonale further exacerbating her volume overload  Possible B HCAP Pt is afebrile w/ normal WBC - doubt this represents PNA - cont empiric abx for now - follow  clinically w/ diuresis   HTN Follow with titration of diuretic  DM2 w/ renal complications CBG well controlled at the present time  CKD (chronic kidney disease), stage IV Baseline creatinine appears to be approximately 2.7  Anemia Hemoglobin was as low as 7.5 dating back to 11/19/2013  Chronic Afib  Not an anticoagulation due to GI bleed and leg hematoma - rate controlled  Morbid obesity - Body mass index is 50 kg/(m^2).  Wound of right leg Calf hematoma Nov 2014 requiring surgical I&D w skin graft for closure - wound appears to be in great shape  Code Status: FULL Family Communication: no family present at time of exam Disposition Plan: SDU with probable transfer to telemetry bed in a.m.  Consultants: None  Procedures: None  Antibiotics: Levaquin 12/26 >> Vancomycin 12/26 >>  DVT prophylaxis: SQ heparin   Objective: Blood pressure 142/49, pulse 56, temperature 98.1 F (36.7 C), temperature source Oral, resp. rate 10, height 5\' 3"  (1.6 m), weight 128 kg (282 lb 3 oz), SpO2 96.00%.  Intake/Output Summary (Last 24 hours) at 12/04/13 1718 Last data filed at 12/04/13 0600  Gross per 24 hour  Intake 165.33 ml  Output    725 ml  Net -559.67 ml   Exam: General: No acute respiratory distress while at rest Lungs: Very distant breath sounds throughout all fields with bibasilar crackles but no wheeze Cardiovascular: Regular rate without murmur gallop or rub normal S1 and S2 Abdomen: morbidly obese, nontender, nondistended, soft, bowel sounds positive, no rebound, no ascites, no appreciable mass Extremities: No significant cyanosis, or  clubbing;  2+ edema bilateral lower extremities  Data Reviewed: Basic Metabolic Panel:  Recent Labs Lab 12/03/13 1052 12/04/13 0445  NA 145 143  K 4.2 4.8  CL 112 111  CO2  --  21  GLUCOSE 211* 89  BUN 46* 49*  CREATININE 2.10* 2.41*  CALCIUM  --  8.7   Liver Function Tests: No results found for this basename: AST, ALT,  ALKPHOS, BILITOT, PROT, ALBUMIN,  in the last 168 hours  CBC:  Recent Labs Lab 12/03/13 1040 12/03/13 1052 12/04/13 0445  WBC 14.7*  --  8.0  NEUTROABS 13.5*  --  6.9  HGB 8.8* 10.2* 7.5*  HCT 29.7* 30.0* 26.0*  MCV 91.1  --  92.2  PLT 216  --  193   Cardiac Enzymes: No results found for this basename: CKTOTAL, CKMB, CKMBINDEX, TROPONINI,  in the last 168 hours  BNP (last 3 results)  Recent Labs  11/09/13 1605 11/17/13 1052 12/03/13 1329  PROBNP 3130.0* 2548.0* 1911.0*   CBG:  Recent Labs Lab 12/03/13 2139 12/04/13 0435 12/04/13 0738 12/04/13 1153 12/04/13 1623  GLUCAP 105* 95 94 87 84    Recent Results (from the past 240 hour(s))  CULTURE, BLOOD (ROUTINE X 2)     Status: None   Collection Time    12/03/13  1:15 PM      Result Value Range Status   Specimen Description BLOOD ARM RIGHT   Final   Special Requests BOTTLES DRAWN AEROBIC AND ANAEROBIC 10CC   Final   Culture  Setup Time     Final   Value: 12/03/2013 16:58     Performed at [facility]   Culture     Final   Value:        BLOOD CULTURE RECEIVED NO GROWTH TO DATE CULTURE WILL BE HELD FOR 5 DAYS BEFORE ISSUING A FINAL NEGATIVE REPORT     Performed at [facility]   Report Status PENDING   Incomplete  CULTURE, BLOOD (ROUTINE X 2)     Status: None   Collection Time    12/03/13  1:30 PM      Result Value Range Status   Specimen Description BLOOD HAND RIGHT   Final   Special Requests BOTTLES DRAWN AEROBIC AND ANAEROBIC 10CC   Final   Culture  Setup Time     Final   Value: 12/03/2013 16:57     Performed at [facility]   Culture     Final   Value:        BLOOD CULTURE RECEIVED NO GROWTH TO DATE CULTURE WILL BE HELD FOR 5 DAYS BEFORE ISSUING A FINAL NEGATIVE REPORT     Performed at [facility]   Report Status PENDING   Incomplete     Studies:  Recent x-ray studies have been reviewed in detail by the Attending Physician  Scheduled Meds:  Scheduled Meds: .  albuterol  2.5 mg Nebulization TID  . allopurinol  100 mg Oral Daily  . amiodarone  200 mg Oral Daily  . amLODipine  10 mg Oral Daily  . aspirin  325 mg Oral Daily  . atorvastatin  20 mg Oral QHS  . docusate sodium  100 mg Oral BID  . ferrous sulfate  325 mg Oral TID WC  . furosemide  80 mg Intravenous BID  . heparin  5,000 Units Subcutaneous Q8H  . hydrALAZINE  50 mg Oral TID  . insulin aspart  0-15 Units Subcutaneous Q4H  .  insulin glargine  15 Units Subcutaneous BID  . isosorbide dinitrate  10 mg Oral BID  . [START ON 12/05/2013] levofloxacin (LEVAQUIN) IV  750 mg Intravenous Q48H  . metoCLOPramide  5 mg Oral TID  . metoprolol tartrate  12.5 mg Oral BID  . pantoprazole  40 mg Oral Daily  . polyethylene glycol  17 g Oral BID  . potassium chloride  20 mEq Oral Daily  . sodium chloride  3 mL Intravenous Q12H  . traZODone  25 mg Oral QHS  . vancomycin  1,750 mg Intravenous Q24H    Time spent on care of this patient: 35 mins   [facility] T  [facility] Office  [phone] Pager - Text Page per Loretha Stapler as per below:  On-Call/Text Page:      [email or web address]      password [email or web address]  If 7PM-7AM, please contact night-coverage [email or web address] Password [email or web address] 12/04/2013, 5:18 PM   LOS: 1 day

## 2013-12-05 LAB — COMPREHENSIVE METABOLIC PANEL
AST: 7 U/L (ref 0–37)
Albumin: 2.5 g/dL — ABNORMAL LOW (ref 3.5–5.2)
Alkaline Phosphatase: 95 U/L (ref 39–117)
BUN: 53 mg/dL — ABNORMAL HIGH (ref 6–23)
Calcium: 8.4 mg/dL (ref 8.4–10.5)
Chloride: 110 mEq/L (ref 96–112)
Creatinine, Ser: 2.65 mg/dL — ABNORMAL HIGH (ref 0.50–1.10)
GFR calc Af Amer: 21 mL/min — ABNORMAL LOW (ref >90)
Potassium: 4.8 mEq/L (ref 3.5–5.1)
Total Protein: 6.9 g/dL (ref 6.0–8.3)

## 2013-12-05 LAB — GLUCOSE, CAPILLARY
Glucose-Capillary: 117 mg/dL — ABNORMAL HIGH (ref 70–99)
Glucose-Capillary: 87 mg/dL (ref 70–99)
Glucose-Capillary: 90 mg/dL (ref 70–99)

## 2013-12-05 MED ORDER — ALPRAZOLAM 0.25 MG PO TABS
0.2500 mg | ORAL_TABLET | Freq: Three times a day (TID) | ORAL | Status: DC | PRN
  Administered 2013-12-05 – 2013-12-10 (×4): 0.25 mg via ORAL
  Filled 2013-12-05 (×4): qty 1

## 2013-12-05 MED ORDER — METOPROLOL TARTRATE 12.5 MG HALF TABLET
12.5000 mg | ORAL_TABLET | Freq: Two times a day (BID) | ORAL | Status: DC
  Administered 2013-12-05 – 2013-12-06 (×2): 12.5 mg via ORAL
  Filled 2013-12-05 (×3): qty 1

## 2013-12-05 MED ORDER — LEVOFLOXACIN 750 MG PO TABS
750.0000 mg | ORAL_TABLET | ORAL | Status: DC
  Filled 2013-12-05: qty 1

## 2013-12-05 NOTE — Progress Notes (Signed)
Chaplain requested to offer emotional support to pt whom RN said was "teary." Pt said she was feeling "depressed and discouraged." She is eager to go home and requested prayer. After prayer, pt disclosed that her husband is cheating on her, and she "feels in her gut he's lying" about having ended the affair. Pt asked chaplain to call her minister's wife and ask her to come visit. Chaplain confirmed pt's permission to release her name and room number to [facility], [phone]. Chaplain called and left voicemail asking to be called back. Pt was very appreciative of spiritual support.   Guy Sandifer [place], [place] [phone]

## 2013-12-05 NOTE — Progress Notes (Signed)
[facility] Progress Note [place] TEAM 1 - Stepdown/ICU TEAM   [name] GNF:[id number] DOB: 1951/09/05 DOA: 12/03/2013 PCP: Willey Blade, MD  Admit HPI / Brief Narrative: 63 year old obese female with history of acute mild systolic and diastolic failure, type 2 diabetes mellitus, CKD stage IV, paroxysmal A. fib (not on anticoagulation due to GI bleed and leg hematoma), chronic anemia, obstructive sleep apnea/OHS noncompliant with CPAP, hypertension, and a recent hospitalization for respiratory failure associated with CHF who was brought to the ED with worsening shortness of breath over 24hrs.   EMS was called and as she was found to be tachypneic and tachycardic.  She was placed on BiPAP and brought to the ED. An ABG done in the ED showed a pH of 7.2, PCO2 of 54 and PO2 of 167. Patient O2 sats were stable on BiPAP. Blood work showed leukocytosis with WBC of 14.7, hemoglobin of 10.2, platelets of 216. Sodium of 145, potassium 4.2, chloride 112, BUN of 46, creatinine of 2.1 and glucose of 211. BNP was 1900. Chest x-ray showed bilateral pulmonary opacity suggestive likely of pulmonary edema with possible bilateral pneumonia.   HPI/Subjective: The patient is awake and alert.  She is very anxious today stating "I feel like if I go to sleep I will die."  She denies chest pain nausea vomiting or abdominal pain.  Assessment/Plan:  Acute hypoxic and hypercarbic respiratory failure Much improved w/ diuresis - now off BIPAP - appears to be primarily volume related  Acute diastolic CHF exacerbation EF 50-55% via TTE 10/15/2013 - at time of discharge 11/22/13 wgt was 118.4 kg - weight at presentation 131kg - cont to diurese and follow I/Os and daily wgts - net negative 2L thus far - wgt not recorded today - followup chest x-ray in a.m.  OHS/SA Has refused CPAP in the past - I had a lengthy discussion with her concerning the absolute need for nightly CPAP use and the fact that failure to do  so will unquestionably shorten her life and lead to more frequent episodes of respiratory distress- likely has significant component of pulmonary hypertension/cor pulmonale further exacerbating her volume overload - she has agreed to retry CPAP nightly  Possible B HCAP Pt is afebrile w/ normal WBC - doubt this represents PNA - discontinue antibiotic therapy - follow clinically w/ diuresis   HTN Follow with titration of diuretic  DM2 w/ renal complications CBG well controlled at the present time  CKD (chronic kidney disease), stage IV Baseline creatinine appears to be approximately 2.7 - follow with ongoing diuresis  Anemia Hemoglobin was as low as 7.5 dating back to 11/19/2013 - follow trend - no evidence of blood loss  Chronic Afib >> sinus brady  Not an anticoagulation due to GI bleed and leg hematoma - rate a bit slow at present - titrate down rate controlling meds and follow   Morbid obesity - Body mass index is 50.47 kg/(m^2).  Wound of right leg Calf hematoma Nov 2014 requiring surgical I&D w skin graft for closure - wound appears to be in great shape  Code Status: FULL Family Communication: no family present at time of exam Disposition Plan: SDU   Consultants: None  Procedures: None  Antibiotics: Levaquin 12/26 >> 12/28 Vancomycin 12/26 >> 12/28  DVT prophylaxis: SQ heparin   Objective: Blood pressure 160/48, pulse 52, temperature 98 F (36.7 C), temperature source Oral, resp. rate 14, height 5\' 3"  (1.6 m), weight 129.2 kg (284 lb 13.4 oz), SpO2 91.00%.  Intake/Output Summary (Last 24 hours) at 12/05/13 1439 Last data filed at 12/05/13 1157  Gross per 24 hour  Intake    240 ml  Output   1725 ml  Net  -1485 ml   Exam: General: No acute respiratory distress while at rest Lungs: Very distant breath sounds throughout all fields with bibasilar crackles but no wheeze Cardiovascular: bradycardic rate without murmur gallop or rub appreciable - hs are quite  distant  Abdomen: morbidly obese, nontender, nondistended, soft, bowel sounds positive, no rebound, no ascites, no appreciable mass Extremities: no significant cyanosis, or clubbing;  2+ edema bilateral lower extremities  Data Reviewed: Basic Metabolic Panel:  Recent Labs Lab 12/03/13 1052 12/04/13 0445 12/05/13 0555  NA 145 143 142  K 4.2 4.8 4.8  CL 112 111 110  CO2  --  21 23  GLUCOSE 211* 89 85  BUN 46* 49* 53*  CREATININE 2.10* 2.41* 2.65*  CALCIUM  --  8.7 8.4   Liver Function Tests:  Recent Labs Lab 12/05/13 0555  AST 7  ALT 6  ALKPHOS 95  BILITOT 0.2*  PROT 6.9  ALBUMIN 2.5*    CBC:  Recent Labs Lab 12/03/13 1040 12/03/13 1052 12/04/13 0445  WBC 14.7*  --  8.0  NEUTROABS 13.5*  --  6.9  HGB 8.8* 10.2* 7.5*  HCT 29.7* 30.0* 26.0*  MCV 91.1  --  92.2  PLT 216  --  193   BNP (last 3 results)  Recent Labs  11/09/13 1605 11/17/13 1052 12/03/13 1329  PROBNP 3130.0* 2548.0* 1911.0*   CBG:  Recent Labs Lab 12/04/13 2048 12/05/13 0039 12/05/13 0340 12/05/13 0711 12/05/13 1202  GLUCAP 88 90 85 87 138*    Recent Results (from the past 240 hour(s))  CULTURE, BLOOD (ROUTINE X 2)     Status: None   Collection Time    12/03/13  1:15 PM      Result Value Range Status   Specimen Description BLOOD ARM RIGHT   Final   Special Requests BOTTLES DRAWN AEROBIC AND ANAEROBIC 10CC   Final   Culture  Setup Time     Final   Value: 12/03/2013 16:58     Performed at [facility]   Culture     Final   Value:        BLOOD CULTURE RECEIVED NO GROWTH TO DATE CULTURE WILL BE HELD FOR 5 DAYS BEFORE ISSUING A FINAL NEGATIVE REPORT     Performed at [facility]   Report Status PENDING   Incomplete  CULTURE, BLOOD (ROUTINE X 2)     Status: None   Collection Time    12/03/13  1:30 PM      Result Value Range Status   Specimen Description BLOOD HAND RIGHT   Final   Special Requests BOTTLES DRAWN AEROBIC AND ANAEROBIC 10CC   Final   Culture   Setup Time     Final   Value: 12/03/2013 16:57     Performed at [facility]   Culture     Final   Value:        BLOOD CULTURE RECEIVED NO GROWTH TO DATE CULTURE WILL BE HELD FOR 5 DAYS BEFORE ISSUING A FINAL NEGATIVE REPORT     Performed at [facility]   Report Status PENDING   Incomplete  URINE CULTURE     Status: None   Collection Time    12/03/13  6:32 PM      Result Value  Range Status   Specimen Description URINE, CATHETERIZED   Final   Special Requests NONE   Final   Culture  Setup Time     Final   Value: 12/04/2013 01:50     Performed at [facility] Count     Final   Value: >=100,000 COLONIES/ML     Performed at [facility]   Culture     Final   Value: ESCHERICHIA COLI     Performed at [facility]   Report Status PENDING   Incomplete     Studies:  Recent x-ray studies have been reviewed in detail by the Attending Physician  Scheduled Meds:  Scheduled Meds: . albuterol  2.5 mg Nebulization TID  . allopurinol  100 mg Oral Daily  . amiodarone  200 mg Oral Daily  . amLODipine  10 mg Oral Daily  . aspirin  325 mg Oral Daily  . atorvastatin  20 mg Oral QHS  . docusate sodium  100 mg Oral BID  . ferrous sulfate  325 mg Oral TID WC  . furosemide  80 mg Intravenous BID  . heparin  5,000 Units Subcutaneous Q8H  . hydrALAZINE  50 mg Oral TID  . insulin aspart  0-15 Units Subcutaneous TID WC  . insulin glargine  15 Units Subcutaneous BID  . isosorbide dinitrate  10 mg Oral BID  . metoCLOPramide  5 mg Oral TID  . metoprolol tartrate  12.5 mg Oral BID  . pantoprazole  40 mg Oral Daily  . polyethylene glycol  17 g Oral BID  . potassium chloride  20 mEq Oral Daily  . traZODone  25 mg Oral QHS    Time spent on care of this patient: 35 mins   [facility] T  [facility] Office  [phone] Pager - Text Page per Loretha Stapler as per below:  On-Call/Text Page:      [email or web address]      password [email or web address]  If 7PM-7AM,  please contact night-coverage [email or web address] Password [email or web address] 12/05/2013, 2:39 PM   LOS: 2 days

## 2013-12-06 ENCOUNTER — Inpatient Hospital Stay (HOSPITAL_COMMUNITY): Payer: Medicare HMO

## 2013-12-06 LAB — GLUCOSE, CAPILLARY: Glucose-Capillary: 85 mg/dL (ref 70–99)

## 2013-12-06 LAB — BASIC METABOLIC PANEL
BUN: 57 mg/dL — ABNORMAL HIGH (ref 6–23)
CO2: 22 mEq/L (ref 19–32)
Calcium: 8.1 mg/dL — ABNORMAL LOW (ref 8.4–10.5)
Chloride: 107 mEq/L (ref 96–112)
Creatinine, Ser: 2.83 mg/dL — ABNORMAL HIGH (ref 0.50–1.10)
GFR calc Af Amer: 19 mL/min — ABNORMAL LOW (ref >90)
GFR calc non Af Amer: 17 mL/min — ABNORMAL LOW (ref >90)
Glucose, Bld: 72 mg/dL (ref 70–99)

## 2013-12-06 LAB — TYPE AND SCREEN
Antibody Screen: POSITIVE
DAT, IgG: NEGATIVE

## 2013-12-06 LAB — CBC
MCH: 26.5 pg (ref 26.0–34.0)
Platelets: 184 10*3/uL (ref 150–400)
RDW: 18.6 % — ABNORMAL HIGH (ref 11.5–15.5)
WBC: 6.1 10*3/uL (ref 4.0–10.5)

## 2013-12-06 LAB — URINE CULTURE: Colony Count: >=100000

## 2013-12-06 MED ORDER — DIPHENHYDRAMINE HCL 25 MG PO CAPS
25.0000 mg | ORAL_CAPSULE | Freq: Three times a day (TID) | ORAL | Status: DC | PRN
  Administered 2013-12-08 – 2013-12-09 (×4): 25 mg via ORAL
  Filled 2013-12-06 (×4): qty 1

## 2013-12-06 MED ORDER — ALBUTEROL SULFATE (5 MG/ML) 0.5% IN NEBU
2.5000 mg | INHALATION_SOLUTION | Freq: Four times a day (QID) | RESPIRATORY_TRACT | Status: DC
  Administered 2013-12-06 (×2): 2.5 mg via RESPIRATORY_TRACT

## 2013-12-06 MED ORDER — ALPRAZOLAM 0.25 MG PO TABS
0.2500 mg | ORAL_TABLET | Freq: Every day | ORAL | Status: DC
  Administered 2013-12-06 – 2013-12-15 (×10): 0.25 mg via ORAL
  Filled 2013-12-06 (×10): qty 1

## 2013-12-06 MED ORDER — FOSFOMYCIN TROMETHAMINE 3 G PO PACK
3.0000 g | PACK | Freq: Once | ORAL | Status: AC
  Administered 2013-12-06: 3 g via ORAL
  Filled 2013-12-06: qty 3

## 2013-12-06 NOTE — Progress Notes (Signed)
Utilization review completed.  

## 2013-12-06 NOTE — Progress Notes (Signed)
Pt removed CPAP, refusing it for rest of night. This RN explained the implications of removal (deterioration of pt's condition). Pt still refused reapplication of CPAP, 5L [place] applied. Will continue to monitor and assess.

## 2013-12-06 NOTE — Progress Notes (Signed)
Placed patient on bipap 16/6, 40%. Patient is tolerating bipap well at this time. RT will continue to monitor.

## 2013-12-06 NOTE — Progress Notes (Addendum)
Pt was in bed when I arrived, sitting up. Pt said "they said I have fluid on my lungs because I don't use the cpac". I asked her why not and she said she uses it about four hours and then it feels like she is choking and she gets afraid. I encouraged pt to continue to use it and follow directions because it is all to help her. I also encouraged her to release her fears, of which she spoke. Pt said she was afraid and I encouraged her to talk about that. Pt said she was afraid she was not going to be here. We talked about that fear and how to release it through breathing and meditation.  Had prayer w/pt. Pt was very thankful for visit.   Marjory Lies Chaplain  12/06/13 0900  Clinical Encounter Type  Visited With Patient

## 2013-12-06 NOTE — Progress Notes (Addendum)
[facility] Progress Note [place] TEAM 1 - Stepdown/ICU TEAM   [name] WUJ:[id number] DOB: 1951-06-01 DOA: 12/03/2013 PCP: Willey Blade, MD  Admit HPI / Brief Narrative: 62 year old obese female with history of acute mild systolic and diastolic failure, type 2 diabetes mellitus, CKD stage IV, paroxysmal A. fib (not on anticoagulation due to GI bleed and leg hematoma), chronic anemia, obstructive sleep apnea/OHS noncompliant with CPAP, hypertension, and a recent hospitalization for respiratory failure associated with CHF who was brought to the ED with worsening shortness of breath over 24hrs.   EMS was called and as she was found to be tachypneic and tachycardic.  She was placed on BiPAP and brought to the ED. An ABG done in the ED showed a pH of 7.2, PCO2 of 54 and PO2 of 167. Patient O2 sats were stable on BiPAP. Blood work showed leukocytosis with WBC of 14.7, hemoglobin of 10.2, platelets of 216. Sodium of 145, potassium 4.2, chloride 112, BUN of 46, creatinine of 2.1 and glucose of 211. BNP was 1900. Chest x-ray showed bilateral pulmonary opacity suggestive likely of pulmonary edema with possible bilateral pneumonia.   HPI/Subjective: The patient is awake and alert.  She was only able to use the CPAP for a few hours last night and then removed it due to anxiety.  She denies current chest pain.  She states that she is slowly beginning to feel better.  Assessment/Plan:  Acute hypoxic and hypercarbic respiratory failure Improved w/ diuresis - now off BIPAP - appears to be primarily volume related - continues to require 5 L nasal cannula  Acute diastolic CHF exacerbation EF 50-55% via TTE 10/15/2013 - at time of discharge 11/22/13 wgt was 118.4 kg - weight at presentation 131kg - cont to diurese and follow I/Os and daily wgts - net negative 2.4L thus far - wgt not recorded today - followup chest x-ray w/o significant change   OHS/SA Has refused CPAP in the past - I had another  lengthy discussion with her concerning the absolute need for nightly CPAP use and the fact that failure to do so will unquestionably shorten her life and lead to more frequent episodes of respiratory distress- likely has significant component of pulmonary hypertension/cor pulmonale further exacerbating her volume overload - she has agreed to retry CPAP nightly with a mild anxiolytic to help   Possible B HCAP Pt is afebrile w/ normal WBC - doubt this represents PNA - discontinued antibiotic therapy - follow clinically w/ diuresis   Multidrug resistant E coli UTI UA was not convincing of UTI, but cx has revealed >100k colonies of E coli - will dose w/ fosfomycin as pt is allergic to multp drugs and bacteria is resistant to many others  HTN Follow with titration of diuretic  DM2 w/ renal complications CBG well controlled at the present time  CKD (chronic kidney disease), stage IV Baseline creatinine appears to be approximately 2.7 - follow with ongoing diuresis  Anemia Hemoglobin was as low as 7.5 dating back to 11/19/2013 - follow trend - no evidence of blood loss  Chronic Afib >> sinus brady  Not an anticoagulation due to GI bleed and leg hematoma - rate remains a bit slow at present - titrate down rate controlling meds further and follow   Morbid obesity - Body mass index is 50.78 kg/(m^2).  Wound of right leg Calf hematoma Nov 2014 requiring surgical I&D w skin graft for closure - wound appears to be in great shape  Code Status:  FULL Family Communication: no family present at time of exam Disposition Plan: SDU   Consultants: None  Procedures: None  Antibiotics: Levaquin 12/26 >> 12/28 Vancomycin 12/26 >> 12/28  DVT prophylaxis: SQ heparin   Objective: Blood pressure 138/44, pulse 54, temperature 98.2 F (36.8 C), temperature source Oral, resp. rate 15, height 5\' 3"  (1.6 m), weight 130 kg (286 lb 9.6 oz), SpO2 97.00%.  Intake/Output Summary (Last 24 hours) at 12/06/13  1548 Last data filed at 12/06/13 1100  Gross per 24 hour  Intake    600 ml  Output   1250 ml  Net   -650 ml   Exam: General: No acute respiratory distress while at rest - appears anxious  Lungs: Very distant breath sounds throughout all fields with bibasilar crackles but no wheeze Cardiovascular: bradycardic rate without murmur gallop or rub appreciable - HS are quite distant  Abdomen: morbidly obese, nontender, nondistended, soft, bowel sounds positive, no rebound, no ascites, no appreciable mass Extremities: no significant cyanosis, or clubbing;  1 plus edema bilateral lower extremities with cutaneous change of chronic venous stasis  Data Reviewed: Basic Metabolic Panel:  Recent Labs Lab 12/03/13 1052 12/04/13 0445 12/05/13 0555 12/06/13 0400  NA 145 143 142 139  K 4.2 4.8 4.8 4.7  CL 112 111 110 107  CO2  --  21 23 22   GLUCOSE 211* 89 85 72  BUN 46* 49* 53* 57*  CREATININE 2.10* 2.41* 2.65* 2.83*  CALCIUM  --  8.7 8.4 8.1*   Liver Function Tests:  Recent Labs Lab 12/05/13 0555  AST 7  ALT 6  ALKPHOS 95  BILITOT 0.2*  PROT 6.9  ALBUMIN 2.5*   CBC:  Recent Labs Lab 12/03/13 1040 12/03/13 1052 12/04/13 0445 12/06/13 0559  WBC 14.7*  --  8.0 6.1  NEUTROABS 13.5*  --  6.9  --   HGB 8.8* 10.2* 7.5* 7.2*  HCT 29.7* 30.0* 26.0* 24.8*  MCV 91.1  --  92.2 91.2  PLT 216  --  193 184   BNP (last 3 results)  Recent Labs  11/09/13 1605 11/17/13 1052 12/03/13 1329  PROBNP 3130.0* 2548.0* 1911.0*   CBG:  Recent Labs Lab 12/05/13 1607 12/05/13 2006 12/05/13 2229 12/06/13 0742 12/06/13 1152  GLUCAP 117* 112* 117* 85 111*    Recent Results (from the past 240 hour(s))  CULTURE, BLOOD (ROUTINE X 2)     Status: None   Collection Time    12/03/13  1:15 PM      Result Value Range Status   Specimen Description BLOOD ARM RIGHT   Final   Special Requests BOTTLES DRAWN AEROBIC AND ANAEROBIC 10CC   Final   Culture  Setup Time     Final   Value: 12/03/2013  16:58     Performed at [facility]   Culture     Final   Value:        BLOOD CULTURE RECEIVED NO GROWTH TO DATE CULTURE WILL BE HELD FOR 5 DAYS BEFORE ISSUING A FINAL NEGATIVE REPORT     Performed at [facility]   Report Status PENDING   Incomplete  CULTURE, BLOOD (ROUTINE X 2)     Status: None   Collection Time    12/03/13  1:30 PM      Result Value Range Status   Specimen Description BLOOD HAND RIGHT   Final   Special Requests BOTTLES DRAWN AEROBIC AND ANAEROBIC 10CC   Final   Culture  Setup Time  Final   Value: 12/03/2013 16:57     Performed at [facility]   Culture     Final   Value:        BLOOD CULTURE RECEIVED NO GROWTH TO DATE CULTURE WILL BE HELD FOR 5 DAYS BEFORE ISSUING A FINAL NEGATIVE REPORT     Performed at [facility]   Report Status PENDING   Incomplete  URINE CULTURE     Status: None   Collection Time    12/03/13  6:32 PM      Result Value Range Status   Specimen Description URINE, CATHETERIZED   Final   Special Requests NONE   Final   Culture  Setup Time     Final   Value: 12/04/2013 01:50     Performed at [facility] Count     Final   Value: >=100,000 COLONIES/ML     Performed at [facility]   Culture     Final   Value: ESCHERICHIA COLI     LACTOBACILLUS SPECIES     Note: Standardized susceptibility testing for this organism is not available.     Performed at [facility]   Report Status 12/06/2013 FINAL   Final   Organism ID, Bacteria ESCHERICHIA COLI   Final     Studies:  Recent x-ray studies have been reviewed in detail by the Attending Physician  Scheduled Meds:  Scheduled Meds: . albuterol  2.5 mg Nebulization TID  . allopurinol  100 mg Oral Daily  . amiodarone  200 mg Oral Daily  . amLODipine  10 mg Oral Daily  . aspirin  325 mg Oral Daily  . atorvastatin  20 mg Oral QHS  . docusate sodium  100 mg Oral BID  . ferrous sulfate  325 mg Oral TID WC  . furosemide  80 mg  Intravenous BID  . heparin  5,000 Units Subcutaneous Q8H  . hydrALAZINE  50 mg Oral TID  . insulin aspart  0-15 Units Subcutaneous TID WC  . insulin glargine  15 Units Subcutaneous BID  . isosorbide dinitrate  10 mg Oral BID  . metoCLOPramide  5 mg Oral TID  . metoprolol tartrate  12.5 mg Oral BID  . pantoprazole  40 mg Oral Daily  . polyethylene glycol  17 g Oral BID  . potassium chloride  20 mEq Oral Daily  . traZODone  25 mg Oral QHS    Time spent on care of this patient: 35 mins   [facility] T  [facility] Office  [phone] Pager - Text Page per Loretha Stapler as per below:  On-Call/Text Page:      [email or web address]      password [email or web address]  If 7PM-7AM, please contact night-coverage [email or web address] Password [email or web address] 12/06/2013, 3:48 PM   LOS: 3 days

## 2013-12-06 NOTE — Progress Notes (Signed)
Paged Dr.  Spaniel of [facility] regarding pt's potential Hgb drop of 7.4 to 5.9. This RN already requested lab redraw. Awaiting return page.  MD did not return page but placed order for Type and Screen. Awaiting lab to draw blood. No additional orders received. Of note, pt appears in no acute distress and is resting comfortably in bed.

## 2013-12-07 DIAGNOSIS — I4891 Unspecified atrial fibrillation: Secondary | ICD-10-CM

## 2013-12-07 DIAGNOSIS — D649 Anemia, unspecified: Secondary | ICD-10-CM

## 2013-12-07 LAB — CBC
HCT: 24.2 % — ABNORMAL LOW (ref 36.0–46.0)
MCH: 26.9 pg (ref 26.0–34.0)
MCHC: 29.8 g/dL — ABNORMAL LOW (ref 30.0–36.0)
MCV: 90.3 fL (ref 78.0–100.0)
Platelets: 162 10*3/uL (ref 150–400)
RBC: 2.68 MIL/uL — ABNORMAL LOW (ref 3.87–5.11)
RDW: 18.5 % — ABNORMAL HIGH (ref 11.5–15.5)
WBC: 5.7 10*3/uL (ref 4.0–10.5)

## 2013-12-07 LAB — CK TOTAL AND CKMB (NOT AT ARMC)
CK, MB: 1.4 ng/mL (ref 0.3–4.0)
Relative Index: INVALID (ref 0.0–2.5)
Total CK: 34 U/L (ref 7–177)

## 2013-12-07 LAB — BASIC METABOLIC PANEL
BUN: 62 mg/dL — ABNORMAL HIGH (ref 6–23)
CO2: 24 mEq/L (ref 19–32)
Calcium: 7.8 mg/dL — ABNORMAL LOW (ref 8.4–10.5)
Chloride: 107 mEq/L (ref 96–112)
Creatinine, Ser: 3.06 mg/dL — ABNORMAL HIGH (ref 0.50–1.10)
GFR calc non Af Amer: 15 mL/min — ABNORMAL LOW (ref >90)
Glucose, Bld: 87 mg/dL (ref 70–99)

## 2013-12-07 LAB — TROPONIN I: Troponin I: <0.3 ng/mL (ref <0.30)

## 2013-12-07 LAB — GLUCOSE, CAPILLARY
Glucose-Capillary: 96 mg/dL (ref 70–99)
Glucose-Capillary: 93 mg/dL (ref 70–99)
Glucose-Capillary: 131 mg/dL — ABNORMAL HIGH (ref 70–99)

## 2013-12-07 MED ORDER — ALBUTEROL SULFATE (2.5 MG/3ML) 0.083% IN NEBU
2.5000 mg | INHALATION_SOLUTION | RESPIRATORY_TRACT | Status: DC | PRN
  Filled 2013-12-07: qty 3

## 2013-12-07 MED ORDER — ALBUTEROL SULFATE (2.5 MG/3ML) 0.083% IN NEBU
2.5000 mg | INHALATION_SOLUTION | Freq: Three times a day (TID) | RESPIRATORY_TRACT | Status: DC
  Administered 2013-12-07: 2.5 mg via RESPIRATORY_TRACT

## 2013-12-07 MED ORDER — ALBUTEROL SULFATE (2.5 MG/3ML) 0.083% IN NEBU
2.5000 mg | INHALATION_SOLUTION | Freq: Three times a day (TID) | RESPIRATORY_TRACT | Status: DC
  Administered 2013-12-07 – 2013-12-10 (×9): 2.5 mg via RESPIRATORY_TRACT
  Filled 2013-12-07 (×21): qty 3

## 2013-12-07 MED ORDER — AMIODARONE HCL 100 MG PO TABS
100.0000 mg | ORAL_TABLET | Freq: Every day | ORAL | Status: DC
Start: 2013-12-07 — End: 2013-12-10
  Administered 2013-12-07 – 2013-12-09 (×3): 100 mg via ORAL
  Filled 2013-12-07 (×4): qty 1

## 2013-12-07 MED ORDER — FUROSEMIDE 10 MG/ML IJ SOLN
40.0000 mg | Freq: Two times a day (BID) | INTRAMUSCULAR | Status: DC
  Administered 2013-12-08 – 2013-12-10 (×5): 40 mg via INTRAVENOUS
  Filled 2013-12-07 (×7): qty 4

## 2013-12-07 MED ORDER — DILTIAZEM HCL 100 MG IV SOLR
5.0000 mg/h | INTRAVENOUS | Status: DC
  Administered 2013-12-07: 10 mg/h via INTRAVENOUS
  Filled 2013-12-07: qty 100

## 2013-12-07 MED ORDER — NITROGLYCERIN 0.4 MG SL SUBL
SUBLINGUAL_TABLET | SUBLINGUAL | Status: AC
  Filled 2013-12-07: qty 25

## 2013-12-07 NOTE — Progress Notes (Signed)
eLink Physician-Brief Progress Note Patient Name: [name] DOB: 1951-06-12 MRN: [id number]  Date of Service  12/07/2013   HPI/Events of Note   Asked by Dr Butler Denmark to eval for CVC placement. She is on dilt gtt for A Fib + RVR, diastolic CHF. She has single PIV, due to be changed. Unable to get another PIV.   eICU Interventions  Discussed case with Dr Craige Cotta. We both agree that as long as the IV and skin look good, the risk of a new CVC is higher than the risk of an infected PIV. We should watch and re-attempt PIV or PICC if needed. Would like to avoid CVC unless no other options for access.    Intervention Category Minor Interventions: Communication with other healthcare providers and/or family  [name] S. 12/07/2013, 8:54 PM

## 2013-12-07 NOTE — Significant Event (Signed)
Rapid Response Event Note  Overview: Time Called: 1003 Arrival Time: 1005 Event Type: Cardiac;Respiratory  Initial Focused Assessment: Patient complains of chest pain.  SOB, can only speak a couple words at a time.  Lung sounds decreased mid/lower lobes.  Heart tones irregular. Patient converted from SR to AF at 0930am, AF 110-130s BP 161/70  AF 122 RR 30  O2 sat 91-92% on 3L [place]  Interventions: SL NTG given MD notified, Cardizem 15mg  bolus and gtt ordered and initated Increased O2 to 5L  O2 sats 95 RN to call if assistance needed  Event Summary: Name of Physician Notified: Rizwan at 1005    at    Outcome: Stayed in room and stabalized  Event End Time: 1055  Marcellina Millin

## 2013-12-07 NOTE — Progress Notes (Addendum)
RN informed husband of plan to insertion of longer term IV, inserted deeper into the skin, performed by MD, RN answered all questions within scope of practice, nursing will cont to monitor

## 2013-12-07 NOTE — Clinical Social Work Psychosocial (Signed)
Clinical Social Work Department BRIEF PSYCHOSOCIAL ASSESSMENT 12/07/2013  Patient:  [name], [name]     Account Number:  [id number]     Admit date:  12/03/2013  Clinical Social Worker:  Lavell Luster  Date/Time:  12/07/2013 03:39 PM  Referred by:  Physician  Date Referred:  12/07/2013 Referred for  SNF Placement   Other Referral:   Interview type:  Patient Other interview type:   Patient alert and oriented at time of assessment.    PSYCHOSOCIAL DATA Living Status:  HUSBAND Admitted from facility:  [facility] Level of care:  Skilled Nursing Facility Primary support name:  [name] Primary support relationship to patient:  SPOUSE Degree of support available:   Support is good.    CURRENT CONCERNS Current Concerns  Post-Acute Placement   Other Concerns:    SOCIAL WORK ASSESSMENT / PLAN CSW met with patient at bedside to discuss whether or not she plans to return to [facility] upon DC. Patient states that she really wants to go home instead of returning to SNF. Husband stated that he cannot manage patient at home, and this upset the patient. Patient states that the husband has never done anything for her, so she feels he should not be making the statements he is making about not being able to care for her. CSW explained to patient that as long as she is oriented x4 and can understand the situation she is free to make her own decisions about where she goes after DC. CSW will work patient up for return to SNF, but patient seems to think she will return home after DC. Patient reports that she lives with her husband, and has two adult children in the area.   Assessment/plan status:  Psychosocial Support/Ongoing Assessment of Needs Other assessment/ plan:   Complete FL2, Fax   Information/referral to community resources:   CSW contact information given to patient.    PATIENT'S/FAMILY'S RESPONSE TO PLAN OF CARE: Patient would like to return home  upon DC, husband wants her to return to [facility]. Patient and husband were both pleasant, appropriate, and appreciative of CSW contact. CSW will continue to follow for DC needs.       [name], [place], [name], [phone]

## 2013-12-07 NOTE — Progress Notes (Signed)
Pt HR at 0933 converted to Afib 100-120's, MD/N, new orders received, at 1300 pt's husband at [facility], pt converted back to NSR at 1230, RN updated pt on change in pt's condition, pt's husband request to see MD, MD/N, nursing will cont to monitor

## 2013-12-07 NOTE — Progress Notes (Signed)
RT found patient not on bipap and resting on 5lpm nasal cannula. Patient is tolerating nasal cannula well at this time and patient's sat of 97%. Patient told RT she wanted to come off of bipap. RT will continue to monitor.

## 2013-12-07 NOTE — Progress Notes (Addendum)
[facility] Progress Note [name] TEAM 1 - Stepdown/ICU TEAM   LAWANDA HOLZHEIMER JYN:[id number] DOB: 1951/02/01 DOA: 12/03/2013 PCP: Willey Blade, MD  Brief narrative: 62 year old obese female with history of acute mild systolic and diastolic failure, type 2 diabetes mellitus, CKD stage IV, paroxysmal A. fib (not on anticoagulation due to GI bleed and leg hematoma), chronic anemia, obstructive sleep apnea/OHS noncompliant with CPAP, hypertension, and a recent hospitalization for respiratory failure associated with CHF who was brought to the ED with worsening shortness of breath over 24hrs.  EMS was called and as she was found to be tachypneic and tachycardic. She was placed on BiPAP and brought to the ED. An ABG done in the ED showed a pH of 7.2, PCO2 of 54 and PO2 of 167. Patient O2 sats were stable on BiPAP. Blood work showed leukocytosis with WBC of 14.7, hemoglobin of 10.2, platelets of 216. Sodium of 145, potassium 4.2, chloride 112, BUN of 46, creatinine of 2.1 and glucose of 211. BNP was 1900. Chest x-ray showed bilateral pulmonary opacity suggestive likely of pulmonary edema with possible bilateral pneumonia.   HPI/Subjective:  Had an episode of "indigestion" this morning (when she was noted to convert from sinus bradycardia to A-fib with RVR). This has since resolved. Overall, she is breathing better than she was when she was admitted. She admits to feeling anxious.   Assessment/Plan:  Acute hypoxic and hypercarbic respiratory failure /Acute diastolic CHF exacerbation   Improved w/ diuresis - now off BIPAP - NOTE: multiple admission for this issue- likely should be discharged with a higher dose of Lasix - EF 50-55% via TTE 10/15/2013 - at time of discharge 11/22/13 wgt was 118.4 kg - weight at presentation 131kg  - now 6 L negative balance- will titrate Lasix down to 40 BID due to rising creatinine - Per Dr Sharyn Lull, he will see her as an outpt - we have been able to titrate O2  down to 3 L today  A-fib with RVR on Chronic Afib- recently sinus brady  Not an anticoagulation due to GI bleed and leg hematoma -  - have had to initiate IV Cardizem - Pt subsequently converted back to sinus Bradycardia a few hours later - I have spoken with Dr Sharyn Lull (after patient converted back to [facility]) who has suggested we increase her Amiodarone to 200 mg in the AM and 100 mg in the PM - will d/c Cardizem infusion  OHS/SA  Has refused CPAP in the past - Dr Dolphus Jenny had a lengthy discussion with her concerning the absolute need for nightly CPAP use and the fact that failure to do so will unquestionably shorten her life and lead to more frequent episodes of respiratory distress- likely has significant component of pulmonary hypertension/cor pulmonale further exacerbating her volume overload - she has agreed to retry CPAP nightly with a mild anxiolytic to help   Possible B HCAP  Pt is afebrile w/ normal WBC - doubt this represents PNA - discontinued antibiotic therapy - follow clinically w/ diuresis   Multidrug resistant E coli UTI  UA was not convincing of UTI, but cx has revealed >100k colonies of E coli - given fosfomycin as pt is allergic to multp drugs and bacteria is resistant to many others   HTN  Follow with titration of diuretic   DM2 w/ renal complications  CBG well controlled at the present time   CKD (chronic kidney disease), stage IV Baseline creatinine appears to be approximately 2.7 - follow with  ongoing diuresis   Anemia - chronic  Hemoglobin was as low as 7.5 dating back to 11/19/2013 - follow trend - no evidence of blood loss   Morbid obesity - Body mass index is 50.78 kg/(m^2).   Wound of right leg  Calf hematoma Nov 2014 requiring surgical I&D w skin graft for closure - wound appears to be in great shape   Code Status: FULL  Family Communication: discussed in detail with husband Disposition Plan: SDU  Consultants:  None  Procedures:  None  Antibiotics:   Levaquin 12/26 >> 12/28  Vancomycin 12/26 >> 12/28  DVT prophylaxis:  SQ heparin   Objective: Blood pressure 126/87, pulse 117, temperature 98 F (36.7 C), temperature source Oral, resp. rate 22, height 5\' 3"  (1.6 m), weight 130.7 kg (288 lb 2.3 oz), SpO2 98.00%.  Intake/Output Summary (Last 24 hours) at 12/07/13 1151 Last data filed at 12/07/13 0711  Gross per 24 hour  Intake    480 ml  Output   1500 ml  Net  -1020 ml     Exam: General: No acute respiratory distress Lungs: distant breath sounds  Cardiovascular: Irregular rate and rhythm without murmur gallop or rub normal S1 and S2 Abdomen: Nontender, nondistended, soft, bowel sounds positive, no rebound, no ascites, no appreciable mass Extremities: No significant cyanosis, clubbing- mild b/l pedal edema  Data Reviewed: Basic Metabolic Panel:  Recent Labs Lab 12/03/13 1052 12/04/13 0445 12/05/13 0555 12/06/13 0400 12/07/13 0454  NA 145 143 142 139 144  K 4.2 4.8 4.8 4.7 4.8  CL 112 111 110 107 107  CO2  --  21 23 22 24   GLUCOSE 211* 89 85 72 87  BUN 46* 49* 53* 57* 62*  CREATININE 2.10* 2.41* 2.65* 2.83* 3.06*  CALCIUM  --  8.7 8.4 8.1* 7.8*   Liver Function Tests:  Recent Labs Lab 12/05/13 0555  AST 7  ALT 6  ALKPHOS 95  BILITOT 0.2*  PROT 6.9  ALBUMIN 2.5*   No results found for this basename: LIPASE, AMYLASE,  in the last 168 hours No results found for this basename: AMMONIA,  in the last 168 hours CBC:  Recent Labs Lab 12/03/13 1040 12/03/13 1052 12/04/13 0445 12/06/13 0559 12/07/13 0454  WBC 14.7*  --  8.0 6.1 5.7  NEUTROABS 13.5*  --  6.9  --   --   HGB 8.8* 10.2* 7.5* 7.2* 7.2*  HCT 29.7* 30.0* 26.0* 24.8* 24.2*  MCV 91.1  --  92.2 91.2 90.3  PLT 216  --  193 184 162   Cardiac Enzymes: No results found for this basename: CKTOTAL, CKMB, CKMBINDEX, TROPONINI,  in the last 168 hours BNP (last 3 results)  Recent Labs  11/17/13 1052 12/03/13 1329 12/07/13 0454  PROBNP 2548.0*  1911.0* 4262.0*   CBG:  Recent Labs Lab 12/06/13 1152 12/06/13 1629 12/06/13 2126 12/06/13 2150 12/07/13 0706  GLUCAP 111* 112* 113* 96 93    Recent Results (from the past 240 hour(s))  CULTURE, BLOOD (ROUTINE X 2)     Status: None   Collection Time    12/03/13  1:15 PM      Result Value Range Status   Specimen Description BLOOD ARM RIGHT   Final   Special Requests BOTTLES DRAWN AEROBIC AND ANAEROBIC 10CC   Final   Culture  Setup Time     Final   Value: 12/03/2013 16:58     Performed at [facility]   Culture     Final  Value:        BLOOD CULTURE RECEIVED NO GROWTH TO DATE CULTURE WILL BE HELD FOR 5 DAYS BEFORE ISSUING A FINAL NEGATIVE REPORT     Performed at [facility]   Report Status PENDING   Incomplete  CULTURE, BLOOD (ROUTINE X 2)     Status: None   Collection Time    12/03/13  1:30 PM      Result Value Range Status   Specimen Description BLOOD HAND RIGHT   Final   Special Requests BOTTLES DRAWN AEROBIC AND ANAEROBIC 10CC   Final   Culture  Setup Time     Final   Value: 12/03/2013 16:57     Performed at [facility]   Culture     Final   Value:        BLOOD CULTURE RECEIVED NO GROWTH TO DATE CULTURE WILL BE HELD FOR 5 DAYS BEFORE ISSUING A FINAL NEGATIVE REPORT     Performed at [facility]   Report Status PENDING   Incomplete  URINE CULTURE     Status: None   Collection Time    12/03/13  6:32 PM      Result Value Range Status   Specimen Description URINE, CATHETERIZED   Final   Special Requests NONE   Final   Culture  Setup Time     Final   Value: 12/04/2013 01:50     Performed at [facility] Count     Final   Value: >=100,000 COLONIES/ML     Performed at [facility]   Culture     Final   Value: ESCHERICHIA COLI     LACTOBACILLUS SPECIES     Note: Standardized susceptibility testing for this organism is not available.     Performed at [facility]   Report Status 12/06/2013 FINAL    Final   Organism ID, Bacteria ESCHERICHIA COLI   Final     Studies:  Recent x-ray studies have been reviewed in detail by the Attending Physician  Scheduled Meds:  Scheduled Meds: . albuterol  2.5 mg Nebulization TID  . allopurinol  100 mg Oral Daily  . ALPRAZolam  0.25 mg Oral QHS  . amiodarone  200 mg Oral Daily  . amLODipine  10 mg Oral Daily  . aspirin  325 mg Oral Daily  . atorvastatin  20 mg Oral QHS  . docusate sodium  100 mg Oral BID  . ferrous sulfate  325 mg Oral TID WC  . furosemide  80 mg Intravenous BID  . heparin  5,000 Units Subcutaneous Q8H  . hydrALAZINE  50 mg Oral TID  . insulin aspart  0-15 Units Subcutaneous TID WC  . insulin glargine  15 Units Subcutaneous BID  . isosorbide dinitrate  10 mg Oral BID  . metoCLOPramide  5 mg Oral TID  . pantoprazole  40 mg Oral Daily  . polyethylene glycol  17 g Oral BID  . potassium chloride  20 mEq Oral Daily  . traZODone  25 mg Oral QHS   Continuous Infusions: . diltiazem (CARDIZEM) infusion 10 mg/hr (12/07/13 1022)    Time spent on care of this patient: 35 min   Shalina Norfolk, MD  [facility] Office  [phone] Pager - Text Page per Loretha Stapler as per below:  On-Call/Text Page:      [email or web address]      password [email or web address]  If 7PM-7AM, please contact night-coverage [email or web address] Password [email or web address] 12/07/2013, 11:51 AM  LOS: 4 days

## 2013-12-08 LAB — BASIC METABOLIC PANEL
BUN: 56 mg/dL — ABNORMAL HIGH (ref 6–23)
CO2: 26 mEq/L (ref 19–32)
Calcium: 8.1 mg/dL — ABNORMAL LOW (ref 8.4–10.5)
Creatinine, Ser: 2.66 mg/dL — ABNORMAL HIGH (ref 0.50–1.10)
GFR calc Af Amer: 21 mL/min — ABNORMAL LOW (ref >90)
GFR calc non Af Amer: 18 mL/min — ABNORMAL LOW (ref >90)
Glucose, Bld: 79 mg/dL (ref 70–99)

## 2013-12-08 LAB — CBC
MCH: 26.7 pg (ref 26.0–34.0)
MCHC: 29.7 g/dL — ABNORMAL LOW (ref 30.0–36.0)
Platelets: 175 10*3/uL (ref 150–400)
RDW: 18.5 % — ABNORMAL HIGH (ref 11.5–15.5)

## 2013-12-08 LAB — GLUCOSE, CAPILLARY
Glucose-Capillary: 119 mg/dL — ABNORMAL HIGH (ref 70–99)
Glucose-Capillary: 119 mg/dL — ABNORMAL HIGH (ref 70–99)
Glucose-Capillary: 88 mg/dL (ref 70–99)
Glucose-Capillary: 89 mg/dL (ref 70–99)

## 2013-12-08 NOTE — Evaluation (Signed)
Physical Therapy Evaluation Patient Details Name: [name] MRN: [id number] DOB: [birth year] Today's Date: 12/08/2013 Time: 7829-5621 PT Time Calculation (min): 19 min  PT Assessment / Plan / Recommendation History of Present Illness  Pt with multiple medical conditions admitted for SOB. Was at SNF PTA.  Clinical Impression  Pt from [facility] care. Pt deconditioned with decreased OOB tolerance. Pt was amb short distances PTA but now unable to complete stand pvt xfer to chair. Pt to con't to benefit from SNF upon d/c to achieve maximal functional recovery to eventually return to home with spouse.    PT Assessment  Patient needs continued PT services    Follow Up Recommendations  SNF    Does the patient have the potential to tolerate intense rehabilitation      Barriers to Discharge        Equipment Recommendations       Recommendations for Other Services     Frequency Min 2X/week    Precautions / Restrictions Precautions Precautions: Fall Restrictions Weight Bearing Restrictions: No   Pertinent Vitals/Pain L knee pain with mvmt however did not rate.      Mobility  Bed Mobility Bed Mobility: Supine to Sit;Sit to Supine;Sitting - Scoot to Edge of Bed Supine to Sit: 1: +2 Total assist;HOB elevated Supine to Sit: Patient Percentage: 40% Sitting - Scoot to Edge of Bed: 2: Max assist Sit to Supine: 1: +2 Total assist;HOB flat Sit to Supine: Patient Percentage: 20% Details for Bed Mobility Assistance: assist for trunk elevation and L LE management Transfers Transfers: Sit to Stand;Stand to Sit Sit to Stand: 2: Max assist;With upper extremity assist;From bed Stand to Sit: 4: Min assist;With upper extremity assist;To bed Details for Transfer Assistance: pt with quick onset of fatigue and unable to complete std pvt to chair Ambulation/Gait Ambulation/Gait Assistance: Not tested (comment)    Exercises     PT Diagnosis: Difficulty walking;Generalized  weakness  PT Problem List: Decreased strength;Decreased activity tolerance;Decreased balance;Decreased mobility PT Treatment Interventions: DME instruction;Gait training;Functional mobility training;Therapeutic activities;Therapeutic exercise;Patient/family education     PT Goals(Current goals can be found in the care plan section) Acute Rehab PT Goals Patient Stated Goal: to get stronger so she can go home PT Goal Formulation: With patient Time For Goal Achievement: 12/15/13 Potential to Achieve Goals: Good  Visit Information  Last PT Received On: 12/08/13 Assistance Needed: +2 History of Present Illness: Pt with multiple medical conditions admitted for SOB. Was at SNF PTA.       Prior Functioning  Home Living Family/patient expects to be discharged to:: Skilled nursing facility Prior Function Level of Independence: Needs assistance Gait / Transfers Assistance Needed: uses RW.  Walking very short distances at [facility] before admission. ADL's / Homemaking Assistance Needed: SNF staff assists Communication Communication: No difficulties Dominant Hand: Right    Cognition  Cognition Arousal/Alertness: Awake/alert Behavior During Therapy: WFL for tasks assessed/performed Overall Cognitive Status: Within Functional Limits for tasks assessed    Extremity/Trunk Assessment Upper Extremity Assessment Upper Extremity Assessment: Generalized weakness Lower Extremity Assessment Lower Extremity Assessment: Generalized weakness (L chronic knee pain limited MMT)   Balance Balance Balance Assessed: Yes Static Sitting Balance Static Sitting - Balance Support: Feet supported;Right upper extremity supported Static Sitting - Level of Assistance: 5: Stand by assistance Static Sitting - Comment/# of Minutes: 5 min  End of Session PT - End of Session Equipment Utilized During Treatment: Oxygen;Gait belt Activity Tolerance: Patient limited by fatigue Patient left: in bed;with call  bell/phone within reach Nurse Communication: Mobility status  GP     Marcene Brawn 12/08/2013, 4:39 PM  Lewis Shock, PT, DPT Pager #: [phone] Office #: [phone]

## 2013-12-08 NOTE — Progress Notes (Signed)
[facility] Progress Note [place] TEAM 1 - Stepdown/ICU TEAM   [name] JYN:[id number] DOB: 1951-10-25 DOA: 12/03/2013 PCP: Willey Blade, MD  Brief narrative: 62 year old obese female with history of acute mild systolic and diastolic failure, type 2 diabetes mellitus, CKD stage IV, paroxysmal A. fib (not on anticoagulation due to GI bleed and leg hematoma), chronic anemia, obstructive sleep apnea/OHS noncompliant with CPAP, hypertension, and a recent hospitalization for respiratory failure associated with CHF who was brought to the ED with worsening shortness of breath over 24hrs.  EMS was called and as she was found to be tachypneic and tachycardic. She was placed on BiPAP and brought to the ED. An ABG done in the ED showed a pH of 7.2, PCO2 of 54 and PO2 of 167. Patient O2 sats were stable on BiPAP. Blood work showed leukocytosis with WBC of 14.7, hemoglobin of 10.2, platelets of 216. Sodium of 145, potassium 4.2, chloride 112, BUN of 46, creatinine of 2.1 and glucose of 211. BNP was 1900. Chest x-ray showed bilateral pulmonary opacity suggestive likely of pulmonary edema with possible bilateral pneumonia.   HPI/Subjective: The patient reports that she is feeling somewhat better today but still is not breathing as well "as I usually do."  She denies chest pain nausea vomiting fevers chills or abdominal pain.  Assessment/Plan:    Acute hypoxic and hypercarbic respiratory failure /Acute diastolic CHF exacerbation  -Improving w/ diuresis - now off BIPAP - using CPAP QHS as per prescribed home regimen  - NOTE: multiple admission for this issue - likely should be discharged with a higher dose of Lasix due to fluid indiscretion at home  - EF 50-55% via TTE 10/15/2013 - at time of discharge 11/22/13 wgt was 118.4 kg - weight at presentation 131kg  - thus far 6 L negative balance - titrated Lasix down to 40 BID due to rising creatinine - cont to follow Is/Os and wgts - Per Dr Sharyn Lull he  will see her as an outpt - we have been able to titrate O2 down to 3 L   A-fib with RVR on Chronic Afib- recently sinus brady  -not an anticoagulation due to GI bleed and leg hematoma  -had to initiate IV Cardizem short term - pt subsequently converted back to sinus Bradycardia a few hours later -Dr. Butler Denmark spoke with Dr Sharyn Lull who suggested we increase her Amiodarone to 200 mg in the AM and 100 mg in the PM -now off Cardizem infusion  OHS/SA  Has refused CPAP in the past - Dr Sharon Seller had a lengthy discussion with her concerning the absolute need for nightly CPAP use and the fact that failure to do so will unquestionably shorten her life and lead to more frequent episodes of respiratory distress - likely has significant component of pulmonary hypertension/cor pulmonale further exacerbating her volume overload - she has agreed to retry CPAP nightly with a mild anxiolytic to help   Possible B HCAP  Pt is afebrile w/ normal WBC - doubt this represents PNA - discontinued antibiotic therapy - follow clinically w/ diuresis   Multidrug resistant E coli UTI  UA was not convincing of UTI, but cx has revealed >100k colonies of E coli - given fosfomycin as pt is allergic to multp drugs and bacteria is resistant to many others   HTN  Follow with titration of diuretic - currently reasonably controlled   DM2 w/ renal complications  CBG well controlled at the present time   CKD (chronic kidney disease),  stage IV Baseline creatinine appears to be approximately 2.7 - follow with ongoing diuresis   Anemia - chronic  Hemoglobin was as low as 7.5 dating back to 11/19/2013 - follow trend - no evidence of blood loss - transfuse if drops below 7.0  Morbid obesity - Body mass index is 50.78 kg/(m^2).   Wound of right leg  Calf hematoma Nov 2014 requiring surgical I&D w skin graft for closure - wound appears to be in great shape   Code Status: FULL  Family Communication: no family present at time of exam  today  Disposition Plan: stable for transfer to tele bed - follow rate control - begin PT/OT - pt wants to d/c home but suspect SNF will be required - cont to diurese  Consultants:  None   Procedures:  None   Antibiotics:  Levaquin 12/26 >> 12/28  Vancomycin 12/26 >> 12/28   DVT prophylaxis:  SQ heparin   Objective: Blood pressure 136/49, pulse 51, temperature 98.6 F (37 C), temperature source Oral, resp. rate 18, height 5\' 3"  (1.6 m), weight 125.9 kg (277 lb 9 oz), SpO2 96.00%.  Intake/Output Summary (Last 24 hours) at 12/08/13 1056 Last data filed at 12/08/13 0949  Gross per 24 hour  Intake    735 ml  Output   2250 ml  Net  -1515 ml   Exam: General: No acute respiratory distress at rest  Lungs: distant breath sounds - no active wheeze - bibasilar crackles  Cardiovascular: RRR at ~60bpm - no appreciable M - hs are quite distant  Abdomen: obese, nontender, nondistended, soft, bowel sounds positive, no rebound, no ascites, no appreciable mass Extremities: No significant cyanosis, clubbing - mild b/l pedal edema - chronic venous stasis change B LE   Data Reviewed: Basic Metabolic Panel:  Recent Labs Lab 12/04/13 0445 12/05/13 0555 12/06/13 0400 12/07/13 0454 12/08/13 0425  NA 143 142 139 144 146  K 4.8 4.8 4.7 4.8 4.9  CL 111 110 107 107 109  CO2 21 23 22 24 26   GLUCOSE 89 85 72 87 79  BUN 49* 53* 57* 62* 56*  CREATININE 2.41* 2.65* 2.83* 3.06* 2.66*  CALCIUM 8.7 8.4 8.1* 7.8* 8.1*   Liver Function Tests:  Recent Labs Lab 12/05/13 0555  AST 7  ALT 6  ALKPHOS 95  BILITOT 0.2*  PROT 6.9  ALBUMIN 2.5*   CBC:  Recent Labs Lab 12/03/13 1040 12/03/13 1052 12/04/13 0445 12/06/13 0559 12/07/13 0454 12/08/13 0425  WBC 14.7*  --  8.0 6.1 5.7 4.7  NEUTROABS 13.5*  --  6.9  --   --   --   HGB 8.8* 10.2* 7.5* 7.2* 7.2* 7.1*  HCT 29.7* 30.0* 26.0* 24.8* 24.2* 23.9*  MCV 91.1  --  92.2 91.2 90.3 89.8  PLT 216  --  193 184 162 175   Cardiac  Enzymes:  Recent Labs Lab 12/07/13 1008  CKTOTAL 34  CKMB 1.4  TROPONINI <0.30   BNP (last 3 results)  Recent Labs  11/17/13 1052 12/03/13 1329 12/07/13 0454  PROBNP 2548.0* 1911.0* 4262.0*   CBG:  Recent Labs Lab 12/07/13 1648 12/07/13 2205 12/07/13 2223 12/08/13 0239 12/08/13 0826  GLUCAP 111* 119* 119* 120* 88    Recent Results (from the past 240 hour(s))  CULTURE, BLOOD (ROUTINE X 2)     Status: None   Collection Time    12/03/13  1:15 PM      Result Value Range Status   Specimen Description BLOOD ARM  RIGHT   Final   Special Requests BOTTLES DRAWN AEROBIC AND ANAEROBIC 10CC   Final   Culture  Setup Time     Final   Value: 12/03/2013 16:58     Performed at [facility]   Culture     Final   Value:        BLOOD CULTURE RECEIVED NO GROWTH TO DATE CULTURE WILL BE HELD FOR 5 DAYS BEFORE ISSUING A FINAL NEGATIVE REPORT     Performed at [facility]   Report Status PENDING   Incomplete  CULTURE, BLOOD (ROUTINE X 2)     Status: None   Collection Time    12/03/13  1:30 PM      Result Value Range Status   Specimen Description BLOOD HAND RIGHT   Final   Special Requests BOTTLES DRAWN AEROBIC AND ANAEROBIC 10CC   Final   Culture  Setup Time     Final   Value: 12/03/2013 16:57     Performed at [facility]   Culture     Final   Value:        BLOOD CULTURE RECEIVED NO GROWTH TO DATE CULTURE WILL BE HELD FOR 5 DAYS BEFORE ISSUING A FINAL NEGATIVE REPORT     Performed at [facility]   Report Status PENDING   Incomplete  URINE CULTURE     Status: None   Collection Time    12/03/13  6:32 PM      Result Value Range Status   Specimen Description URINE, CATHETERIZED   Final   Special Requests NONE   Final   Culture  Setup Time     Final   Value: 12/04/2013 01:50     Performed at [facility] Count     Final   Value: >=100,000 COLONIES/ML     Performed at [facility]   Culture     Final   Value: ESCHERICHIA  COLI     LACTOBACILLUS SPECIES     Note: Standardized susceptibility testing for this organism is not available.     Performed at [facility]   Report Status 12/06/2013 FINAL   Final   Organism ID, Bacteria ESCHERICHIA COLI   Final     Studies:  Recent x-ray studies have been reviewed in detail by the Attending Physician  Scheduled Meds:  Scheduled Meds: . albuterol  2.5 mg Nebulization TID  . allopurinol  100 mg Oral Daily  . ALPRAZolam  0.25 mg Oral QHS  . amiodarone  100 mg Oral QHS  . amiodarone  200 mg Oral Daily  . amLODipine  10 mg Oral Daily  . aspirin  325 mg Oral Daily  . atorvastatin  20 mg Oral QHS  . docusate sodium  100 mg Oral BID  . ferrous sulfate  325 mg Oral TID WC  . furosemide  40 mg Intravenous BID  . heparin  5,000 Units Subcutaneous Q8H  . hydrALAZINE  50 mg Oral TID  . insulin aspart  0-15 Units Subcutaneous TID WC  . insulin glargine  15 Units Subcutaneous BID  . isosorbide dinitrate  10 mg Oral BID  . metoCLOPramide  5 mg Oral TID  . pantoprazole  40 mg Oral Daily  . polyethylene glycol  17 g Oral BID  . potassium chloride  20 mEq Oral Daily  . traZODone  25 mg Oral QHS    Time spent on care of this patient: 35 min  Lonia Blood, MD  [facility] Office  [phone] Pager - Text Page per Amion as per below:  On-Call/Text Page:      [email or web address]      password [email or web address]  If 7PM-7AM, please contact night-coverage [email or web address] Password [email or web address] 12/08/2013, 10:56 AM   LOS: 5 days

## 2013-12-08 NOTE — Clinical Social Work Placement (Signed)
Clinical Social Work Department CLINICAL SOCIAL WORK PLACEMENT NOTE 12/08/2013  Patient:  [name], [name]  Account Number:  [id number] Admit date:  12/03/2013  Clinical Social Worker:  Cherre Blanc, [place]  Date/time:  12/08/2013 01:00 PM  Clinical Social Work is seeking post-discharge placement for this patient at the following level of care:   SKILLED NURSING   (*CSW will update this form in Epic as items are completed)   12/07/2013  Patient/family provided with [facility] Department of Clinical Social Work's list of facilities offering this level of care within the geographic area requested by the patient (or if unable, by the patient's family).  12/07/2013  Patient/family informed of their freedom to choose among providers that offer the needed level of care, that participate in Medicare, Medicaid or managed care program needed by the patient, have an available bed and are willing to accept the patient.  12/07/2013  Patient/family informed of [facility]' ownership interest in [facility], as well as of the fact that they are under no obligation to receive care at this facility.  PASARR submitted to EDS on  PASARR number received from EDS on   FL2 transmitted to all facilities in geographic area requested by pt/family on  12/07/2013 FL2 transmitted to all facilities within larger geographic area on   Patient informed that his/her managed care company has contracts with or will negotiate with  certain facilities, including the following:     Patient/family informed of bed offers received:   Patient chooses bed at  Physician recommends and patient chooses bed at    Patient to be transferred to  on   Patient to be transferred to facility by   The following physician request were entered in Epic:   Additional Comments:   [name], [place], [place], [phone]

## 2013-12-08 NOTE — Care Management (Signed)
Pt placed on cpap for the night @ 75mlO2 and a 3lt o2 bleed in pt tol well at this time will monitor

## 2013-12-09 DIAGNOSIS — D638 Anemia in other chronic diseases classified elsewhere: Secondary | ICD-10-CM

## 2013-12-09 DIAGNOSIS — N184 Chronic kidney disease, stage 4 (severe): Secondary | ICD-10-CM

## 2013-12-09 DIAGNOSIS — I509 Heart failure, unspecified: Secondary | ICD-10-CM

## 2013-12-09 LAB — CULTURE, BLOOD (ROUTINE X 2)
Culture: NO GROWTH
Culture: NO GROWTH

## 2013-12-09 LAB — BASIC METABOLIC PANEL
BUN: 51 mg/dL — ABNORMAL HIGH (ref 6–23)
CO2: 28 mEq/L (ref 19–32)
Calcium: 8.4 mg/dL (ref 8.4–10.5)
Chloride: 110 mEq/L (ref 96–112)
Creatinine, Ser: 2.3 mg/dL — ABNORMAL HIGH (ref 0.50–1.10)
GFR calc Af Amer: 25 mL/min — ABNORMAL LOW (ref >90)
GFR calc non Af Amer: 21 mL/min — ABNORMAL LOW (ref >90)
Glucose, Bld: 56 mg/dL — ABNORMAL LOW (ref 70–99)
Potassium: 4.7 mEq/L (ref 3.7–5.3)
Sodium: 148 mEq/L — ABNORMAL HIGH (ref 137–147)

## 2013-12-09 LAB — GLUCOSE, CAPILLARY
GLUCOSE-CAPILLARY: 79 mg/dL (ref 70–99)
GLUCOSE-CAPILLARY: 177 mg/dL — AB (ref 70–99)
Glucose-Capillary: 89 mg/dL (ref 70–99)
Glucose-Capillary: 186 mg/dL — ABNORMAL HIGH (ref 70–99)
Glucose-Capillary: 84 mg/dL (ref 70–99)

## 2013-12-09 NOTE — Progress Notes (Addendum)
[facility] PROGRESS NOTE  [name] EXB:[id number] DOB: 09/25/1951 DOA: 12/03/2013 PCP: Kevan Ny, MD  Off service summary: 63 year old obese female with history of acute mild systolic and diastolic failure, type 2 diabetes mellitus, CKD stage IV, paroxysmal A. fib (not on anticoagulation due to GI bleed and leg hematoma), chronic anemia, obstructive sleep apnea/OHS noncompliant with CPAP, hypertension, and a recent hospitalization for respiratory failure associated with CHF who was brought to the ED with worsening shortness of breath over 24hrs.  EMS was called and as she was found to be tachypneic and tachycardic. She was placed on BiPAP and brought to the ED. An ABG done in the ED showed a pH of 7.2, PCO2 of 54 and PO2 of 167. Patient O2 sats were stable on BiPAP. Blood work showed leukocytosis with WBC of 14.7, hemoglobin of 10.2, platelets of 216. Sodium of 145, potassium 4.2, chloride 112, BUN of 46, creatinine of 2.1 and glucose of 211. BNP was 1900. Chest x-ray showed bilateral pulmonary opacity suggestive likely of pulmonary edema with possible bilateral pneumonia.    Assessment/Plan: Acute hypoxic and hypercarbic respiratory failure /Acute diastolic CHF exacerbation  -Improving w/ diuresis - now off BIPAP - using CPAP QHS as per prescribed home regimen  - NOTE: multiple admission for this issue - likely should be discharged with a higher dose of Lasix due to fluid indiscretion at home  - EF 50-55% via TTE 10/15/2013 - at time of discharge 11/22/13 wgt was 118.4 kg - weight at presentation 131kg  - thus far 8.8 L negative balance - titrated Lasix down to 40 BID due to rising creatinine - cont to follow Is/Os and wgts  - Per Dr Terrence Dupont he will see her as an outpt  - Remains on 3 L [place] A-fib with RVR on Chronic Afib- recently sinus brady  -not an anticoagulation due to GI bleed and leg hematoma  -had to initiate IV Cardizem short term - pt subsequently converted back to sinus  Bradycardia a few hours later  -Dr. Wynelle Cleveland spoke with Dr Terrence Dupont who suggested we increase her Amiodarone to 200 mg in the AM and 100 mg in the PM  -Remains rate controlled OHS/SA  -Previously noncompliant with CPAP -CPAP while inpatient Possible B HCAP  -Pt is afebrile w/ normal WBC - doubt this represents PNA - discontinued antibiotic therapy - follow clinically w/ diuresis  Multidrug resistant E coli UTI  -UA was not convincing of UTI, but cx has revealed >100k colonies of E coli  -Given fosfomycin on 12/06/13 as pt is allergic to multp drugs and bacteria is resistant to many others  HTN  Follow with titration of diuretic - currently reasonably controlled  DM2 w/ renal complications  CBG well controlled at the present time  CKD (chronic kidney disease), stage IV  Baseline creatinine appears to be approximately 2.7 -Remains at baseline  Anemia - chronic  Hemoglobin was as low as 7.5 dating back to 11/19/2013 - follow trend - no evidence of blood loss - transfuse if drops below 7.0  Morbid obesity - Body mass index is 50.78 kg/(m^2).  Wound of right leg  Calf hematoma Nov 2014 requiring surgical I&D w skin graft for closure - wound appears to be in great shape  Code Status: Full Family Communication: Pt in room (indicate person spoken with, relationship, and if by phone, the number) Disposition Plan: Pending SNF   Consultants:  Cardiology  Procedures:  none  Antibiotics: Levaquin 12/26 >> 12/28  Vancomycin  12/26 >> 12/28  Fosfomycin 12/06/13  HPI/Subjective: No acute events noted.  Objective: Filed Vitals:   12/08/13 2155 12/09/13 0500 12/09/13 0518 12/09/13 0923  BP:   155/56   Pulse:   57 68  Temp:   97 F (36.1 C)   TempSrc:   Oral   Resp:   25 20  Height:      Weight:  126.4 kg (278 lb 10.6 oz)    SpO2: 93%  97%     Intake/Output Summary (Last 24 hours) at 12/09/13 1339 Last data filed at 12/09/13 1226  Gross per 24 hour  Intake    240 ml  Output    2501 ml  Net  -2261 ml   Filed Weights   12/07/13 0335 12/08/13 0604 12/09/13 0500  Weight: 130.7 kg (288 lb 2.3 oz) 125.9 kg (277 lb 9 oz) 126.4 kg (278 lb 10.6 oz)    Exam:   General:  Awake, in nad  Cardiovascular: regular, s1, s2  Respiratory: normal resp effort, no wheezing  Abdomen: soft, nondistended  Musculoskeletal: perfused, no clubbing   Data Reviewed: Basic Metabolic Panel:  Recent Labs Lab 12/05/13 0555 12/06/13 0400 12/07/13 0454 12/08/13 0425 12/09/13 0508  NA 142 139 144 146 148*  K 4.8 4.7 4.8 4.9 4.7  CL 110 107 107 109 110  CO2 23 22 24 26 28   GLUCOSE 85 72 87 79 56*  BUN 53* 57* 62* 56* 51*  CREATININE 2.65* 2.83* 3.06* 2.66* 2.30*  CALCIUM 8.4 8.1* 7.8* 8.1* 8.4   Liver Function Tests:  Recent Labs Lab 12/05/13 0555  AST 7  ALT 6  ALKPHOS 95  BILITOT 0.2*  PROT 6.9  ALBUMIN 2.5*   No results found for this basename: LIPASE, AMYLASE,  in the last 168 hours No results found for this basename: AMMONIA,  in the last 168 hours CBC:  Recent Labs Lab 12/03/13 1040 12/03/13 1052 12/04/13 0445 12/06/13 0559 12/07/13 0454 12/08/13 0425  WBC 14.7*  --  8.0 6.1 5.7 4.7  NEUTROABS 13.5*  --  6.9  --   --   --   HGB 8.8* 10.2* 7.5* 7.2* 7.2* 7.1*  HCT 29.7* 30.0* 26.0* 24.8* 24.2* 23.9*  MCV 91.1  --  92.2 91.2 90.3 89.8  PLT 216  --  193 184 162 175   Cardiac Enzymes:  Recent Labs Lab 12/07/13 1008  CKTOTAL 34  CKMB 1.4  TROPONINI <0.30   BNP (last 3 results)  Recent Labs  11/17/13 1052 12/03/13 1329 12/07/13 0454  PROBNP 2548.0* 1911.0* 4262.0*   CBG:  Recent Labs Lab 12/08/13 1133 12/08/13 1614 12/08/13 2222 12/09/13 0633 12/09/13 1144  GLUCAP 89 100* 89 79 186*    Recent Results (from the past 240 hour(s))  CULTURE, BLOOD (ROUTINE X 2)     Status: None   Collection Time    12/03/13  1:15 PM      Result Value Range Status   Specimen Description BLOOD ARM RIGHT   Final   Special Requests BOTTLES DRAWN  AEROBIC AND ANAEROBIC 10CC   Final   Culture  Setup Time     Final   Value: 12/03/2013 16:58     Performed at [facility]   Culture     Final   Value:        BLOOD CULTURE RECEIVED NO GROWTH TO DATE CULTURE WILL BE HELD FOR 5 DAYS BEFORE ISSUING A FINAL NEGATIVE REPORT     Performed at  [facility]   Report Status PENDING   Incomplete  CULTURE, BLOOD (ROUTINE X 2)     Status: None   Collection Time    12/03/13  1:30 PM      Result Value Range Status   Specimen Description BLOOD HAND RIGHT   Final   Special Requests BOTTLES DRAWN AEROBIC AND ANAEROBIC 10CC   Final   Culture  Setup Time     Final   Value: 12/03/2013 16:57     Performed at [facility]   Culture     Final   Value:        BLOOD CULTURE RECEIVED NO GROWTH TO DATE CULTURE WILL BE HELD FOR 5 DAYS BEFORE ISSUING A FINAL NEGATIVE REPORT     Performed at [facility]   Report Status PENDING   Incomplete  URINE CULTURE     Status: None   Collection Time    12/03/13  6:32 PM      Result Value Range Status   Specimen Description URINE, CATHETERIZED   Final   Special Requests NONE   Final   Culture  Setup Time     Final   Value: 12/04/2013 01:50     Performed at [facility]     Final   Value: >=100,000 COLONIES/ML     Performed at [facility]   Culture     Final   Value: ESCHERICHIA COLI     LACTOBACILLUS SPECIES     Note: Standardized susceptibility testing for this organism is not available.     Performed at [facility]   Report Status 12/06/2013 FINAL   Final   Organism ID, Bacteria ESCHERICHIA COLI   Final     Studies: No results found.  Scheduled Meds: . albuterol  2.5 mg Nebulization TID  . allopurinol  100 mg Oral Daily  . ALPRAZolam  0.25 mg Oral QHS  . amiodarone  100 mg Oral QHS  . amiodarone  200 mg Oral Daily  . amLODipine  10 mg Oral Daily  . aspirin  325 mg Oral Daily  . atorvastatin  20 mg Oral QHS  . docusate sodium  100  mg Oral BID  . ferrous sulfate  325 mg Oral TID WC  . furosemide  40 mg Intravenous BID  . heparin  5,000 Units Subcutaneous Q8H  . hydrALAZINE  50 mg Oral TID  . insulin aspart  0-15 Units Subcutaneous TID WC  . insulin glargine  15 Units Subcutaneous BID  . isosorbide dinitrate  10 mg Oral BID  . metoCLOPramide  5 mg Oral TID  . pantoprazole  40 mg Oral Daily  . polyethylene glycol  17 g Oral BID  . potassium chloride  20 mEq Oral Daily  . traZODone  25 mg Oral QHS   Continuous Infusions:   Principal Problem:   Acute respiratory failure Active Problems:   OBSTRUCTIVE SLEEP APNEA   HYPERTENSION   Acute combined systolic and diastolic heart failure   DM (diabetes mellitus), type 2 with renal complications   CKD (chronic kidney disease), stage IV   Obesity   A-fib   Acute respiratory failure with hypercapnia   HCAP (healthcare-associated pneumonia)  Time spent: 88min  Joycelin Radloff, [place] [facility] Pager [phone]. If 7PM-7AM, please contact night-coverage at [email or web address], password [facility] 12/09/2013, 1:39 PM  LOS: 6 days

## 2013-12-10 ENCOUNTER — Encounter (HOSPITAL_COMMUNITY): Payer: Self-pay | Admitting: Pulmonary Disease

## 2013-12-10 ENCOUNTER — Inpatient Hospital Stay (HOSPITAL_COMMUNITY): Payer: Medicare HMO

## 2013-12-10 DIAGNOSIS — E870 Hyperosmolality and hypernatremia: Secondary | ICD-10-CM | POA: Diagnosis not present

## 2013-12-10 DIAGNOSIS — J189 Pneumonia, unspecified organism: Secondary | ICD-10-CM | POA: Diagnosis not present

## 2013-12-10 DIAGNOSIS — I5043 Acute on chronic combined systolic (congestive) and diastolic (congestive) heart failure: Secondary | ICD-10-CM | POA: Diagnosis not present

## 2013-12-10 DIAGNOSIS — J9600 Acute respiratory failure, unspecified whether with hypoxia or hypercapnia: Secondary | ICD-10-CM | POA: Diagnosis not present

## 2013-12-10 LAB — COMPREHENSIVE METABOLIC PANEL
ALBUMIN: 2.9 g/dL — AB (ref 3.5–5.2)
ALT: 6 U/L (ref 0–35)
AST: 12 U/L (ref 0–37)
Alkaline Phosphatase: 99 U/L (ref 39–117)
BUN: 44 mg/dL — AB (ref 6–23)
CHLORIDE: 104 meq/L (ref 96–112)
CO2: 25 meq/L (ref 19–32)
Calcium: 8.8 mg/dL (ref 8.4–10.5)
Creatinine, Ser: 2.08 mg/dL — ABNORMAL HIGH (ref 0.50–1.10)
GFR calc non Af Amer: 24 mL/min — ABNORMAL LOW (ref >90)
GFR, EST AFRICAN AMERICAN: 28 mL/min — AB (ref >90)
GLUCOSE: 180 mg/dL — AB (ref 70–99)
POTASSIUM: 4.9 meq/L (ref 3.7–5.3)
Sodium: 145 mEq/L (ref 137–147)
Total Protein: 7.7 g/dL (ref 6.0–8.3)

## 2013-12-10 LAB — TROPONIN I
Troponin I: <0.3 ng/mL (ref <0.30)
Troponin I: <0.3 ng/mL (ref <0.30)
Troponin I: <0.3 ng/mL (ref <0.30)

## 2013-12-10 LAB — GLUCOSE, CAPILLARY
GLUCOSE-CAPILLARY: 95 mg/dL (ref 70–99)
GLUCOSE-CAPILLARY: 135 mg/dL — AB (ref 70–99)
Glucose-Capillary: 71 mg/dL (ref 70–99)
Glucose-Capillary: 121 mg/dL — ABNORMAL HIGH (ref 70–99)
Glucose-Capillary: 145 mg/dL — ABNORMAL HIGH (ref 70–99)

## 2013-12-10 LAB — POCT I-STAT 3, ART BLOOD GAS (G3+)
ACID-BASE EXCESS: 4 mmol/L — AB (ref 0.0–2.0)
Bicarbonate: 29.5 mEq/L — ABNORMAL HIGH (ref 20.0–24.0)
O2 Saturation: 97 %
PCO2 ART: 47.2 mmHg — AB (ref 35.0–45.0)
Patient temperature: 97.1
TCO2: 31 mmol/L (ref 0–100)
pH, Arterial: 7.4 (ref 7.350–7.450)
pO2, Arterial: 85 mmHg (ref 80.0–100.0)

## 2013-12-10 LAB — BASIC METABOLIC PANEL
BUN: 46 mg/dL — AB (ref 6–23)
CO2: 28 mEq/L (ref 19–32)
CREATININE: 2.06 mg/dL — AB (ref 0.50–1.10)
Calcium: 8.8 mg/dL (ref 8.4–10.5)
Chloride: 106 mEq/L (ref 96–112)
GFR calc Af Amer: 28 mL/min — ABNORMAL LOW (ref >90)
GFR, EST NON AFRICAN AMERICAN: 24 mL/min — AB (ref >90)
GLUCOSE: 57 mg/dL — AB (ref 70–99)
Potassium: 4.7 mEq/L (ref 3.7–5.3)
SODIUM: 145 meq/L (ref 137–147)

## 2013-12-10 LAB — BLOOD GAS, ARTERIAL
ACID-BASE EXCESS: 1.5 mmol/L (ref 0.0–2.0)
BICARBONATE: 28.5 meq/L — AB (ref 20.0–24.0)
Drawn by: 277551
FIO2: 1 %
O2 SAT: 89.2 %
PO2 ART: 70.5 mmHg — AB (ref 80.0–100.0)
Patient temperature: 98.6
TCO2: 30.7 mmol/L (ref 0–100)
pCO2 arterial: 71.4 mmHg (ref 35.0–45.0)
pH, Arterial: 7.225 — ABNORMAL LOW (ref 7.350–7.450)

## 2013-12-10 LAB — CBC
HCT: 29.6 % — ABNORMAL LOW (ref 36.0–46.0)
Hemoglobin: 8.9 g/dL — ABNORMAL LOW (ref 12.0–15.0)
MCH: 26.9 pg (ref 26.0–34.0)
MCHC: 30.1 g/dL (ref 30.0–36.0)
MCV: 89.4 fL (ref 78.0–100.0)
Platelets: 201 10*3/uL (ref 150–400)
RBC: 3.31 MIL/uL — ABNORMAL LOW (ref 3.87–5.11)
RDW: 18.8 % — ABNORMAL HIGH (ref 11.5–15.5)
WBC: 8.8 10*3/uL (ref 4.0–10.5)

## 2013-12-10 LAB — PROCALCITONIN: Procalcitonin: 0.56 ng/mL

## 2013-12-10 LAB — PRO B NATRIURETIC PEPTIDE: Pro B Natriuretic peptide (BNP): 2368 pg/mL — ABNORMAL HIGH (ref 0–125)

## 2013-12-10 MED ORDER — VANCOMYCIN HCL 10 G IV SOLR
2500.0000 mg | Freq: Once | INTRAVENOUS | Status: AC
  Administered 2013-12-10: 2500 mg via INTRAVENOUS
  Filled 2013-12-10: qty 2500

## 2013-12-10 MED ORDER — AMIODARONE HCL 200 MG PO TABS
200.0000 mg | ORAL_TABLET | Freq: Two times a day (BID) | ORAL | Status: DC
  Administered 2013-12-10 – 2013-12-16 (×12): 200 mg via ORAL
  Filled 2013-12-10 (×13): qty 1

## 2013-12-10 MED ORDER — AMIODARONE HCL IN DEXTROSE 360-4.14 MG/200ML-% IV SOLN
60.0000 mg/h | INTRAVENOUS | Status: DC
  Administered 2013-12-10: 60 mg/h via INTRAVENOUS

## 2013-12-10 MED ORDER — AMIODARONE LOAD VIA INFUSION
150.0000 mg | Freq: Once | INTRAVENOUS | Status: AC
  Administered 2013-12-10: 150 mg via INTRAVENOUS
  Filled 2013-12-10: qty 83.34

## 2013-12-10 MED ORDER — AMIODARONE HCL IN DEXTROSE 360-4.14 MG/200ML-% IV SOLN
INTRAVENOUS | Status: AC
  Administered 2013-12-10: 16:00:00 60 mg/h via INTRAVENOUS
  Filled 2013-12-10: qty 200

## 2013-12-10 MED ORDER — LEVALBUTEROL HCL 0.63 MG/3ML IN NEBU
0.6300 mg | INHALATION_SOLUTION | Freq: Three times a day (TID) | RESPIRATORY_TRACT | Status: DC
  Administered 2013-12-10 (×2): 0.63 mg via RESPIRATORY_TRACT
  Filled 2013-12-10 (×2): qty 3

## 2013-12-10 MED ORDER — BIOTENE DRY MOUTH MT LIQD
15.0000 mL | Freq: Two times a day (BID) | OROMUCOSAL | Status: DC
  Administered 2013-12-10 – 2013-12-14 (×9): 15 mL via OROMUCOSAL

## 2013-12-10 MED ORDER — AMIODARONE HCL IN DEXTROSE 360-4.14 MG/200ML-% IV SOLN: 30.0000 mg/h | INTRAVENOUS | Status: DC

## 2013-12-10 MED ORDER — METRONIDAZOLE IN NACL 5-0.79 MG/ML-% IV SOLN
500.0000 mg | Freq: Three times a day (TID) | INTRAVENOUS | Status: DC
  Administered 2013-12-10 – 2013-12-11 (×3): 500 mg via INTRAVENOUS
  Filled 2013-12-10 (×5): qty 100

## 2013-12-10 MED ORDER — LEVALBUTEROL HCL 0.63 MG/3ML IN NEBU: 0.6300 mg | INHALATION_SOLUTION | Freq: Three times a day (TID) | RESPIRATORY_TRACT | Status: DC

## 2013-12-10 MED ORDER — AMIODARONE HCL 200 MG PO TABS
200.0000 mg | ORAL_TABLET | Freq: Two times a day (BID) | ORAL | Status: DC
  Administered 2013-12-10: 200 mg via ORAL
  Filled 2013-12-10 (×2): qty 1

## 2013-12-10 MED ORDER — DILTIAZEM LOAD VIA INFUSION
10.0000 mg | Freq: Once | INTRAVENOUS | Status: DC
  Filled 2013-12-10: qty 10

## 2013-12-10 MED ORDER — AZTREONAM 2 G IJ SOLR
2.0000 g | Freq: Three times a day (TID) | INTRAMUSCULAR | Status: DC
  Filled 2013-12-10 (×3): qty 2

## 2013-12-10 MED ORDER — FUROSEMIDE 10 MG/ML IJ SOLN
60.0000 mg | Freq: Two times a day (BID) | INTRAMUSCULAR | Status: DC
  Administered 2013-12-10 – 2013-12-12 (×4): 60 mg via INTRAVENOUS
  Filled 2013-12-10 (×4): qty 6

## 2013-12-10 MED ORDER — LEVALBUTEROL HCL 0.63 MG/3ML IN NEBU
0.6300 mg | INHALATION_SOLUTION | RESPIRATORY_TRACT | Status: DC | PRN
  Filled 2013-12-10: qty 3

## 2013-12-10 MED ORDER — VANCOMYCIN HCL 10 G IV SOLR
1750.0000 mg | INTRAVENOUS | Status: DC
  Filled 2013-12-10: qty 1750

## 2013-12-10 MED ORDER — FUROSEMIDE 10 MG/ML IJ SOLN
20.0000 mg | Freq: Once | INTRAMUSCULAR | Status: AC
  Administered 2013-12-10: 20 mg via INTRAVENOUS

## 2013-12-10 MED ORDER — DEXTROSE 5 % IV SOLN
2.0000 g | Freq: Three times a day (TID) | INTRAVENOUS | Status: DC
  Administered 2013-12-10 – 2013-12-11 (×3): 2 g via INTRAVENOUS
  Filled 2013-12-10 (×5): qty 2

## 2013-12-10 MED ORDER — METHYLPREDNISOLONE SODIUM SUCC 40 MG IJ SOLR
40.0000 mg | Freq: Two times a day (BID) | INTRAMUSCULAR | Status: DC
  Administered 2013-12-10 (×2): 40 mg via INTRAVENOUS
  Filled 2013-12-10 (×4): qty 1

## 2013-12-10 MED ORDER — CHLORHEXIDINE GLUCONATE 0.12 % MT SOLN
15.0000 mL | Freq: Two times a day (BID) | OROMUCOSAL | Status: DC
  Administered 2013-12-10 (×2): 15 mL via OROMUCOSAL
  Filled 2013-12-10 (×3): qty 15

## 2013-12-10 MED ORDER — SODIUM CHLORIDE 0.9 % IV SOLN
INTRAVENOUS | Status: DC
  Administered 2013-12-11 – 2013-12-12 (×2): 10 mL/h via INTRAVENOUS

## 2013-12-10 MED ORDER — METHYLPREDNISOLONE SODIUM SUCC 125 MG IJ SOLR
125.0000 mg | Freq: Three times a day (TID) | INTRAMUSCULAR | Status: DC
  Filled 2013-12-10 (×4): qty 2

## 2013-12-10 MED ORDER — HYDRALAZINE HCL 50 MG PO TABS
75.0000 mg | ORAL_TABLET | Freq: Three times a day (TID) | ORAL | Status: DC
  Administered 2013-12-10 (×3): 75 mg via ORAL
  Filled 2013-12-10 (×8): qty 1

## 2013-12-10 NOTE — Progress Notes (Signed)
Rt Note: I checked with pt about wearing CPAP she states she is not ready to wear at this time. RT will continue to monitor.

## 2013-12-10 NOTE — Progress Notes (Signed)
[facility] PROGRESS NOTE  ADAMARIS KING ZOX:[id number] DOB: 08-Nov-1951 DOA: 12/03/2013 PCP: Kevan Ny, MD  Off service summary: 63 year old obese female with history of acute mild systolic and diastolic failure, type 2 diabetes mellitus, CKD stage IV, paroxysmal A. fib (not on anticoagulation due to GI bleed and leg hematoma), chronic anemia, obstructive sleep apnea/OHS noncompliant with CPAP, hypertension, and a recent hospitalization for respiratory failure associated with CHF who was brought to the ED with worsening shortness of breath over 24hrs.  EMS was called and as she was found to be tachypneic and tachycardic. She was placed on BiPAP and brought to the ED. An ABG done in the ED showed a pH of 7.2, PCO2 of 54 and PO2 of 167. Patient O2 sats were stable on BiPAP. Blood work showed leukocytosis with WBC of 14.7, hemoglobin of 10.2, platelets of 216. Sodium of 145, potassium 4.2, chloride 112, BUN of 46, creatinine of 2.1 and glucose of 211. BNP was 1900. Chest x-ray showed bilateral pulmonary opacity suggestive likely of pulmonary edema with possible bilateral pneumonia.  This morning of 12/10/2013 patient acute respirator distress. Patient using accessory muscles of respiration. Patient using thoracoabdominal breathing.   Assessment/Plan: Acute hypoxic and hypercarbic respiratory failure /Acute diastolic CHF exacerbation  Patient currently hypoxic with sats of 70% on 5 L nasal cannula. Patient in acute respiratory distress. Questionable etiology. 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 I/O combined systolic and equal -2.9 L over the past 24 hours. Patient was on BiPAP earlier on during the hospitalization and this was discontinued as was improving with diuresis using CPAP QHS as per prescribed home regimen  - NOTE: multiple admission for this issue - likely should be discharged with a higher dose of Lasix due to fluid indiscretion at home   - EF 50-55% via TTE 10/15/2013 - at time of discharge 11/22/13 wgt was 118.4 kg - weight at presentation 131kg. current weight is 127 kg. - thus far 11.8 L negative balance - titrated Lasix down to 40 BID due to rising creatinine on 12/08/2013 - cont to follow Is/Os and wgts  - Per Dr Terrence Dupont he will see her as an outpt  Patient currently in acute respiratory distress. Likely worsen acute on chronic diastolic CHF. Lung exam with diffuse by basilar crackles and poor air movement and increased use of accessory muscles of respiration. Will check a stat chest x-ray. Will get a pro BNP. Will check ABG. Will repeat a compressive metabolic profile. Check a CBC. Check an anemia panel. Will check a EKG. Cycle cardiac enzymes every 6 hours x3. Will place on the BiPAP. Increase Lasix to 60 mg IV every 12. Transferred to the step down versus ICU pending critical care evaluation. Will consult with critical care for further evaluation and management. A-fib with RVR on Chronic Afib- recently sinus brady  -not an anticoagulation due to GI bleed and leg hematoma  -had to initiate IV Cardizem short term - pt subsequently converted back to sinus Bradycardia a few hours later  -Dr. Wynelle Cleveland spoke with Dr Terrence Dupont who suggested we increase her Amiodarone to 200 mg in the AM and 100 mg in the PM  -Heart rate elevated. Will check a EKG. Will give Cardizem 10 mg IV x1. Continue amiodarone. Change albuterol to Xopenex. OHS/SA  -Previously noncompliant with CPAP -CPAP while inpatient Possible B HCAP  -Pt is afebrile w/ normal WBC - doubt this represents PNA - discontinued antibiotic therapy - follow clinically w/ diuresis  Multidrug resistant E coli UTI  -UA was not convincing of UTI, but cx has revealed >100k colonies of E coli  -Given fosfomycin on 12/06/13 as pt is allergic to multp drugs and bacteria is resistant to many others  HTN  Continue Norvasc. Increase hydralazine to 75 mg 3 times a day. Continue Isordil.  Continue diuretics. DM2 w/ renal complications  Hemoglobin A1c was 5.4 on 10/13/2013. CBG well controlled at the present time. Continue Lantus. Sliding scale insulin. Follow. CKD (chronic kidney disease), stage IV  Baseline creatinine appears to be approximately 2.7 -Remains at baseline  Anemia - chronic  Hemoglobin was as low as 7.5 dating back to 11/19/2013 - follow trend - no evidence of blood loss - transfuse if drops below 7.0. Check an anemia panel. Follow H&H.  Morbid obesity - Body mass index is 50.78 kg/(m^2).  Wound of right leg  Calf hematoma Nov 2014 requiring surgical I&D w skin graft for closure - wound appears to be in great shape  Code Status: Full Family Communication: Updated patient no family at bedside. Disposition Plan: Transfer to step down vs ICU.   Consultants:  None  Procedures:  Chest x-ray 12/03/2013, 12/06/2013  Antibiotics: Levaquin 12/26 >> 12/28  Vancomycin 12/26 >> 12/28  Fosfomycin 12/06/13  HPI/Subjective: Was called per nursing. Patient woke up in acute respiratory distress. Patient complaining of significant shortness of breath. Patient denies any chest pain. Patient screaming please help me.  Objective: Filed Vitals:   12/09/13 2150 12/10/13 0137 12/10/13 0524 12/10/13 0824  BP: 156/58  174/68   Pulse: 68 88 65   Temp: 97.3 F (36.3 C)  98.6 F (37 C)   TempSrc: Oral  Oral   Resp: 18 15 17    Height:      Weight:   127 kg (279 lb 15.8 oz)   SpO2:  96% 93% 85%    Intake/Output Summary (Last 24 hours) at 12/10/13 0842 Last data filed at 12/10/13 0700  Gross per 24 hour  Intake    480 ml  Output   3702 ml  Net  -3222 ml   Filed Weights   12/08/13 0604 12/09/13 0500 12/10/13 0524  Weight: 125.9 kg (277 lb 9 oz) 126.4 kg (278 lb 10.6 oz) 127 kg (279 lb 15.8 oz)    Exam:   General:  Awake. Using the accessory muscles of respiration. Thoracoabdominal breathing.  Cardiovascular: Tachycardic. Regular rhythm. Positive  JVD.  Respiratory: Bibasilar crackles. Poor air movement. Using accessory muscles of respiration. Thoracoabdominal breathing. Some minimal historian respiratory wheezing.  Abdomen: soft, nondistended, positive bowel sounds.  Musculoskeletal: perfused, no clubbing. 1-2+ bilateral lower extremity edema.  Data Reviewed: Basic Metabolic Panel:  Recent Labs Lab 12/05/13 0555 12/06/13 0400 12/07/13 0454 12/08/13 0425 12/09/13 0508  NA 142 139 144 146 148*  K 4.8 4.7 4.8 4.9 4.7  CL 110 107 107 109 110  CO2 23 22 24 26 28   GLUCOSE 85 72 87 79 56*  BUN 53* 57* 62* 56* 51*  CREATININE 2.65* 2.83* 3.06* 2.66* 2.30*  CALCIUM 8.4 8.1* 7.8* 8.1* 8.4   Liver Function Tests:  Recent Labs Lab 12/05/13 0555  AST 7  ALT 6  ALKPHOS 95  BILITOT 0.2*  PROT 6.9  ALBUMIN 2.5*   No results found for this basename: LIPASE, AMYLASE,  in the last 168 hours No results found for this basename: AMMONIA,  in the last 168 hours CBC:  Recent Labs Lab 12/03/13 1040 12/03/13 1052 12/04/13 0445 12/06/13  0559 12/07/13 0454 12/08/13 0425  WBC 14.7*  --  8.0 6.1 5.7 4.7  NEUTROABS 13.5*  --  6.9  --   --   --   HGB 8.8* 10.2* 7.5* 7.2* 7.2* 7.1*  HCT 29.7* 30.0* 26.0* 24.8* 24.2* 23.9*  MCV 91.1  --  92.2 91.2 90.3 89.8  PLT 216  --  193 184 162 175   Cardiac Enzymes:  Recent Labs Lab 12/07/13 1008  CKTOTAL 34  CKMB 1.4  TROPONINI <0.30   BNP (last 3 results)  Recent Labs  11/17/13 1052 12/03/13 1329 12/07/13 0454  PROBNP 2548.0* 1911.0* 4262.0*   CBG:  Recent Labs Lab 12/09/13 0633 12/09/13 1144 12/09/13 1627 12/09/13 2154 12/10/13 0639  GLUCAP 79 186* 177* 84 71    Recent Results (from the past 240 hour(s))  CULTURE, BLOOD (ROUTINE X 2)     Status: None   Collection Time    12/03/13  1:15 PM      Result Value Range Status   Specimen Description BLOOD ARM RIGHT   Final   Special Requests BOTTLES DRAWN AEROBIC AND ANAEROBIC 10CC   Final   Culture  Setup Time      Final   Value: 12/03/2013 16:58     Performed at [facility]     Final   Value: NO GROWTH 5 DAYS     Performed at [facility]   Report Status 12/09/2013 FINAL   Final  CULTURE, BLOOD (ROUTINE X 2)     Status: None   Collection Time    12/03/13  1:30 PM      Result Value Range Status   Specimen Description BLOOD HAND RIGHT   Final   Special Requests BOTTLES DRAWN AEROBIC AND ANAEROBIC 10CC   Final   Culture  Setup Time     Final   Value: 12/03/2013 16:57     Performed at [facility]   Culture     Final   Value: NO GROWTH 5 DAYS     Performed at [facility]   Report Status 12/09/2013 FINAL   Final  URINE CULTURE     Status: None   Collection Time    12/03/13  6:32 PM      Result Value Range Status   Specimen Description URINE, CATHETERIZED   Final   Special Requests NONE   Final   Culture  Setup Time     Final   Value: 12/04/2013 01:50     Performed at [place]     Final   Value: >=100,000 COLONIES/ML     Performed at [facility]   Culture     Final   Value: ESCHERICHIA COLI     LACTOBACILLUS SPECIES     Note: Standardized susceptibility testing for this organism is not available.     Performed at [facility]   Report Status 12/06/2013 FINAL   Final   Organism ID, Bacteria ESCHERICHIA COLI   Final     Studies: No results found.  Scheduled Meds: . albuterol  2.5 mg Nebulization TID  . allopurinol  100 mg Oral Daily  . ALPRAZolam  0.25 mg Oral QHS  . amiodarone  100 mg Oral QHS  . amiodarone  200 mg Oral Daily  . amLODipine  10 mg Oral Daily  . aspirin  325 mg Oral Daily  . atorvastatin  20 mg Oral QHS  . docusate  sodium  100 mg Oral BID  . ferrous sulfate  325 mg Oral TID WC  . furosemide  40 mg Intravenous BID  . heparin  5,000 Units Subcutaneous Q8H  . hydrALAZINE  75 mg Oral TID  . insulin aspart  0-15 Units Subcutaneous TID WC  . insulin glargine  15 Units  Subcutaneous BID  . isosorbide dinitrate  10 mg Oral BID  . metoCLOPramide  5 mg Oral TID  . pantoprazole  40 mg Oral Daily  . polyethylene glycol  17 g Oral BID  . potassium chloride  20 mEq Oral Daily  . traZODone  25 mg Oral QHS   Continuous Infusions:   Principal Problem:   Acute respiratory failure Active Problems:   OBSTRUCTIVE SLEEP APNEA   HYPERTENSION   Acute combined systolic and diastolic heart failure   DM (diabetes mellitus), type 2 with renal complications   CKD (chronic kidney disease), stage IV   Obesity   OSA (obstructive sleep apnea)   A-fib   Acute respiratory failure with hypercapnia   HCAP (healthcare-associated pneumonia)   Hypernatremia  Time spent: 45min  [facility] md [facility] Pager [phone]. If 7PM-7AM, please contact night-coverage at [email or web address], password [facility] 12/10/2013, 8:42 AM  LOS: 7 days

## 2013-12-10 NOTE — Consult Note (Signed)
Name: [name] MRN: [id number] DOB: 12/15/1950    ADMISSION DATE:  12/03/2013 CONSULTATION DATE:  12/10/12  REFERRING MD :  Dr. Grandville Silos PRIMARY SERVICE: [facility]-->PCCM  CHIEF COMPLAINT:  Respiratory Distress  BRIEF PATIENT DESCRIPTION: 63 y/o F admitted on 12/26 with progressive SOB post discharge on 12/15.  Presented w hypoxic resp fx, pulmonary edema and E-Coli UTI, admitted per [facility].  1/2 noted to have afib with RVR and decompensation from respiratory standpoint.  PCCM consulted for evaluation.  Echo 10/2013 - nml EF, gr 1 diastolic dysfn  SIGNIFICANT EVENTS / STUDIES:  12/15 - d/c from [facility] with CHF exacerbation to SNF ................................................................................................... 12/26 - Re-admit with progressive dyspnea, chf, E-Coli UTI 01/02 - Tx to ICU w acute resp fx in setting of CHF, AFIB  LINES / TUBES: PIV  CULTURES: UC 12/26>>>E-Coli>>>sens rocephin BCx2 12/26>>>neg  ANTIBIOTICS: Fosfomycin 12/29>>>x1  HISTORY OF PRESENT ILLNESS:  63 y/o F with complex medical history to include GERD, DM, CVA, CKD, Morbid Obesity, HTN, AFIB not on anticoagulation due to GIB Hx, OSA (not on CPAP), Diastolic CHF (EF of 99991111 10/15/13)who was recently admitted to [facility] from 12/10-12/15 for acute resp fx in the setting of CHF.  Patient was discharged to SNF at that time.  She continued to progressive worsening of SOB, dry cough, orthopnea and PND.  Presented to ER via EMS with tachypnea, tachycardia, & hypoxia.  She was placed on BiPAP with improvement in symptoms.  Initial ABG demonstrated ph 7.2 / 54 / 167.  WBC 14.7, BNP 1900, sr cr 2.1, CXR with bilateral pulmonary edema.    12/10/12 patient was noted to have acute worsening of SOB and was placed on BiPAP, tx to ICU.  She had re-occurrence of AFib RVR noted on EKG from 12/30.  She was placed on bipap and given lasix.  Patient denies fevers, chills, N/V/D, chest pain.  Reports improvement in SOB with  bipap.    PAST MEDICAL HISTORY :  Past Medical History  Diagnosis Date  . Asthma   . Bronchitis   . Allergic rhinitis   . HTN (hypertension)   . Hyperlipidemia   . Obesity   . OSA (obstructive sleep apnea)     poor compliance with cpap  . CHF (congestive heart failure)   . Biventricular failure     compensated  . Psoriasis   . Stasis edema     of lower extremities  . Depression   . Situational stress   . Anemia   . Gout   . Chronic anticoagulation   . Yeast infection involving the vagina and surrounding area   . Atrial fibrillation with RVR   . GERD (gastroesophageal reflux disease)   . Chronic bronchitis     "get it q yr" (08/10/2013)  . Pneumonia     "said I did on 08/03/2013 then they ruled it out" (08/10/2013)  . Shortness of breath     "all the time" (08/10/2013)  . On home oxygen therapy     "2L during the night and prn during the day" (08/10/2013)  . Type II diabetes mellitus   . History of blood transfusion     "few during my lifetime; last time was 4 days ago when I got 2 units" (08/10/2013)  . Stroke ?2007    denies residuals on 08/10/2013  . DJD (degenerative joint disease)   . Bleeding     "from my skin folds; just won't stop" (08/10/2013)  . Complication of anesthesia     "  they gave me too much; I stayed out of it for awhile" (08/10/2013)  . Anxiety   . Kidney stones   . Kidney disease, chronic, stage III (GFR 30-59 ml/min)   . Chronic kidney disease, stage IV (severe)   . Morbid obesity    Past Surgical History  Procedure Laterality Date  . Appendectomy    . Cholecystectomy    . Total knee arthroplasty Left ~ 2006  . Total abdominal hysterectomy    . Back surgery    . Cystectomy Left     hand  . Tonsillectomy    . Joint replacement    . Lumbar disc surgery    . Colonoscopy N/A 08/16/2013    Procedure: COLONOSCOPY;  Surgeon: Beryle Beams, MD;  Location: [facility];  Service: Endoscopy;  Laterality: N/A;  . Hematoma evacuation Right 09/10/2013     Procedure: EVACUATION OF RIGHT LEG HEMATOMA AND EXCISION [place] RIGHT LEG ;  Surgeon: Merrie Roof, MD;  Location: [facility];  Service: General;  Laterality: Right;  . Debridement and closure wound Right 09/22/2013    Procedure: IRRIGATION AND DEBRIDEMENT SURGICAL PREP PLACEMENT OF [place]  ;  Surgeon: Irene Limbo, MD;  Location: [facility];  Service: Plastics;  Laterality: Right;  . Skin split graft Right 10/07/2013    Procedure: SPLIT THICKNESS SKIN GRAFT RIGHT THIGH TO RIGHT LEG, PLACEMENT OF A CEL AND WOUND VAC TO RIGHT THIGH  ;  Surgeon: Irene Limbo, MD;  Location: [facility];  Service: Plastics;  Laterality: Right;   Prior to Admission medications   Medication Sig Start Date End Date Taking? Authorizing Provider  albuterol (PROVENTIL HFA;VENTOLIN HFA) 108 (90 BASE) MCG/ACT inhaler Inhale 2 puffs into the lungs every 6 (six) hours as needed for wheezing or shortness of breath.    Yes Historical Provider, MD  albuterol (PROVENTIL) (2.5 MG/3ML) 0.083% nebulizer solution Take 2.5 mg by nebulization every 6 (six) hours as needed for wheezing or shortness of breath.   Yes Historical Provider, MD  allopurinol (ZYLOPRIM) 100 MG tablet Take 100 mg by mouth daily. Patient takes at 0800am daily 09/16/11  Yes Historical Provider, MD  amiodarone (PACERONE) 200 MG tablet Take 200 mg by mouth daily. Patient takes at 0800am daily   Yes Historical Provider, MD  amLODipine (NORVASC) 10 MG tablet Take 10 mg by mouth daily. Patient takes at 0800am daily 08/19/13  Yes Thurnell Lose, MD  aspirin 325 MG tablet Take 325 mg by mouth daily.   Yes Historical Provider, MD  atorvastatin (LIPITOR) 20 MG tablet Take 20 mg by mouth at bedtime. Patient takes at 2100   Yes Historical Provider, MD  docusate sodium (COLACE) 100 MG capsule Take 100 mg by mouth 2 (two) times daily.   Yes Historical Provider, MD  ferrous sulfate 325 (65 FE) MG tablet Take 325 mg by mouth 3 (three) times daily with meals. Patient takes  at 0800, 1200, and 1700 08/29/12  Yes Rogers Blocker, MD  furosemide (LASIX) 40 MG tablet Take 40 mg by mouth 2 (two) times daily. Patient takes at 0800am daily 08/19/13  Yes Thurnell Lose, MD  hydrALAZINE (APRESOLINE) 50 MG tablet Take 50 mg by mouth 3 (three) times daily.   Yes Historical Provider, MD  insulin glargine (LANTUS) 100 UNIT/ML injection Inject 15 Units into the skin 2 (two) times daily.   Yes Historical Provider, MD  isosorbide dinitrate (ISORDIL) 10 MG tablet Take 1 tablet (10 mg total) by mouth  2 (two) times daily. 11/22/13  Yes Marinda Elk, MD  metoCLOPramide (REGLAN) 5 MG tablet Take 5 mg by mouth 3 (three) times daily.   Yes Historical Provider, MD  metoprolol tartrate (LOPRESSOR) 25 MG tablet Take 12.5 mg by mouth 2 (two) times daily.   Yes Historical Provider, MD  omeprazole (PRILOSEC) 20 MG capsule Take 20 mg by mouth daily.   Yes Historical Provider, MD  pantoprazole (PROTONIX) 40 MG tablet Take 40 mg by mouth daily.   Yes Historical Provider, MD  polyethylene glycol (MIRALAX / GLYCOLAX) packet Take 17 g by mouth 2 (two) times daily.   Yes Historical Provider, MD  potassium chloride (KLOR-CON) 20 MEQ packet Take 20 mEq by mouth daily.   Yes Historical Provider, MD  traZODone (DESYREL) 50 MG tablet Take 25 mg by mouth at bedtime.   Yes Historical Provider, MD  tuberculin 5 UNIT/0.1ML injection Inject 0.1 mLs into the skin once.   Yes Historical Provider, MD   Allergies  Allergen Reactions  . Daptomycin Other (See Comments)    Elevated CPK  . Morphine And Related Nausea And Vomiting  . Cephalexin Rash  . Tramadol Nausea And Vomiting  . Celecoxib Other (See Comments)    Unknown, can use furosemide without issue  . Codeine Other (See Comments)    unknown  . Fluoxetine Hcl Other (See Comments)    unknown  . Latex Other (See Comments)  . Ofloxacin Other (See Comments)    unknown  . Penicillins Other (See Comments)    unknown  . Rofecoxib Other (See Comments)     unknown  . Sulfonamide Derivatives Other (See Comments)    unknown    FAMILY HISTORY:  Family History  Problem Relation Age of Onset  . Heart attack Father   . Asthma Father   . Heart disease Paternal Uncle   . Rectal cancer Paternal Aunt   . Other Mother     mva   SOCIAL HISTORY:  reports that she quit smoking about 31 years ago. Her smoking use included Cigarettes. She has a 1.44 pack-year smoking history. She has never used smokeless tobacco. She reports that she does not drink alcohol or use illicit drugs.  REVIEW OF SYSTEMS:  See HPI for pertinent positives.  All systems reviewed and otherwise negative.   SUBJECTIVE:   VITAL SIGNS: Temp:  [97 F (36.1 C)-98.6 F (37 C)] 98.6 F (37 C) (01/02 0524) Pulse Rate:  [65-145] 123 (01/02 0855) Resp:  [15-46] 46 (01/02 0840) BP: (125-174)/(58-110) 171/96 mmHg (01/02 0840) SpO2:  [85 %-100 %] 100 % (01/02 0855) Weight:  [279 lb 15.8 oz (127 kg)] 279 lb 15.8 oz (127 kg) (01/02 0524)  HEMODYNAMICS:    VENTILATOR SETTINGS:    INTAKE / OUTPUT: Intake/Output     01/01 0701 - 01/02 0700 01/02 0701 - 01/03 0700   P.O. 720 240   Total Intake(mL/kg) 720 (5.7) 240 (1.9)   Urine (mL/kg/hr) 3700 (1.2)    Stool 2 (0)    Total Output 3702     Net -2982 +240        Urine Occurrence  2 x     PHYSICAL EXAMINATION: General:  Morbidly obese female in NAD on bipap Neuro:  AAOx4, speech clear, MAE HEENT:  Mm pink/moist, short thick neck Cardiovascular:  s1s2 intermittently irr, now in SR Lungs:  resp's even/non-labored on bipap, lungs bilaterally distant, crackles lower posterior Abdomen:  Obese/soft,bsx4 active Musculoskeletal:  No acute deformities Skin:  Warm/dry,  LE scaling / venous insufficiency, edema  LABS:  CBC  Recent Labs Lab 12/07/13 0454 12/08/13 0425 12/10/13 0915  WBC 5.7 4.7 8.8  HGB 7.2* 7.1* 8.9*  HCT 24.2* 23.9* 29.6*  PLT 162 175 201   Coag's No results found for this basename: APTT, INR,  in the  last 168 hours  BMET  Recent Labs Lab 12/08/13 0425 12/09/13 0508 12/10/13 0600  NA 146 148* 145  K 4.9 4.7 4.7  CL 109 110 106  CO2 26 28 28   BUN 56* 51* 46*  CREATININE 2.66* 2.30* 2.06*  GLUCOSE 79 56* 57*   Electrolytes  Recent Labs Lab 12/08/13 0425 12/09/13 0508 12/10/13 0600  CALCIUM 8.1* 8.4 8.8   Sepsis Markers No results found for this basename: LATICACIDVEN, PROCALCITON, O2SATVEN,  in the last 168 hours ABG  Recent Labs Lab 12/03/13 1145 12/04/13 0019 12/10/13 0838  PHART 7.206* 7.257* 7.225*  PCO2ART 54.0* 49.4* 71.4*  PO2ART 167.0* 209.0* 70.5*   Liver Enzymes  Recent Labs Lab 12/05/13 0555  AST 7  ALT 6  ALKPHOS 95  BILITOT 0.2*  ALBUMIN 2.5*   Cardiac Enzymes  Recent Labs Lab 12/03/13 1329 12/07/13 0454 12/07/13 1008  TROPONINI  --   --  <0.30  PROBNP 1911.0* 4262.0*  --    Glucose  Recent Labs Lab 12/08/13 2222 12/09/13 0633 12/09/13 1144 12/09/13 1627 12/09/13 2154 12/10/13 0639  GLUCAP 89 79 186* 177* 84 71    Imaging Dg Chest Port 1 View  12/10/2013   CLINICAL DATA:  Shortness of breath.  EXAM: PORTABLE CHEST - 1 VIEW  COMPARISON:  12/06/2013  FINDINGS: Lungs are adequately inflated with slight worsening of diffuse bilateral airspace process likely infection and less likely edema. This process has worsened over the left mid to upper lung and right upper lobe. Cannot exclude a small mild pleural fluid in the lung bases. Stable cardiomegaly. Remainder the exam is unchanged.  IMPRESSION: Worsening bilateral diffuse airspace process likely infection and less likely edema. Cannot exclude a small amount of bilateral pleural fluid.  Stable cardiomegaly.   Electronically Signed   By: Marin Olp M.D.   On: 12/10/2013 09:04    ASSESSMENT / PLAN:  PULMONARY A: Acute Respiratory Failure - multi-factorial with decompensated CHF, untreated OSA, AFib with RVR Hypoxemia OSA / OHS P:   -bipap support PRN & QHS -f/u  cxr -BP/Rate control -reduce steroids, rapid taper to off -Xopenex -low suspicion for pulmonary infection  -limit sedating medications with OSA & Obesity  CARDIOVASCULAR A:  HTN Diastolic CHF AFib - with RVR.  Not on anti-coagulation due to GIB hx.  Followed by Dr. Terrence Dupont P:  -rate & BP control, amiodarone -ASA -Dr. Terrence Dupont consulted, appreciate input -lasix as renal fxn / BP permit -assess enzymes -trend EKG / rhythm monitoring   RENAL A:   CKD P:   -trend BMP -lasix as above  GASTROINTESTINAL A:   Morbid Obesity Hx of GIB / Hematoma of R Calf - required surgical I&D with graft for closure 11/14 P:   -reglan -NPO while on BiPAP  HEMATOLOGIC A:   Anemia P:  -continue Iron -trend CBC  INFECTIOUS A:   E-Coli UTI - treated. P:   -monitor fever curve / leukocytosis -abx / cultures as above -Add empiric zosyn/ vanc for HCAP with low threshold to stop once clinically improved  ENDOCRINE A:   DM   P:   -SSI , lantus  NEUROLOGIC A:   No acute  process P:   -supportive care  Noe Gens, NP-C [facility] Pgr: [phone] or [phone]    I have personally obtained a history, examined the patient, evaluated laboratory and imaging results, formulated the assessment and plan and placed orders.  CRITICAL CARE: The patient is critically ill with multiple organ systems failure and requires high complexity decision making for assessment and support, frequent evaluation and titration of therapies, application of advanced monitoring technologies and extensive interpretation of multiple databases. Critical Care Time devoted to patient care services described in this note is  50 minutes.   Wiley Magan V.  12/10/2013, 10:20 AM

## 2013-12-10 NOTE — Progress Notes (Signed)
Patient transferred to [facility]. CSW reported off to Education officer, museum on that unit who will follow up with patient.  Jeanette Caprice, MSW, [place]

## 2013-12-10 NOTE — Progress Notes (Signed)
CSW spoke with pt, as per note from pt's previous CSW pt would prefer to go home rather than return to [facility]. CSW asked pt about her wishes for discharge. Pt states "whatever you think is best." CSW explained that the medical team believes pt would benefit from more rehab before returning home, but that this decision ultimately is up to the pt. CSW offered to have RNCM speak with pt about home health, and allow pt to think about what she would like to do. Pt would like this, so CSW informed RNCM and RNCM is speaking with pt now. CSW continues to follow case and will provide services if appropriate.   Ky Barban, MSW, [facility] Clinical Social Worker [phone]

## 2013-12-10 NOTE — Progress Notes (Signed)
Pt placed on cpap with 2 lpm bleed in 02 tol well no issues to report at this time.

## 2013-12-10 NOTE — Progress Notes (Signed)
Chaplain responded to page from nurse concerning pt who wanted a chaplain visit. Chaplain had difficulty understanding patient as she had an oxygen mask on. Pt's husband entered and helped translate. Pt asked for prayer and chaplain offered it. Chaplain also spoke with pt's husband about how he was coping. He stated that he was carrying a lot because their two sons couldn't "handle seeing their mother sick," and would not help out with her care. He also confirmed that Ree Edman, their local pastor's wife, had been integral in pt's care. Pt's husband expressed thanks for visit. Chaplain will follow up as necessary.

## 2013-12-10 NOTE — Care Management Note (Addendum)
    Page 1 of 2   12/13/2013     3:02:21 PM   CARE MANAGEMENT NOTE 12/13/2013  Patient:  [name], [name]   Account Number:  [id number]  Date Initiated:  12/06/2013  Documentation initiated by:  Marvetta Gibbons  Subjective/Objective Assessment:   Pt admitted with acute resp. failure     Action/Plan:   PTA pt was at [facility] consulted for placement needs.   Anticipated DC Date:     Anticipated DC Plan:  SKILLED NURSING FACILITY  In-house referral  Clinical Social Worker      DC Planning Services  CM consult      Choice offered to / List presented to:          [facility] arranged  HH-1 RN  HH-2 PT      [facility] agency  [facility] [facility]   Status of service:  Completed, signed off Medicare Important Message given?   (If response is "NO", the following Medicare IM given date fields will be blank) Date Medicare IM given:   Date Additional Medicare IM given:    Discharge Disposition:    Per UR Regulation:  Reviewed for med. necessity/level of care/duration of stay  If discussed at [place] of Stay Meetings, dates discussed:    Comments:  12/13/13 [place], RN, BSN, [place] [phone] Pt husband states that he can not care for her alone at this time.  Pt upset but is willing to return to SNF to get stronger for home.  NCM will continue to follow.  1/2 1429 Nancy dowell rn,bsn spoke w sw. pt hopes she will be able to return home w husband after disch. sw to cont to follow for progress. spoke w pt who would like to go home if possible and use [facility] again for hhc. spoke w [name] at [facility] and they will follow along and if goes home will provide hhrn and pt.

## 2013-12-10 NOTE — Progress Notes (Signed)
Pt refusing to take heparin injections SQ due to side effects (itching). Notified Elink MD. New orders to d/c heparin sq and place SCDs.

## 2013-12-10 NOTE — Consult Note (Signed)
Reason for Consult: Congestive heart failure/A. fib with RVR Referring Physician: CCM  [name] is an 63 y.o. female.  HPI: Patient is 63 year old female with past medical history significant for multiple medical problems multiple recent admissions also multiple admissions in the past, Friday coronary artery disease, hypertension, COPD, history of recurrent A. 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 Cardiologic consultation was called as patient was transferred today to CCU with acute respiratory failure and was noted to be in A. fib with RVR. Patient spontaneously converted back into sinus rhythm. Patient denies any chest pain. Presently on BiPAP very little history could be obtained due to BiPAP but states overall breathing has improved. Patient is not the good candidate for chronic anticoagulation due to GI bleeding in the past. Patient has been maintained on amiodarone since cardioversion and has mostly remained in sinus rhythm.  Past Medical History  Diagnosis Date  . Asthma   . Bronchitis   . Allergic rhinitis   . HTN (hypertension)   . Hyperlipidemia   . Obesity   . OSA (obstructive sleep apnea)     poor compliance with cpap  . CHF (congestive heart failure)   . Biventricular failure     compensated  . Psoriasis   . Stasis edema     of lower extremities  . Depression   . Situational stress   . Anemia   . Gout   . Chronic anticoagulation     off due  to GIB, thigh hematoma  . Yeast infection involving the vagina and surrounding area   . Atrial fibrillation with RVR   . GERD (gastroesophageal reflux disease)   . Chronic bronchitis     "get it q yr" (08/10/2013)  . Pneumonia     "said I did on 08/03/2013 then they ruled it out" (08/10/2013)  . Shortness of breath     "all the time" (08/10/2013)  . On home oxygen therapy     "2L during the night and prn during the day" (08/10/2013)  . Type II diabetes mellitus   . History of blood transfusion     "few during my lifetime; last time was 4 days ago when I got 2 units" (08/10/2013)  . Stroke ?2007    denies residuals on 08/10/2013  . DJD (degenerative joint disease)   . Bleeding     "from my skin folds; just won't stop" (08/10/2013)  . Complication of anesthesia     "they gave me too much; I stayed out of it for awhile" (08/10/2013)  . Anxiety   . Kidney stones   . Kidney disease, chronic, stage III (GFR 30-59 ml/min)   . Chronic kidney disease, stage IV (severe)   . Morbid obesity     Past Surgical History  Procedure Laterality Date  . Appendectomy    . Cholecystectomy    . Total knee arthroplasty Left ~ 2006  . Total abdominal hysterectomy    . Back surgery    . Cystectomy Left     hand  . Tonsillectomy    .  Joint replacement    . Lumbar disc surgery    . Colonoscopy N/A 08/16/2013    Procedure: COLONOSCOPY;  Surgeon: Beryle Beams, MD;  Location: [place];  Service: Endoscopy;  Laterality: N/A;  . Hematoma evacuation Right 09/10/2013    Procedure: EVACUATION OF RIGHT LEG HEMATOMA AND EXCISION [place] RIGHT LEG ;  Surgeon: Merrie Roof, MD;  Location: [facility];  Service: General;  Laterality: Right;  . Debridement and closure wound Right 09/22/2013    Procedure: IRRIGATION AND DEBRIDEMENT SURGICAL PREP PLACEMENT OF [place]  ;  Surgeon: Irene Limbo, MD;  Location: [facility];  Service: Plastics;  Laterality: Right;  . Skin split graft Right 10/07/2013    Procedure: SPLIT  THICKNESS SKIN GRAFT RIGHT THIGH TO RIGHT LEG, PLACEMENT OF A CEL AND WOUND VAC TO RIGHT THIGH  ;  Surgeon: Irene Limbo, MD;  Location: [facility];  Service: Plastics;  Laterality: Right;    Family History  Problem Relation Age of Onset  . Heart attack Father   . Asthma Father   . Heart disease Paternal Uncle   . Rectal cancer Paternal Aunt   . Other Mother     mva    Social History:  reports that she quit smoking about 31 years ago. Her smoking use included Cigarettes. She has a 1.44 pack-year smoking history. She has never used smokeless tobacco. She reports that she does not drink alcohol or use illicit drugs.  Allergies:  Allergies  Allergen Reactions  . Daptomycin Other (See Comments)    Elevated CPK  . Morphine And Related Nausea And Vomiting  . Cephalexin Rash  . Tramadol Nausea And Vomiting  . Celecoxib Other (See Comments)    Unknown, can use furosemide without issue  . Codeine Other (See Comments)    unknown  . Fluoxetine Hcl Other (See Comments)    unknown  . Latex Other (See Comments)  . Ofloxacin Other (See Comments)    unknown  . Penicillins Other (See Comments)    unknown  . Rofecoxib Other (See Comments)    unknown  . Sulfonamide Derivatives Other (See Comments)    unknown    Medications: I have reviewed the patient's current medications.  Results for orders placed during the hospital encounter of 12/03/13 (from the past 48 hour(s))  GLUCOSE, CAPILLARY     Status: Abnormal   Collection Time    12/08/13  4:14 PM      Result Value Range   Glucose-Capillary 100 (*) 70 - 99 mg/dL  GLUCOSE, CAPILLARY     Status: None   Collection Time    12/08/13 10:22 PM      Result Value Range   Glucose-Capillary 89  70 - 99 mg/dL  BASIC METABOLIC PANEL     Status: Abnormal   Collection Time    12/09/13  5:08 AM      Result Value Range   Sodium 148 (*) 137 - 147 mEq/L   Comment: Please note change in reference range.   Potassium 4.7  3.7 - 5.3 mEq/L   Comment:  Please note change in reference range.   Chloride 110  96 - 112 mEq/L   CO2 28  19 - 32 mEq/L   Glucose, Bld 56 (*) 70 - 99 mg/dL   BUN 51 (*) 6 - 23 mg/dL   Creatinine, Ser 2.30 (*) 0.50 - 1.10 mg/dL   Calcium 8.4  8.4 - 10.5 mg/dL   GFR calc non Af Wyvonnia Lora  21 (*) >90 mL/min   GFR calc Af Amer 25 (*) >90 mL/min   Comment: (NOTE)     The eGFR has been calculated using the CKD EPI equation.     This calculation has not been validated in all clinical situations.     eGFR's persistently <90 mL/min signify possible Chronic Kidney     Disease.  GLUCOSE, CAPILLARY     Status: None   Collection Time    12/09/13  6:33 AM      Result Value Range   Glucose-Capillary 79  70 - 99 mg/dL  GLUCOSE, CAPILLARY     Status: Abnormal   Collection Time    12/09/13 11:44 AM      Result Value Range   Glucose-Capillary 186 (*) 70 - 99 mg/dL   Comment 1 Notify RN    GLUCOSE, CAPILLARY     Status: Abnormal   Collection Time    12/09/13  4:27 PM      Result Value Range   Glucose-Capillary 177 (*) 70 - 99 mg/dL   Comment 1 Notify RN    GLUCOSE, CAPILLARY     Status: None   Collection Time    12/09/13  9:54 PM      Result Value Range   Glucose-Capillary 84  70 - 99 mg/dL  BASIC METABOLIC PANEL     Status: Abnormal   Collection Time    12/10/13  6:00 AM      Result Value Range   Sodium 145  137 - 147 mEq/L   Comment: Please note change in reference range.   Potassium 4.7  3.7 - 5.3 mEq/L   Comment: Please note change in reference range.   Chloride 106  96 - 112 mEq/L   CO2 28  19 - 32 mEq/L   Glucose, Bld 57 (*) 70 - 99 mg/dL   BUN 46 (*) 6 - 23 mg/dL   Creatinine, Ser 2.06 (*) 0.50 - 1.10 mg/dL   Calcium 8.8  8.4 - 10.5 mg/dL   GFR calc non Af Amer 24 (*) >90 mL/min   GFR calc Af Amer 28 (*) >90 mL/min   Comment: (NOTE)     The eGFR has been calculated using the CKD EPI equation.     This calculation has not been validated in all clinical situations.     eGFR's persistently <90 mL/min signify  possible Chronic Kidney     Disease.  GLUCOSE, CAPILLARY     Status: None   Collection Time    12/10/13  6:39 AM      Result Value Range   Glucose-Capillary 71  70 - 99 mg/dL  BLOOD GAS, ARTERIAL     Status: Abnormal   Collection Time    12/10/13  8:38 AM      Result Value Range   FIO2 1.00     Delivery systems NON-REBREATHER OXYGEN MASK     pH, Arterial 7.225 (*) 7.350 - 7.450   pCO2 arterial 71.4 (*) 35.0 - 45.0 mmHg   Comment: CRITICAL RESULT CALLED TO, READ BACK BY AND VERIFIED WITH:      JAMIE YATES RN AT 2694 BY SARAH BROWNING RRT,RCP ON 12/10/2013   pO2, Arterial 70.5 (*) 80.0 - 100.0 mmHg   Bicarbonate 28.5 (*) 20.0 - 24.0 mEq/L   TCO2 30.7  0 - 100 mmol/L   Acid-Base Excess 1.5  0.0 - 2.0 mmol/L   O2 Saturation 89.2     Patient temperature 98.6  Collection site RIGHT RADIAL     Drawn by [id number]     Sample type ARTERIAL DRAW     Allens test (pass/fail) PASS  PASS  TROPONIN I     Status: None   Collection Time    12/10/13  9:15 AM      Result Value Range   Troponin I <0.30  <0.30 ng/mL   Comment:            Due to the release kinetics of cTnI,     a negative result within the first hours     of the onset of symptoms does not rule out     myocardial infarction with certainty.     If myocardial infarction is still suspected,     repeat the test at appropriate intervals.  CBC     Status: Abnormal   Collection Time    12/10/13  9:15 AM      Result Value Range   WBC 8.8  4.0 - 10.5 K/uL   RBC 3.31 (*) 3.87 - 5.11 MIL/uL   Hemoglobin 8.9 (*) 12.0 - 15.0 g/dL   HCT 29.6 (*) 36.0 - 46.0 %   MCV 89.4  78.0 - 100.0 fL   MCH 26.9  26.0 - 34.0 pg   MCHC 30.1  30.0 - 36.0 g/dL   RDW 18.8 (*) 11.5 - 15.5 %   Platelets 201  150 - 400 K/uL  PRO B NATRIURETIC PEPTIDE     Status: Abnormal   Collection Time    12/10/13  9:15 AM      Result Value Range   Pro B Natriuretic peptide (BNP) 2368.0 (*) 0 - 125 pg/mL  POCT I-STAT 3, BLOOD GAS (G3+)     Status: Abnormal    Collection Time    12/10/13 11:08 AM      Result Value Range   pH, Arterial 7.400  7.350 - 7.450   pCO2 arterial 47.2 (*) 35.0 - 45.0 mmHg   pO2, Arterial 85.0  80.0 - 100.0 mmHg   Bicarbonate 29.5 (*) 20.0 - 24.0 mEq/L   TCO2 31  0 - 100 mmol/L   O2 Saturation 97.0     Acid-Base Excess 4.0 (*) 0.0 - 2.0 mmol/L   Patient temperature 97.1 F     Collection site RADIAL, ALLEN'S TEST ACCEPTABLE     Drawn by Operator     Sample type ARTERIAL      Dg Chest Port 1 View  12/10/2013   CLINICAL DATA:  Shortness of breath.  EXAM: PORTABLE CHEST - 1 VIEW  COMPARISON:  12/06/2013  FINDINGS: Lungs are adequately inflated with slight worsening of diffuse bilateral airspace process likely infection and less likely edema. This process has worsened over the left mid to upper lung and right upper lobe. Cannot exclude a small mild pleural fluid in the lung bases. Stable cardiomegaly. Remainder the exam is unchanged.  IMPRESSION: Worsening bilateral diffuse airspace process likely infection and less likely edema. Cannot exclude a small amount of bilateral pleural fluid.  Stable cardiomegaly.   Electronically Signed   By: Marin Olp M.D.   On: 12/10/2013 09:04    Review of Systems  Unable to perform ROS: acuity of condition   Blood pressure 194/44, pulse 60, temperature 97.1 F (36.2 C), temperature source Axillary, resp. rate 21, height 5' 3" (1.6 m), weight 127 kg (279 lb 15.8 oz), SpO2 97.00%. Physical Exam  Constitutional: She is oriented to person, place, and time.  Eyes:  Conjunctivae are normal. Pupils are equal, round, and reactive to light.  Neck: Normal range of motion. Neck supple. JVD present.  Cardiovascular: Normal rate and regular rhythm.   Murmur (Soft systolic murmur and S3 gallop noted) heard. Respiratory:  Decrease breath sounds at bases with bibasilar rales  GI: Soft. Bowel sounds are normal. She exhibits distension. There is no tenderness. There is no rebound.  Musculoskeletal:  No  clubbing cyanosis 1+ edema with chronic dermatosis changes  Neurological: She is alert and oriented to person, place, and time.    Assessment/Plan: Resolving decompensated diastolic heart failure Status post paroxysmal A. 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 Doylene Canard on-call for weekend  [facility] 12/10/2013, 12:30 PM

## 2013-12-10 NOTE — Progress Notes (Signed)
OT Cancellation Note  Patient Details Name: [name] MRN: [id number] DOB: [birth year]   Cancelled Treatment:    Reason Eval/Treat Not Completed: Medical issues which prohibited therapy;Patient not medically ready. Patient transferred from [facility] to [place] due to medical complications.  [place], [place] 12/10/2013, 10:25 AM

## 2013-12-10 NOTE — Progress Notes (Signed)
ANTIBIOTIC CONSULT NOTE - INITIAL  Pharmacy Consult for vancomycin, aztreonam, and flagyl Indication: r/o HCAP  Allergies  Allergen Reactions  . Daptomycin Other (See Comments)    Elevated CPK  . Morphine And Related Nausea And Vomiting  . Cephalexin Rash  . Tramadol Nausea And Vomiting  . Celecoxib Other (See Comments)    Unknown, can use furosemide without issue  . Codeine Other (See Comments)    unknown  . Fluoxetine Hcl Other (See Comments)    unknown  . Latex Other (See Comments)  . Ofloxacin Other (See Comments)    unknown  . Penicillins Other (See Comments)    unknown  . Rofecoxib Other (See Comments)    unknown  . Sulfonamide Derivatives Other (See Comments)    unknown    Patient Measurements: Height: 5\' 3"  (160 cm) Weight: 279 lb 15.8 oz (127 kg) IBW/kg (Calculated) : 52.4  Vital Signs: Temp: 98.6 F (37 C) (01/02 0524) Temp src: Oral (01/02 0524) BP: 200/58 mmHg (01/02 1050) Pulse Rate: 69 (01/02 1050) Intake/Output from previous day: 01/01 0701 - 01/02 0700 In: 720 [P.O.:720] Out: 3702 [Urine:3700; Stool:2] Intake/Output from this shift: Total I/O In: 240 [P.O.:240] Out: -   Labs:  Recent Labs  12/08/13 0425 12/09/13 0508 12/10/13 0600 12/10/13 0915  WBC 4.7  --   --  8.8  HGB 7.1*  --   --  8.9*  PLT 175  --   --  201  CREATININE 2.66* 2.30* 2.06*  --    Estimated Creatinine Clearance: 36.3 ml/min (by C-G formula based on Cr of 2.06). No results found for this basename: VANCOTROUGH, Corlis Leak, VANCORANDOM, [place], GENTPEAK, GENTRANDOM, TOBRATROUGH, TOBRAPEAK, TOBRARND, AMIKACINPEAK, AMIKACINTROU, AMIKACIN,  in the last 72 hours   Microbiology: Recent Results (from the past 720 hour(s))  MRSA PCR SCREENING     Status: None   Collection Time    11/17/13  4:14 PM      Result Value Range Status   MRSA by PCR NEGATIVE  NEGATIVE Final   Comment:            The GeneXpert MRSA Assay (FDA     approved for NASAL specimens     only), is  one component of a     comprehensive MRSA colonization     surveillance program. It is not     intended to diagnose MRSA     infection nor to guide or     monitor treatment for     MRSA infections.  CULTURE, BLOOD (ROUTINE X 2)     Status: None   Collection Time    12/03/13  1:15 PM      Result Value Range Status   Specimen Description BLOOD ARM RIGHT   Final   Special Requests BOTTLES DRAWN AEROBIC AND ANAEROBIC 10CC   Final   Culture  Setup Time     Final   Value: 12/03/2013 16:58     Performed at [facility]   Culture     Final   Value: NO GROWTH 5 DAYS     Performed at [facility]   Report Status 12/09/2013 FINAL   Final  CULTURE, BLOOD (ROUTINE X 2)     Status: None   Collection Time    12/03/13  1:30 PM      Result Value Range Status   Specimen Description BLOOD HAND RIGHT   Final   Special Requests BOTTLES DRAWN AEROBIC AND ANAEROBIC 10CC   Final  Culture  Setup Time     Final   Value: 12/03/2013 16:57     Performed at [facility]   Culture     Final   Value: NO GROWTH 5 DAYS     Performed at [facility]   Report Status 12/09/2013 FINAL   Final  URINE CULTURE     Status: None   Collection Time    12/03/13  6:32 PM      Result Value Range Status   Specimen Description URINE, CATHETERIZED   Final   Special Requests NONE   Final   Culture  Setup Time     Final   Value: 12/04/2013 01:50     Performed at [facility]     Final   Value: >=100,000 COLONIES/ML     Performed at [facility]   Culture     Final   Value: ESCHERICHIA COLI     LACTOBACILLUS SPECIES     Note: Standardized susceptibility testing for this organism is not available.     Performed at [facility]   Report Status 12/06/2013 FINAL   Final   Organism ID, Bacteria ESCHERICHIA COLI   Final    Medical History: Past Medical History  Diagnosis Date  . Asthma   . Bronchitis   . Allergic rhinitis   . HTN (hypertension)    . Hyperlipidemia   . Obesity   . OSA (obstructive sleep apnea)     poor compliance with cpap  . CHF (congestive heart failure)   . Biventricular failure     compensated  . Psoriasis   . Stasis edema     of lower extremities  . Depression   . Situational stress   . Anemia   . Gout   . Chronic anticoagulation     off due to GIB, thigh hematoma  . Yeast infection involving the vagina and surrounding area   . Atrial fibrillation with RVR   . GERD (gastroesophageal reflux disease)   . Chronic bronchitis     "get it q yr" (08/10/2013)  . Pneumonia     "said I did on 08/03/2013 then they ruled it out" (08/10/2013)  . Shortness of breath     "all the time" (08/10/2013)  . On home oxygen therapy     "2L during the night and prn during the day" (08/10/2013)  . Type II diabetes mellitus   . History of blood transfusion     "few during my lifetime; last time was 4 days ago when I got 2 units" (08/10/2013)  . Stroke ?2007    denies residuals on 08/10/2013  . DJD (degenerative joint disease)   . Bleeding     "from my skin folds; just won't stop" (08/10/2013)  . Complication of anesthesia     "they gave me too much; I stayed out of it for awhile" (08/10/2013)  . Anxiety   . Kidney stones   . Kidney disease, chronic, stage III (GFR 30-59 ml/min)   . Chronic kidney disease, stage IV (severe)   . Morbid obesity     Medications:  Scheduled:  . allopurinol  100 mg Oral Daily  . ALPRAZolam  0.25 mg Oral QHS  . amiodarone  100 mg Oral QHS  . amiodarone  200 mg Oral Daily  . amLODipine  10 mg Oral Daily  . aspirin  325 mg Oral Daily  . atorvastatin  20 mg Oral QHS  .  diltiazem  10 mg Intravenous Once  . docusate sodium  100 mg Oral BID  . ferrous sulfate  325 mg Oral TID WC  . furosemide  20 mg Intravenous Once  . furosemide  60 mg Intravenous BID  . heparin  5,000 Units Subcutaneous Q8H  . hydrALAZINE  75 mg Oral TID  . insulin aspart  0-15 Units Subcutaneous TID WC  . insulin glargine  15  Units Subcutaneous BID  . isosorbide dinitrate  10 mg Oral BID  . levalbuterol  0.63 mg Nebulization Q8H  . methylPREDNISolone (SOLU-MEDROL) injection  40 mg Intravenous Q12H  . metoCLOPramide  5 mg Oral TID  . pantoprazole  40 mg Oral Daily  . polyethylene glycol  17 g Oral BID  . potassium chloride  20 mEq Oral Daily   Assessment: 63 yo morbidly obese F with complicated history and recent discharge from hospital on 12/15, admitted to [facility] on 12/26 for progressive SOB, hypoxic respiratory failure, pulmonary edema, and E.coli UTI.  Patient with respiratory decompensation today on 01/02 and recurrence of afib with RVR. Pharmacy consulted originally to dose vancomycin and Zosyn for r/o HCAP. Patient has antibiotic allergies to daptomycin (elevated CPK), sulfonamide derivatives, PCNs, ofloxacin (unknown reactions), and cephalosporins (rash). D/w Dr.Z and pharmacy will dose vancomycin, aztreonam, and flagyl d/t allergies.  Of note, patient treated earlier this admission with fosfomycin, levaquin, and vancomycin with resolution of UTI infection.      12/26 Levaquin>> 1 dose only 12/26 Vanco>>12/27 12/29 fosfomycin 3 gm x 1 dose  Patients SCr is trending down to 2.06 with estimated CrCl~36.  She is afebrile and WBC is wnl.  Per CCM note, low threshold to stop antibiotics once clinically improved.  Goal of Therapy: Resolution of possible infection  Vancomycin trough level 15-20 mcg/ml  Plan:  - give vancomycin IV 2.5gm x1 dose, followed by 1750mg  q24h - plan to draw vancomycin trough at [facility] - start aztreonam IV 2gm q8h - start metronidazole IV 500mg  q8h - monitor kidney function, temperature curve, WBC, any cultures, and clinical progression  Ovid Curd E. Jacqlyn Larsen, PharmD Clinical Pharmacist - Resident Pager: [phone] Pharmacy: [phone] 12/10/2013 12:37 PM

## 2013-12-10 NOTE — Progress Notes (Signed)
Plaved pt on NIV due to increased WOB

## 2013-12-11 ENCOUNTER — Inpatient Hospital Stay (HOSPITAL_COMMUNITY): Payer: Medicare HMO

## 2013-12-11 DIAGNOSIS — E870 Hyperosmolality and hypernatremia: Secondary | ICD-10-CM | POA: Diagnosis not present

## 2013-12-11 DIAGNOSIS — J189 Pneumonia, unspecified organism: Secondary | ICD-10-CM | POA: Diagnosis not present

## 2013-12-11 DIAGNOSIS — J9600 Acute respiratory failure, unspecified whether with hypoxia or hypercapnia: Secondary | ICD-10-CM | POA: Diagnosis not present

## 2013-12-11 DIAGNOSIS — I5043 Acute on chronic combined systolic (congestive) and diastolic (congestive) heart failure: Secondary | ICD-10-CM | POA: Diagnosis not present

## 2013-12-11 LAB — GLUCOSE, CAPILLARY
GLUCOSE-CAPILLARY: 141 mg/dL — AB (ref 70–99)
GLUCOSE-CAPILLARY: 136 mg/dL — AB (ref 70–99)
GLUCOSE-CAPILLARY: 119 mg/dL — AB (ref 70–99)
Glucose-Capillary: 137 mg/dL — ABNORMAL HIGH (ref 70–99)

## 2013-12-11 LAB — CBC WITH DIFFERENTIAL/PLATELET
BASOS ABS: 0 10*3/uL (ref 0.0–0.1)
BASOS PCT: 0 % (ref 0–1)
EOS ABS: 0 10*3/uL (ref 0.0–0.7)
EOS PCT: 0 % (ref 0–5)
HCT: 30.4 % — ABNORMAL LOW (ref 36.0–46.0)
Hemoglobin: 9 g/dL — ABNORMAL LOW (ref 12.0–15.0)
Lymphocytes Relative: 4 % — ABNORMAL LOW (ref 12–46)
Lymphs Abs: 0.3 10*3/uL — ABNORMAL LOW (ref 0.7–4.0)
MCH: 26.9 pg (ref 26.0–34.0)
MCHC: 29.6 g/dL — AB (ref 30.0–36.0)
MCV: 90.7 fL (ref 78.0–100.0)
MONOS PCT: 1 % — AB (ref 3–12)
Monocytes Absolute: 0.1 10*3/uL (ref 0.1–1.0)
NEUTROS ABS: 5.7 10*3/uL (ref 1.7–7.7)
Neutrophils Relative %: 95 % — ABNORMAL HIGH (ref 43–77)
Platelets: 235 10*3/uL (ref 150–400)
RBC: 3.35 MIL/uL — ABNORMAL LOW (ref 3.87–5.11)
RDW: 18.7 % — AB (ref 11.5–15.5)
WBC: 6.1 10*3/uL (ref 4.0–10.5)

## 2013-12-11 LAB — BASIC METABOLIC PANEL
BUN: 45 mg/dL — ABNORMAL HIGH (ref 6–23)
CO2: 29 mEq/L (ref 19–32)
CREATININE: 2 mg/dL — AB (ref 0.50–1.10)
Calcium: 9.1 mg/dL (ref 8.4–10.5)
Chloride: 106 mEq/L (ref 96–112)
GFR calc non Af Amer: 25 mL/min — ABNORMAL LOW (ref >90)
GFR, EST AFRICAN AMERICAN: 29 mL/min — AB (ref >90)
GLUCOSE: 146 mg/dL — AB (ref 70–99)
Potassium: 5.7 mEq/L — ABNORMAL HIGH (ref 3.7–5.3)
Sodium: 146 mEq/L (ref 137–147)

## 2013-12-11 LAB — PROCALCITONIN: Procalcitonin: 0.79 ng/mL

## 2013-12-11 MED ORDER — HYDRALAZINE HCL 20 MG/ML IJ SOLN
INTRAMUSCULAR | Status: AC
  Filled 2013-12-11: qty 1

## 2013-12-11 MED ORDER — HYDRALAZINE HCL 50 MG PO TABS
100.0000 mg | ORAL_TABLET | Freq: Three times a day (TID) | ORAL | Status: DC
  Administered 2013-12-11 – 2013-12-12 (×4): 100 mg via ORAL
  Filled 2013-12-11 (×6): qty 2

## 2013-12-11 MED ORDER — INSULIN ASPART 100 UNIT/ML ~~LOC~~ SOLN
0.0000 [IU] | Freq: Three times a day (TID) | SUBCUTANEOUS | Status: DC
  Administered 2013-12-12 – 2013-12-14 (×4): 2 [IU] via SUBCUTANEOUS

## 2013-12-11 MED ORDER — LEVALBUTEROL HCL 0.63 MG/3ML IN NEBU
0.6300 mg | INHALATION_SOLUTION | Freq: Three times a day (TID) | RESPIRATORY_TRACT | Status: DC
  Administered 2013-12-11 – 2013-12-16 (×16): 0.63 mg via RESPIRATORY_TRACT
  Filled 2013-12-11 (×31): qty 3

## 2013-12-11 MED ORDER — ISOSORBIDE DINITRATE 10 MG PO TABS: 10.0000 mg | ORAL_TABLET | Freq: Three times a day (TID) | ORAL | Status: DC

## 2013-12-11 MED ORDER — ISOSORBIDE DINITRATE 10 MG PO TABS
10.0000 mg | ORAL_TABLET | Freq: Two times a day (BID) | ORAL | Status: DC
  Administered 2013-12-11 – 2013-12-16 (×11): 10 mg via ORAL
  Filled 2013-12-11 (×12): qty 1

## 2013-12-11 NOTE — Progress Notes (Addendum)
Attending:  I have seen and examined the patient with nurse practitioner/resident and agree with the note above.   Much better with diuresis, still on facemask.  Increase BP regimen today, continue diuresis  Afib management per cardiology  BIPAP prn  Jillyn Hidden PCCM Pager: [phone] Cell: [phone] If no response, call [phone]

## 2013-12-11 NOTE — Progress Notes (Signed)
Subjective:  Feeling better. Had good diuresis yesterday. On PO amiodarone. T max 99.4 F  Objective:  Vital Signs in the last 24 hours: Temp:  [97.5 F (36.4 C)-99.4 F (37.4 C)] 97.7 F (36.5 C) (01/03 1147) Pulse Rate:  [60-124] 65 (01/03 1200) Cardiac Rhythm:  [-] Normal sinus rhythm (01/03 0750) Resp:  [18-32] 20 (01/03 1200) BP: (156-194)/(36-71) 170/40 mmHg (01/03 1200) SpO2:  [89 %-100 %] 96 % (01/03 1200) FiO2 (%):  [40 %-50 %] 40 % (01/03 1147) Weight:  [271 lb 6.2 oz (123.1 kg)] 271 lb 6.2 oz (123.1 kg) (01/03 0500)  Physical Exam: BP Readings from Last 1 Encounters:  12/11/13 170/40     Wt Readings from Last 1 Encounters:  12/11/13 271 lb 6.2 oz (123.1 kg)    Weight change: -8 lb 9.6 oz (-3.9 kg)  HEENT: [place]/AT, Eyes-Brown, PERL, EOMI, Conjunctiva-Pale, Sclera-Non-icteric Neck: + JVD, No bruit, Trachea midline. Lungs:  Clearing, Bilateral. Cardiac:  Regular rhythm, normal S1 and S2, no S3.  Abdomen:  Soft, distended, non-tender. Extremities:  Trace to 1+ edema present. No cyanosis. No clubbing. CNS: AxOx3, Cranial nerves grossly intact, moves all 4 extremities. Right handed. Skin: Warm and dry.   Intake/Output from previous day: 01/02 0701 - 01/03 0700 In: 1510 [P.O.:360; I.V.:200; IV Piggyback:950] Out: 3465 [Urine:3465]    Lab Results: BMET    Component Value Date/Time   NA 146 12/11/2013 0450   K 5.7* 12/11/2013 0450   CL 106 12/11/2013 0450   CO2 29 12/11/2013 0450   GLUCOSE 146* 12/11/2013 0450   BUN 45* 12/11/2013 0450   CREATININE 2.00* 12/11/2013 0450   CREATININE 2.65* 07/01/2013 1108   CALCIUM 9.1 12/11/2013 0450   GFRNONAA 25* 12/11/2013 0450   GFRAA 29* 12/11/2013 0450   CBC    Component Value Date/Time   WBC 6.1 12/11/2013 0450   RBC 3.35* 12/11/2013 0450   RBC 3.33* 07/06/2013 0842   HGB 9.0* 12/11/2013 0450   HCT 30.4* 12/11/2013 0450   PLT 235 12/11/2013 0450   MCV 90.7 12/11/2013 0450   MCH 26.9 12/11/2013 0450   MCHC 29.6* 12/11/2013 0450   RDW 18.7*  12/11/2013 0450   LYMPHSABS 0.3* 12/11/2013 0450   MONOABS 0.1 12/11/2013 0450   EOSABS 0.0 12/11/2013 0450   BASOSABS 0.0 12/11/2013 0450   CARDIAC ENZYMES Lab Results  Component Value Date   CKTOTAL 34 12/07/2013   CKMB 1.4 12/07/2013   TROPONINI <0.30 12/10/2013    Scheduled Meds: . allopurinol  100 mg Oral Daily  . ALPRAZolam  0.25 mg Oral QHS  . amiodarone  200 mg Oral BID  . amLODipine  10 mg Oral Daily  . antiseptic oral rinse  15 mL Mouth Rinse q12n4p  . aspirin  325 mg Oral Daily  . atorvastatin  20 mg Oral QHS  . diltiazem  10 mg Intravenous Once  . docusate sodium  100 mg Oral BID  . ferrous sulfate  325 mg Oral TID WC  . furosemide  60 mg Intravenous BID  . hydrALAZINE  100 mg Oral TID  . insulin aspart  0-15 Units Subcutaneous TID WC  . isosorbide dinitrate  10 mg Oral BID  . levalbuterol  0.63 mg Nebulization TID  . metoCLOPramide  5 mg Oral TID  . pantoprazole  40 mg Oral Daily  . polyethylene glycol  17 g Oral BID   Continuous Infusions: . sodium chloride 10 mL/hr (12/11/13 0459)   PRN Meds:.acetaminophen, hydrALAZINE, levalbuterol, ondansetron (ZOFRAN)  IV, oxyCODONE-acetaminophen  Assessment/Plan: Diastolic left heart failure, acute  Status post paroxysmal A. 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 Increase activity.   LOS: 8 days    Dixie Dials  MD  12/11/2013, 12:06 PM

## 2013-12-11 NOTE — Progress Notes (Signed)
RT Note: Placed pt on CPAP auto min 10 max 20 w 15L Bleed in Pt continued to desat in low 80's. Placed pt on Bipap 10/5 60% at this time pt is tolerating well. RT will continue to monitor

## 2013-12-11 NOTE — Progress Notes (Signed)
Name: [name] MRN: [id number] DOB: 1951-08-02    ADMISSION DATE:  12/03/2013 CONSULTATION DATE:  12/10/12  REFERRING MD :  Dr. Grandville Silos PRIMARY SERVICE: TRH-->PCCM  CHIEF COMPLAINT:  Respiratory Distress  BRIEF PATIENT DESCRIPTION: 63 y/o F admitted on 12/26 with progressive SOB post discharge on 12/15.  Presented w hypoxic resp fx, pulmonary edema and E-Coli UTI, admitted per [facility].  1/2 noted to have afib with RVR and decompensation from respiratory standpoint.  PCCM consulted for evaluation.   SIGNIFICANT EVENTS / STUDIES:  10/28/13 - Echo: nml EF, gr 1 diastolic dysfn 80/99/83 - d/c from [facility] with CHF exacerbation to SNF ................................................................................................... 12/26 - Re-admit with progressive dyspnea, chf, E-Coli UTI 01/02 - Tx to ICU w acute resp fx in setting of CHF, AFIB 01/03 - resp status improved, on VM, SR.    LINES / TUBES: PIV  CULTURES: UC 12/26>>>E-Coli>>>sens rocephin BCx2 12/26>>>neg  ANTIBIOTICS: Fosfomycin 12/29>>>x1 Vanco 1/2>>>1/3 Azactam 1/2>>>1/3 Flagyl 1/2>>>1/3   SUBJECTIVE:  No distress.  Wore CPAP overnight.  Ready to eat. BP remains elevated.   VITAL SIGNS: Temp:  [97.1 F (36.2 C)-99.4 F (37.4 C)] 98.3 F (36.8 C) (01/03 0750) Pulse Rate:  [44-145] 63 (01/03 0800) Resp:  [18-46] 21 (01/03 0800) BP: (156-205)/(43-96) 172/46 mmHg (01/03 0800) SpO2:  [89 %-100 %] 92 % (01/03 0800) FiO2 (%):  [40 %-100 %] 50 % (01/03 0750) Weight:  [271 lb 6.2 oz (123.1 kg)] 271 lb 6.2 oz (123.1 kg) (01/03 0500)  VENTILATOR SETTINGS: Vent Mode:  [-] BIPAP FiO2 (%):  [40 %-100 %] 50 % Set Rate:  [12 bmp] 12 bmp PEEP:  [6 cmH20] 6 cmH20  INTAKE / OUTPUT: Intake/Output     01/02 0701 - 01/03 0700 01/03 0701 - 01/04 0700   P.O. 360    I.V. (mL/kg) 200 (1.6)    IV Piggyback 950    Total Intake(mL/kg) 1510 (12.3)    Urine (mL/kg/hr) 3465 (1.2)    Stool     Total Output 3465     Net  -1955          Urine Occurrence 2 x    Stool Occurrence 1 x      PHYSICAL EXAMINATION: General:  Morbidly obese female in NAD  Neuro:  AAOx4, speech clear, MAE HEENT:  Mm pink/moist, short thick neck Cardiovascular:  s1s2 intermittently irr, now in SR Lungs:  resp's even/non-labored, lungs bilaterally distant, crackles lower posterior Abdomen:  Obese/soft,bsx4 active Musculoskeletal:  No acute deformities Skin:  Warm/dry, LE scaling / venous insufficiency, edema  LABS:  CBC  Recent Labs Lab 12/08/13 0425 12/10/13 0915 12/11/13 0450  WBC 4.7 8.8 6.1  HGB 7.1* 8.9* 9.0*  HCT 23.9* 29.6* 30.4*  PLT 175 201 235   BMET  Recent Labs Lab 12/10/13 0600 12/10/13 0915 12/11/13 0450  NA 145 145 146  K 4.7 4.9 5.7*  CL 106 104 106  CO2 28 25 29   BUN 46* 44* 45*  CREATININE 2.06* 2.08* 2.00*  GLUCOSE 57* 180* 146*   Sepsis Markers  Recent Labs Lab 12/10/13 1200 12/11/13 0450  PROCALCITON 0.56 0.79   ABG  Recent Labs Lab 12/10/13 0838 12/10/13 1108  PHART 7.225* 7.400  PCO2ART 71.4* 47.2*  PO2ART 70.5* 85.0   Liver Enzymes  Recent Labs Lab 12/05/13 0555 12/10/13 0915  AST 7 12  ALT 6 6  ALKPHOS 95 99  BILITOT 0.2* <0.2*  ALBUMIN 2.5* 2.9*   Cardiac Enzymes  Recent Labs Lab 12/07/13 0454  12/10/13 0915 12/10/13 1432 12/10/13 2053  TROPONINI  --   < > <0.30 <0.30 <0.30  PROBNP 4262.0*  --  2368.0*  --   --   < > = values in this interval not displayed. Glucose  Recent Labs Lab 12/09/13 2154 12/10/13 0639 12/10/13 0814 12/10/13 1316 12/10/13 1608 12/10/13 2102  GLUCAP 84 71 121* 95 135* 145*    Imaging Dg Chest Port 1 View  12/11/2013   CLINICAL DATA:  Evaluate edema.  EXAM: PORTABLE CHEST - 1 VIEW  COMPARISON:  12/10/2013.  FINDINGS: Decreased diffuse airspace disease, although still extensive and dense. Haziness of the lower chest suggesting pleural fluid. Unchanged cardiac enlargement. No evidence of pneumothorax.  IMPRESSION:  Improved, but still extensive, pulmonary edema.   Electronically Signed   By: Jorje Guild M.D.   On: 12/11/2013 06:27   Dg Chest Port 1 View  12/10/2013   CLINICAL DATA:  Shortness of breath.  EXAM: PORTABLE CHEST - 1 VIEW  COMPARISON:  12/06/2013  FINDINGS: Lungs are adequately inflated with slight worsening of diffuse bilateral airspace process likely infection and less likely edema. This process has worsened over the left mid to upper lung and right upper lobe. Cannot exclude a small mild pleural fluid in the lung bases. Stable cardiomegaly. Remainder the exam is unchanged.  IMPRESSION: Worsening bilateral diffuse airspace process likely infection and less likely edema. Cannot exclude a small amount of bilateral pleural fluid.  Stable cardiomegaly.   Electronically Signed   By: Marin Olp M.D.   On: 12/10/2013 09:04    ASSESSMENT / PLAN:  PULMONARY A: Acute Respiratory Failure - multi-factorial with decompensated CHF, untreated OSA, AFib with RVR Hypoxemia OSA / OHS P:   -bipap support PRN & QHS -f/u cxr -BP/Rate control -d/c steroids -Xopenex -low suspicion for pulmonary infection  -limit sedating medications with OSA & Obesity  CARDIOVASCULAR A:  HTN Diastolic CHF AFib - with RVR.  Not on anti-coagulation due to GIB hx.  Followed by Dr. Terrence Dupont.  Enzymes negative.  Returned to SR with BiPAP P:  -rate & BP control, amiodarone -ASA -Dr. Terrence Dupont consulted, appreciate input -lasix as renal fxn / BP permit -trend EKG / rhythm monitoring -norvasc, hydralazine, isordil scheduled -increase hydralazine to 100 mg TID 1/3 -PRN hydralazine  RENAL A:   CKD P:   -trend BMP -lasix as above  GASTROINTESTINAL A:   Morbid Obesity Hx of GIB / Hematoma of R Calf - required surgical I&D with graft for closure 11/14 P:   -reglan -heart healthy, low sodium diet  HEMATOLOGIC A:   Anemia P:  -continue Iron -trend CBC -pt refused SQ heparin, SCD's  INFECTIOUS A:    E-Coli UTI - treated. P:   -monitor fever curve / leukocytosis -abx / cultures as above -empiric zosyn/ vanc for HCAP, PCT reassuring, afebrile, no wbc.  D/c abx 1/3  ENDOCRINE A:   DM   P:   -SSI , lantus  NEUROLOGIC A:   No acute process P:   -supportive care  Noe Gens, NP-C [facility] Pgr: [phone] or [phone]    I have personally obtained a history, examined the patient, evaluated laboratory and imaging results, formulated the assessment and plan and placed orders.  CRITICAL CARE: The patient is critically ill with multiple organ systems failure and requires high complexity decision making for assessment and support, frequent evaluation and titration of therapies, application of advanced monitoring technologies and extensive interpretation of multiple databases. Critical Care  Time devoted to patient care services described in this note is __ minutes.   12/11/2013, 8:25 AM

## 2013-12-11 NOTE — Progress Notes (Signed)
RT Note: Spoke with pt about CPAP she states she would rather wear the Venti mask again tonight. I told her to let RN know if she changes her mind and we can adjust our machine again for her comfort. Rt will continue to monitor.

## 2013-12-12 ENCOUNTER — Inpatient Hospital Stay (HOSPITAL_COMMUNITY): Payer: Medicare HMO

## 2013-12-12 DIAGNOSIS — G4733 Obstructive sleep apnea (adult) (pediatric): Secondary | ICD-10-CM

## 2013-12-12 LAB — GLUCOSE, CAPILLARY
GLUCOSE-CAPILLARY: 107 mg/dL — AB (ref 70–99)
Glucose-Capillary: 102 mg/dL — ABNORMAL HIGH (ref 70–99)
Glucose-Capillary: 132 mg/dL — ABNORMAL HIGH (ref 70–99)
Glucose-Capillary: 113 mg/dL — ABNORMAL HIGH (ref 70–99)

## 2013-12-12 LAB — BASIC METABOLIC PANEL
BUN: 57 mg/dL — ABNORMAL HIGH (ref 6–23)
CO2: 28 mEq/L (ref 19–32)
CREATININE: 2.42 mg/dL — AB (ref 0.50–1.10)
Calcium: 8.4 mg/dL (ref 8.4–10.5)
Chloride: 105 mEq/L (ref 96–112)
GFR calc non Af Amer: 20 mL/min — ABNORMAL LOW (ref >90)
GFR, EST AFRICAN AMERICAN: 23 mL/min — AB (ref >90)
Glucose, Bld: 99 mg/dL (ref 70–99)
POTASSIUM: 4.4 meq/L (ref 3.7–5.3)
Sodium: 145 mEq/L (ref 137–147)

## 2013-12-12 LAB — PROCALCITONIN: PROCALCITONIN: 0.77 ng/mL

## 2013-12-12 LAB — CBC
HCT: 29 % — ABNORMAL LOW (ref 36.0–46.0)
Hemoglobin: 8.2 g/dL — ABNORMAL LOW (ref 12.0–15.0)
MCH: 26.4 pg (ref 26.0–34.0)
MCHC: 28.3 g/dL — AB (ref 30.0–36.0)
MCV: 93.2 fL (ref 78.0–100.0)
Platelets: 216 10*3/uL (ref 150–400)
RBC: 3.11 MIL/uL — ABNORMAL LOW (ref 3.87–5.11)
RDW: 19.2 % — AB (ref 11.5–15.5)
WBC: 7.9 10*3/uL (ref 4.0–10.5)

## 2013-12-12 MED ORDER — FERROUS SULFATE 325 (65 FE) MG PO TABS
325.0000 mg | ORAL_TABLET | Freq: Three times a day (TID) | ORAL | Status: DC
  Administered 2013-12-12 – 2013-12-16 (×14): 325 mg via ORAL
  Filled 2013-12-12 (×17): qty 1

## 2013-12-12 MED ORDER — HYDRALAZINE HCL 50 MG PO TABS
125.0000 mg | ORAL_TABLET | Freq: Three times a day (TID) | ORAL | Status: DC
  Administered 2013-12-12 – 2013-12-16 (×12): 125 mg via ORAL
  Filled 2013-12-12 (×15): qty 1

## 2013-12-12 MED ORDER — ALBUMIN HUMAN 25 % IV SOLN
12.5000 g | Freq: Once | INTRAVENOUS | Status: AC
  Administered 2013-12-12: 12.5 g via INTRAVENOUS
  Filled 2013-12-12: qty 50

## 2013-12-12 MED ORDER — FUROSEMIDE 10 MG/ML IJ SOLN
60.0000 mg | Freq: Every day | INTRAMUSCULAR | Status: DC
  Administered 2013-12-13 – 2013-12-15 (×3): 60 mg via INTRAVENOUS
  Filled 2013-12-12 (×3): qty 6

## 2013-12-12 NOTE — Progress Notes (Signed)
Attending:  I have seen and examined the patient with nurse practitioner/resident and agree with the note above.   She needs to follow up with Dr. Annamaria Boots regarding CPAP  Jillyn Hidden PCCM Pager: [phone] Cell: [phone] If no response, call [phone]

## 2013-12-12 NOTE — Progress Notes (Signed)
12/12/13 2358  BiPAP/CPAP/SIPAP  BiPAP/CPAP/SIPAP Pt Type Adult  Mask Type Full face mask  Mask Size Medium  Respiratory Rate 16 breaths/min  Oxygen Percent 0.32 %  BiPAP/CPAP/SIPAP BiPAP  Patient Home Equipment No  Auto Titrate Yes  Press High Alarm 20 cmH2O  Press Low Alarm 5 cmH2O  Placed patient on auto titrate max 20 min 7 with .32% 02 bleed in.  Patient wearing medium full face mask and tolerating well.

## 2013-12-12 NOTE — Progress Notes (Signed)
Name: [name] MRN: [id number] DOB: January 16, 1951    ADMISSION DATE:  12/03/2013 CONSULTATION DATE:  12/10/12  REFERRING MD :  Dr. Grandville Silos PRIMARY SERVICE: TRH-->PCCM  CHIEF COMPLAINT:  Respiratory Distress  BRIEF PATIENT DESCRIPTION: 63 y/o F admitted on 12/26 with progressive SOB post discharge on 12/15.  Presented w hypoxic resp fx, pulmonary edema and E-Coli UTI, admitted per TR[facility].  1/2 noted to have afib with RVR and decompensation from respiratory standpoint.  PCCM consulted for evaluation.   SIGNIFICANT EVENTS / STUDIES:  10/28/13 - Echo: nml EF, gr 1 diastolic dysfn 40/98/11 - d/c from [facility] with CHF exacerbation to SNF ................................................................................................... 12/26 - Re-admit with progressive dyspnea, chf, E-Coli UTI 01/02 - Tx to ICU w acute resp fx in setting of CHF, AFIB 01/03 - resp status improved, on VM, SR.   01/04 - continued improvement in dyspnea, on home O2 (2L [place])  LINES / TUBES: PIV  CULTURES: UC 12/26>>>E-Coli>>>sens rocephin BCx2 12/26>>>neg  ANTIBIOTICS: Fosfomycin 12/29>>>x1 Vanco 1/2>>>1/3 Azactam 1/2>>>1/3 Flagyl 1/2>>>1/3   SUBJECTIVE:  No acute events overnight.  Patient tolerated BiPap overnight, though notes that she does not have a CPAP/BiPap at home (has placed order for this, but has not yet arrived).  HR in 50's, sinus brady this morning.   VITAL SIGNS: Temp:  [97.6 F (36.4 C)-98.7 F (37.1 C)] 97.6 F (36.4 C) (01/04 0800) Pulse Rate:  [53-73] 62 (01/04 0800) Resp:  [15-27] 15 (01/04 0800) BP: (112-181)/(26-87) 157/46 mmHg (01/04 0800) SpO2:  [86 %-100 %] 94 % (01/04 0800) FiO2 (%):  [40 %] 40 % (01/03 1600) Weight:  [270 lb 8.1 oz (122.7 kg)] 270 lb 8.1 oz (122.7 kg) (01/04 0448)  INTAKE / OUTPUT: Intake/Output     01/03 0701 - 01/04 0700 01/04 0701 - 01/05 0700   P.O. 780    I.V. (mL/kg) 240 (2) 10 (0.1)   IV Piggyback     Total Intake(mL/kg) 1020 (8.3) 10 (0.1)    Urine (mL/kg/hr) 975 (0.3) 100 (0.7)   Total Output 975 100   Net +45 -90          PHYSICAL EXAMINATION: General: sitting up in bed, no apparent distress HEENT: PERRL, EOMI, oropharynx clear and non-erythematous  Neck: supple Lungs: normal work of respiration, distant breath sounds, possible crackles Heart: bradycardic, regular rhythm, no m/g/r Abdomen: soft, non-tender, non-distended, normal bowel sounds Extremities: 1+ bilateral edema Neurologic: alert & oriented X3, cranial nerves II-XII intact, strength grossly intact, sensation intact to light touch   LABS:  CBC  Recent Labs Lab 12/10/13 0915 12/11/13 0450 12/12/13 0545  WBC 8.8 6.1 7.9  HGB 8.9* 9.0* 8.2*  HCT 29.6* 30.4* 29.0*  PLT 201 235 216   BMET  Recent Labs Lab 12/10/13 0915 12/11/13 0450 12/12/13 0545  NA 145 146 145  K 4.9 5.7* 4.4  CL 104 106 105  CO2 25 29 28   BUN 44* 45* 57*  CREATININE 2.08* 2.00* 2.42*  GLUCOSE 180* 146* 99   Sepsis Markers  Recent Labs Lab 12/10/13 1200 12/11/13 0450 12/12/13 0545  PROCALCITON 0.56 0.79 0.77   ABG  Recent Labs Lab 12/10/13 0838 12/10/13 1108  PHART 7.225* 7.400  PCO2ART 71.4* 47.2*  PO2ART 70.5* 85.0   Liver Enzymes  Recent Labs Lab 12/10/13 0915  AST 12  ALT 6  ALKPHOS 99  BILITOT <0.2*  ALBUMIN 2.9*   Cardiac Enzymes  Recent Labs Lab 12/07/13 0454  12/10/13 0915 12/10/13 1432 12/10/13 2053  TROPONINI  --   < > <  0.30 <0.30 <0.30  PROBNP 4262.0*  --  2368.0*  --   --   < > = values in this interval not displayed. Glucose  Recent Labs Lab 12/10/13 2102 12/11/13 0746 12/11/13 1144 12/11/13 1609 12/11/13 2150 12/12/13 0755  GLUCAP 145* 141* 136* 137* 119* 102*    Imaging Dg Chest Port 1 View  12/11/2013   CLINICAL DATA:  Evaluate edema.  EXAM: PORTABLE CHEST - 1 VIEW  COMPARISON:  12/10/2013.  FINDINGS: Decreased diffuse airspace disease, although still extensive and dense. Haziness of the lower chest suggesting  pleural fluid. Unchanged cardiac enlargement. No evidence of pneumothorax.  IMPRESSION: Improved, but still extensive, pulmonary edema.   Electronically Signed   By: Jorje Guild M.D.   On: 12/11/2013 06:27   Dg Chest Port 1 View  12/10/2013   CLINICAL DATA:  Shortness of breath.  EXAM: PORTABLE CHEST - 1 VIEW  COMPARISON:  12/06/2013  FINDINGS: Lungs are adequately inflated with slight worsening of diffuse bilateral airspace process likely infection and less likely edema. This process has worsened over the left mid to upper lung and right upper lobe. Cannot exclude a small mild pleural fluid in the lung bases. Stable cardiomegaly. Remainder the exam is unchanged.  IMPRESSION: Worsening bilateral diffuse airspace process likely infection and less likely edema. Cannot exclude a small amount of bilateral pleural fluid.  Stable cardiomegaly.   Electronically Signed   By: Marin Olp M.D.   On: 12/10/2013 09:04    ASSESSMENT / PLAN:  PULMONARY A: Acute Respiratory Failure - multi-factorial with decompensated CHF, untreated OSA, AFib with RVR Hypoxemia OSA / OHS P:   -bipap support  QHS -BP/Rate control -Xopenex -low suspicion for pulmonary infection  -limit sedating medications with OSA & Obesity  CARDIOVASCULAR A:  HTN Diastolic CHF AFib - with RVR.  Not on anti-coagulation due to GIB hx.  Followed by Dr. Terrence Dupont.  Enzymes negative.  Returned to SR with BiPAP P:  -rate & BP control, amiodarone -ASA -Dr. Terrence Dupont consulted, appreciate input -trend EKG / rhythm monitoring -continue lasix, norvasc, isordil -increase hydralazine to 125 mg TID.  If BP remains elevated tomorrow, can increase isordil -PRN hydralazine  RENAL A:   CKD - Cr increased to 2.4 from 2.0 on 1/4, though her baseline is around 2.4-2.7. P:   -trend BMP -lasix as above  GASTROINTESTINAL A:   Morbid Obesity Hx of GIB / Hematoma of R Calf - required surgical I&D with graft for closure 11/14 P:    -reglan -heart healthy, low sodium diet  HEMATOLOGIC A:   Anemia P:  -continue Iron -trend CBC -pt refused SQ heparin, SCD's  INFECTIOUS A:   E-Coli UTI - treated. P:   -monitor fever curve / leukocytosis -cultures as above -D/c'ed abx 1/3  ENDOCRINE A:   DM   P:   -SSI , lantus  NEUROLOGIC A:   No acute process P:   -supportive care  Dispo - stable for transfer to telemetry today.  I have personally obtained a history, examined the patient, evaluated laboratory and imaging results, formulated the assessment and plan and placed orders.  CRITICAL CARE: The patient is critically ill with multiple organ systems failure and requires high complexity decision making for assessment and support, frequent evaluation and titration of therapies, application of advanced monitoring technologies and extensive interpretation of multiple databases. Critical Care Time devoted to patient care services described in this note is __ minutes.   Elnora Morrison, PGY3 Pgr. [phone] 12/12/2013, 8:16  AM    

## 2013-12-12 NOTE — Progress Notes (Signed)
PULMONARY / CRITICAL CARE MEDICINE  Name: [name] MRN: [id number] DOB: 10/13/1951    ADMISSION DATE:  12/03/2013 CONSULTATION DATE:  12/10/12  REFERRING MD :  [facility] PRIMARY SERVICE: TRH  CHIEF COMPLAINT:  Respiratory distress  BRIEF PATIENT DESCRIPTION: 63 yo admitted 12/26 with progressive dyspnea post discharge on 12/15.  Admitted to [facility] with hypoxic respiratory failure, pulmonary edema and E.Coli UTI.  On 1/2 noted to have AF-RVR associated with respiratory distress. PCCM consulted for evaluation.   SIGNIFICANT EVENTS / STUDIES:  11/20  TTE  >>> Normal EF, diastolic dysfunction 40/98  Discharged to [facility] 12/26  Readmitted with progressive dyspnea, CHF, E.Coli UTI 01/02  Transferred to ICU with acute respiratory failure in setting of CHF, AF/RVR  LINES / TUBES:  CULTURES: 12/26  Urine >>> E.COLI 12/26  Blood >>> neg  ANTIBIOTICS: Fosfomycin 12/29 x 1 Vancomycin 1/2 >>> 1/3 Azactam 1/2 >>> 1/3 Flagyl 1/2 >>> 1/3  INTERVAL HISTORY: Reports some dyspnea  VITAL SIGNS: Temp:  [98 F (36.7 C)-98.6 F (37 C)] 98 F (36.7 C) (01/05 0949) Pulse Rate:  [66-73] 73 (01/05 1942) Resp:  [20] 20 (01/05 0949) BP: (116-181)/(45-50) 163/47 mmHg (01/05 1942) SpO2:  [92 %-95 %] 95 % (01/05 1942) Weight:  [120.67 kg (266 lb 0.5 oz)] 120.67 kg (266 lb 0.5 oz) (01/05 0646)  INTAKE / OUTPUT: Intake/Output     01/05 0701 - 01/06 0700   P.O. 660   I.V. (mL/kg)    IV Piggyback    Total Intake(mL/kg) 660 (5.5)   Urine (mL/kg/hr) 2100 (1.3)   Stool 2 (0)   Total Output 2102   Net -1442       Stool Occurrence 2 x    PHYSICAL EXAMINATION: General: Resting in the chair HEENT: PERRL Neck: No JVD Lungs: Diminished breath sounds bilaterally, few bibasilar rales Heart: Regular, no murmurs Abdomen: Obese, soft Extremities: Symmetrical edema LE Neurologic: Awake, alert  LABS:  CBC  Recent Labs Lab 12/11/13 0450 12/12/13 0545 12/13/13 0522  WBC 6.1 7.9 7.5  HGB 9.0* 8.2*  8.3*  HCT 30.4* 29.0* 28.9*  PLT 235 216 206   BMET  Recent Labs Lab 12/11/13 0450 12/12/13 0545 12/13/13 0522  NA 146 145 145  K 5.7* 4.4 4.6  CL 106 105 105  CO2 29 28 30   BUN 45* 57* 56*  CREATININE 2.00* 2.42* 2.32*  GLUCOSE 146* 99 103*   Sepsis Markers  Recent Labs Lab 12/10/13 1200 12/11/13 0450 12/12/13 0545  PROCALCITON 0.56 0.79 0.77   ABG  Recent Labs Lab 12/10/13 0838 12/10/13 1108  PHART 7.225* 7.400  PCO2ART 71.4* 47.2*  PO2ART 70.5* 85.0   Liver Enzymes  Recent Labs Lab 12/10/13 0915 12/13/13 0522  AST 12 10  ALT 6 6  ALKPHOS 99 81  BILITOT <0.2* <0.2*  ALBUMIN 2.9* 2.8*   Cardiac Enzymes  Recent Labs Lab 12/07/13 0454  12/10/13 0915 12/10/13 1432 12/10/13 2053  TROPONINI  --   < > <0.30 <0.30 <0.30  PROBNP 4262.0*  --  2368.0*  --   --   < > = values in this interval not displayed. Glucose  Recent Labs Lab 12/12/13 2221 12/13/13 0556 12/13/13 0733 12/13/13 0949 12/13/13 1100 12/13/13 1645  GLUCAP 107* 100* 107* 122* 115* 102*   CXR:  None today  ASSESSMENT / PLAN:  PULMONARY A: Acute respiratory failure - resolved. OSA / OHS P:   Goal SpO2>92 Supplemental oxygen PRN BiPAP support  QHS Xopenex  CARDIOVASCULAR A:  HTN Acute on chronic diastolic congestive heart failure. AF/RVR ( Dr. Terrence Dupont ) - resolved. P:  Cardiology following ASA, Amioarone, Norvasc, Lipitor, Hydralazine, Isordil Hydralazine PRN Not a candidate for anticoagulation due to h/o GI hemorrhage  RENAL A:   CKD. P:   Trend BMP Lasix 60 daily  GASTROINTESTINAL A:   Morbid Obesity H/o GI hemorrhage. GERD. P:   Diet Protonix  HEMATOLOGIC A:   Anemia Hematoma of R Calf  Refused VTE Px ( Heparin, SCDs) P:  Trend CBC Iron sulfate  INFECTIOUS A:   E.Coli UTI - treated. P:   No intervention required  ENDOCRINE A:   DM   P:   SSI  NEUROLOGIC A:   No active issues P:   No intervention required  PCCM will sign  off.  I have personally obtained history, examined patient, evaluated and interpreted laboratory and imaging results, reviewed medical records, formulated assessment / plan and placed orders.  Doree Fudge, MD [facility] [place] Pager: [phone]  12/13/2013, 8:52 PM

## 2013-12-12 NOTE — Progress Notes (Signed)
Subjective:  Decreased urine output and increased creatinine in patient with hypoalbuminemia and diastolic heart failure  Objective:  Vital Signs in the last 24 hours: Temp:  [97.6 F (36.4 C)-98.7 F (37.1 C)] 97.6 F (36.4 C) (01/04 0800) Pulse Rate:  [53-73] 62 (01/04 0800) Cardiac Rhythm:  [-] Normal sinus rhythm (01/04 0800) Resp:  [15-27] 15 (01/04 0800) BP: (112-181)/(26-87) 157/46 mmHg (01/04 0800) SpO2:  [86 %-100 %] 94 % (01/04 0800) FiO2 (%):  [40 %] 40 % (01/03 1600) Weight:  [270 lb 8.1 oz (122.7 kg)] 270 lb 8.1 oz (122.7 kg) (01/04 0448)  Physical Exam: BP Readings from Last 1 Encounters:  12/12/13 157/46     Wt Readings from Last 1 Encounters:  12/12/13 270 lb 8.1 oz (122.7 kg)    Weight change: -14.1 oz (-0.4 kg)  HEENT: [place]/AT, Eyes-Brown, PERL, EOMI, Conjunctiva-Pale, Sclera-Non-icteric Neck: No JVD, No bruit, Trachea midline. Lungs:  Clearing, Bilateral. Cardiac:  Regular rhythm, normal S1 and S2, no S3.  Abdomen:  Soft, non-tender. Extremities:  Trace edema present. No cyanosis. No clubbing. CNS: AxOx3, Cranial nerves grossly intact, moves all 4 extremities. Right handed. Skin: Warm and dry.   Intake/Output from previous day: 01/03 0701 - 01/04 0700 In: 1020 [P.O.:780; I.V.:240] Out: 975 [Urine:975]    Lab Results: BMET    Component Value Date/Time   NA 145 12/12/2013 0545   K 4.4 12/12/2013 0545   CL 105 12/12/2013 0545   CO2 28 12/12/2013 0545   GLUCOSE 99 12/12/2013 0545   BUN 57* 12/12/2013 0545   CREATININE 2.42* 12/12/2013 0545   CREATININE 2.65* 07/01/2013 1108   CALCIUM 8.4 12/12/2013 0545   GFRNONAA 20* 12/12/2013 0545   GFRAA 23* 12/12/2013 0545   CBC    Component Value Date/Time   WBC 7.9 12/12/2013 0545   RBC 3.11* 12/12/2013 0545   RBC 3.33* 07/06/2013 0842   HGB 8.2* 12/12/2013 0545   HCT 29.0* 12/12/2013 0545   PLT 216 12/12/2013 0545   MCV 93.2 12/12/2013 0545   MCH 26.4 12/12/2013 0545   MCHC 28.3* 12/12/2013 0545   RDW 19.2* 12/12/2013 0545   LYMPHSABS 0.3* 12/11/2013 0450   MONOABS 0.1 12/11/2013 0450   EOSABS 0.0 12/11/2013 0450   BASOSABS 0.0 12/11/2013 0450   CARDIAC ENZYMES Lab Results  Component Value Date   CKTOTAL 34 12/07/2013   CKMB 1.4 12/07/2013   TROPONINI <0.30 12/10/2013    Scheduled Meds: . albumin human  12.5 g Intravenous Once  . allopurinol  100 mg Oral Daily  . ALPRAZolam  0.25 mg Oral QHS  . amiodarone  200 mg Oral BID  . amLODipine  10 mg Oral Daily  . antiseptic oral rinse  15 mL Mouth Rinse q12n4p  . aspirin  325 mg Oral Daily  . atorvastatin  20 mg Oral QHS  . diltiazem  10 mg Intravenous Once  . docusate sodium  100 mg Oral BID  . ferrous sulfate  325 mg Oral TID WC  . [START ON 12/13/2013] furosemide  60 mg Intravenous Daily  . hydrALAZINE  100 mg Oral TID  . insulin aspart  0-15 Units Subcutaneous TID WC  . isosorbide dinitrate  10 mg Oral BID  . levalbuterol  0.63 mg Nebulization TID  . metoCLOPramide  5 mg Oral TID  . pantoprazole  40 mg Oral Daily  . polyethylene glycol  17 g Oral BID   Continuous Infusions: . sodium chloride 10 mL/hr (12/11/13 0459)   PRN Meds:.acetaminophen,  hydrALAZINE, levalbuterol, ondansetron (ZOFRAN) IV, oxyCODONE-acetaminophen  Assessment/Plan: Diastolic left heart failure, acute  Status post paroxysmal A. 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  Add IV albumin to assist with renal function and diuresis. Increase activity. May transfer to step down or telemetry if bed is needed.   LOS: 9 days    Dixie Dials  MD  12/12/2013, 9:32 AM

## 2013-12-12 NOTE — Significant Event (Addendum)
Patient transferred from [facility] to 4E21 with continuous BiPAP order still in place.Dr Wynelle Cleveland who wrote the transfer orders contacted, instructed nurse to contact  critical care MD . Critical Care MD changed BiPAP order  to HS only. 4E charge nurse aware of patient status.

## 2013-12-13 ENCOUNTER — Telehealth: Payer: Self-pay | Admitting: *Deleted

## 2013-12-13 DIAGNOSIS — E669 Obesity, unspecified: Secondary | ICD-10-CM

## 2013-12-13 DIAGNOSIS — R0602 Shortness of breath: Secondary | ICD-10-CM

## 2013-12-13 LAB — CBC
HEMATOCRIT: 28.9 % — AB (ref 36.0–46.0)
Hemoglobin: 8.3 g/dL — ABNORMAL LOW (ref 12.0–15.0)
MCH: 26.6 pg (ref 26.0–34.0)
MCHC: 28.7 g/dL — AB (ref 30.0–36.0)
MCV: 92.6 fL (ref 78.0–100.0)
Platelets: 206 10*3/uL (ref 150–400)
RBC: 3.12 MIL/uL — AB (ref 3.87–5.11)
RDW: 19.3 % — ABNORMAL HIGH (ref 11.5–15.5)
WBC: 7.5 10*3/uL (ref 4.0–10.5)

## 2013-12-13 LAB — URINE MICROSCOPIC-ADD ON

## 2013-12-13 LAB — GLUCOSE, CAPILLARY
Glucose-Capillary: 100 mg/dL — ABNORMAL HIGH (ref 70–99)
Glucose-Capillary: 107 mg/dL — ABNORMAL HIGH (ref 70–99)
Glucose-Capillary: 122 mg/dL — ABNORMAL HIGH (ref 70–99)
Glucose-Capillary: 115 mg/dL — ABNORMAL HIGH (ref 70–99)
Glucose-Capillary: 102 mg/dL — ABNORMAL HIGH (ref 70–99)
Glucose-Capillary: 138 mg/dL — ABNORMAL HIGH (ref 70–99)

## 2013-12-13 LAB — URINALYSIS, ROUTINE W REFLEX MICROSCOPIC
Bilirubin Urine: NEGATIVE
GLUCOSE, UA: NEGATIVE mg/dL
HGB URINE DIPSTICK: NEGATIVE
Ketones, ur: NEGATIVE mg/dL
Nitrite: NEGATIVE
PROTEIN: NEGATIVE mg/dL
SPECIFIC GRAVITY, URINE: 1.007 (ref 1.005–1.030)
UROBILINOGEN UA: 0.2 mg/dL (ref 0.0–1.0)
pH: 5.5 (ref 5.0–8.0)

## 2013-12-13 LAB — COMPREHENSIVE METABOLIC PANEL
ALT: 6 U/L (ref 0–35)
AST: 10 U/L (ref 0–37)
Albumin: 2.8 g/dL — ABNORMAL LOW (ref 3.5–5.2)
Alkaline Phosphatase: 81 U/L (ref 39–117)
BUN: 56 mg/dL — AB (ref 6–23)
CO2: 30 meq/L (ref 19–32)
CREATININE: 2.32 mg/dL — AB (ref 0.50–1.10)
Calcium: 8.5 mg/dL (ref 8.4–10.5)
Chloride: 105 mEq/L (ref 96–112)
GFR, EST AFRICAN AMERICAN: 25 mL/min — AB (ref >90)
GFR, EST NON AFRICAN AMERICAN: 21 mL/min — AB (ref >90)
GLUCOSE: 103 mg/dL — AB (ref 70–99)
Potassium: 4.6 mEq/L (ref 3.7–5.3)
Sodium: 145 mEq/L (ref 137–147)
Total Bilirubin: <0.2 mg/dL — ABNORMAL LOW (ref 0.3–1.2)
Total Protein: 6.9 g/dL (ref 6.0–8.3)

## 2013-12-13 NOTE — Telephone Encounter (Signed)
lmtcb for ROV to be scheduled.

## 2013-12-13 NOTE — Telephone Encounter (Signed)
Message copied by Horatio Pel on Mon Dec 13, 2013 10:15 AM ------      Message from: Juanito Doom      Created: Sun Dec 12, 2013 10:52 AM       Hi All,            Please schedule a follow up appointment for this lady with Dr. Annamaria Boots.  OSA/Obesity hypoventilation syndrome.              Thanks,      B ------

## 2013-12-13 NOTE — Progress Notes (Signed)
Subjective:  Patient denies any chest pain, states breathing is improved. States feels better. No further palpitations  Objective:  Vital Signs in the last 24 hours: Temp:  [98 F (36.7 C)-98.6 F (37 C)] 98 F (36.7 C) (01/05 0949) Pulse Rate:  [66-72] 70 (01/05 1217) Resp:  [20] 20 (01/05 0949) BP: (116-181)/(45-50) 141/46 mmHg (01/05 1217) SpO2:  [92 %-94 %] 94 % (01/05 0949) Weight:  [120.67 kg (266 lb 0.5 oz)] 120.67 kg (266 lb 0.5 oz) (01/05 0646)  Intake/Output from previous day: 01/04 0701 - 01/05 0700 In: 920 [P.O.:780; I.V.:90; IV Piggyback:50] Out: 1200 [Urine:1200] Intake/Output from this shift: Total I/O In: 660 [P.O.:660] Out: 2102 [Urine:2100; Stool:2]  Physical Exam: Neck: no adenopathy, no carotid bruit, no JVD and supple, symmetrical, trachea midline Lungs: Decreased breath sound at bases with bibasilar rales Heart: regular rate and rhythm, S1, S2 normal and Soft systolic murmur and S3 gallop noted Abdomen: soft, non-tender; bowel sounds normal; no masses,  no organomegaly Extremities: No clubbing cyanosis or edema with chronic dermatosis changes noted  Lab Results:  Recent Labs  12/12/13 0545 12/13/13 0522  WBC 7.9 7.5  HGB 8.2* 8.3*  PLT 216 206    Recent Labs  12/12/13 0545 12/13/13 0522  NA 145 145  K 4.4 4.6  CL 105 105  CO2 28 30  GLUCOSE 99 103*  BUN 57* 56*  CREATININE 2.42* 2.32*    Recent Labs  12/10/13 2053  TROPONINI <0.30   Hepatic Function Panel  Recent Labs  12/13/13 0522  PROT 6.9  ALBUMIN 2.8*  AST 10  ALT 6  ALKPHOS 81  BILITOT <0.2*   No results found for this basename: CHOL,  in the last 72 hours No results found for this basename: PROTIME,  in the last 72 hours  Imaging: Imaging results have been reviewed and Dg Chest Port 1 View  12/12/2013   CLINICAL DATA:  Follow up edema.  EXAM: PORTABLE CHEST - 1 VIEW  COMPARISON:  12/11/2013 and 12/10/2013.  FINDINGS: 0533 hr. There has been further slight  improvement in the diffuse bilateral airspace opacities. Small bilateral pleural effusions are likely. There is stable cardiomegaly. No pneumothorax is identified. Diffuse prominence of the overlying soft tissues is again noted.  IMPRESSION: Further slight improvement in diffuse pulmonary opacities, consistent with mild improvement in pulmonary edema.   Electronically Signed   By: Camie Patience M.D.   On: 12/12/2013 08:19    Cardiac Studies:  Assessment/Plan:  Resolving decompensated diastolic heart failure  Status post paroxysmal A. 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

## 2013-12-13 NOTE — Progress Notes (Signed)
PROGRESS NOTE  [name] ZTI:[id number] DOB: November 02, 1951 DOA: 12/03/2013 PCP: Marlou Sa [name], MD  Assessment/Plan: Acute on chronic diastolic heart failure - appreciate cardiology input.  - continue Lasix 60 mg daily. Good UOP, net negative 14L since admission. Weight on admission 289 lbs, today 266 lbs. She was 264 lbs at her prior discharge. She states that she did not have daily weights at the nursing home, will specifically instruct that when she is discharged.  CAD - continue home medications History of CVA - Aspirin 325 Acute hypoxic respiratory failure - due to #1, improving.  A fib w/ RVR - in sinus continue Amiodarone.  Acute on chronic kidney disease - baseline Cr 2.5, CKD stage 4. At baseline.  History of GIB - no current evidence of bleeding. CBC stable.  Diarrhea - check C diff.  History of R calf hematoma s/p surgical I&D with closure - resolved, one staple left, will remove.  Anemia - of chronic disease, on Iron suplements Gout - continue Allopurinol 100 mg daily HTN - Norvasc 10 mg daily, hydralazine 125 TID,  Isordil 10 OSA - encourage CPAP HLD - Lipitor 20 mg daily GERD - Protonix E coli UTI - s/p fosfomycin DM - SSI  Diet: low sodium Fluids: none DVT Prophylaxis: refusing heparin, SCDs  Code Status: Full Family Communication: husband bedside  Disposition Plan: SNF when ready  Consultants:  Cardiology   Procedures:  None   SIGNIFICANT EVENTS / STUDIES:  10/28/13 - Echo: nml EF, gr 1 diastolic dysfn  82/50/53 - d/c from [facility] with CHF exacerbation to SNF  ...................................................................................................  12/26 - Re-admit with progressive dyspnea, chf, E-Coli UTI  01/02 - Tx to ICU w acute resp fx in setting of CHF, AFIB  01/03 - resp status improved, on VM, SR.  01/04 - continued improvement in dyspnea, on home O2 (2L [place])   Antibiotics Fosfomycin 12/29>>>x1  Vanco 1/2>>>1/3  Azactam 1/2>>>1/3    Flagyl 1/2>>>1/3  UC 12/26>>>E-Coli>>>sens rocephin  BCx2 12/26>>>neg  HPI/Subjective: Feeling short of breath with minimal ambulation.   Objective: Filed Vitals:   12/13/13 0949 12/13/13 0956 12/13/13 0957 12/13/13 1217  BP: 175/50 175/50 175/50 141/46  Pulse: 72   70  Temp: 98 F (36.7 C)     TempSrc: Oral     Resp: 20     Height:      Weight:      SpO2: 94%       Intake/Output Summary (Last 24 hours) at 12/13/13 1559 Last data filed at 12/13/13 1209  Gross per 24 hour  Intake    550 ml  Output   1802 ml  Net  -1252 ml   Filed Weights   12/12/13 0448 12/12/13 1714 12/13/13 0646  Weight: 122.7 kg (270 lb 8.1 oz) 120.657 kg (266 lb) 120.67 kg (266 lb 0.5 oz)   Exam:  General: somewhat uncomfortable, dyspneic, at the side of the bed  Cardiovascular: regular rate and rhythm, without MRG  Respiratory: poor exam due to body habitus but no appreciable wheezing  Abdomen: obese, not tender to palpation, positive bowel sounds  MSK: chronic venous changes and psoriatic plaques present. Skin graft RLE.   Neuro: non focal  Data Reviewed: Basic Metabolic Panel:  Recent Labs Lab 12/10/13 0600 12/10/13 0915 12/11/13 0450 12/12/13 0545 12/13/13 0522  NA 145 145 146 145 145  K 4.7 4.9 5.7* 4.4 4.6  CL 106 104 106 105 105  CO2 28 25 29 28 30   GLUCOSE 57*  180* 146* 99 103*  BUN 46* 44* 45* 57* 56*  CREATININE 2.06* 2.08* 2.00* 2.42* 2.32*  CALCIUM 8.8 8.8 9.1 8.4 8.5   Liver Function Tests:  Recent Labs Lab 12/10/13 0915 12/13/13 0522  AST 12 10  ALT 6 6  ALKPHOS 99 81  BILITOT <0.2* <0.2*  PROT 7.7 6.9  ALBUMIN 2.9* 2.8*   CBC:  Recent Labs Lab 12/08/13 0425 12/10/13 0915 12/11/13 0450 12/12/13 0545 12/13/13 0522  WBC 4.7 8.8 6.1 7.9 7.5  NEUTROABS  --   --  5.7  --   --   HGB 7.1* 8.9* 9.0* 8.2* 8.3*  HCT 23.9* 29.6* 30.4* 29.0* 28.9*  MCV 89.8 89.4 90.7 93.2 92.6  PLT 175 201 235 216 206   Cardiac Enzymes:  Recent Labs Lab  12/07/13 1008 12/10/13 0915 12/10/13 1432 12/10/13 2053  CKTOTAL 34  --   --   --   CKMB 1.4  --   --   --   TROPONINI <0.30 <0.30 <0.30 <0.30   BNP (last 3 results)  Recent Labs  12/03/13 1329 12/07/13 0454 12/10/13 0915  PROBNP 1911.0* 4262.0* 2368.0*   CBG:  Recent Labs Lab 12/12/13 1631 12/12/13 2221 12/13/13 0556 12/13/13 0733 12/13/13 0949  GLUCAP 113* 107* 100* 107* 122*    Recent Results (from the past 240 hour(s))  URINE CULTURE     Status: None   Collection Time    12/03/13  6:32 PM      Result Value Range Status   Specimen Description URINE, CATHETERIZED   Final   Special Requests NONE   Final   Culture  Setup Time     Final   Value: 12/04/2013 01:50     Performed at [place]     Final   Value: >=100,000 COLONIES/ML     Performed at [facility]   Culture     Final   Value: ESCHERICHIA COLI     LACTOBACILLUS SPECIES     Note: Standardized susceptibility testing for this organism is not available.     Performed at [facility]   Report Status 12/06/2013 FINAL   Final   Organism ID, Bacteria ESCHERICHIA COLI   Final     Studies: Dg Chest Port 1 View  12/12/2013   CLINICAL DATA:  Follow up edema.  EXAM: PORTABLE CHEST - 1 VIEW  COMPARISON:  12/11/2013 and 12/10/2013.  FINDINGS: 0533 hr. There has been further slight improvement in the diffuse bilateral airspace opacities. Small bilateral pleural effusions are likely. There is stable cardiomegaly. No pneumothorax is identified. Diffuse prominence of the overlying soft tissues is again noted.  IMPRESSION: Further slight improvement in diffuse pulmonary opacities, consistent with mild improvement in pulmonary edema.   Electronically Signed   By: Camie Patience M.D.   On: 12/12/2013 08:19    Scheduled Meds: . allopurinol  100 mg Oral Daily  . ALPRAZolam  0.25 mg Oral QHS  . amiodarone  200 mg Oral BID  . amLODipine  10 mg Oral Daily  . antiseptic oral rinse  15  mL Mouth Rinse q12n4p  . aspirin  325 mg Oral Daily  . atorvastatin  20 mg Oral QHS  . diltiazem  10 mg Intravenous Once  . docusate sodium  100 mg Oral BID  . ferrous sulfate  325 mg Oral TID WC  . furosemide  60 mg Intravenous Daily  . hydrALAZINE  125 mg Oral TID  .  insulin aspart  0-15 Units Subcutaneous TID WC  . isosorbide dinitrate  10 mg Oral BID  . levalbuterol  0.63 mg Nebulization TID  . metoCLOPramide  5 mg Oral TID  . pantoprazole  40 mg Oral Daily  . polyethylene glycol  17 g Oral BID   Continuous Infusions: . sodium chloride Stopped (12/12/13 1622)    Principal Problem:   Acute respiratory failure Active Problems:   OBSTRUCTIVE SLEEP APNEA   HYPERTENSION   Acute combined systolic and diastolic heart failure   DM (diabetes mellitus), type 2 with renal complications   CKD (chronic kidney disease), stage IV   Obesity   OSA (obstructive sleep apnea)   A-fib   Acute respiratory failure with hypercapnia   HCAP (healthcare-associated pneumonia)   Hypernatremia  Time spent: [place], MD [facility] Pager [phone]. If 7 PM - 7 AM, please contact night-coverage at [email or web address], password [facility] 12/13/2013, 3:59 PM  LOS: 10 days

## 2013-12-13 NOTE — Progress Notes (Signed)
Staple x 1 removed from right lower leg at former healed skin graft site.

## 2013-12-13 NOTE — Evaluation (Signed)
Occupational Therapy Evaluation and Discharge Patient Details Name: [name] MRN: [id number] DOB: 05/05/1951 Today's Date: 12/13/2013 Time: 6063-0160 OT Time Calculation (min): 23 min  OT Assessment / Plan / Recommendation History of present illness Pt with multiple medical conditions admitted for SOB. Was at SNF PTA.   Clinical Impression   This 63 yo female admitted with above presents to acute OT with decreased activity tolerance, increased pain in left knee with bending too much; obesity, decreased mobility all affecting pt's ability to do more to take care of herself. Will benefit from continued OT at SNF, acute OT will sign off.    OT Assessment  Patient needs continued OT Services    Follow Up Recommendations  SNF;Supervision/Assistance - 24 hour    Barriers to Discharge Decreased caregiver support    Equipment Recommendations   (TBD at next venue)          Precautions / Restrictions Precautions Precautions: Fall Precaution Comments: decreased activity tolerance. O2 dep Restrictions Weight Bearing Restrictions: No   Pertinent Vitals/Pain LLE when it bent too much upon sitting; repositioned. Catheter has been irritating her; repositioned    ADL  Eating/Feeding: Independent Where Assessed - Eating/Feeding: Bed level Grooming: Set up Where Assessed - Grooming: Unsupported sitting Upper Body Bathing: Moderate assistance Where Assessed - Upper Body Bathing: Unsupported sitting Lower Body Bathing: +1 Total assistance Where Assessed - Lower Body Bathing: Supported sit to stand Upper Body Dressing: Maximal assistance Where Assessed - Upper Body Dressing: Unsupported sitting Lower Body Dressing: +1 Total assistance Where Assessed - Lower Body Dressing: Supported sit to Lobbyist: Moderate assistance Toilet Transfer Method: Sit to stand Toilet Transfer Equipment:  (Bed>turn to recliner on her left) Toileting - Clothing Manipulation and Hygiene: +1  Total assistance Where Assessed - Toileting Clothing Manipulation and Hygiene: Standing Transfers/Ambulation Related to ADLs: Min A sit<>stand    OT Diagnosis: Generalized weakness;Acute pain  OT Problem List: Decreased strength;Decreased range of motion;Decreased activity tolerance;Impaired balance (sitting and/or standing);Decreased knowledge of use of DME or AE;Cardiopulmonary status limiting activity;Pain  Acute Rehab OT Goals Patient Stated Goal: to get stronger so she can go home  Visit Information  Last OT Received On: 12/13/13 Assistance Needed: +2 (for ambulation) History of Present Illness: Pt with multiple medical conditions admitted for SOB. Was at SNF PTA.       Prior [place] expects to be discharged to:: Skilled nursing facility Additional Comments: wants to return home eventually Prior Function Level of Independence: Needs assistance Gait / Transfers Assistance Needed: uses RW.  Walking very short distances at [facility] before admission. ADL's / Homemaking Assistance Needed: SNF staff assists Comments: pt reports she needs to get stronger and more independent before she goes home with husband and 2 sons. Communication Communication: No difficulties Dominant Hand: Right         Vision/Perception Vision - History Baseline Vision: Wears glasses all the time Patient Visual Report: No change from baseline   Cognition  Cognition Arousal/Alertness: Awake/alert Behavior During Therapy: WFL for tasks assessed/performed Overall Cognitive Status: Within Functional Limits for tasks assessed    Extremity/Trunk Assessment Upper Extremity Assessment Upper Extremity Assessment: Generalized weakness Lower Extremity Assessment Lower Extremity Assessment: LLE deficits/detail LLE Deficits / Details: Cannot lift it on her own in bed, increased effort to make it move with ambulation, needs to advance it  forward before sitting due  to increased pain when it bends     Mobility Bed  Mobility Bed Mobility: Sit to Supine;Sit to Sidelying Right Supine to Sit: 3: Mod assist;HOB elevated;With rails Sitting - Scoot to Edge of Bed: 3: Mod assist;With rail Transfers Transfers: Sit to Stand;Stand to Sit Sit to Stand: 4: Min assist;From bed;With upper extremity assist Stand to Sit: 4: Min assist;With upper extremity assist;With armrests;To chair/3-in-1           End of Session OT - End of Session Equipment Utilized During Treatment: Rolling walker Activity Tolerance: Patient limited by fatigue Patient left: in chair;with call bell/phone within reach (O2 at 2 liters) Nurse Communication: Mobility status    Almon Register [id number] 12/13/2013, 1:47 PM

## 2013-12-14 LAB — GLUCOSE, CAPILLARY
GLUCOSE-CAPILLARY: 121 mg/dL — AB (ref 70–99)
GLUCOSE-CAPILLARY: 138 mg/dL — AB (ref 70–99)
GLUCOSE-CAPILLARY: 125 mg/dL — AB (ref 70–99)
Glucose-Capillary: 135 mg/dL — ABNORMAL HIGH (ref 70–99)

## 2013-12-14 LAB — CBC
HEMATOCRIT: 30.5 % — AB (ref 36.0–46.0)
HEMOGLOBIN: 8.9 g/dL — AB (ref 12.0–15.0)
MCH: 26.9 pg (ref 26.0–34.0)
MCHC: 29.2 g/dL — ABNORMAL LOW (ref 30.0–36.0)
MCV: 92.1 fL (ref 78.0–100.0)
Platelets: 198 10*3/uL (ref 150–400)
RBC: 3.31 MIL/uL — ABNORMAL LOW (ref 3.87–5.11)
RDW: 19.5 % — AB (ref 11.5–15.5)
WBC: 7.7 10*3/uL (ref 4.0–10.5)

## 2013-12-14 LAB — BASIC METABOLIC PANEL
BUN: 48 mg/dL — AB (ref 6–23)
CO2: 32 meq/L (ref 19–32)
CREATININE: 2.07 mg/dL — AB (ref 0.50–1.10)
Calcium: 8.9 mg/dL (ref 8.4–10.5)
Chloride: 101 mEq/L (ref 96–112)
GFR calc Af Amer: 28 mL/min — ABNORMAL LOW (ref >90)
GFR calc non Af Amer: 24 mL/min — ABNORMAL LOW (ref >90)
Glucose, Bld: 122 mg/dL — ABNORMAL HIGH (ref 70–99)
POTASSIUM: 4.3 meq/L (ref 3.7–5.3)
Sodium: 144 mEq/L (ref 137–147)

## 2013-12-14 NOTE — Progress Notes (Signed)
PT Cancellation Note  Patient Details Name: [name] MRN: [id number] DOB: 01-19-1951   Cancelled Treatment:    Reason Eval/Treat Not Completed: Patient declined, no reason specified ("I don't feel like it today.")   Faustino Congress K 12/14/2013, 10:55 AM  Laureen Abrahams, PT, DPT [phone]

## 2013-12-14 NOTE — Progress Notes (Signed)
PROGRESS NOTE  [name] YTK:[id number] DOB: [birth year] DOA: 12/03/2013 PCP: Kevan Ny, MD  History 63 yo admitted 12/26 with progressive dyspnea post discharge on 12/15. Admitted to [facility] with hypoxic respiratory failure, pulmonary edema and E.Coli UTI. On 1/2 noted to have AF-RVR associated with respiratory distress. She was in ICU until 1/5 when she was transferred to floor.   Assessment/Plan: Acute on chronic diastolic heart failure - appreciate cardiology input.  - continue Lasix 60 mg daily. Good UOP, net negative 14L since admission. Weight on admission 289 lbs, today 266 lbs. She was 264 lbs at her prior discharge. She states that she did not have daily weights at the nursing home, will specifically instruct that when she is discharged.  - will switched to oral Lasix tomorrow  CAD - continue home medications History of CVA - Aspirin 325 Acute hypoxic respiratory failure - due to #1, improving.  A fib w/ RVR - in sinus continue Amiodarone.  Acute on chronic kidney disease - baseline Cr 2.5, CKD stage 4. At baseline.  History of GIB - no current evidence of bleeding. CBC stable.  Diarrhea - check C diff History of R calf hematoma s/p surgical I&D with closure - resolved, one staple left, will remove.  Anemia - of chronic disease, on Iron suplements Gout - continue Allopurinol 100 mg daily HTN - Norvasc 10 mg daily, hydralazine 125 TID,  Isordil 10 OSA - encourage CPAP HLD - Lipitor 20 mg daily GERD - Protonix E coli UTI - s/p fosfomycin DM - SSI  Diet: low sodium Fluids: none DVT Prophylaxis: refusing heparin, SCDs  Code Status: Full Family Communication: husband bedside  Disposition Plan: SNF when ready, likely 1/7  Consultants:  Cardiology   Procedures:  None   SIGNIFICANT EVENTS / STUDIES:  10/28/13 - Echo: nml EF, gr 1 diastolic dysfn  55/73/22 - d/c from [facility] with CHF exacerbation to SNF    ...................................................................................................  12/26 - Re-admit with progressive dyspnea, chf, E-Coli UTI  01/02 - Tx to ICU w acute resp fx in setting of CHF, AFIB  01/03 - resp status improved, on VM, SR.  01/04 - continued improvement in dyspnea, on home O2 (2L [place])   Antibiotics Fosfomycin 12/29>>>x1  Vanco 1/2>>>1/3  Azactam 1/2>>>1/3  Flagyl 1/2>>>1/3  UC 12/26>>>E-Coli>>>sens rocephin  BCx2 12/26>>>neg  HPI/Subjective: Feeling short of breath with minimal ambulation.   Objective: Filed Vitals:   12/14/13 0500 12/14/13 0824 12/14/13 0914 12/14/13 1501  BP: 172/98  165/90 121/37  Pulse: 74  78 63  Temp: 98.2 F (36.8 C)  98.4 F (36.9 C) 98.1 F (36.7 C)  TempSrc: Oral  Oral Oral  Resp: 22  20 20   Height:      Weight: 119.6 kg (263 lb 10.7 oz)     SpO2: 98% 98% 97% 95%    Intake/Output Summary (Last 24 hours) at 12/14/13 1656 Last data filed at 12/14/13 0939  Gross per 24 hour  Intake    720 ml  Output   2225 ml  Net  -1505 ml   Filed Weights   12/12/13 1714 12/13/13 0646 12/14/13 0500  Weight: 120.657 kg (266 lb) 120.67 kg (266 lb 0.5 oz) 119.6 kg (263 lb 10.7 oz)   Exam:  General: somewhat uncomfortable, dyspneic, at the side of the bed  Cardiovascular: regular rate and rhythm, without MRG  Respiratory: poor exam due to body habitus but no appreciable wheezing  Abdomen: obese, not tender to palpation, positive bowel sounds  MSK: chronic venous changes and psoriatic plaques present. Skin graft RLE.   Neuro: non focal  Data Reviewed: Basic Metabolic Panel:  Recent Labs Lab 12/10/13 0915 12/11/13 0450 12/12/13 0545 12/13/13 0522 12/14/13 0630  NA 145 146 145 145 144  K 4.9 5.7* 4.4 4.6 4.3  CL 104 106 105 105 101  CO2 25 29 28 30  32  GLUCOSE 180* 146* 99 103* 122*  BUN 44* 45* 57* 56* 48*  CREATININE 2.08* 2.00* 2.42* 2.32* 2.07*  CALCIUM 8.8 9.1 8.4 8.5 8.9   Liver Function  Tests:  Recent Labs Lab 12/10/13 0915 12/13/13 0522  AST 12 10  ALT 6 6  ALKPHOS 99 81  BILITOT <0.2* <0.2*  PROT 7.7 6.9  ALBUMIN 2.9* 2.8*   CBC:  Recent Labs Lab 12/10/13 0915 12/11/13 0450 12/12/13 0545 12/13/13 0522 12/14/13 0630  WBC 8.8 6.1 7.9 7.5 7.7  NEUTROABS  --  5.7  --   --   --   HGB 8.9* 9.0* 8.2* 8.3* 8.9*  HCT 29.6* 30.4* 29.0* 28.9* 30.5*  MCV 89.4 90.7 93.2 92.6 92.1  PLT 201 235 216 206 198   Cardiac Enzymes:  Recent Labs Lab 12/10/13 0915 12/10/13 1432 12/10/13 2053  TROPONINI <0.30 <0.30 <0.30   BNP (last 3 results)  Recent Labs  12/03/13 1329 12/07/13 0454 12/10/13 0915  PROBNP 1911.0* 4262.0* 2368.0*   CBG:  Recent Labs Lab 12/13/13 1645 12/13/13 2051 12/14/13 0659 12/14/13 1039 12/14/13 1535  GLUCAP 102* 138* 121* 138* 125*    No results found for this or any previous visit (from the past 240 hour(s)).   Studies: No results found.  Scheduled Meds: . allopurinol  100 mg Oral Daily  . ALPRAZolam  0.25 mg Oral QHS  . amiodarone  200 mg Oral BID  . amLODipine  10 mg Oral Daily  . antiseptic oral rinse  15 mL Mouth Rinse q12n4p  . aspirin  325 mg Oral Daily  . atorvastatin  20 mg Oral QHS  . diltiazem  10 mg Intravenous Once  . docusate sodium  100 mg Oral BID  . ferrous sulfate  325 mg Oral TID WC  . furosemide  60 mg Intravenous Daily  . hydrALAZINE  125 mg Oral TID  . insulin aspart  0-15 Units Subcutaneous TID WC  . isosorbide dinitrate  10 mg Oral BID  . levalbuterol  0.63 mg Nebulization TID  . metoCLOPramide  5 mg Oral TID  . pantoprazole  40 mg Oral Daily  . polyethylene glycol  17 g Oral BID   Continuous Infusions: . sodium chloride Stopped (12/12/13 1622)    Principal Problem:   Acute respiratory failure Active Problems:   OBSTRUCTIVE SLEEP APNEA   HYPERTENSION   Acute combined systolic and diastolic heart failure   DM (diabetes mellitus), type 2 with renal complications   CKD (chronic kidney  disease), stage IV   Obesity   OSA (obstructive sleep apnea)   A-fib   Acute respiratory failure with hypercapnia   HCAP (healthcare-associated pneumonia)   Hypernatremia  Time spent: [place], MD [facility] Pager [phone]. If 7 PM - 7 AM, please contact night-coverage at [email or web address], password [facility] 12/14/2013, 4:56 PM  LOS: 11 days

## 2013-12-14 NOTE — Progress Notes (Signed)
Subjective:  Patient complains of vague abdominal discomfort had extensive GI workup in the past. Denies any chest pain palpitations or shortness of breath. No further palpitations  Objective:  Vital Signs in the last 24 hours: Temp:  [97.8 F (36.6 C)-98.4 F (36.9 C)] 98.4 F (36.9 C) (01/06 0914) Pulse Rate:  [70-78] 78 (01/06 0914) Resp:  [20-22] 20 (01/06 0914) BP: (141-172)/(46-98) 165/90 mmHg (01/06 0914) SpO2:  [95 %-98 %] 97 % (01/06 0914) Weight:  [119.6 kg (263 lb 10.7 oz)] 119.6 kg (263 lb 10.7 oz) (01/06 0500)  Intake/Output from previous day: 01/05 0701 - 01/06 0700 In: 900 [P.O.:900] Out: 3002 [Urine:3000; Stool:2] Intake/Output from this shift: Total I/O In: 240 [P.O.:240] Out: 225 [Urine:225]  Physical Exam: Neck: no adenopathy, no carotid bruit, no JVD and supple, symmetrical, trachea midline Lungs: Decrease breath sound at bases faint rales Heart: regular rate and rhythm, S1, S2 normal and Soft systolic murmur and S3 gallop noted Abdomen: soft, non-tender; bowel sounds normal; no masses,  no organomegaly Extremities: No clubbing cyanosis or edema chronic dermatosis changes noted  Lab Results:  Recent Labs  12/13/13 0522 12/14/13 0630  WBC 7.5 7.7  HGB 8.3* 8.9*  PLT 206 198    Recent Labs  12/13/13 0522 12/14/13 0630  NA 145 144  K 4.6 4.3  CL 105 101  CO2 30 32  GLUCOSE 103* 122*  BUN 56* 48*  CREATININE 2.32* 2.07*   No results found for this basename: TROPONINI, CK, MB,  in the last 72 hours Hepatic Function Panel  Recent Labs  12/13/13 0522  PROT 6.9  ALBUMIN 2.8*  AST 10  ALT 6  ALKPHOS 81  BILITOT <0.2*   No results found for this basename: CHOL,  in the last 72 hours No results found for this basename: PROTIME,  in the last 72 hours  Imaging: Imaging results have been reviewed and No results found.  Cardiac Studies:  Assessment/Plan:  Compensated at diastolic heart failure  Status post paroxysmal A. 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

## 2013-12-14 NOTE — Progress Notes (Signed)
Placed patient on CPAP via full face mask, auto titrate settings with 3 lpm O2 bleed in.  Patient tolerating well at this time. RN aware.

## 2013-12-14 NOTE — Progress Notes (Signed)
Foley found on bed.  No meatal bleeding noted.  Meatus is large.

## 2013-12-15 LAB — CBC
HEMATOCRIT: 28.7 % — AB (ref 36.0–46.0)
Hemoglobin: 8.2 g/dL — ABNORMAL LOW (ref 12.0–15.0)
MCH: 27 pg (ref 26.0–34.0)
MCHC: 28.6 g/dL — AB (ref 30.0–36.0)
MCV: 94.4 fL (ref 78.0–100.0)
Platelets: 201 10*3/uL (ref 150–400)
RBC: 3.04 MIL/uL — ABNORMAL LOW (ref 3.87–5.11)
RDW: 19.4 % — ABNORMAL HIGH (ref 11.5–15.5)
WBC: 6.4 10*3/uL (ref 4.0–10.5)

## 2013-12-15 LAB — BASIC METABOLIC PANEL
BUN: 50 mg/dL — AB (ref 6–23)
CHLORIDE: 103 meq/L (ref 96–112)
CO2: 31 mEq/L (ref 19–32)
Calcium: 8.7 mg/dL (ref 8.4–10.5)
Creatinine, Ser: 2.49 mg/dL — ABNORMAL HIGH (ref 0.50–1.10)
GFR, EST AFRICAN AMERICAN: 23 mL/min — AB (ref >90)
GFR, EST NON AFRICAN AMERICAN: 19 mL/min — AB (ref >90)
Glucose, Bld: 103 mg/dL — ABNORMAL HIGH (ref 70–99)
POTASSIUM: 4.6 meq/L (ref 3.7–5.3)
Sodium: 144 mEq/L (ref 137–147)

## 2013-12-15 LAB — GLUCOSE, CAPILLARY
GLUCOSE-CAPILLARY: 97 mg/dL (ref 70–99)
GLUCOSE-CAPILLARY: 118 mg/dL — AB (ref 70–99)
Glucose-Capillary: 101 mg/dL — ABNORMAL HIGH (ref 70–99)
Glucose-Capillary: 125 mg/dL — ABNORMAL HIGH (ref 70–99)

## 2013-12-15 LAB — CLOSTRIDIUM DIFFICILE BY PCR: CDIFFPCR: NEGATIVE

## 2013-12-15 MED ORDER — OXYCODONE-ACETAMINOPHEN 5-325 MG PO TABS: 2.0000 | ORAL_TABLET | Freq: Four times a day (QID) | ORAL | Status: DC | PRN

## 2013-12-15 MED ORDER — LEVALBUTEROL HCL 0.63 MG/3ML IN NEBU: 0.6300 mg | INHALATION_SOLUTION | Freq: Three times a day (TID) | RESPIRATORY_TRACT | Status: DC

## 2013-12-15 MED ORDER — AMIODARONE HCL 200 MG PO TABS: 200.0000 mg | ORAL_TABLET | Freq: Every day | ORAL | Status: DC

## 2013-12-15 MED ORDER — LEVOFLOXACIN 250 MG PO TABS: 250.0000 mg | ORAL_TABLET | Freq: Every day | ORAL | Status: DC

## 2013-12-15 MED ORDER — ALPRAZOLAM 0.25 MG PO TABS: 0.2500 mg | ORAL_TABLET | Freq: Every day | ORAL | Status: DC

## 2013-12-15 MED ORDER — HYDRALAZINE HCL 25 MG PO TABS: 125.0000 mg | ORAL_TABLET | Freq: Three times a day (TID) | ORAL | Status: DC

## 2013-12-15 MED ORDER — FUROSEMIDE 80 MG PO TABS
80.0000 mg | ORAL_TABLET | Freq: Every day | ORAL | Status: DC
  Administered 2013-12-15 – 2013-12-16 (×2): 80 mg via ORAL
  Filled 2013-12-15 (×2): qty 1

## 2013-12-15 NOTE — Progress Notes (Signed)
Chaplain responded to request for a visit.  Chaplain observed pt sitting in a chair upon his entrance.  Pt was very conversational.  Chaplain extended support through his caring presence, empathic listening and through praying with pt.     12/15/13 1500  Clinical Encounter Type  Visited With Patient  Visit Type Spiritual support  Spiritual Encounters  Spiritual Needs Emotional;Prayer    Estelle June, chaplain, pager [phone]

## 2013-12-15 NOTE — Progress Notes (Signed)
Patient alert and oriented x4 throughout shift; vital signs stable.  Patient's husband at bedside for most of morning.  Plan was to discharge to SNF today but due to insurance issues, discharge pushed to tomorrow.  Patient is aware of update to plan of care.  Will continue to monitor.

## 2013-12-15 NOTE — Progress Notes (Signed)
Subjective:  Patient denies any chest pain or shortness of breath up in chair states feels much better and ready to be discharged  Objective:  Vital Signs in the last 24 hours: Temp:  [97 F (36.1 C)-98.1 F (36.7 C)] 97.8 F (36.6 C) (01/07 0601) Pulse Rate:  [56-63] 62 (01/07 1021) Resp:  [18-20] 18 (01/07 0601) BP: (121-151)/(37-50) 151/44 mmHg (01/07 1021) SpO2:  [93 %-97 %] 97 % (01/07 0930) Weight:  [119.8 kg (264 lb 1.8 oz)] 119.8 kg (264 lb 1.8 oz) (01/07 0601)  Intake/Output from previous day: 01/06 0701 - 01/07 0700 In: 480 [P.O.:480] Out: 225 [Urine:225] Intake/Output from this shift:    Physical Exam: Neck: no adenopathy, no carotid bruit, no JVD and supple, symmetrical, trachea midline Lungs: decreased breath sound at bases air entry improved Heart: regular rate and rhythm, S1, S2 normal and soft systolic murmur noted Abdomen: soft, non-tender; bowel sounds normal; no masses,  no organomegaly Extremities: no clubbing cyanosis with chronic dermatosis changes noted  Lab Results:  Recent Labs  12/14/13 0630 12/15/13 0325  WBC 7.7 6.4  HGB 8.9* 8.2*  PLT 198 201    Recent Labs  12/14/13 0630 12/15/13 0325  NA 144 144  K 4.3 4.6  CL 101 103  CO2 32 31  GLUCOSE 122* 103*  BUN 48* 50*  CREATININE 2.07* 2.49*   No results found for this basename: TROPONINI, CK, MB,  in the last 72 hours Hepatic Function Panel  Recent Labs  12/13/13 0522  PROT 6.9  ALBUMIN 2.8*  AST 10  ALT 6  ALKPHOS 81  BILITOT <0.2*   No results found for this basename: CHOL,  in the last 72 hours No results found for this basename: PROTIME,  in the last 72 hours  Imaging: Imaging results have been reviewed and No results found.  Cardiac Studies:  Assessment/Plan:  Compensated at diastolic heart failure  Status post paroxysmal A. 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  followup with me in one week  LOS: 12 days    [name] 12/15/2013, 12:33 PM

## 2013-12-15 NOTE — Discharge Summary (Addendum)
Physician Discharge Summary  [name] [id number] DOB: 1951-02-18 DOA: 12/03/2013  PCP: Kevan Ny, MD  Admit date: 12/03/2013 Discharge date: 12/15/2013  Time spent: 55 minutes  Recommendations for Outpatient Follow-up:  1. Need continued diuresis, discharge weight = 262 pounds. Admission weight = 289 2. Recommend complete metabolic panel in 3 days 3. Continue Levaquin 250 mg daily for suppressive therapy of prosthetic knee infection until seen by ID physician Dr. Tommy Medal 01/06[birth year]63 4. Recommend outpatient reassessment with cardiologist in one week, appointment set up next Wednesday 12/22/13 , 1345 ,Dr. Hedwig Morton patient that she will need a higher dose of Lasix 80 mg bid 5. Recommend fluid restriction 1500 cc  6. Needs daily weights with the same scale. If patient gains over 2 pounds in 24 hour period, increase Lasix to 3 times a day 7. Continue BiPAP-may benefit from pulmonary consult for secondary pulmonary arterial hypertension-currently on nitrates and hasn't had a blocker, likely has multifactorial dyspnea with pulmonary hypertension group 3, might benefit from advance therapies 8. Patient deemed high risk for GI bleeding-has history of multiple tubular adenomas 08/16/13 therefore is on aspirin for anticoagulation-to be readdressed as an outpatient 9. Consider outpatient nephrology input as she has stage IV CKD 10. Restart her prior to admission sliding scale insulin  Discharge Diagnoses:  Principal Problem:   Acute respiratory failure Active Problems:   OBSTRUCTIVE SLEEP APNEA   HYPERTENSION   Acute combined systolic and diastolic heart failure   DM (diabetes mellitus), type 2 with renal complications   CKD (chronic kidney disease), stage IV   Obesity   OSA (obstructive sleep apnea)   A-fib   Acute respiratory failure with hypercapnia   HCAP (healthcare-associated pneumonia)   Hypernatremia   Discharge Condition: Stable  Diet recommendation: Heart healthy  low-salt 1500 cc  Filed Weights   12/13/13 0646 12/14/13 0500 12/15/13 0601  Weight: 120.67 kg (266 lb 0.5 oz) 119.6 kg (263 lb 10.7 oz) 119.8 kg (264 lb 1.8 oz)    History of present illness:  63 year old obese female with history of acute mild systolic and diastolic failure, type 2 diabetes mellitus, CKD stage IV, paroxysmal A. fib (not on anticoagulation due to GI bleed and leg hematoma status post surgery), chronic anemia, obstructive sleep apnea/OHS noncompliant with CPAP, former smoker + COPD, hypertension, recent hospitalization for respiratory failure associated with CHF 12/03/13 Review of chart notes she has been admitted at least 8-10 times in the past 6 months since discharge 08/2013 where she went to skilled facility. She presented tachypneic tachycardic An ABG done in the ED showed a pH of 7.2, PCO2 of 54 and PO2 of 167. Patient O2 sats were stable on BiPAP. Blood work showed leukocytosis with WBC of 14.7, hemoglobin of 10.2, platelets of 216. Sodium of 145, potassium 4.2, chloride 112, BUN of 46, creatinine of 2.1 and glucose of 211. BNP was 1900. Chest x-ray done showed bilateral pulmonary opacity suggestive likely of pulmonary edema with possible bilateral pneumonia.  Patient given nitro paste and 40 mg IV Lasix Was placed on BiPAP on admission and kept in stepped-down unit-because of atrial fibrillation and RVR-subsequently is transferred to ICU service 12/10/13 for acute respiratory failure, kept on BiPAP and then transferred out 12/13/12 During this admission as well she be given IV albumin 4 hypoalbuminemia which is likely nutritional See below for full details    Hospital Course:  Multifactorial dyspnea-Acute on chronic diastolic heart failure + secondary pulmonary arterial hypertension presumably from OHS/OSA - appreciate cardiology input.  -  continue Lasix at higher dose of 80 mg once daily Good UOP, net negative 16L since admission. Weight on admission 289 lbs, today 262 lbs.  She was 262 lbs at her prior discharge.  - See above recommendations CAD - continue home medications.  Continue amlodipine 10 daily, hydralazine 50 3 times a day  On this admission to 125 3 times a day, Isordil 10 twice a day, Lopressor 12.5 daily-she will also continue aspirin 325 daily as below History of CVA - Aspirin 325  Acute hypoxic respiratory failure - due to #1, improved to baseline on discharge. -Recommend as an outpatient getting an appointment with pulmonology to discuss potential pulmonary hypertension -She may benefit from PFTs to determine if there is an obstructive component or whether this is all restrictive given her habitus A fib w/ RVR - transiently in rapid ventricular rate , converted with Amiodarone-dosage changed to 200 twice a day. This will need to be readjusted in the next week to week and a half -Cardiology has an appointment with her on 12/22/13 to address this Acute on chronic kidney disease - baseline Cr 2.5, CKD stage 4. At baseline.  History of GIB - no current evidence of bleeding. CBC stable -Likely has anemia of chronic disease given multiple issues Diarrhea - start having stool nausea vomiting. No stool in the past 48 hours. C. difficile was negative History of R calf hematoma s/p surgical I&D with closure - resolved, one staple left, which was removed  Anemia - of chronic disease, on Iron suplements  Gout - continue Allopurinol 100 mg daily  HTN - C. medications as above OSA - encourage CPAP-patient has consistently refused this in the past. 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 Lucianne Lei Dam's note-I did receive a message from and patient is supposed to be long-term until seen by him on 01/06/63   Consultants:  Cardiology  Transfer to ICU and then reassume care from them on 12/14/12  Procedures:  None    SIGNIFICANT EVENTS / STUDIES:   10/28/13 - Echo: nml EF 16-10%, gr 1 diastolic dysfn , concentric hypertrophy Prior echo 02/11/2013 showed grade 2 diastolic dysfunction with PA peak of 42 mm mercury 11/22/13 - d/c from [facility] with CHF exacerbation to SNF  12/26 - Re-admit with progressive dyspnea, chf, E-Coli UTI  01/02 - Tx to ICU w acute resp fx in setting of CHF, AFIB  01/03 - resp status improved, on VM, SR.  01/04 - continued improvement in dyspnea, on home O2 (2L [place])   Antibiotics Fosfomycin 12/29>>>x1  Vanco 1/2>>>1/3  Azactam 1/2>>>1/3  Flagyl 1/2>>>1/3  UC 12/26>>>E-Coli>>>sens rocephin  BCx2 12/26>>>neg  Discharge Exam: Filed Vitals:   12/15/13 1021  BP: 151/44  Pulse: 62  Temp:   Resp:     General: Alert oriented no apparent distress Cardiovascular: S1-S2 no murmur rub or gallop, sinus bradycardia, telemetry rate = 50. No murmurs rubs or gallops Respiratory: Clinically clear Lower extremities show significant varrucous areas She is unable to lift her left  leg off the bed and states that she is weak in her left knee, secondary to significant reconstructive surgery performed 08/2013 she has sensation however  Discharge Instructions  Discharge Orders   Future Appointments Provider Department Dept Phone   01/06/2014 1:45 PM Truman Hayward, MD [facility] [phone]   Future Orders Complete By Expires   ([place]) Call MD:  Anytime you have any of the following symptoms: 1) 3 pound weight gain in 24 hours or 5 pounds in 1 week 2) shortness of breath, with or without a dry hacking cough 3) swelling in the hands, feet or stomach 4) if you  have to sleep on extra pillows at night in order to breathe.  As directed    Diet - low sodium heart healthy  As directed    Discharge instructions  As directed    Comments:     Needs daily weights Needs extra dose of oral Lasix if gains more than 2-3 pounds in 48-72 hours Need fluid restriction 1500 cc to start with Cut out salt from diet Continue BiPAP at facility Needs labs in about one week including complete metabolic panel, CBC, TSH as on chronic amiodarone Needs close followup with cardiologist-recommend one week   Increase activity slowly  As directed        Medication List         albuterol 108 (90 BASE) MCG/ACT inhaler  Commonly known as:  PROVENTIL HFA;VENTOLIN HFA  Inhale 2 puffs into the lungs every 6 (six) hours as needed for wheezing or shortness of breath.     albuterol (2.5 MG/3ML) 0.083% nebulizer solution  Commonly known as:  PROVENTIL  Take 2.5 mg by nebulization every 6 (six) hours as needed for wheezing or shortness of breath.     allopurinol 100 MG tablet  Commonly known as:  ZYLOPRIM  Take 100 mg by mouth daily. Patient takes at 0800am daily     ALPRAZolam 0.25 MG tablet  Commonly known as:  XANAX  Take 1 tablet (0.25 mg total) by mouth at bedtime.     amiodarone 200 MG tablet  Commonly known as:  PACERONE  Take 1 tablet (200 mg total) by mouth daily. Patient takes at 0800am daily     amLODipine 10 MG tablet  Commonly known as:  NORVASC  Take 10 mg by mouth daily. Patient takes at 0800am daily     aspirin 325 MG tablet  Take 325 mg by mouth daily.     atorvastatin 20 MG tablet  Commonly known as:  LIPITOR  Take 20 mg by mouth at bedtime. Patient takes at 2100     docusate sodium 100 MG capsule  Commonly known as:  COLACE  Take 100 mg by mouth 2 (two) times daily.     ferrous sulfate 325 (65 FE) MG tablet  Take 325 mg by mouth 3 (three) times daily with meals. Patient takes at 0800, 1200, and 1700     furosemide 80 MG tablet  Commonly  known as:  LASIX  Take 1 tablet (80 mg total) by mouth 2 (two) times daily.     hydrALAZINE 25 MG tablet  Commonly known as:  APRESOLINE  Take 5 tablets (125 mg total) by mouth 3 (three) times daily.     insulin glargine 100 UNIT/ML injection  Commonly known as:  LANTUS  Inject 15 Units into the skin 2 (two) times daily.     isosorbide dinitrate 10  MG tablet  Commonly known as:  ISORDIL  Take 1 tablet (10 mg total) by mouth 2 (two) times daily.     levalbuterol 0.63 MG/3ML nebulizer solution  Commonly known as:  XOPENEX  Take 3 mLs (0.63 mg total) by nebulization 3 (three) times daily.     levofloxacin 250 MG tablet  Commonly known as:  LEVAQUIN  Take 1 tablet (250 mg total) by mouth daily.     metoCLOPramide 5 MG tablet  Commonly known as:  REGLAN  Take 5 mg by mouth 3 (three) times daily.     metoprolol tartrate 25 MG tablet  Commonly known as:  LOPRESSOR  Take 12.5 mg by mouth 2 (two) times daily.     omeprazole 20 MG capsule  Commonly known as:  PRILOSEC  Take 20 mg by mouth daily.     oxyCODONE-acetaminophen 5-325 MG per tablet  Commonly known as:  PERCOCET/ROXICET  Take 2 tablets by mouth every 6 (six) hours as needed for severe pain.     pantoprazole 40 MG tablet  Commonly known as:  PROTONIX  Take 40 mg by mouth daily.     polyethylene glycol packet  Commonly known as:  MIRALAX / GLYCOLAX  Take 17 g by mouth 2 (two) times daily.     potassium chloride 20 MEQ packet  Commonly known as:  KLOR-CON  Take 20 mEq by mouth daily.     traZODone 50 MG tablet  Commonly known as:  DESYREL  Take 25 mg by mouth at bedtime.     tuberculin 5 UNIT/0.1ML injection  Inject 0.1 mLs into the skin once.       Allergies  Allergen Reactions  . Daptomycin Other (See Comments)    Elevated CPK  . Morphine And Related Nausea And Vomiting  . Cephalexin Rash  . Tramadol Nausea And Vomiting  . Celecoxib Other (See Comments)    Unknown, can use furosemide without issue    . Codeine Other (See Comments)    unknown  . Fluoxetine Hcl Other (See Comments)    unknown  . Latex Other (See Comments)  . Ofloxacin Other (See Comments)    unknown  . Penicillins Other (See Comments)    unknown  . Rofecoxib Other (See Comments)    unknown  . Sulfonamide Derivatives Other (See Comments)    unknown       Follow-up Information   Follow up with Clent Demark, MD On 12/22/2013. (Wednesday, @ 1:45 PM)    Specialty:  Cardiology   Contact information:   [address] [place] [address] [phone]        The results of significant diagnostics from this hospitalization (including imaging, microbiology, ancillary and laboratory) are listed below for reference.    Significant Diagnostic Studies: Dg Chest Port 1 View  12/12/2013   CLINICAL DATA:  Follow up edema.  EXAM: PORTABLE CHEST - 1 VIEW  COMPARISON:  12/11/2013 and 12/10/2013.  FINDINGS: 0533 hr. There has been further slight improvement in the diffuse bilateral airspace opacities. Small bilateral pleural effusions are likely. There is stable cardiomegaly. No pneumothorax is identified. Diffuse prominence of the overlying soft tissues is again noted.  IMPRESSION: Further slight improvement in diffuse pulmonary opacities, consistent with mild improvement in pulmonary edema.   Electronically Signed   By: Camie Patience M.D.   On: 12/12/2013 08:19   Dg Chest Port 1 View  12/11/2013   CLINICAL DATA:  Evaluate edema.  EXAM: PORTABLE CHEST -  1 VIEW  COMPARISON:  12/10/2013.  FINDINGS: Decreased diffuse airspace disease, although still extensive and dense. Haziness of the lower chest suggesting pleural fluid. Unchanged cardiac enlargement. No evidence of pneumothorax.  IMPRESSION: Improved, but still extensive, pulmonary edema.   Electronically Signed   By: Jorje Guild M.D.   On: 12/11/2013 06:27   Dg Chest Port 1 View  12/10/2013   CLINICAL DATA:  Shortness of breath.  EXAM: PORTABLE CHEST - 1  VIEW  COMPARISON:  12/06/2013  FINDINGS: Lungs are adequately inflated with slight worsening of diffuse bilateral airspace process likely infection and less likely edema. This process has worsened over the left mid to upper lung and right upper lobe. Cannot exclude a small mild pleural fluid in the lung bases. Stable cardiomegaly. Remainder the exam is unchanged.  IMPRESSION: Worsening bilateral diffuse airspace process likely infection and less likely edema. Cannot exclude a small amount of bilateral pleural fluid.  Stable cardiomegaly.   Electronically Signed   By: Marin Olp M.D.   On: 12/10/2013 09:04   Dg Chest Port 1 View  12/06/2013   CLINICAL DATA:  Infiltrates.  EXAM: PORTABLE CHEST - 1 VIEW  COMPARISON:  12/03/2013.  FINDINGS: Cardiomegaly.  Diffuse asymmetric airspace disease without significant change. This may represent result of pulmonary edema or infectious infiltrate. Clinical correlation and follow-up until clearance recommended.  Right-sided pleural effusion suspected.  No gross pneumothorax.  IMPRESSION: Similar appearance of diffuse asymmetric airspace disease which may represent pulmonary edema or infectious infiltrate.  Small right-sided pleural effusion suspected.  Cardiomegaly.   Electronically Signed   By: Chauncey Cruel M.D.   On: 12/06/2013 07:07   Dg Chest Portable 1 View  12/03/2013   CLINICAL DATA:  63 year old female with respiratory distress. Shortness of breath. Initial encounter.  EXAM: PORTABLE CHEST - 1 VIEW  COMPARISON:  11/17/2013 and earlier.  FINDINGS: Seated AP portable view at 1033 hrs. Mixed interstitial and airspace opacity in both lungs, increased in confluence since earlier this month. Chronic cardiomegaly. Other mediastinal contours are within normal limits. No pneumothorax. No large pleural effusion.  IMPRESSION: More confluent bilateral pulmonary opacity, mixed interstitial and airspace appearance. Favor acute/worsening pulmonary edema over bilateral  pneumonia.   Electronically Signed   By: Lars Pinks M.D.   On: 12/03/2013 10:43   Dg Chest Port 1 View  11/17/2013   CLINICAL DATA:  Shortness of breath.  EXAM: PORTABLE CHEST - 1 VIEW  COMPARISON:  Chest x-rays dated 11/10/2013 and 01/25/2012  FINDINGS: There is chronic cardiomegaly. Bilateral infiltrates have improved since the prior study. Density at the right base suggests a small effusion. Pulmonary vascularity remains slightly prominent. No acute osseous abnormality.  IMPRESSION: Improving bilateral pulmonary infiltrates. Decreased small right effusion.   Electronically Signed   By: Rozetta Nunnery M.D.   On: 11/17/2013 11:17   Dg Abd Portable 1v  11/18/2013   CLINICAL DATA:  Abdominal pain.  EXAM: PORTABLE ABDOMEN - 1 VIEW  COMPARISON:  12/30/2011 abdominal films.  11/17/2013 chest x-ray.  FINDINGS: Asymmetric airspace disease and cardiomegaly once again noted.  Nonspecific bowel gas pattern without plain film evidence of bowel obstruction.  The possibility of free intraperitoneal air cannot be assessed on a supine view.  Postsurgical changes lower lumbar spine.  IMPRESSION: Asymmetric airspace disease and cardiomegaly once again noted.  Nonspecific bowel gas pattern without plain film evidence of bowel obstruction.  The possibility of free intraperitoneal air cannot be assessed on a supine view.   Electronically Signed  By: Chauncey Cruel M.D.   On: 11/18/2013 13:22    Microbiology: No results found for this or any previous visit (from the past 240 hour(s)).   Labs: Basic Metabolic Panel:  Recent Labs Lab 12/11/13 0450 12/12/13 0545 12/13/13 0522 12/14/13 0630 12/15/13 0325  NA 146 145 145 144 144  K 5.7* 4.4 4.6 4.3 4.6  CL 106 105 105 101 103  CO2 29 28 30  32 31  GLUCOSE 146* 99 103* 122* 103*  BUN 45* 57* 56* 48* 50*  CREATININE 2.00* 2.42* 2.32* 2.07* 2.49*  CALCIUM 9.1 8.4 8.5 8.9 8.7   Liver Function Tests:  Recent Labs Lab 12/10/13 0915 12/13/13 0522  AST 12 10  ALT  6 6  ALKPHOS 99 81  BILITOT <0.2* <0.2*  PROT 7.7 6.9  ALBUMIN 2.9* 2.8*   No results found for this basename: LIPASE, AMYLASE,  in the last 168 hours No results found for this basename: AMMONIA,  in the last 168 hours CBC:  Recent Labs Lab 12/11/13 0450 12/12/13 0545 12/13/13 0522 12/14/13 0630 12/15/13 0325  WBC 6.1 7.9 7.5 7.7 6.4  NEUTROABS 5.7  --   --   --   --   HGB 9.0* 8.2* 8.3* 8.9* 8.2*  HCT 30.4* 29.0* 28.9* 30.5* 28.7*  MCV 90.7 93.2 92.6 92.1 94.4  PLT 235 216 206 198 201   Cardiac Enzymes:  Recent Labs Lab 12/10/13 0915 12/10/13 1432 12/10/13 2053  TROPONINI <0.30 <0.30 <0.30   BNP: BNP (last 3 results)  Recent Labs  12/03/13 1329 12/07/13 0454 12/10/13 0915  PROBNP 1911.0* 4262.0* 2368.0*   CBG:  Recent Labs Lab 12/14/13 0659 12/14/13 1039 12/14/13 1535 12/14/13 2105 12/15/13 0604  GLUCAP 121* 138* 125* 135* 101*       Signed:  Nita Sells  [facility] 12/15/2013, 10:35 AM

## 2013-12-16 LAB — GLUCOSE, CAPILLARY
GLUCOSE-CAPILLARY: 103 mg/dL — AB (ref 70–99)
Glucose-Capillary: 106 mg/dL — ABNORMAL HIGH (ref 70–99)

## 2013-12-16 MED ORDER — FUROSEMIDE 80 MG PO TABS: 80.0000 mg | ORAL_TABLET | Freq: Two times a day (BID) | ORAL | Status: DC

## 2013-12-16 NOTE — Progress Notes (Signed)
Subjective:  Patient denies any chest pain or shortness of breath up in chair states feels much better and ready to be discharged  Objective:  Vital Signs in the last 24 hours: Temp:  [97.2 F (36.2 C)-98 F (36.7 C)] 98 F (36.7 C) (01/08 0648) Pulse Rate:  [57-64] 60 (01/08 1036) Resp:  [18] 18 (01/08 0648) BP: (129-152)/(39-59) 152/59 mmHg (01/08 1036) SpO2:  [94 %-100 %] 98 % (01/08 1036) Weight:  [119.2 kg (262 lb 12.6 oz)] 119.2 kg (262 lb 12.6 oz) (01/08 0648)  Intake/Output from previous day: 01/07 0701 - 01/08 0700 In: 720 [P.O.:720] Out: -  Intake/Output from this shift: Total I/O In: 360 [P.O.:360] Out: -   Physical Exam: Neck: no adenopathy, no carotid bruit, no JVD and supple, symmetrical, trachea midline Lungs: decreased breath sound at bases air entry improved Heart: regular rate and rhythm, S1, S2 normal and soft systolic murmur noted Abdomen: soft, non-tender; bowel sounds normal; no masses,  no organomegaly Extremities: no clubbing cyanosis with chronic dermatosis changes noted  Lab Results:  Recent Labs  12/14/13 0630 12/15/13 0325  WBC 7.7 6.4  HGB 8.9* 8.2*  PLT 198 201    Recent Labs  12/14/13 0630 12/15/13 0325  NA 144 144  K 4.3 4.6  CL 101 103  CO2 32 31  GLUCOSE 122* 103*  BUN 48* 50*  CREATININE 2.07* 2.49*   No results found for this basename: TROPONINI, CK, MB,  in the last 72 hours Hepatic Function Panel No results found for this basename: PROT, ALBUMIN, AST, ALT, ALKPHOS, BILITOT, BILIDIR, IBILI,  in the last 72 hours No results found for this basename: CHOL,  in the last 72 hours No results found for this basename: PROTIME,  in the last 72 hours  Imaging: Imaging results have been reviewed and No results found.  Cardiac Studies:  Assessment/Plan:  Compensated at diastolic heart failure  Status post paroxysmal A. 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  Agree with increasing Lasix to 80 mg twice daily  followup with me in one week   LOS: 13 days    Ronnika Collett N 12/16/2013, 10:52 AM

## 2013-12-16 NOTE — Progress Notes (Signed)
DC back to SNF delayed yesterday due to CSW not receiving Craig Staggers for d/c.  Ok per MD for d/c today and CSW spoke to [place], Admissions at [facility]- they have received Craig Staggers and patient is ok to return. DC packet complete- patient and nursing notified. Patient does not want her husband called by this CSW to notify him of her d/c.  [place] signing off.  Lorie Phenix. [place], [place]

## 2013-12-16 NOTE — Progress Notes (Signed)
Per MD- patient is medically ready for d/c back to [facility] but we do not have authorization from [facility].  This process was initiated by facility early this morning but Josem Kaufmann is pending. DC delayed. Notified patient's nurse Raquel Sarna - she will notify patient and MD.  Lorie Phenix. [place], [place]

## 2013-12-16 NOTE — Progress Notes (Signed)
Placed patient on CPAP via [facility], auto titrate settings with 2 lpm O2 bleed in.  Patient tolerating well at this time.

## 2013-12-16 NOTE — Significant Event (Signed)
C. updated discharge summary from this morning

## 2013-12-16 NOTE — Progress Notes (Signed)
Patient discharged to [facility].  Report called to [place].  Patient's vital signs stable prior to discharge.  IV removed; IV site clean, dry, and intact.  General discharge instructions discussed with patient (diet, need for PT, etc.) prior to discharge.  Patient voiced understanding of education and instructions.

## 2013-12-19 NOTE — Clinical Social Work Placement (Addendum)
    Clinical Social Work Department CLINICAL SOCIAL WORK PLACEMENT NOTE 12/19/2013  Patient:  MACKIE, GOON  Account Number:  [id number] Admit date:  12/03/2013  Clinical Social Worker:  Kemper Durie, [place]  Date/time:  12/08/2013 01:00 PM  Clinical Social Work is seeking post-discharge placement for this patient at the following level of care:   [place]   (*CSW will update this form in Epic as items are completed)   12/07/2013  Patient/family provided with [place] Department of Clinical Social Work's list of facilities offering this level of care within the geographic area requested by the patient (or if unable, by the patient's family).  12/07/2013  Patient/family informed of their freedom to choose among providers that offer the needed level of care, that participate in Medicare, Medicaid or managed care program needed by the patient, have an available bed and are willing to accept the patient.  12/07/2013  Patient/family informed of [facility]' ownership interest in [facility], as well as of the fact that they are under no obligation to receive care at this facility.  PASARR submitted to EDS on  PASARR number received from [place] on   FL2 transmitted to all facilities in geographic area requested by pt/family on  12/07/2013 FL2 transmitted to all facilities within larger geographic area on   Patient informed that his/her managed care company has contracts with or will negotiate with  certain facilities, including the following:   NA- has exisiting PASARR     Patient/family informed of bed offers received:   Patient chooses bed at  Physician recommends and patient chooses bed at    Patient to be transferred to [facility] on  12/16/2013 Patient to be transferred to facility by Ambulance  Corey Harold)  The following physician request were entered in Epic:   Additional Comments:

## 2013-12-20 NOTE — Telephone Encounter (Signed)
Spoke to pt's husband. States that she is currently living at [facility] and [facility] [facility]. We will need to call this facility and speak with the charge nurse and schedule this appointment.

## 2013-12-21 NOTE — Telephone Encounter (Signed)
Can she see me or TP in next 2 weeks?

## 2013-12-21 NOTE — Telephone Encounter (Signed)
i have called the facility----[facility] and was placed on hold for almost 10 mins.  Will try back in the  Morning to schedule appt with TP---[facility] had nothing open.

## 2013-12-21 NOTE — Telephone Encounter (Signed)
[facility] Please advise how soon you would like to see the patient; recent Hospital stay for Acute Resp. Failure with heart troubles (12-03-13) and last seen in our office 05-02-11. Thanks.

## 2013-12-21 NOTE — Telephone Encounter (Signed)
Katie and CY please advise.  Thank you.

## 2014-01-03 ENCOUNTER — Ambulatory Visit: Payer: Medicare HMO | Admitting: Infectious Disease

## 2014-01-06 ENCOUNTER — Ambulatory Visit: Payer: Medicare HMO | Admitting: Infectious Disease

## 2014-01-07 NOTE — Telephone Encounter (Signed)
Not that I see; please schedule patient in as soon as possible with TP as [facility] does not have any openings. Thanks.

## 2014-01-07 NOTE — Telephone Encounter (Signed)
Have we gotten this patient an appointment with [facility] yet?

## 2014-01-07 NOTE — Telephone Encounter (Signed)
I spoke with the pt and she states she has to wait on transportation and cannot set an appt at this time. She states she hopes to know something next week and will call us at that time for an appt. I offered to set an appt now and pt can make arrangements for it or call to change but pt refused.  Bing, CMA

## 2014-01-25 ENCOUNTER — Inpatient Hospital Stay (HOSPITAL_COMMUNITY)
Admission: EM | Admit: 2014-01-25 | Discharge: 2014-01-28 | DRG: 292 | Disposition: A | Discharge status: Skilled Nursing Facility | Payer: Medicare HMO | Attending: Internal Medicine | Admitting: Internal Medicine

## 2014-01-25 ENCOUNTER — Emergency Department (HOSPITAL_COMMUNITY): Payer: Medicare HMO

## 2014-01-25 ENCOUNTER — Encounter (HOSPITAL_COMMUNITY): Payer: Self-pay | Admitting: Emergency Medicine

## 2014-01-25 DIAGNOSIS — F3289 Other specified depressive episodes: Secondary | ICD-10-CM | POA: Diagnosis present

## 2014-01-25 DIAGNOSIS — D638 Anemia in other chronic diseases classified elsewhere: Secondary | ICD-10-CM

## 2014-01-25 DIAGNOSIS — D649 Anemia, unspecified: Secondary | ICD-10-CM | POA: Diagnosis present

## 2014-01-25 DIAGNOSIS — I480 Paroxysmal atrial fibrillation: Secondary | ICD-10-CM

## 2014-01-25 DIAGNOSIS — I129 Hypertensive chronic kidney disease with stage 1 through stage 4 chronic kidney disease, or unspecified chronic kidney disease: Secondary | ICD-10-CM | POA: Diagnosis present

## 2014-01-25 DIAGNOSIS — Z8673 Personal history of transient ischemic attack (TIA), and cerebral infarction without residual deficits: Secondary | ICD-10-CM

## 2014-01-25 DIAGNOSIS — IMO0002 Reserved for concepts with insufficient information to code with codable children: Secondary | ICD-10-CM

## 2014-01-25 DIAGNOSIS — J45909 Unspecified asthma, uncomplicated: Secondary | ICD-10-CM | POA: Diagnosis present

## 2014-01-25 DIAGNOSIS — K5900 Constipation, unspecified: Secondary | ICD-10-CM

## 2014-01-25 DIAGNOSIS — S81801A Unspecified open wound, right lower leg, initial encounter: Secondary | ICD-10-CM

## 2014-01-25 DIAGNOSIS — K909 Intestinal malabsorption, unspecified: Secondary | ICD-10-CM | POA: Diagnosis present

## 2014-01-25 DIAGNOSIS — N184 Chronic kidney disease, stage 4 (severe): Secondary | ICD-10-CM

## 2014-01-25 DIAGNOSIS — E870 Hyperosmolality and hypernatremia: Secondary | ICD-10-CM

## 2014-01-25 DIAGNOSIS — R110 Nausea: Secondary | ICD-10-CM

## 2014-01-25 DIAGNOSIS — G4733 Obstructive sleep apnea (adult) (pediatric): Secondary | ICD-10-CM | POA: Diagnosis present

## 2014-01-25 DIAGNOSIS — I6789 Other cerebrovascular disease: Secondary | ICD-10-CM

## 2014-01-25 DIAGNOSIS — Z9981 Dependence on supplemental oxygen: Secondary | ICD-10-CM

## 2014-01-25 DIAGNOSIS — I509 Heart failure, unspecified: Secondary | ICD-10-CM

## 2014-01-25 DIAGNOSIS — Z6841 Body Mass Index (BMI) 40.0 and over, adult: Secondary | ICD-10-CM

## 2014-01-25 DIAGNOSIS — M79609 Pain in unspecified limb: Secondary | ICD-10-CM | POA: Diagnosis present

## 2014-01-25 DIAGNOSIS — J42 Unspecified chronic bronchitis: Secondary | ICD-10-CM | POA: Diagnosis present

## 2014-01-25 DIAGNOSIS — K219 Gastro-esophageal reflux disease without esophagitis: Secondary | ICD-10-CM | POA: Diagnosis present

## 2014-01-25 DIAGNOSIS — R609 Edema, unspecified: Secondary | ICD-10-CM

## 2014-01-25 DIAGNOSIS — N058 Unspecified nephritic syndrome with other morphologic changes: Secondary | ICD-10-CM | POA: Diagnosis present

## 2014-01-25 DIAGNOSIS — L408 Other psoriasis: Secondary | ICD-10-CM | POA: Diagnosis present

## 2014-01-25 DIAGNOSIS — I5041 Acute combined systolic (congestive) and diastolic (congestive) heart failure: Secondary | ICD-10-CM | POA: Diagnosis present

## 2014-01-25 DIAGNOSIS — Z794 Long term (current) use of insulin: Secondary | ICD-10-CM

## 2014-01-25 DIAGNOSIS — R9431 Abnormal electrocardiogram [ECG] [EKG]: Secondary | ICD-10-CM

## 2014-01-25 DIAGNOSIS — E1129 Type 2 diabetes mellitus with other diabetic kidney complication: Secondary | ICD-10-CM | POA: Diagnosis present

## 2014-01-25 DIAGNOSIS — F411 Generalized anxiety disorder: Secondary | ICD-10-CM | POA: Diagnosis present

## 2014-01-25 DIAGNOSIS — K625 Hemorrhage of anus and rectum: Secondary | ICD-10-CM

## 2014-01-25 DIAGNOSIS — R0602 Shortness of breath: Secondary | ICD-10-CM

## 2014-01-25 DIAGNOSIS — I10 Essential (primary) hypertension: Secondary | ICD-10-CM

## 2014-01-25 DIAGNOSIS — E119 Type 2 diabetes mellitus without complications: Secondary | ICD-10-CM | POA: Diagnosis present

## 2014-01-25 DIAGNOSIS — E669 Obesity, unspecified: Secondary | ICD-10-CM

## 2014-01-25 DIAGNOSIS — I5043 Acute on chronic combined systolic (congestive) and diastolic (congestive) heart failure: Principal | ICD-10-CM | POA: Diagnosis present

## 2014-01-25 DIAGNOSIS — L409 Psoriasis, unspecified: Secondary | ICD-10-CM

## 2014-01-25 DIAGNOSIS — I5042 Chronic combined systolic (congestive) and diastolic (congestive) heart failure: Secondary | ICD-10-CM

## 2014-01-25 DIAGNOSIS — T8459XA Infection and inflammatory reaction due to other internal joint prosthesis, initial encounter: Secondary | ICD-10-CM

## 2014-01-25 DIAGNOSIS — I4891 Unspecified atrial fibrillation: Secondary | ICD-10-CM | POA: Diagnosis present

## 2014-01-25 DIAGNOSIS — Z87891 Personal history of nicotine dependence: Secondary | ICD-10-CM

## 2014-01-25 DIAGNOSIS — F329 Major depressive disorder, single episode, unspecified: Secondary | ICD-10-CM | POA: Diagnosis present

## 2014-01-25 DIAGNOSIS — M109 Gout, unspecified: Secondary | ICD-10-CM | POA: Diagnosis present

## 2014-01-25 DIAGNOSIS — E785 Hyperlipidemia, unspecified: Secondary | ICD-10-CM | POA: Diagnosis present

## 2014-01-25 DIAGNOSIS — J9600 Acute respiratory failure, unspecified whether with hypoxia or hypercapnia: Secondary | ICD-10-CM

## 2014-01-25 DIAGNOSIS — R601 Generalized edema: Secondary | ICD-10-CM

## 2014-01-25 DIAGNOSIS — M1990 Unspecified osteoarthritis, unspecified site: Secondary | ICD-10-CM | POA: Diagnosis present

## 2014-01-25 DIAGNOSIS — I5021 Acute systolic (congestive) heart failure: Secondary | ICD-10-CM | POA: Diagnosis present

## 2014-01-25 DIAGNOSIS — Z96659 Presence of unspecified artificial knee joint: Secondary | ICD-10-CM

## 2014-01-25 DIAGNOSIS — R233 Spontaneous ecchymoses: Secondary | ICD-10-CM

## 2014-01-25 LAB — URINALYSIS, ROUTINE W REFLEX MICROSCOPIC
Bilirubin Urine: NEGATIVE
Bilirubin Urine: NEGATIVE
GLUCOSE, UA: NEGATIVE mg/dL
Glucose, UA: NEGATIVE mg/dL
HGB URINE DIPSTICK: NEGATIVE
Hgb urine dipstick: NEGATIVE
KETONES UR: NEGATIVE mg/dL
Ketones, ur: NEGATIVE mg/dL
NITRITE: NEGATIVE
Nitrite: NEGATIVE
PROTEIN: NEGATIVE mg/dL
Protein, ur: 30 mg/dL — AB
SPECIFIC GRAVITY, URINE: 1.013 (ref 1.005–1.030)
SPECIFIC GRAVITY, URINE: 1.012 (ref 1.005–1.030)
Urobilinogen, UA: 0.2 mg/dL (ref 0.0–1.0)
Urobilinogen, UA: 0.2 mg/dL (ref 0.0–1.0)
pH: 5.5 (ref 5.0–8.0)
pH: 5 (ref 5.0–8.0)

## 2014-01-25 LAB — URINE MICROSCOPIC-ADD ON

## 2014-01-25 LAB — PROTIME-INR
INR: 0.97 (ref 0.00–1.49)
Prothrombin Time: 12.7 seconds (ref 11.6–15.2)

## 2014-01-25 LAB — COMPREHENSIVE METABOLIC PANEL
ALBUMIN: 2.9 g/dL — AB (ref 3.5–5.2)
AST: 9 U/L (ref 0–37)
Alkaline Phosphatase: 82 U/L (ref 39–117)
BILIRUBIN TOTAL: 0.3 mg/dL (ref 0.3–1.2)
BUN: 46 mg/dL — ABNORMAL HIGH (ref 6–23)
CHLORIDE: 102 meq/L (ref 96–112)
CO2: 32 meq/L (ref 19–32)
Calcium: 8.9 mg/dL (ref 8.4–10.5)
Creatinine, Ser: 2.45 mg/dL — ABNORMAL HIGH (ref 0.50–1.10)
GFR calc Af Amer: 23 mL/min — ABNORMAL LOW (ref >90)
GFR, EST NON AFRICAN AMERICAN: 20 mL/min — AB (ref >90)
Glucose, Bld: 90 mg/dL (ref 70–99)
Potassium: 3.5 mEq/L — ABNORMAL LOW (ref 3.7–5.3)
Sodium: 145 mEq/L (ref 137–147)
Total Protein: 7.2 g/dL (ref 6.0–8.3)

## 2014-01-25 LAB — CBC WITH DIFFERENTIAL/PLATELET
BASOS ABS: 0 10*3/uL (ref 0.0–0.1)
BASOS PCT: 0 % (ref 0–1)
Eosinophils Absolute: 0.2 10*3/uL (ref 0.0–0.7)
Eosinophils Relative: 4 % (ref 0–5)
HCT: 29.1 % — ABNORMAL LOW (ref 36.0–46.0)
Hemoglobin: 8.8 g/dL — ABNORMAL LOW (ref 12.0–15.0)
LYMPHS PCT: 11 % — AB (ref 12–46)
Lymphs Abs: 0.6 10*3/uL — ABNORMAL LOW (ref 0.7–4.0)
MCH: 26.6 pg (ref 26.0–34.0)
MCHC: 30.2 g/dL (ref 30.0–36.0)
MCV: 87.9 fL (ref 78.0–100.0)
Monocytes Absolute: 0.4 10*3/uL (ref 0.1–1.0)
Monocytes Relative: 7 % (ref 3–12)
NEUTROS ABS: 4.4 10*3/uL (ref 1.7–7.7)
Neutrophils Relative %: 78 % — ABNORMAL HIGH (ref 43–77)
PLATELETS: 162 10*3/uL (ref 150–400)
RBC: 3.31 MIL/uL — ABNORMAL LOW (ref 3.87–5.11)
RDW: 16 % — AB (ref 11.5–15.5)
WBC: 5.7 10*3/uL (ref 4.0–10.5)

## 2014-01-25 LAB — URIC ACID: URIC ACID, SERUM: 6.1 mg/dL (ref 2.4–7.0)

## 2014-01-25 LAB — TROPONIN I: Troponin I: <0.3 ng/mL (ref <0.30)

## 2014-01-25 LAB — PRO B NATRIURETIC PEPTIDE: PRO B NATRI PEPTIDE: 1266 pg/mL — AB (ref 0–125)

## 2014-01-25 MED ORDER — ACETAMINOPHEN 325 MG PO TABS: 650.0000 mg | ORAL_TABLET | ORAL | Status: DC | PRN

## 2014-01-25 MED ORDER — HEPARIN SODIUM (PORCINE) 5000 UNIT/ML IJ SOLN
5000.0000 [IU] | Freq: Three times a day (TID) | INTRAMUSCULAR | Status: DC
  Administered 2014-01-26 – 2014-01-28 (×9): 5000 [IU] via SUBCUTANEOUS
  Filled 2014-01-25 (×10): qty 1

## 2014-01-25 MED ORDER — PANTOPRAZOLE SODIUM 40 MG PO TBEC
40.0000 mg | DELAYED_RELEASE_TABLET | Freq: Every day | ORAL | Status: DC
  Administered 2014-01-26 – 2014-01-28 (×3): 40 mg via ORAL
  Filled 2014-01-25 (×3): qty 1

## 2014-01-25 MED ORDER — FUROSEMIDE 10 MG/ML IJ SOLN
80.0000 mg | Freq: Once | INTRAMUSCULAR | Status: AC
  Administered 2014-01-25: 80 mg via INTRAVENOUS
  Filled 2014-01-25: qty 8

## 2014-01-25 MED ORDER — PANTOPRAZOLE SODIUM 40 MG PO TBEC: 40.0000 mg | DELAYED_RELEASE_TABLET | Freq: Every day | ORAL | Status: DC

## 2014-01-25 MED ORDER — POTASSIUM CHLORIDE CRYS ER 20 MEQ PO TBCR
40.0000 meq | EXTENDED_RELEASE_TABLET | Freq: Once | ORAL | Status: AC
  Administered 2014-01-26: 40 meq via ORAL
  Filled 2014-01-25: qty 2

## 2014-01-25 MED ORDER — ALLOPURINOL 100 MG PO TABS
100.0000 mg | ORAL_TABLET | Freq: Every day | ORAL | Status: DC
  Administered 2014-01-26 – 2014-01-28 (×3): 100 mg via ORAL
  Filled 2014-01-25 (×3): qty 1

## 2014-01-25 MED ORDER — ALPRAZOLAM 0.25 MG PO TABS
0.2500 mg | ORAL_TABLET | Freq: Every day | ORAL | Status: DC
  Administered 2014-01-26 – 2014-01-27 (×3): 0.25 mg via ORAL
  Filled 2014-01-25 (×3): qty 1

## 2014-01-25 MED ORDER — POLYETHYLENE GLYCOL 3350 17 G PO PACK
17.0000 g | PACK | Freq: Two times a day (BID) | ORAL | Status: DC
  Administered 2014-01-26 – 2014-01-28 (×6): 17 g via ORAL
  Filled 2014-01-25 (×7): qty 1

## 2014-01-25 MED ORDER — INSULIN GLARGINE 100 UNIT/ML ~~LOC~~ SOLN
15.0000 [IU] | Freq: Two times a day (BID) | SUBCUTANEOUS | Status: DC
  Administered 2014-01-26 – 2014-01-28 (×5): 15 [IU] via SUBCUTANEOUS
  Filled 2014-01-25 (×7): qty 0.15

## 2014-01-25 MED ORDER — ALBUTEROL SULFATE (2.5 MG/3ML) 0.083% IN NEBU: 2.5000 mg | INHALATION_SOLUTION | Freq: Four times a day (QID) | RESPIRATORY_TRACT | Status: DC | PRN

## 2014-01-25 MED ORDER — FUROSEMIDE 10 MG/ML IJ SOLN
80.0000 mg | Freq: Two times a day (BID) | INTRAMUSCULAR | Status: DC
  Administered 2014-01-26: 80 mg via INTRAVENOUS
  Filled 2014-01-25 (×3): qty 8

## 2014-01-25 MED ORDER — TRAZODONE 25 MG HALF TABLET
25.0000 mg | ORAL_TABLET | Freq: Every day | ORAL | Status: DC
  Administered 2014-01-26 (×2): 25 mg via ORAL
  Filled 2014-01-25 (×3): qty 1

## 2014-01-25 MED ORDER — ISOSORBIDE DINITRATE 10 MG PO TABS
10.0000 mg | ORAL_TABLET | Freq: Two times a day (BID) | ORAL | Status: DC
  Administered 2014-01-26 – 2014-01-27 (×5): 10 mg via ORAL
  Filled 2014-01-25 (×7): qty 1

## 2014-01-25 MED ORDER — METOCLOPRAMIDE HCL 5 MG PO TABS
5.0000 mg | ORAL_TABLET | Freq: Three times a day (TID) | ORAL | Status: DC
  Administered 2014-01-26 – 2014-01-28 (×9): 5 mg via ORAL
  Filled 2014-01-25 (×10): qty 1

## 2014-01-25 MED ORDER — AMLODIPINE BESYLATE 10 MG PO TABS
10.0000 mg | ORAL_TABLET | Freq: Every day | ORAL | Status: DC
  Administered 2014-01-26 – 2014-01-28 (×3): 10 mg via ORAL
  Filled 2014-01-25 (×3): qty 1

## 2014-01-25 MED ORDER — METOPROLOL TARTRATE 12.5 MG HALF TABLET
12.5000 mg | ORAL_TABLET | Freq: Two times a day (BID) | ORAL | Status: DC
  Administered 2014-01-26 – 2014-01-28 (×3): 12.5 mg via ORAL
  Filled 2014-01-25 (×6): qty 1

## 2014-01-25 MED ORDER — ONDANSETRON HCL 4 MG/2ML IJ SOLN
4.0000 mg | Freq: Four times a day (QID) | INTRAMUSCULAR | Status: DC | PRN
Start: 2014-01-25 — End: 2014-01-28
  Administered 2014-01-26: 4 mg via INTRAVENOUS
  Filled 2014-01-25: qty 2

## 2014-01-25 MED ORDER — SODIUM CHLORIDE 0.9 % IV SOLN: 250.0000 mL | INTRAVENOUS | Status: DC | PRN

## 2014-01-25 MED ORDER — SODIUM CHLORIDE 0.9 % IJ SOLN: 3.0000 mL | INTRAMUSCULAR | Status: DC | PRN

## 2014-01-25 MED ORDER — ATORVASTATIN CALCIUM 20 MG PO TABS
20.0000 mg | ORAL_TABLET | Freq: Every day | ORAL | Status: DC
  Administered 2014-01-26 – 2014-01-27 (×3): 20 mg via ORAL
  Filled 2014-01-25 (×4): qty 1

## 2014-01-25 MED ORDER — HYDRALAZINE HCL 50 MG PO TABS
125.0000 mg | ORAL_TABLET | Freq: Three times a day (TID) | ORAL | Status: DC
  Administered 2014-01-26 – 2014-01-28 (×9): 125 mg via ORAL
  Filled 2014-01-25 (×10): qty 1

## 2014-01-25 MED ORDER — ALBUTEROL SULFATE HFA 108 (90 BASE) MCG/ACT IN AERS: 2.0000 | INHALATION_SPRAY | Freq: Four times a day (QID) | RESPIRATORY_TRACT | Status: DC | PRN

## 2014-01-25 MED ORDER — SODIUM CHLORIDE 0.9 % IV SOLN: INTRAVENOUS | Status: DC

## 2014-01-25 MED ORDER — SODIUM CHLORIDE 0.9 % IJ SOLN
3.0000 mL | Freq: Two times a day (BID) | INTRAMUSCULAR | Status: DC
  Administered 2014-01-26 (×3): 3 mL via INTRAVENOUS

## 2014-01-25 MED ORDER — AMIODARONE HCL 200 MG PO TABS
200.0000 mg | ORAL_TABLET | Freq: Every day | ORAL | Status: DC
  Administered 2014-01-26 – 2014-01-28 (×3): 200 mg via ORAL
  Filled 2014-01-25 (×3): qty 1

## 2014-01-25 MED ORDER — OXYCODONE-ACETAMINOPHEN 5-325 MG PO TABS
2.0000 | ORAL_TABLET | Freq: Four times a day (QID) | ORAL | Status: DC | PRN
  Administered 2014-01-26: 2 via ORAL
  Filled 2014-01-25: qty 2

## 2014-01-25 NOTE — ED Notes (Signed)
Admitting MD at bedside.

## 2014-01-25 NOTE — ED Notes (Addendum)
Per EMS: Pt from home with increased left arm edema x 3 days. Hx of CHF with edema to all extremities at baseline. Denies pain. Denies increased SOB, on 3L [place] at home. Reports cough x 3 days. CBG 95. HR 62. BP 160/90. 98% 3L. 18 RR. AO x4.

## 2014-01-25 NOTE — ED Notes (Signed)
Social work at bedside.  

## 2014-01-25 NOTE — ED Notes (Signed)
MD Wentz at bedside.  

## 2014-01-25 NOTE — ED Provider Notes (Signed)
CSN: [id number]     Arrival date & time 01/25/14  1428 History   First MD Initiated Contact with Patient 01/25/14 1504     Chief Complaint  Patient presents with  . Arm Swelling     (Consider location/radiation/quality/duration/timing/severity/associated sxs/prior Treatment) The history is provided by the patient.   [name] is a 63 y.o. female who presents for evaluation of left arm swelling and right toe #1 pain. Left arm has been swollen for several days. She has increased pain with that when she holds it down and has a mild soreness, at rest. Her toe started hurting yesterday. There's been no, trauma to left arm, neck or right great toe. She denies fever, chills, nausea, vomiting, chest pain, back pain, or dizziness. She's been anorexic for several days and states that "I force myself to eat." She has had nausea without vomiting. She has increasing shortness of breath, and uses oxygen at home. She is taking her usual medications, without relief. She's been coagulated in the past, but stopped because of GI bleeding, and a hematoma. She is at home with her husband. She came here by EMS. She does not ambulate much. There are no other known modifying factors.   Past Medical History  Diagnosis Date  . Asthma   . Bronchitis   . Allergic rhinitis   . HTN (hypertension)   . Hyperlipidemia   . Obesity   . OSA (obstructive sleep apnea)     poor compliance with cpap  . CHF (congestive heart failure)   . Biventricular failure     compensated  . Psoriasis   . Stasis edema     of lower extremities  . Depression   . Situational stress   . Anemia   . Gout   . Chronic anticoagulation     off due to GIB, thigh hematoma  . Yeast infection involving the vagina and surrounding area   . Atrial fibrillation with RVR   . GERD (gastroesophageal reflux disease)   . Chronic bronchitis     "get it q yr" (08/10/2013)  . Pneumonia     "said I did on 08/03/2013 then they ruled it out"  (08/10/2013)  . Shortness of breath     "all the time" (08/10/2013)  . On home oxygen therapy     "2L during the night and prn during the day" (08/10/2013)  . Type II diabetes mellitus   . History of blood transfusion     "few during my lifetime; last time was 4 days ago when I got 2 units" (08/10/2013)  . Stroke ?2007    denies residuals on 08/10/2013  . DJD (degenerative joint disease)   . Bleeding     "from my skin folds; just won't stop" (08/10/2013)  . Complication of anesthesia     "they gave me too much; I stayed out of it for awhile" (08/10/2013)  . Anxiety   . Kidney stones   . Kidney disease, chronic, stage III (GFR 30-59 ml/min)   . Chronic kidney disease, stage IV (severe)   . Morbid obesity    Past Surgical History  Procedure Laterality Date  . Appendectomy    . Cholecystectomy    . Total knee arthroplasty Left ~ 2006  . Total abdominal hysterectomy    . Back surgery    . Cystectomy Left     hand  . Tonsillectomy    . Joint replacement    . Lumbar disc surgery    .  Colonoscopy N/A 08/16/2013    Procedure: COLONOSCOPY;  Surgeon: Beryle Beams, MD;  Location: [facility];  Service: Endoscopy;  Laterality: N/A;  . Hematoma evacuation Right 09/10/2013    Procedure: EVACUATION OF RIGHT LEG HEMATOMA AND EXCISION [place] RIGHT LEG ;  Surgeon: Merrie Roof, MD;  Location: [facility];  Service: General;  Laterality: Right;  . Debridement and closure wound Right 09/22/2013    Procedure: IRRIGATION AND DEBRIDEMENT SURGICAL PREP PLACEMENT OF [place]  ;  Surgeon: Irene Limbo, MD;  Location: [facility];  Service: Plastics;  Laterality: Right;  . Skin split graft Right 10/07/2013    Procedure: SPLIT THICKNESS SKIN GRAFT RIGHT THIGH TO RIGHT LEG, PLACEMENT OF A CEL AND WOUND VAC TO RIGHT THIGH  ;  Surgeon: Irene Limbo, MD;  Location: [facility];  Service: Plastics;  Laterality: Right;   Family History  Problem Relation Age of Onset  . Heart attack Father   . Asthma Father   .  Heart disease Paternal Uncle   . Rectal cancer Paternal Aunt   . Other Mother     mva   History  Substance Use Topics  . Smoking status: Former Smoker -- 0.12 packs/day for 12 years    Types: Cigarettes    Quit date: 07/05/1982  . Smokeless tobacco: Never Used     Comment: smoked for 1.5 yrs about 2 cigs a day ,only if stressed  . Alcohol Use: No   OB History   Grav Para Term Preterm Abortions TAB SAB Ect Mult Living                 Review of Systems  All other systems reviewed and are negative.      Allergies  Daptomycin; Morphine and related; Cephalexin; Tramadol; Celecoxib; Codeine; Fluoxetine hcl; Latex; Ofloxacin; Penicillins; Rofecoxib; and Sulfonamide derivatives  Home Medications   Current Outpatient Rx  Name  Route  Sig  Dispense  Refill  . albuterol (PROVENTIL HFA;VENTOLIN HFA) 108 (90 BASE) MCG/ACT inhaler   Inhalation   Inhale 2 puffs into the lungs every 6 (six) hours as needed for wheezing or shortness of breath.          [name] albuterol (PROVENTIL) (2.5 MG/3ML) 0.083% nebulizer solution   Nebulization   Take 2.5 mg by nebulization every 6 (six) hours as needed for wheezing or shortness of breath.         . allopurinol (ZYLOPRIM) 100 MG tablet   Oral   Take 100 mg by mouth daily. Patient takes at 0800am daily         . ALPRAZolam (XANAX) 0.25 MG tablet   Oral   Take 1 tablet (0.25 mg total) by mouth at bedtime.   30 tablet   0   . amiodarone (PACERONE) 200 MG tablet   Oral   Take 200 mg by mouth 2 (two) times daily.         [name] amLODipine (NORVASC) 10 MG tablet   Oral   Take 10 mg by mouth daily. Patient takes at 0800am daily         . atorvastatin (LIPITOR) 20 MG tablet   Oral   Take 20 mg by mouth at bedtime. Patient takes at 2100         . furosemide (LASIX) 80 MG tablet   Oral   Take 1 tablet (80 mg total) by mouth 2 (two) times daily.         . hydrALAZINE (APRESOLINE) 25  MG tablet   Oral   Take 5 tablets (125 mg total)  by mouth 3 (three) times daily.   90 tablet   0   . insulin glargine (LANTUS) 100 UNIT/ML injection   Subcutaneous   Inject 15 Units into the skin 2 (two) times daily.         . isosorbide dinitrate (ISORDIL) 10 MG tablet   Oral   Take 1 tablet (10 mg total) by mouth 2 (two) times daily.         . metoCLOPramide (REGLAN) 5 MG tablet   Oral   Take 5 mg by mouth 3 (three) times daily.         . metoprolol tartrate (LOPRESSOR) 25 MG tablet   Oral   Take 12.5 mg by mouth 2 (two) times daily.         [name] omeprazole (PRILOSEC) 20 MG capsule   Oral   Take 20 mg by mouth daily.         [name] oxyCODONE-acetaminophen (PERCOCET/ROXICET) 5-325 MG per tablet   Oral   Take 2 tablets by mouth every 6 (six) hours as needed for severe pain.   30 tablet   0   . pantoprazole (PROTONIX) 40 MG tablet   Oral   Take 40 mg by mouth daily.         . polyethylene glycol (MIRALAX / GLYCOLAX) packet   Oral   Take 17 g by mouth 2 (two) times daily.         . traZODone (DESYREL) 50 MG tablet   Oral   Take 25 mg by mouth at bedtime.         . docusate sodium (COLACE) 100 MG capsule   Oral   Take 100 mg by mouth 2 (two) times daily.         . ferrous sulfate 325 (65 FE) MG tablet   Oral   Take 325 mg by mouth 3 (three) times daily with meals. Patient takes at 0800, 1200, and 1700         . levalbuterol (XOPENEX) 0.63 MG/3ML nebulizer solution   Nebulization   Take 3 mLs (0.63 mg total) by nebulization 3 (three) times daily.   3 mL   12   . potassium chloride (KLOR-CON) 20 MEQ packet   Oral   Take 20 mEq by mouth daily.         [name] tuberculin 5 UNIT/0.1ML injection   Intradermal   Inject 0.1 mLs into the skin once.          BP 160/57  Pulse 57  Temp(Src) 98 F (36.7 C)  Resp 24  Ht 5\' 3"  (1.6 m)  Wt 269 lb 5 oz (122.159 kg)  BMI 47.72 kg/m2  SpO2 97% Physical Exam  Nursing note and vitals reviewed. Constitutional: She is oriented to person, place, and time.  She appears well-developed.  Morbidly obese  HENT:  Head: Normocephalic and atraumatic.  Eyes: Conjunctivae and EOM are normal. Pupils are equal, round, and reactive to light.  Neck: Normal range of motion and phonation normal. Neck supple.  Cardiovascular: Normal rate, regular rhythm and intact distal pulses.   Legs have skin changes, consistent with chronic venous insufficiency.  Pulmonary/Chest: Effort normal and breath sounds normal. No respiratory distress. She has no wheezes. She exhibits no tenderness.  Abdominal: Soft. She exhibits no distension. There is tenderness (Epigastric, mild). There is no guarding.  Musculoskeletal: She exhibits edema (Bilateral symmetric lower external, 3+ edema.).  Right great toe is tender without deformity, swelling or erythema. Left arm has significant swelling, 3-4+ edema. There is normal active movement of the left arm, without significant pain. No deformity, right arm or legs.  Neurological: She is alert and oriented to person, place, and time. She exhibits normal muscle tone.  Skin: Skin is warm and dry.  Psychiatric: She has a normal mood and affect. Her behavior is normal. Judgment and thought content normal.    ED Course  Procedures (including critical care time)  Medications  furosemide (LASIX) injection 80 mg (not administered)    Patient Vitals for the past 24 hrs:  BP Temp Pulse Resp SpO2 Height Weight  01/25/14 2019 - - - - - - 269 lb 5 oz (122.159 kg)  01/25/14 2000 160/57 mmHg - 57 - 97 % - -  01/25/14 1845 160/49 mmHg - 56 - 97 % - -  01/25/14 1800 166/50 mmHg - 58 - 94 % - -  01/25/14 1530 172/49 mmHg - 55 24 96 % - -  01/25/14 1525 168/51 mmHg - 63 18 - - -  01/25/14 1500 166/48 mmHg - 56 26 95 % - -  01/25/14 1438 166/49 mmHg 98 F (36.7 C) 60 18 96 % 5\' 3"  (1.6 m) 262 lb (118.842 kg)    7:58 PM Reevaluation with update and discussion. After initial assessment and treatment, an updated evaluation reveals no additional  complaints. Melania Kirks L   9:02 PM-Consult complete with Dr Alcario Drought. Patient case explained and discussed. He agrees to admit patient for further evaluation and treatment. Call ended at [place] Reviewed  CBC WITH DIFFERENTIAL - Abnormal; Notable for the following:    RBC 3.31 (*)    Hemoglobin 8.8 (*)    HCT 29.1 (*)    RDW 16.0 (*)    Neutrophils Relative % 78 (*)    Lymphocytes Relative 11 (*)    Lymphs Abs 0.6 (*)    All other components within normal limits  COMPREHENSIVE METABOLIC PANEL - Abnormal; Notable for the following:    Potassium 3.5 (*)    BUN 46 (*)    Creatinine, Ser 2.45 (*)    Albumin 2.9 (*)    GFR calc non Af Amer 20 (*)    GFR calc Af Amer 23 (*)    All other components within normal limits  URINALYSIS, ROUTINE W REFLEX MICROSCOPIC - Abnormal; Notable for the following:    APPearance CLOUDY (*)    Protein, ur 30 (*)    Leukocytes, UA TRACE (*)    All other components within normal limits  URINE MICROSCOPIC-ADD ON - Abnormal; Notable for the following:    Squamous Epithelial / LPF FEW (*)    Bacteria, UA MANY (*)    All other components within normal limits  PRO B NATRIURETIC PEPTIDE - Abnormal; Notable for the following:    Pro B Natriuretic peptide (BNP) 1266.0 (*)    All other components within normal limits  URINE CULTURE  PROTIME-INR  URIC ACID  URINALYSIS, ROUTINE W REFLEX MICROSCOPIC   Imaging Review Dg Chest 2 View  01/25/2014   CLINICAL DATA:  Arm swelling, PICC toe pain and swelling, history hypertension, diabetes, CHF, asthma, stage IV chronic kidney disease  EXAM: CHEST  2 VIEW  COMPARISON:  12/12/2013  FINDINGS: Enlargement of cardiac silhouette with pulmonary vascular congestion.  Diffuse pulmonary infiltrates likely representing pulmonary edema and CHF.  Bibasilar effusions.  No pneumothorax.  IMPRESSION: Pulmonary edema  increased since previous exam.   Electronically Signed   By: Lavonia Dana M.D.   On: 01/25/2014 16:19    Dg Foot Complete Right  01/25/2014   CLINICAL DATA:  First toe pain and swelling.  EXAM: RIGHT FOOT COMPLETE - 3+ VIEW  COMPARISON:  08/21/2006  FINDINGS: There is diffuse soft tissue swelling. There is diffuse decreased bone mineralization. There is minimal degenerative change over the first MTP joint and midfoot. There is mild erosive change along the anterior medial aspect of the navicular bone. There is a moderate size calcaneal spur inferiorly. No acute fracture or dislocation.  IMPRESSION: Moderate diffuse soft tissue swelling over the right foot. Mild erosive change along the anterior medial aspect of the navicular bone which may be due to chronic inflammatory arthropathy versus acute infection.   Electronically Signed   By: Marin Olp M.D.   On: 01/25/2014 16:14    EKG Interpretation    Date/Time:  Tuesday January 25 2014 14:49:23 EST Ventricular Rate:  57 PR Interval:  158 QRS Duration: 79 QT Interval:  694 QTC Calculation: 676 R Axis:   61 Text Interpretation:  Sinus rhythm Probable left atrial enlargement Borderline repolarization abnormality Borderline ST elevation, lateral leads Prolonged QT interval Since last tracing QT has lengthened Confirmed by Lovelle Deitrick  MD, Byan Poplaski ([id number]) on 01/25/2014 3:15:34 PM            MDM   Final diagnoses:  Congestive heart failure, acute  Prolonged Q-T interval on ECG  Anasarca    Swelling left arm is likely secondary to anasarca. No evidence for DVT, cellulitis or isolated edema. Her weight is 7 pounds greater today than one month ago, at discharge after treatment for congestive heart failure. She is not in respiratory distress. Her QT is significantly prolonged, and may be medication related. This will need to be monitored closely and she will likely have to have some medication changes. This will require her to be monitored while hospitalized.   Nursing Notes Reviewed/ Care Coordinated, and agree without changes. Applicable Imaging  Reviewed.  Interpretation of Laboratory Data incorporated into ED treatment  Plan: Admit   Richarda Blade, MD 01/25/14 2354

## 2014-01-25 NOTE — ED Notes (Signed)
Dr. Wentz at bedside. 

## 2014-01-25 NOTE — H&P (Addendum)
[facility] History and Physical  NERISSA STARR [id number] DOB: 09/04/1951 DOA: 01/25/2014  Referring physician: EDP PCP: Garwin Brothers, MD   Chief Complaint: Arm swelling   HPI: [name] is a 63 y.o. female well known to our service, she has a long and extensive history of CHF.  She reports worsening L arm edema for the past several days.  This has also been associated with increasing SOB on oxygen at home and orthopnea.  Usual medications including lasix 80mg  BID PO at home have provided no relief.  Work up in the ED demonstrates B pleural effusions and pulmonary edema.  Review of Systems: Positive for nausea, no vomiting, Systems reviewed.  As above, otherwise negative  Past Medical History  Diagnosis Date  . Asthma   . Bronchitis   . Allergic rhinitis   . HTN (hypertension)   . Hyperlipidemia   . Obesity   . OSA (obstructive sleep apnea)     poor compliance with cpap  . CHF (congestive heart failure)   . Biventricular failure     compensated  . Psoriasis   . Stasis edema     of lower extremities  . Depression   . Situational stress   . Anemia   . Gout   . Chronic anticoagulation     off due to GIB, thigh hematoma  . Yeast infection involving the vagina and surrounding area   . Atrial fibrillation with RVR   . GERD (gastroesophageal reflux disease)   . Chronic bronchitis     "get it q yr" (08/10/2013)  . Pneumonia     "said I did on 08/03/2013 then they ruled it out" (08/10/2013)  . Shortness of breath     "all the time" (08/10/2013)  . On home oxygen therapy     "2L during the night and prn during the day" (08/10/2013)  . Type II diabetes mellitus   . History of blood transfusion     "few during my lifetime; last time was 4 days ago when I got 2 units" (08/10/2013)  . Stroke ?2007    denies residuals on 08/10/2013  . DJD (degenerative joint disease)   . Bleeding     "from my skin folds; just won't stop" (08/10/2013)  . Complication of anesthesia      "they gave me too much; I stayed out of it for awhile" (08/10/2013)  . Anxiety   . Kidney stones   . Kidney disease, chronic, stage III (GFR 30-59 ml/min)   . Chronic kidney disease, stage IV (severe)   . Morbid obesity    Past Surgical History  Procedure Laterality Date  . Appendectomy    . Cholecystectomy    . Total knee arthroplasty Left ~ 2006  . Total abdominal hysterectomy    . Back surgery    . Cystectomy Left     hand  . Tonsillectomy    . Joint replacement    . Lumbar disc surgery    . Colonoscopy N/A 08/16/2013    Procedure: COLONOSCOPY;  Surgeon: Beryle Beams, MD;  Location: [facility];  Service: Endoscopy;  Laterality: N/A;  . Hematoma evacuation Right 09/10/2013    Procedure: EVACUATION OF RIGHT LEG HEMATOMA AND EXCISION [place] RIGHT LEG ;  Surgeon: Merrie Roof, MD;  Location: [facility];  Service: General;  Laterality: Right;  . Debridement and closure wound Right 09/22/2013    Procedure: IRRIGATION AND DEBRIDEMENT SURGICAL PREP PLACEMENT OF ACELL AND VAC  ;  Surgeon: Irene Limbo, MD;  Location: [facility];  Service: Plastics;  Laterality: Right;  . Skin split graft Right 10/07/2013    Procedure: SPLIT THICKNESS SKIN GRAFT RIGHT THIGH TO RIGHT LEG, PLACEMENT OF A CEL AND WOUND VAC TO RIGHT THIGH  ;  Surgeon: Irene Limbo, MD;  Location: [facility];  Service: Plastics;  Laterality: Right;   Social History:  reports that she quit smoking about 31 years ago. Her smoking use included Cigarettes. She has a 1.44 pack-year smoking history. She has never used smokeless tobacco. She reports that she does not drink alcohol or use illicit drugs.  Allergies  Allergen Reactions  . Daptomycin Other (See Comments)    Elevated CPK  . Morphine And Related Nausea And Vomiting  . Cephalexin Rash  . Tramadol Nausea And Vomiting  . Celecoxib Other (See Comments)    Unknown, can use furosemide without issue  . Codeine Other (See Comments)    unknown  . Fluoxetine Hcl Other (See  Comments)    unknown  . Latex Other (See Comments)  . Ofloxacin Other (See Comments)    unknown  . Penicillins Other (See Comments)    unknown  . Rofecoxib Other (See Comments)    unknown  . Sulfonamide Derivatives Other (See Comments)    unknown    Family History  Problem Relation Age of Onset  . Heart attack Father   . Asthma Father   . Heart disease Paternal Uncle   . Rectal cancer Paternal Aunt   . Other Mother     mva     Prior to Admission medications   Medication Sig Start Date End Date Taking? Authorizing Provider  albuterol (PROVENTIL HFA;VENTOLIN HFA) 108 (90 BASE) MCG/ACT inhaler Inhale 2 puffs into the lungs every 6 (six) hours as needed for wheezing or shortness of breath.    Yes Historical Provider, MD  albuterol (PROVENTIL) (2.5 MG/3ML) 0.083% nebulizer solution Take 2.5 mg by nebulization every 6 (six) hours as needed for wheezing or shortness of breath.   Yes Historical Provider, MD  allopurinol (ZYLOPRIM) 100 MG tablet Take 100 mg by mouth daily. Patient takes at 0800am daily 09/16/11  Yes Historical Provider, MD  ALPRAZolam (XANAX) 0.25 MG tablet Take 1 tablet (0.25 mg total) by mouth at bedtime. 12/15/13  Yes Nita Sells, MD  amiodarone (PACERONE) 200 MG tablet Take 200 mg by mouth daily.    Yes Historical Provider, MD  amLODipine (NORVASC) 10 MG tablet Take 10 mg by mouth daily. Patient takes at 0800am daily 08/19/13  Yes Thurnell Lose, MD  atorvastatin (LIPITOR) 20 MG tablet Take 20 mg by mouth at bedtime. Patient takes at 2100   Yes Historical Provider, MD  furosemide (LASIX) 80 MG tablet Take 1 tablet (80 mg total) by mouth 2 (two) times daily. 12/16/13  Yes Nita Sells, MD  hydrALAZINE (APRESOLINE) 25 MG tablet Take 5 tablets (125 mg total) by mouth 3 (three) times daily. 12/15/13  Yes Nita Sells, MD  insulin glargine (LANTUS) 100 UNIT/ML injection Inject 15 Units into the skin 2 (two) times daily.   Yes Historical Provider, MD   isosorbide dinitrate (ISORDIL) 10 MG tablet Take 1 tablet (10 mg total) by mouth 2 (two) times daily. 11/22/13  Yes Charlynne Cousins, MD  metoCLOPramide (REGLAN) 5 MG tablet Take 5 mg by mouth 3 (three) times daily.   Yes Historical Provider, MD  metoprolol tartrate (LOPRESSOR) 25 MG tablet Take 12.5 mg by mouth 2 (two) times daily.  Yes Historical Provider, MD  omeprazole (PRILOSEC) 20 MG capsule Take 20 mg by mouth daily.   Yes Historical Provider, MD  oxyCODONE-acetaminophen (PERCOCET/ROXICET) 5-325 MG per tablet Take 2 tablets by mouth every 6 (six) hours as needed for severe pain. 12/15/13  Yes Nita Sells, MD  pantoprazole (PROTONIX) 40 MG tablet Take 40 mg by mouth daily.   Yes Historical Provider, MD  polyethylene glycol (MIRALAX / GLYCOLAX) packet Take 17 g by mouth 2 (two) times daily.   Yes Historical Provider, MD  traZODone (DESYREL) 50 MG tablet Take 25 mg by mouth at bedtime.   Yes Historical Provider, MD   Physical Exam: Filed Vitals:   01/25/14 2000  BP: 160/57  Pulse: 57  Temp:   Resp:     BP 160/57  Pulse 57  Temp(Src) 98 F (36.7 C)  Resp 24  Ht 5\' 3"  (1.6 m)  Wt 122.159 kg (269 lb 5 oz)  BMI 47.72 kg/m2  SpO2 97%  General Appearance:    Alert, oriented, no distress, appears stated age  Head:    Normocephalic, atraumatic  Eyes:    PERRL, EOMI, sclera non-icteric        Nose:   Nares without drainage or epistaxis. Mucosa, turbinates normal  Throat:   Moist mucous membranes. Oropharynx without erythema or exudate.  Neck:   Supple. No carotid bruits.  No thyromegaly.  No lymphadenopathy.   Back:     No CVA tenderness, no spinal tenderness  Lungs:     Clear to auscultation bilaterally, without wheezes, rhonchi or rales  Chest wall:    No tenderness to palpitation  Heart:    Regular rate and rhythm without murmurs, gallops, rubs  Abdomen:     Soft, non-tender, morbidly obese, normal bowel sounds, no organomegaly  Genitalia:    deferred  Rectal:     deferred  Extremities:   3-4+ pitting edema in L arm, 2-3+ in R arm  Pulses:   2+ and symmetric all extremities  Skin:   Skin color, texture, turgor normal, no rashes or lesions  Lymph nodes:   Cervical, supraclavicular, and axillary nodes normal  Neurologic:   CNII-XII intact. Normal strength, sensation and reflexes      throughout    Labs on Admission:  Basic Metabolic Panel:  Recent Labs Lab 01/25/14 1527  NA 145  K 3.5*  CL 102  CO2 32  GLUCOSE 90  BUN 46*  CREATININE 2.45*  CALCIUM 8.9   Liver Function Tests:  Recent Labs Lab 01/25/14 1527  AST 9  ALT <5  ALKPHOS 82  BILITOT 0.3  PROT 7.2  ALBUMIN 2.9*   No results found for this basename: LIPASE, AMYLASE,  in the last 168 hours No results found for this basename: AMMONIA,  in the last 168 hours CBC:  Recent Labs Lab 01/25/14 1527  WBC 5.7  NEUTROABS 4.4  HGB 8.8*  HCT 29.1*  MCV 87.9  PLT 162   Cardiac Enzymes: No results found for this basename: CKTOTAL, CKMB, CKMBINDEX, TROPONINI,  in the last 168 hours  BNP (last 3 results)  Recent Labs  12/07/13 0454 12/10/13 0915 01/25/14 1527  PROBNP 4262.0* 2368.0* 1266.0*   CBG: No results found for this basename: GLUCAP,  in the last 168 hours  Radiological Exams on Admission: Dg Chest 2 View  01/25/2014   CLINICAL DATA:  Arm swelling, PICC toe pain and swelling, history hypertension, diabetes, CHF, asthma, stage IV chronic kidney disease  EXAM: CHEST  2 VIEW  COMPARISON:  12/12/2013  FINDINGS: Enlargement of cardiac silhouette with pulmonary vascular congestion.  Diffuse pulmonary infiltrates likely representing pulmonary edema and CHF.  Bibasilar effusions.  No pneumothorax.  IMPRESSION: Pulmonary edema increased since previous exam.   Electronically Signed   By: Lavonia Dana M.D.   On: 01/25/2014 16:19   Dg Foot Complete Right  01/25/2014   CLINICAL DATA:  First toe pain and swelling.  EXAM: RIGHT FOOT COMPLETE - 3+ VIEW  COMPARISON:  08/21/2006   FINDINGS: There is diffuse soft tissue swelling. There is diffuse decreased bone mineralization. There is minimal degenerative change over the first MTP joint and midfoot. There is mild erosive change along the anterior medial aspect of the navicular bone. There is a moderate size calcaneal spur inferiorly. No acute fracture or dislocation.  IMPRESSION: Moderate diffuse soft tissue swelling over the right foot. Mild erosive change along the anterior medial aspect of the navicular bone which may be due to chronic inflammatory arthropathy versus acute infection.   Electronically Signed   By: Marin Olp M.D.   On: 01/25/2014 16:14    EKG: Independently reviewed.  Assessment/Plan Active Problems:   Acute combined systolic and diastolic heart failure   DM (diabetes mellitus), type 2 with renal complications   CKD (chronic kidney disease), stage IV   Chronic combined systolic and diastolic CHF (congestive heart failure)   CHF (congestive heart failure)   1. Acute on chronic systolic and diastolic CHF - likely due to malabsorption of lasix due to edema of GI tract (given that everything from head to toes are edematous) combined with her baseline severe CKD.  Treating with IV lasix 80 mg BID. 2. CKD stage 4 - monitor intake and output and daily creatinine, currently at baseline. 3. QT prolongation - after review of the EKG, I think the wandering baseline may be being falsely read by the EKG machine, on the few complexes that appear complete her QT does not appear prolonged and certainly not nearly 700.  Repeat EKG. 4. DM - continue home lantus, checking CBG AC/HS 5. H/o A.Fib - rhythm control with amiodarone, not on anticoagulation after complications with bleeding in October.    Code Status: Full  Family Communication: No family in room Disposition Plan: Admit to obs   Time spent: 50 min  GARDNER, JARED M. [facility] Pager [phone]  If 7AM-7PM, please contact the day team taking  care of the patient [email or web address] Password [facility] 01/25/2014, 10:32 PM

## 2014-01-25 NOTE — ED Notes (Signed)
Spoke, at length, with pt's husband re: dcp for pt.  Pt lives at home with husband and 2 grown sons.  Pt has been getting HHC through [facility], but currently receives no services.  Pt's husband reports increased difficulty in caring for pt as she is unable to do much for herself anymore.  Husband also states that he has health problems, including a bad back and bad hands.  Husband has applied for LTC Medicaid for pt and has been working with [facility] on placement, with the help of pt's PCP, Dr. Marlou Sa.  CSW called [facility] [place] [facility] and spoke with Bethena Roys who was going to check pt's [employer]/Medicaid benefits.  Case discussed with Mariann Laster, RN CM.  CSW will continue to follow.

## 2014-01-25 NOTE — ED Notes (Signed)
MD at bedside. 

## 2014-01-25 NOTE — Progress Notes (Signed)
VASCULAR LAB PRELIMINARY  PRELIMINARY  PRELIMINARY  PRELIMINARY  Left upper extremity venous duplex completed.    Preliminary report:  Technically difficult and limited duplex exam.  No obvious deep or superficial vein thrombosis noted ine the left upper extremity.    Zaryan Yakubov, RVT 01/25/2014, 5:20 PM

## 2014-01-25 NOTE — Progress Notes (Signed)
Clinical Social Work Department BRIEF PSYCHOSOCIAL ASSESSMENT 01/25/2014  Patient:  [name], [name]     Account Number:  [id number]     Admit date:  01/25/2014  Clinical Social Worker:  Ulyess Blossom  Date/Time:  01/25/2014 10:52 PM  Referred by:  RN  Date Referred:  01/25/2014 Referred for  SNF Placement   Other Referral:   Interview type:  Family Other interview type:   Database review.    PSYCHOSOCIAL DATA Living Status:  FAMILY Admitted from facility:   Level of care:   Primary support name:  Larrie Primary support relationship to patient:  SPOUSE Degree of support available:   Good.  However, pt is nearly total care and husband is finding it increasingly difficult to physically care for her.    CURRENT CONCERNS Current Concerns  Post-Acute Placement   Other Concerns:    SOCIAL WORK ASSESSMENT / PLAN CSW spoke with pt's husband re: placement for pt, who was in xray during csw visit.  Husband states that he has aplplied for LTC Medicaid and has been in contact with [facility] re: sending her there.  Pt's PCP, Dr Marlou Sa, has been assisting  pt's husband with the paperwork for placement.  Pt's husband is not firm on wanting pt to go to [facility], but pt doesn't want to return to [facility] as she has been there in the past.  Pt's husband and here two grown sons are unable to care for her the way she should be and her [place] has recently stopped.  CSW will assist with placement for pt and facilitate d/c.   Assessment/plan status:  Psychosocial Support/Ongoing Assessment of Needs Other assessment/ plan:   Information/referral to community resources:    PATIENT'S/FAMILY'S RESPONSE TO PLAN OF CARE: Pt's husband sad/frustrated with pt's condition and not being able to care for her properly.  While he hates to put her in NH, he realizes that it is best for her.  Emtoional support offered.

## 2014-01-26 DIAGNOSIS — I5021 Acute systolic (congestive) heart failure: Secondary | ICD-10-CM

## 2014-01-26 DIAGNOSIS — I10 Essential (primary) hypertension: Secondary | ICD-10-CM

## 2014-01-26 DIAGNOSIS — E1129 Type 2 diabetes mellitus with other diabetic kidney complication: Secondary | ICD-10-CM

## 2014-01-26 LAB — TROPONIN I
Troponin I: <0.3 ng/mL (ref <0.30)
Troponin I: <0.3 ng/mL (ref <0.30)

## 2014-01-26 LAB — BASIC METABOLIC PANEL
BUN: 43 mg/dL — AB (ref 6–23)
CHLORIDE: 102 meq/L (ref 96–112)
CO2: 33 mEq/L — ABNORMAL HIGH (ref 19–32)
CREATININE: 2.16 mg/dL — AB (ref 0.50–1.10)
Calcium: 8.6 mg/dL (ref 8.4–10.5)
GFR, EST AFRICAN AMERICAN: 27 mL/min — AB (ref >90)
GFR, EST NON AFRICAN AMERICAN: 23 mL/min — AB (ref >90)
GLUCOSE: 105 mg/dL — AB (ref 70–99)
Potassium: 3.6 mEq/L — ABNORMAL LOW (ref 3.7–5.3)
Sodium: 145 mEq/L (ref 137–147)

## 2014-01-26 LAB — SURGICAL PCR SCREEN
MRSA, PCR: NEGATIVE
Staphylococcus aureus: NEGATIVE

## 2014-01-26 LAB — GLUCOSE, CAPILLARY
GLUCOSE-CAPILLARY: 102 mg/dL — AB (ref 70–99)
GLUCOSE-CAPILLARY: 103 mg/dL — AB (ref 70–99)
GLUCOSE-CAPILLARY: 99 mg/dL (ref 70–99)

## 2014-01-26 MED ORDER — FUROSEMIDE 10 MG/ML IJ SOLN
10.0000 mg/h | INTRAVENOUS | Status: DC
  Administered 2014-01-26: 10 mg/h via INTRAVENOUS
  Filled 2014-01-26 (×2): qty 25

## 2014-01-26 MED ORDER — FUROSEMIDE 10 MG/ML IJ SOLN: 80.0000 mg | Freq: Three times a day (TID) | INTRAMUSCULAR | Status: DC

## 2014-01-26 MED ORDER — METOLAZONE 5 MG PO TABS
5.0000 mg | ORAL_TABLET | Freq: Every day | ORAL | Status: DC
  Administered 2014-01-26: 5 mg via ORAL
  Filled 2014-01-26 (×2): qty 1

## 2014-01-26 MED ORDER — METOLAZONE 5 MG PO TABS
5.0000 mg | ORAL_TABLET | Freq: Two times a day (BID) | ORAL | Status: DC
  Filled 2014-01-26 (×3): qty 1

## 2014-01-26 NOTE — Progress Notes (Signed)
Pt admitted to 3E06 from ER, came by stretcher, VSS, c/o pain on her left knee, pain medication given with some relieve, medication given as ordered. Pt oriented to the unit and to her room and encouraged to call for assistance to get OOB to prevent any fall or injury while in the hospital. Pt came from ER with foley catheter per MD order with clear urine. Lantus on hold for CBG of 99. Heart failure education started with the pt. We'll continue with POC.

## 2014-01-26 NOTE — Progress Notes (Signed)
UR completed. Patient changed to inpatient- requiring IV lasix BID  

## 2014-01-26 NOTE — Progress Notes (Signed)
Patient alert and oriented x4 throughout shift.  Husband at bedside this afternoon.  Patient remains very weak and can only minimally mover her legs.  Patient states she is more tired than usual, but attributes this to her scheduled Reglan.  Patient's husband requested to speak with SW, SW at bedside this afternoon.  Patient and husband state they have no questions or concerns at this time.  Will continue to monitor.

## 2014-01-26 NOTE — Progress Notes (Signed)
Clinical Social Work Department CLINICAL SOCIAL WORK PLACEMENT NOTE 01/26/2014  Patient:  [name], [name]  Account Number:  [id number] Admit date:  01/25/2014  Clinical Social Worker:  Ky Barban, Latanya Presser  Date/time:  01/26/2014 03:07 PM  Clinical Social Work is seeking post-discharge placement for this patient at the following level of care:   [facility]   (*CSW will update this form in Epic as items are completed)   N/A-authorized clincials sent to all SNFs in [facility] except [place], [place], and [place] provided with [place] Department of Clinical Social Work's list of facilities offering this level of care within the geographic area requested by the patient (or if unable, by the patient's family).  01/26/2014  Patient/family informed of their freedom to choose among providers that offer the needed level of care, that participate in Medicare, Medicaid or managed care program needed by the patient, have an available bed and are willing to accept the patient.  N/A-clinicals not sent to this SNF  Patient/family informed of [facility]' ownership interest in [facility], as well as of the fact that they are under no obligation to receive care at this facility.  PASARR submitted to EDS on EXISTING PASARR number received from EDS on EXISTING  FL2 transmitted to all facilities in geographic area requested by pt/family on  01/26/2014 FL2 transmitted to all facilities within larger geographic area on   Patient informed that his/her managed care company has contracts with or will negotiate with  certain facilities, including the following:     Patient/family informed of bed offers received:   Patient chooses bed at  Physician recommends and patient chooses bed at    Patient to be transferred to  on   Patient to be transferred to facility by   The following physician request were entered in Epic:   Additional  Comments:   . Ky Barban, MSW, [place] Social Worker [phone]

## 2014-01-26 NOTE — Progress Notes (Signed)
Clinical Social Work Department BRIEF PSYCHOSOCIAL ASSESSMENT 01/26/2014  Patient:  [name], [name]     Account Number:  [id number]     Admit date:  01/25/2014  Clinical Social Worker:  Ulyess Blossom  Date/Time:  01/25/2014 10:52 PM  Referred by:  RN  Date Referred:  01/25/2014 Referred for  SNF Placement   Other Referral:   Interview type:  Family Other interview type:   Database review.    PSYCHOSOCIAL DATA Living Status:  FAMILY Admitted from facility:   Level of care:   Primary support name:  [name] Primary support relationship to patient:  SPOUSE Degree of support available:   Good.  However, pt is nearly total care and husband is finding it increasingly difficult to physically care for her.    CURRENT CONCERNS Current Concerns  Post-Acute Placement   Other Concerns:    SOCIAL WORK ASSESSMENT / PLAN CSW spoke with pt's husband re: placement for pt, who was in xray during csw visit.  Husband states that he has aplplied for LTC Medicaid and has been in contact with [facility] re: sending her there.  Pt's PCP, Dr Marlou Sa, has been assisting  pt's husband with the paperwork for placement.  Pt's husband is not firm on wanting pt to go to [facility], but pt doesn't want to return to [facility] as she has been there in the past.  Pt's husband and here two grown sons are unable to care for her the way she should be and her [place] has recently stopped.  CSW will assist with placement for pt and facilitate d/c.   Assessment/plan status:  Psychosocial Support/Ongoing Assessment of Needs Other assessment/ plan:   Information/referral to community resources:    PATIENT'S/FAMILY'S RESPONSE TO PLAN OF CARE: Pt's husband sad/frustrated with pt's condition and not being able to care for her properly.  While he hates to put her in NH, he realizes that it is best for her.  Emtoional support offered.       Ky Barban, MSW, [facility] Clinical Social Worker [phone]

## 2014-01-26 NOTE — Progress Notes (Signed)
[facility] PROGRESS NOTE Interim History: 63 y.o. female well known to our service, she has a long and extensive history of CHF. She reports worsening L arm edema for the past several days. This has also been associated with increasing SOB on oxygen at home and orthopnea.   Assessment/Plan: Acute combined systolic and diastolic heart failure: - Change lasix to ggt and start metolazone. Pt massively overloaded. - Strict I & O's, Fluid Restrict diet, low sodium diet. - +JVD, cont coreg. - Weight 118.8 ->117 kg.  DM (diabetes mellitus), type 2 with renal complications: - Lantus 15. - Blood glucose control.  CKD (chronic kidney disease), stage IV - Cont lasix. - Now improved.   Code Status: Full  Family Communication: No family in room  Disposition Plan: Inpatient   Consultants:  none  Procedures:  none  Antibiotics:  None  HPI/Subjective: SOB improved.  Objective: Filed Vitals:   01/25/14 2000 01/25/14 2019 01/26/14 0029 01/26/14 0603  BP: 160/57  154/49 125/37  Pulse: 57  62 54  Temp:   97.9 F (36.6 C) 98 F (36.7 C)  TempSrc:   Oral Oral  Resp:   18 18  Height:   5\' 3"  (1.6 m)   Weight:  122.159 kg (269 lb 5 oz) 117.6 kg (259 lb 4.2 oz) 117.5 kg (259 lb 0.7 oz)  SpO2: 97%  95% 96%    Intake/Output Summary (Last 24 hours) at 01/26/14 1005 Last data filed at 01/26/14 0208  Gross per 24 hour  Intake      0 ml  Output   1430 ml  Net  -1430 ml   Filed Weights   01/25/14 2019 01/26/14 0029 01/26/14 0603  Weight: 122.159 kg (269 lb 5 oz) 117.6 kg (259 lb 4.2 oz) 117.5 kg (259 lb 0.7 oz)    Exam:  General: Alert, awake, oriented x3, in no acute distress.  HEENT: No bruits, no goiter. +JVD Heart: Regular rate and rhythm, without murmurs, rubs, gallops.  Lungs: Good air movement,  Abdomen: Soft, nontender, nondistended, positive bowel sounds.    Data Reviewed: Basic Metabolic Panel:  Recent Labs Lab 01/25/14 1527 01/26/14 0419  NA 145  145  K 3.5* 3.6*  CL 102 102  CO2 32 33*  GLUCOSE 90 105*  BUN 46* 43*  CREATININE 2.45* 2.16*  CALCIUM 8.9 8.6   Liver Function Tests:  Recent Labs Lab 01/25/14 1527  AST 9  ALT <5  ALKPHOS 82  BILITOT 0.3  PROT 7.2  ALBUMIN 2.9*   No results found for this basename: LIPASE, AMYLASE,  in the last 168 hours No results found for this basename: AMMONIA,  in the last 168 hours CBC:  Recent Labs Lab 01/25/14 1527  WBC 5.7  NEUTROABS 4.4  HGB 8.8*  HCT 29.1*  MCV 87.9  PLT 162   Cardiac Enzymes:  Recent Labs Lab 01/25/14 2310 01/26/14 0419  TROPONINI <0.30 <0.30   BNP (last 3 results)  Recent Labs  12/07/13 0454 12/10/13 0915 01/25/14 1527  PROBNP 4262.0* 2368.0* 1266.0*   CBG:  Recent Labs Lab 01/26/14 0007 01/26/14 0553  GLUCAP 99 102*    Recent Results (from the past 240 hour(s))  SURGICAL PCR SCREEN     Status: None   Collection Time    01/26/14  1:15 AM      Result Value Ref Range Status   MRSA, PCR NEGATIVE  NEGATIVE Final   Staphylococcus aureus NEGATIVE  NEGATIVE Final   Comment:  The Xpert SA Assay (FDA     approved for NASAL specimens     in patients over 44 years of age),     is one component of     a comprehensive surveillance     program.  Test performance has     been validated by [employer] for patients greater     than or equal to 76 year old.     It is not intended     to diagnose infection nor to     guide or monitor treatment.     Studies: Dg Chest 2 View  01/25/2014   CLINICAL DATA:  Arm swelling, PICC toe pain and swelling, history hypertension, diabetes, CHF, asthma, stage IV chronic kidney disease  EXAM: CHEST  2 VIEW  COMPARISON:  12/12/2013  FINDINGS: Enlargement of cardiac silhouette with pulmonary vascular congestion.  Diffuse pulmonary infiltrates likely representing pulmonary edema and CHF.  Bibasilar effusions.  No pneumothorax.  IMPRESSION: Pulmonary edema increased since previous exam.    Electronically Signed   By: Lavonia Dana M.D.   On: 01/25/2014 16:19   Dg Foot Complete Right  01/25/2014   CLINICAL DATA:  First toe pain and swelling.  EXAM: RIGHT FOOT COMPLETE - 3+ VIEW  COMPARISON:  08/21/2006  FINDINGS: There is diffuse soft tissue swelling. There is diffuse decreased bone mineralization. There is minimal degenerative change over the first MTP joint and midfoot. There is mild erosive change along the anterior medial aspect of the navicular bone. There is a moderate size calcaneal spur inferiorly. No acute fracture or dislocation.  IMPRESSION: Moderate diffuse soft tissue swelling over the right foot. Mild erosive change along the anterior medial aspect of the navicular bone which may be due to chronic inflammatory arthropathy versus acute infection.   Electronically Signed   By: Marin Olp M.D.   On: 01/25/2014 16:14    Scheduled Meds: . allopurinol  100 mg Oral Daily  . ALPRAZolam  0.25 mg Oral QHS  . amiodarone  200 mg Oral Daily  . amLODipine  10 mg Oral Daily  . atorvastatin  20 mg Oral QHS  . furosemide  80 mg Intravenous BID  . heparin  5,000 Units Subcutaneous 3 times per day  . hydrALAZINE  125 mg Oral TID  . insulin glargine  15 Units Subcutaneous BID  . isosorbide dinitrate  10 mg Oral BID  . metoCLOPramide  5 mg Oral TID  . metoprolol tartrate  12.5 mg Oral BID  . pantoprazole  40 mg Oral Daily  . polyethylene glycol  17 g Oral BID  . sodium chloride  3 mL Intravenous Q12H  . traZODone  25 mg Oral QHS   Continuous Infusions:    Charlynne Cousins  [facility] Pager [phone].  If 8PM-8AM, please contact night-coverage at [email or web address], password [facility] [place] 01/26/2014, 10:05 AM  LOS: 1 day

## 2014-01-27 DIAGNOSIS — G4733 Obstructive sleep apnea (adult) (pediatric): Secondary | ICD-10-CM

## 2014-01-27 DIAGNOSIS — D638 Anemia in other chronic diseases classified elsewhere: Secondary | ICD-10-CM

## 2014-01-27 DIAGNOSIS — I4891 Unspecified atrial fibrillation: Secondary | ICD-10-CM

## 2014-01-27 LAB — URINE CULTURE: Colony Count: >=100000

## 2014-01-27 LAB — BASIC METABOLIC PANEL
BUN: 44 mg/dL — ABNORMAL HIGH (ref 6–23)
CO2: 34 meq/L — AB (ref 19–32)
CREATININE: 2.2 mg/dL — AB (ref 0.50–1.10)
Calcium: 8.7 mg/dL (ref 8.4–10.5)
Chloride: 99 mEq/L (ref 96–112)
GFR calc Af Amer: 26 mL/min — ABNORMAL LOW (ref >90)
GFR calc non Af Amer: 23 mL/min — ABNORMAL LOW (ref >90)
Glucose, Bld: 73 mg/dL (ref 70–99)
Potassium: 3.9 mEq/L (ref 3.7–5.3)
Sodium: 142 mEq/L (ref 137–147)

## 2014-01-27 LAB — GLUCOSE, CAPILLARY
Glucose-Capillary: 81 mg/dL (ref 70–99)
Glucose-Capillary: 84 mg/dL (ref 70–99)

## 2014-01-27 MED ORDER — DULOXETINE HCL 20 MG PO CPEP
20.0000 mg | ORAL_CAPSULE | Freq: Every day | ORAL | Status: DC
  Administered 2014-01-27 – 2014-01-28 (×2): 20 mg via ORAL
  Filled 2014-01-27 (×2): qty 1

## 2014-01-27 MED ORDER — FUROSEMIDE 80 MG PO TABS
80.0000 mg | ORAL_TABLET | Freq: Two times a day (BID) | ORAL | Status: DC
Start: 2014-01-27 — End: 2014-01-28
  Administered 2014-01-27 – 2014-01-28 (×3): 80 mg via ORAL
  Filled 2014-01-27 (×5): qty 1

## 2014-01-27 NOTE — Plan of Care (Signed)
Problem: Phase I Progression Outcomes Goal: EF % per last Echo/documented,Core Reminder form on chart Outcome: Completed/Met Date Met:  01/27/14 EF 50-55% per ECHO performed on 10/25/13.

## 2014-01-27 NOTE — Evaluation (Signed)
Physical Therapy Evaluation Patient Details Name: [name] MRN: [id number] DOB: 1951-07-26 Today's Date: 01/27/2014 Time: 7829-5621 PT Time Calculation (min): 24 min  PT Assessment / Plan / Recommendation History of Present Illness  63 y.o. female well known to our service, she has a long and extensive history of CHF. She reports worsening L arm edema for the past several days. This has also been associated with increasing SOB on oxygen at home and orthopnea.   Clinical Impression  Pt presents with deficits in strength and mobility, will benefit from skilled PT services to decrease burden of care for safe d/c to SNF.    PT Assessment  Patient needs continued PT services    Follow Up Recommendations  SNF    Does the patient have the potential to tolerate intense rehabilitation      Barriers to Discharge        Equipment Recommendations  None recommended by PT    Recommendations for Other Services     Frequency Min 3X/week    Precautions / Restrictions Restrictions Weight Bearing Restrictions: No   Pertinent Vitals/Pain Pt c/o L knee pain with AAROM, eases with rest. spO2 92% during mobility      Mobility  Bed Mobility Overal bed mobility: Needs Assistance Bed Mobility: Supine to Sit;Sit to Supine Supine to sit: Max assist Sit to supine: Max assist General bed mobility comments: assist for LEs and trunk, pt requires total A for scooting up in bed    Exercises General Exercises - Lower Extremity Ankle Circles/Pumps: AROM;15 reps;Both Long Arc Quad: AROM;AAROM;Both;10 reps Hip ABduction/ADduction: AROM;Both;10 reps Hip Flexion/Marching: AROM;AAROM;Both;10 reps Shoulder Exercises Shoulder Flexion: AROM;10 reps;Both Shoulder ABduction: AROM;10 reps;Both   PT Diagnosis: Generalized weakness  PT Problem List: Decreased strength;Decreased mobility;Decreased activity tolerance;Decreased balance;Decreased knowledge of use of DME;Decreased range of motion PT  Treatment Interventions: DME instruction;Therapeutic exercise;Balance training;Gait training;Stair training;Functional mobility training;Therapeutic activities;Patient/family education;Neuromuscular re-education;Modalities;Wheelchair mobility training     PT Goals(Current goals can be found in the care plan section) Acute Rehab PT Goals Patient Stated Goal: go home PT Goal Formulation: With patient/family Time For Goal Achievement: 02/03/14 Potential to Achieve Goals: Good  Visit Information  Last PT Received On: 01/27/14 Assistance Needed: +1 History of Present Illness: 63 y.o. female well known to our service, she has a long and extensive history of CHF. She reports worsening L arm edema for the past several days. This has also been associated with increasing SOB on oxygen at home and orthopnea.        Prior [place] expects to be discharged to:: Skilled nursing facility Additional Comments: wants to return home eventually Prior Function Level of Independence: Needs assistance Comments: pt's husband reports pt was total care at home Communication Communication: No difficulties    Cognition  Cognition Arousal/Alertness: Awake/alert Behavior During Therapy: WFL for tasks assessed/performed Overall Cognitive Status: Within Functional Limits for tasks assessed    Extremity/Trunk Assessment Upper Extremity Assessment Upper Extremity Assessment: Generalized weakness Lower Extremity Assessment Lower Extremity Assessment: Generalized weakness (R LE 2+/5, L LE 2-/5 limited by pain and weakness) Cervical / Trunk Assessment Cervical / Trunk Assessment: Normal (obese)   Balance General Comments General comments (skin integrity, edema, etc.): pt performed sitting balance edge of bed x 10 minutes with 1 UE support with supervision  End of Session PT - End of Session Equipment Utilized During Treatment: Oxygen Activity Tolerance: Patient tolerated  treatment well Patient left: in bed;with call bell/phone within reach;with family/visitor present  Nurse Communication: Mobility status  GP     Degan Hanser 01/27/2014, 9:42 AM

## 2014-01-27 NOTE — Progress Notes (Signed)
[facility] PROGRESS NOTE Interim History: 63 y.o. female well known to our service, she has a long and extensive history of CHF. She reports worsening L arm edema for the past several days. This has also been associated with increasing SOB on oxygen at home and orthopnea.   Assessment/Plan: Acute combined systolic and diastolic heart failure: - Change lasix orals.  - Strict I & O's, Fluid Restrict diet, low sodium diet. - +JVD, cont coreg. - Weight 118.8 ->117-> 116 kg.  DM (diabetes mellitus), type 2 with renal complications: - Lantus 15. - Blood glucose control.  Depression: - anhedonia with early morning waking up. - start cymbalta. - re-evaluate in 6 weeks. Increase as needed. - d/c trazodone.  CKD (chronic kidney disease), stage IV - Cont lasix. - Now improved.   Decondition: - PT evaluation pending.  Code Status: Full  Family Communication: No family in room  Disposition Plan: Inpatient   Consultants:  none  Procedures:  none  Antibiotics:  None  HPI/Subjective: Feels better  Objective: Filed Vitals:   01/26/14 1658 01/26/14 2218 01/27/14 0152 01/27/14 0611  BP: 117/41 128/44 137/48 136/41  Pulse: 50 51 56 55  Temp:  98.2 F (36.8 C) 98.2 F (36.8 C) 98 F (36.7 C)  TempSrc:  Oral Oral Oral  Resp:  18 18 18   Height:      Weight:    116 kg (255 lb 11.7 oz)  SpO2:  95% 95% 97%    Intake/Output Summary (Last 24 hours) at 01/27/14 0723 Last data filed at 01/27/14 0612  Gross per 24 hour  Intake    603 ml  Output   2751 ml  Net  -2148 ml   Filed Weights   01/26/14 0029 01/26/14 0603 01/27/14 0611  Weight: 117.6 kg (259 lb 4.2 oz) 117.5 kg (259 lb 0.7 oz) 116 kg (255 lb 11.7 oz)    Exam:  General: Alert, awake, oriented x3, in no acute distress.  HEENT: No bruits, no goiter. - JVD Heart: Regular rate and rhythm, without murmurs, rubs, gallops.  Lungs: Good air movement,  Abdomen: Soft, nontender, nondistended, positive bowel  sounds.    Data Reviewed: Basic Metabolic Panel:  Recent Labs Lab 01/25/14 1527 01/26/14 0419 01/27/14 0440  NA 145 145 142  K 3.5* 3.6* 3.9  CL 102 102 99  CO2 32 33* 34*  GLUCOSE 90 105* 73  BUN 46* 43* 44*  CREATININE 2.45* 2.16* 2.20*  CALCIUM 8.9 8.6 8.7   Liver Function Tests:  Recent Labs Lab 01/25/14 1527  AST 9  ALT <5  ALKPHOS 82  BILITOT 0.3  PROT 7.2  ALBUMIN 2.9*   No results found for this basename: LIPASE, AMYLASE,  in the last 168 hours No results found for this basename: AMMONIA,  in the last 168 hours CBC:  Recent Labs Lab 01/25/14 1527  WBC 5.7  NEUTROABS 4.4  HGB 8.8*  HCT 29.1*  MCV 87.9  PLT 162   Cardiac Enzymes:  Recent Labs Lab 01/25/14 2310 01/26/14 0419 01/26/14 0922  TROPONINI <0.30 <0.30 <0.30   BNP (last 3 results)  Recent Labs  12/07/13 0454 12/10/13 0915 01/25/14 1527  PROBNP 4262.0* 2368.0* 1266.0*   CBG:  Recent Labs Lab 01/26/14 0007 01/26/14 0553 01/26/14 2215 01/27/14 0604  GLUCAP 99 102* 103* 81    Recent Results (from the past 240 hour(s))  URINE CULTURE     Status: None   Collection Time    01/25/14  3:23 PM  Result Value Ref Range Status   Specimen Description URINE, CATHETERIZED   Final   Special Requests NONE   Final   Culture  Setup Time     Final   Value: 01/25/2014 22:55     Performed at [facility] Count     Final   Value: >=100,000 COLONIES/ML     Performed at [facility]   Culture     Final   Value: Multiple bacterial morphotypes present, none predominant. Suggest appropriate recollection if clinically indicated.     Performed at [facility]   Report Status 01/27/2014 FINAL   Final  SURGICAL PCR SCREEN     Status: None   Collection Time    01/26/14  1:15 AM      Result Value Ref Range Status   MRSA, PCR NEGATIVE  NEGATIVE Final   Staphylococcus aureus NEGATIVE  NEGATIVE Final   Comment:            The Xpert SA Assay (FDA      approved for NASAL specimens     in patients over 13 years of age),     is one component of     a comprehensive surveillance     program.  Test performance has     been validated by [employer] for patients greater     than or equal to 22 year old.     It is not intended     to diagnose infection nor to     guide or monitor treatment.     Studies: Dg Chest 2 View  01/25/2014   CLINICAL DATA:  Arm swelling, PICC toe pain and swelling, history hypertension, diabetes, CHF, asthma, stage IV chronic kidney disease  EXAM: CHEST  2 VIEW  COMPARISON:  12/12/2013  FINDINGS: Enlargement of cardiac silhouette with pulmonary vascular congestion.  Diffuse pulmonary infiltrates likely representing pulmonary edema and CHF.  Bibasilar effusions.  No pneumothorax.  IMPRESSION: Pulmonary edema increased since previous exam.   Electronically Signed   By: Lavonia Dana M.D.   On: 01/25/2014 16:19   Dg Foot Complete Right  01/25/2014   CLINICAL DATA:  First toe pain and swelling.  EXAM: RIGHT FOOT COMPLETE - 3+ VIEW  COMPARISON:  08/21/2006  FINDINGS: There is diffuse soft tissue swelling. There is diffuse decreased bone mineralization. There is minimal degenerative change over the first MTP joint and midfoot. There is mild erosive change along the anterior medial aspect of the navicular bone. There is a moderate size calcaneal spur inferiorly. No acute fracture or dislocation.  IMPRESSION: Moderate diffuse soft tissue swelling over the right foot. Mild erosive change along the anterior medial aspect of the navicular bone which may be due to chronic inflammatory arthropathy versus acute infection.   Electronically Signed   By: Marin Olp M.D.   On: 01/25/2014 16:14    Scheduled Meds: . allopurinol  100 mg Oral Daily  . ALPRAZolam  0.25 mg Oral QHS  . amiodarone  200 mg Oral Daily  . amLODipine  10 mg Oral Daily  . atorvastatin  20 mg Oral QHS  . furosemide  80 mg Oral BID  . heparin  5,000 Units  Subcutaneous 3 times per day  . hydrALAZINE  125 mg Oral TID  . insulin glargine  15 Units Subcutaneous BID  . isosorbide dinitrate  10 mg Oral BID  . metoCLOPramide  5 mg Oral TID  . metoprolol  tartrate  12.5 mg Oral BID  . pantoprazole  40 mg Oral Daily  . polyethylene glycol  17 g Oral BID  . sodium chloride  3 mL Intravenous Q12H  . traZODone  25 mg Oral QHS   Continuous Infusions:    Charlynne Cousins  [facility] Pager [phone].  If 8PM-8AM, please contact night-coverage at [email or web address], password [facility] 01/27/2014, 7:23 AM  LOS: 2 days

## 2014-01-27 NOTE — Progress Notes (Signed)
Patient alert and oriented x4 throughout shift.  Vital signs stable.  Husband at bedside this morning.  Patient started on antidepressant this morning.  Patient and husband state they have no questions or concerns at this time.

## 2014-01-28 DIAGNOSIS — J9600 Acute respiratory failure, unspecified whether with hypoxia or hypercapnia: Secondary | ICD-10-CM

## 2014-01-28 LAB — GLUCOSE, CAPILLARY
GLUCOSE-CAPILLARY: 66 mg/dL — AB (ref 70–99)
Glucose-Capillary: 109 mg/dL — ABNORMAL HIGH (ref 70–99)

## 2014-01-28 LAB — BASIC METABOLIC PANEL
BUN: 45 mg/dL — ABNORMAL HIGH (ref 6–23)
CO2: 38 meq/L — AB (ref 19–32)
Calcium: 9.1 mg/dL (ref 8.4–10.5)
Chloride: 97 mEq/L (ref 96–112)
Creatinine, Ser: 2.34 mg/dL — ABNORMAL HIGH (ref 0.50–1.10)
GFR calc Af Amer: 24 mL/min — ABNORMAL LOW (ref >90)
GFR calc non Af Amer: 21 mL/min — ABNORMAL LOW (ref >90)
Glucose, Bld: 70 mg/dL (ref 70–99)
Potassium: 3.9 mEq/L (ref 3.7–5.3)
SODIUM: 144 meq/L (ref 137–147)

## 2014-01-28 MED ORDER — GLUCOSE 40 % PO GEL
ORAL | Status: AC
  Filled 2014-01-28: qty 1

## 2014-01-28 MED ORDER — ISOSORBIDE DINITRATE 30 MG PO TABS: 30.0000 mg | ORAL_TABLET | Freq: Two times a day (BID) | ORAL | Status: AC

## 2014-01-28 MED ORDER — ISOSORBIDE DINITRATE 30 MG PO TABS
30.0000 mg | ORAL_TABLET | Freq: Two times a day (BID) | ORAL | Status: DC
  Administered 2014-01-28: 30 mg via ORAL
  Filled 2014-01-28 (×2): qty 1

## 2014-01-28 MED ORDER — GLUCOSE 40 % PO GEL
1.0000 | ORAL | Status: DC | PRN
  Administered 2014-01-28: 37.5 g via ORAL

## 2014-01-28 NOTE — Progress Notes (Signed)
Hypoglycemic Event  CBG: 66  Treatment: 1 tube instant glucose  Symptoms: None  Follow-up CBG: Time: 6553 CBG Result: 109  Possible Reasons for Event: Inadequate meal intake  Comments/MD notified: Dr. Leighton Roach C  Remember to initiate Hypoglycemia Order Set & complete

## 2014-01-28 NOTE — Discharge Summary (Signed)
Physician Discharge Summary  [name] DDU:[id number] DOB: [birth year] DOA: 01/25/2014  PCP: Garwin Brothers, MD  Admit date: 01/25/2014 Discharge date: 01/28/2014  Time spent: 35 minutes  Recommendations for Outpatient Follow-up:  1. Follow up with the heart failure clinic in 1 week, check a b-met at that time. 2. Follow up with cardiologist in 2 weeks titrate antihypertensive medications  Discharge Diagnoses:  Active Problems:   Acute combined systolic and diastolic heart failure   DM (diabetes mellitus), type 2 with renal complications   CKD (chronic kidney disease), stage IV   Chronic combined systolic and diastolic CHF (congestive heart failure)   CHF (congestive heart failure)   Acute systolic heart failure   Discharge Condition: stable Diet recommendation: low sodium   Filed Weights   01/26/14 0603 01/27/14 0611 01/28/14 0552  Weight: 117.5 kg (259 lb 0.7 oz) 116 kg (255 lb 11.7 oz) 115.4 kg (254 lb 6.6 oz)    History of present illness:  63 y.o. [name] well known to our service, she has a long and extensive history of CHF. She reports worsening L arm edema for the past several days. This has also been associated with increasing SOB on oxygen at home and orthopnea. Usual medications including lasix 33m BID PO at home have provided no relief.  Hospital Course:  Acute combined systolic and diastolic heart failure:  - Started IV lasix and diuresis,  Change lasix orals once diuresis. - Strict I & O's, monitor electrolytes and repleted. - Cont coreg.  - Weight 118.8 ->117-> 115.4 kg.   DM (diabetes mellitus), type 2 with renal complications:  - No changes made.  Newly diagnose Depression:  - Anhedonia with early morning waking up.  - Start cymbalta.  - Re-evaluate in 6 weeks. Increase as needed.   CKD (chronic kidney disease), stage IV  - Now improved with diuresis.   Decondition:  - PT evaluation  pending.   Procedures:  none  Consultations:  none  Discharge Exam: Filed Vitals:   01/28/14 0951  BP: 143/33  Pulse: 56  Temp:   Resp: 18    General: A&O x3 Cardiovascular: RRR Respiratory: good air movement CTA B/L  Discharge Instructions      Discharge Orders   Future Appointments Provider Department Dept Phone   02/07/2014 10:00 AM [phone]  Future Orders Complete By Expires   Diet - low sodium heart healthy  As directed    Increase activity slowly  As directed        Medication List         albuterol 108 (90 BASE) MCG/ACT inhaler  Commonly known as:  PROVENTIL HFA;VENTOLIN HFA  Inhale 2 puffs into the lungs every 6 (six) hours as needed for wheezing or shortness of breath.     albuterol (2.5 MG/3ML) 0.083% nebulizer solution  Commonly known as:  PROVENTIL  Take 2.5 mg by nebulization every 6 (six) hours as needed for wheezing or shortness of breath.     allopurinol 100 MG tablet  Commonly known as:  ZYLOPRIM  Take 100 mg by mouth daily. Patient takes at 0800am daily     ALPRAZolam 0.25 MG tablet  Commonly known as:  XANAX  Take 1 tablet (0.25 mg total) by mouth at bedtime.     amiodarone 200 MG tablet  Commonly known as:  PACERONE  Take 200 mg by mouth daily.     amLODipine 10 MG tablet  Commonly  known as:  NORVASC  Take 10 mg by mouth daily. Patient takes at 0800am daily     atorvastatin 20 MG tablet  Commonly known as:  LIPITOR  Take 20 mg by mouth at bedtime. Patient takes at 2100     furosemide 80 MG tablet  Commonly known as:  LASIX  Take 1 tablet (80 mg total) by mouth 2 (two) times daily.     hydrALAZINE 25 MG tablet  Commonly known as:  APRESOLINE  Take 5 tablets (125 mg total) by mouth 3 (three) times daily.     insulin glargine 100 UNIT/ML injection  Commonly known as:  LANTUS  Inject 15 Units into the skin 2 (two) times daily.     isosorbide  dinitrate 30 MG tablet  Commonly known as:  ISORDIL  Take 1 tablet (30 mg total) by mouth 2 (two) times daily.     metoCLOPramide 5 MG tablet  Commonly known as:  REGLAN  Take 5 mg by mouth 3 (three) times daily.     metoprolol tartrate 25 MG tablet  Commonly known as:  LOPRESSOR  Take 12.5 mg by mouth 2 (two) times daily.     omeprazole 20 MG capsule  Commonly known as:  PRILOSEC  Take 20 mg by mouth daily.     oxyCODONE-acetaminophen 5-325 MG per tablet  Commonly known as:  PERCOCET/ROXICET  Take 2 tablets by mouth every 6 (six) hours as needed for severe pain.     pantoprazole 40 MG tablet  Commonly known as:  PROTONIX  Take 40 mg by mouth daily.     polyethylene glycol packet  Commonly known as:  MIRALAX / GLYCOLAX  Take 17 g by mouth 2 (two) times daily.     traZODone 50 MG tablet  Commonly known as:  DESYREL  Take 25 mg by mouth at bedtime.       Allergies  Allergen Reactions  . Daptomycin Other (See Comments)    Elevated CPK  . Morphine And Related Nausea And Vomiting  . Cephalexin Rash  . Tramadol Nausea And Vomiting  . Celecoxib Other (See Comments)    Unknown, can use furosemide without issue  . Codeine Other (See Comments)    unknown  . Fluoxetine Hcl Other (See Comments)    unknown  . Latex Other (See Comments)  . Ofloxacin Other (See Comments)    unknown  . Penicillins Other (See Comments)    unknown  . Rofecoxib Other (See Comments)    unknown  . Sulfonamide Derivatives Other (See Comments)    unknown   Follow-up Information   Follow up with [facility] On 02/04/2014. (_0 :00 am Gate code 0300 [facility] )    Specialty:  Cardiology   Contact information:   [address] [id number] [place] [address] [phone]      Follow up In 1 week. (hospital follow up)        The results of significant diagnostics from this hospitalization (including imaging, microbiology,  ancillary and laboratory) are listed below for reference.    Significant Diagnostic Studies: Dg Chest 2 View  01/25/2014   CLINICAL DATA:  Arm swelling, PICC toe pain and swelling, history hypertension, diabetes, CHF, asthma, stage IV chronic kidney disease  EXAM: CHEST  2 VIEW  COMPARISON:  12/12/2013  FINDINGS: Enlargement of cardiac silhouette with pulmonary vascular congestion.  Diffuse pulmonary infiltrates likely representing pulmonary edema and CHF.  Bibasilar effusions.  No pneumothorax.  IMPRESSION: Pulmonary edema increased since previous exam.   Electronically Signed   By: Lavonia Dana M.D.   On: 01/25/2014 16:19   Dg Foot Complete Right  01/25/2014   CLINICAL DATA:  First toe pain and swelling.  EXAM: RIGHT FOOT COMPLETE - 3+ VIEW  COMPARISON:  08/21/2006  FINDINGS: There is diffuse soft tissue swelling. There is diffuse decreased bone mineralization. There is minimal degenerative change over the first MTP joint and midfoot. There is mild erosive change along the anterior medial aspect of the navicular bone. There is a moderate size calcaneal spur inferiorly. No acute fracture or dislocation.  IMPRESSION: Moderate diffuse soft tissue swelling over the right foot. Mild erosive change along the anterior medial aspect of the navicular bone which may be due to chronic inflammatory arthropathy versus acute infection.   Electronically Signed   By: Marin Olp M.D.   On: 01/25/2014 16:14    Microbiology: Recent Results (from the past 240 hour(s))  URINE CULTURE     Status: None   Collection Time    01/25/14  3:23 PM      Result Value Ref Range Status   Specimen Description URINE, CATHETERIZED   Final   Special Requests NONE   Final   Culture  Setup Time     Final   Value: 01/25/2014 22:55     Performed at [facility]     Final   Value: >=100,000 COLONIES/ML     Performed at [facility]   Culture     Final   Value: Multiple bacterial morphotypes present,  none predominant. Suggest appropriate recollection if clinically indicated.     Performed at [facility]   Report Status 01/27/2014 FINAL   Final  SURGICAL PCR SCREEN     Status: None   Collection Time    01/26/14  1:15 AM      Result Value Ref Range Status   MRSA, PCR NEGATIVE  NEGATIVE Final   Staphylococcus aureus NEGATIVE  NEGATIVE Final   Comment:            The Xpert SA Assay (FDA     approved for NASAL specimens     in patients over 49 years of age),     is one component of     a comprehensive surveillance     program.  Test performance has     been validated by [employer] for patients greater     than or equal to 12 year old.     It is not intended     to diagnose infection nor to     guide or monitor treatment.     Labs: Basic Metabolic Panel:  Recent Labs Lab 01/25/14 1527 01/26/14 0419 01/27/14 0440 01/28/14 0520  NA 145 145 142 144  K 3.5* 3.6* 3.9 3.9  CL 102 102 99 97  CO2 32 33* 34* 38*  GLUCOSE 90 105* 73 70  BUN 46* 43* 44* 45*  CREATININE 2.45* 2.16* 2.20* 2.34*  CALCIUM 8.9 8.6 8.7 9.1   Liver Function Tests:  Recent Labs Lab 01/25/14 1527  AST 9  ALT <5  ALKPHOS 82  BILITOT 0.3  PROT 7.2  ALBUMIN 2.9*   No results found for this basename: LIPASE, AMYLASE,  in the last 168 hours No results found for this basename: AMMONIA,  in the last 168 hours CBC:  Recent Labs Lab 01/25/14 1527  WBC  5.7  NEUTROABS 4.4  HGB 8.8*  HCT 29.1*  MCV 87.9  PLT 162   Cardiac Enzymes:  Recent Labs Lab 01/25/14 2310 01/26/14 0419 01/26/14 0922  TROPONINI <0.30 <0.30 <0.30   BNP: BNP (last 3 results)  Recent Labs  12/07/13 0454 12/10/13 0915 01/25/14 1527  PROBNP 4262.0* 2368.0* 1266.0*   CBG:  Recent Labs Lab 01/26/14 2215 01/27/14 0604 01/27/14 2211 01/28/14 0718 01/28/14 0835  GLUCAP 103* 81 84 66* 109*       Signed:  Charlynne Cousins  [facility] 01/28/2014, 10:38 AM

## 2014-01-28 NOTE — Progress Notes (Signed)
Patient alert and oriented x4.  Patient being discharged to [facility].  IV removed prior to discharge; IV site clean, dry, and intact.  Report called to [facility], Therapist, sports at [facility].  Patient being transported via ambulance.  Husband aware.  Will continue to monitor.

## 2014-01-28 NOTE — Progress Notes (Signed)
Received request from staff nurse to discuss NTSP change of physician notification letter with patient's spouse.  Spoke with [place] who has addressed the pateint letter with her husband at length this am.  Will request Liaison to follow up with [name] to provide further clarifications.  Of note, [facility] Care Management services does not replace or interfere with any services that are arranged by inpatient case management or social work.  For additional questions or referrals please contact Corliss Blacker BSN RN [facility] Liaison at [phone].

## 2014-01-30 ENCOUNTER — Non-Acute Institutional Stay (SKILLED_NURSING_FACILITY): Payer: Medicare HMO | Admitting: Internal Medicine

## 2014-01-30 DIAGNOSIS — I509 Heart failure, unspecified: Secondary | ICD-10-CM

## 2014-01-30 DIAGNOSIS — N184 Chronic kidney disease, stage 4 (severe): Secondary | ICD-10-CM

## 2014-01-30 DIAGNOSIS — I5041 Acute combined systolic (congestive) and diastolic (congestive) heart failure: Secondary | ICD-10-CM

## 2014-01-30 DIAGNOSIS — E1165 Type 2 diabetes mellitus with hyperglycemia: Secondary | ICD-10-CM

## 2014-01-30 DIAGNOSIS — E1129 Type 2 diabetes mellitus with other diabetic kidney complication: Secondary | ICD-10-CM

## 2014-01-30 NOTE — Progress Notes (Signed)
Patient ID: [name], female   DOB: Nov 09, 1951, 63 y.o.   MRN: [id number]  facility; [facility] Chief complaint admission to SNF post [facility] 2/17 through 2/20 History; this patient with known systolic and diastolic heart failure. She was diuresis. Discharged on Lasix 80 mg twice a day. I'm not completely certain how this compares with preadmission diuretic doses. She has been sent here for rehabilitation. He was also felt to be depressed in the hospital and started on Cymbalta although I don't see this on her discharge medications  Past Medical History  Diagnosis Date  . Asthma   . Bronchitis   . Allergic rhinitis   . HTN (hypertension)   . Hyperlipidemia   . Obesity   . OSA (obstructive sleep apnea)     poor compliance with cpap  . CHF (congestive heart failure)   . Biventricular failure     compensated  . Psoriasis   . Stasis edema     of lower extremities  . Depression   . Situational stress   . Anemia   . Gout   . Chronic anticoagulation     off due to GIB, thigh hematoma  . Yeast infection involving the vagina and surrounding area   . Atrial fibrillation with RVR   . GERD (gastroesophageal reflux disease)   . Chronic bronchitis     "get it q yr" (08/10/2013)  . Pneumonia     "said I did on 08/03/2013 then they ruled it out" (08/10/2013)  . Shortness of breath     "all the time" (08/10/2013)  . On home oxygen therapy     "2L during the night and prn during the day" (08/10/2013)  . Type II diabetes mellitus   . History of blood transfusion     "few during my lifetime; last time was 4 days ago when I got 2 units" (08/10/2013)  . Stroke ?2007    denies residuals on 08/10/2013  . DJD (degenerative joint disease)   . Bleeding     "from my skin folds; just won't stop" (08/10/2013)  . Complication of anesthesia     "they gave me too much; I stayed out of it for awhile" (08/10/2013)  . Anxiety   . Kidney stones   . Kidney disease, chronic, stage III (GFR 30-59 ml/min)    . Chronic kidney disease, stage IV (severe)   . Morbid obesity    Past Surgical History  Procedure Laterality Date  . Appendectomy    . Cholecystectomy    . Total knee arthroplasty Left ~ 2006  . Total abdominal hysterectomy    . Back surgery    . Cystectomy Left     hand  . Tonsillectomy    . Joint replacement    . Lumbar disc surgery    . Colonoscopy N/A 08/16/2013    Procedure: COLONOSCOPY;  Surgeon: Beryle Beams, MD;  Location: [facility];  Service: Endoscopy;  Laterality: N/A;  . Hematoma evacuation Right 09/10/2013    Procedure: EVACUATION OF RIGHT LEG HEMATOMA AND EXCISION [place] RIGHT LEG ;  Surgeon: Merrie Roof, MD;  Location: [facility];  Service: General;  Laterality: Right;  . Debridement and closure wound Right 09/22/2013    Procedure: IRRIGATION AND DEBRIDEMENT SURGICAL PREP PLACEMENT OF [place]  ;  Surgeon: Irene Limbo, MD;  Location: [facility];  Service: Plastics;  Laterality: Right;  . Skin split graft Right 10/07/2013    Procedure: SPLIT THICKNESS SKIN GRAFT RIGHT  THIGH TO RIGHT LEG, PLACEMENT OF A CEL AND WOUND VAC TO RIGHT THIGH  ;  Surgeon: Irene Limbo, MD;  Location: [facility];  Service: Plastics;  Laterality: Right;   Current Outpatient Prescriptions on File Prior to Visit  Medication Sig Dispense Refill  . albuterol (PROVENTIL HFA;VENTOLIN HFA) 108 (90 BASE) MCG/ACT inhaler Inhale 2 puffs into the lungs every 6 (six) hours as needed for wheezing or shortness of breath.       [name] albuterol (PROVENTIL) (2.5 MG/3ML) 0.083% nebulizer solution Take 2.5 mg by nebulization every 6 (six) hours as needed for wheezing or shortness of breath.      . allopurinol (ZYLOPRIM) 100 MG tablet Take 100 mg by mouth daily. Patient takes at 0800am daily      . ALPRAZolam (XANAX) 0.25 MG tablet Take 1 tablet (0.25 mg total) by mouth at bedtime.  30 tablet  0  . amiodarone (PACERONE) 200 MG tablet Take 200 mg by mouth daily.       [name] amLODipine (NORVASC) 10 MG tablet  Take 10 mg by mouth daily. Patient takes at 0800am daily      . atorvastatin (LIPITOR) 20 MG tablet Take 20 mg by mouth at bedtime. Patient takes at 2100      . furosemide (LASIX) 80 MG tablet Take 1 tablet (80 mg total) by mouth 2 (two) times daily.      . hydrALAZINE (APRESOLINE) 25 MG tablet Take 5 tablets (125 mg total) by mouth 3 (three) times daily.  90 tablet  0  . insulin glargine (LANTUS) 100 UNIT/ML injection Inject 15 Units into the skin 2 (two) times daily.      . isosorbide dinitrate (ISORDIL) 30 MG tablet Take 1 tablet (30 mg total) by mouth 2 (two) times daily.      . metoCLOPramide (REGLAN) 5 MG tablet Take 5 mg by mouth 3 (three) times daily.      . metoprolol tartrate (LOPRESSOR) 25 MG tablet Take 12.5 mg by mouth 2 (two) times daily.      [name] omeprazole (PRILOSEC) 20 MG capsule Take 20 mg by mouth daily.      [name] oxyCODONE-acetaminophen (PERCOCET/ROXICET) 5-325 MG per tablet Take 2 tablets by mouth every 6 (six) hours as needed for severe pain.  30 tablet  0  . pantoprazole (PROTONIX) 40 MG tablet Take 40 mg by mouth daily.      . polyethylene glycol (MIRALAX / GLYCOLAX) packet Take 17 g by mouth 2 (two) times daily.      . traZODone (DESYREL) 50 MG tablet Take 25 mg by mouth at bedtime.       No current facility-administered medications on file prior to visit.   Social history; patient states she lives at home with her husband and 2 sons. She is able to walk for very short distances with a walker. She has oxygen chronically at home. Her exact functional status is unclear  reports that she quit smoking about 31 years ago. Her smoking use included Cigarettes. She has a 1.44 pack-year smoking history. She has quit using smokeless tobacco. She reports that she does not drink alcohol or use illicit drugs.   reports that she quit smoking about 31 years ago. Her smoking use included Cigarettes. She has a 1.44 pack-year smoking history. She has quit using smokeless tobacco. She reports  that she does not drink alcohol or use illicit drugs.  family history includes Asthma in her father; Heart attack in her father; Heart disease in  her paternal uncle; Other in her mother; Rectal cancer in her paternal aunt.  Review of systems Respiratory; patient states she is short of breath even at rest although this isn't any different from her usual Cardiac she does not describe chest pain GI no abdominal pain. Complains of nausea and anorexia Musculoskeletal; complains of pain in her right knee secondary to remote knee replacement surgery  Physical examination Gen. the patient is awake and conversational Vitals; O2 sat is 97% on 2 L pulse 67 respirations 24 Respiratory; shallow air entry bilaterally. Pursed lip breathing Cardiac heart sounds are distant there is no S3 no murmurs JVP is not elevated Abdomen; obese no liver no spleen no tenderness Extremities; severe bilateral venous stasis previous left knee replacement. Neurologic she is antigravity strength in the right leg proximally but not the left she has some distal strength. I have a hard time believing that this lady was ambulating on this left leg  Impression/plan  #1 combined systolic and diastolic heart failure. She is now on Lasix 80 mg twice a day and appears to be compensated. She will need to be followed carefully in terms of weights #2 chronic renal insufficiency this will also need to be monitored #3 type 2 diabetes on insulin #4 significant left lower extremity weakness this apparently is chronic. I had some difficulty believing that she'll be able to ambulate on this leg #5 depression I would agree with this assessment am not quite certain what happened with the Cymbalta #6 gastroesophageal reflux disease; patient complains of chronic nausea. 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

## 2014-01-30 NOTE — Clinical Social Work Placement (Addendum)
    Clinical Social Work Department CLINICAL SOCIAL WORK PLACEMENT NOTE   Patient:  [name], [name]  Account Number:  [id number] Admit date:  01/25/2014  Clinical Social Worker:  Ky Barban, Latanya Presser  Date/time:  01/26/2014 03:07 PM  Clinical Social Work is seeking post-discharge placement for this patient at the following level of care:   SKILLED NURSING   (*CSW will update this form in Epic as items are completed)     Patient/family provided with [place] Department of Clinical Social Work's list of facilities offering this level of care within the geographic area requested by the patient (or if unable, by the patient's family).  01/26/2014  Patient/family informed of their freedom to choose among providers that offer the needed level of care, that participate in Medicare, Medicaid or managed care program needed by the patient, have an available bed and are willing to accept the patient.    Patient/family informed of [facility]' ownership interest in [facility], as well as of the fact that they are under no obligation to receive care at this facility.  PASARR submitted to EDS on  PASARR number received from [facility] on   FL2 transmitted to all facilities in geographic area requested by pt/family on  01/26/2014 FL2 transmitted to all facilities within larger geographic area on   Patient informed that his/her managed care company has contracts with or will negotiate with  certain facilities, including the following:   [employer] Medicare   Has existing PASARR     Patient/family informed of bed offers received:  01/27/2014 Patient chooses bed at [facility] Physician recommends and patient chooses bed at    Patient to be transferred to [facility] on  01/28/2014 Patient to be transferred to facility by Ambulance  Corey [facility])  The following physician request were entered in Epic:   Additional Comments: 01/28/14  Ok per MD for d/c today to  SNF. Patient is not very happy about going to SNF but was convinced by her husband that she needed to go.  He stated that he can no longer manage her care at home. Patient has been in and out of SNFs in the past and her goal has always been to go home.  She becomes quite emotional when she speaks about her past health conditions and verbalizes frustration over not getting better. " I just want to go home."  CSW provided support and encouraged patient to talk about her feelings.  She has some difficulty in expressing herself but states that she knew she had to go to the SNF after having a long talk with her husband. Mr. Zegarra spoke to CSW earlier about his feelings and frustrations- he brought her home from [facility] because she was so unhappy but realized fairly early on that he could not manage her care any further as his health is poor as well. CSW provided support to both patient and husband  DC arranged today to SNF- nursing notified to call report. [employer] auth in place.  CSW signing off.  Lorie Phenix. [place], [place]

## 2014-01-31 ENCOUNTER — Other Ambulatory Visit: Payer: Self-pay | Admitting: *Deleted

## 2014-01-31 MED ORDER — ALPRAZOLAM 0.25 MG PO TABS: ORAL_TABLET | ORAL | Status: DC

## 2014-01-31 MED ORDER — OXYCODONE-ACETAMINOPHEN 5-325 MG PO TABS: ORAL_TABLET | ORAL | Status: DC

## 2014-01-31 NOTE — Telephone Encounter (Signed)
[facility] 

## 2014-02-04 ENCOUNTER — Ambulatory Visit (HOSPITAL_COMMUNITY)
Admission: RE | Admit: 2014-02-04 | Discharge: 2014-02-04 | Disposition: A | Discharge status: Home / Self Care | Payer: Medicare HMO | Source: Ambulatory Visit | Attending: Internal Medicine | Admitting: Internal Medicine

## 2014-02-04 ENCOUNTER — Encounter (HOSPITAL_COMMUNITY): Payer: Self-pay

## 2014-02-04 VITALS — BP 122/64 | HR 48

## 2014-02-04 DIAGNOSIS — I4891 Unspecified atrial fibrillation: Secondary | ICD-10-CM | POA: Insufficient documentation

## 2014-02-04 DIAGNOSIS — I10 Essential (primary) hypertension: Secondary | ICD-10-CM | POA: Insufficient documentation

## 2014-02-04 DIAGNOSIS — I480 Paroxysmal atrial fibrillation: Secondary | ICD-10-CM

## 2014-02-04 DIAGNOSIS — E662 Morbid (severe) obesity with alveolar hypoventilation: Secondary | ICD-10-CM | POA: Insufficient documentation

## 2014-02-04 DIAGNOSIS — I509 Heart failure, unspecified: Secondary | ICD-10-CM

## 2014-02-04 DIAGNOSIS — I5032 Chronic diastolic (congestive) heart failure: Secondary | ICD-10-CM | POA: Insufficient documentation

## 2014-02-04 MED ORDER — TORSEMIDE 20 MG PO TABS: 80.0000 mg | ORAL_TABLET | Freq: Every day | ORAL | Status: DC

## 2014-02-04 NOTE — Patient Instructions (Signed)
Stop Furosemide   Start Torsemide 80 mg daily  Follow up in 2 weeks with labs

## 2014-02-05 DIAGNOSIS — E662 Morbid (severe) obesity with alveolar hypoventilation: Secondary | ICD-10-CM | POA: Insufficient documentation

## 2014-02-05 DIAGNOSIS — I5032 Chronic diastolic (congestive) heart failure: Secondary | ICD-10-CM | POA: Insufficient documentation

## 2014-02-05 NOTE — Progress Notes (Signed)
Patient ID: [name], female   DOB: 1951-10-24, 63 y.o.   MRN: [id number] PCP: Dr. Dellia Nims  63 yo with chronic diastolic CHF, CKD, and OHS/OSA on home oxygen presents for cardiology evaluation.  She has a long history of diastolic CHF.  Last echo in 11/14 showed EF 50-55%.  She has had multiple CHF admissions over the last year.  Most recently, in 2/15, she was admitted to [facility] with a diastolic CHF exacerbation.  She was diuresed and discharged to a nursing home.  She is not very mobile.  She walks short distances with a walker but gets short of breath just walking around a room.  No dyspnea at rest.  No chest pain.  +orthopnea, no PND.  No syncope or palpitations. She has a history of paroxysmal atrial fibrillation but is in NSR today.  She has not been on anticoagulation due to history of GI bleed and severe hematoma of right leg requiring surgical evacuation.   ECG: NSR, T wave flatttening diffusely.   Labs (2/15): K 3.9, creatinine 2.34, LFTs normal  PMH: 1. Diastolic CHF: Echo (32/44) with EF 50-55%, mild LVH, mild AI.  2. Morbid obesity 3. H/o left TKR 4. OSA/OHS: On oxygen for > 1 year.  5. LHC in 10/05 with nonobstructive disease.  6. Asthma 7. CKD 8. Hyperlipidemia 9. Atrial fibrillation: Paroxysmal.  She has not been on anticoagulation due to history of GI bleed and severe hematoma of right leg requiring surgical evaluation.  10. Type II diabetes 11. Gout  SH: Married, currently in a nursing home, nonsmoker.    FH: No premature CAD  ROS: All systems reviewed and negative except as per HPI.   Current Outpatient Prescriptions  Medication Sig Dispense Refill  . albuterol (PROVENTIL HFA;VENTOLIN HFA) 108 (90 BASE) MCG/ACT inhaler Inhale 2 puffs into the lungs every 6 (six) hours as needed for wheezing or shortness of breath.       [name] albuterol (PROVENTIL) (2.5 MG/3ML) 0.083% nebulizer solution Take 2.5 mg by nebulization every 6 (six) hours as needed for wheezing or shortness  of breath.      . allopurinol (ZYLOPRIM) 100 MG tablet Take 100 mg by mouth daily. Patient takes at 0800am daily      . ALPRAZolam (XANAX) 0.25 MG tablet Take one tablet by mouth every night at bedtime  30 tablet  5  . amiodarone (PACERONE) 200 MG tablet Take 200 mg by mouth daily.       [name] amLODipine (NORVASC) 10 MG tablet Take 10 mg by mouth daily. Patient takes at 0800am daily      . atorvastatin (LIPITOR) 20 MG tablet Take 20 mg by mouth at bedtime. Patient takes at 2100      . hydrALAZINE (APRESOLINE) 25 MG tablet Take 5 tablets (125 mg total) by mouth 3 (three) times daily.  90 tablet  0  . insulin glargine (LANTUS) 100 UNIT/ML injection Inject 15 Units into the skin 2 (two) times daily.      . insulin lispro (HUMALOG) 100 UNIT/ML injection 201-250= 2 units, 251-300= 4 units, 301-350=6 units, 351-400= 8 units, >400= 10 units and call MD      . isosorbide dinitrate (ISORDIL) 30 MG tablet Take 1 tablet (30 mg total) by mouth 2 (two) times daily.      . metoCLOPramide (REGLAN) 5 MG tablet Take 5 mg by mouth 3 (three) times daily.      . metoprolol tartrate (LOPRESSOR) 25 MG tablet Take 12.5 mg by  mouth 2 (two) times daily.      [name] omeprazole (PRILOSEC) 20 MG capsule Take 20 mg by mouth daily.      [name] oxyCODONE-acetaminophen (PERCOCET/ROXICET) 5-325 MG per tablet Take 2 tablets by mouth every 6 (six) hours as needed. Take two tablets by mouth every 6 hours as needed for severe pain      . polyethylene glycol (MIRALAX / GLYCOLAX) packet Take 17 g by mouth 2 (two) times daily.      . traZODone (DESYREL) 50 MG tablet Take 25 mg by mouth at bedtime.      . torsemide (DEMADEX) 20 MG tablet Take 4 tablets (80 mg total) by mouth daily.  180 tablet  3   No current facility-administered medications for this encounter.    BP 122/64  Pulse 48  SpO2 97% General: Obese, looks older than stated age.  Neck: JVP 8-9 cm, no thyromegaly or thyroid nodule.  Lungs: Clear to auscultation bilaterally with normal  respiratory effort. CV: Nondisplaced PMI.  Heart regular S1/S2, no S3/S4, 1/6 early SEM.  1+ ankle edema.  No carotid bruit.  Normal pedal pulses.  Abdomen: Soft, nontender, no hepatosplenomegaly, no distention.  Skin: Intact without lesions or rashes.  Neurologic: Alert and oriented x 3.  Psych: Normal affect. Extremities: No clubbing or cyanosis.  HEENT: Normal.   Assessment/Plan: 1. Chronic diastolic CHF: Patient remains volume overloaded on exam.  Diuresis is limited by renal dysfunction. NYHA class IV symptoms.  - Stop Lasix and start torsemide 80 mg daily.   - Check BMET/BNP  In 1 week.  2. CKD: Last creatinine 2.3.  Will need to watch closely with diuresis.  I think that it would be reasonable for her to see a nephrologist.  I will arrange this.  3. Paroxysmal atrial fibrillation: Patient remains in NSR on amiodarone.  She is not on coumadin with history of GI bleeding as well as a severe right leg hematoma, both while on coumadin.   - Given amiodarone use, will check TSH today.  Recent LFTs were normal.  Should have yearly eye exam.  4. OHS/OSA: Patient is on home oxygen.  I think that it would be reasonable for her establish with a pulmonologist.  I will arrange for this at next appointment.  5. HTN: BP is controlled.   Loralie Champagne 02/05/2014

## 2014-02-07 ENCOUNTER — Encounter (HOSPITAL_COMMUNITY): Payer: Medicare HMO

## 2014-02-11 ENCOUNTER — Non-Acute Institutional Stay (SKILLED_NURSING_FACILITY): Payer: Medicare HMO | Admitting: Internal Medicine

## 2014-02-11 DIAGNOSIS — I509 Heart failure, unspecified: Secondary | ICD-10-CM

## 2014-02-11 DIAGNOSIS — I5041 Acute combined systolic (congestive) and diastolic (congestive) heart failure: Secondary | ICD-10-CM

## 2014-02-11 DIAGNOSIS — R001 Bradycardia, unspecified: Secondary | ICD-10-CM

## 2014-02-11 DIAGNOSIS — I498 Other specified cardiac arrhythmias: Secondary | ICD-10-CM

## 2014-02-14 ENCOUNTER — Encounter (HOSPITAL_COMMUNITY): Payer: Self-pay

## 2014-02-14 ENCOUNTER — Ambulatory Visit (HOSPITAL_COMMUNITY)
Admission: RE | Admit: 2014-02-14 | Discharge: 2014-02-14 | Disposition: A | Discharge status: Home / Self Care | Payer: Medicare HMO | Source: Ambulatory Visit | Attending: Internal Medicine | Admitting: Internal Medicine

## 2014-02-14 VITALS — BP 153/50 | HR 56

## 2014-02-14 DIAGNOSIS — G4733 Obstructive sleep apnea (adult) (pediatric): Secondary | ICD-10-CM

## 2014-02-14 DIAGNOSIS — I480 Paroxysmal atrial fibrillation: Secondary | ICD-10-CM

## 2014-02-14 DIAGNOSIS — I5032 Chronic diastolic (congestive) heart failure: Secondary | ICD-10-CM | POA: Insufficient documentation

## 2014-02-14 DIAGNOSIS — I509 Heart failure, unspecified: Secondary | ICD-10-CM | POA: Insufficient documentation

## 2014-02-14 DIAGNOSIS — I4891 Unspecified atrial fibrillation: Secondary | ICD-10-CM

## 2014-02-14 DIAGNOSIS — N184 Chronic kidney disease, stage 4 (severe): Secondary | ICD-10-CM

## 2014-02-14 DIAGNOSIS — D649 Anemia, unspecified: Secondary | ICD-10-CM

## 2014-02-14 NOTE — Progress Notes (Signed)
Patient ID: TACI STERLING, female   DOB: 11/23/1951, 63 y.o.   MRN: [id number] PCP: Dr. Dellia Nims  63 yo with chronic diastolic CHF, CKD, and OHS/OSA on home oxygen presents for cardiology evaluation.  She has a long history of diastolic CHF and PAF. Not on anticoagulants due to GI bleed and severe hematoma. .  Last echo in 11/14 showed EF 50-55%.  She has had multiple CHF admissions over the last year.  Most recently, in 2/15, she was admitted to [facility] with a diastolic CHF exacerbation.  She was diuresed and discharged 01/28/14 to a nursing home.  Discharge weight 254 pounds.   She returns for follow. Last visit lasix was stopped and she ws started on torsemide however she continued on lasix 80 mg twice a day. Denies SOB/PND. Sleeps with HOB elevated. No weights available. SNFcontatcted but no weights available.  Wears 2 liters oxygen. She continues to work with therapy. She is chair bound. Resides at [facility] SNF>   Labs (2/15): K 3.9, creatinine 2.34, LFTs normal  PMH: 1. Diastolic CHF: Echo (17/51) with EF 50-55%, mild LVH, mild AI.  2. Morbid obesity 3. H/o left TKR 4. OSA/OHS: On oxygen for > 1 year.  5. LHC in 10/05 with nonobstructive disease.  6. Asthma 7. CKD 8. Hyperlipidemia 9. Atrial fibrillation: Paroxysmal.  She has not been on anticoagulation due to history of GI bleed and severe hematoma of right leg requiring surgical evaluation.  10. Type II diabetes 11. Gout  SH: Married, currently in a nursing home, nonsmoker. Currently at [facility].   FH: No premature CAD  ROS: All systems reviewed and negative except as per HPI.   Current Outpatient Prescriptions  Medication Sig Dispense Refill  . albuterol (PROVENTIL HFA;VENTOLIN HFA) 108 (90 BASE) MCG/ACT inhaler Inhale 2 puffs into the lungs every 6 (six) hours as needed for wheezing or shortness of breath.       [name] albuterol (PROVENTIL) (2.5 MG/3ML) 0.083% nebulizer solution Take 2.5 mg by nebulization every 6 (six) hours  as needed for wheezing or shortness of breath.      . allopurinol (ZYLOPRIM) 100 MG tablet Take 100 mg by mouth daily. Patient takes at 0800am daily      . ALPRAZolam (XANAX) 0.25 MG tablet Take one tablet by mouth every night at bedtime  30 tablet  5  . amiodarone (PACERONE) 200 MG tablet Take 200 mg by mouth daily.       [name] amLODipine (NORVASC) 10 MG tablet Take 10 mg by mouth daily. Patient takes at 0800am daily      . atorvastatin (LIPITOR) 20 MG tablet Take 20 mg by mouth at bedtime. Patient takes at 2100      . hydrALAZINE (APRESOLINE) 25 MG tablet Take 5 tablets (125 mg total) by mouth 3 (three) times daily.  90 tablet  0  . insulin glargine (LANTUS) 100 UNIT/ML injection Inject 15 Units into the skin 2 (two) times daily.      . insulin lispro (HUMALOG) 100 UNIT/ML injection 201-250= 2 units, 251-300= 4 units, 301-350=6 units, 351-400= 8 units, >400= 10 units and call MD      . isosorbide dinitrate (ISORDIL) 30 MG tablet Take 1 tablet (30 mg total) by mouth 2 (two) times daily.      . metoCLOPramide (REGLAN) 5 MG tablet Take 5 mg by mouth 3 (three) times daily.      . metoprolol tartrate (LOPRESSOR) 25 MG tablet Take 12.5 mg by mouth  2 (two) times daily.      [name] omeprazole (PRILOSEC) 20 MG capsule Take 20 mg by mouth daily.      [name] oxyCODONE-acetaminophen (PERCOCET/ROXICET) 5-325 MG per tablet Take 2 tablets by mouth every 6 (six) hours as needed. Take two tablets by mouth every 6 hours as needed for severe pain      . polyethylene glycol (MIRALAX / GLYCOLAX) packet Take 17 g by mouth 2 (two) times daily.      [name] torsemide (DEMADEX) 20 MG tablet Take 4 tablets (80 mg total) by mouth daily.  180 tablet  3  . traZODone (DESYREL) 50 MG tablet Take 25 mg by mouth at bedtime.       No current facility-administered medications for this encounter.    BP 153/50  Pulse 56  SpO2 97% General: Obese, looks older than stated age. Sitting in wheelchair.  Neck: JVP 9-10 cm, no thyromegaly or thyroid  nodule.  Lungs: Clear to auscultation bilaterally with normal respiratory effort. CV: Nondisplaced PMI.  Heart regular S1/S2, no S3/S4, 1/6 early SEM.  1+ ankle edema.  No carotid bruit.  Normal pedal pulses.  Abdomen: Soft, nontender, no hepatosplenomegaly, ++ distention.  Skin: Intact without lesions or rashes.  Neurologic: Alert and oriented x 3.  Psych: Normal affect. Extremities: No clubbing or cyanosis. R and LLE 1+ edema.  HEENT: Normal.    Assessment/Plan: 1. Chronic diastolic KPT:WSFK IIIb.  Last visit she was to stop lasix and start torsemide 80 mg daily however that did not occur at SNF. Volume status remains elevated. Stop lasix and start torsemide as recommended at  last OV. Check BMET next week at SNF.  Have sent HF SNF orders for fluid restrictions and low salt diet.      2. CKD: Last creatinine 2.3.  Will need to watch closely with diuresis. BMET next week    3. Paroxysmal atrial fibrillation: Patient remains in NSR on amiodarone.  She is not on coumadin with history of GI bleeding as well as a severe right leg hematoma, both while on coumadin.   - Given amiodarone use, TSH ok and recent LFTs were normal.  Should have yearly eye exam.  4. OHS/OSA: On 2 lters [place] . Refer to pulmonary at next visit.  Follow up in 2 weeks   CLEGG,AMY NP-C  02/14/2014

## 2014-02-15 NOTE — Progress Notes (Addendum)
Patient ID: [name], female   DOB: 1951/05/27, 63 y.o.   MRN: [id number]                  PROGRESS NOTE  DATE:  02/11/2014     FACILITY: [facility]    LEVEL OF CARE:   SNF   Acute Visit   CHIEF COMPLAINT:  Follow up CHF.    HISTORY OF PRESENT ILLNESS:  This is a 63 year-old woman whom I admitted to the building on 02/01/2014 after being in hospital with heart failure.  It turns out she has a long history of diastolic heart failure with the last echocardiogram in November 2014 showing an EF of 50-55% and she has had multiple CHF admissions, most recently in 43/3295 with a diastolic CHF exacerbation.  She was discharged to a nursing home.    During a Cardiology appointment on 02/04/2014, she was felt to be volume-overloaded and she was changed from Lasix 80 b.i.d. to Demadex 80 daily.  She was also felt to be having New York Heart Association Class IV symptoms.     She is also known to have chronic kidney disease.    Finally, she has atrial fibrillation, on amiodarone and metoprolol.  She is not on anticoagulation due to a history of GI bleeding and a right leg hematoma.    Her TSH was recently checked.    CURRENT MEDICATIONS:  Medication list is reviewed.   She is on:     Zyloprim 100 q.d.    Amiodarone 200 daily.    Omeprazole 20 q.d.    Isordil 30 twice a day.    Demadex 80 q.d.    Lopressor 25, 1/2 tablet (12.5) b.i.d.    Apresoline 50 mg, 2-1/2 tablets three times a day.    Reglan 5 mg three times a day.    Xanax 0.25 q.h.s.    Norvasc 10 q.d.    Lipitor 20 q.d.    Desyrel 25 q.h.s.    REVIEW OF SYSTEMS:   CHEST/RESPIRATORY:  The patient does not complain of shortness of breath.   CARDIAC:   No clear chest pain, and I am not really sure what she thinks of her edema.   GI:  No nausea or vomiting.    PHYSICAL EXAMINATION:   VITAL SIGNS:   PULSE:  44 and regular.   BLOOD PRESSURE:  Stable.   RESPIRATIONS:  18.   O2 SATURATIONS:  95% on 2  L.   GENERAL APPEARANCE:  The patient is awake, can talk, but looks somewhat listless.   CHEST/RESPIRATORY:  Very shallow air entry bilaterally.   CARDIOVASCULAR:  CARDIAC:   Heart sounds are distant.  There are no gallops.  JVP is not elevated.   EDEMA/VARICOSITIES:  Extremities:  Venous stasis, minimal edema.    LABORATORY DATA:  Last lab work from 01/31/2014 showed a white count of 5.9, hemoglobin of 9.4.    Her BUN was 35, creatinine 1.85.    The rest of her CBC and metabolic panel were normal.    ASSESSMENT/PLAN:  Chronic diastolic heart failure.  According to the weights in the facility, she has actually gone from 259 on admission to 240 today which is a weight loss of 19 pounds.  Although she appears to have been stable over the last several days, she will definitely need a metabolic panel checked on Monday.    Chronic renal insufficiency.  This seems stable.    Bradycardia.  She has paroxysmal  atrial fibrillation and she had a TSH checked during her last Cardiology visit, although I do not see these results.  The last TSH I see is 7.323 on 11/17/2013.  In any case, her heart rate of 44 is probably excessive.  I am going to reduce her beta blocker further.  I would wonder whether her amiodarone needs to be decreased.

## 2014-02-18 ENCOUNTER — Non-Acute Institutional Stay (SKILLED_NURSING_FACILITY): Payer: Medicare HMO | Admitting: Internal Medicine

## 2014-02-18 DIAGNOSIS — I509 Heart failure, unspecified: Secondary | ICD-10-CM

## 2014-02-18 DIAGNOSIS — N184 Chronic kidney disease, stage 4 (severe): Secondary | ICD-10-CM

## 2014-02-18 DIAGNOSIS — M109 Gout, unspecified: Secondary | ICD-10-CM

## 2014-02-18 DIAGNOSIS — I5041 Acute combined systolic (congestive) and diastolic (congestive) heart failure: Secondary | ICD-10-CM

## 2014-02-18 NOTE — Progress Notes (Signed)
Patient ID: [name], female   DOB: 04-13-1951, 63 y.o.   MRN: [id number] Patient ID: [name], female   DOB: Jul 04, 1951, 63 y.o.   MRN: [id number]                  PROGRESS NOTE  DATE:  02/18/2014     FACILITY: [facility]    LEVEL OF CARE:   SNF   Acute Visit   CHIEF COMPLAINT:  Follow up CHF, worsening renal function  HISTORY OF PRESENT ILLNESS:  This is a 63 year-old woman whom I admitted to the building on 02/01/2014 after being in hospital with heart failure.  It turns out she has a long history of diastolic heart failure with the last echocardiogram in November 2014 showing an EF of 50-55% and she has had multiple CHF admissions, most recently in 04/3975 with a diastolic CHF exacerbation.  She was discharged to a nursing home.    During a Cardiology appointment in late Feb his lasix was discontinued and he was started on demadex 80 mg a day. Her weight went from 259 on 2/25, 242 on 3/5,240 on 3/6,241 on 3/7 up to 245 on 3/12 . She had a followup cardiologist appointment this week and they expressed dismay that the Demadex has not been started however it has. When I last saw this patient a week ago her pulse rate was in the 40s I have reviewed her to reduced the metoprolol.  She is also known to have chronic kidney disease on 2/23 her BUN was 35 and creatinine 1.85. On 3/10 BUN 64 creatinine 2.46 TSH 3.2 potassium 4.3. Sodium was 143  Finally, she has atrial fibrillation, on amiodarone and metoprolol.  She is not on anticoagulation due to a history of GI bleeding and a right leg hematoma.      CURRENT MEDICATIONS:  Medication list is reviewed.   She is on:     Zyloprim 100 q.d.    Amiodarone 200 daily.    Omeprazole 20 q.d.    Isordil 30 twice a day.    Demadex 80 q.d.    Lopressor 25, 1/4 tablet (6.25) b.i.d.    Apresoline 50 mg, 2-1/2 tablets three times a day.    Reglan 5 mg three times a day.    Xanax 0.25 q.h.s.    Norvasc 10 q.d.     Lipitor 20 q.d.    Desyrel 25 q.h.s.    REVIEW OF SYSTEMS:   CHEST/RESPIRATORY:  The patient does not complain of shortness of breath.   CARDIAC:   No clear chest pain, and I am not really sure what she thinks of her edema.   GI:  No nausea or vomiting.   PATIENT STATES THAT SHE DOES NOT HAVE ANY SWALLOWING PROBLEMS HOWEVER SHE SIMPLY DOES NOT WANT FOOD.  PHYSICAL EXAMINATION:   VITAL SIGNS:   PULSE:  68 and regular.   BLOOD PRESSURE:  150/64 RESPIRATIONS:  18.   O2 SATURATIONS:  95% on 2 L.   GENERAL APPEARANCE:  The patient is awake, can talk, but looks somewhat listless.   CHEST/RESPIRATORY:  Very shallow air entry bilaterally.   CARDIOVASCULAR:  CARDIAC:   Heart sounds are distant.  There are no gallops.  JVP is not elevated.  She has virtually no edema. 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 She tells me she has a  history of gout and indeed she is on allopurinol. The tenderness is quite exquisite  LABORATORY DATA:   please see BMP data listed above   ASSESSMENT/PLAN:  Chronic diastolic heart failure.  According to the weights in the facility she has lost a considerable amount of weight..  Although she appears to have been stable over the last several days.  Chronic renal insufficiency.  although she does not appear dehydrated at the bedside I have no doubt that this is due to significant prerenal factors. I am going to put the Demadex on hold for perhaps 48 hours and then reduce the dose to 40 mg. Followup on her BMP early next week    Bradycardia.  She has paroxysmal atrial fibrillation but recently has been in sinus rhythm. Her pulse rate is up to 68 today on the lower dose of Lopressor  Anorexia; according to staff sheets 50-75% the patient herself states she does not feel like eating. Although there are a myriad of  possible etiologies here one would simply wonder about the amiodarone which in some patients is a very poorly tolerated drug. 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 She is certainly not a candidate for NSAIDs or even colchicine would be a worry.   No IV fluid as required. Demadex placed on hold for 48 hours we'll restart 40 mg on Sunday following her weights and BMP

## 2014-02-25 ENCOUNTER — Other Ambulatory Visit (HOSPITAL_BASED_OUTPATIENT_CLINIC_OR_DEPARTMENT_OTHER): Payer: Self-pay | Admitting: Internal Medicine

## 2014-03-02 ENCOUNTER — Ambulatory Visit (HOSPITAL_COMMUNITY)
Admission: RE | Admit: 2014-03-02 | Discharge: 2014-03-02 | Disposition: A | Discharge status: Home / Self Care | Payer: Medicare HMO | Source: Ambulatory Visit | Attending: Internal Medicine | Admitting: Internal Medicine

## 2014-03-02 VITALS — BP 138/42 | HR 54 | Resp 20

## 2014-03-02 DIAGNOSIS — N184 Chronic kidney disease, stage 4 (severe): Secondary | ICD-10-CM

## 2014-03-02 DIAGNOSIS — G4733 Obstructive sleep apnea (adult) (pediatric): Secondary | ICD-10-CM

## 2014-03-02 DIAGNOSIS — L409 Psoriasis, unspecified: Secondary | ICD-10-CM

## 2014-03-02 DIAGNOSIS — I509 Heart failure, unspecified: Secondary | ICD-10-CM

## 2014-03-02 DIAGNOSIS — E662 Morbid (severe) obesity with alveolar hypoventilation: Secondary | ICD-10-CM

## 2014-03-02 DIAGNOSIS — I5032 Chronic diastolic (congestive) heart failure: Secondary | ICD-10-CM

## 2014-03-02 MED ORDER — TORSEMIDE 20 MG PO TABS: 80.0000 mg | ORAL_TABLET | Freq: Every day | ORAL | Status: DC

## 2014-03-02 NOTE — Progress Notes (Signed)
Patient ID: [name], female   DOB: Feb 20, 1951, 63 y.o.   MRN: [id number]  PCP: Dr. Dellia Nims Pulmonologist: Dr Annamaria Boots  Resides at [facility]  [phone] with chronic diastolic CHF, CKD, and OHS/OSA on home oxygen.  She has a long history of diastolic CHF and PAF. Not on anticoagulants due to GI bleed and severe hematoma. Last echo in 11/14 showed EF 50-55%.  She has had multiple CHF admissions over the last year.  Most recently, in 2/15, she was admitted to [facility] with a diastolic CHF exacerbation.  She was diuresed and discharged 01/28/14 to a nursing home.  Discharge weight 254 pounds.   Follow up:  For the past 2 visits have asked the facility to change from lasix to torsemide 80 mg daily, however still remains on lasix. Can't weigh standing up and weighs in wheelchair, weight has been 239-241 lbs. Denies SOB, orthopnea, PND, or edema. Wears 2 liters oxygen. She continues to work with therapy, and reports it is "so so." She is chair bound. Resides at [facility].  Labs (2/15): K 3.9, creatinine 2.34, LFTs normal          (3/15): K 4.6, Cr 3.76  PMH: 1. Diastolic CHF: Echo (28/31) with EF 50-55%, mild LVH, mild AI.  2. Morbid obesity 3. H/o left TKR 4. OSA/OHS: On oxygen for > 1 year.  5. LHC in 10/05 with nonobstructive disease.  6. Asthma 7. CKD 8. Hyperlipidemia 9. Atrial fibrillation: Paroxysmal.  She has not been on anticoagulation due to history of GI bleed and severe hematoma of right leg requiring surgical evaluation.  10. Type II diabetes 11. Gout  SH: Married, currently in a nursing home, nonsmoker. Currently at [facility].   FH: No premature CAD  ROS: All systems reviewed and negative except as per HPI.   Current Outpatient Prescriptions  Medication Sig Dispense Refill  . allopurinol (ZYLOPRIM) 100 MG tablet Take 100 mg by mouth daily. Patient takes at 0800am daily      . amiodarone (PACERONE) 200 MG tablet Take 200 mg by mouth daily.       . isosorbide  dinitrate (ISORDIL) 30 MG tablet Take 1 tablet (30 mg total) by mouth 2 (two) times daily.      [name] omeprazole (PRILOSEC) 20 MG capsule Take 20 mg by mouth daily.      [name] albuterol (PROVENTIL HFA;VENTOLIN HFA) 108 (90 BASE) MCG/ACT inhaler Inhale 2 puffs into the lungs every 6 (six) hours as needed for wheezing or shortness of breath.       [name] albuterol (PROVENTIL) (2.5 MG/3ML) 0.083% nebulizer solution Take 2.5 mg by nebulization every 6 (six) hours as needed for wheezing or shortness of breath.      . ALPRAZolam (XANAX) 0.25 MG tablet Take one tablet by mouth every night at bedtime  30 tablet  5  . amLODipine (NORVASC) 10 MG tablet Take 10 mg by mouth daily. Patient takes at 0800am daily      . atorvastatin (LIPITOR) 20 MG tablet Take 20 mg by mouth at bedtime. Patient takes at 2100      . hydrALAZINE (APRESOLINE) 25 MG tablet Take 5 tablets (125 mg total) by mouth 3 (three) times daily.  90 tablet  0  . insulin glargine (LANTUS) 100 UNIT/ML injection Inject 15 Units into the skin 2 (two) times daily.      . insulin lispro (HUMALOG) 100 UNIT/ML injection 201-250= 2 units, 251-300= 4 units, 301-350=6 units, 351-400= 8 units, >  400= 10 units and call MD      . metoCLOPramide (REGLAN) 5 MG tablet Take 5 mg by mouth 3 (three) times daily.      . metoprolol tartrate (LOPRESSOR) 25 MG tablet Take 6.25 mg by mouth 2 (two) times daily.       [name] oxyCODONE-acetaminophen (PERCOCET/ROXICET) 5-325 MG per tablet Take 2 tablets by mouth every 6 (six) hours as needed. Take two tablets by mouth every 6 hours as needed for severe pain      . polyethylene glycol (MIRALAX / GLYCOLAX) packet Take 17 g by mouth 2 (two) times daily.      [name] torsemide (DEMADEX) 20 MG tablet Take 4 tablets (80 mg total) by mouth daily.  180 tablet  3  . traZODone (DESYREL) 50 MG tablet Take 25 mg by mouth at bedtime.       No current facility-administered medications for this encounter.    BP 138/42  Pulse 54  Resp 20  SpO2 97%  General:  Obese, looks older than stated age. Sitting in wheelchair.  Neck: JVP 8  cm, no thyromegaly or thyroid nodule.  Lungs: Clear to auscultation bilaterally with normal respiratory effort. CV: Nondisplaced PMI.  Heart regular S1/S2, no S3/S4, 1/6 early SEM.  1+ ankle edema.  No carotid bruit.  Normal pedal pulses.  Abdomen: Soft, nontender, no hepatosplenomegaly, mildly distented.  Skin: Intact without lesions or rashes.  Neurologic: Alert and oriented x 3.  Psych: Normal affect. Extremities: No clubbing or cyanosis. R and LLE 1+ edema. L knee tender to touch and warm HEENT: Normal.    Assessment/Plan:  1. Chronic diastolic CHF: - NYHA III symptoms and volume status remains slightly elevated. The last 2 visits have asked [facility] to switch lasix 80 mg BID to torsemide 80 mg daily, however they still have not. Waiting call back from RN and will ask again to switch to torsemide 80 mg dialy with BMET in 2 weeks. - Continue to weigh daily and fax weights weekly to HF clinic - Reinforced the need and importance of daily weights, a low sodium diet, and fluid restriction (less than 2 L a day). Instructed to call the HF clinic if weight increases more than 3 lbs overnight or 5 lbs in a week.   2. CKD: Last creatinine 2.4, stable. Will recheck 2 weeks with hopeful change to torsemide.   3. Paroxysmal atrial fibrillation: Patient appears to be in NSR on amiodarone.  She is not on coumadin with history of GI bleeding as well as a severe right leg hematoma, both while on coumadin.   - Given amiodarone use, TSH ok and recent LFTs were normal.  Should have yearly eye exam and recommended to pt. 4. OHS/OSA: On 2 lters [place] She reports seeing Dr. Annamaria Boots before will refer back. 5. Gout - L knee tender to touch and warm. Will give prednisone 40 mg for 3 days.   Follow up 4-6 weeks  Rande Brunt NP-C  03/02/2014

## 2014-03-04 ENCOUNTER — Encounter: Payer: Self-pay | Admitting: Internal Medicine

## 2014-03-04 ENCOUNTER — Ambulatory Visit (INDEPENDENT_AMBULATORY_CARE_PROVIDER_SITE_OTHER): Payer: Medicare HMO | Admitting: Internal Medicine

## 2014-03-04 VITALS — BP 144/68 | HR 57 | Ht 63.0 in | Wt 239.0 lb

## 2014-03-04 DIAGNOSIS — E662 Morbid (severe) obesity with alveolar hypoventilation: Secondary | ICD-10-CM

## 2014-03-04 DIAGNOSIS — G4733 Obstructive sleep apnea (adult) (pediatric): Secondary | ICD-10-CM

## 2014-03-04 NOTE — Progress Notes (Signed)
Patient ID: [name], female    DOB: 1951-05-11, 63 y.o.   MRN: [id number]  HPI 05/02/11- 63 yoF former smoker, followed for sleep apnea, complicated by obesity/ hypoventilation syndrome, AFib, HBP, hx CHF, hx CVA. Last here Decmber 15, 2011. Note reviewed. Since last here Dr Marlou Sa ordered oxygen through [facility] and she is using that instead of CPAP which is from [place]. She wasn't shown how to join O2 to CPAP. Was using CPAP every night before that. Now having to sleep up in chair, wakes gasping.  Asthma control worse in last 2 days- felt weather change - using inhalers more. Notes some pain right shoulder area with deep breath. Saw dermatologist for stasis changes in legs.  11/06/11- 63 yoF former smoker, followed for sleep apnea, complicated by obesity/ hypoventilation syndrome, AFib, HBP, hx CHF, hx CVA. She had a long hospitalization during the summer. Continues home oxygen at 2 L, especially for sleep. While she was in the hospital, [employer] stopped her CPAP and reported her to the credit bureau for nonpayment, so she no longer has CPAP. She continues to sleep in a recliner chair. Her legs are so heavy that when she lies in bed, they ache but she is unable to lift them to turn over.  03/04/14-  63 yoF former smoker, followed for sleep apnea, complicated by obesity/ hypoventilation syndrome, AFib, HBP, hx CHF, hx CVA. FOLLOWS FOR: Pt c/o congestion, decreased SOB but still present with exertion.  Pt has not worn cpap/ [employer] for the past few months - finance probs. O2 2L/ [employer].   "I'm not as short f breath as they say I am."  CXR 01/25/14 IMPRESSION:  Pulmonary edema increased since previous exam.  Electronically Signed  By: Lavonia Dana M.D.  On: 01/25/2014 16:19  Review of Systems Constitutional:   No-   weight loss, night sweats, fevers, chills, fatigue,+ lassitude. HEENT:   No-  headaches, difficulty swallowing, tooth/dental problems, sore throat,       No-  sneezing, itching, ear  ache, nasal congestion, post nasal drip,  CV:  No-  chest pain, orthopnea, PND, +swelling in lower extremities, No- anasarca,                                                            dizziness, palpitations Resp: + shortness of breath with exertion or at rest.              No-   productive cough,  No non-productive cough,  No- coughing up of blood.              No-   change in color of mucus.  No- wheezing.   Skin: No-   rash or lesions. GI:  No-   heartburn, indigestion, abdominal pain, nausea, vomiting,  GU: . MS:  No-   joint pain or swelling.  . Neuro-     nothing unusual Psych:  No- change in mood or affect. + depression or anxiety.  No memory loss.  Objective:   Physical Exam General- Alert, Oriented, Affect-appropriate, Distress- none acute, obese Skin- rash-none, lesions- none, excoriation- none Lymphadenopathy- none Head- atraumatic            Eyes- Gross vision intact, PERRLA, conjunctivae clear secretions  Ears- Hearing, canals-normal            Nose- Clear, no-Septal dev, mucus, polyps, erosion, perforation             Throat- Mallampati II , mucosa clear , drainage- none, tonsils- atrophic Neck- flexible , trachea midline, no stridor , thyroid nl, carotid no bruit Chest - symmetrical excursion , unlabored           Heart/CV- RRR-pulse seems regular today , no murmur , no gallop  , no rub, nl s1 s2                           - JVD- none , edema- none, stasis changes- none, varices- none           Lung- clear to P&A-shallow consistent with body habitus, wheeze- none, cough- none , dullness-none, rub- none           Chest wall-  Abd- t Br/ Gen/ Rectal- Not done, not indicated Extrem-  severe dependent peripheral edema and chronic stasis dermatitis Neuro- grossly intact to observation

## 2014-03-04 NOTE — Patient Instructions (Signed)
Ok to continue O2 2L for sleep apnea and obesity-hypoventilation syndrome with hypoxia.  Please call as needed

## 2014-03-10 ENCOUNTER — Other Ambulatory Visit (HOSPITAL_BASED_OUTPATIENT_CLINIC_OR_DEPARTMENT_OTHER): Payer: Self-pay | Admitting: Internal Medicine

## 2014-03-10 DIAGNOSIS — N823 Fistula of vagina to large intestine: Secondary | ICD-10-CM

## 2014-03-14 ENCOUNTER — Encounter: Payer: Self-pay | Admitting: Internal Medicine

## 2014-03-15 ENCOUNTER — Non-Acute Institutional Stay: Payer: Medicare HMO | Admitting: Internal Medicine

## 2014-03-15 DIAGNOSIS — E1129 Type 2 diabetes mellitus with other diabetic kidney complication: Secondary | ICD-10-CM | POA: Diagnosis not present

## 2014-03-15 DIAGNOSIS — E1165 Type 2 diabetes mellitus with hyperglycemia: Secondary | ICD-10-CM | POA: Diagnosis not present

## 2014-03-15 DIAGNOSIS — I509 Heart failure, unspecified: Secondary | ICD-10-CM

## 2014-03-15 DIAGNOSIS — N184 Chronic kidney disease, stage 4 (severe): Secondary | ICD-10-CM | POA: Diagnosis not present

## 2014-03-15 DIAGNOSIS — I5041 Acute combined systolic (congestive) and diastolic (congestive) heart failure: Secondary | ICD-10-CM

## 2014-03-15 NOTE — Progress Notes (Signed)
Patient ID: [name], female   DOB: 07-04-1951, 63 y.o.   MRN: [id number]                   PROGRESS NOTE  DATE:  03/15/2014  FACILITY: [facility]    LEVEL OF CARE:   SNF   Acute Visit   CHIEF COMPLAINT:  Follow up CHF, ?vaginal discharge.   HISTORY OF PRESENT ILLNESS:  This is a 63 year-old woman whom I admitted to the building on 02/01/2014 after being in hospital with heart failure.  It turns out she has a long history of diastolic heart failure with the last echocardiogram in November 2014 showing an EF of 50-55% and she has had multiple CHF admissions, most recently in 07/1447 with a diastolic CHF exacerbation.  She was discharged to a nursing home.    During a Cardiology appointment in late Feb his lasix was discontinued and he was started on demadex 80 mg a day. Her weight went from 259 on 2/25, 242 on 3/5,240 on 3/6,241 on 3/7 up to 245 on 3/12 . She had a followup cardiologist appointment this week and they expressed dismay that the Demadex has not been started however it has. On March 13 I saw the patient for this problem. I felt she was somewhat overdiuresis and I put the Demadex on hold for 2 days and then restarted her on 40 mg a day of Demadex. It would appear that her weights gradually increased from that point on for roughly 243.5 pounds up to 253 pounds on April 2. At that point she was started on Demadex 40 twice a day weights from yesterday and today were roughly at 237 pounds which would reflect a weight loss of 16 pounds in 4-5 days  Lab work from 3/16 showed BUN of 70 and a creatinine of 2.4. Lab work from 4/2 showed a BUN of 78 and a creatinine of 2.55 total CO2 at 30. Lab work from this morning is still pending  Finally another issue is that the question of feculent discharge from her vagina. This apparently came up last month on one occasion. The patient thinks that this was due to fecal incontinence that contaminated her external vaginal OS I discussed  this with the nursing staff who are present and they feel that it probably was fecal contamination rather than true fecal discharge. As far as anybody is concerned this has not happened again. It is worth noting that the patient is incontinent of stool.    CURRENT MEDICATIONS:  Medication list is reviewed.   She is on:     Zyloprim 100 q.d.    Amiodarone 200 daily.    Omeprazole 20 q.d.    Isordil 30 twice a day.    Demadex 40 mg twice a day started on 4/2  Lopressor 25, 1/4 tablet (6.25) b.i.d.    Apresoline 50 mg, 2-1/2 tablets three times a day.    Reglan 5 mg three times a day.    Xanax 0.25 q.h.s.    Norvasc 10 q.d.    Lipitor 20 q.d.    Desyrel 25 q.h.s.    REVIEW OF SYSTEMS:   CHEST/RESPIRATORY:  The patient does not complain of shortness of breath.   CARDIAC:   No clear chest pain, and I am not really sure what she thinks of her edema.   GI:  No nausea or vomiting.   Of note she has fecal as well as urinary incontinence  PHYSICAL EXAMINATION:  VITAL SIGNS:   PULSE:  70 and regular.   BLOOD PRESSURE:  150/64 RESPIRATIONS:  18.   O2 SATURATIONS:  95% on 2 L.   GENERAL APPEARANCE:  The patient is awake and responds CHEST/RESPIRATORY:  Clear entry bilaterally   CARDIOVASCULAR:  CARDIAC:   Heart sounds are distant.  There are no gallops.  JVP is not elevated.  Mucous membranes are moist. She has significant venous stasis EDEMA/VARICOSITIES:  Minimal Musculoskeletal; swelling in her hands but no evidence of active arthritis  LABORATORY DATA:   please see BMP data listed above   ASSESSMENT/PLAN:  Chronic diastolic heart failure.  According to the weights in the facility she is diuresis rapidly on Demadex 40 twice a day  Chronic renal insufficiency. This appears to have worsened somewhat since her admission to the facility although currently she appears to be stable. Her renal failure is clearly stage IV  Bradycardia.  She has paroxysmal atrial fibrillation;  currently stable on Lopressor and amiodarone  ? Feculent vaginal discharge which raised the question of a rectovaginal fistula. There was an order for a CT scan of the pelvis with rectal contrast for one reason or another this was never carried out. I am not quite sure that this didn't represent simply external fecal contamination and this does not seem to have recurred.. I'll have the staff be vigilant about this. It is probably not unreasonable although a logistic challenge to get a vaginal/pelvic examination. We'll have to see if we can arrange this through [facility]  Fecal incontinence as well as urinary incontinence. I don't have an obvious explanation for this at the moment her only to looked through the records and see if I can come up with a obvious cause of this.  Atrial fibrillation which is paroxysmal. This is rate controlled not a candidate for anticoagulation secondary to a history of a spontaneous hematoma requiring surgery  Type 2 diabetes on insulin this appears to be under very good control although I don't see a reasonable recent hemoglobin A1c

## 2014-03-17 ENCOUNTER — Ambulatory Visit (HOSPITAL_COMMUNITY): Payer: Medicare HMO

## 2014-03-30 ENCOUNTER — Encounter: Payer: Self-pay | Admitting: *Deleted

## 2014-03-30 NOTE — Assessment & Plan Note (Signed)
No success att weight loss. Counseled.

## 2014-03-30 NOTE — Assessment & Plan Note (Signed)
Non-compliant then couldn't afford to keep CPAP

## 2014-04-13 ENCOUNTER — Inpatient Hospital Stay (HOSPITAL_COMMUNITY): Admission: RE | Admit: 2014-04-13 | Payer: Medicare HMO | Source: Ambulatory Visit

## 2014-04-13 ENCOUNTER — Encounter (HOSPITAL_COMMUNITY): Payer: Self-pay

## 2014-04-13 ENCOUNTER — Non-Acute Institutional Stay (SKILLED_NURSING_FACILITY): Payer: Medicare HMO | Admitting: Internal Medicine

## 2014-04-13 DIAGNOSIS — I509 Heart failure, unspecified: Secondary | ICD-10-CM

## 2014-04-13 DIAGNOSIS — E1129 Type 2 diabetes mellitus with other diabetic kidney complication: Secondary | ICD-10-CM

## 2014-04-13 DIAGNOSIS — I4891 Unspecified atrial fibrillation: Secondary | ICD-10-CM

## 2014-04-13 DIAGNOSIS — I480 Paroxysmal atrial fibrillation: Secondary | ICD-10-CM

## 2014-04-13 DIAGNOSIS — N184 Chronic kidney disease, stage 4 (severe): Secondary | ICD-10-CM

## 2014-04-15 NOTE — Progress Notes (Signed)
         PROGRESS NOTE  DATE: 04/13/2014  FACILITY: Nursing Home Location: [facility]  LEVEL OF CARE: SNF (31)  Routine Visit  CHIEF COMPLAINT:  Manage atrial fibrillation, CHF and diabetes mellitus  HISTORY OF PRESENT ILLNESS:  REASSESSMENT OF ONGOING PROBLEM(S):  ATRIAL FIBRILLATION: the patients atrial fibrillation remains stable.  The patient denies DOE, tachycardia, orthopnea, transient neurological sx, palpitations, & PNDs.  No complications noted from the medications currently being used.  CHF:The patient does not relate significant weight changes, denies sob, DOE, orthopnea, PNDs, palpitations or chest pain.  CHF remains stable.  No complications form the medications being used. Complains of chronic lower extremity swelling.  DM:pt's DM remains stable.  Pt denies polyuria, polydipsia, polyphagia, changes in vision or hypoglycemic episodes.  No complications noted from the medication presently being used.  Last hemoglobin A1c is: 5.1 in 4-15.  PAST MEDICAL HISTORY : Reviewed.  No changes/see problem list  CURRENT MEDICATIONS: Reviewed per MAR/see medication list  REVIEW OF SYSTEMS:  GENERAL: no change in appetite, no fatigue, no weight changes, no fever, chills or weakness RESPIRATORY: no cough, SOB, DOE, wheezing, hemoptysis CARDIAC: no chest pain, or palpitations, complains of chronic lower extremity swelling GI: no abdominal pain, diarrhea, constipation, heart burn, nausea or vomiting  PHYSICAL EXAMINATION  VS:  See VS section  GENERAL: no acute distress, morbidly obese body habitus EYES: conjunctivae normal, sclerae normal, normal eye lids NECK: supple, trachea midline, no neck masses, no thyroid tenderness, no thyromegaly LYMPHATICS: no LAN in the neck, no supraclavicular LAN RESPIRATORY: breathing is even & unlabored, BS CTAB CARDIAC: Heart rate is irregularly irregular, no murmur,no extra heart sounds, +2 bilateral lower extremity  edema GI: abdomen soft, normal BS, no masses, no tenderness, no hepatomegaly, no splenomegaly PSYCHIATRIC: the patient is alert & oriented to person, affect & behavior appropriate  LABS/RADIOLOGY:  4-15 hemoglobin 8.6, MCV 4 otherwise CBC normal, BUN 79, creatinine 2.86 otherwise BMP normal 3-15 TSH 3.21 2-15 albumin 3.5 otherwise liver profile normal  ASSESSMENT/PLAN:  Atrial fibrillation-rate controlled Diabetes mellitus with renal complications-well controlled CHF-well compensated Chronic kidney disease stage IV-stable Anemia of chronic kidney disease-stable GERD-continue PPI Hyperlipidemia-check fasting lipid panel Gout-no acute episodes Constipation-well controlled Insomnia-continue trazodone  CPT CODE: 24401  Elizebath Wever Y Terran Klinke, MD [place] [phone]

## 2014-04-20 ENCOUNTER — Non-Acute Institutional Stay (SKILLED_NURSING_FACILITY): Payer: Medicare HMO | Admitting: Internal Medicine

## 2014-04-20 DIAGNOSIS — F411 Generalized anxiety disorder: Secondary | ICD-10-CM

## 2014-04-20 DIAGNOSIS — K3184 Gastroparesis: Secondary | ICD-10-CM

## 2014-04-22 DIAGNOSIS — K3184 Gastroparesis: Secondary | ICD-10-CM | POA: Insufficient documentation

## 2014-04-22 DIAGNOSIS — F411 Generalized anxiety disorder: Secondary | ICD-10-CM | POA: Insufficient documentation

## 2014-04-22 NOTE — Progress Notes (Signed)
Patient ID: [name], female   DOB: 27-Dec-1950, 63 y.o.   MRN: [id number]            PROGRESS NOTE  DATE: 04/20/2014       FACILITY:  [facility]  LEVEL OF CARE: SNF (31)  Acute Visit  CHIEF COMPLAINT:  Manage gastroparesis and anxiety.    HISTORY OF PRESENT ILLNESS: I was requested by the staff to assess the patient regarding above problem(s):  GASTROPARESIS:  I was requested by the pharmacy consultant to assess the patient for continued need for Reglan.  The patient has a history of diabetes mellitus and admits to intermittent nausea and vomiting.    ANXIETY:  I was requested by the pharmacy consultant to assess the patient for dose reduction of her Xanax.  The anxiety remains stable. Patient denies ongoing anxiety or irritability. No complications reported from the medications currently being used.    PAST MEDICAL HISTORY : Reviewed.  No changes/see problem list  CURRENT MEDICATIONS: Reviewed per MAR/see medication list  REVIEW OF SYSTEMS:  GENERAL: no change in appetite, no fatigue, no weight changes, no fever, chills or weakness RESPIRATORY: no cough, SOB, DOE,, wheezing, hemoptysis CARDIAC: no chest pain or palpitations;  chronic lower extremity swelling    GI: no abdominal pain, diarrhea, constipation, heart burn, nausea or vomiting  PHYSICAL EXAMINATION  VS:  T 97.1       P 58      RR 18      BP 124/56      WT (Lb) 235.5       GENERAL: no acute distress, morbidly obese body habitus NECK: supple, trachea midline, no neck masses, no thyroid tenderness, no thyromegaly RESPIRATORY: breathing is even & unlabored, BS CTAB CARDIAC: RRR, no murmur,no extra heart sounds, +2 bilateral lower extremity edema    GI: abdomen soft, normal BS, no masses, no tenderness, no hepatomegaly, no splenomegaly PSYCHIATRIC: the patient is alert & oriented to person, affect & behavior appropriate  ASSESSMENT/PLAN:  Gastroparesis.  Continue Reglan.    Anxiety.   Stable.  Decrease Xanax to 0.25 mg q.h.s. p.r.n.     CPT CODE: 41740       Merlie Noga Y Lucina Betty, [place] [phone]

## 2014-05-10 NOTE — Progress Notes (Addendum)
Patient ID: [name], female   DOB: 10/13/1951, 63 y.o.   MRN: [id number]  PCP: Dr. Dellia Nims Pulmonologist: Dr Annamaria Boots  Resides at [facility]  [phone] with chronic diastolic HF, CKD, CVA, PAF and OHS/OSA on home oxygen. Not on anticoagulants due to GI bleed and severe hematoma. Last echo in 11/14 showed EF 50-55%.  She has had multiple CHF admissions in the past for diastolic CHF exacerbation. Most recently, in 2/15, she was admitted to [facility] with a diastolic CHF exacerbation.  She was diuresed and discharged 01/28/14 to a nursing home.  Discharge weight 254 pounds.   Follow up for Heart Failure:  Last visit asked the SNF again to please change torsemide to 80 mg daily and it appears that she is taking 40 mg BID. "Feels ok." Has cough with clear mucous. Still not able to stand up for weights, however reports she can be weighed in sling but it is not being done. Denies SOB, PND, orthopnea or CP. Wears chronic 2L O2. Not participating in therapy now. Chair bound. Following low salt diet and drinking less than 2L a day. Not wearing CPAP at night.   Labs (2/15): K 3.9, creatinine 2.34, LFTs normal          (3/15): K 4.6, Cr 2.44          (4/15): BUN 79, creatinine 2.86 (from [facility])  PMH: 1. Diastolic CHF: Echo (62/70) with EF 50-55%, mild LVH, mild AI.  2. Morbid obesity 3. H/o left TKR 4. OSA/OHS: On oxygen for > 1 year.  5. LHC in 10/05 with nonobstructive disease.  6. Asthma 7. CKD 8. Hyperlipidemia 9. Atrial fibrillation: Paroxysmal.  She has not been on anticoagulation due to history of GI bleed and severe hematoma of right leg requiring surgical evaluation.  10. Type II diabetes 11. Gout  SH: Married, currently in a nursing home, nonsmoker. Currently at [facility].   FH: No premature CAD  ROS: All systems reviewed and negative except as per HPI.   Current Outpatient Prescriptions  Medication Sig Dispense Refill  . albuterol (PROVENTIL HFA;VENTOLIN HFA) 108 (90 BASE)  MCG/ACT inhaler Inhale 2 puffs into the lungs every 6 (six) hours as needed for wheezing or shortness of breath.       [name] albuterol (PROVENTIL) (2.5 MG/3ML) 0.083% nebulizer solution Take 2.5 mg by nebulization every 6 (six) hours as needed for wheezing or shortness of breath.      . allopurinol (ZYLOPRIM) 100 MG tablet Take 100 mg by mouth daily. Patient takes at 0800am daily      . ALPRAZolam (XANAX) 0.25 MG tablet Take one tablet by mouth every night at bedtime  30 tablet  5  . amiodarone (PACERONE) 200 MG tablet Take 200 mg by mouth daily.       [name] amLODipine (NORVASC) 10 MG tablet Take 10 mg by mouth daily. Patient takes at 0800am daily      . atorvastatin (LIPITOR) 20 MG tablet Take 20 mg by mouth at bedtime. Patient takes at 2100      . hydrALAZINE (APRESOLINE) 25 MG tablet Take 5 tablets (125 mg total) by mouth 3 (three) times daily.  90 tablet  0  . insulin glargine (LANTUS) 100 UNIT/ML injection Inject 15 Units into the skin 2 (two) times daily.      . insulin lispro (HUMALOG) 100 UNIT/ML injection 201-250= 2 units, 251-300= 4 units, 301-350=6 units, 351-400= 8 units, >400= 10 units and call MD      .  isosorbide dinitrate (ISORDIL) 30 MG tablet Take 1 tablet (30 mg total) by mouth 2 (two) times daily.      . metoCLOPramide (REGLAN) 5 MG tablet Take 5 mg by mouth 3 (three) times daily.      . metoprolol tartrate (LOPRESSOR) 25 MG tablet Take 6.25 mg by mouth 2 (two) times daily.       [name] omeprazole (PRILOSEC) 20 MG capsule Take 20 mg by mouth daily.      [name] oxyCODONE-acetaminophen (PERCOCET/ROXICET) 5-325 MG per tablet Take 2 tablets by mouth every 6 (six) hours as needed. Take two tablets by mouth every 6 hours as needed for severe pain      . polyethylene glycol (MIRALAX / GLYCOLAX) packet Take 17 g by mouth 2 (two) times daily.      [name] torsemide (DEMADEX) 20 MG tablet Take 40 mg by mouth 2 (two) times daily.      . traZODone (DESYREL) 50 MG tablet Take 25 mg by mouth at bedtime.       No  current facility-administered medications for this encounter.    Filed Vitals:   05/11/14 1053  BP: 137/43  Pulse: 61  Resp: 20  SpO2: 98%    General: Obese, looks older than stated age. Sitting in wheelchair.  Neck: JVP difficult to assess d/t body habitus but appears 8  cm, no thyromegaly or thyroid nodule.  Lungs: Clear to auscultation bilaterally with normal respiratory effort. CV: Nondisplaced PMI.  Heart regular S1/S2, no S3/S4, 1/6 early SEM. No edema;  No carotid bruit.  Normal pedal pulses.  Abdomen: Soft, nontender, no hepatosplenomegaly, mildly distented.  Skin: Intact without lesions or rashes.  Neurologic: Alert and oriented x 3.  Psych: Normal affect. Extremities: No clubbing or cyanosis. No edema. HEENT: Normal.    Assessment/Plan:  1. Chronic diastolic CHF: EF 93-81% grade 1 DD (10/2013) - NYHA III symptoms and volume status appears stable. Very difficult to assess volume status d/t body habitus and patient not able to stand up for weights. Have asked SNF to please weigh her daily in sling and call if weight is trending up. Continue torsemide 40 gm BID. Check BMET and pro-BNP today and will fax results to them. - Reinforced the need and importance of daily weights, a low sodium diet, and fluid restriction (less than 2 L a day). Instructed to call the HF clinic if weight increases more than 3 lbs overnight or 5 lbs in a week.   2. CKD: Last creatinine 2.8, stable. Baseline Cr 2.4-2.8. Does not have renal MD. Will refer to nephrology.  3. Paroxysmal atrial fibrillation: Patient appears to be in NSR on amiodarone.  She is not on coumadin with history of GI bleeding as well as a severe right leg hematoma, both while on coumadin.   - Given amiodarone use, TSH ok and recent LFTs were normal (01/2014).  Should have yearly eye exam and recommended to pt. 4. OHS/OSA: On 2 lters [place]. Is supposed to wear CPAP but does not wear nightly. Continue to follow with Dr. Annamaria Boots.  Follow up  3 months  Rande Brunt NP-C  05/11/2014

## 2014-05-11 ENCOUNTER — Ambulatory Visit (HOSPITAL_COMMUNITY)
Admission: RE | Admit: 2014-05-11 | Discharge: 2014-05-11 | Disposition: A | Discharge status: Home / Self Care | Payer: Medicare HMO | Source: Ambulatory Visit | Attending: Internal Medicine | Admitting: Internal Medicine

## 2014-05-11 VITALS — BP 137/43 | HR 61 | Resp 20

## 2014-05-11 DIAGNOSIS — N184 Chronic kidney disease, stage 4 (severe): Secondary | ICD-10-CM

## 2014-05-11 DIAGNOSIS — I509 Heart failure, unspecified: Secondary | ICD-10-CM | POA: Insufficient documentation

## 2014-05-11 DIAGNOSIS — G4733 Obstructive sleep apnea (adult) (pediatric): Secondary | ICD-10-CM

## 2014-05-11 DIAGNOSIS — N189 Chronic kidney disease, unspecified: Secondary | ICD-10-CM | POA: Insufficient documentation

## 2014-05-11 DIAGNOSIS — Z8673 Personal history of transient ischemic attack (TIA), and cerebral infarction without residual deficits: Secondary | ICD-10-CM | POA: Insufficient documentation

## 2014-05-11 DIAGNOSIS — I5032 Chronic diastolic (congestive) heart failure: Secondary | ICD-10-CM

## 2014-05-11 DIAGNOSIS — I4891 Unspecified atrial fibrillation: Secondary | ICD-10-CM

## 2014-05-11 DIAGNOSIS — I5042 Chronic combined systolic (congestive) and diastolic (congestive) heart failure: Secondary | ICD-10-CM

## 2014-05-11 DIAGNOSIS — I129 Hypertensive chronic kidney disease with stage 1 through stage 4 chronic kidney disease, or unspecified chronic kidney disease: Secondary | ICD-10-CM | POA: Insufficient documentation

## 2014-05-11 LAB — BASIC METABOLIC PANEL
BUN: 71 mg/dL — ABNORMAL HIGH (ref 6–23)
CO2: 31 mEq/L (ref 19–32)
Calcium: 9.4 mg/dL (ref 8.4–10.5)
Chloride: 100 mEq/L (ref 96–112)
Creatinine, Ser: 2.5 mg/dL — ABNORMAL HIGH (ref 0.50–1.10)
GFR calc Af Amer: 22 mL/min — ABNORMAL LOW (ref >90)
GFR, EST NON AFRICAN AMERICAN: 19 mL/min — AB (ref >90)
Glucose, Bld: 112 mg/dL — ABNORMAL HIGH (ref 70–99)
Potassium: 3.7 mEq/L (ref 3.7–5.3)
SODIUM: 144 meq/L (ref 137–147)

## 2014-05-11 LAB — PRO B NATRIURETIC PEPTIDE: Pro B Natriuretic peptide (BNP): 523.9 pg/mL — ABNORMAL HIGH (ref 0–125)

## 2014-05-12 ENCOUNTER — Telehealth (HOSPITAL_COMMUNITY): Payer: Self-pay

## 2014-05-12 NOTE — Telephone Encounter (Signed)
Nurse at [facility] made aware that lab results are being faxed to them.  Asked to hold diuretics for patient for 2 days and have blood work redrawn in 2 weeks.  Rx sent for blood work via fax along with lab results.

## 2014-05-16 ENCOUNTER — Non-Acute Institutional Stay (SKILLED_NURSING_FACILITY): Payer: Medicare HMO | Admitting: Internal Medicine

## 2014-05-16 DIAGNOSIS — I4891 Unspecified atrial fibrillation: Secondary | ICD-10-CM

## 2014-05-16 DIAGNOSIS — E1129 Type 2 diabetes mellitus with other diabetic kidney complication: Secondary | ICD-10-CM

## 2014-05-16 DIAGNOSIS — G4700 Insomnia, unspecified: Secondary | ICD-10-CM

## 2014-05-16 DIAGNOSIS — I509 Heart failure, unspecified: Secondary | ICD-10-CM

## 2014-05-16 NOTE — Progress Notes (Signed)
                PROGRESS NOTE  DATE: 05-16-14  FACILITY: Nursing Home Location: [facility]  LEVEL OF CARE: SNF (31)  Routine Visit  CHIEF COMPLAINT:  Manage insomnia, atrial fibrillation, CHF and diabetes mellitus  HISTORY OF PRESENT ILLNESS:  REASSESSMENT OF ONGOING PROBLEM(S):  INSOMNIA: The insomnia is unstable.  No complications noted from the medications presently being used. Patient complains of ongoing insomnia, but denies pain, hallucinations, delusions.  ATRIAL FIBRILLATION: the patients atrial fibrillation remains stable.  The patient denies DOE, tachycardia, orthopnea, transient neurological sx, palpitations, & PNDs.  No complications noted from the medications currently being used.  CHF:The patient does not relate significant weight changes, denies sob, DOE, orthopnea, PNDs, palpitations or chest pain.  CHF remains stable.  No complications form the medications being used. Complains of chronic lower extremity swelling.  DM:pt's DM remains stable.  Pt denies polyuria, polydipsia, polyphagia, changes in vision or hypoglycemic episodes.  No complications noted from the medication presently being used.  Last hemoglobin A1c is: 5.1 in 4-15.  PAST MEDICAL HISTORY : Reviewed.  No changes/see problem list  CURRENT MEDICATIONS: Reviewed per MAR/see medication list  REVIEW OF SYSTEMS:  GENERAL: no change in appetite, no fatigue, no weight changes, no fever, chills or weakness RESPIRATORY: no cough, SOB, DOE, wheezing, hemoptysis CARDIAC: no chest pain, or palpitations, complains of chronic lower extremity swelling GI: no abdominal pain, diarrhea, constipation, heart burn, nausea or vomiting  PHYSICAL EXAMINATION  VS:  See VS section  GENERAL: no acute distress, morbidly obese body habitus EYES: conjunctivae normal, sclerae normal, normal eye lids NECK: supple, trachea midline, no neck masses, no thyroid tenderness, no thyromegaly LYMPHATICS: no LAN in  the neck, no supraclavicular LAN RESPIRATORY: breathing is even & unlabored, BS CTAB CARDIAC: Heart rate is irregularly irregular, no murmur,no extra heart sounds, +2 bilateral lower extremity edema GI: abdomen soft, normal BS, no masses, no tenderness, no hepatomegaly, no splenomegaly PSYCHIATRIC: the patient is alert & oriented to person, affect & behavior appropriate  LABS/RADIOLOGY: 5-15 hemoglobin 8.6, MCV 85 otherwise CBC normal, BUN 82, creatinine 2.85 otherwise BMP normal 4-15 hemoglobin 8.6, MCV 4 otherwise CBC normal, BUN 79, creatinine 2.86 otherwise BMP normal 3-15 TSH 3.21 2-15 albumin 3.5 otherwise liver profile normal  ASSESSMENT/PLAN:  Atrial fibrillation-rate controlled Diabetes mellitus with renal complications-well controlled CHF-well compensated Chronic kidney disease stage IV-stable Anemia of chronic kidney disease-stable GERD-continue PPI Hyperlipidemia-check fasting lipid panel Gout-no acute episodes Constipation-well controlled Insomnia-uncontrolled problem. Increase trazodone to 50 mg each bedtime  CPT CODE: 40981  Gayani Y Dasanayaka, MD [facility] [phone]

## 2014-05-22 IMAGING — CR DG CHEST 2V
2 series · 2 of 2 positions shown · non-contrast
Comparison: 09/15/2013.

CLINICAL DATA: Preoperative chest x-ray.

EXAM:
CHEST  2 VIEW

[w chest lat]
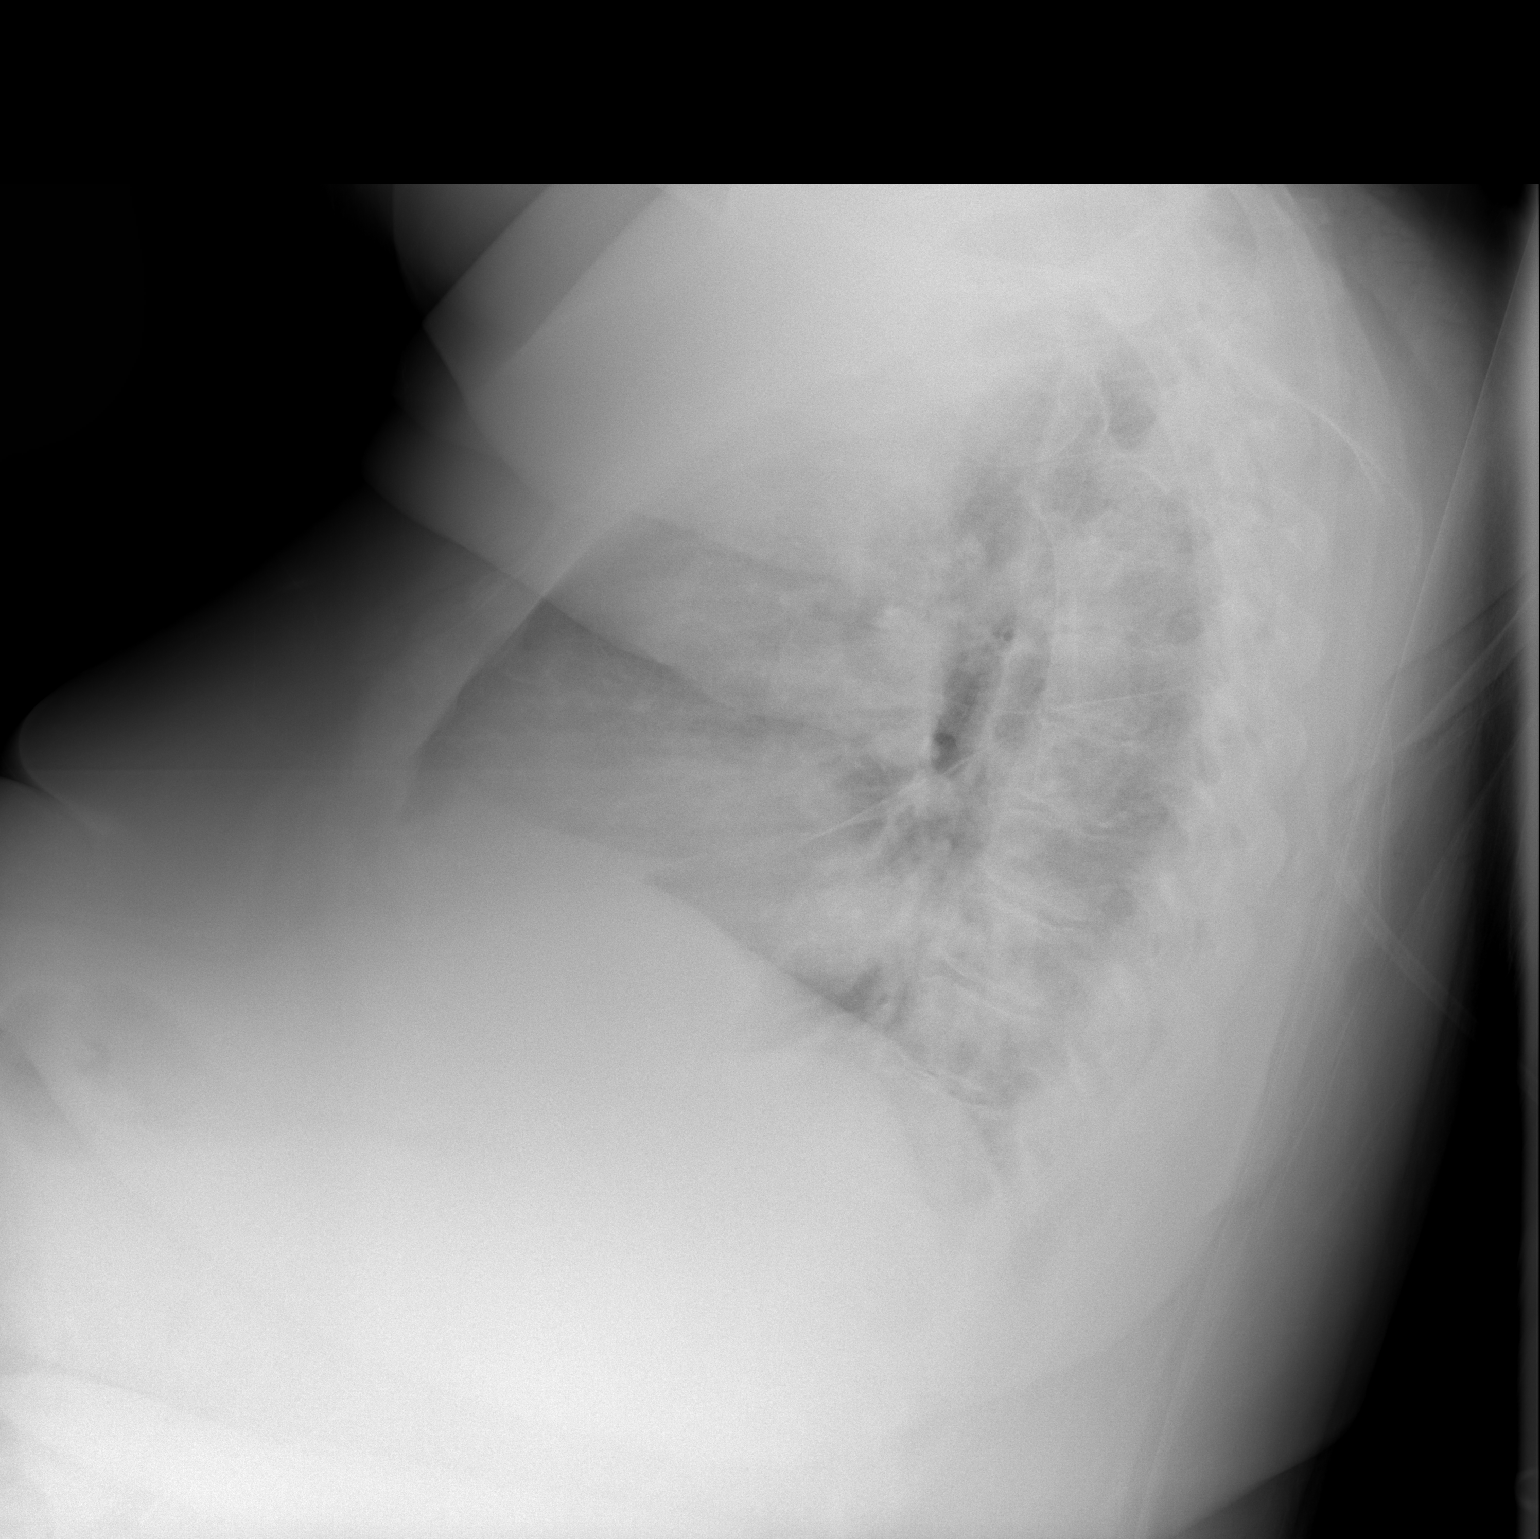

[view not recorded]
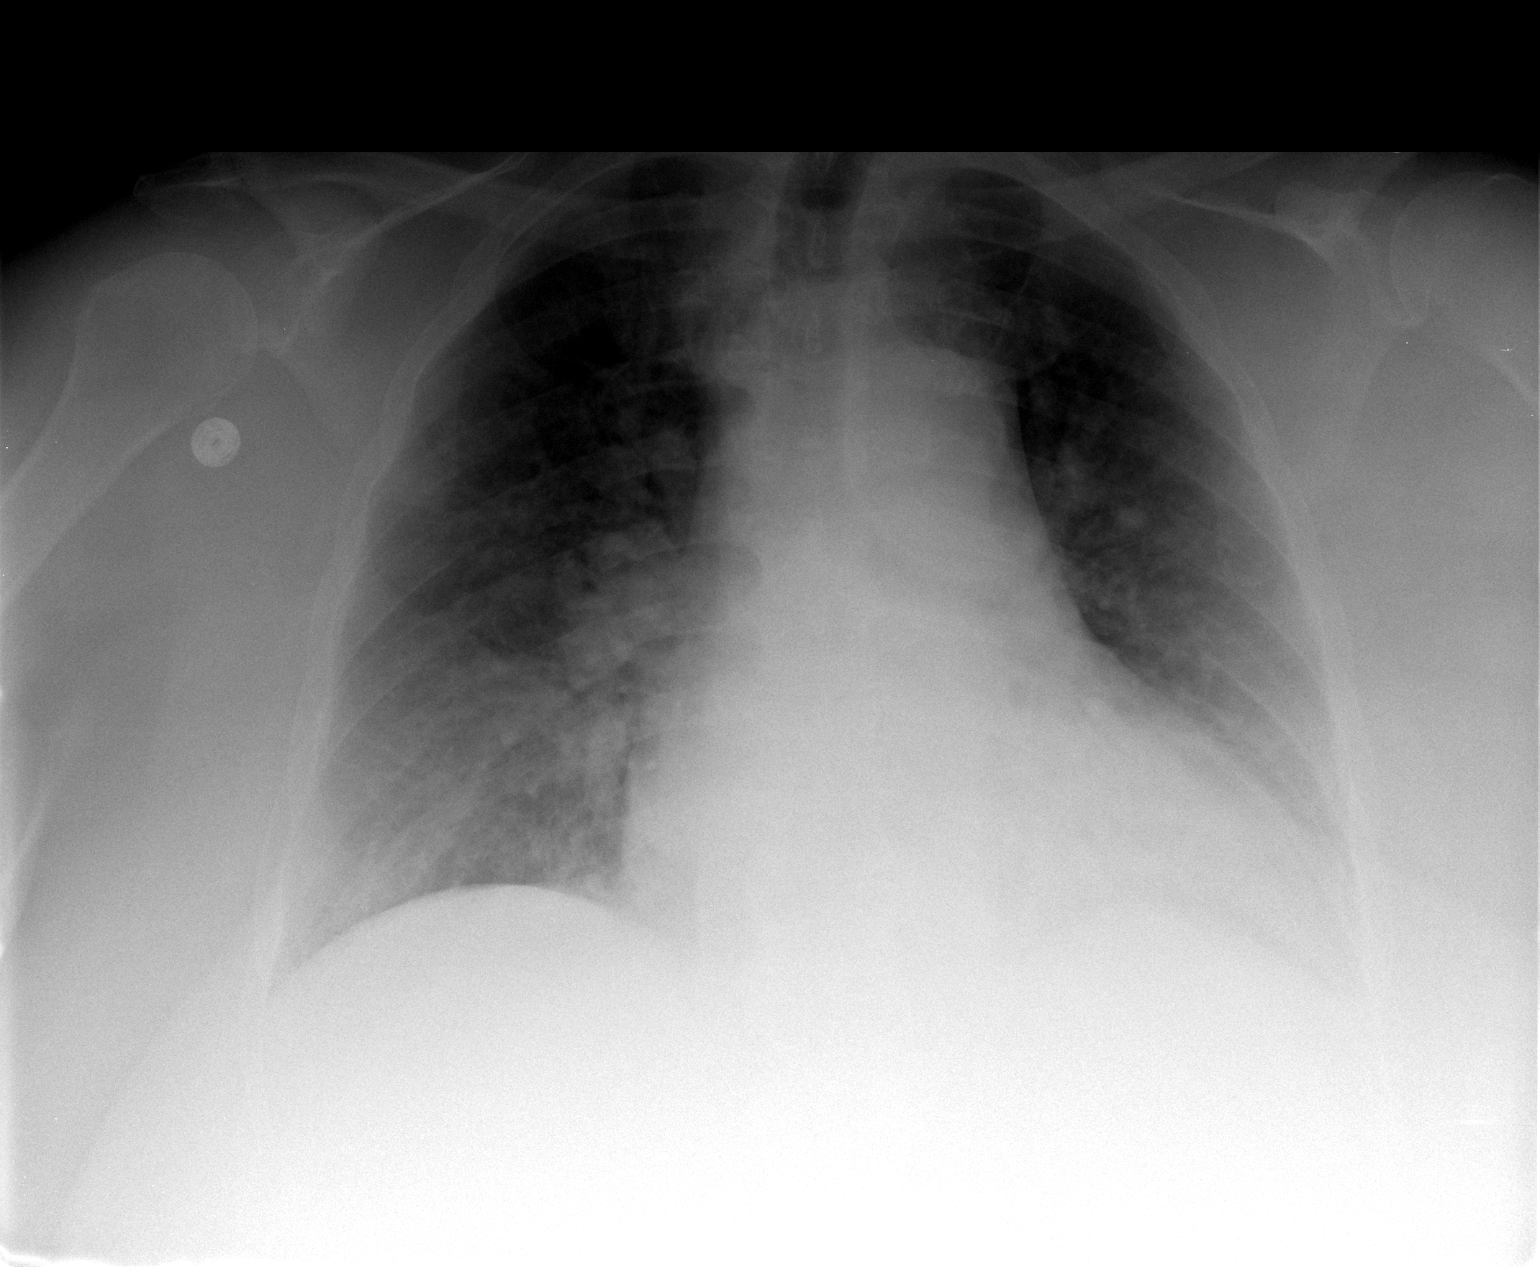

[2 of 2 positions shown; findings below may reference images not displayed]

FINDINGS: The heart is enlarged but stable. The right IJ catheter has been
removed. Persistent vascular congestion centrally with prominent
vasculature. No infiltrates, edema or effusions. Left basilar
density is likely atelectasis. The bony thorax is intact.
IMPRESSION: Cardiac enlargement and central vascular congestion but no overt
pulmonary edema or pleural effusion.

Left basilar density is likely atelectasis.

## 2014-05-27 ENCOUNTER — Non-Acute Institutional Stay (SKILLED_NURSING_FACILITY): Payer: Medicare HMO | Admitting: Internal Medicine

## 2014-05-27 DIAGNOSIS — N184 Chronic kidney disease, stage 4 (severe): Secondary | ICD-10-CM

## 2014-06-02 NOTE — Progress Notes (Signed)
Patient ID: [name], female   DOB: Nov 21, 1951, 63 y.o.   MRN: [id number]            PROGRESS NOTE  DATE: 05/27/2014        FACILITY:  [facility]  LEVEL OF CARE: SNF (31)  Acute Visit  CHIEF COMPLAINT:  Manage chronic kidney disease stage IV.    HISTORY OF PRESENT ILLNESS: I was requested by the staff to assess the patient regarding above problem(s):  CHRONIC KIDNEY DISEASE: The patient's chronic kidney disease remains stable.  Patient denies increasing lower extremity swelling or confusion. Last BUN and creatinine are:   On 05/26/2014:  BUN 87, creatinine 2.62.  In 04/2014:  BUN 82, creatinine 2.85.    PAST MEDICAL HISTORY : Reviewed.  No changes/see problem list  CURRENT MEDICATIONS: Reviewed per MAR/see medication list  PHYSICAL EXAMINATION  VS:  T 98.1       P 73      RR 16      BP 114/60     POX 96%          GENERAL: no acute distress, morbidly obese body habitus RESPIRATORY: breathing is even & unlabored, BS CTAB CARDIAC: RRR, no murmur,no extra heart sounds, +2 bilateral lower extremity edema      ASSESSMENT/PLAN:  Chronic kidney disease stage IV.  Renal functions improved.    CPT CODE: 02542        Gayani Y Dasanayaka, [place] [phone]

## 2014-06-16 ENCOUNTER — Non-Acute Institutional Stay (SKILLED_NURSING_FACILITY): Payer: Medicare HMO | Admitting: Internal Medicine

## 2014-06-16 DIAGNOSIS — L853 Xerosis cutis: Secondary | ICD-10-CM

## 2014-06-16 DIAGNOSIS — L851 Acquired keratosis [keratoderma] palmaris et plantaris: Secondary | ICD-10-CM

## 2014-06-16 DIAGNOSIS — B372 Candidiasis of skin and nail: Secondary | ICD-10-CM

## 2014-06-20 ENCOUNTER — Non-Acute Institutional Stay (SKILLED_NURSING_FACILITY): Payer: Medicare HMO | Admitting: Internal Medicine

## 2014-06-20 DIAGNOSIS — G4700 Insomnia, unspecified: Secondary | ICD-10-CM

## 2014-06-20 DIAGNOSIS — I509 Heart failure, unspecified: Secondary | ICD-10-CM

## 2014-06-20 DIAGNOSIS — I482 Chronic atrial fibrillation, unspecified: Secondary | ICD-10-CM

## 2014-06-20 DIAGNOSIS — E1129 Type 2 diabetes mellitus with other diabetic kidney complication: Secondary | ICD-10-CM

## 2014-06-20 DIAGNOSIS — I4891 Unspecified atrial fibrillation: Secondary | ICD-10-CM

## 2014-06-21 DIAGNOSIS — E1129 Type 2 diabetes mellitus with other diabetic kidney complication: Secondary | ICD-10-CM | POA: Insufficient documentation

## 2014-06-21 DIAGNOSIS — I509 Heart failure, unspecified: Secondary | ICD-10-CM | POA: Insufficient documentation

## 2014-06-21 NOTE — Progress Notes (Signed)
         PROGRESS NOTE  DATE: 06-20-14  FACILITY: Nursing Home Location: [facility]  LEVEL OF CARE: SNF (31)  Routine Visit  CHIEF COMPLAINT:  Manage insomnia, atrial fibrillation, CHF and diabetes mellitus  HISTORY OF PRESENT ILLNESS:  REASSESSMENT OF ONGOING PROBLEM(S):  INSOMNIA: The insomnia is unstable.  No complications noted from the medications presently being used. Patient complains of ongoing insomnia, but denies pain, hallucinations, delusions.  ATRIAL FIBRILLATION: the patients atrial fibrillation remains stable.  The patient denies DOE, tachycardia, orthopnea, transient neurological sx, palpitations, & PNDs.  No complications noted from the medications currently being used.  CHF:The patient does not relate significant weight changes, denies sob, DOE, orthopnea, PNDs, palpitations or chest pain.  CHF remains stable.  No complications form the medications being used. Complains of chronic lower extremity swelling.  DM:pt's DM remains stable.  Pt denies polyuria, polydipsia, polyphagia, changes in vision or hypoglycemic episodes.  No complications noted from the medication presently being used.  Last hemoglobin A1c is: 5.1 in 4-15.  PAST MEDICAL HISTORY : Reviewed.  No changes/see problem list  CURRENT MEDICATIONS: Reviewed per MAR/see medication list  REVIEW OF SYSTEMS:  GENERAL: no change in appetite, no fatigue, no weight changes, no fever, chills or weakness RESPIRATORY: no cough, SOB, DOE, wheezing, hemoptysis CARDIAC: no chest pain, or palpitations, complains of chronic lower extremity swelling GI: no abdominal pain, diarrhea, constipation, complaints of heart burn, no nausea or vomiting  PHYSICAL EXAMINATION  VS:  See VS section  GENERAL: no acute distress, morbidly obese body habitus EYES: conjunctivae normal, sclerae normal, normal eye lids NECK: supple, trachea midline, no neck masses, no thyroid tenderness, no  thyromegaly LYMPHATICS: no LAN in the neck, no supraclavicular LAN RESPIRATORY: breathing is even & unlabored, BS CTAB CARDIAC: Heart rate is irregularly irregular, no murmur,no extra heart sounds, +2 bilateral lower extremity edema GI: abdomen soft, normal BS, no masses, no tenderness, no hepatomegaly, no splenomegaly PSYCHIATRIC: the patient is alert & oriented to person, affect & behavior appropriate  LABS/RADIOLOGY: 6-15 BUN 87, creatinine 2.62 otherwise BMP normal 5-15 hemoglobin 8.6, MCV 85 otherwise CBC normal, BUN 82, creatinine 2.85 otherwise BMP normal 4-15 hemoglobin 8.6, MCV 4 otherwise CBC normal, BUN 79, creatinine 2.86 otherwise BMP normal 3-15 TSH 3.21 2-15 albumin 3.5 otherwise liver profile normal  ASSESSMENT/PLAN:  Atrial fibrillation-rate controlled Diabetes mellitus with renal complications-well controlled CHF-well compensated Chronic kidney disease stage IV-stable Anemia of chronic kidney disease-stable GERD-uncontrolled. Increase Prilosec to 20 mg twice a day Hyperlipidemia-check fasting lipid panel Gout-no acute episodes Constipation-well controlled Insomnia-continue trazodone  CPT CODE: 67893  Gayani Y Dasanayaka, [place] [phone]

## 2014-06-23 DIAGNOSIS — B372 Candidiasis of skin and nail: Secondary | ICD-10-CM | POA: Insufficient documentation

## 2014-06-23 NOTE — Progress Notes (Signed)
Patient ID: [name], female   DOB: 04-Jun-1951, 63 y.o.   MRN: [id number]           PROGRESS NOTE  DATE: 06/16/2014          FACILITY:  [facility]  LEVEL OF CARE: SNF (31)  Acute Visit  CHIEF COMPLAINT:  Manage bilateral under-breast rash and dry facial skin.     HISTORY OF PRESENT ILLNESS: I was requested by the staff to assess the patient regarding above problem(s):  BILATERAL UNDER-BREAST RASH:  New problem.  Patient is complaining of rash under bilateral breasts for several days and some itching.  She cannot identify precipitating or alleviating factors.  There is no temporal relationship.    DRY FACIAL SKIN:  The patient is complaining of dry skin on her face.    PAST MEDICAL HISTORY : Reviewed.  No changes/see problem list  CURRENT MEDICATIONS: Reviewed per MAR/see medication list  REVIEW OF SYSTEMS:  GENERAL: no change in appetite, no fatigue, no weight changes, no fever, chills or weakness RESPIRATORY: no cough, SOB, DOE,, wheezing, hemoptysis CARDIAC: no chest pain or palpitations; chronic swelling     GI: no abdominal pain, diarrhea, constipation, heart burn, nausea or vomiting  PHYSICAL EXAMINATION  VS:  T 96.8       P 50      RR 18      BP 130/56           GENERAL: no acute distress, morbidly obese body habitus NECK: supple, trachea midline, no neck masses, no thyroid tenderness, no thyromegaly RESPIRATORY: breathing is even & unlabored, BS CTAB CARDIAC: RRR, no murmur,no extra heart sounds, +2 bilateral lower extremity edema     GI: abdomen soft, normal BS, no masses, no tenderness, no hepatomegaly, no splenomegaly PSYCHIATRIC: the patient is alert & oriented to person, affect & behavior appropriate SKIN:  INSPECTION:  Bilateral under-breasts have erythema.  Facial skin appears somewhat dry.     ASSESSMENT/PLAN:  Bilateral under-breasts candidiasis.  New problem.  Start Nystatin cream q.d. for one week.    Dry facial skin.   Start Cetaphil cream q.d.    CPT CODE: 40347         [name], [name] [phone]

## 2014-07-18 ENCOUNTER — Non-Acute Institutional Stay (SKILLED_NURSING_FACILITY): Payer: Medicare HMO | Admitting: Internal Medicine

## 2014-07-18 DIAGNOSIS — I4891 Unspecified atrial fibrillation: Secondary | ICD-10-CM

## 2014-07-18 DIAGNOSIS — E1129 Type 2 diabetes mellitus with other diabetic kidney complication: Secondary | ICD-10-CM

## 2014-07-18 DIAGNOSIS — I482 Chronic atrial fibrillation, unspecified: Secondary | ICD-10-CM

## 2014-07-18 DIAGNOSIS — I509 Heart failure, unspecified: Secondary | ICD-10-CM

## 2014-07-18 DIAGNOSIS — N184 Chronic kidney disease, stage 4 (severe): Secondary | ICD-10-CM

## 2014-07-19 NOTE — Progress Notes (Signed)
         PROGRESS NOTE  DATE: 07-18-14  FACILITY: Nursing Home Location: [facility]  LEVEL OF CARE: SNF (31)  Routine Visit  CHIEF COMPLAINT:  Manage insomnia, atrial fibrillation, CHF and diabetes mellitus  HISTORY OF PRESENT ILLNESS:  REASSESSMENT OF ONGOING PROBLEM(S):  INSOMNIA: The insomnia is unstable.  No complications noted from the medications presently being used. Patient complains of ongoing insomnia, but denies pain, hallucinations, delusions.  ATRIAL FIBRILLATION: the patients atrial fibrillation remains stable.  The patient denies DOE, tachycardia, orthopnea, transient neurological sx, palpitations, & PNDs.  No complications noted from the medications currently being used.  CHF:The patient does not relate significant weight changes, denies sob, DOE, orthopnea, PNDs, palpitations or chest pain.  CHF remains stable.  No complications form the medications being used. Complains of chronic lower extremity swelling.  DM:pt's DM remains stable.  Pt denies polyuria, polydipsia, polyphagia, changes in vision or hypoglycemic episodes.  No complications noted from the medication presently being used.  Last hemoglobin A1c is: 5.1 in 4-15.  PAST MEDICAL HISTORY : Reviewed.  No changes/see problem list  CURRENT MEDICATIONS: Reviewed per MAR/see medication list  REVIEW OF SYSTEMS:  GENERAL: no change in appetite, no fatigue, no weight changes, no fever, chills or weakness RESPIRATORY: no cough, SOB, DOE, wheezing, hemoptysis CARDIAC: no chest pain, or palpitations, complains of chronic lower extremity swelling GI: no abdominal pain, diarrhea, constipation, complaints of heart burn, no nausea or vomiting  PHYSICAL EXAMINATION  VS:  See VS section  GENERAL: no acute distress, morbidly obese body habitus NECK: supple, trachea midline, no neck masses, no thyroid tenderness, no thyromegaly RESPIRATORY: breathing is even & unlabored, BS CTAB CARDIAC: Heart  rate is irregularly irregular, no murmur,no extra heart sounds, +2 bilateral lower extremity edema GI: abdomen soft, normal BS, no masses, no tenderness, no hepatomegaly, no splenomegaly PSYCHIATRIC: the patient is alert & oriented to person, affect & behavior appropriate  LABS/RADIOLOGY: 7-15 HDL 32 otherwise fasting lipid panel normal 6-15 BUN 87, creatinine 2.62 otherwise BMP normal 5-15 hemoglobin 8.6, MCV 85 otherwise CBC normal, BUN 82, creatinine 2.85 otherwise BMP normal 4-15 hemoglobin 8.6, MCV 4 otherwise CBC normal, BUN 79, creatinine 2.86 otherwise BMP normal 3-15 TSH 3.21 2-15 albumin 3.5 otherwise liver profile normal  ASSESSMENT/PLAN:  Atrial fibrillation-rate controlled Diabetes mellitus with renal complications-well controlled CHF-well compensated Chronic kidney disease stage IV-stable Anemia of chronic kidney disease-stable GERD-uncontrolled. Prilosec increased Hyperlipidemia-well controlled Gout-no acute episodes Constipation-well controlled Insomnia-continue trazodone Check liver profile  CPT CODE: 19758  Nancy Kwasnik Y Sulma Ruffino, MD [place] [phone]

## 2014-07-25 IMAGING — CR DG CHEST 1V PORT
1 series · 1 of 1 positions shown · non-contrast
Comparison: 12/06/2013

CLINICAL DATA: Shortness of breath.

EXAM:
PORTABLE CHEST - 1 VIEW

[AP]
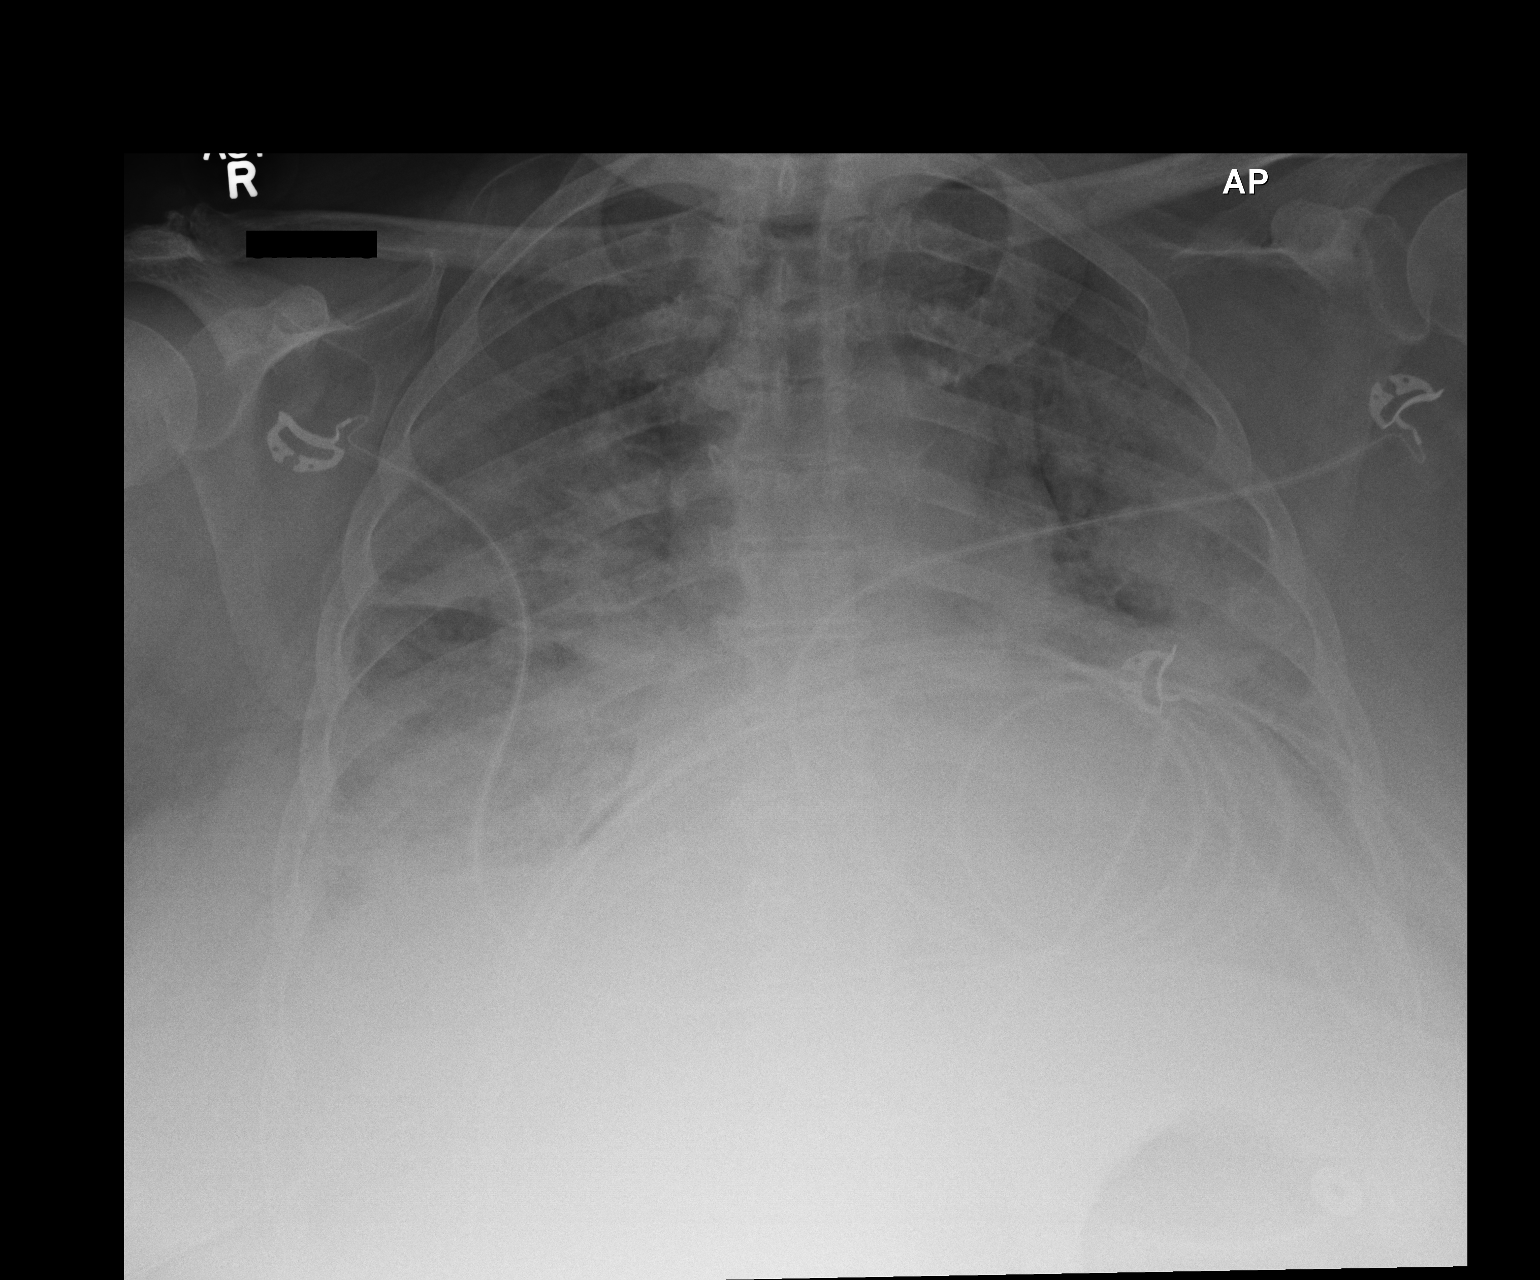

[1 of 1 positions shown; findings below may reference images not displayed]

FINDINGS: Lungs are adequately inflated with slight worsening of diffuse
bilateral airspace process likely infection and less likely edema.
This process has worsened over the left mid to upper lung and right
upper lobe. Cannot exclude a small mild pleural fluid in the lung
bases. Stable cardiomegaly. Remainder the exam is unchanged.
IMPRESSION: Worsening bilateral diffuse airspace process likely infection and
less likely edema. Cannot exclude a small amount of bilateral
pleural fluid.

Stable cardiomegaly.

## 2014-07-26 IMAGING — CR DG CHEST 1V PORT
1 series · 1 of 1 positions shown · non-contrast
Comparison: 12/10/2013.

CLINICAL DATA: Evaluate edema.

EXAM:
PORTABLE CHEST - 1 VIEW

[AP]
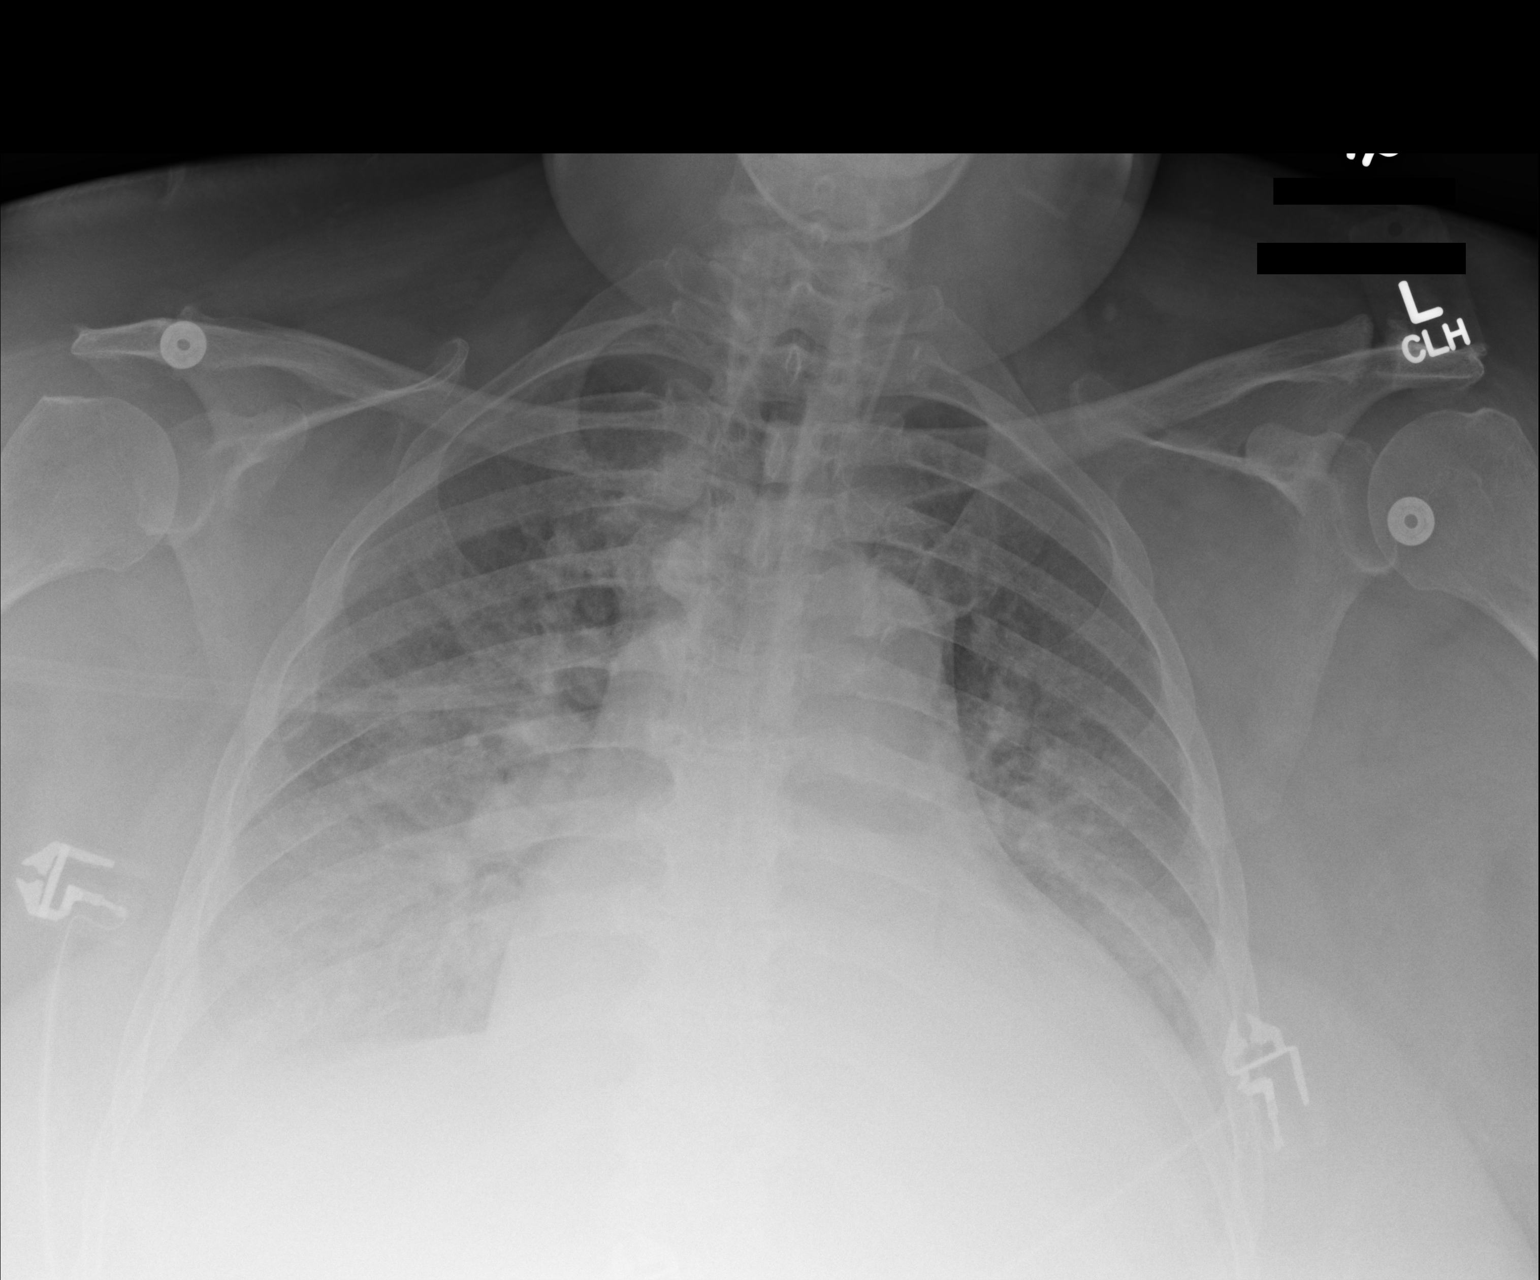

[1 of 1 positions shown; findings below may reference images not displayed]

FINDINGS: Decreased diffuse airspace disease, although still extensive and
dense. Haziness of the lower chest suggesting pleural fluid.
Unchanged cardiac enlargement. No evidence of pneumothorax.
IMPRESSION: Improved, but still extensive, pulmonary edema.

## 2014-07-27 IMAGING — CR DG CHEST 1V PORT
1 series · 1 of 1 positions shown · non-contrast
Comparison: 12/11/2013 and 12/10/2013.

CLINICAL DATA: Follow up edema.

EXAM:
PORTABLE CHEST - 1 VIEW

[AP]
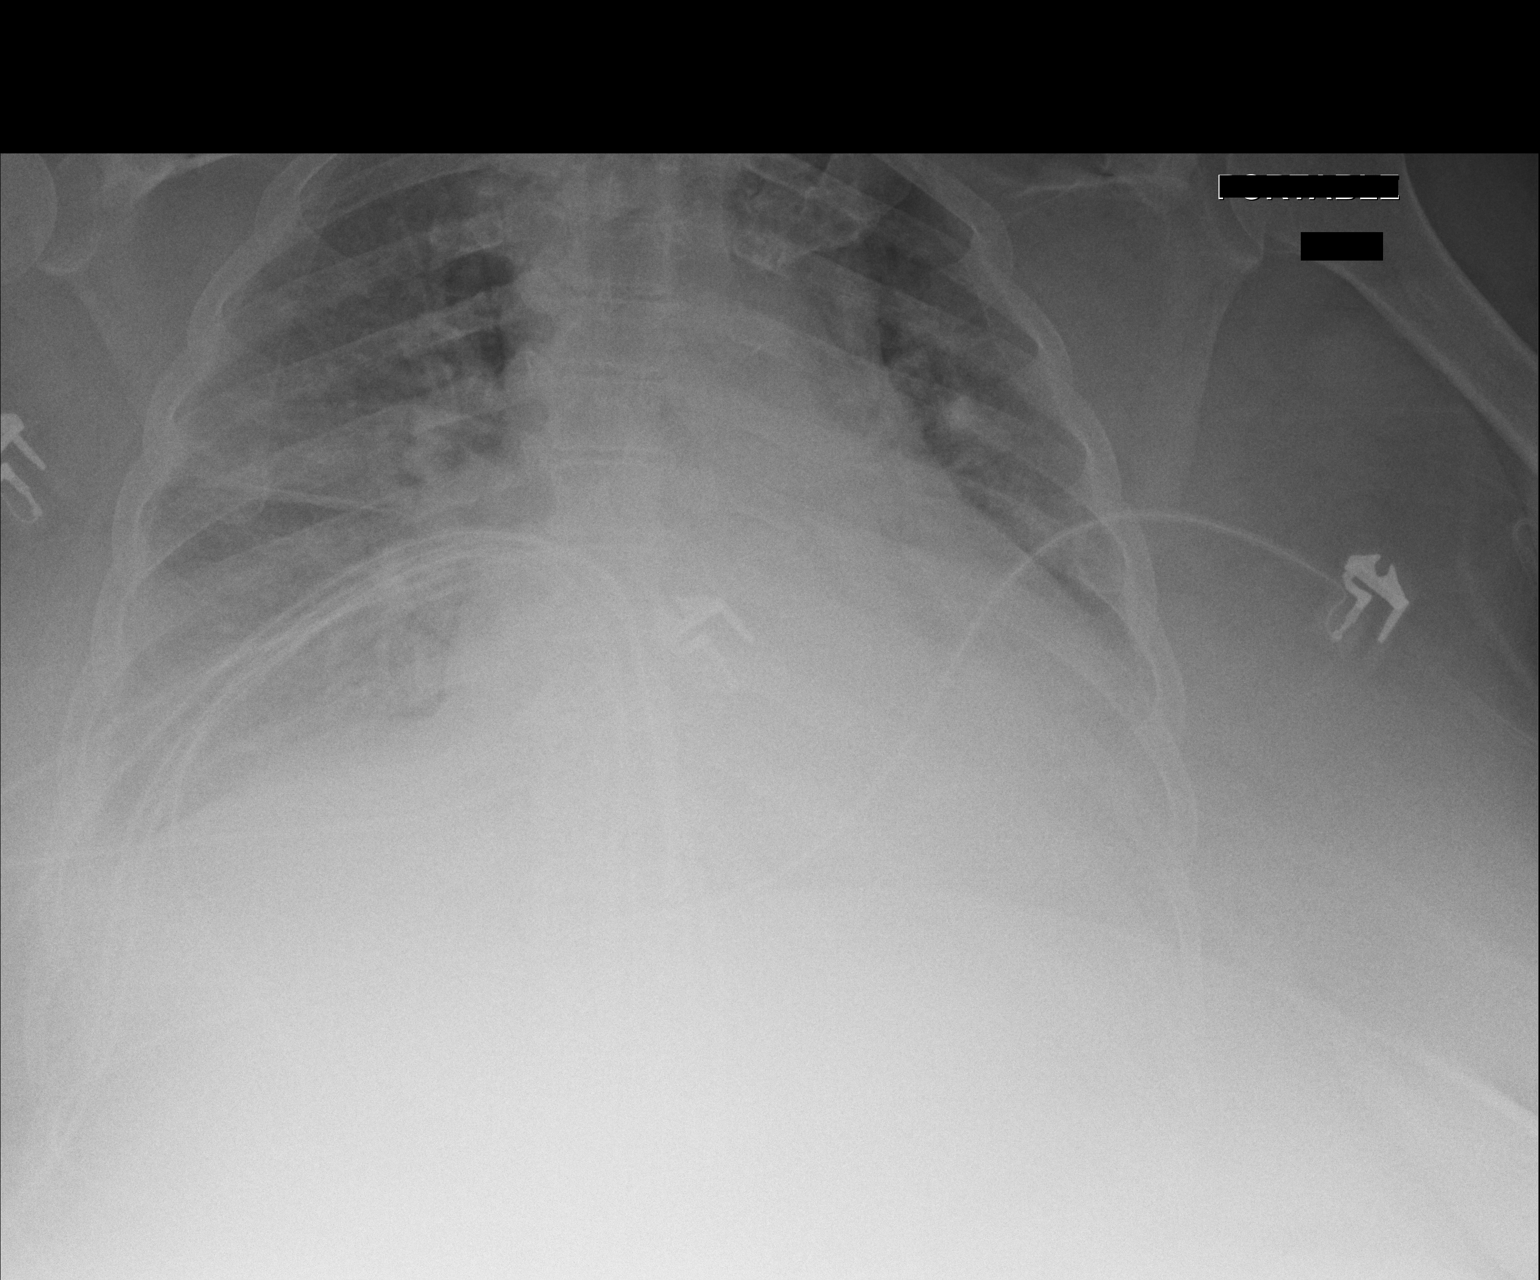

[1 of 1 positions shown; findings below may reference images not displayed]

FINDINGS: 0266 hr. There has been further slight improvement in the diffuse
bilateral airspace opacities. Small bilateral pleural effusions are
likely. There is stable cardiomegaly. No pneumothorax is identified.
Diffuse prominence of the overlying soft tissues is again noted.
IMPRESSION: Further slight improvement in diffuse pulmonary opacities,
consistent with mild improvement in pulmonary edema.

## 2014-08-10 ENCOUNTER — Non-Acute Institutional Stay (SKILLED_NURSING_FACILITY): Payer: Medicare HMO | Admitting: Internal Medicine

## 2014-08-10 DIAGNOSIS — I482 Chronic atrial fibrillation, unspecified: Secondary | ICD-10-CM

## 2014-08-10 DIAGNOSIS — I509 Heart failure, unspecified: Secondary | ICD-10-CM

## 2014-08-10 DIAGNOSIS — E1129 Type 2 diabetes mellitus with other diabetic kidney complication: Secondary | ICD-10-CM

## 2014-08-10 DIAGNOSIS — I4891 Unspecified atrial fibrillation: Secondary | ICD-10-CM

## 2014-08-10 DIAGNOSIS — G4700 Insomnia, unspecified: Secondary | ICD-10-CM

## 2014-08-11 ENCOUNTER — Inpatient Hospital Stay (HOSPITAL_COMMUNITY): Admission: RE | Admit: 2014-08-11 | Payer: Medicare HMO | Source: Ambulatory Visit

## 2014-08-12 NOTE — Progress Notes (Signed)
         PROGRESS NOTE  DATE: 08-10-14  FACILITY: Nursing Home Location: [facility]  LEVEL OF CARE: SNF (31)  Routine Visit  CHIEF COMPLAINT:  Manage insomnia, atrial fibrillation, CHF and diabetes mellitus  HISTORY OF PRESENT ILLNESS:  REASSESSMENT OF ONGOING PROBLEM(S):  INSOMNIA: The insomnia is stable.  No complications noted from the medications presently being used. Patient denies ongoing insomnia, pain, hallucinations, delusions.  ATRIAL FIBRILLATION: the patients atrial fibrillation remains stable.  The patient denies DOE, tachycardia, orthopnea, transient neurological sx, palpitations, & PNDs.  No complications noted from the medications currently being used.  CHF:The patient does not relate significant weight changes, denies sob, DOE, orthopnea, PNDs, palpitations or chest pain.  CHF remains stable.  No complications form the medications being used. Complains of chronic lower extremity swelling.  DM:pt's DM remains stable.  Pt denies polyuria, polydipsia, polyphagia, changes in vision or hypoglycemic episodes.  No complications noted from the medication presently being used.  Last hemoglobin A1c is: 5.1 in 4-15.  PAST MEDICAL HISTORY : Reviewed.  No changes/see problem list  CURRENT MEDICATIONS: Reviewed per MAR/see medication list  REVIEW OF SYSTEMS:  GENERAL: no change in appetite, no fatigue, no weight changes, no fever, chills or weakness RESPIRATORY: no cough, SOB, DOE, wheezing, hemoptysis CARDIAC: no chest pain, or palpitations, complains of chronic lower extremity swelling GI: no abdominal pain, diarrhea, constipation, complaints of heart burn, no nausea or vomiting  PHYSICAL EXAMINATION  VS:  See VS section  GENERAL: no acute distress, morbidly obese body habitus NECK: supple, trachea midline, no neck masses, no thyroid tenderness, no thyromegaly RESPIRATORY: breathing is even & unlabored, BS CTAB CARDIAC: Heart rate is irregularly  irregular, no murmur,no extra heart sounds, +2 bilateral lower extremity edema GI: abdomen soft, normal BS, no masses, no tenderness, no hepatomegaly, no splenomegaly PSYCHIATRIC: the patient is alert & oriented to person, affect & behavior appropriate  LABS/RADIOLOGY: 8-15 albumin 3.2 otherwise liver profile normal 7-15 HDL 32 otherwise fasting lipid panel normal 6-15 BUN 87, creatinine 2.62 otherwise BMP normal 5-15 hemoglobin 8.6, MCV 85 otherwise CBC normal, BUN 82, creatinine 2.85 otherwise BMP normal 4-15 hemoglobin 8.6, MCV 4 otherwise CBC normal, BUN 79, creatinine 2.86 otherwise BMP normal 3-15 TSH 3.21 2-15 albumin 3.5 otherwise liver profile normal  ASSESSMENT/PLAN:  Atrial fibrillation-rate controlled Diabetes mellitus with renal complications-well controlled CHF-well compensated Chronic kidney disease stage IV-stable Anemia of chronic kidney disease-stable GERD-continue Prilosec Hyperlipidemia-well controlled Gout-no acute episodes Constipation-well controlled Insomnia-continue trazodone  CPT CODE: 65784  Gayani Y Dasanayaka, MD [place] [phone]

## 2014-08-17 ENCOUNTER — Non-Acute Institutional Stay (SKILLED_NURSING_FACILITY): Payer: Medicare HMO | Admitting: Internal Medicine

## 2014-08-17 DIAGNOSIS — L851 Acquired keratosis [keratoderma] palmaris et plantaris: Secondary | ICD-10-CM

## 2014-08-17 DIAGNOSIS — L853 Xerosis cutis: Secondary | ICD-10-CM

## 2014-08-17 DIAGNOSIS — G4700 Insomnia, unspecified: Secondary | ICD-10-CM

## 2014-08-24 ENCOUNTER — Non-Acute Institutional Stay (SKILLED_NURSING_FACILITY): Payer: Medicare HMO | Admitting: Internal Medicine

## 2014-08-24 DIAGNOSIS — N039 Chronic nephritic syndrome with unspecified morphologic changes: Principal | ICD-10-CM

## 2014-08-24 DIAGNOSIS — N184 Chronic kidney disease, stage 4 (severe): Secondary | ICD-10-CM

## 2014-08-24 DIAGNOSIS — D631 Anemia in chronic kidney disease: Secondary | ICD-10-CM

## 2014-08-24 NOTE — Progress Notes (Signed)
Patient ID: [name], female   DOB: Mar 07, 1951, 63 y.o.   MRN: [id number]           PROGRESS NOTE  DATE: 08/17/2014         FACILITY:  [facility]  LEVEL OF CARE: SNF (31)  Acute Visit  CHIEF COMPLAINT:  Manage insomnia.     HISTORY OF PRESENT ILLNESS: I was requested by the staff to assess the patient regarding above problem(s):  INSOMNIA: The insomnia is unstable.  No complications noted from the medications presently being used. Patient denies pain, hallucinations, delusions.  I was requested by the pharmacy consultant to assess the patient for possible dose reduction of trazodone.   Patient denies depressive symptoms, but complains of ongoing insomnia and would like a sleep aid.    PAST MEDICAL HISTORY : Reviewed.  No changes/see problem list  CURRENT MEDICATIONS: Reviewed per MAR/see medication list  REVIEW OF SYSTEMS:  GENERAL: no change in appetite, no fatigue, no weight changes, no fever, chills or weakness RESPIRATORY: no cough, SOB, DOE,, wheezing, hemoptysis CARDIAC: no chest pain or palpitations;  chronic lower extremity swelling      GI: no abdominal pain, diarrhea, constipation, heart burn, nausea or vomiting  PHYSICAL EXAMINATION  VS: see VS section   GENERAL: no acute distress, morbidly obese body habitus NECK: supple, trachea midline, no neck masses, no thyroid tenderness, no thyromegaly RESPIRATORY: breathing is even & unlabored, BS CTAB CARDIAC: RRR, no murmur,no extra heart sounds, +2 bilateral lower extremity edema       GI: abdomen soft, normal BS, no masses, no tenderness, no hepatomegaly, no splenomegaly PSYCHIATRIC: the patient is alert & oriented to person, affect & behavior appropriate  ASSESSMENT/PLAN:  Insomnia.  Unstable problem.  Increase trazodone to 50 mg q.h.s.    Dry skin.  Patient is complaining of dry facial skin.  Will start cetaphil cream q.d.       CPT CODE: 95320            [name] Y Sharnay Cashion,  [place] [phone]

## 2014-08-26 ENCOUNTER — Non-Acute Institutional Stay (SKILLED_NURSING_FACILITY): Payer: Medicare HMO | Admitting: Internal Medicine

## 2014-08-26 DIAGNOSIS — D631 Anemia in chronic kidney disease: Secondary | ICD-10-CM

## 2014-08-26 DIAGNOSIS — N039 Chronic nephritic syndrome with unspecified morphologic changes: Secondary | ICD-10-CM

## 2014-08-26 DIAGNOSIS — N184 Chronic kidney disease, stage 4 (severe): Secondary | ICD-10-CM

## 2014-08-31 ENCOUNTER — Non-Acute Institutional Stay (SKILLED_NURSING_FACILITY): Payer: Medicare HMO | Admitting: Internal Medicine

## 2014-08-31 DIAGNOSIS — J189 Pneumonia, unspecified organism: Secondary | ICD-10-CM

## 2014-08-31 DIAGNOSIS — D631 Anemia in chronic kidney disease: Secondary | ICD-10-CM | POA: Insufficient documentation

## 2014-08-31 DIAGNOSIS — N039 Chronic nephritic syndrome with unspecified morphologic changes: Principal | ICD-10-CM

## 2014-08-31 NOTE — Progress Notes (Addendum)
Patient ID: [name], female   DOB: December 23, 1950, 63 y.o.   MRN: [id number]           PROGRESS NOTE  DATE: 08/26/2014            FACILITY:  [facility]  LEVEL OF CARE: SNF (31)  Acute Visit  CHIEF COMPLAINT:  Manage chronic kidney disease stage IV and anemia of chronic kidney disease.    HISTORY OF PRESENT ILLNESS: I was requested by the staff to assess the patient regarding above problem(s):  CHRONIC KIDNEY DISEASE: The patient's chronic kidney disease is unstable.  Patient denies increasing lower extremity swelling or confusion. Last BUN and creatinine are:   On 08/25/2014:  BUN 86, creatinine 3.4.  In 05/2014:  BUN 87, creatinine 2.62.  She is on Demadex 40 mg b.i.d.      ANEMIA: The anemia is unstable. The patient denies fatigue, melena or hematochezia. No complications from the medications currently being used.  On 08/25/2014:  Hemoglobin 8, MCV 91.  In 04/2014:  Hemoglobin 8.6.  Her anemia is secondary to chronic kidney disease.    PAST MEDICAL HISTORY : Reviewed.  No changes/see problem list  CURRENT MEDICATIONS: Reviewed per MAR/see medication list  REVIEW OF SYSTEMS:  GENERAL: no change in appetite, no fatigue, no weight changes, no fever, chills or weakness RESPIRATORY: no cough, SOB, DOE,, wheezing, hemoptysis CARDIAC: no chest pain or palpitations;  chronic lower extremity swelling      GI: no abdominal pain, diarrhea, constipation, heart burn, nausea or vomiting  PHYSICAL EXAMINATION  VS: see VS section  GENERAL: no acute distress, morbidly obese body habitus EYES: conjunctivae normal, sclerae normal, normal eye lids NECK: supple, trachea midline, no neck masses, no thyroid tenderness, no thyromegaly LYMPHATICS: no LAN in the neck, no supraclavicular LAN RESPIRATORY: breathing is even & unlabored, BS CTAB CARDIAC: RRR, no murmur,no extra heart sounds, +2 bilateral lower extremity edema        GI: abdomen soft, normal BS, no masses, no  tenderness, no hepatomegaly, no splenomegaly PSYCHIATRIC: the patient is alert & oriented to person, affect & behavior appropriate  ASSESSMENT/PLAN:    Chronic kidney disease stage IV.  Unstable problem.  Decrease Demadex to 40 mg q.d.  Recheck renal functions on 08/29/2014.    Anemia of chronic kidney disease.  Unstable problem.  Hemoglobin declined.  Recheck in one week.      CPT CODE: 97416             Reena Borromeo Y Aaryana Betke, [place] [phone]

## 2014-08-31 NOTE — Progress Notes (Signed)
Patient ID: [name], female   DOB: 09-20-1951, 63 y.o.   MRN: [id number]           PROGRESS NOTE  DATE: 08/24/2014            FACILITY:  [facility]  LEVEL OF CARE: SNF (31)  Acute Visit  CHIEF COMPLAINT:  Manage anemia of chronic kidney disease and chronic kidney disease stage IV.    HISTORY OF PRESENT ILLNESS: I was requested by the staff to assess the patient regarding above problem(s):  ANEMIA: The anemia is unstable. The patient denies fatigue, melena or hematochezia. No complications from the medications currently being used.  On 08/19/2014:  Hemoglobin 7.8, MCV 92.  In 04/2014:  Hemoglobin 8.6.    CHRONIC KIDNEY DISEASE: The patient's chronic kidney disease is unstable.  Patient denies increasing lower extremity swelling or confusion. Last BUN and creatinine are:  On 08/19/2014:  BUN 76, creatinine 2.99.  In 04/2014:  BUN 82, creatinine 2.62.  The patient has a history of chronic kidney disease stage IV.    PAST MEDICAL HISTORY : Reviewed.  No changes/see problem list  CURRENT MEDICATIONS: Reviewed per MAR/see medication list  REVIEW OF SYSTEMS:  GENERAL: no change in appetite, no fatigue, no weight changes, no fever, chills or weakness RESPIRATORY: no cough, SOB, DOE,, wheezing, hemoptysis CARDIAC: no chest pain, edema or palpitations GI: no abdominal pain, diarrhea, constipation, heart burn, nausea or vomiting  PHYSICAL EXAMINATION  VS: see VS section  GENERAL: no acute distress, morbidly obese body habitus EYES: conjunctivae normal, sclerae normal, normal eye lids NECK: supple, trachea midline, no neck masses, no thyroid tenderness, no thyromegaly LYMPHATICS: no LAN in the neck, no supraclavicular LAN RESPIRATORY: breathing is even & unlabored, BS CTAB CARDIAC: RRR, no murmur,no extra heart sounds, +2 bilateral lower extremity edema     GI: abdomen soft, normal BS, no masses, no tenderness, no hepatomegaly, no splenomegaly PSYCHIATRIC:  the patient is alert & oriented to person, affect & behavior appropriate  ASSESSMENT/PLAN:  Anemia of chronic kidney disease.  Unstable problem.  Hemoglobin declined.  Recheck.    Chronic kidney disease stage IV.  Unstable problem.  Recheck.  Patient is on Demadex.    CPT CODE: 58727            Kiah Keay Y Cono Gebhard, [place] [phone]

## 2014-09-05 ENCOUNTER — Non-Acute Institutional Stay (SKILLED_NURSING_FACILITY): Payer: Medicare HMO | Admitting: Internal Medicine

## 2014-09-05 DIAGNOSIS — N184 Chronic kidney disease, stage 4 (severe): Secondary | ICD-10-CM

## 2014-09-05 DIAGNOSIS — J209 Acute bronchitis, unspecified: Secondary | ICD-10-CM

## 2014-09-05 DIAGNOSIS — J449 Chronic obstructive pulmonary disease, unspecified: Secondary | ICD-10-CM

## 2014-09-06 NOTE — Progress Notes (Signed)
Patient ID: [name], female   DOB: Mar 24, 1951, 63 y.o.   MRN: [id number]           PROGRESS NOTE  DATE: 08/31/2014           FACILITY:  [facility]  LEVEL OF CARE: SNF (31)  Acute Visit  CHIEF COMPLAINT:  Manage cough and low-grade fever.    HISTORY OF PRESENT ILLNESS: I was requested by the staff to assess the patient regarding above problem(s):  Patient is complaining of a productive cough for three days and increased shortness of breath.  She denies fever or respiratory insufficiency.   She cannot identify precipitating or alleviating factors.  There is no temporal relationship.    PAST MEDICAL HISTORY : Reviewed.  No changes/see problem list  CURRENT MEDICATIONS: Reviewed per MAR/see medication list  REVIEW OF SYSTEMS:  GENERAL: no change in appetite, no fatigue, no weight changes, no fever, chills or weakness RESPIRATORY: see HPI      CARDIAC: no chest pain, edema or palpitations GI: no abdominal pain, diarrhea, constipation, heart burn, nausea or vomiting  PHYSICAL EXAMINATION  VS: see VS section  GENERAL: no acute distress, morbidly obese body habitus EYES: conjunctivae normal, sclerae normal, normal eye lids NECK: supple, trachea midline, no neck masses, no thyroid tenderness, no thyromegaly LYMPHATICS: no LAN in the neck, no supraclavicular LAN RESPIRATORY: diminished breath sounds, breathing is even & unlabored, BS CTAB CARDIAC: RRR, no murmur,no extra heart sounds, no edema GI: abdomen soft, normal BS, no masses, no tenderness, no hepatomegaly, no splenomegaly PSYCHIATRIC: the patient is alert & oriented to person, affect & behavior appropriate  ASSESSMENT/PLAN:  Probable pneumonia.  New problem.  Obtain chest x-ray.  Start Levaquin 500 mg q.d. for seven days and probiotics b.i.d. for 10 days.  Also, start albuterol nebs b.i.d. for 48 hours.    CPT CODE: 73428                  [name], [place] [phone]

## 2014-09-07 DIAGNOSIS — J449 Chronic obstructive pulmonary disease, unspecified: Secondary | ICD-10-CM | POA: Insufficient documentation

## 2014-09-07 DIAGNOSIS — J4489 Other specified chronic obstructive pulmonary disease: Secondary | ICD-10-CM | POA: Insufficient documentation

## 2014-09-07 NOTE — Progress Notes (Signed)
Patient ID: [name], female   DOB: Aug 11, 1951, 63 y.o.   MRN: [id number]           PROGRESS NOTE  DATE: 09/05/2014           FACILITY:  [facility]  LEVEL OF CARE: SNF (31)  Acute Visit  CHIEF COMPLAINT:  Manage chronic kidney disease stage IV and cough.    HISTORY OF PRESENT ILLNESS: I was requested by the staff to assess the patient regarding above problem(s):  CHRONIC KIDNEY DISEASE: The patient's chronic kidney disease remains stable.  Patient denies increasing lower extremity swelling or confusion. Last BUN and creatinine are:   On 08/30/2014:  Patient's BUN was 79, creatinine 3.03.  On 08/19/2014:  BUN 76, creatinine 2.99.  Patient has a history of chronic kidney disease stage IV.    COUGH:  Ongoing problem.  Patient was started on Levaquin last week.  Chest x-ray shows COPD, but no acute infection.   She denies fever, chills or night sweats.    PAST MEDICAL HISTORY : Reviewed.  No changes/see problem list  CURRENT MEDICATIONS: Reviewed per MAR/see medication list  REVIEW OF SYSTEMS:  GENERAL: no change in appetite, no fatigue, no weight changes, no fever, chills or weakness RESPIRATORY: ongoing cough; no SOB, DOE,, wheezing, hemoptysis CARDIAC: no chest pain or palpitations; lower extremity swelling        GI: no abdominal pain, diarrhea, constipation, heart burn, nausea or vomiting  PHYSICAL EXAMINATION  VS: see VS section  GENERAL: no acute distress, morbidly obese body habitus EYES: conjunctivae normal, sclerae normal, normal eye lids NECK: supple, trachea midline, no neck masses, no thyroid tenderness, no thyromegaly LYMPHATICS: no LAN in the neck, no supraclavicular LAN RESPIRATORY: decreased breath sounds, breathing is even & unlabored, BS CTAB CARDIAC: RRR, no murmur,no extra heart sounds, +2 bilateral lower extremity edema       ASSESSMENT/PLAN:  Chronic kidney disease stage IV.  Stable.      Acute bronchitis.  Continue Levaquin  as prescribed.    COPD.  We will add DuoNebs t.i.d. for three days.        CPT CODE: 20100           Gayani Y Dasanayaka, [place] [phone]

## 2014-09-12 ENCOUNTER — Non-Acute Institutional Stay (SKILLED_NURSING_FACILITY): Payer: Medicare HMO | Admitting: Internal Medicine

## 2014-09-12 DIAGNOSIS — I482 Chronic atrial fibrillation, unspecified: Secondary | ICD-10-CM

## 2014-09-12 DIAGNOSIS — I509 Heart failure, unspecified: Secondary | ICD-10-CM

## 2014-09-12 DIAGNOSIS — G4700 Insomnia, unspecified: Secondary | ICD-10-CM

## 2014-09-12 DIAGNOSIS — E1129 Type 2 diabetes mellitus with other diabetic kidney complication: Secondary | ICD-10-CM

## 2014-09-13 NOTE — Progress Notes (Signed)
         PROGRESS NOTE  DATE: 09-12-14  FACILITY: Nursing Home Location: [facility]  LEVEL OF CARE: SNF (31)  Routine Visit  CHIEF COMPLAINT:  Manage insomnia, atrial fibrillation, CHF and diabetes mellitus  HISTORY OF PRESENT ILLNESS:  REASSESSMENT OF ONGOING PROBLEM(S):  INSOMNIA: The insomnia is stable.  No complications noted from the medications presently being used. Patient denies ongoing insomnia, pain, hallucinations, delusions.  ATRIAL FIBRILLATION: the patients atrial fibrillation remains stable.  The patient denies DOE, tachycardia, orthopnea, transient neurological sx, palpitations, & PNDs.  No complications noted from the medications currently being used.  CHF:The patient does not relate significant weight changes, denies sob, DOE, orthopnea, PNDs, palpitations or chest pain.  CHF remains stable.  No complications form the medications being used. Complains of chronic lower extremity swelling.  DM:pt's DM remains stable.  Pt denies polyuria, polydipsia, polyphagia, changes in vision or hypoglycemic episodes.  No complications noted from the medication presently being used.  Last hemoglobin A1c is: 5.1 in 4-15, in 9-15 HbA1c 4.9  PAST MEDICAL HISTORY : Reviewed.  No changes/see problem list  CURRENT MEDICATIONS: Reviewed per MAR/see medication list  REVIEW OF SYSTEMS:  GENERAL: no change in appetite, no fatigue, no weight changes, no fever, chills or weakness RESPIRATORY: no cough, SOB, DOE, wheezing, hemoptysis CARDIAC: no chest pain, or palpitations, complains of chronic lower extremity swelling GI: no abdominal pain, diarrhea, constipation, complaints of heart burn, no nausea or vomiting  PHYSICAL EXAMINATION  VS:  See VS section  GENERAL: no acute distress, morbidly obese body habitus EYES: normal sclerae, normal conjunctivae, no discharge NECK: supple, trachea midline, no neck masses, no thyroid tenderness, no thyromegaly LYMPHATICS:  no cervical LAN, no supraclavicular LAN RESPIRATORY: breathing is even & unlabored, BS CTAB CARDIAC: Heart rate is irregularly irregular, no murmur,no extra heart sounds, +2 bilateral lower extremity edema GI: abdomen soft, normal BS, no masses, no tenderness, no hepatomegaly, no splenomegaly PSYCHIATRIC: the patient is alert & oriented to person, affect & behavior appropriate  LABS/RADIOLOGY:  9-15 bun 79, cr 3.03, Hb 7.8, mcv 92 ow cbc nl, bun 76, cr 2.99, tp 5.7, alb 3 ow cmp nl, HDL 36 ow FLP normal 8-15 albumin 3.2 otherwise liver profile normal 7-15 HDL 32 otherwise fasting lipid panel normal 6-15 BUN 87, creatinine 2.62 otherwise BMP normal 5-15 hemoglobin 8.6, MCV 85 otherwise CBC normal, BUN 82, creatinine 2.85 otherwise BMP normal 4-15 hemoglobin 8.6, MCV 4 otherwise CBC normal, BUN 79, creatinine 2.86 otherwise BMP normal 3-15 TSH 3.21 2-15 albumin 3.5 otherwise liver profile normal  ASSESSMENT/PLAN:  Atrial fibrillation-rate controlled Diabetes mellitus with renal complications-well controlled CHF-well compensated Chronic kidney disease stage IV-stable Anemia of chronic kidney disease-Hb declined.  Recheck pending. GERD-continue Prilosec Hyperlipidemia-well controlled Gout-no acute episodes Constipation-well controlled Insomnia- trazodone was increased  CPT CODE: 81856  Bobby Ragan Y Brissia Delisa, MD [facility] [phone]

## 2014-09-26 ENCOUNTER — Non-Acute Institutional Stay (SKILLED_NURSING_FACILITY): Payer: Medicare HMO | Admitting: Internal Medicine

## 2014-09-26 DIAGNOSIS — H109 Unspecified conjunctivitis: Secondary | ICD-10-CM

## 2014-09-28 NOTE — Progress Notes (Signed)
Patient ID: [name], female   DOB: 1951-04-26, 63 y.o.   MRN: [id number]           PROGRESS NOTE  DATE: 09/26/2014           FACILITY:  [facility]  LEVEL OF CARE: SNF (31)  Acute Visit  CHIEF COMPLAINT:  Manage bilateral eye redness and discharge.    HISTORY OF PRESENT ILLNESS: I was requested by the staff to assess the patient regarding above problem(s):  Staff report that patient has drainage and swelling in bilateral eyes, noted since yesterday.  Patient is complaining of some eye pain and visual disturbance.  She denies fever.  She cannot identify precipitating or alleviating factors.  There is no temporal relationship.    PAST MEDICAL HISTORY : Reviewed.  No changes/see problem list  CURRENT MEDICATIONS: Reviewed per MAR/see medication list  REVIEW OF SYSTEMS:  GENERAL: no change in appetite, no fatigue, no weight changes, no fever, chills or weakness RESPIRATORY: no cough, SOB, DOE,, wheezing, hemoptysis CARDIAC: no chest pain, edema or palpitations GI: no abdominal pain, diarrhea, constipation, heart burn, nausea or vomiting  PHYSICAL EXAMINATION  VS: see VS section  GENERAL: no acute distress, morbidly obese body habitus EYES: bilateral eyes have mild conjunctival erythema and yellow discharge      NECK: supple, trachea midline, no neck masses, no thyroid tenderness, no thyromegaly LYMPHATICS: no LAN in the neck, no supraclavicular LAN RESPIRATORY: breathing is even & unlabored, BS CTAB CARDIAC: RRR, no murmur,no extra heart sounds, +1 bilateral lower extremity edema      ASSESSMENT/PLAN:  Bilateral conjunctivitis.  New problem.  Start gentamicin ophthalmic 0.3%, 1 drop to each eye b.i.d. for one week.    CPT CODE: 85885          Gayani Y Dasanayaka, [place] [phone]

## 2014-12-14 ENCOUNTER — Ambulatory Visit (HOSPITAL_COMMUNITY)
Admission: RE | Admit: 2014-12-14 | Discharge: 2014-12-14 | Disposition: A | Discharge status: Home / Self Care | Payer: Medicare HMO | Source: Ambulatory Visit | Attending: Internal Medicine | Admitting: Internal Medicine

## 2014-12-14 VITALS — BP 138/56 | HR 51

## 2014-12-14 DIAGNOSIS — G4733 Obstructive sleep apnea (adult) (pediatric): Secondary | ICD-10-CM | POA: Insufficient documentation

## 2014-12-14 DIAGNOSIS — I480 Paroxysmal atrial fibrillation: Secondary | ICD-10-CM | POA: Insufficient documentation

## 2014-12-14 DIAGNOSIS — Z7409 Other reduced mobility: Secondary | ICD-10-CM

## 2014-12-14 DIAGNOSIS — I5032 Chronic diastolic (congestive) heart failure: Secondary | ICD-10-CM | POA: Diagnosis not present

## 2014-12-14 DIAGNOSIS — I4891 Unspecified atrial fibrillation: Secondary | ICD-10-CM

## 2014-12-14 DIAGNOSIS — M623 Immobility syndrome (paraplegic): Secondary | ICD-10-CM | POA: Diagnosis not present

## 2014-12-14 DIAGNOSIS — N189 Chronic kidney disease, unspecified: Secondary | ICD-10-CM | POA: Diagnosis not present

## 2014-12-14 NOTE — Patient Instructions (Signed)
Stop Amio  We will contact you in 6 months to schedule your next appointment.

## 2014-12-14 NOTE — Progress Notes (Signed)
Patient ID: [name], female   DOB: 05/09/1951, 64 y.o.   MRN: [id number]  PCP: Dr. Dellia Nims Pulmonologist: Dr Annamaria Boots  Resides at [facility]  [phone] with chronic diastolic HF, CKD, CVA, PAF and OHS/OSA on home oxygen. Not on anticoagulants due to GI bleed and severe hematoma. Last echo in 11/14 showed EF 50-55%.  She has had multiple CHF admissions in the past for diastolic CHF exacerbation. Most recently, in 2/15, she was admitted to [facility] with a diastolic CHF exacerbation.  She was diuresed and discharged 01/28/14 to a nursing home.  Discharge weight 254 pounds.   Follow up for Heart Failure:  She returns to follow up. Overall feels ok. Breathing ok. Not working with therapy at the present time. Weight 213 pounds at SNF with a lift. Wears 2 liters [place] continuously. Has been in a wheel chair for over 1 year due to CVA. Not on low salt diet.   Labs (2/15): K 3.9, creatinine 2.34, LFTs normal          (3/15): K 4.6, Cr 2.44          (4/15): BUN 79, creatinine 2.86 (from maple [place])          (05/2014): K 3.7 Creatinine 2.5 Pro BNP 523   PMH: 1. Diastolic CHF: Echo (11/91) with EF 50-55%, mild LVH, mild AI.  2. Morbid obesity 3. H/o left TKR 4. OSA/OHS: On oxygen for > 1 year.  5. LHC in 10/05 with nonobstructive disease.  6. Asthma 7. CKD 8. Hyperlipidemia 9. Atrial fibrillation: Paroxysmal.  She has not been on anticoagulation due to history of GI bleed and severe hematoma of right leg requiring surgical evaluation.  10. Type II diabetes 11. Gout  SH: Married, currently in a nursing home, nonsmoker. Currently at [facility].   FH: No premature CAD  ROS: All systems reviewed and negative except as per HPI.   Current Outpatient Prescriptions  Medication Sig Dispense Refill  . albuterol (PROVENTIL HFA;VENTOLIN HFA) 108 (90 BASE) MCG/ACT inhaler Inhale 2 puffs into the lungs every 6 (six) hours as needed for wheezing or shortness of breath.     [name] albuterol (PROVENTIL) (2.5 MG/3ML)  0.083% nebulizer solution Take 2.5 mg by nebulization every 6 (six) hours as needed for wheezing or shortness of breath.    . allopurinol (ZYLOPRIM) 100 MG tablet Take 100 mg by mouth daily. Patient takes at 0800am daily    . amLODipine (NORVASC) 10 MG tablet Take 10 mg by mouth daily. Patient takes at 0800am daily    . atorvastatin (LIPITOR) 20 MG tablet Take 20 mg by mouth at bedtime. Patient takes at 2100    . hydrALAZINE (APRESOLINE) 25 MG tablet Take 5 tablets (125 mg total) by mouth 3 (three) times daily. 90 tablet 0  . insulin glargine (LANTUS) 100 UNIT/ML injection Inject 15 Units into the skin 2 (two) times daily.    . insulin lispro (HUMALOG) 100 UNIT/ML injection 201-250= 2 units, 251-300= 4 units, 301-350=6 units, 351-400= 8 units, >400= 10 units and call MD    . isosorbide dinitrate (ISORDIL) 30 MG tablet Take 1 tablet (30 mg total) by mouth 2 (two) times daily.    . metoCLOPramide (REGLAN) 5 MG tablet Take 5 mg by mouth 3 (three) times daily.    . metoprolol tartrate (LOPRESSOR) 25 MG tablet Take 6.25 mg by mouth 2 (two) times daily.     [name] omeprazole (PRILOSEC) 20 MG capsule Take 20 mg by mouth  2 (two) times daily before a meal.     . polyethylene glycol (MIRALAX / GLYCOLAX) packet Take 17 g by mouth 2 (two) times daily.    [name] torsemide (DEMADEX) 20 MG tablet Take 40 mg by mouth daily.     . traZODone (DESYREL) 50 MG tablet Take 50 mg by mouth at bedtime.      No current facility-administered medications for this encounter.    Filed Vitals:   12/14/14 0921  BP: 138/56  Pulse: 51  SpO2: 96%    General: Obese, looks older than stated age. Sitting in wheelchair.  Neck: JVP difficult to assess d/t body habitus but appears 8  cm, no thyromegaly or thyroid nodule.  Lungs: Clear to auscultation bilaterally with normal respiratory effort. CV: Nondisplaced PMI.  Heart regular S1/S2, no S3/S4, 1/6 early SEM. No edema;  No carotid bruit.  Normal pedal pulses.  Abdomen: Soft, nontender,  no hepatosplenomegaly, mildly distented.  Skin: Intact without lesions or rashes.  Neurologic: Alert and oriented x 3.  Psych: Normal affect. Extremities: No clubbing or cyanosis. No edema. HEENT: Normal.    EKG: Sinus Bradycardia 51 bpm  Assessment/Plan:  1. Chronic diastolic CHF: EF 74-16% grade 1 DD (10/2013) - NYHA III symptoms and volume status appears stable. Very difficult to assess volume status d/t body habitus and patient not able to stand up for weights. Have asked SNF to daily.  Continue torsemide 40 gm BID.  - Reinforced the need and importance of daily weights, a low sodium diet, and fluid restriction (less than 2 L a day). Instructed to call the HF clinic if weight increases more than 3 lbs overnight or 5 lbs in a week.   Ask SNF to check BMET.  2. CKD: Last creatinine 2.8, stable. Baseline Cr 2.4-2.8. Asked SNF MD to refer back to nephrology.  3. Paroxysmal atrial fibrillation: EKG Sinus Loletha Grayer. 51 bpm.   She is not on coumadin with history of GI bleeding as well as a severe right leg hematoma, both while on coumadin.  Stop amiodarone today. Continue low dose bb. . 4. OHS/OSA: On 2 lters [place]. Is supposed to wear CPAP but does not wear nightly. Continue to follow with Dr. Annamaria Boots. 5. Immobility- Needs PT.   Follow up 6 months    Kiersten Coss NP-C  12/14/2014

## 2015-02-15 ENCOUNTER — Ambulatory Visit (HOSPITAL_COMMUNITY)
Admission: RE | Admit: 2015-02-15 | Discharge: 2015-02-15 | Disposition: A | Discharge status: Home / Self Care | Payer: Medicare HMO | Source: Ambulatory Visit | Attending: Internal Medicine | Admitting: Internal Medicine

## 2015-02-15 DIAGNOSIS — N184 Chronic kidney disease, stage 4 (severe): Secondary | ICD-10-CM

## 2015-02-15 DIAGNOSIS — I5020 Unspecified systolic (congestive) heart failure: Secondary | ICD-10-CM | POA: Insufficient documentation

## 2015-02-15 DIAGNOSIS — E119 Type 2 diabetes mellitus without complications: Secondary | ICD-10-CM

## 2015-02-16 ENCOUNTER — Other Ambulatory Visit (HOSPITAL_COMMUNITY): Payer: Self-pay | Admitting: Internal Medicine

## 2015-02-16 ENCOUNTER — Ambulatory Visit (HOSPITAL_COMMUNITY)
Admission: RE | Admit: 2015-02-16 | Discharge: 2015-02-16 | Disposition: A | Discharge status: Home / Self Care | Payer: Medicare HMO | Source: Ambulatory Visit | Attending: Internal Medicine | Admitting: Internal Medicine

## 2015-02-16 ENCOUNTER — Encounter (HOSPITAL_COMMUNITY): Payer: Self-pay

## 2015-02-16 DIAGNOSIS — I5020 Unspecified systolic (congestive) heart failure: Secondary | ICD-10-CM | POA: Diagnosis not present

## 2015-02-16 LAB — PREPARE RBC (CROSSMATCH)

## 2015-02-16 MED ORDER — FUROSEMIDE 10 MG/ML IJ SOLN
20.0000 mg | Freq: Once | INTRAMUSCULAR | Status: AC
  Administered 2015-02-16: 20 mg via INTRAVENOUS
  Filled 2015-02-16: qty 2

## 2015-02-16 MED ORDER — SODIUM CHLORIDE 0.9 % IV SOLN
Freq: Once | INTRAVENOUS | Status: AC
  Administered 2015-02-16: 11:00:00 via INTRAVENOUS

## 2015-02-16 NOTE — Discharge Instructions (Signed)

## 2015-02-17 LAB — TYPE AND SCREEN
ABO/RH(D): A POS
ANTIBODY SCREEN: NEGATIVE
DONOR AG TYPE: NEGATIVE
Donor AG Type: NEGATIVE
Unit division: 0
Unit division: 0

## 2015-03-07 ENCOUNTER — Ambulatory Visit: Payer: Medicare HMO | Admitting: Internal Medicine

## 2015-04-27 ENCOUNTER — Emergency Department (HOSPITAL_COMMUNITY)
Admission: EM | Admit: 2015-04-27 | Discharge: 2015-04-27 | Disposition: A | Discharge status: Home / Self Care | Payer: Medicare Other | Attending: Emergency Medicine | Admitting: Emergency Medicine

## 2015-04-27 ENCOUNTER — Encounter (HOSPITAL_COMMUNITY): Payer: Self-pay | Admitting: *Deleted

## 2015-04-27 DIAGNOSIS — Z87442 Personal history of urinary calculi: Secondary | ICD-10-CM | POA: Insufficient documentation

## 2015-04-27 DIAGNOSIS — Z7901 Long term (current) use of anticoagulants: Secondary | ICD-10-CM | POA: Insufficient documentation

## 2015-04-27 DIAGNOSIS — E119 Type 2 diabetes mellitus without complications: Secondary | ICD-10-CM | POA: Diagnosis not present

## 2015-04-27 DIAGNOSIS — G4733 Obstructive sleep apnea (adult) (pediatric): Secondary | ICD-10-CM | POA: Insufficient documentation

## 2015-04-27 DIAGNOSIS — Z79899 Other long term (current) drug therapy: Secondary | ICD-10-CM | POA: Insufficient documentation

## 2015-04-27 DIAGNOSIS — Z880 Allergy status to penicillin: Secondary | ICD-10-CM | POA: Diagnosis not present

## 2015-04-27 DIAGNOSIS — Z8673 Personal history of transient ischemic attack (TIA), and cerebral infarction without residual deficits: Secondary | ICD-10-CM | POA: Insufficient documentation

## 2015-04-27 DIAGNOSIS — R7989 Other specified abnormal findings of blood chemistry: Secondary | ICD-10-CM | POA: Diagnosis present

## 2015-04-27 DIAGNOSIS — J45909 Unspecified asthma, uncomplicated: Secondary | ICD-10-CM | POA: Diagnosis not present

## 2015-04-27 DIAGNOSIS — N189 Chronic kidney disease, unspecified: Secondary | ICD-10-CM

## 2015-04-27 DIAGNOSIS — N184 Chronic kidney disease, stage 4 (severe): Secondary | ICD-10-CM | POA: Insufficient documentation

## 2015-04-27 DIAGNOSIS — R609 Edema, unspecified: Secondary | ICD-10-CM | POA: Diagnosis not present

## 2015-04-27 DIAGNOSIS — Z87891 Personal history of nicotine dependence: Secondary | ICD-10-CM | POA: Diagnosis not present

## 2015-04-27 DIAGNOSIS — Z9981 Dependence on supplemental oxygen: Secondary | ICD-10-CM | POA: Insufficient documentation

## 2015-04-27 DIAGNOSIS — D649 Anemia, unspecified: Secondary | ICD-10-CM | POA: Insufficient documentation

## 2015-04-27 DIAGNOSIS — E785 Hyperlipidemia, unspecified: Secondary | ICD-10-CM | POA: Insufficient documentation

## 2015-04-27 DIAGNOSIS — M109 Gout, unspecified: Secondary | ICD-10-CM | POA: Diagnosis not present

## 2015-04-27 DIAGNOSIS — K219 Gastro-esophageal reflux disease without esophagitis: Secondary | ICD-10-CM | POA: Diagnosis not present

## 2015-04-27 DIAGNOSIS — Z91040 Latex allergy status: Secondary | ICD-10-CM | POA: Diagnosis not present

## 2015-04-27 DIAGNOSIS — Z8659 Personal history of other mental and behavioral disorders: Secondary | ICD-10-CM | POA: Insufficient documentation

## 2015-04-27 DIAGNOSIS — Z872 Personal history of diseases of the skin and subcutaneous tissue: Secondary | ICD-10-CM | POA: Diagnosis not present

## 2015-04-27 DIAGNOSIS — Z8701 Personal history of pneumonia (recurrent): Secondary | ICD-10-CM | POA: Diagnosis not present

## 2015-04-27 DIAGNOSIS — Z794 Long term (current) use of insulin: Secondary | ICD-10-CM | POA: Diagnosis not present

## 2015-04-27 DIAGNOSIS — I129 Hypertensive chronic kidney disease with stage 1 through stage 4 chronic kidney disease, or unspecified chronic kidney disease: Secondary | ICD-10-CM | POA: Insufficient documentation

## 2015-04-27 DIAGNOSIS — Z8619 Personal history of other infectious and parasitic diseases: Secondary | ICD-10-CM | POA: Diagnosis not present

## 2015-04-27 LAB — BASIC METABOLIC PANEL
ANION GAP: 10 (ref 5–15)
BUN: 50 mg/dL — ABNORMAL HIGH (ref 6–20)
CALCIUM: 8.5 mg/dL — AB (ref 8.9–10.3)
CO2: 30 mmol/L (ref 22–32)
CREATININE: 2 mg/dL — AB (ref 0.44–1.00)
Chloride: 102 mmol/L (ref 101–111)
GFR calc non Af Amer: 25 mL/min — ABNORMAL LOW (ref >60)
GFR, EST AFRICAN AMERICAN: 29 mL/min — AB (ref >60)
Glucose, Bld: 76 mg/dL (ref 65–99)
Potassium: 4.1 mmol/L (ref 3.5–5.1)
Sodium: 142 mmol/L (ref 135–145)

## 2015-04-27 LAB — CBC WITH DIFFERENTIAL/PLATELET
BASOS ABS: 0 10*3/uL (ref 0.0–0.1)
BASOS PCT: 1 % (ref 0–1)
EOS ABS: 0.1 10*3/uL (ref 0.0–0.7)
Eosinophils Relative: 2 % (ref 0–5)
HCT: 22.8 % — ABNORMAL LOW (ref 36.0–46.0)
HEMOGLOBIN: 7 g/dL — AB (ref 12.0–15.0)
LYMPHS ABS: 0.8 10*3/uL (ref 0.7–4.0)
Lymphocytes Relative: 12 % (ref 12–46)
MCH: 27.8 pg (ref 26.0–34.0)
MCHC: 30.7 g/dL (ref 30.0–36.0)
MCV: 90.5 fL (ref 78.0–100.0)
Monocytes Absolute: 0.3 10*3/uL (ref 0.1–1.0)
Monocytes Relative: 4 % (ref 3–12)
NEUTROS PCT: 81 % — AB (ref 43–77)
Neutro Abs: 5 10*3/uL (ref 1.7–7.7)
PLATELETS: 228 10*3/uL (ref 150–400)
RBC: 2.52 MIL/uL — AB (ref 3.87–5.11)
RDW: 15.5 % (ref 11.5–15.5)
WBC: 6.2 10*3/uL (ref 4.0–10.5)

## 2015-04-27 NOTE — ED Notes (Signed)
[facility] called to get pt

## 2015-04-27 NOTE — ED Notes (Addendum)
Pt arrives via [facility] from [facility]. Pt had labs drawn today that shows at hgb of 6.1(which was a 0.4 drop from labs on 04/13/14) and hct of 21. Nursing facility sent pt here for a blood transfusion. Pt denies any sx and states she feels like she normally does with no changes.

## 2015-04-27 NOTE — ED Notes (Signed)
[facility] called re: eta. PTAR states they didn't page out call until 1631. [facility] was first called for pt at 1532.

## 2015-04-27 NOTE — ED Provider Notes (Signed)
CSN: [id number]     Arrival date & time 04/27/15  1152 History   First MD Initiated Contact with Patient 04/27/15 1251     Chief Complaint  Patient presents with  . Labs Only     (Consider location/radiation/quality/duration/timing/severity/associated sxs/prior Treatment) HPI [name] is a 64 y.o. female with multiple medical problems, currently at [facility] facility, sent here for hemoglobin of 6.1. Patient denies any complaints. Paperwork with patient shows trending hemoglobin over the last several months, with 6.5 being her baseline. No prior hemoglobin in her record for the last year. Patient denies any bloody stools. She denies any dizziness or weakness. She is nonambulatory at baseline.  Past Medical History  Diagnosis Date  . Asthma   . Bronchitis   . Allergic rhinitis   . HTN (hypertension)   . Hyperlipidemia   . Obesity   . OSA (obstructive sleep apnea)     poor compliance with cpap  . CHF (congestive heart failure)   . Biventricular failure     compensated  . Psoriasis   . Stasis edema     of lower extremities  . Depression   . Situational stress   . Anemia   . Gout   . Chronic anticoagulation     off due to GIB, thigh hematoma  . Yeast infection involving the vagina and surrounding area   . Atrial fibrillation with RVR   . GERD (gastroesophageal reflux disease)   . Chronic bronchitis     "get it q yr" (08/10/2013)  . Pneumonia     "said I did on 08/03/2013 then they ruled it out" (08/10/2013)  . Shortness of breath     "all the time" (08/10/2013)  . On home oxygen therapy     "2L during the night and prn during the day" (08/10/2013)  . Type II diabetes mellitus   . History of blood transfusion     "few during my lifetime; last time was 4 days ago when I got 2 units" (08/10/2013)  . Stroke ?2007    denies residuals on 08/10/2013  . DJD (degenerative joint disease)   . Bleeding     "from my skin folds; just won't stop" (08/10/2013)  . Complication of  anesthesia     "they gave me too much; I stayed out of it for awhile" (08/10/2013)  . Anxiety   . Kidney stones   . Kidney disease, chronic, stage III (GFR 30-59 ml/min)   . Chronic kidney disease, stage IV (severe)   . Morbid obesity    Past Surgical History  Procedure Laterality Date  . Appendectomy    . Cholecystectomy    . Total knee arthroplasty Left ~ 2006  . Total abdominal hysterectomy    . Back surgery    . Cystectomy Left     hand  . Tonsillectomy    . Joint replacement    . Lumbar disc surgery    . Colonoscopy N/A 08/16/2013    Procedure: COLONOSCOPY;  Surgeon: Beryle Beams, MD;  Location: [place];  Service: Endoscopy;  Laterality: N/A;  . Hematoma evacuation Right 09/10/2013    Procedure: EVACUATION OF RIGHT LEG HEMATOMA AND EXCISION [place] RIGHT LEG ;  Surgeon: Merrie Roof, MD;  Location: [facility];  Service: General;  Laterality: Right;  . Debridement and closure wound Right 09/22/2013    Procedure: IRRIGATION AND DEBRIDEMENT SURGICAL PREP PLACEMENT OF [place]  ;  Surgeon: Irene Limbo, MD;  Location: [facility];  Service: Plastics;  Laterality: Right;  . Skin split graft Right 10/07/2013    Procedure: SPLIT THICKNESS SKIN GRAFT RIGHT THIGH TO RIGHT LEG, PLACEMENT OF A CEL AND WOUND VAC TO RIGHT THIGH  ;  Surgeon: Irene Limbo, MD;  Location: [facility];  Service: Plastics;  Laterality: Right;   Family History  Problem Relation Age of Onset  . Heart attack Father   . Asthma Father   . Heart disease Paternal Uncle   . Rectal cancer Paternal Aunt   . Other Mother     mva   History  Substance Use Topics  . Smoking status: Former Smoker -- 0.12 packs/day for 12 years    Types: Cigarettes    Quit date: 07/05/1982  . Smokeless tobacco: Former Systems developer     Comment: smoked for 1.5 yrs about 2 cigs a day ,only if stressed  . Alcohol Use: No   OB History    No data available     Review of Systems  Constitutional: Negative for fever and chills.   Respiratory: Negative for cough, chest tightness and shortness of breath.   Cardiovascular: Negative for chest pain, palpitations and leg swelling.  Gastrointestinal: Negative for nausea, vomiting, abdominal pain, diarrhea and blood in stool.  Genitourinary: Negative for dysuria and flank pain.  Musculoskeletal: Negative for myalgias, arthralgias, neck pain and neck stiffness.  Skin: Negative for rash.  Neurological: Negative for dizziness, weakness and headaches.  All other systems reviewed and are negative.     Allergies  Daptomycin; Morphine and related; Cephalexin; Tramadol; Atenolol; Erythromycin; Celecoxib; Codeine; Fluoxetine hcl; Latex; Ofloxacin; Penicillins; Rofecoxib; and Sulfonamide derivatives  Home Medications   Prior to Admission medications   Medication Sig Start Date End Date Taking? Authorizing Provider  albuterol (PROVENTIL HFA;VENTOLIN HFA) 108 (90 BASE) MCG/ACT inhaler Inhale 2 puffs into the lungs every 6 (six) hours as needed for wheezing or shortness of breath.     Historical Provider, MD  albuterol (PROVENTIL) (2.5 MG/3ML) 0.083% nebulizer solution Take 2.5 mg by nebulization every 6 (six) hours as needed for wheezing or shortness of breath.    Historical Provider, MD  allopurinol (ZYLOPRIM) 100 MG tablet Take 100 mg by mouth daily. Patient takes at 0800am daily 09/16/11   Historical Provider, MD  amLODipine (NORVASC) 10 MG tablet Take 10 mg by mouth daily. Patient takes at 0800am daily 08/19/13   Thurnell Lose, MD  atorvastatin (LIPITOR) 20 MG tablet Take 20 mg by mouth at bedtime. Patient takes at 2100    Historical Provider, MD  hydrALAZINE (APRESOLINE) 25 MG tablet Take 5 tablets (125 mg total) by mouth 3 (three) times daily. 12/15/13   Nita Sells, MD  insulin glargine (LANTUS) 100 UNIT/ML injection Inject 15 Units into the skin 2 (two) times daily.    Historical Provider, MD  insulin lispro (HUMALOG) 100 UNIT/ML injection 201-250= 2 units, 251-300= 4  units, 301-350=6 units, 351-400= 8 units, >400= 10 units and call MD    Historical Provider, MD  isosorbide dinitrate (ISORDIL) 30 MG tablet Take 1 tablet (30 mg total) by mouth 2 (two) times daily. 01/28/14   Charlynne Cousins, MD  metoCLOPramide (REGLAN) 5 MG tablet Take 5 mg by mouth 3 (three) times daily.    Historical Provider, MD  metoprolol tartrate (LOPRESSOR) 25 MG tablet Take 6.25 mg by mouth 2 (two) times daily.     Historical Provider, MD  omeprazole (PRILOSEC) 20 MG capsule Take 20 mg by mouth 2 (two) times daily  before a meal.     Historical Provider, MD  polyethylene glycol (MIRALAX / GLYCOLAX) packet Take 17 g by mouth 2 (two) times daily.    Historical Provider, MD  torsemide (DEMADEX) 20 MG tablet Take 40 mg by mouth daily.     Historical Provider, MD  traZODone (DESYREL) 50 MG tablet Take 50 mg by mouth at bedtime.     Historical Provider, MD   BP 132/49 mmHg  Pulse 61  Temp(Src) 97.8 F (36.6 C) (Oral)  Resp 14  Ht 5\' 3"  (1.6 m)  Wt 223 lb (101.152 kg)  BMI 39.51 kg/m2  SpO2 100% Physical Exam  Constitutional: She is oriented to person, place, and time. She appears well-developed and well-nourished. No distress.  Eyes: Conjunctivae are normal.  Neck: Neck supple.  Cardiovascular: Normal rate, regular rhythm and normal heart sounds.   Pulmonary/Chest: Effort normal and breath sounds normal. No respiratory distress. She has no wheezes. She has no rales.  Abdominal: Soft. Bowel sounds are normal. She exhibits no distension. There is no tenderness. There is no rebound.  Musculoskeletal: She exhibits edema.  Neurological: She is alert and oriented to person, place, and time.  Skin: Skin is warm and dry.  Nursing note and vitals reviewed.   ED Course  Procedures (including critical care time) Labs Review Labs Reviewed  CBC WITH DIFFERENTIAL/PLATELET - Abnormal; Notable for the following:    RBC 2.52 (*)    Hemoglobin 7.0 (*)    HCT 22.8 (*)    Neutrophils  Relative % 81 (*)    All other components within normal limits  BASIC METABOLIC PANEL - Abnormal; Notable for the following:    BUN 50 (*)    Creatinine, Ser 2.00 (*)    Calcium 8.5 (*)    GFR calc non Af Amer 25 (*)    GFR calc Af Amer 29 (*)    All other components within normal limits  TYPE AND SCREEN    Imaging Review No results found.   EKG Interpretation   Date/Time:  Thursday Apr 27 2015 11:59:46 EDT Ventricular Rate:  60 PR Interval:  172 QRS Duration: 65 QT Interval:  453 QTC Calculation: 453 R Axis:   57 Text Interpretation:  Sinus rhythm Low voltage, precordial leads  Borderline T wave abnormalities Baseline wander in lead(s) V1 Confirmed by  [place] ([id number]) on 04/27/2015 12:15:09 PM      MDM   Final diagnoses:  Chronic anemia  Chronic renal insufficiency, unspecified stage     1:48 PM Patient sent here from [facility] facility for hemoglobin of 6.1. Paperwork that was sent with her shows hemoglobin of 6.5 on 04/14/2015, and hemoglobin of 6.5 on 02/16/2015. This appears to be a chronic issue. I attempted to call the facility to talk to Dr. polite whose name was on the paperwork or to the patient's nurse, however they were unable to get me Dr. polite and the nurse that was taking care of was at lunch. I called Dr. Lina Sar office directly, and was on hold for 5-10 minutes, spoke with secretary they will try to get him on the phone for me.  3:25 PM Patient's hemoglobin here is 7. All of her labs appeared to be at baseline. I still have not heard from Dr. Librarian, academic. I'm not sure what we needed to do with this patient at this time. It appears that no emergent treatment is indicated at this time. Her vital signs are normal. We will discharge  her back to her facility.  Filed Vitals:   04/27/15 1300 04/27/15 1315 04/27/15 1330 04/27/15 1400  BP: 131/44 144/43 132/49 128/37  Pulse: 62 64 61 64  Temp:      TempSrc:      Resp: 13 15 14 14   Height:       Weight:      SpO2: 100% 100% 100% 100%     Jeannett Senior, PA-C 04/27/15 1634  Ernestina Patches, MD 04/27/15 2047

## 2015-04-27 NOTE — Discharge Instructions (Signed)
[name] hemoglobin here is 7. We did not transfuse her. Please follow-up with primary care doctor as needed.

## 2015-04-28 ENCOUNTER — Telehealth (HOSPITAL_BASED_OUTPATIENT_CLINIC_OR_DEPARTMENT_OTHER): Payer: Self-pay | Admitting: Emergency Medicine

## 2015-04-28 LAB — TYPE AND SCREEN
ABO/RH(D): A POS
Antibody Screen: POSITIVE
DAT, IGG: NEGATIVE
UNIT DIVISION: 0
Unit division: 0

## 2015-04-28 NOTE — Telephone Encounter (Signed)
NP @ [place] calling to see what treatment [name] received in ED, informed him that Hgb here was 7.0 and that pt was "asymtpomatic" and was not transfused according to [facility]

## 2015-05-17 ENCOUNTER — Other Ambulatory Visit (HOSPITAL_COMMUNITY): Payer: Self-pay | Admitting: Orthopedic Surgery

## 2015-05-17 ENCOUNTER — Ambulatory Visit (HOSPITAL_COMMUNITY)
Admission: RE | Admit: 2015-05-17 | Discharge: 2015-05-17 | Disposition: A | Discharge status: Home / Self Care | Payer: Medicare Other | Source: Ambulatory Visit | Attending: Cardiovascular Disease | Admitting: Cardiovascular Disease

## 2015-05-17 DIAGNOSIS — T8454XA Infection and inflammatory reaction due to internal left knee prosthesis, initial encounter: Secondary | ICD-10-CM

## 2015-05-17 DIAGNOSIS — Y838 Other surgical procedures as the cause of abnormal reaction of the patient, or of later complication, without mention of misadventure at the time of the procedure: Secondary | ICD-10-CM | POA: Diagnosis not present

## 2015-06-01 ENCOUNTER — Telehealth (HOSPITAL_COMMUNITY): Payer: Self-pay | Admitting: *Deleted

## 2016-01-03 ENCOUNTER — Emergency Department (HOSPITAL_COMMUNITY): Payer: Medicare Other

## 2016-01-03 ENCOUNTER — Encounter (HOSPITAL_COMMUNITY): Payer: Self-pay | Admitting: Emergency Medicine

## 2016-01-03 ENCOUNTER — Inpatient Hospital Stay (HOSPITAL_COMMUNITY)
Admission: EM | Admit: 2016-01-03 | Discharge: 2016-01-06 | DRG: 100 | Disposition: A | Discharge status: Skilled Nursing Facility | Payer: Medicare Other | Attending: Internal Medicine | Admitting: Internal Medicine

## 2016-01-03 ENCOUNTER — Inpatient Hospital Stay (HOSPITAL_COMMUNITY): Payer: Medicare Other

## 2016-01-03 DIAGNOSIS — E1122 Type 2 diabetes mellitus with diabetic chronic kidney disease: Secondary | ICD-10-CM | POA: Diagnosis present

## 2016-01-03 DIAGNOSIS — Y95 Nosocomial condition: Secondary | ICD-10-CM | POA: Diagnosis present

## 2016-01-03 DIAGNOSIS — Z7189 Other specified counseling: Secondary | ICD-10-CM | POA: Insufficient documentation

## 2016-01-03 DIAGNOSIS — G40409 Other generalized epilepsy and epileptic syndromes, not intractable, without status epilepticus: Principal | ICD-10-CM | POA: Diagnosis present

## 2016-01-03 DIAGNOSIS — J690 Pneumonitis due to inhalation of food and vomit: Secondary | ICD-10-CM

## 2016-01-03 DIAGNOSIS — G9340 Encephalopathy, unspecified: Secondary | ICD-10-CM | POA: Diagnosis present

## 2016-01-03 DIAGNOSIS — Z7401 Bed confinement status: Secondary | ICD-10-CM | POA: Diagnosis not present

## 2016-01-03 DIAGNOSIS — I4891 Unspecified atrial fibrillation: Secondary | ICD-10-CM | POA: Diagnosis present

## 2016-01-03 DIAGNOSIS — Z515 Encounter for palliative care: Secondary | ICD-10-CM | POA: Diagnosis present

## 2016-01-03 DIAGNOSIS — I5032 Chronic diastolic (congestive) heart failure: Secondary | ICD-10-CM | POA: Diagnosis present

## 2016-01-03 DIAGNOSIS — Z87891 Personal history of nicotine dependence: Secondary | ICD-10-CM | POA: Diagnosis not present

## 2016-01-03 DIAGNOSIS — Z66 Do not resuscitate: Secondary | ICD-10-CM | POA: Diagnosis present

## 2016-01-03 DIAGNOSIS — N179 Acute kidney failure, unspecified: Secondary | ICD-10-CM | POA: Diagnosis present

## 2016-01-03 DIAGNOSIS — I10 Essential (primary) hypertension: Secondary | ICD-10-CM

## 2016-01-03 DIAGNOSIS — I480 Paroxysmal atrial fibrillation: Secondary | ICD-10-CM

## 2016-01-03 DIAGNOSIS — N184 Chronic kidney disease, stage 4 (severe): Secondary | ICD-10-CM | POA: Diagnosis present

## 2016-01-03 DIAGNOSIS — K219 Gastro-esophageal reflux disease without esophagitis: Secondary | ICD-10-CM | POA: Diagnosis present

## 2016-01-03 DIAGNOSIS — E870 Hyperosmolality and hypernatremia: Secondary | ICD-10-CM | POA: Diagnosis present

## 2016-01-03 DIAGNOSIS — Z9981 Dependence on supplemental oxygen: Secondary | ICD-10-CM | POA: Diagnosis not present

## 2016-01-03 DIAGNOSIS — J189 Pneumonia, unspecified organism: Secondary | ICD-10-CM

## 2016-01-03 DIAGNOSIS — R739 Hyperglycemia, unspecified: Secondary | ICD-10-CM

## 2016-01-03 DIAGNOSIS — E860 Dehydration: Secondary | ICD-10-CM | POA: Diagnosis present

## 2016-01-03 DIAGNOSIS — E1165 Type 2 diabetes mellitus with hyperglycemia: Secondary | ICD-10-CM | POA: Diagnosis present

## 2016-01-03 DIAGNOSIS — E785 Hyperlipidemia, unspecified: Secondary | ICD-10-CM | POA: Diagnosis present

## 2016-01-03 DIAGNOSIS — R569 Unspecified convulsions: Secondary | ICD-10-CM | POA: Diagnosis not present

## 2016-01-03 DIAGNOSIS — N189 Chronic kidney disease, unspecified: Secondary | ICD-10-CM

## 2016-01-03 DIAGNOSIS — I130 Hypertensive heart and chronic kidney disease with heart failure and stage 1 through stage 4 chronic kidney disease, or unspecified chronic kidney disease: Secondary | ICD-10-CM | POA: Diagnosis present

## 2016-01-03 HISTORY — DX: Unspecified diastolic (congestive) heart failure: I50.30

## 2016-01-03 HISTORY — DX: Pneumonia, unspecified organism: J18.9

## 2016-01-03 LAB — I-STAT CHEM 8, ED
BUN: 102 mg/dL — ABNORMAL HIGH (ref 6–20)
Calcium, Ion: 1.07 mmol/L — ABNORMAL LOW (ref 1.13–1.30)
Chloride: 109 mmol/L (ref 101–111)
Creatinine, Ser: 1.6 mg/dL — ABNORMAL HIGH (ref 0.44–1.00)
GLUCOSE: 220 mg/dL — AB (ref 65–99)
HEMATOCRIT: 40 % (ref 36.0–46.0)
HEMOGLOBIN: 13.6 g/dL (ref 12.0–15.0)
Potassium: 3.3 mmol/L — ABNORMAL LOW (ref 3.5–5.1)
Sodium: 147 mmol/L — ABNORMAL HIGH (ref 135–145)
TCO2: 20 mmol/L (ref 0–100)

## 2016-01-03 LAB — STREP PNEUMONIAE URINARY ANTIGEN: Strep Pneumo Urinary Antigen: NEGATIVE

## 2016-01-03 LAB — COMPREHENSIVE METABOLIC PANEL
ALBUMIN: 2.6 g/dL — AB (ref 3.5–5.0)
ALT: 12 U/L — ABNORMAL LOW (ref 14–54)
AST: 27 U/L (ref 15–41)
Alkaline Phosphatase: 108 U/L (ref 38–126)
Anion gap: 16 — ABNORMAL HIGH (ref 5–15)
BUN: 99 mg/dL — ABNORMAL HIGH (ref 6–20)
CO2: 20 mmol/L — AB (ref 22–32)
Calcium: 8.5 mg/dL — ABNORMAL LOW (ref 8.9–10.3)
Chloride: 111 mmol/L (ref 101–111)
Creatinine, Ser: 1.94 mg/dL — ABNORMAL HIGH (ref 0.44–1.00)
GFR calc Af Amer: 30 mL/min — ABNORMAL LOW (ref >60)
GFR calc non Af Amer: 26 mL/min — ABNORMAL LOW (ref >60)
GLUCOSE: 228 mg/dL — AB (ref 65–99)
POTASSIUM: 3.3 mmol/L — AB (ref 3.5–5.1)
Sodium: 147 mmol/L — ABNORMAL HIGH (ref 135–145)
Total Bilirubin: 0.2 mg/dL — ABNORMAL LOW (ref 0.3–1.2)
Total Protein: 6.4 g/dL — ABNORMAL LOW (ref 6.5–8.1)

## 2016-01-03 LAB — CBC WITH DIFFERENTIAL/PLATELET
BASOS ABS: 0 10*3/uL (ref 0.0–0.1)
BASOS PCT: 0 %
EOS PCT: 0 %
Eosinophils Absolute: 0 10*3/uL (ref 0.0–0.7)
HCT: 35.4 % — ABNORMAL LOW (ref 36.0–46.0)
Hemoglobin: 11.1 g/dL — ABNORMAL LOW (ref 12.0–15.0)
Lymphocytes Relative: 8 %
Lymphs Abs: 1.3 10*3/uL (ref 0.7–4.0)
MCH: 30.8 pg (ref 26.0–34.0)
MCHC: 31.4 g/dL (ref 30.0–36.0)
MCV: 98.3 fL (ref 78.0–100.0)
Monocytes Absolute: 0.4 10*3/uL (ref 0.1–1.0)
Monocytes Relative: 2 %
NEUTROS ABS: 13.8 10*3/uL — AB (ref 1.7–7.7)
Neutrophils Relative %: 90 %
Platelets: 256 10*3/uL (ref 150–400)
RBC: 3.6 MIL/uL — ABNORMAL LOW (ref 3.87–5.11)
RDW: 14.6 % (ref 11.5–15.5)
WBC: 15.5 10*3/uL — ABNORMAL HIGH (ref 4.0–10.5)

## 2016-01-03 LAB — LACTIC ACID, PLASMA
Lactic Acid, Venous: 1.6 mmol/L (ref 0.5–2.0)
Lactic Acid, Venous: 3.7 mmol/L (ref 0.5–2.0)

## 2016-01-03 LAB — PHOSPHORUS: PHOSPHORUS: 2.5 mg/dL (ref 2.5–4.6)

## 2016-01-03 LAB — I-STAT ARTERIAL BLOOD GAS, ED
BICARBONATE: 24.5 meq/L — AB (ref 20.0–24.0)
O2 Saturation: 94 %
TCO2: 26 mmol/L (ref 0–100)
pCO2 arterial: 39.8 mmHg (ref 35.0–45.0)
pH, Arterial: 7.397 (ref 7.350–7.450)
pO2, Arterial: 71 mmHg — ABNORMAL LOW (ref 80.0–100.0)

## 2016-01-03 LAB — URINALYSIS, ROUTINE W REFLEX MICROSCOPIC
Bilirubin Urine: NEGATIVE
Glucose, UA: NEGATIVE mg/dL
HGB URINE DIPSTICK: NEGATIVE
Ketones, ur: NEGATIVE mg/dL
Nitrite: NEGATIVE
Protein, ur: 100 mg/dL — AB
SPECIFIC GRAVITY, URINE: 1.017 (ref 1.005–1.030)
pH: 5 (ref 5.0–8.0)

## 2016-01-03 LAB — MRSA PCR SCREENING: MRSA by PCR: NEGATIVE

## 2016-01-03 LAB — CK: Total CK: 40 U/L (ref 38–234)

## 2016-01-03 LAB — MAGNESIUM: MAGNESIUM: 1.7 mg/dL (ref 1.7–2.4)

## 2016-01-03 LAB — CBG MONITORING, ED
GLUCOSE-CAPILLARY: 181 mg/dL — AB (ref 65–99)
Glucose-Capillary: 100 mg/dL — ABNORMAL HIGH (ref 65–99)

## 2016-01-03 LAB — I-STAT CG4 LACTIC ACID, ED
LACTIC ACID, VENOUS: 7.19 mmol/L — AB (ref 0.5–2.0)
LACTIC ACID, VENOUS: 1.86 mmol/L (ref 0.5–2.0)

## 2016-01-03 LAB — URINE MICROSCOPIC-ADD ON
BACTERIA UA: NONE SEEN
RBC / HPF: NONE SEEN RBC/hpf (ref 0–5)

## 2016-01-03 MED ORDER — DILTIAZEM HCL 100 MG IV SOLR: 5.0000 mg/h | INTRAVENOUS | Status: DC

## 2016-01-03 MED ORDER — LORAZEPAM 2 MG/ML IJ SOLN
2.0000 mg | INTRAMUSCULAR | Status: DC | PRN
  Administered 2016-01-03: 2 mg via INTRAVENOUS
  Filled 2016-01-03: qty 1

## 2016-01-03 MED ORDER — DILTIAZEM LOAD VIA INFUSION
10.0000 mg | Freq: Once | INTRAVENOUS | Status: DC
  Filled 2016-01-03: qty 10

## 2016-01-03 MED ORDER — SODIUM CHLORIDE 0.9 % IV BOLUS (SEPSIS)
1000.0000 mL | Freq: Once | INTRAVENOUS | Status: AC
  Administered 2016-01-03: 1000 mL via INTRAVENOUS

## 2016-01-03 MED ORDER — PANTOPRAZOLE SODIUM 40 MG IV SOLR
40.0000 mg | INTRAVENOUS | Status: DC
  Administered 2016-01-03 – 2016-01-04 (×2): 40 mg via INTRAVENOUS
  Filled 2016-01-03 (×2): qty 40

## 2016-01-03 MED ORDER — SODIUM CHLORIDE 0.9% FLUSH
3.0000 mL | Freq: Two times a day (BID) | INTRAVENOUS | Status: DC
Start: 2016-01-03 — End: 2016-01-06
  Administered 2016-01-03 – 2016-01-06 (×6): 3 mL via INTRAVENOUS

## 2016-01-03 MED ORDER — LEVOFLOXACIN IN D5W 750 MG/150ML IV SOLN
750.0000 mg | Freq: Once | INTRAVENOUS | Status: AC
  Administered 2016-01-03: 750 mg via INTRAVENOUS
  Filled 2016-01-03: qty 150

## 2016-01-03 MED ORDER — SODIUM CHLORIDE 0.9 % IV SOLN
250.0000 mg | Freq: Four times a day (QID) | INTRAVENOUS | Status: DC
  Administered 2016-01-03 – 2016-01-04 (×3): 250 mg via INTRAVENOUS
  Filled 2016-01-03 (×6): qty 250

## 2016-01-03 MED ORDER — SODIUM CHLORIDE 0.9 % IV BOLUS (SEPSIS): 1000.0000 mL | Freq: Once | INTRAVENOUS | Status: DC

## 2016-01-03 MED ORDER — LORAZEPAM 2 MG/ML IJ SOLN
INTRAMUSCULAR | Status: AC
  Administered 2016-01-03: 20:00:00
  Filled 2016-01-03: qty 1

## 2016-01-03 MED ORDER — LORAZEPAM 2 MG/ML IJ SOLN
2.0000 mg | INTRAMUSCULAR | Status: AC
  Administered 2016-01-03: 2 mg via INTRAVENOUS

## 2016-01-03 MED ORDER — VANCOMYCIN HCL IN DEXTROSE 1-5 GM/200ML-% IV SOLN: 1000.0000 mg | Freq: Once | INTRAVENOUS | Status: DC

## 2016-01-03 MED ORDER — ONDANSETRON HCL 4 MG/2ML IJ SOLN
4.0000 mg | Freq: Four times a day (QID) | INTRAMUSCULAR | Status: DC | PRN
  Administered 2016-01-03: 4 mg via INTRAVENOUS
  Filled 2016-01-03: qty 2

## 2016-01-03 MED ORDER — DEXTROSE 5 % IV SOLN
1.0000 g | Freq: Three times a day (TID) | INTRAVENOUS | Status: DC
  Filled 2016-01-03: qty 1

## 2016-01-03 MED ORDER — DEXTROSE 5 % IV SOLN
2.0000 g | Freq: Once | INTRAVENOUS | Status: AC
  Administered 2016-01-03: 2 g via INTRAVENOUS
  Filled 2016-01-03: qty 2

## 2016-01-03 MED ORDER — LABETALOL HCL 5 MG/ML IV SOLN
5.0000 mg | INTRAVENOUS | Status: DC | PRN
  Administered 2016-01-03: 5 mg via INTRAVENOUS
  Filled 2016-01-03 (×3): qty 4

## 2016-01-03 MED ORDER — HYDRALAZINE HCL 20 MG/ML IJ SOLN: 5.0000 mg | INTRAMUSCULAR | Status: DC | PRN

## 2016-01-03 MED ORDER — LEVOFLOXACIN IN D5W 750 MG/150ML IV SOLN: 750.0000 mg | INTRAVENOUS | Status: DC

## 2016-01-03 MED ORDER — LEVALBUTEROL HCL 0.63 MG/3ML IN NEBU
0.6300 mg | INHALATION_SOLUTION | Freq: Once | RESPIRATORY_TRACT | Status: AC
  Administered 2016-01-03: 0.63 mg via RESPIRATORY_TRACT
  Filled 2016-01-03: qty 3

## 2016-01-03 MED ORDER — ONDANSETRON HCL 4 MG PO TABS
4.0000 mg | ORAL_TABLET | Freq: Four times a day (QID) | ORAL | Status: DC | PRN
Start: 2016-01-03 — End: 2016-01-04

## 2016-01-03 MED ORDER — FUROSEMIDE 10 MG/ML IJ SOLN
40.0000 mg | Freq: Two times a day (BID) | INTRAMUSCULAR | Status: DC
Start: 2016-01-03 — End: 2016-01-06
  Administered 2016-01-03 – 2016-01-06 (×6): 40 mg via INTRAVENOUS
  Filled 2016-01-03 (×6): qty 4

## 2016-01-03 MED ORDER — SODIUM CHLORIDE 0.9 % IV SOLN
INTRAVENOUS | Status: DC
  Administered 2016-01-03: 14:00:00 via INTRAVENOUS

## 2016-01-03 MED ORDER — DEXTROSE 5 % IV SOLN: 500.0000 mg | INTRAVENOUS | Status: DC

## 2016-01-03 MED ORDER — HYDROMORPHONE HCL 1 MG/ML IJ SOLN
0.5000 mg | INTRAMUSCULAR | Status: DC | PRN
Start: 2016-01-03 — End: 2016-01-04

## 2016-01-03 MED ORDER — VANCOMYCIN HCL 10 G IV SOLR
1250.0000 mg | INTRAVENOUS | Status: DC
  Filled 2016-01-03: qty 1250

## 2016-01-03 MED ORDER — VANCOMYCIN HCL 10 G IV SOLR
2000.0000 mg | Freq: Once | INTRAVENOUS | Status: AC
  Administered 2016-01-03: 2000 mg via INTRAVENOUS
  Filled 2016-01-03: qty 2000

## 2016-01-03 MED ORDER — HEPARIN SODIUM (PORCINE) 5000 UNIT/ML IJ SOLN
5000.0000 [IU] | Freq: Three times a day (TID) | INTRAMUSCULAR | Status: DC
  Administered 2016-01-03 – 2016-01-06 (×9): 5000 [IU] via SUBCUTANEOUS
  Filled 2016-01-03 (×9): qty 1

## 2016-01-03 NOTE — ED Notes (Addendum)
Per EMS, pt coming from from [place] facility due to having seizures since 700 this morning. EMS arrived finding pt to be having full body seizure. IO placed in left tibia and 5mg  of versed given which stopped the seizure activity. Pt placed on NRB by EMS and NPA placed. Pt unresponsive qith a GCS of 3 at this time. MD and family at the bedside.

## 2016-01-03 NOTE — Progress Notes (Signed)
Pt arrived to unit & was placed in bed. telebox number [phone] was placed on pt. Pt family was given instructions on how to call for assistance. Suction was set up at bedside. CCMD was notified

## 2016-01-03 NOTE — Consult Note (Signed)
Requesting Physician: Dr.  Regenia Skeeter    Reason for consultation: Evaluate for seizures  HPI:                                                                                                                                         [name] is an 65 y.o. female patient who has been a nursing home resident for past 3 years, poorly ambulatory due to chronic diabetic complications in legs. He has severe congestive heart failure and several other metabolic and medical problems. She apparently started having worsening mental status last night, was brought into the ER for further evaluation. She had a seizure in the ER. His also some history about several seizures and the nursing facility overnight. Has been was at bedside to provide some history about her past medical problems, currently unable to provide any detailed history about the events overnight in the nursing home.     Past Medical History: Past Medical History  Diagnosis Date  . Asthma   . Bronchitis   . Allergic rhinitis   . HTN (hypertension)   . Hyperlipidemia   . Obesity   . OSA (obstructive sleep apnea)     poor compliance with cpap  . CHF (congestive heart failure) ([place])   . Biventricular failure ([place])     compensated  . Psoriasis   . Stasis edema     of lower extremities  . Depression   . Situational stress   . Anemia   . Gout   . Chronic anticoagulation     off due to GIB, thigh hematoma  . Yeast infection involving the vagina and surrounding area   . Atrial fibrillation with RVR ([place])   . GERD (gastroesophageal reflux disease)   . Chronic bronchitis ([place])     "get it q yr" (08/10/2013)  . Pneumonia     "said I did on 08/03/2013 then they ruled it out" (08/10/2013)  . Shortness of breath     "all the time" (08/10/2013)  . On home oxygen therapy     "2L during the night and prn during the day" (08/10/2013)  . Type II diabetes mellitus ([place])   . History of blood transfusion     "few during my lifetime; last time  was 4 days ago when I got 2 units" (08/10/2013)  . Stroke [facility]) ?2007    denies residuals on 08/10/2013  . DJD (degenerative joint disease)   . Bleeding     "from my skin folds; just won't stop" (08/10/2013)  . Complication of anesthesia     "they gave me too much; I stayed out of it for awhile" (08/10/2013)  . Anxiety   . Kidney stones   . Kidney disease, chronic, stage III (GFR 30-59 ml/min)   . Chronic kidney disease, stage IV (severe) ([place])   . Morbid obesity ([place])   . Diastolic CHF ([place])  Past Surgical History  Procedure Laterality Date  . Appendectomy    . Cholecystectomy    . Total knee arthroplasty Left ~ 2006  . Total abdominal hysterectomy    . Back surgery    . Cystectomy Left     hand  . Tonsillectomy    . Joint replacement    . Lumbar disc surgery    . Colonoscopy N/A 08/16/2013    Procedure: COLONOSCOPY;  Surgeon: Beryle Beams, MD;  Location: [facility];  Service: Endoscopy;  Laterality: N/A;  . Hematoma evacuation Right 09/10/2013    Procedure: EVACUATION OF RIGHT LEG HEMATOMA AND EXCISION [place] RIGHT LEG ;  Surgeon: Merrie Roof, MD;  Location: [facility];  Service: General;  Laterality: Right;  . Debridement and closure wound Right 09/22/2013    Procedure: IRRIGATION AND DEBRIDEMENT SURGICAL PREP PLACEMENT OF [place]  ;  Surgeon: Irene Limbo, MD;  Location: [facility];  Service: Plastics;  Laterality: Right;  . Skin split graft Right 10/07/2013    Procedure: SPLIT THICKNESS SKIN GRAFT RIGHT THIGH TO RIGHT LEG, PLACEMENT OF A CEL AND WOUND VAC TO RIGHT THIGH  ;  Surgeon: Irene Limbo, MD;  Location: [facility];  Service: Plastics;  Laterality: Right;    Family History: Family History  Problem Relation Age of Onset  . Heart attack Father   . Asthma Father   . Heart disease Paternal Uncle   . Rectal cancer Paternal Aunt   . Other Mother     mva    Social History:   reports that she quit smoking about 33 years ago. Her smoking use included  Cigarettes. She has a 1.44 pack-year smoking history. She has quit using smokeless tobacco. She reports that she does not drink alcohol or use illicit drugs.  Allergies:  Allergies  Allergen Reactions  . Daptomycin Other (See Comments)    Elevated CPK  . Morphine And Related Nausea And Vomiting  . Cephalexin Rash  . Tramadol Nausea And Vomiting  . Atenolol     unknown  . Erythromycin     unknown  . Celecoxib Other (See Comments)    Unknown, can use furosemide without issue  . Codeine Other (See Comments)    unknown  . Fluoxetine Hcl Other (See Comments)    unknown  . Latex Other (See Comments)  . Ofloxacin Other (See Comments)    unknown  . Penicillins Other (See Comments)    Unknown  Has patient had a PCN reaction causing immediate rash, facial/tongue/throat swelling, SOB or lightheadedness with hypotension: unknown Has patient had a PCN reaction causing severe rash involving mucus membranes or skin necrosis: unknown Has patient had a PCN reaction that required hospitalization unknown Has patient had a PCN reaction occurring within the last 10 years: unknown If all of the above answers are "NO", then may proceed with Cephalosporin use.  [name] Rofecoxib Other (See Comments)    unknown  . Sulfonamide Derivatives Other (See Comments)    unknown     Medications:  Current facility-administered medications:  .  0.9 %  sodium chloride infusion, , Intravenous, Continuous, Waldemar Dickens, MD .  Derrill Memo ON 01/04/2016] azithromycin (ZITHROMAX) 500 mg in dextrose 5 % 250 mL IVPB, 500 mg, Intravenous, Q24H, Sherwood Gambler, MD .  furosemide (LASIX) injection 40 mg, 40 mg, Intravenous, BID, Waldemar Dickens, MD .  heparin injection 5,000 Units, 5,000 Units, Subcutaneous, 3 times per day, Waldemar Dickens, MD .  hydrALAZINE (APRESOLINE) injection 5-10 mg, 5-10 mg, Intravenous,  Q4H PRN, Waldemar Dickens, MD .  HYDROmorphone (DILAUDID) injection 0.5 mg, 0.5 mg, Intravenous, Q4H PRN, Waldemar Dickens, MD .  imipenem-cilastatin (PRIMAXIN) 250 mg in sodium chloride 0.9 % 100 mL IVPB, 250 mg, Intravenous, 4 times per day, Waldemar Dickens, MD .  labetalol (NORMODYNE,TRANDATE) injection 5 mg, 5 mg, Intravenous, Q2H PRN, Waldemar Dickens, MD .  LORazepam (ATIVAN) injection 2 mg, 2 mg, Intravenous, Q4H PRN, Waldemar Dickens, MD .  ondansetron [facility]) tablet 4 mg, 4 mg, Oral, Q6H PRN **OR** ondansetron (ZOFRAN) injection 4 mg, 4 mg, Intravenous, Q6H PRN, Waldemar Dickens, MD .  pantoprazole (PROTONIX) injection 40 mg, 40 mg, Intravenous, Q24H, Waldemar Dickens, MD .  sodium chloride flush (NS) 0.9 % injection 3 mL, 3 mL, Intravenous, Q12H, Waldemar Dickens, MD .  Derrill Memo ON 01/04/2016] vancomycin (VANCOCIN) 1,250 mg in sodium chloride 0.9 % 250 mL IVPB, 1,250 mg, Intravenous, Q24H, Rachel L Rumbarger, RPH .  vancomycin (VANCOCIN) 2,000 mg in sodium chloride 0.9 % 500 mL IVPB, 2,000 mg, Intravenous, Once, Para March, RPH, Last Rate: 250 mL/hr at 01/03/16 1259, 2,000 mg at 01/03/16 1259  Current outpatient prescriptions:  .  acetaminophen (TYLENOL) 500 MG tablet, Take 500 mg by mouth every 6 (six) hours., Disp: , Rfl:  .  albuterol (PROVENTIL) (2.5 MG/3ML) 0.083% nebulizer solution, Take 2.5 mg by nebulization every 6 (six) hours as needed for wheezing or shortness of breath., Disp: , Rfl:  .  allopurinol (ZYLOPRIM) 100 MG tablet, Take 100 mg by mouth daily. Patient takes at 0800am daily, Disp: , Rfl:  .  amLODipine (NORVASC) 10 MG tablet, Take 10 mg by mouth at bedtime. , Disp: , Rfl:  .  atorvastatin (LIPITOR) 20 MG tablet, Take 20 mg by mouth at bedtime. Patient takes at 2100, Disp: , Rfl:  .  cyanocobalamin (,VITAMIN B-12,) 1000 MCG/ML injection, Inject 1,000 mcg into the muscle every 30 (thirty) days., Disp: , Rfl:  .  feeding supplement (BOOST / RESOURCE BREEZE) LIQD, Take 1  Container by mouth 2 (two) times daily between meals., Disp: , Rfl:  .  ferrous sulfate 325 (65 FE) MG tablet, Take 325 mg by mouth 3 (three) times daily with meals., Disp: , Rfl:  .  hydrALAZINE (APRESOLINE) 100 MG tablet, Take 100 mg by mouth 3 (three) times daily., Disp: , Rfl:  .  hydrALAZINE (APRESOLINE) 25 MG tablet, Take 5 tablets (125 mg total) by mouth 3 (three) times daily. (Patient taking differently: Take 25 mg by mouth 3 (three) times daily. ), Disp: 90 tablet, Rfl: 0 .  isosorbide dinitrate (ISORDIL) 30 MG tablet, Take 1 tablet (30 mg total) by mouth 2 (two) times daily., Disp: , Rfl:  .  metoCLOPramide (REGLAN) 5 MG tablet, Take 5 mg by mouth 3 (three) times daily., Disp: , Rfl:  .  metoprolol tartrate (LOPRESSOR) 25 MG tablet, Take 6.25 mg by mouth 2 (two) times daily. , Disp: , Rfl:  .  omeprazole (PRILOSEC)  20 MG capsule, Take 20 mg by mouth 2 (two) times daily before a meal. , Disp: , Rfl:  .  oxycodone (OXY-IR) 5 MG capsule, Take 5 mg by mouth every 4 (four) hours as needed for pain., Disp: , Rfl:  .  OXYGEN, Inhale 2 L into the lungs continuous., Disp: , Rfl:  .  Polyethyl Glycol-Propyl Glycol (SYSTANE) 0.4-0.3 % SOLN, Apply 1 drop to eye 4 (four) times daily., Disp: , Rfl:  .  polyethylene glycol (MIRALAX / GLYCOLAX) packet, Take 17 g by mouth 2 (two) times daily., Disp: , Rfl:  .  torsemide (DEMADEX) 20 MG tablet, Take 40 mg by mouth daily. , Disp: , Rfl:  .  traZODone (DESYREL) 50 MG tablet, Take 50 mg by mouth at bedtime. , Disp: , Rfl:  .  Vitamin D, Ergocalciferol, (DRISDOL) 50000 units CAPS capsule, Take 50,000 Units by mouth 3 (three) times a week., Disp: , Rfl:    ROS:                                                                                                                                       History  unobtainable from patient due to mental status  Neurologic Examination:                                                                                                       Blood pressure 148/60, pulse 74, temperature 100.1 F (37.8 C), temperature source Rectal, resp. rate 15, SpO2 100 %.  Evaluation of higher integrative functions including: Level of alertness:  Very drowsy, unable to follow commands,  Oriented to time, place and person - unable to assess  Recent and remote memory -unable to assess Attention span and concentration  - unable to assess Speech:  Nonverbal.  Test the following cranial nerves:  Midline gaze, pupils equal and reactive, facial grimace appears symmetric  Motor examination:  Does not follow commands, minimal withdrawal to stimulus in all extremities  Examination of sensation :  Unable to assess Examination of deep tendon reflexes:  Trace in biceps/ triceps, absent rest, normal plantars bilaterally Test coordination: no abnormal involuntary movements or tremors noted.  Gait: Deferred   Lab Results: Basic Metabolic Panel:  Recent Labs Lab 01/03/16 0935 01/03/16 0941  NA 147* 147*  K 3.3* 3.3*  CL 111 109  CO2 20*  --   GLUCOSE 228* 220*  BUN 99* 102*  CREATININE 1.94* 1.60*  CALCIUM 8.5*  --  Liver Function Tests:  Recent Labs Lab 01/03/16 0935  AST 27  ALT 12*  ALKPHOS 108  BILITOT 0.2*  PROT 6.4*  ALBUMIN 2.6*   No results for input(s): LIPASE, AMYLASE in the last 168 hours. No results for input(s): AMMONIA in the last 168 hours.  CBC:  Recent Labs Lab 01/03/16 0935 01/03/16 0941  WBC 15.5*  --   NEUTROABS 13.8*  --   HGB 11.1* 13.6  HCT 35.4* 40.0  MCV 98.3  --   PLT 256  --     Cardiac Enzymes: No results for input(s): CKTOTAL, CKMB, CKMBINDEX, TROPONINI in the last 168 hours.  Lipid Panel: No results for input(s): CHOL, TRIG, HDL, CHOLHDL, VLDL, LDLCALC in the last 168 hours.  CBG:  Recent Labs Lab 01/03/16 0928  GLUCAP 181*    Microbiology: Results for orders placed or performed during the hospital encounter of 01/25/14  Urine culture     Status: None    Collection Time: 01/25/14  3:23 PM  Result Value Ref Range Status   Specimen Description URINE, CATHETERIZED  Final   Special Requests NONE  Final   Culture  Setup Time   Final    01/25/2014 22:55 Performed at [facility]   Final    >=100,000 COLONIES/ML Performed at [facility]   Culture   Final    Multiple bacterial morphotypes present, none predominant. Suggest appropriate recollection if clinically indicated. Performed at [facility]   Report Status 01/27/2014 FINAL  Final  Surgical pcr screen     Status: None   Collection Time: 01/26/14  1:15 AM  Result Value Ref Range Status   MRSA, PCR NEGATIVE NEGATIVE Final   Staphylococcus aureus NEGATIVE NEGATIVE Final    Comment:        The Xpert SA Assay (FDA approved for NASAL specimens in patients over 42 years of age), is one component of a comprehensive surveillance program.  Test performance has been validated by [employer] for patients greater than or equal to 3 year old. It is not intended to diagnose infection nor to guide or monitor treatment.     Imaging: Ct Head Wo Contrast  01/03/2016  CLINICAL DATA:  History is seizure for 2 hours before coming to the emergency department. EXAM: CT HEAD WITHOUT CONTRAST TECHNIQUE: Contiguous axial images were obtained from the base of the skull through the vertex without intravenous contrast. COMPARISON:  10/08/2009 FINDINGS: There is no evidence of mass effect, midline shift, or extra-axial fluid collections. There is no evidence of a space-occupying lesion or intracranial hemorrhage. There is no evidence of a cortical-based area of acute infarction. 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 There is a small old left parietal lobe infarct with encephalomalacia. There is generalized cerebral atrophy. There is periventricular white matter low attenuation likely secondary to microangiopathy. The ventricles and sulci are  appropriate for the patient's age. The basal cisterns are patent. Visualized portions of the orbits are unremarkable. The visualized portions of the paranasal sinuses and mastoid air cells are unremarkable. Cerebrovascular atherosclerotic calcifications are noted. The osseous structures are unremarkable. IMPRESSION: No acute intracranial pathology. Electronically Signed   By: Kathreen Devoid   On: 01/03/2016 10:14   Dg Chest Port 1 View  01/03/2016  CLINICAL DATA:  Seizures since 7 a.m. today. EXAM: PORTABLE CHEST 1 VIEW COMPARISON:  01/25/2014. FINDINGS: Poor inspiration. Enlarged cardiac silhouette with overall improvement. Mild patchy opacity in the right  upper lobe. Prominent interstitial markings elsewhere in both lungs. Thoracic spine degenerative changes. Cholecystectomy clips. IMPRESSION: 1. Mild patchy pneumonia or aspiration pneumonitis in the right upper lobe. 2. Cardiomegaly and chronic interstitial lung disease. Electronically Signed   By: Claudie Revering M.D.   On: 01/03/2016 10:18    Assessment and plan:   [name] is an 65 y.o. female patient who  was brought in from a nursing facility after possibly multiple seizures overnight, and also had seizure in the ER is better ER physician. Was given Ativan in the ER. Patient is very drowsy, not following commands. A stat EEG was done in the ER which showed background delta slowing with triphasic waves, suggestive of severe encephalopathy. Head CT did not show any acute pathology. After long discussion with patient's husband , he declined any aggressive measures. Patient is DO NOT RESUSCITATE/DNI.  As no evidence of ongoing seizure activity seen on the EEG, do not recommend starting any antiepileptic medications at this time. No further neurodiagnostic testing from neurology standpoint. Please call if any further questions.  Discussed with ER physician, Dr. Regenia Skeeter.

## 2016-01-03 NOTE — Progress Notes (Signed)
Received report from [facility] in the ED who states pt is on venti mask with O2 sat in 95% and he will recheck CBG & temp prior to bringing to the floor.

## 2016-01-03 NOTE — ED Provider Notes (Signed)
CSN: [id number]     Arrival date & time 01/03/16  [id number] History   First MD Initiated Contact with Patient 01/03/16 [phone]     Chief Complaint  Patient presents with  . Seizures     (Consider location/radiation/quality/duration/timing/severity/associated sxs/prior Treatment) HPI  65 year old female presents with seizures. History is told by EMS as the patient is altered. Patient was up really having a grand mal seizures since 7 AM. It is now 920. Patient had seizure activity for about 15 minutes with EMS. They gave her Valium through an IO that resulted in termination of the seizure. She has been snoring respirations since. A nasal trumpet was placed and she is place on oxygen. Unclear she has a prior history of seizures but it is known that she is DO NOT RESUSCITATE. Further history from the husband states that the patient has had a severe progressive decline in overall state for several years which is why she is in the nursing home. No prior history of seizures. He came in to the nursing home to visit her and noticed her seizing. No medicines or interventions were done at the facility.  Past Medical History  Diagnosis Date  . Asthma   . Bronchitis   . Allergic rhinitis   . HTN (hypertension)   . Hyperlipidemia   . Obesity   . OSA (obstructive sleep apnea)     poor compliance with cpap  . CHF (congestive heart failure) ([place])   . Biventricular failure ([place])     compensated  . Psoriasis   . Stasis edema     of lower extremities  . Depression   . Situational stress   . Anemia   . Gout   . Chronic anticoagulation     off due to GIB, thigh hematoma  . Yeast infection involving the vagina and surrounding area   . Atrial fibrillation with RVR ([place])   . GERD (gastroesophageal reflux disease)   . Chronic bronchitis ([place])     "get it q yr" (08/10/2013)  . Pneumonia     "said I did on 08/03/2013 then they ruled it out" (08/10/2013)  . Shortness of breath     "all the time" (08/10/2013)  . On  home oxygen therapy     "2L during the night and prn during the day" (08/10/2013)  . Type II diabetes mellitus ([place])   . History of blood transfusion     "few during my lifetime; last time was 4 days ago when I got 2 units" (08/10/2013)  . Stroke [facility]) ?2007    denies residuals on 08/10/2013  . DJD (degenerative joint disease)   . Bleeding     "from my skin folds; just won't stop" (08/10/2013)  . Complication of anesthesia     "they gave me too much; I stayed out of it for awhile" (08/10/2013)  . Anxiety   . Kidney stones   . Kidney disease, chronic, stage III (GFR 30-59 ml/min)   . Chronic kidney disease, stage IV (severe) ([place])   . Morbid obesity [facility])    Past Surgical History  Procedure Laterality Date  . Appendectomy    . Cholecystectomy    . Total knee arthroplasty Left ~ 2006  . Total abdominal hysterectomy    . Back surgery    . Cystectomy Left     hand  . Tonsillectomy    . Joint replacement    . Lumbar disc surgery    . Colonoscopy N/A 08/16/2013    Procedure:  COLONOSCOPY;  Surgeon: Beryle Beams, MD;  Location: [facility];  Service: Endoscopy;  Laterality: N/A;  . Hematoma evacuation Right 09/10/2013    Procedure: EVACUATION OF RIGHT LEG HEMATOMA AND EXCISION [place] RIGHT LEG ;  Surgeon: Merrie Roof, MD;  Location: [facility];  Service: General;  Laterality: Right;  . Debridement and closure wound Right 09/22/2013    Procedure: IRRIGATION AND DEBRIDEMENT SURGICAL PREP PLACEMENT OF [place]  ;  Surgeon: Irene Limbo, MD;  Location: [facility];  Service: Plastics;  Laterality: Right;  . Skin split graft Right 10/07/2013    Procedure: SPLIT THICKNESS SKIN GRAFT RIGHT THIGH TO RIGHT LEG, PLACEMENT OF A CEL AND WOUND VAC TO RIGHT THIGH  ;  Surgeon: Irene Limbo, MD;  Location: [facility];  Service: Plastics;  Laterality: Right;   Family History  Problem Relation Age of Onset  . Heart attack Father   . Asthma Father   . Heart disease Paternal Uncle   . Rectal cancer  Paternal Aunt   . Other Mother     mva   Social History  Substance Use Topics  . Smoking status: Former Smoker -- 0.12 packs/day for 12 years    Types: Cigarettes    Quit date: 07/05/1982  . Smokeless tobacco: Former Systems developer     Comment: smoked for 1.5 yrs about 2 cigs a day ,only if stressed  . Alcohol Use: No   OB History    No data available     Review of Systems  Unable to perform ROS: Patient unresponsive      Allergies  Daptomycin; Morphine and related; Cephalexin; Tramadol; Atenolol; Erythromycin; Celecoxib; Codeine; Fluoxetine hcl; Latex; Ofloxacin; Penicillins; Rofecoxib; and Sulfonamide derivatives  Home Medications   Prior to Admission medications   Medication Sig Start Date End Date Taking? Authorizing Provider  albuterol (PROVENTIL HFA;VENTOLIN HFA) 108 (90 BASE) MCG/ACT inhaler Inhale 2 puffs into the lungs every 6 (six) hours as needed for wheezing or shortness of breath.     Historical Provider, MD  albuterol (PROVENTIL) (2.5 MG/3ML) 0.083% nebulizer solution Take 2.5 mg by nebulization every 6 (six) hours as needed for wheezing or shortness of breath.    Historical Provider, MD  allopurinol (ZYLOPRIM) 100 MG tablet Take 100 mg by mouth daily. Patient takes at 0800am daily 09/16/11   Historical Provider, MD  amLODipine (NORVASC) 10 MG tablet Take 10 mg by mouth daily. Patient takes at 0800am daily 08/19/13   Thurnell Lose, MD  atorvastatin (LIPITOR) 20 MG tablet Take 20 mg by mouth at bedtime. Patient takes at 2100    Historical Provider, MD  hydrALAZINE (APRESOLINE) 25 MG tablet Take 5 tablets (125 mg total) by mouth 3 (three) times daily. 12/15/13   Nita Sells, MD  insulin glargine (LANTUS) 100 UNIT/ML injection Inject 15 Units into the skin 2 (two) times daily.    Historical Provider, MD  insulin lispro (HUMALOG) 100 UNIT/ML injection 201-250= 2 units, 251-300= 4 units, 301-350=6 units, 351-400= 8 units, >400= 10 units and call MD    Historical Provider,  MD  isosorbide dinitrate (ISORDIL) 30 MG tablet Take 1 tablet (30 mg total) by mouth 2 (two) times daily. 01/28/14   Charlynne Cousins, MD  metoCLOPramide (REGLAN) 5 MG tablet Take 5 mg by mouth 3 (three) times daily.    Historical Provider, MD  metoprolol tartrate (LOPRESSOR) 25 MG tablet Take 6.25 mg by mouth 2 (two) times daily.     Historical Provider, MD  omeprazole (PRILOSEC) 20 MG capsule Take 20 mg by mouth 2 (two) times daily before a meal.     Historical Provider, MD  polyethylene glycol (MIRALAX / GLYCOLAX) packet Take 17 g by mouth 2 (two) times daily.    Historical Provider, MD  torsemide (DEMADEX) 20 MG tablet Take 40 mg by mouth daily.     Historical Provider, MD  traZODone (DESYREL) 50 MG tablet Take 50 mg by mouth at bedtime.     Historical Provider, MD   BP 134/68 mmHg  Pulse 30  Resp 30  SpO2 96% Physical Exam  Constitutional: She appears well-developed and well-nourished.  HENT:  Head: Normocephalic and atraumatic.  Right Ear: External ear normal.  Left Ear: External ear normal.  Nose: Nose normal.  Eyes: Pupils are equal, round, and reactive to light. Right eye exhibits no discharge. Left eye exhibits no discharge.  Neck: Neck supple.  Cardiovascular: Normal heart sounds.  An irregularly irregular rhythm present. Tachycardia present.   Pulmonary/Chest: Effort normal. Tachypnea noted. She has rhonchi.  Abdominal: Soft. There is no tenderness.  Neurological: She is unresponsive.  Does not move extremities to pain  Skin: Skin is warm and dry.  Nursing note and vitals reviewed.   ED Course  Procedures (including critical care time) Labs Review Labs Reviewed  CBC WITH DIFFERENTIAL/PLATELET - Abnormal; Notable for the following:    WBC 15.5 (*)    RBC 3.60 (*)    Hemoglobin 11.1 (*)    HCT 35.4 (*)    Neutro Abs 13.8 (*)    All other components within normal limits  COMPREHENSIVE METABOLIC PANEL - Abnormal; Notable for the following:    Sodium 147 (*)     Potassium 3.3 (*)    CO2 20 (*)    Glucose, Bld 228 (*)    BUN 99 (*)    Creatinine, Ser 1.94 (*)    Calcium 8.5 (*)    Total Protein 6.4 (*)    Albumin 2.6 (*)    ALT 12 (*)    Total Bilirubin 0.2 (*)    GFR calc non Af Amer 26 (*)    GFR calc Af Amer 30 (*)    Anion gap 16 (*)    All other components within normal limits  URINALYSIS, ROUTINE W REFLEX MICROSCOPIC (NOT AT [facility]) - Abnormal; Notable for the following:    APPearance CLOUDY (*)    Protein, ur 100 (*)    Leukocytes, UA SMALL (*)    All other components within normal limits  URINE MICROSCOPIC-ADD ON - Abnormal; Notable for the following:    Squamous Epithelial / LPF 0-5 (*)    All other components within normal limits  CBG MONITORING, ED - Abnormal; Notable for the following:    Glucose-Capillary 181 (*)    All other components within normal limits  I-STAT CHEM 8, ED - Abnormal; Notable for the following:    Sodium 147 (*)    Potassium 3.3 (*)    BUN 102 (*)    Creatinine, Ser 1.60 (*)    Glucose, Bld 220 (*)    Calcium, Ion 1.07 (*)    All other components within normal limits  I-STAT CG4 LACTIC ACID, ED - Abnormal; Notable for the following:    Lactic Acid, Venous 7.19 (*)    All other components within normal limits  URINE CULTURE  CULTURE, BLOOD (ROUTINE X 2)  CULTURE, BLOOD (ROUTINE X 2)  CULTURE, EXPECTORATED SPUTUM-ASSESSMENT  GRAM STAIN  STREP PNEUMONIAE  URINARY ANTIGEN  MAGNESIUM  PHOSPHORUS  LACTIC ACID, PLASMA  CK  BLOOD GAS, ARTERIAL  HIV ANTIBODY (ROUTINE TESTING)  LACTIC ACID, PLASMA  HEMOGLOBIN A1C  I-STAT CG4 LACTIC ACID, ED    Imaging Review Ct Head Wo Contrast  01/03/2016  CLINICAL DATA:  History is seizure for 2 hours before coming to the emergency department. EXAM: CT HEAD WITHOUT CONTRAST TECHNIQUE: Contiguous axial images were obtained from the base of the skull through the vertex without intravenous contrast. COMPARISON:  10/08/2009 FINDINGS: There is no evidence of mass effect,  midline shift, or extra-axial fluid collections. There is no evidence of a space-occupying lesion or intracranial hemorrhage. There is no evidence of a cortical-based area of acute infarction. 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 There is a small old left parietal lobe infarct with encephalomalacia. There is generalized cerebral atrophy. There is periventricular white matter low attenuation likely secondary to microangiopathy. The ventricles and sulci are appropriate for the patient's age. The basal cisterns are patent. Visualized portions of the orbits are unremarkable. The visualized portions of the paranasal sinuses and mastoid air cells are unremarkable. Cerebrovascular atherosclerotic calcifications are noted. The osseous structures are unremarkable. IMPRESSION: No acute intracranial pathology. Electronically Signed   By: Kathreen Devoid   On: 01/03/2016 10:14   Dg Chest Port 1 View  01/03/2016  CLINICAL DATA:  Seizures since 7 a.m. today. EXAM: PORTABLE CHEST 1 VIEW COMPARISON:  01/25/2014. FINDINGS: Poor inspiration. Enlarged cardiac silhouette with overall improvement. Mild patchy opacity in the right upper lobe. Prominent interstitial markings elsewhere in both lungs. Thoracic spine degenerative changes. Cholecystectomy clips. IMPRESSION: 1. Mild patchy pneumonia or aspiration pneumonitis in the right upper lobe. 2. Cardiomegaly and chronic interstitial lung disease. Electronically Signed   By: Claudie Revering M.D.   On: 01/03/2016 10:18   I have personally reviewed and evaluated these images and lab results as part of my medical decision-making.   EKG Interpretation   Date/Time:  Wednesday January 03 2016 09:26:12 EST Ventricular Rate:  162 PR Interval:    QRS Duration: 71 QT Interval:  287 QTC Calculation: 471 R Axis:   72 Text Interpretation:  Atrial fibrillation with rapid V-rate Repolarization  abnormality, prob rate related a fib new with RVR compared to May  2016  Confirmed by Regenia Skeeter  MD, [place] ([id number]) on 01/03/2016 10:46:19 AM      MDM   Final diagnoses:  HCAP (healthcare-associated pneumonia)  Seizure ([place])    Patient is initially unresponsive. Patient has had prolonged seizures, appears to be post-ictal after being given benzos. I had a long discussion with husband who confirms she is DNR and would not want aggressive care. He agrees to initial seizure workup such as CT head and electrolytes. He is visibly states he would not want to cause her pain such as with a lumbar puncture if this was indicated. Workup shows a low-grade rectal temperature of 100.1 as well as possible pneumonia. Unclear if this is aspiration or not. Will be covered with broad antibiotics given that she lives in a nursing home. Initial lactate over 7 although given that she had just finished seizing this is likely the cause as it rapidly cleared with minimal fluids. Husband would like her treated with antibiotics, she does not signal really improve her mental status than he would like for her to be placed in comfort care. Would not want pressors or central line. Heart rate spontaneously converted into a normal rate after being in  A. fib with RVR. Admit to the hospitalist.    Sherwood Gambler, MD 01/03/16 1630

## 2016-01-03 NOTE — ED Notes (Signed)
Dr. Saralyn Pilar notified that pt had an episode of vomiting pt was sitting above 30 degrees. Pt repositioned to sitting at 90 degrees. Pt leaned forward and suctioned. SpO2 dropped to 80%. Pt placed on NRB and improved SpO2 to 100%. Pt also given Zofran.

## 2016-01-03 NOTE — H&P (Signed)
[facility] History and Physical  ANGINETTE MICHALS [id number] DOB: 08/22/1951 DOA: 01/03/2016  Referring physician: Dr Regenia Skeeter - [facility] PCP: Garwin Brothers, MD   Chief Complaint: Szr  HPI: ODESSER PERLBERG is a 65 y.o. female  Level V caveat: Patient presenting in post ictal state and unable to provide history. History provided on limited basis by EDP, husband, nursing home facility staff.  Per report by daytime nurse at [facility] home patient was in a persistent state of seizure as of 8 AM. As of report given to her patient had intermittent seizures for an unknown amount of time prior to the start of her shift which by description sounds like it was over an hour. 2 doses of Ativan were given at the nursing home with out any benefit. There is considerable discussion had between patient's husband and sons who were debating on allowing patient to stay at the nursing home and continued to seize and/or pass versus bringing her to the emergency room for treatment. The decision was eventually made to bring patient to the emergency room. An additional dose of Ativan was given by EMS and wrote to [facility] emergency room at which time patient stopped seizing at time of arrival at approximately 9 AM. Patient has been in her normal state of health prior to this event.   Of note patient is a long term resident at nursing home due to multiple physical/musculoskeletal conditions and physical deconditioning.  Review of Systems:  Unable to obtain further due to pts postictal state  Past Medical History  Diagnosis Date  . Asthma   . Bronchitis   . Allergic rhinitis   . HTN (hypertension)   . Hyperlipidemia   . Obesity   . OSA (obstructive sleep apnea)     poor compliance with cpap  . CHF (congestive heart failure) ([place])   . Biventricular failure ([place])     compensated  . Psoriasis   . Stasis edema     of lower extremities  . Depression   . Situational stress   . Anemia   . Gout    . Chronic anticoagulation     off due to GIB, thigh hematoma  . Yeast infection involving the vagina and surrounding area   . Atrial fibrillation with RVR ([place])   . GERD (gastroesophageal reflux disease)   . Chronic bronchitis ([place])     "get it q yr" (08/10/2013)  . Pneumonia     "said I did on 08/03/2013 then they ruled it out" (08/10/2013)  . Shortness of breath     "all the time" (08/10/2013)  . On home oxygen therapy     "2L during the night and prn during the day" (08/10/2013)  . Type II diabetes mellitus ([place])   . History of blood transfusion     "few during my lifetime; last time was 4 days ago when I got 2 units" (08/10/2013)  . Stroke [facility]) ?2007    denies residuals on 08/10/2013  . DJD (degenerative joint disease)   . Bleeding     "from my skin folds; just won't stop" (08/10/2013)  . Complication of anesthesia     "they gave me too much; I stayed out of it for awhile" (08/10/2013)  . Anxiety   . Kidney stones   . Kidney disease, chronic, stage III (GFR 30-59 ml/min)   . Chronic kidney disease, stage IV (severe) ([place])   . Morbid obesity ([place])   . Diastolic CHF ([place])  Past Surgical History  Procedure Laterality Date  . Appendectomy    . Cholecystectomy    . Total knee arthroplasty Left ~ 2006  . Total abdominal hysterectomy    . Back surgery    . Cystectomy Left     hand  . Tonsillectomy    . Joint replacement    . Lumbar disc surgery    . Colonoscopy N/A 08/16/2013    Procedure: COLONOSCOPY;  Surgeon: Beryle Beams, MD;  Location: [facility];  Service: Endoscopy;  Laterality: N/A;  . Hematoma evacuation Right 09/10/2013    Procedure: EVACUATION OF RIGHT LEG HEMATOMA AND EXCISION [place] RIGHT LEG ;  Surgeon: Merrie Roof, MD;  Location: [facility];  Service: General;  Laterality: Right;  . Debridement and closure wound Right 09/22/2013    Procedure: IRRIGATION AND DEBRIDEMENT SURGICAL PREP PLACEMENT OF [place]  ;  Surgeon: Irene Limbo, MD;  Location: [facility];   Service: Plastics;  Laterality: Right;  . Skin split graft Right 10/07/2013    Procedure: SPLIT THICKNESS SKIN GRAFT RIGHT THIGH TO RIGHT LEG, PLACEMENT OF A CEL AND WOUND VAC TO RIGHT THIGH  ;  Surgeon: Irene Limbo, MD;  Location: [facility];  Service: Plastics;  Laterality: Right;   Social History:  reports that she quit smoking about 33 years ago. Her smoking use included Cigarettes. She has a 1.44 pack-year smoking history. She has quit using smokeless tobacco. She reports that she does not drink alcohol or use illicit drugs.  Allergies  Allergen Reactions  . Daptomycin Other (See Comments)    Elevated CPK  . Morphine And Related Nausea And Vomiting  . Cephalexin Rash  . Tramadol Nausea And Vomiting  . Atenolol     unknown  . Erythromycin     unknown  . Celecoxib Other (See Comments)    Unknown, can use furosemide without issue  . Codeine Other (See Comments)    unknown  . Fluoxetine Hcl Other (See Comments)    unknown  . Latex Other (See Comments)  . Ofloxacin Other (See Comments)    unknown  . Penicillins Other (See Comments)    Unknown  Has patient had a PCN reaction causing immediate rash, facial/tongue/throat swelling, SOB or lightheadedness with hypotension: unknown Has patient had a PCN reaction causing severe rash involving mucus membranes or skin necrosis: unknown Has patient had a PCN reaction that required hospitalization unknown Has patient had a PCN reaction occurring within the last 10 years: unknown If all of the above answers are "NO", then may proceed with Cephalosporin use.  [name] Rofecoxib Other (See Comments)    unknown  . Sulfonamide Derivatives Other (See Comments)    unknown    Family History  Problem Relation Age of Onset  . Heart attack Father   . Asthma Father   . Heart disease Paternal Uncle   . Rectal cancer Paternal Aunt   . Other Mother     mva     Prior to Admission medications   Medication Sig Start Date End Date Taking? Authorizing  Provider  acetaminophen (TYLENOL) 500 MG tabletbloo Take 500 mg by mouth every 6 (six) hours.   Yes Historical Provider, MD  albuterol (PROVENTIL) (2.5 MG/3ML) 0.083% nebulizer solution Take 2.5 mg by nebulization every 6 (six) hours as needed for wheezing or shortness of breath.   Yes Historical Provider, MD  allopurinol (ZYLOPRIM) 100 MG tablet Take 100 mg by mouth daily. Patient takes at 0800am daily 09/16/11  Yes Historical Provider, MD  amLODipine (NORVASC) 10 MG tablet Take 10 mg by mouth at bedtime.  08/19/13  Yes Thurnell Lose, MD  atorvastatin (LIPITOR) 20 MG tablet Take 20 mg by mouth at bedtime. Patient takes at 2100   Yes Historical Provider, MD  cyanocobalamin (,VITAMIN B-12,) 1000 MCG/ML injection Inject 1,000 mcg into the muscle every 30 (thirty) days.   Yes Historical Provider, MD  feeding supplement (BOOST / RESOURCE BREEZE) LIQD Take 1 Container by mouth 2 (two) times daily between meals.   Yes Historical Provider, MD  ferrous sulfate 325 (65 FE) MG tablet Take 325 mg by mouth 3 (three) times daily with meals.   Yes Historical Provider, MD  hydrALAZINE (APRESOLINE) 100 MG tablet Take 100 mg by mouth 3 (three) times daily.   Yes Historical Provider, MD  hydrALAZINE (APRESOLINE) 25 MG tablet Take 5 tablets (125 mg total) by mouth 3 (three) times daily. Patient taking differently: Take 25 mg by mouth 3 (three) times daily.  12/15/13  Yes Nita Sells, MD  isosorbide dinitrate (ISORDIL) 30 MG tablet Take 1 tablet (30 mg total) by mouth 2 (two) times daily. 01/28/14  Yes Charlynne Cousins, MD  metoCLOPramide (REGLAN) 5 MG tablet Take 5 mg by mouth 3 (three) times daily.   Yes Historical Provider, MD  metoprolol tartrate (LOPRESSOR) 25 MG tablet Take 6.25 mg by mouth 2 (two) times daily.    Yes Historical Provider, MD  omeprazole (PRILOSEC) 20 MG capsule Take 20 mg by mouth 2 (two) times daily before a meal.    Yes Historical Provider, MD  oxycodone (OXY-IR) 5 MG capsule Take 5 mg  by mouth every 4 (four) hours as needed for pain.   Yes Historical Provider, MD  OXYGEN Inhale 2 L into the lungs continuous.   Yes Historical Provider, MD  Polyethyl Glycol-Propyl Glycol (SYSTANE) 0.4-0.3 % SOLN Apply 1 drop to eye 4 (four) times daily.   Yes Historical Provider, MD  polyethylene glycol (MIRALAX / GLYCOLAX) packet Take 17 g by mouth 2 (two) times daily.   Yes Historical Provider, MD  torsemide (DEMADEX) 20 MG tablet Take 40 mg by mouth daily.    Yes Historical Provider, MD  traZODone (DESYREL) 50 MG tablet Take 50 mg by mouth at bedtime.    Yes Historical Provider, MD  Vitamin D, Ergocalciferol, (DRISDOL) 50000 units CAPS capsule Take 50,000 Units by mouth 3 (three) times a week.   Yes Historical Provider, MD   Physical Exam: Filed Vitals:   01/03/16 1145 01/03/16 1200 01/03/16 1215 01/03/16 1245  BP: 143/82 158/72 131/70 148/60  Pulse: 83 86 81 74  Temp:      TempSrc:      Resp: 18 16 16 15   SpO2: 100% 100% 100% 100%    Wt Readings from Last 3 Encounters:  04/27/15 101.152 kg (223 lb)  09/13/14 101.152 kg (223 lb)  09/05/14 101.152 kg (223 lb)    General: Obtunded Eyes: Pupils dilated and sluggish. Attempts to follow sound with eye movement ENT: Dry mucous membranes Neck:  no LAD, masses or thyromegaly Cardiovascular:  RRR, no m/r/g. 1+ LE edema Respiratory: labored, non-rebreather in place.  Abdomen:  soft, ntnd Skin:  no rash or induration seen on limited exam Musculoskeletal: flaccid. R IO line in place Psychiatric: obtunded Neurologic: unable to assess due to mental state.          Labs on Admission:  Basic Metabolic Panel:  Recent Labs Lab 01/03/16 0935 01/03/16 [id number]  NA 147* 147*  K 3.3* 3.3*  CL 111 109  CO2 20*  --   GLUCOSE 228* 220*  BUN 99* 102*  CREATININE 1.94* 1.60*  CALCIUM 8.5*  --    Liver Function Tests:  Recent Labs Lab 01/03/16 0935  AST 27  ALT 12*  ALKPHOS 108  BILITOT 0.2*  PROT 6.4*  ALBUMIN 2.6*   No  results for input(s): LIPASE, AMYLASE in the last 168 hours. No results for input(s): AMMONIA in the last 168 hours. CBC:  Recent Labs Lab 01/03/16 0935 01/03/16 0941  WBC 15.5*  --   NEUTROABS 13.8*  --   HGB 11.1* 13.6  HCT 35.4* 40.0  MCV 98.3  --   PLT 256  --    Cardiac Enzymes: No results for input(s): CKTOTAL, CKMB, CKMBINDEX, TROPONINI in the last 168 hours.  BNP (last 3 results) No results for input(s): BNP in the last 8760 hours.  ProBNP (last 3 results) No results for input(s): PROBNP in the last 8760 hours.   Creatinine clearance cannot be calculated (Unknown ideal weight.)  CBG:  Recent Labs Lab 01/03/16 0928  GLUCAP 181*    Radiological Exams on Admission: Ct Head Wo Contrast  01/03/2016  CLINICAL DATA:  History is seizure for 2 hours before coming to the emergency department. EXAM: CT HEAD WITHOUT CONTRAST TECHNIQUE: Contiguous axial images were obtained from the base of the skull through the vertex without intravenous contrast. COMPARISON:  10/08/2009 FINDINGS: There is no evidence of mass effect, midline shift, or extra-axial fluid collections. There is no evidence of a space-occupying lesion or intracranial hemorrhage. There is no evidence of a cortical-based area of acute infarction. 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 There is a small old left parietal lobe infarct with encephalomalacia. There is generalized cerebral atrophy. There is periventricular white matter low attenuation likely secondary to microangiopathy. The ventricles and sulci are appropriate for the patient's age. The basal cisterns are patent. Visualized portions of the orbits are unremarkable. The visualized portions of the paranasal sinuses and mastoid air cells are unremarkable. Cerebrovascular atherosclerotic calcifications are noted. The osseous structures are unremarkable. IMPRESSION: No acute intracranial pathology. Electronically Signed   By: Kathreen Devoid    On: 01/03/2016 10:14   Dg Chest Port 1 View  01/03/2016  CLINICAL DATA:  Seizures since 7 a.m. today. EXAM: PORTABLE CHEST 1 VIEW COMPARISON:  01/25/2014. FINDINGS: Poor inspiration. Enlarged cardiac silhouette with overall improvement. Mild patchy opacity in the right upper lobe. Prominent interstitial markings elsewhere in both lungs. Thoracic spine degenerative changes. Cholecystectomy clips. IMPRESSION: 1. Mild patchy pneumonia or aspiration pneumonitis in the right upper lobe. 2. Cardiomegaly and chronic interstitial lung disease. Electronically Signed   By: Claudie Revering M.D.   On: 01/03/2016 10:18    Assessment/Plan Active Problems:   A-fib ([place])   HCAP (healthcare-associated pneumonia)   Seizure ([place])   Aspiration pneumonia ([place])   Essential hypertension   End of life care   Hyperglycemia   New onset Seizures: Prolonged seizures. Described as Lenis Noon. Likely ongoing for hours before cessation w/ multiple doses of ativan. Discussed case w/ Dr. Silverio Decamp of neurology. Lactic acid >7. - Tele - f/u Neuro recommendations. Regarding further workup vs palliative/comfort measures - Ativan PRN - CK - IVF - trend lactic acid  HCAP/aspiration: CXR concerning for RUL pneumonia/pneumonitis. Prolonged seizure concerning for aspiration. Lives in a NH. Lactic acid >7.  WBC 15. AFVSS.  - ABG - Vanc Azithro,  imipenem (multiple ABX allergies and treatment for HCAP and aspiration) - sputum Cx, legionella Ag, strep Ag, BCX  CKD: Cr 1.6. At baseline - BMET in am  Chronic diastolic congestive heart failure: Patient presenting without decompensation but aware that this may happen due to IV hydration. Last echo showing EF of 55% and grade 1 diastolic dysfunction. - Continue Lasix (IV hydration above for lactic acidosis)  PAfib: no anticoagulation. Initially in RVR. CHADSVASC score 6 - continue bblocker.   HTN: - hydralazine and labetolol prn IV - return to home meds when taking po  Chronic  pain: Pt lives at [facility] due to chronic MSK deconditioning.  - IV morphine  Hyperglycemia: h/o DM in past. No DM medications - A1c  HLD: - continue statin when taking PO  GERD: - PPI iv  End of life: pt likely nearing end of life. Pt stayed at [facility] seizing for hours prior to family deciding to bring her in for care. Per lengthy discussion had with husband regarding goals of care, he feels that an attempt for the next day or two should be made to get her back to her previous health but if the client is apparent to allow her to be made comfort care. Patient has been aware that there is a high likelihood that patient may not make it through her current illness. Patient is a DO NOT INTUBATE - Palliative care consult  Code Status: DNI  DVT Prophylaxis: hep Family Communication: husband Disposition Plan: Pending Improvement    MERRELL, DAVID J, MD Family Medicine [facility] [email or web address] Password [email or web address]

## 2016-01-03 NOTE — Progress Notes (Signed)
EEG Completed; Results Pending  

## 2016-01-03 NOTE — Progress Notes (Addendum)
ANTIBIOTIC CONSULT NOTE - INITIAL  Pharmacy Consult for vancomycin, aztreonam, levaquin Indication: rule out sepsis  Allergies  Allergen Reactions  . Daptomycin Other (See Comments)    Elevated CPK  . Morphine And Related Nausea And Vomiting  . Cephalexin Rash  . Tramadol Nausea And Vomiting  . Atenolol     unknown  . Erythromycin     unknown  . Celecoxib Other (See Comments)    Unknown, can use furosemide without issue  . Codeine Other (See Comments)    unknown  . Fluoxetine Hcl Other (See Comments)    unknown  . Latex Other (See Comments)  . Ofloxacin Other (See Comments)    unknown  . Penicillins Other (See Comments)    Unknown  Has patient had a PCN reaction causing immediate rash, facial/tongue/throat swelling, SOB or lightheadedness with hypotension: unknown Has patient had a PCN reaction causing severe rash involving mucus membranes or skin necrosis: unknown Has patient had a PCN reaction that required hospitalization unknown Has patient had a PCN reaction occurring within the last 10 years: unknown If all of the above answers are "NO", then may proceed with Cephalosporin use.  [name] Rofecoxib Other (See Comments)    unknown  . Sulfonamide Derivatives Other (See Comments)    unknown    Patient Measurements:   Adjusted Body Weight:   Vital Signs: BP: 168/96 mmHg (01/25 1015) Pulse Rate: 30 (01/25 0928) Intake/Output from previous day:   Intake/Output from this shift:    Labs:  Recent Labs  01/03/16 0935 01/03/16 0941  WBC 15.5*  --   HGB 11.1* 13.6  PLT 256  --   CREATININE 1.94* 1.60*   CrCl cannot be calculated (Unknown ideal weight.). No results for input(s): VANCOTROUGH, VANCOPEAK, VANCORANDOM, GENTTROUGH, GENTPEAK, GENTRANDOM, TOBRATROUGH, TOBRAPEAK, TOBRARND, AMIKACINPEAK, AMIKACINTROU, AMIKACIN in the last 72 hours.   Microbiology: No results found for this or any previous visit (from the past 720 hour(s)).  Medical History: Past Medical  History  Diagnosis Date  . Asthma   . Bronchitis   . Allergic rhinitis   . HTN (hypertension)   . Hyperlipidemia   . Obesity   . OSA (obstructive sleep apnea)     poor compliance with cpap  . CHF (congestive heart failure) ([place])   . Biventricular failure ([place])     compensated  . Psoriasis   . Stasis edema     of lower extremities  . Depression   . Situational stress   . Anemia   . Gout   . Chronic anticoagulation     off due to GIB, thigh hematoma  . Yeast infection involving the vagina and surrounding area   . Atrial fibrillation with RVR ([place])   . GERD (gastroesophageal reflux disease)   . Chronic bronchitis ([place])     "get it q yr" (08/10/2013)  . Pneumonia     "said I did on 08/03/2013 then they ruled it out" (08/10/2013)  . Shortness of breath     "all the time" (08/10/2013)  . On home oxygen therapy     "2L during the night and prn during the day" (08/10/2013)  . Type II diabetes mellitus ([place])   . History of blood transfusion     "few during my lifetime; last time was 4 days ago when I got 2 units" (08/10/2013)  . Stroke [facility]) ?2007    denies residuals on 08/10/2013  . DJD (degenerative joint disease)   . Bleeding     "from my  skin folds; just won't stop" (08/10/2013)  . Complication of anesthesia     "they gave me too much; I stayed out of it for awhile" (08/10/2013)  . Anxiety   . Kidney stones   . Kidney disease, chronic, stage III (GFR 30-59 ml/min)   . Chronic kidney disease, stage IV (severe) ([place])   . Morbid obesity ([place])     Medications:  Anti-infectives    Start     Dose/Rate Route Frequency Ordered Stop   01/05/16 1200  levofloxacin (LEVAQUIN) IVPB 750 mg     750 mg 100 mL/hr over 90 Minutes Intravenous Every 48 hours 01/03/16 1043     01/04/16 1200  vancomycin (VANCOCIN) 1,250 mg in sodium chloride 0.9 % 250 mL IVPB     1,250 mg 166.7 mL/hr over 90 Minutes Intravenous Every 24 hours 01/03/16 1043     01/03/16 2000  aztreonam (AZACTAM) 1 g in dextrose 5 % 50  mL IVPB     1 g 100 mL/hr over 30 Minutes Intravenous Every 8 hours 01/03/16 1043     01/03/16 1045  levofloxacin (LEVAQUIN) IVPB 750 mg     750 mg 100 mL/hr over 90 Minutes Intravenous  Once 01/03/16 1034     01/03/16 1045  aztreonam (AZACTAM) 2 g in dextrose 5 % 50 mL IVPB     2 g 100 mL/hr over 30 Minutes Intravenous  Once 01/03/16 1034     01/03/16 1045  vancomycin (VANCOCIN) IVPB 1000 mg/200 mL premix  Status:  Discontinued     1,000 mg 200 mL/hr over 60 Minutes Intravenous  Once 01/03/16 1034 01/03/16 1035   01/03/16 1045  vancomycin (VANCOCIN) 2,000 mg in sodium chloride 0.9 % 500 mL IVPB     2,000 mg 250 mL/hr over 120 Minutes Intravenous  Once 01/03/16 1035       Assessment: 25 yof presented to the ED with seizures from her nursing home. Found to have an elevated WBC and lactic acid. To begin empiric broad-spectrum antibiotics for possible sepsis. Scr is elevated at 1.6.    Vanc 1/25>> Aztreo 1/25>> Levaquin 1/25>>  Goal of Therapy:  Vancomycin trough level 15-20 mcg/ml  Plan:  - Vancomycin 2gm IV x 1 then 1250mg  IV Q24H - Levaquin 750mg  IV Q48H - Aztreonam 2gm IV x 1 then 1gm IV Q8H  - F/u renal fxn, C&S, clinical status and trough at [facility] *All antibiotics hand-delivered to the bedside at [place], Rande Lawman 01/03/2016,10:44 AM  Addendum: Changing levaquin and aztreonam to azithromycin and primaxin.   Plan: - Primaxin 250mg  IV Q6H - Azithromycin 500mg  IV Q24H starting tomorrow since pt already received levaquin today  Salome Arnt, PharmD, BCPS Pager # [phone] 01/03/2016 1:20 PM

## 2016-01-03 NOTE — Procedures (Signed)
History: 65 yo F presenting with seizures  Sedation: None  Technique: This is a 21 channel routine scalp EEG performed at the bedside with bipolar and monopolar montages arranged in accordance to the international 10/20 system of electrode placement. One channel was dedicated to EKG recording.    Background: The background consists predominantly of generalized irregular delta activity. There are frequent bifrontally predominant discharges with triphasic morphology. There is posterior dominant rhythm seen which is poorly sustained with a rhythm of 9 Hz.   Photic stimulation: Physiologic driving is not performed  EEG Abnormalities: 1) Triphasic waves 2) Generalized irregular slow activity.   Clinical Interpretation: This EEG is consistent with a generalized non-specific cerebral dysfunction(encephalopathy). The presence of triphasic waves can be suggestive of a toxic/metabolic encephalopathy. There was no seizure or seizure predisposition recorded on this study.   Roland Rack, MD [facility] [phone]  If 7pm- 7am, please page neurology on call as listed in [place].

## 2016-01-03 NOTE — Progress Notes (Addendum)
Call received per floor RN regarding newly admitted Patient from Ed with active seizures. Upon my arrival at 1948 2 mg Ativan given, RN awaiting call back from [facility] NP. Call back received , NP updated per floor RN. 2 mg additional Ativan ordered and given. VS obtained yielding 148 HR, 132/48 BP, 19 RR, 56% on VM 50%. HR ranging from 150-180 SVT. Seizure witnessed, stopped within 10 minutes of Ativan. Pt occasionally opens eyes spontaneously, does not respond to pain or follow commands.  Pt lung sounds course. Poor respiratory effort with accessory muscle use. Pt placed on NRB 100% with 02 sats improving to 75-80%. Xopenex NEB treatment ordered , RT en route to room. [facility] NP paged to my phone at 2007, call back received at 2015 updated on Pt status, BIPAP discussed. Due to Pt emesis earlier and somnolence Pt may BIPAP is contraindicated at this time. Pt ordered to transfer to SDU when bed available. Labetolol 5 mg IV given with improvement of HR to 90-100 ST with runs of SVT/Afib. Alanda Slim NP at bedside 2025 to discuss code status with spouse. After discussion Patient made full DNR per husband. Po2 sats improving to 90s% on NRB. Xopenex treatment given per RT. Per NP floor RN  to page [facility] if Patient appears uncomfortable and/or has seizures and not within prn medications already ordered. RN advised to notify myself as well for worsening changes. RRT will follow Patient tonight.

## 2016-01-03 NOTE — Progress Notes (Addendum)
Paged provider back to come assess pt at bedside and address pt's code status as pt's status is declining. Pt already given extra 2 mg of ativan totaling 4mg  iv. Called pharmacy for STAT labetalol iv for elevated hr. Will pass of to nightshift RN what plan is at this point. Brooke from Rapid response up to bedside to assist with pt care. Spoke to provider about pt's code status needing to be addressed. Pt to be changed to DNR and transfer to stepdown was cancelled.

## 2016-01-03 NOTE — Progress Notes (Signed)
Paged provider about pt having seizure and hr up to 200s with SVT. Pt to get 2mg  dose of prn ativan and an extra 2mg  dose STAT. Paged rapid response to bedside. Pt to get medication. Will pass off to night shift nurse to reassess.

## 2016-01-04 DIAGNOSIS — I10 Essential (primary) hypertension: Secondary | ICD-10-CM

## 2016-01-04 DIAGNOSIS — R739 Hyperglycemia, unspecified: Secondary | ICD-10-CM

## 2016-01-04 DIAGNOSIS — Z7189 Other specified counseling: Secondary | ICD-10-CM | POA: Insufficient documentation

## 2016-01-04 DIAGNOSIS — N183 Chronic kidney disease, stage 3 (moderate): Secondary | ICD-10-CM

## 2016-01-04 DIAGNOSIS — J690 Pneumonitis due to inhalation of food and vomit: Secondary | ICD-10-CM

## 2016-01-04 DIAGNOSIS — Z515 Encounter for palliative care: Secondary | ICD-10-CM

## 2016-01-04 DIAGNOSIS — J189 Pneumonia, unspecified organism: Secondary | ICD-10-CM

## 2016-01-04 LAB — COMPREHENSIVE METABOLIC PANEL
ALT: 10 U/L — ABNORMAL LOW (ref 14–54)
ANION GAP: 15 (ref 5–15)
AST: 16 U/L (ref 15–41)
Albumin: 2 g/dL — ABNORMAL LOW (ref 3.5–5.0)
Alkaline Phosphatase: 75 U/L (ref 38–126)
BUN: 72 mg/dL — ABNORMAL HIGH (ref 6–20)
CO2: 22 mmol/L (ref 22–32)
CREATININE: 1.47 mg/dL — AB (ref 0.44–1.00)
Calcium: 8.4 mg/dL — ABNORMAL LOW (ref 8.9–10.3)
Chloride: 116 mmol/L — ABNORMAL HIGH (ref 101–111)
GFR, EST AFRICAN AMERICAN: 42 mL/min — AB (ref >60)
GFR, EST NON AFRICAN AMERICAN: 36 mL/min — AB (ref >60)
Glucose, Bld: 121 mg/dL — ABNORMAL HIGH (ref 65–99)
POTASSIUM: 3.1 mmol/L — AB (ref 3.5–5.1)
Sodium: 153 mmol/L — ABNORMAL HIGH (ref 135–145)
Total Bilirubin: 0.5 mg/dL (ref 0.3–1.2)
Total Protein: 5.3 g/dL — ABNORMAL LOW (ref 6.5–8.1)

## 2016-01-04 LAB — CBC
HCT: 31.1 % — ABNORMAL LOW (ref 36.0–46.0)
Hemoglobin: 10 g/dL — ABNORMAL LOW (ref 12.0–15.0)
MCH: 30.9 pg (ref 26.0–34.0)
MCHC: 32.2 g/dL (ref 30.0–36.0)
MCV: 96 fL (ref 78.0–100.0)
PLATELETS: 154 10*3/uL (ref 150–400)
RBC: 3.24 MIL/uL — ABNORMAL LOW (ref 3.87–5.11)
RDW: 14.5 % (ref 11.5–15.5)
WBC: 13.5 10*3/uL — ABNORMAL HIGH (ref 4.0–10.5)

## 2016-01-04 LAB — HEMOGLOBIN A1C
Hgb A1c MFr Bld: 4.7 % — ABNORMAL LOW (ref 4.8–5.6)
Mean Plasma Glucose: 88 mg/dL

## 2016-01-04 LAB — HIV ANTIBODY (ROUTINE TESTING W REFLEX): HIV Screen 4th Generation wRfx: NONREACTIVE

## 2016-01-04 LAB — TSH: TSH: 2.378 u[IU]/mL (ref 0.350–4.500)

## 2016-01-04 MED ORDER — SODIUM CHLORIDE 0.9 % IV SOLN
1.5000 g | Freq: Three times a day (TID) | INTRAVENOUS | Status: DC
  Administered 2016-01-04 – 2016-01-06 (×6): 1.5 g via INTRAVENOUS
  Filled 2016-01-04 (×9): qty 1.5

## 2016-01-04 MED ORDER — SODIUM CHLORIDE 0.9 % IV SOLN
1250.0000 mg | INTRAVENOUS | Status: DC
  Administered 2016-01-04 – 2016-01-05 (×2): 1250 mg via INTRAVENOUS
  Filled 2016-01-04 (×3): qty 1250

## 2016-01-04 MED ORDER — SODIUM CHLORIDE 0.9 % IV SOLN
500.0000 mg | Freq: Two times a day (BID) | INTRAVENOUS | Status: DC
  Administered 2016-01-04 – 2016-01-05 (×3): 500 mg via INTRAVENOUS
  Filled 2016-01-04 (×4): qty 5

## 2016-01-04 MED ORDER — POTASSIUM CHLORIDE 10 MEQ/100ML IV SOLN
10.0000 meq | INTRAVENOUS | Status: AC
  Administered 2016-01-04 (×4): 10 meq via INTRAVENOUS
  Filled 2016-01-04 (×4): qty 100

## 2016-01-04 MED ORDER — DEXTROSE 5 % IV SOLN
INTRAVENOUS | Status: DC
  Administered 2016-01-04 – 2016-01-05 (×2): via INTRAVENOUS

## 2016-01-04 NOTE — Progress Notes (Signed)
Subjective: Patient had seizure over night. This was terminated with Ativan.  Currently resting comfortably.   Exam: Filed Vitals:   01/03/16 2016 01/04/16 0535  BP: 124/62 154/74  Pulse: 95 87  Temp:  99.4 F (37.4 C)  Resp: 19        Gen: In bed, NAD MS: resting comfortably with NRB in place [id number] midline, PERRLA, symmetric facial grimmace Motor: withdraws from pain in all 4 extremities Sensory:as above   Pertinent Labs: CBC 13.5 K 3.1 BUN 72 Cr 1.47  Etta Quill PA-C [facility] [phone]  Impression: 65 y.o. female patient who was brought in from a nursing facility after possibly multiple seizures. Has had further seizures over night.    Recommendations: 1) start Keppra 500 mg BID     01/04/2016, 10:17 AM

## 2016-01-04 NOTE — Evaluation (Signed)
Clinical/Bedside Swallow Evaluation Patient Details  Name: [name] MRN: [id number] Date of Birth: 01/08/1951  Today's Date: 01/04/2016 Time: SLP Start Time (ACUTE ONLY): 1100 SLP Stop Time (ACUTE ONLY): 1116 SLP Time Calculation (min) (ACUTE ONLY): 16 min  Past Medical History:  Past Medical History  Diagnosis Date  . Asthma   . Bronchitis   . Allergic rhinitis   . HTN (hypertension)   . Hyperlipidemia   . Obesity   . OSA (obstructive sleep apnea)     poor compliance with cpap  . CHF (congestive heart failure) ([place])   . Biventricular failure ([place])     compensated  . Psoriasis   . Stasis edema     of lower extremities  . Depression   . Situational stress   . Anemia   . Gout   . Chronic anticoagulation     off due to GIB, thigh hematoma  . Yeast infection involving the vagina and surrounding area   . Atrial fibrillation with RVR ([place])   . GERD (gastroesophageal reflux disease)   . Chronic bronchitis ([place])     "get it q yr" (08/10/2013)  . Shortness of breath     "all the time" (08/10/2013)  . On home oxygen therapy     "2L during the night and prn during the day" (08/10/2013)  . Type II diabetes mellitus ([place])   . History of blood transfusion     "few during my lifetime; last time was 4 days ago when I got 2 units" (08/10/2013)  . Stroke [facility]) ?2007    denies residuals on 08/10/2013  . DJD (degenerative joint disease)   . Bleeding     "from my skin folds; just won't stop" (08/10/2013)  . Complication of anesthesia     "they gave me too much; I stayed out of it for awhile" (08/10/2013)  . Anxiety   . Kidney stones   . Kidney disease, chronic, stage III (GFR 30-59 ml/min)   . Chronic kidney disease, stage IV (severe) ([place])   . Morbid obesity ([place])   . Diastolic CHF ([place])   . Pneumonia     "said I did on 08/03/2013 then they ruled it out" (08/10/2013)  . HCAP (healthcare-associated pneumonia) 01/03/2016   Past Surgical History:  Past Surgical History  Procedure  Laterality Date  . Appendectomy    . Cholecystectomy    . Total knee arthroplasty Left ~ 2006  . Total abdominal hysterectomy    . Back surgery    . Cystectomy Left     hand  . Tonsillectomy    . Joint replacement    . Lumbar disc surgery    . Colonoscopy N/A 08/16/2013    Procedure: COLONOSCOPY;  Surgeon: Beryle Beams, MD;  Location: [facility];  Service: Endoscopy;  Laterality: N/A;  . Hematoma evacuation Right 09/10/2013    Procedure: EVACUATION OF RIGHT LEG HEMATOMA AND EXCISION [place] RIGHT LEG ;  Surgeon: Merrie Roof, MD;  Location: [facility];  Service: General;  Laterality: Right;  . Debridement and closure wound Right 09/22/2013    Procedure: IRRIGATION AND DEBRIDEMENT SURGICAL PREP PLACEMENT OF [place]  ;  Surgeon: Irene Limbo, MD;  Location: [facility];  Service: Plastics;  Laterality: Right;  . Skin split graft Right 10/07/2013    Procedure: SPLIT THICKNESS SKIN GRAFT RIGHT THIGH TO RIGHT LEG, PLACEMENT OF A CEL AND WOUND VAC TO RIGHT THIGH  ;  Surgeon: Irene Limbo, MD;  Location: [facility];  Service: Plastics;  Laterality: Right;   HPI:  65 y.o. female who presents from SNF with seizure activity. PMH significant for GERD, SOB, DM, PNA, CVA. Husband at bedside denies any h/o swallowing PTA.   Assessment / Plan / Recommendation Clinical Impression  Pt is lethargic and minimally communicative. She mouths "bye" upon therapist departure, but no phonation heard. She does open her mouth to accept ice chips and small amounts of water via straw. Multiple swallows and immediate coughing follows. Given current mentation, respiratory needs, and overt signs of difficulty, recommend to remain NPO for now. If pt is alert and accepting, she may have a couple of ice chips after oral care only. SLP to return to assess readiness.     Aspiration Risk  Severe aspiration risk    Diet Recommendation Ice chips PRN after oral care (ONLY if alert and accepting)   Medication  Administration: Via alternative means    Other  Recommendations Oral Care Recommendations: Oral care QID   Follow up Recommendations  Skilled Nursing facility    Frequency and Duration min 2x/week  2 weeks       Prognosis Prognosis for Safe Diet Advancement: Fair Barriers to Reach Goals: Other (Comment) (overall prognosis)      Swallow Study   General HPI: 65 y.o. female who presents from SNF with seizure activity. PMH significant for GERD, SOB, DM, PNA, CVA. Husband at bedside denies any h/o swallowing PTA. Type of Study: Bedside Swallow Evaluation Previous Swallow Assessment: none in chart Diet Prior to this Study: NPO Temperature Spikes Noted: Yes (100.1) Respiratory Status: Venti-mask History of Recent Intubation: No Behavior/Cognition: Lethargic/Drowsy;Requires cueing Oral Cavity Assessment: Dried secretions Self-Feeding Abilities: Total assist Patient Positioning: Upright in bed Baseline Vocal Quality: Not observed    Oral/Motor/Sensory Function Overall Oral Motor/Sensory Function: Generalized oral weakness   Ice Chips Ice chips: Impaired Presentation: Spoon Pharyngeal Phase Impairments: Suspected delayed Swallow;Multiple swallows   Thin Liquid Thin Liquid: Impaired Presentation: Straw Pharyngeal  Phase Impairments: Suspected delayed Swallow;Cough - Immediate    Nectar Thick Nectar Thick Liquid: Not tested   Honey Thick Honey Thick Liquid: Not tested   Puree Puree: Not tested   Solid   GO   Solid: Not tested       Germain Osgood, M.A. CCC-SLP [phone]  Germain Osgood 01/04/2016,11:32 AM

## 2016-01-04 NOTE — NC FL2 (Signed)
[place] LEVEL OF CARE SCREENING TOOL     IDENTIFICATION  Patient Name: [name] Birthdate: 01-16-1951 Sex: female Admission Date (Current Location): 01/03/2016  [facility] and [place] Number:  Herbalist and Address:  [facility], [place] [address], [place], [place] [address]      Provider Number: [id number]  Attending Physician Name and Address:  Thurnell Lose, MD  Relative Name and Phone Number:  Secundino Ginger spouse [phone]    Current Level of Care: Hospital Recommended Level of Care: [place] Prior Approval Number:    Date Approved/Denied:   PASRR Number:    Discharge Plan: SNF    Current Diagnoses: Patient Active Problem List   Diagnosis Date Noted  . Goals of care, counseling/discussion   . HCAP (healthcare-associated pneumonia) 01/03/2016  . Seizure ([place]) 01/03/2016  . Aspiration pneumonia ([place]) 01/03/2016  . Essential hypertension 01/03/2016  . End of life care 01/03/2016  . Hyperglycemia 01/03/2016  . CKD (chronic kidney disease) 01/03/2016  . Immobility 12/14/2014  . Chronic airway obstruction, not elsewhere classified 09/07/2014  . Anemia in chronic kidney disease(285.21) 08/31/2014  . Candidiasis of skin 06/23/2014  . Congestive heart disease ([place]) 06/21/2014  . Type 2 diabetes mellitus with other diabetic kidney complication ([place]) 123XX123  . Insomnia 05/16/2014  . Gastroparesis 04/22/2014  . Anxiety state, unspecified 04/22/2014  . Obesity hypoventilation syndrome ([place]) 02/05/2014  . Chronic diastolic CHF (congestive heart failure) ([place]) 02/05/2014  . Acute systolic heart failure ([place]) 01/26/2014  . Hypernatremia 12/10/2013  . A-fib ([place]) 12/03/2013  . Chronic anemia 11/17/2013  . Gout 11/17/2013  . Obesity 11/17/2013  . OSA (obstructive sleep apnea) 11/17/2013  . Nausea alone 11/14/2013  . Wound of right leg 11/14/2013  . Unspecified constipation 11/12/2013  . Acute  respiratory failure ([place]) 11/10/2013  . CHF (congestive heart failure) ([place]) 11/09/2013  . SOB (shortness of breath) 10/19/2013  . 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 Hematoma complicating a procedure 10/15/2013  . Bleeding into the skin 08/10/2013  . Bright red rectal bleeding 08/10/2013  . PAF (paroxysmal atrial fibrillation) ([place]) 07/05/2013  . Psoriasis 07/05/2013  . Chronic combined systolic and diastolic CHF (congestive heart failure) ([place]) 07/05/2013  . CKD (chronic kidney disease), stage IV ([place]) 08/15/2012  . Anemia of chronic disease 08/15/2012  . DM (diabetes mellitus), type 2 with renal complications ([place]) [id number]  . ATRIAL FIBRILLATION WITH RAPID VENTRICULAR RESPONSE 10/17/2010  . Acute combined systolic and diastolic heart failure ([place]) 10/17/2010  . HYPERLIPIDEMIA 03/18/2010  . HYPERTENSION 03/18/2010  . C V A / STROKE 03/18/2010  . OBSTRUCTIVE SLEEP APNEA 03/15/2010    Orientation RESPIRATION BLADDER Height & Weight     (Disoriented)  O2 Incontinent 5\' 3"  (160 cm) 223 lbs.  BEHAVIORAL SYMPTOMS/MOOD NEUROLOGICAL BOWEL NUTRITION STATUS      Continent  (Please see DC summary)  AMBULATORY STATUS COMMUNICATION OF NEEDS Skin   Extensive Assist Non-Verbally Normal                       Personal Care Assistance Level of Assistance  Bathing, Feeding, Dressing Bathing Assistance: Maximum assistance Feeding assistance: Maximum assistance Dressing Assistance: Maximum assistance     Functional Limitations Info             SPECIAL CARE FACTORS FREQUENCY  Contractures      Additional Factors Info  Code Status, Allergies Code Status Info: DNR Allergies Info: Daptomycin, Morphine And Related, Cephalexin, Tramadol, Atenolol, Erythromycin, Celecoxib, Codeine, Fluoxetine Hcl, Latex, Ofloxacin, Penicillins, Rofecoxib, Sulfonamide Derivatives           Current Medications (01/04/2016):  This is the current  hospital active medication list Current Facility-Administered Medications  Medication Dose Route Frequency Provider Last Rate Last Dose  . ampicillin-sulbactam (UNASYN) 1.5 g in sodium chloride 0.9 % 50 mL IVPB  1.5 g Intravenous Q8H Lauren D Bajbus, RPH   1.5 g at 01/04/16 1225  . dextrose 5 % solution   Intravenous Continuous Thurnell Lose, MD 110 mL/hr at 01/04/16 1134    . furosemide (LASIX) injection 40 mg  40 mg Intravenous BID Waldemar Dickens, MD   40 mg at 01/04/16 0818  . heparin injection 5,000 Units  5,000 Units Subcutaneous 3 times per day Waldemar Dickens, MD   5,000 Units at 01/04/16 0535  . hydrALAZINE (APRESOLINE) injection 5-10 mg  5-10 mg Intravenous Q4H PRN Waldemar Dickens, MD      . labetalol (NORMODYNE,TRANDATE) injection 5 mg  5 mg Intravenous Q2H PRN Waldemar Dickens, MD   5 mg at 01/03/16 2008  . levETIRAcetam (KEPPRA) 500 mg in sodium chloride 0.9 % 100 mL IVPB  500 mg Intravenous Q12H Marliss Coots, PA-C   500 mg at 01/04/16 1133  . LORazepam (ATIVAN) injection 2 mg  2 mg Intravenous Q4H PRN Waldemar Dickens, MD   2 mg at 01/03/16 1941  . ondansetron (ZOFRAN) injection 4 mg  4 mg Intravenous Q6H PRN Waldemar Dickens, MD   4 mg at 01/03/16 1430  . pantoprazole (PROTONIX) injection 40 mg  40 mg Intravenous Q24H Waldemar Dickens, MD   40 mg at 01/04/16 1439  . potassium chloride 10 mEq in 100 mL IVPB  10 mEq Intravenous Q1 Hr x 4 Thurnell Lose, MD   10 mEq at 01/04/16 1439  . sodium chloride flush (NS) 0.9 % injection 3 mL  3 mL Intravenous Q12H Waldemar Dickens, MD   3 mL at 01/03/16 2200  . vancomycin (VANCOCIN) 1,250 mg in sodium chloride 0.9 % 250 mL IVPB  1,250 mg Intravenous Q24H Lauren D Bajbus, RPH   1,250 mg at 01/04/16 1312     Discharge Medications: Please see discharge summary for a list of discharge medications.  Relevant Imaging Results:  Relevant Lab Results:   Additional [place] Jadee Golebiewski, LCSWA

## 2016-01-04 NOTE — Care Management Note (Signed)
Case Management Note  Patient Details  Name: [name] MRN: [id number] Date of Birth: 10/30/1951  Subjective/Objective:                  Patient admitted from [facility], long term care. Patient with PNA and seizures. Palliative consult pending. Changed to DNR this admission.   Action/Plan:  Anticipate DC to SNF w/ palliative VS Residential Hospice.  Expected Discharge Date:                  Expected Discharge Plan:  Skilled Nursing Facility  In-House Referral:  Clinical Social Work  Discharge planning Services  CM Consult  Post Acute Care Choice:    Choice offered to:     DME Arranged:    DME Agency:     HH Arranged:    [place] Agency:     Status of Service:  In process, will continue to follow  Medicare Important Message Given:    Date Medicare IM Given:    Medicare IM give by:    Date Additional Medicare IM Given:    Additional Medicare Important Message give by:     If discussed at [place] of Stay Meetings, dates discussed:    Additional Comments:  Carles Collet, RN 01/04/2016, 1:47 PM

## 2016-01-04 NOTE — Progress Notes (Signed)
Nutrition Brief Note  Chart reviewed. Pt likely transitioning to comfort care. PCT consult has been placed and plan to discuss [place] and end of life issues. No further nutrition interventions warranted at this time.  Please re-consult as needed.   Maritsa Hunsucker A. Jimmye Norman, RD, LDN, CDE Pager: [phone] After hours Pager: [phone]

## 2016-01-04 NOTE — Progress Notes (Signed)
ANTIBIOTIC CONSULT NOTE - INITIAL  Pharmacy Consult for Unasyn, vancomycin Indication: rule out sepsis  Allergies  Allergen Reactions  . Daptomycin Other (See Comments)    Elevated CPK  . Morphine And Related Nausea And Vomiting  . Cephalexin Rash  . Tramadol Nausea And Vomiting  . Atenolol     unknown  . Erythromycin     unknown  . Celecoxib Other (See Comments)    Unknown, can use furosemide without issue  . Codeine Other (See Comments)    unknown  . Fluoxetine Hcl Other (See Comments)    unknown  . Latex Other (See Comments)  . Ofloxacin Other (See Comments)    unknown  . Penicillins Other (See Comments)    Unknown  Has patient had a PCN reaction causing immediate rash, facial/tongue/throat swelling, SOB or lightheadedness with hypotension: unknown Has patient had a PCN reaction causing severe rash involving mucus membranes or skin necrosis: unknown Has patient had a PCN reaction that required hospitalization unknown Has patient had a PCN reaction occurring within the last 10 years: unknown If all of the above answers are "NO", then may proceed with Cephalosporin use.  [name] Rofecoxib Other (See Comments)    unknown  . Sulfonamide Derivatives Other (See Comments)    unknown    Patient Measurements: Height: 5' 2.99" (160 cm) Weight: 223 lb 1.7 oz (101.2 kg) IBW/kg (Calculated) : 52.38   Vital Signs: Temp: 99.4 F (37.4 C) (01/26 0535) Temp Source: Oral (01/26 0535) BP: 154/74 mmHg (01/26 0535) Pulse Rate: 87 (01/26 0535) Intake/Output from previous day: 01/25 0701 - 01/26 0700 In: 1700 [I.V.:1700] Out: -  Intake/Output from this shift:    Labs:  Recent Labs  01/03/16 0935 01/03/16 0941 01/04/16 0551  WBC 15.5*  --  13.5*  HGB 11.1* 13.6 10.0*  PLT 256  --  154  CREATININE 1.94* 1.60* 1.47*   Estimated Creatinine Clearance: 43.3 mL/min (by C-G formula based on Cr of 1.47). No results for input(s): VANCOTROUGH, VANCOPEAK, VANCORANDOM, GENTTROUGH,  GENTPEAK, GENTRANDOM, TOBRATROUGH, TOBRAPEAK, TOBRARND, AMIKACINPEAK, AMIKACINTROU, AMIKACIN in the last 72 hours.   Microbiology: Recent Results (from the past 720 hour(s))  Urine culture     Status: None (Preliminary result)   Collection Time: 01/03/16  9:45 AM  Result Value Ref Range Status   Specimen Description URINE, CATHETERIZED  Final   Special Requests NONE  Final   Culture 70,000 COLONIES/ml GRAM NEGATIVE RODS  Final   Report Status PENDING  Incomplete  Blood culture (routine x 2)     Status: None (Preliminary result)   Collection Time: 01/03/16 10:50 AM  Result Value Ref Range Status   Specimen Description BLOOD LEFT ANTECUBITAL  Final   Special Requests BOTTLES DRAWN AEROBIC AND ANAEROBIC 5CC  Final   Culture  Setup Time   Final    GRAM POSITIVE COCCI IN BOTH AEROBIC AND ANAEROBIC BOTTLES CRITICAL RESULT CALLED TO, READ BACK BY AND VERIFIED WITH: KBROWN,RN @0431  01/04/16 MKELLY    Culture TOO YOUNG TO READ  Final   Report Status PENDING  Incomplete  MRSA PCR Screening     Status: None   Collection Time: 01/03/16  6:02 PM  Result Value Ref Range Status   MRSA by PCR NEGATIVE NEGATIVE Final    Comment:        The GeneXpert MRSA Assay (FDA approved for NASAL specimens only), is one component of a comprehensive MRSA colonization surveillance program. It is not intended to diagnose MRSA infection nor to  guide or monitor treatment for MRSA infections.    Assessment: 61 YOF brought in from faciility with new onset seizures and possible sepsis. She has been started on IV Keppra.  WBC 13.5, tmax/24h 100.1. Lactic acid down to 3.7  Unasyn 1/26>> Primaxin 1/25>>1/26 Azithro 1/26 x1 Vanc 1/25>> Aztreo 1/25 x 1 Levaquin 1/25 x 1  1/25 BCx: 1/2 GPC 1/25 urine: 70K GNR 1/25 MRSA PCR: neg  SCr 1.47, eCrCl ~40-30mL/min.   Goal of Therapy:  Vancomycin trough level 15-20 mcg/ml  Plan:  -Unasyn 1.5g IV q8h -vancomycin 1250mg  IV q24h -follow c/s, clinical  progression, renal function, trough at [place] D. Bhavesh Vazquez, PharmD, BCPS Clinical Pharmacist Pager: [phone] 01/04/2016 10:55 AM

## 2016-01-04 NOTE — Consult Note (Signed)
Consultation Note Date: 01/04/2016   Patient Name: [name]  DOB: June 04, 1951  MRN: [id number]  Age / Sex: 65 y.o., female  PCP: Seward Carol, MD Referring Physician: Thurnell Lose, MD  Reason for Consultation: Establishing goals of care    Clinical Assessment/Narrative:   Ms. Swor is a 65 yo lady who has been in a nursing home for the past few years. The patient has a past medical history significant for A fib not on anti coagulation, CVA, CHF. The patient's son is the primary historian present at the bedside who states that he was called by the staff at [facility] early in am on 01-03-16 that the patient began having seizures.  2 doses of Ativan were given at the nursing home with out any benefit. There was considerable discussion between patient's husband and sons who were debating on allowing patient to stay at the nursing home and continued to seize and/or pass versus bringing her to the emergency room for treatment. The decision was eventually made to bring patient to the emergency room. An additional dose of Ativan was given by EMS and patient was brought to [facility] emergency room at which time patient stopped seizing at time of arrival at approximately 9 AM. Patient's baseline is such that she is a long term resident at nursing home due to multiple physical/musculoskeletal conditions and physical deconditioning.  The patient has been seen by Neurology started on Keppra, the patient is admitted to the hospitalist service. Hospital course is significant for aspiration PNA and possible UTI. She remains on NPO, aspiration precautions, antibiotics. She has dehydration, hypernatremia, AKI on stage III CKD on D5W. Additionally, blood cultures are also positive, she is on Vancomycin.     Palliative care consulted for goals of care discussions. The patient is non verbal, she is on a NRB mask,  husband not at the bedside, one of her sons is the primary historian. The patient has had ongoing physical decline and has been in nursing home for the past several years now.   Discussed with patient's son about the patient's acute conditions warranting her hospitalization at this time such as seizures ( no history of seizures in the past) and infection:  possibly UTI, aspiration, possible + blood cultures.    Contacts/Participants in Discussion: Primary Decision Maker:    Patient, then her husband Relationship to Patient  Has spouse, 2 sons.  HCPOA: yes     SUMMARY OF RECOMMENDATIONS: DNR DNI.  Continue current scope of medical treatment: anti seizure medications, antibiotics, IVF.  Discussed with son about the patient's acute and chronic underlying conditions and that it may warrant addition of hospice services as an extra layer of support at her facility at the time of discharge. Follow disease trajectory for now.    Code Status/Advance Care Planning: DNR    Code Status Orders        Start     Ordered   01/03/16 2039  Do not attempt resuscitation (DNR)   Continuous    Question Answer Comment  In the event of cardiac or respiratory ARREST Do not call a "code blue"   In the event of cardiac or respiratory ARREST Do not perform Intubation, CPR, defibrillation or ACLS   In the event of cardiac or respiratory ARREST Use medication by any route, position, wound care, and other measures to relive pain and suffering. May use oxygen, suction and manual treatment of airway obstruction as needed for comfort.  01/03/16 2038    Code Status History    Date Active Date Inactive Code Status Order ID Comments User Context   01/03/2016  1:12 PM 01/03/2016  8:38 PM Partial Code [id number]  Waldemar Dickens, MD ED   01/25/2014 10:31 PM 01/28/2014  7:40 PM Full Code [id number]  Etta Quill, DO ED   12/03/2013  2:20 PM 12/16/2013  4:36 PM Full Code [id number]  Flonnie Overman Dhungel, MD Inpatient    11/17/2013  4:12 PM 11/22/2013 10:12 PM Full Code [id number]  Delfina Redwood, MD Inpatient   11/09/2013  8:15 PM 11/16/2013  9:46 PM Full Code [id number]  Domenic Polite, MD Inpatient   10/13/2013  8:14 PM 10/21/2013  4:02 PM Full Code [id number]  Allie Bossier, MD Inpatient   09/09/2013  4:27 AM 09/22/2013  7:43 PM Full Code [id number]  Phillips Grout, MD Inpatient   08/10/2013  2:38 PM 08/19/2013  5:30 PM Full Code [id number]  Louellen Molder, MD Inpatient   08/03/2013  5:19 PM 08/08/2013  5:46 PM Full Code [id number]  Modena Jansky, MD Inpatient   07/05/2013 10:58 PM 07/07/2013  5:55 PM Full Code [id number]  Delfina Redwood, MD Inpatient   02/11/2013  5:39 AM 02/18/2013  5:45 PM Full Code [id number]  Etta Quill, DO ED   08/15/2012  5:45 PM 08/29/2012  4:38 PM Full Code [id number]  Hervey Ard, RN Inpatient   01/21/2012 11:06 PM 02/05/2012 10:46 PM Full Code [id number]  Bronson Curb, RN Inpatient   11/23/2011  4:04 AM 11/29/2011  6:59 PM Full Code [id number]  Cecilie Lowers, RN Inpatient    Advance Directive Documentation        Most Recent Value   Type of Advance Directive  Out of facility DNR (pink MOST or yellow form)   Pre-existing out of facility DNR order (yellow form or pink MOST form)  Yellow form placed in chart (order not valid for inpatient use)   "MOST" Form in Place?        Other Directives:Other  Symptom Management:    as above  Palliative Prophylaxis:   Delirium Protocol  Additional Recommendations (Limitations, Scope, Preferences):  Continue current hospitalization, patient with ongoing decline at the nursing home, follow disease trajectory to determine hospice eligibility at time of discharge.    Psycho-social/Spiritual:  Support System: [place] Desire for further Chaplaincy support:no Additional Recommendations: Education on Hospice  Prognosis:  Guarded, patient with acute illness, but also with various underlying conditions, ongoing sub acute to chronic decline at  the nursing home.   Discharge Planning:  Likely back to skilled [facility] with Hospice  Chief Complaint/ Primary Diagnoses: Present on Admission:  . HCAP (healthcare-associated pneumonia) . A-fib [facility])  I have reviewed the medical record, interviewed the patient and family, and examined the patient. The following aspects are pertinent.  Past Medical History  Diagnosis Date  . Asthma   . Bronchitis   . Allergic rhinitis   . HTN (hypertension)   . Hyperlipidemia   . Obesity   . OSA (obstructive sleep apnea)     poor compliance with cpap  . CHF (congestive heart failure) ([place])   . Biventricular failure ([place])     compensated  . Psoriasis   . Stasis edema     of lower extremities  . Depression   . Situational stress   . Anemia   . Gout   . Chronic anticoagulation     off due to GIB,  thigh hematoma  . Yeast infection involving the vagina and surrounding area   . Atrial fibrillation with RVR ([place])   . GERD (gastroesophageal reflux disease)   . Chronic bronchitis ([place])     "get it q yr" (08/10/2013)  . Shortness of breath     "all the time" (08/10/2013)  . On home oxygen therapy     "2L during the night and prn during the day" (08/10/2013)  . Type II diabetes mellitus ([place])   . History of blood transfusion     "few during my lifetime; last time was 4 days ago when I got 2 units" (08/10/2013)  . Stroke [facility]) ?2007    denies residuals on 08/10/2013  . DJD (degenerative joint disease)   . Bleeding     "from my skin folds; just won't stop" (08/10/2013)  . Complication of anesthesia     "they gave me too much; I stayed out of it for awhile" (08/10/2013)  . Anxiety   . Kidney stones   . Kidney disease, chronic, stage III (GFR 30-59 ml/min)   . Chronic kidney disease, stage IV (severe) ([place])   . Morbid obesity ([place])   . Diastolic CHF ([place])   . Pneumonia     "said I did on 08/03/2013 then they ruled it out" (08/10/2013)  . HCAP (healthcare-associated pneumonia) 01/03/2016   Social  History   Social History  . Marital Status: Married    Spouse Name: N/A  . Number of Children: N/A  . Years of Education: N/A   Occupational History  . disabled    Social History Main Topics  . Smoking status: Former Smoker -- 0.12 packs/day for 12 years    Types: Cigarettes    Quit date: 07/05/1982  . Smokeless tobacco: Former Systems developer     Comment: smoked for 1.5 yrs about 2 cigs a day ,only if stressed  . Alcohol Use: No  . Drug Use: No  . Sexual Activity: No   Other Topics Concern  . None   Social History Narrative   Family History  Problem Relation Age of Onset  . Heart attack Father   . Asthma Father   . Heart disease Paternal Uncle   . Rectal cancer Paternal Aunt   . Other Mother     mva   Scheduled Meds: . ampicillin-sulbactam (UNASYN) IV  1.5 g Intravenous Q8H  . furosemide  40 mg Intravenous BID  . heparin  5,000 Units Subcutaneous 3 times per day  . levETIRAcetam  500 mg Intravenous Q12H  . pantoprazole (PROTONIX) IV  40 mg Intravenous Q24H  . potassium chloride  10 mEq Intravenous Q1 Hr x 4  . sodium chloride flush  3 mL Intravenous Q12H  . vancomycin  1,250 mg Intravenous Q24H   Continuous Infusions: . dextrose 110 mL/hr at 01/04/16 1134   PRN Meds:.hydrALAZINE, labetalol, LORazepam, [DISCONTINUED] ondansetron **OR** ondansetron (ZOFRAN) IV Medications Prior to Admission:  Prior to Admission medications   Medication Sig Start Date End Date Taking? Authorizing Provider  acetaminophen (TYLENOL) 500 MG tablet Take 500 mg by mouth every 6 (six) hours.   Yes Historical Provider, MD  albuterol (PROVENTIL) (2.5 MG/3ML) 0.083% nebulizer solution Take 2.5 mg by nebulization every 6 (six) hours as needed for wheezing or shortness of breath.   Yes Historical Provider, MD  allopurinol (ZYLOPRIM) 100 MG tablet Take 100 mg by mouth daily. Patient takes at 0800am daily 09/16/11  Yes Historical Provider, MD  amLODipine (NORVASC) 10 MG tablet Take  10 mg by mouth at  bedtime.  08/19/13  Yes Thurnell Lose, MD  atorvastatin (LIPITOR) 20 MG tablet Take 20 mg by mouth at bedtime. Patient takes at 2100   Yes Historical Provider, MD  cyanocobalamin (,VITAMIN B-12,) 1000 MCG/ML injection Inject 1,000 mcg into the muscle every 30 (thirty) days.   Yes Historical Provider, MD  feeding supplement (BOOST / RESOURCE BREEZE) LIQD Take 1 Container by mouth 2 (two) times daily between meals.   Yes Historical Provider, MD  ferrous sulfate 325 (65 FE) MG tablet Take 325 mg by mouth 3 (three) times daily with meals.   Yes Historical Provider, MD  hydrALAZINE (APRESOLINE) 100 MG tablet Take 100 mg by mouth 3 (three) times daily.   Yes Historical Provider, MD  hydrALAZINE (APRESOLINE) 25 MG tablet Take 5 tablets (125 mg total) by mouth 3 (three) times daily. Patient taking differently: Take 25 mg by mouth 3 (three) times daily.  12/15/13  Yes Nita Sells, MD  isosorbide dinitrate (ISORDIL) 30 MG tablet Take 1 tablet (30 mg total) by mouth 2 (two) times daily. 01/28/14  Yes Charlynne Cousins, MD  metoCLOPramide (REGLAN) 5 MG tablet Take 5 mg by mouth 3 (three) times daily.   Yes Historical Provider, MD  metoprolol tartrate (LOPRESSOR) 25 MG tablet Take 6.25 mg by mouth 2 (two) times daily.    Yes Historical Provider, MD  omeprazole (PRILOSEC) 20 MG capsule Take 20 mg by mouth 2 (two) times daily before a meal.    Yes Historical Provider, MD  oxycodone (OXY-IR) 5 MG capsule Take 5 mg by mouth every 4 (four) hours as needed for pain.   Yes Historical Provider, MD  OXYGEN Inhale 2 L into the lungs continuous.   Yes Historical Provider, MD  Polyethyl Glycol-Propyl Glycol (SYSTANE) 0.4-0.3 % SOLN Apply 1 drop to eye 4 (four) times daily.   Yes Historical Provider, MD  polyethylene glycol (MIRALAX / GLYCOLAX) packet Take 17 g by mouth 2 (two) times daily.   Yes Historical Provider, MD  torsemide (DEMADEX) 20 MG tablet Take 40 mg by mouth daily.    Yes Historical Provider, MD    traZODone (DESYREL) 50 MG tablet Take 50 mg by mouth at bedtime.    Yes Historical Provider, MD  Vitamin D, Ergocalciferol, (DRISDOL) 50000 units CAPS capsule Take 50,000 Units by mouth 3 (three) times a week.   Yes Historical Provider, MD   Allergies  Allergen Reactions  . Daptomycin Other (See Comments)    Elevated CPK  . Morphine And Related Nausea And Vomiting  . Cephalexin Rash  . Tramadol Nausea And Vomiting  . Atenolol     unknown  . Erythromycin     unknown  . Celecoxib Other (See Comments)    Unknown, can use furosemide without issue  . Codeine Other (See Comments)    unknown  . Fluoxetine Hcl Other (See Comments)    unknown  . Latex Other (See Comments)  . Ofloxacin Other (See Comments)    unknown  . Penicillins Other (See Comments)    Unknown  Has patient had a PCN reaction causing immediate rash, facial/tongue/throat swelling, SOB or lightheadedness with hypotension: unknown Has patient had a PCN reaction causing severe rash involving mucus membranes or skin necrosis: unknown Has patient had a PCN reaction that required hospitalization unknown Has patient had a PCN reaction occurring within the last 10 years: unknown If all of the above answers are "NO", then may proceed with Cephalosporin use.  [name]  Rofecoxib Other (See Comments)    unknown  . Sulfonamide Derivatives Other (See Comments)    unknown    Review of Systems Non verbal this morning, on NRB mask  Physical Exam Elderly appearing lady, on NRB mask Appears weak chronically ill Diminished breath sounds S1 S2 Abdomen soft Trace edema Opens eyes, on NRB mask.   Vital Signs: BP 154/74 mmHg  Pulse 87  Temp(Src) 99.4 F (37.4 C) (Oral)  Resp 19  Ht 5' 2.99" (1.6 m)  Wt 101.2 kg (223 lb 1.7 oz)  BMI 39.53 kg/m2  SpO2 100%  SpO2: SpO2: 100 % O2 Device:SpO2: 100 % O2 Flow Rate: .O2 Flow Rate (L/min): 10 L/min (45%)  IO: Intake/output summary:  Intake/Output Summary (Last 24 hours) at 01/04/16  1259 Last data filed at 01/04/16 0900  Gross per 24 hour  Intake    500 ml  Output      0 ml  Net    500 ml    LBM: Last BM Date:  (PTA) Baseline Weight: Weight: 101.2 kg (223 lb 1.7 oz) Most recent weight: Weight: 101.2 kg (223 lb 1.7 oz)      Palliative Assessment/Data:  Flowsheet Rows        Most Recent Value   Intake Tab    Referral Department  Hospitalist   Unit at Time of Referral  Med/Surg Unit   Palliative Care Primary Diagnosis  Sepsis/Infectious Disease   Date Notified  01/03/16   Palliative Care Type  New Palliative care   Reason for referral  Clarify Goals of Care   Date of Admission  01/03/16   Date first seen by Palliative Care  01/04/16   # of days Palliative referral response time  1 Day(s)   # of days IP prior to Palliative referral  0   Clinical Assessment    Palliative Performance Scale Score  30%   Pain Max last 24 hours  5   Pain Min Last 24 hours  4   Dyspnea Max Last 24 Hours  4   Dyspnea Min Last 24 hours  3   Psychosocial & Spiritual Assessment    Palliative Care Outcomes    Patient/Family meeting held?  Yes   Who was at the meeting?  patient's son   Palliative Care Outcomes  Clarified goals of care, Counseled regarding hospice   Palliative Care follow-up planned  Yes, Facility      Additional Data Reviewed:  CBC:    Component Value Date/Time   WBC 13.5* 01/04/2016 0551   HGB 10.0* 01/04/2016 0551   HCT 31.1* 01/04/2016 0551   PLT 154 01/04/2016 0551   MCV 96.0 01/04/2016 0551   NEUTROABS 13.8* 01/03/2016 0935   LYMPHSABS 1.3 01/03/2016 0935   MONOABS 0.4 01/03/2016 0935   EOSABS 0.0 01/03/2016 0935   BASOSABS 0.0 01/03/2016 0935   Comprehensive Metabolic Panel:    Component Value Date/Time   NA 153* 01/04/2016 0551   K 3.1* 01/04/2016 0551   CL 116* 01/04/2016 0551   CO2 22 01/04/2016 0551   BUN 72* 01/04/2016 0551   CREATININE 1.47* 01/04/2016 0551   CREATININE 2.65* 07/01/2013 1108   GLUCOSE 121* 01/04/2016 0551   CALCIUM  8.4* 01/04/2016 0551   AST 16 01/04/2016 0551   ALT 10* 01/04/2016 0551   ALKPHOS 75 01/04/2016 0551   BILITOT 0.5 01/04/2016 0551   PROT 5.3* 01/04/2016 0551   ALBUMIN 2.0* 01/04/2016 0551     Time In: 11 Time Out: 12  Time Total: 60 Greater than 50%  of this time was spent counseling and coordinating care related to the above assessment and plan.  Signed by: Loistine Chance, MD [id number] Loistine Chance, MD  01/04/2016, 12:59 PM  Please contact Palliative Medicine Team phone at [phone] for questions and concerns.

## 2016-01-04 NOTE — Progress Notes (Signed)
Paged MD on call in regards to Gram + Cocci in both anaerobic and aerobic blood culture bottles

## 2016-01-04 NOTE — Progress Notes (Signed)
Patient 100% sp02 on NRB. Patient placed back on venti mask- rechecked o2 sats 100%

## 2016-01-04 NOTE — Progress Notes (Signed)
Shift event note:  Notified by RN at approx 1920 that pt w/ active seizure activity s/p Ativan 2 mg IV. Hr noted to be near 200 and 02 sats dropped to 70's. Poor respiratory noted so pt placed on NRP at 100% and 02 sats improved. Orders placed to repeat Ativan 2 mg IV. Spoke w/ RR RN upon her arrival to bedside and she feels pt needs transfer to higher level of care d/t persistent seizure acitivity, increased HR and poor respiratory effort. Currently pt's spouse is requesting partial code status (no intubation). At bedside pt noted resting in NAD w/ NRB in place and 02 sats of > 90%. Pt will partially open eyes at times but is nonverbal. HR has improved to < 90's. Spouse "Larrie" at bedside. He relates he and his wife have been together 3 years and have grand and great grand children together. Had extensive discussion regarding pt's code status. Initially pt's he reported he did want chest compressions in the event of cardiac arrest but no intubation. He expressed his desire to make sure his wife is comfortable above all else. He is cognizant of the fact that he feels pt "has fought her battle for five years" and feels she is " nearing the end. Once the specifics of CPR were discussed at length and how this would likely relate to further suffering and no improved quality of life he decided to make the pt a DNR. He would like to continue current treatments for now. Reviewed that a consult to the palliative care team has been placed and that they would be attempting to schedule a meeting to discuss GOC/EOL issues in detail and he is agreeable to this. At the time of me departure pt was resting in NAD. NRB remains in place and VS have returned to baseline. There does not appear to be any overt seizure activity. Assessment/Plan: 1. Persistent seizures: New onset. Ct head w/o acute findings. Stat EEG findings c/w acute encephalopathy. Present on admission. Continue PRN Ativan for seizure acitvity. Neurology following.   2. Code status: Pt is now DNR. Will cancel transfer to SDU. Will continue to monitor closely on telemetry.  Jeryl Columbia, Np-C [facility] Pager [phone]

## 2016-01-04 NOTE — Progress Notes (Signed)
Patient Demographics:    [name], is a 65 y.o. female, DOB - 1951-09-19, [id number]  Admit date - 01/03/2016   Admitting Physician Waldemar Dickens, MD  Outpatient Primary MD for the patient is Kandice Hams, MD  LOS - 1   Chief Complaint  Patient presents with  . Seizures        Subjective:    [employer] today has, No headache, No chest pain, No abdominal pain - No Nausea, No new weakness tingling or numbness, No Cough - SOB.     Assessment  & Plan :     1. Aspiration pneumonia in a healthcare setting. Monitor cultures, aspiration precautions, nothing by mouth telemetry vital to by speech, for now will titrate down to Unasyn and monitor. Supportive care.   2. Severe dehydration with hypernatremia, ARF on CK D3. Switch IV fluids to D5W, monitor electrolytes. Baseline creatinine close to 1.6. Improved.   3. 1 out of 2 blood culture positive for gram-positive cocci. Cover with vancomycin until culture finalizes, could be contamination.   4. Possible UTI. Specimen poor quality. Would monitor with above antibiotics which are for now and can Unasyn.   5. New onset seizures. Head CT nonacute, could be due to metabolic encephalopathy, neuro on board, placed on Keppra.   6. Deconditioning, chronically bedbound, poor quality of life. Family wants gentle medical treatment. If declines full comfort care. Will involve palliative care as well.   7.Chr.Diastolic Ef [id number] - dehydrated on D5W.   8.PAfib: no anticoagulation per family. Initially in RVR. CHADSVASC score 6, - continue bblocker.    9.HTN - when necessary hydralazine.   10. Questionable diabetes mellitus. Doubt diagnosis, A1c 4.7.    Code Status : DO NOT RESUSCITATE  Family Communication  : Husband  bedside  Disposition Plan  : SNF in 2-3 days  Consults  :   Pall care, Neuro  Procedures  :   CT head nonacute  DVT Prophylaxis  :  Heparin   Lab Results  Component Value Date   PLT 154 01/04/2016    Inpatient Medications  Scheduled Meds: . azithromycin  500 mg Intravenous Q24H  . furosemide  40 mg Intravenous BID  . heparin  5,000 Units Subcutaneous 3 times per day  . imipenem-cilastatin  250 mg Intravenous 4 times per day  . levETIRAcetam  500 mg Intravenous Q12H  . pantoprazole (PROTONIX) IV  40 mg Intravenous Q24H  . sodium chloride flush  3 mL Intravenous Q12H  . vancomycin  1,250 mg Intravenous Q24H   Continuous Infusions: . sodium chloride 125 mL/hr at 01/03/16 1343   PRN Meds:.hydrALAZINE, HYDROmorphone (DILAUDID) injection, labetalol, LORazepam, ondansetron **OR** ondansetron (ZOFRAN) IV  Antibiotics  :     Anti-infectives    Start     Dose/Rate Route Frequency Ordered Stop   01/05/16 1200  levofloxacin (LEVAQUIN) IVPB 750 mg  Status:  Discontinued     750 mg 100 mL/hr over 90 Minutes Intravenous Every 48 hours 01/03/16 1043 01/03/16 1301   01/04/16 1200  vancomycin (VANCOCIN) 1,250 mg in sodium chloride 0.9 % 250 mL IVPB     1,250 mg 166.7 mL/hr over 90 Minutes Intravenous Every 24 hours 01/03/16 1043     01/04/16 1000  azithromycin (ZITHROMAX) 500 mg in dextrose 5 % 250 mL IVPB     500 mg 250 mL/hr over 60 Minutes Intravenous Every 24 hours 01/03/16 1306 01/09/16 0959   01/03/16 2000  aztreonam (AZACTAM) 1 g in dextrose 5 % 50 mL IVPB  Status:  Discontinued     1 g 100 mL/hr over 30 Minutes Intravenous Every 8 hours 01/03/16 1043 01/03/16 1300   01/03/16 1800  imipenem-cilastatin (PRIMAXIN) 250 mg in sodium chloride 0.9 % 100 mL IVPB     250 mg 200 mL/hr over 30 Minutes Intravenous 4 times per day 01/03/16 1304     01/03/16 1045  levofloxacin (LEVAQUIN) IVPB 750 mg     750 mg 100 mL/hr over 90 Minutes Intravenous  Once 01/03/16 1034 01/03/16 1233    01/03/16 1045  aztreonam (AZACTAM) 2 g in dextrose 5 % 50 mL IVPB     2 g 100 mL/hr over 30 Minutes Intravenous  Once 01/03/16 1034 01/03/16 1227   01/03/16 1045  vancomycin (VANCOCIN) IVPB 1000 mg/200 mL premix  Status:  Discontinued     1,000 mg 200 mL/hr over 60 Minutes Intravenous  Once 01/03/16 1034 01/03/16 1035   01/03/16 1045  vancomycin (VANCOCIN) 2,000 mg in sodium chloride 0.9 % 500 mL IVPB     2,000 mg 250 mL/hr over 120 Minutes Intravenous  Once 01/03/16 1035 01/03/16 1459        Objective:   Filed Vitals:   01/03/16 2033 01/03/16 2041 01/04/16 0500 01/04/16 0535  BP:    154/74  Pulse:    87  Temp:    99.4 F (37.4 C)  TempSrc:    Oral  Resp:      SpO2: 89% 98% 100% 100%    Wt Readings from Last 3 Encounters:  04/27/15 101.152 kg (223 lb)  09/13/14 101.152 kg (223 lb)  09/05/14 101.152 kg (223 lb)     Intake/Output Summary (Last 24 hours) at 01/04/16 1030 Last data filed at 01/04/16 0900  Gross per 24 hour  Intake   1700 ml  Output      0 ml  Net   1700 ml     Physical Exam  Awake , appears weak and dehydrated, follows basic commands and answers basic questions, Normal affect [place].AT,PERRAL Supple Neck,No JVD, No cervical lymphadenopathy appriciated.  Symmetrical Chest wall movement, Good air movement bilaterally, CTAB RRR,No Gallops,Rubs or new Murmurs, No Parasternal Heave +ve B.Sounds, Abd Soft, No tenderness, No organomegaly appriciated, No rebound - guarding or rigidity. No Cyanosis, Clubbing or edema, No new Rash or bruise, right tibia has IO line        Data Review:   Micro Results Recent Results (from the past 240 hour(s))  Urine culture     Status: None (Preliminary result)   Collection Time: 01/03/16  9:45 AM  Result Value Ref Range Status   Specimen Description URINE, CATHETERIZED  Final   Special Requests NONE  Final   Culture 70,000 COLONIES/ml GRAM NEGATIVE RODS  Final   Report Status PENDING  Incomplete  Blood culture (routine  x 2)     Status: None (Preliminary result)   Collection Time: 01/03/16 10:50 AM  Result Value Ref Range Status   Specimen Description BLOOD LEFT ANTECUBITAL  Final   Special Requests BOTTLES DRAWN AEROBIC AND ANAEROBIC 5CC  Final   Culture  Setup Time   Final    GRAM POSITIVE COCCI IN BOTH AEROBIC AND ANAEROBIC BOTTLES CRITICAL RESULT CALLED TO, READ  BACK BY AND VERIFIED WITH: KBROWN,RN @0431  01/04/16 MKELLY    Culture TOO YOUNG TO READ  Final   Report Status PENDING  Incomplete  MRSA PCR Screening     Status: None   Collection Time: 01/03/16  6:02 PM  Result Value Ref Range Status   MRSA by PCR NEGATIVE NEGATIVE Final    Comment:        The GeneXpert MRSA Assay (FDA approved for NASAL specimens only), is one component of a comprehensive MRSA colonization surveillance program. It is not intended to diagnose MRSA infection nor to guide or monitor treatment for MRSA infections.     Radiology Reports Ct Head Wo Contrast  01/03/2016  CLINICAL DATA:  History is seizure for 2 hours before coming to the emergency department. EXAM: CT HEAD WITHOUT CONTRAST TECHNIQUE: Contiguous axial images were obtained from the base of the skull through the vertex without intravenous contrast. COMPARISON:  10/08/2009 FINDINGS: There is no evidence of mass effect, midline shift, or extra-axial fluid collections. There is no evidence of a space-occupying lesion or intracranial hemorrhage. There is no evidence of a cortical-based area of acute infarction. 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 There is a small old left parietal lobe infarct with encephalomalacia. There is generalized cerebral atrophy. There is periventricular white matter low attenuation likely secondary to microangiopathy. The ventricles and sulci are appropriate for the patient's age. The basal cisterns are patent. Visualized portions of the orbits are unremarkable. The visualized portions of the paranasal sinuses  and mastoid air cells are unremarkable. Cerebrovascular atherosclerotic calcifications are noted. The osseous structures are unremarkable. IMPRESSION: No acute intracranial pathology. Electronically Signed   By: Kathreen Devoid   On: 01/03/2016 10:14   Dg Chest Port 1 View  01/03/2016  CLINICAL DATA:  Seizures since 7 a.m. today. EXAM: PORTABLE CHEST 1 VIEW COMPARISON:  01/25/2014. FINDINGS: Poor inspiration. Enlarged cardiac silhouette with overall improvement. Mild patchy opacity in the right upper lobe. Prominent interstitial markings elsewhere in both lungs. Thoracic spine degenerative changes. Cholecystectomy clips. IMPRESSION: 1. Mild patchy pneumonia or aspiration pneumonitis in the right upper lobe. 2. Cardiomegaly and chronic interstitial lung disease. Electronically Signed   By: Claudie Revering M.D.   On: 01/03/2016 10:18     CBC  Recent Labs Lab 01/03/16 0935 01/03/16 0941 01/04/16 0551  WBC 15.5*  --  13.5*  HGB 11.1* 13.6 10.0*  HCT 35.4* 40.0 31.1*  PLT 256  --  154  MCV 98.3  --  96.0  MCH 30.8  --  30.9  MCHC 31.4  --  32.2  RDW 14.6  --  14.5  LYMPHSABS 1.3  --   --   MONOABS 0.4  --   --   EOSABS 0.0  --   --   BASOSABS 0.0  --   --     Chemistries   Recent Labs Lab 01/03/16 0935 01/03/16 0941 01/03/16 1406 01/04/16 0551  NA 147* 147*  --  153*  K 3.3* 3.3*  --  3.1*  CL 111 109  --  116*  CO2 20*  --   --  22  GLUCOSE 228* 220*  --  121*  BUN 99* 102*  --  72*  CREATININE 1.94* 1.60*  --  1.47*  CALCIUM 8.5*  --   --  8.4*  MG  --   --  1.7  --   AST 27  --   --  16  ALT 12*  --   --  10*  ALKPHOS 108  --   --  75  BILITOT 0.2*  --   --  0.5   ------------------------------------------------------------------------------------------------------------------ No results for input(s): CHOL, HDL, LDLCALC, TRIG, CHOLHDL, LDLDIRECT in the last 72 hours.  Lab Results  Component Value Date   HGBA1C 4.7* 01/03/2016    ------------------------------------------------------------------------------------------------------------------ No results for input(s): TSH, T4TOTAL, T3FREE, THYROIDAB in the last 72 hours.  Invalid input(s): FREET3 ------------------------------------------------------------------------------------------------------------------ No results for input(s): VITAMINB12, FOLATE, FERRITIN, TIBC, IRON, RETICCTPCT in the last 72 hours.  Coagulation profile No results for input(s): INR, PROTIME in the last 168 hours.  No results for input(s): DDIMER in the last 72 hours.  Cardiac Enzymes No results for input(s): CKMB, TROPONINI, MYOGLOBIN in the last 168 hours.  Invalid input(s): CK ------------------------------------------------------------------------------------------------------------------ No results found for: BNP  Time Spent in minutes  35   Jakerra Floyd K M.D on 01/04/2016 at 10:30 AM  Between 7am to 7pm - Pager - [phone]  After 7pm go to [email or web address] - password [facility]  [facility] -  Office  [phone]

## 2016-01-05 DIAGNOSIS — Z515 Encounter for palliative care: Secondary | ICD-10-CM | POA: Insufficient documentation

## 2016-01-05 DIAGNOSIS — Z7189 Other specified counseling: Secondary | ICD-10-CM

## 2016-01-05 LAB — CBC
HCT: 28 % — ABNORMAL LOW (ref 36.0–46.0)
Hemoglobin: 8.8 g/dL — ABNORMAL LOW (ref 12.0–15.0)
MCH: 30.7 pg (ref 26.0–34.0)
MCHC: 31.4 g/dL (ref 30.0–36.0)
MCV: 97.6 fL (ref 78.0–100.0)
PLATELETS: 140 10*3/uL — AB (ref 150–400)
RBC: 2.87 MIL/uL — ABNORMAL LOW (ref 3.87–5.11)
RDW: 14.9 % (ref 11.5–15.5)
WBC: 7.2 10*3/uL (ref 4.0–10.5)

## 2016-01-05 LAB — BASIC METABOLIC PANEL
ANION GAP: 7 (ref 5–15)
BUN: 52 mg/dL — AB (ref 6–20)
CALCIUM: 8.8 mg/dL — AB (ref 8.9–10.3)
CHLORIDE: 117 mmol/L — AB (ref 101–111)
CO2: 25 mmol/L (ref 22–32)
CREATININE: 1.23 mg/dL — AB (ref 0.44–1.00)
GFR calc non Af Amer: 45 mL/min — ABNORMAL LOW (ref >60)
GFR, EST AFRICAN AMERICAN: 52 mL/min — AB (ref >60)
Glucose, Bld: 88 mg/dL (ref 65–99)
POTASSIUM: 3.4 mmol/L — AB (ref 3.5–5.1)
Sodium: 149 mmol/L — ABNORMAL HIGH (ref 135–145)

## 2016-01-05 LAB — GLUCOSE, CAPILLARY: Glucose-Capillary: 90 mg/dL (ref 65–99)

## 2016-01-05 LAB — MAGNESIUM: Magnesium: 1.7 mg/dL (ref 1.7–2.4)

## 2016-01-05 MED ORDER — MAGNESIUM SULFATE IN D5W 10-5 MG/ML-% IV SOLN
1.0000 g | Freq: Once | INTRAVENOUS | Status: AC
  Administered 2016-01-05: 1 g via INTRAVENOUS
  Filled 2016-01-05: qty 100

## 2016-01-05 MED ORDER — PANTOPRAZOLE SODIUM 40 MG PO TBEC
40.0000 mg | DELAYED_RELEASE_TABLET | Freq: Every day | ORAL | Status: DC
  Administered 2016-01-05 – 2016-01-06 (×2): 40 mg via ORAL
  Filled 2016-01-05 (×2): qty 1

## 2016-01-05 MED ORDER — POTASSIUM CHLORIDE 10 MEQ/100ML IV SOLN
10.0000 meq | INTRAVENOUS | Status: AC
  Administered 2016-01-05 (×4): 10 meq via INTRAVENOUS
  Filled 2016-01-05 (×3): qty 100

## 2016-01-05 MED ORDER — DEXTROSE 5 % IV SOLN
INTRAVENOUS | Status: AC
Start: 2016-01-05 — End: 2016-01-06
  Administered 2016-01-05 – 2016-01-06 (×2): via INTRAVENOUS

## 2016-01-05 MED ORDER — LEVETIRACETAM 500 MG PO TABS
500.0000 mg | ORAL_TABLET | Freq: Two times a day (BID) | ORAL | Status: DC
  Administered 2016-01-05 – 2016-01-06 (×2): 500 mg via ORAL
  Filled 2016-01-05 (×2): qty 1

## 2016-01-05 NOTE — Progress Notes (Signed)
Daily Progress Note   Patient Name: [name]       Date: 01/05/2016 DOB: 09/20/1951  Age: 65 y.o. MRN#: [id number] Attending Physician: Thurnell Lose, MD Primary Care Physician: Kandice Hams, MD Admit Date: 01/03/2016  Reason for Consultation/Follow-up: Establishing goals of care  Subjective:  Much more awake alert. Off nonrebreather mask. Husband and son at the bedside. Denies pain. Interval Events:  Patient has been seen and evaluated by speech therapy. She is deemed safe for starting mechanical soft diet with thin liquids. She denies any pain. Denies any shortness of breath.  The patient remains admitted for aspiration pneumonia, she is a nursing home resident. Hospital course also includes severe dehydration with hypernatremia acute kidney injury on top of stage III chronic kidney disease. She remains on IV fluids. 41 of 2 positive blood cultures she remains on vancomycin final cultures pending. She is tolerating Keppra well. No seizures. Recommendations from neurology to switch it to by mouth.  Likely discharge back to nursing facility within 24-48 hours. Recommend addition of palliative services back at her facility for extra layer of support given extensive nature of underlying chronic medical conditions. Otherwise, no acute recommendations within the scope of this hospitalization. Length of Stay: 2 days  Current Medications: Scheduled Meds:  . ampicillin-sulbactam (UNASYN) IV  1.5 g Intravenous Q8H  . furosemide  40 mg Intravenous BID  . heparin  5,000 Units Subcutaneous 3 times per day  . levETIRAcetam  500 mg Intravenous Q12H  . magnesium sulfate 1 - 4 g bolus IVPB  1 g Intravenous Once  . pantoprazole  40 mg Oral Daily  . potassium chloride  10 mEq Intravenous Q1  Hr x 4  . sodium chloride flush  3 mL Intravenous Q12H  . vancomycin  1,250 mg Intravenous Q24H    Continuous Infusions: . dextrose      PRN Meds: hydrALAZINE, labetalol, LORazepam, [DISCONTINUED] ondansetron **OR** ondansetron (ZOFRAN) IV  Physical Exam: Physical Exam             Much more awake alert  Sitting up in bed, off NRB Regular breathing S1 S2 Abdomen soft No edema LE Is able to verbalize well, generalized weakness, no focal deficits.  Vital Signs: BP 150/71 mmHg  Pulse 61  Temp(Src) 98.7 F (37.1 C) (Oral)  Resp  18  Ht 5' 2.99" (1.6 m)  Wt 101.2 kg (223 lb 1.7 oz)  BMI 39.53 kg/m2  SpO2 100% SpO2: SpO2: 100 % O2 Device: O2 Device: Nasal Cannula O2 Flow Rate: O2 Flow Rate (L/min): 15 L/min  Intake/output summary:  Intake/Output Summary (Last 24 hours) at 01/05/16 1059 Last data filed at 01/05/16 [id number]  Gross per 24 hour  Intake 1117.67 ml  Output    920 ml  Net 197.67 ml   LBM: Last BM Date:  (PTA) Baseline Weight: Weight: 101.2 kg (223 lb 1.7 oz) Most recent weight: Weight: 101.2 kg (223 lb 1.7 oz)       Palliative Assessment/Data: Flowsheet Rows        Most Recent Value   Intake Tab    Referral Department  Hospitalist   Unit at Time of Referral  Med/Surg Unit   Palliative Care Primary Diagnosis  Neurology   Date Notified  01/03/16   Palliative Care Type  Return patient Palliative Care   Reason for referral  Clarify Goals of Care   Date of Admission  01/03/16   Date first seen by Palliative Care  01/04/16   # of days Palliative referral response time  1 Day(s)   # of days IP prior to Palliative referral  0   Clinical Assessment    Palliative Performance Scale Score  30%   Pain Max last 24 hours  3   Pain Min Last 24 hours  2   Dyspnea Max Last 24 Hours  2   Dyspnea Min Last 24 hours  1   Psychosocial & Spiritual Assessment    Palliative Care Outcomes    Patient/Family meeting held?  Yes   Who was at the meeting?  son husband patient    Palliative Care Outcomes  Clarified goals of care   Palliative Care follow-up planned  Yes, Facility      Additional Data Reviewed: CBC    Component Value Date/Time   WBC 7.2 01/05/2016 0608   RBC 2.87* 01/05/2016 0608   RBC 3.33* 07/06/2013 0842   HGB 8.8* 01/05/2016 0608   HCT 28.0* 01/05/2016 0608   PLT 140* 01/05/2016 0608   MCV 97.6 01/05/2016 0608   MCH 30.7 01/05/2016 0608   MCHC 31.4 01/05/2016 0608   RDW 14.9 01/05/2016 0608   LYMPHSABS 1.3 01/03/2016 0935   MONOABS 0.4 01/03/2016 0935   EOSABS 0.0 01/03/2016 0935   BASOSABS 0.0 01/03/2016 0935    CMP     Component Value Date/Time   NA 149* 01/05/2016 0608   K 3.4* 01/05/2016 0608   CL 117* 01/05/2016 0608   CO2 25 01/05/2016 0608   GLUCOSE 88 01/05/2016 0608   BUN 52* 01/05/2016 0608   CREATININE 1.23* 01/05/2016 0608   CREATININE 2.65* 07/01/2013 1108   CALCIUM 8.8* 01/05/2016 0608   PROT 5.3* 01/04/2016 0551   ALBUMIN 2.0* 01/04/2016 0551   AST 16 01/04/2016 0551   ALT 10* 01/04/2016 0551   ALKPHOS 75 01/04/2016 0551   BILITOT 0.5 01/04/2016 0551   GFRNONAA 45* 01/05/2016 0608   GFRNONAA 19* 07/01/2013 1108   GFRAA 52* 01/05/2016 0608   GFRAA 21* 07/01/2013 1108       Problem List:  Patient Active Problem List   Diagnosis Date Noted  . Goals of care, counseling/discussion   . HCAP (healthcare-associated pneumonia) 01/03/2016  . Seizure ([address]) 01/03/2016  . Aspiration pneumonia ([place]) 01/03/2016  . Essential hypertension 01/03/2016  . End of life care 01/03/2016  .  Hyperglycemia 01/03/2016  . CKD (chronic kidney disease) 01/03/2016  . Immobility 12/14/2014  . Chronic airway obstruction, not elsewhere classified 09/07/2014  . Anemia in chronic kidney disease(285.21) 08/31/2014  . Candidiasis of skin 06/23/2014  . Congestive heart disease ([place]) 06/21/2014  . Type 2 diabetes mellitus with other diabetic kidney complication ([place]) 123XX123  . Insomnia 05/16/2014  . Gastroparesis 04/22/2014    . Anxiety state, unspecified 04/22/2014  . Obesity hypoventilation syndrome ([place]) 02/05/2014  . Chronic diastolic CHF (congestive heart failure) ([place]) 02/05/2014  . Acute systolic heart failure ([place]) 01/26/2014  . Hypernatremia 12/10/2013  . A-fib ([place]) 12/03/2013  . Chronic anemia 11/17/2013  . Gout 11/17/2013  . Obesity 11/17/2013  . OSA (obstructive sleep apnea) 11/17/2013  . Nausea alone 11/14/2013  . Wound of right leg 11/14/2013  . Unspecified constipation 11/12/2013  . Acute respiratory failure ([place]) 11/10/2013  . CHF (congestive heart failure) ([place]) 11/09/2013  . SOB (shortness of breath) 10/19/2013  . 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 Hematoma complicating a procedure 10/15/2013  . Bleeding into the skin 08/10/2013  . Bright red rectal bleeding 08/10/2013  . PAF (paroxysmal atrial fibrillation) ([place]) 07/05/2013  . Psoriasis 07/05/2013  . Chronic combined systolic and diastolic CHF (congestive heart failure) ([place]) 07/05/2013  . CKD (chronic kidney disease), stage IV ([place]) 08/15/2012  . Anemia of chronic disease 08/15/2012  . DM (diabetes mellitus), type 2 with renal complications ([place]) [id number]  . ATRIAL FIBRILLATION WITH RAPID VENTRICULAR RESPONSE 10/17/2010  . Acute combined systolic and diastolic heart failure ([place]) 10/17/2010  . HYPERLIPIDEMIA 03/18/2010  . HYPERTENSION 03/18/2010  . C V A / STROKE 03/18/2010  . OBSTRUCTIVE SLEEP APNEA 03/15/2010     Palliative Care Assessment & Plan    1.Code Status:  DNR    Code Status Orders        Start     Ordered   01/03/16 2039  Do not attempt resuscitation (DNR)   Continuous    Question Answer Comment  In the event of cardiac or respiratory ARREST Do not call a "code blue"   In the event of cardiac or respiratory ARREST Do not perform Intubation, CPR, defibrillation or ACLS   In the event of cardiac or respiratory ARREST Use medication by any route, position, wound care, and other  measures to relive pain and suffering. May use oxygen, suction and manual treatment of airway obstruction as needed for comfort.      01/03/16 2038    Code Status History    Date Active Date Inactive Code Status Order ID Comments User Context   01/03/2016  1:12 PM 01/03/2016  8:38 PM Partial Code [id number]  Waldemar Dickens, MD ED   01/25/2014 10:31 PM 01/28/2014  7:40 PM Full Code [id number]  Etta Quill, DO ED   12/03/2013  2:20 PM 12/16/2013  4:36 PM Full Code [id number]  Flonnie Overman Dhungel, MD Inpatient   11/17/2013  4:12 PM 11/22/2013 10:12 PM Full Code [id number]  Delfina Redwood, MD Inpatient   11/09/2013  8:15 PM 11/16/2013  9:46 PM Full Code [id number]  Domenic Polite, MD Inpatient   10/13/2013  8:14 PM 10/21/2013  4:02 PM Full Code [id number]  Allie Bossier, MD Inpatient   09/09/2013  4:27 AM 09/22/2013  7:43 PM Full Code [id number]  Phillips Grout, MD Inpatient   08/10/2013  2:38 PM 08/19/2013  5:30 PM Full Code [id number]  Louellen Molder, MD Inpatient   08/03/2013  5:19 PM 08/08/2013  5:46 PM Full Code [id number]  Modena Jansky, MD Inpatient   07/05/2013 10:58 PM 07/07/2013  5:55 PM Full Code [id number]  Delfina Redwood, MD Inpatient   02/11/2013  5:39 AM 02/18/2013  5:45 PM Full Code [id number]  Etta Quill, DO ED   08/15/2012  5:45 PM 08/29/2012  4:38 PM Full Code [id number]  Hervey Ard, RN Inpatient   01/21/2012 11:06 PM 02/05/2012 10:46 PM Full Code [id number]  Bronson Curb, RN Inpatient   11/23/2011  4:04 AM 11/29/2011  6:59 PM Full Code [id number]  Cecilie Lowers, RN Inpatient    Advance Directive Documentation        Most Recent Value   Type of Advance Directive  Out of facility DNR (pink MOST or yellow form)   Pre-existing out of facility DNR order (yellow form or pink MOST form)  Yellow form placed in chart (order not valid for inpatient use)   "MOST" Form in Place?         2. Goals of Care/Additional Recommendations:     Limitations on Scope of Treatment:   Desire for  further Chaplaincy support:no  Psycho-social Needs: Caregiving  Support/Resources  3. Symptom Management:      1. As above  4. Palliative Prophylaxis:   Delirium Protocol  5. Prognosis: ?< 12 months  6. Discharge Planning:  Will recommend skilled Nursing Facility for rehab with Palliative care service follow-up on discharge.    Care plan was discussed with  Patient, husband, son  Thank you for allowing the Palliative Medicine Team to assist in the care of this patient.   Time In: 10 Time Out: 1025 Total Time 25 Prolonged Time Billed  no        [phone]  Loistine Chance, MD  01/05/2016, 10:59 AM  Please contact Palliative Medicine Team phone at [phone] for questions and concerns.

## 2016-01-05 NOTE — Progress Notes (Signed)
Subjective: No complaints and more alert.   Exam: Filed Vitals:   01/04/16 2023 01/05/16 0548  BP: 179/66 150/71  Pulse: 61   Temp: 98.8 F (37.1 C) 98.7 F (37.1 C)  Resp: 19 18        Gen: In bed, NAD MS: alert and oriented to date and year [id number], EOMI, TML Face equal  Motor: moving all extremities Sensory: intact   Pertinent Labs: none  Etta Quill PA-C [facility] [phone]  Impression: no further seizures over night while on Keppra. Tolerating well. Plan is to be discharged to SNF.  patient should remain on KEppra 500 mg BID and may change to oral.       01/05/2016, 9:58 AM

## 2016-01-05 NOTE — Progress Notes (Signed)
Patient Demographics:    [name], is a 65 y.o. female, DOB - December 21, 1950, [id number]  Admit date - 01/03/2016   Admitting Physician Waldemar Dickens, MD  Outpatient Primary MD for the patient is Kandice Hams, MD  LOS - 2   Chief Complaint  Patient presents with  . Seizures        Subjective:    [employer] today has, No headache, No chest pain, No abdominal pain - No Nausea, No new weakness tingling or numbness, No Cough - SOB.     Assessment  & Plan :     1. Aspiration pneumonia in a healthcare setting. Monitor cultures, aspiration precautions, speech following currently on dysphagia 3 diet with feeding assistance and aspiration precautions, continue on Unasyn and monitor. Much improved with Supportive care.   2. Severe dehydration with hypernatremia, ARF on CK D3. Switch IV fluids to D5W, monitor electrolytes. Baseline creatinine close to 1.6. Improved.   3. 1 out of 2 blood culture positive for gram-positive cocci. Cover with vancomycin until culture finalizes, could be contamination.   4. Klebsiella UTI. Sensitive to Unasyn and continue.   5. New onset seizures. Head CT nonacute, could be due to metabolic encephalopathy, neuro on board, placed on Keppra. Now seizure free.   6. Deconditioning, chronically bedbound, poor quality of life. Family wants gentle medical treatment. If declines full comfort care. Will involve palliative care as well.   7.Chr.Diastolic Ef [id number] - dehydrated on D5W.   8.PAfib: no anticoagulation per family. Initially in RVR. CHADSVASC score 6, - continue beta blocker for rate control.    9.HTN - when necessary hydralazine.   10. Questionable diabetes mellitus. Doubt diagnosis, A1c 4.7.    Code Status : DO NOT RESUSCITATE  Family  Communication  : Husband bedside  Disposition Plan  : SNF in 2-3 days  Consults  :   Pall care, Neuro  Procedures  :   CT head nonacute  DVT Prophylaxis  :  Heparin   Lab Results  Component Value Date   PLT 140* 01/05/2016    Inpatient Medications  Scheduled Meds: . ampicillin-sulbactam (UNASYN) IV  1.5 g Intravenous Q8H  . furosemide  40 mg Intravenous BID  . heparin  5,000 Units Subcutaneous 3 times per day  . levETIRAcetam  500 mg Intravenous Q12H  . pantoprazole (PROTONIX) IV  40 mg Intravenous Q24H  . potassium chloride  10 mEq Intravenous Q1 Hr x 4  . sodium chloride flush  3 mL Intravenous Q12H  . vancomycin  1,250 mg Intravenous Q24H   Continuous Infusions: . dextrose     PRN Meds:.hydrALAZINE, labetalol, LORazepam, [DISCONTINUED] ondansetron **OR** ondansetron (ZOFRAN) IV  Antibiotics  :     Anti-infectives    Start     Dose/Rate Route Frequency Ordered Stop   01/05/16 1200  levofloxacin (LEVAQUIN) IVPB 750 mg  Status:  Discontinued     750 mg 100 mL/hr over 90 Minutes Intravenous Every 48 hours 01/03/16 1043 01/03/16 1301   01/04/16 1200  vancomycin (VANCOCIN) 1,250 mg in sodium chloride 0.9 % 250 mL IVPB  Status:  Discontinued     1,250 mg 166.7 mL/hr over 90 Minutes Intravenous Every 24 hours 01/03/16 1043 01/04/16 1031   01/04/16  1200  ampicillin-sulbactam (UNASYN) 1.5 g in sodium chloride 0.9 % 50 mL IVPB     1.5 g 100 mL/hr over 30 Minutes Intravenous Every 8 hours 01/04/16 1039     01/04/16 1200  vancomycin (VANCOCIN) 1,250 mg in sodium chloride 0.9 % 250 mL IVPB     1,250 mg 166.7 mL/hr over 90 Minutes Intravenous Every 24 hours 01/04/16 1039     01/04/16 1000  azithromycin (ZITHROMAX) 500 mg in dextrose 5 % 250 mL IVPB  Status:  Discontinued     500 mg 250 mL/hr over 60 Minutes Intravenous Every 24 hours 01/03/16 1306 01/04/16 1031   01/03/16 2000  aztreonam (AZACTAM) 1 g in dextrose 5 % 50 mL IVPB  Status:  Discontinued     1 g 100 mL/hr over  30 Minutes Intravenous Every 8 hours 01/03/16 1043 01/03/16 1300   01/03/16 1800  imipenem-cilastatin (PRIMAXIN) 250 mg in sodium chloride 0.9 % 100 mL IVPB  Status:  Discontinued     250 mg 200 mL/hr over 30 Minutes Intravenous 4 times per day 01/03/16 1304 01/04/16 1031   01/03/16 1045  levofloxacin (LEVAQUIN) IVPB 750 mg     750 mg 100 mL/hr over 90 Minutes Intravenous  Once 01/03/16 1034 01/03/16 1233   01/03/16 1045  aztreonam (AZACTAM) 2 g in dextrose 5 % 50 mL IVPB     2 g 100 mL/hr over 30 Minutes Intravenous  Once 01/03/16 1034 01/03/16 1227   01/03/16 1045  vancomycin (VANCOCIN) IVPB 1000 mg/200 mL premix  Status:  Discontinued     1,000 mg 200 mL/hr over 60 Minutes Intravenous  Once 01/03/16 1034 01/03/16 1035   01/03/16 1045  vancomycin (VANCOCIN) 2,000 mg in sodium chloride 0.9 % 500 mL IVPB     2,000 mg 250 mL/hr over 120 Minutes Intravenous  Once 01/03/16 1035 01/03/16 1459        Objective:   Filed Vitals:   01/04/16 1000 01/04/16 1324 01/04/16 2023 01/05/16 0548  BP:  134/40 179/66 150/71  Pulse:  64 61   Temp:  99.1 F (37.3 C) 98.8 F (37.1 C) 98.7 F (37.1 C)  TempSrc:  Oral Oral Oral  Resp:  20 19 18   Height: 5' 2.99" (1.6 m)     Weight: 101.2 kg (223 lb 1.7 oz)     SpO2:  100% 100%     Wt Readings from Last 3 Encounters:  01/04/16 101.2 kg (223 lb 1.7 oz)  04/27/15 101.152 kg (223 lb)  09/13/14 101.152 kg (223 lb)     Intake/Output Summary (Last 24 hours) at 01/05/16 1011 Last data filed at 01/05/16 0727  Gross per 24 hour  Intake 1117.67 ml  Output    920 ml  Net 197.67 ml     Physical Exam  Awake, much more alert, moves all 4 extremities to commands, Normal affect [place].AT,PERRAL Supple Neck,No JVD, No cervical lymphadenopathy appriciated.  Symmetrical Chest wall movement, Good air movement bilaterally, CTAB RRR,No Gallops,Rubs or new Murmurs, No Parasternal Heave +ve B.Sounds, Abd Soft, No tenderness, No organomegaly appriciated, No  rebound - guarding or rigidity. No Cyanosis, Clubbing or edema, No new Rash or bruise, right tibia has IO line        Data Review:   Micro Results Recent Results (from the past 240 hour(s))  Urine culture     Status: None (Preliminary result)   Collection Time: 01/03/16  9:45 AM  Result Value Ref Range Status   Specimen Description URINE,  CATHETERIZED  Final   Special Requests NONE  Final   Culture   Final    70,000 COLONIES/ml ESCHERICHIA COLI 30,000 COLONIES/mL KLEBSIELLA PNEUMONIAE    Report Status PENDING  Incomplete   Organism ID, Bacteria KLEBSIELLA PNEUMONIAE  Final      Susceptibility   Klebsiella pneumoniae - MIC*    AMPICILLIN >=32 RESISTANT Resistant     CEFAZOLIN <=4 SENSITIVE Sensitive     CEFTRIAXONE <=1 SENSITIVE Sensitive     CIPROFLOXACIN <=0.25 SENSITIVE Sensitive     GENTAMICIN <=1 SENSITIVE Sensitive     IMIPENEM <=0.25 SENSITIVE Sensitive     NITROFURANTOIN 64 INTERMEDIATE Intermediate     TRIMETH/SULFA <=20 SENSITIVE Sensitive     AMPICILLIN/SULBACTAM 4 SENSITIVE Sensitive     PIP/TAZO <=4 SENSITIVE Sensitive     * 30,000 COLONIES/mL KLEBSIELLA PNEUMONIAE  Blood culture (routine x 2)     Status: None (Preliminary result)   Collection Time: 01/03/16 10:50 AM  Result Value Ref Range Status   Specimen Description BLOOD LEFT ANTECUBITAL  Final   Special Requests BOTTLES DRAWN AEROBIC AND ANAEROBIC 5CC  Final   Culture  Setup Time   Final    GRAM POSITIVE COCCI IN BOTH AEROBIC AND ANAEROBIC BOTTLES CRITICAL RESULT CALLED TO, READ BACK BY AND VERIFIED WITH: KBROWN,RN @0431  01/04/16 MKELLY    Culture TOO YOUNG TO READ  Final   Report Status PENDING  Incomplete  Blood culture (routine x 2)     Status: None (Preliminary result)   Collection Time: 01/03/16 10:54 AM  Result Value Ref Range Status   Specimen Description BLOOD BLOOD LEFT FOREARM  Final   Special Requests BOTTLES DRAWN AEROBIC AND ANAEROBIC 5CC  Final   Culture NO GROWTH 1 DAY  Final    Report Status PENDING  Incomplete  MRSA PCR Screening     Status: None   Collection Time: 01/03/16  6:02 PM  Result Value Ref Range Status   MRSA by PCR NEGATIVE NEGATIVE Final    Comment:        The GeneXpert MRSA Assay (FDA approved for NASAL specimens only), is one component of a comprehensive MRSA colonization surveillance program. It is not intended to diagnose MRSA infection nor to guide or monitor treatment for MRSA infections.     Radiology Reports Ct Head Wo Contrast  01/03/2016  CLINICAL DATA:  History is seizure for 2 hours before coming to the emergency department. EXAM: CT HEAD WITHOUT CONTRAST TECHNIQUE: Contiguous axial images were obtained from the base of the skull through the vertex without intravenous contrast. COMPARISON:  10/08/2009 FINDINGS: There is no evidence of mass effect, midline shift, or extra-axial fluid collections. There is no evidence of a space-occupying lesion or intracranial hemorrhage. There is no evidence of a cortical-based area of acute infarction. 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 There is a small old left parietal lobe infarct with encephalomalacia. There is generalized cerebral atrophy. There is periventricular white matter low attenuation likely secondary to microangiopathy. The ventricles and sulci are appropriate for the patient's age. The basal cisterns are patent. Visualized portions of the orbits are unremarkable. The visualized portions of the paranasal sinuses and mastoid air cells are unremarkable. Cerebrovascular atherosclerotic calcifications are noted. The osseous structures are unremarkable. IMPRESSION: No acute intracranial pathology. Electronically Signed   By: Kathreen Devoid   On: 01/03/2016 10:14   Dg Chest Port 1 View  01/03/2016  CLINICAL DATA:  Seizures since 7 a.m. today. EXAM: PORTABLE  CHEST 1 VIEW COMPARISON:  01/25/2014. FINDINGS: Poor inspiration. Enlarged cardiac silhouette with overall  improvement. Mild patchy opacity in the right upper lobe. Prominent interstitial markings elsewhere in both lungs. Thoracic spine degenerative changes. Cholecystectomy clips. IMPRESSION: 1. Mild patchy pneumonia or aspiration pneumonitis in the right upper lobe. 2. Cardiomegaly and chronic interstitial lung disease. Electronically Signed   By: Claudie Revering M.D.   On: 01/03/2016 10:18     CBC  Recent Labs Lab 01/03/16 0935 01/03/16 0941 01/04/16 0551 01/05/16 0608  WBC 15.5*  --  13.5* 7.2  HGB 11.1* 13.6 10.0* 8.8*  HCT 35.4* 40.0 31.1* 28.0*  PLT 256  --  154 140*  MCV 98.3  --  96.0 97.6  MCH 30.8  --  30.9 30.7  MCHC 31.4  --  32.2 31.4  RDW 14.6  --  14.5 14.9  LYMPHSABS 1.3  --   --   --   MONOABS 0.4  --   --   --   EOSABS 0.0  --   --   --   BASOSABS 0.0  --   --   --     Chemistries   Recent Labs Lab 01/03/16 0935 01/03/16 0941 01/03/16 1406 01/04/16 0551 01/05/16 0608  NA 147* 147*  --  153* 149*  K 3.3* 3.3*  --  3.1* 3.4*  CL 111 109  --  116* 117*  CO2 20*  --   --  22 25  GLUCOSE 228* 220*  --  121* 88  BUN 99* 102*  --  72* 52*  CREATININE 1.94* 1.60*  --  1.47* 1.23*  CALCIUM 8.5*  --   --  8.4* 8.8*  MG  --   --  1.7  --  1.7  AST 27  --   --  16  --   ALT 12*  --   --  10*  --   ALKPHOS 108  --   --  75  --   BILITOT 0.2*  --   --  0.5  --    ------------------------------------------------------------------------------------------------------------------ No results for input(s): CHOL, HDL, LDLCALC, TRIG, CHOLHDL, LDLDIRECT in the last 72 hours.  Lab Results  Component Value Date   HGBA1C 4.7* 01/03/2016   ------------------------------------------------------------------------------------------------------------------  Recent Labs  01/04/16 1450  TSH 2.378   ------------------------------------------------------------------------------------------------------------------ No results for input(s): VITAMINB12, FOLATE, FERRITIN, TIBC, IRON,  RETICCTPCT in the last 72 hours.  Coagulation profile No results for input(s): INR, PROTIME in the last 168 hours.  No results for input(s): DDIMER in the last 72 hours.  Cardiac Enzymes No results for input(s): CKMB, TROPONINI, MYOGLOBIN in the last 168 hours.  Invalid input(s): CK ------------------------------------------------------------------------------------------------------------------ No results found for: BNP  Time Spent in minutes  35   Donique Hammonds K M.D on 01/05/2016 at 10:11 AM  Between 7am to 7pm - Pager - [phone]  After 7pm go to [email or web address] - password [facility]  [facility] -  Office  [phone]

## 2016-01-05 NOTE — Clinical Social Work Note (Signed)
Clinical Social Work Assessment  Patient Details  Name: [name] MRN: [id number] Date of Birth: [birth year]  Date of referral:  01/05/16               Reason for consult:  Facility Placement                Permission sought to share information with:  Facility Sport and exercise psychologist, Family Supports Permission granted to share information::  Yes, Verbal Permission Granted  Name::     Larrie  Agency::  [facility]  Relationship::  Spouse  Contact Information:  [phone]  Housing/Transportation Living arrangements for the past 2 months:  [place] of Information:  Spouse Patient Interpreter Needed:  None Criminal Activity/Legal Involvement Pertinent to Current Situation/Hospitalization:  No - Comment as needed Significant Relationships:  Spouse Lives with:  Self Do you feel safe going back to the place where you live?  Yes Need for family participation in patient care:  Yes (Comment)  Care giving concerns:  CSW received referral for possible SNF placement at time of discharge. Patient is disoriented. CSW spoke with patient's husband regarding discharge plan. Patient's husband is in agreement with plan for patient to return to [facility]. CSW to continue to follow and assist with discharge planning needs.   Social Worker assessment / plan:  CSW spoke with patient's husband concerning discharge plan back to [facility].  Employment status:  Retired Nurse, adult PT Recommendations:  Not assessed at this time Information / Referral to community resources:     Patient/Family's Response to care:  Patient's husband is in agreement with plan.  Patient/Family's Understanding of and Emotional Response to Diagnosis, Current Treatment, and Prognosis:  No questions/concerns. Emotional Assessment Appearance:  Appears stated age Attitude/Demeanor/Rapport:    Affect (typically observed):  Unable to Assess (Patient  disoriented) Orientation:   (Disoriented) Alcohol / Substance use:  Not Applicable Psych involvement (Current and /or in the community):  No (Comment)  Discharge Needs  Concerns to be addressed:  Care Coordination Readmission within the last 30 days:  No Current discharge risk:  None Barriers to Discharge:  Continued Medical Work up   [employer], [place] 01/05/2016, 2:41 PM

## 2016-01-05 NOTE — Care Management Important Message (Signed)
Important Message  Patient Details  Name: [name] MRN: [id number] Date of Birth: 31-Mar-1951   Medicare Important Message Given:  Yes    [name] 01/05/2016, 11:02 AM

## 2016-01-05 NOTE — Progress Notes (Signed)
Speech Language Pathology Treatment: Dysphagia  Patient Details Name: [name] MRN: [id number] DOB: 05/08/1951 Today's Date: 01/05/2016 Time: [id number] SLP Time Calculation (min) (ACUTE ONLY): 30 min  Assessment / Plan / Recommendation Clinical Impression  Patient seen to determine readiness for PO's. Patient is alert, verbally responding to SLP's questions, follows commands. SLP assessed patient's toleration of thin liquids via cup and via straw sips, regular solids, puree solids. Patient tolerated small cup sips of thin liquids with no overt s/s of aspiration, tolerated puree solids with minimal oral transit delay, and minimal delay in swallow initiation. Patient was able to masticate and orally transit hard solids (graham crackers), however trace oral residuals remained post initial swallow and patient exhibited delayed initiation of swallow. Patient also exhibited delayed cough after regular solids, and delayed cough following straw sips of thin liquids.   HPI HPI: 65 y.o. female who presents from SNF with seizure activity. PMH significant for GERD, SOB, DM, PNA, CVA. Husband at bedside denies any h/o swallowing PTA.      SLP Plan  Continue with current plan of care     Recommendations  Diet recommendations: Dysphagia 3 (mechanical soft);Thin liquid Liquids provided via: No straw;Cup Medication Administration: Whole meds with puree Supervision: Staff to assist with self feeding;Full supervision/cueing for compensatory strategies Compensations: Slow rate;Small sips/bites;Minimize environmental distractions Postural Changes and/or Swallow Maneuvers: Upright 30-60 min after meal;Seated upright 90 degrees             Oral Care Recommendations: Oral care QID Follow up Recommendations: Skilled Nursing facility Plan: Continue with current plan of care     GO                [name] 01/05/2016, 3:13 PM  Sonia Baller, MA, CCC-SLP 01/05/2016 3:17 PM

## 2016-01-06 LAB — BASIC METABOLIC PANEL
ANION GAP: 6 (ref 5–15)
BUN: 37 mg/dL — ABNORMAL HIGH (ref 6–20)
CALCIUM: 8.4 mg/dL — AB (ref 8.9–10.3)
CHLORIDE: 113 mmol/L — AB (ref 101–111)
CO2: 25 mmol/L (ref 22–32)
Creatinine, Ser: 1.15 mg/dL — ABNORMAL HIGH (ref 0.44–1.00)
GFR calc non Af Amer: 49 mL/min — ABNORMAL LOW (ref >60)
GFR, EST AFRICAN AMERICAN: 57 mL/min — AB (ref >60)
GLUCOSE: 96 mg/dL (ref 65–99)
POTASSIUM: 3.2 mmol/L — AB (ref 3.5–5.1)
Sodium: 144 mmol/L (ref 135–145)

## 2016-01-06 LAB — CULTURE, BLOOD (ROUTINE X 2)

## 2016-01-06 LAB — URINE CULTURE

## 2016-01-06 LAB — MAGNESIUM: Magnesium: 1.7 mg/dL (ref 1.7–2.4)

## 2016-01-06 MED ORDER — AMOXICILLIN-POT CLAVULANATE 500-125 MG PO TABS: 1.0000 | ORAL_TABLET | Freq: Three times a day (TID) | ORAL | Status: DC

## 2016-01-06 MED ORDER — LEVETIRACETAM 500 MG PO TABS: 500.0000 mg | ORAL_TABLET | Freq: Two times a day (BID) | ORAL | Status: AC

## 2016-01-06 MED ORDER — POTASSIUM CHLORIDE 20 MEQ/15ML (10%) PO SOLN
40.0000 meq | Freq: Once | ORAL | Status: AC
  Administered 2016-01-06: 40 meq via ORAL
  Filled 2016-01-06: qty 30

## 2016-01-06 MED ORDER — POTASSIUM CHLORIDE CRYS ER 20 MEQ PO TBCR: 40.0000 meq | EXTENDED_RELEASE_TABLET | ORAL | Status: DC

## 2016-01-06 NOTE — Discharge Instructions (Signed)
Follow with Primary MD Kandice Hams, MD in 7 days   Get CBC, CMP, 2 view Chest X ray checked  by Primary MD next visit.    Activity: As tolerated with Full fall precautions use walker/cane & assistance as needed   Disposition SNF   Diet:   Dysphagia 3 with feeding assistance and aspiration precautions.  For Heart failure patients - Check your Weight same time everyday, if you gain over 2 pounds, or you develop in leg swelling, experience more shortness of breath or chest pain, call your Primary MD immediately. Follow Cardiac Low Salt Diet and 1.5 lit/day fluid restriction.   On your next visit with your primary care physician please Get Medicines reviewed and adjusted.   Please request your Prim.MD to go over all Hospital Tests and Procedure/Radiological results at the follow up, please get all Hospital records sent to your Prim MD by signing hospital release before you go home.   If you experience worsening of your admission symptoms, develop shortness of breath, life threatening emergency, suicidal or homicidal thoughts you must seek medical attention immediately by calling 911 or calling your MD immediately  if symptoms less severe.  You Must read complete instructions/literature along with all the possible adverse reactions/side effects for all the Medicines you take and that have been prescribed to you. Take any new Medicines after you have completely understood and accpet all the possible adverse reactions/side effects.   Do not drive, operating heavy machinery, perform activities at heights, swimming or participation in water activities or provide baby sitting services if your were admitted for syncope or siezures until you have seen by Primary MD or a Neurologist and advised to do so again.  Do not drive when taking Pain medications.    Do not take more than prescribed Pain, Sleep and Anxiety Medications  Special Instructions: If you have smoked or chewed Tobacco  in the  last 2 yrs please stop smoking, stop any regular Alcohol  and or any Recreational drug use.  Wear Seat belts while driving.   Please note  You were cared for by a hospitalist during your hospital stay. If you have any questions about your discharge medications or the care you received while you were in the hospital after you are discharged, you can call the unit and asked to speak with the hospitalist on call if the hospitalist that took care of you is not available. Once you are discharged, your primary care physician will handle any further medical issues. Please note that NO REFILLS for any discharge medications will be authorized once you are discharged, as it is imperative that you return to your primary care physician (or establish a relationship with a primary care physician if you do not have one) for your aftercare needs so that they can reassess your need for medications and monitor your lab values.

## 2016-01-06 NOTE — Discharge Summary (Signed)
[name], is a 65 y.o. female  DOB Apr 18, 1951  MRN [id number].  Admission date:  01/03/2016  Admitting Physician  Waldemar Dickens, MD  Discharge Date:  01/06/2016   Primary MD  Kandice Hams, MD  Recommendations for primary care physician for things to follow:   Repeat 2 sets of blood cultures in one week from discharge date.  Check CBC, BMP in 1 week.  Monitor weight, edema and address the need for diuretics.   Admission Diagnosis  Seizure ([place]) [R56.9] HCAP (healthcare-associated pneumonia) [J18.9]   Discharge Diagnosis  Seizure ([place]) [R56.9] HCAP (healthcare-associated pneumonia) [J18.9]     Active Problems:   A-fib ([place])   HCAP (healthcare-associated pneumonia)   Seizure ([place])   Aspiration pneumonia ([place])   Essential hypertension   End of life care   Hyperglycemia   CKD (chronic kidney disease)   Goals of care, counseling/discussion   Encounter for palliative care      Past Medical History  Diagnosis Date  . Asthma   . Bronchitis   . Allergic rhinitis   . HTN (hypertension)   . Hyperlipidemia   . Obesity   . OSA (obstructive sleep apnea)     poor compliance with cpap  . CHF (congestive heart failure) ([place])   . Biventricular failure ([place])     compensated  . Psoriasis   . Stasis edema     of lower extremities  . Depression   . Situational stress   . Anemia   . Gout   . Chronic anticoagulation     off due to GIB, thigh hematoma  . Yeast infection involving the vagina and surrounding area   . Atrial fibrillation with RVR ([place])   . GERD (gastroesophageal reflux disease)   . Chronic bronchitis ([place])     "get it q yr" (08/10/2013)  . Shortness of breath     "all the time" (08/10/2013)  . On home oxygen therapy     "2L during the night and prn during the day" (08/10/2013)  .  Type II diabetes mellitus ([place])   . History of blood transfusion     "few during my lifetime; last time was 4 days ago when I got 2 units" (08/10/2013)  . Stroke [facility]) ?2007    denies residuals on 08/10/2013  . DJD (degenerative joint disease)   . Bleeding     "from my skin folds; just won't stop" (08/10/2013)  . Complication of anesthesia     "they gave me too much; I stayed out of it for awhile" (08/10/2013)  . Anxiety   . Kidney stones   . Kidney disease, chronic, stage III (GFR 30-59 ml/min)   . Chronic kidney disease, stage IV (severe) ([place])   . Morbid obesity ([place])   . Diastolic CHF ([place])   . Pneumonia     "said I did on 08/03/2013 then they ruled it out" (08/10/2013)  . HCAP (healthcare-associated pneumonia) 01/03/2016    Past Surgical History  Procedure Laterality Date  . Appendectomy    .  Cholecystectomy    . Total knee arthroplasty Left ~ 2006  . Total abdominal hysterectomy    . Back surgery    . Cystectomy Left     hand  . Tonsillectomy    . Joint replacement    . Lumbar disc surgery    . Colonoscopy N/A 08/16/2013    Procedure: COLONOSCOPY;  Surgeon: Beryle Beams, MD;  Location: [facility];  Service: Endoscopy;  Laterality: N/A;  . Hematoma evacuation Right 09/10/2013    Procedure: EVACUATION OF RIGHT LEG HEMATOMA AND EXCISION [place] RIGHT LEG ;  Surgeon: Merrie Roof, MD;  Location: [facility];  Service: General;  Laterality: Right;  . Debridement and closure wound Right 09/22/2013    Procedure: IRRIGATION AND DEBRIDEMENT SURGICAL PREP PLACEMENT OF [place]  ;  Surgeon: Irene Limbo, MD;  Location: [facility];  Service: Plastics;  Laterality: Right;  . Skin split graft Right 10/07/2013    Procedure: SPLIT THICKNESS SKIN GRAFT RIGHT THIGH TO RIGHT LEG, PLACEMENT OF A CEL AND WOUND VAC TO RIGHT THIGH  ;  Surgeon: Irene Limbo, MD;  Location: [facility];  Service: Plastics;  Laterality: Right;       HPI  from the history and physical done on the day of  admission:   [name] is a 65 y.o. female Level V caveat: Patient presenting in post ictal state and unable to provide history. History provided on limited basis by EDP, husband, nursing home facility staff.  Per report by daytime nurse at [facility] patient was in a persistent state of seizure as of 8 AM. As of report given to her patient had intermittent seizures for an unknown amount of time prior to the start of her shift which by description sounds like it was over an hour. 2 doses of Ativan were given at the nursing home with out any benefit. There is considerable discussion had between patient's husband and sons who were debating on allowing patient to stay at the nursing home and continued to seize and/or pass versus bringing her to the emergency room for treatment. The decision was eventually made to bring patient to the emergency room. An additional dose of Ativan was given by EMS and wrote to [facility] emergency room at which time patient stopped seizing at time of arrival at approximately 9 AM. Patient has been in her normal state of health prior to this event.   Of note patient is a long term resident at nursing home due to multiple physical/musculoskeletal conditions and physical deconditioning.     Hospital Course:     1. Aspiration pneumonia in a healthcare setting. Monitor cultures, aspiration precautions, speech following currently on dysphagia 3 diet with feeding assistance and aspiration precautions, and switch her to oral Augmentin for 7 more days and discharged to SNF, continue feeding assistance and aspiration precautions she is overall much improved and now close to baseline.   2. Severe dehydration with hypernatremia, ARF on CK D3. Resolved after hydration with IV fluids, creatinine now better than what it has been in the last several months.   3. 1 out of 2 blood culture positive for enterococci and coag-negative staph. Discussed with ID physician  Dr. Alton Revere, she will be placed on 7 more days of oral Augmentin, thereafter repeat blood cultures, if they remain positive please refer the patient back to ER.[name]   4. Klebsiella/Escherichia coli UTI. Treated with Unasyn. Now switched to Augmentin as above.   5.  New onset seizures. Head CT nonacute, could be due to metabolic encephalopathy, neuro on board, placed on Keppra. Now seizure free. Outpatient neuro follow-up in 2-3 weeks.   6. Deconditioning, chronically bedbound, poor quality of life. Family wants gentle medical treatment. If declines full comfort care. Will involve palliative care as well. Husband on board. Currently DO NOT RESUSCITATE. Desires gentle medical treatment only.   7.Chr.Diastolic Ef [id number] - was extremely dehydrated upon admission needed D5W, holding diuretic upon discharge.   8.PAfib: no anticoagulation per family. Initially in RVR. CHADSVASC score 6, - continue beta blocker for rate control. May consider 81 mg of aspirin if no other contraindications.   9.HTN - continue home regimen.   10. Questionable diabetes mellitus. Doubt diagnosis, A1c 4.7.    Discharge Condition:  Fair  Follow UP  Follow-up Information    Follow up with POLITE,RONALD D, MD. Schedule an appointment as soon as possible for a visit in 1 week.   Specialty:  Internal Medicine   Contact information:   [address] [place] [address] [phone]        Consults obtained Berlinda Last care, Neuro  Diet and Activity recommendation: See Discharge Instructions below  Discharge Instructions           Discharge Instructions    Discharge instructions    Complete by:  As directed   Follow with Primary MD Kandice Hams, MD in 7 days   Get CBC, CMP, 2 view Chest X ray checked  by Primary MD next visit.    Activity: As tolerated with Full fall precautions use walker/cane & assistance as needed   Disposition SNF   Diet:   Dysphagia 3 with feeding assistance and  aspiration precautions.  For Heart failure patients - Check your Weight same time everyday, if you gain over 2 pounds, or you develop in leg swelling, experience more shortness of breath or chest pain, call your Primary MD immediately. Follow Cardiac Low Salt Diet and 1.5 lit/day fluid restriction.   On your next visit with your primary care physician please Get Medicines reviewed and adjusted.   Please request your Prim.MD to go over all Hospital Tests and Procedure/Radiological results at the follow up, please get all Hospital records sent to your Prim MD by signing hospital release before you go home.   If you experience worsening of your admission symptoms, develop shortness of breath, life threatening emergency, suicidal or homicidal thoughts you must seek medical attention immediately by calling 911 or calling your MD immediately  if symptoms less severe.  You Must read complete instructions/literature along with all the possible adverse reactions/side effects for all the Medicines you take and that have been prescribed to you. Take any new Medicines after you have completely understood and accpet all the possible adverse reactions/side effects.   Do not drive, operating heavy machinery, perform activities at heights, swimming or participation in water activities or provide baby sitting services if your were admitted for syncope or siezures until you have seen by Primary MD or a Neurologist and advised to do so again.  Do not drive when taking Pain medications.    Do not take more than prescribed Pain, Sleep and Anxiety Medications  Special Instructions: If you have smoked or chewed Tobacco  in the last 2 yrs please stop smoking, stop any regular Alcohol  and or any Recreational drug use.  Wear Seat belts while driving.   Please note  You were cared for by  a hospitalist during your hospital stay. If you have any questions about your discharge medications or the care you received  while you were in the hospital after you are discharged, you can call the unit and asked to speak with the hospitalist on call if the hospitalist that took care of you is not available. Once you are discharged, your primary care physician will handle any further medical issues. Please note that NO REFILLS for any discharge medications will be authorized once you are discharged, as it is imperative that you return to your primary care physician (or establish a relationship with a primary care physician if you do not have one) for your aftercare needs so that they can reassess your need for medications and monitor your lab values.     Increase activity slowly    Complete by:  As directed              Discharge Medications       Medication List    STOP taking these medications        oxycodone 5 MG capsule  Commonly known as:  OXY-IR     torsemide 20 MG tablet  Commonly known as:  DEMADEX      TAKE these medications        acetaminophen 500 MG tablet  Commonly known as:  TYLENOL  Take 500 mg by mouth every 6 (six) hours.     albuterol (2.5 MG/3ML) 0.083% nebulizer solution  Commonly known as:  PROVENTIL  Take 2.5 mg by nebulization every 6 (six) hours as needed for wheezing or shortness of breath.     allopurinol 100 MG tablet  Commonly known as:  ZYLOPRIM  Take 100 mg by mouth daily. Patient takes at 0800am daily     amLODipine 10 MG tablet  Commonly known as:  NORVASC  Take 10 mg by mouth at bedtime.     amoxicillin-clavulanate 500-125 MG tablet  Commonly known as:  AUGMENTIN  Take 1 tablet (500 mg total) by mouth 3 (three) times daily. For 7 more days     atorvastatin 20 MG tablet  Commonly known as:  LIPITOR  Take 20 mg by mouth at bedtime. Patient takes at 2100     cyanocobalamin 1000 MCG/ML injection  Commonly known as:  (VITAMIN B-12)  Inject 1,000 mcg into the muscle every 30 (thirty) days.     feeding supplement Liqd  Take 1 Container by mouth 2 (two)  times daily between meals.     ferrous sulfate 325 (65 FE) MG tablet  Take 325 mg by mouth 3 (three) times daily with meals.     hydrALAZINE 100 MG tablet  Commonly known as:  APRESOLINE  Take 100 mg by mouth 3 (three) times daily.     hydrALAZINE 25 MG tablet  Commonly known as:  APRESOLINE  Take 5 tablets (125 mg total) by mouth 3 (three) times daily.     isosorbide dinitrate 30 MG tablet  Commonly known as:  ISORDIL  Take 1 tablet (30 mg total) by mouth 2 (two) times daily.     levETIRAcetam 500 MG tablet  Commonly known as:  KEPPRA  Take 1 tablet (500 mg total) by mouth 2 (two) times daily.     metoCLOPramide 5 MG tablet  Commonly known as:  REGLAN  Take 5 mg by mouth 3 (three) times daily.     metoprolol tartrate 25 MG tablet  Commonly known as:  LOPRESSOR  Take 6.25 mg by  mouth 2 (two) times daily.     omeprazole 20 MG capsule  Commonly known as:  PRILOSEC  Take 20 mg by mouth 2 (two) times daily before a meal.     OXYGEN  Inhale 2 L into the lungs continuous.     polyethylene glycol packet  Commonly known as:  MIRALAX / GLYCOLAX  Take 17 g by mouth 2 (two) times daily.     SYSTANE 0.4-0.3 % Soln  Generic drug:  Polyethyl Glycol-Propyl Glycol  Apply 1 drop to eye 4 (four) times daily.     traZODone 50 MG tablet  Commonly known as:  DESYREL  Take 50 mg by mouth at bedtime.     Vitamin D (Ergocalciferol) 50000 units Caps capsule  Commonly known as:  DRISDOL  Take 50,000 Units by mouth 3 (three) times a week.        Major procedures and Radiology Reports - PLEASE review detailed and final reports for all details, in brief -    Ct Head Wo Contrast  01/03/2016  CLINICAL DATA:  History is seizure for 2 hours before coming to the emergency department. EXAM: CT HEAD WITHOUT CONTRAST TECHNIQUE: Contiguous axial images were obtained from the base of the skull through the vertex without intravenous contrast. COMPARISON:  10/08/2009 FINDINGS: There is no evidence  of mass effect, midline shift, or extra-axial fluid collections. There is no evidence of a space-occupying lesion or intracranial hemorrhage. There is no evidence of a cortical-based area of acute infarction. 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 There is a small old left parietal lobe infarct with encephalomalacia. There is generalized cerebral atrophy. There is periventricular white matter low attenuation likely secondary to microangiopathy. The ventricles and sulci are appropriate for the patient's age. The basal cisterns are patent. Visualized portions of the orbits are unremarkable. The visualized portions of the paranasal sinuses and mastoid air cells are unremarkable. Cerebrovascular atherosclerotic calcifications are noted. The osseous structures are unremarkable. IMPRESSION: No acute intracranial pathology. Electronically Signed   By: Kathreen Devoid   On: 01/03/2016 10:14   Dg Chest Port 1 View  01/03/2016  CLINICAL DATA:  Seizures since 7 a.m. today. EXAM: PORTABLE CHEST 1 VIEW COMPARISON:  01/25/2014. FINDINGS: Poor inspiration. Enlarged cardiac silhouette with overall improvement. Mild patchy opacity in the right upper lobe. Prominent interstitial markings elsewhere in both lungs. Thoracic spine degenerative changes. Cholecystectomy clips. IMPRESSION: 1. Mild patchy pneumonia or aspiration pneumonitis in the right upper lobe. 2. Cardiomegaly and chronic interstitial lung disease. Electronically Signed   By: Claudie Revering M.D.   On: 01/03/2016 10:18    Micro Results     Recent Results (from the past 240 hour(s))  Urine culture     Status: None   Collection Time: 01/03/16  9:45 AM  Result Value Ref Range Status   Specimen Description URINE, CATHETERIZED  Final   Special Requests NONE  Final   Culture   Final    70,000 COLONIES/ml ESCHERICHIA COLI 30,000 COLONIES/mL KLEBSIELLA PNEUMONIAE    Report Status 01/06/2016 FINAL  Final   Organism ID, Bacteria  KLEBSIELLA PNEUMONIAE  Final   Organism ID, Bacteria ESCHERICHIA COLI  Final      Susceptibility   Escherichia coli - MIC*    AMPICILLIN >=32 RESISTANT Resistant     CEFAZOLIN >=64 RESISTANT Resistant     CEFTRIAXONE <=1 SENSITIVE Sensitive     CIPROFLOXACIN >=4 RESISTANT Resistant     GENTAMICIN <=1 SENSITIVE Sensitive  IMIPENEM <=0.25 SENSITIVE Sensitive     NITROFURANTOIN <=16 SENSITIVE Sensitive     TRIMETH/SULFA <=20 SENSITIVE Sensitive     AMPICILLIN/SULBACTAM 16 INTERMEDIATE Intermediate     PIP/TAZO <=4 SENSITIVE Sensitive     * 70,000 COLONIES/ml ESCHERICHIA COLI   Klebsiella pneumoniae - MIC*    AMPICILLIN >=32 RESISTANT Resistant     CEFAZOLIN <=4 SENSITIVE Sensitive     CEFTRIAXONE <=1 SENSITIVE Sensitive     CIPROFLOXACIN <=0.25 SENSITIVE Sensitive     GENTAMICIN <=1 SENSITIVE Sensitive     IMIPENEM <=0.25 SENSITIVE Sensitive     NITROFURANTOIN 64 INTERMEDIATE Intermediate     TRIMETH/SULFA <=20 SENSITIVE Sensitive     AMPICILLIN/SULBACTAM 4 SENSITIVE Sensitive     PIP/TAZO <=4 SENSITIVE Sensitive     * 30,000 COLONIES/mL KLEBSIELLA PNEUMONIAE  Blood culture (routine x 2)     Status: None   Collection Time: 01/03/16 10:50 AM  Result Value Ref Range Status   Specimen Description BLOOD LEFT ANTECUBITAL  Final   Special Requests BOTTLES DRAWN AEROBIC AND ANAEROBIC 5CC  Final   Culture  Setup Time   Final    GRAM POSITIVE COCCI IN BOTH AEROBIC AND ANAEROBIC BOTTLES CRITICAL RESULT CALLED TO, READ BACK BY AND VERIFIED WITH: KBROWN,RN @0431  01/04/16 MKELLY    Culture   Final    ENTEROCOCCUS SPECIES STAPHYLOCOCCUS SPECIES (COAGULASE NEGATIVE) THE SIGNIFICANCE OF ISOLATING THIS ORGANISM FROM A SINGLE SET OF BLOOD CULTURES WHEN MULTIPLE SETS ARE DRAWN IS UNCERTAIN. PLEASE NOTIFY THE MICROBIOLOGY DEPARTMENT WITHIN ONE WEEK IF SPECIATION AND SENSITIVITIES ARE REQUIRED.    Report Status 01/06/2016 FINAL  Final   Organism ID, Bacteria ENTEROCOCCUS SPECIES  Final       Susceptibility   Enterococcus species - MIC*    AMPICILLIN <=2 SENSITIVE Sensitive     VANCOMYCIN 1 SENSITIVE Sensitive     GENTAMICIN SYNERGY SENSITIVE Sensitive     LINEZOLID 2 SENSITIVE Sensitive     * ENTEROCOCCUS SPECIES  Blood culture (routine x 2)     Status: None (Preliminary result)   Collection Time: 01/03/16 10:54 AM  Result Value Ref Range Status   Specimen Description BLOOD BLOOD LEFT FOREARM  Final   Special Requests BOTTLES DRAWN AEROBIC AND ANAEROBIC 5CC  Final   Culture NO GROWTH 2 DAYS  Final   Report Status PENDING  Incomplete  MRSA PCR Screening     Status: None   Collection Time: 01/03/16  6:02 PM  Result Value Ref Range Status   MRSA by PCR NEGATIVE NEGATIVE Final    Comment:        The GeneXpert MRSA Assay (FDA approved for NASAL specimens only), is one component of a comprehensive MRSA colonization surveillance program. It is not intended to diagnose MRSA infection nor to guide or monitor treatment for MRSA infections.     Today   Subjective   [name] today has no headache,no chest abdominal pain,no new weakness tingling or numbness, feels much better wants to go home today.     Objective   Blood pressure 179/87, pulse 106, temperature 98.7 F (37.1 C), temperature source Oral, resp. rate 19, height 5' 2.99" (1.6 m), weight 101.2 kg (223 lb 1.7 oz), SpO2 97 %.   Intake/Output Summary (Last 24 hours) at 01/06/16 0916 Last data filed at 01/06/16 0528  Gross per 24 hour  Intake     55 ml  Output   1040 ml  Net   -985 ml    Exam Awake  Alert, Oriented x 3, No new F.N deficits, Normal affect [place].AT,PERRAL Supple Neck,No JVD, No cervical lymphadenopathy appriciated.  Symmetrical Chest wall movement, Good air movement bilaterally, CTAB RRR,No Gallops,Rubs or new Murmurs, No Parasternal Heave +ve B.Sounds, Abd Soft, Non tender, No organomegaly appriciated, No rebound -guarding or rigidity. No Cyanosis, Clubbing or edema, No new Rash or  bruise   Data Review   CBC w Diff:  Lab Results  Component Value Date   WBC 7.2 01/05/2016   HGB 8.8* 01/05/2016   HCT 28.0* 01/05/2016   PLT 140* 01/05/2016   LYMPHOPCT 8 01/03/2016   MONOPCT 2 01/03/2016   EOSPCT 0 01/03/2016   BASOPCT 0 01/03/2016    CMP:  Lab Results  Component Value Date   NA 144 01/06/2016   K 3.2* 01/06/2016   CL 113* 01/06/2016   CO2 25 01/06/2016   BUN 37* 01/06/2016   CREATININE 1.15* 01/06/2016   CREATININE 2.65* 07/01/2013   PROT 5.3* 01/04/2016   ALBUMIN 2.0* 01/04/2016   BILITOT 0.5 01/04/2016   ALKPHOS 75 01/04/2016   AST 16 01/04/2016   ALT 10* 01/04/2016  .   Total Time in preparing paper work, data evaluation and todays exam - 35 minutes  Thurnell Lose M.D on 01/06/2016 at 9:16 AM  [facility]   Office  [phone]

## 2016-01-06 NOTE — Progress Notes (Signed)
Patient will DC to: [facility] Anticipated DC date: 01/06/16 Family notified: Husband Transport by: [facility]  CSW signing off.  Cedric Fishman, [place] Social Worker [phone]

## 2016-01-08 LAB — CULTURE, BLOOD (ROUTINE X 2): CULTURE: NO GROWTH

## 2016-02-23 ENCOUNTER — Ambulatory Visit (INDEPENDENT_AMBULATORY_CARE_PROVIDER_SITE_OTHER): Admitting: Diagnostic Neuroimaging

## 2016-02-23 ENCOUNTER — Encounter: Payer: Self-pay | Admitting: Diagnostic Neuroimaging

## 2016-02-23 VITALS — BP 121/49 | HR 72

## 2016-02-23 DIAGNOSIS — I6789 Other cerebrovascular disease: Secondary | ICD-10-CM

## 2016-02-23 DIAGNOSIS — I10 Essential (primary) hypertension: Secondary | ICD-10-CM

## 2016-02-23 DIAGNOSIS — I480 Paroxysmal atrial fibrillation: Secondary | ICD-10-CM | POA: Diagnosis not present

## 2016-02-23 DIAGNOSIS — G40909 Epilepsy, unspecified, not intractable, without status epilepticus: Secondary | ICD-10-CM

## 2016-02-23 NOTE — Patient Instructions (Signed)
-   continue levetiracetam 500mg twice a day 

## 2016-02-23 NOTE — Progress Notes (Signed)
[facility]  PATIENT: [name] DOB: 1951-01-09  REFERRING CLINICIAN: P Singh HISTORY FROM: patient and care giver and chart review REASON FOR VISIT: new consult    HISTORICAL  CHIEF COMPLAINT:  Chief Complaint  Patient presents with  . Seizures    rm 7, New Pt, lives at [facility], [place], hospital referral    HISTORY OF PRESENT ILLNESS:   65 year old female with hypertension, hyperlipidemia, obstructive sleep apnea, congestive heart failure, atrial fibrillation, chronic kidney disease, diabetes, stroke, general functional decline, here for evaluation of new onset seizure. Patient was admitted to the hospital on 01/03/16 for altered mental status, then had witnessed seizures in the hospital emergency room. Patient was evaluated with neuro imaging, EEG, lab testing. Patient was started on levetiracetam 500 mg twice a day. No further seizures. Since that time patient has been discharged back to skilled nursing facility. No family history of seizure. Patient did have stroke in the past with left-sided vision loss but fairly good recovery. No other specific triggering or aggravating factors.    REVIEW OF SYSTEMS: Full 14 system review of systems performed and negative with exception of: Fevers chills fatigue itching blurred vision [place] of breath cough anemia easy bruising feeling cold increased thirst as well as long seizure insomnia restless legs anxiety too much sleep allergies runny nose constipation disinterest in activities.  ALLERGIES: Allergies  Allergen Reactions  . Daptomycin Other (See Comments)    Elevated CPK  . Morphine And Related Nausea And Vomiting  . Cephalexin Rash  . Tramadol Nausea And Vomiting  . Atenolol     unknown  . Cephalosporins Other (See Comments)    Unknown  . Dapsone Other (See Comments)    unknown  . Erythromycin     unknown  . Erythromycin Base Other (See Comments)    Unknown  . Morphine Nausea  And Vomiting  . Celecoxib Other (See Comments)    Unknown, can use furosemide without issue  . Cephalexin Rash  . Codeine Other (See Comments)    unknown  . Fluoxetine Hcl Other (See Comments)    unknown  . Latex Other (See Comments)  . Ofloxacin Other (See Comments)    unknown  . Penicillins Other (See Comments)    Unknown  Has patient had a PCN reaction causing immediate rash, facial/tongue/throat swelling, SOB or lightheadedness with hypotension: unknown Has patient had a PCN reaction causing severe rash involving mucus membranes or skin necrosis: unknown Has patient had a PCN reaction that required hospitalization unknown Has patient had a PCN reaction occurring within the last 10 years: unknown If all of the above answers are "NO", then may proceed with Cephalosporin use.  [name] Rofecoxib Other (See Comments)    unknown  . Sulfonamide Derivatives Other (See Comments)    unknown    HOME MEDICATIONS: Outpatient Prescriptions Prior to Visit  Medication Sig Dispense Refill  . acetaminophen (TYLENOL) 500 MG tablet Take 500 mg by mouth every 6 (six) hours.    [name] albuterol (PROVENTIL) (2.5 MG/3ML) 0.083% nebulizer solution Take 2.5 mg by nebulization every 6 (six) hours as needed for wheezing or shortness of breath.    . allopurinol (ZYLOPRIM) 100 MG tablet Take 100 mg by mouth daily. Patient takes at 0800am daily    . amLODipine (NORVASC) 10 MG tablet Take 10 mg by mouth at bedtime.     [name] atorvastatin (LIPITOR) 20 MG tablet Take 20 mg by mouth at bedtime. Patient takes at 2100    .  cyanocobalamin (,VITAMIN B-12,) 1000 MCG/ML injection Inject 1,000 mcg into the muscle every 30 (thirty) days.    . ferrous sulfate 325 (65 FE) MG tablet Take 325 mg by mouth 3 (three) times daily with meals.    . hydrALAZINE (APRESOLINE) 100 MG tablet Take 100 mg by mouth 3 (three) times daily.    . hydrALAZINE (APRESOLINE) 25 MG tablet Take 5 tablets (125 mg total) by mouth 3 (three) times daily. (Patient  taking differently: Take 25 mg by mouth 3 (three) times daily. ) 90 tablet 0  . isosorbide dinitrate (ISORDIL) 30 MG tablet Take 1 tablet (30 mg total) by mouth 2 (two) times daily.    [name] levETIRAcetam (KEPPRA) 500 MG tablet Take 1 tablet (500 mg total) by mouth 2 (two) times daily.    . metoCLOPramide (REGLAN) 5 MG tablet Take 5 mg by mouth 3 (three) times daily.    . metoprolol tartrate (LOPRESSOR) 25 MG tablet Take 6.25 mg by mouth 2 (two) times daily.     [name] omeprazole (PRILOSEC) 20 MG capsule Take 20 mg by mouth 2 (two) times daily before a meal.     . OXYGEN Inhale 2 L into the lungs continuous.    Vladimir Faster Glycol-Propyl Glycol (SYSTANE) 0.4-0.3 % SOLN Apply 1 drop to eye 4 (four) times daily.    . polyethylene glycol (MIRALAX / GLYCOLAX) packet Take 17 g by mouth 2 (two) times daily.    . traZODone (DESYREL) 50 MG tablet Take 50 mg by mouth at bedtime.     . Vitamin D, Ergocalciferol, (DRISDOL) 50000 units CAPS capsule Take 50,000 Units by mouth 3 (three) times a week.    . feeding supplement (BOOST / RESOURCE BREEZE) LIQD Take 1 Container by mouth 2 (two) times daily between meals. Reported on 02/23/2016    . amoxicillin-clavulanate (AUGMENTIN) 500-125 MG tablet Take 1 tablet (500 mg total) by mouth 3 (three) times daily. For 7 more days     No facility-administered medications prior to visit.    PAST MEDICAL HISTORY: Past Medical History  Diagnosis Date  . Asthma   . Bronchitis   . Allergic rhinitis   . HTN (hypertension)   . Hyperlipidemia   . Obesity   . OSA (obstructive sleep apnea)     poor compliance with cpap  . CHF (congestive heart failure) ([place])   . Biventricular failure ([place])     compensated  . Psoriasis   . Stasis edema     of lower extremities  . Depression   . Situational stress   . Anemia   . Gout   . Chronic anticoagulation     off due to GIB, thigh hematoma  . Yeast infection involving the vagina and surrounding area   . Atrial fibrillation with RVR  ([place])   . GERD (gastroesophageal reflux disease)   . Chronic bronchitis ([place])     "get it q yr" (08/10/2013)  . Shortness of breath     "all the time" (08/10/2013)  . On home oxygen therapy     "2L during the night and prn during the day" (08/10/2013)  . Type II diabetes mellitus ([place])   . History of blood transfusion     "few during my lifetime; last time was 4 days ago when I got 2 units" (08/10/2013)  . Stroke [facility]) ?2007    denies residuals on 08/10/2013  . DJD (degenerative joint disease)   . Bleeding     "from my skin folds; just  won't stop" (08/10/2013)  . Complication of anesthesia     "they gave me too much; I stayed out of it for awhile" (08/10/2013)  . Anxiety   . Kidney stones   . Kidney disease, chronic, stage III (GFR 30-59 ml/min)   . Chronic kidney disease, stage IV (severe) ([place])   . Morbid obesity ([place])   . Diastolic CHF ([place])   . Pneumonia     "said I did on 08/03/2013 then they ruled it out" (08/10/2013)  . HCAP (healthcare-associated pneumonia) 01/03/2016  . Seizures ([place])     PAST SURGICAL HISTORY: Past Surgical History  Procedure Laterality Date  . Appendectomy    . Cholecystectomy    . Total knee arthroplasty Left ~ 2006  . Total abdominal hysterectomy    . Back surgery    . Cystectomy Left     hand  . Tonsillectomy    . Joint replacement    . Lumbar disc surgery    . Colonoscopy N/A 08/16/2013    Procedure: COLONOSCOPY;  Surgeon: Beryle Beams, MD;  Location: [facility];  Service: Endoscopy;  Laterality: N/A;  . Hematoma evacuation Right 09/10/2013    Procedure: EVACUATION OF RIGHT LEG HEMATOMA AND EXCISION [place] RIGHT LEG ;  Surgeon: Merrie Roof, MD;  Location: [facility];  Service: General;  Laterality: Right;  . Debridement and closure wound Right 09/22/2013    Procedure: IRRIGATION AND DEBRIDEMENT SURGICAL PREP PLACEMENT OF [place]  ;  Surgeon: Irene Limbo, MD;  Location: [facility];  Service: Plastics;  Laterality: Right;  . Skin split graft  Right 10/07/2013    Procedure: SPLIT THICKNESS SKIN GRAFT RIGHT THIGH TO RIGHT LEG, PLACEMENT OF A CEL AND WOUND VAC TO RIGHT THIGH  ;  Surgeon: Irene Limbo, MD;  Location: [facility];  Service: Plastics;  Laterality: Right;    FAMILY HISTORY: Family History  Problem Relation Age of Onset  . Heart attack Father   . Asthma Father   . Heart disease Paternal Uncle   . Rectal cancer Paternal Aunt   . Other Mother     mva    SOCIAL HISTORY:  Social History   Social History  . Marital Status: Married    Spouse Name: N/A  . Number of Children: N/A  . Years of Education: N/A   Occupational History  . disabled    Social History Main Topics  . Smoking status: Former Smoker -- 0.12 packs/day for 12 years    Types: Cigarettes    Quit date: 07/05/1982  . Smokeless tobacco: Former Systems developer     Comment: smoked for 1.5 yrs about 2 cigs a day ,only if stressed  . Alcohol Use: No  . Drug Use: No  . Sexual Activity: No   Other Topics Concern  . Not on file   Social History Narrative     PHYSICAL EXAM  GENERAL EXAM/CONSTITUTIONAL: Vitals:  Filed Vitals:   02/23/16 0921  BP: 121/49  Pulse: 72     There is no weight on file to calculate BMI.  No exam data present  Patient is in no distress; FRAIL APPEARING; IN WHEELCHAIR; INTERMITTENT PURSED LIP BREATHING; SOFT SPOKEN; well developed, nourished; neck is supple  CARDIOVASCULAR:  Examination of carotid arteries is normal; no carotid bruits  Regular rate and rhythm, no murmurs  Examination of peripheral vascular system by observation and palpation is normal  EYES:  Ophthalmoscopic exam of optic discs and posterior segments is normal; no papilledema or  hemorrhages  MUSCULOSKELETAL:  Gait, strength, tone, movements noted in Neurologic exam below  NEUROLOGIC: MENTAL STATUS:  No flowsheet data found.  awake, alert, oriented to person, place and time  recent and remote memory intact  normal attention and  concentration  DECR FLUENCY; comprehension intact, naming intact,   fund of knowledge appropriate  CRANIAL NERVE:   2nd - no papilledema on fundoscopic exam  2nd, 3rd, 4th, 6th - pupils equal and reactive to light, visual fields full to confrontation, extraocular muscles intact; SACCADIC BREAKDOWN OF SMOOTH PURSUIT;no nystagmus  5th - facial sensation symmetric  7th - facial strength symmetric  8th - hearing intact  9th - palate elevates symmetrically, uvula midline  11th - shoulder shrug symmetric  12th - tongue protrusion midline  MOTOR:   normal bulk and tone; SLOW MOVEMENTS; BUE 3; BLE 2-3  SENSORY:   normal and symmetric to light touch; DECR VIB AT TOES  COORDINATION:   finger-nose-finger, fine finger movements --> SLOW  REFLEXES:   deep tendon reflexes TRACE IN BUE; ABSENT IN BLE  GAIT/STATION:   IN WHEEL CHAIR; CANNOT STAND UNASSISTED    DIAGNOSTIC DATA (LABS, IMAGING, TESTING) - I reviewed patient records, labs, notes, testing and imaging myself where available.  Lab Results  Component Value Date   WBC 7.2 01/05/2016   HGB 8.8* 01/05/2016   HCT 28.0* 01/05/2016   MCV 97.6 01/05/2016   PLT 140* 01/05/2016      Component Value Date/Time   NA 144 01/06/2016 0507   K 3.2* 01/06/2016 0507   CL 113* 01/06/2016 0507   CO2 25 01/06/2016 0507   GLUCOSE 96 01/06/2016 0507   BUN 37* 01/06/2016 0507   CREATININE 1.15* 01/06/2016 0507   CREATININE 2.65* 07/01/2013 1108   CALCIUM 8.4* 01/06/2016 0507   PROT 5.3* 01/04/2016 0551   ALBUMIN 2.0* 01/04/2016 0551   AST 16 01/04/2016 0551   ALT 10* 01/04/2016 0551   ALKPHOS 75 01/04/2016 0551   BILITOT 0.5 01/04/2016 0551   GFRNONAA 49* 01/06/2016 0507   GFRNONAA 19* 07/01/2013 1108   GFRAA 57* 01/06/2016 0507   GFRAA 21* 07/01/2013 1108   Lab Results  Component Value Date   CHOL 142 06/02/2011   HDL 55 06/02/2011   LDLCALC 71 06/02/2011   TRIG 107 01/24/2012   CHOLHDL 2.6 06/02/2011   Lab  Results  Component Value Date   HGBA1C 4.7* 01/03/2016   Lab Results  Component Value Date   VITAMINB12 230 11/17/2013   Lab Results  Component Value Date   TSH 2.378 01/04/2016    01/03/16 CT head [I reviewed images myself and agree with interpretation. -VRP]  - There is an old right occipital lobe infarct with encephalomalacia. There is a small old left parietal lobe infarct with encephalomalacia. There is generalized cerebral atrophy. There is periventricular white matter low attenuation likely secondary to microangiopathy. - No acute intracranial pathology.  01/03/16 EEG  - generalized non-specific cerebral dysfunction(encephalopathy). The presence of triphasic waves can be suggestive of a toxic/metabolic encephalopathy. There was no seizure or seizure predisposition recorded on this study.      ASSESSMENT AND PLAN  65 y.o. year old female here with new onset seizure in January 2017, likely related to scar tissue from prior stroke. Now on antiseizure medication. Patient stabilized and in fair condition at skilled nursing facility. Other medical conditions including hypertension and congestive heart failure being managed medically by PCP and cardiology.  Dx: post-stroke seizure  1. Seizure disorder ([place])  2. Essential hypertension   3. Paroxysmal atrial fibrillation ([facility])   4. C V A / STROKE      PLAN: - continue levetiracetam 500mg  BID  Return in about 6 months (around 08/25/2016).  I reviewed images, labs, notes (EPIC chart review), records myself. I summarized findings and reviewed with patient, for this high risk condition (seizure) requiring high complexity decision making.    Penni Bombard, MD [id number], 0000000 AM Certified in Neurology, Neurophysiology and Neuroimaging  [facility] [address], [place], [place] [address] [phone]

## 2016-06-22 ENCOUNTER — Observation Stay (HOSPITAL_COMMUNITY)

## 2016-06-22 ENCOUNTER — Emergency Department (HOSPITAL_COMMUNITY)

## 2016-06-22 ENCOUNTER — Encounter (HOSPITAL_COMMUNITY): Payer: Self-pay | Admitting: Emergency Medicine

## 2016-06-22 ENCOUNTER — Inpatient Hospital Stay (HOSPITAL_COMMUNITY)
Admission: EM | Admit: 2016-06-22 | Discharge: 2016-06-29 | DRG: 070 | Disposition: A | Discharge status: Skilled Nursing Facility | Attending: Internal Medicine | Admitting: Internal Medicine

## 2016-06-22 DIAGNOSIS — R0602 Shortness of breath: Secondary | ICD-10-CM | POA: Diagnosis present

## 2016-06-22 DIAGNOSIS — Z9981 Dependence on supplemental oxygen: Secondary | ICD-10-CM

## 2016-06-22 DIAGNOSIS — I471 Supraventricular tachycardia, unspecified: Secondary | ICD-10-CM | POA: Clinically undetermined

## 2016-06-22 DIAGNOSIS — Z90710 Acquired absence of both cervix and uterus: Secondary | ICD-10-CM

## 2016-06-22 DIAGNOSIS — I1310 Hypertensive heart and chronic kidney disease without heart failure, with stage 1 through stage 4 chronic kidney disease, or unspecified chronic kidney disease: Secondary | ICD-10-CM | POA: Diagnosis present

## 2016-06-22 DIAGNOSIS — Z7401 Bed confinement status: Secondary | ICD-10-CM

## 2016-06-22 DIAGNOSIS — N390 Urinary tract infection, site not specified: Secondary | ICD-10-CM | POA: Diagnosis present

## 2016-06-22 DIAGNOSIS — I313 Pericardial effusion (noninflammatory): Secondary | ICD-10-CM | POA: Diagnosis present

## 2016-06-22 DIAGNOSIS — Z6829 Body mass index (BMI) 29.0-29.9, adult: Secondary | ICD-10-CM

## 2016-06-22 DIAGNOSIS — N179 Acute kidney failure, unspecified: Secondary | ICD-10-CM | POA: Diagnosis present

## 2016-06-22 DIAGNOSIS — I3139 Other pericardial effusion (noninflammatory): Secondary | ICD-10-CM | POA: Diagnosis present

## 2016-06-22 DIAGNOSIS — N184 Chronic kidney disease, stage 4 (severe): Secondary | ICD-10-CM | POA: Diagnosis present

## 2016-06-22 DIAGNOSIS — R402212 Coma scale, best verbal response, none, at arrival to emergency department: Secondary | ICD-10-CM | POA: Diagnosis present

## 2016-06-22 DIAGNOSIS — E1122 Type 2 diabetes mellitus with diabetic chronic kidney disease: Secondary | ICD-10-CM | POA: Diagnosis present

## 2016-06-22 DIAGNOSIS — B961 Klebsiella pneumoniae [K. pneumoniae] as the cause of diseases classified elsewhere: Secondary | ICD-10-CM | POA: Diagnosis present

## 2016-06-22 DIAGNOSIS — I6783 Posterior reversible encephalopathy syndrome: Principal | ICD-10-CM | POA: Diagnosis present

## 2016-06-22 DIAGNOSIS — I639 Cerebral infarction, unspecified: Secondary | ICD-10-CM

## 2016-06-22 DIAGNOSIS — K219 Gastro-esophageal reflux disease without esophagitis: Secondary | ICD-10-CM | POA: Diagnosis present

## 2016-06-22 DIAGNOSIS — E785 Hyperlipidemia, unspecified: Secondary | ICD-10-CM | POA: Diagnosis present

## 2016-06-22 DIAGNOSIS — N183 Chronic kidney disease, stage 3 unspecified: Secondary | ICD-10-CM

## 2016-06-22 DIAGNOSIS — I480 Paroxysmal atrial fibrillation: Secondary | ICD-10-CM | POA: Diagnosis present

## 2016-06-22 DIAGNOSIS — J42 Unspecified chronic bronchitis: Secondary | ICD-10-CM | POA: Diagnosis present

## 2016-06-22 DIAGNOSIS — G40901 Epilepsy, unspecified, not intractable, with status epilepticus: Secondary | ICD-10-CM | POA: Diagnosis not present

## 2016-06-22 DIAGNOSIS — E440 Moderate protein-calorie malnutrition: Secondary | ICD-10-CM | POA: Insufficient documentation

## 2016-06-22 DIAGNOSIS — I130 Hypertensive heart and chronic kidney disease with heart failure and stage 1 through stage 4 chronic kidney disease, or unspecified chronic kidney disease: Secondary | ICD-10-CM | POA: Diagnosis present

## 2016-06-22 DIAGNOSIS — E870 Hyperosmolality and hypernatremia: Secondary | ICD-10-CM | POA: Diagnosis not present

## 2016-06-22 DIAGNOSIS — E662 Morbid (severe) obesity with alveolar hypoventilation: Secondary | ICD-10-CM | POA: Diagnosis present

## 2016-06-22 DIAGNOSIS — E11649 Type 2 diabetes mellitus with hypoglycemia without coma: Secondary | ICD-10-CM | POA: Diagnosis present

## 2016-06-22 DIAGNOSIS — R402312 Coma scale, best motor response, none, at arrival to emergency department: Secondary | ICD-10-CM | POA: Diagnosis present

## 2016-06-22 DIAGNOSIS — Z87891 Personal history of nicotine dependence: Secondary | ICD-10-CM

## 2016-06-22 DIAGNOSIS — R6889 Other general symptoms and signs: Secondary | ICD-10-CM

## 2016-06-22 DIAGNOSIS — T45515A Adverse effect of anticoagulants, initial encounter: Secondary | ICD-10-CM | POA: Diagnosis not present

## 2016-06-22 DIAGNOSIS — R4189 Other symptoms and signs involving cognitive functions and awareness: Secondary | ICD-10-CM | POA: Diagnosis present

## 2016-06-22 DIAGNOSIS — J45909 Unspecified asthma, uncomplicated: Secondary | ICD-10-CM | POA: Diagnosis present

## 2016-06-22 DIAGNOSIS — D62 Acute posthemorrhagic anemia: Secondary | ICD-10-CM | POA: Diagnosis not present

## 2016-06-22 DIAGNOSIS — R488 Other symbolic dysfunctions: Secondary | ICD-10-CM | POA: Diagnosis present

## 2016-06-22 DIAGNOSIS — M109 Gout, unspecified: Secondary | ICD-10-CM | POA: Diagnosis present

## 2016-06-22 DIAGNOSIS — Z9289 Personal history of other medical treatment: Secondary | ICD-10-CM

## 2016-06-22 DIAGNOSIS — R7881 Bacteremia: Secondary | ICD-10-CM | POA: Diagnosis present

## 2016-06-22 DIAGNOSIS — R0902 Hypoxemia: Secondary | ICD-10-CM | POA: Diagnosis present

## 2016-06-22 DIAGNOSIS — R402142 Coma scale, eyes open, spontaneous, at arrival to emergency department: Secondary | ICD-10-CM | POA: Diagnosis present

## 2016-06-22 DIAGNOSIS — Z96652 Presence of left artificial knee joint: Secondary | ICD-10-CM | POA: Diagnosis present

## 2016-06-22 DIAGNOSIS — N189 Chronic kidney disease, unspecified: Secondary | ICD-10-CM

## 2016-06-22 DIAGNOSIS — Z8673 Personal history of transient ischemic attack (TIA), and cerebral infarction without residual deficits: Secondary | ICD-10-CM

## 2016-06-22 DIAGNOSIS — I5042 Chronic combined systolic (congestive) and diastolic (congestive) heart failure: Secondary | ICD-10-CM | POA: Diagnosis present

## 2016-06-22 DIAGNOSIS — Z539 Procedure and treatment not carried out, unspecified reason: Secondary | ICD-10-CM | POA: Diagnosis present

## 2016-06-22 DIAGNOSIS — S8012XA Contusion of left lower leg, initial encounter: Secondary | ICD-10-CM | POA: Clinically undetermined

## 2016-06-22 LAB — URINALYSIS, ROUTINE W REFLEX MICROSCOPIC
BILIRUBIN URINE: NEGATIVE
Glucose, UA: 100 mg/dL — AB
KETONES UR: 15 mg/dL — AB
Leukocytes, UA: NEGATIVE
NITRITE: NEGATIVE
Specific Gravity, Urine: 1.018 (ref 1.005–1.030)
pH: 6 (ref 5.0–8.0)

## 2016-06-22 LAB — CBC WITH DIFFERENTIAL/PLATELET
Basophils Absolute: 0 10*3/uL (ref 0.0–0.1)
Basophils Relative: 0 %
EOS ABS: 0.2 10*3/uL (ref 0.0–0.7)
EOS PCT: 1 %
HCT: 32.9 % — ABNORMAL LOW (ref 36.0–46.0)
Hemoglobin: 10.2 g/dL — ABNORMAL LOW (ref 12.0–15.0)
LYMPHS ABS: 2 10*3/uL (ref 0.7–4.0)
Lymphocytes Relative: 16 %
MCH: 28.8 pg (ref 26.0–34.0)
MCHC: 31 g/dL (ref 30.0–36.0)
MCV: 92.9 fL (ref 78.0–100.0)
MONOS PCT: 3 %
Monocytes Absolute: 0.4 10*3/uL (ref 0.1–1.0)
Neutro Abs: 9.6 10*3/uL — ABNORMAL HIGH (ref 1.7–7.7)
Neutrophils Relative %: 80 %
PLATELETS: 231 10*3/uL (ref 150–400)
RBC: 3.54 MIL/uL — ABNORMAL LOW (ref 3.87–5.11)
RDW: 14.5 % (ref 11.5–15.5)
WBC: 12.1 10*3/uL — ABNORMAL HIGH (ref 4.0–10.5)

## 2016-06-22 LAB — COMPREHENSIVE METABOLIC PANEL
ALT: 9 U/L — AB (ref 14–54)
AST: 23 U/L (ref 15–41)
Albumin: 2.8 g/dL — ABNORMAL LOW (ref 3.5–5.0)
Alkaline Phosphatase: 106 U/L (ref 38–126)
Anion gap: 9 (ref 5–15)
BUN: 38 mg/dL — AB (ref 6–20)
CHLORIDE: 113 mmol/L — AB (ref 101–111)
CO2: 20 mmol/L — AB (ref 22–32)
CREATININE: 1.63 mg/dL — AB (ref 0.44–1.00)
Calcium: 8.5 mg/dL — ABNORMAL LOW (ref 8.9–10.3)
GFR calc Af Amer: 37 mL/min — ABNORMAL LOW (ref >60)
GFR, EST NON AFRICAN AMERICAN: 32 mL/min — AB (ref >60)
Glucose, Bld: 185 mg/dL — ABNORMAL HIGH (ref 65–99)
Potassium: 3.4 mmol/L — ABNORMAL LOW (ref 3.5–5.1)
Sodium: 142 mmol/L (ref 135–145)
Total Bilirubin: 0.7 mg/dL (ref 0.3–1.2)
Total Protein: 6.3 g/dL — ABNORMAL LOW (ref 6.5–8.1)

## 2016-06-22 LAB — PROTIME-INR
INR: 0.94 (ref 0.00–1.49)
PROTHROMBIN TIME: 12.8 s (ref 11.6–15.2)

## 2016-06-22 LAB — I-STAT CHEM 8, ED
BUN: 45 mg/dL — ABNORMAL HIGH (ref 6–20)
CHLORIDE: 111 mmol/L (ref 101–111)
CREATININE: 1.7 mg/dL — AB (ref 0.44–1.00)
Calcium, Ion: 1.15 mmol/L (ref 1.12–1.23)
Glucose, Bld: 185 mg/dL — ABNORMAL HIGH (ref 65–99)
HEMATOCRIT: 31 % — AB (ref 36.0–46.0)
HEMOGLOBIN: 10.5 g/dL — AB (ref 12.0–15.0)
POTASSIUM: 3.4 mmol/L — AB (ref 3.5–5.1)
Sodium: 145 mmol/L (ref 135–145)
TCO2: 24 mmol/L (ref 0–100)

## 2016-06-22 LAB — HEPARIN LEVEL (UNFRACTIONATED): HEPARIN UNFRACTIONATED: 1.1 [IU]/mL — AB (ref 0.30–0.70)

## 2016-06-22 LAB — URINE MICROSCOPIC-ADD ON

## 2016-06-22 LAB — I-STAT CG4 LACTIC ACID, ED: LACTIC ACID, VENOUS: 1.44 mmol/L (ref 0.5–1.9)

## 2016-06-22 MED ORDER — LORAZEPAM 2 MG/ML IJ SOLN
1.0000 mg | Freq: Once | INTRAMUSCULAR | Status: AC
  Administered 2016-06-22: 1 mg via INTRAVENOUS

## 2016-06-22 MED ORDER — HYDRALAZINE HCL 20 MG/ML IJ SOLN
10.0000 mg | Freq: Four times a day (QID) | INTRAMUSCULAR | Status: DC | PRN
  Administered 2016-06-23 – 2016-06-25 (×4): 10 mg via INTRAVENOUS
  Filled 2016-06-22 (×4): qty 1

## 2016-06-22 MED ORDER — VALPROATE SODIUM 500 MG/5ML IV SOLN
500.0000 mg | Freq: Two times a day (BID) | INTRAVENOUS | Status: DC
  Administered 2016-06-23 – 2016-06-24 (×4): 500 mg via INTRAVENOUS
  Filled 2016-06-22 (×6): qty 5

## 2016-06-22 MED ORDER — SODIUM CHLORIDE 0.9 % IV SOLN
1000.0000 mg | Freq: Two times a day (BID) | INTRAVENOUS | Status: DC
  Administered 2016-06-22 – 2016-06-26 (×8): 1000 mg via INTRAVENOUS
  Filled 2016-06-22 (×9): qty 10

## 2016-06-22 MED ORDER — SODIUM CHLORIDE 0.45 % IV SOLN
INTRAVENOUS | Status: DC
  Administered 2016-06-22: 20:00:00 via INTRAVENOUS

## 2016-06-22 MED ORDER — SODIUM CHLORIDE 0.9 % IV BOLUS (SEPSIS)
1000.0000 mL | Freq: Once | INTRAVENOUS | Status: AC
  Administered 2016-06-22: 1000 mL via INTRAVENOUS

## 2016-06-22 MED ORDER — POLYETHYLENE GLYCOL 3350 17 G PO PACK
17.0000 g | PACK | Freq: Two times a day (BID) | ORAL | Status: DC
  Administered 2016-06-26 – 2016-06-29 (×6): 17 g via ORAL
  Filled 2016-06-22 (×8): qty 1

## 2016-06-22 MED ORDER — TRAZODONE HCL 50 MG PO TABS
50.0000 mg | ORAL_TABLET | Freq: Every day | ORAL | Status: DC
  Administered 2016-06-25 – 2016-06-28 (×4): 50 mg via ORAL
  Filled 2016-06-22 (×4): qty 1

## 2016-06-22 MED ORDER — PANTOPRAZOLE SODIUM 40 MG PO TBEC: 40.0000 mg | DELAYED_RELEASE_TABLET | Freq: Every day | ORAL | Status: DC

## 2016-06-22 MED ORDER — VALPROATE SODIUM 500 MG/5ML IV SOLN
1000.0000 mg | Freq: Once | INTRAVENOUS | Status: AC
  Administered 2016-06-22: 1000 mg via INTRAVENOUS
  Filled 2016-06-22: qty 10

## 2016-06-22 MED ORDER — ISOSORBIDE DINITRATE 10 MG PO TABS
30.0000 mg | ORAL_TABLET | Freq: Two times a day (BID) | ORAL | Status: DC
  Administered 2016-06-25 – 2016-06-29 (×9): 30 mg via ORAL
  Filled 2016-06-22: qty 1
  Filled 2016-06-22: qty 3
  Filled 2016-06-22 (×6): qty 1
  Filled 2016-06-22: qty 3
  Filled 2016-06-22 (×6): qty 1

## 2016-06-22 MED ORDER — METOCLOPRAMIDE HCL 5 MG PO TABS: 5.0000 mg | ORAL_TABLET | Freq: Three times a day (TID) | ORAL | Status: DC

## 2016-06-22 MED ORDER — ACETAMINOPHEN 500 MG PO TABS: 500.0000 mg | ORAL_TABLET | Freq: Four times a day (QID) | ORAL | Status: DC

## 2016-06-22 MED ORDER — ATORVASTATIN CALCIUM 20 MG PO TABS
20.0000 mg | ORAL_TABLET | Freq: Every day | ORAL | Status: DC
  Administered 2016-06-25 – 2016-06-28 (×4): 20 mg via ORAL
  Filled 2016-06-22 (×4): qty 1

## 2016-06-22 MED ORDER — SODIUM CHLORIDE 0.9% FLUSH
3.0000 mL | Freq: Two times a day (BID) | INTRAVENOUS | Status: DC
  Administered 2016-06-23 – 2016-06-29 (×10): 3 mL via INTRAVENOUS

## 2016-06-22 MED ORDER — SODIUM CHLORIDE 0.9 % IV SOLN
750.0000 mg | Freq: Once | INTRAVENOUS | Status: AC
  Administered 2016-06-22: 750 mg via INTRAVENOUS
  Filled 2016-06-22: qty 7.5

## 2016-06-22 MED ORDER — HEPARIN (PORCINE) IN NACL 100-0.45 UNIT/ML-% IJ SOLN
1200.0000 [IU]/h | INTRAMUSCULAR | Status: DC
  Administered 2016-06-22: 1200 [IU]/h via INTRAVENOUS
  Filled 2016-06-22: qty 250

## 2016-06-22 MED ORDER — MIDAZOLAM HCL 2 MG/2ML IJ SOLN
2.5000 mg | Freq: Once | INTRAMUSCULAR | Status: AC
  Administered 2016-06-22: 2.5 mg via INTRAVENOUS

## 2016-06-22 MED ORDER — HYDRALAZINE HCL 50 MG PO TABS
100.0000 mg | ORAL_TABLET | Freq: Three times a day (TID) | ORAL | Status: DC
  Administered 2016-06-25 – 2016-06-28 (×10): 100 mg via ORAL
  Filled 2016-06-22 (×11): qty 2

## 2016-06-22 MED ORDER — METOPROLOL TARTRATE 25 MG/10 ML ORAL SUSPENSION: 6.2500 mg | Freq: Two times a day (BID) | ORAL | Status: DC

## 2016-06-22 MED ORDER — LORAZEPAM 2 MG/ML IJ SOLN
INTRAMUSCULAR | Status: AC
  Filled 2016-06-22: qty 1

## 2016-06-22 MED ORDER — MIDAZOLAM HCL 2 MG/2ML IJ SOLN
INTRAMUSCULAR | Status: AC
Start: 2016-06-22 — End: 2016-06-23
  Filled 2016-06-22: qty 4

## 2016-06-22 MED ORDER — LEVETIRACETAM 750 MG PO TABS: 750.0000 mg | ORAL_TABLET | Freq: Two times a day (BID) | ORAL | Status: DC

## 2016-06-22 MED ORDER — ALLOPURINOL 100 MG PO TABS
100.0000 mg | ORAL_TABLET | Freq: Every day | ORAL | Status: DC
  Administered 2016-06-25 – 2016-06-29 (×5): 100 mg via ORAL
  Filled 2016-06-22 (×5): qty 1

## 2016-06-22 MED ORDER — SODIUM CHLORIDE 0.9% FLUSH: 3.0000 mL | INTRAVENOUS | Status: DC | PRN

## 2016-06-22 MED ORDER — LORAZEPAM 2 MG/ML IJ SOLN
1.0000 mg | INTRAMUSCULAR | Status: DC | PRN
  Administered 2016-06-22: 2 mg via INTRAVENOUS
  Administered 2016-06-22: 1 mg via INTRAVENOUS
  Administered 2016-06-23: 2 mg via INTRAVENOUS
  Filled 2016-06-22 (×5): qty 1

## 2016-06-22 MED ORDER — METOPROLOL TARTRATE 25 MG PO TABS: 6.2500 mg | ORAL_TABLET | Freq: Two times a day (BID) | ORAL | Status: DC

## 2016-06-22 MED ORDER — HEPARIN BOLUS VIA INFUSION
4000.0000 [IU] | Freq: Once | INTRAVENOUS | Status: AC
  Administered 2016-06-22: 4000 [IU] via INTRAVENOUS
  Filled 2016-06-22: qty 4000

## 2016-06-22 MED ORDER — SODIUM CHLORIDE 0.9 % IV SOLN: 250.0000 mL | INTRAVENOUS | Status: DC | PRN

## 2016-06-22 MED ORDER — HYDRALAZINE HCL 100 MG PO TABS: 100.0000 mg | ORAL_TABLET | Freq: Three times a day (TID) | ORAL | Status: DC

## 2016-06-22 MED ORDER — AMLODIPINE BESYLATE 10 MG PO TABS
10.0000 mg | ORAL_TABLET | Freq: Every day | ORAL | Status: DC
  Administered 2016-06-25 – 2016-06-28 (×4): 10 mg via ORAL
  Filled 2016-06-22 (×4): qty 1

## 2016-06-22 MED ORDER — DEXTROSE-NACL 5-0.45 % IV SOLN: INTRAVENOUS | Status: AC

## 2016-06-22 NOTE — ED Notes (Signed)
Pt in [facility] had a lt sided  4 minute seizure with loc.[name] Test not finished  Brought back to ed  Ativan given

## 2016-06-22 NOTE — Progress Notes (Signed)
This is a no charge note in response to nurse call from Floor.  Brief progressive note:  Brief description of history: 65 year old lady with a past medical history of atrial fibrillation, stroke, diastolic congestive heart failure, seizure, CKD-IV,  bedbound in [place] x 3 years, who is admitted due to seizure today. Pt was noted to have sob/tachypnea. She was started with IV heparin for possible PE and pending VQ scan. Neurology, Dr. Tasia Catchings evaluated pt and recommended IV bolus Keppra 500 mg and Keppra 750 mg twice a day. I was called by RN from floor at about 9:30 PM, reporting that pt has recurrent seizure activity. 1 mg of ativan was given by RN, without help. When I saw pt on the floor, pt is seizuring with shaking upper body and both arms. Hemodynamically stable. Airway is protected.  - I spoke with neuro, dr. Nicole Kindred, who recommended to give IV Depacon 1000 mg, followed by 500 mg twice a day; and Keppra 1000 mg IV, followed by 1000 mg IV twice a day. -RN was instructed to hold oral meds until passing bedside swallowing test.   Ivor Costa, MD  [facility] Pager [phone]  If 7PM-7AM, please contact night-coverage [email or web address] Password [facility] 06/22/2016, 8:24 PM

## 2016-06-22 NOTE — ED Notes (Signed)
Report attempted unable to give

## 2016-06-22 NOTE — Consult Note (Signed)
NEURO HOSPITALIST CONSULT NOTE   Requestig physician: Dr. Kathrynn Humble   Reason for Consult:  Seizures   History obtained from:  Patient and Chart and family  [name] is a lovely 65 year old patient she presents today for an episode of confusion and disorientation which was followed by generalized tonic-clonic activity that appears to have lasted for a minute or 2. [name] has been previously evaluated here at [facility] on a few occasions for similar seizure activity. [name] had a prior EEG that revealed no evidence of epileptiform activity but did reveal some generalized intermittent slowing consistent with mild encephalopathy. [name] has been previously placed on Keppra which was started in January. Upon discussion of this case with [name] husband he is not sure if she actually has an outpatient neurologist to follow her progress. It does not appear that [name] has never undergone an ambulatory EEG or epilepsy monitoring evaluation.  [name] has a known history of congestive heart failure as well as atrial fibrillation and she is noted to have a significant arrhythmia during her evaluation here in the emergency room.  At this time [name] appears slightly postictal she is awake she is alert and oriented and following commands she is able to move all 4 extremities she appears to have some numbness distally in the lower extremities her cranial nerve exam evaluation is normal.    HPI:                                                                                                                                          [name] is an 65 y.o. female   Past Medical History  Diagnosis Date  . Asthma   . Bronchitis   . Allergic rhinitis   . HTN (hypertension)   . Hyperlipidemia   . Obesity   . OSA (obstructive sleep apnea)     poor compliance with cpap  . CHF (congestive heart failure) ([place])   . Biventricular failure ([place])     compensated  . Psoriasis   . Stasis  edema     of lower extremities  . Depression   . Situational stress   . Anemia   . Gout   . Chronic anticoagulation     off due to GIB, thigh hematoma  . Yeast infection involving the vagina and surrounding area   . Atrial fibrillation with RVR ([place])   . GERD (gastroesophageal reflux disease)   . Chronic bronchitis ([place])     "get it q yr" (08/10/2013)  . Shortness of breath     "all the time" (08/10/2013)  . On home oxygen therapy     "2L during the night and prn during the day" (08/10/2013)  . Type II diabetes mellitus ([place])   . History of blood transfusion     "  few during my lifetime; last time was 4 days ago when I got 2 units" (08/10/2013)  . Stroke [facility]) ?2007    denies residuals on 08/10/2013  . DJD (degenerative joint disease)   . Bleeding     "from my skin folds; just won't stop" (08/10/2013)  . Complication of anesthesia     "they gave me too much; I stayed out of it for awhile" (08/10/2013)  . Anxiety   . Kidney stones   . Kidney disease, chronic, stage III (GFR 30-59 ml/min)   . Chronic kidney disease, stage IV (severe) ([place])   . Morbid obesity ([place])   . Diastolic CHF ([place])   . Pneumonia     "said I did on 08/03/2013 then they ruled it out" (08/10/2013)  . HCAP (healthcare-associated pneumonia) 01/03/2016  . Seizures [facility])     Past Surgical History  Procedure Laterality Date  . Appendectomy    . Cholecystectomy    . Total knee arthroplasty Left ~ 2006  . Total abdominal hysterectomy    . Back surgery    . Cystectomy Left     hand  . Tonsillectomy    . Joint replacement    . Lumbar disc surgery    . Colonoscopy N/A 08/16/2013    Procedure: COLONOSCOPY;  Surgeon: Beryle Beams, MD;  Location: [place];  Service: Endoscopy;  Laterality: N/A;  . Hematoma evacuation Right 09/10/2013    Procedure: EVACUATION OF RIGHT LEG HEMATOMA AND EXCISION [place] RIGHT LEG ;  Surgeon: Merrie Roof, MD;  Location: [facility];  Service: General;  Laterality: Right;  . Debridement and  closure wound Right 09/22/2013    Procedure: IRRIGATION AND DEBRIDEMENT SURGICAL PREP PLACEMENT OF [place]  ;  Surgeon: Irene Limbo, MD;  Location: [facility];  Service: Plastics;  Laterality: Right;  . Skin split graft Right 10/07/2013    Procedure: SPLIT THICKNESS SKIN GRAFT RIGHT THIGH TO RIGHT LEG, PLACEMENT OF A CEL AND WOUND VAC TO RIGHT THIGH  ;  Surgeon: Irene Limbo, MD;  Location: [facility];  Service: Plastics;  Laterality: Right;    Family History  Problem Relation Age of Onset  . Heart attack Father   . Asthma Father   . Heart disease Paternal Uncle   . Rectal cancer Paternal Aunt   . Other Mother     mva    Family History: reviewed  Social History:  reports that she quit smoking about 33 years ago. Her smoking use included Cigarettes. She has a 1.44 pack-year smoking history. She has quit using smokeless tobacco. She reports that she does not drink alcohol or use illicit drugs.  Allergies  Allergen Reactions  . Daptomycin Other (See Comments)    Elevated CPK  . Morphine And Related Nausea And Vomiting  . Cephalexin Rash  . Tramadol Nausea And Vomiting  . Atenolol     unknown  . Cephalosporins Other (See Comments)    Unknown  . Dapsone Other (See Comments)    unknown  . Erythromycin     unknown  . Erythromycin Base Other (See Comments)    Unknown  . Morphine Nausea And Vomiting  . Celecoxib Other (See Comments)    Unknown, can use furosemide without issue  . Cephalexin Rash  . Codeine Other (See Comments)    unknown  . Fluoxetine Hcl Other (See Comments)    unknown  . Latex Other (See Comments)  . Ofloxacin Other (See Comments)    unknown  .  Penicillins Other (See Comments)    Unknown  Has patient had a PCN reaction causing immediate rash, facial/tongue/throat swelling, SOB or lightheadedness with hypotension: unknown Has patient had a PCN reaction causing severe rash involving mucus membranes or skin necrosis: unknown Has patient had a PCN  reaction that required hospitalization unknown Has patient had a PCN reaction occurring within the last 10 years: unknown If all of the above answers are "NO", then may proceed with Cephalosporin use.  [name] Rofecoxib Other (See Comments)    unknown  . Sulfonamide Derivatives Other (See Comments)    unknown    MEDICATIONS:                                                                                                                     I have reviewed the patient's current medications.   ROS:                                                                                                                                       History obtained from chart review  General ROS: negative for - chills, fatigue, fever, night sweats, weight gain or weight loss Psychological ROS: negative for - behavioral disorder, hallucinations, memory difficulties, mood swings or suicidal ideation Ophthalmic ROS: negative for - blurry vision, double vision, eye pain or loss of vision ENT ROS: negative for - epistaxis, nasal discharge, oral lesions, sore throat, tinnitus or vertigo Allergy and Immunology ROS: negative for - hives or itchy/watery eyes Hematological and Lymphatic ROS: negative for - bleeding problems, bruising or swollen lymph nodes Endocrine ROS: negative for - galactorrhea, hair pattern changes, polydipsia/polyuria or temperature intolerance Respiratory ROS: negative for - cough, hemoptysis, shortness of breath or wheezing Cardiovascular ROS: negative for - chest pain, dyspnea on exertion, edema or irregular heartbeat Gastrointestinal ROS: negative for - abdominal pain, diarrhea, hematemesis, nausea/vomiting or stool incontinence Genito-Urinary ROS: negative for - dysuria, hematuria, incontinence or urinary frequency/urgency Musculoskeletal ROS: negative for - joint swelling or muscular weakness Neurological ROS: as noted in HPI Dermatological ROS: negative for rash and skin lesion  changes   Blood pressure 178/69, pulse 97, temperature 96.7 F (35.9 C), temperature source Rectal, resp. rate 23, weight 101.606 kg (224 lb), SpO2 100 %.   Neurologic Examination:  HEENT-  Normocephalic, no lesions, without obvious abnormality.  Normal external eye and conjunctiva.  Normal TM's bilaterally.  Normal auditory canals and external ears. Normal external nose, mucus membranes and septum.  Normal pharynx. Cardiovascular- arrhythmia with grade II/VI SEM Lungs- chest clear, no wheezing, rales, normal symmetric air entry, Heart exam - S1, S2 normal, no murmur, no gallop, rate regular Abdomen- soft, non-tender; bowel sounds normal; no masses,  no organomegaly Extremities- less then 2 second capillary refill Lymph-no adenopathy palpable Musculoskeletal-no joint tenderness, deformity or swelling Skin-hyperpigmentation distally in the lower extremities.  Neurological Examination Mental Status: [name] is noted to be somewhat lethargic however she is able to follow commands appropriately Cranial Nerves: II: Discs flat bilaterally; Visual fields grossly normal, pupils equal, round, reactive to light and accommodation III,IV, VI: ptosis not present, extra-ocular motions intact bilaterally V,VII: smile symmetric, facial light touch sensation normal bilaterally VIII: hearing normal bilaterally IX,X: uvula rises symmetrically XI: bilateral shoulder shrug XII: midline tongue extension Motor: Antigravity in all 4 extremities Tone and bulk:normal tone throughout; no atrophy noted Sensory: Light touch appears decreased distally in the lower extremities Deep Tendon Reflexes: 1+ and symmetric throughout Plantars: Right: downgoing   Left: downgoing Cerebellar: Unreliable due to lethargy Gait: Cannot be assessed at the present time      Lab Results: Basic Metabolic Panel:  Recent  Labs Lab 06/22/16 1240 06/22/16 1255  NA 142 145  K 3.4* 3.4*  CL 113* 111  CO2 20*  --   GLUCOSE 185* 185*  BUN 38* 45*  CREATININE 1.63* 1.70*  CALCIUM 8.5*  --     Liver Function Tests:  Recent Labs Lab 06/22/16 1240  AST 23  ALT 9*  ALKPHOS 106  BILITOT 0.7  PROT 6.3*  ALBUMIN 2.8*   No results for input(s): LIPASE, AMYLASE in the last 168 hours. No results for input(s): AMMONIA in the last 168 hours.  CBC:  Recent Labs Lab 06/22/16 1240 06/22/16 1255  WBC 12.1*  --   NEUTROABS 9.6*  --   HGB 10.2* 10.5*  HCT 32.9* 31.0*  MCV 92.9  --   PLT 231  --     Cardiac Enzymes: No results for input(s): CKTOTAL, CKMB, CKMBINDEX, TROPONINI in the last 168 hours.  Lipid Panel: No results for input(s): CHOL, TRIG, HDL, CHOLHDL, VLDL, LDLCALC in the last 168 hours.  CBG: No results for input(s): GLUCAP in the last 168 hours.  Microbiology: Results for orders placed or performed during the hospital encounter of 01/03/16  Urine culture     Status: None   Collection Time: 01/03/16  9:45 AM  Result Value Ref Range Status   Specimen Description URINE, CATHETERIZED  Final   Special Requests NONE  Final   Culture   Final    70,000 COLONIES/ml ESCHERICHIA COLI 30,000 COLONIES/mL KLEBSIELLA PNEUMONIAE    Report Status 01/06/2016 FINAL  Final   Organism ID, Bacteria KLEBSIELLA PNEUMONIAE  Final   Organism ID, Bacteria ESCHERICHIA COLI  Final      Susceptibility   Escherichia coli - MIC*    AMPICILLIN >=32 RESISTANT Resistant     CEFAZOLIN >=64 RESISTANT Resistant     CEFTRIAXONE <=1 SENSITIVE Sensitive     CIPROFLOXACIN >=4 RESISTANT Resistant     GENTAMICIN <=1 SENSITIVE Sensitive     IMIPENEM <=0.25 SENSITIVE Sensitive     NITROFURANTOIN <=16 SENSITIVE Sensitive     TRIMETH/SULFA <=20 SENSITIVE Sensitive     AMPICILLIN/SULBACTAM 16 INTERMEDIATE Intermediate  PIP/TAZO <=4 SENSITIVE Sensitive     * 70,000 COLONIES/ml ESCHERICHIA COLI   Klebsiella  pneumoniae - MIC*    AMPICILLIN >=32 RESISTANT Resistant     CEFAZOLIN <=4 SENSITIVE Sensitive     CEFTRIAXONE <=1 SENSITIVE Sensitive     CIPROFLOXACIN <=0.25 SENSITIVE Sensitive     GENTAMICIN <=1 SENSITIVE Sensitive     IMIPENEM <=0.25 SENSITIVE Sensitive     NITROFURANTOIN 64 INTERMEDIATE Intermediate     TRIMETH/SULFA <=20 SENSITIVE Sensitive     AMPICILLIN/SULBACTAM 4 SENSITIVE Sensitive     PIP/TAZO <=4 SENSITIVE Sensitive     * 30,000 COLONIES/mL KLEBSIELLA PNEUMONIAE  Blood culture (routine x 2)     Status: None   Collection Time: 01/03/16 10:50 AM  Result Value Ref Range Status   Specimen Description BLOOD LEFT ANTECUBITAL  Final   Special Requests BOTTLES DRAWN AEROBIC AND ANAEROBIC 5CC  Final   Culture  Setup Time   Final    GRAM POSITIVE COCCI IN BOTH AEROBIC AND ANAEROBIC BOTTLES CRITICAL RESULT CALLED TO, READ BACK BY AND VERIFIED WITH: KBROWN,RN @0431  01/04/16 MKELLY    Culture   Final    ENTEROCOCCUS SPECIES STAPHYLOCOCCUS SPECIES (COAGULASE NEGATIVE) THE SIGNIFICANCE OF ISOLATING THIS ORGANISM FROM A SINGLE SET OF BLOOD CULTURES WHEN MULTIPLE SETS ARE DRAWN IS UNCERTAIN. PLEASE NOTIFY THE MICROBIOLOGY DEPARTMENT WITHIN ONE WEEK IF SPECIATION AND SENSITIVITIES ARE REQUIRED.    Report Status 01/06/2016 FINAL  Final   Organism ID, Bacteria ENTEROCOCCUS SPECIES  Final      Susceptibility   Enterococcus species - MIC*    AMPICILLIN <=2 SENSITIVE Sensitive     VANCOMYCIN 1 SENSITIVE Sensitive     GENTAMICIN SYNERGY SENSITIVE Sensitive     LINEZOLID 2 SENSITIVE Sensitive     * ENTEROCOCCUS SPECIES  Blood culture (routine x 2)     Status: None   Collection Time: 01/03/16 10:54 AM  Result Value Ref Range Status   Specimen Description BLOOD BLOOD LEFT FOREARM  Final   Special Requests BOTTLES DRAWN AEROBIC AND ANAEROBIC 5CC  Final   Culture NO GROWTH 5 DAYS  Final   Report Status 01/08/2016 FINAL  Final  MRSA PCR Screening     Status: None   Collection Time:  01/03/16  6:02 PM  Result Value Ref Range Status   MRSA by PCR NEGATIVE NEGATIVE Final    Comment:        The GeneXpert MRSA Assay (FDA approved for NASAL specimens only), is one component of a comprehensive MRSA colonization surveillance program. It is not intended to diagnose MRSA infection nor to guide or monitor treatment for MRSA infections.     Coagulation Studies:  Recent Labs  06/22/16 1240  LABPROT 12.8  INR 0.94    Imaging: Ct Head Wo Contrast  06/22/2016  CLINICAL DATA:  Unresponsive at nursing home.  Seizures. EXAM: CT HEAD WITHOUT CONTRAST TECHNIQUE: Contiguous axial images were obtained from the base of the skull through the vertex without intravenous contrast. 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 There is no evidence for acute hemorrhage, hydrocephalus, mass lesion, or abnormal extra-axial fluid collection. No definite CT evidence for acute infarction. Diffuse loss of parenchymal volume is consistent with atrophy. Patchy low attenuation in the deep hemispheric and periventricular white matter is nonspecific, but likely reflects chronic microvascular ischemic demyelination. The visualized paranasal sinuses and mastoid air cells are clear. IMPRESSION: Stable.  Atrophy with chronic small vessel white matter disease.  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 Electronically Signed   By: Misty Stanley M.D.   On: 06/22/2016 13:48    Assessment/Plan:  [name] is a lovely 65 year old patient she presents today with a another seizure. [name] has now been to the emergency room on at least 3 occasions with seizure activity she has already been started on Keppra. A prior EEG revealed no epileptiform discharges but did reveal some generalized slowing consistent with encephalopathy. It is unknown at this EEG was performed immediately after a seizure or not.  The differential diagnosis for seizures at this nature  would include epilepsy, provoked seizures, and nonepileptic events. Given the specific details of this history it is likely that [name] is experiencing provoked seizures related to her cerebrovascular issues. It is likely that she is experiencing episodes of arrhythmia causing decreased cerebral perfusion. This would likely explain the preceding confusion and disorientation noted by her husband followed by the epileptic activity.  The CT evaluation reveals evidence of prior bilateral occipital ischemic changes. This would also serve as a potential site for epileptic activity. There was no evidence of any new ischemic change during today's visit.  At this point further evaluations would best be performed by her neurologist on an outpatient basis. These evaluations could include an ambulatory EEG or possibly a dedicated in the Hospital epilepsy monitoring unit evaluation in an attempt to further discern the etiology of seizures.  During today's visit there is no evidence that [name] was experiencing an infectious process such as meningitis or encephalitis. She had stable vital signs and was afebrile. Her laboratory studies were not indicative of an infectious etiology.  Her neurologic exam was normal with no focal neurological deficits. As such it was not felt that lumbar puncture or additional evaluations would be of benefit to [name] at this time.  Plan  1. Follow up with outpatient neurology for additional evaluations to help determine the etiology of the seizure activity.  2. Bolus Keppra at 500 mg. 3. Increase Keppra to 750 mg twice a day 4. [place] is already in a nursing home however routine seizure precautions should be followed. 5. Additional evaluations by the cardiology service to help determine if the arrhythmia and congestive heart failure are the cause of the seizure activity would also be beneficial.  Thank you for consulting the neurology service to assist in the care of your  patient!   Jaymien Landin A. Tasia Catchings, M.D. Neurohospitalist Phone: [phone]  06/22/2016, 2:24 PM

## 2016-06-22 NOTE — Progress Notes (Signed)
Pt failed RN stroke swallow screen,MD Nicole Kindred notified of pt continued seizure and EEG monitoring ordered. Currently being set up. Husband and Son at bedside.

## 2016-06-22 NOTE — ED Notes (Signed)
Report given to rn on 3300

## 2016-06-22 NOTE — Progress Notes (Signed)
Chaplain responded to page to support family of pt in Trauma [facility]. Pt's husband and son were at bedside. Pt's husband said he had been visiting pt in nursing facility where she resides and he noticed something was abnormal. Chaplain provided empathic conversation and prayer support for pt's husband. Chaplain provided refreshments also. Pt's husband expressed appreciation for chaplain support.

## 2016-06-22 NOTE — H&P (Addendum)
[facility] History and Physical  [name] [name] DOB: [birth year] DOA: 06/22/2016  Referring physician: ed MD PCP: Kandice Hams, MD   Chief Complaint: seizures  HPI: [name] is a 65 y.o. 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 She's been living in the nursing home for 3 years now and is full assist for all ADLs, w/ recent dx in 1/17 of  seizure disorder, with PMHx of atrial fibrillation, history of hypertension, chronic 2 L n/c at NH, and chronic kidney disease, presents to our hospital for seizure.  Per husband, who I was able to talk to later in afternoon, states patient was in normal state of health until today, when he was visiting her, she started "acting abnormal."   He called the nurse and the nurse thought initially it was due to her O2 tank not being turned on.  After further evaluation, she remained confused.  EMS called, enroute pt had a tonic-clonic seizures, they gave her versed iv, and she was coming to in the ER.  I was called to admit her.  On arrival to room (husband not in room at time), ED MD present, pt c/o of doe, and tachypneac on exam.  Hr 90s. Given bed ridden status, high concern for PE, we discussed and decided on vq scan, and to start pt on emp hep gtt for now until PE ruled out.    Meds administered in ED: keppra 1000mg  bolus, NS 1034ml, hep bolus 4000units, than hep gtt infusion.   Review of Systems:  Limited exam due to altered mentation and postictal state, pt mumbles "short of breath". She initially complained of "hot" few mins to my arrival, when I arrived, she c/o of being "hot"  Past Medical History  Diagnosis Date  . Asthma   . Bronchitis   . Allergic rhinitis   . HTN (hypertension)   . Hyperlipidemia   . Obesity   . OSA (obstructive sleep apnea)     poor compliance with cpap  . CHF (congestive heart failure) ([place])   . Biventricular failure ([place])     compensated  . Psoriasis    . Stasis edema     of lower extremities  . Depression   . Situational stress   . Anemia   . Gout   . Chronic anticoagulation     off due to GIB, thigh hematoma  . Yeast infection involving the vagina and surrounding area   . Atrial fibrillation with RVR ([place])   . GERD (gastroesophageal reflux disease)   . Chronic bronchitis ([place])     "get it q yr" (08/10/2013)  . Shortness of breath     "all the time" (08/10/2013)  . On home oxygen therapy     "2L during the night and prn during the day" (08/10/2013)  . Type II diabetes mellitus ([place])   . History of blood transfusion     "few during my lifetime; last time was 4 days ago when I got 2 units" (08/10/2013)  . Stroke [facility]) ?2007    denies residuals on 08/10/2013  . DJD (degenerative joint disease)   . Bleeding     "from my skin folds; just won't stop" (08/10/2013)  . Complication of anesthesia     "they gave me too much; I stayed out of it for awhile" (08/10/2013)  . Anxiety   . Kidney stones   . Kidney disease, chronic, stage III (GFR 30-59 ml/min)   . Chronic kidney  disease, stage IV (severe) ([place])   . Morbid obesity ([place])   . Diastolic CHF ([place])   . Pneumonia     "said I did on 08/03/2013 then they ruled it out" (08/10/2013)  . HCAP (healthcare-associated pneumonia) 01/03/2016  . Seizures [facility])    Past Surgical History  Procedure Laterality Date  . Appendectomy    . Cholecystectomy    . Total knee arthroplasty Left ~ 2006  . Total abdominal hysterectomy    . Back surgery    . Cystectomy Left     hand  . Tonsillectomy    . Joint replacement    . Lumbar disc surgery    . Colonoscopy N/A 08/16/2013    Procedure: COLONOSCOPY;  Surgeon: Beryle Beams, MD;  Location: [facility];  Service: Endoscopy;  Laterality: N/A;  . Hematoma evacuation Right 09/10/2013    Procedure: EVACUATION OF RIGHT LEG HEMATOMA AND EXCISION [place] RIGHT LEG ;  Surgeon: Merrie Roof, MD;  Location: [facility];  Service: General;  Laterality: Right;  .  Debridement and closure wound Right 09/22/2013    Procedure: IRRIGATION AND DEBRIDEMENT SURGICAL PREP PLACEMENT OF [place]  ;  Surgeon: Irene Limbo, MD;  Location: [facility];  Service: Plastics;  Laterality: Right;  . Skin split graft Right 10/07/2013    Procedure: SPLIT THICKNESS SKIN GRAFT RIGHT THIGH TO RIGHT LEG, PLACEMENT OF A CEL AND WOUND VAC TO RIGHT THIGH  ;  Surgeon: Irene Limbo, MD;  Location: [facility];  Service: Plastics;  Laterality: Right;   Social History:  reports that she quit smoking about 33 years ago. Her smoking use included Cigarettes. She has a 1.44 pack-year smoking history. She has quit using smokeless tobacco. She reports that she does not drink alcohol or use illicit drugs.  Allergies  Allergen Reactions  . Daptomycin Other (See Comments)    Elevated CPK  . Morphine And Related Nausea And Vomiting  . Cephalexin Rash  . Tramadol Nausea And Vomiting  . Atenolol     unknown  . Cephalosporins Other (See Comments)    Unknown  . Dapsone Other (See Comments)    unknown  . Erythromycin     unknown  . Erythromycin Base Other (See Comments)    Unknown  . Morphine Nausea And Vomiting  . Celecoxib Other (See Comments)    Unknown, can use furosemide without issue  . Cephalexin Rash  . Codeine Other (See Comments)    unknown  . Fluoxetine Hcl Other (See Comments)    unknown  . Latex Other (See Comments)  . Ofloxacin Other (See Comments)    unknown  . Penicillins Other (See Comments)    Unknown  Has patient had a PCN reaction causing immediate rash, facial/tongue/throat swelling, SOB or lightheadedness with hypotension: unknown Has patient had a PCN reaction causing severe rash involving mucus membranes or skin necrosis: unknown Has patient had a PCN reaction that required hospitalization unknown Has patient had a PCN reaction occurring within the last 10 years: unknown If all of the above answers are "NO", then may proceed with Cephalosporin use.  [name]  Rofecoxib Other (See Comments)    unknown  . Sulfonamide Derivatives Other (See Comments)    unknown    Family History  Problem Relation Age of Onset  . Heart attack Father   . Asthma Father   . Heart disease Paternal Uncle   . Rectal cancer Paternal Aunt   . Other Mother     mva  Prior to Admission medications   Medication Sig Start Date End Date Taking? Authorizing Provider  acetaminophen (TYLENOL) 500 MG tablet Take 500 mg by mouth every 6 (six) hours.   Yes Historical Provider, MD  allopurinol (ZYLOPRIM) 100 MG tablet Take 100 mg by mouth daily. Patient takes at 0800am daily 09/16/11  Yes Historical Provider, MD  Amino Acids-Protein Hydrolys (FEEDING SUPPLEMENT, PRO-STAT SUGAR FREE 64,) LIQD Take 30 mLs by mouth 2 (two) times daily.   Yes Historical Provider, MD  amLODipine (NORVASC) 10 MG tablet Take 10 mg by mouth at bedtime.  08/19/13  Yes Thurnell Lose, MD  atorvastatin (LIPITOR) 20 MG tablet Take 20 mg by mouth at bedtime. Patient takes at 2100   Yes Historical Provider, MD  ferrous sulfate 325 (65 FE) MG tablet Take 325 mg by mouth 3 (three) times daily with meals.   Yes Historical Provider, MD  hydrALAZINE (APRESOLINE) 100 MG tablet Take 100 mg by mouth 3 (three) times daily.   Yes Historical Provider, MD  hydrALAZINE (APRESOLINE) 25 MG tablet Take 25 mg by mouth 3 (three) times daily.   Yes Historical Provider, MD  isosorbide dinitrate (ISORDIL) 30 MG tablet Take 1 tablet (30 mg total) by mouth 2 (two) times daily. 01/28/14  Yes Charlynne Cousins, MD  levETIRAcetam (KEPPRA) 500 MG tablet Take 1 tablet (500 mg total) by mouth 2 (two) times daily. 01/06/16  Yes Thurnell Lose, MD  metoCLOPramide (REGLAN) 5 MG tablet Take 5 mg by mouth 3 (three) times daily.   Yes Historical Provider, MD  metoprolol tartrate (LOPRESSOR) 25 MG tablet Take 6.25 mg by mouth 2 (two) times daily.    Yes Historical Provider, MD  omeprazole (PRILOSEC) 20 MG capsule Take 20 mg by mouth 2 (two)  times daily before a meal.    Yes Historical Provider, MD  OXYGEN Inhale 2 L into the lungs continuous.   Yes Historical Provider, MD  Polyethyl Glycol-Propyl Glycol (SYSTANE) 0.4-0.3 % SOLN Apply 1 drop to eye 4 (four) times daily.   Yes Historical Provider, MD  polyethylene glycol (MIRALAX / GLYCOLAX) packet Take 17 g by mouth 2 (two) times daily.   Yes Historical Provider, MD  traZODone (DESYREL) 50 MG tablet Take 50 mg by mouth at bedtime.    Yes Historical Provider, MD  Vitamin D, Ergocalciferol, (DRISDOL) 50000 units CAPS capsule Take 50,000 Units by mouth 3 (three) times a week.   Yes Historical Provider, MD  albuterol (PROVENTIL) (2.5 MG/3ML) 0.083% nebulizer solution Take 2.5 mg by nebulization every 6 (six) hours as needed for wheezing or shortness of breath.    Historical Provider, MD  cyanocobalamin (,VITAMIN B-12,) 1000 MCG/ML injection Inject 1,000 mcg into the muscle every 30 (thirty) days.    Historical Provider, MD  feeding supplement (BOOST / RESOURCE BREEZE) LIQD Take 1 Container by mouth 2 (two) times daily between meals. Reported on 02/23/2016    Historical Provider, MD  LORazepam (ATIVAN) 1 MG tablet Take 1 mg by mouth every 8 (eight) hours. 02/23/16 1 mg po/sl/IM every 15 min up to 6 mg for seizure episode    Historical Provider, MD   Physical Exam: Filed Vitals:   06/22/16 1400 06/22/16 1500 06/22/16 1530 06/22/16 1545  BP: 178/69 186/83 189/92 166/92  Pulse: 97 95 93 91  Temp:      TempSrc:      Resp: 23 28 35 29  Height:      Weight:      SpO2: 100%  92% 90% 97%    Wt Readings from Last 3 Encounters:  06/22/16 101.606 kg (224 lb)  01/04/16 101.2 kg (223 lb 1.7 oz)  04/27/15 101.152 kg (223 lb)    General:  Tachypnea, in mild resp distress, o2 sats >95% on 2lnc, chronic ill appearing. Eyes: PERRL, normal lids, irises & conjunctiva ENT: grossly normal hearing, lips & tongue, dry MMM Neck: no LAD Cardiovascular: RRR, no m/r/g. No LE edema. Telemetry: SR 90s  during my exam, no arrhythmias  Respiratory: diminished diffusely, but no w/c/r. Abdomen: soft, ntnd, obese Skin: no rash or induration seen on limited exam Musculoskeletal: grossly normal tone BUE/BLE Psychiatric: confused, post-ictal state.   Neurologic: difficult to access. Mumbles "short of breath" and "cold" to me.          Labs on Admission:  Basic Metabolic Panel:  Recent Labs Lab 06/22/16 1240 06/22/16 1255  NA 142 145  K 3.4* 3.4*  CL 113* 111  CO2 20*  --   GLUCOSE 185* 185*  BUN 38* 45*  CREATININE 1.63* 1.70*  CALCIUM 8.5*  --    Liver Function Tests:  Recent Labs Lab 06/22/16 1240  AST 23  ALT 9*  ALKPHOS 106  BILITOT 0.7  PROT 6.3*  ALBUMIN 2.8*   No results for input(s): LIPASE, AMYLASE in the last 168 hours. No results for input(s): AMMONIA in the last 168 hours. CBC:  Recent Labs Lab 06/22/16 1240 06/22/16 1255  WBC 12.1*  --   NEUTROABS 9.6*  --   HGB 10.2* 10.5*  HCT 32.9* 31.0*  MCV 92.9  --   PLT 231  --    Cardiac Enzymes: No results for input(s): CKTOTAL, CKMB, CKMBINDEX, TROPONINI in the last 168 hours.  BNP (last 3 results) No results for input(s): BNP in the last 8760 hours.  ProBNP (last 3 results) No results for input(s): PROBNP in the last 8760 hours.  CBG: No results for input(s): GLUCAP in the last 168 hours.  Radiological Exams on Admission: Ct Head Wo Contrast  06/22/2016  CLINICAL DATA:  Unresponsive at nursing home.  Seizures. EXAM: CT HEAD WITHOUT CONTRAST TECHNIQUE: Contiguous axial images were obtained from the base of the skull through the vertex without intravenous contrast. 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 There is no evidence for acute hemorrhage, hydrocephalus, mass lesion, or abnormal extra-axial fluid collection. No definite CT evidence for acute infarction. Diffuse loss of parenchymal volume is consistent with atrophy. Patchy  low attenuation in the deep hemispheric and periventricular white matter is nonspecific, but likely reflects chronic microvascular ischemic demyelination. The visualized paranasal sinuses and mastoid air cells are clear. IMPRESSION: Stable.  Atrophy with chronic small vessel white matter disease. 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 Electronically Signed   By: Misty Stanley M.D.   On: 06/22/2016 13:48   Dg Chest Port 1 View  06/22/2016  CLINICAL DATA:  Grand mal seizure.  Possible aspiration. EXAM: PORTABLE CHEST 1 VIEW COMPARISON:  01/03/2016 FINDINGS: There is moderate cardiac enlargement. Aortic atherosclerosis noted. Asymmetric opacification within the right midlung and right upper lobe is identify. Mild diffuse pulmonary vascular congestion noted. IMPRESSION: 1. Cardiac enlargement and aortic atherosclerosis. 2. Mild asymmetric increased opacification within the right upper lobe and right midlung which may reflect underlying aspiration, pneumonia or asymmetric edema. Electronically Signed   By: Kerby Moors M.D.   On: 06/22/2016 14:28    EKG: Independently reviewed.   EKG Interpretation  Date/Time:  Saturday June 22 2016 12:38:46 EDT Ventricular Rate:  121 PR Interval:    QRS Duration: 83 QT Interval:  332 QTC Calculation: 471 R Axis:   59 Text Interpretation:  Sinus tachycardia with irregular rate Borderline repolarization abnormality No acute changes No significant change since last tracing Nonspecific ST and T wave abnormality Confirmed by Kathrynn Humble, MD, ANKIT [phone]) on 06/22/2016 12:56:16 PM      V/q scan pending:  Echo 11/14 Study Conclusions  - Left ventricle: The cavity size was normal. There was mild concentric hypertrophy. Systolic function was normal. The estimated ejection fraction was in the range of 50% to 55%. Wall motion was normal; there were no regional wall motion abnormalities. Doppler parameters are consistent with abnormal left ventricular  relaxation (grade 1 diastolic dysfunction). - Aortic valve: Mild regurgitation. - Left atrium: The atrium was mildly dilated. - Atrial septum: No defect or patent foramen ovale was identified.  Assessment/Plan Principal Problem:   Status epilepticus (HCC) Active Problems:   PAF (paroxysmal atrial fibrillation) (HCC)   Chronic combined systolic and diastolic CHF (congestive heart failure) (HCC)   SOB (shortness of breath)   Total self-care deficit   AKI (acute kidney injury) (HCC)   CKD (chronic kidney disease) stage 3, GFR 30-59 ml/min   Malignant HTN with heart disease, w/o CHF, with chronic kidney disease   1. Status epilepticus -  - placed on observation status given continue postictal state and high risk aspiration - neurology cs, recd bolus and than keppra 750mg  po bid - bedside swallow per rn - sz precautions - q4hr neurochecks  2. Sob, in setting of tachypnea as well as long-term bedbound state, high risk for pulmonary emboli. - emp started hep gtt after bolus in ed - VQ scan pending - if VQ neg, dc hep gtt  3. Malignant htn, in setting of ckd - on numerous home rx, including hydralazine tid, isodil, norvasc, metoprolol - hydralazine iv prn if unable to take po  4. aki w/ ckd 3 - sp ivf in ED, continue  - gentle hydration w/ ivf, hliv when tol po.  5. Chronic combined systolic and diastolic chf - pt denied pain to me initially,  - watch for vol overload w/ ivf.  6. PAF - currently sr,  - on hep gtt currently for possible PE.  7. Total care - i/o cath done in ed, full assist - frequent rotation to prevent ulcers   Neuro c/s by ED, Dr Tasia Catchings  Code Status: Full code, confirmed w/ husband DVT Prophylaxis: hep gtt Family Communication: husband came back to Pt's room, I was able to talk to him afterwards at bedside Disposition Plan: obs tele  Time spent: 9mins  Maren Reamer MD., MBA/MHA [facility] Pager [phone]    At 8pm,  paged by ER RN. Pt had another sz while enroute to VQ scan, received ativan, now w/ "twitches". Asked her to page Neuro for further recds.  650pm, paged by ER RN Gerald Stabs.  Pt was seen by Neuro and RN said Neuro did not think "epileptic sz" .  Pt sbp 150s currently, still waiting for stepdown bed.

## 2016-06-22 NOTE — ED Notes (Signed)
Received pt from [facility] for unresponsive. Pt had a grand mal seizure with fire and was twitching when EMS arrived. Pt given 2.5 of versed by EMS, twitching stopped. Pt LSN at 1130 per facility.

## 2016-06-22 NOTE — ED Notes (Signed)
Neurology MD at bedside

## 2016-06-22 NOTE — Progress Notes (Signed)
Pt arrived from the ED with apparent continuous seizures. HR elevated in the 130-140's with pupil deviation to the right. Nurse called [facility] to come and evaluate patient.

## 2016-06-22 NOTE — Progress Notes (Signed)
ANTICOAGULATION CONSULT NOTE - Initial Consult  Pharmacy Consult for heparin Indication: r/o PE  Allergies  Allergen Reactions  . Daptomycin Other (See Comments)    Elevated CPK  . Morphine And Related Nausea And Vomiting  . Cephalexin Rash  . Tramadol Nausea And Vomiting  . Atenolol     unknown  . Cephalosporins Other (See Comments)    Unknown  . Dapsone Other (See Comments)    unknown  . Erythromycin     unknown  . Erythromycin Base Other (See Comments)    Unknown  . Morphine Nausea And Vomiting  . Celecoxib Other (See Comments)    Unknown, can use furosemide without issue  . Cephalexin Rash  . Codeine Other (See Comments)    unknown  . Fluoxetine Hcl Other (See Comments)    unknown  . Latex Other (See Comments)  . Ofloxacin Other (See Comments)    unknown  . Penicillins Other (See Comments)    Unknown  Has patient had a PCN reaction causing immediate rash, facial/tongue/throat swelling, SOB or lightheadedness with hypotension: unknown Has patient had a PCN reaction causing severe rash involving mucus membranes or skin necrosis: unknown Has patient had a PCN reaction that required hospitalization unknown Has patient had a PCN reaction occurring within the last 10 years: unknown If all of the above answers are "NO", then may proceed with Cephalosporin use.  [name] Rofecoxib Other (See Comments)    unknown  . Sulfonamide Derivatives Other (See Comments)    unknown    Patient Measurements: Height: 5\' 3"  (160 cm) Weight: 224 lb (101.606 kg) IBW/kg (Calculated) : 52.4 Heparin Dosing Weight: 76kg  Vital Signs: Temp: 96.7 F (35.9 C) (07/15 1354) Temp Source: Rectal (07/15 1354) BP: 178/69 mmHg (07/15 1400) Pulse Rate: 97 (07/15 1400)  Labs:  Recent Labs  06/22/16 1240 06/22/16 1255  HGB 10.2* 10.5*  HCT 32.9* 31.0*  PLT 231  --   LABPROT 12.8  --   INR 0.94  --   CREATININE 1.63* 1.70*    Estimated Creatinine Clearance: 37.1 mL/min (by C-G formula  based on Cr of 1.7).   Medical History: Past Medical History  Diagnosis Date  . Asthma   . Bronchitis   . Allergic rhinitis   . HTN (hypertension)   . Hyperlipidemia   . Obesity   . OSA (obstructive sleep apnea)     poor compliance with cpap  . CHF (congestive heart failure) ([place])   . Biventricular failure ([place])     compensated  . Psoriasis   . Stasis edema     of lower extremities  . Depression   . Situational stress   . Anemia   . Gout   . Chronic anticoagulation     off due to GIB, thigh hematoma  . Yeast infection involving the vagina and surrounding area   . Atrial fibrillation with RVR ([place])   . GERD (gastroesophageal reflux disease)   . Chronic bronchitis ([place])     "get it q yr" (08/10/2013)  . Shortness of breath     "all the time" (08/10/2013)  . On home oxygen therapy     "2L during the night and prn during the day" (08/10/2013)  . Type II diabetes mellitus ([place])   . History of blood transfusion     "few during my lifetime; last time was 4 days ago when I got 2 units" (08/10/2013)  . Stroke [facility]) ?2007    denies residuals on 08/10/2013  . DJD (  degenerative joint disease)   . Bleeding     "from my skin folds; just won't stop" (08/10/2013)  . Complication of anesthesia     "they gave me too much; I stayed out of it for awhile" (08/10/2013)  . Anxiety   . Kidney stones   . Kidney disease, chronic, stage III (GFR 30-59 ml/min)   . Chronic kidney disease, stage IV (severe) ([place])   . Morbid obesity ([place])   . Diastolic CHF ([place])   . Pneumonia     "said I did on 08/03/2013 then they ruled it out" (08/10/2013)  . HCAP (healthcare-associated pneumonia) 01/03/2016  . Seizures (HCC)     Medications:  Infusions:  . heparin    . levETIRAcetam      Assessment: 63 yof presented to the ED with seizures. To start heparin IV for r/o PE. VQ scan is pending. Baseline H/H is slightly low and platelets are WNL. She is not on anticoagulation PTA. CT was negative for hemorrhage and  only showed old infarcts.   Goal of Therapy:  Heparin level 0.3-0.7 units/ml Monitor platelets by anticoagulation protocol: Yes   Plan:  - Heparin bolus 4000 units IV x 1 - Heparin gtt 1200 units/hr - Check a 6 hr heparin level - Daily heparin level and CBC  Karalina Tift, Rande Lawman 06/22/2016,4:00 PM

## 2016-06-22 NOTE — ED Notes (Signed)
Foley cath ordered  initiaaly  Then edp came in ordered an in and o cath  Pt has had her keppra and will be going for a lung scan in approx 2 hours.  husabnd at the bedside.  Pt is resting comfortably now

## 2016-06-22 NOTE — ED Notes (Signed)
Neuro at the bedside   Lt sided jerking continues.  Pt able to communicate  somewhat

## 2016-06-23 ENCOUNTER — Observation Stay (HOSPITAL_COMMUNITY)

## 2016-06-23 ENCOUNTER — Other Ambulatory Visit (HOSPITAL_COMMUNITY): Payer: Medicare Other

## 2016-06-23 ENCOUNTER — Encounter (HOSPITAL_COMMUNITY): Payer: Self-pay | Admitting: Cardiology

## 2016-06-23 DIAGNOSIS — E440 Moderate protein-calorie malnutrition: Secondary | ICD-10-CM | POA: Diagnosis present

## 2016-06-23 DIAGNOSIS — E11649 Type 2 diabetes mellitus with hypoglycemia without coma: Secondary | ICD-10-CM | POA: Diagnosis present

## 2016-06-23 DIAGNOSIS — N183 Chronic kidney disease, stage 3 (moderate): Secondary | ICD-10-CM | POA: Diagnosis not present

## 2016-06-23 DIAGNOSIS — I6783 Posterior reversible encephalopathy syndrome: Secondary | ICD-10-CM | POA: Diagnosis present

## 2016-06-23 DIAGNOSIS — K219 Gastro-esophageal reflux disease without esophagitis: Secondary | ICD-10-CM | POA: Diagnosis present

## 2016-06-23 DIAGNOSIS — I1310 Hypertensive heart and chronic kidney disease without heart failure, with stage 1 through stage 4 chronic kidney disease, or unspecified chronic kidney disease: Secondary | ICD-10-CM | POA: Diagnosis not present

## 2016-06-23 DIAGNOSIS — N184 Chronic kidney disease, stage 4 (severe): Secondary | ICD-10-CM | POA: Diagnosis present

## 2016-06-23 DIAGNOSIS — I130 Hypertensive heart and chronic kidney disease with heart failure and stage 1 through stage 4 chronic kidney disease, or unspecified chronic kidney disease: Secondary | ICD-10-CM | POA: Diagnosis present

## 2016-06-23 DIAGNOSIS — I313 Pericardial effusion (noninflammatory): Secondary | ICD-10-CM | POA: Diagnosis present

## 2016-06-23 DIAGNOSIS — I471 Supraventricular tachycardia: Secondary | ICD-10-CM | POA: Clinically undetermined

## 2016-06-23 DIAGNOSIS — R402312 Coma scale, best motor response, none, at arrival to emergency department: Secondary | ICD-10-CM | POA: Diagnosis present

## 2016-06-23 DIAGNOSIS — R0602 Shortness of breath: Secondary | ICD-10-CM

## 2016-06-23 DIAGNOSIS — N390 Urinary tract infection, site not specified: Secondary | ICD-10-CM | POA: Diagnosis present

## 2016-06-23 DIAGNOSIS — R9431 Abnormal electrocardiogram [ECG] [EKG]: Secondary | ICD-10-CM | POA: Diagnosis not present

## 2016-06-23 DIAGNOSIS — G40901 Epilepsy, unspecified, not intractable, with status epilepticus: Secondary | ICD-10-CM | POA: Diagnosis present

## 2016-06-23 DIAGNOSIS — S8012XA Contusion of left lower leg, initial encounter: Secondary | ICD-10-CM | POA: Diagnosis not present

## 2016-06-23 DIAGNOSIS — Z9981 Dependence on supplemental oxygen: Secondary | ICD-10-CM | POA: Diagnosis not present

## 2016-06-23 DIAGNOSIS — N179 Acute kidney failure, unspecified: Secondary | ICD-10-CM | POA: Diagnosis not present

## 2016-06-23 DIAGNOSIS — Z8673 Personal history of transient ischemic attack (TIA), and cerebral infarction without residual deficits: Secondary | ICD-10-CM | POA: Diagnosis not present

## 2016-06-23 DIAGNOSIS — Z7401 Bed confinement status: Secondary | ICD-10-CM | POA: Diagnosis not present

## 2016-06-23 DIAGNOSIS — E662 Morbid (severe) obesity with alveolar hypoventilation: Secondary | ICD-10-CM | POA: Diagnosis present

## 2016-06-23 DIAGNOSIS — E870 Hyperosmolality and hypernatremia: Secondary | ICD-10-CM | POA: Diagnosis not present

## 2016-06-23 DIAGNOSIS — E1122 Type 2 diabetes mellitus with diabetic chronic kidney disease: Secondary | ICD-10-CM | POA: Diagnosis present

## 2016-06-23 DIAGNOSIS — R7881 Bacteremia: Secondary | ICD-10-CM | POA: Diagnosis present

## 2016-06-23 DIAGNOSIS — D62 Acute posthemorrhagic anemia: Secondary | ICD-10-CM | POA: Diagnosis not present

## 2016-06-23 DIAGNOSIS — I480 Paroxysmal atrial fibrillation: Secondary | ICD-10-CM | POA: Diagnosis not present

## 2016-06-23 DIAGNOSIS — N189 Chronic kidney disease, unspecified: Secondary | ICD-10-CM | POA: Diagnosis not present

## 2016-06-23 DIAGNOSIS — R402212 Coma scale, best verbal response, none, at arrival to emergency department: Secondary | ICD-10-CM | POA: Diagnosis present

## 2016-06-23 DIAGNOSIS — I5042 Chronic combined systolic (congestive) and diastolic (congestive) heart failure: Secondary | ICD-10-CM | POA: Diagnosis not present

## 2016-06-23 DIAGNOSIS — E785 Hyperlipidemia, unspecified: Secondary | ICD-10-CM | POA: Diagnosis present

## 2016-06-23 LAB — HEPARIN LEVEL (UNFRACTIONATED)
Heparin Unfractionated: 0.7 IU/mL (ref 0.30–0.70)
Heparin Unfractionated: 0.77 IU/mL — ABNORMAL HIGH (ref 0.30–0.70)

## 2016-06-23 LAB — URINE CULTURE

## 2016-06-23 LAB — CBC
HEMATOCRIT: 33.3 % — AB (ref 36.0–46.0)
Hemoglobin: 10.4 g/dL — ABNORMAL LOW (ref 12.0–15.0)
MCH: 28.6 pg (ref 26.0–34.0)
MCHC: 31.2 g/dL (ref 30.0–36.0)
MCV: 91.5 fL (ref 78.0–100.0)
PLATELETS: 207 10*3/uL (ref 150–400)
RBC: 3.64 MIL/uL — AB (ref 3.87–5.11)
RDW: 15 % (ref 11.5–15.5)
WBC: 17.9 10*3/uL — AB (ref 4.0–10.5)

## 2016-06-23 LAB — COMPREHENSIVE METABOLIC PANEL
ALT: 9 U/L — AB (ref 14–54)
AST: 20 U/L (ref 15–41)
Albumin: 2.3 g/dL — ABNORMAL LOW (ref 3.5–5.0)
Alkaline Phosphatase: 94 U/L (ref 38–126)
Anion gap: 11 (ref 5–15)
BILIRUBIN TOTAL: 0.6 mg/dL (ref 0.3–1.2)
BUN: 36 mg/dL — AB (ref 6–20)
CO2: 18 mmol/L — ABNORMAL LOW (ref 22–32)
CREATININE: 1.72 mg/dL — AB (ref 0.44–1.00)
Calcium: 8.3 mg/dL — ABNORMAL LOW (ref 8.9–10.3)
Chloride: 114 mmol/L — ABNORMAL HIGH (ref 101–111)
GFR, EST AFRICAN AMERICAN: 35 mL/min — AB (ref >60)
GFR, EST NON AFRICAN AMERICAN: 30 mL/min — AB (ref >60)
Glucose, Bld: 88 mg/dL (ref 65–99)
Potassium: 4.2 mmol/L (ref 3.5–5.1)
Sodium: 143 mmol/L (ref 135–145)
TOTAL PROTEIN: 5.7 g/dL — AB (ref 6.5–8.1)

## 2016-06-23 LAB — TSH: TSH: 1.836 u[IU]/mL (ref 0.350–4.500)

## 2016-06-23 LAB — MRSA PCR SCREENING: MRSA by PCR: NEGATIVE

## 2016-06-23 LAB — MAGNESIUM: MAGNESIUM: 1.9 mg/dL (ref 1.7–2.4)

## 2016-06-23 LAB — PHOSPHORUS: PHOSPHORUS: 3.6 mg/dL (ref 2.5–4.6)

## 2016-06-23 MED ORDER — ADENOSINE 6 MG/2ML IV SOLN
INTRAVENOUS | Status: AC
  Administered 2016-06-23: 6 mg
  Filled 2016-06-23: qty 2

## 2016-06-23 MED ORDER — METOPROLOL TARTRATE 5 MG/5ML IV SOLN
INTRAVENOUS | Status: AC
  Filled 2016-06-23: qty 10

## 2016-06-23 MED ORDER — FAMOTIDINE IN NACL 20-0.9 MG/50ML-% IV SOLN
20.0000 mg | Freq: Two times a day (BID) | INTRAVENOUS | Status: DC
  Administered 2016-06-23 – 2016-06-25 (×5): 20 mg via INTRAVENOUS
  Filled 2016-06-23 (×5): qty 50

## 2016-06-23 MED ORDER — HEPARIN (PORCINE) IN NACL 100-0.45 UNIT/ML-% IJ SOLN
950.0000 [IU]/h | INTRAMUSCULAR | Status: DC
  Administered 2016-06-23 (×2): 1200 [IU]/h via INTRAVENOUS
  Filled 2016-06-23: qty 250

## 2016-06-23 MED ORDER — METOPROLOL TARTRATE 5 MG/5ML IV SOLN
5.0000 mg | Freq: Three times a day (TID) | INTRAVENOUS | Status: DC
  Administered 2016-06-23: 5 mg via INTRAVENOUS

## 2016-06-23 MED ORDER — METOPROLOL TARTRATE 5 MG/5ML IV SOLN
5.0000 mg | Freq: Four times a day (QID) | INTRAVENOUS | Status: DC
  Administered 2016-06-23 – 2016-06-27 (×17): 5 mg via INTRAVENOUS
  Filled 2016-06-23 (×17): qty 5

## 2016-06-23 MED ORDER — SODIUM CHLORIDE 0.9 % IV SOLN
100.0000 mg | Freq: Two times a day (BID) | INTRAVENOUS | Status: DC
  Administered 2016-06-23 – 2016-06-26 (×7): 100 mg via INTRAVENOUS
  Filled 2016-06-23 (×13): qty 10

## 2016-06-23 MED ORDER — METOCLOPRAMIDE HCL 5 MG/ML IJ SOLN
5.0000 mg | Freq: Three times a day (TID) | INTRAMUSCULAR | Status: DC
  Administered 2016-06-23 – 2016-06-28 (×16): 5 mg via INTRAVENOUS
  Filled 2016-06-23 (×16): qty 2

## 2016-06-23 MED ORDER — SODIUM CHLORIDE 0.9 % IV SOLN
200.0000 mg | Freq: Once | INTRAVENOUS | Status: AC
  Administered 2016-06-23: 200 mg via INTRAVENOUS
  Filled 2016-06-23: qty 20

## 2016-06-23 MED ORDER — METOPROLOL TARTRATE 5 MG/5ML IV SOLN
2.5000 mg | Freq: Once | INTRAVENOUS | Status: AC
  Administered 2016-06-24: 2.5 mg via INTRAVENOUS
  Filled 2016-06-23: qty 5

## 2016-06-23 NOTE — Plan of Care (Signed)
Problem: Education: Goal: Knowledge of [facility] Education information/materials will improve Outcome: Not Met (add Reason) Pt is not oriented   Problem: Safety: Goal: Ability to remain free from injury will improve Outcome: Progressing Pt under seizure precaution and bed alarm applied  Problem: Fluid Volume: Goal: Ability to maintain a balanced intake and output will improve Outcome: Not Progressing Pt NPO failed swallow screen  Problem: Nutrition: Goal: Adequate nutrition will be maintained Outcome: Not Progressing Pt NPO

## 2016-06-23 NOTE — Progress Notes (Signed)
Subjective:   [name] is resting in bed she is intubated she is not responding to commands she appears to have a slight left facial droop and slightly decreased withdrawal in the left upper extremity. Her EEG has been reviewed. She is presently on long-term monitoring. The initial EEG activity revealed generalized epileptic discharges. At present [name] is noted to have PLEDs involving the right hemisphere. Given the specific details of [name]'s history presentation is suggestive of right cerebral dysfunction and very likely an ischemic event.  Exam: Filed Vitals:   06/23/16 0603 06/23/16 0725  BP: 180/79 160/82  Pulse: 92 96  Temp:  100.7 F (38.2 C)  Resp: 29 36    HEENT-  Normocephalic, no lesions, without obvious abnormality.  Normal external eye and conjunctiva.  Normal TM's bilaterally.  Normal auditory canals and external ears. Normal external nose, mucus membranes and septum.  Normal pharynx. Cardiovascular- irregularly irregular rhythm, pulses palpable throughout   Lungs- abnormal lung sounds clear with coughing, bilateral basilar rales heard, Heart exam - S1, S2 normal, no murmur, no gallop, rate regular Abdomen- NT/ND Extremities- less then 2 second capillary refill Lymph-no adenopathy palpable Musculoskeletal-no joint tenderness, deformity or swelling Skin-warm and dry, no hyperpigmentation, vitiligo, or suspicious lesions   Neurological exam: Gen: In bed, NAD MS: [name] is not following any commands. She does not respond to request to move extremities. CN: Pupils are equal round and reactive. There is a right gaze preference. [name] appears to have a slight left facial droop Motor: There is slight withdrawal on the right but no clear evidence of withdrawal on the left there is no spontaneous movement of the extremities Sensory: Grossly present to DPS   Pertinent Labs/Diagnostics: reviewed   Impression:   [name] is presently in the intensive care unit and is intubated.  After her evaluation in the emergency room she subsequently began to have some seizure activity. An EEG was requested and revealed generalized epileptic discharges. [name] has subsequently been given Depakote as well as Vimpat. She also continues on the [place].  The long-term video EEG monitoring now reveals the presence of PLEDs. This is a form of intermittent interictal epileptiform discharge that is most commonly seen in the association of underlying cerebral injury and/or dysfunction. On exam [name] appears to have a slight left facial droop and slightly decreased withdrawal in the left upper extremity. These findings are suggestive of dysfunction of the right cerebral hemisphere. Given the history of risk factors for stroke this likely represents an ischemic event. At this point it would be reasonable to proceed with additional imaging. MRI should be performed when possible.  The EEG reveals no evidence of ongoing active epileptic activity at this time. There is no clinical evidence of ongoing seizures at this point.   Recommendations:  1. Continue with current antiepileptic therapy including Keppra, Depakote, and Vimpat.  2. Proceed with MRI of the brain for suspected stroke when possible.  3. Check Depakote level tomorrow morning.   James A. Tasia Catchings, M.D. Neurohospitalist Phone: [phone]   06/23/2016, 10:45 AM

## 2016-06-23 NOTE — Progress Notes (Signed)
ANTICOAGULATION CONSULT NOTE - Follow-up Consult  Pharmacy Consult for heparin Indication: r/o PE  Allergies  Allergen Reactions  . Daptomycin Other (See Comments)    Elevated CPK  . Morphine And Related Nausea And Vomiting  . Cephalexin Rash  . Tramadol Nausea And Vomiting  . Atenolol     unknown  . Cephalosporins Other (See Comments)    Unknown  . Dapsone Other (See Comments)    unknown  . Erythromycin     unknown  . Erythromycin Base Other (See Comments)    Unknown  . Celecoxib Other (See Comments)    Unknown, can use furosemide without issue  . Codeine Other (See Comments)    unknown  . Fluoxetine Hcl Other (See Comments)    unknown  . Latex Other (See Comments)  . Ofloxacin Other (See Comments)    unknown  . Penicillins Other (See Comments)    Unknown  Has patient had a PCN reaction causing immediate rash, facial/tongue/throat swelling, SOB or lightheadedness with hypotension: unknown Has patient had a PCN reaction causing severe rash involving mucus membranes or skin necrosis: unknown Has patient had a PCN reaction that required hospitalization unknown Has patient had a PCN reaction occurring within the last 10 years: unknown If all of the above answers are "NO", then may proceed with Cephalosporin use.  [name] Rofecoxib Other (See Comments)    unknown  . Sulfonamide Derivatives Other (See Comments)    unknown    Patient Measurements: Height: 5\' 3"  (160 cm) Weight: 163 lb 12.8 oz (74.3 kg) IBW/kg (Calculated) : 52.4 Heparin Dosing Weight: 76kg  Vital Signs: Temp: 98.7 F (37.1 C) (07/16 1516) Temp Source: Axillary (07/16 1516) BP: 154/61 mmHg (07/16 1533) Pulse Rate: 98 (07/16 1533)  Labs:  Recent Labs  06/22/16 1240 06/22/16 1255 06/22/16 2323 06/23/16 0300 06/23/16 0814 06/23/16 1845  HGB 10.2* 10.5*  --  10.4*  --   --   HCT 32.9* 31.0*  --  33.3*  --   --   PLT 231  --   --  207  --   --   LABPROT 12.8  --   --   --   --   --   INR 0.94  --    --   --   --   --   HEPARINUNFRC  --   --  1.10*  --  0.70 0.77*  CREATININE 1.63* 1.70*  --  1.72*  --   --     Estimated Creatinine Clearance: 31.1 mL/min (by C-G formula based on Cr of 1.72).   Assessment: 65 yo female on heparin IV for r/o PE. VQ scan is ordered. The heparin level is above goal (HL= 0.77) on 1100 units/hr.  Goal of Therapy:  Heparin level 0.3-0.7 units/ml Monitor platelets by anticoagulation protocol: Yes   Plan:  -Decrease infusion to 950 units/hr -Heparin level and CBC in am  Hildred Laser, Pharm D 06/23/2016 7:27 PM

## 2016-06-23 NOTE — Progress Notes (Signed)
PROGRESS NOTE    [name]  [id number] DOB: 1951/01/02 DOA: 06/22/2016 PCP: Kandice Hams, MD    Brief Narrative:  Patient is a 65 year old female bedbound for the past 3 years nursing home resident with history of severe debility, atrial fibrillation, hypertension, chronic kidney disease stage III, obstructive sleep apnea, CHF, morbid obesity, history of seizures presented to the ED with status epilepticus. Patient initially loaded with Keppra and placed on IV Keppra twice daily. Patient had also presented with shortness of breath and tachypnea. Due to patient's mobility status, presentation was shortness of breath and tachypnea concern was for PE and a such VQ scan was ordered. On route to VQ scan patient noted to have another episode of seizures and had to be loaded with IV Depakote and IV Keppra and doses of Keppra increased and patient placed on Depakote as well as Vimpat. Patient also on heparin drip. Patient opens eyes to verbal stimuli however not following commands and nonresponsive which her husband is not her baseline. Patient also noted on the night of 06/22/2016 to go into SVT requiring adenosine and now on scheduled IV Lopressor. Neurology and cardiology have been consulted.   Assessment & Plan:   Principal Problem:   Status epilepticus ([place]) Active Problems:   PAF (paroxysmal atrial fibrillation) (HCC)   Chronic combined systolic and diastolic CHF (congestive heart failure) (HCC)   SOB (shortness of breath)   Total self-care deficit   AKI (acute kidney injury) (HCC)   CKD (chronic kidney disease) stage 3, GFR 30-59 ml/min   Malignant HTN with heart disease, w/o CHF, with chronic kidney disease   SVT (supraventricular tachycardia) ([place])  #1 status epilepticus Patient presented with generalized tonic-clonic seizures after episode of confusion and disorientation. Patient was given a loading dose of Keppra and placed on Keppra 750 mg twice daily. CT head which was  done showed prior bilateral occipital ischemic changes. There was also concern that patient may have had arrhythmias which may have led to decreased cerebral perfusion provoking seizures secondary to bilateral occipital ischemic changes noted. Overnight patient was noted to have episode of seizures on route to VQ scan and received Ativan at that time. Neurology was called and patient was loaded with 1 g of IV Depakote followed by 500 mg of IV Depakote twice daily as well as IV Keppra and Keppra dose increased to 1 g IV twice daily. Patient was also given IV Vimpat and placed on 100 mg of Vimpat twice daily. Patient with no signs of infectious etiology. Patient with low-grade fever this morning and also leukocytosis however these may have likely been secondary to seizure episodes. Neurology following.  #2 SVT Patient noted to go into SVT overnight and had to be given adenosine 6 mg IV 1 with improvement and subsequently placed on scheduled Lopressor. Will check a TSH. Check a 2-D echo. Continue scheduled IV Lopressor. Consult with cardiology for further evaluation and management.  #3 paroxysmal atrial fibrillation Currently rate controlled on scheduled IV Lopressor. Patient not really following commands and as such unable to give oral medications. Patient currently on heparin drip pending VQ scan.  #4 shortness of breath/tachypnea Patient presented with shortness of breath and tachypnea with a history of being bedbound for approximately 3 years. Consent for pulmonary emboli. VQ scan was ordered however on route to VQ scan patient had seizures and a such VQ scan is still pending. Continue empiric IV heparin drip. Repeat chest x-ray this morning. Follow.  #5 malignant hypertension in the  setting of chronic kidney disease Patient currently not following commands and a such unable to get oral medications. Continue scheduled IV Lopressor. Hydralazine as needed.  #6 chronic kidney disease stage III Stable.  Follow.  #7 leukocytosis Likely secondary to acute seizures. Patient satting 98% on 2.5 L nasal cannula. Repeat chest x-ray pending. Urinalysis was nitrite negative leukocytes negative. Patient with low-grade temp of 100.7. Will hold off on antibiotics at this time and await chest x-ray and follow.  #8 combined chronic systolic and diastolic heart failure Currently stable.    DVT prophylaxis: (Lovenox/Heparin/SCD's/anticoagulated/None (if comfort care) Code Status: Full Family Communication: Updated husband at bedside. Disposition Plan: Remain the step down unit. Back to nursing facility when medically stable.   Consultants:   Neurology: Dr. Tasia Catchings 06/22/2016  Cardiology  Procedures:  EEG 06/23/2016  CT head 06/22/2016  Chest x-ray 06/22/2016, 06/23/2016  Antimicrobials:   None   Subjective: Patient currently getting the EEG. Patient sleeping out of opens eyes to verbal stimuli and eyes deviated to the right. Patient nonverbal which per husband does not have baseline. Patient somewhat lethargic. Events overnight noted with recurrent seizures and SVT.  Objective: Filed Vitals:   06/23/16 0558 06/23/16 0600 06/23/16 0603 06/23/16 0725  BP: 165/151 160/115 180/79 160/82  Pulse: 88 97 92 96  Temp:    100.7 F (38.2 C)  TempSrc:    Axillary  Resp: 27 29 29  36  Height:      Weight:      SpO2: 98% 97% 98% 98%    Intake/Output Summary (Last 24 hours) at 06/23/16 1000 Last data filed at 06/23/16 0947  Gross per 24 hour  Intake 2079.87 ml  Output      0 ml  Net 2079.87 ml   Filed Weights   06/22/16 1331 06/22/16 1931  Weight: 101.606 kg (224 lb) 74.3 kg (163 lb 12.8 oz)    Examination:  General exam: Sleeping. Opens eyes to verbal stimuli. Respiratory system: Clear to auscultation anterior lung fields. Respiratory effort normal. Cardiovascular system: Irregularly irregular. No JVD, murmurs, rubs, gallops or clicks. No pedal edema. Gastrointestinal system:  Abdomen is nondistended, soft and nontender. No organomegaly or masses felt. Normal bowel sounds heard. Central nervous system: Alert and oriented. No focal neurological deficits. Extremities: Symmetric 5 x 5 power. Skin: No rashes, lesions or ulcers Psychiatry: Judgement and insight appear poor. Mood & affect flat.     Data Reviewed: I have personally reviewed following labs and imaging studies  CBC:  Recent Labs Lab 06/22/16 1240 06/22/16 1255 06/23/16 0300  WBC 12.1*  --  17.9*  NEUTROABS 9.6*  --   --   HGB 10.2* 10.5* 10.4*  HCT 32.9* 31.0* 33.3*  MCV 92.9  --  91.5  PLT 231  --  [id number]   Basic Metabolic Panel:  Recent Labs Lab 06/22/16 1240 06/22/16 1255 06/23/16 0300  NA 142 145 143  K 3.4* 3.4* 4.2  CL 113* 111 114*  CO2 20*  --  18*  GLUCOSE 185* 185* 88  BUN 38* 45* 36*  CREATININE 1.63* 1.70* 1.72*  CALCIUM 8.5*  --  8.3*  MG  --   --  1.9  PHOS  --   --  3.6   GFR: Estimated Creatinine Clearance: 31.1 mL/min (by C-G formula based on Cr of 1.72). Liver Function Tests:  Recent Labs Lab 06/22/16 1240 06/23/16 0300  AST 23 20  ALT 9* 9*  ALKPHOS 106 94  BILITOT 0.7 0.6  PROT  6.3* 5.7*  ALBUMIN 2.8* 2.3*   No results for input(s): LIPASE, AMYLASE in the last 168 hours. No results for input(s): AMMONIA in the last 168 hours. Coagulation Profile:  Recent Labs Lab 06/22/16 1240  INR 0.94   Cardiac Enzymes: No results for input(s): CKTOTAL, CKMB, CKMBINDEX, TROPONINI in the last 168 hours. BNP (last 3 results) No results for input(s): PROBNP in the last 8760 hours. HbA1C: No results for input(s): HGBA1C in the last 72 hours. CBG: No results for input(s): GLUCAP in the last 168 hours. Lipid Profile: No results for input(s): CHOL, HDL, LDLCALC, TRIG, CHOLHDL, LDLDIRECT in the last 72 hours. Thyroid Function Tests: No results for input(s): TSH, T4TOTAL, FREET4, T3FREE, THYROIDAB in the last 72 hours. Anemia Panel: No results for input(s):  VITAMINB12, FOLATE, FERRITIN, TIBC, IRON, RETICCTPCT in the last 72 hours. Sepsis Labs:  Recent Labs Lab 06/22/16 1255  LATICACIDVEN 1.44    Recent Results (from the past 240 hour(s))  MRSA PCR Screening     Status: None   Collection Time: 06/23/16  1:03 AM  Result Value Ref Range Status   MRSA by PCR NEGATIVE NEGATIVE Final    Comment:        The GeneXpert MRSA Assay (FDA approved for NASAL specimens only), is one component of a comprehensive MRSA colonization surveillance program. It is not intended to diagnose MRSA infection nor to guide or monitor treatment for MRSA infections.          Radiology Studies: Ct Head Wo Contrast  06/22/2016  CLINICAL DATA:  Unresponsive at nursing home.  Seizures. EXAM: CT HEAD WITHOUT CONTRAST TECHNIQUE: Contiguous axial images were obtained from the base of the skull through the vertex without intravenous contrast. 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 There is no evidence for acute hemorrhage, hydrocephalus, mass lesion, or abnormal extra-axial fluid collection. No definite CT evidence for acute infarction. Diffuse loss of parenchymal volume is consistent with atrophy. Patchy low attenuation in the deep hemispheric and periventricular white matter is nonspecific, but likely reflects chronic microvascular ischemic demyelination. The visualized paranasal sinuses and mastoid air cells are clear. IMPRESSION: Stable.  Atrophy with chronic small vessel white matter disease. 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 Electronically Signed   By: Misty Stanley M.D.   On: 06/22/2016 13:48   Dg Chest Port 1 View  06/22/2016  CLINICAL DATA:  Grand mal seizure.  Possible aspiration. EXAM: PORTABLE CHEST 1 VIEW COMPARISON:  01/03/2016 FINDINGS: There is moderate cardiac enlargement. Aortic atherosclerosis noted. Asymmetric opacification within the right midlung and right upper lobe is  identify. Mild diffuse pulmonary vascular congestion noted. IMPRESSION: 1. Cardiac enlargement and aortic atherosclerosis. 2. Mild asymmetric increased opacification within the right upper lobe and right midlung which may reflect underlying aspiration, pneumonia or asymmetric edema. Electronically Signed   By: Kerby Moors M.D.   On: 06/22/2016 14:28        Scheduled Meds: . acetaminophen  500 mg Oral Q6H  . allopurinol  100 mg Oral Daily  . amLODipine  10 mg Oral QHS  . atorvastatin  20 mg Oral QHS  . dextrose 5 % and 0.45% NaCl   Intravenous STAT  . famotidine (PEPCID) IV  20 mg Intravenous Q12H  . hydrALAZINE  100 mg Oral Q8H  . isosorbide dinitrate  30 mg Oral BID  . lacosamide (VIMPAT) IV  100 mg Intravenous Q12H  . levETIRAcetam  1,000 mg Intravenous Q12H  . metoCLOPramide (  REGLAN) injection  5 mg Intravenous Q8H  . metoprolol      . metoprolol  5 mg Intravenous Q6H  . polyethylene glycol  17 g Oral BID  . sodium chloride flush  3 mL Intravenous Q12H  . traZODone  50 mg Oral QHS  . valproate sodium  500 mg Intravenous Q12H   Continuous Infusions: . sodium chloride 50 mL/hr at 06/23/16 0947  . heparin 1,200 Units/hr (06/23/16 0947)        Time spent: [place], MD [facility] Pager [phone]  If 7PM-7AM, please contact night-coverage [email or web address] Password [email or web address] 06/23/2016, 10:00 AM

## 2016-06-23 NOTE — Progress Notes (Signed)
ANTICOAGULATION CONSULT NOTE - Follow-up Consult  Pharmacy Consult for heparin Indication: r/o PE  Allergies  Allergen Reactions  . Daptomycin Other (See Comments)    Elevated CPK  . Morphine And Related Nausea And Vomiting  . Cephalexin Rash  . Tramadol Nausea And Vomiting  . Atenolol     unknown  . Cephalosporins Other (See Comments)    Unknown  . Dapsone Other (See Comments)    unknown  . Erythromycin     unknown  . Erythromycin Base Other (See Comments)    Unknown  . Morphine Nausea And Vomiting  . Celecoxib Other (See Comments)    Unknown, can use furosemide without issue  . Cephalexin Rash  . Codeine Other (See Comments)    unknown  . Fluoxetine Hcl Other (See Comments)    unknown  . Latex Other (See Comments)  . Ofloxacin Other (See Comments)    unknown  . Penicillins Other (See Comments)    Unknown  Has patient had a PCN reaction causing immediate rash, facial/tongue/throat swelling, SOB or lightheadedness with hypotension: unknown Has patient had a PCN reaction causing severe rash involving mucus membranes or skin necrosis: unknown Has patient had a PCN reaction that required hospitalization unknown Has patient had a PCN reaction occurring within the last 10 years: unknown If all of the above answers are "NO", then may proceed with Cephalosporin use.  [name] Rofecoxib Other (See Comments)    unknown  . Sulfonamide Derivatives Other (See Comments)    unknown    Patient Measurements: Height: 5\' 3"  (160 cm) Weight: 163 lb 12.8 oz (74.3 kg) IBW/kg (Calculated) : 52.4 Heparin Dosing Weight: 76kg  Vital Signs: Temp: 99.7 F (37.6 C) (07/15 2335) Temp Source: Oral (07/15 2335) BP: 166/79 mmHg (07/15 2335) Pulse Rate: 133 (07/15 2335)  Labs:  Recent Labs  06/22/16 1240 06/22/16 1255 06/22/16 2323  HGB 10.2* 10.5*  --   HCT 32.9* 31.0*  --   PLT 231  --   --   LABPROT 12.8  --   --   INR 0.94  --   --   HEPARINUNFRC  --   --  1.10*  CREATININE 1.63*  1.70*  --     Estimated Creatinine Clearance: 31.5 mL/min (by C-G formula based on Cr of 1.7).   Assessment: 73 yof on heparin IV for r/o PE. VQ scan is pending. Heparin level supratherapeutic on 1200 units/hr. No bleeding noted. RN confirmed that level drawn from arm opposite where heparin infusing.  Goal of Therapy:  Heparin level 0.3-0.7 units/ml Monitor platelets by anticoagulation protocol: Yes   Plan:  Hold heparin x 1 hour then restart at 1000 units/hr Will f/u 8 hr heparin level F/u VQ results  Sherlon Handing, PharmD, BCPS Clinical pharmacist, pager [phone] 06/23/2016,12:06 AM

## 2016-06-23 NOTE — Progress Notes (Signed)
eLink Physician-Brief Progress Note Patient Name: [name] DOB: 11/30/1951 MRN: [id number]   Date of Service  06/23/2016  HPI/Events of Note  Call from bedside nurse reporting SVT in patient with HR in the 180s to 190s.  Hypertensive but patient in distress with SOB.    eICU Interventions  Given 6 mg adenosine with pause and resumption of HR in the 120s.  BP of 173/93.  Patient on home lopressor which has not been given due to NPO status. Order for lopressor 5 mg IV q8 hours Primary team MD at bedside.     Intervention Category Major Interventions: Arrhythmia - evaluation and management  Shoshanna Mcquitty 06/23/2016, 5:51 AM

## 2016-06-23 NOTE — Consult Note (Signed)
[place] Nurse wound consult note Reason for Consult:Moisture associated skin damage (MASD), specifically intertriginous dermatitis (ITD) in the inframammary and sub pannicular areas.  Also, with incontinence associated skin damage (IAD) on the mid-back. Wound type:Moisture Pressure Ulcer POA: No Measurement: N/A Wound bed: Back with dry, cracking dermatitis and pinpoint small openings revealing dry, pink wounds. Scant serous exudate noted on bed linens. Inframammary and sub pannicular areas are erythematous, unbroken Drainage (amount, consistency, odor) All skin folds with poor hygiene and musty odor, no exudate. Periwound: Dry, intact Dressing procedure/placement/frequency: I have provided Nursing with guidance for skin care that includes use of our house antimicrobial textile for wicking moisture out of skin folds, InterDry Ag+. Overall hygiene to be provided using our house pH balanced, no rinse skin conditioning cleanser, EasiCleanse. The area affected by urine exposure on the back will receive topical care with our moisture barrier ointment, Critic Aid.  All care is being supplemented by routine turning and repositioning. [place] nursing team will not follow, but will remain available to this patient, the nursing and medical teams.  Please re-consult if needed. Thanks, Maudie Flakes, MSN, RN, [place], Arther Abbott  Pager# [phone]

## 2016-06-23 NOTE — Progress Notes (Signed)
STAT EEG ordered, changed to LTM during study.

## 2016-06-23 NOTE — Significant Event (Signed)
Rapid Response Event Note  Overview: Time Called: [id number] Arrival Time: 0541 Event Type: Cardiac  Initial Focused Assessment: Rapid heart beat 190-200   Interventions: Adenosine 6mg  and Lopressor 5mg  IVP  Plan of Care (if not transferred):   Event Summary: Called to assist with care of patient with HR of 190-200. Suspected patient was in SVT. Patient has been and remains nonverbal since being on the unit. Not following any commands. Skin warm and dry. Some dyspnea noted. Heart rate regular and tachycardic. On-call provider was paged. Meanwhile Dr. Jimmy Footman was made aware of patient condition due to level of distress. Orders were given for Adenosine 6mg . Heart rhythm converted to sinus tach at rate of 120's. Patient was given IV Lopressor 5mg  and heart rate reduced to 80 sinus rhythm. Level of distress decreased after heart rate conversion. We will continue to assist with care as needed.                   Eino Farber [place]

## 2016-06-23 NOTE — Progress Notes (Signed)
SLP Cancellation Note  Patient Details Name: [name] MRN: [id number] DOB: 12-15-1950   Cancelled treatment:       Reason Eval/Treat Not Completed: Patient not medically ready; Pt not following commands; will closely monitor for PO readiness  Arvil Chaco MA, [place], [place] 06/23/2016, 10:18 AM

## 2016-06-23 NOTE — Consult Note (Signed)
Reason for Consult: PSVT   Referring Physician: Dr Grandville Silos   PCP:  Kandice Hams, MD  Primary Cardiologist:Last seen 12/2014 in HF clinic  [name] is an 65 y.o. female.    Chief Complaint:  Pt's husband concerned for her confusion.     HPI:  65 year old female with chronic diastolic HF, CKD, CVA, PAF and OHS/OSA on home oxygen.  CHA2DS2VASc score is at least 6.    Not on anticoagulants due to GI bleed and severe hematoma. Echo in 11/14 showed EF 50-55%. She has had multiple CHF admissions in the past for diastolic CHF exacerbation. She lives in a nursing home and is now bedridden.  She was admitted with seizures in 12/2015-she was DNR at that time and palliative care was involved.  Now she is a full code.    She presented yesterday to ER with episode of confusion and disorientation and seizure activity.  She had seizures in ER and then on arrival to 3S continued with seizures.  HR was 130-140s     Neurology noted this was suggestive of Rt cerebral dysfunction and likely ischemic event.   EKG 06/22/16 ST in 120s with PACs.  Strips reviewed on tele with ST with PACs though HR up to 140s.  She has had 3 sec pause early AM.  She is on Heparin.   EKG today ST with PACs possible inf ischemia but improved from EKG in 12/2015 with a fib.    In 2013 she had a negative nuc study. Last Echo 10/2013 EF 50-55%, g1DD. Otherwise fairly normal.  Per her husband prior to this event no complaints of chest pain or SOB.  Past Medical History  Diagnosis Date  . Asthma   . Bronchitis   . Allergic rhinitis   . HTN (hypertension)   . Hyperlipidemia   . Obesity   . OSA (obstructive sleep apnea)     poor compliance with cpap  . CHF (congestive heart failure) ([place])   . Biventricular failure ([place])     compensated  . Psoriasis   . Stasis edema     of lower extremities  . Depression   . Situational stress   . Anemia   . Gout   . Chronic anticoagulation     off due to GIB,  thigh hematoma  . Yeast infection involving the vagina and surrounding area   . Atrial fibrillation with RVR ([place])   . GERD (gastroesophageal reflux disease)   . Chronic bronchitis ([place])     "get it q yr" (08/10/2013)  . Shortness of breath     "all the time" (08/10/2013)  . On home oxygen therapy     "2L during the night and prn during the day" (08/10/2013)  . Type II diabetes mellitus ([place])   . History of blood transfusion     "few during my lifetime; last time was 4 days ago when I got 2 units" (08/10/2013)  . Stroke [facility]) ?2007    denies residuals on 08/10/2013  . DJD (degenerative joint disease)   . Bleeding     "from my skin folds; just won't stop" (08/10/2013)  . Complication of anesthesia     "they gave me too much; I stayed out of it for awhile" (08/10/2013)  . Anxiety   . Kidney stones   . Kidney disease, chronic, stage III (GFR 30-59 ml/min)   . Chronic kidney disease, stage IV (severe) ([place])   .  Morbid obesity ([place])   . Diastolic CHF ([place])   . Pneumonia     "said I did on 08/03/2013 then they ruled it out" (08/10/2013)  . HCAP (healthcare-associated pneumonia) 01/03/2016  . Seizures [facility])     Past Surgical History  Procedure Laterality Date  . Appendectomy    . Cholecystectomy    . Total knee arthroplasty Left ~ 2006  . Total abdominal hysterectomy    . Back surgery    . Cystectomy Left     hand  . Tonsillectomy    . Joint replacement    . Lumbar disc surgery    . Colonoscopy N/A 08/16/2013    Procedure: COLONOSCOPY;  Surgeon: Beryle Beams, MD;  Location: [place];  Service: Endoscopy;  Laterality: N/A;  . Hematoma evacuation Right 09/10/2013    Procedure: EVACUATION OF RIGHT LEG HEMATOMA AND EXCISION [place] RIGHT LEG ;  Surgeon: Merrie Roof, MD;  Location: [facility];  Service: General;  Laterality: Right;  . Debridement and closure wound Right 09/22/2013    Procedure: IRRIGATION AND DEBRIDEMENT SURGICAL PREP PLACEMENT OF [place]  ;  Surgeon: Irene Limbo, MD;  Location: [facility];  Service: Plastics;  Laterality: Right;  . Skin split graft Right 10/07/2013    Procedure: SPLIT THICKNESS SKIN GRAFT RIGHT THIGH TO RIGHT LEG, PLACEMENT OF A CEL AND WOUND VAC TO RIGHT THIGH  ;  Surgeon: Irene Limbo, MD;  Location: [facility];  Service: Plastics;  Laterality: Right;    Family History  Problem Relation Age of Onset  . Heart attack Father   . Asthma Father   . Heart disease Paternal Uncle   . Rectal cancer Paternal Aunt   . Other Mother     mva   Social History:  reports that she quit smoking about 33 years ago. Her smoking use included Cigarettes. She has a 1.44 pack-year smoking history. She has quit using smokeless tobacco. She reports that she does not drink alcohol or use illicit drugs.  Allergies:  Allergies  Allergen Reactions  . Daptomycin Other (See Comments)    Elevated CPK  . Morphine And Related Nausea And Vomiting  . Cephalexin Rash  . Tramadol Nausea And Vomiting  . Atenolol     unknown  . Cephalosporins Other (See Comments)    Unknown  . Dapsone Other (See Comments)    unknown  . Erythromycin     unknown  . Erythromycin Base Other (See Comments)    Unknown  . Celecoxib Other (See Comments)    Unknown, can use furosemide without issue  . Codeine Other (See Comments)    unknown  . Fluoxetine Hcl Other (See Comments)    unknown  . Latex Other (See Comments)  . Ofloxacin Other (See Comments)    unknown  . Penicillins Other (See Comments)    Unknown  Has patient had a PCN reaction causing immediate rash, facial/tongue/throat swelling, SOB or lightheadedness with hypotension: unknown Has patient had a PCN reaction causing severe rash involving mucus membranes or skin necrosis: unknown Has patient had a PCN reaction that required hospitalization unknown Has patient had a PCN reaction occurring within the last 10 years: unknown If all of the above answers are "NO", then may proceed with Cephalosporin use.  [name]  Rofecoxib Other (See Comments)    unknown  . Sulfonamide Derivatives Other (See Comments)    unknown    OUTPATIENT MEDICATIONS: No current facility-administered medications on file prior to encounter.  Current Outpatient Prescriptions on File Prior to Encounter  Medication Sig Dispense Refill  . acetaminophen (TYLENOL) 500 MG tablet Take 500 mg by mouth every 6 (six) hours.    [name] allopurinol (ZYLOPRIM) 100 MG tablet Take 100 mg by mouth daily. Patient takes at 0800am daily    . amLODipine (NORVASC) 10 MG tablet Take 10 mg by mouth at bedtime.     [name] atorvastatin (LIPITOR) 20 MG tablet Take 20 mg by mouth at bedtime. Patient takes at 2100    . ferrous sulfate 325 (65 FE) MG tablet Take 325 mg by mouth 3 (three) times daily with meals.    . hydrALAZINE (APRESOLINE) 100 MG tablet Take 100 mg by mouth 3 (three) times daily.    . isosorbide dinitrate (ISORDIL) 30 MG tablet Take 1 tablet (30 mg total) by mouth 2 (two) times daily.    [name] levETIRAcetam (KEPPRA) 500 MG tablet Take 1 tablet (500 mg total) by mouth 2 (two) times daily.    . metoCLOPramide (REGLAN) 5 MG tablet Take 5 mg by mouth 3 (three) times daily.    . metoprolol tartrate (LOPRESSOR) 25 MG tablet Take 6.25 mg by mouth 2 (two) times daily.     [name] omeprazole (PRILOSEC) 20 MG capsule Take 20 mg by mouth 2 (two) times daily before a meal.     . OXYGEN Inhale 2 L into the lungs continuous.    Vladimir Faster Glycol-Propyl Glycol (SYSTANE) 0.4-0.3 % SOLN Apply 1 drop to eye 4 (four) times daily.    . polyethylene glycol (MIRALAX / GLYCOLAX) packet Take 17 g by mouth 2 (two) times daily.    . traZODone (DESYREL) 50 MG tablet Take 50 mg by mouth at bedtime.     . Vitamin D, Ergocalciferol, (DRISDOL) 50000 units CAPS capsule Take 50,000 Units by mouth 3 (three) times a week.    [name] albuterol (PROVENTIL) (2.5 MG/3ML) 0.083% nebulizer solution Take 2.5 mg by nebulization every 6 (six) hours as needed for wheezing or shortness of breath.    .  cyanocobalamin (,VITAMIN B-12,) 1000 MCG/ML injection Inject 1,000 mcg into the muscle every 30 (thirty) days.    . feeding supplement (BOOST / RESOURCE BREEZE) LIQD Take 1 Container by mouth 2 (two) times daily between meals. Reported on 02/23/2016    . LORazepam (ATIVAN) 1 MG tablet Take 1 mg by mouth every 8 (eight) hours. 02/23/16 1 mg po/sl/IM every 15 min up to 6 mg for seizure episode     CURRENT MEDICATIONS: No current facility-administered medications on file prior to encounter.   Current Outpatient Prescriptions on File Prior to Encounter  Medication Sig Dispense Refill  . acetaminophen (TYLENOL) 500 MG tablet Take 500 mg by mouth every 6 (six) hours.    [name] allopurinol (ZYLOPRIM) 100 MG tablet Take 100 mg by mouth daily. Patient takes at 0800am daily    . amLODipine (NORVASC) 10 MG tablet Take 10 mg by mouth at bedtime.     [name] atorvastatin (LIPITOR) 20 MG tablet Take 20 mg by mouth at bedtime. Patient takes at 2100    . ferrous sulfate 325 (65 FE) MG tablet Take 325 mg by mouth 3 (three) times daily with meals.    . hydrALAZINE (APRESOLINE) 100 MG tablet Take 100 mg by mouth 3 (three) times daily.    . isosorbide dinitrate (ISORDIL) 30 MG tablet Take 1 tablet (30 mg total) by mouth 2 (two) times daily.    [name] levETIRAcetam (KEPPRA) 500 MG tablet Take  1 tablet (500 mg total) by mouth 2 (two) times daily.    . metoCLOPramide (REGLAN) 5 MG tablet Take 5 mg by mouth 3 (three) times daily.    . metoprolol tartrate (LOPRESSOR) 25 MG tablet Take 6.25 mg by mouth 2 (two) times daily.     [name] omeprazole (PRILOSEC) 20 MG capsule Take 20 mg by mouth 2 (two) times daily before a meal.     . OXYGEN Inhale 2 L into the lungs continuous.    Vladimir Faster Glycol-Propyl Glycol (SYSTANE) 0.4-0.3 % SOLN Apply 1 drop to eye 4 (four) times daily.    . polyethylene glycol (MIRALAX / GLYCOLAX) packet Take 17 g by mouth 2 (two) times daily.    . traZODone (DESYREL) 50 MG tablet Take 50 mg by mouth at bedtime.     .  Vitamin D, Ergocalciferol, (DRISDOL) 50000 units CAPS capsule Take 50,000 Units by mouth 3 (three) times a week.    [name] albuterol (PROVENTIL) (2.5 MG/3ML) 0.083% nebulizer solution Take 2.5 mg by nebulization every 6 (six) hours as needed for wheezing or shortness of breath.    . cyanocobalamin (,VITAMIN B-12,) 1000 MCG/ML injection Inject 1,000 mcg into the muscle every 30 (thirty) days.    . feeding supplement (BOOST / RESOURCE BREEZE) LIQD Take 1 Container by mouth 2 (two) times daily between meals. Reported on 02/23/2016    . LORazepam (ATIVAN) 1 MG tablet Take 1 mg by mouth every 8 (eight) hours. 02/23/16 1 mg po/sl/IM every 15 min up to 6 mg for seizure episode       Results for orders placed or performed during the hospital encounter of 06/22/16 (from the past 48 hour(s))  Comprehensive metabolic panel     Status: Abnormal   Collection Time: 06/22/16 12:40 PM  Result Value Ref Range   Sodium 142 135 - 145 mmol/L   Potassium 3.4 (L) 3.5 - 5.1 mmol/L   Chloride 113 (H) 101 - 111 mmol/L   CO2 20 (L) 22 - 32 mmol/L   Glucose, Bld 185 (H) 65 - 99 mg/dL   BUN 38 (H) 6 - 20 mg/dL   Creatinine, Ser 1.63 (H) 0.44 - 1.00 mg/dL   Calcium 8.5 (L) 8.9 - 10.3 mg/dL   Total Protein 6.3 (L) 6.5 - 8.1 g/dL   Albumin 2.8 (L) 3.5 - 5.0 g/dL   AST 23 15 - 41 U/L   ALT 9 (L) 14 - 54 U/L   Alkaline Phosphatase 106 38 - 126 U/L   Total Bilirubin 0.7 0.3 - 1.2 mg/dL   GFR calc non Af Amer 32 (L) >60 mL/min   GFR calc Af Amer 37 (L) >60 mL/min    Comment: (NOTE) The eGFR has been calculated using the CKD EPI equation. This calculation has not been validated in all clinical situations. eGFR's persistently <60 mL/min signify possible Chronic Kidney Disease.    Anion gap 9 5 - 15  CBC WITH DIFFERENTIAL     Status: Abnormal   Collection Time: 06/22/16 12:40 PM  Result Value Ref Range   WBC 12.1 (H) 4.0 - 10.5 K/uL   RBC 3.54 (L) 3.87 - 5.11 MIL/uL   Hemoglobin 10.2 (L) 12.0 - 15.0 g/dL   HCT 32.9 (L)  36.0 - 46.0 %   MCV 92.9 78.0 - 100.0 fL   MCH 28.8 26.0 - 34.0 pg   MCHC 31.0 30.0 - 36.0 g/dL   RDW 14.5 11.5 - 15.5 %   Platelets 231 150 -  400 K/uL   Neutrophils Relative % 80 %   Neutro Abs 9.6 (H) 1.7 - 7.7 K/uL   Lymphocytes Relative 16 %   Lymphs Abs 2.0 0.7 - 4.0 K/uL   Monocytes Relative 3 %   Monocytes Absolute 0.4 0.1 - 1.0 K/uL   Eosinophils Relative 1 %   Eosinophils Absolute 0.2 0.0 - 0.7 K/uL   Basophils Relative 0 %   Basophils Absolute 0.0 0.0 - 0.1 K/uL  Protime-INR     Status: None   Collection Time: 06/22/16 12:40 PM  Result Value Ref Range   Prothrombin Time 12.8 11.6 - 15.2 seconds   INR 0.94 0.00 - 1.49  I-Stat Chem 8,ED  (not at [facility])     Status: Abnormal   Collection Time: 06/22/16 12:55 PM  Result Value Ref Range   Sodium 145 135 - 145 mmol/L   Potassium 3.4 (L) 3.5 - 5.1 mmol/L   Chloride 111 101 - 111 mmol/L   BUN 45 (H) 6 - 20 mg/dL   Creatinine, Ser 1.70 (H) 0.44 - 1.00 mg/dL   Glucose, Bld 185 (H) 65 - 99 mg/dL   Calcium, Ion 1.15 1.12 - 1.23 mmol/L   TCO2 24 0 - 100 mmol/L   Hemoglobin 10.5 (L) 12.0 - 15.0 g/dL   HCT 31.0 (L) 36.0 - 46.0 %  I-Stat CG4 Lactic Acid, ED  (not at  [facility])     Status: None   Collection Time: 06/22/16 12:55 PM  Result Value Ref Range   Lactic Acid, Venous 1.44 0.5 - 1.9 mmol/L  Urinalysis, Routine w reflex microscopic (not at [facility])     Status: Abnormal   Collection Time: 06/22/16  3:52 PM  Result Value Ref Range   Color, Urine YELLOW YELLOW   APPearance CLEAR CLEAR   Specific Gravity, Urine 1.018 1.005 - 1.030   pH 6.0 5.0 - 8.0   Glucose, UA 100 (A) NEGATIVE mg/dL   Hgb urine dipstick TRACE (A) NEGATIVE   Bilirubin Urine NEGATIVE NEGATIVE   Ketones, ur 15 (A) NEGATIVE mg/dL   Protein, ur >300 (A) NEGATIVE mg/dL   Nitrite NEGATIVE NEGATIVE   Leukocytes, UA NEGATIVE NEGATIVE  Urine microscopic-add on     Status: Abnormal   Collection Time: 06/22/16  3:52 PM  Result Value Ref Range   Squamous  Epithelial / LPF 6-30 (A) NONE SEEN   WBC, UA 0-5 0 - 5 WBC/hpf   RBC / HPF 0-5 0 - 5 RBC/hpf   Bacteria, UA FEW (A) NONE SEEN  Heparin level (unfractionated)     Status: Abnormal   Collection Time: 06/22/16 11:23 PM  Result Value Ref Range   Heparin Unfractionated 1.10 (H) 0.30 - 0.70 IU/mL    Comment:        IF HEPARIN RESULTS ARE BELOW EXPECTED VALUES, AND PATIENT DOSAGE HAS BEEN CONFIRMED, SUGGEST FOLLOW UP TESTING OF ANTITHROMBIN III LEVELS.   MRSA PCR Screening     Status: None   Collection Time: 06/23/16  1:03 AM  Result Value Ref Range   MRSA by PCR NEGATIVE NEGATIVE    Comment:        The GeneXpert MRSA Assay (FDA approved for NASAL specimens only), is one component of a comprehensive MRSA colonization surveillance program. It is not intended to diagnose MRSA infection nor to guide or monitor treatment for MRSA infections.   CBC     Status: Abnormal   Collection Time: 06/23/16  3:00 AM  Result Value Ref Range  WBC 17.9 (H) 4.0 - 10.5 K/uL   RBC 3.64 (L) 3.87 - 5.11 MIL/uL   Hemoglobin 10.4 (L) 12.0 - 15.0 g/dL   HCT 33.3 (L) 36.0 - 46.0 %   MCV 91.5 78.0 - 100.0 fL   MCH 28.6 26.0 - 34.0 pg   MCHC 31.2 30.0 - 36.0 g/dL   RDW 15.0 11.5 - 15.5 %   Platelets 207 150 - 400 K/uL  Comprehensive metabolic panel     Status: Abnormal   Collection Time: 06/23/16  3:00 AM  Result Value Ref Range   Sodium 143 135 - 145 mmol/L   Potassium 4.2 3.5 - 5.1 mmol/L    Comment: DELTA CHECK NOTED   Chloride 114 (H) 101 - 111 mmol/L   CO2 18 (L) 22 - 32 mmol/L   Glucose, Bld 88 65 - 99 mg/dL   BUN 36 (H) 6 - 20 mg/dL   Creatinine, Ser 1.72 (H) 0.44 - 1.00 mg/dL   Calcium 8.3 (L) 8.9 - 10.3 mg/dL   Total Protein 5.7 (L) 6.5 - 8.1 g/dL   Albumin 2.3 (L) 3.5 - 5.0 g/dL   AST 20 15 - 41 U/L   ALT 9 (L) 14 - 54 U/L   Alkaline Phosphatase 94 38 - 126 U/L   Total Bilirubin 0.6 0.3 - 1.2 mg/dL   GFR calc non Af Amer 30 (L) >60 mL/min   GFR calc Af Amer 35 (L) >60 mL/min     Comment: (NOTE) The eGFR has been calculated using the CKD EPI equation. This calculation has not been validated in all clinical situations. eGFR's persistently <60 mL/min signify possible Chronic Kidney Disease.    Anion gap 11 5 - 15  Magnesium     Status: None   Collection Time: 06/23/16  3:00 AM  Result Value Ref Range   Magnesium 1.9 1.7 - 2.4 mg/dL  Phosphorus     Status: None   Collection Time: 06/23/16  3:00 AM  Result Value Ref Range   Phosphorus 3.6 2.5 - 4.6 mg/dL  Heparin level (unfractionated)     Status: None   Collection Time: 06/23/16  8:14 AM  Result Value Ref Range   Heparin Unfractionated 0.70 0.30 - 0.70 IU/mL    Comment:        IF HEPARIN RESULTS ARE BELOW EXPECTED VALUES, AND PATIENT DOSAGE HAS BEEN CONFIRMED, SUGGEST FOLLOW UP TESTING OF ANTITHROMBIN III LEVELS.    Ct Head Wo Contrast  06/22/2016  CLINICAL DATA:  Unresponsive at nursing home.  Seizures. EXAM: CT HEAD WITHOUT CONTRAST TECHNIQUE: Contiguous axial images were obtained from the base of the skull through the vertex without intravenous contrast. 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 There is no evidence for acute hemorrhage, hydrocephalus, mass lesion, or abnormal extra-axial fluid collection. No definite CT evidence for acute infarction. Diffuse loss of parenchymal volume is consistent with atrophy. Patchy low attenuation in the deep hemispheric and periventricular white matter is nonspecific, but likely reflects chronic microvascular ischemic demyelination. The visualized paranasal sinuses and mastoid air cells are clear. IMPRESSION: Stable.  Atrophy with chronic small vessel white matter disease. 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 Electronically Signed   By: Misty Stanley M.D.   On: 06/22/2016 13:48   Dg Chest Port 1 View  06/23/2016  CLINICAL DATA:  Sob per chart, pt unable to give any history EXAM: PORTABLE CHEST 1 VIEW  COMPARISON:  06/22/2016 FINDINGS: Heart is enlarged. There  are persistent bilateral pulmonary infiltrates, probably stable in appearance accounting for differences in technique. Right pleural effusion or pleural thickening again noted. Bilateral costophrenic angle blunting again noted. IMPRESSION: 1. Cardiomegaly. 2. Persistent bilateral pleural changes and parenchymal opacities. Electronically Signed   By: Nolon Nations M.D.   On: 06/23/2016 10:47   Dg Chest Port 1 View  06/22/2016  CLINICAL DATA:  Grand mal seizure.  Possible aspiration. EXAM: PORTABLE CHEST 1 VIEW COMPARISON:  01/03/2016 FINDINGS: There is moderate cardiac enlargement. Aortic atherosclerosis noted. Asymmetric opacification within the right midlung and right upper lobe is identify. Mild diffuse pulmonary vascular congestion noted. IMPRESSION: 1. Cardiac enlargement and aortic atherosclerosis. 2. Mild asymmetric increased opacification within the right upper lobe and right midlung which may reflect underlying aspiration, pneumonia or asymmetric edema. Electronically Signed   By: Kerby Moors M.D.   On: 06/22/2016 14:28    ROS: General:no colds or fevers, + weight loss from 63 lbs since Jan/2017 Information provided by husband. Pt not responding.  Skin:no rashes or ulcers HEENT:no blurred vision, no congestion CV:see HPI PUL:see HPI GI:no diarrhea constipation or melena, no indigestion GU:no hematuria, no dysuria MS:no joint pain, no claudication- been in bed for 3 years.  He helps her with meals she can feed her self but slow and with trembling.  Neuro:no syncope, no lightheadedness Endo:+ diabetes, no thyroid disease   Blood pressure 160/90, pulse 102, temperature 100.7 F (38.2 C), temperature source Axillary, resp. rate 28, height '5\' 3"'$  (1.6 m), weight 163 lb 12.8 oz (74.3 kg), SpO2 99 %.  Wt Readings from Last 3 Encounters:  06/22/16 163 lb 12.8 oz (74.3 kg)  01/04/16 223 lb 1.7 oz (101.2 kg)  04/27/15 223 lb  (101.152 kg)    PE: General:post ictal- eyes open not following commands, NAD Skin:Warm and dry, brisk capillary refill HEENT:normocephalic, sclera clear, mucus membranes moist Neck:supple, no JVD, no bruits  Heart:S1S2 RRR without murmur, gallup, rub or click Lungs:clear, ant .  without rales, rhonchi, or wheezes EXH:BZJI, non tender, + BS, do not palpate liver spleen or masses Ext:no to trace.  lower ext edema, 2+ pedal pulses, 2+ radial pulses Neuro: not responding though eyes are now open. Moves legs spontaneously- but not following commands   Assessment/Plan Principal Problem:   Status epilepticus ([place])  - now opening eyes she is able to move legs spontaneous- not to command.   Active Problems:   PAF (paroxysmal atrial fibrillation) ([place])- She may be going in and out.  In the past she was not on anticoagulation despite high CHA2DS2VASc score due to GI bleed and severe hematoma.  Now on heparin, with acute issues continue anticoagulation but monitor for bleeding.   Chronic combined systolic and diastolic CHF (congestive heart failure) ([place])- cardiomegaly on CXR.    SOB (shortness of breath) now may be from neuro   Total self-care deficit   AKI (acute kidney injury) ([place])   CKD (chronic kidney disease) stage 3, GFR 30-59 ml/min   Malignant HTN with heart disease, w/o CHF, with chronic kidney disease   SVT (supraventricular tachycardia) ([place])- currently in SR in 90s. IV metoprolol has been added which may prevent.  Dr. Meda Coffee to see.   Cecilie Kicks  Nurse Practitioner Certified [place] Pager [phone] or after 5pm or weekends call [phone] 06/23/2016, 11:10 AM    The patient was seen, examined and discussed with Cecilie Kicks, NP and I agree with the above.   65 year old female with prior medical  history of ischemic strokes, bedridden for the last 3 years, residing in a nursing home , with prior medical history of paroxysmal atrial fibrillation not on  anticoagulation secondary to severe GI bleed and severe hematoma approximately 3 years ago, history of chronic combined systolic and diastolic heart failure, malignant hypertension who presented yesterday after she developed acute mental status changes while in nursing home and later on in an ambulance developed seizures. She was unresponsive when she presented to [facility]. She was started on therapy for seizures , EEG showed generalized status epilepticus. While in the hospital she was noted to have transient episodes of atrial fibrillation with controlled ventricular rate and episodes of SVTs with heart rate up to 205. We were consulted for further management.  I have personally reviewed her telemetry there are very frequent PACs, episodes of atrial fibrillation, as well as SVT with rapid ventricular rate of 205 BPM. This patient clearly has history of atrial fibrillation and was not on anticoagulation, currently on IV heparin with no obvious bleed since yesterday, chronic anticoagulation should be reconsidered to prevent future strokes.  Regarding arrhythmia suppression, it is possible that her ectopy and arrhythmias are being driven by acute ischemic stroke complicated by status epilepticus. At this point she is not a good candidate for an ablation, I would start metoprolol IV 5 mg every 5 hours and uptitrate if necessary. If this is not controlling her runs of SVT and atrial fibrillation we would switch to Cardizem IV and potentially to amiodarone as a last resort.  If she develops acute SVTs with hypotension and extra metoprolol IV 5 mg 1 or a Olena Heckle and IV push can be used.  Her echocardiogram in November 2014 showed normal LVEF 50-55% with no regional wall motion abnormalities, grade 1 diastolic dysfunction and mildly dilated left atrium. We will repeat.  Ena Dawley 06/23/2016

## 2016-06-23 NOTE — Progress Notes (Signed)
ANTICOAGULATION CONSULT NOTE - Follow-up Consult  Pharmacy Consult for heparin Indication: r/o PE  Allergies  Allergen Reactions  . Daptomycin Other (See Comments)    Elevated CPK  . Morphine And Related Nausea And Vomiting  . Cephalexin Rash  . Tramadol Nausea And Vomiting  . Atenolol     unknown  . Cephalosporins Other (See Comments)    Unknown  . Dapsone Other (See Comments)    unknown  . Erythromycin     unknown  . Erythromycin Base Other (See Comments)    Unknown  . Morphine Nausea And Vomiting  . Celecoxib Other (See Comments)    Unknown, can use furosemide without issue  . Cephalexin Rash  . Codeine Other (See Comments)    unknown  . Fluoxetine Hcl Other (See Comments)    unknown  . Latex Other (See Comments)  . Ofloxacin Other (See Comments)    unknown  . Penicillins Other (See Comments)    Unknown  Has patient had a PCN reaction causing immediate rash, facial/tongue/throat swelling, SOB or lightheadedness with hypotension: unknown Has patient had a PCN reaction causing severe rash involving mucus membranes or skin necrosis: unknown Has patient had a PCN reaction that required hospitalization unknown Has patient had a PCN reaction occurring within the last 10 years: unknown If all of the above answers are "NO", then may proceed with Cephalosporin use.  [name] Rofecoxib Other (See Comments)    unknown  . Sulfonamide Derivatives Other (See Comments)    unknown    Patient Measurements: Height: 5\' 3"  (160 cm) Weight: 163 lb 12.8 oz (74.3 kg) IBW/kg (Calculated) : 52.4 Heparin Dosing Weight: 76kg  Vital Signs: Temp: 100.7 F (38.2 C) (07/16 0725) Temp Source: Axillary (07/16 0725) BP: 160/82 mmHg (07/16 0725) Pulse Rate: 96 (07/16 0725)  Labs:  Recent Labs  06/22/16 1240 06/22/16 1255 06/22/16 2323 06/23/16 0300 06/23/16 0814  HGB 10.2* 10.5*  --  10.4*  --   HCT 32.9* 31.0*  --  33.3*  --   PLT 231  --   --  207  --   LABPROT 12.8  --   --   --    --   INR 0.94  --   --   --   --   HEPARINUNFRC  --   --  1.10*  --  0.70  CREATININE 1.63* 1.70*  --  1.72*  --     Estimated Creatinine Clearance: 31.1 mL/min (by C-G formula based on Cr of 1.72).   Assessment: 65 yo female on heparin IV for r/o PE. VQ scan is ordered. Heparin level high end of  therapeutic (0.7) on 1200 units/hr. Heparin level was 1.1 last night on same rate following bolus and infusion. Spoke with RN today, who endorsed that infusion has been running with no issues noted.  H/H low stable. Platelets wnl.   Will slightly reduce dose, continue to monitor, and adjust based on heparin levels.   Goal of Therapy:  Heparin level 0.3-0.7 units/ml Monitor platelets by anticoagulation protocol: Yes   Plan:  - Reduce heparin infusion to 1100 units/hr IV  - F/u with 8-hr heparin level - F/u VQ results - Monitor daily heparin, CBC, and s/sx of bleeding.   Demetrius Charity, PharmD Acute Care Pharmacy Resident  Pager: [phone] 06/23/2016

## 2016-06-23 NOTE — ED Provider Notes (Signed)
CSN: [id number]     Arrival date & time 06/22/16  1231 History   First MD Initiated Contact with Patient 06/22/16 1238     Chief Complaint  Patient presents with  . Seizures     (Consider location/radiation/quality/duration/timing/severity/associated sxs/prior Treatment) HPI Comments: LEVEL 5 CAVEAT patient is altered. Pt brought to the ER with cc of seizures. PT has hx of seizures. She has extensive medical hx and resides at nursing home. Per report, pt was noted to have L sided gaze and the EMS also saw a grand mal seizure. Pt was given versed. The shaking stopped, but the upward gaze remain. Husband reported that his wife appeared a bit "out of it" today. Usually aox3. No recent seizures. He is not aware of any fevers.   Patient is a 65 y.o. female presenting with seizures. The history is provided by the nursing home.  Seizures   Past Medical History  Diagnosis Date  . Asthma   . Bronchitis   . Allergic rhinitis   . HTN (hypertension)   . Hyperlipidemia   . Obesity   . OSA (obstructive sleep apnea)     poor compliance with cpap  . CHF (congestive heart failure) ([place])   . Biventricular failure ([place])     compensated  . Psoriasis   . Stasis edema     of lower extremities  . Depression   . Situational stress   . Anemia   . Gout   . Chronic anticoagulation     off due to GIB, thigh hematoma  . Yeast infection involving the vagina and surrounding area   . Atrial fibrillation with RVR ([place])   . GERD (gastroesophageal reflux disease)   . Chronic bronchitis ([place])     "get it q yr" (08/10/2013)  . Shortness of breath     "all the time" (08/10/2013)  . On home oxygen therapy     "2L during the night and prn during the day" (08/10/2013)  . Type II diabetes mellitus ([place])   . History of blood transfusion     "few during my lifetime; last time was 4 days ago when I got 2 units" (08/10/2013)  . Stroke [facility]) ?2007    denies residuals on 08/10/2013  . DJD (degenerative joint disease)    . Bleeding     "from my skin folds; just won't stop" (08/10/2013)  . Complication of anesthesia     "they gave me too much; I stayed out of it for awhile" (08/10/2013)  . Anxiety   . Kidney stones   . Kidney disease, chronic, stage III (GFR 30-59 ml/min)   . Chronic kidney disease, stage IV (severe) ([place])   . Morbid obesity ([place])   . Diastolic CHF ([place])   . Pneumonia     "said I did on 08/03/2013 then they ruled it out" (08/10/2013)  . HCAP (healthcare-associated pneumonia) 01/03/2016  . Seizures [facility])    Past Surgical History  Procedure Laterality Date  . Appendectomy    . Cholecystectomy    . Total knee arthroplasty Left ~ 2006  . Total abdominal hysterectomy    . Back surgery    . Cystectomy Left     hand  . Tonsillectomy    . Joint replacement    . Lumbar disc surgery    . Colonoscopy N/A 08/16/2013    Procedure: COLONOSCOPY;  Surgeon: Beryle Beams, MD;  Location: [facility];  Service: Endoscopy;  Laterality: N/A;  . Hematoma evacuation Right 09/10/2013  Procedure: EVACUATION OF RIGHT LEG HEMATOMA AND EXCISION [place] RIGHT LEG ;  Surgeon: Merrie Roof, MD;  Location: [facility];  Service: General;  Laterality: Right;  . Debridement and closure wound Right 09/22/2013    Procedure: IRRIGATION AND DEBRIDEMENT SURGICAL PREP PLACEMENT OF [place]  ;  Surgeon: Irene Limbo, MD;  Location: [facility];  Service: Plastics;  Laterality: Right;  . Skin split graft Right 10/07/2013    Procedure: SPLIT THICKNESS SKIN GRAFT RIGHT THIGH TO RIGHT LEG, PLACEMENT OF A CEL AND WOUND VAC TO RIGHT THIGH  ;  Surgeon: Irene Limbo, MD;  Location: [facility];  Service: Plastics;  Laterality: Right;   Family History  Problem Relation Age of Onset  . Heart attack Father   . Asthma Father   . Heart disease Paternal Uncle   . Rectal cancer Paternal Aunt   . Other Mother     mva   Social History  Substance Use Topics  . Smoking status: Former Smoker -- 0.12 packs/day for 12 years     Types: Cigarettes    Quit date: 07/05/1982  . Smokeless tobacco: Former Systems developer     Comment: smoked for 1.5 yrs about 2 cigs a day ,only if stressed  . Alcohol Use: No   OB History    No data available     Review of Systems  Unable to perform ROS: Mental status change  Neurological: Positive for seizures.      Allergies  Daptomycin; Morphine and related; Cephalexin; Tramadol; Atenolol; Cephalosporins; Dapsone; Erythromycin; Erythromycin base; Morphine; Celecoxib; Cephalexin; Codeine; Fluoxetine hcl; Latex; Ofloxacin; Penicillins; Rofecoxib; and Sulfonamide derivatives  Home Medications   Prior to Admission medications   Medication Sig Start Date End Date Taking? Authorizing Provider  acetaminophen (TYLENOL) 500 MG tablet Take 500 mg by mouth every 6 (six) hours.   Yes Historical Provider, MD  allopurinol (ZYLOPRIM) 100 MG tablet Take 100 mg by mouth daily. Patient takes at 0800am daily 09/16/11  Yes Historical Provider, MD  Amino Acids-Protein Hydrolys (FEEDING SUPPLEMENT, PRO-STAT SUGAR FREE 64,) LIQD Take 30 mLs by mouth 2 (two) times daily.   Yes Historical Provider, MD  amLODipine (NORVASC) 10 MG tablet Take 10 mg by mouth at bedtime.  08/19/13  Yes Thurnell Lose, MD  atorvastatin (LIPITOR) 20 MG tablet Take 20 mg by mouth at bedtime. Patient takes at 2100   Yes Historical Provider, MD  ferrous sulfate 325 (65 FE) MG tablet Take 325 mg by mouth 3 (three) times daily with meals.   Yes Historical Provider, MD  hydrALAZINE (APRESOLINE) 100 MG tablet Take 100 mg by mouth 3 (three) times daily.   Yes Historical Provider, MD  hydrALAZINE (APRESOLINE) 25 MG tablet Take 25 mg by mouth 3 (three) times daily.   Yes Historical Provider, MD  isosorbide dinitrate (ISORDIL) 30 MG tablet Take 1 tablet (30 mg total) by mouth 2 (two) times daily. 01/28/14  Yes Charlynne Cousins, MD  levETIRAcetam (KEPPRA) 500 MG tablet Take 1 tablet (500 mg total) by mouth 2 (two) times daily. 01/06/16  Yes  Thurnell Lose, MD  metoCLOPramide (REGLAN) 5 MG tablet Take 5 mg by mouth 3 (three) times daily.   Yes Historical Provider, MD  metoprolol tartrate (LOPRESSOR) 25 MG tablet Take 6.25 mg by mouth 2 (two) times daily.    Yes Historical Provider, MD  omeprazole (PRILOSEC) 20 MG capsule Take 20 mg by mouth 2 (two) times daily before a meal.    Yes  Historical Provider, MD  OXYGEN Inhale 2 L into the lungs continuous.   Yes Historical Provider, MD  Polyethyl Glycol-Propyl Glycol (SYSTANE) 0.4-0.3 % SOLN Apply 1 drop to eye 4 (four) times daily.   Yes Historical Provider, MD  polyethylene glycol (MIRALAX / GLYCOLAX) packet Take 17 g by mouth 2 (two) times daily.   Yes Historical Provider, MD  traZODone (DESYREL) 50 MG tablet Take 50 mg by mouth at bedtime.    Yes Historical Provider, MD  Vitamin D, Ergocalciferol, (DRISDOL) 50000 units CAPS capsule Take 50,000 Units by mouth 3 (three) times a week.   Yes Historical Provider, MD  albuterol (PROVENTIL) (2.5 MG/3ML) 0.083% nebulizer solution Take 2.5 mg by nebulization every 6 (six) hours as needed for wheezing or shortness of breath.    Historical Provider, MD  cyanocobalamin (,VITAMIN B-12,) 1000 MCG/ML injection Inject 1,000 mcg into the muscle every 30 (thirty) days.    Historical Provider, MD  feeding supplement (BOOST / RESOURCE BREEZE) LIQD Take 1 Container by mouth 2 (two) times daily between meals. Reported on 02/23/2016    Historical Provider, MD  LORazepam (ATIVAN) 1 MG tablet Take 1 mg by mouth every 8 (eight) hours. 02/23/16 1 mg po/sl/IM every 15 min up to 6 mg for seizure episode    Historical Provider, MD   BP 160/82 mmHg  Pulse 96  Temp(Src) 100.7 F (38.2 C) (Axillary)  Resp 36  Ht 5\' 3"  (1.6 m)  Wt 163 lb 12.8 oz (74.3 kg)  BMI 29.02 kg/m2  SpO2 98% Physical Exam  Constitutional: She appears well-developed.  HENT:  Head: Normocephalic and atraumatic.  Eyes: EOM are normal.  Neck: Normal range of motion. Neck supple.   Cardiovascular: Normal rate.   Pulmonary/Chest: Effort normal.  Abdominal: Bowel sounds are normal.  Neurological:  L sided gaze  Skin: Skin is warm and dry.  Nursing note and vitals reviewed.   ED Course  Procedures (including critical care time) Labs Review Labs Reviewed  COMPREHENSIVE METABOLIC PANEL - Abnormal; Notable for the following:    Potassium 3.4 (*)    Chloride 113 (*)    CO2 20 (*)    Glucose, Bld 185 (*)    BUN 38 (*)    Creatinine, Ser 1.63 (*)    Calcium 8.5 (*)    Total Protein 6.3 (*)    Albumin 2.8 (*)    ALT 9 (*)    GFR calc non Af Amer 32 (*)    GFR calc Af Amer 37 (*)    All other components within normal limits  CBC WITH DIFFERENTIAL/PLATELET - Abnormal; Notable for the following:    WBC 12.1 (*)    RBC 3.54 (*)    Hemoglobin 10.2 (*)    HCT 32.9 (*)    Neutro Abs 9.6 (*)    All other components within normal limits  URINALYSIS, ROUTINE W REFLEX MICROSCOPIC (NOT AT [facility]) - Abnormal; Notable for the following:    Glucose, UA 100 (*)    Hgb urine dipstick TRACE (*)    Ketones, ur 15 (*)    Protein, ur >300 (*)    All other components within normal limits  HEPARIN LEVEL (UNFRACTIONATED) - Abnormal; Notable for the following:    Heparin Unfractionated 1.10 (*)    All other components within normal limits  URINE MICROSCOPIC-ADD ON - Abnormal; Notable for the following:    Squamous Epithelial / LPF 6-30 (*)    Bacteria, UA FEW (*)    All  other components within normal limits  CBC - Abnormal; Notable for the following:    WBC 17.9 (*)    RBC 3.64 (*)    Hemoglobin 10.4 (*)    HCT 33.3 (*)    All other components within normal limits  COMPREHENSIVE METABOLIC PANEL - Abnormal; Notable for the following:    Chloride 114 (*)    CO2 18 (*)    BUN 36 (*)    Creatinine, Ser 1.72 (*)    Calcium 8.3 (*)    Total Protein 5.7 (*)    Albumin 2.3 (*)    ALT 9 (*)    GFR calc non Af Amer 30 (*)    GFR calc Af Amer 35 (*)    All other components  within normal limits  I-STAT CHEM 8, ED - Abnormal; Notable for the following:    Potassium 3.4 (*)    BUN 45 (*)    Creatinine, Ser 1.70 (*)    Glucose, Bld 185 (*)    Hemoglobin 10.5 (*)    HCT 31.0 (*)    All other components within normal limits  MRSA PCR SCREENING  CULTURE, BLOOD (ROUTINE X 2)  CULTURE, BLOOD (ROUTINE X 2)  URINE CULTURE  CULTURE, EXPECTORATED SPUTUM-ASSESSMENT  PROTIME-INR  MAGNESIUM  PHOSPHORUS  HEPARIN LEVEL (UNFRACTIONATED)  I-STAT CG4 LACTIC ACID, ED  I-STAT CG4 LACTIC ACID, ED    Imaging Review Ct Head Wo Contrast  06/22/2016  CLINICAL DATA:  Unresponsive at nursing home.  Seizures. EXAM: CT HEAD WITHOUT CONTRAST TECHNIQUE: Contiguous axial images were obtained from the base of the skull through the vertex without intravenous contrast. 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 There is no evidence for acute hemorrhage, hydrocephalus, mass lesion, or abnormal extra-axial fluid collection. No definite CT evidence for acute infarction. Diffuse loss of parenchymal volume is consistent with atrophy. Patchy low attenuation in the deep hemispheric and periventricular white matter is nonspecific, but likely reflects chronic microvascular ischemic demyelination. The visualized paranasal sinuses and mastoid air cells are clear. IMPRESSION: Stable.  Atrophy with chronic small vessel white matter disease. 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 Electronically Signed   By: Misty Stanley M.D.   On: 06/22/2016 13:48   Dg Chest Port 1 View  06/22/2016  CLINICAL DATA:  Grand mal seizure.  Possible aspiration. EXAM: PORTABLE CHEST 1 VIEW COMPARISON:  01/03/2016 FINDINGS: There is moderate cardiac enlargement. Aortic atherosclerosis noted. Asymmetric opacification within the right midlung and right upper lobe is identify. Mild diffuse pulmonary vascular congestion noted. IMPRESSION: 1. Cardiac enlargement and  aortic atherosclerosis. 2. Mild asymmetric increased opacification within the right upper lobe and right midlung which may reflect underlying aspiration, pneumonia or asymmetric edema. Electronically Signed   By: Kerby Moors M.D.   On: 06/22/2016 14:28   I have personally reviewed and evaluated these images and lab results as part of my medical decision-making.   EKG Interpretation   Date/Time:  Saturday June 22 2016 12:38:46 EDT Ventricular Rate:  121 PR Interval:    QRS Duration: 83 QT Interval:  332 QTC Calculation: 471 R Axis:   59 Text Interpretation:  Sinus tachycardia with irregular rate Borderline  repolarization abnormality No acute changes No significant change since  last tracing Nonspecific ST and T wave abnormality Confirmed by Kathrynn Humble,  MD, Jonatha Gagen [phone]) on 06/22/2016 12:56:16 PM      MDM   Final diagnoses:  Status epilepticus ([place])    Pt  comes in with what appears to be status epilepticus. She has had an upward gaze for a while now with, 2 grand mal seizure. No recent infection. On keppra.  DDx: -Seizure disorder -Meningitis -Trauma -ICH -Electrolyte abnormality -Metabolic derangement -Stroke -Toxin induced seizures -Medication side effects -Hypoxia -Hypoglycemia   Seizure could also be due to infection - so we will get infection workup. Neuro consulted.  LATE ENTRY: Neuro cleared the patient. Seizures stopped. Gaze is normal. Pt is speaking to [place]. She complains of some dyspnea, and is noted to be breathing 26 times. She has clear lungs. CXR at arrival was normal. Pt was vomiting at arrival and we had to suction her - and so she might have aspirated. She received a liter of fluid here. We will admit to medicine for dib and seizures. Keppra load given. Admitting team wants to evaluate for PE - so vq scan ordered. While at VQ scan, pt seized again - so the procedure was aborted.  CRITICAL CARE Performed by: Varney Biles   Total critical care  time: 75 minutes  Critical care time was exclusive of separately billable procedures and treating other patients.  Critical care was necessary to treat or prevent imminent or life-threatening deterioration.  Critical care was time spent personally by me on the following activities: development of treatment plan with patient and/or surrogate as well as nursing, discussions with consultants, evaluation of patient's response to treatment, examination of patient, obtaining history from patient or surrogate, ordering and performing treatments and interventions, ordering and review of laboratory studies, ordering and review of radiographic studies, pulse oximetry and re-evaluation of patient's condition.     Varney Biles, MD 06/23/16 [phone]

## 2016-06-23 NOTE — Procedures (Signed)
Electroencephalogram report- LTM  Ordering Physician : Dr. Nicole Kindred EEG number: [phone]    Beginning date and time: 06/22/2016 11:08PM Ending date and time:  06/23/2016 11:52AM  Day of study: 1  Medications include: Keppra, Vimpat  MENTAL STATUS (per technician's notes):Obtunded.  HISTORY: This 24 hours of intensive EEG monitoring with simultaneous video monitoring was performed for this patient with unresponsiveness and seizures.  This EEG was requested to rule out subclinical electrographic seizures.  TECHNICAL DESCRIPTION:  The study consists of a continuous 16-channel multi-montage digital video EEG recording with twenty-one electrodes placed according to the International 10-20 System. Additional leads included eye leads, true temporal leads (T1, T2), and an EKG lead. Activation procedures were not done due to mental status.  REPORT: At the beginning of the recording, 2.5-3.5Hz  generalized spike wave discharges were seen. These improve, with the appearance of spike wave discharges in the left temporal region, and delta, theta activity in the right hemisphere. However, during the later part of the recording, about 1Hz  periodic epilepitform discharges were seen in the right temporal region, while the discharges improve on the left.  There were no pushbutton activations events during this recording.   INTERPRETATION: This is an abnormal EEG due to: 1) Generalized status epilepticus at the beginning of the recording, followed by alternating epileptiform discharges in the left and right hemispheric region.  Clinical Correlation: This EEG showed generalized status epilepticus that resolves, followed by cortical irritability, alternating in the bilateral hemispheres.

## 2016-06-23 NOTE — Progress Notes (Signed)
CCMD called @0538  pt was in SVT with a HR in 200's and sustaining, Nurse went into pt room and took pt vital signs and BP elevated and labored breathing. Rapid response called, L. [place] paged and [place] notified of pt condition. Adenisone ordered and given to pt via Rapid response. HR dropped to 40's and back to the 120's. Dr Clotilde Dieter at bedside. Metoprolol also given to pt and scheduled Q6. EKG obtained. Pt current HR in the 80's. Continue to monitor.

## 2016-06-23 NOTE — Progress Notes (Signed)
Applied InterDry to pt's folds as prescribed by [employer]. Pt is more alert and watching TV. Pt starting to answer yes and no questions appropriately. Consuelo Pandy RN

## 2016-06-23 NOTE — Progress Notes (Signed)
Rapid response: Patient. Heart rate up to 190. Has received 6 of adenosine from evening. Blood pressure 173/93. 5 of IV metoprolol given. Heart rate now 91. Blood pressure 162/93. Start patient on schedule Lopressor 5 mg every 6 hours with hold parameters. Patient also with eye deviation to the right possible seizures activity. 2 mg of IV Ativan given. Patient not talking but more responsive.

## 2016-06-24 ENCOUNTER — Inpatient Hospital Stay (HOSPITAL_COMMUNITY)

## 2016-06-24 DIAGNOSIS — S8012XA Contusion of left lower leg, initial encounter: Secondary | ICD-10-CM | POA: Clinically undetermined

## 2016-06-24 DIAGNOSIS — R9431 Abnormal electrocardiogram [ECG] [EKG]: Secondary | ICD-10-CM

## 2016-06-24 LAB — BLOOD CULTURE ID PANEL (REFLEXED)
Acinetobacter baumannii: NOT DETECTED
CANDIDA PARAPSILOSIS: NOT DETECTED
CARBAPENEM RESISTANCE: NOT DETECTED
Candida albicans: NOT DETECTED
Candida glabrata: NOT DETECTED
Candida krusei: NOT DETECTED
Candida tropicalis: NOT DETECTED
ENTEROBACTER CLOACAE COMPLEX: NOT DETECTED
ENTEROBACTERIACEAE SPECIES: NOT DETECTED
ESCHERICHIA COLI: NOT DETECTED
Enterococcus species: NOT DETECTED
Haemophilus influenzae: NOT DETECTED
KLEBSIELLA PNEUMONIAE: NOT DETECTED
Klebsiella oxytoca: NOT DETECTED
Listeria monocytogenes: NOT DETECTED
Methicillin resistance: NOT DETECTED
Neisseria meningitidis: NOT DETECTED
PROTEUS SPECIES: NOT DETECTED
PSEUDOMONAS AERUGINOSA: NOT DETECTED
STAPHYLOCOCCUS AUREUS BCID: NOT DETECTED
STAPHYLOCOCCUS SPECIES: NOT DETECTED
STREPTOCOCCUS AGALACTIAE: NOT DETECTED
STREPTOCOCCUS PYOGENES: NOT DETECTED
Serratia marcescens: NOT DETECTED
Streptococcus pneumoniae: NOT DETECTED
Streptococcus species: NOT DETECTED
VANCOMYCIN RESISTANCE: NOT DETECTED

## 2016-06-24 LAB — COMPREHENSIVE METABOLIC PANEL
ALT: 7 U/L — AB (ref 14–54)
AST: 15 U/L (ref 15–41)
Albumin: 2.1 g/dL — ABNORMAL LOW (ref 3.5–5.0)
Alkaline Phosphatase: 86 U/L (ref 38–126)
Anion gap: 7 (ref 5–15)
BUN: 31 mg/dL — ABNORMAL HIGH (ref 6–20)
CHLORIDE: 115 mmol/L — AB (ref 101–111)
CO2: 22 mmol/L (ref 22–32)
Calcium: 8.3 mg/dL — ABNORMAL LOW (ref 8.9–10.3)
Creatinine, Ser: 1.93 mg/dL — ABNORMAL HIGH (ref 0.44–1.00)
GFR, EST AFRICAN AMERICAN: 30 mL/min — AB (ref >60)
GFR, EST NON AFRICAN AMERICAN: 26 mL/min — AB (ref >60)
Glucose, Bld: 94 mg/dL (ref 65–99)
POTASSIUM: 3.5 mmol/L (ref 3.5–5.1)
Sodium: 144 mmol/L (ref 135–145)
Total Bilirubin: 0.7 mg/dL (ref 0.3–1.2)
Total Protein: 5.5 g/dL — ABNORMAL LOW (ref 6.5–8.1)

## 2016-06-24 LAB — CBC
HEMATOCRIT: 31.5 % — AB (ref 36.0–46.0)
HEMOGLOBIN: 9.7 g/dL — AB (ref 12.0–15.0)
MCH: 28.1 pg (ref 26.0–34.0)
MCHC: 30.8 g/dL (ref 30.0–36.0)
MCV: 91.3 fL (ref 78.0–100.0)
PLATELETS: 218 10*3/uL (ref 150–400)
RBC: 3.45 MIL/uL — AB (ref 3.87–5.11)
RDW: 15.1 % (ref 11.5–15.5)
WBC: 10.4 10*3/uL (ref 4.0–10.5)

## 2016-06-24 LAB — ECHOCARDIOGRAM COMPLETE
HEIGHTINCHES: 63 in
Weight: 2620.83 oz

## 2016-06-24 LAB — HEPARIN LEVEL (UNFRACTIONATED): HEPARIN UNFRACTIONATED: 0.66 [IU]/mL (ref 0.30–0.70)

## 2016-06-24 LAB — TSH: TSH: 1.301 u[IU]/mL (ref 0.350–4.500)

## 2016-06-24 LAB — MAGNESIUM: MAGNESIUM: 1.9 mg/dL (ref 1.7–2.4)

## 2016-06-24 MED ORDER — POTASSIUM CHLORIDE 10 MEQ/100ML IV SOLN
10.0000 meq | INTRAVENOUS | Status: AC
  Administered 2016-06-24 (×4): 10 meq via INTRAVENOUS
  Filled 2016-06-24 (×4): qty 100

## 2016-06-24 NOTE — Progress Notes (Signed)
PROGRESS NOTE    [name]  [id number] DOB: December 02, 1951 DOA: 06/22/2016 PCP: Kandice Hams, MD    Brief Narrative:  Patient is a 65 year old female bedbound for the past 3 years nursing home resident with history of severe debility, atrial fibrillation, hypertension, chronic kidney disease stage III, obstructive sleep apnea, CHF, morbid obesity, history of seizures presented to the ED with status epilepticus. Patient initially loaded with Keppra and placed on IV Keppra twice daily. Patient had also presented with shortness of breath and tachypnea. Due to patient's mobility status, presentation was shortness of breath and tachypnea concern was for PE and a such VQ scan was ordered. On route to VQ scan patient noted to have another episode of seizures and had to be loaded with IV Depakote and IV Keppra and doses of Keppra increased and patient placed on Depakote as well as Vimpat. Patient also on heparin drip. Patient opens eyes to verbal stimuli however not following commands and nonresponsive which her husband is not her baseline. Patient also noted on the night of 06/22/2016 to go into SVT requiring adenosine and now on scheduled IV Lopressor. Neurology and cardiology have been consulted.   Assessment & Plan:   Principal Problem:   Status epilepticus ([place]) Active Problems:   PAF (paroxysmal atrial fibrillation) (HCC)   Chronic combined systolic and diastolic CHF (congestive heart failure) (HCC)   SOB (shortness of breath)   Total self-care deficit   AKI (acute kidney injury) ([place])   CKD (chronic kidney disease) stage 3, GFR 30-59 ml/min   Malignant HTN with heart disease, w/o CHF, with chronic kidney disease   SVT (supraventricular tachycardia) (HCC)   Hematoma of left lower extremity  #1 status epilepticus Patient presented with generalized tonic-clonic seizures after episode of confusion and disorientation. Patient was given a loading dose of Keppra and placed on Keppra 750  mg twice daily. CT head which was done showed prior bilateral occipital ischemic changes. There was also concern that patient may have had arrhythmias which may have led to decreased cerebral perfusion provoking seizures secondary to bilateral occipital ischemic changes noted. The night of 06/22/2016, patient was noted to have episode of seizures on route to VQ scan and received Ativan at that time. Neurology was called and patient was loaded with 1 g of IV Depakote followed by 500 mg of IV Depakote twice daily as well as IV Keppra and Keppra dose increased to 1 g IV twice daily. Patient was also given IV Vimpat and placed on 100 mg of Vimpat twice daily. Patient with no signs of infectious etiology. Patient with low-grade fever this morning and also leukocytosis however these may have likely been secondary to seizure episodes. Leukocytosis trending down. EEG consistent with status epilepticus. Patient more alert today and following some commands. Concern for possible acute ischemic CVA and a such MRI of the brain has been ordered per neurology. Patient with ruptured/bleeding hematoma this morning and as such anticoagulation was discontinued. Patient with prior history of GI bleed and hematoma and a such on an anticoagulation for A. fib. Will discontinue heparin in light of recent bleeding hematoma. Neurology following.  #2 SVT Patient noted to go into SVT the night of 06/22/2016, and had to be given adenosine 6 mg IV 1 with improvement and subsequently placed on scheduled Lopressor.TSH within normal limits at 1.301. 2-D echo pending. Continue scheduled IV Lopressor. Cardiology following and appreciate input and recommendations.   #3 paroxysmal atrial fibrillation Currently rate controlled on scheduled IV Lopressor.  Patient following some commands. Speech therapy to reevaluate prior to starting oral medications. Patient on anticoagulation in the past secondary to history of GI bleed and hematoma. Discontinue  IV heparin due to bleeding hematoma noted this morning.   #4 shortness of breath/tachypnea Patient presented with shortness of breath and tachypnea with a history of being bedbound for approximately 3 years. Concern for pulmonary emboli. VQ scan was ordered however on route to VQ scan patient had seizures and a such VQ scan is still pending. D/C IV heparin drip. Repeat chest x-ray 06/23/2016 with no significant change. Follow.  #5 malignant hypertension in the setting of chronic kidney disease Patient currently following some commands. Continue scheduled IV Lopressor. Hydralazine as needed. Speech therapy to reevaluate as patient continues to improve for swallowing.  #6 chronic kidney disease stage III Stable. Follow.  #7 leukocytosis Likely secondary to acute seizures. WBC trended down. Patient satting 100% on 2.5 L nasal cannula. Repeat chest x-ray with no significant new infiltrates noted. Patient with no respiratory symptoms. Urinalysis was nitrite negative leukocytes negative. Patient with low-grade temp of 100.1. Will hold off on antibiotics at this time.  #8 combined chronic systolic and diastolic heart failure Currently stable.  #9 left lower extremity bleeding hematoma Patient noted to have a bleeding/ruptured hematoma this morning and pressure dressing in place. No further bleeding per nursing. Will discontinue IV heparin.    DVT prophylaxis: SCD Code Status: Full Family Communication: Updated husband at bedside. Disposition Plan: Remain the step down unit. Back to nursing facility when medically stable.   Consultants:   Neurology: Dr. Tasia Catchings 06/22/2016  Cardiology: Dr. Meda Coffee 06/23/2016  Procedures:  EEG 06/23/2016  CT head 06/22/2016  Chest x-ray 06/22/2016, 06/23/2016  Antimicrobials:   None   Subjective: Patient with continuous EEG leads on. Patient more alert and following some commands. Patient states what her name is. Patient does not know where she  is. Patient answering some questions. Events early this morning of left ruptured hematoma noted.  Objective: Filed Vitals:   06/23/16 2301 06/24/16 0315 06/24/16 0800 06/24/16 0801  BP: 111/63 143/72 140/72 140/72  Pulse: 163 98 85 87  Temp: 99.6 F (37.6 C) 100.1 F (37.8 C)  99.6 F (37.6 C)  TempSrc: Axillary Axillary  Axillary  Resp: 27 25 21 21   Height:      Weight:      SpO2: 99% 100% 100% 100%    Intake/Output Summary (Last 24 hours) at 06/24/16 1006 Last data filed at 06/24/16 0928  Gross per 24 hour  Intake 813.56 ml  Output      0 ml  Net 813.56 ml   Filed Weights   06/22/16 1331 06/22/16 1931  Weight: 101.606 kg (224 lb) 74.3 kg (163 lb 12.8 oz)    Examination:  General exam: Eyes open. Following some commands.  Respiratory system: Clear to auscultation anterior lung fields. Respiratory effort normal. Cardiovascular system: Irregularly irregular. No JVD, murmurs, rubs, gallops or clicks. No pedal edema. Gastrointestinal system: Abdomen is nondistended, soft and nontender. No organomegaly or masses felt. Normal bowel sounds heard. Central nervous system: Alert and oriented. No focal neurological deficits. Extremities: 3-4/5 RUE strength, 2/5 LUE strength Skin: No rashes, lesions or ulcers. Left lower extremity with pressure dressing status post ruptured hematoma. Psychiatry: Judgement and insight appear poor. Mood & affect flat.     Data Reviewed: I have personally reviewed following labs and imaging studies  CBC:  Recent Labs Lab 06/22/16 1240 06/22/16 1255 06/23/16 0300 06/24/16  0509  WBC 12.1*  --  17.9* 10.4  NEUTROABS 9.6*  --   --   --   HGB 10.2* 10.5* 10.4* 9.7*  HCT 32.9* 31.0* 33.3* 31.5*  MCV 92.9  --  91.5 91.3  PLT 231  --  207 [id number]   Basic Metabolic Panel:  Recent Labs Lab 06/22/16 1240 06/22/16 1255 06/23/16 0300 06/24/16 0509  NA 142 145 143 144  K 3.4* 3.4* 4.2 3.5  CL 113* 111 114* 115*  CO2 20*  --  18* 22  GLUCOSE  185* 185* 88 94  BUN 38* 45* 36* 31*  CREATININE 1.63* 1.70* 1.72* 1.93*  CALCIUM 8.5*  --  8.3* 8.3*  MG  --   --  1.9 1.9  PHOS  --   --  3.6  --    GFR: Estimated Creatinine Clearance: 27.7 mL/min (by C-G formula based on Cr of 1.93). Liver Function Tests:  Recent Labs Lab 06/22/16 1240 06/23/16 0300 06/24/16 0509  AST 23 20 15   ALT 9* 9* 7*  ALKPHOS 106 94 86  BILITOT 0.7 0.6 0.7  PROT 6.3* 5.7* 5.5*  ALBUMIN 2.8* 2.3* 2.1*   No results for input(s): LIPASE, AMYLASE in the last 168 hours. No results for input(s): AMMONIA in the last 168 hours. Coagulation Profile:  Recent Labs Lab 06/22/16 1240  INR 0.94   Cardiac Enzymes: No results for input(s): CKTOTAL, CKMB, CKMBINDEX, TROPONINI in the last 168 hours. BNP (last 3 results) No results for input(s): PROBNP in the last 8760 hours. HbA1C: No results for input(s): HGBA1C in the last 72 hours. CBG: No results for input(s): GLUCAP in the last 168 hours. Lipid Profile: No results for input(s): CHOL, HDL, LDLCALC, TRIG, CHOLHDL, LDLDIRECT in the last 72 hours. Thyroid Function Tests:  Recent Labs  06/24/16 0509  TSH 1.301   Anemia Panel: No results for input(s): VITAMINB12, FOLATE, FERRITIN, TIBC, IRON, RETICCTPCT in the last 72 hours. Sepsis Labs:  Recent Labs Lab 06/22/16 1255  LATICACIDVEN 1.44    Recent Results (from the past 240 hour(s))  Blood Culture (routine x 2)     Status: None (Preliminary result)   Collection Time: 06/22/16  2:01 PM  Result Value Ref Range Status   Specimen Description BLOOD LEFT WRIST  Final   Special Requests BOTTLES DRAWN AEROBIC ONLY 5CC  Final   Culture NO GROWTH 1 DAY  Final   Report Status PENDING  Incomplete  Blood Culture (routine x 2)     Status: None (Preliminary result)   Collection Time: 06/22/16  2:05 PM  Result Value Ref Range Status   Specimen Description BLOOD LEFT HAND  Final   Special Requests BOTTLES DRAWN AEROBIC ONLY 5CC  Final   Culture NO GROWTH  1 DAY  Final   Report Status PENDING  Incomplete  Urine culture     Status: Abnormal   Collection Time: 06/22/16  3:52 PM  Result Value Ref Range Status   Specimen Description URINE, CATHETERIZED  Final   Special Requests NONE  Final   Culture MULTIPLE SPECIES PRESENT, SUGGEST RECOLLECTION (A)  Final   Report Status 06/23/2016 FINAL  Final  MRSA PCR Screening     Status: None   Collection Time: 06/23/16  1:03 AM  Result Value Ref Range Status   MRSA by PCR NEGATIVE NEGATIVE Final    Comment:        The GeneXpert MRSA Assay (FDA approved for NASAL specimens only), is one component of a  comprehensive MRSA colonization surveillance program. It is not intended to diagnose MRSA infection nor to guide or monitor treatment for MRSA infections.          Radiology Studies: Ct Head Wo Contrast  06/22/2016  CLINICAL DATA:  Unresponsive at nursing home.  Seizures. EXAM: CT HEAD WITHOUT CONTRAST TECHNIQUE: Contiguous axial images were obtained from the base of the skull through the vertex without intravenous contrast. 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 There is no evidence for acute hemorrhage, hydrocephalus, mass lesion, or abnormal extra-axial fluid collection. No definite CT evidence for acute infarction. Diffuse loss of parenchymal volume is consistent with atrophy. Patchy low attenuation in the deep hemispheric and periventricular white matter is nonspecific, but likely reflects chronic microvascular ischemic demyelination. The visualized paranasal sinuses and mastoid air cells are clear. IMPRESSION: Stable.  Atrophy with chronic small vessel white matter disease. 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 Electronically Signed   By: Misty Stanley M.D.   On: 06/22/2016 13:48   Dg Chest Port 1 View  06/23/2016  CLINICAL DATA:  Sob per chart, pt unable to give any history EXAM: PORTABLE CHEST 1 VIEW COMPARISON:   06/22/2016 FINDINGS: Heart is enlarged. There are persistent bilateral pulmonary infiltrates, probably stable in appearance accounting for differences in technique. Right pleural effusion or pleural thickening again noted. Bilateral costophrenic angle blunting again noted. IMPRESSION: 1. Cardiomegaly. 2. Persistent bilateral pleural changes and parenchymal opacities. Electronically Signed   By: Nolon Nations M.D.   On: 06/23/2016 10:47   Dg Chest Port 1 View  06/22/2016  CLINICAL DATA:  Grand mal seizure.  Possible aspiration. EXAM: PORTABLE CHEST 1 VIEW COMPARISON:  01/03/2016 FINDINGS: There is moderate cardiac enlargement. Aortic atherosclerosis noted. Asymmetric opacification within the right midlung and right upper lobe is identify. Mild diffuse pulmonary vascular congestion noted. IMPRESSION: 1. Cardiac enlargement and aortic atherosclerosis. 2. Mild asymmetric increased opacification within the right upper lobe and right midlung which may reflect underlying aspiration, pneumonia or asymmetric edema. Electronically Signed   By: Kerby Moors M.D.   On: 06/22/2016 14:28        Scheduled Meds: . acetaminophen  500 mg Oral Q6H  . allopurinol  100 mg Oral Daily  . amLODipine  10 mg Oral QHS  . atorvastatin  20 mg Oral QHS  . famotidine (PEPCID) IV  20 mg Intravenous Q12H  . hydrALAZINE  100 mg Oral Q8H  . isosorbide dinitrate  30 mg Oral BID  . lacosamide (VIMPAT) IV  100 mg Intravenous Q12H  . levETIRAcetam  1,000 mg Intravenous Q12H  . metoCLOPramide (REGLAN) injection  5 mg Intravenous Q8H  . metoprolol  5 mg Intravenous Q6H  . polyethylene glycol  17 g Oral BID  . potassium chloride  10 mEq Intravenous Q1 Hr x 4  . sodium chloride flush  3 mL Intravenous Q12H  . traZODone  50 mg Oral QHS  . valproate sodium  500 mg Intravenous Q12H   Continuous Infusions: . sodium chloride 50 mL/hr at 06/23/16 1700     LOS: 1 day    Time spent: [place], MD [facility] Pager [phone]  If 7PM-7AM, please contact night-coverage [email or web address] Password [email or web address] 06/24/2016, 10:06 AM

## 2016-06-24 NOTE — Progress Notes (Signed)
LTM EEG discontinued per Dr. Leonel Ramsay. Results pending.

## 2016-06-24 NOTE — Evaluation (Signed)
Clinical/Bedside Swallow Evaluation Patient Details  Name: [name] MRN: [id number] Date of Birth: [birth year]  Today's Date: 06/24/2016 Time: SLP Start Time (ACUTE ONLY): 66 SLP Stop Time (ACUTE ONLY): 1555 SLP Time Calculation (min) (ACUTE ONLY): 25 min  Past Medical History:  Past Medical History  Diagnosis Date  . Asthma   . Bronchitis   . Allergic rhinitis   . HTN (hypertension)   . Hyperlipidemia   . Obesity   . OSA (obstructive sleep apnea)     poor compliance with cpap  . CHF (congestive heart failure) ([place])   . Biventricular failure ([place])     compensated  . Psoriasis   . Stasis edema     of lower extremities  . Depression   . Situational stress   . Anemia   . Gout   . Chronic anticoagulation     off due to GIB, thigh hematoma  . Yeast infection involving the vagina and surrounding area   . Atrial fibrillation with RVR ([place])   . GERD (gastroesophageal reflux disease)   . Chronic bronchitis ([place])     "get it q yr" (08/10/2013)  . Shortness of breath     "all the time" (08/10/2013)  . On home oxygen therapy     "2L during the night and prn during the day" (08/10/2013)  . Type II diabetes mellitus ([place])   . History of blood transfusion     "few during my lifetime; last time was 4 days ago when I got 2 units" (08/10/2013)  . Stroke [facility]) ?2007    denies residuals on 08/10/2013  . DJD (degenerative joint disease)   . Bleeding     "from my skin folds; just won't stop" (08/10/2013)  . Complication of anesthesia     "they gave me too much; I stayed out of it for awhile" (08/10/2013)  . Anxiety   . Kidney stones   . Kidney disease, chronic, stage III (GFR 30-59 ml/min)   . Chronic kidney disease, stage IV (severe) ([place])   . Morbid obesity ([place])   . Diastolic CHF ([place])   . Pneumonia     "said I did on 08/03/2013 then they ruled it out" (08/10/2013)  . HCAP (healthcare-associated pneumonia) 01/03/2016  . Seizures ([place])    Past Surgical History:  Past Surgical  History  Procedure Laterality Date  . Appendectomy    . Cholecystectomy    . Total knee arthroplasty Left ~ 2006  . Total abdominal hysterectomy    . Back surgery    . Cystectomy Left     hand  . Tonsillectomy    . Joint replacement    . Lumbar disc surgery    . Colonoscopy N/A 08/16/2013    Procedure: COLONOSCOPY;  Surgeon: Beryle Beams, MD;  Location: [facility];  Service: Endoscopy;  Laterality: N/A;  . Hematoma evacuation Right 09/10/2013    Procedure: EVACUATION OF RIGHT LEG HEMATOMA AND EXCISION [place] RIGHT LEG ;  Surgeon: Merrie Roof, MD;  Location: [facility];  Service: General;  Laterality: Right;  . Debridement and closure wound Right 09/22/2013    Procedure: IRRIGATION AND DEBRIDEMENT SURGICAL PREP PLACEMENT OF [place]  ;  Surgeon: Irene Limbo, MD;  Location: [facility];  Service: Plastics;  Laterality: Right;  . Skin split graft Right 10/07/2013    Procedure: SPLIT THICKNESS SKIN GRAFT RIGHT THIGH TO RIGHT LEG, PLACEMENT OF A CEL AND WOUND VAC TO RIGHT THIGH  ;  Surgeon: Arnoldo Hooker  Thimmappa, MD;  Location: [facility];  Service: Plastics;  Laterality: Right;   HPI:      Assessment / Plan / Recommendation Clinical Impression  Oral care completed with suction. Reflexive cough noted following oral care. Pt unable to follow commands to fully assess oral motor strength and function. Voice quality was noted to be low in intensity, with limited verbalizations elicited. Pt was given ice chips x2, with extended oral prep and delayed swallow reflex noted. Reflexive cough was noted after the swallow. No further po presentations were given at this time, as aspiration risk is significant. Will continue to follow pt for readiness for po intake and/or objective study.     Aspiration Risk  Severe aspiration risk    Diet Recommendation NPO   Medication Administration: Via alternative means    Other  Recommendations Oral Care Recommendations: Oral care QID Other Recommendations:  Have oral suction available   Follow up Recommendations   (TBD)    Frequency and Duration min 2x/week  1 week       Prognosis Prognosis for Safe Diet Advancement: Fair Barriers to Reach Goals: Cognitive deficits      Swallow Study   General  Pt with left gaze preference, does not consistently follow commands   Oral/Motor/Sensory Function Overall Oral Motor/Sensory Function:  (unable to fully assess)   Ice Chips Ice chips: Impaired Presentation: Spoon Oral Phase Impairments: Reduced labial seal;Reduced lingual movement/coordination Oral Phase Functional Implications: Prolonged oral transit Pharyngeal Phase Impairments: Suspected delayed Swallow;Cough - Immediate   Thin Liquid Thin Liquid: Not tested    Nectar Thick Nectar Thick Liquid: Not tested   Honey Thick Honey Thick Liquid: Not tested   Puree Puree: Not tested    Shonna Chock 06/24/2016,4:09 PM  Vayden Weinand B. Quentin Ore [facility], [place] [phone]

## 2016-06-24 NOTE — Progress Notes (Signed)
Subjective: Husband reports that she appears more alert today  Exam: Filed Vitals:   06/24/16 0801 06/24/16 1057  BP: 140/72 160/73  Pulse: 87 90  Temp: 99.6 F (37.6 C) 98.8 F (37.1 C)  Resp: 21 22   Gen: In bed, NAD Resp: non-labored breathing, no acute distress Abd: soft, nt  Neuro: MS: Awake, able to tell me her name but then with some perseveration on her name, follows simple commands. CN: She has a clear leftward gaze preference. Motor: Appears to move the right side more than the left, but does not cooperate for formal testing Sensory: Responds to noxious stimuli in all 4 extremities  Pertinent Labs: Elevated creatinine at 1.9 yesterday, increased from 1.7 the day before  Impression: 65 year old female with left-sided weakness and right-sided PLEDs following prolonged seizure. She appears improved by EEG, and husband reports improvement clinically. I am concerned that she may have had a stroke, and will need to get an MRI to assess for this. Also possible, is that she had injury created by prolonged seizure resulting in the PLEDs. Her EEG now appears to be more intermittent sharp waves as opposed to PLEDs.  Recommendations: 1) continue Keppra, vimpat, VPA 2) VPA level tomorrow 3) D/C EEG and obtain MRI brain  Roland Rack, MD [facility] [phone]  If 7pm- 7am, please page neurology on call as listed in [place].

## 2016-06-24 NOTE — Progress Notes (Signed)
Patient Name: [name] Date of Encounter: 06/24/2016  Principal Problem:   Status epilepticus ([place]) Active Problems:   PAF (paroxysmal atrial fibrillation) (HCC)   Chronic combined systolic and diastolic CHF (congestive heart failure) (HCC)   SOB (shortness of breath)   Total self-care deficit   AKI (acute kidney injury) ([place])   CKD (chronic kidney disease) stage 3, GFR 30-59 ml/min   Malignant HTN with heart disease, w/o CHF, with chronic kidney disease   SVT (supraventricular tachycardia) ([place])   Primary Cardiologist: Last seen in CHF clinic in Jan. 2016 Patient Profile: Ms. Parilla is a 65 year old female with a past medical history of chronic diastolic CHF, CKD, CVA, PAF not on anticoagulation due to history of severe GI bleed and severe hematoma. She resides at a SNF and is bedridden. She presented to the ED on 06/22/16 with confusion and seizure activity. HR was elevated in 130's-140's, with PAC's.   SUBJECTIVE: Did not answer my questions, did follow simple commands for Neuro.     OBJECTIVE Filed Vitals:   06/23/16 2301 06/24/16 0315 06/24/16 0800 06/24/16 0801  BP: 111/63 143/72 140/72 140/72  Pulse: 163 98 85 87  Temp: 99.6 F (37.6 C) 100.1 F (37.8 C)  99.6 F (37.6 C)  TempSrc: Axillary Axillary  Axillary  Resp: 27 25 21 21   Height:      Weight:      SpO2: 99% 100% 100% 100%    Intake/Output Summary (Last 24 hours) at 06/24/16 0934 Last data filed at 06/24/16 0928  Gross per 24 hour  Intake 1793.43 ml  Output      0 ml  Net 1793.43 ml   Filed Weights   06/22/16 1331 06/22/16 1931  Weight: 224 lb (101.606 kg) 163 lb 12.8 oz (74.3 kg)    PHYSICAL EXAM General: Well developed, well nourished,obese female in no acute distress. Head: Normocephalic, atraumatic.  Neck: Supple without bruits, hard to assess JVD. Lungs:  Resp regular and unlabored, CTA. Heart: IRRR, S1, S2, no S3, S4, or murmur; no rub. Abdomen: Soft, non-tender,  non-distended, BS + x 4.  Extremities: No clubbing, cyanosis, generalized edema.  Neuro: Alert and oriented X 3. Moves all extremities spontaneously. Psych: Normal affect.  LABS: CBC: Recent Labs  06/22/16 1240  06/23/16 0300 06/24/16 0509  WBC 12.1*  --  17.9* 10.4  NEUTROABS 9.6*  --   --   --   HGB 10.2*  < > 10.4* 9.7*  HCT 32.9*  < > 33.3* 31.5*  MCV 92.9  --  91.5 91.3  PLT 231  --  207 218  < > = values in this interval not displayed. INR: Recent Labs  06/22/16 1240  INR [id number]   Basic Metabolic Panel: Recent Labs  06/23/16 0300 06/24/16 0509  NA 143 144  K 4.2 3.5  CL 114* 115*  CO2 18* 22  GLUCOSE 88 94  BUN 36* 31*  CREATININE 1.72* 1.93*  CALCIUM 8.3* 8.3*  MG 1.9 1.9  PHOS 3.6  --    Liver Function Tests: Recent Labs  06/23/16 0300 06/24/16 0509  AST 20 15  ALT 9* 7*  ALKPHOS 94 86  BILITOT 0.6 0.7  PROT 5.7* 5.5*  ALBUMIN 2.3* 2.1*   BNP: No results found for: BNP PRO B NATRIURETIC PEPTIDE (BNP)  Date/Time Value Ref Range Status  05/11/2014 11:16 AM 523.9* 0 - 125 pg/mL Final  01/25/2014 03:27 PM 1266.0* 0 - 125 pg/mL Final  Thyroid Function Tests: Recent Labs  06/24/16 0509  TSH 1.301      Current facility-administered medications:  .  0.45 % sodium chloride infusion, , Intravenous, Continuous, Maren Reamer, MD, Last Rate: 50 mL/hr at 06/23/16 1700 .  0.9 %  sodium chloride infusion, 250 mL, Intravenous, PRN, Maren Reamer, MD .  acetaminophen (TYLENOL) tablet 500 mg, 500 mg, Oral, Q6H, Dawn T Langeland, MD .  allopurinol (ZYLOPRIM) tablet 100 mg, 100 mg, Oral, Daily, Maren Reamer, MD, 100 mg at 06/23/16 0952 .  amLODipine (NORVASC) tablet 10 mg, 10 mg, Oral, QHS, Maren Reamer, MD, 10 mg at 06/22/16 2200 .  atorvastatin (LIPITOR) tablet 20 mg, 20 mg, Oral, QHS, Maren Reamer, MD, 20 mg at 06/22/16 2200 .  famotidine (PEPCID) IVPB 20 mg premix, 20 mg, Intravenous, Q12H, Eugenie Filler, MD, 20 mg at 06/23/16  2244 .  hydrALAZINE (APRESOLINE) injection 10 mg, 10 mg, Intravenous, Q6H PRN, Maren Reamer, MD, 10 mg at 06/23/16 2026 .  hydrALAZINE (APRESOLINE) tablet 100 mg, 100 mg, Oral, Q8H, Maren Reamer, MD, 100 mg at 06/22/16 2200 .  isosorbide dinitrate (ISORDIL) tablet 30 mg, 30 mg, Oral, BID, Maren Reamer, MD, 30 mg at 06/22/16 2200 .  lacosamide (VIMPAT) 100 mg in sodium chloride 0.9 % 25 mL IVPB, 100 mg, Intravenous, Q12H, Wallie Char, 100 mg at 06/23/16 2328 .  levETIRAcetam (KEPPRA) 1,000 mg in sodium chloride 0.9 % 100 mL IVPB, 1,000 mg, Intravenous, Q12H, Ivor Costa, MD, 1,000 mg at 06/24/16 0928 .  LORazepam (ATIVAN) injection 1-2 mg, 1-2 mg, Intravenous, Q2H PRN, Maren Reamer, MD, 2 mg at 06/23/16 0558 .  metoCLOPramide (REGLAN) injection 5 mg, 5 mg, Intravenous, Q8H, Marina S Naplate, PA-C, 5 mg at 06/24/16 [id number] .  metoprolol (LOPRESSOR) injection 5 mg, 5 mg, Intravenous, Q6H, Debby Crosley, MD, 5 mg at 06/24/16 0609 .  polyethylene glycol (MIRALAX / GLYCOLAX) packet 17 g, 17 g, Oral, BID, Maren Reamer, MD, 17 g at 06/22/16 2200 .  potassium chloride 10 mEq in 100 mL IVPB, 10 mEq, Intravenous, Q1 Hr x 4, Eugenie Filler, MD, 10 mEq at 06/24/16 0926 .  sodium chloride flush (NS) 0.9 % injection 3 mL, 3 mL, Intravenous, Q12H, Maren Reamer, MD, 3 mL at 06/24/16 0928 .  sodium chloride flush (NS) 0.9 % injection 3 mL, 3 mL, Intravenous, PRN, Maren Reamer, MD .  traZODone (DESYREL) tablet 50 mg, 50 mg, Oral, QHS, Maren Reamer, MD, 50 mg at 06/22/16 2200 .  valproate (DEPACON) 500 mg in dextrose 5 % 50 mL IVPB, 500 mg, Intravenous, Q12H, Ivor Costa, MD, 500 mg at 06/23/16 2113 . sodium chloride 50 mL/hr at 06/23/16 1700    TELE: NSR, with frequent PAC's.         ECG: NSR, PAC's.   Echo: pending.   Radiology/Studies: Ct Head Wo Contrast  06/22/2016  CLINICAL DATA:  Unresponsive at nursing home.  Seizures. EXAM: CT HEAD WITHOUT CONTRAST TECHNIQUE:  Contiguous axial images were obtained from the base of the skull through the vertex without intravenous contrast. 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 There is no evidence for acute hemorrhage, hydrocephalus, mass lesion, or abnormal extra-axial fluid collection. No definite CT evidence for acute infarction. Diffuse loss of parenchymal volume is consistent with atrophy. Patchy low attenuation in the deep hemispheric and periventricular white matter is nonspecific, but likely reflects  chronic microvascular ischemic demyelination. The visualized paranasal sinuses and mastoid air cells are clear. IMPRESSION: Stable.  Atrophy with chronic small vessel white matter disease. 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 Electronically Signed   By: Misty Stanley M.D.   On: 06/22/2016 13:48   Dg Chest Port 1 View  06/23/2016  CLINICAL DATA:  Sob per chart, pt unable to give any history EXAM: PORTABLE CHEST 1 VIEW COMPARISON:  06/22/2016 FINDINGS: Heart is enlarged. There are persistent bilateral pulmonary infiltrates, probably stable in appearance accounting for differences in technique. Right pleural effusion or pleural thickening again noted. Bilateral costophrenic angle blunting again noted. IMPRESSION: 1. Cardiomegaly. 2. Persistent bilateral pleural changes and parenchymal opacities. Electronically Signed   By: Nolon Nations M.D.   On: 06/23/2016 10:47   Dg Chest Port 1 View  06/22/2016  CLINICAL DATA:  Grand mal seizure.  Possible aspiration. EXAM: PORTABLE CHEST 1 VIEW COMPARISON:  01/03/2016 FINDINGS: There is moderate cardiac enlargement. Aortic atherosclerosis noted. Asymmetric opacification within the right midlung and right upper lobe is identify. Mild diffuse pulmonary vascular congestion noted. IMPRESSION: 1. Cardiac enlargement and aortic atherosclerosis. 2. Mild asymmetric increased opacification within the right upper lobe and right  midlung which may reflect underlying aspiration, pneumonia or asymmetric edema. Electronically Signed   By: Kerby Moors M.D.   On: 06/22/2016 14:28     Current Medications:  . acetaminophen  500 mg Oral Q6H  . allopurinol  100 mg Oral Daily  . amLODipine  10 mg Oral QHS  . atorvastatin  20 mg Oral QHS  . famotidine (PEPCID) IV  20 mg Intravenous Q12H  . hydrALAZINE  100 mg Oral Q8H  . isosorbide dinitrate  30 mg Oral BID  . lacosamide (VIMPAT) IV  100 mg Intravenous Q12H  . levETIRAcetam  1,000 mg Intravenous Q12H  . metoCLOPramide (REGLAN) injection  5 mg Intravenous Q8H  . metoprolol  5 mg Intravenous Q6H  . polyethylene glycol  17 g Oral BID  . potassium chloride  10 mEq Intravenous Q1 Hr x 4  . sodium chloride flush  3 mL Intravenous Q12H  . traZODone  50 mg Oral QHS  . valproate sodium  500 mg Intravenous Q12H   . sodium chloride 50 mL/hr at 06/23/16 1700    ASSESSMENT AND PLAN: Principal Problem:   Status epilepticus ([place]) Active Problems:   PAF (paroxysmal atrial fibrillation) (HCC)   Chronic combined systolic and diastolic CHF (congestive heart failure) (HCC)   SOB (shortness of breath)   Total self-care deficit   AKI (acute kidney injury) ([place])   CKD (chronic kidney disease) stage 3, GFR 30-59 ml/min   Malignant HTN with heart disease, w/o CHF, with chronic kidney disease   SVT (supraventricular tachycardia) ([place])  1. PAF: Review of tele shows NSR with frequent PAC's, according to notes from yesterday had some episodes of Afib and SVT with rates up to 205. She is rate controlled overnight with metoprolol 5mg  IV, continue same.   Not on chronic anticoagulation due to history of GI bleed and hematoma. Was on heparin gtt, it was stopped this am for left lower leg hematoma rupture. It is suspected that her arrhythmia is due to suspected CVA and status epilepticus.   2. Chronic diastolic CHF: Echo pending. On beta blocker, does not appear volume overloaded on exam.    3. Status epilepticus: On continuous EEG, management per Neuro.     Signed, Arbutus Leas , NP 9:34 AM 06/24/2016 Pager [phone]  Patient  examined chart reviewed. Agree with above no heparin due to hematoma Arhhythmia benign and  Related to seizures continue iv metoprolol. Control of seizures per neurology  .Jenkins Rouge

## 2016-06-24 NOTE — Procedures (Signed)
Electroencephalogram report- LTM  Ordering Physician : Dr. Nicole Kindred EEG number: [phone]    Beginning date and time: 06/23/2016 11:52AM Ending date and time:  06/24/2016 9:50AM  Day of study: 2  Medications include: Keppra, Vimpat  HISTORY: This 24 hours of intensive EEG monitoring with simultaneous video monitoring was performed for this patient with unresponsiveness and seizures.  This EEG was requested to rule out subclinical electrographic seizures.  TECHNICAL DESCRIPTION:  The study consists of a continuous 16-channel multi-montage digital video EEG recording with twenty-one electrodes placed according to the International 10-20 System. Additional leads included eye leads, true temporal leads (T1, T2), and an EKG lead. Activation procedures were not done due to mental status.  REPORT: The background activity consists of polymorphic delta and theta activity with best of about 5.5Hz . The background is asymmetric with delta activity more prominent in the right hemisphere. No clear sleep architecture was seen. Frequent spikes were seen in the right temporal region, phase reversing at T4 in the bipolar longitudinal montage.   There were no pushbutton activations events during this recording.   INTERPRETATION: This is an abnormal EEG due to: 1) Focal slowing and spikes in the right temporal region 2) Diffuse background slowing  Clinical Correlation: This EEG shows focal neuronal dysfunction with epileptogenic potential in the right temporal region. There is also superimposed diffuse encephalopathy. No electrographic seizures were seen.

## 2016-06-24 NOTE — Progress Notes (Signed)
PHARMACY - PHYSICIAN COMMUNICATION CRITICAL VALUE ALERT - BLOOD CULTURE IDENTIFICATION (BCID)  Results for orders placed or performed during the hospital encounter of 06/22/16  Blood Culture ID Panel (Reflexed) (Collected: 06/22/2016  2:05 PM)  Result Value Ref Range   Enterococcus species NOT DETECTED NOT DETECTED   Vancomycin resistance NOT DETECTED NOT DETECTED   Listeria monocytogenes NOT DETECTED NOT DETECTED   Staphylococcus species NOT DETECTED NOT DETECTED   Staphylococcus aureus NOT DETECTED NOT DETECTED   Methicillin resistance NOT DETECTED NOT DETECTED   Streptococcus species NOT DETECTED NOT DETECTED   Streptococcus agalactiae NOT DETECTED NOT DETECTED   Streptococcus pneumoniae NOT DETECTED NOT DETECTED   Streptococcus pyogenes NOT DETECTED NOT DETECTED   Acinetobacter baumannii NOT DETECTED NOT DETECTED   Enterobacteriaceae species NOT DETECTED NOT DETECTED   Enterobacter cloacae complex NOT DETECTED NOT DETECTED   Escherichia coli NOT DETECTED NOT DETECTED   Klebsiella oxytoca NOT DETECTED NOT DETECTED   Klebsiella pneumoniae NOT DETECTED NOT DETECTED   Proteus species NOT DETECTED NOT DETECTED   Serratia marcescens NOT DETECTED NOT DETECTED   Carbapenem resistance NOT DETECTED NOT DETECTED   Haemophilus influenzae NOT DETECTED NOT DETECTED   Neisseria meningitidis NOT DETECTED NOT DETECTED   Pseudomonas aeruginosa NOT DETECTED NOT DETECTED   Candida albicans NOT DETECTED NOT DETECTED   Candida glabrata NOT DETECTED NOT DETECTED   Candida krusei NOT DETECTED NOT DETECTED   Candida parapsilosis NOT DETECTED NOT DETECTED   Candida tropicalis NOT DETECTED NOT DETECTED   65 year old female admitted with prolonged seizure.  She has 1 of 2 blood cultures positive for Gram positive rods with nothing detected on BCID. This is likely a contaminant such as Diphtheroids and no antibiotic coverage is warranted.  Name of physician (or Provider) Contacted: Dr. Grandville Silos  Changes  to prescribed antibiotics required: No antibiotics warranted  Norva Riffle 06/24/2016  3:39 PM

## 2016-06-24 NOTE — Progress Notes (Signed)
While performing peri care on pt, nurse observe large hematoma on left lower leg. Hematoma is hard and painful to the touch. Left pedal pulses are palpable. Hematoma has been marked. Nurse contacted L. [place]uk and instructed to stop Heparin. Pt vital signs are stable and pt resting comfortably.

## 2016-06-24 NOTE — Progress Notes (Signed)
  Echocardiogram 2D Echocardiogram has been performed.  [name] M 06/24/2016, 2:54 PM

## 2016-06-24 NOTE — Clinical Social Work Note (Signed)
Clinical Social Work Assessment  Patient Details  Name: [name] MRN: [id number] Date of Birth: Sep 07, 1951  Date of referral:  06/24/16               Reason for consult:  Discharge Planning                Permission sought to share information with:  Facility Sport and exercise psychologist, Family Supports Permission granted to share information::  Yes, Verbal Permission Granted  Name::     Larrie  Agency::  [facility]  Relationship::  Husband  Contact Information:  [phone]  Housing/Transportation Living arrangements for the past 2 months:  [place] of Information:  Medical Team, Spouse Patient Interpreter Needed:  None Criminal Activity/Legal Involvement Pertinent to Current Situation/Hospitalization:  No - Comment as needed Significant Relationships:  Spouse Lives with:  Facility Resident Do you feel safe going back to the place where you live?  Yes Need for family participation in patient care:  Yes (Comment)  Care giving concerns:  Patient is from [facility].   Social Worker assessment / plan:  CSW met with patient. Husband at bedside. CSW introduced role and explained that discharge planning would be discussed. Patient not oriented. Patient's husband confirmed that she is from [facility] and plans to return. Patient has been living there for 3-3 1/2 years. No further concerns. CSW encouraged patient's husband to contact CSW as needed. CSW will continue to follow patient and her husband and facilitate discharge back to SNF once medically stable.  Employment status:  Retired Nurse, adult PT Recommendations:  Not assessed at this time Information / Referral to community resources:  Other (Comment Required) (Plan is to return to [facility].)  Patient/Family's Response to care:  Patient not oriented. Patient's husband agreeable to return to SNF. Patient's husband supportive and involved in patient's care. Patient's  husband appreciated social work intervention.  Patient/Family's Understanding of and Emotional Response to Diagnosis, Current Treatment, and Prognosis:  Patient not oriented. Patient's husband understands need for return to SNF once discharged. Patient's husband reports that he is happy with care at hospital thus far.  Emotional Assessment Appearance:  Appears stated age Attitude/Demeanor/Rapport:  Unable to Assess Affect (typically observed):  Unable to Assess Orientation:    Alcohol / Substance use:  Never Used Psych involvement (Current and /or in the community):  No (Comment)  Discharge Needs  Concerns to be addressed:  Care Coordination Readmission within the last 30 days:  No Current discharge risk:  Dependent with Mobility, Cognitively Impaired Barriers to Discharge:  No Barriers Identified   Candie Chroman, LCSW 06/24/2016, 3:45 PM

## 2016-06-24 NOTE — Progress Notes (Signed)
Around [place] entered room and noticed pt hematoma had ruptured with substantial amount of blood loss. Pt not symptomatic and VSS. Nurse applied pressure to leg to slow bleeding. Nurse wrapped leg with gauze and Ace bandage for continuous pressure. Notified L.Harduk of rupture. No orders given currently. Heparin has been stopped. Day shift nurse notified of situation during report and will continue to monitor pt.

## 2016-06-24 NOTE — Progress Notes (Signed)
[id number] RN Liaison Note  - This is a GIP, related, covered hospital admission of 06/22/16 per Dr. Shelbie Proctor with a [facility] Dx of cerebrovascular disease.   Patient was transferred to the hospital from [facility] facility where she resides. Patient was admitted with seizures.   [facility] was not notified by facilty or family of transfer.   Code status is full code.   Visited patient at bedside. Husband also present.  Patient minimally responsive. Husband reports she is not doing well. Patient appears comfortable.   She has 02 in place.   She is not eating. She is receiving IVF.  The patient has upper extremity weakness.  Spouse reports that patient is being evaluated for possible stroke.   She has had an MRI of the brain and a 2D echo performed today; results pending.  The patient has received IV Keppra and ativan.  She is now on Valproate 500  Mg IV q12h.   She has received three PRN doses of Hydralazine IV for hypertension.  She has also received three prn doses of ativan .  Speech therapy reports patient is high risk for aspiration.    [facility] will continue to follow patient until discharge.  Please call with any questions or concerns.  Mickie Kay, [facility] Liaison  [phone]

## 2016-06-24 NOTE — Progress Notes (Signed)
SLP Cancellation Note  Patient Details Name: [name] MRN: [id number] DOB: 04/26/1951   Cancelled treatment:       Reason Eval/Treat Not Completed: Patient at procedure or test/unavailable. Pt currently undergoing EEG. Will continue efforts. RN aware.  Shonna Chock 06/24/2016, 9:07 AM  Enriqueta Shutter. Quentin Ore [facility], [place] [phone]

## 2016-06-25 ENCOUNTER — Inpatient Hospital Stay (HOSPITAL_COMMUNITY)

## 2016-06-25 DIAGNOSIS — I3139 Other pericardial effusion (noninflammatory): Secondary | ICD-10-CM | POA: Diagnosis present

## 2016-06-25 DIAGNOSIS — I1310 Hypertensive heart and chronic kidney disease without heart failure, with stage 1 through stage 4 chronic kidney disease, or unspecified chronic kidney disease: Secondary | ICD-10-CM

## 2016-06-25 DIAGNOSIS — I5042 Chronic combined systolic (congestive) and diastolic (congestive) heart failure: Secondary | ICD-10-CM

## 2016-06-25 DIAGNOSIS — N189 Chronic kidney disease, unspecified: Secondary | ICD-10-CM | POA: Diagnosis not present

## 2016-06-25 DIAGNOSIS — N179 Acute kidney failure, unspecified: Secondary | ICD-10-CM

## 2016-06-25 DIAGNOSIS — I480 Paroxysmal atrial fibrillation: Secondary | ICD-10-CM

## 2016-06-25 DIAGNOSIS — I319 Disease of pericardium, unspecified: Secondary | ICD-10-CM

## 2016-06-25 DIAGNOSIS — I471 Supraventricular tachycardia: Secondary | ICD-10-CM

## 2016-06-25 DIAGNOSIS — I6783 Posterior reversible encephalopathy syndrome: Secondary | ICD-10-CM | POA: Diagnosis present

## 2016-06-25 DIAGNOSIS — I313 Pericardial effusion (noninflammatory): Secondary | ICD-10-CM | POA: Diagnosis present

## 2016-06-25 LAB — URINALYSIS, ROUTINE W REFLEX MICROSCOPIC
Glucose, UA: NEGATIVE mg/dL
HGB URINE DIPSTICK: NEGATIVE
Ketones, ur: 15 mg/dL — AB
Nitrite: NEGATIVE
SPECIFIC GRAVITY, URINE: 1.018 (ref 1.005–1.030)
pH: 5 (ref 5.0–8.0)

## 2016-06-25 LAB — CBC
HEMATOCRIT: 21.4 % — AB (ref 36.0–46.0)
HEMATOCRIT: 21.4 % — AB (ref 36.0–46.0)
HEMOGLOBIN: 6.6 g/dL — AB (ref 12.0–15.0)
HEMOGLOBIN: 6.5 g/dL — AB (ref 12.0–15.0)
MCH: 28.6 pg (ref 26.0–34.0)
MCH: 28.4 pg (ref 26.0–34.0)
MCHC: 30.8 g/dL (ref 30.0–36.0)
MCHC: 30.4 g/dL (ref 30.0–36.0)
MCV: 92.6 fL (ref 78.0–100.0)
MCV: 93.4 fL (ref 78.0–100.0)
Platelets: 159 10*3/uL (ref 150–400)
Platelets: 166 10*3/uL (ref 150–400)
RBC: 2.31 MIL/uL — ABNORMAL LOW (ref 3.87–5.11)
RBC: 2.29 MIL/uL — AB (ref 3.87–5.11)
RDW: 15.5 % (ref 11.5–15.5)
RDW: 15.3 % (ref 11.5–15.5)
WBC: 9.2 10*3/uL (ref 4.0–10.5)
WBC: 8.5 10*3/uL (ref 4.0–10.5)

## 2016-06-25 LAB — VALPROIC ACID LEVEL
Valproic Acid Lvl: 32 ug/mL — ABNORMAL LOW (ref 50.0–100.0)
Valproic Acid Lvl: 29 ug/mL — ABNORMAL LOW (ref 50.0–100.0)

## 2016-06-25 LAB — BASIC METABOLIC PANEL
ANION GAP: 10 (ref 5–15)
Anion gap: 8 (ref 5–15)
BUN: 39 mg/dL — ABNORMAL HIGH (ref 6–20)
BUN: 38 mg/dL — ABNORMAL HIGH (ref 6–20)
CALCIUM: 8.1 mg/dL — AB (ref 8.9–10.3)
CHLORIDE: 115 mmol/L — AB (ref 101–111)
CHLORIDE: 116 mmol/L — AB (ref 101–111)
CO2: 17 mmol/L — AB (ref 22–32)
CO2: 20 mmol/L — AB (ref 22–32)
CREATININE: 2.85 mg/dL — AB (ref 0.44–1.00)
Calcium: 8 mg/dL — ABNORMAL LOW (ref 8.9–10.3)
Creatinine, Ser: 2.81 mg/dL — ABNORMAL HIGH (ref 0.44–1.00)
GFR calc non Af Amer: 16 mL/min — ABNORMAL LOW (ref >60)
GFR calc non Af Amer: 16 mL/min — ABNORMAL LOW (ref >60)
GFR, EST AFRICAN AMERICAN: 19 mL/min — AB (ref >60)
GFR, EST AFRICAN AMERICAN: 19 mL/min — AB (ref >60)
GLUCOSE: 62 mg/dL — AB (ref 65–99)
Glucose, Bld: 69 mg/dL (ref 65–99)
POTASSIUM: 4.8 mmol/L (ref 3.5–5.1)
Potassium: 5.2 mmol/L — ABNORMAL HIGH (ref 3.5–5.1)
SODIUM: 144 mmol/L (ref 135–145)
Sodium: 142 mmol/L (ref 135–145)

## 2016-06-25 LAB — URINE MICROSCOPIC-ADD ON

## 2016-06-25 LAB — HEMOGLOBIN AND HEMATOCRIT, BLOOD
HEMATOCRIT: 21.4 % — AB (ref 36.0–46.0)
HEMOGLOBIN: 6.5 g/dL — AB (ref 12.0–15.0)

## 2016-06-25 LAB — POTASSIUM: Potassium: 4.7 mmol/L (ref 3.5–5.1)

## 2016-06-25 LAB — SODIUM, URINE, RANDOM: SODIUM UR: 90 mmol/L

## 2016-06-25 LAB — CREATININE, URINE, RANDOM: CREATININE, URINE: 36.1 mg/dL

## 2016-06-25 LAB — PREPARE RBC (CROSSMATCH)

## 2016-06-25 MED ORDER — SODIUM CHLORIDE 0.9 % IV SOLN: Freq: Once | INTRAVENOUS | Status: DC

## 2016-06-25 MED ORDER — VALPROATE SODIUM 500 MG/5ML IV SOLN
500.0000 mg | Freq: Once | INTRAVENOUS | Status: AC
  Administered 2016-06-25: 500 mg via INTRAVENOUS
  Filled 2016-06-25: qty 5

## 2016-06-25 MED ORDER — FAMOTIDINE IN NACL 20-0.9 MG/50ML-% IV SOLN: 20.0000 mg | INTRAVENOUS | Status: DC

## 2016-06-25 MED ORDER — SODIUM CHLORIDE 0.9 % IV SOLN
INTRAVENOUS | Status: DC
  Administered 2016-06-25: 11:00:00 via INTRAVENOUS

## 2016-06-25 MED ORDER — ACETAMINOPHEN 325 MG RE SUPP
325.0000 mg | RECTAL | Status: DC | PRN
  Administered 2016-06-25 – 2016-06-28 (×2): 325 mg via RECTAL
  Filled 2016-06-25 (×2): qty 1

## 2016-06-25 MED ORDER — METOPROLOL TARTRATE 5 MG/5ML IV SOLN: 5.0000 mg | Freq: Once | INTRAVENOUS | Status: AC

## 2016-06-25 MED ORDER — FUROSEMIDE 10 MG/ML IJ SOLN
20.0000 mg | Freq: Once | INTRAMUSCULAR | Status: AC
  Administered 2016-06-25: 20 mg via INTRAVENOUS
  Filled 2016-06-25: qty 2

## 2016-06-25 MED ORDER — DEXTROSE 5 % IV SOLN
500.0000 mg | Freq: Three times a day (TID) | INTRAVENOUS | Status: DC
  Administered 2016-06-25 – 2016-06-26 (×3): 500 mg via INTRAVENOUS
  Filled 2016-06-25 (×5): qty 5

## 2016-06-25 MED ORDER — DIPHENHYDRAMINE HCL 50 MG/ML IJ SOLN
25.0000 mg | Freq: Once | INTRAMUSCULAR | Status: AC
  Administered 2016-06-25: 25 mg via INTRAVENOUS
  Filled 2016-06-25: qty 1

## 2016-06-25 NOTE — Progress Notes (Signed)
Subjective: Looks better today than yesterday  Exam: Filed Vitals:   06/25/16 0304 06/25/16 0743  BP: 135/38 158/48  Pulse: 62 68  Temp: 98.3 F (36.8 C) 98 F (36.7 C)  Resp: 16 14   Gen: In bed, NAD Resp: non-labored breathing, no acute distress Abd: soft, nt  Neuro: MS: Awake, able to tell me more today, but still with some perseveration. Able to follow commands.  CN: She is able to cross midline bilaterally.  Motor: Appears to move the right side more than the left, but does not cooperate for formal testing Sensory: Responds to noxious stimuli in all 4 extremities  Pertinent Labs: AKI  Impression: 65 year old female with left-sided weakness and right-sided PLEDs following prolonged seizure. MRI could be indicative of prolonged status, but also possible and I think more likley is PRES for which she is cerrtainly at risk given fluctuating BP, renal failure.   Recommendations: 1) continue Keppra, vimpat 2) increase VPA to 500mg  Q8H 3) D/C EEG and obtain MRI brain  Roland Rack, MD [facility] [phone]  If 7pm- 7am, please page neurology on call as listed in [place].

## 2016-06-25 NOTE — Care Management Important Message (Signed)
Important Message  Patient Details  Name: [name] MRN: [id number] Date of Birth: 07/27/1951   Medicare Important Message Given:  Yes    Nathen May 06/25/2016, 2:03 PM

## 2016-06-25 NOTE — Progress Notes (Signed)
Speech Language Pathology Treatment: Dysphagia  Patient Details Name: SUNDI KUEHL MRN: [id number] DOB: [birth year] Today's Date: 06/25/2016 Time: [id number] SLP Time Calculation (min) (ACUTE ONLY): 25 min  Assessment / Plan / Recommendation Clinical Impression  Dysphagia treatment provided today for PO readiness. Pt more alert and responsive today, able to follow 1-step commands during oral care and PO trials. Answering yes/ no questions and simple biographical questions with some perseveration noted. Pt tolerated trials of ice chips, puree, and nectar-thick liquids without overt s/s of aspiration noted. Pt with good attention to bolus, swallow subjectively appeared timely, clear vocal quality following trials. Trial of thin liquid elicited a delayed cough x1. Given these improvements, recommend initiating dysphagia 1 diet, nectar-thick liquids, meds crushed in puree, full supervision to cue pt for small bites/ sips, assist with feeding, only feed if alert. RN reports that alertness level has varied this a.m. Will continue to follow closely for diet tolerance.    HPI HPI: KATRICIA SADLON is a 65 y.o. 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 She's been living in the nursing home for 3 years now and is full assist for all ADLs, w/ recent dx in 1/17 of seizure disorder, with PMHx of atrial fibrillation, history of hypertension, chronic 2 L n/c at NH, and chronic kidney disease, presents to our hospital for seizure. Chest x ray reveals Mild asymmetric increased opacification within the right upper lobe and right midlung which may reflect underlying aspiration,      SLP Plan  Goals updated     Recommendations  Diet recommendations: Dysphagia 1 (puree);Nectar-thick liquid Liquids provided via: Straw Medication Administration: Crushed with puree Supervision: Staff to assist with self feeding;Full supervision/cueing for compensatory strategies Compensations:  Slow rate;Small sips/bites;Other (Comment) (check for oral holding or residuals) Postural Changes and/or Swallow Maneuvers: Seated upright 90 degrees             Oral Care Recommendations: Oral care BID Follow up Recommendations: Other (comment) (TBD) Plan: Goals updated     Seba Dalkai, Amy K, MA, CCC-SLP 06/25/2016, 10:00 AM [phone]

## 2016-06-25 NOTE — Care Management Note (Signed)
Case Management Note  Patient Details  Name: [name] MRN: [id number] Date of Birth: [birth year]  Subjective/Objective:    Patient is from [place] with [place].  Presents with seizure, CSW aware and following, patient will return to snf at dc.                  Action/Plan:   Expected Discharge Date:                  Expected Discharge Plan:  Skilled Nursing Facility  In-House Referral:  Clinical Social Work  Discharge planning Services  CM Consult  Post Acute Care Choice:    Choice offered to:     DME Arranged:    DME Agency:     HH Arranged:    [place] Agency:     Status of Service:  In process, will continue to follow  If discussed at Long Length of Stay Meetings, dates discussed:    Additional Comments:  [name], RN 06/25/2016, 7:56 PM

## 2016-06-25 NOTE — Progress Notes (Signed)
PROGRESS NOTE    [name]  [id number] DOB: 04/14/1951 DOA: 06/22/2016 PCP: Kandice Hams, MD    Brief Narrative:  Patient is a 65 year old female bedbound for the past 3 years nursing home resident with history of severe debility, atrial fibrillation, hypertension, chronic kidney disease stage III, obstructive sleep apnea, CHF, morbid obesity, history of seizures presented to the ED with status epilepticus. Patient initially loaded with Keppra and placed on IV Keppra twice daily. Patient had also presented with shortness of breath and tachypnea. Due to patient's mobility status, presentation was shortness of breath and tachypnea concern was for PE and a such VQ scan was ordered. On route to VQ scan patient noted to have another episode of seizures and had to be loaded with IV Depakote and IV Keppra and doses of Keppra increased and patient placed on Depakote as well as Vimpat. Patient also on heparin drip. Patient opens eyes to verbal stimuli however not following commands and nonresponsive which her husband is not her baseline. Patient also noted on the night of 06/22/2016 went into SVT requiring adenosine and now on scheduled IV Lopressor. Neurology and cardiology have been consulted. Patient underwent EEG which was consistent with status epilepticus which improved daily. Patient subsequently underwent MRI of the head with findings consistent with status epilepticus versus PRES. goal blood pressure systolic less than [id number]. 2-D echo done did show a mild to moderate pericardial effusion per cardiology.   Assessment & Plan:   Principal Problem:   Status epilepticus ([place]) Active Problems:   PRES (posterior reversible encephalopathy syndrome): Possible   PAF (paroxysmal atrial fibrillation) (HCC)   Chronic combined systolic and diastolic CHF (congestive heart failure) (HCC)   SOB (shortness of breath)   Total self-care deficit   AKI (acute kidney injury) ([place])   CKD (chronic kidney  disease) stage 3, GFR 30-59 ml/min   Malignant HTN with heart disease, w/o CHF, with chronic kidney disease   SVT (supraventricular tachycardia) (HCC)   Hematoma of left lower extremity   Pericardial effusion   Acute kidney injury superimposed on CKD ([place])  #1 status epilepticus VS PRES Patient presented with generalized tonic-clonic seizures after episode of confusion and disorientation. Patient was given a loading dose of Keppra and placed on Keppra 750 mg twice daily. CT head which was done showed prior bilateral occipital ischemic changes. There was also concern that patient may have had arrhythmias which may have led to decreased cerebral perfusion provoking seizures secondary to bilateral occipital ischemic changes noted. The night of 06/22/2016, patient was noted to have episode of seizures on route to VQ scan and received Ativan at that time. Neurology was called and patient was loaded with 1 g of IV Depakote followed by 500 mg of IV Depakote twice daily as well as IV Keppra and Keppra dose increased to 1 g IV twice daily. Patient was also given IV Vimpat and placed on 100 mg of Vimpat twice daily. Patient with no signs of infectious etiology. Patient with low-grade fever this morning and also leukocytosis however these may have likely been secondary to seizure episodes. Leukocytosis trending down. EEG consistent with status epilepticus. Patient more alert today and following some commands. Concern for possible acute ischemic CVA and as such MRI of the brain was done 06/24/2016 that showed extensive multifocal T2/FLAIR signal abnormality involving bilateral cerebral hemispheres as well as cerebellum with associated patchy diffusion abnormality likely related to status epilepticus and/or. PRES. No acute vascular infarct.remote bilateral PCA territory infarcts with remote  left pontine lacunar infarct. Underlying mild chronic small vessel ischemic disease. Patient with ruptured/bleeding hematoma the  morning of 06/24/2016 and as such anticoagulation was discontinued. Valproic acid level decreased at 29. Patient with prior history of GI bleed and hematoma and a such on NO anticoagulation for A. fib. Heparin has been discontinued.  Continue IV Lopressor, resume home regimen of Norvasc and hydralazine and Isordil which have been held due to nothing by mouth status. Goal blood pressure systolic less than [id number]. Continue Vimpat, IV Keppra, IV Depakote. Per neurology.  #2 SVT Patient noted to go into SVT the night of 06/22/2016, and had to be given adenosine 6 mg IV 1 with improvement and subsequently placed on scheduled Lopressor.TSH within normal limits at 1.301. 2-D echo with EF of 50-55% with grade 1 diastolic dysfunction and mild to moderate pericardial effusion with no signs of tamponade. Continue scheduled IV Lopressor. Cardiology following and appreciate input and recommendations.   #3 mild to moderate pericardial effusion Per 2-D echo. No signs of tamponade. Monitor. Per cardiology.  #4 paroxysmal atrial fibrillation Currently rate controlled on scheduled IV Lopressor. Patient following some commands. Speech therapy to reevaluate prior to starting oral medications. Patient NOT on anticoagulation in the past secondary to history of GI bleed and hematoma. Discontinued IV heparin due to bleeding hematoma noted this morning.   #5 shortness of breath/tachypnea Patient presented with shortness of breath and tachypnea with a history of being bedbound for approximately 3 years. Concern for pulmonary emboli. VQ scan was ordered however on route to VQ scan patient had seizures and a such VQ scan is still pending. D/C IV heparin drip. Repeat chest x-ray 06/23/2016 with no significant change. Follow.  #6 malignant hypertension in the setting of chronic kidney disease Patient currently following some commands. Continue scheduled IV Lopressor. Hydralazine as needed. Speech therapy has reevaluate as patient  continues to improve for swallowing. Will resume patient's home regimen of Norvasc, hydralazine, Isordil as these were being held due to nothing by mouth status. The patient is placed on the diet medications will need to be given for better blood pressure control. Goal systolic blood pressure less than 140. Hydralazine when necessary.  #7 acute on chronic kidney disease stage III Likely secondary to prerenal azotemia. Patient noted to have acute blood loss secondary to hematoma with hemoglobin at 6.5. Check a UA with cultures and sensitivities. Check a fractional excretion of sodium. Patient being transfused 2 units packed red blood cells. Check a renal ultrasound. Gentle hydration. Follow.  #8 leukocytosis Likely secondary to acute seizures. WBC trended down. Patient satting 100% on 3 L nasal cannula. Repeat chest x-ray with no significant new infiltrates noted. Patient with no respiratory symptoms. Urinalysis was nitrite negative leukocytes negative. Patient with low-grade temp of 100.1. No need for antibiotics at this time.  #9 combined chronic systolic and diastolic heart failure Currently stable.  #10 left lower extremity bleeding hematoma Patient noted to have a bleeding/ruptured hematoma the morning of 06/24/2016 and pressure dressing in place. No further bleeding per nursing. Discontinued IV heparin.  #11 ABLA Likely secondary to ruptured LLE hematoma. HGB 6.5 from 9.7. Will transfuse 2 units PRBCs.  #12 prognosis Patient has been bedridden for the past 3 years with comorbidities admitted with status epilepticus. Patient also noted to go into SVT during the hospitalization. Patient had MRI done with findings concerning for status epilepticus versus PRES. patient indeed has PRES will need tighter blood pressure control with goal systolic blood pressure less than  140. Patient was on hospice prior to admission however was a full code. Discussed with patient's husband in terms of prognosis and  depending on neurology's evaluation will see how patient does over the next few days and if no significant improvement or deterioration may consider palliative care consult for goals of care. If patient indeed has PRES will likely take several weeks to see full recovery and in that case could have palliative care goals of care done in the outpatient setting.    DVT prophylaxis: SCD Code Status: Full Family Communication: Updated husband at bedside. Disposition Plan: Remain the step down unit. Back to nursing facility when medically stable.   Consultants:   Neurology: Dr. Tasia Catchings 06/22/2016  Cardiology: Dr. Meda Coffee 06/23/2016  Procedures:  EEG 06/23/2016  CT head 06/22/2016  Chest x-ray 06/22/2016, 06/23/2016  MRI 06/24/2016  2 d echo 06/24/2016  2 units PRBC pending  Antimicrobials:   None   Subjective: Patient laying in bed, alert to self only. Keeps repeating [place], [place]. Per nursing no overt bleeding. Patient denies CP, no SOB.  Objective: Filed Vitals:   06/24/16 1955 06/24/16 2241 06/25/16 0304 06/25/16 0743  BP: 143/84 150/42 135/38 158/48  Pulse: 87 80 62 68  Temp: 99.1 F (37.3 C) 97.9 F (36.6 C) 98.3 F (36.8 C) 98 F (36.7 C)  TempSrc: Axillary Axillary Axillary Oral  Resp: 11 19 16 14   Height:      Weight:      SpO2: 100% 100% 100% 100%    Intake/Output Summary (Last 24 hours) at 06/25/16 1037 Last data filed at 06/25/16 0600  Gross per 24 hour  Intake   2285 ml  Output      0 ml  Net   2285 ml   Filed Weights   06/22/16 1331 06/22/16 1931  Weight: 101.606 kg (224 lb) 74.3 kg (163 lb 12.8 oz)    Examination:  General exam: Eyes open. Following some commands.  Respiratory system: Clear to auscultation anterior lung fields. Respiratory effort normal. Cardiovascular system: Irregularly irregular. No JVD, murmurs, rubs, gallops or clicks. No pedal edema. Gastrointestinal system: Abdomen is nondistended, soft and nontender. No  organomegaly or masses felt. Normal bowel sounds heard. Central nervous system: Alert and oriented to self only. Extremities: 3-4/5 RUE strength, 2/5 LUE strength Skin: No rashes, lesions or ulcers. Left lower extremity with pressure dressing status post ruptured hematoma. Psychiatry: Judgement and insight appear poor. Mood & affect flat.     Data Reviewed: I have personally reviewed following labs and imaging studies  CBC:  Recent Labs Lab 06/22/16 1240  06/23/16 0300 06/24/16 0509 06/25/16 0442 06/25/16 0737 06/25/16 0828  WBC 12.1*  --  17.9* 10.4 9.2 8.5  --   NEUTROABS 9.6*  --   --   --   --   --   --   HGB 10.2*  < > 10.4* 9.7* 6.6* 6.5* 6.5*  HCT 32.9*  < > 33.3* 31.5* 21.4* 21.4* 21.4*  MCV 92.9  --  91.5 91.3 92.6 93.4  --   PLT 231  --  207 218 159 166  --   < > = values in this interval not displayed. Basic Metabolic Panel:  Recent Labs Lab 06/22/16 1240 06/22/16 1255 06/23/16 0300 06/24/16 0509 06/25/16 0442 06/25/16 0737 06/25/16 0828  NA 142 145 143 144 142 144  --   K 3.4* 3.4* 4.2 3.5 5.2* 4.8 4.7  CL 113* 111 114* 115* 115* 116*  --   CO2  20*  --  18* 22 17* 20*  --   GLUCOSE 185* 185* 88 94 62* 69  --   BUN 38* 45* 36* 31* 39* 38*  --   CREATININE 1.63* 1.70* 1.72* 1.93* 2.81* 2.85*  --   CALCIUM 8.5*  --  8.3* 8.3* 8.1* 8.0*  --   MG  --   --  1.9 1.9  --   --   --   PHOS  --   --  3.6  --   --   --   --    GFR: Estimated Creatinine Clearance: 18.8 mL/min (by C-G formula based on Cr of 2.85). Liver Function Tests:  Recent Labs Lab 06/22/16 1240 06/23/16 0300 06/24/16 0509  AST 23 20 15   ALT 9* 9* 7*  ALKPHOS 106 94 86  BILITOT 0.7 0.6 0.7  PROT 6.3* 5.7* 5.5*  ALBUMIN 2.8* 2.3* 2.1*   No results for input(s): LIPASE, AMYLASE in the last 168 hours. No results for input(s): AMMONIA in the last 168 hours. Coagulation Profile:  Recent Labs Lab 06/22/16 1240  INR 0.94   Cardiac Enzymes: No results for input(s): CKTOTAL, CKMB,  CKMBINDEX, TROPONINI in the last 168 hours. BNP (last 3 results) No results for input(s): PROBNP in the last 8760 hours. HbA1C: No results for input(s): HGBA1C in the last 72 hours. CBG: No results for input(s): GLUCAP in the last 168 hours. Lipid Profile: No results for input(s): CHOL, HDL, LDLCALC, TRIG, CHOLHDL, LDLDIRECT in the last 72 hours. Thyroid Function Tests:  Recent Labs  06/24/16 0509  TSH 1.301   Anemia Panel: No results for input(s): VITAMINB12, FOLATE, FERRITIN, TIBC, IRON, RETICCTPCT in the last 72 hours. Sepsis Labs:  Recent Labs Lab 06/22/16 1255  LATICACIDVEN 1.44    Recent Results (from the past 240 hour(s))  Blood Culture (routine x 2)     Status: None (Preliminary result)   Collection Time: 06/22/16  2:01 PM  Result Value Ref Range Status   Specimen Description BLOOD LEFT WRIST  Final   Special Requests BOTTLES DRAWN AEROBIC ONLY 5CC  Final   Culture  Setup Time   Final    GRAM POSITIVE RODS AEROBIC BOTTLE ONLY CRITICAL VALUE NOTED.  VALUE IS CONSISTENT WITH PREVIOUSLY REPORTED AND CALLED VALUE.    Culture GRAM POSITIVE RODS  Final   Report Status PENDING  Incomplete  Blood Culture (routine x 2)     Status: None (Preliminary result)   Collection Time: 06/22/16  2:05 PM  Result Value Ref Range Status   Specimen Description BLOOD LEFT HAND  Final   Special Requests BOTTLES DRAWN AEROBIC ONLY 5CC  Final   Culture  Setup Time   Final    GRAM POSITIVE RODS AEROBIC BOTTLE ONLY CRITICAL RESULT CALLED TO, READ BACK BY AND VERIFIED WITH: M TURNER,PHARMD AT 1522 06/24/16 BY L BENFIELD    Culture GRAM POSITIVE RODS  Final   Report Status PENDING  Incomplete  Blood Culture ID Panel (Reflexed)     Status: None   Collection Time: 06/22/16  2:05 PM  Result Value Ref Range Status   Enterococcus species NOT DETECTED NOT DETECTED Final   Vancomycin resistance NOT DETECTED NOT DETECTED Final   Listeria monocytogenes NOT DETECTED NOT DETECTED Final    Staphylococcus species NOT DETECTED NOT DETECTED Final   Staphylococcus aureus NOT DETECTED NOT DETECTED Final   Methicillin resistance NOT DETECTED NOT DETECTED Final   Streptococcus species NOT DETECTED NOT DETECTED Final  Streptococcus agalactiae NOT DETECTED NOT DETECTED Final   Streptococcus pneumoniae NOT DETECTED NOT DETECTED Final   Streptococcus pyogenes NOT DETECTED NOT DETECTED Final   Acinetobacter baumannii NOT DETECTED NOT DETECTED Final   Enterobacteriaceae species NOT DETECTED NOT DETECTED Final   Enterobacter cloacae complex NOT DETECTED NOT DETECTED Final   Escherichia coli NOT DETECTED NOT DETECTED Final   Klebsiella oxytoca NOT DETECTED NOT DETECTED Final   Klebsiella pneumoniae NOT DETECTED NOT DETECTED Final   Proteus species NOT DETECTED NOT DETECTED Final   Serratia marcescens NOT DETECTED NOT DETECTED Final   Carbapenem resistance NOT DETECTED NOT DETECTED Final   Haemophilus influenzae NOT DETECTED NOT DETECTED Final   Neisseria meningitidis NOT DETECTED NOT DETECTED Final   Pseudomonas aeruginosa NOT DETECTED NOT DETECTED Final   Candida albicans NOT DETECTED NOT DETECTED Final   Candida glabrata NOT DETECTED NOT DETECTED Final   Candida krusei NOT DETECTED NOT DETECTED Final   Candida parapsilosis NOT DETECTED NOT DETECTED Final   Candida tropicalis NOT DETECTED NOT DETECTED Final  Urine culture     Status: Abnormal   Collection Time: 06/22/16  3:52 PM  Result Value Ref Range Status   Specimen Description URINE, CATHETERIZED  Final   Special Requests NONE  Final   Culture MULTIPLE SPECIES PRESENT, SUGGEST RECOLLECTION (A)  Final   Report Status 06/23/2016 FINAL  Final  MRSA PCR Screening     Status: None   Collection Time: 06/23/16  1:03 AM  Result Value Ref Range Status   MRSA by PCR NEGATIVE NEGATIVE Final    Comment:        The GeneXpert MRSA Assay (FDA approved for NASAL specimens only), is one component of a comprehensive MRSA  colonization surveillance program. It is not intended to diagnose MRSA infection nor to guide or monitor treatment for MRSA infections.          Radiology Studies: Mr Brain Wo Contrast  06/24/2016  CLINICAL DATA:  Initial evaluation for left-sided weakness and right-sidedPLEDs status post prolonged seizure. EXAM: MRI HEAD WITHOUT CONTRAST TECHNIQUE: Multiplanar, multiecho pulse sequences of the brain and surrounding structures were obtained without intravenous contrast. COMPARISON:  Prior CT from 06/22/2016 as well as previous MRI from 07/24/2007. FINDINGS: Diffuse prominence of the CSF containing spaces is compatible with generalized cerebral atrophy, somewhat advanced for patient age. Encephalomalacia within the right temporal occipital region consistent with remote right PCA territory infarct. Small area of encephalomalacia within the parasagittal left occipital lobe consistent with remote left PCA territory infarct. Remote lacunar infarct within the left paramedian pons. There is extensive multi focal abnormal T2/FLAIR signal abnormality involving the bilateral cerebral hemispheres, left greater than right. This predominantly involves the deep and subcortical white matter, with probable some area tip patchy gray matter involvement. Frontal, parietal,, occipital, and temporal lobes affected. There is abnormal T2/FLAIR signal intensity within the bilateral cerebellar hemispheres. Abnormal signal intensity extends along the posterior limb of the left internal capsule. Otherwise relative sparing of the deep gray nuclei and brainstem. There is scattered patchy areas of restricted diffusion within knee many of these areas, particularly near the vertex (series 5, image 37). Additional subtle diffusion abnormality within the bilateral cerebellar hemispheres. Major intracranial vascular flow voids are preserved. No acute intracranial hemorrhage. No mass lesion, midline shift, or mass effect. No  hydrocephalus. No extra-axial fluid collection. Major dural sinuses are grossly patent. Craniocervical junction within normal limits. Degenerative spondylolysis at C3-4 with resultant moderate canal stenosis. Pituitary gland normal. No acute  abnormality about the globes and orbits. Patient is status post lens extraction bilaterally. Scattered mucosal thickening throughout the ethmoidal air cells and maxillary sinuses. No air-fluid level to suggest active sinus infection. Trace opacity bilateral mastoid air cells. Inner ear structures grossly normal. Bone marrow signal intensity within normal limits. No scalp soft tissue abnormality. Findings favored to reflect PRES given the history of seizure. Superimposed changes related to the prolonged seizure may also be present. No evidence for acute vascular infarct. IMPRESSION: 1. Extensive multifocal T2/FLAIR signal abnormality involving the bilateral cerebral hemispheres as well as the cerebellum as above with associated patchy diffusion abnormality. Given the provided history, primary differential considerations include changes related to status epilepticus and/or PRES. 2. No acute vascular infarct identified. 3. Remote bilateral PCA territory infarcts with remote left pontine lacunar infarct. 4. Underlying mild chronic small vessel ischemic disease. Electronically Signed   By: Jeannine Boga M.D.   On: 06/24/2016 23:57        Scheduled Meds: . sodium chloride   Intravenous Once  . allopurinol  100 mg Oral Daily  . amLODipine  10 mg Oral QHS  . atorvastatin  20 mg Oral QHS  . diphenhydrAMINE  25 mg Intravenous Once  . famotidine (PEPCID) IV  20 mg Intravenous Q12H  . furosemide  20 mg Intravenous Once  . hydrALAZINE  100 mg Oral Q8H  . isosorbide dinitrate  30 mg Oral BID  . lacosamide (VIMPAT) IV  100 mg Intravenous Q12H  . levETIRAcetam  1,000 mg Intravenous Q12H  . metoCLOPramide (REGLAN) injection  5 mg Intravenous Q8H  . metoprolol  5 mg  Intravenous Q6H  . polyethylene glycol  17 g Oral BID  . sodium chloride flush  3 mL Intravenous Q12H  . traZODone  50 mg Oral QHS  . valproate sodium  500 mg Intravenous Q8H   Continuous Infusions: . sodium chloride 50 mL/hr at 06/23/16 1700     LOS: 2 days    Time spent: [place], MD [facility] Pager [phone]  If 7PM-7AM, please contact night-coverage [email or web address] Password [facility] 06/25/2016, 10:37 AM

## 2016-06-25 NOTE — Progress Notes (Signed)
Initial Nutrition Assessment  DOCUMENTATION CODES:   Non-severe (moderate) malnutrition in context of chronic illness  INTERVENTION:    Mighty Shake II BID with meals, each supplement provides 480-500 kcals and 20-23 grams of protein  NUTRITION DIAGNOSIS:   Inadequate oral intake related to dysphagia, lethargy/confusion as evidenced by per patient/family report.  GOAL:   Patient will meet greater than or equal to 90% of their needs  MONITOR:   PO intake, Supplement acceptance, Labs, Weight trends, I & O's  REASON FOR ASSESSMENT:   Low [place]    ASSESSMENT:   65 y.o. female with history of long bedridden state due to severe debility. She's been living in a nursing facility for 3 years now and is full assist for all ADLs, w/ recent dx in 1/17 of seizure disorder. PMHx includes atrial fibrillation, hypertension, chronic 2 L n/c at NH, and chronic kidney disease. Presented to [facility] on 7/15 with seizure.Brought in by EMS and enroute pt had a tonic-clonic seizure.  Patient's husband reports that patient has lost a lot of weight since being admitted to the nursing facility 3 years ago. PTA she was on a regular consistency diet with chopped meats and thin liquids. Husband reports fair to good intake PTA.  Per review of usual weights, patient has lost 27% of her usual weight in the past 6 months. Nutrition-Focused physical exam completed. Findings are no fat depletion, mild-moderate muscle depletion, and mild edema.  Patient with moderate PCM.  Diet Order:  DIET - DYS 1 Room service appropriate?: Yes; Fluid consistency:: Nectar Thick  Skin:  Reviewed, no issues  Last BM:  7/17  Height:   Ht Readings from Last 1 Encounters:  06/22/16 5\' 3"  (1.6 m)    Weight:   Wt Readings from Last 1 Encounters:  06/22/16 163 lb 12.8 oz (74.3 kg)    Ideal Body Weight:  52.3 kg  BMI:  Body mass index is 29.02 kg/(m^2).  Estimated Nutritional Needs:   Kcal:  1700-1900  Protein:   95-110 gm  Fluid:  1.8-2 L  EDUCATION NEEDS:   No education needs identified at this time   Molli Barrows, [place], [place], [place] Pager [phone] After Hours Pager [phone]

## 2016-06-25 NOTE — Progress Notes (Signed)
CRITICAL VALUE ALERT  Critical value received:  Hg 6.5 (repeat to confirm previous hg 6.6 physician aware and has already ordered 2 Units PRBC's.  Blood bank states the patient has rare antibodies and there will be a delay in receiving blood.    Date of notification:  06/25/2016  Time of notification:  0845  Critical value read back:yes  Nurse who received alert:  Austin Miles  MD notified (1st page):  Dr. Grandville Silos  Time of first page:  [phone]  MD notified (2nd page):  Time of second page:  Responding MD:  [phone]  Time MD responded:  Dr. Grandville Silos responded in person at patients bedside.

## 2016-06-25 NOTE — NC FL2 (Signed)
[place] LEVEL OF CARE SCREENING TOOL     IDENTIFICATION  Patient Name: [name] Birthdate: [birth year] Sex: female Admission Date (Current Location): 06/22/2016  [facility] and [place] Number:  Herbalist and Address:  [facility], [place] [address] [place], [place] [address]      Provider Number: [id number]  Attending Physician Name and Address:  Eugenie Filler, MD  Relative Name and Phone Number:       Current Level of Care: Hospital Recommended Level of Care: [place] Prior Approval Number:    Date Approved/Denied:   PASRR Number: [id number]  Discharge Plan: SNF    Current Diagnoses: Patient Active Problem List   Diagnosis Date Noted  . Pericardial effusion 06/25/2016  . Acute kidney injury superimposed on CKD ([place]) 06/25/2016  . PRES (posterior reversible encephalopathy syndrome): Possible 06/25/2016  . Hematoma of left lower extremity 06/24/2016  . SVT (supraventricular tachycardia) ([place]) 06/23/2016  . Status epilepticus ([place]) 06/22/2016  . Total self-care deficit 06/22/2016  . AKI (acute kidney injury) ([place]) 06/22/2016  . CKD (chronic kidney disease) stage 3, GFR 30-59 ml/min 06/22/2016  . Malignant HTN with heart disease, w/o CHF, with chronic kidney disease 06/22/2016  . Encounter for palliative care   . Goals of care, counseling/discussion   . HCAP (healthcare-associated pneumonia) 01/03/2016  . Seizure ([place]) 01/03/2016  . Aspiration pneumonia ([place]) 01/03/2016  . Essential hypertension 01/03/2016  . End of life care 01/03/2016  . Hyperglycemia 01/03/2016  . CKD (chronic kidney disease) 01/03/2016  . Immobility 12/14/2014  . Chronic airway obstruction, not elsewhere classified 09/07/2014  . Anemia in chronic kidney disease(285.21) 08/31/2014  . Candidiasis of skin 06/23/2014  . Congestive heart disease ([place]) 06/21/2014  . Type 2 diabetes mellitus with other diabetic kidney  complication ([place]) 123XX123  . Insomnia 05/16/2014  . Gastroparesis 04/22/2014  . Anxiety state, unspecified 04/22/2014  . Obesity hypoventilation syndrome ([place]) 02/05/2014  . Chronic diastolic CHF (congestive heart failure) ([place]) 02/05/2014  . Acute systolic heart failure ([place]) 01/26/2014  . Hypernatremia 12/10/2013  . A-fib ([place]) 12/03/2013  . Chronic anemia 11/17/2013  . Gout 11/17/2013  . Obesity 11/17/2013  . OSA (obstructive sleep apnea) 11/17/2013  . Nausea alone 11/14/2013  . Wound of right leg 11/14/2013  . Unspecified constipation 11/12/2013  . Acute respiratory failure ([place]) 11/10/2013  . CHF (congestive heart failure) ([place]) 11/09/2013  . SOB (shortness of breath) 10/19/2013  . 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 Hematoma complicating a procedure 10/15/2013  . Bleeding into the skin 08/10/2013  . Bright red rectal bleeding 08/10/2013  . PAF (paroxysmal atrial fibrillation) ([place]) 07/05/2013  . Psoriasis 07/05/2013  . Chronic combined systolic and diastolic CHF (congestive heart failure) ([place]) 07/05/2013  . CKD (chronic kidney disease), stage IV ([place]) 08/15/2012  . Anemia of chronic disease 08/15/2012  . DM (diabetes mellitus), type 2 with renal complications ([place]) [id number]  . ATRIAL FIBRILLATION WITH RAPID VENTRICULAR RESPONSE 10/17/2010  . Acute combined systolic and diastolic heart failure ([place]) 10/17/2010  . HYPERLIPIDEMIA 03/18/2010  . HYPERTENSION 03/18/2010  . C V A / STROKE 03/18/2010  . OBSTRUCTIVE SLEEP APNEA 03/15/2010    Orientation RESPIRATION BLADDER Height & Weight     Self  O2 (Nasal Canula 2.5 L) Incontinent Weight: 163 lb 12.8 oz (74.3 kg) Height:  5\' 3"  (160 cm)  BEHAVIORAL SYMPTOMS/MOOD NEUROLOGICAL BOWEL NUTRITION STATUS   (None) Convulsions/Seizures Incontinent Diet (DYS 1, Fluid nectar  thick. Full supervision, meds crushed in puree.)  AMBULATORY STATUS COMMUNICATION OF NEEDS Skin   Total Care Verbally Other (Comment)  (Cracking, MASD)                       Personal Care Assistance Level of Assistance  Bathing, Feeding, Dressing, Total care Bathing Assistance: Maximum assistance Feeding assistance: Maximum assistance Dressing Assistance: Maximum assistance Total Care Assistance: Maximum assistance   Functional Limitations Info  Sight, Hearing, Speech Sight Info: Adequate Hearing Info: Adequate Speech Info: Adequate (Delayed responses)    SPECIAL CARE FACTORS FREQUENCY  Blood pressure, Diabetic urine testing, Speech therapy             Speech Therapy Frequency: 5 x week      Contractures Contractures Info: Not present    Additional Factors Info  Code Status, Allergies Code Status Info: Full Allergies Info: Daptomycin, Morphine And Related, Cephalexin, Tramadol, Atenolol, Cephalosporins, Dapsone, Erythromycin, Erythromycin Base, Celecoxib, Codeine, Fluoxetine Hcl, Latex, Ofloxacin, Penicillins, Rofecoxib, Sulfonamide Derivatives           Current Medications (06/25/2016):  This is the current hospital active medication list Current Facility-Administered Medications  Medication Dose Route Frequency Provider Last Rate Last Dose  . 0.9 %  sodium chloride infusion  250 mL Intravenous PRN Maren Reamer, MD      . 0.9 %  sodium chloride infusion   Intravenous Once Eugenie Filler, MD      . 0.9 %  sodium chloride infusion   Intravenous Continuous Eugenie Filler, MD 75 mL/hr at 06/25/16 1102    . acetaminophen (TYLENOL) suppository 325 mg  325 mg Rectal Q4H PRN Eugenie Filler, MD   325 mg at 06/25/16 1142  . allopurinol (ZYLOPRIM) tablet 100 mg  100 mg Oral Daily Maren Reamer, MD   100 mg at 06/25/16 1000  . amLODipine (NORVASC) tablet 10 mg  10 mg Oral QHS Maren Reamer, MD   10 mg at 06/22/16 2200  . atorvastatin (LIPITOR) tablet 20 mg  20 mg Oral QHS Maren Reamer, MD   20 mg at 06/22/16 2200  . famotidine (PEPCID) IVPB 20 mg premix  20 mg Intravenous Q12H Eugenie Filler, MD   20 mg at 06/25/16 1102  . hydrALAZINE (APRESOLINE) injection 10 mg  10 mg Intravenous Q6H PRN Maren Reamer, MD   10 mg at 06/25/16 1117  . hydrALAZINE (APRESOLINE) tablet 100 mg  100 mg Oral Q8H Maren Reamer, MD   100 mg at 06/25/16 1312  . isosorbide dinitrate (ISORDIL) tablet 30 mg  30 mg Oral BID Maren Reamer, MD   30 mg at 06/25/16 1000  . lacosamide (VIMPAT) 100 mg in sodium chloride 0.9 % 25 mL IVPB  100 mg Intravenous Q12H Charles Stewart   100 mg at 06/25/16 1136  . levETIRAcetam (KEPPRA) 1,000 mg in sodium chloride 0.9 % 100 mL IVPB  1,000 mg Intravenous Q12H Ivor Costa, MD   1,000 mg at 06/25/16 0845  . LORazepam (ATIVAN) injection 1-2 mg  1-2 mg Intravenous Q2H PRN Maren Reamer, MD   2 mg at 06/23/16 0558  . metoCLOPramide (REGLAN) injection 5 mg  5 mg Intravenous Q8H Brenton Grills, PA-C   5 mg at 06/25/16 1312  . metoprolol (LOPRESSOR) injection 5 mg  5 mg Intravenous Q6H Debby Crosley, MD   5 mg at 06/25/16 1126  . polyethylene glycol (MIRALAX / GLYCOLAX) packet 17 g  17 g Oral BID Maren Reamer, MD   17 g at 06/22/16 2200  . sodium chloride flush (NS) 0.9 % injection 3 mL  3 mL Intravenous Q12H Maren Reamer, MD   3 mL at 06/25/16 1000  . sodium chloride flush (NS) 0.9 % injection 3 mL  3 mL Intravenous PRN Maren Reamer, MD      . traZODone (DESYREL) tablet 50 mg  50 mg Oral QHS Maren Reamer, MD   50 mg at 06/22/16 2200  . valproate (DEPACON) 500 mg in dextrose 5 % 50 mL IVPB  500 mg Intravenous Q8H Greta Doom, MD   500 mg at 06/25/16 1224     Discharge Medications: Please see discharge summary for a list of discharge medications.  Relevant Imaging Results:  Relevant Lab Results:   Additional Information SS#: [id number]  Candie Chroman, LCSW

## 2016-06-25 NOTE — Progress Notes (Signed)
Patient Name: [name] Date of Encounter: 06/25/2016  Principal Problem:   Status epilepticus ([place]) Active Problems:   PAF (paroxysmal atrial fibrillation) (HCC)   Chronic combined systolic and diastolic CHF (congestive heart failure) (HCC)   SOB (shortness of breath)   Total self-care deficit   AKI (acute kidney injury) ([place])   CKD (chronic kidney disease) stage 3, GFR 30-59 ml/min   Malignant HTN with heart disease, w/o CHF, with chronic kidney disease   SVT (supraventricular tachycardia) (HCC)   Hematoma of left lower extremity   Pericardial effusion   Acute kidney injury superimposed on CKD [facility])   Patient Profile: [name] is a 65 year old female with a past medical history of chronic diastolic CHF, CKD, CVA, PAF not on anticoagulation due to history of severe GI bleed and severe hematoma. She resides at a SNF and is bedridden. She presented to the ED on 06/22/16 with confusion and seizure activity. HR was elevated in 130's-140's, with PAC's.   SUBJECTIVE: Mostly non verbal, resting comfortably.   OBJECTIVE Filed Vitals:   06/24/16 1955 06/24/16 2241 06/25/16 0304 06/25/16 0743  BP: 143/84 150/42 135/38 158/48  Pulse: 87 80 62 68  Temp: 99.1 F (37.3 C) 97.9 F (36.6 C) 98.3 F (36.8 C) 98 F (36.7 C)  TempSrc: Axillary Axillary Axillary Oral  Resp: 11 19 16 14   Height:      Weight:      SpO2: 100% 100% 100% 100%    Intake/Output Summary (Last 24 hours) at 06/25/16 1017 Last data filed at 06/25/16 0600  Gross per 24 hour  Intake   2285 ml  Output      0 ml  Net   2285 ml   Filed Weights   06/22/16 1331 06/22/16 1931  Weight: 224 lb (101.606 kg) 163 lb 12.8 oz (74.3 kg)    PHYSICAL EXAM General: Well developed, well nourished, female in no acute distress. Head: Normocephalic, atraumatic.  Neck: Supple without bruits, cannot assess JVD. Lungs:  Resp regular and unlabored, CTA. Heart: RRR, S1, S2, no S3, S4, or murmur; no rub. Abdomen:  Soft, non-tender, non-distended, BS + x 4.  Extremities: No clubbing, cyanosis, generalized edema.  Neuro:Alert, not able to assess orientation.  Psych: flat affect  LABS: CBC: Recent Labs  06/22/16 1240  06/25/16 0442 06/25/16 0737 06/25/16 0828  WBC 12.1*  < > 9.2 8.5  --   NEUTROABS 9.6*  --   --   --   --   HGB 10.2*  < > 6.6* 6.5* 6.5*  HCT 32.9*  < > 21.4* 21.4* 21.4*  MCV 92.9  < > 92.6 93.4  --   PLT 231  < > 159 166  --   < > = values in this interval not displayed. INR: Recent Labs  06/22/16 1240  INR [id number]   Basic Metabolic Panel: Recent Labs  06/23/16 0300 06/24/16 0509 06/25/16 0442 06/25/16 0737 06/25/16 0828  NA 143 144 142 144  --   K 4.2 3.5 5.2* 4.8 4.7  CL 114* 115* 115* 116*  --   CO2 18* 22 17* 20*  --   GLUCOSE 88 94 62* 69  --   BUN 36* 31* 39* 38*  --   CREATININE 1.72* 1.93* 2.81* 2.85*  --   CALCIUM 8.3* 8.3* 8.1* 8.0*  --   MG 1.9 1.9  --   --   --   PHOS 3.6  --   --   --   --  Liver Function Tests: Recent Labs  06/23/16 0300 06/24/16 0509  AST 20 15  ALT 9* 7*  ALKPHOS 94 86  BILITOT 0.6 0.7  PROT 5.7* 5.5*  ALBUMIN 2.3* 2.1*   Thyroid Function Tests: Recent Labs  06/24/16 0509  TSH 1.301     Current facility-administered medications:  .  0.45 % sodium chloride infusion, , Intravenous, Continuous, Maren Reamer, MD, Last Rate: 50 mL/hr at 06/23/16 1700 .  0.9 %  sodium chloride infusion, 250 mL, Intravenous, PRN, Maren Reamer, MD .  0.9 %  sodium chloride infusion, , Intravenous, Once, Eugenie Filler, MD .  acetaminophen (TYLENOL) suppository 325 mg, 325 mg, Rectal, Q4H PRN, Eugenie Filler, MD .  allopurinol (ZYLOPRIM) tablet 100 mg, 100 mg, Oral, Daily, Maren Reamer, MD, 100 mg at 06/23/16 0952 .  amLODipine (NORVASC) tablet 10 mg, 10 mg, Oral, QHS, Maren Reamer, MD, 10 mg at 06/22/16 2200 .  atorvastatin (LIPITOR) tablet 20 mg, 20 mg, Oral, QHS, Maren Reamer, MD, 20 mg at 06/22/16 2200 .   diphenhydrAMINE (BENADRYL) injection 25 mg, 25 mg, Intravenous, Once, Eugenie Filler, MD, 25 mg at 06/25/16 0846 .  famotidine (PEPCID) IVPB 20 mg premix, 20 mg, Intravenous, Q12H, Eugenie Filler, MD, 20 mg at 06/24/16 2327 .  furosemide (LASIX) injection 20 mg, 20 mg, Intravenous, Once, Eugenie Filler, MD .  hydrALAZINE (APRESOLINE) injection 10 mg, 10 mg, Intravenous, Q6H PRN, Maren Reamer, MD, 10 mg at 06/23/16 2026 .  hydrALAZINE (APRESOLINE) tablet 100 mg, 100 mg, Oral, Q8H, Maren Reamer, MD, 100 mg at 06/22/16 2200 .  isosorbide dinitrate (ISORDIL) tablet 30 mg, 30 mg, Oral, BID, Maren Reamer, MD, 30 mg at 06/22/16 2200 .  lacosamide (VIMPAT) 100 mg in sodium chloride 0.9 % 25 mL IVPB, 100 mg, Intravenous, Q12H, Wallie Char, 100 mg at 06/25/16 0135 .  levETIRAcetam (KEPPRA) 1,000 mg in sodium chloride 0.9 % 100 mL IVPB, 1,000 mg, Intravenous, Q12H, Ivor Costa, MD, 1,000 mg at 06/25/16 0845 .  LORazepam (ATIVAN) injection 1-2 mg, 1-2 mg, Intravenous, Q2H PRN, Maren Reamer, MD, 2 mg at 06/23/16 0558 .  metoCLOPramide (REGLAN) injection 5 mg, 5 mg, Intravenous, Q8H, [place], PA-C, 5 mg at 06/25/16 0535 .  metoprolol (LOPRESSOR) injection 5 mg, 5 mg, Intravenous, Q6H, Debby Crosley, MD, 5 mg at 06/25/16 0536 .  polyethylene glycol (MIRALAX / GLYCOLAX) packet 17 g, 17 g, Oral, BID, Maren Reamer, MD, 17 g at 06/22/16 2200 .  sodium chloride flush (NS) 0.9 % injection 3 mL, 3 mL, Intravenous, Q12H, Maren Reamer, MD, 3 mL at 06/24/16 2237 .  sodium chloride flush (NS) 0.9 % injection 3 mL, 3 mL, Intravenous, PRN, Maren Reamer, MD .  traZODone (DESYREL) tablet 50 mg, 50 mg, Oral, QHS, Maren Reamer, MD, 50 mg at 06/22/16 2200 .  valproate (DEPACON) 500 mg in dextrose 5 % 50 mL IVPB, 500 mg, Intravenous, Q12H, Ivor Costa, MD, 500 mg at 06/24/16 2311 . sodium chloride 50 mL/hr at 06/23/16 1700   TELE:   NSR with PAC's      ECG: NSR    Radiology/Studies: Mr Brain Wo Contrast  06/24/2016  CLINICAL DATA:  Initial evaluation for left-sided weakness and right-sidedPLEDs status post prolonged seizure. EXAM: MRI HEAD WITHOUT CONTRAST TECHNIQUE: Multiplanar, multiecho pulse sequences of the brain and surrounding structures were obtained without intravenous contrast. COMPARISON:  Prior CT from 06/22/2016 as  well as previous MRI from 07/24/2007. FINDINGS: Diffuse prominence of the CSF containing spaces is compatible with generalized cerebral atrophy, somewhat advanced for patient age. Encephalomalacia within the right temporal occipital region consistent with remote right PCA territory infarct. Small area of encephalomalacia within the parasagittal left occipital lobe consistent with remote left PCA territory infarct. Remote lacunar infarct within the left paramedian pons. There is extensive multi focal abnormal T2/FLAIR signal abnormality involving the bilateral cerebral hemispheres, left greater than right. This predominantly involves the deep and subcortical white matter, with probable some area tip patchy gray matter involvement. Frontal, parietal,, occipital, and temporal lobes affected. There is abnormal T2/FLAIR signal intensity within the bilateral cerebellar hemispheres. Abnormal signal intensity extends along the posterior limb of the left internal capsule. Otherwise relative sparing of the deep gray nuclei and brainstem. There is scattered patchy areas of restricted diffusion within knee many of these areas, particularly near the vertex (series 5, image 37). Additional subtle diffusion abnormality within the bilateral cerebellar hemispheres. Major intracranial vascular flow voids are preserved. No acute intracranial hemorrhage. No mass lesion, midline shift, or mass effect. No hydrocephalus. No extra-axial fluid collection. Major dural sinuses are grossly patent. Craniocervical junction within normal limits. Degenerative spondylolysis at  C3-4 with resultant moderate canal stenosis. Pituitary gland normal. No acute abnormality about the globes and orbits. Patient is status post lens extraction bilaterally. Scattered mucosal thickening throughout the ethmoidal air cells and maxillary sinuses. No air-fluid level to suggest active sinus infection. Trace opacity bilateral mastoid air cells. Inner ear structures grossly normal. Bone marrow signal intensity within normal limits. No scalp soft tissue abnormality. Findings favored to reflect PRES given the history of seizure. Superimposed changes related to the prolonged seizure may also be present. No evidence for acute vascular infarct. IMPRESSION: 1. Extensive multifocal T2/FLAIR signal abnormality involving the bilateral cerebral hemispheres as well as the cerebellum as above with associated patchy diffusion abnormality. Given the provided history, primary differential considerations include changes related to status epilepticus and/or PRES. 2. No acute vascular infarct identified. 3. Remote bilateral PCA territory infarcts with remote left pontine lacunar infarct. 4. Underlying mild chronic small vessel ischemic disease. Electronically Signed   By: Jeannine Boga M.D.   On: 06/24/2016 23:57     Current Medications:  . sodium chloride   Intravenous Once  . allopurinol  100 mg Oral Daily  . amLODipine  10 mg Oral QHS  . atorvastatin  20 mg Oral QHS  . diphenhydrAMINE  25 mg Intravenous Once  . famotidine (PEPCID) IV  20 mg Intravenous Q12H  . furosemide  20 mg Intravenous Once  . hydrALAZINE  100 mg Oral Q8H  . isosorbide dinitrate  30 mg Oral BID  . lacosamide (VIMPAT) IV  100 mg Intravenous Q12H  . levETIRAcetam  1,000 mg Intravenous Q12H  . metoCLOPramide (REGLAN) injection  5 mg Intravenous Q8H  . metoprolol  5 mg Intravenous Q6H  . polyethylene glycol  17 g Oral BID  . sodium chloride flush  3 mL Intravenous Q12H  . traZODone  50 mg Oral QHS  . valproate sodium  500 mg  Intravenous Q12H   . sodium chloride 50 mL/hr at 06/23/16 1700    ASSESSMENT AND PLAN: Principal Problem:   Status epilepticus ([place]) Active Problems:   PAF (paroxysmal atrial fibrillation) (HCC)   Chronic combined systolic and diastolic CHF (congestive heart failure) (HCC)   SOB (shortness of breath)   Total self-care deficit   AKI (acute kidney injury) ([place])  CKD (chronic kidney disease) stage 3, GFR 30-59 ml/min   Malignant HTN with heart disease, w/o CHF, with chronic kidney disease   SVT (supraventricular tachycardia) (HCC)   Hematoma of left lower extremity   Pericardial effusion   Acute kidney injury superimposed on CKD ([place])  1. PAF: Review of tele shows NSR with PAC's,  had some episodes of Afib and SVT with rates up to 205 a few days ago, this has resolved. She is rate controlled overnight with metoprolol 5mg  IV, continue same.   Not on chronic anticoagulation due to history of GI bleed and hematoma. Was on heparin gtt, it was stopped  for left lower leg hematoma rupture.  2. Chronic diastolic CHF: Echo shows grade 1 diastolic dysfunction and akinesis of mid anteroseptal, anterior and apical anterior walls, not noted on Echo from 2014. Not a candidate for heart cath at this time. Continue medical management with beta blocker. She cannot take by mouth medications so her long acting nitrates, hydralazine and amlodipine are on hold.   3. Right sided PLED's following prolonged seizure: management per Neuro.           Signed, Arbutus Leas , NP 10:17 AM 06/25/2016 Pager [phone]  Patient examined chart reviewed Non verbal for me. No anticoagulation given hematoma And seizures.  Rhythm has settled down and likely related to hypoxia and seizures continue Beta blockers   Will sign off  Jenkins Rouge

## 2016-06-25 NOTE — Progress Notes (Signed)
Patients HR elevated into the 170's sustaining for sixty seconds and then decreased into the 90's.  Pt. BP elevated into the 188/75.  Administered patients PRN hydralazine and contacted Dr. Grandville Silos.  Received order to give patients scheduled metoprolol early and update cardiology.  Updated NP Shelle Iron of change in patients condition, received no new orders at this time.  Will continue to monitor.

## 2016-06-25 NOTE — Progress Notes (Signed)
[id number] [place] RN Liaison Note   This is a GIP, related, covered hospital admission of 06/22/16 per Dr. Shelbie Proctor, with a [facility] Dx of cerebrovascular disease. Patient was transferred to the hospital from [facility] facility where she resides. Patient was admitted with seizures. [facility] was not notified by facilty or family of transfer. Code status is full code.   Patient seen in room, laying in bed, with husband at bedside. Patient sleeping soundly, however arouses easily to voice. Patient appears comfortable. She has 02 in place at 2.5 [place]. She continues to receive IVF via a PIV.  RN at bedside and reports a recent episode of tachycardia in which PRN hydralazine and scheduled Metoprolol was administered and patient HR now in the 60's. Patient received 20 mg Lasix IV.  She is receiving Vimpat 100 mg IV Q 12 hours and Keppra 100 mg IV Q 12 hours, and Valproate 500 mg  Q 8 hours IV.  MRI of brain obtained this morning. Patient appearing to move right side more than the left. Cardiology evaluated patient this morning and plan is to continue beta blockers.  No anticoagulation given d/t hematoma and seizures. Dysphagia 1 diet ordered, but patient has not had any po intake at this time. Patient remains a FULL CODE and will contact [name] social worker to continue discussions regarding code status and goals of care.  Left contact information with husband if any needs arise.  [facility] will continue to follow patient until discharge. Please call with any hospice-related questions or concerns.  Freddi Starr, RN, [facility] Liaison  [phone]

## 2016-06-26 ENCOUNTER — Inpatient Hospital Stay (HOSPITAL_COMMUNITY)

## 2016-06-26 DIAGNOSIS — E440 Moderate protein-calorie malnutrition: Secondary | ICD-10-CM | POA: Insufficient documentation

## 2016-06-26 LAB — HEMOGLOBIN AND HEMATOCRIT, BLOOD
HEMATOCRIT: 29.8 % — AB (ref 36.0–46.0)
HEMOGLOBIN: 9.4 g/dL — AB (ref 12.0–15.0)

## 2016-06-26 LAB — TYPE AND SCREEN
ABO/RH(D): A POS
ANTIBODY SCREEN: POSITIVE
DAT, IgG: NEGATIVE
DONOR AG TYPE: NEGATIVE
DONOR AG TYPE: NEGATIVE
PT AG TYPE: NEGATIVE
Unit division: 0
Unit division: 0

## 2016-06-26 LAB — CBC
HEMATOCRIT: 28.8 % — AB (ref 36.0–46.0)
Hemoglobin: 9.4 g/dL — ABNORMAL LOW (ref 12.0–15.0)
MCH: 29.2 pg (ref 26.0–34.0)
MCHC: 32.6 g/dL (ref 30.0–36.0)
MCV: 89.4 fL (ref 78.0–100.0)
Platelets: 161 10*3/uL (ref 150–400)
RBC: 3.22 MIL/uL — ABNORMAL LOW (ref 3.87–5.11)
RDW: 16 % — AB (ref 11.5–15.5)
WBC: 11.1 10*3/uL — AB (ref 4.0–10.5)

## 2016-06-26 LAB — CULTURE, BLOOD (ROUTINE X 2)

## 2016-06-26 LAB — RENAL FUNCTION PANEL
ANION GAP: 11 (ref 5–15)
Albumin: 2 g/dL — ABNORMAL LOW (ref 3.5–5.0)
BUN: 36 mg/dL — AB (ref 6–20)
CHLORIDE: 116 mmol/L — AB (ref 101–111)
CO2: 19 mmol/L — ABNORMAL LOW (ref 22–32)
Calcium: 8.1 mg/dL — ABNORMAL LOW (ref 8.9–10.3)
Creatinine, Ser: 2.69 mg/dL — ABNORMAL HIGH (ref 0.44–1.00)
GFR calc Af Amer: 20 mL/min — ABNORMAL LOW (ref >60)
GFR, EST NON AFRICAN AMERICAN: 17 mL/min — AB (ref >60)
Glucose, Bld: 81 mg/dL (ref 65–99)
POTASSIUM: 4.5 mmol/L (ref 3.5–5.1)
Phosphorus: 5.2 mg/dL — ABNORMAL HIGH (ref 2.5–4.6)
Sodium: 146 mmol/L — ABNORMAL HIGH (ref 135–145)

## 2016-06-26 LAB — MAGNESIUM: Magnesium: 1.9 mg/dL (ref 1.7–2.4)

## 2016-06-26 MED ORDER — SODIUM CHLORIDE 0.9 % IV SOLN: 500.0000 mg | Freq: Two times a day (BID) | INTRAVENOUS | Status: DC

## 2016-06-26 MED ORDER — TECHNETIUM TC 99M DIETHYLENETRIAME-PENTAACETIC ACID: 30.0000 | Freq: Once | INTRAVENOUS | Status: DC | PRN

## 2016-06-26 MED ORDER — LACOSAMIDE 50 MG PO TABS
100.0000 mg | ORAL_TABLET | Freq: Two times a day (BID) | ORAL | Status: DC
  Administered 2016-06-26 – 2016-06-29 (×6): 100 mg via ORAL
  Filled 2016-06-26 (×6): qty 2

## 2016-06-26 MED ORDER — DIVALPROEX SODIUM 500 MG PO DR TAB: 500.0000 mg | DELAYED_RELEASE_TABLET | Freq: Three times a day (TID) | ORAL | Status: DC

## 2016-06-26 MED ORDER — TECHNETIUM TO 99M ALBUMIN AGGREGATED
4.0000 | Freq: Once | INTRAVENOUS | Status: AC | PRN
  Administered 2016-06-26: 4 via INTRAVENOUS

## 2016-06-26 MED ORDER — DEXTROSE 5 % IV SOLN
500.0000 mg | Freq: Three times a day (TID) | INTRAVENOUS | Status: DC
  Administered 2016-06-26 – 2016-06-27 (×4): 500 mg via INTRAVENOUS
  Filled 2016-06-26 (×6): qty 0.5

## 2016-06-26 MED ORDER — FAMOTIDINE 20 MG PO TABS
20.0000 mg | ORAL_TABLET | Freq: Every day | ORAL | Status: DC
  Administered 2016-06-26 – 2016-06-28 (×3): 20 mg via ORAL
  Filled 2016-06-26 (×3): qty 1

## 2016-06-26 MED ORDER — METOPROLOL TARTRATE 5 MG/5ML IV SOLN
5.0000 mg | Freq: Once | INTRAVENOUS | Status: AC
  Administered 2016-06-26: 5 mg via INTRAVENOUS

## 2016-06-26 MED ORDER — WHITE PETROLATUM GEL
Status: AC
  Administered 2016-06-26: 0.2
  Filled 2016-06-26: qty 1

## 2016-06-26 MED ORDER — DEXTROSE 5 % IV SOLN
INTRAVENOUS | Status: DC
  Administered 2016-06-26 – 2016-06-28 (×4): via INTRAVENOUS

## 2016-06-26 MED ORDER — LEVETIRACETAM 500 MG PO TABS
500.0000 mg | ORAL_TABLET | Freq: Two times a day (BID) | ORAL | Status: DC
  Administered 2016-06-26 – 2016-06-29 (×6): 500 mg via ORAL
  Filled 2016-06-26 (×6): qty 1

## 2016-06-26 MED ORDER — METOPROLOL TARTRATE 5 MG/5ML IV SOLN
5.0000 mg | Freq: Three times a day (TID) | INTRAVENOUS | Status: DC | PRN
  Filled 2016-06-26: qty 5

## 2016-06-26 MED ORDER — DIVALPROEX SODIUM 125 MG PO CSDR
500.0000 mg | DELAYED_RELEASE_CAPSULE | Freq: Three times a day (TID) | ORAL | Status: DC
  Administered 2016-06-26 – 2016-06-29 (×10): 500 mg via ORAL
  Filled 2016-06-26 (×11): qty 4

## 2016-06-26 NOTE — Progress Notes (Signed)
SLP Cancellation Note  Patient Details Name: [name] MRN: [id number] DOB: [birth year]   Cancelled treatment:       Reason Eval/Treat Not Completed: Patient at procedure or test/unavailable. Will reattempt as time allows.   Kern Reap, [place], CCC-SLP 06/26/2016, 11:23 AM [phone]

## 2016-06-26 NOTE — Progress Notes (Signed)
   06/26/16 2006  Vitals  Pulse Rate (!) 169  ECG Heart Rate (!) 170  Resp (!) 23  Oxygen Therapy  SpO2 97 %  Call doctor to notify of SVT

## 2016-06-26 NOTE — Progress Notes (Signed)
[id number] [place] RN Liaison Note   This is a GIP, related, covered hospital admission of 06/22/16 per Dr. Shelbie Proctor, with a [facility] Dx of cerebrovascular disease. Patient is a FULL Code.  Patient seen in room, laying in bed, with husband at bedside. Patient sleeping soundly, however arouses easily to voice. Patient appears comfortable. She is on 02 at 2 [place]. She continues to receive IVF via a PIV. She is on Azactam IV for blood culture 7/15 aerobic growing gram positive rods. She is receiving Vimpat 100 mg IV Q 12 hours and Keppra 100 mg IV Q 12 hours, and Valproate 500 mg Q 8 hours IV. Per Neurology, patient has PRES and will needs strict blood pressure control.  She may be able to transition to PO anticonvulsant medications, when she is consistently tolerating food. Husband reported she able to eat breakfast with assistance, and ate half of an orange yesterday.    Spoke to husband ingreat detail regarding code status.  Husband reflected on her decline over the past 3 years.  He stated his ultimate goal for his wife is comfort. He stated it was difficult to watch this most recent episode with the seizure activity.  He was clear that he does not want her to suffer anymore.  Per discussion, he does want the the current plan of treatment continued as she stated she could return to her prior baseline. Spoke to Dr. Tyrell Antonio about husband's wishes for his wife and she plans to follow up with him. Left contact information with husband if any needs arise.  [facility] will continue to follow patient until discharge. Please call with any hospice-related questions or concerns.  Freddi Starr, RN, [facility] Liaison  [phone]

## 2016-06-26 NOTE — Progress Notes (Addendum)
PROGRESS NOTE    [name]  [id number] DOB: Aug 05, 1951 DOA: 06/22/2016 PCP: Kandice Hams, MD    Brief Narrative:  Patient is a 65 year old female bedbound for the past 3 years nursing home resident with history of severe debility, atrial fibrillation, hypertension, chronic kidney disease stage III, obstructive sleep apnea, CHF, morbid obesity, history of seizures presented to the ED with status epilepticus. Patient initially loaded with Keppra and placed on IV Keppra twice daily. Patient had also presented with shortness of breath and tachypnea. Due to patient's mobility status, presentation was shortness of breath and tachypnea concern was for PE and a such VQ scan was ordered. On route to VQ scan patient noted to have another episode of seizures and had to be loaded with IV Depakote and IV Keppra and doses of Keppra increased and patient placed on Depakote as well as Vimpat. Patient also on heparin drip. Patient opens eyes to verbal stimuli however not following commands and nonresponsive which her husband is not her baseline. Patient also noted on the night of 06/22/2016 went into SVT requiring adenosine and now on scheduled IV Lopressor. Neurology and cardiology have been consulted. Patient underwent EEG which was consistent with status epilepticus which improved daily. Patient subsequently underwent MRI of the head with findings consistent with status epilepticus versus PRES. goal blood pressure systolic less than [id number]. 2-D echo done did show a mild to moderate pericardial effusion per cardiology.   Assessment & Plan:   Principal Problem:   Status epilepticus ([place]) Active Problems:   PAF (paroxysmal atrial fibrillation) (HCC)   Chronic combined systolic and diastolic CHF (congestive heart failure) (HCC)   SOB (shortness of breath)   Total self-care deficit   AKI (acute kidney injury) ([place])   CKD (chronic kidney disease) stage 3, GFR 30-59 ml/min   Malignant HTN with heart  disease, w/o CHF, with chronic kidney disease   SVT (supraventricular tachycardia) (HCC)   Hematoma of left lower extremity   Pericardial effusion   Acute kidney injury superimposed on CKD ([place])   PRES (posterior reversible encephalopathy syndrome): Possible   Malnutrition of moderate degree  1- status epilepticus VS PRES Patient presented with generalized tonic-clonic seizures after episode of confusion and disorientation. Patient was given a loading dose of Keppra and placed on Keppra 750 mg twice daily. CT head which was done showed prior bilateral occipital ischemic changes. There was also concern that patient may have had arrhythmias which may have led to decreased cerebral perfusion provoking seizures secondary to bilateral occipital ischemic changes noted. The night of 06/22/2016, patient was noted to have episode of seizures on route to VQ scan and received Ativan at that time. Neurology was called and patient was loaded with 1 g of IV Depakote followed by 500 mg of IV Depakote twice daily as well as IV Keppra and Keppra dose increased to 1 g IV twice daily. Patient was also given IV Vimpat and placed on 100 mg of Vimpat twice daily. Patient with no signs of infectious etiology. Patient with low-grade fever this morning and also leukocytosis however these may have likely been secondary to seizure episodes. Leukocytosis trending down. EEG consistent with status epilepticus. Concern for possible acute ischemic CVA and as such MRI of the brain was done 06/24/2016 that showed extensive multifocal T2/FLAIR signal abnormality involving bilateral cerebral hemispheres as well as cerebellum with associated patchy diffusion abnormality likely related to status epilepticus and/or. PRES. No acute vascular infarct.remote bilateral PCA territory infarcts with remote left pontine  lacunar infarct. Underlying mild chronic small vessel ischemic disease.  -Valproic acid level decreased at 29. -Continue IV Lopressor,  resume home regimen of Norvasc and hydralazine and Isordil . -Goal blood pressure systolic less than [id number]. Continue Vimpat, IV Keppra, IV Depakote. Per neurology.  2 SVT Patient noted to go into SVT the night of 06/22/2016, and had to be given adenosine 6 mg IV 1 with improvement and subsequently placed on scheduled Lopressor.TSH within normal limits at 1.301. 2-D echo with EF of 50-55% with grade 1 diastolic dysfunction and mild to moderate pericardial effusion with no signs of tamponade. Continue scheduled IV Lopressor. Cardiology sign off.   3 mild to moderate pericardial effusion Per 2-D echo. No signs of tamponade. Monitor. Per cardiology.  4 Paroxysmal atrial fibrillation Currently rate controlled on scheduled IV Lopressor. Patient NOT on anticoagulation in the past secondary to history of GI bleed and hematoma. Discontinued IV heparin due to bleeding hematoma noted this morning.   5 Shortness of breath/tachypnea Patient presented with shortness of breath and tachypnea with a history of being bedbound for approximately 3 years. Concern for pulmonary emboli. VQ scan was ordered however on route to VQ scan patient had seizures and a such VQ scan is still pending. D/C IV heparin drip. Repeat chest x-ray 06/23/2016 with no significant change. Follow. -V-Q scan today/   6 Malignant hypertension in the setting of chronic kidney disease Patient currently following some commands. Continue scheduled IV Lopressor. Hydralazine as needed.  -Continue with of Norvasc, hydralazine, Isordil .  - Goal systolic blood pressure less than 140. Hydralazine when necessary.  7 Acute on chronic kidney disease stage III Likely secondary to prerenal azotemia. Patient noted to have acute blood loss secondary to hematoma with hemoglobin at 6.5. -renal US negative for hydronephrosis.  -IV fluids. Follow trend.  -Hypernatremia; Change IV fluids to D 5.   8 Leukocytosis UA with too numerous to count WBC. Will  follow urine culture.  Blood culture 7-15 aerobic growing gram positive rods. Considering contaminant.  Will start  aztreonam.  Repeat blood culture.   9 combined chronic systolic and diastolic heart failure Currently stable.  10 left lower extremity bleeding hematoma Patient noted to have a bleeding/ruptured hematoma the morning of 06/24/2016 and pressure dressing in place. No further bleeding per nursing. Discontinued IV heparin. Patient with ruptured/bleeding hematoma the morning of 06/24/2016 and as such anticoagulation was discontinued.  Patient with prior history of GI bleed and hematoma and a such on NO anticoagulation for A. fib. Heparin has been discontinued.   11 ABLA Likely secondary to ruptured LLE hematoma. HGB 6.5 from 9.7. S/P  transfuse 2 units PRBCs 7-18. Repeat hb tonight   12 prognosis Patient has been bedridden for the past 3 years with comorbidities admitted with status epilepticus. Patient also noted to go into SVT during the hospitalization. Patient had MRI done with findings concerning for status epilepticus versus PRES. patient indeed has PRES will need tighter blood pressure control with goal systolic blood pressure less than 140. Patient was on hospice prior to admission however was a full code. Discussed with patient's husband in terms of prognosis and depending on neurology's evaluation will see how patient does over the next few days and if no significant improvement or deterioration may consider palliative care consult for goals of care. If patient indeed has PRES will likely take several weeks to see full recovery and in that case could have palliative care goals of care done in the outpatient setting.  -  Husband would like to speak with neurology  DVT prophylaxis: SCD Code Status: Partial code, discussed with husband. He does not wants patient to be intubated.  Family Communication: Updated husband at bedside. He would like to speak with neurology, and  subsequently with hospice.  Disposition Plan: Remain the step down unit. Back to nursing facility when medically stable.   Consultants:   Neurology: Dr. Tasia Catchings 06/22/2016  Cardiology: Dr. Meda Coffee 06/23/2016  Procedures:  EEG 06/23/2016  CT head 06/22/2016  Chest x-ray 06/22/2016, 06/23/2016  MRI 06/24/2016  2 d echo 06/24/2016  2 units PRBC   Antimicrobials:   None   Subjective: Patient laying in bed, alert, she was able to tell me her name, recognized husband.  Denies pain.   Objective: Filed Vitals:   06/25/16 1732 06/25/16 2004 06/25/16 2240 06/26/16 0356  BP: 165/59 173/60 152/90 157/60  Pulse: 68 73  69  Temp: 98 F (36.7 C) 98.4 F (36.9 C) 99 F (37.2 C) 98.3 F (36.8 C)  TempSrc: Axillary Axillary Axillary Axillary  Resp: 19 18 20 17   Height:      Weight:      SpO2: 100% 100% 100% 100%    Intake/Output Summary (Last 24 hours) at 06/26/16 0810 Last data filed at 06/26/16 0600  Gross per 24 hour  Intake 2642.5 ml  Output      0 ml  Net 2642.5 ml   Filed Weights   06/22/16 1331 06/22/16 1931  Weight: 101.606 kg (224 lb) 74.3 kg (163 lb 12.8 oz)    Examination:  General exam; alert . Following some commands.  Respiratory system: Clear to auscultation anterior lung fields. Respiratory effort normal. Cardiovascular system: Irregularly irregular. No JVD, murmurs, rubs, gallops or clicks. No pedal edema. Gastrointestinal system: Abdomen is nondistended, soft and nontender. No organomegaly or masses felt. Normal bowel sounds heard. Central nervous system: Alert and oriented to self only. Extremities: 3-4/5 RUE strength, 2/5 LUE strength Skin: No rashes, lesions or ulcers. Left lower extremity with pressure dressing status post ruptured hematoma. Psychiatry: Judgement and insight appear poor. Mood & affect flat.     Data Reviewed: I have personally reviewed following labs and imaging studies  CBC:  Recent Labs Lab 06/22/16 1240   06/23/16 0300 06/24/16 0509 06/25/16 0442 06/25/16 0737 06/25/16 0828 06/26/16 0512  WBC 12.1*  --  17.9* 10.4 9.2 8.5  --  11.1*  NEUTROABS 9.6*  --   --   --   --   --   --   --   HGB 10.2*  < > 10.4* 9.7* 6.6* 6.5* 6.5* 9.4*  HCT 32.9*  < > 33.3* 31.5* 21.4* 21.4* 21.4* 28.8*  MCV 92.9  --  91.5 91.3 92.6 93.4  --  89.4  PLT 231  --  207 218 159 166  --  161  < > = values in this interval not displayed. Basic Metabolic Panel:  Recent Labs Lab 06/23/16 0300 06/24/16 0509 06/25/16 0442 06/25/16 0737 06/25/16 0828 06/26/16 0512  NA 143 144 142 144  --  146*  K 4.2 3.5 5.2* 4.8 4.7 4.5  CL 114* 115* 115* 116*  --  116*  CO2 18* 22 17* 20*  --  19*  GLUCOSE 88 94 62* 69  --  81  BUN 36* 31* 39* 38*  --  36*  CREATININE 1.72* 1.93* 2.81* 2.85*  --  2.69*  CALCIUM 8.3* 8.3* 8.1* 8.0*  --  8.1*  MG 1.9 1.9  --   --   --   --  PHOS 3.6  --   --   --   --  5.2*   GFR: Estimated Creatinine Clearance: 19.9 mL/min (by C-G formula based on Cr of 2.69). Liver Function Tests:  Recent Labs Lab 06/22/16 1240 06/23/16 0300 06/24/16 0509 06/26/16 0512  AST 23 20 15   --   ALT 9* 9* 7*  --   ALKPHOS 106 94 86  --   BILITOT 0.7 0.6 0.7  --   PROT 6.3* 5.7* 5.5*  --   ALBUMIN 2.8* 2.3* 2.1* 2.0*   No results for input(s): LIPASE, AMYLASE in the last 168 hours. No results for input(s): AMMONIA in the last 168 hours. Coagulation Profile:  Recent Labs Lab 06/22/16 1240  INR 0.94   Cardiac Enzymes: No results for input(s): CKTOTAL, CKMB, CKMBINDEX, TROPONINI in the last 168 hours. BNP (last 3 results) No results for input(s): PROBNP in the last 8760 hours. HbA1C: No results for input(s): HGBA1C in the last 72 hours. CBG: No results for input(s): GLUCAP in the last 168 hours. Lipid Profile: No results for input(s): CHOL, HDL, LDLCALC, TRIG, CHOLHDL, LDLDIRECT in the last 72 hours. Thyroid Function Tests:  Recent Labs  06/24/16 0509  TSH 1.301   Anemia Panel: No  results for input(s): VITAMINB12, FOLATE, FERRITIN, TIBC, IRON, RETICCTPCT in the last 72 hours. Sepsis Labs:  Recent Labs Lab 06/22/16 1255  LATICACIDVEN 1.44    Recent Results (from the past 240 hour(s))  Blood Culture (routine x 2)     Status: None (Preliminary result)   Collection Time: 06/22/16  2:01 PM  Result Value Ref Range Status   Specimen Description BLOOD LEFT WRIST  Final   Special Requests BOTTLES DRAWN AEROBIC ONLY 5CC  Final   Culture  Setup Time   Final    GRAM POSITIVE RODS AEROBIC BOTTLE ONLY CRITICAL VALUE NOTED.  VALUE IS CONSISTENT WITH PREVIOUSLY REPORTED AND CALLED VALUE.    Culture GRAM POSITIVE RODS  Final   Report Status PENDING  Incomplete  Blood Culture (routine x 2)     Status: None (Preliminary result)   Collection Time: 06/22/16  2:05 PM  Result Value Ref Range Status   Specimen Description BLOOD LEFT HAND  Final   Special Requests BOTTLES DRAWN AEROBIC ONLY 5CC  Final   Culture  Setup Time   Final    GRAM POSITIVE RODS AEROBIC BOTTLE ONLY CRITICAL RESULT CALLED TO, READ BACK BY AND VERIFIED WITH: M TURNER,PHARMD AT 1522 06/24/16 BY L BENFIELD    Culture GRAM POSITIVE RODS  Final   Report Status PENDING  Incomplete  Blood Culture ID Panel (Reflexed)     Status: None   Collection Time: 06/22/16  2:05 PM  Result Value Ref Range Status   Enterococcus species NOT DETECTED NOT DETECTED Final   Vancomycin resistance NOT DETECTED NOT DETECTED Final   Listeria monocytogenes NOT DETECTED NOT DETECTED Final   Staphylococcus species NOT DETECTED NOT DETECTED Final   Staphylococcus aureus NOT DETECTED NOT DETECTED Final   Methicillin resistance NOT DETECTED NOT DETECTED Final   Streptococcus species NOT DETECTED NOT DETECTED Final   Streptococcus agalactiae NOT DETECTED NOT DETECTED Final   Streptococcus pneumoniae NOT DETECTED NOT DETECTED Final   Streptococcus pyogenes NOT DETECTED NOT DETECTED Final   Acinetobacter baumannii NOT DETECTED NOT  DETECTED Final   Enterobacteriaceae species NOT DETECTED NOT DETECTED Final   Enterobacter cloacae complex NOT DETECTED NOT DETECTED Final   Escherichia coli NOT DETECTED NOT DETECTED Final  Klebsiella oxytoca NOT DETECTED NOT DETECTED Final   Klebsiella pneumoniae NOT DETECTED NOT DETECTED Final   Proteus species NOT DETECTED NOT DETECTED Final   Serratia marcescens NOT DETECTED NOT DETECTED Final   Carbapenem resistance NOT DETECTED NOT DETECTED Final   Haemophilus influenzae NOT DETECTED NOT DETECTED Final   Neisseria meningitidis NOT DETECTED NOT DETECTED Final   Pseudomonas aeruginosa NOT DETECTED NOT DETECTED Final   Candida albicans NOT DETECTED NOT DETECTED Final   Candida glabrata NOT DETECTED NOT DETECTED Final   Candida krusei NOT DETECTED NOT DETECTED Final   Candida parapsilosis NOT DETECTED NOT DETECTED Final   Candida tropicalis NOT DETECTED NOT DETECTED Final  Urine culture     Status: Abnormal   Collection Time: 06/22/16  3:52 PM  Result Value Ref Range Status   Specimen Description URINE, CATHETERIZED  Final   Special Requests NONE  Final   Culture MULTIPLE SPECIES PRESENT, SUGGEST RECOLLECTION (A)  Final   Report Status 06/23/2016 FINAL  Final  MRSA PCR Screening     Status: None   Collection Time: 06/23/16  1:03 AM  Result Value Ref Range Status   MRSA by PCR NEGATIVE NEGATIVE Final    Comment:        The GeneXpert MRSA Assay (FDA approved for NASAL specimens only), is one component of a comprehensive MRSA colonization surveillance program. It is not intended to diagnose MRSA infection nor to guide or monitor treatment for MRSA infections.          Radiology Studies: Mr Brain Wo Contrast  06/24/2016  CLINICAL DATA:  Initial evaluation for left-sided weakness and right-sidedPLEDs status post prolonged seizure. EXAM: MRI HEAD WITHOUT CONTRAST TECHNIQUE: Multiplanar, multiecho pulse sequences of the brain and surrounding structures were obtained  without intravenous contrast. COMPARISON:  Prior CT from 06/22/2016 as well as previous MRI from 07/24/2007. FINDINGS: Diffuse prominence of the CSF containing spaces is compatible with generalized cerebral atrophy, somewhat advanced for patient age. Encephalomalacia within the right temporal occipital region consistent with remote right PCA territory infarct. Small area of encephalomalacia within the parasagittal left occipital lobe consistent with remote left PCA territory infarct. Remote lacunar infarct within the left paramedian pons. There is extensive multi focal abnormal T2/FLAIR signal abnormality involving the bilateral cerebral hemispheres, left greater than right. This predominantly involves the deep and subcortical white matter, with probable some area tip patchy gray matter involvement. Frontal, parietal,, occipital, and temporal lobes affected. There is abnormal T2/FLAIR signal intensity within the bilateral cerebellar hemispheres. Abnormal signal intensity extends along the posterior limb of the left internal capsule. Otherwise relative sparing of the deep gray nuclei and brainstem. There is scattered patchy areas of restricted diffusion within knee many of these areas, particularly near the vertex (series 5, image 37). Additional subtle diffusion abnormality within the bilateral cerebellar hemispheres. Major intracranial vascular flow voids are preserved. No acute intracranial hemorrhage. No mass lesion, midline shift, or mass effect. No hydrocephalus. No extra-axial fluid collection. Major dural sinuses are grossly patent. Craniocervical junction within normal limits. Degenerative spondylolysis at C3-4 with resultant moderate canal stenosis. Pituitary gland normal. No acute abnormality about the globes and orbits. Patient is status post lens extraction bilaterally. Scattered mucosal thickening throughout the ethmoidal air cells and maxillary sinuses. No air-fluid level to suggest active sinus  infection. Trace opacity bilateral mastoid air cells. Inner ear structures grossly normal. Bone marrow signal intensity within normal limits. No scalp soft tissue abnormality. Findings favored to reflect PRES given the history of  seizure. Superimposed changes related to the prolonged seizure may also be present. No evidence for acute vascular infarct. IMPRESSION: 1. Extensive multifocal T2/FLAIR signal abnormality involving the bilateral cerebral hemispheres as well as the cerebellum as above with associated patchy diffusion abnormality. Given the provided history, primary differential considerations include changes related to status epilepticus and/or PRES. 2. No acute vascular infarct identified. 3. Remote bilateral PCA territory infarcts with remote left pontine lacunar infarct. 4. Underlying mild chronic small vessel ischemic disease. Electronically Signed   By: Jeannine Boga M.D.   On: 06/24/2016 23:57   US Renal  06/25/2016  CLINICAL DATA:  Patient with acute renal failure. Elevated BUN and creatinine on laboratory analysis. EXAM: RENAL / URINARY TRACT ULTRASOUND COMPLETE COMPARISON:  Ultrasound present renal ultrasound 08/11/2013 FINDINGS: Right Kidney: Length: 9.2 cm. Renal cortical thinning. Increased renal cortical echogenicity. Multiple hypoechoic masses most compatible with cyst measuring up to 1.8 cm. No hydronephrosis. Left Kidney: Length: 12.9 cm. Normal renal cortical thickness and echogenicity. No hydronephrosis. Off of the inferior pole of the left kidney there is a 3.4 cm cyst. Bladder: Not well visualized. Incidental note is made of a right pleural effusion. IMPRESSION: No hydronephrosis. Atrophic right kidney. Electronically Signed   By: Lovey Newcomer M.D.   On: 06/25/2016 15:55        Scheduled Meds: . sodium chloride   Intravenous Once  . allopurinol  100 mg Oral Daily  . amLODipine  10 mg Oral QHS  . atorvastatin  20 mg Oral QHS  . famotidine (PEPCID) IV  20 mg Intravenous  Q24H  . hydrALAZINE  100 mg Oral Q8H  . isosorbide dinitrate  30 mg Oral BID  . lacosamide (VIMPAT) IV  100 mg Intravenous Q12H  . levETIRAcetam  1,000 mg Intravenous Q12H  . metoCLOPramide (REGLAN) injection  5 mg Intravenous Q8H  . metoprolol  5 mg Intravenous Q6H  . polyethylene glycol  17 g Oral BID  . sodium chloride flush  3 mL Intravenous Q12H  . traZODone  50 mg Oral QHS  . valproate sodium  500 mg Intravenous Q8H   Continuous Infusions: . sodium chloride 75 mL/hr at 06/25/16 1102     LOS: 3 days    Time spent: [place], Cassie Freer, MD [facility] Pager [phone]  If 7PM-7AM, please contact night-coverage [email or web address] Password [email or web address] 06/26/2016, 8:10 AM

## 2016-06-26 NOTE — Progress Notes (Signed)
Pharmacy Antibiotic Note  [name] is a 65 y.o. female on aztreonam per pharmacy for suspected UTI. Pt currently afebrile. Small bump in WBC though still WNL - 8.5>11.1. Baseline SCr ~1.5 now 2.69; AKI. UCx on 7/15 revealed mult species with catheterized sample  Plan: -Start aztreonam 500mg  Q8H -Monitor renal fcn closely -F/U cultures  -Monitor clinical progress, LOT  Height: 5\' 3"  (160 cm) Weight: 163 lb 12.8 oz (74.3 kg) IBW/kg (Calculated) : 52.4  Temp (24hrs), Avg:98.2 F (36.8 C), Min:97.2 F (36.2 C), Max:99 F (37.2 C)   Recent Labs Lab 06/22/16 1255 06/23/16 0300 06/24/16 0509 06/25/16 0442 06/25/16 0737 06/26/16 0512  WBC  --  17.9* 10.4 9.2 8.5 11.1*  CREATININE 1.70* 1.72* 1.93* 2.81* 2.85* 2.69*  LATICACIDVEN 1.44  --   --   --   --   --     Estimated Creatinine Clearance: 19.9 mL/min (by C-G formula based on Cr of 2.69).    Allergies  Allergen Reactions  . Daptomycin Other (See Comments)    Elevated CPK  . Morphine And Related Nausea And Vomiting  . Cephalexin Rash  . Tramadol Nausea And Vomiting  . Atenolol     unknown  . Cephalosporins Other (See Comments)    Unknown  . Dapsone Other (See Comments)    unknown  . Erythromycin     unknown  . Erythromycin Base Other (See Comments)    Unknown  . Celecoxib Other (See Comments)    Unknown, can use furosemide without issue  . Codeine Other (See Comments)    unknown  . Fluoxetine Hcl Other (See Comments)    unknown  . Latex Other (See Comments)  . Ofloxacin Other (See Comments)    unknown  . Penicillins Other (See Comments)    Unknown  Has patient had a PCN reaction causing immediate rash, facial/tongue/throat swelling, SOB or lightheadedness with hypotension: unknown Has patient had a PCN reaction causing severe rash involving mucus membranes or skin necrosis: unknown Has patient had a PCN reaction that required hospitalization unknown Has patient had a PCN reaction occurring within  the last 10 years: unknown If all of the above answers are "NO", then may proceed with Cephalosporin use.  [name] Rofecoxib Other (See Comments)    unknown  . Sulfonamide Derivatives Other (See Comments)    unknown    Antimicrobials this admission: Aztreonam 7/19 >>  Microbiology results: 7/19 BCx: pending 7/18UCx: pending 7/15 BCx: GPR - nothing detected on BCID - follow 7/16 MRSA screen: Neg 7/15 UCx: Mult spec.  Thank you for allowing pharmacy to be a part of this patient's care.  Stephens November, PharmD Clinical Pharmacist  9:51 AM, 06/26/2016

## 2016-06-26 NOTE — Progress Notes (Signed)
Call doctor and notify of SVT

## 2016-06-26 NOTE — Progress Notes (Addendum)
   06/26/16 1817  Vitals  BP 118/79 mmHg  MAP (mmHg) 86  Pulse Rate (!) 162  ECG Heart Rate (!) 162  Resp (!) 21  Oxygen Therapy  SpO2 96 %  paged dr Ethelle Lyon regarding pt in SVT, attempted valsalva maneuver. New orders rec'd

## 2016-06-26 NOTE — Progress Notes (Signed)
Subjective: Continues to improve  Exam: Filed Vitals:   06/26/16 1113 06/26/16 1202  BP: 124/45 118/38  Pulse: 72 73  Temp:  98.6 F (37 C)  Resp: 18 17   Gen: In bed, NAD Resp: non-labored breathing, no acute distress Abd: soft, nt  Neuro: MS: Awake, able to tell me more today, less perseveration, more conversant.  CN: She is able to cross midline bilaterally.  Motor: Appears to move the right side more than the left, but does not cooperate for formal testing Sensory: Responds to noxious stimuli in all 4 extremities  Pertinent Labs: AKI  Impression: 65 year old female with left-sided weakness and right-sided PLEDs following prolonged seizure. MRI could be indicative of prolonged status, but also possible and I think more likley is PRES for which she is cerrtainly at risk given fluctuating BP, renal failure.   Recommendations: 1) continue vimpat 2) continue depakote 500mg  TID 3) change keppra due to renal function, 500mg  BID 4) change all AEDs to oral.   Roland Rack, MD [facility] [phone]  If 7pm- 7am, please page neurology on call as listed in [place].

## 2016-06-27 LAB — BLOOD CULTURE ID PANEL (REFLEXED)
ACINETOBACTER BAUMANNII: NOT DETECTED
CANDIDA GLABRATA: NOT DETECTED
CANDIDA KRUSEI: NOT DETECTED
Candida albicans: NOT DETECTED
Candida parapsilosis: NOT DETECTED
Candida tropicalis: NOT DETECTED
Carbapenem resistance: NOT DETECTED
ENTEROBACTER CLOACAE COMPLEX: NOT DETECTED
ENTEROBACTERIACEAE SPECIES: NOT DETECTED
ENTEROCOCCUS SPECIES: NOT DETECTED
ESCHERICHIA COLI: NOT DETECTED
HAEMOPHILUS INFLUENZAE: NOT DETECTED
Klebsiella oxytoca: NOT DETECTED
Klebsiella pneumoniae: NOT DETECTED
Listeria monocytogenes: NOT DETECTED
METHICILLIN RESISTANCE: DETECTED — AB
NEISSERIA MENINGITIDIS: NOT DETECTED
PSEUDOMONAS AERUGINOSA: NOT DETECTED
Proteus species: NOT DETECTED
STAPHYLOCOCCUS SPECIES: DETECTED — AB
STREPTOCOCCUS AGALACTIAE: NOT DETECTED
STREPTOCOCCUS PYOGENES: NOT DETECTED
STREPTOCOCCUS SPECIES: NOT DETECTED
Serratia marcescens: NOT DETECTED
Staphylococcus aureus (BCID): NOT DETECTED
Streptococcus pneumoniae: NOT DETECTED
Vancomycin resistance: NOT DETECTED

## 2016-06-27 LAB — CULTURE, BLOOD (ROUTINE X 2)

## 2016-06-27 LAB — CBC
HCT: 26.5 % — ABNORMAL LOW (ref 36.0–46.0)
Hemoglobin: 8.4 g/dL — ABNORMAL LOW (ref 12.0–15.0)
MCH: 28.6 pg (ref 26.0–34.0)
MCHC: 31.7 g/dL (ref 30.0–36.0)
MCV: 90.1 fL (ref 78.0–100.0)
Platelets: 148 10*3/uL — ABNORMAL LOW (ref 150–400)
RBC: 2.94 MIL/uL — ABNORMAL LOW (ref 3.87–5.11)
RDW: 15.9 % — AB (ref 11.5–15.5)
WBC: 8.8 10*3/uL (ref 4.0–10.5)

## 2016-06-27 LAB — BASIC METABOLIC PANEL
ANION GAP: 6 (ref 5–15)
BUN: 37 mg/dL — ABNORMAL HIGH (ref 6–20)
CALCIUM: 7.8 mg/dL — AB (ref 8.9–10.3)
CO2: 18 mmol/L — ABNORMAL LOW (ref 22–32)
CREATININE: 2.62 mg/dL — AB (ref 0.44–1.00)
Chloride: 118 mmol/L — ABNORMAL HIGH (ref 101–111)
GFR, EST AFRICAN AMERICAN: 21 mL/min — AB (ref >60)
GFR, EST NON AFRICAN AMERICAN: 18 mL/min — AB (ref >60)
Glucose, Bld: 99 mg/dL (ref 65–99)
Potassium: 4.3 mmol/L (ref 3.5–5.1)
SODIUM: 142 mmol/L (ref 135–145)

## 2016-06-27 LAB — VALPROIC ACID LEVEL: VALPROIC ACID LVL: 40 ug/mL — AB (ref 50.0–100.0)

## 2016-06-27 MED ORDER — VANCOMYCIN HCL IN DEXTROSE 1-5 GM/200ML-% IV SOLN
1000.0000 mg | Freq: Once | INTRAVENOUS | Status: AC
  Administered 2016-06-27: 1000 mg via INTRAVENOUS
  Filled 2016-06-27: qty 200

## 2016-06-27 MED ORDER — METOPROLOL TARTRATE 25 MG PO TABS
25.0000 mg | ORAL_TABLET | Freq: Two times a day (BID) | ORAL | Status: DC
  Administered 2016-06-27 – 2016-06-29 (×5): 25 mg via ORAL
  Filled 2016-06-27 (×5): qty 1

## 2016-06-27 MED ORDER — VANCOMYCIN HCL IN DEXTROSE 750-5 MG/150ML-% IV SOLN
750.0000 mg | INTRAVENOUS | Status: DC
  Administered 2016-06-28 – 2016-06-29 (×2): 750 mg via INTRAVENOUS
  Filled 2016-06-27 (×3): qty 150

## 2016-06-27 MED ORDER — SODIUM BICARBONATE 650 MG PO TABS
650.0000 mg | ORAL_TABLET | Freq: Two times a day (BID) | ORAL | Status: DC
  Administered 2016-06-27 – 2016-06-29 (×5): 650 mg via ORAL
  Filled 2016-06-27 (×5): qty 1

## 2016-06-27 NOTE — Progress Notes (Signed)
Subjective: Continues to improve  Exam: Filed Vitals:   06/27/16 1200 06/27/16 1537  BP: 128/50 124/50  Pulse: 70 73  Temp:  98.1 F (36.7 C)  Resp: 22 20   Gen: In bed, NAD Resp: non-labored breathing, no acute distress Abd: soft, nt  Neuro: MS: sleepy today, but once awake will answer questions and follow commands.   CN: She is able to cross midline bilaterally. R hemianopia Motor: Appears to move both sides equally today, but is weak diffusely.  Sensory: Responds to noxious stimuli in all 4 extremities  Pertinent Labs: AKI  Impression: 65 year old female with left-sided weakness and right-sided PLEDs following prolonged seizure. MRI could be indicative of prolonged status, but also possible and I think more likley is PRES for which she is cerrtainly at risk given fluctuating BP, renal failure. PRES would have a better prognosis.   Recommendations: 1) continue vimpat 2) continue depakote 500mg  TID, level again tomorrow.  3) continue keppra 500mg  BID.  4) change all AEDs to oral.    Roland Rack, MD [facility] [phone]  If 7pm- 7am, please page neurology on call as listed in [place].

## 2016-06-27 NOTE — Consult Note (Signed)
[facility] for Infectious Disease    Date of Admission:  06/22/2016   Total days of antibiotics 2        Vancomycin and aztreonam       Reason for Consult: Coagulase negative staphylococci in blood cultures    Referring Physician: Niel Hummer Primary Care Physician: Seward Carol  Principal Problem:   Status epilepticus [facility]) Active Problems:   PAF (paroxysmal atrial fibrillation) (HCC)   Chronic combined systolic and diastolic CHF (congestive heart failure) (HCC)   SOB (shortness of breath)   Total self-care deficit   AKI (acute kidney injury) ([place])   CKD (chronic kidney disease) stage 3, GFR 30-59 ml/min   Malignant HTN with heart disease, w/o CHF, with chronic kidney disease   SVT (supraventricular tachycardia) (HCC)   Hematoma of left lower extremity   Pericardial effusion   Acute kidney injury superimposed on CKD ([place])   PRES (posterior reversible encephalopathy syndrome): Possible   Malnutrition of moderate degree   . sodium chloride   Intravenous Once  . allopurinol  100 mg Oral Daily  . amLODipine  10 mg Oral QHS  . atorvastatin  20 mg Oral QHS  . aztreonam  500 mg Intravenous Q8H  . divalproex  500 mg Oral Q8H  . famotidine  20 mg Oral Daily  . hydrALAZINE  100 mg Oral Q8H  . isosorbide dinitrate  30 mg Oral BID  . lacosamide  100 mg Oral BID  . levETIRAcetam  500 mg Oral BID  . metoCLOPramide (REGLAN) injection  5 mg Intravenous Q8H  . metoprolol tartrate  25 mg Oral BID  . polyethylene glycol  17 g Oral BID  . sodium bicarbonate  650 mg Oral BID  . sodium chloride flush  3 mL Intravenous Q12H  . traZODone  50 mg Oral QHS  . [START ON 06/28/2016] vancomycin  750 mg Intravenous Q24H    Recommendations: 1. Consider discontinuing vancomycin and aztreonam tomorrow  Assessment: Coagulase negative staphyloccocal bacteremia: [name] has 2/2 blood cultures positive but clinically she does not have much evidence of an infection. She is  afebrile except after a witnessed seizure and not septic. Her blood cultures were also initially positive for diptheroids that are not commonly virulent organism. She may be having brief episodes of transient bacteremia possibly from IV access sites, aspiration events, or her pretty mild skin pressure ulcer on the LLE. Given her overall picture though I don't think there is a clinically significant infection at this time. She has no artificial surfaces making this a high risk of complicated infection if one is still present. If there is an infection it is probably not a major factor in her overall prognosis now. We can continue antibiotics briefly in case of a transient bacteremia but would not continue for a full treatment course and are unlikely to offer much benefit.  Bacteruria: Polymicrobial then klebsiella bacteruria with a benign UA and no focal symptoms and no fevers at this time. Treating this is not likely to offer the patient much benefit so we could stop after 3 days treatment unless she has new symptoms.  HPI: [name] is a 65 y.o. female with a medical history detailed below who is severely debilitated residing at a nursing facility for years requiring increasing assistance for all ADLs who presented to the ED after becoming confused then having generalized seizure 5 days ago. She was seen for seizures in January of this year  but does not follow with a neurologist. She had not noticed feeling sick leading up to the event. She had dyspnea and a transient fever up to 100.8 the first night here following her seizure. Blood and urine cultures were checked growing diptheroids in 2/2 of blood and polymicrobial urine with a benign UA. Repeat blood cultures are growing methicillin resistant coagulase negative staph and klebsiella in urine.   Review of Systems: Review of Systems  Constitutional: Positive for fever.  Respiratory: Negative for cough.   Cardiovascular: Negative for chest pain.    Gastrointestinal: Negative for vomiting.  Genitourinary: Negative for dysuria.  Musculoskeletal: Negative for back pain.  Skin: Negative for itching and rash.  Neurological: Positive for seizures. Negative for headaches.  Endo/Heme/Allergies: Does not bruise/bleed easily.  Psychiatric/Behavioral: The patient has insomnia.     Past Medical History  Diagnosis Date  . Asthma   . Bronchitis   . Allergic rhinitis   . HTN (hypertension)   . Hyperlipidemia   . Obesity   . OSA (obstructive sleep apnea)     poor compliance with cpap  . CHF (congestive heart failure) ([place])   . Biventricular failure ([place])     compensated  . Psoriasis   . Stasis edema     of lower extremities  . Depression   . Situational stress   . Anemia   . Gout   . Chronic anticoagulation     off due to GIB, thigh hematoma  . Yeast infection involving the vagina and surrounding area   . Atrial fibrillation with RVR ([place])   . GERD (gastroesophageal reflux disease)   . Chronic bronchitis ([place])     "get it q yr" (08/10/2013)  . Shortness of breath     "all the time" (08/10/2013)  . On home oxygen therapy     "2L during the night and prn during the day" (08/10/2013)  . Type II diabetes mellitus ([place])   . History of blood transfusion     "few during my lifetime; last time was 4 days ago when I got 2 units" (08/10/2013)  . Stroke [facility]) ?2007    denies residuals on 08/10/2013  . DJD (degenerative joint disease)   . Bleeding     "from my skin folds; just won't stop" (08/10/2013)  . Complication of anesthesia     "they gave me too much; I stayed out of it for awhile" (08/10/2013)  . Anxiety   . Kidney stones   . Kidney disease, chronic, stage III (GFR 30-59 ml/min)   . Chronic kidney disease, stage IV (severe) ([place])   . Morbid obesity ([place])   . Diastolic CHF ([place])   . Pneumonia     "said I did on 08/03/2013 then they ruled it out" (08/10/2013)  . HCAP (healthcare-associated pneumonia) 01/03/2016  . Seizures ([place])      Social History  Substance Use Topics  . Smoking status: Former Smoker -- 0.12 packs/day for 12 years    Types: Cigarettes    Quit date: 07/05/1982  . Smokeless tobacco: Former Systems developer     Comment: smoked for 1.5 yrs about 2 cigs a day ,only if stressed  . Alcohol Use: No    Family History  Problem Relation Age of Onset  . Heart attack Father   . Asthma Father   . Heart disease Paternal Uncle   . Rectal cancer Paternal Aunt   . Other Mother     mva   Allergies  Allergen Reactions  .  Daptomycin Other (See Comments)    Elevated CPK  . Morphine And Related Nausea And Vomiting  . Cephalexin Rash  . Tramadol Nausea And Vomiting  . Atenolol     unknown  . Cephalosporins Other (See Comments)    Unknown  . Dapsone Other (See Comments)    unknown  . Erythromycin     unknown  . Erythromycin Base Other (See Comments)    Unknown  . Celecoxib Other (See Comments)    Unknown, can use furosemide without issue  . Codeine Other (See Comments)    unknown  . Fluoxetine Hcl Other (See Comments)    unknown  . Latex Other (See Comments)  . Ofloxacin Other (See Comments)    unknown  . Penicillins Other (See Comments)    Unknown  Has patient had a PCN reaction causing immediate rash, facial/tongue/throat swelling, SOB or lightheadedness with hypotension: unknown Has patient had a PCN reaction causing severe rash involving mucus membranes or skin necrosis: unknown Has patient had a PCN reaction that required hospitalization unknown Has patient had a PCN reaction occurring within the last 10 years: unknown If all of the above answers are "NO", then may proceed with Cephalosporin use.  [name] Rofecoxib Other (See Comments)    unknown  . Sulfonamide Derivatives Other (See Comments)    unknown    OBJECTIVE: Blood pressure 128/50, pulse 70, temperature 98.8 F (37.1 C), temperature source Oral, resp. rate 22, height 5\' 3"  (1.6 m), weight 74.3 kg (163 lb 12.8 oz), SpO2 96 %.  Physical  Exam GENERAL- Chronically ill appearing woman, awake and alert but not very interactive CARDIAC- RRR, no murmur RESP- CTAB, no wheezes or crackles ABDOMEN- Soft, nontender NEURO- Not very cooperative with examination EXTREMITIES- Left leg s/p surgery, skin grafting, low stage heel pressure ulcer SKIN- Warm, dry, No rash PSYCH- Very flat affect, not much interaction, not speaking much    Lab Results Lab Results  Component Value Date   WBC 8.8 06/27/2016   HGB 8.4* 06/27/2016   HCT 26.5* 06/27/2016   MCV 90.1 06/27/2016   PLT 148* 06/27/2016    Lab Results  Component Value Date   CREATININE 2.62* 06/27/2016   BUN 37* 06/27/2016   NA 142 06/27/2016   K 4.3 06/27/2016   CL 118* 06/27/2016   CO2 18* 06/27/2016    Lab Results  Component Value Date   ALT 7* 06/24/2016   AST 15 06/24/2016   ALKPHOS 86 06/24/2016   BILITOT 0.7 06/24/2016     Microbiology: Recent Results (from the past 240 hour(s))  Blood Culture (routine x 2)     Status: Abnormal   Collection Time: 06/22/16  2:01 PM  Result Value Ref Range Status   Specimen Description BLOOD LEFT WRIST  Final   Special Requests BOTTLES DRAWN AEROBIC ONLY 5CC  Final   Culture  Setup Time   Final    GRAM POSITIVE RODS AEROBIC BOTTLE ONLY CRITICAL VALUE NOTED.  VALUE IS CONSISTENT WITH PREVIOUSLY REPORTED AND CALLED VALUE.    Culture (A)  Final    DIPHTHEROIDS(CORYNEBACTERIUM SPECIES) Standardized susceptibility testing for this organism is not available.    Report Status 06/27/2016 FINAL  Final  Blood Culture (routine x 2)     Status: Abnormal   Collection Time: 06/22/16  2:05 PM  Result Value Ref Range Status   Specimen Description BLOOD LEFT HAND  Final   Special Requests BOTTLES DRAWN AEROBIC ONLY 5CC  Final   Culture  Setup Time  Final    GRAM POSITIVE RODS AEROBIC BOTTLE ONLY CRITICAL RESULT CALLED TO, READ BACK BY AND VERIFIED WITH: M TURNER,PHARMD AT 1522 06/24/16 BY L BENFIELD    Culture (A)  Final     DIPHTHEROIDS(CORYNEBACTERIUM SPECIES) Standardized susceptibility testing for this organism is not available.    Report Status 06/26/2016 FINAL  Final  Blood Culture ID Panel (Reflexed)     Status: None   Collection Time: 06/22/16  2:05 PM  Result Value Ref Range Status   Enterococcus species NOT DETECTED NOT DETECTED Final   Vancomycin resistance NOT DETECTED NOT DETECTED Final   Listeria monocytogenes NOT DETECTED NOT DETECTED Final   Staphylococcus species NOT DETECTED NOT DETECTED Final   Staphylococcus aureus NOT DETECTED NOT DETECTED Final   Methicillin resistance NOT DETECTED NOT DETECTED Final   Streptococcus species NOT DETECTED NOT DETECTED Final   Streptococcus agalactiae NOT DETECTED NOT DETECTED Final   Streptococcus pneumoniae NOT DETECTED NOT DETECTED Final   Streptococcus pyogenes NOT DETECTED NOT DETECTED Final   Acinetobacter baumannii NOT DETECTED NOT DETECTED Final   Enterobacteriaceae species NOT DETECTED NOT DETECTED Final   Enterobacter cloacae complex NOT DETECTED NOT DETECTED Final   Escherichia coli NOT DETECTED NOT DETECTED Final   Klebsiella oxytoca NOT DETECTED NOT DETECTED Final   Klebsiella pneumoniae NOT DETECTED NOT DETECTED Final   Proteus species NOT DETECTED NOT DETECTED Final   Serratia marcescens NOT DETECTED NOT DETECTED Final   Carbapenem resistance NOT DETECTED NOT DETECTED Final   Haemophilus influenzae NOT DETECTED NOT DETECTED Final   Neisseria meningitidis NOT DETECTED NOT DETECTED Final   Pseudomonas aeruginosa NOT DETECTED NOT DETECTED Final   Candida albicans NOT DETECTED NOT DETECTED Final   Candida glabrata NOT DETECTED NOT DETECTED Final   Candida krusei NOT DETECTED NOT DETECTED Final   Candida parapsilosis NOT DETECTED NOT DETECTED Final   Candida tropicalis NOT DETECTED NOT DETECTED Final  Urine culture     Status: Abnormal   Collection Time: 06/22/16  3:52 PM  Result Value Ref Range Status   Specimen Description URINE,  CATHETERIZED  Final   Special Requests NONE  Final   Culture MULTIPLE SPECIES PRESENT, SUGGEST RECOLLECTION (A)  Final   Report Status 06/23/2016 FINAL  Final  MRSA PCR Screening     Status: None   Collection Time: 06/23/16  1:03 AM  Result Value Ref Range Status   MRSA by PCR NEGATIVE NEGATIVE Final    Comment:        The GeneXpert MRSA Assay (FDA approved for NASAL specimens only), is one component of a comprehensive MRSA colonization surveillance program. It is not intended to diagnose MRSA infection nor to guide or monitor treatment for MRSA infections.   Urine culture     Status: Abnormal (Preliminary result)   Collection Time: 06/25/16  6:11 PM  Result Value Ref Range Status   Specimen Description URINE, CATHETERIZED  Final   Special Requests NONE  Final   Culture (A)  Final    >=100,000 COLONIES/mL KLEBSIELLA PNEUMONIAE SUSCEPTIBILITIES TO FOLLOW    Report Status PENDING  Incomplete  Culture, blood (routine x 2)     Status: None (Preliminary result)   Collection Time: 06/26/16 10:00 AM  Result Value Ref Range Status   Specimen Description BLOOD LEFT HAND  Final   Special Requests BOTTLES DRAWN AEROBIC ONLY 5CC  Final   Culture  Setup Time   Final    GRAM POSITIVE COCCI IN CLUSTERS AEROBIC BOTTLE ONLY Organism  ID to follow CRITICAL RESULT CALLED TO, READ BACK BY AND VERIFIED WITH: Wynona Neat, PHARM AT [place] ON [id number] BY M. KELLY    Culture NO GROWTH 1 DAY  Final   Report Status PENDING  Incomplete  Blood Culture ID Panel (Reflexed)     Status: Abnormal   Collection Time: 06/26/16 10:00 AM  Result Value Ref Range Status   Enterococcus species NOT DETECTED NOT DETECTED Final   Vancomycin resistance NOT DETECTED NOT DETECTED Final   Listeria monocytogenes NOT DETECTED NOT DETECTED Final   Staphylococcus species DETECTED (A) NOT DETECTED Final    Comment: CRITICAL RESULT CALLED TO, READ BACK BY AND VERIFIED WITH: VERONDA BRYK,PHARMD @0635  06/27/16 MKELLY     Staphylococcus aureus NOT DETECTED NOT DETECTED Final   Methicillin resistance DETECTED (A) NOT DETECTED Final    Comment: CRITICAL RESULT CALLED TO, READ BACK BY AND VERIFIED WITH: VERONDA BRYK, PHARMD @0635  06/27/16 MKELLY    Streptococcus species NOT DETECTED NOT DETECTED Final   Streptococcus agalactiae NOT DETECTED NOT DETECTED Final   Streptococcus pneumoniae NOT DETECTED NOT DETECTED Final   Streptococcus pyogenes NOT DETECTED NOT DETECTED Final   Acinetobacter baumannii NOT DETECTED NOT DETECTED Final   Enterobacteriaceae species NOT DETECTED NOT DETECTED Final   Enterobacter cloacae complex NOT DETECTED NOT DETECTED Final   Escherichia coli NOT DETECTED NOT DETECTED Final   Klebsiella oxytoca NOT DETECTED NOT DETECTED Final   Klebsiella pneumoniae NOT DETECTED NOT DETECTED Final   Proteus species NOT DETECTED NOT DETECTED Final   Serratia marcescens NOT DETECTED NOT DETECTED Final   Carbapenem resistance NOT DETECTED NOT DETECTED Final   Haemophilus influenzae NOT DETECTED NOT DETECTED Final   Neisseria meningitidis NOT DETECTED NOT DETECTED Final   Pseudomonas aeruginosa NOT DETECTED NOT DETECTED Final   Candida albicans NOT DETECTED NOT DETECTED Final   Candida glabrata NOT DETECTED NOT DETECTED Final   Candida krusei NOT DETECTED NOT DETECTED Final   Candida parapsilosis NOT DETECTED NOT DETECTED Final   Candida tropicalis NOT DETECTED NOT DETECTED Final  Culture, blood (routine x 2)     Status: None (Preliminary result)   Collection Time: 06/26/16 10:10 AM  Result Value Ref Range Status   Specimen Description BLOOD RIGHT HAND  Final   Special Requests BOTTLES DRAWN AEROBIC ONLY 5CC  Final   Culture  Setup Time   Final    GRAM POSITIVE COCCI IN CLUSTERS AEROBIC BOTTLE ONLY CRITICAL RESULT CALLED TO, READ BACK BY AND VERIFIED WITH: Karlene Einstein, PHARM AT [place]    Culture NO GROWTH 1 DAY  Final   Report Status PENDING  Incomplete     Collier Salina, MD PGY-II Internal Medicine Resident Pager# [phone] 06/27/2016, 3:26 PM

## 2016-06-27 NOTE — Progress Notes (Signed)
[id number] RN Liaison Note   This is a GIP, related, covered hospital admission of 06/22/16 per Dr. Shelbie Proctor, with a [facility] Dx of cerebrovascular disease. Visited  Patient at bedside.  Husband is present and son is sleeping in recliner in room.   Patient asleep and did not arouse during visit.  Husband is speaking very openly and states that with everything that is going on with his wife, he doesn't see her ever leaving the hospital. Husband verbalized to me that "he doesn't want her to suffer anymore".  Code status was changed yesterday to PARTIAL CODE.   Her meds have been changed to oral and are being crushed and given with applesauce.  PO intake documented at 25% in past 24 hours.   She continues to have weakness of both  Upper extremities and is not able to feed herself as was her baseline prior to this admission.  She is off of oxygen.  Patient had a VQ scan performed yesterday which was normal, however patient did have a seizure and episode of SVT yesterday.  Blood cultures are positive for MRSA, GNR growing in urine per chart review.  Currently receiving Vancomycin 750 mg IV q24 h.  She continues on anti-seizure meds; Keppra, Depakote and Azactam po.  BP is being managed with Norvasc, Hydralazine,  and Isosorbide.     Emotional support provided to spouse. [facility] will continue to follow patient until discharge and anticipate any discharge needs.  Thank you!   Mickie Kay, [facility] Liaison  [phone]

## 2016-06-27 NOTE — Progress Notes (Signed)
PHARMACY - PHYSICIAN COMMUNICATION CRITICAL VALUE ALERT - BLOOD CULTURE IDENTIFICATION (BCID)  Results for orders placed or performed during the hospital encounter of 06/22/16  Blood Culture ID Panel (Reflexed) (Collected: 06/26/2016 10:00 AM)  Result Value Ref Range   Enterococcus species NOT DETECTED NOT DETECTED   Vancomycin resistance NOT DETECTED NOT DETECTED   Listeria monocytogenes NOT DETECTED NOT DETECTED   Staphylococcus species DETECTED (A) NOT DETECTED   Staphylococcus aureus NOT DETECTED NOT DETECTED   Methicillin resistance DETECTED (A) NOT DETECTED   Streptococcus species NOT DETECTED NOT DETECTED   Streptococcus agalactiae NOT DETECTED NOT DETECTED   Streptococcus pneumoniae NOT DETECTED NOT DETECTED   Streptococcus pyogenes NOT DETECTED NOT DETECTED   Acinetobacter baumannii NOT DETECTED NOT DETECTED   Enterobacteriaceae species NOT DETECTED NOT DETECTED   Enterobacter cloacae complex NOT DETECTED NOT DETECTED   Escherichia coli NOT DETECTED NOT DETECTED   Klebsiella oxytoca NOT DETECTED NOT DETECTED   Klebsiella pneumoniae NOT DETECTED NOT DETECTED   Proteus species NOT DETECTED NOT DETECTED   Serratia marcescens NOT DETECTED NOT DETECTED   Carbapenem resistance NOT DETECTED NOT DETECTED   Haemophilus influenzae NOT DETECTED NOT DETECTED   Neisseria meningitidis NOT DETECTED NOT DETECTED   Pseudomonas aeruginosa NOT DETECTED NOT DETECTED   Candida albicans NOT DETECTED NOT DETECTED   Candida glabrata NOT DETECTED NOT DETECTED   Candida krusei NOT DETECTED NOT DETECTED   Candida parapsilosis NOT DETECTED NOT DETECTED   Candida tropicalis NOT DETECTED NOT DETECTED    Name of physician (or Provider) Contacted: Dr. Tyrell Antonio  65 yo F with 2 aerobic bottles drawn from different sites now positive with CoNS that is methicillin resistant. Also has GNRs growing in her urine. May still be contaminant but should most likely continue vancomycin for now and repeat blood cx's  soon. MD will plan to consult ID for further recommendations.  Changes to prescribed antibiotics required: Continue current treatment with vancomycin and aztreonam  Elenor Quinones, PharmD, BCPS Clinical Pharmacist Pager [phone] 06/27/2016 8:10 AM

## 2016-06-27 NOTE — Progress Notes (Signed)
Speech Language Pathology Treatment: Dysphagia  Patient Details Name: [name] MRN: [id number] DOB: 1951-01-03 Today's Date: 06/27/2016 Time: [id number] SLP Time Calculation (min) (ACUTE ONLY): 25 min  Assessment / Plan / Recommendation Clinical Impression  Dysphagia treatment provided for diet tolerance/ consideration of advancing diet. During treatment, pt was initially alert and responsive but began to have waxing/ waning alertness as session progressed. Pt tolerated trials of puree/ nectar thick liquids without overt s/s of aspiration but was noted to have minimal residuals scattered in oral cavity which pt cleared when cued to swallow again. Trial of thin liquid resulted in an immediate cough. Pt was total assist for feeding- attempted hand-over-hand with cup but no effort from pt to bring cup to mouth. Given current mentation/ alertness level, pt continues to be at risk of aspiration of thin liquids and solids. Recommend continuing Dysphagia 1/ nectar thick diet, meds crushed in puree, full supervision to assist with feeding, cue small bites/ sips, monitor alertness level. Will continue to follow for further consideration of advancement.   HPI HPI: [name] is a 65 y.o. 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 She's been living in the nursing home for 3 years now and is full assist for all ADLs, w/ recent dx in 1/17 of seizure disorder, with PMHx of atrial fibrillation, history of hypertension, chronic 2 L n/c at NH, and chronic kidney disease, presents to our hospital for seizure. Chest x ray reveals Mild asymmetric increased opacification within the right upper lobe and right midlung which may reflect underlying aspiration,      SLP Plan  Continue with current plan of care     Recommendations  Diet recommendations: Dysphagia 1 (puree);Nectar-thick liquid Liquids provided via: Straw Medication Administration: Crushed with  puree Supervision: Staff to assist with self feeding;Full supervision/cueing for compensatory strategies Compensations: Other (Comment);Slow rate;Small sips/bites;Minimize environmental distractions (check for oral residuals) Postural Changes and/or Swallow Maneuvers: Seated upright 90 degrees             Oral Care Recommendations: Oral care BID Follow up Recommendations: 24 hour supervision/assistance Plan: Continue with current plan of care     [place], Amy K, [place], CCC-SLP 06/27/2016, 9:23 AM [phone]

## 2016-06-27 NOTE — Care Management Important Message (Signed)
Important Message  Patient Details  Name: [name] MRN: [id number] Date of Birth: 06/01/1951   Medicare Important Message Given:  Yes    Lehman Whiteley Abena 06/27/2016, 11:00 AM

## 2016-06-27 NOTE — Progress Notes (Signed)
PROGRESS NOTE    [name]  [id number] DOB: 15-Nov-1951 DOA: 06/22/2016 PCP: Kandice Hams, MD    Brief Narrative:  Patient is a 65 year old female bedbound for the past 3 years nursing home resident with history of severe debility, atrial fibrillation, hypertension, chronic kidney disease stage III, obstructive sleep apnea, CHF, morbid obesity, history of seizures presented to the ED with status epilepticus. Patient initially loaded with Keppra and placed on IV Keppra twice daily. Patient had also presented with shortness of breath and tachypnea. Due to patient's mobility status, presentation was shortness of breath and tachypnea concern was for PE and a such VQ scan was ordered. On route to VQ scan patient noted to have another episode of seizures and had to be loaded with IV Depakote and IV Keppra and doses of Keppra increased and patient placed on Depakote as well as Vimpat. Patient also on heparin drip. Patient opens eyes to verbal stimuli however not following commands and nonresponsive which her husband is not her baseline. Patient also noted on the night of 06/22/2016 went into SVT requiring adenosine and now on scheduled IV Lopressor. Neurology and cardiology have been consulted. Patient underwent EEG which was consistent with status epilepticus which improved daily. Patient subsequently underwent MRI of the head with findings consistent with status epilepticus versus PRES. goal blood pressure systolic less than [id number]. 2-D echo done did show a mild to moderate pericardial effusion per cardiology.   Assessment & Plan:   Principal Problem:   Status epilepticus ([place]) Active Problems:   PAF (paroxysmal atrial fibrillation) (HCC)   Chronic combined systolic and diastolic CHF (congestive heart failure) (HCC)   SOB (shortness of breath)   Total self-care deficit   AKI (acute kidney injury) ([place])   CKD (chronic kidney disease) stage 3, GFR 30-59 ml/min   Malignant HTN with heart  disease, w/o CHF, with chronic kidney disease   SVT (supraventricular tachycardia) (HCC)   Hematoma of left lower extremity   Pericardial effusion   Acute kidney injury superimposed on CKD ([place])   PRES (posterior reversible encephalopathy syndrome): Possible   Malnutrition of moderate degree  1- status epilepticus VS PRES Patient presented with generalized tonic-clonic seizures after episode of confusion and disorientation. Patient was given a loading dose of Keppra and placed on Keppra 750 mg twice daily. CT head which was done showed prior bilateral occipital ischemic changes. There was also concern that patient may have had arrhythmias which may have led to decreased cerebral perfusion provoking seizures secondary to bilateral occipital ischemic changes noted. The night of 06/22/2016, patient was noted to have episode of seizures on route to VQ scan and received Ativan at that time. Neurology was called and patient was loaded with 1 g of IV Depakote followed by 500 mg of IV Depakote twice daily as well as IV Keppra and Keppra dose increased to 1 g IV twice daily. Patient was also given IV Vimpat and placed on 100 mg of Vimpat twice daily. Patient with no signs of infectious etiology. Patient with low-grade fever this morning and also leukocytosis however these may have likely been secondary to seizure episodes. Leukocytosis trending down. EEG consistent with status epilepticus. Concern for possible acute ischemic CVA and as such MRI of the brain was done 06/24/2016 that showed extensive multifocal T2/FLAIR signal abnormality involving bilateral cerebral hemispheres as well as cerebellum with associated patchy diffusion abnormality likely related to status epilepticus and/or. PRES. No acute vascular infarct.remote bilateral PCA territory infarcts with remote left pontine  lacunar infarct. Underlying mild chronic small vessel ischemic disease.  -Valproic acid level decreased at 29. -Continue Norvasc and  hydralazine and Isordil . -Goal blood pressure systolic less than [id number]. -Continue Vimpat, Keppra,  Depakote. Per neurology.  2 SVT Patient noted to go into SVT the night of 06/22/2016, and had to be given adenosine 6 mg IV 1 with improvement and subsequently placed on scheduled Lopressor.TSH within normal limits at 1.301. 2-D echo with EF of 50-55% with grade 1 diastolic dysfunction and mild to moderate pericardial effusion with no signs of tamponade..Cardiology sign off.  Change lopressor to oral metoprolol. Continue with PRN.   3 mild to moderate pericardial effusion Per 2-D echo. No signs of tamponade. Monitor. Per cardiology.  4 Paroxysmal atrial fibrillation Currently rate controlled on scheduled IV Lopressor. Patient NOT on anticoagulation in the past secondary to history of GI bleed and hematoma. Discontinued IV heparin due to bleeding hematoma noted this morning.   5 Shortness of breath/tachypnea Patient presented with shortness of breath and tachypnea with a history of being bedbound for approximately 3 years. Concern for pulmonary emboli. VQ scan was ordered however on route to VQ scan patient had seizures and a such VQ scan is still pending. D/C IV heparin drip. Repeat chest x-ray 06/23/2016 with no significant change. Follow. 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 Continue scheduled IV Lopressor. Hydralazine as needed.  -Continue with of Norvasc, hydralazine, Isordil .  - Goal systolic blood pressure less than 140. Hydralazine when necessary.  7 Acute on chronic kidney disease stage III Likely secondary to prerenal azotemia. Patient noted to have acute blood loss secondary to hematoma with hemoglobin at 6.5. -renal US negative for hydronephrosis.  -IV fluids. Follow trend.  -Hypernatremia; Continue with  IV fluids to D 5.   8 Leukocytosis, Bacteremia.  UA with too numerous to count WBC.  Will follow urine culture.  Blood culture 7-15 aerobic growing gram positive rods. Considering contaminant.  Continue with aztreonam urine growing gram negative rods. .  Blood culture 7-19 growing 2 out of 2 staph coagulase. Started vancomycin 7-19 ID consulted.   9 combined chronic systolic and diastolic heart failure Currently stable.  10 left lower extremity bleeding hematoma Patient noted to have a bleeding/ruptured hematoma the morning of 06/24/2016 and pressure dressing in place. No further bleeding per nursing. Discontinued IV heparin. Patient with ruptured/bleeding hematoma the morning of 06/24/2016 and as such anticoagulation was discontinued.  Patient with prior history of GI bleed and hematoma and a such on NO anticoagulation for A. fib. Heparin has been discontinued.   11 ABLA Likely secondary to ruptured LLE hematoma. HGB 6.5 from 9.7. S/P  transfuse 2 units PRBCs 7-18. Repeat hb tonight   12 prognosis Patient has been bedridden for the past 3 years with comorbidities admitted with status epilepticus. Patient also noted to go into SVT during the hospitalization. Patient had MRI done with findings concerning for status epilepticus versus PRES. patient indeed has PRES will need tighter blood pressure control with goal systolic blood pressure less than 140. Patient was on hospice prior to admission however was a full code. Discussed with patient's husband in terms of prognosis and depending on neurology's evaluation will see how patient does over the next few days and if no significant improvement or deterioration may consider palliative care consult for goals of care. If patient indeed has PRES will likely take several weeks to see full  recovery and in that case could have palliative care goals of care done in the outpatient setting. -husband would like to continue with current treatments.   DVT prophylaxis: SCD Code Status: Partial code, discussed with husband. He does not wants  patient to be intubated.  Family Communication: Updated husband at bedside.  Disposition Plan: Remain the step down unit. Back to nursing facility when medically stable.   Consultants:   Neurology: Dr. Tasia Catchings 06/22/2016  Cardiology: Dr. Meda Coffee 06/23/2016  Procedures:  EEG 06/23/2016  CT head 06/22/2016  Chest x-ray 06/22/2016, 06/23/2016  MRI 06/24/2016  2 d echo 06/24/2016  2 units PRBC   Antimicrobials:   None   Subjective: Patient is more alert, denies pain, she said I will try to get better   Objective: Filed Vitals:   06/27/16 0325 06/27/16 0400 06/27/16 0556 06/27/16 0707  BP: 135/56 136/50 138/51 129/44  Pulse: 80 77  78  Temp: 98.3 F (36.8 C)   98.5 F (36.9 C)  TempSrc: Axillary   Oral  Resp: 21 21  22   Height:      Weight:      SpO2: 98% 97%  94%    Intake/Output Summary (Last 24 hours) at 06/27/16 0849 Last data filed at 06/27/16 0609  Gross per 24 hour  Intake   1215 ml  Output      0 ml  Net   1215 ml   Filed Weights   06/22/16 1331 06/22/16 1931  Weight: 101.606 kg (224 lb) 74.3 kg (163 lb 12.8 oz)    Examination:  General exam; alert . Following some commands.  Respiratory system: Clear to auscultation anterior lung fields. Respiratory effort normal. Cardiovascular system: Irregularly irregular. No JVD, murmurs, rubs, gallops or clicks. No pedal edema. Gastrointestinal system: Abdomen is nondistended, soft and nontender. No organomegaly or masses felt. Normal bowel sounds heard. Central nervous system: Alert and oriented to self only. Extremities: 3-4/5 RUE strength, 2/5 LUE strength Skin: No rashes, lesions or ulcers. Left lower extremity with pressure dressing status post ruptured hematoma. Psychiatry: Judgement and insight appear poor. Mood & affect flat.     Data Reviewed: I have personally reviewed following labs and imaging studies  CBC:  Recent Labs Lab 06/22/16 1240  06/24/16 0509 06/25/16 0442 06/25/16 0737  06/25/16 0828 06/26/16 0512 06/26/16 1430 06/27/16 0426  WBC 12.1*  < > 10.4 9.2 8.5  --  11.1*  --  8.8  NEUTROABS 9.6*  --   --   --   --   --   --   --   --   HGB 10.2*  < > 9.7* 6.6* 6.5* 6.5* 9.4* 9.4* 8.4*  HCT 32.9*  < > 31.5* 21.4* 21.4* 21.4* 28.8* 29.8* 26.5*  MCV 92.9  < > 91.3 92.6 93.4  --  89.4  --  90.1  PLT 231  < > 218 159 166  --  161  --  148*  < > = values in this interval not displayed. Basic Metabolic Panel:  Recent Labs Lab 06/23/16 0300 06/24/16 0509 06/25/16 0442 06/25/16 0737 06/25/16 0828 06/26/16 0512 06/27/16 0426  NA 143 144 142 144  --  146* 142  K 4.2 3.5 5.2* 4.8 4.7 4.5 4.3  CL 114* 115* 115* 116*  --  116* 118*  CO2 18* 22 17* 20*  --  19* 18*  GLUCOSE 88 94 62* 69  --  81 99  BUN 36* 31* 39* 38*  --  36* 37*  CREATININE 1.72* 1.93* 2.81* 2.85*  --  2.69* 2.62*  CALCIUM 8.3* 8.3* 8.1* 8.0*  --  8.1* 7.8*  MG 1.9 1.9  --   --   --  1.9  --   PHOS 3.6  --   --   --   --  5.2*  --    GFR: Estimated Creatinine Clearance: 20.4 mL/min (by C-G formula based on Cr of 2.62). Liver Function Tests:  Recent Labs Lab 06/22/16 1240 06/23/16 0300 06/24/16 0509 06/26/16 0512  AST 23 20 15   --   ALT 9* 9* 7*  --   ALKPHOS 106 94 86  --   BILITOT 0.7 0.6 0.7  --   PROT 6.3* 5.7* 5.5*  --   ALBUMIN 2.8* 2.3* 2.1* 2.0*   No results for input(s): LIPASE, AMYLASE in the last 168 hours. No results for input(s): AMMONIA in the last 168 hours. Coagulation Profile:  Recent Labs Lab 06/22/16 1240  INR 0.94   Cardiac Enzymes: No results for input(s): CKTOTAL, CKMB, CKMBINDEX, TROPONINI in the last 168 hours. BNP (last 3 results) No results for input(s): PROBNP in the last 8760 hours. HbA1C: No results for input(s): HGBA1C in the last 72 hours. CBG: No results for input(s): GLUCAP in the last 168 hours. Lipid Profile: No results for input(s): CHOL, HDL, LDLCALC, TRIG, CHOLHDL, LDLDIRECT in the last 72 hours. Thyroid Function Tests: No  results for input(s): TSH, T4TOTAL, FREET4, T3FREE, THYROIDAB in the last 72 hours. Anemia Panel: No results for input(s): VITAMINB12, FOLATE, FERRITIN, TIBC, IRON, RETICCTPCT in the last 72 hours. Sepsis Labs:  Recent Labs Lab 06/22/16 1255  LATICACIDVEN 1.44    Recent Results (from the past 240 hour(s))  Blood Culture (routine x 2)     Status: None (Preliminary result)   Collection Time: 06/22/16  2:01 PM  Result Value Ref Range Status   Specimen Description BLOOD LEFT WRIST  Final   Special Requests BOTTLES DRAWN AEROBIC ONLY 5CC  Final   Culture  Setup Time   Final    GRAM POSITIVE RODS AEROBIC BOTTLE ONLY CRITICAL VALUE NOTED.  VALUE IS CONSISTENT WITH PREVIOUSLY REPORTED AND CALLED VALUE.    Culture GRAM POSITIVE RODS  Final   Report Status PENDING  Incomplete  Blood Culture (routine x 2)     Status: Abnormal   Collection Time: 06/22/16  2:05 PM  Result Value Ref Range Status   Specimen Description BLOOD LEFT HAND  Final   Special Requests BOTTLES DRAWN AEROBIC ONLY 5CC  Final   Culture  Setup Time   Final    GRAM POSITIVE RODS AEROBIC BOTTLE ONLY CRITICAL RESULT CALLED TO, READ BACK BY AND VERIFIED WITH: M TURNER,PHARMD AT 1522 06/24/16 BY L BENFIELD    Culture (A)  Final    DIPHTHEROIDS(CORYNEBACTERIUM SPECIES) Standardized susceptibility testing for this organism is not available.    Report Status 06/26/2016 FINAL  Final  Blood Culture ID Panel (Reflexed)     Status: None   Collection Time: 06/22/16  2:05 PM  Result Value Ref Range Status   Enterococcus species NOT DETECTED NOT DETECTED Final   Vancomycin resistance NOT DETECTED NOT DETECTED Final   Listeria monocytogenes NOT DETECTED NOT DETECTED Final   Staphylococcus species NOT DETECTED NOT DETECTED Final   Staphylococcus aureus NOT DETECTED NOT DETECTED Final   Methicillin resistance NOT DETECTED NOT DETECTED Final   Streptococcus species NOT DETECTED NOT DETECTED Final   Streptococcus agalactiae NOT  DETECTED NOT DETECTED Final  Streptococcus pneumoniae NOT DETECTED NOT DETECTED Final   Streptococcus pyogenes NOT DETECTED NOT DETECTED Final   Acinetobacter baumannii NOT DETECTED NOT DETECTED Final   Enterobacteriaceae species NOT DETECTED NOT DETECTED Final   Enterobacter cloacae complex NOT DETECTED NOT DETECTED Final   Escherichia coli NOT DETECTED NOT DETECTED Final   Klebsiella oxytoca NOT DETECTED NOT DETECTED Final   Klebsiella pneumoniae NOT DETECTED NOT DETECTED Final   Proteus species NOT DETECTED NOT DETECTED Final   Serratia marcescens NOT DETECTED NOT DETECTED Final   Carbapenem resistance NOT DETECTED NOT DETECTED Final   Haemophilus influenzae NOT DETECTED NOT DETECTED Final   Neisseria meningitidis NOT DETECTED NOT DETECTED Final   Pseudomonas aeruginosa NOT DETECTED NOT DETECTED Final   Candida albicans NOT DETECTED NOT DETECTED Final   Candida glabrata NOT DETECTED NOT DETECTED Final   Candida krusei NOT DETECTED NOT DETECTED Final   Candida parapsilosis NOT DETECTED NOT DETECTED Final   Candida tropicalis NOT DETECTED NOT DETECTED Final  Urine culture     Status: Abnormal   Collection Time: 06/22/16  3:52 PM  Result Value Ref Range Status   Specimen Description URINE, CATHETERIZED  Final   Special Requests NONE  Final   Culture MULTIPLE SPECIES PRESENT, SUGGEST RECOLLECTION (A)  Final   Report Status 06/23/2016 FINAL  Final  MRSA PCR Screening     Status: None   Collection Time: 06/23/16  1:03 AM  Result Value Ref Range Status   MRSA by PCR NEGATIVE NEGATIVE Final    Comment:        The GeneXpert MRSA Assay (FDA approved for NASAL specimens only), is one component of a comprehensive MRSA colonization surveillance program. It is not intended to diagnose MRSA infection nor to guide or monitor treatment for MRSA infections.   Urine culture     Status: Abnormal (Preliminary result)   Collection Time: 06/25/16  6:11 PM  Result Value Ref Range Status    Specimen Description URINE, CATHETERIZED  Final   Special Requests NONE  Final   Culture >=100,000 COLONIES/mL GRAM NEGATIVE RODS (A)  Final   Report Status PENDING  Incomplete  Culture, blood (routine x 2)     Status: None (Preliminary result)   Collection Time: 06/26/16 10:00 AM  Result Value Ref Range Status   Specimen Description BLOOD LEFT HAND  Final   Special Requests BOTTLES DRAWN AEROBIC ONLY 5CC  Final   Culture  Setup Time   Final    GRAM POSITIVE COCCI IN CLUSTERS AEROBIC BOTTLE ONLY Organism ID to follow CRITICAL RESULT CALLED TO, READ BACK BY AND VERIFIED WITH: Wynona Neat, PHARM AT [id number] ON [id number] BY Craig Guess    Culture PENDING  Incomplete   Report Status PENDING  Incomplete  Blood Culture ID Panel (Reflexed)     Status: Abnormal   Collection Time: 06/26/16 10:00 AM  Result Value Ref Range Status   Enterococcus species NOT DETECTED NOT DETECTED Final   Vancomycin resistance NOT DETECTED NOT DETECTED Final   Listeria monocytogenes NOT DETECTED NOT DETECTED Final   Staphylococcus species DETECTED (A) NOT DETECTED Final    Comment: CRITICAL RESULT CALLED TO, READ BACK BY AND VERIFIED WITH: VERONDA BRYK,PHARMD @0635  06/27/16 MKELLY    Staphylococcus aureus NOT DETECTED NOT DETECTED Final   Methicillin resistance DETECTED (A) NOT DETECTED Final    Comment: CRITICAL RESULT CALLED TO, READ BACK BY AND VERIFIED WITH: VERONDA BRYK, PHARMD @0635  06/27/16 MKELLY    Streptococcus species NOT DETECTED NOT DETECTED  Final   Streptococcus agalactiae NOT DETECTED NOT DETECTED Final   Streptococcus pneumoniae NOT DETECTED NOT DETECTED Final   Streptococcus pyogenes NOT DETECTED NOT DETECTED Final   Acinetobacter baumannii NOT DETECTED NOT DETECTED Final   Enterobacteriaceae species NOT DETECTED NOT DETECTED Final   Enterobacter cloacae complex NOT DETECTED NOT DETECTED Final   Escherichia coli NOT DETECTED NOT DETECTED Final   Klebsiella oxytoca NOT DETECTED NOT DETECTED Final    Klebsiella pneumoniae NOT DETECTED NOT DETECTED Final   Proteus species NOT DETECTED NOT DETECTED Final   Serratia marcescens NOT DETECTED NOT DETECTED Final   Carbapenem resistance NOT DETECTED NOT DETECTED Final   Haemophilus influenzae NOT DETECTED NOT DETECTED Final   Neisseria meningitidis NOT DETECTED NOT DETECTED Final   Pseudomonas aeruginosa NOT DETECTED NOT DETECTED Final   Candida albicans NOT DETECTED NOT DETECTED Final   Candida glabrata NOT DETECTED NOT DETECTED Final   Candida krusei NOT DETECTED NOT DETECTED Final   Candida parapsilosis NOT DETECTED NOT DETECTED Final   Candida tropicalis NOT DETECTED NOT DETECTED Final  Culture, blood (routine x 2)     Status: None (Preliminary result)   Collection Time: 06/26/16 10:10 AM  Result Value Ref Range Status   Specimen Description BLOOD RIGHT HAND  Final   Special Requests BOTTLES DRAWN AEROBIC ONLY 5CC  Final   Culture  Setup Time   Final    GRAM POSITIVE COCCI IN CLUSTERS AEROBIC BOTTLE ONLY CRITICAL RESULT CALLED TO, READ BACK BY AND VERIFIED WITH: Karlene Einstein, PHARM AT [place] ON UA:[id number] BY Rhea Bleacher    Culture PENDING  Incomplete   Report Status PENDING  Incomplete         Radiology Studies: US Renal  06/25/2016  CLINICAL DATA:  Patient with acute renal failure. Elevated BUN and creatinine on laboratory analysis. EXAM: RENAL / URINARY TRACT ULTRASOUND COMPLETE COMPARISON:  Ultrasound present renal ultrasound 08/11/2013 FINDINGS: Right Kidney: Length: 9.2 cm. Renal cortical thinning. Increased renal cortical echogenicity. Multiple hypoechoic masses most compatible with cyst measuring up to 1.8 cm. No hydronephrosis. Left Kidney: Length: 12.9 cm. Normal renal cortical thickness and echogenicity. No hydronephrosis. Off of the inferior pole of the left kidney there is a 3.4 cm cyst. Bladder: Not well visualized. Incidental note is made of a right pleural effusion. IMPRESSION: No hydronephrosis. Atrophic right kidney.  Electronically Signed   By: Lovey Newcomer M.D.   On: 06/25/2016 15:55   Nm Pulmonary Perf And Vent  06/26/2016  CLINICAL DATA:  Shortness of breath. EXAM: NUCLEAR MEDICINE VENTILATION - PERFUSION LUNG SCAN TECHNIQUE: Ventilation images were obtained in multiple projections using inhaled aerosol Tc-82m DTPA. Perfusion images were obtained in multiple projections after intravenous injection of Tc-41m MAA. RADIOPHARMACEUTICALS:  Thirty-three MCi Technetium-8m DTPA aerosol inhalation and 4.2 mCi Technetium-58m MAA IV COMPARISON:  Chest x-ray 06/23/2016 known FINDINGS: Ventilation: No focal ventilation defect. Perfusion: No wedge shaped peripheral perfusion defects to suggest acute pulmonary embolism. Note: Lateral images were not obtained secondary to patient inability to elevate arms. IMPRESSION: Normal bilateral lung perfusion. Electronically Signed   By: Misty Stanley M.D.   On: 06/26/2016 12:37        Scheduled Meds: . sodium chloride   Intravenous Once  . allopurinol  100 mg Oral Daily  . amLODipine  10 mg Oral QHS  . atorvastatin  20 mg Oral QHS  . aztreonam  500 mg Intravenous Q8H  . divalproex  500 mg Oral Q8H  . famotidine  20 mg Oral  Daily  . hydrALAZINE  100 mg Oral Q8H  . isosorbide dinitrate  30 mg Oral BID  . lacosamide  100 mg Oral BID  . levETIRAcetam  500 mg Oral BID  . metoCLOPramide (REGLAN) injection  5 mg Intravenous Q8H  . metoprolol tartrate  25 mg Oral BID  . polyethylene glycol  17 g Oral BID  . sodium chloride flush  3 mL Intravenous Q12H  . traZODone  50 mg Oral QHS  . [START ON 06/28/2016] vancomycin  750 mg Intravenous Q24H   Continuous Infusions: . dextrose 75 mL/hr at 06/27/16 0609     LOS: 4 days    Time spent: [place], Cassie Freer, MD [facility] Pager [phone]  If 7PM-7AM, please contact night-coverage [email or web address] Password [facility] 06/27/2016, 8:49 AM

## 2016-06-27 NOTE — Progress Notes (Signed)
Pharmacy Antibiotic Note  [name] is a 65 y.o. female admitted on 06/22/2016 with seizures, now w/ MRSA blood cx per BCID report below.  Pharmacy has been consulted for vancomycin dosing.  Plan: Vancomycin 1000mg  x1 then 750mg  IV every 24 hours.  Goal trough 15-20 mcg/mL.  Height: 5\' 3"  (160 cm) Weight: 163 lb 12.8 oz (74.3 kg) IBW/kg (Calculated) : 52.4  Temp (24hrs), Avg:98.6 F (37 C), Min:98.3 F (36.8 C), Max:98.7 F (37.1 C)   Recent Labs Lab 06/22/16 1255  06/24/16 0509 06/25/16 0442 06/25/16 0737 06/26/16 0512 06/27/16 0426  WBC  --   < > 10.4 9.2 8.5 11.1* 8.8  CREATININE 1.70*  < > 1.93* 2.81* 2.85* 2.69* 2.62*  LATICACIDVEN 1.44  --   --   --   --   --   --   < > = values in this interval not displayed.  Estimated Creatinine Clearance: 20.4 mL/min (by C-G formula based on Cr of 2.62).    Allergies  Allergen Reactions  . Daptomycin Other (See Comments)    Elevated CPK  . Morphine And Related Nausea And Vomiting  . Cephalexin Rash  . Tramadol Nausea And Vomiting  . Atenolol     unknown  . Cephalosporins Other (See Comments)    Unknown  . Dapsone Other (See Comments)    unknown  . Erythromycin     unknown  . Erythromycin Base Other (See Comments)    Unknown  . Celecoxib Other (See Comments)    Unknown, can use furosemide without issue  . Codeine Other (See Comments)    unknown  . Fluoxetine Hcl Other (See Comments)    unknown  . Latex Other (See Comments)  . Ofloxacin Other (See Comments)    unknown  . Penicillins Other (See Comments)    Unknown  Has patient had a PCN reaction causing immediate rash, facial/tongue/throat swelling, SOB or lightheadedness with hypotension: unknown Has patient had a PCN reaction causing severe rash involving mucus membranes or skin necrosis: unknown Has patient had a PCN reaction that required hospitalization unknown Has patient had a PCN reaction occurring within the last 10 years: unknown If all of the  above answers are "NO", then may proceed with Cephalosporin use.  [name] Rofecoxib Other (See Comments)    unknown  . Sulfonamide Derivatives Other (See Comments)    unknown     Results for orders placed or performed during the hospital encounter of 06/22/16  Blood Culture ID Panel (Reflexed) (Collected: 06/26/2016 10:00 AM)  Result Value Ref Range   Enterococcus species NOT DETECTED NOT DETECTED   Vancomycin resistance NOT DETECTED NOT DETECTED   Listeria monocytogenes NOT DETECTED NOT DETECTED   Staphylococcus species DETECTED (A) NOT DETECTED   Staphylococcus aureus NOT DETECTED NOT DETECTED   Methicillin resistance DETECTED (A) NOT DETECTED   Streptococcus species NOT DETECTED NOT DETECTED   Streptococcus agalactiae NOT DETECTED NOT DETECTED   Streptococcus pneumoniae NOT DETECTED NOT DETECTED   Streptococcus pyogenes NOT DETECTED NOT DETECTED   Acinetobacter baumannii NOT DETECTED NOT DETECTED   Enterobacteriaceae species NOT DETECTED NOT DETECTED   Enterobacter cloacae complex NOT DETECTED NOT DETECTED   Escherichia coli NOT DETECTED NOT DETECTED   Klebsiella oxytoca NOT DETECTED NOT DETECTED   Klebsiella pneumoniae NOT DETECTED NOT DETECTED   Proteus species NOT DETECTED NOT DETECTED   Serratia marcescens NOT DETECTED NOT DETECTED   Carbapenem resistance NOT DETECTED NOT DETECTED   Haemophilus influenzae NOT DETECTED NOT DETECTED  Neisseria meningitidis NOT DETECTED NOT DETECTED   Pseudomonas aeruginosa NOT DETECTED NOT DETECTED   Candida albicans NOT DETECTED NOT DETECTED   Candida glabrata NOT DETECTED NOT DETECTED   Candida krusei NOT DETECTED NOT DETECTED   Candida parapsilosis NOT DETECTED NOT DETECTED   Candida tropicalis NOT DETECTED NOT DETECTED    Name of physician (or Provider) Contacted: Chaney Malling, NP  Changes to prescribed antibiotics required: [place], PharmD, BCPS 06/27/2016 6:48 AM

## 2016-06-28 ENCOUNTER — Inpatient Hospital Stay (HOSPITAL_COMMUNITY)

## 2016-06-28 LAB — CBC
HEMATOCRIT: 27.1 % — AB (ref 36.0–46.0)
HEMOGLOBIN: 8.5 g/dL — AB (ref 12.0–15.0)
MCH: 28.1 pg (ref 26.0–34.0)
MCHC: 31.4 g/dL (ref 30.0–36.0)
MCV: 89.7 fL (ref 78.0–100.0)
Platelets: 168 10*3/uL (ref 150–400)
RBC: 3.02 MIL/uL — AB (ref 3.87–5.11)
RDW: 15.4 % (ref 11.5–15.5)
WBC: 7.2 10*3/uL (ref 4.0–10.5)

## 2016-06-28 LAB — BASIC METABOLIC PANEL
Anion gap: 8 (ref 5–15)
BUN: 35 mg/dL — ABNORMAL HIGH (ref 6–20)
CALCIUM: 7.7 mg/dL — AB (ref 8.9–10.3)
CO2: 20 mmol/L — AB (ref 22–32)
CREATININE: 2.46 mg/dL — AB (ref 0.44–1.00)
Chloride: 111 mmol/L (ref 101–111)
GFR calc non Af Amer: 19 mL/min — ABNORMAL LOW (ref >60)
GFR, EST AFRICAN AMERICAN: 22 mL/min — AB (ref >60)
Glucose, Bld: 102 mg/dL — ABNORMAL HIGH (ref 65–99)
Potassium: 4.2 mmol/L (ref 3.5–5.1)
SODIUM: 139 mmol/L (ref 135–145)

## 2016-06-28 LAB — URINE CULTURE: Culture: >=100000 — AB

## 2016-06-28 MED ORDER — RESOURCE THICKENUP CLEAR PO POWD
ORAL | Status: DC | PRN
  Filled 2016-06-28: qty 125

## 2016-06-28 MED ORDER — ACETAMINOPHEN 325 MG PO TABS: 650.0000 mg | ORAL_TABLET | Freq: Four times a day (QID) | ORAL | Status: DC | PRN

## 2016-06-28 MED ORDER — HYDRALAZINE HCL 50 MG PO TABS
100.0000 mg | ORAL_TABLET | Freq: Three times a day (TID) | ORAL | Status: DC
  Administered 2016-06-28 – 2016-06-29 (×3): 100 mg via ORAL
  Filled 2016-06-28 (×3): qty 2

## 2016-06-28 MED ORDER — METOCLOPRAMIDE HCL 5 MG PO TABS
5.0000 mg | ORAL_TABLET | Freq: Three times a day (TID) | ORAL | Status: DC
  Administered 2016-06-28 – 2016-06-29 (×3): 5 mg via ORAL
  Filled 2016-06-28 (×3): qty 1

## 2016-06-28 NOTE — Progress Notes (Deleted)
Orders received for pt discharge.  Discharge summary printed and reviewed with pt.  Explained medication regimen, and pt had no further questions at this time.  IV removed and site remains clean, dry, intact.  Telemetry removed.  Pt in stable condition and awaiting transport. 

## 2016-06-28 NOTE — Progress Notes (Signed)
MBSS complete. Full report located under chart review in imaging section.  Recommend: Dysphagia 1, nectar-thick liquids, meds crushed in puree, full supervision: cue small bites/ sips, assist with feeding, cue pt to swallow 2 times, check intermittently for oral residuals.   Blair Heys, [place], [place]

## 2016-06-28 NOTE — Progress Notes (Signed)
Speech Language Pathology Treatment: Dysphagia  Patient Details Name: [name] MRN: [id number] DOB: January 31, 1951 Today's Date: 06/28/2016 Time: [id number] SLP Time Calculation (min) (ACUTE ONLY): 15 min  Assessment / Plan / Recommendation Clinical Impression  Dysphagia treatment provided for diet tolerance check. RN reported that pt with congested cough during meals today. Pt was alert with good sustained attention to PO trials. Pt with an immediate cough following trials of nectar-thick liquids, delayed cough following trial of puree. Pt also with multiple swallows across consistencies, concerning for pharyngeal residuals post-swallow. Recommend that pt be NPO at this time (meds crushed in puree okay) with further recommendations pending MBS. Plan for MBS later this afternoon.    HPI HPI: [name] is a 65 y.o. 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 She's been living in the nursing home for 3 years now and is full assist for all ADLs, w/ recent dx in 1/17 of seizure disorder, with PMHx of atrial fibrillation, history of hypertension, chronic 2 L n/c at NH, and chronic kidney disease, presents to our hospital for seizure. Chest x ray reveals Mild asymmetric increased opacification within the right upper lobe and right midlung which may reflect underlying aspiration,      SLP Plan  New goals to be determined pending instrumental study     Recommendations  Diet recommendations: NPO Medication Administration: Crushed with puree             Oral Care Recommendations: Oral care QID Follow up Recommendations: 24 hour supervision/assistance Plan: New goals to be determined pending instrumental study     Kiana, [place], [place], CCC-SLP 06/28/2016, 3:19 PM [phone]

## 2016-06-28 NOTE — Progress Notes (Signed)
PROGRESS NOTE    [name]  [id number] DOB: 01-Nov-1951 DOA: 06/22/2016 PCP: Kandice Hams, MD    Brief Narrative:  Patient is a 65 year old female bedbound for the past 3 years nursing home resident with history of severe debility, atrial fibrillation, hypertension, chronic kidney disease stage III, obstructive sleep apnea, CHF, morbid obesity, history of seizures presented to the ED with status epilepticus. Patient initially loaded with Keppra and placed on IV Keppra twice daily. Patient had also presented with shortness of breath and tachypnea. Due to patient's mobility status, presentation was shortness of breath and tachypnea concern was for PE and a such VQ scan was ordered. On route to VQ scan patient noted to have another episode of seizures and had to be loaded with IV Depakote and IV Keppra and doses of Keppra increased and patient placed on Depakote as well as Vimpat. Patient also on heparin drip. Patient opens eyes to verbal stimuli however not following commands and nonresponsive which her husband is not her baseline. Patient also noted on the night of 06/22/2016 went into SVT requiring adenosine and now on scheduled IV Lopressor. Neurology and cardiology have been consulted. Patient underwent EEG which was consistent with status epilepticus which improved daily. Patient subsequently underwent MRI of the head with findings consistent with status epilepticus versus PRES. goal blood pressure systolic less than [id number]. 2-D echo done did show a mild to moderate pericardial effusion per cardiology.   Assessment & Plan:   Principal Problem:   Status epilepticus ([place]) Active Problems:   PAF (paroxysmal atrial fibrillation) (HCC)   Chronic combined systolic and diastolic CHF (congestive heart failure) (HCC)   SOB (shortness of breath)   Total self-care deficit   AKI (acute kidney injury) ([place])   CKD (chronic kidney disease) stage 3, GFR 30-59 ml/min   Malignant HTN with heart  disease, w/o CHF, with chronic kidney disease   SVT (supraventricular tachycardia) (HCC)   Hematoma of left lower extremity   Pericardial effusion   Acute kidney injury superimposed on CKD ([place])   PRES (posterior reversible encephalopathy syndrome): Possible   Malnutrition of moderate degree  1- status epilepticus VS PRES Patient presented with generalized tonic-clonic seizures after episode of confusion and disorientation. Patient was given a loading dose of Keppra and placed on Keppra 750 mg twice daily. CT head which was done showed prior bilateral occipital ischemic changes. There was also concern that patient may have had arrhythmias which may have led to decreased cerebral perfusion provoking seizures secondary to bilateral occipital ischemic changes noted. The night of 06/22/2016, patient was noted to have episode of seizures on route to VQ scan and received Ativan at that time. Neurology was called and patient was loaded with 1 g of IV Depakote followed by 500 mg of IV Depakote twice daily as well as IV Keppra and Keppra dose increased to 1 g IV twice daily. Patient was also given IV Vimpat and placed on 100 mg of Vimpat twice daily. Patient with no signs of infectious etiology. Patient with low-grade fever this morning and also leukocytosis however these may have likely been secondary to seizure episodes. Leukocytosis trending down. EEG consistent with status epilepticus. Concern for possible acute ischemic CVA and as such MRI of the brain was done 06/24/2016 that showed extensive multifocal T2/FLAIR signal abnormality involving bilateral cerebral hemispheres as well as cerebellum with associated patchy diffusion abnormality likely related to status epilepticus and/or. PRES. No acute vascular infarct.remote bilateral PCA territory infarcts with remote left pontine  lacunar infarct. Underlying mild chronic small vessel ischemic disease.  -Valproic acid level decreased at 29. -Continue Norvasc and  hydralazine and Isordil . -Goal blood pressure systolic less than [id number]. -Continue Vimpat, Keppra,  Depakote. Per neurology. -slowly improving.   2 SVT Patient noted to go into SVT the night of 06/22/2016, and had to be given adenosine 6 mg IV 1 with improvement and subsequently placed on scheduled Lopressor.TSH within normal limits at 1.301. 2-D echo with EF of 50-55% with grade 1 diastolic dysfunction and mild to moderate pericardial effusion with no signs of tamponade..Cardiology sign off.  Continue with  metoprolol. Continue with PRN.   3 mild to moderate pericardial effusion Per 2-D echo. No signs of tamponade. Monitor. Per cardiology.  4 Paroxysmal atrial fibrillation Currently rate controlled on scheduled IV Lopressor. Patient NOT on anticoagulation in the past secondary to history of GI bleed and hematoma. Discontinued IV heparin due to bleeding hematoma noted this morning.   5 Shortness of breath/tachypnea Patient presented with shortness of breath and tachypnea with a history of being bedbound for approximately 3 years. Concern for pulmonary emboli. VQ scan was ordered however on route to VQ scan patient had seizures and a such VQ scan is still pending. D/C IV heparin drip. Repeat chest x-ray 06/23/2016 with no significant change. Follow. 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 Continue scheduled IV Lopressor. Hydralazine as needed.  -Continue with of Norvasc, hydralazine, Isordil .  - Goal systolic blood pressure less than 140. Hydralazine when necessary.  7 Acute on chronic kidney disease stage III Likely secondary to prerenal azotemia. Patient noted to have acute blood loss secondary to hematoma with hemoglobin at 6.5. -renal US negative for hydronephrosis.  -IV fluids. Follow trend.  -Hypernatremia; Continue with  IV fluids to D 5. Improved.   8 Leukocytosis, Bacteremia. Staph coagulase  negative 2/2  UA with too numerous to count WBC. Will follow urine culture.  Blood culture 7-15 aerobic growing gram positive rods. Considering contaminant.  Blood culture 7-19 growing 2 out of 2 staph coagulase. Started vancomycin 7-19 ID consulted. Appreciate recommendation.  Plan to continue with vancomycin for 2 more days.  Urine culture growing klebsiella, follow sensitivity to give one more day of antibiotics.   9 combined chronic systolic and diastolic heart failure Currently stable.  10 left lower extremity bleeding hematoma Patient noted to have a bleeding/ruptured hematoma the morning of 06/24/2016 and pressure dressing in place. No further bleeding per nursing. Discontinued IV heparin. Patient with ruptured/bleeding hematoma the morning of 06/24/2016 and as such anticoagulation was discontinued.  Patient with prior history of GI bleed and hematoma and a such on NO anticoagulation for A. fib. Heparin has been discontinued.   11 ABLA Likely secondary to ruptured LLE hematoma. HGB 6.5 from 9.7. S/P  transfuse 2 units PRBCs 7-18. Repeat hb tonight   12 prognosis Patient has been bedridden for the past 3 years with comorbidities admitted with status epilepticus. Patient also noted to go into SVT during the hospitalization. Patient had MRI done with findings concerning for status epilepticus versus PRES. patient indeed has PRES will need tighter blood pressure control with goal systolic blood pressure less than 140. Patient was on hospice prior to admission however was a full code. Discussed with patient's husband in terms of prognosis and depending on neurology's evaluation will see how patient does over the next few days and if no significant improvement or  deterioration may consider palliative care consult for goals of care. If patient indeed has PRES will likely take several weeks to see full recovery and in that case could have palliative care goals of care done in the outpatient  setting. -husband would like to continue with current treatments.   DVT prophylaxis: SCD Code Status: Partial code, discussed with husband. He does not wants patient to be intubated.  Family Communication: Updated husband at bedside.  Disposition Plan: transfer to telemetry discharge 7-22   Consultants:   Neurology: Dr. Tasia Catchings 06/22/2016  Cardiology: Dr. Meda Coffee 06/23/2016  Procedures:  EEG 06/23/2016  CT head 06/22/2016  Chest x-ray 06/22/2016, 06/23/2016  MRI 06/24/2016  2 d echo 06/24/2016  2 units PRBC   Antimicrobials:   None   Subjective: Alert, slowly answering questions.  Denies pain.   Objective: Filed Vitals:   06/27/16 2240 06/27/16 2246 06/28/16 0333 06/28/16 0725  BP: 150/59 150/59 148/56 147/52  Pulse: 90 81 73 73  Temp: 98.7 F (37.1 C)  97.9 F (36.6 C) 97.7 F (36.5 C)  TempSrc: Oral  Axillary Axillary  Resp: 20  25 21   Height:      Weight:      SpO2: 93%  95% 100%    Intake/Output Summary (Last 24 hours) at 06/28/16 0810 Last data filed at 06/27/16 1900  Gross per 24 hour  Intake 1776.25 ml  Output      0 ml  Net 1776.25 ml   Filed Weights   06/22/16 1331 06/22/16 1931  Weight: 101.606 kg (224 lb) 74.3 kg (163 lb 12.8 oz)    Examination:  General exam; alert . Following some commands.  Respiratory system: Clear to auscultation anterior lung fields. Respiratory effort normal. Cardiovascular system: Irregularly irregular. No JVD, murmurs, rubs, gallops or clicks. No pedal edema. Gastrointestinal system: Abdomen is nondistended, soft and nontender. No organomegaly or masses felt. Normal bowel sounds heard. Central nervous system: Alert and oriented to self only. Extremities: 3-4/5 RUE strength, 2/5 LUE strength Skin: No rashes, lesions or ulcers. Left lower extremity with pressure dressing status post ruptured hematoma. Psychiatry: . Mood & affect flat.     Data Reviewed: I have personally reviewed following labs and  imaging studies  CBC:  Recent Labs Lab 06/22/16 1240  06/25/16 0442 06/25/16 0737 06/25/16 0828 06/26/16 0512 06/26/16 1430 06/27/16 0426 06/28/16 0345  WBC 12.1*  < > 9.2 8.5  --  11.1*  --  8.8 7.2  NEUTROABS 9.6*  --   --   --   --   --   --   --   --   HGB 10.2*  < > 6.6* 6.5* 6.5* 9.4* 9.4* 8.4* 8.5*  HCT 32.9*  < > 21.4* 21.4* 21.4* 28.8* 29.8* 26.5* 27.1*  MCV 92.9  < > 92.6 93.4  --  89.4  --  90.1 89.7  PLT 231  < > 159 166  --  161  --  148* 168  < > = values in this interval not displayed. Basic Metabolic Panel:  Recent Labs Lab 06/23/16 0300 06/24/16 0509 06/25/16 0442 06/25/16 0737 06/25/16 0828 06/26/16 0512 06/27/16 0426 06/28/16 0345  NA 143 144 142 144  --  146* 142 139  K 4.2 3.5 5.2* 4.8 4.7 4.5 4.3 4.2  CL 114* 115* 115* 116*  --  116* 118* 111  CO2 18* 22 17* 20*  --  19* 18* 20*  GLUCOSE 88 94 62* 69  --  81 99 102*  BUN 36* 31* 39* 38*  --  36* 37* 35*  CREATININE 1.72* 1.93* 2.81* 2.85*  --  2.69* 2.62* 2.46*  CALCIUM 8.3* 8.3* 8.1* 8.0*  --  8.1* 7.8* 7.7*  MG 1.9 1.9  --   --   --  1.9  --   --   PHOS 3.6  --   --   --   --  5.2*  --   --    GFR: Estimated Creatinine Clearance: 21.7 mL/min (by C-G formula based on Cr of 2.46). Liver Function Tests:  Recent Labs Lab 06/22/16 1240 06/23/16 0300 06/24/16 0509 06/26/16 0512  AST 23 20 15   --   ALT 9* 9* 7*  --   ALKPHOS 106 94 86  --   BILITOT 0.7 0.6 0.7  --   PROT 6.3* 5.7* 5.5*  --   ALBUMIN 2.8* 2.3* 2.1* 2.0*   No results for input(s): LIPASE, AMYLASE in the last 168 hours. No results for input(s): AMMONIA in the last 168 hours. Coagulation Profile:  Recent Labs Lab 06/22/16 1240  INR 0.94   Cardiac Enzymes: No results for input(s): CKTOTAL, CKMB, CKMBINDEX, TROPONINI in the last 168 hours. BNP (last 3 results) No results for input(s): PROBNP in the last 8760 hours. HbA1C: No results for input(s): HGBA1C in the last 72 hours. CBG: No results for input(s): GLUCAP  in the last 168 hours. Lipid Profile: No results for input(s): CHOL, HDL, LDLCALC, TRIG, CHOLHDL, LDLDIRECT in the last 72 hours. Thyroid Function Tests: No results for input(s): TSH, T4TOTAL, FREET4, T3FREE, THYROIDAB in the last 72 hours. Anemia Panel: No results for input(s): VITAMINB12, FOLATE, FERRITIN, TIBC, IRON, RETICCTPCT in the last 72 hours. Sepsis Labs:  Recent Labs Lab 06/22/16 1255  LATICACIDVEN 1.44    Recent Results (from the past 240 hour(s))  Blood Culture (routine x 2)     Status: Abnormal   Collection Time: 06/22/16  2:01 PM  Result Value Ref Range Status   Specimen Description BLOOD LEFT WRIST  Final   Special Requests BOTTLES DRAWN AEROBIC ONLY 5CC  Final   Culture  Setup Time   Final    GRAM POSITIVE RODS AEROBIC BOTTLE ONLY CRITICAL VALUE NOTED.  VALUE IS CONSISTENT WITH PREVIOUSLY REPORTED AND CALLED VALUE.    Culture (A)  Final    DIPHTHEROIDS(CORYNEBACTERIUM SPECIES) Standardized susceptibility testing for this organism is not available.    Report Status 06/27/2016 FINAL  Final  Blood Culture (routine x 2)     Status: Abnormal   Collection Time: 06/22/16  2:05 PM  Result Value Ref Range Status   Specimen Description BLOOD LEFT HAND  Final   Special Requests BOTTLES DRAWN AEROBIC ONLY 5CC  Final   Culture  Setup Time   Final    GRAM POSITIVE RODS AEROBIC BOTTLE ONLY CRITICAL RESULT CALLED TO, READ BACK BY AND VERIFIED WITH: M TURNER,PHARMD AT 1522 06/24/16 BY L BENFIELD    Culture (A)  Final    DIPHTHEROIDS(CORYNEBACTERIUM SPECIES) Standardized susceptibility testing for this organism is not available.    Report Status 06/26/2016 FINAL  Final  Blood Culture ID Panel (Reflexed)     Status: None   Collection Time: 06/22/16  2:05 PM  Result Value Ref Range Status   Enterococcus species NOT DETECTED NOT DETECTED Final   Vancomycin resistance NOT DETECTED NOT DETECTED Final   Listeria monocytogenes NOT DETECTED NOT DETECTED Final   Staphylococcus  species NOT DETECTED NOT DETECTED Final   Staphylococcus aureus  NOT DETECTED NOT DETECTED Final   Methicillin resistance NOT DETECTED NOT DETECTED Final   Streptococcus species NOT DETECTED NOT DETECTED Final   Streptococcus agalactiae NOT DETECTED NOT DETECTED Final   Streptococcus pneumoniae NOT DETECTED NOT DETECTED Final   Streptococcus pyogenes NOT DETECTED NOT DETECTED Final   Acinetobacter baumannii NOT DETECTED NOT DETECTED Final   Enterobacteriaceae species NOT DETECTED NOT DETECTED Final   Enterobacter cloacae complex NOT DETECTED NOT DETECTED Final   Escherichia coli NOT DETECTED NOT DETECTED Final   Klebsiella oxytoca NOT DETECTED NOT DETECTED Final   Klebsiella pneumoniae NOT DETECTED NOT DETECTED Final   Proteus species NOT DETECTED NOT DETECTED Final   Serratia marcescens NOT DETECTED NOT DETECTED Final   Carbapenem resistance NOT DETECTED NOT DETECTED Final   Haemophilus influenzae NOT DETECTED NOT DETECTED Final   Neisseria meningitidis NOT DETECTED NOT DETECTED Final   Pseudomonas aeruginosa NOT DETECTED NOT DETECTED Final   Candida albicans NOT DETECTED NOT DETECTED Final   Candida glabrata NOT DETECTED NOT DETECTED Final   Candida krusei NOT DETECTED NOT DETECTED Final   Candida parapsilosis NOT DETECTED NOT DETECTED Final   Candida tropicalis NOT DETECTED NOT DETECTED Final  Urine culture     Status: Abnormal   Collection Time: 06/22/16  3:52 PM  Result Value Ref Range Status   Specimen Description URINE, CATHETERIZED  Final   Special Requests NONE  Final   Culture MULTIPLE SPECIES PRESENT, SUGGEST RECOLLECTION (A)  Final   Report Status 06/23/2016 FINAL  Final  MRSA PCR Screening     Status: None   Collection Time: 06/23/16  1:03 AM  Result Value Ref Range Status   MRSA by PCR NEGATIVE NEGATIVE Final    Comment:        The GeneXpert MRSA Assay (FDA approved for NASAL specimens only), is one component of a comprehensive MRSA colonization surveillance  program. It is not intended to diagnose MRSA infection nor to guide or monitor treatment for MRSA infections.   Urine culture     Status: Abnormal (Preliminary result)   Collection Time: 06/25/16  6:11 PM  Result Value Ref Range Status   Specimen Description URINE, CATHETERIZED  Final   Special Requests NONE  Final   Culture (A)  Final    >=100,000 COLONIES/mL KLEBSIELLA PNEUMONIAE SUSCEPTIBILITIES TO FOLLOW    Report Status PENDING  Incomplete  Culture, blood (routine x 2)     Status: None (Preliminary result)   Collection Time: 06/26/16 10:00 AM  Result Value Ref Range Status   Specimen Description BLOOD LEFT HAND  Final   Special Requests BOTTLES DRAWN AEROBIC ONLY 5CC  Final   Culture  Setup Time   Final    GRAM POSITIVE COCCI IN CLUSTERS AEROBIC BOTTLE ONLY Organism ID to follow CRITICAL RESULT CALLED TO, READ BACK BY AND VERIFIED WITH: Wynona Neat, PHARM AT [id number] ON [id number] BY M. KELLY    Culture NO GROWTH 1 DAY  Final   Report Status PENDING  Incomplete  Blood Culture ID Panel (Reflexed)     Status: Abnormal   Collection Time: 06/26/16 10:00 AM  Result Value Ref Range Status   Enterococcus species NOT DETECTED NOT DETECTED Final   Vancomycin resistance NOT DETECTED NOT DETECTED Final   Listeria monocytogenes NOT DETECTED NOT DETECTED Final   Staphylococcus species DETECTED (A) NOT DETECTED Final    Comment: CRITICAL RESULT CALLED TO, READ BACK BY AND VERIFIED WITH: VERONDA BRYK,PHARMD @0635  06/27/16 MKELLY    Staphylococcus aureus  NOT DETECTED NOT DETECTED Final   Methicillin resistance DETECTED (A) NOT DETECTED Final    Comment: CRITICAL RESULT CALLED TO, READ BACK BY AND VERIFIED WITH: VERONDA BRYK, PHARMD @0635  06/27/16 MKELLY    Streptococcus species NOT DETECTED NOT DETECTED Final   Streptococcus agalactiae NOT DETECTED NOT DETECTED Final   Streptococcus pneumoniae NOT DETECTED NOT DETECTED Final   Streptococcus pyogenes NOT DETECTED NOT DETECTED Final    Acinetobacter baumannii NOT DETECTED NOT DETECTED Final   Enterobacteriaceae species NOT DETECTED NOT DETECTED Final   Enterobacter cloacae complex NOT DETECTED NOT DETECTED Final   Escherichia coli NOT DETECTED NOT DETECTED Final   Klebsiella oxytoca NOT DETECTED NOT DETECTED Final   Klebsiella pneumoniae NOT DETECTED NOT DETECTED Final   Proteus species NOT DETECTED NOT DETECTED Final   Serratia marcescens NOT DETECTED NOT DETECTED Final   Carbapenem resistance NOT DETECTED NOT DETECTED Final   Haemophilus influenzae NOT DETECTED NOT DETECTED Final   Neisseria meningitidis NOT DETECTED NOT DETECTED Final   Pseudomonas aeruginosa NOT DETECTED NOT DETECTED Final   Candida albicans NOT DETECTED NOT DETECTED Final   Candida glabrata NOT DETECTED NOT DETECTED Final   Candida krusei NOT DETECTED NOT DETECTED Final   Candida parapsilosis NOT DETECTED NOT DETECTED Final   Candida tropicalis NOT DETECTED NOT DETECTED Final  Culture, blood (routine x 2)     Status: None (Preliminary result)   Collection Time: 06/26/16 10:10 AM  Result Value Ref Range Status   Specimen Description BLOOD RIGHT HAND  Final   Special Requests BOTTLES DRAWN AEROBIC ONLY 5CC  Final   Culture  Setup Time   Final    GRAM POSITIVE COCCI IN CLUSTERS AEROBIC BOTTLE ONLY CRITICAL RESULT CALLED TO, READ BACK BY AND VERIFIED WITH: Karlene Einstein, PHARM AT [place] [id number] BY S. YARBROUGH    Culture NO GROWTH 1 DAY  Final   Report Status PENDING  Incomplete         Radiology Studies: Nm Pulmonary Perf And Vent  06/26/2016  CLINICAL DATA:  Shortness of breath. EXAM: NUCLEAR MEDICINE VENTILATION - PERFUSION LUNG SCAN TECHNIQUE: Ventilation images were obtained in multiple projections using inhaled aerosol Tc-62m DTPA. Perfusion images were obtained in multiple projections after intravenous injection of Tc-94m MAA. RADIOPHARMACEUTICALS:  Thirty-three MCi Technetium-74m DTPA aerosol inhalation and 4.2 mCi Technetium-50m MAA  IV COMPARISON:  Chest x-ray 06/23/2016 known FINDINGS: Ventilation: No focal ventilation defect. Perfusion: No wedge shaped peripheral perfusion defects to suggest acute pulmonary embolism. Note: Lateral images were not obtained secondary to patient inability to elevate arms. IMPRESSION: Normal bilateral lung perfusion. Electronically Signed   By: Misty Stanley M.D.   On: 06/26/2016 12:37        Scheduled Meds: . sodium chloride   Intravenous Once  . allopurinol  100 mg Oral Daily  . amLODipine  10 mg Oral QHS  . atorvastatin  20 mg Oral QHS  . divalproex  500 mg Oral Q8H  . famotidine  20 mg Oral Daily  . hydrALAZINE  100 mg Oral Q8H  . isosorbide dinitrate  30 mg Oral BID  . lacosamide  100 mg Oral BID  . levETIRAcetam  500 mg Oral BID  . metoCLOPramide (REGLAN) injection  5 mg Intravenous Q8H  . metoprolol tartrate  25 mg Oral BID  . polyethylene glycol  17 g Oral BID  . sodium bicarbonate  650 mg Oral BID  . sodium chloride flush  3 mL Intravenous Q12H  . traZODone  50  mg Oral QHS  . vancomycin  750 mg Intravenous Q24H   Continuous Infusions: . dextrose 75 mL/hr at 06/27/16 0609     LOS: 5 days    Time spent: [place], Cassie Freer, MD [facility] Pager [phone]  If 7PM-7AM, please contact night-coverage [email or web address] Password [email or web address] 06/28/2016, 8:10 AM

## 2016-06-28 NOTE — Progress Notes (Signed)
[id number]-GIP RN Liaison Note   This is a GIP, related, covered hospital admission of 06/22/16 per Dr. Shelbie Proctor, with a [facility] Dx of cerebrovascular disease. Patient is now a PARTIAL CODE.  Patient seen in room, laying in bed, with husband at bedside. Patient sleeping soundly, however arouses easily to voice. Patient appears comfortable.She is now on RA. She continues to receive IVF via a PIV. She is on Vancomycin IV Q day for CoNS positive blood cultures. Her AEDs have all been converted to PO as patient is now tolerating food. She ate approximately 75% of her breakfast tray.  She has not required any PRN pain, blood pressure or seizure medication in the past 24 hours, per chart review.  Spoke to husband about wishes at discharge.  He stated he feels that if she continues to improve he would like her to return to [facility] with the support of hospice.  He reported this could be in the next couple days as she finishes her course of antibiotics.  He also stated he understands she has had more episodes of decline in recent months and still wishes for her comfort ultimately.  He stated if she declines again he would consider [facility] for EOL care if she was medically appropriate.  He reported he feels she is getting closer to EOL and again wants her to be comfortable.  Offered emotional support and left contact information with husband if any needs arise.  [facility] will continue to follow patient until discharge. Please call with any hospice-related questions or concerns.  Freddi Starr, RN, [facility] Liaison  [phone]

## 2016-06-28 NOTE — Progress Notes (Signed)
As per order, pt's midline back is to be cleansed BID with Easy Cleanse skin cleanser, pat dry, and apply Critic Aid (purple Top) moisture barrier ointment.  Requested that Risk analyst order these items.  Called the nurse on [facility] who was unaware of this order, and could not verify these items. Secretary called pharmacy and they were unaware of what Easy Cleanse is.  Secretary then called [employer].  It was confirmed that the Easy Cleanse is the "No-Rinse Cleanser" that is stocked on our unit.  The purple top moisture barrier ointment is the "Critic-Aid Clear" that we stock on our unit.   I have reported off to PM nurse of this matter.

## 2016-06-28 NOTE — Clinical Social Work Note (Signed)
CSW continues to follow for discharge needs. Plan is for SNF tomorrow. Facility aware.  Dayton Scrape, [place]

## 2016-06-28 NOTE — Care Management Note (Signed)
Case Management Note  Patient Details  Name: [name] MRN: [id number] Date of Birth: Jun 14, 1951  Subjective/Objective:   Patient is from [place] with [place]. Presents with seizure, CSW aware and following, patient will return to snf at dc.                 Action/Plan:   Expected Discharge Date:                  Expected Discharge Plan:  Skilled Nursing Facility  In-House Referral:  Clinical Social Work  Discharge planning Services  CM Consult  Post Acute Care Choice:    Choice offered to:     DME Arranged:    DME Agency:     HH Arranged:    [place] Agency:     Status of Service:  Completed, signed off  If discussed at [employer], dates discussed:    Additional Comments:  [name], RN 06/28/2016, 12:35 PM

## 2016-06-28 NOTE — Progress Notes (Signed)
Subjective: No change from yesterday.  Nurse reports she was more alert this morning than she has been.  Exam: Filed Vitals:   06/28/16 0919 06/28/16 1141  BP: 137/41 132/51  Pulse: 74 60  Temp:  97.8 F (36.6 C)  Resp:  20    HEENT-  Normocephalic, no lesions, without obvious abnormality.  Normal external eye and conjunctiva.  Cardiovascular- S1, S2 normal, pulses palpable throughout   Lungs- no tachypnea, retractions or cyanosis, weak, wet cough Abdomen- normal findings: bowel sounds normal Extremities- less then 2 second capillary refill, generalized edema Lymph-no adenopathy palpable Musculoskeletal-no joint tenderness, deformity or swelling Skin-dry    Gen: In bed, NAD MS: sleepy, arousable with loud voice, follows commands sluggishly, answers questions CN: She is able to cross midline bilaterally, Rt hemianopia, face symmetric Motor: Moving both sides equally, grossly weak Sensory: States she feels light touch symmetrically   Pertinent Labs/Diagnostics: None  April Pugh, MSN, RN Neurology [phone]  Impression: 65 year old female Admitted with status epilepticus who subsequently had left-sided weakness and right-sided PLEDs following prolonged seizure. PLEDs improved and then resolved while on EEG monitoring. MRI showed multiple cortical T2 hyperintensities. MRI could be indicative of prolonged status, but also possible and I think more likley is PRES for which she is cerrtainly at risk given fluctuating BP(systolics in the [id number]), renal failure. PRES would have a better prognosis.    Recommendations: 1) Continue Vimpat 100 twice a day 2) Continue depakote 500mg  po tid 30 Continue Keppra 500mg  po bid(renally dosed)   Roland Rack, MD [facility] [phone]  If 7pm- 7am, please page neurology on call as listed in [facility].  06/28/2016, 1:43 PM

## 2016-06-28 NOTE — Progress Notes (Signed)
Pt on Dysphagia 1 diet, with Nectar Thick for liquids.  Swallow eval nursing instructions placed at head of bed.  Suction is set up at bedside for seizure precautions or choking.

## 2016-06-28 NOTE — Evaluation (Signed)
Occupational Therapy Evaluation Patient Details Name: [name] MRN: [id number] DOB: [birth year] Today's Date: 06/28/2016    History of Present Illness Pt admitted after seizure at her SNF, [facility]. Pt with longstanding dependence in all mobility, essentially bedridden, and self care with the exception of being able to self feed. Pt receives hospice services. PMH: afib, cerebrovascular disease, HTN, CKD, seizures   Clinical Impression   Pt with lethargy and no functional movement noted in B UEs. Pt reports she is L handed, but no family available to confirm. L gaze preference. Tolerated AA/PROM B UEs. Not following commands consistently. Will follow acutely to address UB exercises with goal of pt being able to return to self feeding.     Follow Up Recommendations  SNF;Supervision/Assistance - 24 hour    Equipment Recommendations       Recommendations for Other Services       Precautions / Restrictions Precautions Precautions: Fall Restrictions Weight Bearing Restrictions: No      Mobility Bed Mobility                  Transfers                      Balance                                            ADL Overall ADL's : Needs assistance/impaired Eating/Feeding: Bed level;Total assistance                                     General ADL Comments: Pt is dependent in bathing, dressing, grooming and toileting at baseline.     Vision Additional Comments: Pt with L gaze preference. Unable to formally test due to lethargy.   Perception     Praxis      Pertinent Vitals/Pain Pain Assessment: Faces Faces Pain Scale: No hurt     Hand Dominance Left   Extremity/Trunk Assessment Upper Extremity Assessment Upper Extremity Assessment: RUE deficits/detail;LUE deficits/detail RUE Deficits / Details: Performed AA/PROM all areas x 10. Shoulder flexion to 100 degrees. RUE Coordination: decreased fine  motor;decreased gross motor LUE Deficits / Details: Performed AA/PROM all areas. Shoulder flexion to 90 before resistance. LUE Coordination: decreased fine motor;decreased gross motor   Lower Extremity Assessment Lower Extremity Assessment: Generalized weakness       Communication Communication Communication: No difficulties   Cognition Arousal/Alertness: Lethargic Behavior During Therapy: Flat affect Overall Cognitive Status: No family/caregiver present to determine baseline cognitive functioning (pt oriented to herself, told me her husband's name)                     General Comments       Exercises       Shoulder Instructions      Home Living Family/patient expects to be discharged to:: Skilled nursing facility                                        Prior Functioning/Environment Level of Independence: Needs assistance  Gait / Transfers Assistance Needed: bedridden ADL's / Homemaking Assistance Needed: assist for bathing, dressing, toileting, was able to self feed with set up  OT Diagnosis: Generalized weakness;Cognitive deficits;Disturbance of vision   OT Problem List: Decreased strength;Decreased range of motion;Decreased activity tolerance;Impaired vision/perception;Decreased coordination;Decreased cognition;Decreased knowledge of use of DME or AE;Impaired UE functional use   OT Treatment/Interventions: Self-care/ADL training;DME and/or AE instruction;Patient/family education    OT Goals(Current goals can be found in the care plan section) Acute Rehab OT Goals OT Goal Formulation: Patient unable to participate in goal setting Time For Goal Achievement: 07/05/16 Potential to Achieve Goals: Fair ADL Goals Pt Will Perform Eating: with mod assist;bed level;with adaptive utensils Pt/caregiver will Perform Home Exercise Program: Increased strength;Both right and left upper extremity;With minimal assist (A/AROM)  OT Frequency: Min  2X/week   Barriers to D/C:            Co-evaluation              End of Session    Activity Tolerance: Patient limited by lethargy Patient left: in bed;with call bell/phone within reach   Time: 1150-1206 OT Time Calculation (min): 16 min Charges:  OT General Charges $OT Visit: 1 Procedure OT Evaluation $OT Eval Moderate Complexity: 1 Procedure G-Codes:    Malka So 06/28/2016, 12:54 PM [phone]

## 2016-06-29 LAB — CBC
HCT: 27.7 % — ABNORMAL LOW (ref 36.0–46.0)
Hemoglobin: 8.9 g/dL — ABNORMAL LOW (ref 12.0–15.0)
MCH: 28.6 pg (ref 26.0–34.0)
MCHC: 32.1 g/dL (ref 30.0–36.0)
MCV: 89.1 fL (ref 78.0–100.0)
PLATELETS: 180 10*3/uL (ref 150–400)
RBC: 3.11 MIL/uL — AB (ref 3.87–5.11)
RDW: 15.2 % (ref 11.5–15.5)
WBC: 6 10*3/uL (ref 4.0–10.5)

## 2016-06-29 LAB — BASIC METABOLIC PANEL
Anion gap: 8 (ref 5–15)
BUN: 32 mg/dL — AB (ref 6–20)
CALCIUM: 7.9 mg/dL — AB (ref 8.9–10.3)
CO2: 20 mmol/L — ABNORMAL LOW (ref 22–32)
CREATININE: 2.39 mg/dL — AB (ref 0.44–1.00)
Chloride: 109 mmol/L (ref 101–111)
GFR calc Af Amer: 23 mL/min — ABNORMAL LOW (ref >60)
GFR, EST NON AFRICAN AMERICAN: 20 mL/min — AB (ref >60)
Glucose, Bld: 90 mg/dL (ref 65–99)
POTASSIUM: 4.2 mmol/L (ref 3.5–5.1)
SODIUM: 137 mmol/L (ref 135–145)

## 2016-06-29 MED ORDER — NITROFURANTOIN MONOHYD MACRO 100 MG PO CAPS: 100.0000 mg | ORAL_CAPSULE | Freq: Two times a day (BID) | ORAL | Status: DC

## 2016-06-29 MED ORDER — FOSFOMYCIN TROMETHAMINE 3 G PO PACK
3.0000 g | PACK | Freq: Once | ORAL | Status: AC
  Administered 2016-06-29: 3 g via ORAL
  Filled 2016-06-29: qty 3

## 2016-06-29 MED ORDER — METOPROLOL TARTRATE 25 MG PO TABS
25.0000 mg | ORAL_TABLET | Freq: Two times a day (BID) | ORAL | Status: AC
Start: 2016-06-29 — End: ?

## 2016-06-29 MED ORDER — DIVALPROEX SODIUM 125 MG PO CSDR: 500.0000 mg | DELAYED_RELEASE_CAPSULE | Freq: Three times a day (TID) | ORAL | Status: AC

## 2016-06-29 MED ORDER — LACOSAMIDE 100 MG PO TABS
100.0000 mg | ORAL_TABLET | Freq: Two times a day (BID) | ORAL | Status: AC
Start: 2016-06-29 — End: ?

## 2016-06-29 NOTE — Progress Notes (Signed)
Speech Language Pathology Treatment: Dysphagia  Patient Details Name: [name] MRN: [id number] DOB: February 04, 1951 Today's Date: 06/29/2016 Time: [id number] SLP Time Calculation (min) (ACUTE ONLY): 14 min  Assessment / Plan / Recommendation Clinical Impression  F/u dysphagia tx and education after yesterday's MBS. Pt for D/C today back to [facility].  Improved toleration of dysphagia 1 diet, nectar-thick liquids.  Explained results of yesterday's test to husband; demonstrated to him how to thicken liquids.  Pt consumed 3 oz with hand-over-hand assist to self-feed; min cues for repeated sub-swallows.  No overt s/s of aspiration.  Rec f/u at SNF for diet progression - anticipate pt will be able to progress back to thin liquids.   HPI HPI: SHANECE HAMBRIC is a 65 y.o. 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 She's been living in the nursing home for 3 years now and is full assist for all ADLs, w/ recent dx in 1/17 of seizure disorder, with PMHx of atrial fibrillation, history of hypertension, chronic 2 L n/c at NH, and chronic kidney disease, presents to our hospital for seizure. Chest x ray reveals Mild asymmetric increased opacification within the right upper lobe and right midlung which may reflect underlying aspiration,      SLP Plan  Discharge SLP treatment due to (comment) (d/c from hospital today to SNF)     Recommendations  Diet recommendations: Dysphagia 1 (puree);Nectar-thick liquid Liquids provided via: Straw Medication Administration: Crushed with puree Supervision: Staff to assist with self feeding;Full supervision/cueing for compensatory strategies Compensations: Minimize environmental distractions;Slow rate;Small sips/bites;Multiple dry swallows after each bite/sip;Other (Comment) Postural Changes and/or Swallow Maneuvers: Seated upright 90 degrees             Plan: Discharge SLP treatment due to (comment) (d/c from hospital  today to SNF)     Wanda. Tivis Ringer, [place] CCC/SLP Pager [phone]  Juan Quam Laurice 06/29/2016, 1:57 PM

## 2016-06-29 NOTE — Discharge Summary (Addendum)
Physician Discharge Summary  [name] [id number] DOB: [birth year] DOA: 06/22/2016  PCP: Kandice Hams, MD  Admit date: 06/22/2016 Discharge date: 06/29/2016  Admitted From; SNF Disposition: SNF  Recommendations for Outpatient Follow-up:  1. Follow up with PCP in 1-2 weeks 2. Please obtain BMP/CBC in one week 3. Need to be follow by palliative care   Equipment/Devices:on oxygen   Discharge Condition: stable.  CODE STATUS: partial code, no intubation.  Diet recommendation:  Dysphagia , nectar thick   Brief/Interim Summary: Patient is a 65 year old female bedbound for the past 3 years nursing home resident with history of severe debility, atrial fibrillation, hypertension, chronic kidney disease stage III, obstructive sleep apnea, CHF, morbid obesity, history of seizures presented to the ED with status epilepticus. Patient initially loaded with Keppra and placed on IV Keppra twice daily. Patient had also presented with shortness of breath and tachypnea. Due to patient's mobility status, presentation was shortness of breath and tachypnea concern was for PE and a such VQ scan was ordered. On route to VQ scan patient noted to have another episode of seizures and had to be loaded with IV Depakote and IV Keppra and doses of Keppra increased and patient placed on Depakote as well as Vimpat. Patient also on heparin drip. Patient opens eyes to verbal stimuli however not following commands and nonresponsive which her husband is not her baseline. Patient also noted on the night of 06/22/2016 went into SVT requiring adenosine and now on scheduled IV Lopressor. Neurology and cardiology have been consulted. Patient underwent EEG which was consistent with status epilepticus which improved daily. Patient subsequently underwent MRI of the head with findings consistent with status epilepticus versus PRES. goal blood pressure systolic less than [id number]. 2-D echo done did show a mild to moderate pericardial  effusion per cardiology.    1- Status epilepticus VS PRES Patient presented with generalized tonic-clonic seizures after episode of confusion and disorientation. Patient was given a loading dose of Keppra and placed on Keppra 750 mg twice daily. CT head which was done showed prior bilateral occipital ischemic changes. There was also concern that patient may have had arrhythmias which may have led to decreased cerebral perfusion provoking seizures secondary to bilateral occipital ischemic changes noted. The night of 06/22/2016, patient was noted to have episode of seizures on route to VQ scan and received Ativan at that time. Neurology was called and patient was loaded with 1 g of IV Depakote followed by 500 mg of IV Depakote twice daily as well as IV Keppra and Keppra dose increased to 1 g IV twice daily. Patient was also given IV Vimpat and placed on 100 mg of Vimpat twice daily. Patient with no signs of infectious etiology. Patient with low-grade fever this morning and also leukocytosis however these may have likely been secondary to seizure episodes. Leukocytosis trending down. EEG consistent with status epilepticus. Concern for possible acute ischemic CVA and as such MRI of the brain was done 06/24/2016 that showed extensive multifocal T2/FLAIR signal abnormality involving bilateral cerebral hemispheres as well as cerebellum with associated patchy diffusion abnormality likely related to status epilepticus and/or. PRES. No acute vascular infarct.remote bilateral PCA territory infarcts with remote left pontine lacunar infarct. Underlying mild chronic small vessel ischemic disease.  -Valproic acid level decreased at 29. -Continue Norvasc and hydralazine and Isordil . -Goal blood pressure systolic less than [id number]. -Continue Vimpat, Keppra, Depakote. Per neurology. -slowly improving.   2 SVT Patient noted to go into SVT the night of 06/22/2016,  and had to be given adenosine 6 mg IV 1 with improvement and  subsequently placed on scheduled Lopressor.TSH within normal limits at 1.301. 2-D echo with EF of 50-55% with grade 1 diastolic dysfunction and mild to moderate pericardial effusion with no signs of tamponade..Cardiology sign off.  Continue with metoprolol.   3 mild to moderate pericardial effusion Per 2-D echo. No signs of tamponade. Monitor. Per cardiology.  4 Paroxysmal atrial fibrillation Currently rate controlled on scheduled IV Lopressor. Patient NOT on anticoagulation in the past secondary to history of GI bleed and hematoma. Discontinued IV heparin due to bleeding hematoma noted this morning.   5 Shortness of breath/tachypnea Patient presented with shortness of breath and tachypnea with a history of being bedbound for approximately 3 years. Concern for pulmonary emboli. VQ scan was ordered however on route to VQ scan patient had seizures and a such VQ scan is still pending. D/C IV heparin drip. Repeat chest x-ray 06/23/2016 with no significant change. Follow. 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 Continue scheduled IV Lopressor. Hydralazine as needed.  -Continue with of Norvasc, hydralazine, Isordil .  - Goal systolic blood pressure less than 140. Hydralazine when necessary.  7 Acute on chronic kidney disease stage III, cr one year ago at 2.0 Likely secondary to prerenal azotemia. Patient noted to have acute blood loss secondary to hematoma with hemoglobin at 6.5. -renal US negative for hydronephrosis.  -IV fluids. Follow trend.  -Hypernatremia; Continue with IV fluids to D 5. Improved.  -cr trending down at 2.3.  8 Staph coagulase negative 2/2  UA with too numerous to count WBC. Will follow urine culture.  Blood culture 7-15 aerobic growing gram positive rods. Considering contaminant.  Blood culture 7-19 growing 2 out of 2 staph coagulase. Started vancomycin 7-19 ID consulted.  Appreciate recommendation.  Received vancomycin for 3 days per ID.  Urine culture growing klebsiella, follow sensitivity to give one more day of antibiotics. fosfomycin given today to complete 3 days   9 combined chronic systolic and diastolic heart failure Currently stable.  10 left lower extremity bleeding hematoma Patient noted to have a bleeding/ruptured hematoma the morning of 06/24/2016 and pressure dressing in place. No further bleeding per nursing. Discontinued IV heparin. Patient with ruptured/bleeding hematoma the morning of 06/24/2016 and as such anticoagulation was discontinued.  Patient with prior history of GI bleed and hematoma and a such on NO anticoagulation for A. fib. Heparin has been discontinued.   11 ABLA Likely secondary to ruptured LLE hematoma. HGB 6.5 from 9.7. S/P transfuse 2 units PRBCs 7-18. Repeat hb tonight   12 prognosis Patient has been bedridden for the past 3 years with comorbidities admitted with status epilepticus. Patient also noted to go into SVT during the hospitalization. Patient had MRI done with findings concerning for status epilepticus versus PRES. patient indeed has PRES will need tighter blood pressure control with goal systolic blood pressure less than 140. Patient was on hospice prior to admission however was a full code. Discussed with patient's husband in terms of prognosis and depending on neurology's evaluation will see how patient does over the next few days and if no significant improvement or deterioration may consider palliative care consult for goals of care. If patient indeed has PRES will likely take several weeks to see full recovery and in that case could have palliative care goals of care done in the outpatient setting. -husband would like to continue  with current treatments.    Discharge Diagnoses:    Status epilepticus ([place])   PRESS   UTI   Transient staph  Coagulase negative bacteremia   PAF (paroxysmal atrial  fibrillation) (HCC)   Chronic combined systolic and diastolic CHF (congestive heart failure) (HCC)   SOB (shortness of breath)   Total self-care deficit   AKI (acute kidney injury) (HCC)   CKD (chronic kidney disease) stage 3, GFR 30-59 ml/min   Malignant HTN with heart disease, w/o CHF, with chronic kidney disease   SVT (supraventricular tachycardia) (HCC)   Hematoma of left lower extremity   Pericardial effusion   Acute kidney injury superimposed on CKD ([place])   PRES (posterior reversible encephalopathy syndrome): Possible   Malnutrition of moderate degree    Discharge Instructions  Discharge Instructions    Diet - low sodium heart healthy    Complete by:  As directed      Increase activity slowly    Complete by:  As directed             Medication List    STOP taking these medications        LORazepam 1 MG tablet  Commonly known as:  ATIVAN      TAKE these medications        acetaminophen 500 MG tablet  Commonly known as:  TYLENOL  Take 500 mg by mouth every 6 (six) hours.     albuterol (2.5 MG/3ML) 0.083% nebulizer solution  Commonly known as:  PROVENTIL  Take 2.5 mg by nebulization every 6 (six) hours as needed for wheezing or shortness of breath.     allopurinol 100 MG tablet  Commonly known as:  ZYLOPRIM  Take 100 mg by mouth daily. Patient takes at 0800am daily     amLODipine 10 MG tablet  Commonly known as:  NORVASC  Take 10 mg by mouth at bedtime.     atorvastatin 20 MG tablet  Commonly known as:  LIPITOR  Take 20 mg by mouth at bedtime. Patient takes at 2100     cyanocobalamin 1000 MCG/ML injection  Commonly known as:  (VITAMIN B-12)  Inject 1,000 mcg into the muscle every 30 (thirty) days.     divalproex 125 MG capsule  Commonly known as:  DEPAKOTE SPRINKLE  Take 4 capsules (500 mg total) by mouth every 8 (eight) hours.     feeding supplement (PRO-STAT SUGAR FREE 64) Liqd  Take 30 mLs by mouth 2 (two) times daily.     feeding supplement  Liqd  Take 1 Container by mouth 2 (two) times daily between meals. Reported on 02/23/2016     ferrous sulfate 325 (65 FE) MG tablet  Take 325 mg by mouth 3 (three) times daily with meals.     hydrALAZINE 100 MG tablet  Commonly known as:  APRESOLINE  Take 100 mg by mouth 3 (three) times daily.     isosorbide dinitrate 30 MG tablet  Commonly known as:  ISORDIL  Take 1 tablet (30 mg total) by mouth 2 (two) times daily.     Lacosamide 100 MG Tabs  Take 1 tablet (100 mg total) by mouth 2 (two) times daily.     levETIRAcetam 500 MG tablet  Commonly known as:  KEPPRA  Take 1 tablet (500 mg total) by mouth 2 (two) times daily.     metoCLOPramide 5 MG tablet  Commonly known as:  REGLAN  Take 5 mg by mouth 3 (three) times daily.  metoprolol tartrate 25 MG tablet  Commonly known as:  LOPRESSOR  Take 1 tablet (25 mg total) by mouth 2 (two) times daily.     omeprazole 20 MG capsule  Commonly known as:  PRILOSEC  Take 20 mg by mouth 2 (two) times daily before a meal.     OXYGEN  Inhale 2 L into the lungs continuous.     polyethylene glycol packet  Commonly known as:  MIRALAX / GLYCOLAX  Take 17 g by mouth 2 (two) times daily.     SYSTANE 0.4-0.3 % Soln  Generic drug:  Polyethyl Glycol-Propyl Glycol  Apply 1 drop to eye 4 (four) times daily.     traZODone 50 MG tablet  Commonly known as:  DESYREL  Take 50 mg by mouth at bedtime.     Vitamin D (Ergocalciferol) 50000 units Caps capsule  Commonly known as:  DRISDOL  Take 50,000 Units by mouth 3 (three) times a week.           Follow-up Information    Follow up with [facility] .   Specialty:  Skilled Nursing Facility   Contact information:   [place] [address] [phone]     Allergies  Allergen Reactions  . Daptomycin Other (See Comments)    Elevated CPK  . Morphine And Related Nausea And Vomiting  . Cephalexin Rash  . Tramadol Nausea And Vomiting  . Atenolol      unknown  . Cephalosporins Other (See Comments)    Unknown  . Dapsone Other (See Comments)    unknown  . Erythromycin     unknown  . Erythromycin Base Other (See Comments)    Unknown  . Celecoxib Other (See Comments)    Unknown, can use furosemide without issue  . Codeine Other (See Comments)    unknown  . Fluoxetine Hcl Other (See Comments)    unknown  . Latex Other (See Comments)  . Ofloxacin Other (See Comments)    unknown  . Penicillins Other (See Comments)    Unknown  Has patient had a PCN reaction causing immediate rash, facial/tongue/throat swelling, SOB or lightheadedness with hypotension: unknown Has patient had a PCN reaction causing severe rash involving mucus membranes or skin necrosis: unknown Has patient had a PCN reaction that required hospitalization unknown Has patient had a PCN reaction occurring within the last 10 years: unknown If all of the above answers are "NO", then may proceed with Cephalosporin use.  [name] Rofecoxib Other (See Comments)    unknown  . Sulfonamide Derivatives Other (See Comments)    unknown    Consultations:  Neurology   ID   Procedures/Studies: Ct Head Wo Contrast  06/22/2016  CLINICAL DATA:  Unresponsive at nursing home.  Seizures. EXAM: CT HEAD WITHOUT CONTRAST TECHNIQUE: Contiguous axial images were obtained from the base of the skull through the vertex without intravenous contrast. 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 There is no evidence for acute hemorrhage, hydrocephalus, mass lesion, or abnormal extra-axial fluid collection. No definite CT evidence for acute infarction. Diffuse loss of parenchymal volume is consistent with atrophy. Patchy low attenuation in the deep hemispheric and periventricular white matter is nonspecific, but likely reflects chronic microvascular ischemic demyelination. The visualized paranasal sinuses and mastoid air cells are clear.  IMPRESSION: Stable.  Atrophy with chronic small vessel white matter disease. 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 Electronically Signed   By: Verda Cumins.D.  On: 06/22/2016 13:48   Mr Brain Wo Contrast  06/24/2016  CLINICAL DATA:  Initial evaluation for left-sided weakness and right-sidedPLEDs status post prolonged seizure. EXAM: MRI HEAD WITHOUT CONTRAST TECHNIQUE: Multiplanar, multiecho pulse sequences of the brain and surrounding structures were obtained without intravenous contrast. COMPARISON:  Prior CT from 06/22/2016 as well as previous MRI from 07/24/2007. FINDINGS: Diffuse prominence of the CSF containing spaces is compatible with generalized cerebral atrophy, somewhat advanced for patient age. Encephalomalacia within the right temporal occipital region consistent with remote right PCA territory infarct. Small area of encephalomalacia within the parasagittal left occipital lobe consistent with remote left PCA territory infarct. Remote lacunar infarct within the left paramedian pons. There is extensive multi focal abnormal T2/FLAIR signal abnormality involving the bilateral cerebral hemispheres, left greater than right. This predominantly involves the deep and subcortical white matter, with probable some area tip patchy gray matter involvement. Frontal, parietal,, occipital, and temporal lobes affected. There is abnormal T2/FLAIR signal intensity within the bilateral cerebellar hemispheres. Abnormal signal intensity extends along the posterior limb of the left internal capsule. Otherwise relative sparing of the deep gray nuclei and brainstem. There is scattered patchy areas of restricted diffusion within knee many of these areas, particularly near the vertex (series 5, image 37). Additional subtle diffusion abnormality within the bilateral cerebellar hemispheres. Major intracranial vascular flow voids are preserved. No acute intracranial hemorrhage. No mass lesion, midline shift, or mass effect. No  hydrocephalus. No extra-axial fluid collection. Major dural sinuses are grossly patent. Craniocervical junction within normal limits. Degenerative spondylolysis at C3-4 with resultant moderate canal stenosis. Pituitary gland normal. No acute abnormality about the globes and orbits. Patient is status post lens extraction bilaterally. Scattered mucosal thickening throughout the ethmoidal air cells and maxillary sinuses. No air-fluid level to suggest active sinus infection. Trace opacity bilateral mastoid air cells. Inner ear structures grossly normal. Bone marrow signal intensity within normal limits. No scalp soft tissue abnormality. Findings favored to reflect PRES given the history of seizure. Superimposed changes related to the prolonged seizure may also be present. No evidence for acute vascular infarct. IMPRESSION: 1. Extensive multifocal T2/FLAIR signal abnormality involving the bilateral cerebral hemispheres as well as the cerebellum as above with associated patchy diffusion abnormality. Given the provided history, primary differential considerations include changes related to status epilepticus and/or PRES. 2. No acute vascular infarct identified. 3. Remote bilateral PCA territory infarcts with remote left pontine lacunar infarct. 4. Underlying mild chronic small vessel ischemic disease. Electronically Signed   By: Jeannine Boga M.D.   On: 06/24/2016 23:57   US Renal  06/25/2016  CLINICAL DATA:  Patient with acute renal failure. Elevated BUN and creatinine on laboratory analysis. EXAM: RENAL / URINARY TRACT ULTRASOUND COMPLETE COMPARISON:  Ultrasound present renal ultrasound 08/11/2013 FINDINGS: Right Kidney: Length: 9.2 cm. Renal cortical thinning. Increased renal cortical echogenicity. Multiple hypoechoic masses most compatible with cyst measuring up to 1.8 cm. No hydronephrosis. Left Kidney: Length: 12.9 cm. Normal renal cortical thickness and echogenicity. No hydronephrosis. Off of the inferior  pole of the left kidney there is a 3.4 cm cyst. Bladder: Not well visualized. Incidental note is made of a right pleural effusion. IMPRESSION: No hydronephrosis. Atrophic right kidney. Electronically Signed   By: Lovey Newcomer M.D.   On: 06/25/2016 15:55   Nm Pulmonary Perf And Vent  06/26/2016  CLINICAL DATA:  Shortness of breath. EXAM: NUCLEAR MEDICINE VENTILATION - PERFUSION LUNG SCAN TECHNIQUE: Ventilation images were obtained in multiple projections using inhaled aerosol Tc-66m DTPA.  Perfusion images were obtained in multiple projections after intravenous injection of Tc-15m MAA. RADIOPHARMACEUTICALS:  Thirty-three MCi Technetium-3m DTPA aerosol inhalation and 4.2 mCi Technetium-14m MAA IV COMPARISON:  Chest x-ray 06/23/2016 known FINDINGS: Ventilation: No focal ventilation defect. Perfusion: No wedge shaped peripheral perfusion defects to suggest acute pulmonary embolism. Note: Lateral images were not obtained secondary to patient inability to elevate arms. IMPRESSION: Normal bilateral lung perfusion. Electronically Signed   By: Misty Stanley M.D.   On: 06/26/2016 12:37   Dg Chest Port 1 View  06/23/2016  CLINICAL DATA:  Sob per chart, pt unable to give any history EXAM: PORTABLE CHEST 1 VIEW COMPARISON:  06/22/2016 FINDINGS: Heart is enlarged. There are persistent bilateral pulmonary infiltrates, probably stable in appearance accounting for differences in technique. Right pleural effusion or pleural thickening again noted. Bilateral costophrenic angle blunting again noted. IMPRESSION: 1. Cardiomegaly. 2. Persistent bilateral pleural changes and parenchymal opacities. Electronically Signed   By: Nolon Nations M.D.   On: 06/23/2016 10:47   Dg Chest Port 1 View  06/22/2016  CLINICAL DATA:  Grand mal seizure.  Possible aspiration. EXAM: PORTABLE CHEST 1 VIEW COMPARISON:  01/03/2016 FINDINGS: There is moderate cardiac enlargement. Aortic atherosclerosis noted. Asymmetric opacification within the right  midlung and right upper lobe is identify. Mild diffuse pulmonary vascular congestion noted. IMPRESSION: 1. Cardiac enlargement and aortic atherosclerosis. 2. Mild asymmetric increased opacification within the right upper lobe and right midlung which may reflect underlying aspiration, pneumonia or asymmetric edema. Electronically Signed   By: Kerby Moors M.D.   On: 06/22/2016 14:28   Dg Swallowing Func-speech Pathology  06/28/2016  Objective Swallowing Evaluation: Type of Study: MBS-Modified Barium Swallow Study Patient Details Name: [name] MRN: [id number] Date of Birth: [birth year] Today's Date: 06/28/2016 Time: SLP Start Time (ACUTE ONLY): 1550-SLP Stop Time (ACUTE ONLY): 1610 SLP Time Calculation (min) (ACUTE ONLY): 20 min Past Medical History: Past Medical History Diagnosis Date . Asthma  . Bronchitis  . Allergic rhinitis  . HTN (hypertension)  . Hyperlipidemia  . Obesity  . OSA (obstructive sleep apnea)    poor compliance with cpap . CHF (congestive heart failure) ([place])  . Biventricular failure ([place])    compensated . Psoriasis  . Stasis edema    of lower extremities . Depression  . Situational stress  . Anemia  . Gout  . Chronic anticoagulation    off due to GIB, thigh hematoma . Yeast infection involving the vagina and surrounding area  . Atrial fibrillation with RVR ([place])  . GERD (gastroesophageal reflux disease)  . Chronic bronchitis ([place])    "get it q yr" (08/10/2013) . Shortness of breath    "all the time" (08/10/2013) . On home oxygen therapy    "2L during the night and prn during the day" (08/10/2013) . Type II diabetes mellitus ([place])  . History of blood transfusion    "few during my lifetime; last time was 4 days ago when I got 2 units" (08/10/2013) . Stroke [facility]) ?2007   denies residuals on 08/10/2013 . DJD (degenerative joint disease)  . Bleeding    "from my skin folds; just won't stop" (08/10/2013) . Complication of anesthesia    "they gave me too much; I stayed out of it for awhile" (08/10/2013) .  Anxiety  . Kidney stones  . Kidney disease, chronic, stage III (GFR 30-59 ml/min)  . Chronic kidney disease, stage IV (severe) ([place])  . Morbid obesity ([place])  . Diastolic CHF ([place])  . Pneumonia    "  said I did on 08/03/2013 then they ruled it out" (08/10/2013) . HCAP (healthcare-associated pneumonia) 01/03/2016 . Seizures ([place])  Past Surgical History: Past Surgical History Procedure Laterality Date . Appendectomy   . Cholecystectomy   . Total knee arthroplasty Left ~ 2006 . Total abdominal hysterectomy   . Back surgery   . Cystectomy Left    hand . Tonsillectomy   . Joint replacement   . Lumbar disc surgery   . Colonoscopy N/A 08/16/2013   Procedure: COLONOSCOPY;  Surgeon: Beryle Beams, MD;  Location: [facility];  Service: Endoscopy;  Laterality: N/A; . Hematoma evacuation Right 09/10/2013   Procedure: EVACUATION OF RIGHT LEG HEMATOMA AND EXCISION [place] RIGHT LEG ;  Surgeon: Merrie Roof, MD;  Location: [facility];  Service: General;  Laterality: Right; . Debridement and closure wound Right 09/22/2013   Procedure: IRRIGATION AND DEBRIDEMENT SURGICAL PREP PLACEMENT OF [place]  ;  Surgeon: Irene Limbo, MD;  Location: [facility];  Service: Plastics;  Laterality: Right; . Skin split graft Right 10/07/2013   Procedure: SPLIT THICKNESS SKIN GRAFT RIGHT THIGH TO RIGHT LEG, PLACEMENT OF A CEL AND WOUND VAC TO RIGHT THIGH  ;  Surgeon: Irene Limbo, MD;  Location: [facility];  Service: Plastics;  Laterality: Right; HPI: AROHA BUFANO is a 65 y.o. 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 She's been living in the nursing home for 3 years now and is full assist for all ADLs, w/ recent dx in 1/17 of seizure disorder, with PMHx of atrial fibrillation, history of hypertension, chronic 2 L n/c at NH, and chronic kidney disease, presents to our hospital for seizure. Chest x ray reveals Mild asymmetric increased opacification within the right upper lobe and right midlung which may  reflect underlying aspiration, Subjective: Pt resting in bed. Husband present. He reports pt was tolerating chopped solids and thin liquids prior to admit. Assessment / Plan / Recommendation CHL IP CLINICAL IMPRESSIONS 06/28/2016 Therapy Diagnosis Mild oral phase dysphagia;Mild pharyngeal phase dysphagia Clinical Impression Pt currently with a mild sensory oropharyngeal dysphagia. Pt with poor bolus containment with premature spillage to the level of the pyriform sinuses across consistencies, resulting in flash penetration of thin liquids just prior to pharyngeal swallow initiation. No penetration/ aspiration noted with nectar-thick liquids. Pt with excellent hyolaryngeal excursion and no pharyngeal residue noted post-swallow. Across consistencies pt had piecemeal swallowing, occasionally requiring cues to swallow a 2nd time as liquid/ puree pooled from oral cavity into the vallecula post-swallow. Pt has had a wet/ congested cough all day today; however, it is worth noting that pt coughed post-swallow during study with no penetration/ aspiration noted. Recommend re-initiating dysphagia 1 diet (due to piecemeal swallowing and needing cues to swallow oral residuals), nectar thick liquids, meds crushed in puree, full supervision to cue small bites/ sips, cue pt to swallow 2 times with each bolus to clear oral residuals. Ensure pt fully upright and alert during all PO intake. Will continue to follow to check diet tolerance. Impact on safety and function Mild aspiration risk;Moderate aspiration risk   CHL IP TREATMENT RECOMMENDATION 06/28/2016 Treatment Recommendations Therapy as outlined in treatment plan below   Prognosis 06/28/2016 Prognosis for Safe Diet Advancement Fair Barriers to Reach Goals Cognitive deficits;Severity of deficits Barriers/Prognosis Comment -- [facility] IP DIET RECOMMENDATION 06/28/2016 SLP Diet Recommendations Dysphagia 1 (Puree) solids;Nectar thick liquid Liquid Administration via Straw Medication  Administration Crushed with puree Compensations Minimize environmental distractions;Slow rate;Small sips/bites;Multiple dry swallows after  each bite/sip;Other (Comment) Postural Changes Seated upright at 90 degrees;Remain semi-upright after after feeds/meals (Comment)   [facility] IP OTHER RECOMMENDATIONS 06/28/2016 Recommended Consults -- Oral Care Recommendations Oral care BID Other Recommendations Have oral suction available   [facility] IP FOLLOW UP RECOMMENDATIONS 06/28/2016 Follow up Recommendations 24 hour supervision/assistance   [facility] IP FREQUENCY AND DURATION 06/28/2016 Speech Therapy Frequency (ACUTE ONLY) min 2x/week Treatment Duration 1 week      [facility] IP ORAL PHASE 06/28/2016 Oral Phase Impaired Oral - Pudding Teaspoon -- Oral - Pudding Cup -- Oral - Honey Teaspoon -- Oral - Honey Cup -- Oral - Nectar Teaspoon -- Oral - Nectar Cup -- Oral - Nectar Straw Piecemeal swallowing;Premature spillage;Delayed oral transit Oral - Thin Teaspoon -- Oral - Thin Cup -- Oral - Thin Straw Delayed oral transit;Premature spillage Oral - Puree Lingual/palatal residue;Piecemeal swallowing;Premature spillage Oral - Mech Soft -- Oral - Regular Impaired mastication;Piecemeal swallowing;Lingual/palatal residue;Premature spillage Oral - Multi-Consistency -- Oral - Pill -- Oral Phase - Comment --  [facility] IP PHARYNGEAL PHASE 06/28/2016 Pharyngeal Phase Impaired Pharyngeal- Pudding Teaspoon -- Pharyngeal -- Pharyngeal- Pudding Cup -- Pharyngeal -- Pharyngeal- Honey Teaspoon -- Pharyngeal -- Pharyngeal- Honey Cup -- Pharyngeal -- Pharyngeal- Nectar Teaspoon -- Pharyngeal -- Pharyngeal- Nectar Cup -- Pharyngeal -- Pharyngeal- Nectar Straw Delayed swallow initiation-pyriform sinuses Pharyngeal -- Pharyngeal- Thin Teaspoon -- Pharyngeal -- Pharyngeal- Thin Cup -- Pharyngeal -- Pharyngeal- Thin Straw Delayed swallow initiation-pyriform sinuses;Penetration/Aspiration before swallow Pharyngeal Material enters airway, remains ABOVE vocal cords then ejected out  Pharyngeal- Puree Delayed swallow initiation-pyriform sinuses Pharyngeal -- Pharyngeal- Mechanical Soft -- Pharyngeal -- Pharyngeal- Regular Delayed swallow initiation-pyriform sinuses Pharyngeal -- Pharyngeal- Multi-consistency -- Pharyngeal -- Pharyngeal- Pill -- Pharyngeal -- Pharyngeal Comment --  [facility] IP CERVICAL ESOPHAGEAL PHASE 06/28/2016 Cervical Esophageal Phase WFL Pudding Teaspoon -- Pudding Cup -- Honey Teaspoon -- Honey Cup -- Nectar Teaspoon -- Nectar Cup -- Nectar Straw -- Thin Teaspoon -- Thin Cup -- Thin Straw -- Puree -- Mechanical Soft -- Regular -- Multi-consistency -- Pill -- Cervical Esophageal Comment -- No flowsheet data found. Kern Reap, MA, CCC-SLP 06/28/2016, 4:40 PM [phone]                 Subjective: Patient is breathing well, oxygen sat 94 % on ra , she is not in any distress  Discharge Exam: Filed Vitals:   06/29/16 0042 06/29/16 0608  BP: 148/54 154/59  Pulse: 69 67  Temp: 98.7 F (37.1 C) 98.3 F (36.8 C)  Resp: 20 21   Filed Vitals:   06/28/16 1525 06/28/16 1940 06/29/16 0042 06/29/16 0608  BP: 144/46 151/50 148/54 154/59  Pulse: 61 71 69 67  Temp: 98.6 F (37 C) 98.6 F (37 C) 98.7 F (37.1 C) 98.3 F (36.8 C)  TempSrc: Oral Oral Oral Oral  Resp: 21 20 20 21   Height:      Weight:    80.377 kg (177 lb 3.2 oz)  SpO2: 98% 98% 97% 97%    General: Pt is alert, awake, not in acute distress Cardiovascular: RRR, S1/S2 +, no rubs, no gallops Respiratory: CTA bilaterally, no wheezing, no rhonchi Abdominal: Soft, NT, ND, bowel sounds + Extremities: no edema, no cyanosis    The results of significant diagnostics from this hospitalization (including imaging, microbiology, ancillary and laboratory) are listed below for reference.     Microbiology: Recent Results (from the past 240 hour(s))  Blood Culture (routine x 2)     Status: Abnormal   Collection Time: 06/22/16  2:01 PM  Result Value Ref Range Status   Specimen Description BLOOD LEFT  WRIST  Final   Special Requests BOTTLES DRAWN AEROBIC ONLY 5CC  Final   Culture  Setup Time   Final    GRAM POSITIVE RODS AEROBIC BOTTLE ONLY CRITICAL VALUE NOTED.  VALUE IS CONSISTENT WITH PREVIOUSLY REPORTED AND CALLED VALUE.    Culture (A)  Final    DIPHTHEROIDS(CORYNEBACTERIUM SPECIES) Standardized susceptibility testing for this organism is not available.    Report Status 06/27/2016 FINAL  Final  Blood Culture (routine x 2)     Status: Abnormal   Collection Time: 06/22/16  2:05 PM  Result Value Ref Range Status   Specimen Description BLOOD LEFT HAND  Final   Special Requests BOTTLES DRAWN AEROBIC ONLY 5CC  Final   Culture  Setup Time   Final    GRAM POSITIVE RODS AEROBIC BOTTLE ONLY CRITICAL RESULT CALLED TO, READ BACK BY AND VERIFIED WITH: M TURNER,PHARMD AT 1522 06/24/16 BY L BENFIELD    Culture (A)  Final    DIPHTHEROIDS(CORYNEBACTERIUM SPECIES) Standardized susceptibility testing for this organism is not available.    Report Status 06/26/2016 FINAL  Final  Blood Culture ID Panel (Reflexed)     Status: None   Collection Time: 06/22/16  2:05 PM  Result Value Ref Range Status   Enterococcus species NOT DETECTED NOT DETECTED Final   Vancomycin resistance NOT DETECTED NOT DETECTED Final   Listeria monocytogenes NOT DETECTED NOT DETECTED Final   Staphylococcus species NOT DETECTED NOT DETECTED Final   Staphylococcus aureus NOT DETECTED NOT DETECTED Final   Methicillin resistance NOT DETECTED NOT DETECTED Final   Streptococcus species NOT DETECTED NOT DETECTED Final   Streptococcus agalactiae NOT DETECTED NOT DETECTED Final   Streptococcus pneumoniae NOT DETECTED NOT DETECTED Final   Streptococcus pyogenes NOT DETECTED NOT DETECTED Final   Acinetobacter baumannii NOT DETECTED NOT DETECTED Final   Enterobacteriaceae species NOT DETECTED NOT DETECTED Final   Enterobacter cloacae complex NOT DETECTED NOT DETECTED Final   Escherichia coli NOT DETECTED NOT DETECTED Final    Klebsiella oxytoca NOT DETECTED NOT DETECTED Final   Klebsiella pneumoniae NOT DETECTED NOT DETECTED Final   Proteus species NOT DETECTED NOT DETECTED Final   Serratia marcescens NOT DETECTED NOT DETECTED Final   Carbapenem resistance NOT DETECTED NOT DETECTED Final   Haemophilus influenzae NOT DETECTED NOT DETECTED Final   Neisseria meningitidis NOT DETECTED NOT DETECTED Final   Pseudomonas aeruginosa NOT DETECTED NOT DETECTED Final   Candida albicans NOT DETECTED NOT DETECTED Final   Candida glabrata NOT DETECTED NOT DETECTED Final   Candida krusei NOT DETECTED NOT DETECTED Final   Candida parapsilosis NOT DETECTED NOT DETECTED Final   Candida tropicalis NOT DETECTED NOT DETECTED Final  Urine culture     Status: Abnormal   Collection Time: 06/22/16  3:52 PM  Result Value Ref Range Status   Specimen Description URINE, CATHETERIZED  Final   Special Requests NONE  Final   Culture MULTIPLE SPECIES PRESENT, SUGGEST RECOLLECTION (A)  Final   Report Status 06/23/2016 FINAL  Final  MRSA PCR Screening     Status: None   Collection Time: 06/23/16  1:03 AM  Result Value Ref Range Status   MRSA by PCR NEGATIVE NEGATIVE Final    Comment:        The GeneXpert MRSA Assay (FDA approved for NASAL specimens only), is one component of a comprehensive MRSA colonization surveillance program. It is not intended to diagnose MRSA  infection nor to guide or monitor treatment for MRSA infections.   Urine culture     Status: Abnormal   Collection Time: 06/25/16  6:11 PM  Result Value Ref Range Status   Specimen Description URINE, CATHETERIZED  Final   Special Requests NONE  Final   Culture >=100,000 COLONIES/mL KLEBSIELLA PNEUMONIAE (A)  Final   Report Status 06/28/2016 FINAL  Final   Organism ID, Bacteria KLEBSIELLA PNEUMONIAE (A)  Final      Susceptibility   Klebsiella pneumoniae - MIC*    AMPICILLIN >=32 RESISTANT Resistant     CEFAZOLIN >=64 RESISTANT Resistant     CEFTRIAXONE >=64 RESISTANT  Resistant     CIPROFLOXACIN >=4 RESISTANT Resistant     GENTAMICIN <=1 SENSITIVE Sensitive     IMIPENEM <=0.25 SENSITIVE Sensitive     NITROFURANTOIN 64 INTERMEDIATE Intermediate     TRIMETH/SULFA >=320 RESISTANT Resistant     AMPICILLIN/SULBACTAM >=32 RESISTANT Resistant     PIP/TAZO 8 SENSITIVE Sensitive     * >=100,000 COLONIES/mL KLEBSIELLA PNEUMONIAE  Culture, blood (routine x 2)     Status: Abnormal (Preliminary result)   Collection Time: 06/26/16 10:00 AM  Result Value Ref Range Status   Specimen Description BLOOD LEFT HAND  Final   Special Requests BOTTLES DRAWN AEROBIC ONLY 5CC  Final   Culture  Setup Time   Final    GRAM POSITIVE COCCI IN CLUSTERS AEROBIC BOTTLE ONLY CRITICAL RESULT CALLED TO, READ BACK BY AND VERIFIED WITH: Wynona Neat, PHARM AT [id number] ON [id number] BY M. KELLY    Culture STAPHYLOCOCCUS SPECIES (COAGULASE NEGATIVE) (A)  Final   Report Status PENDING  Incomplete  Blood Culture ID Panel (Reflexed)     Status: Abnormal   Collection Time: 06/26/16 10:00 AM  Result Value Ref Range Status   Enterococcus species NOT DETECTED NOT DETECTED Final   Vancomycin resistance NOT DETECTED NOT DETECTED Final   Listeria monocytogenes NOT DETECTED NOT DETECTED Final   Staphylococcus species DETECTED (A) NOT DETECTED Final    Comment: CRITICAL RESULT CALLED TO, READ BACK BY AND VERIFIED WITH: VERONDA BRYK,PHARMD @0635  06/27/16 MKELLY    Staphylococcus aureus NOT DETECTED NOT DETECTED Final   Methicillin resistance DETECTED (A) NOT DETECTED Final    Comment: CRITICAL RESULT CALLED TO, READ BACK BY AND VERIFIED WITH: VERONDA BRYK, PHARMD @0635  06/27/16 MKELLY    Streptococcus species NOT DETECTED NOT DETECTED Final   Streptococcus agalactiae NOT DETECTED NOT DETECTED Final   Streptococcus pneumoniae NOT DETECTED NOT DETECTED Final   Streptococcus pyogenes NOT DETECTED NOT DETECTED Final   Acinetobacter baumannii NOT DETECTED NOT DETECTED Final   Enterobacteriaceae species  NOT DETECTED NOT DETECTED Final   Enterobacter cloacae complex NOT DETECTED NOT DETECTED Final   Escherichia coli NOT DETECTED NOT DETECTED Final   Klebsiella oxytoca NOT DETECTED NOT DETECTED Final   Klebsiella pneumoniae NOT DETECTED NOT DETECTED Final   Proteus species NOT DETECTED NOT DETECTED Final   Serratia marcescens NOT DETECTED NOT DETECTED Final   Carbapenem resistance NOT DETECTED NOT DETECTED Final   Haemophilus influenzae NOT DETECTED NOT DETECTED Final   Neisseria meningitidis NOT DETECTED NOT DETECTED Final   Pseudomonas aeruginosa NOT DETECTED NOT DETECTED Final   Candida albicans NOT DETECTED NOT DETECTED Final   Candida glabrata NOT DETECTED NOT DETECTED Final   Candida krusei NOT DETECTED NOT DETECTED Final   Candida parapsilosis NOT DETECTED NOT DETECTED Final   Candida tropicalis NOT DETECTED NOT DETECTED Final  Culture, blood (routine x 2)  Status: None (Preliminary result)   Collection Time: 06/26/16 10:10 AM  Result Value Ref Range Status   Specimen Description BLOOD RIGHT HAND  Final   Special Requests BOTTLES DRAWN AEROBIC ONLY 5CC  Final   Culture  Setup Time   Final    GRAM POSITIVE COCCI IN CLUSTERS AEROBIC BOTTLE ONLY CRITICAL RESULT CALLED TO, READ BACK BY AND VERIFIED WITH: Karlene Einstein, PHARM AT [place] [id number] BY Rhea Bleacher    Culture   Final    GRAM POSITIVE COCCI CULTURE REINCUBATED FOR BETTER GROWTH    Report Status PENDING  Incomplete     Labs: BNP (last 3 results) No results for input(s): BNP in the last 8760 hours. Basic Metabolic Panel:  Recent Labs Lab 06/23/16 0300 06/24/16 0509  06/25/16 0737 06/25/16 0828 06/26/16 0512 06/27/16 0426 06/28/16 0345 06/29/16 0349  NA 143 144  < > 144  --  146* 142 139 137  K 4.2 3.5  < > 4.8 4.7 4.5 4.3 4.2 4.2  CL 114* 115*  < > 116*  --  116* 118* 111 109  CO2 18* 22  < > 20*  --  19* 18* 20* 20*  GLUCOSE 88 94  < > 69  --  81 99 102* 90  BUN 36* 31*  < > 38*  --  36* 37* 35* 32*   CREATININE 1.72* 1.93*  < > 2.85*  --  2.69* 2.62* 2.46* 2.39*  CALCIUM 8.3* 8.3*  < > 8.0*  --  8.1* 7.8* 7.7* 7.9*  MG 1.9 1.9  --   --   --  1.9  --   --   --   PHOS 3.6  --   --   --   --  5.2*  --   --   --   < > = values in this interval not displayed. Liver Function Tests:  Recent Labs Lab 06/22/16 1240 06/23/16 0300 06/24/16 0509 06/26/16 0512  AST 23 20 15   --   ALT 9* 9* 7*  --   ALKPHOS 106 94 86  --   BILITOT 0.7 0.6 0.7  --   PROT 6.3* 5.7* 5.5*  --   ALBUMIN 2.8* 2.3* 2.1* 2.0*   No results for input(s): LIPASE, AMYLASE in the last 168 hours. No results for input(s): AMMONIA in the last 168 hours. CBC:  Recent Labs Lab 06/22/16 1240  06/25/16 0737  06/26/16 0512 06/26/16 1430 06/27/16 0426 06/28/16 0345 06/29/16 0349  WBC 12.1*  < > 8.5  --  11.1*  --  8.8 7.2 6.0  NEUTROABS 9.6*  --   --   --   --   --   --   --   --   HGB 10.2*  < > 6.5*  < > 9.4* 9.4* 8.4* 8.5* 8.9*  HCT 32.9*  < > 21.4*  < > 28.8* 29.8* 26.5* 27.1* 27.7*  MCV 92.9  < > 93.4  --  89.4  --  90.1 89.7 89.1  PLT 231  < > 166  --  161  --  148* 168 180  < > = values in this interval not displayed. Cardiac Enzymes: No results for input(s): CKTOTAL, CKMB, CKMBINDEX, TROPONINI in the last 168 hours. BNP: Invalid input(s): POCBNP CBG: No results for input(s): GLUCAP in the last 168 hours. D-Dimer No results for input(s): DDIMER in the last 72 hours. Hgb A1c No results for input(s): HGBA1C in the last 72 hours. Lipid  Profile No results for input(s): CHOL, HDL, LDLCALC, TRIG, CHOLHDL, LDLDIRECT in the last 72 hours. Thyroid function studies No results for input(s): TSH, T4TOTAL, T3FREE, THYROIDAB in the last 72 hours.  Invalid input(s): FREET3 Anemia work up No results for input(s): VITAMINB12, FOLATE, FERRITIN, TIBC, IRON, RETICCTPCT in the last 72 hours. Urinalysis    Component Value Date/Time   COLORURINE YELLOW 06/25/2016 1810   APPEARANCEUR TURBID* 06/25/2016 1810   LABSPEC  1.018 06/25/2016 1810   PHURINE 5.0 06/25/2016 1810   GLUCOSEU NEGATIVE 06/25/2016 1810   HGBUR NEGATIVE 06/25/2016 1810   BILIRUBINUR SMALL* 06/25/2016 1810   KETONESUR 15* 06/25/2016 1810   PROTEINUR >300* 06/25/2016 1810   UROBILINOGEN 0.2 01/25/2014 2110   NITRITE NEGATIVE 06/25/2016 1810   LEUKOCYTESUR MODERATE* 06/25/2016 1810   Sepsis Labs Invalid input(s): PROCALCITONIN,  WBC,  LACTICIDVEN Microbiology Recent Results (from the past 240 hour(s))  Blood Culture (routine x 2)     Status: Abnormal   Collection Time: 06/22/16  2:01 PM  Result Value Ref Range Status   Specimen Description BLOOD LEFT WRIST  Final   Special Requests BOTTLES DRAWN AEROBIC ONLY 5CC  Final   Culture  Setup Time   Final    GRAM POSITIVE RODS AEROBIC BOTTLE ONLY CRITICAL VALUE NOTED.  VALUE IS CONSISTENT WITH PREVIOUSLY REPORTED AND CALLED VALUE.    Culture (A)  Final    DIPHTHEROIDS(CORYNEBACTERIUM SPECIES) Standardized susceptibility testing for this organism is not available.    Report Status 06/27/2016 FINAL  Final  Blood Culture (routine x 2)     Status: Abnormal   Collection Time: 06/22/16  2:05 PM  Result Value Ref Range Status   Specimen Description BLOOD LEFT HAND  Final   Special Requests BOTTLES DRAWN AEROBIC ONLY 5CC  Final   Culture  Setup Time   Final    GRAM POSITIVE RODS AEROBIC BOTTLE ONLY CRITICAL RESULT CALLED TO, READ BACK BY AND VERIFIED WITH: M TURNER,PHARMD AT 1522 06/24/16 BY L BENFIELD    Culture (A)  Final    DIPHTHEROIDS(CORYNEBACTERIUM SPECIES) Standardized susceptibility testing for this organism is not available.    Report Status 06/26/2016 FINAL  Final  Blood Culture ID Panel (Reflexed)     Status: None   Collection Time: 06/22/16  2:05 PM  Result Value Ref Range Status   Enterococcus species NOT DETECTED NOT DETECTED Final   Vancomycin resistance NOT DETECTED NOT DETECTED Final   Listeria monocytogenes NOT DETECTED NOT DETECTED Final   Staphylococcus species  NOT DETECTED NOT DETECTED Final   Staphylococcus aureus NOT DETECTED NOT DETECTED Final   Methicillin resistance NOT DETECTED NOT DETECTED Final   Streptococcus species NOT DETECTED NOT DETECTED Final   Streptococcus agalactiae NOT DETECTED NOT DETECTED Final   Streptococcus pneumoniae NOT DETECTED NOT DETECTED Final   Streptococcus pyogenes NOT DETECTED NOT DETECTED Final   Acinetobacter baumannii NOT DETECTED NOT DETECTED Final   Enterobacteriaceae species NOT DETECTED NOT DETECTED Final   Enterobacter cloacae complex NOT DETECTED NOT DETECTED Final   Escherichia coli NOT DETECTED NOT DETECTED Final   Klebsiella oxytoca NOT DETECTED NOT DETECTED Final   Klebsiella pneumoniae NOT DETECTED NOT DETECTED Final   Proteus species NOT DETECTED NOT DETECTED Final   Serratia marcescens NOT DETECTED NOT DETECTED Final   Carbapenem resistance NOT DETECTED NOT DETECTED Final   Haemophilus influenzae NOT DETECTED NOT DETECTED Final   Neisseria meningitidis NOT DETECTED NOT DETECTED Final   Pseudomonas aeruginosa NOT DETECTED NOT DETECTED Final  Candida albicans NOT DETECTED NOT DETECTED Final   Candida glabrata NOT DETECTED NOT DETECTED Final   Candida krusei NOT DETECTED NOT DETECTED Final   Candida parapsilosis NOT DETECTED NOT DETECTED Final   Candida tropicalis NOT DETECTED NOT DETECTED Final  Urine culture     Status: Abnormal   Collection Time: 06/22/16  3:52 PM  Result Value Ref Range Status   Specimen Description URINE, CATHETERIZED  Final   Special Requests NONE  Final   Culture MULTIPLE SPECIES PRESENT, SUGGEST RECOLLECTION (A)  Final   Report Status 06/23/2016 FINAL  Final  MRSA PCR Screening     Status: None   Collection Time: 06/23/16  1:03 AM  Result Value Ref Range Status   MRSA by PCR NEGATIVE NEGATIVE Final    Comment:        The GeneXpert MRSA Assay (FDA approved for NASAL specimens only), is one component of a comprehensive MRSA colonization surveillance program. It  is not intended to diagnose MRSA infection nor to guide or monitor treatment for MRSA infections.   Urine culture     Status: Abnormal   Collection Time: 06/25/16  6:11 PM  Result Value Ref Range Status   Specimen Description URINE, CATHETERIZED  Final   Special Requests NONE  Final   Culture >=100,000 COLONIES/mL KLEBSIELLA PNEUMONIAE (A)  Final   Report Status 06/28/2016 FINAL  Final   Organism ID, Bacteria KLEBSIELLA PNEUMONIAE (A)  Final      Susceptibility   Klebsiella pneumoniae - MIC*    AMPICILLIN >=32 RESISTANT Resistant     CEFAZOLIN >=64 RESISTANT Resistant     CEFTRIAXONE >=64 RESISTANT Resistant     CIPROFLOXACIN >=4 RESISTANT Resistant     GENTAMICIN <=1 SENSITIVE Sensitive     IMIPENEM <=0.25 SENSITIVE Sensitive     NITROFURANTOIN 64 INTERMEDIATE Intermediate     TRIMETH/SULFA >=320 RESISTANT Resistant     AMPICILLIN/SULBACTAM >=32 RESISTANT Resistant     PIP/TAZO 8 SENSITIVE Sensitive     * >=100,000 COLONIES/mL KLEBSIELLA PNEUMONIAE  Culture, blood (routine x 2)     Status: Abnormal (Preliminary result)   Collection Time: 06/26/16 10:00 AM  Result Value Ref Range Status   Specimen Description BLOOD LEFT HAND  Final   Special Requests BOTTLES DRAWN AEROBIC ONLY 5CC  Final   Culture  Setup Time   Final    GRAM POSITIVE COCCI IN CLUSTERS AEROBIC BOTTLE ONLY CRITICAL RESULT CALLED TO, READ BACK BY AND VERIFIED WITH: Wynona Neat, PHARM AT [id number] ON [id number] BY M. KELLY    Culture STAPHYLOCOCCUS SPECIES (COAGULASE NEGATIVE) (A)  Final   Report Status PENDING  Incomplete  Blood Culture ID Panel (Reflexed)     Status: Abnormal   Collection Time: 06/26/16 10:00 AM  Result Value Ref Range Status   Enterococcus species NOT DETECTED NOT DETECTED Final   Vancomycin resistance NOT DETECTED NOT DETECTED Final   Listeria monocytogenes NOT DETECTED NOT DETECTED Final   Staphylococcus species DETECTED (A) NOT DETECTED Final    Comment: CRITICAL RESULT CALLED TO, READ BACK BY  AND VERIFIED WITH: VERONDA BRYK,PHARMD @0635  06/27/16 MKELLY    Staphylococcus aureus NOT DETECTED NOT DETECTED Final   Methicillin resistance DETECTED (A) NOT DETECTED Final    Comment: CRITICAL RESULT CALLED TO, READ BACK BY AND VERIFIED WITH: VERONDA BRYK, PHARMD @0635  06/27/16 MKELLY    Streptococcus species NOT DETECTED NOT DETECTED Final   Streptococcus agalactiae NOT DETECTED NOT DETECTED Final   Streptococcus pneumoniae NOT DETECTED NOT DETECTED  Final   Streptococcus pyogenes NOT DETECTED NOT DETECTED Final   Acinetobacter baumannii NOT DETECTED NOT DETECTED Final   Enterobacteriaceae species NOT DETECTED NOT DETECTED Final   Enterobacter cloacae complex NOT DETECTED NOT DETECTED Final   Escherichia coli NOT DETECTED NOT DETECTED Final   Klebsiella oxytoca NOT DETECTED NOT DETECTED Final   Klebsiella pneumoniae NOT DETECTED NOT DETECTED Final   Proteus species NOT DETECTED NOT DETECTED Final   Serratia marcescens NOT DETECTED NOT DETECTED Final   Carbapenem resistance NOT DETECTED NOT DETECTED Final   Haemophilus influenzae NOT DETECTED NOT DETECTED Final   Neisseria meningitidis NOT DETECTED NOT DETECTED Final   Pseudomonas aeruginosa NOT DETECTED NOT DETECTED Final   Candida albicans NOT DETECTED NOT DETECTED Final   Candida glabrata NOT DETECTED NOT DETECTED Final   Candida krusei NOT DETECTED NOT DETECTED Final   Candida parapsilosis NOT DETECTED NOT DETECTED Final   Candida tropicalis NOT DETECTED NOT DETECTED Final  Culture, blood (routine x 2)     Status: None (Preliminary result)   Collection Time: 06/26/16 10:10 AM  Result Value Ref Range Status   Specimen Description BLOOD RIGHT HAND  Final   Special Requests BOTTLES DRAWN AEROBIC ONLY 5CC  Final   Culture  Setup Time   Final    GRAM POSITIVE COCCI IN CLUSTERS AEROBIC BOTTLE ONLY CRITICAL RESULT CALLED TO, READ BACK BY AND VERIFIED WITH: NOneita Hurt, PHARM AT 0805 ON [id number] BY Rhea Bleacher    Culture   Final     GRAM POSITIVE COCCI CULTURE REINCUBATED FOR BETTER GROWTH    Report Status PENDING  Incomplete     Time coordinating discharge: Over 30 minutes  SIGNED:   Elmarie Shiley, MD  [facility] 06/29/2016, 8:09 AM Pager [phone]  If 7PM-7AM, please contact night-coverage [email or web address] Password [email or web address]

## 2016-06-29 NOTE — Progress Notes (Addendum)
[id number]-GIP RN Liaison Note   This is a GIP, related, covered hospital admission of 06/22/16 per Dr. Shelbie Proctor, with a [facility] Dx of cerebrovascular disease. Patient is now a PARTIAL CODE.  Patient seen in room, laying in bed. No family present at time of visit. Patient sleeping soundly, however, arouses easily to voice. Patient appears comfortable and denies any pain.She continues on RA with O2 sats in the upper 90's. She has received her course of IV antibiotics and the plan is to discharge back to [facility] today with the continued support of hospice. She ate approximately 25% of her breakfast tray. She has not required any PRN pain, blood pressure or seizure medication in the past 24 hours, per chart review.  [facility] will continue to follow patient. Please call with any hospice-related questions or concerns.  Freddi Starr, RN, [facility] Liaison  [phone]

## 2016-06-29 NOTE — Progress Notes (Signed)
Pt for trans to [facility]. Family at bedside. Report called to [facility]. Pt in no distress

## 2016-06-29 NOTE — Progress Notes (Signed)
Subjective: Interval History: has no complaint of seizures..  Objective: Vital signs in last 24 hours: Temp:  [97.8 F (36.6 C)-98.7 F (37.1 C)] 98.3 F (36.8 C) (07/22 [id number]) Pulse Rate:  [60-71] 67 (07/22 0608) Resp:  [20-21] 21 (07/22 0608) BP: (127-154)/(46-75) 127/75 mmHg (07/22 0944) SpO2:  [97 %-98 %] 97 % (07/22 0608) Weight:  [80.377 kg (177 lb 3.2 oz)] 80.377 kg (177 lb 3.2 oz) (07/22 [id number])  Intake/Output from previous day: 07/21 0701 - 07/22 0700 In: 1957.1 [P.O.:150; I.V.:1507.1; IV Piggyback:300] Out: 0  Intake/Output this shift: Total I/O In: 120 [P.O.:120] Out: -  Nutritional status: DIET - DYS 1 Room service appropriate?: Yes; Fluid consistency:: Nectar Thick Diet - low sodium heart healthy  Neurologic Exam: Awake, alert. Slow to respond but following commands. PERL, EOMI, Face symmetrical, Tongue midline Moves all 4 extremities but slow.   Lab Results:  Recent Labs  06/28/16 0345 06/29/16 0349  WBC 7.2 6.0  HGB 8.5* 8.9*  HCT 27.1* 27.7*  PLT 168 180  NA 139 137  K 4.2 4.2  CL 111 109  CO2 20* 20*  GLUCOSE 102* 90  BUN 35* 32*  CREATININE 2.46* 2.39*  CALCIUM 7.7* 7.9*   Lipid Panel No results for input(s): CHOL, TRIG, HDL, CHOLHDL, VLDL, LDLCALC in the last 72 hours.  Studies/Results: Dg Swallowing Func-speech Pathology  06/28/2016  Objective Swallowing Evaluation: Type of Study: MBS-Modified Barium Swallow Study Patient Details Name: MAZEL IIDA MRN: [id number] Date of Birth: [birth year] Today's Date: 06/28/2016 Time: SLP Start Time (ACUTE ONLY): 1550-SLP Stop Time (ACUTE ONLY): 1610 SLP Time Calculation (min) (ACUTE ONLY): 20 min Past Medical History: Past Medical History Diagnosis Date . Asthma  . Bronchitis  . Allergic rhinitis  . HTN (hypertension)  . Hyperlipidemia  . Obesity  . OSA (obstructive sleep apnea)    poor compliance with cpap . CHF (congestive heart failure) ([place])  . Biventricular failure ([place])    compensated . Psoriasis  .  Stasis edema    of lower extremities . Depression  . Situational stress  . Anemia  . Gout  . Chronic anticoagulation    off due to GIB, thigh hematoma . Yeast infection involving the vagina and surrounding area  . Atrial fibrillation with RVR ([place])  . GERD (gastroesophageal reflux disease)  . Chronic bronchitis ([place])    "get it q yr" (08/10/2013) . Shortness of breath    "all the time" (08/10/2013) . On home oxygen therapy    "2L during the night and prn during the day" (08/10/2013) . Type II diabetes mellitus ([place])  . History of blood transfusion    "few during my lifetime; last time was 4 days ago when I got 2 units" (08/10/2013) . Stroke [facility]) ?2007   denies residuals on 08/10/2013 . DJD (degenerative joint disease)  . Bleeding    "from my skin folds; just won't stop" (08/10/2013) . Complication of anesthesia    "they gave me too much; I stayed out of it for awhile" (08/10/2013) . Anxiety  . Kidney stones  . Kidney disease, chronic, stage III (GFR 30-59 ml/min)  . Chronic kidney disease, stage IV (severe) ([place])  . Morbid obesity ([place])  . Diastolic CHF ([place])  . Pneumonia    "said I did on 08/03/2013 then they ruled it out" (08/10/2013) . HCAP (healthcare-associated pneumonia) 01/03/2016 . Seizures ([place])  Past Surgical History: Past Surgical History Procedure Laterality Date . Appendectomy   . Cholecystectomy   . Total knee arthroplasty  Left ~ 2006 . Total abdominal hysterectomy   . Back surgery   . Cystectomy Left    hand . Tonsillectomy   . Joint replacement   . Lumbar disc surgery   . Colonoscopy N/A 08/16/2013   Procedure: COLONOSCOPY;  Surgeon: Beryle Beams, MD;  Location: [place];  Service: Endoscopy;  Laterality: N/A; . Hematoma evacuation Right 09/10/2013   Procedure: EVACUATION OF RIGHT LEG HEMATOMA AND EXCISION [place] RIGHT LEG ;  Surgeon: Merrie Roof, MD;  [facility];  Service: General;  Laterality: Right; . Debridement and closure wound Right 09/22/2013   Procedure: IRRIGATION AND DEBRIDEMENT SURGICAL  PREP PLACEMENT OF [place]  ;  Surgeon: Irene Limbo, MD;  [facility];  Service: Plastics;  Laterality: Right; . Skin split graft Right 10/07/2013   Procedure: SPLIT THICKNESS SKIN GRAFT RIGHT THIGH TO RIGHT LEG, PLACEMENT OF A CEL AND WOUND VAC TO RIGHT THIGH  ;  Surgeon: Irene Limbo, MD;  [facility];  Service: Plastics;  Laterality: Right; HPI: ARISHA NOORDA is a 65 y.o. 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 She's been living in the nursing home for 3 years now and is full assist for all ADLs, w/ recent dx in 1/17 of seizure disorder, with PMHx of atrial fibrillation, history of hypertension, chronic 2 L n/c at NH, and chronic kidney disease, presents to our hospital for seizure. Chest x ray reveals Mild asymmetric increased opacification within the right upper lobe and right midlung which may reflect underlying aspiration, Subjective: Pt resting in bed. Husband present. He reports pt was tolerating chopped solids and thin liquids prior to admit. Assessment / Plan / Recommendation CHL IP CLINICAL IMPRESSIONS 06/28/2016 Therapy Diagnosis Mild oral phase dysphagia;Mild pharyngeal phase dysphagia Clinical Impression Pt currently with a mild sensory oropharyngeal dysphagia. Pt with poor bolus containment with premature spillage to the level of the pyriform sinuses across consistencies, resulting in flash penetration of thin liquids just prior to pharyngeal swallow initiation. No penetration/ aspiration noted with nectar-thick liquids. Pt with excellent hyolaryngeal excursion and no pharyngeal residue noted post-swallow. Across consistencies pt had piecemeal swallowing, occasionally requiring cues to swallow a 2nd time as liquid/ puree pooled from oral cavity into the vallecula post-swallow. Pt has had a wet/ congested cough all day today; however, it is worth noting that pt coughed post-swallow during study with no penetration/ aspiration noted.  Recommend re-initiating dysphagia 1 diet (due to piecemeal swallowing and needing cues to swallow oral residuals), nectar thick liquids, meds crushed in puree, full supervision to cue small bites/ sips, cue pt to swallow 2 times with each bolus to clear oral residuals. Ensure pt fully upright and alert during all PO intake. Will continue to follow to check diet tolerance. Impact on safety and function Mild aspiration risk;Moderate aspiration risk   CHL IP TREATMENT RECOMMENDATION 06/28/2016 Treatment Recommendations Therapy as outlined in treatment plan below   Prognosis 06/28/2016 Prognosis for Safe Diet Advancement Fair Barriers to Reach Goals Cognitive deficits;Severity of deficits Barriers/Prognosis Comment -- CHL IP DIET RECOMMENDATION 06/28/2016 SLP Diet Recommendations Dysphagia 1 (Puree) solids;Nectar thick liquid Liquid Administration via Straw Medication Administration Crushed with puree Compensations Minimize environmental distractions;Slow rate;Small sips/bites;Multiple dry swallows after each bite/sip;Other (Comment) Postural Changes Seated upright at 90 degrees;Remain semi-upright after after feeds/meals (Comment)   CHL IP OTHER RECOMMENDATIONS 06/28/2016 Recommended Consults -- Oral Care Recommendations Oral care BID Other Recommendations Have oral suction available   CHL IP  FOLLOW UP RECOMMENDATIONS 06/28/2016 Follow up Recommendations 24 hour supervision/assistance   CHL IP FREQUENCY AND DURATION 06/28/2016 Speech Therapy Frequency (ACUTE ONLY) min 2x/week Treatment Duration 1 week      CHL IP ORAL PHASE 06/28/2016 Oral Phase Impaired Oral - Pudding Teaspoon -- Oral - Pudding Cup -- Oral - Honey Teaspoon -- Oral - Honey Cup -- Oral - Nectar Teaspoon -- Oral - Nectar Cup -- Oral - Nectar Straw Piecemeal swallowing;Premature spillage;Delayed oral transit Oral - Thin Teaspoon -- Oral - Thin Cup -- Oral - Thin Straw Delayed oral transit;Premature spillage Oral - Puree Lingual/palatal residue;Piecemeal  swallowing;Premature spillage Oral - Mech Soft -- Oral - Regular Impaired mastication;Piecemeal swallowing;Lingual/palatal residue;Premature spillage Oral - Multi-Consistency -- Oral - Pill -- Oral Phase - Comment --  CHL IP PHARYNGEAL PHASE 06/28/2016 Pharyngeal Phase Impaired Pharyngeal- Pudding Teaspoon -- Pharyngeal -- Pharyngeal- Pudding Cup -- Pharyngeal -- Pharyngeal- Honey Teaspoon -- Pharyngeal -- Pharyngeal- Honey Cup -- Pharyngeal -- Pharyngeal- Nectar Teaspoon -- Pharyngeal -- Pharyngeal- Nectar Cup -- Pharyngeal -- Pharyngeal- Nectar Straw Delayed swallow initiation-pyriform sinuses Pharyngeal -- Pharyngeal- Thin Teaspoon -- Pharyngeal -- Pharyngeal- Thin Cup -- Pharyngeal -- Pharyngeal- Thin Straw Delayed swallow initiation-pyriform sinuses;Penetration/Aspiration before swallow Pharyngeal Material enters airway, remains ABOVE vocal cords then ejected out Pharyngeal- Puree Delayed swallow initiation-pyriform sinuses Pharyngeal -- Pharyngeal- Mechanical Soft -- Pharyngeal -- Pharyngeal- Regular Delayed swallow initiation-pyriform sinuses Pharyngeal -- Pharyngeal- Multi-consistency -- Pharyngeal -- Pharyngeal- Pill -- Pharyngeal -- Pharyngeal Comment --  CHL IP CERVICAL ESOPHAGEAL PHASE 06/28/2016 Cervical Esophageal Phase WFL Pudding Teaspoon -- Pudding Cup -- Honey Teaspoon -- Honey Cup -- Nectar Teaspoon -- Nectar Cup -- Nectar Straw -- Thin Teaspoon -- Thin Cup -- Thin Straw -- Puree -- Mechanical Soft -- Regular -- Multi-consistency -- Pill -- Cervical Esophageal Comment -- No flowsheet data found. Kern Reap, MA, CCC-SLP 06/28/2016, 4:40 PM [phone]              Medications:  Scheduled: . sodium chloride   Intravenous Once  . allopurinol  100 mg Oral Daily  . amLODipine  10 mg Oral QHS  . atorvastatin  20 mg Oral QHS  . divalproex  500 mg Oral Q8H  . famotidine  20 mg Oral Daily  . hydrALAZINE  100 mg Oral Q8H  . isosorbide dinitrate  30 mg Oral BID  . lacosamide  100 mg Oral BID  .  levETIRAcetam  500 mg Oral BID  . metoCLOPramide  5 mg Oral TID AC  . metoprolol tartrate  25 mg Oral BID  . polyethylene glycol  17 g Oral BID  . sodium bicarbonate  650 mg Oral BID  . sodium chloride flush  3 mL Intravenous Q12H  . traZODone  50 mg Oral QHS  . 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  Will attempt to wean off slowly one AED to make her more awake in the near future.     LOS: 6 days   Rogue Jury, MD 06/29/2016  10:34 AM

## 2016-06-29 NOTE — Clinical Social Work Note (Signed)
Per MD patient ready to DC back to [facility] with [facility]. RN, patient/family Secundino Ginger at bedside), and facility notified of patient's DC. RN given number for report. DC packet on patient's chart. Ambulance transport requested for patient for 4PM. CSW signing off at this time.   Liz Beach MSW, [place], [place], [id number]

## 2016-06-30 LAB — CULTURE, BLOOD (ROUTINE X 2)

## 2016-07-25 ENCOUNTER — Emergency Department (HOSPITAL_COMMUNITY)

## 2016-07-25 ENCOUNTER — Encounter (HOSPITAL_COMMUNITY): Payer: Self-pay | Admitting: Emergency Medicine

## 2016-07-25 ENCOUNTER — Emergency Department (HOSPITAL_COMMUNITY)
Admission: EM | Admit: 2016-07-25 | Discharge: 2016-07-26 | Disposition: A | Discharge status: Home / Self Care | Attending: Emergency Medicine | Admitting: Emergency Medicine

## 2016-07-25 DIAGNOSIS — Z87891 Personal history of nicotine dependence: Secondary | ICD-10-CM | POA: Insufficient documentation

## 2016-07-25 DIAGNOSIS — Z91040 Latex allergy status: Secondary | ICD-10-CM | POA: Diagnosis not present

## 2016-07-25 DIAGNOSIS — I5032 Chronic diastolic (congestive) heart failure: Secondary | ICD-10-CM | POA: Insufficient documentation

## 2016-07-25 DIAGNOSIS — I132 Hypertensive heart and chronic kidney disease with heart failure and with stage 5 chronic kidney disease, or end stage renal disease: Secondary | ICD-10-CM | POA: Insufficient documentation

## 2016-07-25 DIAGNOSIS — E1143 Type 2 diabetes mellitus with diabetic autonomic (poly)neuropathy: Secondary | ICD-10-CM | POA: Insufficient documentation

## 2016-07-25 DIAGNOSIS — R0602 Shortness of breath: Secondary | ICD-10-CM | POA: Diagnosis present

## 2016-07-25 DIAGNOSIS — N186 End stage renal disease: Secondary | ICD-10-CM | POA: Diagnosis not present

## 2016-07-25 DIAGNOSIS — Z96642 Presence of left artificial hip joint: Secondary | ICD-10-CM | POA: Diagnosis not present

## 2016-07-25 DIAGNOSIS — J441 Chronic obstructive pulmonary disease with (acute) exacerbation: Secondary | ICD-10-CM | POA: Insufficient documentation

## 2016-07-25 DIAGNOSIS — Z8673 Personal history of transient ischemic attack (TIA), and cerebral infarction without residual deficits: Secondary | ICD-10-CM | POA: Insufficient documentation

## 2016-07-25 NOTE — ED Provider Notes (Signed)
[facility] DEPT Provider Note   CSN: [id number] Arrival date & time: 07/25/16  2159     History   Chief Complaint Chief Complaint  Patient presents with  . Shortness of Breath    HPI [name] is a 65 y.o. female.  Patient arrives via EMS from [place] home, visit instigated by husband for apparent SOB and difficulty breathing. The patient does not complain of pain. She has a history of diastolic CHF, COPD, ESRD (not on HD), atrial fibrillation with RVR no longer anticoagulated secondary to GI bleeding, on hospice care, here with husband and son at bedside reporting desire for comfort care only, specifically no intubations or resuscitation.    The history is provided by the spouse and a relative. No language interpreter was used.  Shortness of Breath  This is a recurrent problem.    Past Medical History:  Diagnosis Date  . Allergic rhinitis   . Anemia   . Anxiety   . Asthma   . Atrial fibrillation with RVR ([place])   . Biventricular failure ([place])    compensated  . Bleeding    "from my skin folds; just won't stop" (08/10/2013)  . Bronchitis   . CHF (congestive heart failure) ([place])   . Chronic anticoagulation    off due to GIB, thigh hematoma  . Chronic bronchitis ([place])    "get it q yr" (08/10/2013)  . Chronic kidney disease, stage IV (severe) ([place])   . Complication of anesthesia    "they gave me too much; I stayed out of it for awhile" (08/10/2013)  . Depression   . Diastolic CHF ([place])   . DJD (degenerative joint disease)   . GERD (gastroesophageal reflux disease)   . Gout   . HCAP (healthcare-associated pneumonia) 01/03/2016  . History of blood transfusion    "few during my lifetime; last time was 4 days ago when I got 2 units" (08/10/2013)  . HTN (hypertension)   . Hyperlipidemia   . Kidney disease, chronic, stage III (GFR 30-59 ml/min)   . Kidney stones   . Morbid obesity ([place])   . Obesity   . On home oxygen therapy    "2L during the night and  prn during the day" (08/10/2013)  . OSA (obstructive sleep apnea)    poor compliance with cpap  . Pneumonia    "said I did on 08/03/2013 then they ruled it out" (08/10/2013)  . Psoriasis   . Seizures ([place])   . Shortness of breath    "all the time" (08/10/2013)  . Situational stress   . Stasis edema    of lower extremities  . Stroke [facility]) ?2007   denies residuals on 08/10/2013  . Type II diabetes mellitus ([place])   . Yeast infection involving the vagina and surrounding area     Patient Active Problem List   Diagnosis Date Noted  . Malnutrition of moderate degree 06/26/2016  . Pericardial effusion 06/25/2016  . Acute kidney injury superimposed on CKD ([place]) 06/25/2016  . PRES (posterior reversible encephalopathy syndrome): Possible 06/25/2016  . Hematoma of left lower extremity 06/24/2016  . SVT (supraventricular tachycardia) ([place]) 06/23/2016  . Status epilepticus ([place]) 06/22/2016  . Total self-care deficit 06/22/2016  . AKI (acute kidney injury) ([place]) 06/22/2016  . CKD (chronic kidney disease) stage 3, GFR 30-59 ml/min 06/22/2016  . Malignant HTN with heart disease, w/o CHF, with chronic kidney disease 06/22/2016  . Encounter for palliative care   . Goals of care, counseling/discussion   .  HCAP (healthcare-associated pneumonia) 01/03/2016  . Seizure ([place]) 01/03/2016  . Aspiration pneumonia ([place]) 01/03/2016  . Essential hypertension 01/03/2016  . End of life care 01/03/2016  . Hyperglycemia 01/03/2016  . CKD (chronic kidney disease) 01/03/2016  . Immobility 12/14/2014  . Chronic airway obstruction, not elsewhere classified 09/07/2014  . Anemia in chronic kidney disease(285.21) 08/31/2014  . Candidiasis of skin 06/23/2014  . Congestive heart disease ([place]) 06/21/2014  . Type 2 diabetes mellitus with other diabetic kidney complication ([place]) 123XX123  . Insomnia 05/16/2014  . Gastroparesis 04/22/2014  . Anxiety state, unspecified 04/22/2014  . Obesity hypoventilation syndrome ([place])  02/05/2014  . Chronic diastolic CHF (congestive heart failure) ([place]) 02/05/2014  . Acute systolic heart failure ([place]) 01/26/2014  . Hypernatremia 12/10/2013  . A-fib ([place]) 12/03/2013  . Chronic anemia 11/17/2013  . Gout 11/17/2013  . Obesity 11/17/2013  . OSA (obstructive sleep apnea) 11/17/2013  . Nausea alone 11/14/2013  . Wound of right leg 11/14/2013  . Unspecified constipation 11/12/2013  . Acute respiratory failure ([place]) 11/10/2013  . CHF (congestive heart failure) ([place]) 11/09/2013  . SOB (shortness of breath) 10/19/2013  . 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 Hematoma complicating a procedure 10/15/2013  . Bleeding into the skin 08/10/2013  . Bright red rectal bleeding 08/10/2013  . PAF (paroxysmal atrial fibrillation) ([place]) 07/05/2013  . Psoriasis 07/05/2013  . Chronic combined systolic and diastolic CHF (congestive heart failure) ([place]) 07/05/2013  . CKD (chronic kidney disease), stage IV ([place]) 08/15/2012  . Anemia of chronic disease 08/15/2012  . DM (diabetes mellitus), type 2 with renal complications ([place]) [id number]  . ATRIAL FIBRILLATION WITH RAPID VENTRICULAR RESPONSE 10/17/2010  . Acute combined systolic and diastolic heart failure ([place]) 10/17/2010  . HYPERLIPIDEMIA 03/18/2010  . HYPERTENSION 03/18/2010  . C V A / STROKE 03/18/2010  . OBSTRUCTIVE SLEEP APNEA 03/15/2010    Past Surgical History:  Procedure Laterality Date  . APPENDECTOMY    . BACK SURGERY    . CHOLECYSTECTOMY    . COLONOSCOPY N/A 08/16/2013   Procedure: COLONOSCOPY;  Surgeon: Beryle Beams, MD;  Location: [facility];  Service: Endoscopy;  Laterality: N/A;  . CYSTECTOMY Left    hand  . DEBRIDEMENT AND CLOSURE WOUND Right 09/22/2013   Procedure: IRRIGATION AND DEBRIDEMENT SURGICAL PREP PLACEMENT OF [place] AND VAC  ;  Surgeon: Irene Limbo, MD;  [facility];  Service: Plastics;  Laterality: Right;  . HEMATOMA EVACUATION Right 09/10/2013   Procedure: EVACUATION OF RIGHT  LEG HEMATOMA AND EXCISION [place] RIGHT LEG ;  Surgeon: Merrie Roof, MD;  [facility];  Service: General;  Laterality: Right;  . JOINT REPLACEMENT    . LUMBAR DISC SURGERY    . SKIN SPLIT GRAFT Right 10/07/2013   Procedure: SPLIT THICKNESS SKIN GRAFT RIGHT THIGH TO RIGHT LEG, PLACEMENT OF A CEL AND WOUND VAC TO RIGHT THIGH  ;  Surgeon: Irene Limbo, MD;  [facility];  Service: Plastics;  Laterality: Right;  . TONSILLECTOMY    . TOTAL ABDOMINAL HYSTERECTOMY    . TOTAL KNEE ARTHROPLASTY Left ~ 2006    OB History    No data available       Home Medications    Prior to Admission medications   Medication Sig Start Date End Date Taking? Authorizing Provider  acetaminophen (TYLENOL) 500 MG tablet Take 500 mg by mouth every 6 (six) hours.    Historical Provider, MD  albuterol (PROVENTIL) (2.5 MG/3ML) 0.083% nebulizer solution Take  2.5 mg by nebulization every 6 (six) hours as needed for wheezing or shortness of breath.    Historical Provider, MD  allopurinol (ZYLOPRIM) 100 MG tablet Take 100 mg by mouth daily. Patient takes at 0800am daily 09/16/11   Historical Provider, MD  Amino Acids-Protein Hydrolys (FEEDING SUPPLEMENT, PRO-STAT SUGAR FREE 64,) LIQD Take 30 mLs by mouth 2 (two) times daily.    Historical Provider, MD  amLODipine (NORVASC) 10 MG tablet Take 10 mg by mouth at bedtime.  08/19/13   Thurnell Lose, MD  atorvastatin (LIPITOR) 20 MG tablet Take 20 mg by mouth at bedtime. Patient takes at 2100    Historical Provider, MD  cyanocobalamin (,VITAMIN B-12,) 1000 MCG/ML injection Inject 1,000 mcg into the muscle every 30 (thirty) days.    Historical Provider, MD  divalproex (DEPAKOTE SPRINKLE) 125 MG capsule Take 4 capsules (500 mg total) by mouth every 8 (eight) hours. 06/29/16   Belkys A Regalado, MD  feeding supplement (BOOST / RESOURCE BREEZE) LIQD Take 1 Container by mouth 2 (two) times daily between meals. Reported on 02/23/2016    Historical Provider, MD    ferrous sulfate 325 (65 FE) MG tablet Take 325 mg by mouth 3 (three) times daily with meals.    Historical Provider, MD  hydrALAZINE (APRESOLINE) 100 MG tablet Take 100 mg by mouth 3 (three) times daily.    Historical Provider, MD  isosorbide dinitrate (ISORDIL) 30 MG tablet Take 1 tablet (30 mg total) by mouth 2 (two) times daily. 01/28/14   Charlynne Cousins, MD  lacosamide 100 MG TABS Take 1 tablet (100 mg total) by mouth 2 (two) times daily. 06/29/16   Belkys A Regalado, MD  levETIRAcetam (KEPPRA) 500 MG tablet Take 1 tablet (500 mg total) by mouth 2 (two) times daily. 01/06/16   Thurnell Lose, MD  metoCLOPramide (REGLAN) 5 MG tablet Take 5 mg by mouth 3 (three) times daily.    Historical Provider, MD  metoprolol tartrate (LOPRESSOR) 25 MG tablet Take 1 tablet (25 mg total) by mouth 2 (two) times daily. 06/29/16   Belkys A Regalado, MD  omeprazole (PRILOSEC) 20 MG capsule Take 20 mg by mouth 2 (two) times daily before a meal.     Historical Provider, MD  OXYGEN Inhale 2 L into the lungs continuous.    Historical Provider, MD  Polyethyl Glycol-Propyl Glycol (SYSTANE) 0.4-0.3 % SOLN Apply 1 drop to eye 4 (four) times daily.    Historical Provider, MD  polyethylene glycol (MIRALAX / GLYCOLAX) packet Take 17 g by mouth 2 (two) times daily.    Historical Provider, MD  traZODone (DESYREL) 50 MG tablet Take 50 mg by mouth at bedtime.     Historical Provider, MD  Vitamin D, Ergocalciferol, (DRISDOL) 50000 units CAPS capsule Take 50,000 Units by mouth 3 (three) times a week.    Historical Provider, MD    Family History Family History  Problem Relation Age of Onset  . Heart attack Father   . Asthma Father   . Other Mother     mva  . Heart disease Paternal Uncle   . Rectal cancer Paternal Aunt     Social History Social History  Substance Use Topics  . Smoking status: Former Smoker    Packs/day: 0.12    Years: 12.00    Types: Cigarettes    Quit date: 07/05/1982  . Smokeless tobacco:  Former Systems developer     Comment: smoked for 1.5 yrs about 2 cigs a day ,only if  stressed  . Alcohol use No     Allergies   Daptomycin; Morphine and related; Cephalexin; Tramadol; Atenolol; Cephalosporins; Dapsone; Erythromycin; Erythromycin base; Celecoxib; Codeine; Fluoxetine hcl; Latex; Ofloxacin; Penicillins; Rofecoxib; and Sulfonamide derivatives   Review of Systems Review of Systems  Unable to perform ROS: Patient nonverbal     Physical Exam Updated Vital Signs BP 121/68 (BP Location: Left Arm)   Pulse (!) 122   Temp 98.9 F (37.2 C) (Axillary)   Resp (!) 48   SpO2 100%   Physical Exam  Constitutional: She appears well-developed and well-nourished.  HENT:  Head: Atraumatic.  Neck: Neck supple.  Cardiovascular: Tachycardia present.   Pulmonary/Chest: Tachypnea noted. She has decreased breath sounds.  Prolonged expirations with end expiratory wheeze.  Abdominal: She exhibits no distension. There is no tenderness.  Skin: Skin is warm and dry.     ED Treatments / Results  Labs (all labs ordered are listed, but only abnormal results are displayed) Labs Reviewed - No data to display  EKG  EKG Interpretation None       Radiology No results found.  Procedures Procedures (including critical care time)  Medications Ordered in ED Medications - No data to display   Initial Impression / Assessment and Plan / ED Course  I have reviewed the triage vital signs and the nursing notes.  Pertinent labs & imaging results that were available during my care of the patient were reviewed by me and considered in my medical decision making (see chart for details).  Clinical Course    Patient presents with family who advises comfort care sought for increased breathing difficulties today. No cough. No known fever. Patient is on hospice care, resident at [facility].  Breathing treatment provided with return to baseline breathing status, per husband. She is observed over time  without any return to increased work of breathing.   On further discussion with husband, the patient is felt improved and stable for discharge back to the facility. Patient's husband is comfortable with plan. He reports he will follow up with plan to consult hospice for inpatient terminal care. Patient discharged back to [facility]  Final Clinical Impressions(s) / ED Diagnoses   Final diagnoses:  None  1. COPD  New Prescriptions New Prescriptions   No medications on file     Charlann Lange, Hershal Coria 07/26/16 [place], MD 07/26/16 [phone]

## 2016-07-25 NOTE — ED Notes (Signed)
Sherri PA at bedside

## 2016-07-25 NOTE — ED Triage Notes (Signed)
Pt is a hospice pt who presents from [facility] with dx of CHF, COPD, and ESRD for "breathing changes"; family checked on pt and stated they wanted her transported to [facility] for further eval; pt/ems did not bring DNR form or MOST form; family at bedside requesting comfort care; EMS gave supplemental O2 and 10mg  albuterol and 0.5mg  atrovent; unable to obtain a BP d/t pitting edema to BUE; ST on monitor during transport

## 2016-07-25 NOTE — ED Notes (Signed)
Aerosol mask removed and [place] placed with 2 L supplemental O2

## 2016-07-26 LAB — CBC WITH DIFFERENTIAL/PLATELET
BASOS PCT: 1 %
Basophils Absolute: 0 10*3/uL (ref 0.0–0.1)
EOS ABS: 0.2 10*3/uL (ref 0.0–0.7)
EOS PCT: 4 %
HCT: 28.5 % — ABNORMAL LOW (ref 36.0–46.0)
Hemoglobin: 8.2 g/dL — ABNORMAL LOW (ref 12.0–15.0)
LYMPHS ABS: 1 10*3/uL (ref 0.7–4.0)
Lymphocytes Relative: 23 %
MCH: 29 pg (ref 26.0–34.0)
MCHC: 28.8 g/dL — AB (ref 30.0–36.0)
MCV: 100.7 fL — ABNORMAL HIGH (ref 78.0–100.0)
MONO ABS: 0.4 10*3/uL (ref 0.1–1.0)
MONOS PCT: 9 %
Neutro Abs: 2.7 10*3/uL (ref 1.7–7.7)
Neutrophils Relative %: 63 %
PLATELETS: 195 10*3/uL (ref 150–400)
RBC: 2.83 MIL/uL — ABNORMAL LOW (ref 3.87–5.11)
RDW: 17.3 % — AB (ref 11.5–15.5)
WBC: 4.2 10*3/uL (ref 4.0–10.5)

## 2016-07-26 LAB — URINE MICROSCOPIC-ADD ON: RBC / HPF: NONE SEEN RBC/hpf (ref 0–5)

## 2016-07-26 LAB — URINALYSIS, ROUTINE W REFLEX MICROSCOPIC
BILIRUBIN URINE: NEGATIVE
Glucose, UA: NEGATIVE mg/dL
Hgb urine dipstick: NEGATIVE
KETONES UR: NEGATIVE mg/dL
LEUKOCYTES UA: NEGATIVE
NITRITE: NEGATIVE
Protein, ur: >300 mg/dL — AB
SPECIFIC GRAVITY, URINE: 1.022 (ref 1.005–1.030)
pH: 5 (ref 5.0–8.0)

## 2016-07-26 LAB — COMPREHENSIVE METABOLIC PANEL
ALK PHOS: 78 U/L (ref 38–126)
ALT: <5 U/L — ABNORMAL LOW (ref 14–54)
ANION GAP: 6 (ref 5–15)
AST: 9 U/L — ABNORMAL LOW (ref 15–41)
Albumin: 1.9 g/dL — ABNORMAL LOW (ref 3.5–5.0)
BUN: 60 mg/dL — ABNORMAL HIGH (ref 6–20)
CALCIUM: 8.1 mg/dL — AB (ref 8.9–10.3)
CHLORIDE: 109 mmol/L (ref 101–111)
CO2: 25 mmol/L (ref 22–32)
Creatinine, Ser: 2.81 mg/dL — ABNORMAL HIGH (ref 0.44–1.00)
GFR calc non Af Amer: 16 mL/min — ABNORMAL LOW (ref >60)
GFR, EST AFRICAN AMERICAN: 19 mL/min — AB (ref >60)
Glucose, Bld: 84 mg/dL (ref 65–99)
POTASSIUM: 5.3 mmol/L — AB (ref 3.5–5.1)
SODIUM: 140 mmol/L (ref 135–145)
Total Bilirubin: 0.3 mg/dL (ref 0.3–1.2)
Total Protein: 4.9 g/dL — ABNORMAL LOW (ref 6.5–8.1)

## 2016-07-26 LAB — TROPONIN I: Troponin I: <0.03 ng/mL (ref <0.03)

## 2016-07-26 NOTE — ED Notes (Signed)
Pt departing ER with [facility] on [facility] stretcher; paperwork from facility returned and discharge instructions and results given to [facility] staff to give to SNF staff

## 2016-07-27 LAB — URINE CULTURE: CULTURE: NO GROWTH

## 2016-08-09 DEATH — deceased

## 2016-08-30 ENCOUNTER — Telehealth: Payer: Self-pay | Admitting: *Deleted

## 2016-08-30 ENCOUNTER — Ambulatory Visit: Payer: Medicare Other | Admitting: Diagnostic Neuroimaging

## 2016-08-30 NOTE — Telephone Encounter (Signed)
No showed follow up appointment. 

## 2016-09-02 ENCOUNTER — Encounter: Payer: Self-pay | Admitting: Diagnostic Neuroimaging
